# Patient Record
Sex: Male | Born: 1945 | Race: White | Hispanic: No | Marital: Married | Smoking: Former smoker
Health system: Southern US, Community
[De-identification: ages and names within clinical notes are randomized; demographics above are authoritative.]

## PROBLEM LIST (undated history)

## (undated) DIAGNOSIS — Z789 Other specified health status: Secondary | ICD-10-CM

## (undated) DIAGNOSIS — E119 Type 2 diabetes mellitus without complications: Secondary | ICD-10-CM

## (undated) DIAGNOSIS — N5089 Other specified disorders of the male genital organs: Secondary | ICD-10-CM

## (undated) DIAGNOSIS — Z87442 Personal history of urinary calculi: Secondary | ICD-10-CM

## (undated) DIAGNOSIS — Z859 Personal history of malignant neoplasm, unspecified: Secondary | ICD-10-CM

## (undated) DIAGNOSIS — K449 Diaphragmatic hernia without obstruction or gangrene: Secondary | ICD-10-CM

## (undated) DIAGNOSIS — M1990 Unspecified osteoarthritis, unspecified site: Secondary | ICD-10-CM

## (undated) DIAGNOSIS — R351 Nocturia: Secondary | ICD-10-CM

## (undated) DIAGNOSIS — T7840XA Allergy, unspecified, initial encounter: Secondary | ICD-10-CM

## (undated) DIAGNOSIS — I428 Other cardiomyopathies: Secondary | ICD-10-CM

## (undated) DIAGNOSIS — Z87898 Personal history of other specified conditions: Secondary | ICD-10-CM

## (undated) DIAGNOSIS — I10 Essential (primary) hypertension: Secondary | ICD-10-CM

## (undated) DIAGNOSIS — M722 Plantar fascial fibromatosis: Secondary | ICD-10-CM

## (undated) DIAGNOSIS — K219 Gastro-esophageal reflux disease without esophagitis: Secondary | ICD-10-CM

## (undated) DIAGNOSIS — J309 Allergic rhinitis, unspecified: Secondary | ICD-10-CM

## (undated) DIAGNOSIS — N200 Calculus of kidney: Secondary | ICD-10-CM

## (undated) DIAGNOSIS — I2510 Atherosclerotic heart disease of native coronary artery without angina pectoris: Secondary | ICD-10-CM

## (undated) DIAGNOSIS — E559 Vitamin D deficiency, unspecified: Secondary | ICD-10-CM

## (undated) DIAGNOSIS — E785 Hyperlipidemia, unspecified: Secondary | ICD-10-CM

## (undated) DIAGNOSIS — Z974 Presence of external hearing-aid: Secondary | ICD-10-CM

## (undated) DIAGNOSIS — Z9889 Other specified postprocedural states: Secondary | ICD-10-CM

## (undated) DIAGNOSIS — R55 Syncope and collapse: Secondary | ICD-10-CM

## (undated) DIAGNOSIS — Z973 Presence of spectacles and contact lenses: Secondary | ICD-10-CM

## (undated) HISTORY — DX: Unspecified osteoarthritis, unspecified site: M19.90

## (undated) HISTORY — DX: Allergy, unspecified, initial encounter: T78.40XA

## (undated) HISTORY — DX: Calculus of kidney: N20.0

## (undated) HISTORY — DX: Essential (primary) hypertension: I10

## (undated) HISTORY — PX: SHOULDER SURGERY: SHX246

## (undated) HISTORY — DX: Syncope and collapse: R55

## (undated) HISTORY — PX: TONSILLECTOMY AND ADENOIDECTOMY: SUR1326

## (undated) HISTORY — DX: Atherosclerotic heart disease of native coronary artery without angina pectoris: I25.10

## (undated) HISTORY — DX: Plantar fascial fibromatosis: M72.2

## (undated) HISTORY — PX: UPPER GASTROINTESTINAL ENDOSCOPY: SHX188

## (undated) HISTORY — PX: COLONOSCOPY: SHX174

## (undated) HISTORY — DX: Gastro-esophageal reflux disease without esophagitis: K21.9

## (undated) HISTORY — DX: Allergic rhinitis, unspecified: J30.9

## (undated) HISTORY — PX: OTHER SURGICAL HISTORY: SHX169

## (undated) HISTORY — PX: POLYPECTOMY: SHX149

## (undated) HISTORY — DX: Vitamin D deficiency, unspecified: E55.9

---

## 1999-05-17 ENCOUNTER — Ambulatory Visit (HOSPITAL_COMMUNITY): Admission: RE | Admit: 1999-05-17 | Discharge: 1999-05-17 | Payer: Self-pay | Admitting: Internal Medicine

## 1999-05-17 ENCOUNTER — Encounter: Payer: Self-pay | Admitting: Internal Medicine

## 1999-06-28 ENCOUNTER — Encounter (INDEPENDENT_AMBULATORY_CARE_PROVIDER_SITE_OTHER): Payer: Self-pay

## 1999-06-28 ENCOUNTER — Encounter (INDEPENDENT_AMBULATORY_CARE_PROVIDER_SITE_OTHER): Payer: Self-pay | Admitting: *Deleted

## 1999-06-28 ENCOUNTER — Ambulatory Visit (HOSPITAL_COMMUNITY): Admission: RE | Admit: 1999-06-28 | Discharge: 1999-06-28 | Payer: Self-pay | Admitting: *Deleted

## 2003-05-07 ENCOUNTER — Ambulatory Visit (HOSPITAL_COMMUNITY): Admission: RE | Admit: 2003-05-07 | Discharge: 2003-05-07 | Payer: Self-pay | Admitting: *Deleted

## 2003-05-07 ENCOUNTER — Encounter (INDEPENDENT_AMBULATORY_CARE_PROVIDER_SITE_OTHER): Payer: Self-pay | Admitting: *Deleted

## 2003-05-22 ENCOUNTER — Encounter: Admission: RE | Admit: 2003-05-22 | Discharge: 2003-06-05 | Payer: Self-pay | Admitting: General Surgery

## 2003-05-22 IMAGING — CR DG CHEST 2V
2 series · 2 of 2 positions shown · non-contrast
Comparison: None.

CLINICAL DATA: Villous adenoma of the rectum.  Pre-op evaluation.  

 TWO VIEW CHEST, [DATE]

[view not recorded (1 of 2)]
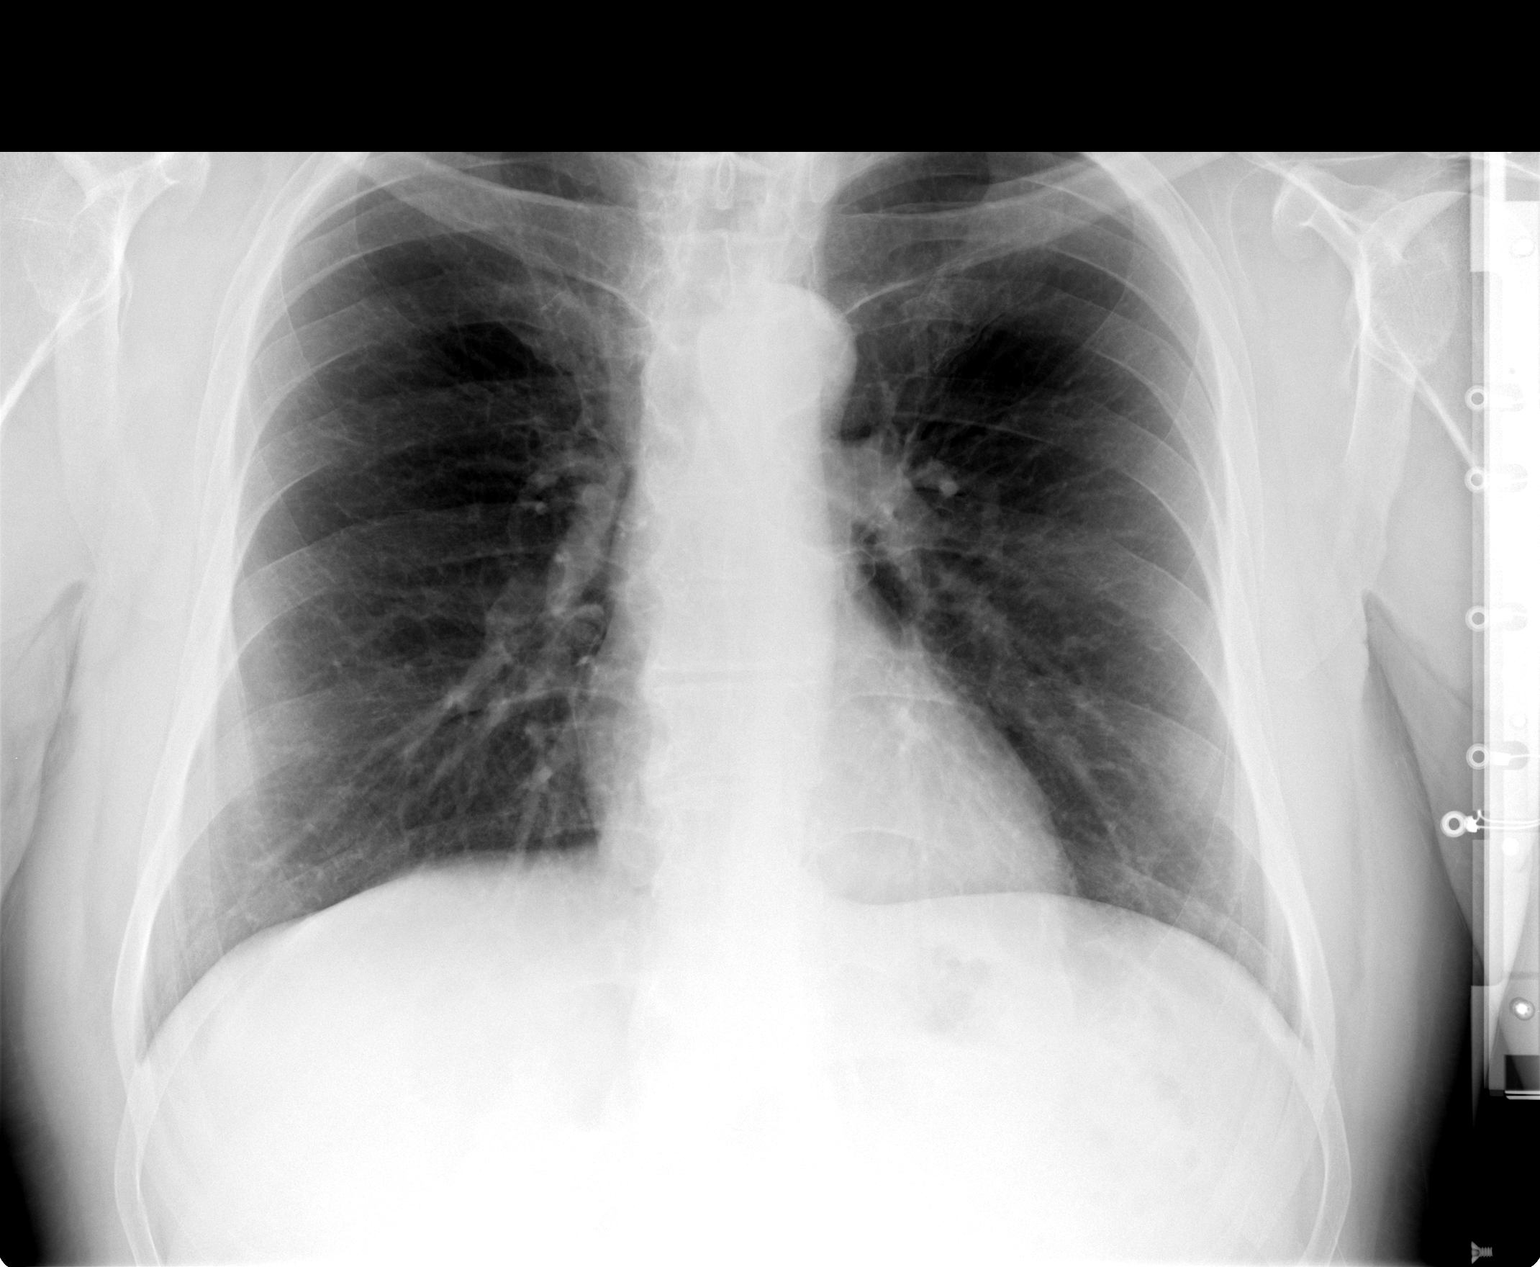

[view not recorded (2 of 2)]
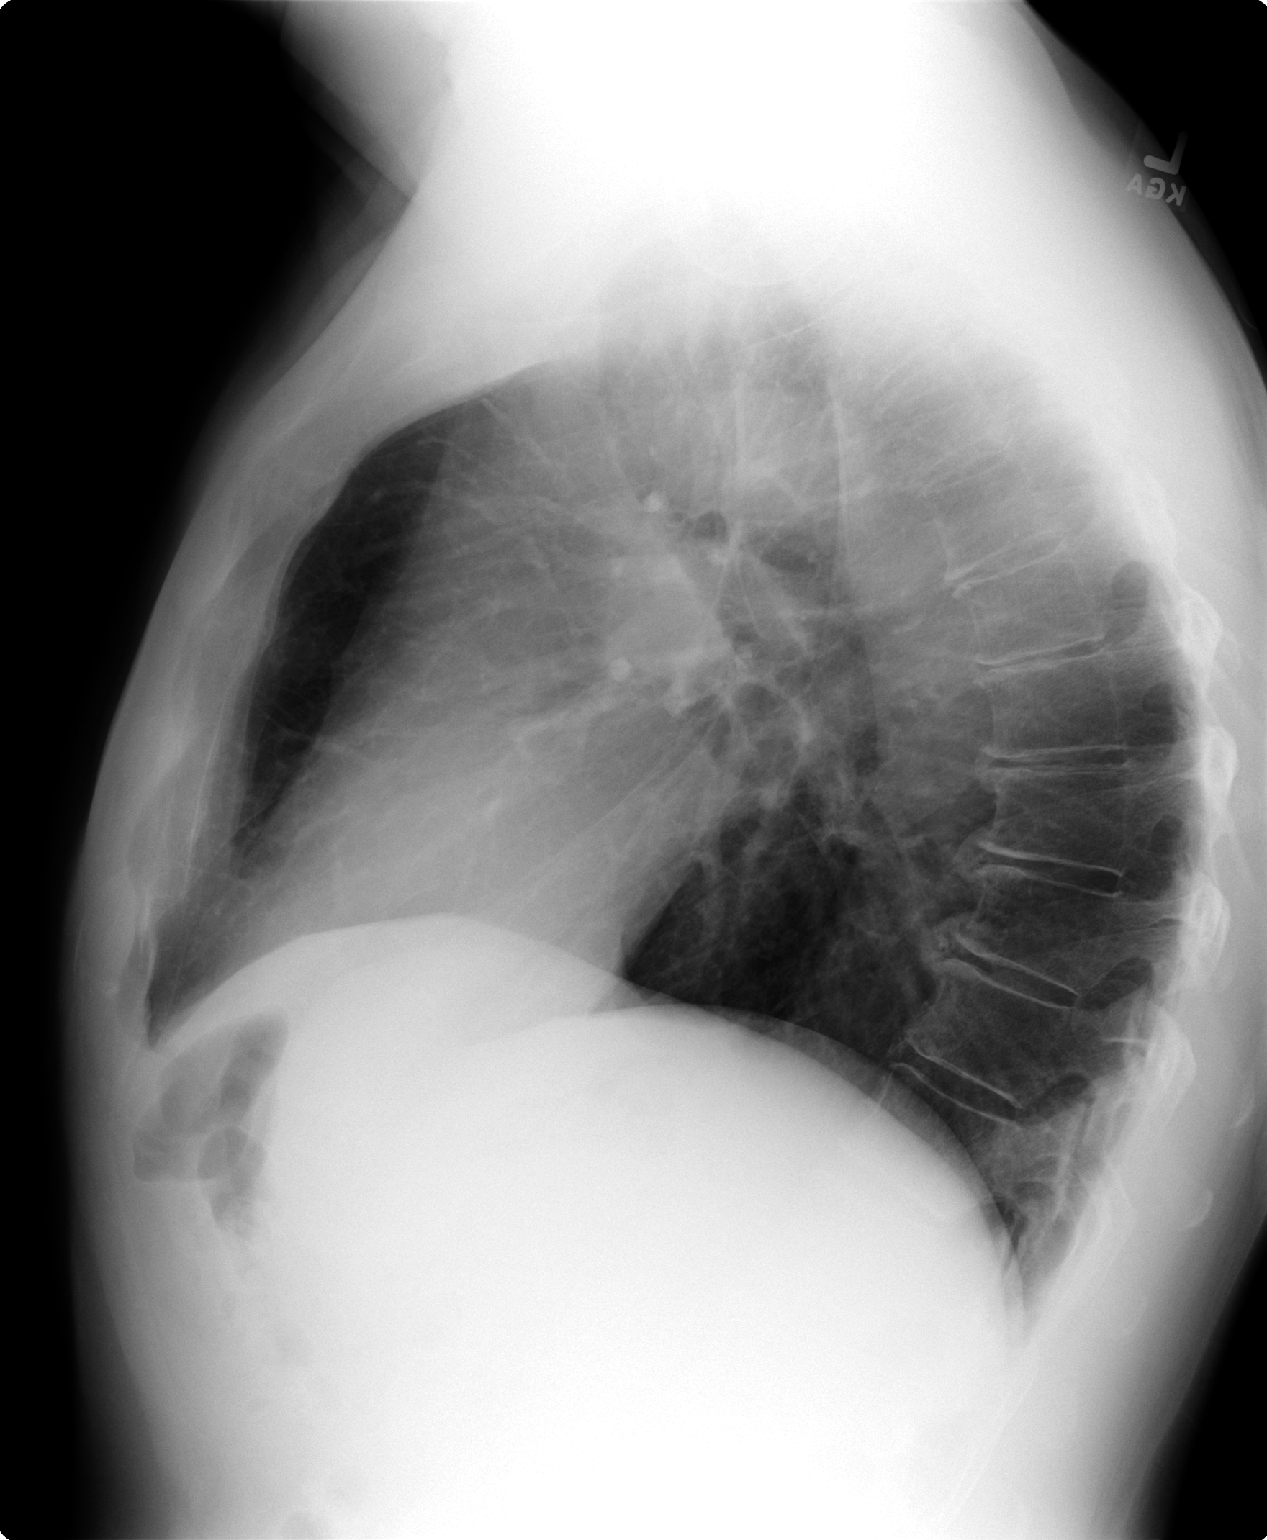

[2 of 2 positions shown; findings below may reference images not displayed]

FINDINGS: Two view exam of the chest shows no focal consolidation, edema, or pleural effusion.  The cardiopericardial silhouette is within normal limits for size.  The bony structures of the imaged thorax are intact. 

 IMPRESSION
 No evidence for acute cardiopulmonary process.  

 [REDACTED]

## 2003-05-25 HISTORY — PX: OTHER SURGICAL HISTORY: SHX169

## 2003-05-29 ENCOUNTER — Encounter (INDEPENDENT_AMBULATORY_CARE_PROVIDER_SITE_OTHER): Payer: Self-pay | Admitting: *Deleted

## 2003-05-29 ENCOUNTER — Encounter (INDEPENDENT_AMBULATORY_CARE_PROVIDER_SITE_OTHER): Payer: Self-pay | Admitting: Specialist

## 2003-05-29 ENCOUNTER — Inpatient Hospital Stay (HOSPITAL_COMMUNITY): Admission: RE | Admit: 2003-05-29 | Discharge: 2003-06-05 | Payer: Self-pay | Admitting: General Surgery

## 2004-04-16 ENCOUNTER — Encounter (INDEPENDENT_AMBULATORY_CARE_PROVIDER_SITE_OTHER): Payer: Self-pay | Admitting: Specialist

## 2004-04-16 ENCOUNTER — Encounter (INDEPENDENT_AMBULATORY_CARE_PROVIDER_SITE_OTHER): Payer: Self-pay | Admitting: *Deleted

## 2004-04-16 ENCOUNTER — Ambulatory Visit (HOSPITAL_COMMUNITY): Admission: RE | Admit: 2004-04-16 | Discharge: 2004-04-16 | Payer: Self-pay | Admitting: *Deleted

## 2005-07-27 ENCOUNTER — Emergency Department (HOSPITAL_COMMUNITY): Admission: EM | Admit: 2005-07-27 | Discharge: 2005-07-27 | Payer: Self-pay | Admitting: Emergency Medicine

## 2007-07-20 ENCOUNTER — Ambulatory Visit (HOSPITAL_COMMUNITY): Admission: RE | Admit: 2007-07-20 | Discharge: 2007-07-20 | Payer: Self-pay | Admitting: *Deleted

## 2007-07-20 ENCOUNTER — Encounter (INDEPENDENT_AMBULATORY_CARE_PROVIDER_SITE_OTHER): Payer: Self-pay | Admitting: *Deleted

## 2008-12-24 HISTORY — PX: OTHER SURGICAL HISTORY: SHX169

## 2009-10-02 ENCOUNTER — Encounter (INDEPENDENT_AMBULATORY_CARE_PROVIDER_SITE_OTHER): Payer: Self-pay | Admitting: *Deleted

## 2009-11-05 ENCOUNTER — Telehealth: Payer: Self-pay | Admitting: Gastroenterology

## 2010-02-23 NOTE — Op Note (Signed)
Summary: Upper Endoscopy and biopsy  NAME:  Roy [name], Roy [name]                 ACCOUNT NO.:  [id number]   MEDICAL RECORD NO.:  [id number]          PATIENT TYPE:  AMB   LOCATION:  ENDO                         FACILITY:  [facility]   PHYSICIAN:  Georgiana Spinner, M.D.    DATE OF BIRTH:  12-04-1945   DATE OF PROCEDURE:  04/16/2004  DATE OF DISCHARGE:                                 OPERATIVE REPORT   PROCEDURE PERFORMED:  Upper endoscopy.   INDICATIONS FOR PROCEDURE:  Abdominal pain, gastroesophageal reflux disease.   ANESTHESIA:  Demerol 60 mg. Versed 5 mg.   DESCRIPTION OF PROCEDURE:  With the patient mildly sedated in the left  lateral decubitus position, the Olympus video endoscope was inserted in the  mouth and passed under direct vision through the esophagus which appeared  normal.  There did not appear to be Barrett's esophagus but I sampled the  squamocolumnar junction with biopsies to verify that, entered into the  stomach through a hiatal hernia.  Fundus, body, antrum, duodenal bulb and  second portion of the duodenum were all visualized.  From this point, the  endoscope was slowly withdrawn taking circumferential views of the duodenal  mucosa until the endoscope had been pulled back in the stomach, placed on  retroflexion to view the stomach from below and once again, a large hiatal  hernia was seen and photographed.  The endoscope was straightened and  withdrawn taking circumferential views of remaining gastric and esophageal  mucosa.  Patient's vital signs and pulse oximeter remained stable.  The  patient tolerated the procedure well without apparent complications.   FINDINGS:  A large hiatal hernia with the squamocolumnar junction biopsied.  Await biopsy report.  The patient will call me for results and follow up  with me as an outpatient. Proceed to colonoscopy as planned.      GMO/MEDQ  D:  04/16/2004  T:  04/17/2004  Job:  [id number]       SP Surgical Pathology - STATUS:  Final             By: Threasa Beards  ,        Perform Date: 24Mar06 00:00  Ordered By: Jarvis Newcomer,            Ordered Date: [phone] 13:19  Facility: [facility]                              Department: CPATH  Service Report Text  [facility]   [address] [place], [place] [address]   [phone]    REPORT OF SURGICAL PATHOLOGY    Case #: [id number]   Patient Name: Roy [name], Roy [name].   PID: [id number]   Pathologist: Havery Moros, MD   DOB/Age Mar 07, 1945 (Age: 65) Gender: M   Date Taken: 04/16/2004   Date Received: 04/16/2004    FINAL DIAGNOSIS    ***MICROSCOPIC EXAMINATION AND DIAGNOSIS***    ESOPHAGOGASTRIC JUNCTION MUCOSA WITH MILD INFLAMMATION. NO   INTESTINAL  METAPLASIA, DYSPLASIA OR MALIGNANCY IDENTIFIED   (BIOPSY)    COMMENT   An Alcian Blue stain is performed to determine the presence of   intestinal metaplasia (goblet cell metaplasia). No intestinal   metaplasia (goblet cell metaplasia) is identified with the Alcian   Blue stain. The control stained appropriately. (BNS:jy) 04/19/04    jy   Date Reported: 04/19/2004 Havery Moros, MD   *** Electronically Signed Out By BNS ***    Clinical information   (hd)    specimen(s) obtained   Esophagogastric junction, biopsy    Gross Description   Received in formalin are tan, soft tissue fragments that are   submitted in toto. Number: Two   Size: 0.2 cm, submitted in one block (RL:glk 04-16-04)     gk/

## 2010-02-23 NOTE — Op Note (Signed)
Summary: Upper Endoscopy and biopsy                         [facility]  Patient:    Roy [name], Roy [name]                        MRN: [id number] Proc. Date: 06/28/99 Adm. Date:  [id number] Attending:  Sabino Gasser                           Procedure Report  PROCEDURE PERFORMED:  Upper endoscopy.  ENDOSCOPIST:  Sabino Gasser, M.D.  INDICATIONS FOR PROCEDURE:  Dysphagia.  ANESTHESIA:  Demerol 50 mg, Versed 5 mg was given intravenously in divided dose.  DESCRIPTION OF PROCEDURE:  With the patient mildly sedated in the left lateral decubitus position, the Olympus video endoscope was inserted in the mouth and passed under direct vision through the esophagus.  The distal esophagus was approached and showed changes of esophagitis grade 1.  There was some linear erythema with some ulceration and erosions seen in this area, question of Barretts esophagus and biopsies were taken.  We advanced further into a hiatal hernia sac and subsequently into the stomach where the antrum duodenal bulb and second portion of the duodenum were well-visualized and all appeared normal.  Photographs were taken.  From this point, the endoscope was slowly withdrawn taking circumferential views of the entire duodenal mucosa until the endoscope had been pulled back into the stomach and placed on retroflexion to view the stomach from below and once again, the hiatal hernia was seen and we could visualize the esophagus and retroflex view of the stomach indicating a lack of a complete wrap of the gastroesophageal junction around the endoscope. The endoscope was then straightened and pulled back from distal to proximal stomach taking circumferential views of the entire gastric and subsequently esophageal mucosa, which otherwise appeared normal.  Photographs were taken. Patients vital signs and pulse oximeter remained stable.  The patient tolerated the procedure well without apparent complications.  FINDINGS:  Changes  of esophagitis.  The patient states that he did well on Aciphex and less well on ranitidine.  Will switch him over to the Aciphex. Will have him call me for results of biopsy and follow up with me as an outpatient. DD:  06/28/99 TD:  06/30/99 Job: [id number]       SP Surgical Pathology - STATUS: Final             By: Guilford Shi MD , Clovis Pu       Perform Date: 4 Jun01 07:10  Ordered By: Jarvis Newcomer,            Ordered Date:  Facility: [facility]                              Department: CPATH  Service Report Text  [facility]   [address] [place], [place] [address]   [phone]    REPORT OF SURGICAL PATHOLOGY    Case #: [id number]   Patient Name: Roy [name], Roy [name].   PID: [id number]   Pathologist: Marcie Bal, MD   DOB/Age [birth year] (Age: 65) Gender: M   Date Taken: 06/28/1999   Date Received: 06/28/1999    FINAL DIAGNOSIS    ***MICROSCOPIC EXAMINATION AND DIAGNOSIS***  ESOPHAGEAL BIOPSY: BENIGN SQUAMOUS AND COLUMNAR MUCOSA WITH   CHANGES CONSISTENT WITH REFLUX ESOPHAGITIS; NO INTESTINAL   METAPLASIA OR DYSPLASIA IDENTIFIED.    COMMENT   No intestinal metaplasia is identified with Alcian Blue stain.   The control stained appropriately. The glandular mucosa is noted   and only one level of sectioning does not show intestinal   metaplasia. There is papillary lengthening and basal cell   hyperplasia consistent with reflux esophagitis.    smr   Date Reported: 06/29/1999 Marcie Bal, MD   *** Electronically Signed Out By TAZ ***    Clinical information   R/O Barrett' s. (tmc)    specimen(s) obtained   Esophagus, biopsy    Gross Description   Received in formalin are tan, soft tissue fragments that are   submitted in toto. Number: 3   Size: 0.1 to 0.3 cm (JBM:smr 6/4)    smr/

## 2010-02-23 NOTE — Op Note (Signed)
Summary: Colonoscopy  NAME:  Roy [name], Roy [name]                 ACCOUNT NO.:  [id number]      MEDICAL RECORD NO.:  [id number]          PATIENT TYPE:  AMB      LOCATION:  ENDO                         FACILITY:  [facility]      PHYSICIAN:  Georgiana Spinner, M.D.    DATE OF BIRTH:  May 03, 1945      DATE OF PROCEDURE:   DATE OF DISCHARGE:                                  OPERATIVE REPORT      PROCEDURE:  Colonoscopy.      INDICATIONS:  Follow-up of colon polyp resection.      ANESTHESIA:  Fentanyl 75 mcg, Versed 5 mg.      PROCEDURE:  With the patient in the left lateral decubitus position a   rectal examination was performed.  Anal pressures were moderate.   Subsequently the patient was sedated and the Pentax videoscopic   colonoscope was inserted in the rectum and passed under direct vision to   the cecum identified by ileocecal valve and appendiceal orifice, which   was widely patent.  From this point the colonoscope was slowly withdrawn   taking circumferential views of colonic mucosa stopping only in the   rectum which appeared normal on direct other than surgical changes noted   and showed large hemorrhoids on retroflexed view.  The endoscope was   straightened and withdrawn.  The patient's vital signs, pulse oximeter   remained stable.  The patient tolerated the procedure well without   apparent complications.      FINDINGS:  Large internal hemorrhoids otherwise an unremarkable exam.      PLAN:   1. We will have patient follow-up with me to discuss his incontinence.   2. We will recommend Kegel exercises as first impression.                  ______________________________   Georgiana Spinner, M.D.            GMO/MEDQ  D:  07/20/2007  T:  07/20/2007  Job:  [id number]      cc:   Angelia Mould. Derrell Lolling, M.D.   [address]   [place]   [place] [address]

## 2010-02-23 NOTE — Op Note (Signed)
Summary: Colonoscopy and biopsy  NAME:  Roy [name], Roy [name]                           ACCOUNT NO.:  [id number]   MEDICAL RECORD NO.:  [id number]                   PATIENT TYPE:  AMB   LOCATION:  ENDO                                 FACILITY[facility]  [facility]   PHYSICIAN:  Georgiana Spinner, M.D.                 DATE OF BIRTH:  [birth year]   DATE OF PROCEDURE:  DATE OF DISCHARGE:                                 OPERATIVE REPORT   PROCEDURE:  Colonoscopy.   INDICATIONS FOR PROCEDURE:  Rectal bleeding, colon polyp.   ANESTHESIA:  Demerol 25 mg.   DESCRIPTION OF PROCEDURE:  With the patient mildly sedated in the left  lateral decubitus position, the Olympus videoscopic colonoscope was inserted  in the rectum after a normal rectal exam and passed under direct vision to  the cecum identified by crow's foot of the cecum and ileocecal valve both of  which were photographed. From this point, the colonoscope was slowly  withdrawn taking circumferential views of the colonic mucosa stopping at 15  cm from the anal verge at which point a multilobulated mass was seen,  photographed and multiple biopsies taken of the presumed malignancy.  Certainly there is probably some components of a villous adenoma in this as  well.  After biopsies were taken, the endoscope was withdrawn to the rectum  which appeared normal on direct and retroflexed view.  The endoscope was  straightened and withdrawn. The patient's vital signs and pulse oximeter  remained stable. The patient tolerated the procedure well without apparent  complications.   FINDINGS:  Multilobulated mass in the area of 15 cm from the anal verge,  await biopsy report but clearly this will need to be resected surgically.   PLAN:  Await biopsy report. The patient will call me for results and  followup with me as an outpatient.                                               Georgiana Spinner, M.D.    GMO/MEDQ  D:  05/07/2003  T:  05/07/2003  Job:   [id number]         SP Surgical Pathology - STATUS: Final             By: Almyra Free MD , Malcolm Metro        Perform Date: 13Apr05 00:00  Ordered By: Jarvis Newcomer,            Ordered Date: 13Apr05 15:59  Facility: [facility]                              Department: CPATH  Service Report Text  [facility]   [address]  [address]   [place], [place] [address]   [phone]    REPORT OF SURGICAL PATHOLOGY    Case #: [id number]   Patient Name: Roy [name], Roy [name].   PID: [id number]   Pathologist: Cira Rue L. Almyra Free, MD   DOB/Age 04/24/1945 (Age: 65) Gender: M   Date Taken: 05/07/2003   Date Received: 05/07/2003    FINAL DIAGNOSIS    ***MICROSCOPIC EXAMINATION AND DIAGNOSIS***    1. ENDOSCOPIC BIOPSY, ESOPHAGUS: EROSIVE ESOPHAGITIS WITH   REACTIVE ATYPIA, SEE COMMENT.    2. ENDOSCOPIC BIOPSY, STOMACH, POLYP: FUNDIC GLAND POLYP. SEE   COMMENT.    3. ENDOSCOPIC BIOPSY, COLON AT 15 CM: MULTIPLE FRAGMENTS OF   TUBULOVILLOUS ADENOMA,   NO EVIDENCE OF HIGH GRADE DYSPLASIA OR MALIGNANCY.    COMMENT   1. An area of erosion is present with associated acute   inflammation and reactive/regenerative atypia. No features of   malignancy are identified. Cardia-type gastric mucosa is present   and does not demonstrate features of intestinal metaplasia. The   lack of intestinal metaplasia is confirmed with an Alcian blue   stain with appropriate controls. No definitive viral changes are   identified. A PAS stain for fungus will be performed and the   results will be issued in the form of an addendum.    2.A Warthin-Starry stain is performed to determine the   possibility of the presence of Helicobacter pylori. The   Warthin-Starry stain is negative for organisms of Helicobacter   pylori. The control(s) stained appropriately. (ALL:jy 05/08/03    jy   Date Reported: 05/08/2003 Arlene L. Almyra Free, MD   *** Electronically Signed Out By ALL ***    Clinical information   (ac)    specimen(s)  obtained   1: Esophagus, biopsy   2: Stomach, polyp(s)   3: Colon, biopsy, @ 15 cm    Gross Description   1. Received in formalin are tan, soft tissue fragments that are   submitted in toto. Number: 3   Size: 0.3 to 0.5 cm, one cassette    2. Received in formalin are tan, soft tissue fragments that are   submitted in toto. Number: 3   Size: 0.2 to 0.3 cm, one cassette    3. Received in formalin are tan, soft tissue fragments that are   submitted in toto. Number: multiple   Size: 0.2 to 0.4 cm, one cassette (JBM:caf 05/07/03)    cf/   Addendum    Addendum Comment   1. A PAS stain for fungus is performed and is negative. Minimal   tissue is present on deeper levels cut for PAS stain. (ALL:gt)   05/09/03    Arlene L. Almyra Free, MD    DATE REPORTED:05/09/2003

## 2010-02-23 NOTE — Progress Notes (Signed)
Summary: Pre-visit paperwork  Phone Note Call from Patient Call back at [phone]   Caller: Patient Call For: Dr. Russella Dar Reason for Call: Talk to Nurse Summary of Call: Calling about pre-visit paperwork Initial call taken by: Karna Christmas,  November 05, 2009 9:28 AM  Follow-up for Phone Call        left message for pt  to call back  Follow-up by: Christie Nottingham CMA Duncan Dull),  November 05, 2009 9:56 AM  Additional Follow-up for Phone Call Additional follow up Details #1::        Told pt we recieved records from Dr. Wende Neighbors office and will have Dr. Russella Dar to review before previsit and colonoscopy to make sure this is the appropriate recall for him. Pt agreed and records were put on your desk but also copied and pasted from e-chart what I could for procedures. 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 If this current colonoscopy is for surveillance I would recommend a 3 year interval,  in June 2012, for his next colonoscopy. If he has GI symptoms or FOBT + stool or something else we can proceed now. Follow-up by: Meryl Dare MD Clementeen Graham,  November 05, 2009 1:05 PM  Additional Follow-up for Phone Call Additional follow up Details #3:: Details for Additional Follow-up Action Taken: Pt notified of Dr. Ardell Isaacs recommendations and pt denies any GI symptoms at this time. Previsit and procedure cancelled. Reminder recall put in the system for 06/2010 Additional Follow-up by: Christie Nottingham CMA Duncan Dull),  November 05, 2009 1:48 PM

## 2010-02-23 NOTE — Op Note (Signed)
Summary: Colonoscopy  NAME:  Roy [name], Roy [name]                 ACCOUNT NO.:  [id number]   MEDICAL RECORD NO.:  [id number]          PATIENT TYPE:  AMB   LOCATION:  ENDO                         FACILITY:  [facility]   PHYSICIAN:  Georgiana Spinner, M.D.    DATE OF BIRTH:  07/05/1945   DATE OF PROCEDURE:  04/16/2004  DATE OF DISCHARGE:                                 OPERATIVE REPORT   PROCEDURE PERFORMED:  Colonoscopy.   INDICATIONS FOR PROCEDURE:  Colon polyp resected last year.   ANESTHESIA:  Demerol 20 mg, Versed 2 mg.   DESCRIPTION OF PROCEDURE:  With the patient mildly sedated in the left  lateral decubitus position, a rectal exam was performed which was  unremarkable.  Subsequently the Olympus videoscopic colonoscope was inserted  in the rectum and passed under direct vision to the cecum, identified by the  ileocecal valve and appendiceal orifice, both of which were photographed.  The prep was good.  From this point, the colonoscope was slowly, taking  circumferential views of the colonic mucosa, stopping only in the rectum  which appeared normal on direct and showed a hemorrhoid on retroflex view.  The endoscope was straightened and withdrawn.  The patient's vital signs and  pulse oximeter remained stable.  The patient tolerated the procedure well  without apparent complications.   FINDINGS:  Internal hemorrhoid.  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   PLAN:  Will have patient follow up with me in approximately five years or as  needed.      GMO/MEDQ  D:  04/16/2004  T:  04/17/2004  Job:  [id number]   cc:   Angelia Mould. Derrell Lolling, M.D.  [address]  [place]  [place] [address]

## 2010-02-23 NOTE — Op Note (Signed)
Summary: Low anterior resection of rectum with coloproctostomy   NAME:  EBONY, RICKEL                           ACCOUNT NO.:  [id number]   MEDICAL RECORD NO.:  [id number]                   PATIENT TYPE:  INP   LOCATION:  0451                                 FACILITY:  [facility]   PHYSICIAN:  Angelia Mould. Derrell Lolling, M.D.             DATE OF BIRTH:  [birth year]   DATE OF PROCEDURE:  05/29/2003  DATE OF DISCHARGE:                                 OPERATIVE REPORT   PREOPERATIVE DIAGNOSIS:  Neoplastic polyp of the mid rectum.   POSTOPERATIVE DIAGNOSIS:  Neoplastic polyp of the mid rectum.   OPERATION/PROCEDURE:  Low anterior resection of rectum with coloproctostomy.   SURGEON:  Angelia Mould. Derrell Lolling, M.D.   FIRST ASSISTANT:  Abigail Miyamoto, M.D.   OPERATIVE INDICATIONS:  This is a 65 year old white man who had a screening  colonoscopy and a neoplastic-appearing polyp was found at 15 cm from the  anal verge.  This was multilobulated and there was concern that this is  malignant.  The biopsy showed villous adenoma.  This turned out to be on the  posterior wall extending around to be right wall.  No other abnormalities  were found.  He was advised to undergo a segmental resection of this and he  is brought to the operating room electively having undergone a bowel prep at  home.   OPERATIVE FINDINGS:  The patient did have a neoplastic-appearing mass on the  posterior wall and the right side of the rectal wall at about 15 cm.  This  was very subtle but it was palpable.  We were able to get a good margin  distally and proximally.  There was no sign of any tumor or cancer elsewhere  in the abdomen.  Liver felt normal.  Spleen felt normal.  Omentum felt  normal.  Retroperitoneum and small bowel felt normal.   DESCRIPTION OF PROCEDURE:  Following the induction of general endotracheal  anesthesia, the patient's abdomen and perineum were prepped and draped in  the sterile fashion.  A proctoscopy was  carried out and I identified the  tumor at 15 cm and I injected [place] ink in four quadrants.  I then gowned  and gloved and the abdomen and perineum were prepped once again.   A lower midline incision was made.  The abdomen was entered and explored  with findings as described above.  Self-retaining retractors were placed.  I  mobilized the sigmoid colon by dividing the lateral peritoneal attachments  all the way down into the pelvis.  I identified the left ureter easily and  that was preserved.  I did identify the [place] ink just at the peritoneal  reflection.  I was not sure whether I could feel a tumor at this point,  however.  I chose to transect the distal sigmoid colon with a GIA stapling  device.  The mesentery was widely taken  down all the way back up to near the  origin of the inferior mesenteric artery.  Dissection was carried down to  the pelvis on both sides.  We identified and preserved the right ureter as  well.  Dissection was carried around laterally.  We isolated the superior  hemorrhoidal vessels, clamped these, divided these, and doubly ligated these  with 2-0 silk ties.  We used the Harmonic dissection, electrocautery and  blunt dissection and took the dissection down into the hollow of the sacrum.  We got into the proper avascular plane and this dissection went well.  We  continued our dissection on the right and left lateral, dividing the lateral  stalks with the Harmonic scalpel.  We carried the incision of the peritoneum  all the way around anteriorly and then elevated a bladder flap.  We then  felt like we were probably below the tumor, but chose to insert proctoscope  into the proximal end of the specimen and looked directly at the tumor and  placed a suture immediately over the tumor because palpation was not that  good.  We cleaned off the mesentery of the mid rectum using the Harmonic  scalpel and flet like we were a good 4 cm below the tumor at least.  We used   a curved Ethicon TA-55 stapler with the blue staple load and we passed this  easily onto the mid to distal rectum, about 4 cm distal to the tumor.  We  closed the stapler, fired it and released it and removed it.  Specimen was  sent to the lab.  We could palpate the tumor well above the distal margin.   We mobilized the proximal sigmoid colon nicely.  As we judged how it would  go down to the pelvis, there was tethering on the distal end and so we had  to resect about 3-4 cm of sigmoid colon further with the GIA stapling  device.  After we did that, the sigmoid colon went down into the pelvis  without any tethering whatsoever.  There was no tension at all.  We opened  up the proximal colon by dividing the staple line.  We found that we could  easily pass a 29 mm sizer through this.  We made a pursestring of 2-0  Prolene all the way around the colon.  We inserted the anvil of a 29 mm EEA  stapler into the proximal colon and tied this pursestring suture.  This  looked very good after cleaning off some of the pericolonic fat.   Dr. Magnus Ivan went down into the perineum and passed the 29 mm EEA stapler up  through the rectum and we both visualized the stapler coming into the rectal  stump.  He then opened the stapler and the spike passed through the end of  the rectal stump.  We checked the proximal colon and made sure there was no  twisting or torquing and we then secured the proximal colon and the anvil  onto the stapler.  We very carefully closed the stapler, fired it, opened  it, and removed it.  We examined the donuts and they were complete within  the stapler.  We sent a distal donut as the distal margin.   Dr. Magnus Ivan performed proctoscopy and visualized the staple line  circumferentially.  It was intact and there was no bleeding.  We placed  saline in the pelvis and there was no air bubbles or leak whatsoever.  We  evacuated all  the air from the rectum.  I then changed gowns, gloves  and instruments.  The abdomen and pelvis were  irrigated with saline.  There was no bleeding.  The small bowel and  mesentery were returned to their anatomic positions.  All the laparotomy  pads were removed and the sponge count was correct.  Midline fascia was  closed with a running suture of #1 PDS and the skin closed with skin  staples.  Kling bandages were placed and the patient was taken to the  recovery room in stable condition.  Estimated blood loss was about 300-350  mL.  Complications were none.  Sponge, needle and instrument counts were  correct.                                               Angelia Mould. Derrell Lolling, M.D.    HMI/MEDQ  D:  05/29/2003  T:  05/29/2003  Job:  [id number]   cc:   Janae Bridgeman. Eloise Harman., M.D.  [address]  [place]  [place] [address]  Fax: [phone]   Georgiana Spinner, M.D.  [address]  [place]  [place] [address]  Fax: [phone]            SP Surgical Pathology - STATUS: Final             By: Windy Kalata,      Perform Date: 5 May05 00:00  Ordered By: Lauretta Chester,        Ordered Date: 5 May05 15:36  Facility: [facility]                              Department: CPATH  Service Report Text  [facility]   [address] [place], [place] [address]   [phone]    REPORT OF SURGICAL PATHOLOGY    Case #: [id number]   Patient Name: QURAN, VASCO.   PID: [id number]   Pathologist: Lyn Hollingshead. Delila Spence, MD   DOB/Age [birth year] (Age: 20) Gender: M   Date Taken: 05/29/2003   Date Received: 05/29/2003    FINAL DIAGNOSIS    ***MICROSCOPIC EXAMINATION AND DIAGNOSIS***    1. RECTUM, RESECTION:   - SESSILE TUBULOVILLOUS ADENOMATOUS POLYP (3.0 CM IN   GREATEST DIMENSION).   - NO HIGH GRADE DYSPLASIA OR INVASIVE MALIGNANCY   IDENTIFIED.   - MARGINS OF RESECTION FREE OF TUMOR/ADENOMA.   - TWELVE PERIRECTAL LYMPHOIDS FREE OF TUMOR.    2. COLON, PROXIMAL, RESECTION MARGIN: BENIGN COLONIC  MUCOSA.   NO TUMOR/ADENOMA IDENTIFIED.    3. COLON, DISTAL, RESECTION MARGIN: BENIGN COLONIC MUCOSA. NO   ADENOMA/TUMOR IDENTIFIED.    cf   Date Reported: 06/02/2003 Alden Server A. Delila Spence, MD   *** Electronically Signed Out By EAA ***    Clinical information   Villous adenoma of rectum. (ac)    specimen(s) obtained   1: Rectum, resection, neoplasm   2: Colon, resection margin (donut), proximal   3: Colon, resection margin (donut), distal    Gross Description   1. Specimen: Colon including rectum with peritoneal reflection   located 4cm from the distal margin. The distal margin is marked   with a suture.   Length: 18cm   Serosa: Smooth and hyperemic  Tumor location: The tumor is located at the level of peritoneal   reflexion.   Tumor size (cm): Measures 3 x 2.5 x 1.2cm and consist of a   sessile, firm, tan-red polypoid mass.   % Circumference involved by tumor: 50%   Depth of invasion: Grossly appears to be superficial and does not   involve the underlying muscularis.   Margins: proximal- 13cm; distal-2.5cm; radial- 3cm   Other mucosal lesions: Uninvolved mucosa is glistening and tan.   Lymph nodes: There are fifteen tan-red ovoid nodules tentatively   identified as lymph nodes measuring 0.2 to 0.7cm in greatest   dimension.   Block summary: nine blocks submitted.   A= proximal margin   B= distal margin   CF= tumor   G,H,I= lymph nodes.    2. Received in formalin is a 4cm segment of colon. The mucosa is   glistening and tan. The margins are stapled and the specimen is   not oriented. The mucosa is glistening and tan. Sections of the   margins of resection are submitted in two cassettes.    3. Received in formalin is a 1.5 x 1 x 0.6cm ring of tan mucosa   and subjacent tissue. sections are submitted in one cassette.   (GP:gt) 05/30/03    gdt/

## 2010-02-23 NOTE — Letter (Signed)
Summary: Pre Visit Letter Revised  [facility]  [address] [place], [place] [address]   Phone: [phone]  Fax: [phone]        10/02/2009 MRN: [id number] Roy [name] [address] [place], [place]  [address]             Procedure Date:  11-24-09  Welcome to the Gastroenterology Division at [facility].    You are scheduled to see a nurse for your pre-procedure visit on 11-09-09 at 10:30a.m. on the 3rd floor at [facility], [address]  We ask that you try to arrive at our office 15 minutes prior to your appointment time to allow for check-in.  Please take a minute to review the attached form.  If you answer "Yes" to one or more of the questions on the first page, we ask that you call the person listed at your earliest opportunity.  If you answer "No" to all of the questions, please complete the rest of the form and bring it to your appointment.    Your nurse visit will consist of discussing your medical and surgical history, your immediate family medical history, and your medications.   If you are unable to list all of your medications on the form, please bring the medication bottles to your appointment and we will list them.  We will need to be aware of both prescribed and over the counter drugs.  We will need to know exact dosage information as well.    Please be prepared to read and sign documents such as consent forms, a financial agreement, and acknowledgement forms.  If necessary, and with your consent, a friend or relative is welcome to sit-in on the nurse visit with you.  Please bring your insurance card so that we may make a copy of it.  If your insurance requires a referral to see a specialist, please bring your referral form from your primary care physician.  No co-pay is required for this nurse visit.     If you cannot keep your appointment, please call [phone] to cancel or reschedule prior to your appointment date.  This allows [place]  the opportunity to schedule an appointment for another patient in need of care.    Thank you for choosing [facility] for your medical needs.  We appreciate the opportunity to care for you.  Please visit [place] at our website  to learn more about our practice.  Sincerely, The Gastroenterology Division

## 2010-02-23 NOTE — Op Note (Signed)
Summary: Upper Endoscopy and biopsy  NAME:  Roy [name], Roy [name]                           ACCOUNT NO.:  [id number]   MEDICAL RECORD NO.:  [id number]                   PATIENT TYPE:  AMB   LOCATION:  ENDO                                 FACILITY:  [facility]   PHYSICIAN:  Georgiana Spinner, M.D.                 DATE OF BIRTH:  1946/01/22   DATE OF PROCEDURE:  05/07/2003  DATE OF DISCHARGE:                                 OPERATIVE REPORT   PROCEDURE:  Upper endoscopy with biopsy.   INDICATIONS FOR PROCEDURE:  Gastroesophageal reflux disease.   ANESTHESIA:  Demerol 50, Versed 7.5 mg.   DESCRIPTION OF PROCEDURE:  With the patient mildly sedated in the left  lateral decubitus position, the Olympus videoscopic endoscope was inserted  in the mouth and passed under direct vision through the esophagus which  appeared normal until we reached the distal esophagus and he had rather  florid esophagitis probably class 1-2 with distinct areas of erythema and  erosions that were photographed and biopsied. We entered into the stomach  through a hiatal hernia. The fundus, body, antrum were visualized. We  entered into the duodenal bulb through the pylorus which was somewhat  eccentrically located and there was a mild case of duodenitis photographed.  The second portion of the duodenum appeared normal. From this point, the  endoscope was slowly withdrawn taking circumferential views of the duodenal  mucosa until the endoscope was then pulled back into the stomach, it had  been placed in retroflexion to view the stomach from below. The endoscope  was then straightened and withdrawn taking circumferential views of the  remaining gastric and esophageal mucosa stopping in the body of the stomach  where a number of small polyps were seen.  One was photographed and a number  of them were sampled by biopsy.  The endoscope was then withdrawn taking  circumferential views of the remaining gastric and esophageal mucosa.   The  patient's vital signs and pulse oximeter remained stable. The patient  tolerated the procedure well without apparent complications.   FINDINGS:  Esophagitis photographed and biopsied, a number of gastric polyps  photographed and biopsied with some duodenitis and a CLO biopsy had been  taken. The patient's vital signs and pulse oximeter remained stable.  The  patient tolerated the procedure well without apparent complications.   Changes of esophagitis, gastric polyps, duodenitis.   PLAN:  Await biopsy report.  The patient will call me for results and  followup with me as an outpatient.  Proceed to colonoscopy as planned.                                               Georgiana Spinner, M.D.    GMO/MEDQ  D:  05/07/2003  T:  05/07/2003  Job:  [id number]        SP Surgical Pathology - STATUS: Final             By: Almyra Free MD , Malcolm Metro        Perform Date: 13Apr05 00:00  Ordered By: Jarvis Newcomer,            Ordered Date: 13Apr05 15:59  Facility: [facility]                              Department: CPATH  Service Report Text  [facility]   [address] [place], [place] [address]   [phone]    REPORT OF SURGICAL PATHOLOGY    Case #: [id number]   Patient Name: Roy [name], Roy [name].   PID: [id number]   Pathologist: Cira Rue L. Almyra Free, MD   DOB/Age [birth year] (Age: 65) Gender: M   Date Taken: 05/07/2003   Date Received: 05/07/2003    FINAL DIAGNOSIS    ***MICROSCOPIC EXAMINATION AND DIAGNOSIS***    1. ENDOSCOPIC BIOPSY, ESOPHAGUS: EROSIVE ESOPHAGITIS WITH   REACTIVE ATYPIA, SEE COMMENT.    2. ENDOSCOPIC BIOPSY, STOMACH, POLYP: FUNDIC GLAND POLYP. SEE   COMMENT.    3. ENDOSCOPIC BIOPSY, COLON AT 15 CM: MULTIPLE FRAGMENTS OF   TUBULOVILLOUS ADENOMA,   NO EVIDENCE OF HIGH GRADE DYSPLASIA OR MALIGNANCY.    COMMENT   1. An area of erosion is present with associated acute   inflammation and reactive/regenerative atypia. No features of   malignancy are  identified. Cardia-type gastric mucosa is present   and does not demonstrate features of intestinal metaplasia. The   lack of intestinal metaplasia is confirmed with an Alcian blue   stain with appropriate controls. No definitive viral changes are   identified. A PAS stain for fungus will be performed and the   results will be issued in the form of an addendum.    2.A Warthin-Starry stain is performed to determine the   possibility of the presence of Helicobacter pylori. The   Warthin-Starry stain is negative for organisms of Helicobacter   pylori. The control(s) stained appropriately. (ALL:jy 05/08/03    jy   Date Reported: 05/08/2003 Arlene L. Almyra Free, MD   *** Electronically Signed Out By ALL ***    Clinical information   (ac)    specimen(s) obtained   1: Esophagus, biopsy   2: Stomach, polyp(s)   3: Colon, biopsy, @ 15 cm    Gross Description   1. Received in formalin are tan, soft tissue fragments that are   submitted in toto. Number: 3   Size: 0.3 to 0.5 cm, one cassette    2. Received in formalin are tan, soft tissue fragments that are   submitted in toto. Number: 3   Size: 0.2 to 0.3 cm, one cassette    3. Received in formalin are tan, soft tissue fragments that are   submitted in toto. Number: multiple   Size: 0.2 to 0.4 cm, one cassette (JBM:caf 05/07/03)    cf/   Addendum    Addendum Comment   1. A PAS stain for fungus is performed and is negative. Minimal   tissue is present on deeper levels cut for PAS stain. (ALL:gt)   05/09/03    Arlene L. Almyra Free, MD    DATE REPORTED:05/09/2003

## 2010-06-08 NOTE — Op Note (Signed)
Roy [name], Roy [name]                 ACCOUNT NO.:  [id number]   MEDICAL RECORD NO.:  [id number]          PATIENT TYPE:  AMB   LOCATION:  ENDO                         FACILITY:  [facility]   PHYSICIAN:  Georgiana Spinner, M.D.    DATE OF BIRTH:  03-19-1945   DATE OF PROCEDURE:  DATE OF DISCHARGE:                               OPERATIVE REPORT   PROCEDURE:  Colonoscopy.   INDICATIONS:  Follow-up of colon polyp resection.   ANESTHESIA:  Fentanyl 75 mcg, Versed 5 mg.   PROCEDURE:  With the patient in the left lateral decubitus position a  rectal examination was performed.  Anal pressures were moderate.  Subsequently the patient was sedated and the Pentax videoscopic  colonoscope was inserted in the rectum and passed under direct vision to  the cecum identified by ileocecal valve and appendiceal orifice, which  was widely patent.  From this point the colonoscope was slowly withdrawn  taking circumferential views of colonic mucosa stopping only in the  rectum which appeared normal on direct other than surgical changes noted  and showed large hemorrhoids on retroflexed view.  The endoscope was  straightened and withdrawn.  The patient's vital signs, pulse oximeter  remained stable.  The patient tolerated the procedure well without  apparent complications.   FINDINGS:  Large internal hemorrhoids otherwise an unremarkable exam.   PLAN:  1. We will have patient follow-up with me to discuss his incontinence.  2. We will recommend Kegel exercises as first impression.           ______________________________  Georgiana Spinner, M.D.     GMO/MEDQ  D:  07/20/2007  T:  07/20/2007  Job:  [id number]   cc:   Angelia Mould. Derrell Lolling, M.D.  [address], [address]  [place]  [place] [address]

## 2010-06-11 NOTE — Op Note (Signed)
NAME:  Roy [name], Roy [name]                           ACCOUNT NO.:  [id number]   MEDICAL RECORD NO.:  [id number]                   PATIENT TYPE:  INP   LOCATION:  0451                                 FACILITY:  [facility]   PHYSICIAN:  Angelia Mould. Derrell Lolling, M.D.             DATE OF BIRTH:  [birth year]   DATE OF PROCEDURE:  05/29/2003  DATE OF DISCHARGE:                                 OPERATIVE REPORT   PREOPERATIVE DIAGNOSIS:  Neoplastic polyp of the mid rectum.   POSTOPERATIVE DIAGNOSIS:  Neoplastic polyp of the mid rectum.   OPERATION/PROCEDURE:  Low anterior resection of rectum with coloproctostomy.   SURGEON:  Angelia Mould. Derrell Lolling, M.D.   FIRST ASSISTANT:  Abigail Miyamoto, M.D.   OPERATIVE INDICATIONS:  This is a 65 year old white man who had a screening  colonoscopy and a neoplastic-appearing polyp was found at 15 cm from the  anal verge.  This was multilobulated and there was concern that this is  malignant.  The biopsy showed villous adenoma.  This turned out to be on the  posterior wall extending around to be right wall.  No other abnormalities  were found.  He was advised to undergo a segmental resection of this and he  is brought to the operating room electively having undergone a bowel prep at  home.   OPERATIVE FINDINGS:  The patient did have a neoplastic-appearing mass on the  posterior wall and the right side of the rectal wall at about 15 cm.  This  was very subtle but it was palpable.  We were able to get a good margin  distally and proximally.  There was no sign of any tumor or cancer elsewhere  in the abdomen.  Liver felt normal.  Spleen felt normal.  Omentum felt  normal.  Retroperitoneum and small bowel felt normal.   DESCRIPTION OF PROCEDURE:  Following the induction of general endotracheal  anesthesia, the patient's abdomen and perineum were prepped and draped in  the sterile fashion.  A proctoscopy was carried out and I identified the  tumor at 15 cm and I injected [place]  ink in four quadrants.  I then gowned  and gloved and the abdomen and perineum were prepped once again.   A lower midline incision was made.  The abdomen was entered and explored  with findings as described above.  Self-retaining retractors were placed.  I  mobilized the sigmoid colon by dividing the lateral peritoneal attachments  all the way down into the pelvis.  I identified the left ureter easily and  that was preserved.  I did identify the [place] ink just at the peritoneal  reflection.  I was not sure whether I could feel a tumor at this point,  however.  I chose to transect the distal sigmoid colon with a GIA stapling  device.  The mesentery was widely taken down all the way back up to near the  origin of the inferior mesenteric artery.  Dissection was carried down to  the pelvis on both sides.  We identified and preserved the right ureter as  well.  Dissection was carried around laterally.  We isolated the superior  hemorrhoidal vessels, clamped these, divided these, and doubly ligated these  with 2-0 silk ties.  We used the Harmonic dissection, electrocautery and  blunt dissection and took the dissection down into the hollow of the sacrum.  We got into the proper avascular plane and this dissection went well.  We  continued our dissection on the right and left lateral, dividing the lateral  stalks with the Harmonic scalpel.  We carried the incision of the peritoneum  all the way around anteriorly and then elevated a bladder flap.  We then  felt like we were probably below the tumor, but chose to insert proctoscope  into the proximal end of the specimen and looked directly at the tumor and  placed a suture immediately over the tumor because palpation was not that  good.  We cleaned off the mesentery of the mid rectum using the Harmonic  scalpel and flet like we were a good 4 cm below the tumor at least.  We used  a curved Ethicon TA-55 stapler with the blue staple load and we passed  this  easily onto the mid to distal rectum, about 4 cm distal to the tumor.  We  closed the stapler, fired it and released it and removed it.  Specimen was  sent to the lab.  We could palpate the tumor well above the distal margin.   We mobilized the proximal sigmoid colon nicely.  As we judged how it would  go down to the pelvis, there was tethering on the distal end and so we had  to resect about 3-4 cm of sigmoid colon further with the GIA stapling  device.  After we did that, the sigmoid colon went down into the pelvis  without any tethering whatsoever.  There was no tension at all.  We opened  up the proximal colon by dividing the staple line.  We found that we could  easily pass a 29 mm sizer through this.  We made a pursestring of 2-0  Prolene all the way around the colon.  We inserted the anvil of a 29 mm EEA  stapler into the proximal colon and tied this pursestring suture.  This  looked very good after cleaning off some of the pericolonic fat.   Dr. Magnus Ivan went down into the perineum and passed the 29 mm EEA stapler up  through the rectum and we both visualized the stapler coming into the rectal  stump.  He then opened the stapler and the spike passed through the end of  the rectal stump.  We checked the proximal colon and made sure there was no  twisting or torquing and we then secured the proximal colon and the anvil  onto the stapler.  We very carefully closed the stapler, fired it, opened  it, and removed it.  We examined the donuts and they were complete within  the stapler.  We sent a distal donut as the distal margin.   Dr. Magnus Ivan performed proctoscopy and visualized the staple line  circumferentially.  It was intact and there was no bleeding.  We placed  saline in the pelvis and there was no air bubbles or leak whatsoever.  We  evacuated all the air from the rectum.  I then changed gowns,  gloves and instruments.  The abdomen and pelvis were  irrigated with saline.   There was no bleeding.  The small bowel and  mesentery were returned to their anatomic positions.  All the laparotomy  pads were removed and the sponge count was correct.  Midline fascia was  closed with a running suture of #1 PDS and the skin closed with skin  staples.  Kling bandages were placed and the patient was taken to the  recovery room in stable condition.  Estimated blood loss was about 300-350  mL.  Complications were none.  Sponge, needle and instrument counts were  correct.                                               Angelia Mould. Derrell Lolling, M.D.    HMI/MEDQ  D:  05/29/2003  T:  05/29/2003  Job:  [id number]   cc:   Janae Bridgeman. Eloise Harman., M.D.  [address]  [place]  [place] [address]  Fax: [phone]   Georgiana Spinner, M.D.  [address]  [place]  [place] [address]  Fax: [phone]

## 2010-06-11 NOTE — Discharge Summary (Signed)
NAME:  Roy [name], Roy [name]                           ACCOUNT NO.:  [id number]   MEDICAL RECORD NO.:  [id number]                   PATIENT TYPE:  INP   LOCATION:  0451                                 FACILITY:  [facility]   PHYSICIAN:  Angelia Mould. Derrell Lolling, M.D.             DATE OF BIRTH:  1945-09-05   DATE OF ADMISSION:  05/29/2003  DATE OF DISCHARGE:  06/05/2003                                 DISCHARGE SUMMARY   FINAL DIAGNOSES:  1. Villous adenoma of the rectum.  2. Hypertension.  3. History of esophagitis.   OPERATION PERFORMED:  Low anterior resection of the rectum with a 29-mm EEA  stapler, May 29, 2003.   HISTORY:  This is a 65 year old white man who had a screening colostomy. He  was asymptomatic. Dr. Virginia Rochester found a large villous adenoma at 15-cm which was  multilobulated. There was concern that this was malignant. Pathology report  showed no villous adenoma, but no dysplasia. It was felt to be too large to  resect endoscopically. Segmental resection was advised and the patient  agreed to this. He underwent a bowel prep at home and was admitted  electively. He was counseled regarding the [place] drug study and he was in  agreement and registered for that study.   PHYSICAL EXAMINATION:  GENERAL: Pleasant middle-age gentleman in no  distress.  VITAL SIGNS: Weight 205 pounds, height 6 feet 1 inch, pulse 81, blood  pressure 144/87.  NECK: No adenopathy or mass.  LUNGS: Clear to auscultation.  HEART: Regular rate and rhythm with no murmur. All pulses were palpable.  ABDOMEN: Soft, scaphoid, and nontender with no masses or hernias.   HOSPITAL COURSE:  On the day of admission the patient was taken to the  operating room and underwent a low anterior resection of the rectum. We did  tattoo this with [place] ink arthroscopically immediately preoperatively which  was helpful. The anastomosis was created with a 29-mm EEA stapler.  Postoperatively, the patient did well. He received study drug under  the  supervision of the clinical research nurse. He progressed slowly and  steadily with his diet and activities. He was placed on a solid diet on  postoperative day two and seemed to tolerate that fairly well. He began  passing flatus early on, but did not have a bowel movement until Jun 04, 2003. After he had two soft bowel movements and felt well. He wanted to go  home and discharge was allowed on Jun 05, 2003. At that time he was feeling  well, had had the bowel movements, had no cramps, and he was afebrile. His  abdomen was scaphoid, soft, and nontender. The wound looked fine. Staples  were removed and Steri-Strips were applied. He was advised to return to see  me in the office in eight to ten days. He was given a prescription for  Vicodin for pain and Colace for stool softeners, and  asked to continue his  usual medications.   ADDENDUM:  The final pathology report showed a villous adenoma, 3 cm in  greatest dimension. Margins of resection were clean. All lymph nodes were  negative.                                               Angelia Mould. Derrell Lolling, M.D.    HMI/MEDQ  D:  06/16/2003  T:  06/16/2003  Job:  [id number]   cc:   Georgiana Spinner, M.D.  [address]  [place]  [place] [address]  Fax: [phone]   Janae Bridgeman. Eloise Harman., M.D.  [address]  [place]  [place] [address]  Fax: [phone]

## 2010-06-11 NOTE — Op Note (Signed)
[name], Roy [name]                 ACCOUNT NO.:  [id number]   MEDICAL RECORD NO.:  [id number]          PATIENT TYPE:  AMB   LOCATION:  ENDO                         FACILITY:  [facility]   PHYSICIAN:  Georgiana Spinner, M.D.    DATE OF BIRTH:  07/13/1945   DATE OF PROCEDURE:  04/16/2004  DATE OF DISCHARGE:                                 OPERATIVE REPORT   PROCEDURE PERFORMED:  Upper endoscopy.   INDICATIONS FOR PROCEDURE:  Abdominal pain, gastroesophageal reflux disease.   ANESTHESIA:  Demerol 60 mg. Versed 5 mg.   DESCRIPTION OF PROCEDURE:  With the patient mildly sedated in the left  lateral decubitus position, the Olympus video endoscope was inserted in the  mouth and passed under direct vision through the esophagus which appeared  normal.  There did not appear to be Barrett's esophagus but I sampled the  squamocolumnar junction with biopsies to verify that, entered into the  stomach through a hiatal hernia.  Fundus, body, antrum, duodenal bulb and  second portion of the duodenum were all visualized.  From this point, the  endoscope was slowly withdrawn taking circumferential views of the duodenal  mucosa until the endoscope had been pulled back in the stomach, placed on  retroflexion to view the stomach from below and once again, a large hiatal  hernia was seen and photographed.  The endoscope was straightened and  withdrawn taking circumferential views of remaining gastric and esophageal  mucosa.  Patient's vital signs and pulse oximeter remained stable.  The  patient tolerated the procedure well without apparent complications.   FINDINGS:  A large hiatal hernia with the squamocolumnar junction biopsied.  Await biopsy report.  The patient will call me for results and follow up  with me as an outpatient. Proceed to colonoscopy as planned.      GMO/MEDQ  D:  04/16/2004  T:  04/17/2004  Job:  [id number]

## 2010-06-11 NOTE — Op Note (Signed)
NAME:  Roy [name], Roy [name]                           ACCOUNT NO.:  [id number]   MEDICAL RECORD NO.:  [id number]                   PATIENT TYPE:  AMB   LOCATION:  ENDO                                 FACILITY:  [facility]   PHYSICIAN:  Georgiana Spinner, M.D.                 DATE OF BIRTH:  [birth year]   DATE OF PROCEDURE:  05/07/2003  DATE OF DISCHARGE:                                 OPERATIVE REPORT   PROCEDURE:  Upper endoscopy with biopsy.   INDICATIONS FOR PROCEDURE:  Gastroesophageal reflux disease.   ANESTHESIA:  Demerol 50, Versed 7.5 mg.   DESCRIPTION OF PROCEDURE:  With the patient mildly sedated in the left  lateral decubitus position, the Olympus videoscopic endoscope was inserted  in the mouth and passed under direct vision through the esophagus which  appeared normal until we reached the distal esophagus and he had rather  florid esophagitis probably class 1-2 with distinct areas of erythema and  erosions that were photographed and biopsied. We entered into the stomach  through a hiatal hernia. The fundus, body, antrum were visualized. We  entered into the duodenal bulb through the pylorus which was somewhat  eccentrically located and there was a mild case of duodenitis photographed.  The second portion of the duodenum appeared normal. From this point, the  endoscope was slowly withdrawn taking circumferential views of the duodenal  mucosa until the endoscope was then pulled back into the stomach, it had  been placed in retroflexion to view the stomach from below. The endoscope  was then straightened and withdrawn taking circumferential views of the  remaining gastric and esophageal mucosa stopping in the body of the stomach  where a number of small polyps were seen.  One was photographed and a number  of them were sampled by biopsy.  The endoscope was then withdrawn taking  circumferential views of the remaining gastric and esophageal mucosa.  The  patient's vital signs and pulse  oximeter remained stable. The patient  tolerated the procedure well without apparent complications.   FINDINGS:  Esophagitis photographed and biopsied, a number of gastric polyps  photographed and biopsied with some duodenitis and a CLO biopsy had been  taken. The patient's vital signs and pulse oximeter remained stable.  The  patient tolerated the procedure well without apparent complications.   Changes of esophagitis, gastric polyps, duodenitis.   PLAN:  Await biopsy report.  The patient will call me for results and  followup with me as an outpatient.  Proceed to colonoscopy as planned.                                               Georgiana Spinner, M.D.    GMO/MEDQ  D:  05/07/2003  T:  05/07/2003  Job:  [id number] 

## 2010-06-11 NOTE — Op Note (Signed)
NAME:  Roy [name], Roy [name]                           ACCOUNT NO.:  [id number]   MEDICAL RECORD NO.:  [id number]                   PATIENT TYPE:  AMB   LOCATION:  ENDO                                 FACILITY:  [facility]   PHYSICIAN:  Georgiana Spinner, M.D.                 DATE OF BIRTH:  July 07, 1945   DATE OF PROCEDURE:  DATE OF DISCHARGE:                                 OPERATIVE REPORT   PROCEDURE:  Colonoscopy.   INDICATIONS FOR PROCEDURE:  Rectal bleeding, colon polyp.   ANESTHESIA:  Demerol 25 mg.   DESCRIPTION OF PROCEDURE:  With the patient mildly sedated in the left  lateral decubitus position, the Olympus videoscopic colonoscope was inserted  in the rectum after a normal rectal exam and passed under direct vision to  the cecum identified by crow's foot of the cecum and ileocecal valve both of  which were photographed. From this point, the colonoscope was slowly  withdrawn taking circumferential views of the colonic mucosa stopping at 15  cm from the anal verge at which point a multilobulated mass was seen,  photographed and multiple biopsies taken of the presumed malignancy.  Certainly there is probably some components of a villous adenoma in this as  well.  After biopsies were taken, the endoscope was withdrawn to the rectum  which appeared normal on direct and retroflexed view.  The endoscope was  straightened and withdrawn. The patient's vital signs and pulse oximeter  remained stable. The patient tolerated the procedure well without apparent  complications.   FINDINGS:  Multilobulated mass in the area of 15 cm from the anal verge,  await biopsy report but clearly this will need to be resected surgically.   PLAN:  Await biopsy report. The patient will call me for results and  followup with me as an outpatient.                                               Georgiana Spinner, M.D.    GMO/MEDQ  D:  05/07/2003  T:  05/07/2003  Job:  [id number]

## 2010-06-11 NOTE — Op Note (Signed)
Roy [name], Roy [name]                 ACCOUNT NO.:  [id number]   MEDICAL RECORD NO.:  [id number]          PATIENT TYPE:  AMB   LOCATION:  ENDO                         FACILITY:  [facility]   PHYSICIAN:  Georgiana Spinner, M.D.    DATE OF BIRTH:  [birth year]   DATE OF PROCEDURE:  04/16/2004  DATE OF DISCHARGE:                                 OPERATIVE REPORT   PROCEDURE PERFORMED:  Colonoscopy.   INDICATIONS FOR PROCEDURE:  Colon polyp resected last year.   ANESTHESIA:  Demerol 20 mg, Versed 2 mg.   DESCRIPTION OF PROCEDURE:  With the patient mildly sedated in the left  lateral decubitus position, a rectal exam was performed which was  unremarkable.  Subsequently the Olympus videoscopic colonoscope was inserted  in the rectum and passed under direct vision to the cecum, identified by the  ileocecal valve and appendiceal orifice, both of which were photographed.  The prep was good.  From this point, the colonoscope was slowly, taking  circumferential views of the colonic mucosa, stopping only in the rectum  which appeared normal on direct and showed a hemorrhoid on retroflex view.  The endoscope was straightened and withdrawn.  The patient's vital signs and  pulse oximeter remained stable.  The patient tolerated the procedure well  without apparent complications.   FINDINGS:  Internal hemorrhoid.  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   PLAN:  Will have patient follow up with me in approximately five years or as  needed.      GMO/MEDQ  D:  04/16/2004  T:  04/17/2004  Job:  [id number]   cc:   Angelia Mould. Derrell Lolling, M.D.  [address]  [place]  [place] [address]

## 2010-06-11 NOTE — Procedures (Signed)
[facility]  Patient:    Roy [name], Roy [name]                        MRN: [id number] Proc. Date: 06/28/99 Adm. Date:  [id number] Attending:  Sabino Gasser                           Procedure Report  PROCEDURE PERFORMED:  Upper endoscopy.  ENDOSCOPIST:  Sabino Gasser, M.D.  INDICATIONS FOR PROCEDURE:  Dysphagia.  ANESTHESIA:  Demerol 50 mg, Versed 5 mg was given intravenously in divided dose.  DESCRIPTION OF PROCEDURE:  With the patient mildly sedated in the left lateral decubitus position, the Olympus video endoscope was inserted in the mouth and passed under direct vision through the esophagus.  The distal esophagus was approached and showed changes of esophagitis grade 1.  There was some linear erythema with some ulceration and erosions seen in this area, question of Barretts esophagus and biopsies were taken.  We advanced further into a hiatal hernia sac and subsequently into the stomach where the antrum duodenal bulb and second portion of the duodenum were well-visualized and all appeared normal.  Photographs were taken.  From this point, the endoscope was slowly withdrawn taking circumferential views of the entire duodenal mucosa until the endoscope had been pulled back into the stomach and placed on retroflexion to view the stomach from below and once again, the hiatal hernia was seen and we could visualize the esophagus and retroflex view of the stomach indicating a lack of a complete wrap of the gastroesophageal junction around the endoscope. The endoscope was then straightened and pulled back from distal to proximal stomach taking circumferential views of the entire gastric and subsequently esophageal mucosa, which otherwise appeared normal.  Photographs were taken. Patients vital signs and pulse oximeter remained stable.  The patient tolerated the procedure well without apparent complications.  FINDINGS:  Changes of esophagitis.  The patient states  that he did well on Aciphex and less well on ranitidine.  Will switch him over to the Aciphex. Will have him call me for results of biopsy and follow up with me as an outpatient. DD:  06/28/99 TD:  06/30/99 Job: [id number]

## 2011-09-11 ENCOUNTER — Encounter (HOSPITAL_COMMUNITY): Payer: Self-pay | Admitting: Emergency Medicine

## 2011-09-11 ENCOUNTER — Emergency Department (HOSPITAL_COMMUNITY)
Admission: EM | Admit: 2011-09-11 | Discharge: 2011-09-11 | Disposition: A | Discharge status: Home / Self Care | Payer: Medicare Other | Attending: Emergency Medicine | Admitting: Emergency Medicine

## 2011-09-11 DIAGNOSIS — R55 Syncope and collapse: Secondary | ICD-10-CM

## 2011-09-11 DIAGNOSIS — E785 Hyperlipidemia, unspecified: Secondary | ICD-10-CM | POA: Insufficient documentation

## 2011-09-11 DIAGNOSIS — E119 Type 2 diabetes mellitus without complications: Secondary | ICD-10-CM | POA: Insufficient documentation

## 2011-09-11 DIAGNOSIS — R404 Transient alteration of awareness: Secondary | ICD-10-CM | POA: Insufficient documentation

## 2011-09-11 HISTORY — DX: Hyperlipidemia, unspecified: E78.5

## 2011-09-11 LAB — OCCULT BLOOD, POC DEVICE: Fecal Occult Bld: NEGATIVE

## 2011-09-11 NOTE — ED Notes (Signed)
[place] became dizzy diaphoretic, and then had a syncopal episode. Denies any kind of pain. Was unconscious for approx. 1 min. States he has been feeling fine all day. Hx of HTN, DM, high cholesterol, BPH, GERD. Upon arrival 150/90, hr 80, NSR on monitor. Initially refused transport. Patient orthostatic. After standing patient became diaphoretic and dizzy. Nausea, vomited x1. 18 R AC. Non smoker, recreational drinker. Denies elicit drug use.

## 2011-09-11 NOTE — ED Provider Notes (Signed)
History     CSN: [id number]  Arrival date & time 09/11/11  2042   First MD Initiated Contact with Patient 09/11/11 2050      Chief Complaint  Patient presents with  . Loss of Consciousness    (Consider location/radiation/quality/duration/timing/severity/associated sxs/prior treatment) HPI Comments: Patient presents due to a syncopal event.  He reports that he has had vasovagal type near syncopal episodes twice in the past usually associated with being in the heat for to long.  Patient reports he [place] for an after hours tour. He reports toward the end of the to her, the group was watching the lines eating and he began to feel lightheaded and dizzy. He retreated to the back of the area and leaned against a railing. He continued to feel more lightheaded and then the next thing he knows he was being lowered to the ground by bystanders. He denies any pain from a fall. He reports he did not have any chest pain, back pain, abdominal pain, headache, shortness of breath. He apparently did drink some water and was transported by EMS. He was facing backwards in a mill and struck and was on a lot of winding road coming out of the zoo area and he did become nauseated and threw up one time. He reports it was liquid with some yellowish material. There is no blood in it. He reports after vomiting he felt much improved. He continues to feel well now and reports that he feels completely back to normal. He denies any prior history of heart disease, stroke. He reports he did eat today and did have fluids today as well. He denies any recent black or tarry stools. He reports he did have a polyp removed surgically many years ago and he does carefully watch his stools and has not seen any evidence of any bleeding.   Patient is a 66 y.o. male presenting with syncope. The history is provided by the patient.  Loss of Consciousness Pertinent negatives include no chest pain,  no abdominal pain, no headaches and no shortness of breath.    Past Medical History  Diagnosis Date  . Diabetes mellitus   . Hyperlipidemia     No past surgical history on file.  No family history on file.  History  Substance Use Topics  . Smoking status: Not on file  . Smokeless tobacco: Not on file  . Alcohol Use:       Review of Systems  Constitutional: Negative for fever and chills.  HENT: Negative for congestion.   Respiratory: Negative for chest tightness and shortness of breath.   Cardiovascular: Positive for syncope. Negative for chest pain.  Gastrointestinal: Positive for nausea and vomiting. Negative for abdominal pain, diarrhea, constipation and blood in stool.  Genitourinary: Negative for flank pain.  Musculoskeletal: Negative for back pain.  Skin: Negative for color change and rash.  Neurological: Positive for dizziness, syncope and light-headedness. Negative for headaches.  All other systems reviewed and are negative.    Allergies  Review of patient's allergies indicates no known allergies.  Home Medications   Current Outpatient Rx  Name Route Sig Dispense Refill  . VITAMIN D 1000 UNITS PO TABS Oral Take 1,000 Units by mouth daily.    . OMEGA-3 FATTY ACIDS 1000 MG PO CAPS Oral Take 1 g by mouth daily.    [name] GLUCOSAMINE-CHONDROITIN 500-400 MG PO TABS Oral Take 1 tablet by mouth daily.    [name]  METFORMIN HCL 1000 MG PO TABS Oral Take 1,000 mg by mouth daily with breakfast.    . ADULT MULTIVITAMIN LIQUID CH Oral Take 5 mLs by mouth daily.    [name] OMEPRAZOLE 20 MG PO CPDR Oral Take 20 mg by mouth daily.    [name] PRAVASTATIN SODIUM 40 MG PO TABS Oral Take 80 mg by mouth at bedtime.    [name] PRESCRIPTION MEDICATION Oral Take 1 tablet by mouth daily. For prostate      BP 126/79  Pulse 76  Temp 97.6 F (36.4 C) (Oral)  Resp 14  SpO2 100%  Physical Exam  Nursing note and vitals reviewed. Constitutional: He is oriented to person, place, and time. He appears  well-developed and well-nourished.  HENT:  Head: Normocephalic and atraumatic.  Eyes: Pupils are equal, round, and reactive to light. No scleral icterus.  Neck: Normal range of motion. Neck supple.  Cardiovascular: Normal rate and regular rhythm.   No murmur heard. Pulmonary/Chest: Effort normal. No respiratory distress.  Abdominal: Soft. He exhibits no distension. There is no tenderness. There is no rebound and no guarding.  Neurological: He is alert and oriented to person, place, and time.  Skin: Skin is warm and dry. No rash noted. No pallor.    ED Course  Procedures (including critical care time)  Labs Reviewed - No data to display No results found.   1. Vasovagal syncope     Room air saturation is 100% which I interpret to be normal.  EKG performed at time 21:00, shows sinus rhythm at rate of 81. Left axis deviation is noted. 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 Criteria for LVH is noted. No acute ST or T-wave abnormalities. Similar appearance to EKG from 07/27/2005 when he had a similar syncopal episode as well.  MDM   Patient expresses desire to go home. He reports he feels very well at this time. He is reassured by the fact that he has had similar episodes in the past. He does note that he is warm and quite humid outside when this occurred. His EKG is unchanged and had no complaints of any pain. He did have a prodrome suggesting that this was more of a vasovagal episode. He did have a bowel movement here and it was hemocculted which was negative. Plan is to allow him to be discharged home and he can followup with his primary care physician this week.        Gavin Pound. Oletta Lamas, MD 09/11/11 2144

## 2011-09-11 NOTE — Discharge Instructions (Signed)
 Syncope You have had a fainting (syncopal) spell. A fainting episode is a sudden, short-lived loss of consciousness. It results in complete recovery. It occurs because there has been a temporary shortage of oxygen and/or sugar (glucose) to the brain. CAUSES   Blood pressure pills and other medications that may lower blood pressure below normal. Sudden changes in posture (sudden standing).   Over-medication. Take your medications as directed.   Standing too long. This can cause blood to pool in the legs.   Seizure disorders.   Low blood sugar (hypoglycemia) of diabetes. This more commonly causes coma.   Bearing down to go to the bathroom. This can cause your blood pressure to rise suddenly. Your body compensates by making the blood pressure too low when you stop bearing down.   Hardening of the arteries where the brain temporarily does not receive enough blood.   Irregular heart beat and circulatory problems.   Fear, emotional distress, injury, sight of blood, or illness.  Your caregiver will send you home if the syncope was from non-worrisome causes (benign). Depending on your age and health, you may stay to be monitored and observed. If you return home, have someone stay with you if your caregiver feels that is desirable. It is very important to keep all follow-up referrals and appointments in order to properly manage this condition. This is a serious problem which can lead to serious illness and death if not carefully managed.  WARNING: Do not drive or operate machinery until your caregiver feels that it is safe for you to do so. SEEK IMMEDIATE MEDICAL CARE IF:   You have another fainting episode or faint while lying or sitting down. DO NOT DRIVE YOURSELF. Call 911 if no other help is available.   You have chest pain, are feeling sick to your stomach (nausea), vomiting or abdominal pain.   You have an irregular heartbeat or one that is very fast (pulse over 120 beats per minute).    You have a loss of feeling in some part of your body or lose movement in your arms or legs.   You have difficulty with speech, confusion, severe weakness, or visual problems.   You become sweaty and/or feel light headed.  Make sure you are rechecked as instructed. Document Released: 01/10/2005 Document Revised: 12/30/2010 Document Reviewed: 08/31/2006 [facility] Patient Information 2012 [place], [place].

## 2011-09-11 NOTE — ED Notes (Signed)
EMS placed IV removed.

## 2012-04-17 ENCOUNTER — Other Ambulatory Visit: Payer: Self-pay

## 2012-09-28 ENCOUNTER — Telehealth: Payer: Self-pay | Admitting: Gastroenterology

## 2012-09-28 NOTE — Telephone Encounter (Signed)
Telephone note reviewed from 11/05/2009.  Patient is due to a colonoscopy for 2012.  He needs a pre-visit and a colonoscopy.  No answer, I will continue to try and reach the patient

## 2012-10-01 NOTE — Telephone Encounter (Signed)
Spoke to patient he will c/b to schedule once he looks at his schedule.

## 2013-03-12 ENCOUNTER — Emergency Department (HOSPITAL_COMMUNITY)
Admission: EM | Admit: 2013-03-12 | Discharge: 2013-03-12 | Disposition: A | Discharge status: Home / Self Care | Payer: Medicare Other | Attending: Emergency Medicine | Admitting: Emergency Medicine

## 2013-03-12 ENCOUNTER — Encounter (HOSPITAL_COMMUNITY): Payer: Self-pay | Admitting: Emergency Medicine

## 2013-03-12 DIAGNOSIS — R109 Unspecified abdominal pain: Secondary | ICD-10-CM | POA: Insufficient documentation

## 2013-03-12 DIAGNOSIS — Z79899 Other long term (current) drug therapy: Secondary | ICD-10-CM | POA: Insufficient documentation

## 2013-03-12 DIAGNOSIS — E785 Hyperlipidemia, unspecified: Secondary | ICD-10-CM | POA: Insufficient documentation

## 2013-03-12 DIAGNOSIS — E119 Type 2 diabetes mellitus without complications: Secondary | ICD-10-CM | POA: Insufficient documentation

## 2013-03-12 DIAGNOSIS — M549 Dorsalgia, unspecified: Secondary | ICD-10-CM

## 2013-03-12 DIAGNOSIS — M545 Low back pain, unspecified: Secondary | ICD-10-CM | POA: Insufficient documentation

## 2013-03-12 DIAGNOSIS — R350 Frequency of micturition: Secondary | ICD-10-CM | POA: Insufficient documentation

## 2013-03-12 DIAGNOSIS — Z7982 Long term (current) use of aspirin: Secondary | ICD-10-CM | POA: Insufficient documentation

## 2013-03-12 DIAGNOSIS — R110 Nausea: Secondary | ICD-10-CM | POA: Insufficient documentation

## 2013-03-12 DIAGNOSIS — R32 Unspecified urinary incontinence: Secondary | ICD-10-CM | POA: Insufficient documentation

## 2013-03-12 DIAGNOSIS — R319 Hematuria, unspecified: Secondary | ICD-10-CM

## 2013-03-12 LAB — URINALYSIS, ROUTINE W REFLEX MICROSCOPIC
BILIRUBIN URINE: NEGATIVE
GLUCOSE, UA: 100 mg/dL — AB
Ketones, ur: 40 mg/dL — AB
Leukocytes, UA: NEGATIVE
Nitrite: NEGATIVE
Protein, ur: NEGATIVE mg/dL
SPECIFIC GRAVITY, URINE: 1.02 (ref 1.005–1.030)
UROBILINOGEN UA: 1 mg/dL (ref 0.0–1.0)
pH: 7 (ref 5.0–8.0)

## 2013-03-12 LAB — BASIC METABOLIC PANEL
BUN: 15 mg/dL (ref 6–23)
CHLORIDE: 103 meq/L (ref 96–112)
CO2: 22 mEq/L (ref 19–32)
Calcium: 9.5 mg/dL (ref 8.4–10.5)
Creatinine, Ser: 0.95 mg/dL (ref 0.50–1.35)
GFR calc non Af Amer: 83 mL/min — ABNORMAL LOW (ref >90)
GLUCOSE: 168 mg/dL — AB (ref 70–99)
POTASSIUM: 4.3 meq/L (ref 3.7–5.3)
SODIUM: 142 meq/L (ref 137–147)

## 2013-03-12 LAB — CBC WITH DIFFERENTIAL/PLATELET
BASOS PCT: 0 % (ref 0–1)
Basophils Absolute: 0 10*3/uL (ref 0.0–0.1)
Eosinophils Absolute: 0 10*3/uL (ref 0.0–0.7)
Eosinophils Relative: 0 % (ref 0–5)
HEMATOCRIT: 40.8 % (ref 39.0–52.0)
HEMOGLOBIN: 13.9 g/dL (ref 13.0–17.0)
LYMPHS ABS: 0.6 10*3/uL — AB (ref 0.7–4.0)
Lymphocytes Relative: 4 % — ABNORMAL LOW (ref 12–46)
MCH: 29.6 pg (ref 26.0–34.0)
MCHC: 34.1 g/dL (ref 30.0–36.0)
MCV: 87 fL (ref 78.0–100.0)
MONO ABS: 0.3 10*3/uL (ref 0.1–1.0)
MONOS PCT: 2 % — AB (ref 3–12)
NEUTROS ABS: 14.6 10*3/uL — AB (ref 1.7–7.7)
Neutrophils Relative %: 94 % — ABNORMAL HIGH (ref 43–77)
Platelets: 281 10*3/uL (ref 150–400)
RBC: 4.69 MIL/uL (ref 4.22–5.81)
RDW: 12.6 % (ref 11.5–15.5)
WBC: 15.6 10*3/uL — ABNORMAL HIGH (ref 4.0–10.5)

## 2013-03-12 LAB — URINE MICROSCOPIC-ADD ON

## 2013-03-12 MED ORDER — SODIUM CHLORIDE 0.9 % IV BOLUS (SEPSIS)
1000.0000 mL | Freq: Once | INTRAVENOUS | Status: AC
  Administered 2013-03-12: 1000 mL via INTRAVENOUS

## 2013-03-12 NOTE — Discharge Instructions (Signed)
It is likely today they your pain was secondary to a kidney stone which she passed while in the emergency department. Given location of your pain and history of BPH, recommend followup with your urologist. Also followup with your primary care provider. Return to the emergency department if pain returns or if you experience persistent urinary or fecal incontinence, numbness of your genital region around your rectum, numbness/tingling in your legs, an inability to walk, or fever.  Back Pain, Adult Low back pain is very common. About 1 in 5 people have back pain.The cause of low back pain is rarely dangerous. The pain often gets better over time.About half of people with a sudden onset of back pain feel better in just 2 weeks. About 8 in 10 people feel better by 6 weeks.  CAUSES Some common causes of back pain include:  Strain of the muscles or ligaments supporting the spine.  Wear and tear (degeneration) of the spinal discs.  Arthritis.  Direct injury to the back. DIAGNOSIS Most of the time, the direct cause of low back pain is not known.However, back pain can be treated effectively even when the exact cause of the pain is unknown.Answering your caregiver's questions about your overall health and symptoms is one of the most accurate ways to make sure the cause of your pain is not dangerous. If your caregiver needs more information, he or she may order lab work or imaging tests (X-rays or MRIs).However, even if imaging tests show changes in your back, this usually does not require surgery. HOME CARE INSTRUCTIONS For many people, back pain returns.Since low back pain is rarely dangerous, it is often a condition that people can learn to [facility] their own.   Remain active. It is stressful on the back to sit or stand in one place. Do not sit, drive, or stand in one place for more than 30 minutes at a time. Take short walks on level surfaces as soon as pain allows.Try to increase the length of  time you walk each day.  Do not stay in bed.Resting more than 1 or 2 days can delay your recovery.  Do not avoid exercise or work.Your body is made to move.It is not dangerous to be active, even though your back may hurt.Your back will likely heal faster if you return to being active before your pain is gone.  Pay attention to your body when you bend and lift. Many people have less discomfortwhen lifting if they bend their knees, keep the load close to their bodies,and avoid twisting. Often, the most comfortable positions are those that put less stress on your recovering back.  Find a comfortable position to sleep. Use a firm mattress and lie on your side with your knees slightly bent. If you lie on your back, put a pillow under your knees.  Only take over-the-counter or prescription medicines as directed by your caregiver. Over-the-counter medicines to reduce pain and inflammation are often the most helpful.Your caregiver may prescribe muscle relaxant drugs.These medicines help dull your pain so you can more quickly return to your normal activities and healthy exercise.  Put ice on the injured area.  Put ice in a plastic bag.  Place a towel between your skin and the bag.  Leave the ice on for 15-20 minutes, 03-04 times a day for the first 2 to 3 days. After that, ice and heat may be alternated to reduce pain and spasms.  Ask your caregiver about trying back exercises and gentle massage. This may  be of some benefit.  Avoid feeling anxious or stressed.Stress increases muscle tension and can worsen back pain.It is important to recognize when you are anxious or stressed and learn ways to manage it.Exercise is a great option. SEEK MEDICAL CARE IF:  You have pain that is not relieved with rest or medicine.  You have pain that does not improve in 1 week.  You have new symptoms.  You are generally not feeling well. SEEK IMMEDIATE MEDICAL CARE IF:   You have pain that radiates  from your back into your legs.  You develop new bowel or bladder control problems.  You have unusual weakness or numbness in your arms or legs.  You develop nausea or vomiting.  You develop abdominal pain.  You feel faint. Document Released: 01/10/2005 Document Revised: 07/12/2011 Document Reviewed: 05/31/2010 [facility] Patient Information 2014 [place], [place]. Kidney Stones Kidney stones (urolithiasis) are solid masses that form inside your kidneys. The intense pain is caused by the stone moving through the kidney, ureter, bladder, and urethra (urinary tract). When the stone moves, the ureter starts to spasm around the stone. The stone is usually passed in your pee (urine).  HOME CARE  Drink enough fluids to keep your pee clear or pale yellow. This helps to get the stone out.  Strain all pee through the provided strainer. Do not pee without peeing through the strainer, not even once. If you pee the stone out, catch it in the strainer. The stone may be as small as a grain of salt. Take this to your doctor. This will help your doctor figure out what you can do to try to prevent more kidney stones.  Only take medicine as told by your doctor.  Follow up with your doctor as told.  Get follow-up X-rays as told by your doctor. GET HELP IF: You have pain that gets worse even if you have been taking pain medicine. GET HELP RIGHT AWAY IF:   Your pain does not get better with medicine.  You have a fever or shaking chills.  Your pain increases and gets worse over 18 hours.  You have new belly (abdominal) pain.  You feel faint or pass out.  You are unable to pee. MAKE SURE YOU:   Understand these instructions.  Will watch your condition.  Will get help right away if you are not doing well or get worse. Document Released: 06/29/2007 Document Revised: 09/12/2012 Document Reviewed: 06/13/2012 [facility] Patient Information 2014 [place], [place].

## 2013-03-12 NOTE — ED Provider Notes (Signed)
CSN: [id number]     Arrival date & time 03/12/13  1716 History   First MD Initiated Contact with Patient 03/12/13 1730     Chief Complaint  Patient presents with  . Flank Pain  . Back Pain    (Consider location/radiation/quality/duration/timing/severity/associated sxs/prior Treatment) HPI Comments: Patient is a 68 year old male with a history of diabetes mellitus, hyperlipidemia, and BPH presents to the emergency department for left low back pain. Patient states the pain began at approximately 10AM today. He states pain has been constantly worsening over the course of the afternoon; most worsening occurred at 1PM. Patient took 3 extra strength Tylenol with mild improvement. He states this made his pain "tolerable". Patient denies any radiation of his pain and describes it as a constant "bothersome" pain. He states that symptoms associated with the urge to urinate as well as to have a bowel movement. Patient states he tried to urinate at rest stop but only had "a dribble" come out. Patient began driving 10 minutes later and experienced an episode of urinary incontinence; he has not voided since this time but states his bladder feels full. He denies fecal incontinence, saddle anesthesia, perianal numbness, and a hx of LBP. He further denies fever, N/V, CP, SOB, abdominal pain, hematuria, dysuria, numbness/tingling and extremity weakness. He does endorse a hx of kidney stones. Urologist - Gaynelle Arabian.  The history is provided by the patient. No language interpreter was used.    Past Medical History  Diagnosis Date  . Diabetes mellitus   . Hyperlipidemia    History reviewed. No pertinent past surgical history. History reviewed. No pertinent family history. History  Substance Use Topics  . Smoking status: Never Smoker   . Smokeless tobacco: Not on file  . Alcohol Use: Not on file    Review of Systems  Constitutional: Negative for fever.  Respiratory: Negative for shortness of breath.    Cardiovascular: Negative for chest pain.  Gastrointestinal: Positive for nausea. Negative for vomiting, abdominal pain and diarrhea.  Genitourinary: Positive for urgency and decreased urine volume. Negative for dysuria, frequency and flank pain.       +incontinence of urine  Musculoskeletal: Positive for back pain. Negative for gait problem.  Neurological: Negative for weakness and numbness.  All other systems reviewed and are negative.      Allergies  Review of patient's allergies indicates no known allergies.  Home Medications   Current Outpatient Rx  Name  Route  Sig  Dispense  Refill  . aspirin 81 MG chewable tablet   Oral   Chew 81 mg by mouth daily.         . cholecalciferol (VITAMIN D) 1000 UNITS tablet   Oral   Take 1,000 Units by mouth daily.         . fish oil-omega-3 fatty acids 1000 MG capsule   Oral   Take 1 g by mouth See admin instructions. Takes daily Monday through Friday         . glucosamine-chondroitin 500-400 MG tablet   Oral   Take 1 tablet by mouth daily.         . metFORMIN (GLUCOPHAGE) 1000 MG tablet   Oral   Take 1,000 mg by mouth 2 (two) times daily with a meal.          . Multiple Vitamin (MULTIVITAMIN WITH MINERALS) TABS tablet   Oral   Take 1 tablet by mouth See admin instructions. Takes daily Monday through Friday         .  omeprazole (PRILOSEC) 20 MG capsule   Oral   Take 20 mg by mouth daily.         . pravastatin (PRAVACHOL) 40 MG tablet   Oral   Take 80 mg by mouth at bedtime.          BP 147/92  Pulse 105  Temp(Src) 98 F (36.7 C) (Oral)  Resp 18  SpO2 100%  Physical Exam  Nursing note and vitals reviewed. Constitutional: He is oriented to person, place, and time. He appears well-developed and well-nourished. No distress.  HENT:  Head: Normocephalic and atraumatic.  Mouth/Throat: Oropharynx is clear and moist. No oropharyngeal exudate.  Eyes: Conjunctivae and EOM are normal. No scleral icterus.   Neck: Normal range of motion.  Cardiovascular: Normal rate, regular rhythm and normal heart sounds.   Patient not tachycardic as noted in triage  Pulmonary/Chest: Effort normal and breath sounds normal. No respiratory distress. He has no wheezes. He has no rales.  Abdominal: Soft. Bowel sounds are normal. He exhibits no distension. There is no tenderness. There is no rebound and no guarding.  No peritoneal signs  Genitourinary:  Normal rectal tone on chaperoned rectal exam  Musculoskeletal: Normal range of motion.  No tenderness to palpation of the lumbosacral midline. No tenderness to palpation of the lumbosacral paraspinal muscles. Normal range of motion of back.  Neurological: He is alert and oriented to person, place, and time. He has normal reflexes. He displays normal reflexes. No sensory deficit. He exhibits normal muscle tone. GCS eye subscore is 4. GCS verbal subscore is 5. GCS motor subscore is 6.  Patient with normal sensation to light touch in bilateral lower extremities. Patellar and Achilles reflexes 2+ bilaterally. 5 out of 5 strength against resistance in bilateral lower extremities. Patient ambulatory with normal gait.  Skin: Skin is warm and dry. No rash noted. He is not diaphoretic. No erythema. No pallor.  Psychiatric: He has a normal mood and affect. His behavior is normal.    ED Course  Procedures (including critical care time) Labs Review Labs Reviewed  BASIC METABOLIC PANEL - Abnormal; Notable for the following:    Glucose, Bld 168 (*)    GFR calc non Af Amer 83 (*)    All other components within normal limits  CBC WITH DIFFERENTIAL - Abnormal; Notable for the following:    WBC 15.6 (*)    Neutrophils Relative % 94 (*)    Neutro Abs 14.6 (*)    Lymphocytes Relative 4 (*)    Lymphs Abs 0.6 (*)    Monocytes Relative 2 (*)    All other components within normal limits  URINALYSIS, ROUTINE W REFLEX MICROSCOPIC - Abnormal; Notable for the following:    Glucose, UA  100 (*)    Hgb urine dipstick LARGE (*)    Ketones, ur 40 (*)    All other components within normal limits  URINE MICROSCOPIC-ADD ON   Imaging Review No results found.  EKG Interpretation   None       MDM   Final diagnoses:  Back pain  Hematuria   68 year old male presents to the emergency department for back pain with onset 10 AM today. Patient also endorsing one episode of urinary incontinence. On arrival, he is well and nontoxic appearing, hemodynamically stable, and afebrile. Physical exam unremarkable. No neurologic findings today; patient has normal strength, sensation, and reflexes in b/l lower extremities. Negative straight leg raise and crossed straight leg raise. Normal strength against resistance in b/l lower  extremities in addition to normal rectal tone.   Patient experienced constant worsening pain throughout the day. During ED patient states pain completely resolved after void in ED. He has not experienced any further incontinence. Urinalysis today with 7-10 RBCs. Patient has a history of kidney stones and it is likely that his symptoms today are secondary to the same. Patient is noted to have no hydronephrosis on renal ultrasound. Ultrasound of the abdominal aorta also performed which shows a diameter of approximately 2 cm. Patient has a nonspecific mildly elevated leukocytosis today of 15.6. Labs otherwise unremarkable.  Given reassuring workup and resolution in patient's pain after voiding, do not believe further emergent workup or imaging is indicated at this time. Patient is stable for discharge with instruction to followup with his urologist, Dr. Gaynelle Arabian, as well as his primary care provider. Return precautions discussed and provided. Patient agreeable to plan with no unaddressed concerns. Patient seen also by Dr. Reather Converse who is in agreement with this workup, assessment, management plan, and patient's stability for discharge.   Filed Vitals:   03/12/13 1724  BP:  147/92  Pulse: 105  Temp: 98 F (36.7 C)  TempSrc: Oral  Resp: 18  SpO2: 100%     Antonietta Breach, PA-C 03/12/13 2221

## 2013-03-12 NOTE — ED Notes (Addendum)
Per pt sts that since this am he has had left flank/back pain. sts that he has had some urinary urgency and pressure. sts took some tylenol with minimal relief. sts some light headedness. sts he cannot get into a comfortable position. sts feeling of fullness in abdomen

## 2013-03-12 NOTE — ED Notes (Signed)
Performed cbg and result was 126  RN notified

## 2013-03-13 LAB — GLUCOSE, CAPILLARY: GLUCOSE-CAPILLARY: 126 mg/dL — AB (ref 70–99)

## 2013-03-14 NOTE — ED Provider Notes (Signed)
Medical screening examination/treatment/procedure(s) were conducted as a shared visit with non-physician practitioner(s) or resident and myself. I personally evaluated the patient during the encounter and agree with the findings and plan unless otherwise indicated.  I have personally reviewed any xrays and/ or EKG's with the provider and I agree with interpretation.  Left flank pain since earlier today, constant, severe, possibly similar to kidney stone hx years ago. No injury. Pain improved on arrival to ED and then resolved completely after voiding in ED. On exam, location of pain was very low flank/ upper buttocks on left, no midline tender, abdo soft/ NT, no pulsatile mass, well appearing, 5+ strength in F/E of LE at major joints, sensation intact, nl reflexes, rectal per PA. WBC elevated, no source, no sxs, possible stress reaction.  With age and flank pain, bedside US done early in course, no AAA, mild hydronephrosis left kidney.  Holding on CT scan at this time but low threshold for pt to return for any sxs.  Labs Reviewed  BASIC METABOLIC PANEL - Abnormal; Notable for the following:    Glucose, Bld 168 (*)    GFR calc non Af Amer 83 (*)    All other components within normal limits  CBC WITH DIFFERENTIAL - Abnormal; Notable for the following:    WBC 15.6 (*)    Neutrophils Relative % 94 (*)    Neutro Abs 14.6 (*)    Lymphocytes Relative 4 (*)    Lymphs Abs 0.6 (*)    Monocytes Relative 2 (*)    All other components within normal limits  URINALYSIS, ROUTINE W REFLEX MICROSCOPIC - Abnormal; Notable for the following:    Glucose, UA 100 (*)    Hgb urine dipstick LARGE (*)    Ketones, ur 40 (*)    All other components within normal limits  GLUCOSE, CAPILLARY - Abnormal; Notable for the following:    Glucose-Capillary 126 (*)    All other components within normal limits  URINE MICROSCOPIC-ADD ON    Emergency Focused Ultrasound Exam  Limited retroperitoneal ultrasound of kidneys   Performed and interpreted by Dr. Reather Converse  Indication: flank pain  Focused abdominal ultrasound with both kidneys imaged in transverse and longitudinal planes in real-time.  Interpretation: No hydronephrosis visualized. No stones or cysts visualized  Images archived electronically  EMERGENCY DEPARTMENT ULTRASOUND  Study: Limited Retroperitoneal Ultrasound of the Abdominal Aorta.  INDICATIONS:Back pain and Age>55  Multiple views of the abdominal aorta were obtained in real-time from the diaphragmatic hiatus to the aortic bifurcation in transverse planes with a multi-frequency probe.  PERFORMED BY: Myself  IMAGES ARCHIVED?: Yes  FINDINGS: Maximum aortic dimensions are 2.2 cm  LIMITATIONS: Bowel gas  INTERPRETATION: No abdominal aortic aneurysm    Left flank pain, hematuria   Mariea Clonts, MD 03/14/13 [phone]

## 2013-10-15 ENCOUNTER — Encounter: Payer: Self-pay | Admitting: Gastroenterology

## 2013-11-29 ENCOUNTER — Ambulatory Visit (AMBULATORY_SURGERY_CENTER): Payer: Self-pay

## 2013-11-29 VITALS — Ht 73.0 in | Wt 224.4 lb

## 2013-11-29 DIAGNOSIS — K6389 Other specified diseases of intestine: Secondary | ICD-10-CM

## 2013-11-29 DIAGNOSIS — Z86010 Personal history of colonic polyps: Secondary | ICD-10-CM

## 2013-11-29 MED ORDER — MOVIPREP 100 G PO SOLR
ORAL | Status: DC
Start: 2013-11-29 — End: 2014-01-07

## 2013-11-29 NOTE — Progress Notes (Signed)
Pt came into the office today for his pre-visit prior to his colonoscopy with Dr. Fuller Plan on 01/07/14.Pt states he had colon resection done in 2005 and a colonoscopy done in 2010 which his records were reviewed by Dr Fuller Plan in 2013 . Unable to find records or colon report.Pt signed a medical release form which will be given to Amanda-CMA.     Per pt, no allergies to soy or egg products.Pt not taking any weight loss meds or using  O2 at home.

## 2013-12-12 ENCOUNTER — Encounter: Payer: Self-pay | Admitting: Gastroenterology

## 2013-12-13 ENCOUNTER — Encounter: Payer: Medicare Other | Admitting: Gastroenterology

## 2014-01-07 ENCOUNTER — Encounter: Payer: Self-pay | Admitting: Gastroenterology

## 2014-01-07 ENCOUNTER — Ambulatory Visit (AMBULATORY_SURGERY_CENTER): Payer: Medicare Other | Admitting: Gastroenterology

## 2014-01-07 VITALS — BP 139/87 | HR 72 | Temp 97.6°F | Resp 23 | Ht 73.0 in | Wt 224.0 lb

## 2014-01-07 DIAGNOSIS — D125 Benign neoplasm of sigmoid colon: Secondary | ICD-10-CM

## 2014-01-07 DIAGNOSIS — D128 Benign neoplasm of rectum: Secondary | ICD-10-CM

## 2014-01-07 DIAGNOSIS — Z86010 Personal history of colonic polyps: Secondary | ICD-10-CM

## 2014-01-07 DIAGNOSIS — D123 Benign neoplasm of transverse colon: Secondary | ICD-10-CM

## 2014-01-07 LAB — GLUCOSE, CAPILLARY
GLUCOSE-CAPILLARY: 110 mg/dL — AB (ref 70–99)
GLUCOSE-CAPILLARY: 97 mg/dL (ref 70–99)

## 2014-01-07 MED ORDER — SODIUM CHLORIDE 0.9 % IV SOLN
500.0000 mL | INTRAVENOUS | Status: DC
Start: 2014-01-07 — End: 2014-01-07

## 2014-01-07 NOTE — Op Note (Signed)
[place]  [address] [place], [id number]   COLONOSCOPY PROCEDURE REPORT PATIENT: Roy [name], Roy [name]  MR#: [id number] BIRTHDATE: 1945-03-06 , 68  yrs. old GENDER: male ENDOSCOPIST: Ladene Artist, MD, [facility] REFERRED WH:QPRFF Kim, M.D. PROCEDURE DATE:  01/07/2014 PROCEDURE:   Colonoscopy with biopsy and Colonoscopy with snare polypectomy First Screening Colonoscopy - Avg.  risk and is 50 yrs.  old or older - No.  Prior Negative Screening - Now for repeat screening. N/A  History of Adenoma - Now for follow-up colonoscopy & has been > or = to 3 yrs.  Yes hx of adenoma.  Has been 3 or more years since last colonoscopy.  Polyps Removed Today? Yes. ASA CLASS:   Class II INDICATIONS:surveillance colonoscopy based on a history of adenomatous colonic polyp(s): TVA with surgical resection in 2005.  MEDICATIONS: Monitored anesthesia care and Propofol 200 mg IV DESCRIPTION OF PROCEDURE:   After the risks benefits and alternatives of the procedure were thoroughly explained, informed consent was obtained.  The digital rectal exam revealed no abnormalities of the rectum.   The LB [id number]  endoscope was introduced through the anus and advanced to the cecum, which was identified by both the appendix and ileocecal valve. No adverse events experienced.   The quality of the prep was good, using MoviPrep  The instrument was then slowly withdrawn as the colon was fully examined.  COLON FINDINGS: Two sessile polyps measuring 5-7 mm in size were found in the transverse colon.  Polypectomies were performed with a cold snare.  The resection was complete, the polyp tissue was completely retrieved and sent to histology.   A sessile polyp measuring 5 mm in size was found in the sigmoid colon.  A polypectomy was performed with a cold snare.  The resection was complete, the polyp tissue was completely retrieved and sent to histology.   A sessile polyp measuring 4 mm in size was  found in the rectum.  A polypectomy was performed with cold forceps.  The resection was complete, the polyp tissue was completely retrieved and sent to histology.   There was evidence of a prior end-to-end colorectal surgical anastomosis in the sigmoid colon.   The examination was otherwise normal.  Retroflexed views revealed internal Grade I hemorrhoids. The time to cecum=2 minutes 36 seconds.  Withdrawal time=11 minutes 05 seconds.  The scope was withdrawn and the procedure completed. COMPLICATIONS: There were no immediate complications. ENDOSCOPIC IMPRESSION: 1.   Two sessile polyps in the transverse colon; polypectomies performed with a cold snare 2.   Sessile polyp in the sigmoid colon; polypectomy performed with a cold snare 3.   Sessile polyp in the rectum; polypectomy performed with cold forceps 4.   Prior colorectal surgical anastomosis in the sigmoid colon 5.   Grade l internal hemorrhoids  RECOMMENDATIONS: 1.  Hold Aspirin and all other NSAIDS for 2 weeks. 2.  Await pathology results 3.  Repeat colonoscopy in 3 years if 3-4 polyps adenomatous; otherwise 5 years  eSigned:  Ladene Artist, MD, [facility] 01/07/2014 9:23 AM

## 2014-01-07 NOTE — Progress Notes (Signed)
  [facility] Post-op Note  Patient: Roy [name]  Procedure(s) Performed: colonoscopy  Patient Location: [facility] - Recovery Area  Anesthesia Type: Deep Sedation/Propofol  Level of Consciousness: awake, oriented and patient cooperative  Airway and Oxygen Therapy: Patient Spontanous Breathing  Post-op Pain: none  Post-op Assessment:  Post-op Vital signs reviewed, Patient's Cardiovascular Status Stable, Respiratory Function Stable, Patent Airway, No signs of Nausea or vomiting and Pain level controlled  Post-op Vital Signs: Reviewed and stable  Complications: No apparent anesthesia complications  Roy [name] E 9:24 AM

## 2014-01-07 NOTE — Patient Instructions (Signed)
YOU HAD AN ENDOSCOPIC PROCEDURE TODAY AT THE [facility]: Refer to the procedure report that was given to you for any specific questions about what was found during the examination.  If the procedure report does not answer your questions, please call your gastroenterologist to clarify.  If you requested that your care partner not be given the details of your procedure findings, then the procedure report has been included in a sealed envelope for you to review at your convenience later.  YOU SHOULD EXPECT: Some feelings of bloating in the abdomen. Passage of more gas than usual.  Walking can help get rid of the air that was put into your GI tract during the procedure and reduce the bloating. If you had a lower endoscopy (such as a colonoscopy or flexible sigmoidoscopy) you may notice spotting of blood in your stool or on the toilet paper. If you underwent a bowel prep for your procedure, then you may not have a normal bowel movement for a few days.  DIET: Your first meal following the procedure should be a light meal and then it is ok to progress to your normal diet.  A half-sandwich or bowl of soup is an example of a good first meal.  Heavy or fried foods are harder to digest and may make you feel nauseous or bloated.  Likewise meals heavy in dairy and vegetables can cause extra gas to form and this can also increase the bloating.  Drink plenty of fluids but you should avoid alcoholic beverages for 24 hours.  ACTIVITY: Your care partner should take you home directly after the procedure.  You should plan to take it easy, moving slowly for the rest of the day.  You can resume normal activity the day after the procedure however you should NOT DRIVE or use heavy machinery for 24 hours (because of the sedation medicines used during the test).    SYMPTOMS TO REPORT IMMEDIATELY: A gastroenterologist can be reached at any hour.  During normal business hours, 8:30 AM to 5:00 PM Monday through Friday,  call [phone].  After hours and on weekends, please call the GI answering service at [phone] who will take a message and have the physician on call contact you.   Following lower endoscopy (colonoscopy or flexible sigmoidoscopy):  Excessive amounts of blood in the stool  Significant tenderness or worsening of abdominal pains  Swelling of the abdomen that is new, acute  Fever of 100F or higher  FOLLOW UP: If any biopsies were taken you will be contacted by phone or by letter within the next 1-3 weeks.  Call your gastroenterologist if you have not heard about the biopsies in 3 weeks.  Our staff will call the home number listed on your records the next business day following your procedure to check on you and address any questions or concerns that you may have at that time regarding the information given to you following your procedure. This is a courtesy call and so if there is no answer at the home number and we have not heard from you through the emergency physician on call, we will assume that you have returned to your regular daily activities without incident.  SIGNATURES/CONFIDENTIALITY: You and/or your care partner have signed paperwork which will be entered into your electronic medical record.  These signatures attest to the fact that that the information above on your After Visit Summary has been reviewed and is understood.  Full responsibility of the confidentiality of this   discharge information lies with you and/or your care-partner.  Recommendations Hold aspirin, aspirin products, and all NSAIDS for 2 weeks. Next colonoscopy determined by pathology results, 3 years or 5 years. Polyp and hemorrhoid handouts provided to patient/care partner.

## 2014-01-07 NOTE — Progress Notes (Signed)
Called to room to assist during endoscopic procedure.  Patient ID and intended procedure confirmed with present staff. Received instructions for my participation in the procedure from the performing physician.  

## 2014-01-08 ENCOUNTER — Telehealth: Payer: Self-pay

## 2014-01-08 NOTE — Telephone Encounter (Signed)
I have left a message for the patient to call back to discuss signing the release

## 2014-01-08 NOTE — Telephone Encounter (Signed)
Left message on answering machine. 

## 2014-01-08 NOTE — Telephone Encounter (Signed)
-----   Message from Ladene Artist, MD sent at 01/07/2014  8:59 AM EST ----- We need records from prior colonoscopies by Dr. Lajoyce Corners done between 2005-2011

## 2014-01-10 NOTE — Telephone Encounter (Signed)
Patient came by and signed release.  I have sent it to Dr. Lajoyce Corners

## 2014-01-10 NOTE — Telephone Encounter (Signed)
Left message for patient to call back  

## 2014-01-17 ENCOUNTER — Encounter: Payer: Self-pay | Admitting: Gastroenterology

## 2014-09-19 ENCOUNTER — Other Ambulatory Visit (HOSPITAL_COMMUNITY): Payer: Self-pay | Admitting: Internal Medicine

## 2014-09-19 DIAGNOSIS — E7800 Pure hypercholesterolemia, unspecified: Secondary | ICD-10-CM

## 2014-09-19 DIAGNOSIS — E119 Type 2 diabetes mellitus without complications: Secondary | ICD-10-CM

## 2014-09-19 DIAGNOSIS — R55 Syncope and collapse: Secondary | ICD-10-CM

## 2014-09-19 DIAGNOSIS — I10 Essential (primary) hypertension: Secondary | ICD-10-CM

## 2014-09-25 ENCOUNTER — Ambulatory Visit (HOSPITAL_COMMUNITY): Payer: Medicare Other | Attending: Cardiology

## 2014-09-25 ENCOUNTER — Other Ambulatory Visit: Payer: Self-pay

## 2014-09-25 DIAGNOSIS — E119 Type 2 diabetes mellitus without complications: Secondary | ICD-10-CM

## 2014-09-25 DIAGNOSIS — E7800 Pure hypercholesterolemia, unspecified: Secondary | ICD-10-CM

## 2014-09-25 DIAGNOSIS — R55 Syncope and collapse: Secondary | ICD-10-CM | POA: Diagnosis present

## 2014-09-25 DIAGNOSIS — I358 Other nonrheumatic aortic valve disorders: Secondary | ICD-10-CM | POA: Diagnosis not present

## 2014-09-25 DIAGNOSIS — I371 Nonrheumatic pulmonary valve insufficiency: Secondary | ICD-10-CM | POA: Insufficient documentation

## 2014-09-25 DIAGNOSIS — I340 Nonrheumatic mitral (valve) insufficiency: Secondary | ICD-10-CM | POA: Diagnosis not present

## 2014-09-25 DIAGNOSIS — I10 Essential (primary) hypertension: Secondary | ICD-10-CM

## 2014-09-26 ENCOUNTER — Ambulatory Visit (HOSPITAL_COMMUNITY)
Admission: RE | Admit: 2014-09-26 | Discharge: 2014-09-26 | Disposition: A | Discharge status: Home / Self Care | Payer: Medicare Other | Source: Ambulatory Visit | Attending: Cardiovascular Disease | Admitting: Cardiovascular Disease

## 2014-09-26 DIAGNOSIS — E119 Type 2 diabetes mellitus without complications: Secondary | ICD-10-CM

## 2014-09-26 DIAGNOSIS — R55 Syncope and collapse: Secondary | ICD-10-CM | POA: Diagnosis not present

## 2014-09-26 DIAGNOSIS — K219 Gastro-esophageal reflux disease without esophagitis: Secondary | ICD-10-CM | POA: Insufficient documentation

## 2014-09-26 DIAGNOSIS — I10 Essential (primary) hypertension: Secondary | ICD-10-CM | POA: Diagnosis not present

## 2014-09-26 DIAGNOSIS — E7800 Pure hypercholesterolemia, unspecified: Secondary | ICD-10-CM

## 2014-09-26 DIAGNOSIS — I6521 Occlusion and stenosis of right carotid artery: Secondary | ICD-10-CM | POA: Insufficient documentation

## 2014-10-21 DIAGNOSIS — M722 Plantar fascial fibromatosis: Secondary | ICD-10-CM | POA: Insufficient documentation

## 2014-10-21 DIAGNOSIS — R55 Syncope and collapse: Secondary | ICD-10-CM | POA: Insufficient documentation

## 2014-10-21 DIAGNOSIS — E559 Vitamin D deficiency, unspecified: Secondary | ICD-10-CM | POA: Insufficient documentation

## 2014-10-21 DIAGNOSIS — N200 Calculus of kidney: Secondary | ICD-10-CM

## 2014-10-21 DIAGNOSIS — J309 Allergic rhinitis, unspecified: Secondary | ICD-10-CM

## 2014-10-24 ENCOUNTER — Ambulatory Visit (INDEPENDENT_AMBULATORY_CARE_PROVIDER_SITE_OTHER): Payer: Medicare Other | Admitting: Cardiology

## 2014-10-24 ENCOUNTER — Encounter: Payer: Self-pay | Admitting: Cardiology

## 2014-10-24 VITALS — BP 145/92 | HR 107 | Ht 73.0 in | Wt 222.0 lb

## 2014-10-24 DIAGNOSIS — E785 Hyperlipidemia, unspecified: Secondary | ICD-10-CM

## 2014-10-24 DIAGNOSIS — I10 Essential (primary) hypertension: Secondary | ICD-10-CM

## 2014-10-24 DIAGNOSIS — R072 Precordial pain: Secondary | ICD-10-CM | POA: Diagnosis not present

## 2014-10-24 DIAGNOSIS — I429 Cardiomyopathy, unspecified: Secondary | ICD-10-CM

## 2014-10-24 DIAGNOSIS — R079 Chest pain, unspecified: Secondary | ICD-10-CM | POA: Insufficient documentation

## 2014-10-24 NOTE — Progress Notes (Signed)
Patient ID: Roy [name], male   DOB: 1945-12-25, 69 y.o.   MRN: [id number]      Cardiology Office Note  Date:  10/24/2014   ID:  Allante, Roy [name] 1945/11/07, MRN [id number]  PCP:  Jani Gravel, MD  Cardiologist:  Dorothy Spark, MD   Chief complain: Recurrent syncope, abnormal echocardiogram   History of Present Illness: Roy [name] is a 69 y.o. male with h/o DM2, HTN, HLP, obesity who presents for evaluation of recurrent syncope. He past out 4 x in the last 20 years, it was always on a hot day outside, while being active. No chest pain or palpitations prior to these episodes. He went to the ER on two occasions and work up was negative. He had an echocardiogram done that shows mildly impaired LVEF 45-50% with regional wall motion abnormalities in the mid LAD distribution (I personally reviewed the images).  He denies chest pain and is fairly active, denies SOB, might have some fatigue. No palpitations, no LE edema, orthopnea, PND. No significant FH of premature CAD or SCD.  Past Medical History  Diagnosis Date  . Diabetes mellitus   . Hyperlipidemia   . GERD (gastroesophageal reflux disease)   . Hypertension   . Allergic rhinitis   . Syncope   . Nephrolithiasis   . Plantar fasciitis   . Hypovitaminosis D     Past Surgical History  Procedure Laterality Date  . Shoulder surgery      Bil/scar tissue in joints  . Tonsillectomy and adenoidectomy    . 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 Colonoscopy  07/20/2007     Current Outpatient Prescriptions  Medication Sig Dispense Refill  . amLODipine (NORVASC) 10 MG tablet Take 10 mg by mouth daily.    [name] aspirin 81 MG chewable tablet Chew 81 mg by mouth. 5 times weekly    . benazepril (LOTENSIN) 20 MG tablet Take 20 mg by mouth daily.    . cholecalciferol (VITAMIN D) 1000 UNITS tablet Take 1,000 Units by mouth every evening. Vit D3  2000 IU-Take a pill daily    . CINNAMON PO Take 1,000 mg by mouth  daily.    [name] glucosamine-chondroitin 500-400 MG tablet Take 1 tablet by mouth. 5 times weekly    . metFORMIN (GLUCOPHAGE) 1000 MG tablet Take 1,000 mg by mouth 2 (two) times daily with a meal.     . Multiple Vitamin (MULTIVITAMIN WITH MINERALS) TABS tablet Take 1 tablet by mouth. 5 times weekly    . omeprazole (PRILOSEC) 20 MG capsule Take 20 mg by mouth daily.    . pioglitazone (ACTOS) 30 MG tablet Take 30 mg by mouth.     . pravastatin (PRAVACHOL) 40 MG tablet Take 80 mg by mouth at bedtime.     No current facility-administered medications for this visit.   Allergies:   Review of patient's allergies indicates no known allergies.   Social History:  The patient  reports that he quit smoking about 37 years ago. His smoking use included Pipe. He has never used smokeless tobacco. He reports that he does not drink alcohol or use illicit drugs.   Family History:  The patient's family history includes Heart attack in his mother; Stroke in his father.   ROS:  Please see the history of present illness.   Otherwise, review of systems are positive for none.   All other systems are reviewed and negative.   PHYSICAL EXAM: VS:  BP 145/92 mmHg  Pulse 107  Ht 6\' 1"  (1.854 m)  Wt 222 lb (100.699 kg)  BMI 29.30 kg/m2 , BMI Body mass index is 29.3 kg/(m^2). GEN: Well nourished, well developed, in no acute distress HEENT: normal Neck: no JVD, carotid bruits, or masses Cardiac: RRR; no murmurs, rubs, or gallops,no edema  Respiratory:  clear to auscultation bilaterally, normal work of breathing GI: soft, nontender, nondistended, + BS MS: no deformity or atrophy Skin: warm and dry, no rash Neuro:  Strength and sensation are intact Psych: euthymic mood, full affect  EKG:  EKG is ordered today. The ekg ordered today demonstrates Sinus tachycardia, LAD, LVH, non-specific ST-T wave abnormalities.   Recent Labs: No results found for requested labs within last 365 days.   Lipid Panel No results found  for: CHOL, TRIG, HDL, CHOLHDL, VLDL, LDLCALC, LDLDIRECT   Wt Readings from Last 3 Encounters:  10/24/14 222 lb (100.699 kg)  01/07/14 224 lb (101.606 kg)  11/29/13 224 lb 6.4 oz (101.787 kg)    TTE:  - Left ventricle: The cavity size was normal. Systolic function was normal. The estimated ejection fraction was in the range of 50% to 55%. Poor acoustic windows prevents adequate assessment of wall motion. There appears to be akinesis of the apical septum and apex but other walls not well visualized. Recommend limited study with definity contrast agent. There was an increased relative contribution of atrial contraction to ventricular filling. Doppler parameters are consistent with abnormal left ventricular relaxation (grade 1 diastolic dysfunction). - Aortic valve: Trileaflet; normal thickness, mildly calcified leaflets. - Mitral valve: There was trivial regurgitation. - Pulmonic valve: There was trivial regurgitation.  Labs: HDL 42, LDL 81, TG 247    ASSESSMENT AND PLAN:  1.  Abnormal ECG and echocardiogram - wall motion abnormalities suggestive of mid LAD, we will schedule an exercise nuclear stress test.  2. 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  3. Hypertension - uncontrolled, I will uptitrate meds based on stress test results, we will add carvedilol 6.26 mg po BID.  4. HLP - TG high, we will discuss switching to lipitor or crestor at the next visit.  Follow up in 2 months.   Signed, Dorothy Spark, MD  10/24/2014 2:33 PM    [place], [place], [place]  [address] Phone: [phone]; Fax: [phone]

## 2014-10-24 NOTE — Patient Instructions (Signed)
Medication Instructions:   Your physician recommends that you continue on your current medications as directed. Please refer to the Current Medication list given to you today.     Testing/Procedures:  Your physician has requested that you have en exercise stress myoview. For further information please visit [email or web address]. Please follow instruction sheet, as given.     Follow-Up:  3 MONTHS WITH DR [name]

## 2014-10-29 ENCOUNTER — Telehealth (HOSPITAL_COMMUNITY): Payer: Self-pay | Admitting: *Deleted

## 2014-10-29 NOTE — Telephone Encounter (Signed)
Left message on voicemail in reference to upcoming appointment scheduled for 11/03/14. Phone number given for a call back so details instructions can be given. Roy [name]

## 2014-10-30 ENCOUNTER — Telehealth (HOSPITAL_COMMUNITY): Payer: Self-pay | Admitting: *Deleted

## 2014-10-30 NOTE — Telephone Encounter (Signed)
Patient given detailed instructions per Myocardial Perfusion Study Information Sheet for test on 11/03/14 at 745 by Loma Newton. Patient notified to arrive 15 minutes early and that it is imperative to arrive on time for appointment to keep from having the test rescheduled.  If you need to cancel or reschedule your appointment, please call the office within 24 hours of your appointment. Failure to do so may result in a cancellation of your appointment, and a $50 no show fee. Patient verbalized understanding.Crissie Figures, RN

## 2014-11-03 ENCOUNTER — Ambulatory Visit (HOSPITAL_COMMUNITY): Payer: Medicare Other | Attending: Internal Medicine

## 2014-11-03 DIAGNOSIS — I10 Essential (primary) hypertension: Secondary | ICD-10-CM | POA: Diagnosis not present

## 2014-11-03 DIAGNOSIS — E119 Type 2 diabetes mellitus without complications: Secondary | ICD-10-CM | POA: Diagnosis not present

## 2014-11-03 DIAGNOSIS — R072 Precordial pain: Secondary | ICD-10-CM | POA: Diagnosis not present

## 2014-11-03 DIAGNOSIS — I429 Cardiomyopathy, unspecified: Secondary | ICD-10-CM | POA: Diagnosis not present

## 2014-11-03 DIAGNOSIS — R55 Syncope and collapse: Secondary | ICD-10-CM | POA: Insufficient documentation

## 2014-11-03 LAB — MYOCARDIAL PERFUSION IMAGING
Estimated workload: 7 METS
Exercise duration (min): 5 min
Exercise duration (sec): 15 s
LV dias vol: 141 mL
LV sys vol: 89 mL
MPHR: 149 {beats}/min
Peak HR: 153 {beats}/min
Percent HR: 102 %
RATE: 0.34
Rest HR: 71 {beats}/min
SDS: 5
SRS: 5
SSS: 10
TID: 0.99

## 2014-11-03 IMAGING — NM NM MISC PROCEDURE
9 series · 54 of 54 positions shown · non-contrast
Comparison: none

[Series 1: stress-gsp_(id)_sa · 6.4mm · 6.40mm/px · 6 of 512 frames shown]
[frame 43/512]
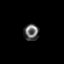
[frame 128/512]
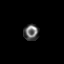
[frame 214/512]
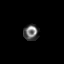
[frame 299/512]
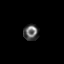
[frame 384/512]
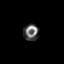
[frame 470/512]
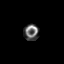

[Series 1: rest · 6.40mm/px · 6 of 64 frames shown]
[frame 6/64]
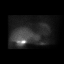
[frame 16/64]
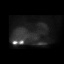
[frame 27/64]
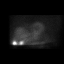
[frame 38/64]
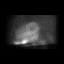
[frame 48/64]
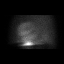
[frame 59/64]
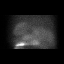

[Series 1: stress-gsp · 6.40mm/px · 6 of 510 frames shown]
[frame 43/510  full-range]
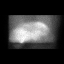
[frame 128/510  full-range]
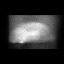
[frame 213/510  full-range]
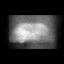
[frame 298/510  full-range]
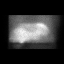
[frame 383/510  full-range]
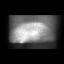
[frame 468/510  full-range]
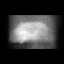

[Series 1: wbr_s-card_st stress-sum-em · 6.4mm · 6.40mm/px · 6 of 25 frames shown]
[frame 3/25]
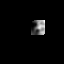
[frame 7/25]
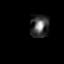
[frame 11/25]
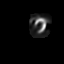
[frame 15/25]
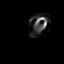
[frame 19/25]
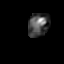
[frame 23/25]
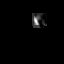

[Series 1: wbr_r-card_st rest · 6.4mm · 6.40mm/px · 6 of 23 frames shown]
[frame 2/23]
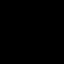
[frame 6/23]
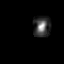
[frame 10/23]
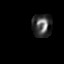
[frame 14/23]
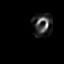
[frame 18/23]
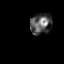
[frame 22/23]
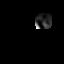

[Series 1: stress-sum-em_(id)_sa · 6.4mm · 6.40mm/px · 6 of 64 frames shown]
[frame 6/64]
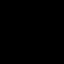
[frame 16/64]
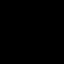
[frame 27/64]
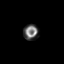
[frame 38/64]
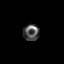
[frame 48/64]
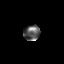
[frame 59/64]
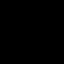

[Series 1: stress-sum-em · 6.40mm/px · 6 of 64 frames shown]
[frame 6/64]
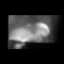
[frame 16/64]
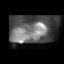
[frame 27/64]
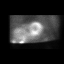
[frame 38/64]
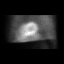
[frame 48/64]
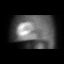
[frame 59/64]
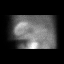

[Series 1: wbr_s-card_st stress-gsp · 6.4mm · 6.40mm/px · 6 of 194 frames shown]
[frame 17/194]
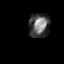
[frame 49/194]
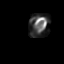
[frame 81/194]
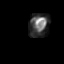
[frame 114/194]
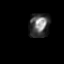
[frame 146/194]
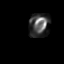
[frame 178/194]
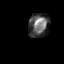

[Series 1: rest_(id)_sa · 6.4mm · 6.40mm/px · 6 of 64 frames shown]
[frame 6/64]
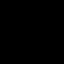
[frame 16/64]
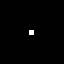
[frame 27/64]
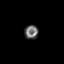
[frame 38/64]
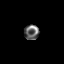
[frame 48/64]
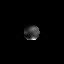
[frame 59/64]
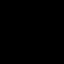

[54 of 54 positions shown; findings below may reference images not displayed]

Canned report from images found in remote index.

Refer to host system for actual result text.

## 2014-11-03 MED ORDER — TECHNETIUM TC 99M SESTAMIBI GENERIC - CARDIOLITE
10.2000 | Freq: Once | INTRAVENOUS | Status: AC | PRN
  Administered 2014-11-03: 10 via INTRAVENOUS

## 2014-11-03 MED ORDER — TECHNETIUM TC 99M SESTAMIBI GENERIC - CARDIOLITE
32.4000 | Freq: Once | INTRAVENOUS | Status: AC | PRN
  Administered 2014-11-03: 32 via INTRAVENOUS

## 2014-11-04 ENCOUNTER — Telehealth: Payer: Self-pay | Admitting: *Deleted

## 2014-11-04 MED ORDER — LISINOPRIL 40 MG PO TABS: 40.0000 mg | ORAL_TABLET | Freq: Every day | ORAL | Status: DC

## 2014-11-04 MED ORDER — CARVEDILOL 3.125 MG PO TABS: 3.1250 mg | ORAL_TABLET | Freq: Two times a day (BID) | ORAL | Status: DC

## 2014-11-04 NOTE — Telephone Encounter (Signed)
-----   Message from Dorothy Spark, MD sent at 11/04/2014  6:07 PM EDT ----- He has low LV function but no ischemia, I would switch his benazepril to lisinopril 40 mg po daily and add carvedilol 3.125 mg po BID.  Follow up in 3 months.

## 2014-11-04 NOTE — Telephone Encounter (Signed)
Notified the pt and spouse that per Dr [name] his stress test showed that he has low LV function but no ischemia, and she recommends that we switch his benazepril to lisinopril 40 mg po daily and add carvedilol 3.125 mg po bid and follow-up with the pt as planned with Dr [name] on 01/30/15 at 0800.  Confirmed the pharmacy of choice with the pt.  Pt and spouse verbalized understanding and agrees with this plan.

## 2014-11-26 ENCOUNTER — Encounter: Payer: Self-pay | Admitting: Cardiology

## 2015-01-30 ENCOUNTER — Ambulatory Visit (INDEPENDENT_AMBULATORY_CARE_PROVIDER_SITE_OTHER): Payer: Medicare Other | Admitting: Cardiology

## 2015-01-30 ENCOUNTER — Encounter: Payer: Self-pay | Admitting: Cardiology

## 2015-01-30 VITALS — BP 124/62 | HR 85 | Ht 73.0 in | Wt 228.0 lb

## 2015-01-30 DIAGNOSIS — I428 Other cardiomyopathies: Secondary | ICD-10-CM

## 2015-01-30 DIAGNOSIS — I10 Essential (primary) hypertension: Secondary | ICD-10-CM | POA: Diagnosis not present

## 2015-01-30 DIAGNOSIS — E785 Hyperlipidemia, unspecified: Secondary | ICD-10-CM | POA: Diagnosis not present

## 2015-01-30 DIAGNOSIS — I429 Cardiomyopathy, unspecified: Secondary | ICD-10-CM | POA: Diagnosis not present

## 2015-01-30 DIAGNOSIS — R55 Syncope and collapse: Secondary | ICD-10-CM | POA: Insufficient documentation

## 2015-01-30 NOTE — Patient Instructions (Signed)
Medication Instructions:   Your physician recommends that you continue on your current medications as directed. Please refer to the Current Medication list given to you today.    Testing/Procedures:  Your physician has requested that you have an echocardiogram. Echocardiography is a painless test that uses sound waves to create images of your heart. It provides your doctor with information about the size and shape of your heart and how well your heart's chambers and valves are working. This procedure takes approximately one hour. There are no restrictions for this procedure.  PLEASE HAVE THIS SCHEDULED PRIOR TO YOUR 6 MONTH FOLLOW-UP WITH DR [name]    Follow-Up:  Your physician wants you to follow-up in: [place] will receive a reminder letter in the mail two months in advance. If you don't receive a letter, please call our office to schedule the follow-up appointment.  PLEASE HAVE YOUR ECHO SCHEDULED PRIOR TO THIS OFFICE VISIT     If you need a refill on your cardiac medications before your next appointment, please call your pharmacy.

## 2015-01-30 NOTE — Progress Notes (Signed)
Patient ID: Roy [name], male   DOB: [birth year], 70 y.o.   MRN: [id number]      Cardiology Office Note  Date:  01/30/2015   ID:  Roy, [name] [birth year], MRN [id number]  PCP:  Jani Gravel, MD  Cardiologist:  Dorothy Spark, MD   Chief complain: Recurrent syncope, abnormal echocardiogram   History of Present Illness: Roy [name] is a 70 y.o. male with h/o DM2, HTN, HLP, obesity who presents for evaluation of recurrent syncope. He past out 4 x in the last 20 years, it was always on a hot day outside, while being active. No chest pain or palpitations prior to these episodes. He went to the ER on two occasions and work up was negative. He had an echocardiogram done that shows mildly impaired LVEF 45-50% with regional wall motion abnormalities in the mid LAD distribution (I personally reviewed the images).  He denies chest pain and is fairly active, denies SOB, might have some fatigue. No palpitations, no LE edema, orthopnea, PND. No significant FH of premature CAD or SCD.  01/30/2015 - 2 months follow up, negative stress test, abnormal LVEF, started on ACEI and BB, no CP, DOE, stays active, no palpitations, syncope, no orthopnea, PND.   Past Medical History  Diagnosis Date  . Diabetes mellitus   . Hyperlipidemia   . GERD (gastroesophageal reflux disease)   . Hypertension   . Allergic rhinitis   . Syncope   . Nephrolithiasis   . Plantar fasciitis   . Hypovitaminosis D    Past Surgical History  Procedure Laterality Date  . Shoulder surgery      Bil/scar tissue in joints  . Tonsillectomy and adenoidectomy    . 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 Colonoscopy  07/20/2007   Current Outpatient Prescriptions  Medication Sig Dispense Refill  . amLODipine (NORVASC) 10 MG tablet Take 10 mg by mouth daily.    [name] aspirin 81 MG tablet as directed. Take 1 tablet by mouth 5 times weekly.    . carvedilol (COREG) 3.125 MG tablet Take 1 tablet (3.125 mg  total) by mouth 2 (two) times daily. 180 tablet 3  . cholecalciferol (VITAMIN D) 1000 UNITS tablet Take 1,000 Units by mouth every evening. Vit D3  2000 IU-Take a pill daily    . CINNAMON PO Take 1,000 mg by mouth daily.    [name] glucosamine-chondroitin 500-400 MG tablet Take 1 tablet by mouth. 5 times weekly    . lisinopril (PRINIVIL,ZESTRIL) 40 MG tablet Take 1 tablet (40 mg total) by mouth daily. 90 tablet 3  . metFORMIN (GLUCOPHAGE) 1000 MG tablet Take 1,000 mg by mouth 2 (two) times daily with a meal.     . Multiple Vitamin (MULTIVITAMIN WITH MINERALS) TABS tablet Take 1 tablet by mouth. 5 times weekly    . omeprazole (PRILOSEC) 20 MG capsule Take 20 mg by mouth daily.    . pioglitazone (ACTOS) 30 MG tablet Take 30 mg by mouth.     . pravastatin (PRAVACHOL) 40 MG tablet Take 80 mg by mouth at bedtime.     No current facility-administered medications for this visit.   Allergies:   Review of patient's allergies indicates no known allergies.   Social History:  The patient  reports that he quit smoking about 38 years ago. His smoking use included Pipe. He has never used smokeless tobacco. He reports that he does not drink alcohol or use illicit drugs.  Family History:  The patient's family history includes Heart attack in his mother; Stroke in his father.   ROS:  Please see the history of present illness.   Otherwise, review of systems are positive for none.   All other systems are reviewed and negative.   PHYSICAL EXAM: VS:  BP 124/62 mmHg  Pulse 85  Ht 6\' 1"  (1.854 m)  Wt 228 lb (103.42 kg)  BMI 30.09 kg/m2 , BMI Body mass index is 30.09 kg/(m^2). GEN: Well nourished, well developed, in no acute distress HEENT: normal Neck: no JVD, carotid bruits, or masses Cardiac: RRR; no murmurs, rubs, or gallops,no edema  Respiratory:  clear to auscultation bilaterally, normal work of breathing GI: soft, nontender, nondistended, + BS MS: no deformity or atrophy Skin: warm and dry, no  rash Neuro:  Strength and sensation are intact Psych: euthymic mood, full affect  EKG:  EKG is ordered today. The ekg ordered today demonstrates Sinus tachycardia, LAD, LVH, non-specific ST-T wave abnormalities.   Recent Labs: No results found for requested labs within last 365 days.   Lipid Panel No results found for: CHOL, TRIG, HDL, CHOLHDL, VLDL, LDLCALC, LDLDIRECT   Wt Readings from Last 3 Encounters:  01/30/15 228 lb (103.42 kg)  11/03/14 222 lb (100.699 kg)  10/24/14 222 lb (100.699 kg)    TTE:  - Left ventricle: The cavity size was normal. Systolic function was normal. The estimated ejection fraction was in the range of 50% to 55%. Poor acoustic windows prevents adequate assessment of wall motion. There appears to be akinesis of the apical septum and apex but other walls not well visualized. Recommend limited study with definity contrast agent. There was an increased relative contribution of atrial contraction to ventricular filling. Doppler parameters are consistent with abnormal left ventricular relaxation (grade 1 diastolic dysfunction). - Aortic valve: Trileaflet; normal thickness, mildly calcified leaflets. - Mitral valve: There was trivial regurgitation. - Pulmonic valve: There was trivial regurgitation.  Labs: HDL 42, LDL 81, TG 247  TTE: 09/2014 Left ventricle: The cavity size was normal. Systolic function was normal. The estimated ejection fraction was in the range of 50% to 55%. Poor acoustic windows prevents adequate assessment of wall motion. There appears to be akinesis of the apical septum and apex but other walls not well visualized. Recommend limited study with definity contrast agent. There was an increased relative contribution of atrial contraction to ventricular filling. Doppler parameters are consistent with abnormal left ventricular relaxation (grade 1 diastolic dysfunction). - Aortic valve: Trileaflet; normal  thickness, mildly calcified leaflets. - Mitral valve: There was trivial regurgitation. - Pulmonic valve: There was trivial regurgitation.  Lexiscan nuclear stress test: 11/03/2014  The left ventricular ejection fraction is moderately decreased (30-44%).  Nuclear stress EF: 37%.  There was no ST segment deviation noted during stress.  This is an intermediate risk study.  No ischemia.  There is LV systolic dysfunction with apical hypokinesis.    ASSESSMENT AND PLAN:  1.  Abnormal ECG and echocardiogram - wall motion abnormalities suggestive of mid LAD, however seems like nonischemic cardiomyopathy (read as no ischemia, potentially mild ischemia in the distal apical and in the true apex)< the patient is asymptomatic, no CP and no DOE.   2. Cardiomyopathy - LVEF 45-50% -started on lisinopril and carvedilol, he is asymptomatic and euvolemic, we will repeat echocardiogram prior to the next visit.   3. Hypertension - controlled after adding carvedilol 6.26 mg po BID.  4. HLP - TG high, on pravastatin, consider  adding fenofibrates  Follow up in 6 months, echo prior to the next visit.  Signed, Dorothy Spark, MD  01/30/2015 8:21 AM    [address], [place], [place]  [address] Phone: [phone]; Fax: [phone]

## 2015-08-03 ENCOUNTER — Other Ambulatory Visit: Payer: Self-pay

## 2015-08-03 ENCOUNTER — Ambulatory Visit (HOSPITAL_COMMUNITY): Payer: Medicare Other | Attending: Cardiology

## 2015-08-03 DIAGNOSIS — I371 Nonrheumatic pulmonary valve insufficiency: Secondary | ICD-10-CM | POA: Insufficient documentation

## 2015-08-03 DIAGNOSIS — R29898 Other symptoms and signs involving the musculoskeletal system: Secondary | ICD-10-CM | POA: Insufficient documentation

## 2015-08-03 DIAGNOSIS — I429 Cardiomyopathy, unspecified: Secondary | ICD-10-CM | POA: Insufficient documentation

## 2015-08-03 DIAGNOSIS — I340 Nonrheumatic mitral (valve) insufficiency: Secondary | ICD-10-CM | POA: Diagnosis not present

## 2015-08-03 DIAGNOSIS — E785 Hyperlipidemia, unspecified: Secondary | ICD-10-CM | POA: Diagnosis not present

## 2015-08-03 DIAGNOSIS — E119 Type 2 diabetes mellitus without complications: Secondary | ICD-10-CM | POA: Insufficient documentation

## 2015-08-03 DIAGNOSIS — I358 Other nonrheumatic aortic valve disorders: Secondary | ICD-10-CM | POA: Diagnosis not present

## 2015-08-03 DIAGNOSIS — I10 Essential (primary) hypertension: Secondary | ICD-10-CM | POA: Insufficient documentation

## 2015-08-03 DIAGNOSIS — K219 Gastro-esophageal reflux disease without esophagitis: Secondary | ICD-10-CM | POA: Insufficient documentation

## 2015-09-27 ENCOUNTER — Emergency Department (HOSPITAL_COMMUNITY): Payer: Medicare Other

## 2015-09-27 ENCOUNTER — Emergency Department (HOSPITAL_COMMUNITY)
Admission: EM | Admit: 2015-09-27 | Discharge: 2015-09-27 | Disposition: A | Discharge status: Home / Self Care | Payer: Medicare Other | Attending: Emergency Medicine | Admitting: Emergency Medicine

## 2015-09-27 ENCOUNTER — Encounter (HOSPITAL_COMMUNITY): Payer: Self-pay | Admitting: *Deleted

## 2015-09-27 DIAGNOSIS — R55 Syncope and collapse: Secondary | ICD-10-CM | POA: Diagnosis present

## 2015-09-27 DIAGNOSIS — I10 Essential (primary) hypertension: Secondary | ICD-10-CM | POA: Diagnosis not present

## 2015-09-27 DIAGNOSIS — Z79899 Other long term (current) drug therapy: Secondary | ICD-10-CM | POA: Insufficient documentation

## 2015-09-27 DIAGNOSIS — I951 Orthostatic hypotension: Secondary | ICD-10-CM | POA: Insufficient documentation

## 2015-09-27 DIAGNOSIS — Z7984 Long term (current) use of oral hypoglycemic drugs: Secondary | ICD-10-CM | POA: Diagnosis not present

## 2015-09-27 DIAGNOSIS — Z7982 Long term (current) use of aspirin: Secondary | ICD-10-CM | POA: Insufficient documentation

## 2015-09-27 DIAGNOSIS — Z87891 Personal history of nicotine dependence: Secondary | ICD-10-CM | POA: Diagnosis not present

## 2015-09-27 DIAGNOSIS — E119 Type 2 diabetes mellitus without complications: Secondary | ICD-10-CM | POA: Diagnosis not present

## 2015-09-27 LAB — CBC WITH DIFFERENTIAL/PLATELET
Basophils Absolute: 0 10*3/uL (ref 0.0–0.1)
Basophils Relative: 0 %
EOS ABS: 0.1 10*3/uL (ref 0.0–0.7)
EOS PCT: 1 %
HCT: 39.1 % (ref 39.0–52.0)
HEMOGLOBIN: 12.5 g/dL — AB (ref 13.0–17.0)
LYMPHS ABS: 1.4 10*3/uL (ref 0.7–4.0)
LYMPHS PCT: 11 %
MCH: 28.7 pg (ref 26.0–34.0)
MCHC: 32 g/dL (ref 30.0–36.0)
MCV: 89.7 fL (ref 78.0–100.0)
MONOS PCT: 6 %
Monocytes Absolute: 0.8 10*3/uL (ref 0.1–1.0)
Neutro Abs: 10.7 10*3/uL — ABNORMAL HIGH (ref 1.7–7.7)
Neutrophils Relative %: 82 %
PLATELETS: 243 10*3/uL (ref 150–400)
RBC: 4.36 MIL/uL (ref 4.22–5.81)
RDW: 12.4 % (ref 11.5–15.5)
WBC: 12.9 10*3/uL — ABNORMAL HIGH (ref 4.0–10.5)

## 2015-09-27 LAB — URINALYSIS, ROUTINE W REFLEX MICROSCOPIC
BILIRUBIN URINE: NEGATIVE
GLUCOSE, UA: NEGATIVE mg/dL
HGB URINE DIPSTICK: NEGATIVE
KETONES UR: NEGATIVE mg/dL
LEUKOCYTES UA: NEGATIVE
Nitrite: NEGATIVE
PH: 6 (ref 5.0–8.0)
PROTEIN: NEGATIVE mg/dL
Specific Gravity, Urine: 1.014 (ref 1.005–1.030)

## 2015-09-27 LAB — COMPREHENSIVE METABOLIC PANEL
ALK PHOS: 50 U/L (ref 38–126)
ALT: 11 U/L — ABNORMAL LOW (ref 17–63)
ANION GAP: 6 (ref 5–15)
AST: 19 U/L (ref 15–41)
Albumin: 3.8 g/dL (ref 3.5–5.0)
BUN: 13 mg/dL (ref 6–20)
CALCIUM: 8.8 mg/dL — AB (ref 8.9–10.3)
CO2: 22 mmol/L (ref 22–32)
CREATININE: 1.05 mg/dL (ref 0.61–1.24)
Chloride: 109 mmol/L (ref 101–111)
Glucose, Bld: 123 mg/dL — ABNORMAL HIGH (ref 65–99)
Potassium: 3.9 mmol/L (ref 3.5–5.1)
SODIUM: 137 mmol/L (ref 135–145)
TOTAL PROTEIN: 6.6 g/dL (ref 6.5–8.1)
Total Bilirubin: 0.7 mg/dL (ref 0.3–1.2)

## 2015-09-27 LAB — TROPONIN I

## 2015-09-27 LAB — PROTIME-INR
INR: 1.07
PROTHROMBIN TIME: 13.9 s (ref 11.4–15.2)

## 2015-09-27 IMAGING — DX DG CHEST 2V
2 series · 2 of 2 positions shown · non-contrast
Comparison: PA and lateral chest [DATE].

CLINICAL DATA: Syncope today.  Hypotension.

EXAM:
CHEST  2 VIEW

[chest pa]
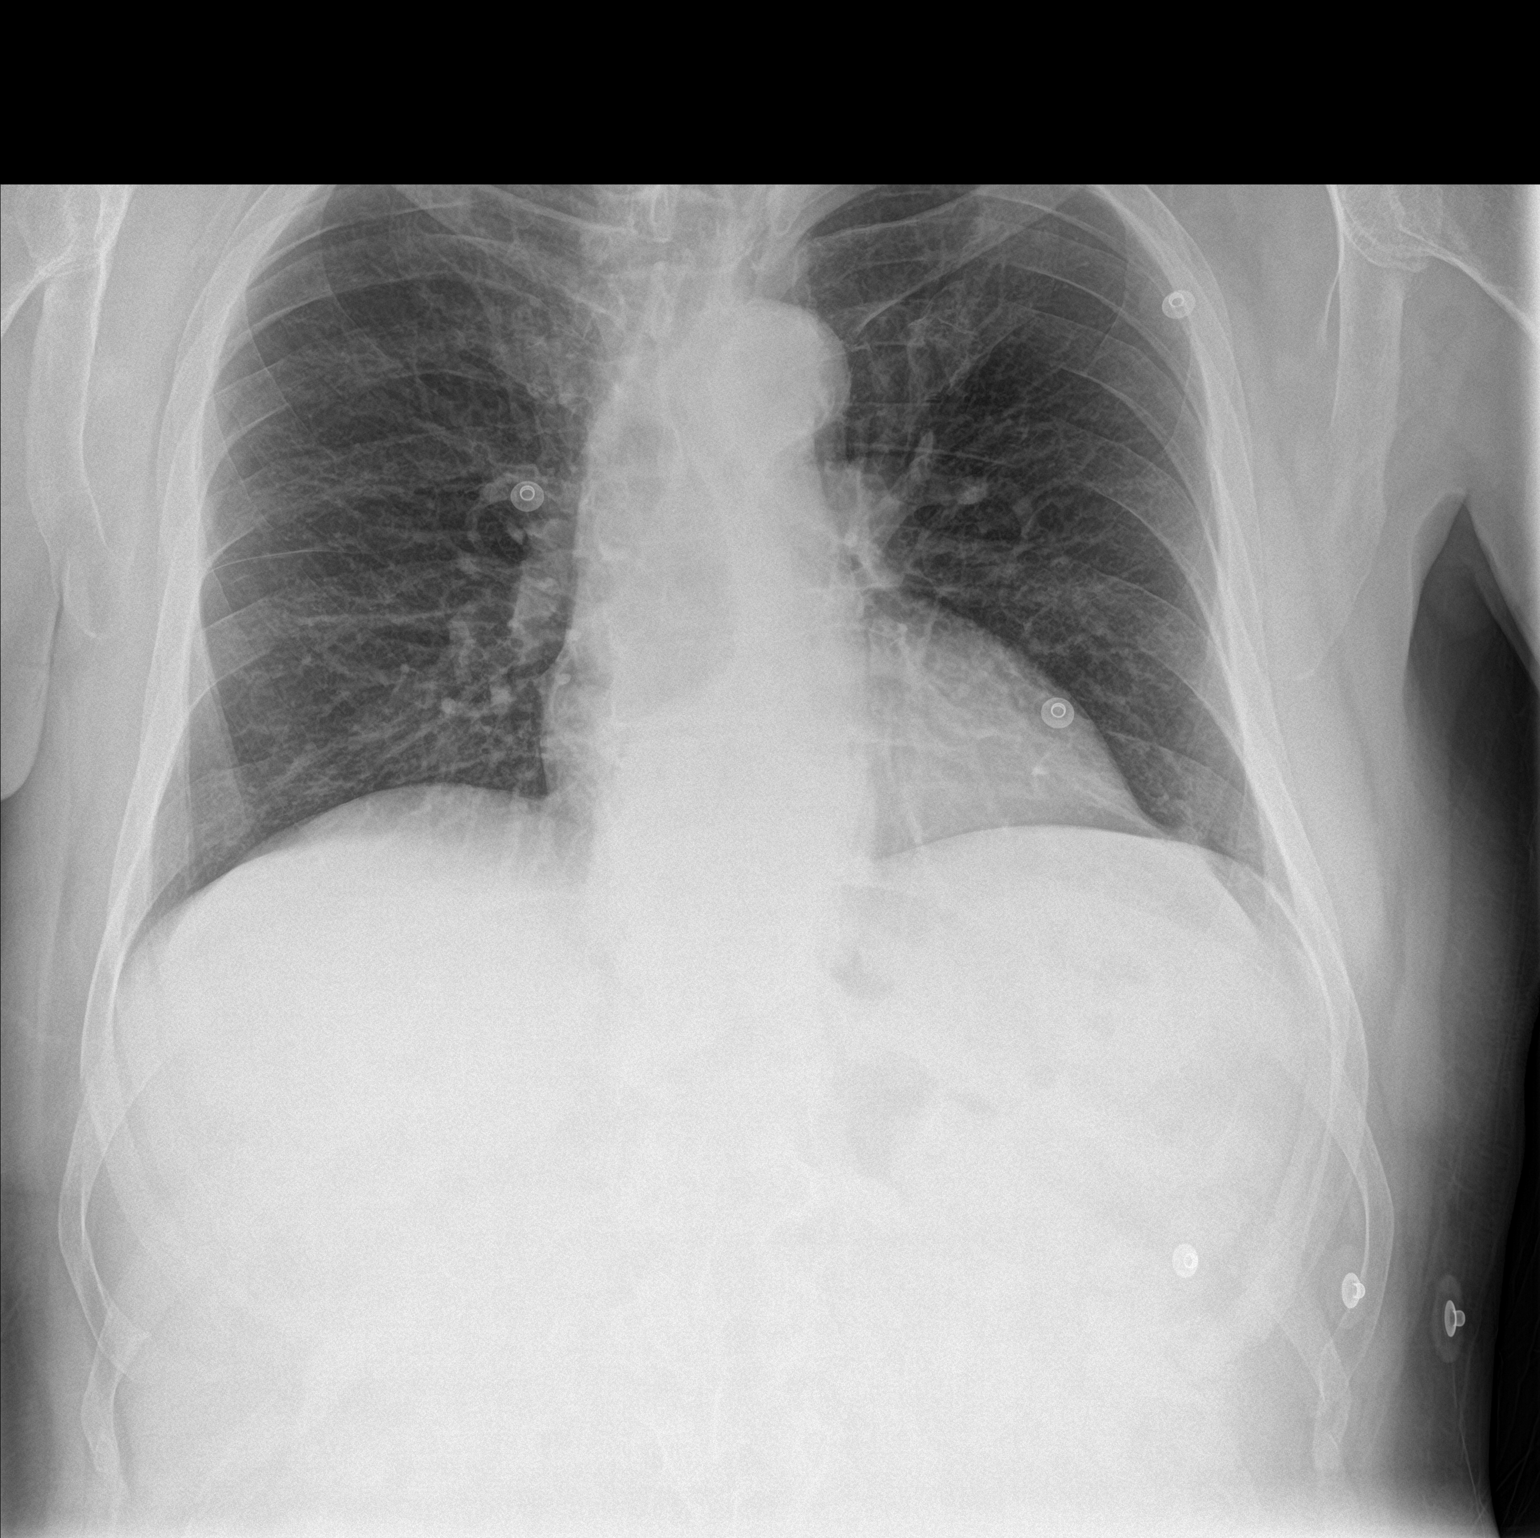

[chest lat]
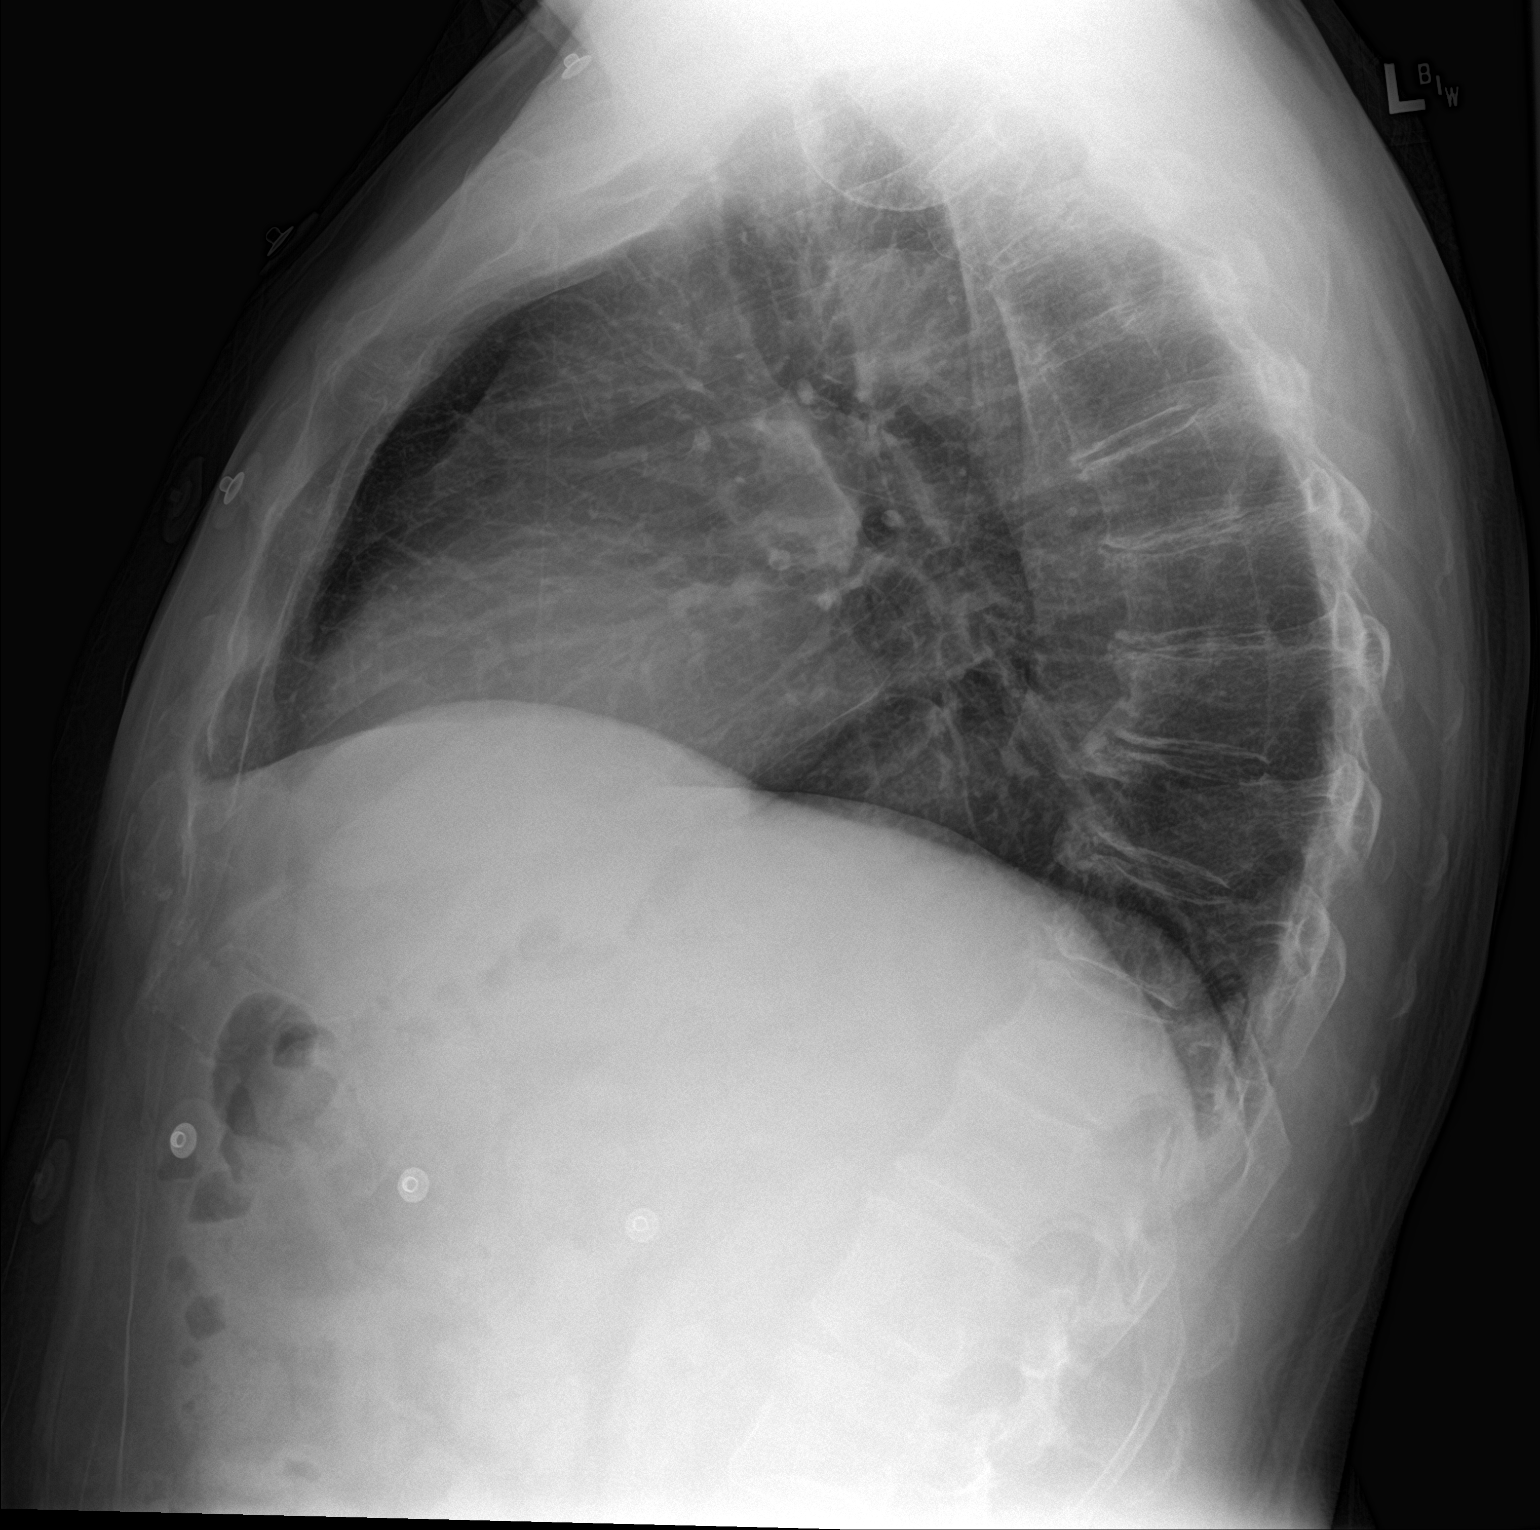

[2 of 2 positions shown; findings below may reference images not displayed]

FINDINGS: The lungs are clear. Heart size is normal. No pneumothorax or
pleural effusion. No focal bony abnormality with thoracic
spondylosis noted. Degenerative change is seen about the shoulders.
IMPRESSION: No acute disease.

## 2015-09-27 IMAGING — CT CT HEAD W/O CM
3 of 4 series · 16 of 47 positions shown, 19 images · non-contrast
Comparison: None.

CLINICAL DATA: Pt passed out i[REDACTED] this am. Did not fall or hit
head This has happened before 5 or 6 times over 25 yrs

EXAM:
CT HEAD WITHOUT CONTRAST
TECHNIQUE: Contiguous axial images were obtained from the base of the skull
through the vertex without intravenous contrast.

[Series 3: head 2.0 h70h · axial · 0.41mm/px · z∈[-185,-43]mm · 10 of 79 slices shown, 13 images]
[im 4/79  brain]
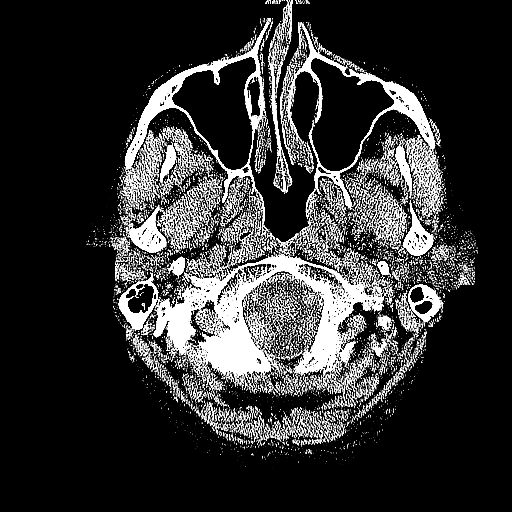
[im 4/79  bone]
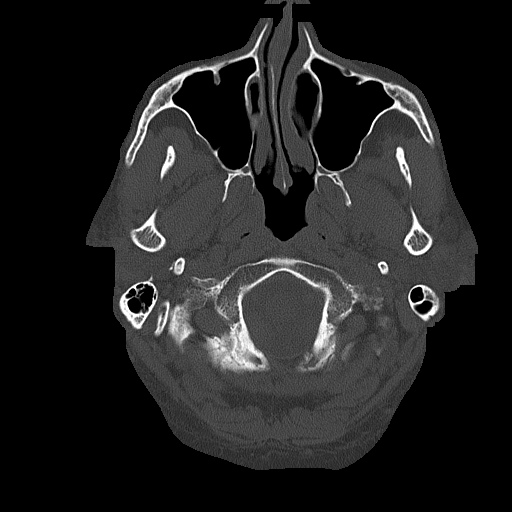
[im 12/79  brain]
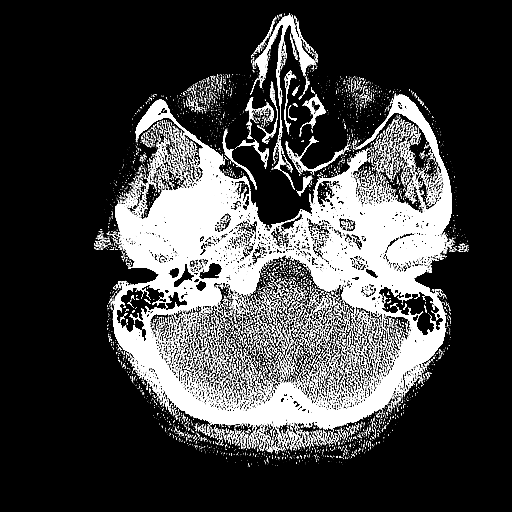
[im 20/79  brain]
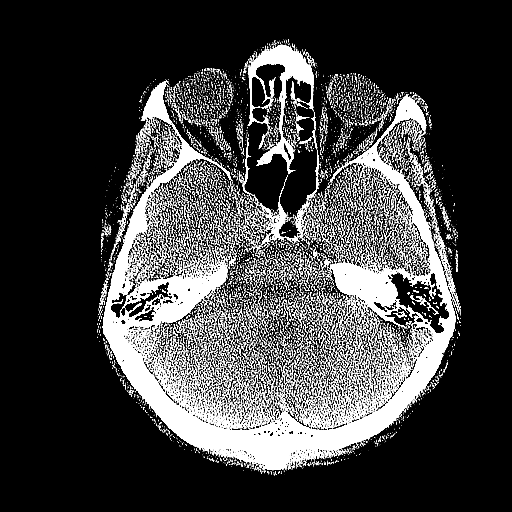
[im 28/79  brain]
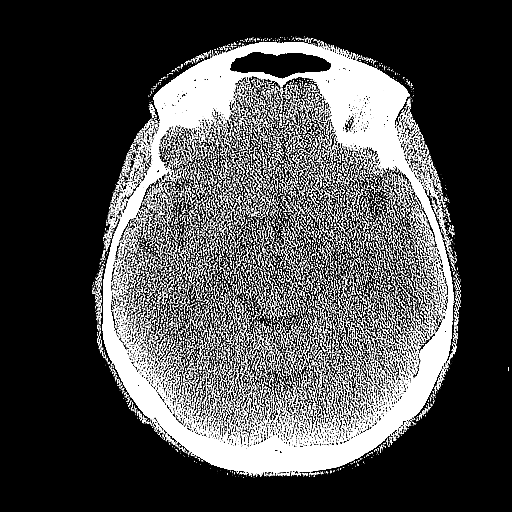
[im 36/79  brain]
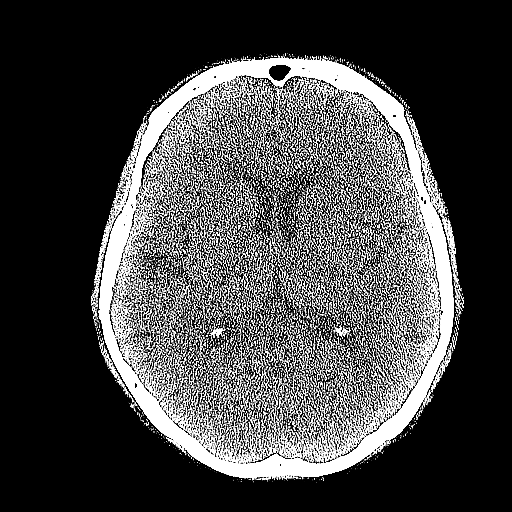
[im 36/79  bone]
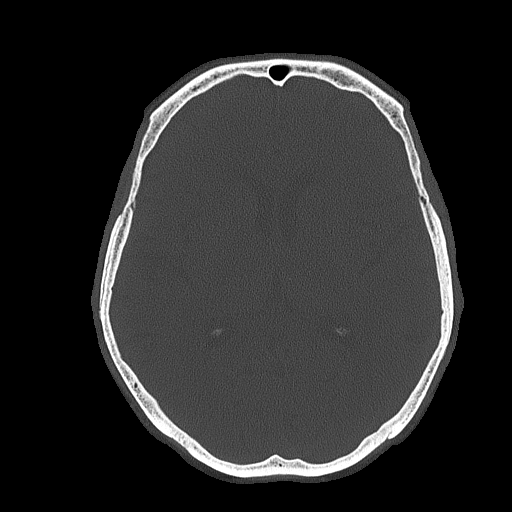
[im 43/79  brain]
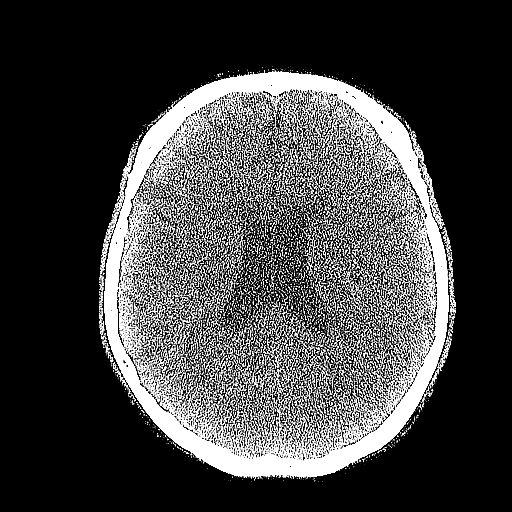
[im 51/79  brain]
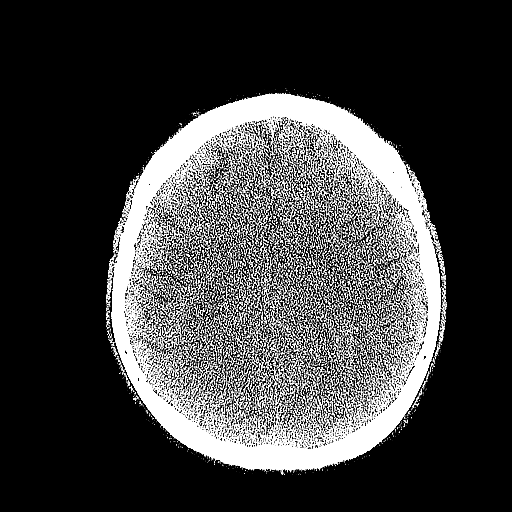
[im 59/79  brain]
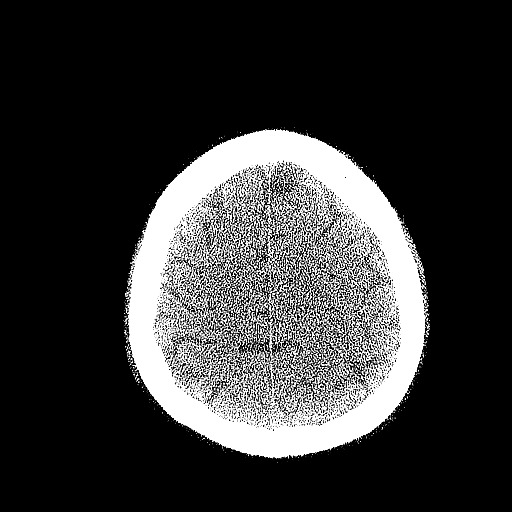
[im 67/79  brain]
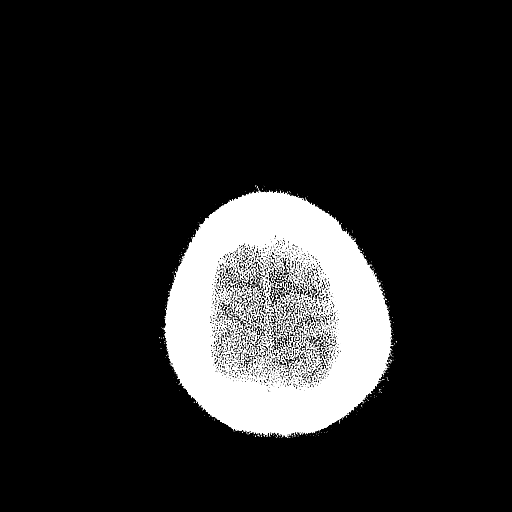
[im 67/79  bone]
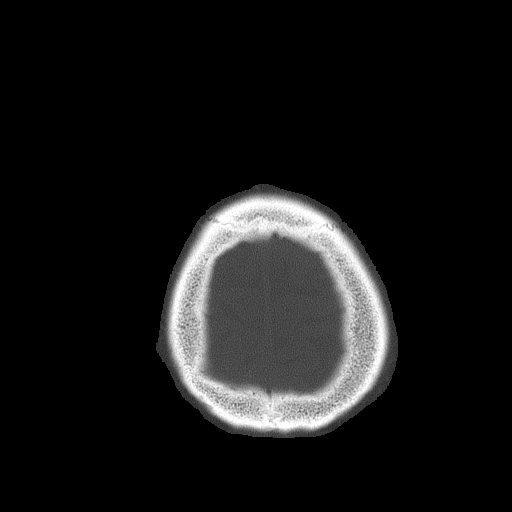
[im 75/79  brain]
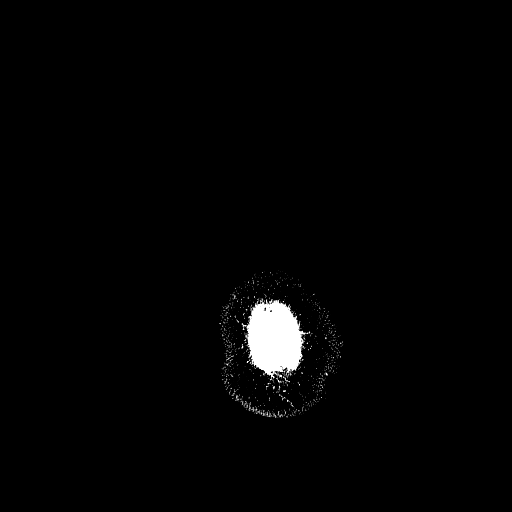

[Series 4: head 3.0 mpr · coronal · 0.29mm/px · 3 of 66 slices shown (1 of 2)]
[im 22/66  brain]
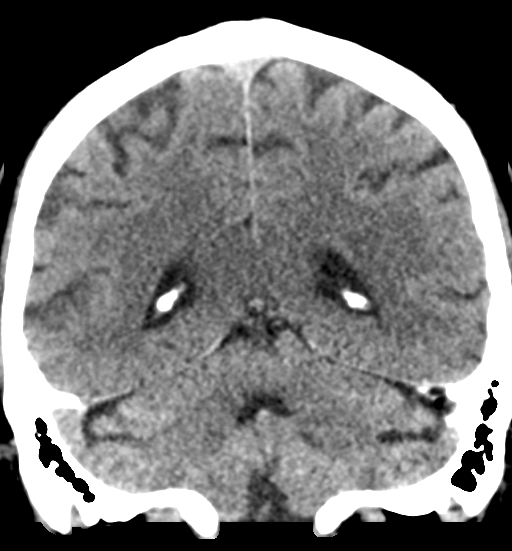
[im 29/66  brain]
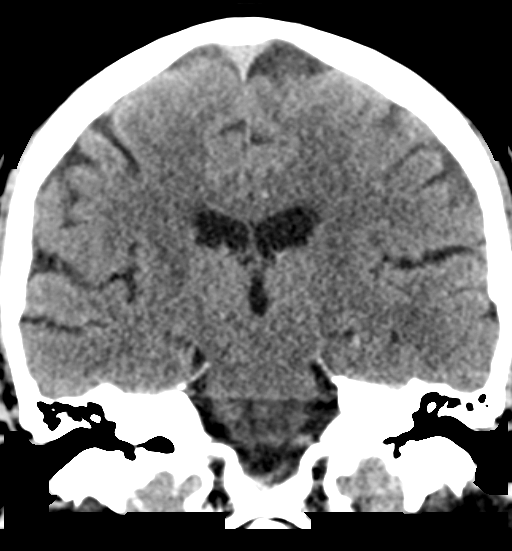
[im 37/66  brain]
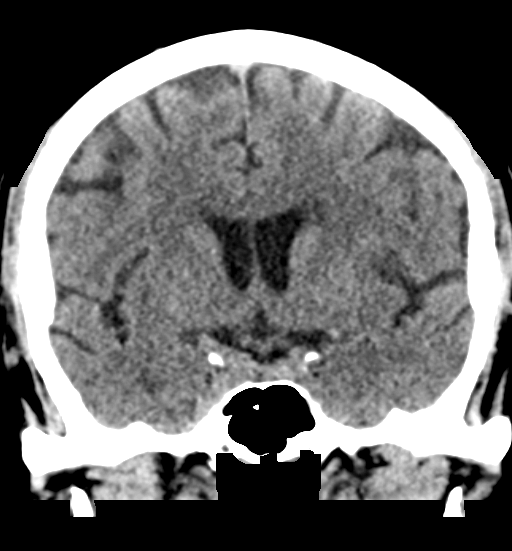

[Series 5: head 3.0 mpr · sagittal · 0.31mm/px · 3 of 66 slices shown (2 of 2)]
[im 22/66  brain]
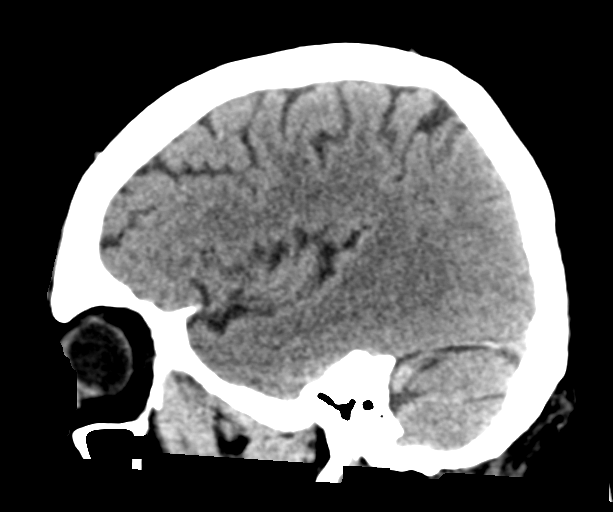
[im 33/66  brain]
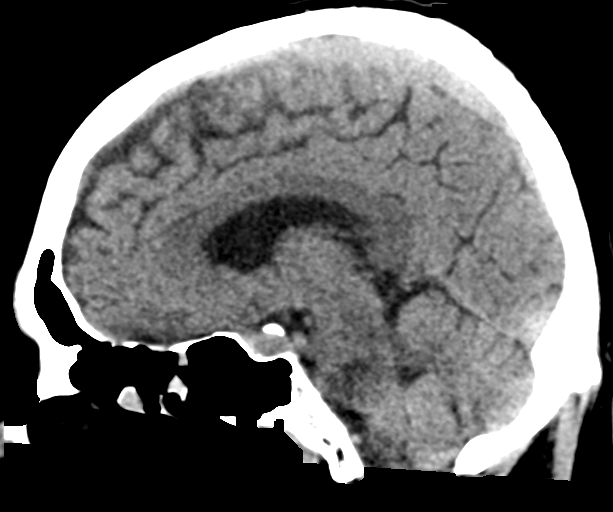
[im 44/66  brain]
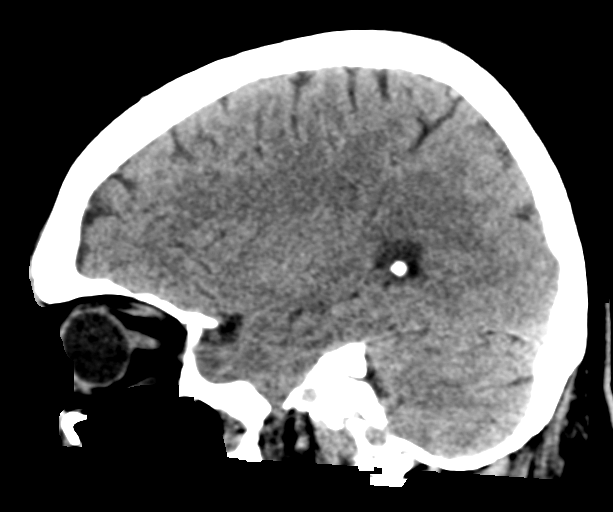

[16 of 47 positions shown; findings below may reference images not displayed]

FINDINGS: Brain: No evidence of acute infarction, hemorrhage, hydrocephalus,
extra-axial collection or mass lesion/mass effect.

Vascular: No hyperdense vessel or unexpected calcification.

Skull: Intact.

Sinuses/Orbits: Mild mucosal thickening of the paranasal sinuses.
Mastoid air cells are normally aerated.

Other: None
IMPRESSION: No evidence for acute intracranial abnormality. Paranasal sinus
inflammation.

## 2015-09-27 MED ORDER — SODIUM CHLORIDE 0.9 % IV BOLUS (SEPSIS)
1000.0000 mL | Freq: Once | INTRAVENOUS | Status: AC
  Administered 2015-09-27: 1000 mL via INTRAVENOUS

## 2015-09-27 MED ORDER — SODIUM CHLORIDE 0.9 % IV SOLN: INTRAVENOUS | Status: DC

## 2015-09-27 NOTE — ED Triage Notes (Signed)
PT was at church this AM when he passed out. Pt did nor fall to the ground or hit his head. SBP was 80 initial and dropped to 70SBP when he stood up. Pt AO on arrival to ED.

## 2015-09-27 NOTE — ED Notes (Signed)
Pt has gone to x-ray ?

## 2015-09-27 NOTE — ED Provider Notes (Signed)
[facility] DEPT Provider Note   CSN: [id number] Arrival date & time: 09/27/15  1342     History   Chief Complaint Chief Complaint  Patient presents with  . Loss of Consciousness    HPI Roy [name] is a 70 y.o. male.  Pt was at church this morning when he passed out.  He was getting ready to serve communion and told his wife that he did not feel well.  She and some others were able to assist him to the front pew and so he did not fall.  His wife said he became very sweaty and passed out a few times.  The pt's SBP was 80 initially and dropped to 70 when he stood up.  This has happened to the patient a few times in the past and there has been no cause found.  The pt's medications are all old with the exception of a new medication that is a clinical trial.  It is pemafibrate which is supposed to reduce triglycerides in patients with diabetes.  The investigator is Deland Pretty, MD.  Pt is unsure if he has the placebo or the drug.  Syncope is not listed as a side effect of the drug.  Pt denies any other sx.  He said that he feels fine now.      Past Medical History:  Diagnosis Date  . Allergic rhinitis   . Diabetes mellitus   . GERD (gastroesophageal reflux disease)   . Hyperlipidemia   . Hypertension   . Hypovitaminosis D   . Nephrolithiasis   . Plantar fasciitis   . Syncope     Patient Active Problem List   Diagnosis Date Noted  . Hyperlipidemia 01/30/2015  . Nonischemic cardiomyopathy ([place]) 01/30/2015  . Essential hypertension 01/30/2015  . Faintness 01/30/2015  . Chest pain 10/24/2014  . Cardiomyopathy ([place]) 10/24/2014  . Allergic rhinitis   . Syncope   . Nephrolithiasis   . Plantar fasciitis   . Hypovitaminosis D     Past Surgical History:  Procedure Laterality Date  . COLONOSCOPY  07/20/2007  . SHOULDER SURGERY     Bil/scar tissue in joints  . 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 TONSILLECTOMY AND ADENOIDECTOMY          Home Medications    Prior to Admission medications   Medication Sig Start Date End Date Taking? Authorizing Provider  amLODipine (NORVASC) 10 MG tablet Take 10 mg by mouth daily.   Yes Historical Provider, MD  aspirin 81 MG tablet as directed. Take 1 tablet by mouth 5 times weekly.   Yes Historical Provider, MD  carvedilol (COREG) 3.125 MG tablet Take 1 tablet (3.125 mg total) by mouth 2 (two) times daily. 11/04/14  Yes Dorothy Spark, MD  cholecalciferol (VITAMIN D) 1000 UNITS tablet Take 1,000 Units by mouth every evening. Vit D3  2000 IU-Take a pill daily   Yes Historical Provider, MD  glucosamine-chondroitin 500-400 MG tablet Take 1 tablet by mouth. 5 times weekly   Yes Historical Provider, MD  lisinopril (PRINIVIL,ZESTRIL) 40 MG tablet Take 1 tablet (40 mg total) by mouth daily. 11/04/14  Yes Dorothy Spark, MD  metFORMIN (GLUCOPHAGE) 1000 MG tablet Take 1,000 mg by mouth 2 (two) times daily with a meal.    Yes Historical Provider, MD  Multiple Vitamin (MULTIVITAMIN WITH MINERALS) TABS tablet Take 1 tablet by mouth. 5 times weekly   Yes Historical Provider, MD  omeprazole (PRILOSEC) 20 MG capsule  Take 20 mg by mouth daily.   Yes Historical Provider, MD  pioglitazone (ACTOS) 30 MG tablet Take 30 mg by mouth daily.    Yes Historical Provider, MD  pravastatin (PRAVACHOL) 40 MG tablet Take 80 mg by mouth at bedtime.   Yes Historical Provider, MD    Family History Family History  Problem Relation Age of Onset  . Heart attack Mother   . Stroke Father     Social History Social History  Substance Use Topics  . Smoking status: Former Smoker    Types: Pipe    Quit date: 11/29/1976  . Smokeless tobacco: Never Used  . Alcohol use No     Allergies   Review of patient's allergies indicates no known allergies.   Review of Systems Review of Systems  Neurological: Positive for syncope.  All other systems reviewed and are negative.    Physical Exam Updated Vital  Signs BP 140/85   Pulse 92   Temp 97.4 F (36.3 C) (Oral)   Resp 12   SpO2 100%   Physical Exam  Constitutional: He is oriented to person, place, and time. He appears well-developed and well-nourished.  HENT:  Head: Normocephalic and atraumatic.  Right Ear: External ear normal.  Left Ear: External ear normal.  Nose: Nose normal.  Mouth/Throat: Oropharynx is clear and moist.  Eyes: Conjunctivae and EOM are normal. Pupils are equal, round, and reactive to light.  Neck: Normal range of motion. Neck supple.  Cardiovascular: Normal rate, regular rhythm, normal heart sounds and intact distal pulses.   Pulmonary/Chest: Effort normal and breath sounds normal.  Abdominal: Soft. Bowel sounds are normal.  Musculoskeletal: Normal range of motion.  Neurological: He is alert and oriented to person, place, and time.  Skin: Skin is warm and dry.  Psychiatric: He has a normal mood and affect. His behavior is normal. Judgment and thought content normal.  Nursing note and vitals reviewed.    ED Treatments / Results  Labs (all labs ordered are listed, but only abnormal results are displayed) Labs Reviewed  CBC WITH DIFFERENTIAL/PLATELET - Abnormal; Notable for the following:       Result Value   WBC 12.9 (*)    Hemoglobin 12.5 (*)    Neutro Abs 10.7 (*)    All other components within normal limits  COMPREHENSIVE METABOLIC PANEL - Abnormal; Notable for the following:    Glucose, Bld 123 (*)    Calcium 8.8 (*)    ALT 11 (*)    All other components within normal limits  PROTIME-INR  TROPONIN I  URINALYSIS, ROUTINE W REFLEX MICROSCOPIC (NOT AT [facility])  POCT CBG (FASTING - GLUCOSE)-MANUAL ENTRY    EKG  EKG Interpretation  Date/Time:  Sunday September 27 2015 13:44:21 EDT Ventricular Rate:  68 PR Interval:    QRS Duration: 116 QT Interval:  415 QTC Calculation: 442 R Axis:   -27 Text Interpretation:  Sinus rhythm LVH with IVCD and secondary repol abnrm No significant change since last  tracing Confirmed by [facility] MD, Ermagene Saidi ([id number]) on 09/27/2015 3:42:43 PM       Radiology Dg Chest 2 View  Result Date: 09/27/2015 CLINICAL DATA:  Syncope today.  Hypotension. EXAM: CHEST  2 VIEW COMPARISON:  PA and lateral chest 05/22/2003. FINDINGS: The lungs are clear. Heart size is normal. No pneumothorax or pleural effusion. No focal bony abnormality with thoracic spondylosis noted. Degenerative change is seen about the shoulders. IMPRESSION: No acute disease. Electronically Signed   By: Inge Rise  M.D.   On: 09/27/2015 16:01   Ct Head Wo Contrast  Result Date: 09/27/2015 CLINICAL DATA:  Pt passed out in [place] this am. Did not fall or hit head This has happened before 5 or 6 times over 25 yrs EXAM: CT HEAD WITHOUT CONTRAST TECHNIQUE: Contiguous axial images were obtained from the base of the skull through the vertex without intravenous contrast. COMPARISON:  None. FINDINGS: Brain: No evidence of acute infarction, hemorrhage, hydrocephalus, extra-axial collection or mass lesion/mass effect. Vascular: No hyperdense vessel or unexpected calcification. Skull: Intact. Sinuses/Orbits: Mild mucosal thickening of the paranasal sinuses. Mastoid air cells are normally aerated. Other: None IMPRESSION: No evidence for acute intracranial abnormality. Paranasal sinus inflammation. Electronically Signed   By: Nolon Nations M.D.   On: 09/27/2015 15:54    Procedures Procedures (including critical care time)  Medications Ordered in ED Medications  sodium chloride 0.9 % bolus 1,000 mL (1,000 mLs Intravenous New Bag/Given 09/27/15 1605)    And  0.9 %  sodium chloride infusion (not administered)     Initial Impression / Assessment and Plan / ED Course  I have reviewed the triage vital signs and the nursing notes.  Pertinent labs & imaging results that were available during my care of the patient were reviewed by me and considered in my medical decision making (see chart for details).  Clinical  Course   Pt has been asymptomatic here.  I left a message with Shary Key who is the clinical trial coordinator.  Pt has had syncope in the past and work ups have been ok.  Pt's BP was low when this occurred, so I recommended compression stockings.  Pt knows to f/u with his pcp.  Final Clinical Impressions(s) / ED Diagnoses   Final diagnoses:  Syncope, unspecified syncope type  Orthostatic hypotension    New Prescriptions New Prescriptions   No medications on file     Isla Pence, MD 09/27/15 1654

## 2015-09-27 NOTE — ED Notes (Signed)
The pt just returned from xray iv fluid connected  Pt has many questions and so does family  Too soon to know  If and when they are going home

## 2015-10-28 ENCOUNTER — Other Ambulatory Visit: Payer: Self-pay | Admitting: Cardiology

## 2015-12-04 ENCOUNTER — Telehealth: Payer: Self-pay | Admitting: *Deleted

## 2015-12-04 DIAGNOSIS — R55 Syncope and collapse: Secondary | ICD-10-CM

## 2015-12-04 NOTE — Telephone Encounter (Signed)
Spoke to the pt and informed him that we have scheduled for him to see Ellen Henri PA-C at 0800 on 12/21/15.  Per the pt, he states he will call us back on Monday to let us know if this date works for him or not. Pt states he doesn't have his calendar in front of him right now.

## 2015-12-04 NOTE — Telephone Encounter (Signed)
-----   Message from Dorothy Spark, MD sent at 12/04/2015  2:42 PM EST ----- The patient was just seen in the ER for an episode of syncope, please bring him in the next 2 weeks to see a PA and possibly schedule a 30 day event monitor at that visit. KN

## 2015-12-04 NOTE — Telephone Encounter (Signed)
Left a message for the pt to call back to inform him that Dr [name] reviewed his note he brought by the office, and being he was just seen in the ER for an episode of syncope, she recommends that he come into the office to see an Extender in 2 weeks, and possibly have an event monitor ordered at that visit.  Went ahead and placed the pt on [place] PA-C schedule at 0800 pm 12/21/15.

## 2015-12-07 NOTE — Addendum Note (Signed)
Addended by: Nuala Alpha on: 12/07/2015 11:26 AM   Modules accepted: Orders

## 2015-12-07 NOTE — Telephone Encounter (Signed)
Left a detailed message for the pt to call back to confirm appt made on 11/27 at 0800 and event monitor appt same day at 0900, for pts complaints of syncope.

## 2015-12-07 NOTE — Telephone Encounter (Signed)
Spoke with the pt, and he is aware of both appointments made on 11/27.  Pt is aware that he will first see Ellen Henri PA-C at 0800, then he will report to his event monitor appt at 0900.   Will route this message to [place] as an FYI, for when she see's this pt.  Pt verbalized understanding and agrees with this plan.

## 2015-12-21 ENCOUNTER — Ambulatory Visit (INDEPENDENT_AMBULATORY_CARE_PROVIDER_SITE_OTHER): Payer: Medicare Other | Admitting: Cardiology

## 2015-12-21 ENCOUNTER — Ambulatory Visit (INDEPENDENT_AMBULATORY_CARE_PROVIDER_SITE_OTHER): Payer: Medicare Other

## 2015-12-21 ENCOUNTER — Encounter: Payer: Self-pay | Admitting: Cardiology

## 2015-12-21 VITALS — BP 140/92 | HR 81 | Ht 73.0 in | Wt 227.5 lb

## 2015-12-21 DIAGNOSIS — R55 Syncope and collapse: Secondary | ICD-10-CM

## 2015-12-21 DIAGNOSIS — I10 Essential (primary) hypertension: Secondary | ICD-10-CM | POA: Diagnosis not present

## 2015-12-21 DIAGNOSIS — I428 Other cardiomyopathies: Secondary | ICD-10-CM | POA: Diagnosis not present

## 2015-12-21 NOTE — Patient Instructions (Addendum)
Medication Instructions:  Your physician recommends that you continue on your current medications as directed. Please refer to the Current Medication list given to you today.   Labwork: None ordered  Testing/Procedures: None ordered  Follow-Up: Your physician recommends that you schedule a follow-up appointment in: [place], PA-C   Any Other Special Instructions Will Be Listed Below (If Applicable).   If you need a refill on your cardiac medications before your next appointment, please call your pharmacy.

## 2015-12-21 NOTE — Progress Notes (Signed)
12/21/2015 [name]   1945/03/24  [id number]  Primary Physician Jani Gravel, MD Primary Cardiologist: Dr. [name]  Reason for Visit/CC: Post ED Visit for Syncope   HPI:  70 y.o. male with h/o DM2, HTN and HLD. He is followed by Dr. [name]. He also has been evaluated in the past for recurrent syncope. He has had multiple unexplained episodes in his lifetime. He went to the ER on two occasions and work up was negative. He had an echocardiogram September 2016  that showed mildly impaired LVEF 45-50% with regional wall motion abnormalities in the mid LAD distribution but has had no symptoms of chest pain. In October 2016 he had a negative nuclear stress test. He had a repeat 2D echo recently on 08/03/15 that showed improvement in LVEF to low normal at 50-55%.    He recently presented to the ED 2 months ago, 09/2015, with another episode of syncope. He was in his usual state of health. He was standing in church when he suddenly felt unwell. Told his wife he felt like the was going to pass out. She assisted him to a chair. She reports that he briefly loss consciousness for several seconds. He denies felling hot at the time. No associated CP, palpations nor dyspnea. He was sent to the ED for evaluation .CT scan of the head was unremarkable. He was hypotensive with initial SBP in the 80s that improved with IVFs. He did not require admission. He was discharged home from the ED. He called the office and was instructed to come in for evaluation and placement of a cardiac event monitor. He denies any further recurrence since that time. He does note that he is enrolled in a research study and is taking a medication. He is a blind participant and does not known the name of the drug. He started taking it roughly 3 months prior to his syncopal spell. He has had no other side effects.   EKG today shows NSR. HR 81 bpm. BP 140/92.     Current Meds  Medication Sig  . amLODipine (NORVASC) 10 MG tablet Take 10 mg by  mouth daily.  [name] aspirin 81 MG tablet as directed. Take 1 tablet by mouth 5 times weekly.  . carvedilol (COREG) 3.125 MG tablet TAKE 1 TABLET BY MOUTH 2 TIMES DAILY.  . cholecalciferol (VITAMIN D) 1000 UNITS tablet Take 1,000 Units by mouth every evening. Vit D3  2000 IU-Take a pill daily  . glucosamine-chondroitin 500-400 MG tablet Take 1 tablet by mouth. 5 times weekly  . lisinopril (PRINIVIL,ZESTRIL) 40 MG tablet TAKE 1 TABLET (40 MG TOTAL) BY MOUTH DAILY.  . metFORMIN (GLUCOPHAGE) 1000 MG tablet Take 1,000 mg by mouth 2 (two) times daily with a meal.   . Multiple Vitamin (MULTIVITAMIN WITH MINERALS) TABS tablet Take 1 tablet by mouth. 5 times weekly  . omeprazole (PRILOSEC) 20 MG capsule Take 20 mg by mouth daily.  . pioglitazone (ACTOS) 30 MG tablet Take 30 mg by mouth daily.   . pravastatin (PRAVACHOL) 40 MG tablet Take 80 mg by mouth at bedtime.   No Known Allergies Past Medical History:  Diagnosis Date  . Allergic rhinitis   . Diabetes mellitus   . GERD (gastroesophageal reflux disease)   . Hyperlipidemia   . Hypertension   . Hypovitaminosis D   . Nephrolithiasis   . Plantar fasciitis   . Syncope    Family History  Problem Relation Age of Onset  . Heart attack Mother   .  Stroke Father    Past Surgical History:  Procedure Laterality Date  . COLONOSCOPY  07/20/2007  . SHOULDER SURGERY     Bil/scar tissue in joints  . 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 TONSILLECTOMY AND ADENOIDECTOMY     Social History   Social History  . Marital status: Married    Spouse name: N/A  . Number of children: N/A  . Years of education: N/A   Occupational History  . Not on file.   Social History Main Topics  . Smoking status: Former Smoker    Types: Pipe    Quit date: 11/29/1976  . Smokeless tobacco: Never Used  . Alcohol use No  . Drug use: No  . Sexual activity: Not on file   Other Topics Concern  . Not on file   Social History Narrative    . No narrative on file     Review of Systems: General: negative for chills, fever, night sweats or weight changes.  Cardiovascular: negative for chest pain, dyspnea on exertion, edema, orthopnea, palpitations, paroxysmal nocturnal dyspnea or shortness of breath Dermatological: negative for rash Respiratory: negative for cough or wheezing Urologic: negative for hematuria Abdominal: negative for nausea, vomiting, diarrhea, bright red blood per rectum, melena, or hematemesis Neurologic: negative for visual changes, syncope, or dizziness All other systems reviewed and are otherwise negative except as noted above.   Physical Exam:  Blood pressure (!) 140/92, pulse 81, height 6\' 1"  (1.854 m), weight 227 lb 8 oz (103.2 kg).  General appearance: alert, cooperative and no distress Neck: no carotid bruit and no JVD Lungs: clear to auscultation bilaterally Heart: regular rate and rhythm, S1, S2 normal, no murmur, click, rub or gallop Extremities: extremities normal, atraumatic, no cyanosis or edema Pulses: 2+ and symmetric Skin: Skin color, texture, turgor normal. No rashes or lesions Neurologic: Grossly normal  EKG NSR. 81 bpm   ASSESSMENT AND PLAN:   1. Syncope: W/u in the ED revealed normal head CT and mild hypotension that improved with IVFs. He denies any recurrent symptoms but has a h/o recurrent syncope. He notes 7 unexplained episodes in his lifetime. NST 10/2014 was negative for ischemia. 2D echo 07/2015 showed normal LVEF at 50-55%. He has never worn a heart monitor. We will arrange for a 30 day monitor to rule out the presence of electrical abnormalities.   PLAN  F/u in 6 weeks with Dr. [name] after monitor.   Lyda Jester PA-C 12/21/2015 8:35 AM

## 2016-02-15 ENCOUNTER — Ambulatory Visit (INDEPENDENT_AMBULATORY_CARE_PROVIDER_SITE_OTHER): Payer: Medicare HMO | Admitting: Cardiology

## 2016-02-15 ENCOUNTER — Encounter: Payer: Self-pay | Admitting: Cardiology

## 2016-02-15 VITALS — BP 126/76 | HR 76 | Ht 73.0 in | Wt 225.0 lb

## 2016-02-15 DIAGNOSIS — I428 Other cardiomyopathies: Secondary | ICD-10-CM | POA: Diagnosis not present

## 2016-02-15 DIAGNOSIS — R55 Syncope and collapse: Secondary | ICD-10-CM

## 2016-02-15 DIAGNOSIS — I10 Essential (primary) hypertension: Secondary | ICD-10-CM

## 2016-02-15 DIAGNOSIS — E782 Mixed hyperlipidemia: Secondary | ICD-10-CM

## 2016-02-15 NOTE — Patient Instructions (Signed)
Medication Instructions:  The current medical regimen is effective;  continue present plan and medications.  Follow-Up: Follow up in 1 year with Dr. [name].  You will receive a letter in the mail 2 months before you are due.  Please call us when you receive this letter to schedule your follow up appointment.  If you need a refill on your cardiac medications before your next appointment, please call your pharmacy.  Thank you for choosing [facility]!!

## 2016-02-15 NOTE — Progress Notes (Addendum)
02/15/2016 Roy [name]   [birth year]  [id number]  Primary Physician Jani Gravel, MD Primary Cardiologist: Dr. [name]  Reason for Visit/CC: Post ED Visit for Syncope   HPI:  71 y.o. male with h/o DM2, HTN and HLD. He is followed by Dr. [name]. He also has been evaluated in the past for recurrent syncope. He has had multiple unexplained episodes in his lifetime. He went to the ER on two occasions and work up was negative. He had an echocardiogram September 2016  that showed mildly impaired LVEF 45-50% with regional wall motion abnormalities in the mid LAD distribution but has had no symptoms of chest pain. In October 2016 he had a negative nuclear stress test. He had a repeat 2D echo recently on 08/03/15 that showed improvement in LVEF to low normal at 50-55%.    He presented to the ED in 09/2015, with another episode of syncope. He was in his usual state of health. He was standing in church when he suddenly felt unwell. Told his wife he felt like the was going to pass out. She assisted him to a chair. She reports that he briefly loss consciousness for several seconds. He denies felling hot at the time. No associated CP, palpations nor dyspnea. He was sent to the ED for evaluation .CT scan of the head was unremarkable. He was hypotensive with initial SBP in the 80s that improved with IVFs. He did not require admission. He was discharged home from the ED. He called the office and was instructed to come in for evaluation and placement of a cardiac event monitor. He denies any further recurrence since that time. He does note that he is enrolled in a research study and is taking a medication. He is a blind participant and does not known the name of the drug. He started taking it roughly 3 months prior to his syncopal spell. He has had no other side effects.   02/15/2016 - 2 months follow-up, in the interim the patient had no more syncopal episodes. He underwent 30 day event monitoring that showed no arrhythmias  or pauses. He has been very active and denies any chest pain or shortness of breath. No side effects from pravastatin. He had blood for done in September that showed normal electrolytes, kidney, normal liver function and normal CBC. We don't have lipids will request from his primary care physician. He has been compliant to his medicines.  Current Meds  Medication Sig  . amLODipine (NORVASC) 10 MG tablet Take 10 mg by mouth daily.  [name] aspirin 81 MG tablet as directed. Take 1 tablet by mouth 5 times weekly.  . carvedilol (COREG) 3.125 MG tablet TAKE 1 TABLET BY MOUTH 2 TIMES DAILY.  . cholecalciferol (VITAMIN D) 1000 UNITS tablet Take 1,000 Units by mouth every evening. Vit D3  2000 IU-Take a pill daily  . glucosamine-chondroitin 500-400 MG tablet Take 1 tablet by mouth. 5 times weekly  . lisinopril (PRINIVIL,ZESTRIL) 40 MG tablet TAKE 1 TABLET (40 MG TOTAL) BY MOUTH DAILY.  . metFORMIN (GLUCOPHAGE) 1000 MG tablet Take 1,000 mg by mouth 2 (two) times daily with a meal.   . Multiple Vitamin (MULTIVITAMIN WITH MINERALS) TABS tablet Take 1 tablet by mouth. 5 times weekly  . omeprazole (PRILOSEC) 20 MG capsule Take 20 mg by mouth daily.  . pioglitazone (ACTOS) 30 MG tablet Take 30 mg by mouth daily.   . pravastatin (PRAVACHOL) 40 MG tablet Take 40 mg by mouth at bedtime.  No Known Allergies Past Medical History:  Diagnosis Date  . Allergic rhinitis   . Diabetes mellitus   . GERD (gastroesophageal reflux disease)   . Hyperlipidemia   . Hypertension   . Hypovitaminosis D   . Nephrolithiasis   . Plantar fasciitis   . Syncope    Family History  Problem Relation Age of Onset  . Heart attack Mother   . Stroke Father    Past Surgical History:  Procedure Laterality Date  . COLONOSCOPY  07/20/2007  . SHOULDER SURGERY     Bil/scar tissue in joints  . 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 TONSILLECTOMY AND ADENOIDECTOMY     Social History   Social History    . Marital status: Married    Spouse name: N/A  . Number of children: N/A  . Years of education: N/A   Occupational History  . Not on file.   Social History Main Topics  . Smoking status: Former Smoker    Types: Pipe    Quit date: 11/29/1976  . Smokeless tobacco: Never Used  . Alcohol use No  . Drug use: No  . Sexual activity: Not on file   Other Topics Concern  . Not on file   Social History Narrative  . No narrative on file     Review of Systems: General: negative for chills, fever, night sweats or weight changes.  Cardiovascular: negative for chest pain, dyspnea on exertion, edema, orthopnea, palpitations, paroxysmal nocturnal dyspnea or shortness of breath Dermatological: negative for rash Respiratory: negative for cough or wheezing Urologic: negative for hematuria Abdominal: negative for nausea, vomiting, diarrhea, bright red blood per rectum, melena, or hematemesis Neurologic: negative for visual changes, syncope, or dizziness All other systems reviewed and are otherwise negative except as noted above.   Physical Exam:  Blood pressure 126/76, pulse 76, height 6\' 1"  (1.854 m), weight 225 lb (102.1 kg).  General appearance: alert, cooperative and no distress Neck: no carotid bruit and no JVD Lungs: clear to auscultation bilaterally Heart: regular rate and rhythm, S1, S2 normal, no murmur, click, rub or gallop Extremities: extremities normal, atraumatic, no cyanosis or edema Pulses: 2+ and symmetric Skin: Skin color, texture, turgor normal. No rashes or lesions Neurologic: Grossly normal  EKG NSR. 81 bpm   ASSESSMENT AND PLAN:   1. Syncope: W/u in the ED revealed normal head CT and mild hypotension that improved with IVFs. He denies any recurrent symptoms but has a h/o recurrent syncope. He notes 7 unexplained episodes in his lifetime. NST 10/2014 was negative for ischemia. 2D echo 07/2015 showed normal LVEF at 50-55%. A 30 day monitor showed sinus bradycardia to  sinus rhythm with no arrhythmias and no pauses. Carotid ultrasound in September 2016 showed only minimal plaque in the right carotid artery. If he has recurrent episodes we will advice to get a loop monitor.  2. 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   3. Hypertension - controlled.  4. HLP - TG high, on pravastatin, we will request most recent lipids from his primary care physician Dr. Maudie Mercury.  Follow up in 1 year.  Ena Dawley , MD 02/15/2016 8:19 AM   We have obtained blood work from his primary care physician from November 2017, there are normal LFTs, HDL 39, LDL 77 triglycerides 316. Normal hemoglobin. Normal creatinine. We will recommend to add fish oil -  lovaza 1000 mg po daily to his medications.   Ena Dawley 02/17/2016

## 2016-02-18 ENCOUNTER — Telehealth: Payer: Self-pay | Admitting: *Deleted

## 2016-02-18 MED ORDER — OMEGA-3-ACID ETHYL ESTERS 1 G PO CAPS: 1.0000 g | ORAL_CAPSULE | Freq: Every day | ORAL | 3 refills | Status: DC

## 2016-02-18 NOTE — Telephone Encounter (Signed)
PT AWARE OF RECOMMENDATIONS ./CY 

## 2016-02-18 NOTE — Telephone Encounter (Signed)
-----   Message from Leeroy Bock, [facility] sent at 02/18/2016  9:55 AM EST -----   ----- Message ----- From: Dorothy Spark, MD Sent: 02/18/2016   8:20 AM To: Megan E Supple, RPH  [facility] is on vacation, could you call them, if not, I will forward to triage, Thank you, K  ----- Message ----- From: Leeroy Bock, [place]: 02/18/2016   7:58 AM To: Dorothy Spark, MD  That sounds like a good plan - is Karlene Einstein calling the pt and sending in a prescription?  ----- Message ----- From: Dorothy Spark, MD Sent: 02/17/2016  10:46 AM To: Leeroy Bock, RPH  We have obtained blood work from his primary care physician from November 2017, there are normal LFTs, HDL 39, LDL 77 triglycerides 316. Normal hemoglobin. Normal creatinine. We will recommend to add fish oil - lovaza 1000 mg po daily to his medications.

## 2016-04-14 DIAGNOSIS — D649 Anemia, unspecified: Secondary | ICD-10-CM | POA: Diagnosis not present

## 2016-04-14 DIAGNOSIS — I10 Essential (primary) hypertension: Secondary | ICD-10-CM | POA: Diagnosis not present

## 2016-04-14 DIAGNOSIS — E119 Type 2 diabetes mellitus without complications: Secondary | ICD-10-CM | POA: Diagnosis not present

## 2016-04-20 DIAGNOSIS — E7800 Pure hypercholesterolemia, unspecified: Secondary | ICD-10-CM | POA: Diagnosis not present

## 2016-04-20 DIAGNOSIS — E119 Type 2 diabetes mellitus without complications: Secondary | ICD-10-CM | POA: Diagnosis not present

## 2016-04-20 DIAGNOSIS — I10 Essential (primary) hypertension: Secondary | ICD-10-CM | POA: Diagnosis not present

## 2016-04-20 DIAGNOSIS — D509 Iron deficiency anemia, unspecified: Secondary | ICD-10-CM | POA: Diagnosis not present

## 2016-04-25 ENCOUNTER — Other Ambulatory Visit: Payer: Self-pay | Admitting: Cardiology

## 2016-05-09 DIAGNOSIS — H0014 Chalazion left upper eyelid: Secondary | ICD-10-CM | POA: Diagnosis not present

## 2016-06-30 DIAGNOSIS — L570 Actinic keratosis: Secondary | ICD-10-CM | POA: Diagnosis not present

## 2016-06-30 DIAGNOSIS — Z85828 Personal history of other malignant neoplasm of skin: Secondary | ICD-10-CM | POA: Diagnosis not present

## 2016-06-30 DIAGNOSIS — L821 Other seborrheic keratosis: Secondary | ICD-10-CM | POA: Diagnosis not present

## 2016-06-30 DIAGNOSIS — L718 Other rosacea: Secondary | ICD-10-CM | POA: Diagnosis not present

## 2016-07-18 DIAGNOSIS — H5201 Hypermetropia, right eye: Secondary | ICD-10-CM | POA: Diagnosis not present

## 2016-07-18 DIAGNOSIS — H52221 Regular astigmatism, right eye: Secondary | ICD-10-CM | POA: Diagnosis not present

## 2016-07-18 DIAGNOSIS — H524 Presbyopia: Secondary | ICD-10-CM | POA: Diagnosis not present

## 2016-07-18 DIAGNOSIS — E119 Type 2 diabetes mellitus without complications: Secondary | ICD-10-CM | POA: Diagnosis not present

## 2016-07-18 DIAGNOSIS — Z7984 Long term (current) use of oral hypoglycemic drugs: Secondary | ICD-10-CM | POA: Diagnosis not present

## 2016-10-20 DIAGNOSIS — E119 Type 2 diabetes mellitus without complications: Secondary | ICD-10-CM | POA: Diagnosis not present

## 2016-10-20 DIAGNOSIS — Z7289 Other problems related to lifestyle: Secondary | ICD-10-CM | POA: Diagnosis not present

## 2016-10-20 DIAGNOSIS — I10 Essential (primary) hypertension: Secondary | ICD-10-CM | POA: Diagnosis not present

## 2016-10-20 DIAGNOSIS — E538 Deficiency of other specified B group vitamins: Secondary | ICD-10-CM | POA: Diagnosis not present

## 2016-10-20 DIAGNOSIS — D509 Iron deficiency anemia, unspecified: Secondary | ICD-10-CM | POA: Diagnosis not present

## 2016-10-20 DIAGNOSIS — Z125 Encounter for screening for malignant neoplasm of prostate: Secondary | ICD-10-CM | POA: Diagnosis not present

## 2016-10-20 DIAGNOSIS — Z1159 Encounter for screening for other viral diseases: Secondary | ICD-10-CM | POA: Diagnosis not present

## 2016-10-26 DIAGNOSIS — D509 Iron deficiency anemia, unspecified: Secondary | ICD-10-CM | POA: Diagnosis not present

## 2016-10-26 DIAGNOSIS — E7800 Pure hypercholesterolemia, unspecified: Secondary | ICD-10-CM | POA: Diagnosis not present

## 2016-10-26 DIAGNOSIS — E119 Type 2 diabetes mellitus without complications: Secondary | ICD-10-CM | POA: Diagnosis not present

## 2016-10-26 DIAGNOSIS — I10 Essential (primary) hypertension: Secondary | ICD-10-CM | POA: Diagnosis not present

## 2016-11-30 ENCOUNTER — Encounter: Payer: Self-pay | Admitting: Gastroenterology

## 2017-01-09 ENCOUNTER — Other Ambulatory Visit: Payer: Self-pay

## 2017-01-09 ENCOUNTER — Ambulatory Visit (AMBULATORY_SURGERY_CENTER): Payer: Self-pay

## 2017-01-09 VITALS — Ht 73.0 in | Wt 232.2 lb

## 2017-01-09 DIAGNOSIS — Z86010 Personal history of colonic polyps: Secondary | ICD-10-CM

## 2017-01-09 MED ORDER — NA SULFATE-K SULFATE-MG SULF 17.5-3.13-1.6 GM/177ML PO SOLN
1.0000 | Freq: Once | ORAL | 0 refills | Status: AC
Start: 2017-01-09 — End: 2017-01-09

## 2017-01-09 NOTE — Progress Notes (Signed)
Denies allergies to eggs or soy products. Denies complication of anesthesia or sedation. Denies use of weight loss medication. Denies use of O2.   Emmi instructions declined.  

## 2017-01-23 ENCOUNTER — Other Ambulatory Visit: Payer: Self-pay | Admitting: Cardiology

## 2017-01-25 ENCOUNTER — Telehealth: Payer: Self-pay | Admitting: Cardiology

## 2017-01-25 NOTE — Telephone Encounter (Signed)
New message       *STAT* If patient is at the pharmacy, call can be transferred to refill team.   1. Which medications need to be refilled? (please list name of each medication and dose if known)  Carvedilol 3.125mg  and lisinopril 40mg  2. Which pharmacy/location (including street and city if local pharmacy) is medication to be sent to?[facility] 3. Do they need a 30 day or 90 day supply? Enough to last until April appt

## 2017-01-26 ENCOUNTER — Other Ambulatory Visit: Payer: Self-pay | Admitting: Cardiology

## 2017-01-26 MED ORDER — LISINOPRIL 40 MG PO TABS: 40.0000 mg | ORAL_TABLET | Freq: Every day | ORAL | 0 refills | Status: DC

## 2017-01-26 MED ORDER — CARVEDILOL 3.125 MG PO TABS: 3.1250 mg | ORAL_TABLET | Freq: Two times a day (BID) | ORAL | 0 refills | Status: DC

## 2017-01-26 NOTE — Telephone Encounter (Signed)
Pt's medication was sent to pt's pharmacy as requested. Confirmation received.  °

## 2017-01-30 ENCOUNTER — Other Ambulatory Visit: Payer: Self-pay

## 2017-01-30 ENCOUNTER — Encounter: Payer: Self-pay | Admitting: Gastroenterology

## 2017-01-30 ENCOUNTER — Ambulatory Visit (AMBULATORY_SURGERY_CENTER): Payer: Medicare HMO | Admitting: Gastroenterology

## 2017-01-30 VITALS — BP 135/81 | HR 65 | Temp 97.5°F | Resp 14 | Ht 73.0 in | Wt 232.0 lb

## 2017-01-30 DIAGNOSIS — Z86010 Personal history of colonic polyps: Secondary | ICD-10-CM

## 2017-01-30 DIAGNOSIS — K219 Gastro-esophageal reflux disease without esophagitis: Secondary | ICD-10-CM | POA: Diagnosis not present

## 2017-01-30 DIAGNOSIS — K635 Polyp of colon: Secondary | ICD-10-CM

## 2017-01-30 DIAGNOSIS — E119 Type 2 diabetes mellitus without complications: Secondary | ICD-10-CM | POA: Diagnosis not present

## 2017-01-30 DIAGNOSIS — D123 Benign neoplasm of transverse colon: Secondary | ICD-10-CM

## 2017-01-30 DIAGNOSIS — I10 Essential (primary) hypertension: Secondary | ICD-10-CM | POA: Diagnosis not present

## 2017-01-30 MED ORDER — SODIUM CHLORIDE 0.9 % IV SOLN: 500.0000 mL | Freq: Once | INTRAVENOUS | Status: DC

## 2017-01-30 NOTE — Progress Notes (Signed)
Called to room to assist during endoscopic procedure.  Patient ID and intended procedure confirmed with present staff. Received instructions for my participation in the procedure from the performing physician.  

## 2017-01-30 NOTE — Op Note (Signed)
[facility] Patient Name: Roy [name] Procedure Date: 01/30/2017 10:25 AM MRN: [id number] Endoscopist: Ladene Artist , MD Age: 72 Referring MD:  Date of Birth: 20-Apr-1945 Gender: Male Account #: [id number] Procedure:                Colonoscopy Indications:              Surveillance: Personal history of adenomatous                            polyps on last colonoscopy 3 years ago Medicines:                Monitored Anesthesia Care Procedure:                Pre-Anesthesia Assessment:                           - Prior to the procedure, a History and Physical                            was performed, and patient medications and                            allergies were reviewed. The patient's tolerance of                            previous anesthesia was also reviewed. The risks                            and benefits of the procedure and the sedation                            options and risks were discussed with the patient.                            All questions were answered, and informed consent                            was obtained. Prior Anticoagulants: The patient has                            taken no previous anticoagulant or antiplatelet                            agents. ASA Grade Assessment: II - A patient with                            mild systemic disease. After reviewing the risks                            and benefits, the patient was deemed in                            satisfactory condition to undergo the procedure.  After obtaining informed consent, the colonoscope                            was passed under direct vision. Throughout the                            procedure, the patient's blood pressure, pulse, and                            oxygen saturations were monitored continuously. The                            Colonoscope was introduced through the anus and                            advanced to the the cecum,  identified by                            appendiceal orifice and ileocecal valve. The                            ileocecal valve, appendiceal orifice, and rectum                            were photographed. The quality of the bowel                            preparation was excellent. The colonoscopy was                            performed without difficulty. The patient tolerated                            the procedure well. Scope In: 10:36:16 AM Scope Out: 10:50:59 AM Scope Withdrawal Time: 0 hours 12 minutes 39 seconds  Total Procedure Duration: 0 hours 14 minutes 43 seconds  Findings:                 The perianal and digital rectal examinations were                            normal.                           A 8 mm polyp was found in the transverse colon. The                            polyp was sessile. The polyp was removed with a                            cold snare. Resection and retrieval were complete.                           Two sessile polyps were found in the transverse  colon. The polyps were 5 mm in size. These polyps                            were removed with a cold biopsy forceps. Resection                            and retrieval were complete.                           There was evidence of a prior end-to-end                            colo-colonic anastomosis in the sigmoid colon. This                            was patent and was characterized by healthy                            appearing mucosa. The anastomosis was traversed.                           Internal hemorrhoids were found during                            retroflexion. The hemorrhoids were small and Grade                            I (internal hemorrhoids that do not prolapse).                           The exam was otherwise without abnormality on                            direct and retroflexion views. Complications:            No immediate complications. Estimated  blood loss:                            None. Estimated Blood Loss:     Estimated blood loss: none. Impression:               - One 8 mm polyp in the transverse colon, removed                            with a cold snare. Resected and retrieved.                           - Two 5 mm polyps in the transverse colon, removed                            with a cold biopsy forceps. Resected and retrieved.                           - Patent end-to-end colo-colonic anastomosis,  characterized by healthy appearing mucosa.                           - Internal hemorrhoids.                           - The examination was otherwise normal on direct                            and retroflexion views. Recommendation:           - Repeat colonoscopy in 3 years for surveillance.                           - Patient has a contact number available for                            emergencies. The signs and symptoms of potential                            delayed complications were discussed with the                            patient. Return to normal activities tomorrow.                            Written discharge instructions were provided to the                            patient.                           - Resume previous diet.                           - Continue present medications.                           - Await pathology results. Ladene Artist, MD 01/30/2017 10:55:58 AM This report has been signed electronically.

## 2017-01-30 NOTE — Patient Instructions (Signed)
YOU HAD AN ENDOSCOPIC PROCEDURE TODAY AT [facility]:   Refer to the procedure report that was given to you for any specific questions about what was found during the examination.  If the procedure report does not answer your questions, please call your gastroenterologist to clarify.  If you requested that your care partner not be given the details of your procedure findings, then the procedure report has been included in a sealed envelope for you to review at your convenience later.  YOU SHOULD EXPECT: Some feelings of bloating in the abdomen. Passage of more gas than usual.  Walking can help get rid of the air that was put into your GI tract during the procedure and reduce the bloating. If you had a lower endoscopy (such as a colonoscopy or flexible sigmoidoscopy) you may notice spotting of blood in your stool or on the toilet paper. If you underwent a bowel prep for your procedure, you may not have a normal bowel movement for a few days.  Please Note:  You might notice some irritation and congestion in your nose or some drainage.  This is from the oxygen used during your procedure.  There is no need for concern and it should clear up in a day or so.  SYMPTOMS TO REPORT IMMEDIATELY:   Following lower endoscopy (colonoscopy or flexible sigmoidoscopy):  Excessive amounts of blood in the stool  Significant tenderness or worsening of abdominal pains  Swelling of the abdomen that is new, acute  Fever of 100F or higher   For urgent or emergent issues, a gastroenterologist can be reached at any hour by calling [phone].   DIET:  We do recommend a small meal at first, but then you may proceed to your regular diet.  Drink plenty of fluids but you should avoid alcoholic beverages for 24 hours.  ACTIVITY:  You should plan to take it easy for the rest of today and you should NOT DRIVE or use heavy machinery until tomorrow (because of the sedation medicines used during the test).     FOLLOW UP: Our staff will call the number listed on your records the next business day following your procedure to check on you and address any questions or concerns that you may have regarding the information given to you following your procedure. If we do not reach you, we will leave a message.  However, if you are feeling well and you are not experiencing any problems, there is no need to return our call.  We will assume that you have returned to your regular daily activities without incident.  If any biopsies were taken you will be contacted by phone or by letter within the next 1-3 weeks.  Please call us at [phone] if you have not heard about the biopsies in 3 weeks.    SIGNATURES/CONFIDENTIALITY: You and/or your care partner have signed paperwork which will be entered into your electronic medical record.  These signatures attest to the fact that that the information above on your After Visit Summary has been reviewed and is understood.  Full responsibility of the confidentiality of this discharge information lies with you and/or your care-partner.  Recall colonoscopy in 3 years per Dr. Fuller Plan.

## 2017-01-30 NOTE — Progress Notes (Signed)
Report to PACU, RN, vss, BBS= Clear.  

## 2017-01-31 ENCOUNTER — Telehealth: Payer: Self-pay

## 2017-01-31 ENCOUNTER — Telehealth: Payer: Self-pay | Admitting: *Deleted

## 2017-01-31 NOTE — Telephone Encounter (Signed)
Name identifier, left a message. 

## 2017-01-31 NOTE — Telephone Encounter (Signed)
  Follow up Call-  Call back number 01/30/2017  Post procedure Call Back phone  # [phone]  Permission to leave phone message Yes  Some recent data might be hidden     Patient questions:  Do you have a fever, pain , or abdominal swelling? No. Pain Score  0 *  Have you tolerated food without any problems? Yes.    Have you been able to return to your normal activities? Yes.    Do you have any questions about your discharge instructions: Diet   No. Medications  No. Follow up visit  No.  Do you have questions or concerns about your Care? No.  Actions: * If pain score is 4 or above: No action needed, pain <4.

## 2017-02-09 ENCOUNTER — Encounter: Payer: Self-pay | Admitting: Gastroenterology

## 2017-04-24 ENCOUNTER — Other Ambulatory Visit: Payer: Self-pay | Admitting: Cardiology

## 2017-04-24 DIAGNOSIS — D509 Iron deficiency anemia, unspecified: Secondary | ICD-10-CM | POA: Diagnosis not present

## 2017-04-24 DIAGNOSIS — E119 Type 2 diabetes mellitus without complications: Secondary | ICD-10-CM | POA: Diagnosis not present

## 2017-04-24 DIAGNOSIS — I10 Essential (primary) hypertension: Secondary | ICD-10-CM | POA: Diagnosis not present

## 2017-04-24 DIAGNOSIS — Z125 Encounter for screening for malignant neoplasm of prostate: Secondary | ICD-10-CM | POA: Diagnosis not present

## 2017-04-27 ENCOUNTER — Ambulatory Visit: Payer: Medicare HMO | Admitting: Cardiology

## 2017-04-27 ENCOUNTER — Encounter: Payer: Self-pay | Admitting: Cardiology

## 2017-04-27 VITALS — Ht 73.0 in

## 2017-04-27 DIAGNOSIS — R55 Syncope and collapse: Secondary | ICD-10-CM | POA: Diagnosis not present

## 2017-04-27 DIAGNOSIS — I428 Other cardiomyopathies: Secondary | ICD-10-CM

## 2017-04-27 DIAGNOSIS — I10 Essential (primary) hypertension: Secondary | ICD-10-CM

## 2017-04-27 DIAGNOSIS — I159 Secondary hypertension, unspecified: Secondary | ICD-10-CM

## 2017-04-27 DIAGNOSIS — E785 Hyperlipidemia, unspecified: Secondary | ICD-10-CM | POA: Diagnosis not present

## 2017-04-27 DIAGNOSIS — E782 Mixed hyperlipidemia: Secondary | ICD-10-CM

## 2017-04-27 MED ORDER — OMEGA-3-ACID ETHYL ESTERS 1 G PO CAPS: 1.0000 g | ORAL_CAPSULE | Freq: Every day | ORAL | 3 refills | Status: DC

## 2017-04-27 NOTE — Patient Instructions (Signed)
Medication Instructions:  Your physician recommends that you continue on your current medications as directed. Please refer to the Current Medication list given to you today.  If you need a refill on your cardiac medications, please contact your pharmacy first.  Labwork: None ordered   Testing/Procedures: None ordered   Follow-Up: Your physician wants you to follow-up in: 6 months with Dr. [name]. You will receive a reminder letter in the mail two months in advance. If you don't receive a letter, please call our office to schedule the follow-up appointment.  Any Other Special Instructions Will Be Listed Below (If Applicable).   Thank you for choosing [facility]    [phone]  If you need a refill on your cardiac medications before your next appointment, please call your pharmacy.

## 2017-04-27 NOTE — Progress Notes (Signed)
04/27/2017 Roy [name]   [birth year]  [id number]  Primary Physician Jani Gravel, MD Primary Cardiologist: Dr. [name]  Reason for Visit/CC: Post ED Visit for Syncope   HPI:  72 y.o. male with h/o DM2, HTN and HLD. He is followed by Dr. [name]. He also has been evaluated in the past for recurrent syncope. He has had multiple unexplained episodes in his lifetime. He went to the ER on two occasions and work up was negative. He had an echocardiogram September 2016  that showed mildly impaired LVEF 45-50% with regional wall motion abnormalities in the mid LAD distribution but has had no symptoms of chest pain. In October 2016 he had a negative nuclear stress test. He had a repeat 2D echo recently on 08/03/15 that showed improvement in LVEF to low normal at 50-55%.    He presented to the ED in 09/2015, with another episode of syncope. He was in his usual state of health. He was standing in church when he suddenly felt unwell. Told his wife he felt like the was going to pass out. She assisted him to a chair. She reports that he briefly loss consciousness for several seconds. He denies felling hot at the time. No associated CP, palpations nor dyspnea. He was sent to the ED for evaluation .CT scan of the head was unremarkable. He was hypotensive with initial SBP in the 80s that improved with IVFs. He did not require admission. He was discharged home from the ED. He called the office and was instructed to come in for evaluation and placement of a cardiac event monitor. He denies any further recurrence since that time. He does note that he is enrolled in a research study and is taking a medication. He is a blind participant and does not known the name of the drug. He started taking it roughly 3 months prior to his syncopal spell. He has had no other side effects.   02/15/2016 - 2 months follow-up, in the interim the patient had no more syncopal episodes. He underwent 30 day event monitoring that showed no arrhythmias  or pauses. He has been very active and denies any chest pain or shortness of breath. No side effects from pravastatin. He had blood for done in September that showed normal electrolytes, kidney, normal liver function and normal CBC. We don't have lipids will request from his primary care physician. He has been compliant to his medicines.  04/26/2017 - 1 year follow up, feels well, he remains active, denies any chest pain or syncope. No lower extremity edema. He just overcame a cold and continues to have dry nonproductive cough no fevers. Has been compliant with his medications and is asking about alternative to Lovaza as it is costly.  Current Meds  Medication Sig  . amLODipine (NORVASC) 10 MG tablet Take 10 mg by mouth daily.  [name] aspirin 81 MG tablet as directed. Take 1 tablet by mouth 5 times weekly.  . carvedilol (COREG) 3.125 MG tablet Take 1 tablet (3.125 mg total) by mouth 2 (two) times daily. Please keep upcoming appt in April with Dr. [name]. Thank you  . cholecalciferol (VITAMIN D) 1000 UNITS tablet Take 1,000 Units by mouth every evening. Vit D3  2000 IU-Take a pill daily  . glucosamine-chondroitin 500-400 MG tablet Take 1 tablet by mouth. 5 times weekly  . lisinopril (PRINIVIL,ZESTRIL) 40 MG tablet Take 1 tablet (40 mg total) by mouth daily.  . metFORMIN (GLUCOPHAGE) 1000 MG tablet Take 1,000 mg by mouth 2 (  two) times daily with a meal.   . Multiple Vitamin (MULTIVITAMIN WITH MINERALS) TABS tablet Take 1 tablet by mouth. 5 times weekly  . omeprazole (PRILOSEC) 20 MG capsule Take 20 mg by mouth daily.  . pioglitazone (ACTOS) 30 MG tablet Take 30 mg by mouth daily.   . pravastatin (PRAVACHOL) 40 MG tablet Take 40 mg by mouth at bedtime.   . vitamin B-12 (CYANOCOBALAMIN) 1000 MCG tablet Take 1,000 mcg by mouth. 5 times a week  . [DISCONTINUED] omega-3 acid ethyl esters (LOVAZA) 1 g capsule Take 1 capsule (1 g total) by mouth daily.   No Known Allergies Past Medical History:  Diagnosis Date   . Allergic rhinitis   . Allergy   . Arthritis   . Diabetes mellitus   . GERD (gastroesophageal reflux disease)   . Hyperlipidemia   . Hypertension   . Hypovitaminosis D   . Nephrolithiasis   . Plantar fasciitis   . Syncope    Family History  Problem Relation Age of Onset  . Heart attack Mother   . Stroke Father   . Colon cancer Neg Hx   . Esophageal cancer Neg Hx   . Pancreatic cancer Neg Hx   . Rectal cancer Neg Hx   . Stomach cancer Neg Hx    Past Surgical History:  Procedure Laterality Date  . Abdominal Growth    . COLONOSCOPY  07/20/2007  . SHOULDER SURGERY     Bil/scar tissue in joints  . 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 TONSILLECTOMY AND ADENOIDECTOMY     Social History   Socioeconomic History  . Marital status: Married    Spouse name: Not on file  . Number of children: Not on file  . Years of education: Not on file  . Highest education level: Not on file  Occupational History  . Not on file  Social Needs  . Financial resource strain: Not on file  . Food insecurity:    Worry: Not on file    Inability: Not on file  . Transportation needs:    Medical: Not on file    Non-medical: Not on file  Tobacco Use  . Smoking status: Former Smoker    Types: Pipe    Last attempt to quit: 11/29/1976    Years since quitting: 40.4  . Smokeless tobacco: Never Used  Substance and Sexual Activity  . Alcohol use: Yes    Alcohol/week: 0.0 oz    Comment: Occasional drink   . Drug use: No  . Sexual activity: Not on file  Lifestyle  . Physical activity:    Days per week: Not on file    Minutes per session: Not on file  . Stress: Not on file  Relationships  . Social connections:    Talks on phone: Not on file    Gets together: Not on file    Attends religious service: Not on file    Active member of club or organization: Not on file    Attends meetings of clubs or organizations: Not on file    Relationship status: Not on file    . Intimate partner violence:    Fear of current or ex partner: Not on file    Emotionally abused: Not on file    Physically abused: Not on file    Forced sexual activity: Not on file  Other Topics Concern  . Not on file  Social History Narrative  . Not on file  Review of Systems: General: negative for chills, fever, night sweats or weight changes.  Cardiovascular: negative for chest pain, dyspnea on exertion, edema, orthopnea, palpitations, paroxysmal nocturnal dyspnea or shortness of breath Dermatological: negative for rash Respiratory: negative for cough or wheezing Urologic: negative for hematuria Abdominal: negative for nausea, vomiting, diarrhea, bright red blood per rectum, melena, or hematemesis Neurologic: negative for visual changes, syncope, or dizziness All other systems reviewed and are otherwise negative except as noted above.  Physical Exam:  Blood pressure 132/74, heart rate 97. Weight 234 pounds. Height 6\' 1"  (1.854 m).  General appearance: alert, cooperative and no distress Neck: no carotid bruit and no JVD Lungs: clear to auscultation bilaterally Heart: regular rate and rhythm, S1, S2 normal, no murmur, click, rub or gallop Extremities: extremities normal, atraumatic, no cyanosis or edema Pulses: 2+ and symmetric Skin: Skin color, texture, turgor normal. No rashes or lesions Neurologic: Grossly normal  EKG performed today 04/26/2017 shows normal sinus rhythm, left axis deviation, LVH, nonspecific ST-T wave abnormalities unchanged from prior. This was personally reviewed   ASSESSMENT AND PLAN:   1. Syncope: W/u in the ED revealed normal head CT and mild hypotension that improved with IVFs. He denies any recurrent symptoms but has a h/o recurrent syncope. He notes 7 unexplained episodes in his lifetime. NST 10/2014 was negative for ischemia. 2D echo 07/2015 showed normal LVEF at 50-55%. A 30 day monitor showed sinus bradycardia to sinus rhythm with no  arrhythmias and no pauses. Carotid ultrasound in September 2016 showed only minimal plaque in the right carotid artery.  He has had no recurrent episodes since 2016.  2. 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 He is asking about continuation of aspirin, he has no problems with GI bleed, considering high triglycerides level I would like to continue to his 78.  3. Hypertension - controlled.  4. HLP - his most recent lipids from this Monday, 04/24/2017 HDL 38, LDL 97, triglycerides 326, he's currently on pravastatin and Lovaza, he is encouraged to continue. We will prescribe generic form of fish oil to reduce the cost.  Follow-up in 6 months.   Ena Dawley 04/27/2017

## 2017-05-01 DIAGNOSIS — D519 Vitamin B12 deficiency anemia, unspecified: Secondary | ICD-10-CM | POA: Diagnosis not present

## 2017-05-01 DIAGNOSIS — D509 Iron deficiency anemia, unspecified: Secondary | ICD-10-CM | POA: Diagnosis not present

## 2017-05-01 DIAGNOSIS — I10 Essential (primary) hypertension: Secondary | ICD-10-CM | POA: Diagnosis not present

## 2017-05-01 DIAGNOSIS — E119 Type 2 diabetes mellitus without complications: Secondary | ICD-10-CM | POA: Diagnosis not present

## 2017-05-01 DIAGNOSIS — E7800 Pure hypercholesterolemia, unspecified: Secondary | ICD-10-CM | POA: Diagnosis not present

## 2017-05-01 DIAGNOSIS — Z0000 Encounter for general adult medical examination without abnormal findings: Secondary | ICD-10-CM | POA: Diagnosis not present

## 2017-05-02 ENCOUNTER — Telehealth: Payer: Self-pay

## 2017-05-02 NOTE — Telephone Encounter (Signed)
-----   Message from Ladene Artist, MD sent at 05/02/2017 12:21 PM EDT ----- Pt had recent colonoscopy for polyp follow up. I received blood work today from Dr. Julianne Rice office that show iron deficiency in 2018 so he needs EGD to complete the evaluation. Celiac antibodies were negative in 2018.

## 2017-05-02 NOTE — Telephone Encounter (Signed)
Left message for patient to call back  

## 2017-05-03 NOTE — Telephone Encounter (Signed)
Left message for patient to call back  

## 2017-05-05 NOTE — Telephone Encounter (Signed)
Left message for patient to call back  

## 2017-05-08 NOTE — Telephone Encounter (Signed)
Dr. Fuller Plan I left multiple messages on an identified voicemail with no return calls. Left another this am.

## 2017-05-08 NOTE — Telephone Encounter (Signed)
Patient returned call and is scheduled for EGD and pre-visit

## 2017-05-08 NOTE — Telephone Encounter (Signed)
Pt returned your call and would like a call back .. °

## 2017-05-13 ENCOUNTER — Other Ambulatory Visit: Payer: Self-pay | Admitting: Cardiology

## 2017-05-18 ENCOUNTER — Other Ambulatory Visit: Payer: Self-pay | Admitting: Cardiology

## 2017-05-24 ENCOUNTER — Ambulatory Visit (AMBULATORY_SURGERY_CENTER): Payer: Self-pay | Admitting: *Deleted

## 2017-05-24 ENCOUNTER — Other Ambulatory Visit: Payer: Self-pay

## 2017-05-24 VITALS — Ht 73.0 in | Wt 229.0 lb

## 2017-05-24 DIAGNOSIS — D508 Other iron deficiency anemias: Secondary | ICD-10-CM

## 2017-05-24 NOTE — Progress Notes (Signed)
No egg or soy allergy known to patient  No issues with past sedation with any surgeries  or procedures, no intubation problems  No diet pills per patient No home 02 use per patient  No blood thinners per patient  Pt denies issues with constipation  No A fib or A flutter  EMMI video sent to pt's e mail -pt declined video

## 2017-05-26 ENCOUNTER — Encounter: Payer: Self-pay | Admitting: Gastroenterology

## 2017-06-06 ENCOUNTER — Ambulatory Visit (AMBULATORY_SURGERY_CENTER): Payer: Medicare HMO | Admitting: Gastroenterology

## 2017-06-06 ENCOUNTER — Encounter: Payer: Self-pay | Admitting: Gastroenterology

## 2017-06-06 ENCOUNTER — Other Ambulatory Visit: Payer: Self-pay

## 2017-06-06 VITALS — BP 120/80 | HR 75 | Temp 98.4°F | Resp 10 | Ht 73.0 in | Wt 229.0 lb

## 2017-06-06 DIAGNOSIS — D508 Other iron deficiency anemias: Secondary | ICD-10-CM | POA: Diagnosis present

## 2017-06-06 DIAGNOSIS — K2270 Barrett's esophagus without dysplasia: Secondary | ICD-10-CM | POA: Diagnosis not present

## 2017-06-06 DIAGNOSIS — K449 Diaphragmatic hernia without obstruction or gangrene: Secondary | ICD-10-CM | POA: Diagnosis not present

## 2017-06-06 DIAGNOSIS — K317 Polyp of stomach and duodenum: Secondary | ICD-10-CM

## 2017-06-06 DIAGNOSIS — D509 Iron deficiency anemia, unspecified: Secondary | ICD-10-CM | POA: Diagnosis not present

## 2017-06-06 MED ORDER — SODIUM CHLORIDE 0.9 % IV SOLN: 500.0000 mL | Freq: Once | INTRAVENOUS | Status: DC

## 2017-06-06 NOTE — Op Note (Signed)
[facility] Patient Name: Kathy Wares Procedure Date: 06/06/2017 3:29 PM MRN: [id number] Endoscopist: Ladene Artist , MD Age: 72 Referring MD:  Date of Birth: 05-24-1945 Gender: Male Account #: [id number] Procedure:                Upper GI endoscopy Indications:              Iron deficiency anemia Medicines:                Monitored Anesthesia Care Procedure:                Pre-Anesthesia Assessment:                           - Prior to the procedure, a History and Physical                            was performed, and patient medications and                            allergies were reviewed. The patient's tolerance of                            previous anesthesia was also reviewed. The risks                            and benefits of the procedure and the sedation                            options and risks were discussed with the patient.                            All questions were answered, and informed consent                            was obtained. Prior Anticoagulants: The patient has                            taken no previous anticoagulant or antiplatelet                            agents. ASA Grade Assessment: II - A patient with                            mild systemic disease. After reviewing the risks                            and benefits, the patient was deemed in                            satisfactory condition to undergo the procedure.                           After obtaining informed consent, the endoscope was  passed under direct vision. Throughout the                            procedure, the patient's blood pressure, pulse, and                            oxygen saturations were monitored continuously. The                            Model GIF-[id number]) scope was introduced                            through the mouth, and advanced to the second part                            of duodenum. The upper GI endoscopy  was                            accomplished without difficulty. The patient                            tolerated the procedure well. Scope In: Scope Out: Findings:                 The Z-line was variable and was found 33-35 cm from                            the incisors. R/O Barrett's. Biopsies were taken                            with a cold forceps for histology.                           The exam of the esophagus was otherwise normal.                           A large hiatal hernia was present.                           Multiple (25 +) 5 to 12 mm pedunculated,                            semipedunculated and sessile polyps with no                            bleeding and no stigmata of recent bleeding were                            found in the gastric fundus and in the gastric                            body. Multiple (12-15) of the larger polyps were  removed with a cold snare. Resection and retrieval                            were complete.                           The exam of the stomach was otherwise normal.                           The duodenal bulb and second portion of the                            duodenum were normal. Biopsies for histology were                            taken with a cold forceps for evaluation of celiac                            disease. Complications:            No immediate complications. Estimated Blood Loss:     Estimated blood loss was minimal. Impression:               - Z-line variable, 35 cm from the incisors.                            Biopsied.                           - Large-sized hiatal hernia.                           - Multiple gastric polyps. Resected and retrieved.                           - Normal duodenal bulb and second portion of the                            duodenum. Biopsied. Recommendation:           - Patient has a contact number available for                            emergencies. The signs  and symptoms of potential                            delayed complications were discussed with the                            patient. Return to normal activities tomorrow.                            Written discharge instructions were provided to the                            patient.                           -  Resume previous diet.                           - Antireflux measures.                           - Continue present medications.                           - Await pathology results. Ladene Artist, MD 06/06/2017 3:53:05 PM This report has been signed electronically.

## 2017-06-06 NOTE — Patient Instructions (Signed)
Thank you for allowing [place] to care for you today!  Await pathology results by mail, approx. 2 weeks.  Anti-reflux measures   Handout given for GERD  Continue current medications.      YOU HAD AN ENDOSCOPIC PROCEDURE TODAY AT [facility]:   Refer to the procedure report that was given to you for any specific questions about what was found during the examination.  If the procedure report does not answer your questions, please call your gastroenterologist to clarify.  If you requested that your care partner not be given the details of your procedure findings, then the procedure report has been included in a sealed envelope for you to review at your convenience later.  YOU SHOULD EXPECT: Some feelings of bloating in the abdomen. Passage of more gas than usual.  Walking can help get rid of the air that was put into your GI tract during the procedure and reduce the bloating. If you had a lower endoscopy (such as a colonoscopy or flexible sigmoidoscopy) you may notice spotting of blood in your stool or on the toilet paper. If you underwent a bowel prep for your procedure, you may not have a normal bowel movement for a few days.  Please Note:  You might notice some irritation and congestion in your nose or some drainage.  This is from the oxygen used during your procedure.  There is no need for concern and it should clear up in a day or so.  SYMPTOMS TO REPORT IMMEDIATELY:    Following upper endoscopy (EGD)  Vomiting of blood or coffee ground material  New chest pain or pain under the shoulder blades  Painful or persistently difficult swallowing  New shortness of breath  Fever of 100F or higher  Black, tarry-looking stools  For urgent or emergent issues, a gastroenterologist can be reached at any hour by calling [phone].   DIET:  We do recommend a small meal at first, but then you may proceed to your regular diet.  Drink plenty of fluids but you should avoid  alcoholic beverages for 24 hours.  ACTIVITY:  You should plan to take it easy for the rest of today and you should NOT DRIVE or use heavy machinery until tomorrow (because of the sedation medicines used during the test).    FOLLOW UP: Our staff will call the number listed on your records the next business day following your procedure to check on you and address any questions or concerns that you may have regarding the information given to you following your procedure. If we do not reach you, we will leave a message.  However, if you are feeling well and you are not experiencing any problems, there is no need to return our call.  We will assume that you have returned to your regular daily activities without incident.  If any biopsies were taken you will be contacted by phone or by letter within the next 1-3 weeks.  Please call us at [phone] if you have not heard about the biopsies in 3 weeks.    SIGNATURES/CONFIDENTIALITY: You and/or your care partner have signed paperwork which will be entered into your electronic medical record.  These signatures attest to the fact that that the information above on your After Visit Summary has been reviewed and is understood.  Full responsibility of the confidentiality of this discharge information lies with you and/or your care-partner.

## 2017-06-06 NOTE — Progress Notes (Signed)
Called to room to assist during endoscopic procedure.  Patient ID and intended procedure confirmed with present staff. Received instructions for my participation in the procedure from the performing physician.  

## 2017-06-06 NOTE — Progress Notes (Signed)
Spontaneous respirations throughout. VSS. Resting comfortably. To PACU on room air. Report to  RN. 

## 2017-06-07 ENCOUNTER — Telehealth: Payer: Self-pay

## 2017-06-07 NOTE — Telephone Encounter (Signed)
  Follow up Call-  Call back number 06/06/2017 01/30/2017  Post procedure Call Back phone  # [phone]  Permission to leave phone message Yes Yes  Some recent data might be hidden     Patient questions:  Do you have a fever, pain , or abdominal swelling? No. Pain Score  0 *  Have you tolerated food without any problems? Yes.    Have you been able to return to your normal activities? Yes.    Do you have any questions about your discharge instructions: Diet   No. Medications  No. Follow up visit  No.  Do you have questions or concerns about your Care? No.  Actions: * If pain score is 4 or above: No action needed, pain <4.

## 2017-06-18 ENCOUNTER — Encounter: Payer: Self-pay | Admitting: Gastroenterology

## 2017-10-24 DIAGNOSIS — I10 Essential (primary) hypertension: Secondary | ICD-10-CM | POA: Diagnosis not present

## 2017-10-24 DIAGNOSIS — D519 Vitamin B12 deficiency anemia, unspecified: Secondary | ICD-10-CM | POA: Diagnosis not present

## 2017-10-24 DIAGNOSIS — E7800 Pure hypercholesterolemia, unspecified: Secondary | ICD-10-CM | POA: Diagnosis not present

## 2017-10-24 DIAGNOSIS — D509 Iron deficiency anemia, unspecified: Secondary | ICD-10-CM | POA: Diagnosis not present

## 2017-10-24 DIAGNOSIS — E119 Type 2 diabetes mellitus without complications: Secondary | ICD-10-CM | POA: Diagnosis not present

## 2017-10-31 DIAGNOSIS — D509 Iron deficiency anemia, unspecified: Secondary | ICD-10-CM | POA: Diagnosis not present

## 2017-10-31 DIAGNOSIS — Z23 Encounter for immunization: Secondary | ICD-10-CM | POA: Diagnosis not present

## 2017-10-31 DIAGNOSIS — E119 Type 2 diabetes mellitus without complications: Secondary | ICD-10-CM | POA: Diagnosis not present

## 2017-10-31 DIAGNOSIS — L989 Disorder of the skin and subcutaneous tissue, unspecified: Secondary | ICD-10-CM | POA: Diagnosis not present

## 2017-10-31 DIAGNOSIS — D519 Vitamin B12 deficiency anemia, unspecified: Secondary | ICD-10-CM | POA: Diagnosis not present

## 2017-10-31 DIAGNOSIS — E782 Mixed hyperlipidemia: Secondary | ICD-10-CM | POA: Diagnosis not present

## 2017-10-31 DIAGNOSIS — I10 Essential (primary) hypertension: Secondary | ICD-10-CM | POA: Diagnosis not present

## 2017-11-23 ENCOUNTER — Ambulatory Visit: Payer: Medicare HMO | Admitting: Cardiology

## 2017-11-23 VITALS — BP 138/88 | HR 86 | Ht 73.0 in | Wt 233.0 lb

## 2017-11-23 DIAGNOSIS — I10 Essential (primary) hypertension: Secondary | ICD-10-CM | POA: Diagnosis not present

## 2017-11-23 DIAGNOSIS — R55 Syncope and collapse: Secondary | ICD-10-CM

## 2017-11-23 DIAGNOSIS — E782 Mixed hyperlipidemia: Secondary | ICD-10-CM

## 2017-11-23 MED ORDER — AMLODIPINE BESYLATE 5 MG PO TABS: 5.0000 mg | ORAL_TABLET | Freq: Every day | ORAL | 3 refills | Status: DC

## 2017-11-23 NOTE — Patient Instructions (Signed)
Medication Instructions:  1) DISCONTINUE Lovaza 2) DECREASE Amlodipine to 5mg  once daily  If you need a refill on your cardiac medications before your next appointment, please call your pharmacy.   Lab work: None If you have labs (blood work) drawn today and your tests are completely normal, you will receive your results only by: [name] MyChart Message (if you have MyChart) OR . A paper copy in the mail If you have any lab test that is abnormal or we need to change your treatment, we will call you to review the results.  Testing/Procedures: None  Follow-Up: At [facility], you and your health needs are our priority.  As part of our continuing mission to provide you with exceptional heart care, we have created designated Provider Care Teams.  These Care Teams include your primary Cardiologist (physician) and Advanced Practice Providers (APPs -  Physician Assistants and Nurse Practitioners) who all work together to provide you with the care you need, when you need it. You will need a follow up appointment in 6 months.  Please call our office 2 months in advance to schedule this appointment.  You may see Ena Dawley, MD or one of the following Advanced Practice Providers on your designated Care Team:   [place], PA-C Melina Copa, PA-C . Ermalinda Barrios, PA-C  Any Other Special Instructions Will Be Listed Below (If Applicable).  \

## 2017-11-23 NOTE — Progress Notes (Signed)
11/23/2017 [name]   Nov 04, 1945  [id number]  Primary Physician Jani Gravel, MD Primary Cardiologist: Dr. [name]  Reason for Visit/CC: Post ED Visit for Syncope   HPI:  72 y.o. male with h/o DM2, HTN and HLD. He is followed by Dr. [name]. He also has been evaluated in the past for recurrent syncope. He has had multiple unexplained episodes in his lifetime. He went to the ER on two occasions and work up was negative. He had an echocardiogram September 2016  that showed mildly impaired LVEF 45-50% with regional wall motion abnormalities in the mid LAD distribution but has had no symptoms of chest pain. In October 2016 he had a negative nuclear stress test. He had a repeat 2D echo recently on 08/03/15 that showed improvement in LVEF to low normal at 50-55%.    He presented to the ED in 09/2015, with another episode of syncope. He was in his usual state of health. He was standing in church when he suddenly felt unwell. Told his wife he felt like the was going to pass out. She assisted him to a chair. She reports that he briefly loss consciousness for several seconds. He denies felling hot at the time. No associated CP, palpations nor dyspnea. He was sent to the ED for evaluation .CT scan of the head was unremarkable. He was hypotensive with initial SBP in the 80s that improved with IVFs. He did not require admission. He was discharged home from the ED. He called the office and was instructed to come in for evaluation and placement of a cardiac event monitor. He denies any further recurrence since that time. He does note that he is enrolled in a research study and is taking a medication. He is a blind participant and does not known the name of the drug. He started taking it roughly 3 months prior to his syncopal spell. He has had no other side effects.   02/15/2016 - 2 months follow-up, in the interim the patient had no more syncopal episodes. He underwent 30 day event monitoring that showed no  arrhythmias or pauses. He has been very active and denies any chest pain or shortness of breath. No side effects from pravastatin. He had blood for done in September that showed normal electrolytes, kidney, normal liver function and normal CBC. We don't have lipids will request from his primary care physician. He has been compliant to his medicines.  04/26/2017 - 1 year follow up, feels well, he remains active, denies any chest pain or syncope. No lower extremity edema. He just overcame a cold and continues to have dry nonproductive cough no fevers. Has been compliant with his medications and is asking about alternative to Lovaza as it is costly.  11/23/2017 -the patient is coming after 6 months, he had another episode of syncope after sitting for about 15 to 20 minutes in church, he was able to sit down as he felt warm rushing to his head, EMS was called EKG was normal, blood pressure was not recorded, he recently had his blood work checked it was all normal except for triglycerides were 293.  He is in a research study for cholesterol management with [facility] with April McCaskill. He states that he does not drink water during the day.  He was recently diagnosed with Barrett's esophagus.   Current Meds  Medication Sig  . amLODipine (NORVASC) 10 MG tablet Take 10 mg by mouth daily.  [name] aspirin 81 MG tablet as directed. Take 1  tablet by mouth 5 times weekly.  . carvedilol (COREG) 3.125 MG tablet Take 1 tablet (3.125 mg total) by mouth 2 (two) times daily with a meal.  . cholecalciferol (VITAMIN D) 1000 UNITS tablet Take 1,000 Units by mouth every evening. Vit D3  2000 IU-Take a pill daily  . glucosamine-chondroitin 500-400 MG tablet Take 1 tablet by mouth. 5 times weekly  . lisinopril (PRINIVIL,ZESTRIL) 40 MG tablet TAKE 1 TABLET BY MOUTH DAILY.  . metFORMIN (GLUCOPHAGE) 1000 MG tablet Take 1,000 mg by mouth 2 (two) times daily with a meal.   . Multiple Vitamin (MULTIVITAMIN WITH MINERALS) TABS  tablet Take 1 tablet by mouth. 5 times weekly  . omega-3 acid ethyl esters (LOVAZA) 1 g capsule Take 1 capsule (1 g total) by mouth daily.  [name] omeprazole (PRILOSEC) 20 MG capsule Take 20 mg by mouth daily.  . pioglitazone (ACTOS) 30 MG tablet Take 30 mg by mouth daily.   . pravastatin (PRAVACHOL) 40 MG tablet Take 40 mg by mouth at bedtime.   . vitamin B-12 (CYANOCOBALAMIN) 1000 MCG tablet Take 1,000 mcg by mouth. 5 times a week   Current Facility-Administered Medications for the 11/23/17 encounter (Office Visit) with Dorothy Spark, MD  Medication  . 0.9 %  sodium chloride infusion   No Known Allergies Past Medical History:  Diagnosis Date  . Allergic rhinitis   . Allergy   . Arthritis   . Diabetes mellitus    pt states has never been told is Diabetic despite on 2 medicines   . GERD (gastroesophageal reflux disease)   . Hyperlipidemia   . Hypertension   . Hypovitaminosis D   . Nephrolithiasis   . Plantar fasciitis   . Syncope    Family History  Problem Relation Age of Onset  . Heart attack Mother   . Stroke Father   . Colon cancer Neg Hx   . Esophageal cancer Neg Hx   . Pancreatic cancer Neg Hx   . Rectal cancer Neg Hx   . Stomach cancer Neg Hx   . Colon polyps Neg Hx    Past Surgical History:  Procedure Laterality Date  . Abdominal Growth     non malignant growth   . COLONOSCOPY     06-2007, last colon 01-30-17 3 polyps and int hems   . POLYPECTOMY    . SHOULDER SURGERY     Bil/scar tissue in joints  . 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 TONSILLECTOMY AND ADENOIDECTOMY    . UPPER GASTROINTESTINAL ENDOSCOPY     x2   Social History   Socioeconomic History  . Marital status: Married    Spouse name: Not on file  . Number of children: Not on file  . Years of education: Not on file  . Highest education level: Not on file  Occupational History  . Not on file  Social Needs  . Financial resource strain: Not  on file  . Food insecurity:    Worry: Not on file    Inability: Not on file  . Transportation needs:    Medical: Not on file    Non-medical: Not on file  Tobacco Use  . Smoking status: Former Smoker    Types: Pipe    Last attempt to quit: 11/29/1976    Years since quitting: 41.0  . Smokeless tobacco: Never Used  Substance and Sexual Activity  . Alcohol use: Yes  Alcohol/week: 0.0 standard drinks    Comment: Occasional drink   . Drug use: No  . Sexual activity: Not on file  Lifestyle  . Physical activity:    Days per week: Not on file    Minutes per session: Not on file  . Stress: Not on file  Relationships  . Social connections:    Talks on phone: Not on file    Gets together: Not on file    Attends religious service: Not on file    Active member of club or organization: Not on file    Attends meetings of clubs or organizations: Not on file    Relationship status: Not on file  . Intimate partner violence:    Fear of current or ex partner: Not on file    Emotionally abused: Not on file    Physically abused: Not on file    Forced sexual activity: Not on file  Other Topics Concern  . Not on file  Social History Narrative  . Not on file     Review of Systems: General: negative for chills, fever, night sweats or weight changes.  Cardiovascular: negative for chest pain, dyspnea on exertion, edema, orthopnea, palpitations, paroxysmal nocturnal dyspnea or shortness of breath Dermatological: negative for rash Respiratory: negative for cough or wheezing Urologic: negative for hematuria Abdominal: negative for nausea, vomiting, diarrhea, bright red blood per rectum, melena, or hematemesis Neurologic: negative for visual changes, syncope, or dizziness All other systems reviewed and are otherwise negative except as noted above.  Physical Exam:  Blood pressure 132/74, heart rate 97. Weight 234 pounds. Blood pressure 138/88, pulse 86, height 6\' 1"  (1.854 m), weight 233 lb  (105.7 kg).  General appearance: alert, cooperative and no distress Neck: no carotid bruit and no JVD Lungs: clear to auscultation bilaterally Heart: regular rate and rhythm, S1, S2 normal, no murmur, click, rub or gallop Extremities: extremities normal, atraumatic, no cyanosis or edema Pulses: 2+ and symmetric Skin: Skin color, texture, turgor normal. No rashes or lesions Neurologic: Grossly normal  EKG performed today 04/26/2017 shows normal sinus rhythm, left axis deviation, LVH, nonspecific ST-T wave abnormalities unchanged from prior. This was personally reviewed   ASSESSMENT AND PLAN:   1.  Recurrent syncope: W/u in the ED revealed normal head CT and mild hypotension that improved with IVFs. He denies any recurrent symptoms but has a h/o recurrent syncope. He notes 7 unexplained episodes in his lifetime. NST 10/2014 was negative for ischemia. 2D echo 07/2015 showed normal LVEF at 50-55%. A 30 day monitor showed sinus bradycardia to sinus rhythm with no arrhythmias and no pauses. Carotid ultrasound in September 2016 showed only minimal plaque in the right carotid artery.  It appears that it is orthostatic, he does not drink any water during the day, he is advised to drink a glass of water when he gets up in the morning and another 3 glasses minimum during the day, he is also advised to perform ankle pumps or walk in a church instead of sitting for prolonged time.  I will decrease amlodipine to 5 mg daily.  2. 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 He is asking about continuation of aspirin, he has no problems with GI bleed, considering high triglycerides level I would like to continue to his 78.  3. Hypertension -I will decrease amlodipine to 5 mg daily as he is experiencing orthostatic hypotension.  4. HLP - his most recent lipids show normal LDL but HDL of 293, I  prescribed Lovaza however his research coordinator Lazaro Arms at [facility] does not want him to take that as he is in a research trial for cholesterol management.  We will discontinue.  Follow-up in 6 months.   Ena Dawley 11/23/2017

## 2018-04-30 DIAGNOSIS — Z0000 Encounter for general adult medical examination without abnormal findings: Secondary | ICD-10-CM | POA: Diagnosis not present

## 2018-04-30 DIAGNOSIS — I10 Essential (primary) hypertension: Secondary | ICD-10-CM | POA: Diagnosis not present

## 2018-04-30 DIAGNOSIS — D509 Iron deficiency anemia, unspecified: Secondary | ICD-10-CM | POA: Diagnosis not present

## 2018-04-30 DIAGNOSIS — E119 Type 2 diabetes mellitus without complications: Secondary | ICD-10-CM | POA: Diagnosis not present

## 2018-04-30 DIAGNOSIS — Z125 Encounter for screening for malignant neoplasm of prostate: Secondary | ICD-10-CM | POA: Diagnosis not present

## 2018-04-30 DIAGNOSIS — E782 Mixed hyperlipidemia: Secondary | ICD-10-CM | POA: Diagnosis not present

## 2018-04-30 DIAGNOSIS — D519 Vitamin B12 deficiency anemia, unspecified: Secondary | ICD-10-CM | POA: Diagnosis not present

## 2018-05-07 DIAGNOSIS — I10 Essential (primary) hypertension: Secondary | ICD-10-CM | POA: Diagnosis not present

## 2018-05-07 DIAGNOSIS — Z0000 Encounter for general adult medical examination without abnormal findings: Secondary | ICD-10-CM | POA: Diagnosis not present

## 2018-05-07 DIAGNOSIS — D509 Iron deficiency anemia, unspecified: Secondary | ICD-10-CM | POA: Diagnosis not present

## 2018-05-07 DIAGNOSIS — Z125 Encounter for screening for malignant neoplasm of prostate: Secondary | ICD-10-CM | POA: Diagnosis not present

## 2018-05-07 DIAGNOSIS — I5189 Other ill-defined heart diseases: Secondary | ICD-10-CM | POA: Diagnosis not present

## 2018-05-07 DIAGNOSIS — E782 Mixed hyperlipidemia: Secondary | ICD-10-CM | POA: Diagnosis not present

## 2018-05-07 DIAGNOSIS — E119 Type 2 diabetes mellitus without complications: Secondary | ICD-10-CM | POA: Diagnosis not present

## 2018-05-11 ENCOUNTER — Other Ambulatory Visit: Payer: Self-pay | Admitting: Cardiology

## 2018-06-13 ENCOUNTER — Telehealth: Payer: Self-pay | Admitting: *Deleted

## 2018-06-13 NOTE — Telephone Encounter (Signed)
Virtual Visit Pre-Appointment Phone Call  "(Name), I am calling you today to discuss your upcoming appointment. We are currently trying to limit exposure to the virus that causes COVID-19 by seeing patients at home rather than in the office."  1. "What is the BEST phone number to call the day of the visit?" - include this in appointment notes-YES UPDATED IN NOTES  2. "Do you have or have access to (through a family member/friend) a smartphone with video capability that we can use for your visit?"  a. If no - list the appointment type as a PHONE visit in appointment notes-YES UPDATED  3. Confirm consent - "In the setting of the current Covid19 crisis, you are scheduled for a (phone ) visit with Dr. [name] on 06/26/18 at 9 am.  Just as we do with many in-office visits, in order for you to participate in this visit, we must obtain consent.  If you'd like, I can send this to your mychart (if signed up) or email for you to review.  Otherwise, I can obtain your verbal consent now.  All virtual visits are billed to your insurance company just like a normal visit would be.  By agreeing to a virtual visit, we'd like you to understand that the technology does not allow for your provider to perform an examination, and thus may limit your provider's ability to fully assess your condition. If your provider identifies any concerns that need to be evaluated in person, we will make arrangements to do so.  Finally, though the technology is pretty good, we cannot assure that it will always work on either your or our end, and in the setting of a video visit, we may have to convert it to a phone-only visit.  In either situation, we cannot ensure that we have a secure connection.  Are you willing to proceed?" STAFF: Did the patient verbally acknowledge consent to telehealth visit? Document YES/NO here: YES PT GAVE VERBAL CONSENT TO TREAT OVER THE PHONE.  4. Advise patient to be prepared - "Two hours prior to your  appointment, go ahead and check your blood pressure, pulse, oxygen saturation, and your weight (if you have the equipment to check those) and write them all down. When your visit starts, your provider will ask you for this information. If you have an Apple Watch or Kardia device, please plan to have heart rate information ready on the day of your appointment. Please have a pen and paper handy nearby the day of the visit as well."--YES PT AWARE    5. Inform patient they will receive a phone call 15 minutes prior to their appointment time (may be from unknown caller ID) so they should be prepared to [place] has been deemed a candidate for a follow-up tele-health visit to limit community exposure during the Covid-19 pandemic. I spoke with the patient via phone to ensure availability of phone/video source, confirm preferred email & phone number, and discuss instructions and expectations.  I reminded Roy [name] to be prepared with any vital sign and/or heart rhythm information that could potentially be obtained via home monitoring, at the time of his visit. I reminded Roy [name] to expect a phone call prior to his visit.  Roy Alpha, LPN 5/64/3329 5:18 PM        FULL LENGTH CONSENT FOR TELE-HEALTH VISIT   I hereby voluntarily request, consent and authorize [place] and  its employed or Journalist, newspaper, Engineer, materials, nurse practitioners or other licensed health care professionals (the Practitioner), to provide me with telemedicine health care services (the "Services") as deemed necessary by the treating Practitioner. I acknowledge and consent to receive the Services by the Practitioner via telemedicine. I understand that the telemedicine visit will involve communicating with the Practitioner through live audiovisual communication technology and the disclosure of certain medical information by electronic transmission. I  acknowledge that I have been given the opportunity to request an in-person assessment or other available alternative prior to the telemedicine visit and am voluntarily participating in the telemedicine visit.  I understand that I have the right to withhold or withdraw my consent to the use of telemedicine in the course of my care at any time, without affecting my right to future care or treatment, and that the Practitioner or I may terminate the telemedicine visit at any time. I understand that I have the right to inspect all information obtained and/or recorded in the course of the telemedicine visit and may receive copies of available information for a reasonable fee.  I understand that some of the potential risks of receiving the Services via telemedicine include:  [name] Delay or interruption in medical evaluation due to technological equipment failure or disruption; . Information transmitted may not be sufficient (e.g. poor resolution of images) to allow for appropriate medical decision making by the Practitioner; and/or  . In rare instances, security protocols could fail, causing a breach of personal health information.  Furthermore, I acknowledge that it is my responsibility to provide information about my medical history, conditions and care that is complete and accurate to the best of my ability. I acknowledge that Practitioner's advice, recommendations, and/or decision may be based on factors not within their control, such as incomplete or inaccurate data provided by me or distortions of diagnostic images or specimens that may result from electronic transmissions. I understand that the practice of medicine is not an exact science and that Practitioner makes no warranties or guarantees regarding treatment outcomes. I acknowledge that I will receive a copy of this consent concurrently upon execution via email to the email address I last provided but may also request a printed copy by calling the office of  [place].    I understand that my insurance will be billed for this visit.   I have read or had this consent read to me. . I understand the contents of this consent, which adequately explains the benefits and risks of the Services being provided via telemedicine.  . I have been provided ample opportunity to ask questions regarding this consent and the Services and have had my questions answered to my satisfaction. . I give my informed consent for the services to be provided through the use of telemedicine in my medical care  By participating in this telemedicine visit I agree to the above.  YES PT GAVE VERBAL CONSENT TO TREAT OVER THE PHONE

## 2018-06-26 ENCOUNTER — Other Ambulatory Visit: Payer: Self-pay

## 2018-06-26 ENCOUNTER — Telehealth (INDEPENDENT_AMBULATORY_CARE_PROVIDER_SITE_OTHER): Payer: Medicare HMO | Admitting: Cardiology

## 2018-06-26 ENCOUNTER — Encounter: Payer: Self-pay | Admitting: Cardiology

## 2018-06-26 VITALS — BP 157/93 | HR 80 | Ht 73.0 in | Wt 225.0 lb

## 2018-06-26 DIAGNOSIS — I10 Essential (primary) hypertension: Secondary | ICD-10-CM

## 2018-06-26 DIAGNOSIS — I428 Other cardiomyopathies: Secondary | ICD-10-CM | POA: Diagnosis not present

## 2018-06-26 DIAGNOSIS — R55 Syncope and collapse: Secondary | ICD-10-CM | POA: Diagnosis not present

## 2018-06-26 DIAGNOSIS — E782 Mixed hyperlipidemia: Secondary | ICD-10-CM | POA: Diagnosis not present

## 2018-06-26 MED ORDER — PRAVASTATIN SODIUM 80 MG PO TABS: 80.0000 mg | ORAL_TABLET | Freq: Every day | ORAL | 2 refills | Status: DC

## 2018-06-26 MED ORDER — LISINOPRIL 40 MG PO TABS: 40.0000 mg | ORAL_TABLET | Freq: Every day | ORAL | 2 refills | Status: DC

## 2018-06-26 MED ORDER — AMLODIPINE BESYLATE 5 MG PO TABS: 5.0000 mg | ORAL_TABLET | Freq: Every day | ORAL | 2 refills | Status: DC

## 2018-06-26 MED ORDER — CARVEDILOL 3.125 MG PO TABS: ORAL_TABLET | ORAL | 2 refills | Status: DC

## 2018-06-26 NOTE — Progress Notes (Signed)
Virtual Visit via Video Note   This visit type was conducted due to national recommendations for restrictions regarding the COVID-19 Pandemic (e.g. social distancing) in an effort to limit this patient's exposure and mitigate transmission in our community.  Due to his co-morbid illnesses, this patient is at least at moderate risk for complications without adequate follow up.  This format is felt to be most appropriate for this patient at this time.  All issues noted in this document were discussed and addressed.  A limited physical exam was performed with this format.  Please refer to the patient's chart for his consent to telehealth for [facility].   Date:  06/26/2018   ID:  Roy [name], DOB Apr 03, 1945, MRN [id number]  Patient Location: Home Provider Location: Home  PCP:  Jani Gravel, MD  Cardiologist:  Ena Dawley, MD  Electrophysiologist:  None   Evaluation Performed:  Follow-Up Visit  Chief Complaint:  none  History of Present Illness:    Roy [name] is a 73 y.o. male with h/o DM2, HTN and HLD. He is followed by Dr. [name]. He also has been evaluated in the past for recurrent syncope. He has had multiple unexplained episodes in his lifetime. He went to the ER on two occasions and work up was negative. He had an echocardiogram September 2016  that showed mildly impaired LVEF 45-50% with regional wall motion abnormalities in the mid LAD distribution but has had no symptoms of chest pain. In October 2016 he had a negative nuclear stress test. He had a repeat 2D echo recently on 08/03/15 that showed improvement in LVEF to low normal at 50-55%.   He underwent 30 day event monitoring that showed no arrhythmias or pauses.   11/23/2017 -the patient is coming after 6 months, he had another episode of syncope after sitting for about 15 to 20 minutes in church, he was able to sit down as he felt warm rushing to his head, EMS was called EKG was normal, blood pressure was not recorded, he  recently had his blood work checked it was all normal except for triglycerides were 293.  He is in a research study for cholesterol management with [facility] with April McCaskill. He states that he does not drink water during the day.  He was recently diagnosed with Barrett's esophagus.  06/26/2018 - the patient is doing well, denies any chest pain, SOB, he remains very active and has no palpitations, no dizziness, presyncope or syncope, compliant with meds, doesn't check his BP.  The patient does not have symptoms concerning for COVID-19 infection (fever, chills, cough, or new shortness of breath).   Past Medical History:  Diagnosis Date  . Allergic rhinitis   . Allergy   . Arthritis   . Diabetes mellitus    pt states has never been told is Diabetic despite on 2 medicines   . GERD (gastroesophageal reflux disease)   . Hyperlipidemia   . Hypertension   . Hypovitaminosis D   . Nephrolithiasis   . Plantar fasciitis   . Syncope    Past Surgical History:  Procedure Laterality Date  . Abdominal Growth     non malignant growth   . COLONOSCOPY     06-2007, last colon 01-30-17 3 polyps and int hems   . POLYPECTOMY    . SHOULDER SURGERY     Bil/scar tissue in joints  . 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  TONSILLECTOMY AND ADENOIDECTOMY    . UPPER GASTROINTESTINAL ENDOSCOPY     x2     Current Meds  Medication Sig  . amLODipine (NORVASC) 5 MG tablet Take 1 tablet (5 mg total) by mouth daily.  [name] aspirin 81 MG tablet as directed. Take 1 tablet by mouth 5 times weekly.  . carvedilol (COREG) 3.125 MG tablet TAKE 1 TABLET BY MOUTH 2 TIMES DAILY WITH A MEAL.  . cholecalciferol (VITAMIN D) 1000 UNITS tablet Take 1,000 Units by mouth every evening. Vit D3  2000 IU-Take a pill daily  . glucosamine-chondroitin 500-400 MG tablet Take 1 tablet by mouth. 5 times weekly  . lisinopril (ZESTRIL) 40 MG tablet TAKE 1 TABLET BY MOUTH DAILY.  .  metFORMIN (GLUCOPHAGE) 1000 MG tablet Take 1,000 mg by mouth 2 (two) times daily with a meal.   . Multiple Vitamin (MULTIVITAMIN WITH MINERALS) TABS tablet Take 1 tablet by mouth. 5 times weekly  . omeprazole (PRILOSEC) 20 MG capsule Take 20 mg by mouth daily.  . pioglitazone (ACTOS) 30 MG tablet Take 30 mg by mouth daily.   . pravastatin (PRAVACHOL) 80 MG tablet Take 80 mg by mouth daily.  [name] UNABLE TO FIND Take 1 tablet by mouth 2 (two) times a day. Study cholesterol drug if drug name needed please call April McCaskill at [phone]  . vitamin B-12 (CYANOCOBALAMIN) 1000 MCG tablet Take 1,000 mcg by mouth. 5 times a week  . [DISCONTINUED] pravastatin (PRAVACHOL) 40 MG tablet Take 40 mg by mouth at bedtime.    Current Facility-Administered Medications for the 06/26/18 encounter (Telemedicine) with Dorothy Spark, MD  Medication  . 0.9 %  sodium chloride infusion     Allergies:   Patient has no known allergies.   Social History   Tobacco Use  . Smoking status: Former Smoker    Types: Pipe    Last attempt to quit: 11/29/1976    Years since quitting: 41.6  . Smokeless tobacco: Never Used  Substance Use Topics  . Alcohol use: Yes    Alcohol/week: 0.0 standard drinks    Comment: Occasional drink   . Drug use: No     Family Hx: The patient's family history includes Heart attack in his mother; Stroke in his father. There is no history of Colon cancer, Esophageal cancer, Pancreatic cancer, Rectal cancer, Stomach cancer, or Colon polyps.  ROS:   Please see the history of present illness.    All other systems reviewed and are negative.   Prior CV studies:   The following studies were reviewed today:  Labs/Other Tests and Data Reviewed:    EKG:  No ECG reviewed.  Recent Labs: No results found for requested labs within last 8760 hours.   Recent Lipid Panel No results found for: CHOL, TRIG, HDL, CHOLHDL, LDLCALC, LDLDIRECT  Wt Readings from Last 3 Encounters:  06/26/18 225  lb (102.1 kg)  11/23/17 233 lb (105.7 kg)  06/06/17 229 lb (103.9 kg)     Objective:    Vital Signs:  BP (!) 157/93   Pulse 80   Ht 6\' 1"  (1.854 m)   Wt 225 lb (102.1 kg)   BMI 29.69 kg/m    VITAL SIGNS:  reviewed  ASSESSMENT & PLAN:    1. Recurrent syncope: none since 2017.  2. 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  3. Hypertension - elevated today, however measured  after walking, he is instructed to do a BP diary and call us the results.  4. HLP - his most recent lipids show normal LDL, but severely elevated TG, he is a part of a research trial with a lipid lowering agent, we will contact them to see if adding Lovaza or fenofibrate is allowed.  COVID-19 Education: The signs and symptoms of COVID-19 were discussed with the patient and how to seek care for testing (follow up with PCP or arrange E-visit).  The importance of social distancing was discussed today.  Time:   Today, I have spent 25 minutes with the patient with telehealth technology discussing the above problems.     Medication Adjustments/Labs and Tests Ordered: Current medicines are reviewed at length with the patient today.  Concerns regarding medicines are outlined above.   Tests Ordered: No orders of the defined types were placed in this encounter.   Medication Changes: No orders of the defined types were placed in this encounter.   Disposition:  Follow up in 6 month(s)  Signed, Ena Dawley, MD  06/26/2018 10:14 AM    [place]

## 2018-06-26 NOTE — Patient Instructions (Signed)
Medication Instructions:   Your physician recommends that you continue on your current medications as directed. Please refer to the Current Medication list given to you today.  If you need a refill on your cardiac medications before your next appointment, please call your pharmacy.     Follow-Up: At [facility], you and your health needs are our priority.  As part of our continuing mission to provide you with exceptional heart care, we have created designated Provider Care Teams.  These Care Teams include your primary Cardiologist (physician) and Advanced Practice Providers (APPs -  Physician Assistants and Nurse Practitioners) who all work together to provide you with the care you need, when you need it.  Your physician wants you to follow-up in:  [place] will receive a reminder letter in the mail two months in advance. If you don't receive a letter, please call our office to schedule the follow-up appointment. PLEASE CALL THE OFFICE AROUND BEGINNING TO MIDDLE OF AUGUST TO HAVE THIS APPOINTMENT SCHEDULED.  DR NELSON'S SCHEDULE WILL BE OPEN AT THAT TIME.   *DR NELSON WOULD LIKE FOR YOU TO TAKE YOUR BP FOR ONE WEEK, LOG THIS INFORMATION ON PAPER, THEN CALL THIS TO THE OFFICE AT [phone], AND REPORT TO OUR OPERATORS YOUR VALUES--THEY WILL THEN ROUTE THIS INFORMATION TO DR [name] TO FURTHER REVIEW AND ADVISE ON AS NEEDED.

## 2018-07-04 ENCOUNTER — Telehealth: Payer: Self-pay | Admitting: Cardiology

## 2018-07-04 NOTE — Telephone Encounter (Signed)
Pt c/o medication issue:  1. Name of Medication: pravastatin (PRAVACHOL) 80 MG tablet  2. How are you currently taking this medication (dosage and times per day)? 80 mg tablet   3. Are you having a reaction (difficulty breathing--STAT)? No  4. What is your medication issue? Pt has been getting this medication from his PCP for a long time. He does not want to take two doses until he gets further clarification from Dr. [name].  He would like Ivy to call him between 3-5pm tomorrow.

## 2018-07-05 NOTE — Telephone Encounter (Signed)
Yes, I did call and talked to the research doctor, he doesn't want [place] to start anything else than the research drug.  Ena Dawley

## 2018-07-05 NOTE — Telephone Encounter (Signed)
Dr. [name], did you by chance get the information from research medication management,  April McCaskill at [phone], about his research trial program and you notifying them to make sure it was ok for him to start a medication to help with lowering of his TG? He is calling about that and is confused what he is to take.  He is currently taking Pravastatin 80 mg po daily.  Please advise!

## 2018-07-06 NOTE — Telephone Encounter (Signed)
Talked to the pt and informed him that Dr [name] did speak with the research MD, and he did not want [place] to start anything else than the research drug.  Advised the pt to continue his current med regimen. Pt verbalized understanding and agrees with this plan.

## 2018-08-16 DIAGNOSIS — H6123 Impacted cerumen, bilateral: Secondary | ICD-10-CM | POA: Diagnosis not present

## 2018-08-16 DIAGNOSIS — H903 Sensorineural hearing loss, bilateral: Secondary | ICD-10-CM | POA: Diagnosis not present

## 2018-11-08 ENCOUNTER — Other Ambulatory Visit: Payer: Self-pay | Admitting: Cardiology

## 2019-01-02 ENCOUNTER — Other Ambulatory Visit: Payer: Self-pay | Admitting: Cardiology

## 2019-02-10 ENCOUNTER — Ambulatory Visit: Payer: Medicare Other | Attending: Internal Medicine

## 2019-02-10 DIAGNOSIS — Z23 Encounter for immunization: Secondary | ICD-10-CM | POA: Insufficient documentation

## 2019-02-10 NOTE — Progress Notes (Signed)
   Covid-19 Vaccination Clinic  Name:  MICHALE FATULA    MRN: [id number] DOB: [birth year]  02/10/2019  Mr. Schmoldt was observed post Covid-19 immunization for 48 MINS without incidence. He was provided with Vaccine Information Sheet and instruction to access the V-Safe system.   Mr. Bevans was instructed to call 911 with any severe reactions post vaccine: [name] Difficulty breathing  . Swelling of your face and throat  . A fast heartbeat  . A bad rash all over your body  . Dizziness and weakness

## 2019-03-03 ENCOUNTER — Ambulatory Visit: Payer: Medicare Other | Attending: Internal Medicine

## 2019-03-03 DIAGNOSIS — Z23 Encounter for immunization: Secondary | ICD-10-CM | POA: Insufficient documentation

## 2019-03-03 NOTE — Progress Notes (Signed)
   Covid-19 Vaccination Clinic  Name:  Roy [name]    MRN: [id number] DOB: October 25, 1945  03/03/2019  [name] was observed post Covid-19 immunization for 15 minutes without incidence. He was provided with Vaccine Information Sheet and instruction to access the V-Safe system.   [name] was instructed to call 911 with any severe reactions post vaccine: [name] Difficulty breathing  . Swelling of your face and throat  . A fast heartbeat  . A bad rash all over your body  . Dizziness and weakness    Immunizations Administered    Name Date Dose VIS Date Route   Pfizer COVID-19 Vaccine 03/03/2019 11:44 AM 0.3 mL 01/04/2019 Intramuscular   Manufacturer: [place]   Lot: [id number]   [place]: [id number]

## 2019-03-04 DIAGNOSIS — M545 Low back pain: Secondary | ICD-10-CM | POA: Diagnosis not present

## 2019-03-04 DIAGNOSIS — M542 Cervicalgia: Secondary | ICD-10-CM | POA: Diagnosis not present

## 2019-03-04 DIAGNOSIS — M25511 Pain in right shoulder: Secondary | ICD-10-CM | POA: Diagnosis not present

## 2019-03-25 DIAGNOSIS — M19011 Primary osteoarthritis, right shoulder: Secondary | ICD-10-CM | POA: Diagnosis not present

## 2019-04-01 ENCOUNTER — Other Ambulatory Visit: Payer: Self-pay | Admitting: Cardiology

## 2019-05-01 DIAGNOSIS — E7800 Pure hypercholesterolemia, unspecified: Secondary | ICD-10-CM | POA: Diagnosis not present

## 2019-05-01 DIAGNOSIS — E119 Type 2 diabetes mellitus without complications: Secondary | ICD-10-CM | POA: Diagnosis not present

## 2019-05-01 DIAGNOSIS — Z0000 Encounter for general adult medical examination without abnormal findings: Secondary | ICD-10-CM | POA: Diagnosis not present

## 2019-05-01 DIAGNOSIS — R7309 Other abnormal glucose: Secondary | ICD-10-CM | POA: Diagnosis not present

## 2019-05-01 DIAGNOSIS — E538 Deficiency of other specified B group vitamins: Secondary | ICD-10-CM | POA: Diagnosis not present

## 2019-05-01 DIAGNOSIS — D509 Iron deficiency anemia, unspecified: Secondary | ICD-10-CM | POA: Diagnosis not present

## 2019-05-08 DIAGNOSIS — E119 Type 2 diabetes mellitus without complications: Secondary | ICD-10-CM | POA: Diagnosis not present

## 2019-05-08 DIAGNOSIS — I10 Essential (primary) hypertension: Secondary | ICD-10-CM | POA: Diagnosis not present

## 2019-05-08 DIAGNOSIS — D509 Iron deficiency anemia, unspecified: Secondary | ICD-10-CM | POA: Diagnosis not present

## 2019-05-08 DIAGNOSIS — I5189 Other ill-defined heart diseases: Secondary | ICD-10-CM | POA: Diagnosis not present

## 2019-05-08 DIAGNOSIS — E782 Mixed hyperlipidemia: Secondary | ICD-10-CM | POA: Diagnosis not present

## 2019-05-08 DIAGNOSIS — Z0000 Encounter for general adult medical examination without abnormal findings: Secondary | ICD-10-CM | POA: Diagnosis not present

## 2019-05-08 DIAGNOSIS — Z125 Encounter for screening for malignant neoplasm of prostate: Secondary | ICD-10-CM | POA: Diagnosis not present

## 2019-05-22 DIAGNOSIS — M19011 Primary osteoarthritis, right shoulder: Secondary | ICD-10-CM | POA: Diagnosis not present

## 2019-05-23 DIAGNOSIS — E119 Type 2 diabetes mellitus without complications: Secondary | ICD-10-CM | POA: Diagnosis not present

## 2019-05-24 DIAGNOSIS — Z0100 Encounter for examination of eyes and vision without abnormal findings: Secondary | ICD-10-CM | POA: Diagnosis not present

## 2019-05-28 DIAGNOSIS — M542 Cervicalgia: Secondary | ICD-10-CM | POA: Diagnosis not present

## 2019-05-28 DIAGNOSIS — M19011 Primary osteoarthritis, right shoulder: Secondary | ICD-10-CM | POA: Diagnosis not present

## 2019-05-28 DIAGNOSIS — M25511 Pain in right shoulder: Secondary | ICD-10-CM | POA: Diagnosis not present

## 2019-06-04 DIAGNOSIS — M25511 Pain in right shoulder: Secondary | ICD-10-CM | POA: Diagnosis not present

## 2019-06-04 DIAGNOSIS — M19011 Primary osteoarthritis, right shoulder: Secondary | ICD-10-CM | POA: Diagnosis not present

## 2019-06-04 DIAGNOSIS — M542 Cervicalgia: Secondary | ICD-10-CM | POA: Diagnosis not present

## 2019-06-06 DIAGNOSIS — M19011 Primary osteoarthritis, right shoulder: Secondary | ICD-10-CM | POA: Diagnosis not present

## 2019-06-06 DIAGNOSIS — M542 Cervicalgia: Secondary | ICD-10-CM | POA: Diagnosis not present

## 2019-06-06 DIAGNOSIS — M25511 Pain in right shoulder: Secondary | ICD-10-CM | POA: Diagnosis not present

## 2019-06-11 DIAGNOSIS — M542 Cervicalgia: Secondary | ICD-10-CM | POA: Diagnosis not present

## 2019-06-11 DIAGNOSIS — M25511 Pain in right shoulder: Secondary | ICD-10-CM | POA: Diagnosis not present

## 2019-06-11 DIAGNOSIS — M19011 Primary osteoarthritis, right shoulder: Secondary | ICD-10-CM | POA: Diagnosis not present

## 2019-06-13 DIAGNOSIS — M25511 Pain in right shoulder: Secondary | ICD-10-CM | POA: Diagnosis not present

## 2019-06-13 DIAGNOSIS — M19011 Primary osteoarthritis, right shoulder: Secondary | ICD-10-CM | POA: Diagnosis not present

## 2019-06-13 DIAGNOSIS — M542 Cervicalgia: Secondary | ICD-10-CM | POA: Diagnosis not present

## 2019-06-14 DIAGNOSIS — M19011 Primary osteoarthritis, right shoulder: Secondary | ICD-10-CM | POA: Diagnosis not present

## 2019-06-21 DIAGNOSIS — M542 Cervicalgia: Secondary | ICD-10-CM | POA: Diagnosis not present

## 2019-06-21 DIAGNOSIS — M25511 Pain in right shoulder: Secondary | ICD-10-CM | POA: Diagnosis not present

## 2019-06-26 DIAGNOSIS — M542 Cervicalgia: Secondary | ICD-10-CM | POA: Diagnosis not present

## 2019-06-26 DIAGNOSIS — M5412 Radiculopathy, cervical region: Secondary | ICD-10-CM | POA: Diagnosis not present

## 2019-06-26 DIAGNOSIS — M25511 Pain in right shoulder: Secondary | ICD-10-CM | POA: Diagnosis not present

## 2019-06-27 ENCOUNTER — Telehealth: Payer: Self-pay

## 2019-06-27 NOTE — Telephone Encounter (Signed)
   [facility] Pre-operative Risk Assessment    HEARTCARE STAFF: - Please ensure there is not already an duplicate clearance open for this procedure. - Under Visit Info/Reason for Call, type in Other and utilize the format Clearance MM/DD/YY or Clearance TBD. Do not use dashes or single digits. - If request is for dental extraction, please clarify the # of teeth to be extracted.  Request for surgical clearance:  1. What type of surgery is being performed? Right C7-  T1 ESI (Injection)  2. When is this surgery scheduled? TBD   3. What type of clearance is required (medical clearance vs. Pharmacy clearance to hold med vs. Both)? Both   4. Are there any medications that need to be held prior to surgery and how long? Aspirin 7 days piror    5. Practice name and name of physician performing surgery? [facility], Laroy Apple, MD   6. What is the office phone number? [phone]   7.   What is the office fax number? [phone] Attn: X-RAY  8.   Anesthesia type (None, local, MAC, general) ? None listed    Mendel Ryder 06/27/2019, 11:53 AM  _________________________________________________________________   (provider comments below)

## 2019-06-28 NOTE — Telephone Encounter (Signed)
   Primary Cardiologist: Ena Dawley, MD  Chart reviewed as part of pre-operative protocol coverage. Patient was contacted 06/28/2019 in reference to pre-operative risk assessment for pending surgery as outlined below.  Roy [name] was last seen virtually on 06/26/2018 by Dr. [name].  Since that day, Roy [name] has done well without chest pain or shortness of breath. He can accomplish at least 4 METS of activity without any issue.  Therefore, based on ACC/AHA guidelines, the patient would be at acceptable risk for the planned procedure without further cardiovascular testing.   I will route this recommendation to the requesting party via Epic fax function and remove from pre-op pool. Please call with questions.  He is aware if he needs more extensive surgery other than a simple back injection, he will need to schedule a follow up with Dr. [name]. Otherwise, he may hold aspirin for 7 days prior to the procedure and restart as soon as possible afterward.   [place], [place] 06/28/2019, 12:26 PM

## 2019-07-05 ENCOUNTER — Other Ambulatory Visit: Payer: Self-pay | Admitting: Cardiology

## 2019-07-11 DIAGNOSIS — M542 Cervicalgia: Secondary | ICD-10-CM | POA: Diagnosis not present

## 2019-07-11 DIAGNOSIS — M5412 Radiculopathy, cervical region: Secondary | ICD-10-CM | POA: Diagnosis not present

## 2019-07-12 ENCOUNTER — Telehealth: Payer: Self-pay | Admitting: Cardiology

## 2019-07-12 ENCOUNTER — Telehealth: Payer: Self-pay | Admitting: *Deleted

## 2019-07-12 DIAGNOSIS — M25511 Pain in right shoulder: Secondary | ICD-10-CM | POA: Diagnosis not present

## 2019-07-12 NOTE — Telephone Encounter (Signed)
   Primary Cardiologist:Katarina [name], MD  Chart reviewed as part of pre-operative protocol coverage. Because of Roy [name]'s past medical history and time since last visit, they will require a follow-up visit in order to better assess preoperative cardiovascular risk.  Pre-op covering staff: - Please schedule appointment and call patient to inform them. If patient already had an upcoming appointment within acceptable timeframe, please add "pre-op clearance" to the appointment notes so provider is aware. - Please contact requesting surgeon's office via preferred method (i.e, phone, fax) to inform them of need for appointment prior to surgery.  If applicable, this message will also be routed to pharmacy pool and/or primary cardiologist for input on holding anticoagulant/antiplatelet agent as requested below so that this information is available to the clearing provider at time of patient's appointment.   [place], [place]  07/12/2019, 5:28 PM

## 2019-07-12 NOTE — Telephone Encounter (Signed)
   [facility] Pre-operative Risk Assessment    HEARTCARE STAFF: - Please ensure there is not already an duplicate clearance open for this procedure. - Under Visit Info/Reason for Call, type in Other and utilize the format Clearance MM/DD/YY or Clearance TBD. Do not use dashes or single digits. - If request is for dental extraction, please clarify the # of teeth to be extracted.  Request for surgical clearance:  1. What type of surgery is being performed? RIGHT SHOULDER ROTATOR CUFF REPAIR   2. When is this surgery scheduled? TBD   3. What type of clearance is required (medical clearance vs. Pharmacy clearance to hold med vs. Both)? MEDICAL  4. Are there any medications that need to be held prior to surgery and how long? ASA    5. Practice name and name of physician performing surgery? [facility]; DR. Christia Reading MURPHY   6. What is the office phone number? [phone] [phone] (KELLY)   7.   What is the office fax number? [place].   Anesthesia type (None, local, MAC, general) ? CHOICE   Julaine Hua 07/12/2019, 2:13 PM  _________________________________________________________________   (provider comments below)

## 2019-07-12 NOTE — Telephone Encounter (Signed)
Spoke with the pt and informed him that our refill dept sent his medications in for 30 day supply vs 90 day, for they our following office policy in refilling these meds, until he can get into his appt with Dr. [name] in September. Informed the pt that this is nothing to be worried about, and when he gets down to his last few doses of both medications, he can call me and I will send in a refill for both, for 90 day supply with no further refills, until we see him in clinic on 10/01/19.  Pt verbalized understanding and agrees with this plan.  He states he will call me when he is getting low on his supply, to have the refills sent into his pharmacy of choice for 90 day supply.  Pt appreciative for the prompt follow-up and explaining the policy.

## 2019-07-12 NOTE — Telephone Encounter (Signed)
Pt c/o medication issue:  1. Name of Medication:  amLODipine (NORVASC) 5 MG tablet pravastatin (PRAVACHOL) 80 MG tablet   2. How are you currently taking this medication (dosage and times per day)? Once daily  3. Are you having a reaction (difficulty breathing--STAT)? no  4. What is your medication issue? PT normally gets a 90 day fill for these medications. He got his last fill but it was only for 30 days. He wanted to know why the amount of pills he received changed at the last moment. The patient can not see Dr. [name] until September but wants to go back to getting 90 day fills of his medications

## 2019-07-15 NOTE — Telephone Encounter (Signed)
I s/w the pt who is agreeable to pre op appt. Pt does not want to delay his surgery, states he is having a lot of shoulder pain. I did not have anything in the [place] , though pt is willing to go our [facility] office. I made an appt for tomorrow with Rosaria Ferries, [facility] for Pre Op assessment. Pt has thanked me for the help I have given him. I will forward notes to [facility] for upcoming appt. I will remove from the pre op call back pool.

## 2019-07-16 ENCOUNTER — Encounter: Payer: Self-pay | Admitting: Physician Assistant

## 2019-07-16 ENCOUNTER — Ambulatory Visit (INDEPENDENT_AMBULATORY_CARE_PROVIDER_SITE_OTHER): Payer: Medicare HMO | Admitting: Physician Assistant

## 2019-07-16 ENCOUNTER — Other Ambulatory Visit: Payer: Self-pay

## 2019-07-16 VITALS — BP 142/102 | HR 88 | Ht 73.0 in | Wt 215.2 lb

## 2019-07-16 DIAGNOSIS — Z01810 Encounter for preprocedural cardiovascular examination: Secondary | ICD-10-CM

## 2019-07-16 DIAGNOSIS — E782 Mixed hyperlipidemia: Secondary | ICD-10-CM | POA: Diagnosis not present

## 2019-07-16 DIAGNOSIS — I10 Essential (primary) hypertension: Secondary | ICD-10-CM | POA: Diagnosis not present

## 2019-07-16 DIAGNOSIS — I428 Other cardiomyopathies: Secondary | ICD-10-CM | POA: Diagnosis not present

## 2019-07-16 NOTE — Progress Notes (Signed)
Cardiology Office Note   Date:  07/16/2019   ID:  [name], Roy [name] [birth year], MRN [id number]  PCP:  Jani Gravel, MD Cardiologist:  Ena Dawley, MD 06/26/2018 televisit Electrphysiologist: None Rosaria Ferries, PA-C   History of Present Illness: Roy [name] is a 74 y.o. male with a history of DM2, HTN and HLD.  Recurrent syncope, with no cause found on event monitor, EF 45-50% in 2016 with normal Myoview, EF improved to 50-55% in 2017  Phone notes starting 06/27/2019 regarding back injection for which he was cleared and then right rotator cuff repair for which he needs an appointment  Roy [name] presents for cardiology follow up.  He has not passed out in close to 3 years. However, he never got a diagnosis. He has passed out 5-6 times total.  He feels this may be heat-related, all episodes have happened in the setting of being out in the heat. He is drinking more water now. He has had some presyncope, but sx resolved with getting in the shade, getting something to drink and resting. No episodes in > 3 yrs.   He had cortisone and PT for his shoulder and neck, but no help. 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 His R thumb is numb. 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 They are starting on his shoulder, he may need surgery on his neck as well.   He is active around the house, is refurbishing some outbuildings, mowing the grass. He does well, got some improvement in his sx w/ steroid injection.  He is frustrated because he cannot shoot right now.   He does not wake with LE edema, no orthopnea or PND.  He never gets chest pain.   He never has palpitations, no presyncope or syncope.   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 Please make overdue appt with Dr. [name] before anymore refills. 1st attempt 30 tablet 0   aspirin 81 MG tablet as directed. Take 1 tablet by mouth 5 times weekly.     carvedilol (COREG) 3.125 MG tablet TAKE 1 TABLET BY MOUTH 2 TIMES DAILY WITH A MEAL. 180 tablet 2   cholecalciferol (VITAMIN D) 1000 UNITS tablet Take 1,000 Units by mouth every evening. Vit D3  2000 IU-Take a pill daily     gabapentin (NEURONTIN) 300 MG capsule Take 300 mg by  mouth at bedtime.     glucosamine-chondroitin 500-400 MG tablet Take 1 tablet by mouth. 5 times weekly     lisinopril (ZESTRIL) 40 MG tablet TAKE 1 TABLET BY MOUTH DAILY. 90 tablet 2   metFORMIN (GLUCOPHAGE) 1000 MG tablet Take 1,000 mg by mouth 2 (two) times daily with a meal.      Multiple Vitamin (MULTIVITAMIN WITH MINERALS) TABS tablet Take 1 tablet by mouth. 5 times weekly     omeprazole (PRILOSEC) 20 MG capsule Take 20 mg by mouth daily.     pioglitazone (ACTOS) 30 MG tablet Take 30 mg by mouth daily.      pravastatin (PRAVACHOL) 80 MG tablet Take 1 tablet (80 mg total) by mouth daily. Please make overdue appt with Dr. [name] before anymore refills. 1st attempt 30 tablet 0   UNABLE TO FIND Take 1 tablet by mouth 2 (two) times a day. Study cholesterol drug if drug name needed please call April McCaskill at  [phone]     vitamin B-12 (CYANOCOBALAMIN) 1000 MCG tablet Take 1,000 mcg by mouth. 5 times a week     Current Facility-Administered Medications  Medication Dose Route Frequency Provider Last Rate Last Admin   0.9 %  sodium chloride infusion  500 mL Intravenous Once Ladene Artist, MD        Allergies:   Patient has no known allergies.    Social History:  The patient  reports that he quit smoking about 42 years ago. His smoking use included pipe. He has never used smokeless tobacco. He reports current alcohol use. He reports that he does not use drugs.   Family History:  The patient's family history includes Heart attack in his mother; Stroke in his father.  He indicated that his mother is deceased. He indicated that his father is deceased. He indicated that his brother is deceased. He indicated that the status of his neg hx is unknown.    ROS:  Please see the history of present illness. All other systems are reviewed and negative.    PHYSICAL EXAM: Blood pressure was rechecked, it was 126/80 VS:  BP (!) 142/102    Pulse 88    Ht 6\' 1"  (1.854 m)    Wt 215 lb 3.2 oz (97.6 kg)    SpO2 98%    BMI 28.39 kg/m  , BMI Body mass index is 28.39 kg/m. 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 The ekg ordered today demonstrates sinus rhythm, heart rate 88, minimal criteria for LVH, minor ST changes of unclear significance, no significant change from 10/2017 ECG  ECHO: 08/03/2015 - Left  ventricle: The cavity size was normal. Systolic function was  normal. The estimated ejection fraction was in the range of 50%  to 55%. There is akinesis of the apical septal myocardium. There  is akinesis of the apical myocardium. There is akinesis of the  midinferoseptal myocardium. There is akinesis of the  apicalinferolateral and inferior myocardium. There was an  increased relative contribution of atrial contraction to  ventricular filling. Doppler parameters are consistent with  abnormal left ventricular relaxation (grade 1 diastolic  dysfunction).  - Ventricular septum: Septal motion showed paradox. These changes  are consistent with intraventricular conduction delay.  - Aortic valve: Trileaflet; mildly thickened, mildly calcified  leaflets. Moderate focal thickening and calcification involving  the left coronary cusp.  - Mitral valve: There was mild regurgitation.  - Pulmonic valve: There was trivial regurgitation.   MYOVIEW: 11/03/2014  The left ventricular ejection fraction is moderately decreased (30-44%).  Nuclear stress EF: 37%.  There was no ST segment deviation noted during stress.  This is an intermediate risk study.  No ischemia.  There is LV systolic dysfunction with apical hypokinesis.   MONITOR: 2017  Sinus bradycardia to sinus tachycardia.  NO pauses or arrhythmias.    Recent Labs: No results found for requested labs within last 8760 hours.  CBC    Component Value Date/Time   WBC 12.9 (H) 09/27/2015 1348   RBC 4.36 09/27/2015 1348   HGB 12.5 (L) 09/27/2015 1348   HCT 39.1 09/27/2015 1348   PLT 243 09/27/2015 1348   MCV 89.7 09/27/2015 1348   MCH 28.7 09/27/2015 1348   MCHC 32.0 09/27/2015 1348   RDW 12.4 09/27/2015 1348   LYMPHSABS 1.4 09/27/2015 1348   MONOABS 0.8 09/27/2015 1348   EOSABS 0.1 09/27/2015 1348   BASOSABS 0.0 09/27/2015 1348   CMP Latest Ref Rng & Units 09/27/2015 03/12/2013  Glucose 65 - 99 mg/dL  123(H) 168(H)  BUN 6 - 20 mg/dL 13 15  Creatinine 0.61 - 1.24 mg/dL 1.05 0.95  Sodium 135 - 145 mmol/L 137 142  Potassium 3.5 - 5.1 mmol/L 3.9 4.3  Chloride 101 - 111 mmol/L 109 103  CO2 22 - 32 mmol/L 22 22  Calcium 8.9 - 10.3 mg/dL 8.8(L) 9.5  Total Protein 6.5 - 8.1 g/dL 6.6 -  Total Bilirubin 0.3 - 1.2 mg/dL 0.7 -  Alkaline Phos 38 - 126 U/L 50 -  AST 15 - 41 U/L 19 -  ALT 17 - 63 U/L 11(L) -     Lipid Panel No results found for: CHOL, HDL, LDLCALC, LDLDIRECT, TRIG, CHOLHDL    Wt Readings from Last 3 Encounters:  07/16/19 215 lb 3.2 oz (97.6 kg)  06/26/18 225 lb (102.1 kg)  11/23/17 233 lb (105.7 kg)     Other studies Reviewed: Additional studies/ records that were reviewed today include: Office notes, hospital records and testing.  ASSESSMENT AND PLAN:  1.  Preop cardiovascular evaluation:  - His RCRI is zero, therefore has 0.4% chance of perioperative cardiovascular event - DASI score is 39.45, for a functional capacity in METs of 7.59 - he is at acceptable risk for the surgery from a cardiovascular standpoint, without further cardiac testing.  2. Syncope - according to Mr Gelles, all episodes have occurred when he was exerting himself in the heat - he is preventing the episodes with improved hydration and being aware of the sx, he gets into shade as soon as sx begin  3. 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 HTN - BP was initially elevated, normalized on recheck - pt states BP goes up when he  is in pain - BP at home generally [id number] systolic and 87F-64P diastolic - would like DBP a little lower but do not wish to drop SBP too low in a pt w/ syncope hx - will leave med changes to MD  Current medicines are reviewed at length with the patient  today.  The patient does not have concerns regarding medicines.  The following changes have been made:  no change  Labs/ tests ordered today include:   Orders Placed This Encounter  Procedures   EKG 12-Lead     Disposition:   FU with Ena Dawley, MD  Signed, Rosaria Ferries, PA-C  07/16/2019 4:59 PM    [facility] Phone: [phone]; Fax: [phone]

## 2019-07-16 NOTE — Patient Instructions (Addendum)
Medication Instructions:  Your physician recommends that you continue on your current medications as directed. Please refer to the Current Medication list given to you today.  *If you need a refill on your cardiac medications before your next appointment, please call your pharmacy*   Follow-Up: At [facility], you and your health needs are our priority.  As part of our continuing mission to provide you with exceptional heart care, we have created designated Provider Care Teams.  These Care Teams include your primary Cardiologist (physician) and Advanced Practice Providers (APPs -  Physician Assistants and Nurse Practitioners) who all work together to provide you with the care you need, when you need it.  We recommend signing up for the patient portal called "MyChart".  Sign up information is provided on this After Visit Summary.  MyChart is used to connect with patients for Virtual Visits (Telemedicine).  Patients are able to view lab/test results, encounter notes, upcoming appointments, etc.  Non-urgent messages can be sent to your provider as well.   To learn more about what you can do with MyChart, go to [email or web address].    Your next appointment:   Keep your scheduled follow-up appointment with Dr. [name]   Other Instructions Your physician has requested that you regularly monitor and record your blood pressure readings at home 3-4 times per week. Please use the same machine at the same time of day to check your readings and record them to bring to your follow-up visit.  You have been cleared for surgery. We will send the note form today's visit to [place].    Diabetes Mellitus and Nutrition, Adult When you have diabetes (diabetes mellitus), it is very important to have healthy eating habits because your blood sugar (glucose) levels are greatly affected by what you eat and drink. Eating healthy foods in the appropriate amounts, at about the same times every day, can  help you:  Control your blood glucose.  Lower your risk of heart disease.  Improve your blood pressure.  Reach or maintain a healthy weight. Every person with diabetes is different, and each person has different needs for a meal plan. Your health care provider may recommend that you work with a diet and nutrition specialist (dietitian) to make a meal plan that is best for you. Your meal plan may vary depending on factors such as:  The calories you need.  The medicines you take.  Your weight.  Your blood glucose, blood pressure, and cholesterol levels.  Your activity level.  Other health conditions you have, such as heart or kidney disease. How do carbohydrates affect me? Carbohydrates, also called carbs, affect your blood glucose level more than any other type of food. Eating carbs naturally raises the amount of glucose in your blood. Carb counting is a method for keeping track of how many carbs you eat. Counting carbs is important to keep your blood glucose at a healthy level, especially if you use insulin or take certain oral diabetes medicines. It is important to know how many carbs you can safely have in each meal. This is different for every person. Your dietitian can help you calculate how many carbs you should have at each meal and for each snack. Foods that contain carbs include:  Bread, cereal, rice, pasta, and crackers.  Potatoes and corn.  Peas, beans, and lentils.  Milk and yogurt.  Fruit and juice.  Desserts, such as cakes, cookies, ice cream, and candy. How does alcohol affect me? Alcohol can cause a  sudden decrease in blood glucose (hypoglycemia), especially if you use insulin or take certain oral diabetes medicines. Hypoglycemia can be a life-threatening condition. Symptoms of hypoglycemia (sleepiness, dizziness, and confusion) are similar to symptoms of having too much alcohol. If your health care provider says that alcohol is safe for you, follow these  guidelines:  Limit alcohol intake to no more than 1 drink per day for nonpregnant women and 2 drinks per day for men. One drink equals 12 oz of beer, 5 oz of wine, or 1 oz of hard liquor.  Do not drink on an empty stomach.  Keep yourself hydrated with water, diet soda, or unsweetened iced tea.  Keep in mind that regular soda, juice, and other mixers may contain a lot of sugar and must be counted as carbs. What are tips for following this plan?  Reading food labels  Start by checking the serving size on the "Nutrition Facts" label of packaged foods and drinks. The amount of calories, carbs, fats, and other nutrients listed on the label is based on one serving of the item. Many items contain more than one serving per package.  Check the total grams (g) of carbs in one serving. You can calculate the number of servings of carbs in one serving by dividing the total carbs by 15. For example, if a food has 30 g of total carbs, it would be equal to 2 servings of carbs.  Check the number of grams (g) of saturated and trans fats in one serving. Choose foods that have low or no amount of these fats.  Check the number of milligrams (mg) of salt (sodium) in one serving. Most people should limit total sodium intake to less than 2,300 mg per day.  Always check the nutrition information of foods labeled as "low-fat" or "nonfat". These foods may be higher in added sugar or refined carbs and should be avoided.  Talk to your dietitian to identify your daily goals for nutrients listed on the label. Shopping  Avoid buying canned, premade, or processed foods. These foods tend to be high in fat, sodium, and added sugar.  Shop around the outside edge of the grocery store. This includes fresh fruits and vegetables, bulk grains, fresh meats, and fresh dairy. Cooking  Use low-heat cooking methods, such as baking, instead of high-heat cooking methods like deep frying.  Cook using healthy oils, such as olive,  canola, or sunflower oil.  Avoid cooking with butter, cream, or high-fat meats. Meal planning  Eat meals and snacks regularly, preferably at the same times every day. Avoid going long periods of time without eating.  Eat foods high in fiber, such as fresh fruits, vegetables, beans, and whole grains. Talk to your dietitian about how many servings of carbs you can eat at each meal.  Eat 4-6 ounces (oz) of lean protein each day, such as lean meat, chicken, fish, eggs, or tofu. One oz of lean protein is equal to: ? 1 oz of meat, chicken, or fish. ? 1 egg. ?  cup of tofu.  Eat some foods each day that contain healthy fats, such as avocado, nuts, seeds, and fish. Lifestyle  Check your blood glucose regularly.  Exercise regularly as told by your health care provider. This may include: ? 150 minutes of moderate-intensity or vigorous-intensity exercise each week. This could be brisk walking, biking, or water aerobics. ? Stretching and doing strength exercises, such as yoga or weightlifting, at least 2 times a week.  Take medicines as told  by your health care provider.  Do not use any products that contain nicotine or tobacco, such as cigarettes and e-cigarettes. If you need help quitting, ask your health care provider.  Work with a Social worker or diabetes educator to identify strategies to manage stress and any emotional and social challenges. Questions to ask a health care provider  Do I need to meet with a diabetes educator?  Do I need to meet with a dietitian?  What number can I call if I have questions?  When are the best times to check my blood glucose? Where to find more information:  American Diabetes Association: diabetes.org  Academy of Nutrition and Dietetics: www.eatright.[employer] of Diabetes and Digestive and Kidney Diseases (NIH): [employer] Summary  A healthy meal plan will help you control your blood glucose and maintain a healthy  lifestyle.  Working with a diet and nutrition specialist (dietitian) can help you make a meal plan that is best for you.  Keep in mind that carbohydrates (carbs) and alcohol have immediate effects on your blood glucose levels. It is important to count carbs and to use alcohol carefully. This information is not intended to replace advice given to you by your health care provider. Make sure you discuss any questions you have with your health care provider. Document Revised: 12/23/2016 Document Reviewed: 02/15/2016 Elsevier Patient Education  2020 [employer].

## 2019-07-31 ENCOUNTER — Other Ambulatory Visit: Payer: Self-pay | Admitting: Cardiology

## 2019-08-12 ENCOUNTER — Other Ambulatory Visit: Payer: Self-pay | Admitting: Cardiology

## 2019-08-12 DIAGNOSIS — M5412 Radiculopathy, cervical region: Secondary | ICD-10-CM | POA: Diagnosis not present

## 2019-08-12 DIAGNOSIS — M25511 Pain in right shoulder: Secondary | ICD-10-CM | POA: Diagnosis not present

## 2019-08-12 DIAGNOSIS — M542 Cervicalgia: Secondary | ICD-10-CM | POA: Diagnosis not present

## 2019-08-15 DIAGNOSIS — M75121 Complete rotator cuff tear or rupture of right shoulder, not specified as traumatic: Secondary | ICD-10-CM | POA: Diagnosis not present

## 2019-08-15 DIAGNOSIS — M24111 Other articular cartilage disorders, right shoulder: Secondary | ICD-10-CM | POA: Diagnosis not present

## 2019-08-15 DIAGNOSIS — M7551 Bursitis of right shoulder: Secondary | ICD-10-CM | POA: Diagnosis not present

## 2019-08-15 DIAGNOSIS — G8918 Other acute postprocedural pain: Secondary | ICD-10-CM | POA: Diagnosis not present

## 2019-08-15 DIAGNOSIS — M7521 Bicipital tendinitis, right shoulder: Secondary | ICD-10-CM | POA: Diagnosis not present

## 2019-08-15 DIAGNOSIS — S46011A Strain of muscle(s) and tendon(s) of the rotator cuff of right shoulder, initial encounter: Secondary | ICD-10-CM | POA: Diagnosis not present

## 2019-08-15 DIAGNOSIS — M7541 Impingement syndrome of right shoulder: Secondary | ICD-10-CM | POA: Diagnosis not present

## 2019-08-15 DIAGNOSIS — M19011 Primary osteoarthritis, right shoulder: Secondary | ICD-10-CM | POA: Diagnosis not present

## 2019-09-03 DIAGNOSIS — M25611 Stiffness of right shoulder, not elsewhere classified: Secondary | ICD-10-CM | POA: Diagnosis not present

## 2019-09-03 DIAGNOSIS — M6281 Muscle weakness (generalized): Secondary | ICD-10-CM | POA: Diagnosis not present

## 2019-09-03 DIAGNOSIS — M25511 Pain in right shoulder: Secondary | ICD-10-CM | POA: Diagnosis not present

## 2019-09-09 ENCOUNTER — Other Ambulatory Visit: Payer: Self-pay | Admitting: Cardiology

## 2019-09-09 DIAGNOSIS — M6281 Muscle weakness (generalized): Secondary | ICD-10-CM | POA: Diagnosis not present

## 2019-09-09 DIAGNOSIS — M25511 Pain in right shoulder: Secondary | ICD-10-CM | POA: Diagnosis not present

## 2019-09-09 DIAGNOSIS — M25611 Stiffness of right shoulder, not elsewhere classified: Secondary | ICD-10-CM | POA: Diagnosis not present

## 2019-09-10 ENCOUNTER — Other Ambulatory Visit: Payer: Self-pay

## 2019-09-10 MED ORDER — AMLODIPINE BESYLATE 5 MG PO TABS: 5.0000 mg | ORAL_TABLET | Freq: Every day | ORAL | 3 refills | Status: DC

## 2019-09-10 NOTE — Telephone Encounter (Signed)
Pt's medication was sent to pt's pharmacy as requested. Confirmation received.  °

## 2019-09-12 DIAGNOSIS — M25511 Pain in right shoulder: Secondary | ICD-10-CM | POA: Diagnosis not present

## 2019-09-12 DIAGNOSIS — M6281 Muscle weakness (generalized): Secondary | ICD-10-CM | POA: Diagnosis not present

## 2019-09-12 DIAGNOSIS — M25611 Stiffness of right shoulder, not elsewhere classified: Secondary | ICD-10-CM | POA: Diagnosis not present

## 2019-09-16 ENCOUNTER — Other Ambulatory Visit: Payer: Self-pay | Admitting: Cardiology

## 2019-09-19 DIAGNOSIS — M6281 Muscle weakness (generalized): Secondary | ICD-10-CM | POA: Diagnosis not present

## 2019-09-19 DIAGNOSIS — M25511 Pain in right shoulder: Secondary | ICD-10-CM | POA: Diagnosis not present

## 2019-09-19 DIAGNOSIS — M25611 Stiffness of right shoulder, not elsewhere classified: Secondary | ICD-10-CM | POA: Diagnosis not present

## 2019-09-26 DIAGNOSIS — M6281 Muscle weakness (generalized): Secondary | ICD-10-CM | POA: Diagnosis not present

## 2019-09-26 DIAGNOSIS — M25611 Stiffness of right shoulder, not elsewhere classified: Secondary | ICD-10-CM | POA: Diagnosis not present

## 2019-09-26 DIAGNOSIS — M25511 Pain in right shoulder: Secondary | ICD-10-CM | POA: Diagnosis not present

## 2019-10-01 ENCOUNTER — Ambulatory Visit: Payer: Medicare HMO | Admitting: Cardiology

## 2019-10-01 ENCOUNTER — Encounter: Payer: Self-pay | Admitting: Cardiology

## 2019-10-01 ENCOUNTER — Other Ambulatory Visit: Payer: Self-pay

## 2019-10-01 VITALS — BP 132/88 | HR 109 | Ht 73.0 in | Wt 219.8 lb

## 2019-10-01 DIAGNOSIS — M6281 Muscle weakness (generalized): Secondary | ICD-10-CM | POA: Diagnosis not present

## 2019-10-01 DIAGNOSIS — I10 Essential (primary) hypertension: Secondary | ICD-10-CM

## 2019-10-01 DIAGNOSIS — M25511 Pain in right shoulder: Secondary | ICD-10-CM | POA: Diagnosis not present

## 2019-10-01 DIAGNOSIS — E782 Mixed hyperlipidemia: Secondary | ICD-10-CM

## 2019-10-01 DIAGNOSIS — M25611 Stiffness of right shoulder, not elsewhere classified: Secondary | ICD-10-CM | POA: Diagnosis not present

## 2019-10-01 MED ORDER — CARVEDILOL 6.25 MG PO TABS: 6.2500 mg | ORAL_TABLET | Freq: Two times a day (BID) | ORAL | 1 refills | Status: DC

## 2019-10-01 MED ORDER — PRAVASTATIN SODIUM 80 MG PO TABS: 80.0000 mg | ORAL_TABLET | Freq: Every day | ORAL | 1 refills | Status: DC

## 2019-10-01 MED ORDER — AMLODIPINE BESYLATE 10 MG PO TABS: 10.0000 mg | ORAL_TABLET | Freq: Every day | ORAL | 1 refills | Status: DC

## 2019-10-01 MED ORDER — LISINOPRIL 40 MG PO TABS: 40.0000 mg | ORAL_TABLET | Freq: Every day | ORAL | 1 refills | Status: DC

## 2019-10-01 NOTE — Patient Instructions (Signed)
Medication Instructions:   INCREASE YOUR CARVEDILOL TO 6.25 MG BY MOUTH TWICE DAILY  *If you need a refill on your cardiac medications before your next appointment, please call your pharmacy*   Follow-Up: At [facility], you and your health needs are our priority.  As part of our continuing mission to provide you with exceptional heart care, we have created designated Provider Care Teams.  These Care Teams include your primary Cardiologist (physician) and Advanced Practice Providers (APPs -  Physician Assistants and Nurse Practitioners) who all work together to provide you with the care you need, when you need it.  We recommend signing up for the patient portal called "MyChart".  Sign up information is provided on this After Visit Summary.  MyChart is used to connect with patients for Virtual Visits (Telemedicine).  Patients are able to view lab/test results, encounter notes, upcoming appointments, etc.  Non-urgent messages can be sent to your provider as well.   To learn more about what you can do with MyChart, go to [email or web address].    Your next appointment:   6 month(s)  The format for your next appointment:   In Person  Provider:   Ena Dawley, MD

## 2019-10-01 NOTE — Progress Notes (Signed)
Cardiology Office Note  Date:  10/01/2019   ID:  Roy [name], Roy [name] 08-Jan-1946, MRN [id number]  PCP:  Jani Gravel, MD Cardiologist:  Ena Dawley, MD Electrphysiologist: None Ena Dawley, MD   History of Present Illness: Roy [name] is a 74 y.o. male with a history of DM2, HTN and HLD.  Recurrent syncope, with no cause found on event monitor, EF 45-50% in 2016 with normal Myoview, EF improved to 50-55% in 2017  The patient is coming for follow-up, he underwent shoulder surgery in July and is undergoing rehab.  He denies any chest pain or shortness of breath but has been having elevated blood pressure in the 1 30-1 50 range.  He denies any dizziness or syncope.  He has had no palpitations.  He has been compliant with his meds and has no side effects.  Past Medical History:  Diagnosis Date  . Allergic rhinitis   . Allergy   . Arthritis   . Diabetes mellitus    pt states borderline Diabetic, on 2 medicines w/ HgbA1c 6-7  . GERD (gastroesophageal reflux disease)   . Hyperlipidemia   . Hypertension   . Hypovitaminosis D   . Nephrolithiasis   . Plantar fasciitis   . Syncope     Past Surgical History:  Procedure Laterality Date  . Abdominal Growth     non malignant growth   . COLONOSCOPY     06-2007, last colon 01-30-17 3 polyps and int hems   . POLYPECTOMY    . SHOULDER SURGERY     Bil/scar tissue in joints  . 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 TONSILLECTOMY AND ADENOIDECTOMY    . UPPER GASTROINTESTINAL ENDOSCOPY     x2    Current Outpatient Medications  Medication Sig Dispense Refill  . amLODipine (NORVASC) 10 MG tablet Take 1 tablet (10 mg total) by mouth daily. 90 tablet 1  . Apoaequorin (PREVAGEN PO) Take by mouth.    [name] aspirin 81 MG tablet as directed. Take 1 tablet by mouth 5 times weekly.    . cholecalciferol (VITAMIN D) 1000 UNITS tablet Take 1,000 Units by mouth every evening. Vit D3  2000 IU-Take a  pill daily    . gabapentin (NEURONTIN) 300 MG capsule Take 300 mg by mouth at bedtime.    [name] glucosamine-chondroitin 500-400 MG tablet Take 1 tablet by mouth. 5 times weekly    . lisinopril (ZESTRIL) 40 MG tablet Take 1 tablet (40 mg total) by mouth daily. 90 tablet 1  . metFORMIN (GLUCOPHAGE) 1000 MG tablet Take 1,000 mg by mouth 2 (two) times daily with a meal.     . Multiple Vitamin (MULTIVITAMIN WITH MINERALS) TABS tablet Take 1 tablet by mouth. 5 times weekly    . omeprazole (PRILOSEC) 20 MG capsule Take 20 mg by mouth daily.    . pioglitazone (ACTOS) 30 MG tablet Take 30 mg by mouth daily.     . pravastatin (PRAVACHOL) 80 MG tablet Take 1 tablet (80 mg total) by mouth daily. 90 tablet 1  . UNABLE TO FIND Take 1 tablet by mouth 2 (two) times a day. Study cholesterol drug if drug name needed please call April McCaskill at [phone]    . vitamin B-12 (CYANOCOBALAMIN) 1000 MCG tablet Take 1,000 mcg by mouth. 5 times a week    . carvedilol (COREG) 6.25 MG tablet Take 1 tablet (6.25 mg total) by mouth 2 (  two) times daily. 180 tablet 1   Current Facility-Administered Medications  Medication Dose Route Frequency Provider Last Rate Last Admin  . 0.9 %  sodium chloride infusion  500 mL Intravenous Once Ladene Artist, MD        Allergies:   Patient has no known allergies.    Social History:  The patient  reports that he quit smoking about 42 years ago. His smoking use included pipe. He has never used smokeless tobacco. He reports current alcohol use. He reports that he does not use drugs.   Family History:  The patient's family history includes Heart attack in his mother; Stroke in his father.  He indicated that his mother is deceased. He indicated that his father is deceased. He indicated that his brother is deceased. He indicated that the status of his neg hx is unknown.  ROS:  Please see the history of present illness. All other systems are reviewed and negative.   PHYSICAL EXAM: Blood  pressure was rechecked, it was 126/80 VS:  BP 132/88   Pulse (!) 109   Ht 6\' 1"  (1.854 m)   Wt 219 lb 12.8 oz (99.7 kg)   SpO2 98%   BMI 29.00 kg/m  , BMI Body mass index is 29 kg/m. 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  This was personally reviewed.  ECHO: 08/03/2015 - Left ventricle: The cavity size was normal. Systolic function was  normal. The estimated ejection fraction was in the range of 50%  to 55%. There is akinesis of the apical septal myocardium. There  is akinesis of the apical myocardium. There is akinesis of the  midinferoseptal myocardium. There is akinesis of the  apicalinferolateral and inferior myocardium. There was an  increased relative contribution of atrial contraction to  ventricular filling. Doppler parameters are consistent with  abnormal left ventricular relaxation (grade 1 diastolic  dysfunction).  - Ventricular septum: Septal motion showed paradox. These changes  are consistent with intraventricular conduction delay.  - Aortic valve: Trileaflet; mildly thickened, mildly calcified  leaflets. Moderate focal thickening and calcification involving  the left coronary cusp.  - Mitral valve: There was mild regurgitation.  - Pulmonic valve: There was trivial regurgitation.   MYOVIEW: 11/03/2014  The left ventricular ejection fraction is moderately  decreased (30-44%).  Nuclear stress EF: 37%.  There was no ST segment deviation noted during stress.  This is an intermediate risk study.  No ischemia.  There is LV systolic dysfunction with apical hypokinesis.   MONITOR: 2017  Sinus bradycardia to sinus tachycardia.  NO pauses or arrhythmias.  Recent Labs: No results found for requested labs within last 8760 hours.  CBC    Component Value Date/Time   WBC 12.9 (H) 09/27/2015 1348   RBC 4.36 09/27/2015 1348   HGB 12.5 (L) 09/27/2015 1348   HCT 39.1 09/27/2015 1348   PLT 243 09/27/2015 1348   MCV 89.7  09/27/2015 1348   MCH 28.7 09/27/2015 1348   MCHC 32.0 09/27/2015 1348   RDW 12.4 09/27/2015 1348   LYMPHSABS 1.4 09/27/2015 1348   MONOABS 0.8 09/27/2015 1348   EOSABS 0.1 09/27/2015 1348   BASOSABS 0.0 09/27/2015 1348   CMP Latest Ref Rng & Units 09/27/2015 03/12/2013  Glucose 65 - 99 mg/dL 123(H) 168(H)  BUN 6 - 20 mg/dL 13 15  Creatinine 0.61 - 1.24 mg/dL 1.05 0.95  Sodium 135 - 145 mmol/L 137 142  Potassium 3.5 - 5.1 mmol/L 3.9 4.3  Chloride 101 - 111 mmol/L 109 103  CO2 22 - 32 mmol/L 22 22  Calcium 8.9 - 10.3 mg/dL 8.8(L) 9.5  Total Protein 6.5 - 8.1 g/dL 6.6 -  Total Bilirubin 0.3 - 1.2 mg/dL 0.7 -  Alkaline Phos 38 - 126 U/L 50 -  AST 15 - 41 U/L 19 -  ALT 17 - 63 U/L 11(L) -     Lipid Panel No results found for: CHOL, HDL, LDLCALC, LDLDIRECT, TRIG, CHOLHDL    Wt Readings from Last 3 Encounters:  10/01/19 219 lb 12.8 oz (99.7 kg)  07/16/19 215 lb 3.2 oz (97.6 kg)  06/26/18 225 lb (102.1 kg)     Other studies Reviewed: Additional studies/ records that were reviewed today include: Office notes, hospital records and testing.   ASSESSMENT AND PLAN:  Hypertension, -Also relative tachycardia, I will increase carvedilol to 6.25 mg p.o. twice daily, he will send [place] his blood pressure diary and we will readjust as needed.   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  Current medicines are reviewed at length with the patient today.  The patient does not have concerns regarding medicines.  The following changes have been made:  no change  Labs/ tests ordered today include:   Orders Placed This Encounter  Procedures  . EKG 12-Lead   Disposition:   FU with Ena Dawley, MD  Signed, Ena Dawley, MD  10/01/2019 2:30 PM    [place] Phone: [phone]; Fax: [phone]

## 2019-10-03 DIAGNOSIS — M25511 Pain in right shoulder: Secondary | ICD-10-CM | POA: Diagnosis not present

## 2019-10-03 DIAGNOSIS — M25611 Stiffness of right shoulder, not elsewhere classified: Secondary | ICD-10-CM | POA: Diagnosis not present

## 2019-10-03 DIAGNOSIS — M6281 Muscle weakness (generalized): Secondary | ICD-10-CM | POA: Diagnosis not present

## 2019-10-08 DIAGNOSIS — M25511 Pain in right shoulder: Secondary | ICD-10-CM | POA: Diagnosis not present

## 2019-10-08 DIAGNOSIS — M6281 Muscle weakness (generalized): Secondary | ICD-10-CM | POA: Diagnosis not present

## 2019-10-08 DIAGNOSIS — M25611 Stiffness of right shoulder, not elsewhere classified: Secondary | ICD-10-CM | POA: Diagnosis not present

## 2019-10-10 DIAGNOSIS — M25611 Stiffness of right shoulder, not elsewhere classified: Secondary | ICD-10-CM | POA: Diagnosis not present

## 2019-10-10 DIAGNOSIS — M25511 Pain in right shoulder: Secondary | ICD-10-CM | POA: Diagnosis not present

## 2019-10-10 DIAGNOSIS — M6281 Muscle weakness (generalized): Secondary | ICD-10-CM | POA: Diagnosis not present

## 2019-10-15 DIAGNOSIS — M25511 Pain in right shoulder: Secondary | ICD-10-CM | POA: Diagnosis not present

## 2019-10-15 DIAGNOSIS — M25611 Stiffness of right shoulder, not elsewhere classified: Secondary | ICD-10-CM | POA: Diagnosis not present

## 2019-10-15 DIAGNOSIS — M6281 Muscle weakness (generalized): Secondary | ICD-10-CM | POA: Diagnosis not present

## 2019-10-24 DIAGNOSIS — M542 Cervicalgia: Secondary | ICD-10-CM | POA: Diagnosis not present

## 2019-10-24 DIAGNOSIS — M25611 Stiffness of right shoulder, not elsewhere classified: Secondary | ICD-10-CM | POA: Diagnosis not present

## 2019-10-24 DIAGNOSIS — M6281 Muscle weakness (generalized): Secondary | ICD-10-CM | POA: Diagnosis not present

## 2019-10-24 DIAGNOSIS — M25511 Pain in right shoulder: Secondary | ICD-10-CM | POA: Diagnosis not present

## 2019-10-28 DIAGNOSIS — M25511 Pain in right shoulder: Secondary | ICD-10-CM | POA: Diagnosis not present

## 2019-10-31 DIAGNOSIS — M25611 Stiffness of right shoulder, not elsewhere classified: Secondary | ICD-10-CM | POA: Diagnosis not present

## 2019-10-31 DIAGNOSIS — M25511 Pain in right shoulder: Secondary | ICD-10-CM | POA: Diagnosis not present

## 2019-10-31 DIAGNOSIS — M6281 Muscle weakness (generalized): Secondary | ICD-10-CM | POA: Diagnosis not present

## 2019-11-07 DIAGNOSIS — M6281 Muscle weakness (generalized): Secondary | ICD-10-CM | POA: Diagnosis not present

## 2019-11-07 DIAGNOSIS — M25611 Stiffness of right shoulder, not elsewhere classified: Secondary | ICD-10-CM | POA: Diagnosis not present

## 2019-11-07 DIAGNOSIS — M25511 Pain in right shoulder: Secondary | ICD-10-CM | POA: Diagnosis not present

## 2019-11-19 DIAGNOSIS — M25511 Pain in right shoulder: Secondary | ICD-10-CM | POA: Diagnosis not present

## 2019-11-19 DIAGNOSIS — M25611 Stiffness of right shoulder, not elsewhere classified: Secondary | ICD-10-CM | POA: Diagnosis not present

## 2019-11-19 DIAGNOSIS — M6281 Muscle weakness (generalized): Secondary | ICD-10-CM | POA: Diagnosis not present

## 2019-11-28 DIAGNOSIS — M25511 Pain in right shoulder: Secondary | ICD-10-CM | POA: Diagnosis not present

## 2019-11-28 DIAGNOSIS — M6281 Muscle weakness (generalized): Secondary | ICD-10-CM | POA: Diagnosis not present

## 2019-11-28 DIAGNOSIS — M25611 Stiffness of right shoulder, not elsewhere classified: Secondary | ICD-10-CM | POA: Diagnosis not present

## 2019-11-29 DIAGNOSIS — R3912 Poor urinary stream: Secondary | ICD-10-CM | POA: Diagnosis not present

## 2019-11-29 DIAGNOSIS — N401 Enlarged prostate with lower urinary tract symptoms: Secondary | ICD-10-CM | POA: Diagnosis not present

## 2019-11-29 DIAGNOSIS — N451 Epididymitis: Secondary | ICD-10-CM | POA: Diagnosis not present

## 2019-12-02 DIAGNOSIS — M25511 Pain in right shoulder: Secondary | ICD-10-CM | POA: Diagnosis not present

## 2019-12-10 ENCOUNTER — Other Ambulatory Visit: Payer: Self-pay | Admitting: Internal Medicine

## 2019-12-10 ENCOUNTER — Ambulatory Visit: Payer: Medicare HMO | Attending: Internal Medicine

## 2019-12-10 DIAGNOSIS — Z23 Encounter for immunization: Secondary | ICD-10-CM

## 2019-12-10 NOTE — Progress Notes (Signed)
   Covid-19 Vaccination Clinic  Name:  Roy [name]    MRN: [id number] DOB: [birth year]  12/10/2019  [name] was observed post Covid-19 immunization for 15 minutes without incident. He was provided with Vaccine Information Sheet and instruction to access the V-Safe system.   [name] was instructed to call 911 with any severe reactions post vaccine: [name] Difficulty breathing  . Swelling of face and throat  . A fast heartbeat  . A bad rash all over body  . Dizziness and weakness   Immunizations Administered    Name Date Dose VIS Date Route   Pfizer COVID-19 Vaccine 12/10/2019  9:00 AM 0.3 mL 11/13/2019 Intramuscular   Manufacturer: [place]   Lot: [id number]   [place]: [id number]

## 2019-12-17 DIAGNOSIS — N453 Epididymo-orchitis: Secondary | ICD-10-CM | POA: Diagnosis not present

## 2020-01-07 DIAGNOSIS — N430 Encysted hydrocele: Secondary | ICD-10-CM | POA: Diagnosis not present

## 2020-01-07 DIAGNOSIS — N453 Epididymo-orchitis: Secondary | ICD-10-CM | POA: Diagnosis not present

## 2020-01-25 HISTORY — PX: MOHS SURGERY: SUR867

## 2020-01-29 DIAGNOSIS — L570 Actinic keratosis: Secondary | ICD-10-CM | POA: Diagnosis not present

## 2020-01-29 DIAGNOSIS — D485 Neoplasm of uncertain behavior of skin: Secondary | ICD-10-CM | POA: Diagnosis not present

## 2020-01-29 DIAGNOSIS — Z85828 Personal history of other malignant neoplasm of skin: Secondary | ICD-10-CM | POA: Diagnosis not present

## 2020-01-29 DIAGNOSIS — C44321 Squamous cell carcinoma of skin of nose: Secondary | ICD-10-CM | POA: Diagnosis not present

## 2020-01-29 DIAGNOSIS — L578 Other skin changes due to chronic exposure to nonionizing radiation: Secondary | ICD-10-CM | POA: Diagnosis not present

## 2020-02-18 DIAGNOSIS — Z85828 Personal history of other malignant neoplasm of skin: Secondary | ICD-10-CM | POA: Diagnosis not present

## 2020-02-18 DIAGNOSIS — C44321 Squamous cell carcinoma of skin of nose: Secondary | ICD-10-CM | POA: Diagnosis not present

## 2020-02-27 DIAGNOSIS — N509 Disorder of male genital organs, unspecified: Secondary | ICD-10-CM | POA: Diagnosis not present

## 2020-02-27 DIAGNOSIS — R772 Abnormality of alphafetoprotein: Secondary | ICD-10-CM | POA: Diagnosis not present

## 2020-03-03 ENCOUNTER — Encounter: Payer: Self-pay | Admitting: Gastroenterology

## 2020-03-04 ENCOUNTER — Other Ambulatory Visit: Payer: Self-pay | Admitting: Urology

## 2020-03-06 ENCOUNTER — Encounter (HOSPITAL_BASED_OUTPATIENT_CLINIC_OR_DEPARTMENT_OTHER): Payer: Self-pay | Admitting: Urology

## 2020-03-06 ENCOUNTER — Other Ambulatory Visit: Payer: Self-pay

## 2020-03-06 NOTE — Progress Notes (Addendum)
Spoke w/ via phone for pre-op interview--- PT Lab needs dos----  Istat            Lab results------ current ekg in epic/ chart COVID test ------ 03-07-2020 @ 0930 Arrive at ------- 0900 on 03-11-2020 NPO after MN NO Solid Food.  Clear liquids from MN until--- 0800 Medications to take morning of surgery ----- Coreg, Prilosec Diabetic medication ----- do not take metformin/ actos morning of surgery Patient Special Instructions ----- n/a Pre-Op special Istructions ----- FYI--  Pt is adamant that he is not a diabetic just borderline diabetic, stated has never been told by any doctor that he is even though he take two meds daily. And he does not check blood sugar at home.(however, per pt's pcp note dx DM2) Pt s/p moh's with skin graft of nose, stated still has bandage bridge of nose, concerned about mask. Patient verbalized understanding of instructions that were given at this phone interview. Patient denies shortness of breath, chest pain, fever, cough at this phone interview.   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

## 2020-03-07 ENCOUNTER — Other Ambulatory Visit (HOSPITAL_COMMUNITY)
Admission: RE | Admit: 2020-03-07 | Discharge: 2020-03-07 | Disposition: A | Discharge status: Home / Self Care | Payer: Medicare HMO | Source: Ambulatory Visit | Attending: Urology | Admitting: Urology

## 2020-03-07 DIAGNOSIS — Z20822 Contact with and (suspected) exposure to covid-19: Secondary | ICD-10-CM | POA: Insufficient documentation

## 2020-03-07 DIAGNOSIS — Z01812 Encounter for preprocedural laboratory examination: Secondary | ICD-10-CM | POA: Diagnosis not present

## 2020-03-08 LAB — SARS CORONAVIRUS 2 (TAT 6-24 HRS): SARS Coronavirus 2: NEGATIVE

## 2020-03-11 ENCOUNTER — Ambulatory Visit (HOSPITAL_BASED_OUTPATIENT_CLINIC_OR_DEPARTMENT_OTHER): Payer: Medicare HMO | Admitting: Anesthesiology

## 2020-03-11 ENCOUNTER — Other Ambulatory Visit: Payer: Self-pay

## 2020-03-11 ENCOUNTER — Encounter (HOSPITAL_BASED_OUTPATIENT_CLINIC_OR_DEPARTMENT_OTHER): Payer: Self-pay | Admitting: Urology

## 2020-03-11 ENCOUNTER — Ambulatory Visit (HOSPITAL_BASED_OUTPATIENT_CLINIC_OR_DEPARTMENT_OTHER)
Admission: RE | Admit: 2020-03-11 | Discharge: 2020-03-11 | Disposition: A | Discharge status: Home / Self Care | Payer: Medicare HMO | Attending: Urology | Admitting: Urology

## 2020-03-11 ENCOUNTER — Encounter (HOSPITAL_BASED_OUTPATIENT_CLINIC_OR_DEPARTMENT_OTHER)
Admission: RE | Disposition: A | Discharge status: Home / Self Care | Payer: Self-pay | Source: Home / Self Care | Attending: Urology

## 2020-03-11 DIAGNOSIS — N5089 Other specified disorders of the male genital organs: Secondary | ICD-10-CM | POA: Diagnosis not present

## 2020-03-11 DIAGNOSIS — I429 Cardiomyopathy, unspecified: Secondary | ICD-10-CM | POA: Diagnosis not present

## 2020-03-11 DIAGNOSIS — Z85828 Personal history of other malignant neoplasm of skin: Secondary | ICD-10-CM | POA: Insufficient documentation

## 2020-03-11 DIAGNOSIS — C8339 Diffuse large B-cell lymphoma, extranodal and solid organ sites: Secondary | ICD-10-CM | POA: Diagnosis not present

## 2020-03-11 DIAGNOSIS — Z7984 Long term (current) use of oral hypoglycemic drugs: Secondary | ICD-10-CM | POA: Insufficient documentation

## 2020-03-11 DIAGNOSIS — Z79899 Other long term (current) drug therapy: Secondary | ICD-10-CM | POA: Diagnosis not present

## 2020-03-11 DIAGNOSIS — E785 Hyperlipidemia, unspecified: Secondary | ICD-10-CM | POA: Diagnosis not present

## 2020-03-11 DIAGNOSIS — D4012 Neoplasm of uncertain behavior of left testis: Secondary | ICD-10-CM | POA: Diagnosis not present

## 2020-03-11 DIAGNOSIS — E119 Type 2 diabetes mellitus without complications: Secondary | ICD-10-CM | POA: Diagnosis not present

## 2020-03-11 DIAGNOSIS — Z87891 Personal history of nicotine dependence: Secondary | ICD-10-CM | POA: Insufficient documentation

## 2020-03-11 DIAGNOSIS — K219 Gastro-esophageal reflux disease without esophagitis: Secondary | ICD-10-CM | POA: Diagnosis not present

## 2020-03-11 DIAGNOSIS — I10 Essential (primary) hypertension: Secondary | ICD-10-CM | POA: Diagnosis not present

## 2020-03-11 HISTORY — DX: Personal history of other specified conditions: Z87.898

## 2020-03-11 HISTORY — DX: Other specified disorders of the male genital organs: N50.89

## 2020-03-11 HISTORY — DX: Personal history of malignant neoplasm, unspecified: Z85.9

## 2020-03-11 HISTORY — DX: Personal history of urinary calculi: Z87.442

## 2020-03-11 HISTORY — PX: ORCHIECTOMY: SHX2116

## 2020-03-11 HISTORY — DX: Presence of external hearing-aid: Z97.4

## 2020-03-11 HISTORY — DX: Nocturia: R35.1

## 2020-03-11 HISTORY — DX: Type 2 diabetes mellitus without complications: E11.9

## 2020-03-11 HISTORY — DX: Presence of spectacles and contact lenses: Z97.3

## 2020-03-11 HISTORY — DX: Other specified health status: Z78.9

## 2020-03-11 HISTORY — DX: Diaphragmatic hernia without obstruction or gangrene: K44.9

## 2020-03-11 HISTORY — DX: Other cardiomyopathies: I42.8

## 2020-03-11 HISTORY — DX: Other specified postprocedural states: Z98.890

## 2020-03-11 LAB — POCT I-STAT, CHEM 8
BUN: 14 mg/dL (ref 8–23)
Calcium, Ion: 1.27 mmol/L (ref 1.15–1.40)
Chloride: 105 mmol/L (ref 98–111)
Creatinine, Ser: 0.8 mg/dL (ref 0.61–1.24)
Glucose, Bld: 183 mg/dL — ABNORMAL HIGH (ref 70–99)
HCT: 42 % (ref 39.0–52.0)
Hemoglobin: 14.3 g/dL (ref 13.0–17.0)
Potassium: 3.9 mmol/L (ref 3.5–5.1)
Sodium: 143 mmol/L (ref 135–145)
TCO2: 24 mmol/L (ref 22–32)

## 2020-03-11 SURGERY — ORCHIECTOMY
Anesthesia: General | Site: Groin | Laterality: Left

## 2020-03-11 MED ORDER — DEXAMETHASONE SODIUM PHOSPHATE 10 MG/ML IJ SOLN
INTRAMUSCULAR | Status: AC
  Filled 2020-03-11: qty 1

## 2020-03-11 MED ORDER — BUPIVACAINE HCL (PF) 0.25 % IJ SOLN
INTRAMUSCULAR | Status: DC | PRN
  Administered 2020-03-11 (×2): 10 mL

## 2020-03-11 MED ORDER — PHENYLEPHRINE 40 MCG/ML (10ML) SYRINGE FOR IV PUSH (FOR BLOOD PRESSURE SUPPORT)
PREFILLED_SYRINGE | INTRAVENOUS | Status: AC
  Filled 2020-03-11: qty 10

## 2020-03-11 MED ORDER — BUPIVACAINE HCL (PF) 0.25 % IJ SOLN
INTRAMUSCULAR | Status: AC
  Filled 2020-03-11: qty 30

## 2020-03-11 MED ORDER — FENTANYL CITRATE (PF) 100 MCG/2ML IJ SOLN
INTRAMUSCULAR | Status: DC | PRN
  Administered 2020-03-11 (×8): 25 ug via INTRAVENOUS

## 2020-03-11 MED ORDER — HYDROCODONE-ACETAMINOPHEN 5-325 MG PO TABS: 1.0000 | ORAL_TABLET | ORAL | 0 refills | Status: DC | PRN

## 2020-03-11 MED ORDER — FENTANYL CITRATE (PF) 100 MCG/2ML IJ SOLN: 25.0000 ug | INTRAMUSCULAR | Status: DC | PRN

## 2020-03-11 MED ORDER — FENTANYL CITRATE (PF) 100 MCG/2ML IJ SOLN
INTRAMUSCULAR | Status: AC
  Filled 2020-03-11: qty 2

## 2020-03-11 MED ORDER — EPHEDRINE SULFATE 50 MG/ML IJ SOLN
INTRAMUSCULAR | Status: DC | PRN
  Administered 2020-03-11 (×2): 20 mg via INTRAVENOUS

## 2020-03-11 MED ORDER — ACETAMINOPHEN 10 MG/ML IV SOLN
1000.0000 mg | Freq: Once | INTRAVENOUS | Status: DC | PRN
Start: 2020-03-11 — End: 2020-03-11

## 2020-03-11 MED ORDER — LIDOCAINE HCL (CARDIAC) PF 100 MG/5ML IV SOSY
PREFILLED_SYRINGE | INTRAVENOUS | Status: DC | PRN
  Administered 2020-03-11: 100 mg via INTRAVENOUS

## 2020-03-11 MED ORDER — OXYCODONE HCL 5 MG/5ML PO SOLN: 5.0000 mg | Freq: Once | ORAL | Status: DC | PRN

## 2020-03-11 MED ORDER — 0.9 % SODIUM CHLORIDE (POUR BTL) OPTIME
TOPICAL | Status: DC | PRN
  Administered 2020-03-11: 500 mL

## 2020-03-11 MED ORDER — CEFAZOLIN SODIUM-DEXTROSE 2-4 GM/100ML-% IV SOLN
INTRAVENOUS | Status: AC
  Filled 2020-03-11: qty 100

## 2020-03-11 MED ORDER — LACTATED RINGERS IV SOLN: INTRAVENOUS | Status: DC

## 2020-03-11 MED ORDER — ONDANSETRON HCL 4 MG/2ML IJ SOLN: 4.0000 mg | Freq: Once | INTRAMUSCULAR | Status: DC | PRN

## 2020-03-11 MED ORDER — DEXAMETHASONE SODIUM PHOSPHATE 4 MG/ML IJ SOLN
INTRAMUSCULAR | Status: DC | PRN
  Administered 2020-03-11: 5 mg via INTRAVENOUS

## 2020-03-11 MED ORDER — PHENYLEPHRINE HCL (PRESSORS) 10 MG/ML IV SOLN
INTRAVENOUS | Status: DC | PRN
  Administered 2020-03-11: 40 ug via INTRAVENOUS
  Administered 2020-03-11 (×2): 80 ug via INTRAVENOUS
  Administered 2020-03-11: 40 ug via INTRAVENOUS
  Administered 2020-03-11: 80 ug via INTRAVENOUS

## 2020-03-11 MED ORDER — ONDANSETRON HCL 4 MG/2ML IJ SOLN
INTRAMUSCULAR | Status: AC
  Filled 2020-03-11: qty 2

## 2020-03-11 MED ORDER — KETOROLAC TROMETHAMINE 15 MG/ML IJ SOLN
INTRAMUSCULAR | Status: DC | PRN
  Administered 2020-03-11: 15 mg via INTRAVENOUS

## 2020-03-11 MED ORDER — PROPOFOL 10 MG/ML IV BOLUS
INTRAVENOUS | Status: DC | PRN
  Administered 2020-03-11: 20 mg via INTRAVENOUS
  Administered 2020-03-11: 30 mg via INTRAVENOUS
  Administered 2020-03-11: 150 mg via INTRAVENOUS

## 2020-03-11 MED ORDER — ONDANSETRON HCL 4 MG/2ML IJ SOLN
INTRAMUSCULAR | Status: DC | PRN
  Administered 2020-03-11: 4 mg via INTRAVENOUS

## 2020-03-11 MED ORDER — PROPOFOL 10 MG/ML IV BOLUS
INTRAVENOUS | Status: AC
  Filled 2020-03-11: qty 20

## 2020-03-11 MED ORDER — OXYCODONE HCL 5 MG PO TABS: 5.0000 mg | ORAL_TABLET | Freq: Once | ORAL | Status: DC | PRN

## 2020-03-11 MED ORDER — LIDOCAINE HCL (PF) 2 % IJ SOLN
INTRAMUSCULAR | Status: AC
  Filled 2020-03-11: qty 5

## 2020-03-11 MED ORDER — CEFAZOLIN SODIUM-DEXTROSE 2-4 GM/100ML-% IV SOLN
2.0000 g | Freq: Once | INTRAVENOUS | Status: AC
  Administered 2020-03-11: 2 g via INTRAVENOUS

## 2020-03-11 SURGICAL SUPPLY — 43 items
ADH SKN CLS APL DERMABOND .7 (GAUZE/BANDAGES/DRESSINGS) ×1
BLADE HEX COATED 2.75 (ELECTRODE) ×2 IMPLANT
BLADE SURG 15 STRL LF DISP TIS (BLADE) ×1 IMPLANT
BLADE SURG 15 STRL SS (BLADE) ×2
BNDG GAUZE ELAST 4 BULKY (GAUZE/BANDAGES/DRESSINGS) ×2 IMPLANT
COVER BACK TABLE 60X90IN (DRAPES) ×2 IMPLANT
COVER MAYO STAND STRL (DRAPES) ×2 IMPLANT
COVER WAND RF STERILE (DRAPES) ×2 IMPLANT
DERMABOND ADVANCED (GAUZE/BANDAGES/DRESSINGS) ×1
DERMABOND ADVANCED .7 DNX12 (GAUZE/BANDAGES/DRESSINGS) ×1 IMPLANT
DISSECTOR ROUND CHERRY 3/8 STR (MISCELLANEOUS) ×1 IMPLANT
DRAIN PENROSE 0.5X18 (DRAIN) IMPLANT
DRAPE LAPAROTOMY 100X72 PEDS (DRAPES) ×2 IMPLANT
ELECT REM PT RETURN 9FT ADLT (ELECTROSURGICAL) ×2
ELECTRODE REM PT RTRN 9FT ADLT (ELECTROSURGICAL) ×1 IMPLANT
GLOVE SURG ENC TEXT LTX SZ7.5 (GLOVE) ×2 IMPLANT
GLOVE SURG UNDER POLY LF SZ7.5 (GLOVE) ×2 IMPLANT
GOWN STRL REUS W/TWL XL LVL3 (GOWN DISPOSABLE) ×2 IMPLANT
KIT TURNOVER CYSTO (KITS) ×2 IMPLANT
NEEDLE HYPO 22GX1.5 SAFETY (NEEDLE) IMPLANT
NS IRRIG 500ML POUR BTL (IV SOLUTION) ×2 IMPLANT
PACK BASIN DAY SURGERY FS (CUSTOM PROCEDURE TRAY) ×2 IMPLANT
PENCIL SMOKE EVACUATOR (MISCELLANEOUS) ×2 IMPLANT
SPONGE LAP 18X18 RF (DISPOSABLE) ×1 IMPLANT
SUPPORT SCROTAL LG STRP (MISCELLANEOUS) ×2 IMPLANT
SUT CHROMIC 3 0 SH 27 (SUTURE) ×2 IMPLANT
SUT MNCRL AB 3-0 PS2 18 (SUTURE) IMPLANT
SUT MNCRL AB 4-0 PS2 18 (SUTURE) ×1 IMPLANT
SUT SILK 0 SH 30 (SUTURE) ×2 IMPLANT
SUT SILK 2 0 PERMA HAND 18 BK (SUTURE) IMPLANT
SUT SILK 2 0 SH (SUTURE) ×2 IMPLANT
SUT VIC AB 2-0 SH 27 (SUTURE) ×2
SUT VIC AB 2-0 SH 27XBRD (SUTURE) IMPLANT
SUT VIC AB 2-0 UR5 27 (SUTURE) IMPLANT
SUT VIC AB 2-0 UR6 27 (SUTURE) IMPLANT
SUT VIC AB 4-0 PS2 18 (SUTURE) IMPLANT
SUT VICRYL 0 TIES 12 18 (SUTURE) IMPLANT
SUT VICRYL 0 UR6 27IN ABS (SUTURE) ×1 IMPLANT
SYR CONTROL 10ML LL (SYRINGE) IMPLANT
TOWEL OR 17X26 10 PK STRL BLUE (TOWEL DISPOSABLE) ×2 IMPLANT
TRAY DSU PREP LF (CUSTOM PROCEDURE TRAY) ×2 IMPLANT
TUBE CONNECTING 12X1/4 (SUCTIONS) ×2 IMPLANT
YANKAUER SUCT BULB TIP NO VENT (SUCTIONS) ×2 IMPLANT

## 2020-03-11 NOTE — Anesthesia Procedure Notes (Signed)
Procedure Name: LMA Insertion Date/Time: 03/11/2020 11:06 AM Performed by: Justice Rocher, CRNA Pre-anesthesia Checklist: Patient identified, Emergency Drugs available, Suction available, Patient being monitored and Timeout performed Patient Re-evaluated:Patient Re-evaluated prior to induction Oxygen Delivery Method: Circle system utilized Preoxygenation: Pre-oxygenation with 100% oxygen Induction Type: IV induction Ventilation: Mask ventilation without difficulty LMA: LMA inserted LMA Size: 5.0 Number of attempts: 1 Airway Equipment and Method: Bite block Placement Confirmation: positive ETCO2,  breath sounds checked- equal and bilateral and CO2 detector Tube secured with: Tape Dental Injury: Teeth and Oropharynx as per pre-operative assessment

## 2020-03-11 NOTE — Anesthesia Postprocedure Evaluation (Signed)
Anesthesia Post Note  Patient: Roy [name]  Procedure(s) Performed: ORCHIECTOMY, INGUINAL (Left Groin)     Patient location during evaluation: PACU Anesthesia Type: General Level of consciousness: awake and alert Pain management: pain level controlled Vital Signs Assessment: post-procedure vital signs reviewed and stable Respiratory status: spontaneous breathing, nonlabored ventilation, respiratory function stable and patient connected to nasal cannula oxygen Cardiovascular status: blood pressure returned to baseline and stable Postop Assessment: no apparent nausea or vomiting Anesthetic complications: no   No complications documented.  Last Vitals:  Vitals:   03/11/20 1259 03/11/20 1300  BP: 133/89 133/89  Pulse: 100 (!) 101  Resp: 18 16  Temp:    SpO2: 94% 94%    Last Pain:  Vitals:   03/11/20 1300  TempSrc:   PainSc: 2                  [place] Tarquinio S

## 2020-03-11 NOTE — H&P (Signed)
PRE-OP H&P  Office Visit Report     02/27/2020   --------------------------------------------------------------------------------   Roy [name]  MRN: [id number]  DOB: 12-Sep-1945, 75 year old Male  PRIMARY CARE:  Teodora Medici. Maudie Mercury ([place]), MD  REFERRING:  Teodora Medici. [facility]), MD  PROVIDER:  Ellison Hughs, M.D.  LOCATION:  [facility] [phone]     --------------------------------------------------------------------------------   CC/HPI: Testicular swelling   Roy [name] is a 75 year old male with a history of left testicular swelling for the past 3 months.   11/29/19:  -He denies any associated pain with his testicular swelling which has been present for the past 4-6 weeks -He denies any recent GU surgeries, trauma or infections  -Hx of testicular swelling while playing "sports" decades ago  -Swelling has reduced over the past 2-3 weeks--not currently on abx  -Hx of BPH--previously treated with avodart--stopped 3-4 years.   -From a urinary standpoint, he reports a fluctuant force of stream with occasional episodes of post void dribbling. Overall, he states that he feels like he is emptying his bladder well. He has occasional episodes of urgency and frequency throughout the day, but is not overly bothered by it. Nocturia X2. He denies a prior history of UTIs, dysuria or hematuria.   12/17/2019: Diagnosed with left epididymo-orchitis at last office visit, confirmed with scrotal ultrasound. Patient was treated with cephalexin by his urologist. Also started on Tamsulosin for management of his LUTs at baseline. He returns today for repeat exam.   Patient reports no longer having pain or discomfort to the left testicle. Swelling has decreased a little bit by his report as well. He has concerns due to decreased sensation when palpating on the left side compared to the right. Denies interval fevers or chills, burning or painful urination, visible blood  in the urine. He continues tamsulosin as well and is tolerating without any noted side effect. He tells me he has not noted appreciable difference in regards to baseline lower urinary tract symptoms since beginning the medication. He does state he is not overly bothered by voiding symptoms at present time.   01/07/2020: Patient was doing better at last office visit but still having some continued his bothersome swelling to the left testicle. I repeated a course of antimicrobial therapy with doxycycline. He returns today for follow-up exam. Over the last several weeks he has noted a small decrease in size of the left testicle but still complaining of swelling and occasional discomfort depending on activity and position changes. This is somewhat concerning to him especially in regards to the absence of any pain or discomfort. He continues tamsulosin with stable grossly non bothersome lower urinary tract symptoms. He has had no interval burning or painful urination, visible blood in the urine. Denies interval fevers or chills.   02/27/20: The patient presents today with persistent left testicular swelling despite multiple rounds of antibiotics. He reports no pain associated with the left testicular enlargement, but notes that it can be bothersome and bulky when sitting for long periods of time. He notes that he is constantly having to adjust his genitals due to the size of the left testicular enlargement. He denies nausea/vomiting, fever/chills, dysuria or hematuria. He is currently on tamsulosin with minimal LUTS.   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     ALLERGIES: No Allergies    MEDICATIONS: Tamsulosin Hcl 0.4 mg capsule 1 capsule PO Daily  Amlodipine  Besy-Benazepril HCl - 10-20 MG Oral Capsule Oral  Carvedilol 3.125 mg tablet Oral  Daily Multivitamin With D3 0.4 mg tablet Oral  Glucosamine Chondroitin  Lisinopril 40 mg tablet Oral   Metformin Hcl 1,000 mg tablet Oral  Multivitamin  Omeprazole 20 MG Oral Capsule Delayed Release Oral  Pioglitazone Hcl 30 mg tablet  Pravastatin Sodium 80 mg tablet  Prevagen  Vitamin B12  Vitamin B12  Vitamin D3  Vitamin D3     GU PSH: No GU PSH      PSH Notes: Abdominal Surgery   NON-GU PSH: Shoulder Arthroscopy/surgery     GU PMH: Epididymo-orchitis - 01/07/2020, - 12/17/2019 Hydrocele - 01/07/2020 BPH w/LUTS - 11/29/2019, - 2017, Benign prostatic hyperplasia with urinary obstruction, - 2016 Epididymitis - 11/29/2019 Weak Urinary Stream - 11/29/2019 Peyronies Disease - 2017, Peyronie's disease, - 2015 Nocturia, Nocturia - 2016 Low back pain, Lower back pain - 2015      PMH Notes:  2006-02-10 09:29:35 - Note: Arthritis   NON-GU PMH: Encounter for general adult medical examination without abnormal findings, Encounter for preventive health examination - 2015 Glycosuria, Glycosuria - 2015 Personal history of other diseases of the circulatory system, History of hypertension - 2014 Personal history of other diseases of the digestive system, History of esophageal reflux - 2014 Arthritis Diabetes Type 2 GERD Hypercholesterolemia Hypertension    FAMILY HISTORY: Acute Myocardial Infarction - Mother Death - Mother, Father Ischemic Stroke - Father   SOCIAL HISTORY: Marital Status: Married Preferred Language: English; Ethnicity: Not Hispanic Or Latino; Race: White Current Smoking Status: Patient does not smoke anymore. Smoked for 5 years.  Does drink.  Drinks 1 caffeinated drink per day. 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    REVIEW OF SYSTEMS:    GU Review Male:   Patient denies frequent urination, hard to postpone urination, burning/ pain with urination, get up at night to urinate, leakage of urine, stream starts and stops, trouble starting your stream, have to strain to urinate , erection problems, and penile pain.  Gastrointestinal (Upper):   Patient  denies nausea, vomiting, and indigestion/ heartburn.  Gastrointestinal (Lower):   Patient denies diarrhea and constipation.  Constitutional:   Patient denies night sweats, fatigue, weight loss, and fever.  Skin:   Patient denies skin rash/ lesion and itching.  Eyes:   Patient denies blurred vision and double vision.  Ears/ Nose/ Throat:   Patient denies sore throat and sinus problems.  Hematologic/Lymphatic:   Patient denies swollen glands and easy bruising.  Cardiovascular:   Patient denies leg swelling and chest pains.  Respiratory:   Patient denies cough and shortness of breath.  Endocrine:   Patient denies excessive thirst.  Musculoskeletal:   Patient denies back pain and joint pain.  Neurological:   Patient denies headaches and dizziness.  Psychologic:   Patient denies depression and anxiety.   VITAL SIGNS:      02/27/2020 08:35 AM  Weight 220 lb / 99.79 kg  Height 73 in / 185.42 cm  BP 117/72 mmHg  Pulse 90 /min  Temperature 97.7 F / 36.5 C  BMI 29.0 kg/m   GU PHYSICAL EXAMINATION:    Scrotum: No lesions. No edema. No cysts. No warts.  Testes: Enlarged left testis. No tenderness, no swelling left testis. No tenderness, no swelling, no enlargement right testis. Normal location left testis. Normal location right testis. No mass, no cyst, no varicocele, no hydrocele left testis. No mass, no cyst, no varicocele, no hydrocele right testis.   Urethral Meatus:  Normal size. No lesion, no wart, no discharge, no polyp. Normal location.  Penis: Circumcised, no warts, no cracks. No dorsal Peyronie's plaques, no left corporal Peyronie's plaques, no right corporal Peyronie's plaques, no scarring, no warts. No balanitis, no meatal stenosis.   MULTI-SYSTEM PHYSICAL EXAMINATION:    Constitutional: Well-nourished. No physical deformities. Normally developed. Good grooming.  Neck: Neck symmetrical, not swollen. Normal tracheal position.  Respiratory: No labored breathing, no use of accessory  muscles.   Cardiovascular: Normal temperature, normal extremity pulses, no swelling, no varicosities.  Skin: No paleness, no jaundice, no cyanosis. No lesion, no ulcer, no rash.  Neurologic / Psychiatric: Oriented to time, oriented to place, oriented to person. No depression, no anxiety, no agitation.  Gastrointestinal: No hernia. No mass, no tenderness, no rigidity, non obese abdomen.   Musculoskeletal: Normal gait and station of head and neck.     Complexity of Data:  X-Ray Review: Scrotal Ultrasound: Reviewed Films. Reviewed Report. Discussed With Patient.     12/29/15 12/25/14 12/16/13 12/11/12 12/05/11 11/19/10 03/30/09 09/09/08  PSA  Total PSA 0.39  0.51  0.63  0.53  0.35  0.20  0.41  0.52     09/09/08  Hormones  Testosterone, Total 310.0     PROCEDURES:         Scrotal Ultrasound - 09381  Right Testicle: Length: 5.0 cm  Height: 2.2 cm  Width: 2.4 cm  Left Testicle: Length: 8.2 cm  Height: 5.6 cm  Width: 6.5 cm  Left Testis/Epididymis:  Left testicle enlarged and lobulated with inhomogeneous echotexture.  Right Testis/Epididymis:  Within Normal Limits       Patient confirmed No Neulasta OnPro Device.   Scrotal ultrasound reveals heterogenous echogenicity of the left testicle that has progressively worsened over subsequent exams. Minimal hydrocele fluid surrounding the left testicle. Internal blood flow to the left testicle is somewhat limited, but present. The right testicle appears to have homogenous echogenicity with good internal blood flow.         Urinalysis Dipstick Dipstick Cont'd  Color: Amber Bilirubin: Neg mg/dL  Appearance: Clear Ketones: Neg mg/dL  Specific Gravity: 1.025 Blood: Neg ery/uL  pH: 5.5 Protein: Trace mg/dL  Glucose: Neg mg/dL Urobilinogen: 0.2 mg/dL    Nitrites: Neg    Leukocyte Esterase: Neg leu/uL    ASSESSMENT:      ICD-10 Details  1 GU:   Disorder of male genital organs, unspecified - N50.9 Acute, Systemic Symptoms - Left  testicle with heterogenous echogenicity on scrotal ultrasound that has progressed over subsequent exams is concerning for cancerous process.   PLAN:           Orders Labs Beta HCG, Alpha Fetoprotein (AFP), LDH  X-Rays: Scrotal Ultrasound          Schedule Return Visit/Planned Activity: Next Available Appointment - Schedule Surgery          Document Letter(s):  Created for Patient: Clinical Summary   Created for Jani Gravel, M.D.         Notes:   -The progressive heterogenous echogenicity involving the left testicle seen on scrotal ultrasound today is concerning for potential malignancy as the source of his persistent left testicular enlargement. The risk, benefits and alternatives of left inguinal radical orchiectomy was discussed in detail. Risk include, but are not limited to bleeding, infection, chronic pain, inguinal/scrotal paresthesias, MI, CVA, DVT and the inherent risk of general anesthesia. He voices understanding and wishes to proceed.  -Testis cancer tumor markers pending.

## 2020-03-11 NOTE — Transfer of Care (Signed)
Immediate Anesthesia Transfer of Care Note  Patient: Roy [name]  Procedure(s) Performed: Procedure(s) (LRB): ORCHIECTOMY, INGUINAL (Left)  Patient Location: PACU  Anesthesia Type: General  Level of Consciousness: awake, sedated, patient cooperative and responds to stimulation  Airway & Oxygen Therapy: Patient Spontanous Breathing and Patient connected to [place] 02 and soft FM   Post-op Assessment: Report given to PACU RN, Post -op Vital signs reviewed and stable and Patient moving all extremities  Post vital signs: Reviewed and stable  Complications: No apparent anesthesia complications

## 2020-03-11 NOTE — Discharge Instructions (Signed)
° ° ° ° °  Post Anesthesia Home Care Instructions  Activity: Get plenty of rest for the remainder of the day. A responsible individual must stay with you for 24 hours following the procedure.  For the next 24 hours, DO NOT: -Drive a car -Paediatric nurse -Drink alcoholic beverages -Take any medication unless instructed by your physician -Make any legal decisions or sign important papers.  Meals: Start with liquid foods such as gelatin or soup. Progress to regular foods as tolerated. Avoid greasy, spicy, heavy foods. If nausea and/or vomiting occur, drink only clear liquids until the nausea and/or vomiting subsides. Call your physician if vomiting continues.  Special Instructions/Symptoms: Your throat may feel dry or sore from the anesthesia or the breathing tube placed in your throat during surgery. If this causes discomfort, gargle with warm salt water. The discomfort should disappear within 24 hours.  Do not take any nonsteroidal anti inflammatories until after 6:00 pm today.

## 2020-03-11 NOTE — Op Note (Signed)
Operative Note  Preoperative diagnosis:  1.  Left testicular mass  Postoperative diagnosis: 1.  Left testicular mass  Procedure(s): 1.  Left inguinal orchiectomy  Surgeon: Ellison Hughs, MD  Assistants:  None  Anesthesia:  General  Complications:  None  EBL: 10 mL  Specimens: 1.  Left testicle and spermatic cord  Drains/Catheters: 1.  None  Intraoperative findings:   1. Firm and enlarged left testicle 2. Direct left inguinal hernia measuring approximately 2 x 2 cm without incarceration  Indication:  Roy [name] is a 75 y.o. male with an enlarging left testicular mass with progressive heterogenous echogenicity concerning for malignancy on scrotal ultrasound.  He has been consented for the above procedures, voices understanding wishes to proceed.  Description of procedure:  After informed consent was obtained, the patient was brought to the operating room and general endotracheal anesthesia was administered.  The patient was placed in the supine position and prepped and draped in usual sterile fashion.  A timeout was then performed.  A 10 cm left inguinal incision was then made.  The overlying Scarpa's fascia and adipose tissue was incised using electrocautery until the left external ring aponeurosis of the external oblique was identified.    The left spermatic cord was then carefully dissected away from the underside of the left external oblique aponeurosis.  The external oblique aponeurosis was then sharply incised using Metzenbaum scissors.  The left ilioinguinal nerve was never clearly identified during the operation.  The left spermatic cord was then circumferentially mobilized and a Penrose drain was placed around it.  The left spermatic cord was then dissected cephalad as proximally as possible.  Patient was noted to have a 2 x 2 centimeter direct inguinal hernia without evidence of incarceration.  The left testicle was then brought up through the left inguinal  incision.  The gubernacular attachments of the left testicle were then freed using accommodation of electrocautery and blunt dissection.  There is no evidence of scrotal skin violation.  Once the left testicle and spermatic cord were fully mobilized, the left spermatic cord was bisected as proximally as possible using Kelly clamps.  Heavy scissors was then used to incise the left spermatic cord.  The left testicle and spermatic cord were passed off and sent to pathology for permanent section.  The remnant of the left spermatic cord was then suture-ligated with 0 silk suture, leaving tails on both stumps.  There was excellent hemostasis of the left spermatic cord following suture ligation.  Reinspection of the left inguinal canal and left hemiscrotum revealed no obvious bleeding.  The external oblique aponeurosis was then reapproximated using a running 0 Vicryl suture.  The skin was then closed in 2 layers and dressed with Dermabond.  The patient was then fashioned with cotton fluffs and scrotal support.  He tolerated the procedure well and was transferred to the postanesthesia in stable condition.  Plan: Discharge home.  Follow-up on 03/19/2020 to discuss pathology results.

## 2020-03-11 NOTE — Anesthesia Preprocedure Evaluation (Signed)
Anesthesia Evaluation  Patient identified by MRN, date of birth, ID band Patient awake    Reviewed: Allergy & Precautions, H&P , NPO status , Patient's Chart, lab work & pertinent test results  Airway Mallampati: II  TM Distance: >3 FB Neck ROM: Full    Dental no notable dental hx.    Pulmonary neg pulmonary ROS, former smoker,    Pulmonary exam normal breath sounds clear to auscultation       Cardiovascular hypertension, Pt. on medications Normal cardiovascular exam Rhythm:Regular Rate:Normal  Normal EF   Neuro/Psych negative neurological ROS  negative psych ROS   GI/Hepatic Neg liver ROS, GERD  ,  Endo/Other  diabetes, Well Controlled, Type 2  Renal/GU negative Renal ROS  negative genitourinary   Musculoskeletal negative musculoskeletal ROS (+)   Abdominal   Peds negative pediatric ROS (+)  Hematology negative hematology ROS (+)   Anesthesia Other Findings   Reproductive/Obstetrics negative OB ROS                             Anesthesia Physical Anesthesia Plan  ASA: II  Anesthesia Plan: General   Post-op Pain Management:    Induction: Intravenous  PONV Risk Score and Plan: 2 and Ondansetron, Dexamethasone and Treatment may vary due to age or medical condition  Airway Management Planned: LMA  Additional Equipment:   Intra-op Plan:   Post-operative Plan: Extubation in OR  Informed Consent: I have reviewed the patients History and Physical, chart, labs and discussed the procedure including the risks, benefits and alternatives for the proposed anesthesia with the patient or authorized representative who has indicated his/her understanding and acceptance.     Dental advisory given  Plan Discussed with: CRNA and Surgeon  Anesthesia Plan Comments:         Anesthesia Quick Evaluation

## 2020-03-12 ENCOUNTER — Encounter (HOSPITAL_BASED_OUTPATIENT_CLINIC_OR_DEPARTMENT_OTHER): Payer: Self-pay | Admitting: Urology

## 2020-03-16 LAB — SURGICAL PATHOLOGY

## 2020-03-19 DIAGNOSIS — C6212 Malignant neoplasm of descended left testis: Secondary | ICD-10-CM | POA: Diagnosis not present

## 2020-03-20 ENCOUNTER — Telehealth: Payer: Self-pay | Admitting: Oncology

## 2020-03-20 ENCOUNTER — Telehealth: Payer: Self-pay | Admitting: Hematology

## 2020-03-20 NOTE — Telephone Encounter (Signed)
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 [name] has been cld and scheduled to see Dr. Alen Blew on 3/2 at 11am. Pt aware to arrive 15 minutes early.

## 2020-03-20 NOTE — Telephone Encounter (Signed)
Roy [name] has been rescheduled to see Dr. Irene Limbo instead of Dr. Alen Blew on 3/2 at 11am.

## 2020-03-21 ENCOUNTER — Emergency Department (HOSPITAL_COMMUNITY): Payer: Medicare HMO

## 2020-03-21 ENCOUNTER — Other Ambulatory Visit: Payer: Self-pay

## 2020-03-21 ENCOUNTER — Inpatient Hospital Stay (HOSPITAL_COMMUNITY)
Admission: EM | Admit: 2020-03-21 | Discharge: 2020-04-02 | DRG: 233 | Disposition: A | Discharge status: Home / Self Care | Payer: Medicare HMO | Attending: Cardiothoracic Surgery | Admitting: Cardiothoracic Surgery

## 2020-03-21 ENCOUNTER — Encounter (HOSPITAL_COMMUNITY): Payer: Self-pay | Admitting: Cardiology

## 2020-03-21 DIAGNOSIS — I5022 Chronic systolic (congestive) heart failure: Secondary | ICD-10-CM

## 2020-03-21 DIAGNOSIS — J9601 Acute respiratory failure with hypoxia: Secondary | ICD-10-CM | POA: Diagnosis present

## 2020-03-21 DIAGNOSIS — Z9889 Other specified postprocedural states: Secondary | ICD-10-CM

## 2020-03-21 DIAGNOSIS — R7989 Other specified abnormal findings of blood chemistry: Secondary | ICD-10-CM

## 2020-03-21 DIAGNOSIS — Z20822 Contact with and (suspected) exposure to covid-19: Secondary | ICD-10-CM | POA: Diagnosis not present

## 2020-03-21 DIAGNOSIS — Z974 Presence of external hearing-aid: Secondary | ICD-10-CM | POA: Diagnosis not present

## 2020-03-21 DIAGNOSIS — D72829 Elevated white blood cell count, unspecified: Secondary | ICD-10-CM | POA: Diagnosis present

## 2020-03-21 DIAGNOSIS — I440 Atrioventricular block, first degree: Secondary | ICD-10-CM | POA: Diagnosis not present

## 2020-03-21 DIAGNOSIS — R0602 Shortness of breath: Secondary | ICD-10-CM | POA: Diagnosis not present

## 2020-03-21 DIAGNOSIS — I25118 Atherosclerotic heart disease of native coronary artery with other forms of angina pectoris: Secondary | ICD-10-CM | POA: Diagnosis present

## 2020-03-21 DIAGNOSIS — C8519 Unspecified B-cell lymphoma, extranodal and solid organ sites: Secondary | ICD-10-CM | POA: Diagnosis present

## 2020-03-21 DIAGNOSIS — I5023 Acute on chronic systolic (congestive) heart failure: Secondary | ICD-10-CM | POA: Diagnosis present

## 2020-03-21 DIAGNOSIS — Z823 Family history of stroke: Secondary | ICD-10-CM | POA: Diagnosis not present

## 2020-03-21 DIAGNOSIS — Z87442 Personal history of urinary calculi: Secondary | ICD-10-CM

## 2020-03-21 DIAGNOSIS — Z87891 Personal history of nicotine dependence: Secondary | ICD-10-CM

## 2020-03-21 DIAGNOSIS — J810 Acute pulmonary edema: Secondary | ICD-10-CM | POA: Diagnosis not present

## 2020-03-21 DIAGNOSIS — I499 Cardiac arrhythmia, unspecified: Secondary | ICD-10-CM | POA: Diagnosis not present

## 2020-03-21 DIAGNOSIS — I214 Non-ST elevation (NSTEMI) myocardial infarction: Secondary | ICD-10-CM | POA: Diagnosis not present

## 2020-03-21 DIAGNOSIS — E559 Vitamin D deficiency, unspecified: Secondary | ICD-10-CM | POA: Diagnosis present

## 2020-03-21 DIAGNOSIS — K449 Diaphragmatic hernia without obstruction or gangrene: Secondary | ICD-10-CM | POA: Diagnosis not present

## 2020-03-21 DIAGNOSIS — I4891 Unspecified atrial fibrillation: Secondary | ICD-10-CM | POA: Diagnosis not present

## 2020-03-21 DIAGNOSIS — R778 Other specified abnormalities of plasma proteins: Secondary | ICD-10-CM | POA: Diagnosis present

## 2020-03-21 DIAGNOSIS — I081 Rheumatic disorders of both mitral and tricuspid valves: Secondary | ICD-10-CM | POA: Diagnosis not present

## 2020-03-21 DIAGNOSIS — Z951 Presence of aortocoronary bypass graft: Secondary | ICD-10-CM

## 2020-03-21 DIAGNOSIS — K30 Functional dyspepsia: Secondary | ICD-10-CM

## 2020-03-21 DIAGNOSIS — R0902 Hypoxemia: Secondary | ICD-10-CM | POA: Diagnosis not present

## 2020-03-21 DIAGNOSIS — E876 Hypokalemia: Secondary | ICD-10-CM | POA: Diagnosis not present

## 2020-03-21 DIAGNOSIS — J811 Chronic pulmonary edema: Secondary | ICD-10-CM | POA: Diagnosis not present

## 2020-03-21 DIAGNOSIS — Z8249 Family history of ischemic heart disease and other diseases of the circulatory system: Secondary | ICD-10-CM

## 2020-03-21 DIAGNOSIS — I110 Hypertensive heart disease with heart failure: Secondary | ICD-10-CM | POA: Diagnosis not present

## 2020-03-21 DIAGNOSIS — Z01810 Encounter for preprocedural cardiovascular examination: Secondary | ICD-10-CM | POA: Diagnosis not present

## 2020-03-21 DIAGNOSIS — J90 Pleural effusion, not elsewhere classified: Secondary | ICD-10-CM | POA: Diagnosis not present

## 2020-03-21 DIAGNOSIS — T502X5A Adverse effect of carbonic-anhydrase inhibitors, benzothiadiazides and other diuretics, initial encounter: Secondary | ICD-10-CM | POA: Diagnosis not present

## 2020-03-21 DIAGNOSIS — I2510 Atherosclerotic heart disease of native coronary artery without angina pectoris: Secondary | ICD-10-CM | POA: Diagnosis not present

## 2020-03-21 DIAGNOSIS — I517 Cardiomegaly: Secondary | ICD-10-CM | POA: Diagnosis not present

## 2020-03-21 DIAGNOSIS — J939 Pneumothorax, unspecified: Secondary | ICD-10-CM | POA: Diagnosis not present

## 2020-03-21 DIAGNOSIS — R0689 Other abnormalities of breathing: Secondary | ICD-10-CM | POA: Diagnosis not present

## 2020-03-21 DIAGNOSIS — E785 Hyperlipidemia, unspecified: Secondary | ICD-10-CM | POA: Diagnosis present

## 2020-03-21 DIAGNOSIS — I2699 Other pulmonary embolism without acute cor pulmonale: Secondary | ICD-10-CM | POA: Diagnosis not present

## 2020-03-21 DIAGNOSIS — C8330 Diffuse large B-cell lymphoma, unspecified site: Secondary | ICD-10-CM

## 2020-03-21 DIAGNOSIS — I5021 Acute systolic (congestive) heart failure: Secondary | ICD-10-CM | POA: Diagnosis not present

## 2020-03-21 DIAGNOSIS — I472 Ventricular tachycardia: Secondary | ICD-10-CM | POA: Diagnosis not present

## 2020-03-21 DIAGNOSIS — N5089 Other specified disorders of the male genital organs: Secondary | ICD-10-CM | POA: Diagnosis present

## 2020-03-21 DIAGNOSIS — I25110 Atherosclerotic heart disease of native coronary artery with unstable angina pectoris: Secondary | ICD-10-CM | POA: Diagnosis not present

## 2020-03-21 DIAGNOSIS — M1990 Unspecified osteoarthritis, unspecified site: Secondary | ICD-10-CM | POA: Diagnosis present

## 2020-03-21 DIAGNOSIS — Z4682 Encounter for fitting and adjustment of non-vascular catheter: Secondary | ICD-10-CM | POA: Diagnosis not present

## 2020-03-21 DIAGNOSIS — I509 Heart failure, unspecified: Secondary | ICD-10-CM

## 2020-03-21 DIAGNOSIS — E119 Type 2 diabetes mellitus without complications: Secondary | ICD-10-CM | POA: Diagnosis present

## 2020-03-21 DIAGNOSIS — K219 Gastro-esophageal reflux disease without esophagitis: Secondary | ICD-10-CM | POA: Diagnosis present

## 2020-03-21 DIAGNOSIS — Z85828 Personal history of other malignant neoplasm of skin: Secondary | ICD-10-CM | POA: Diagnosis not present

## 2020-03-21 DIAGNOSIS — I471 Supraventricular tachycardia: Secondary | ICD-10-CM | POA: Diagnosis present

## 2020-03-21 DIAGNOSIS — Z7984 Long term (current) use of oral hypoglycemic drugs: Secondary | ICD-10-CM

## 2020-03-21 DIAGNOSIS — Z79899 Other long term (current) drug therapy: Secondary | ICD-10-CM

## 2020-03-21 DIAGNOSIS — R000 Tachycardia, unspecified: Secondary | ICD-10-CM | POA: Diagnosis not present

## 2020-03-21 DIAGNOSIS — J9811 Atelectasis: Secondary | ICD-10-CM | POA: Diagnosis not present

## 2020-03-21 DIAGNOSIS — I428 Other cardiomyopathies: Secondary | ICD-10-CM | POA: Diagnosis not present

## 2020-03-21 DIAGNOSIS — C4492 Squamous cell carcinoma of skin, unspecified: Secondary | ICD-10-CM

## 2020-03-21 DIAGNOSIS — R197 Diarrhea, unspecified: Secondary | ICD-10-CM | POA: Diagnosis not present

## 2020-03-21 DIAGNOSIS — R079 Chest pain, unspecified: Secondary | ICD-10-CM | POA: Diagnosis not present

## 2020-03-21 DIAGNOSIS — Z7982 Long term (current) use of aspirin: Secondary | ICD-10-CM

## 2020-03-21 HISTORY — DX: Heart failure, unspecified: I50.9

## 2020-03-21 LAB — COMPREHENSIVE METABOLIC PANEL
ALT: 12 U/L (ref 0–44)
AST: 21 U/L (ref 15–41)
Albumin: 3.4 g/dL — ABNORMAL LOW (ref 3.5–5.0)
Alkaline Phosphatase: 59 U/L (ref 38–126)
Anion gap: 9 (ref 5–15)
BUN: 17 mg/dL (ref 8–23)
CO2: 22 mmol/L (ref 22–32)
Calcium: 9.4 mg/dL (ref 8.9–10.3)
Chloride: 108 mmol/L (ref 98–111)
Creatinine, Ser: 0.95 mg/dL (ref 0.61–1.24)
GFR, Estimated: >60 mL/min (ref >60)
Glucose, Bld: 201 mg/dL — ABNORMAL HIGH (ref 70–99)
Potassium: 4.1 mmol/L (ref 3.5–5.1)
Sodium: 139 mmol/L (ref 135–145)
Total Bilirubin: 0.8 mg/dL (ref 0.3–1.2)
Total Protein: 6.4 g/dL — ABNORMAL LOW (ref 6.5–8.1)

## 2020-03-21 LAB — LIPID PANEL
Cholesterol: 140 mg/dL (ref 0–200)
HDL: 36 mg/dL — ABNORMAL LOW (ref >40)
LDL Cholesterol: 74 mg/dL (ref 0–99)
Total CHOL/HDL Ratio: 3.9 RATIO
Triglycerides: 148 mg/dL (ref <150)
VLDL: 30 mg/dL (ref 0–40)

## 2020-03-21 LAB — CBC WITH DIFFERENTIAL/PLATELET
Abs Immature Granulocytes: 0.07 10*3/uL (ref 0.00–0.07)
Basophils Absolute: 0.1 10*3/uL (ref 0.0–0.1)
Basophils Relative: 1 %
Eosinophils Absolute: 0.1 10*3/uL (ref 0.0–0.5)
Eosinophils Relative: 1 %
HCT: 37.6 % — ABNORMAL LOW (ref 39.0–52.0)
Hemoglobin: 12.3 g/dL — ABNORMAL LOW (ref 13.0–17.0)
Immature Granulocytes: 0 %
Lymphocytes Relative: 8 %
Lymphs Abs: 1.3 10*3/uL (ref 0.7–4.0)
MCH: 30.4 pg (ref 26.0–34.0)
MCHC: 32.7 g/dL (ref 30.0–36.0)
MCV: 92.8 fL (ref 80.0–100.0)
Monocytes Absolute: 0.9 10*3/uL (ref 0.1–1.0)
Monocytes Relative: 6 %
Neutro Abs: 14.2 10*3/uL — ABNORMAL HIGH (ref 1.7–7.7)
Neutrophils Relative %: 84 %
Platelets: 382 10*3/uL (ref 150–400)
RBC: 4.05 MIL/uL — ABNORMAL LOW (ref 4.22–5.81)
RDW: 13.7 % (ref 11.5–15.5)
WBC: 16.6 10*3/uL — ABNORMAL HIGH (ref 4.0–10.5)
nRBC: 0 % (ref 0.0–0.2)

## 2020-03-21 LAB — RESP PANEL BY RT-PCR (FLU A&B, COVID) ARPGX2
Influenza A by PCR: NEGATIVE
Influenza B by PCR: NEGATIVE
SARS Coronavirus 2 by RT PCR: NEGATIVE

## 2020-03-21 LAB — TROPONIN I (HIGH SENSITIVITY)
Troponin I (High Sensitivity): 551 ng/L (ref <18)
Troponin I (High Sensitivity): 773 ng/L (ref <18)
Troponin I (High Sensitivity): 1257 ng/L (ref <18)
Troponin I (High Sensitivity): 1389 ng/L (ref <18)
Troponin I (High Sensitivity): 1415 ng/L (ref <18)

## 2020-03-21 LAB — GLUCOSE, CAPILLARY
Glucose-Capillary: 186 mg/dL — ABNORMAL HIGH (ref 70–99)
Glucose-Capillary: 165 mg/dL — ABNORMAL HIGH (ref 70–99)
Glucose-Capillary: 170 mg/dL — ABNORMAL HIGH (ref 70–99)

## 2020-03-21 LAB — BRAIN NATRIURETIC PEPTIDE: B Natriuretic Peptide: 648.5 pg/mL — ABNORMAL HIGH (ref 0.0–100.0)

## 2020-03-21 LAB — HEMOGLOBIN A1C
Hgb A1c MFr Bld: 6.2 % — ABNORMAL HIGH (ref 4.8–5.6)
Mean Plasma Glucose: 131.24 mg/dL

## 2020-03-21 LAB — HEPARIN LEVEL (UNFRACTIONATED)
Heparin Unfractionated: 0.17 IU/mL — ABNORMAL LOW (ref 0.30–0.70)
Heparin Unfractionated: 0.33 IU/mL (ref 0.30–0.70)

## 2020-03-21 LAB — D-DIMER, QUANTITATIVE: D-Dimer, Quant: 0.92 ug/mL-FEU — ABNORMAL HIGH (ref 0.00–0.50)

## 2020-03-21 IMAGING — DX DG CHEST 1V PORT
1 series · 1 of 1 positions shown · non-contrast
Comparison: None.

CLINICAL DATA: Shortness of breath

EXAM:
PORTABLE CHEST 1 VIEW

[chest ap]
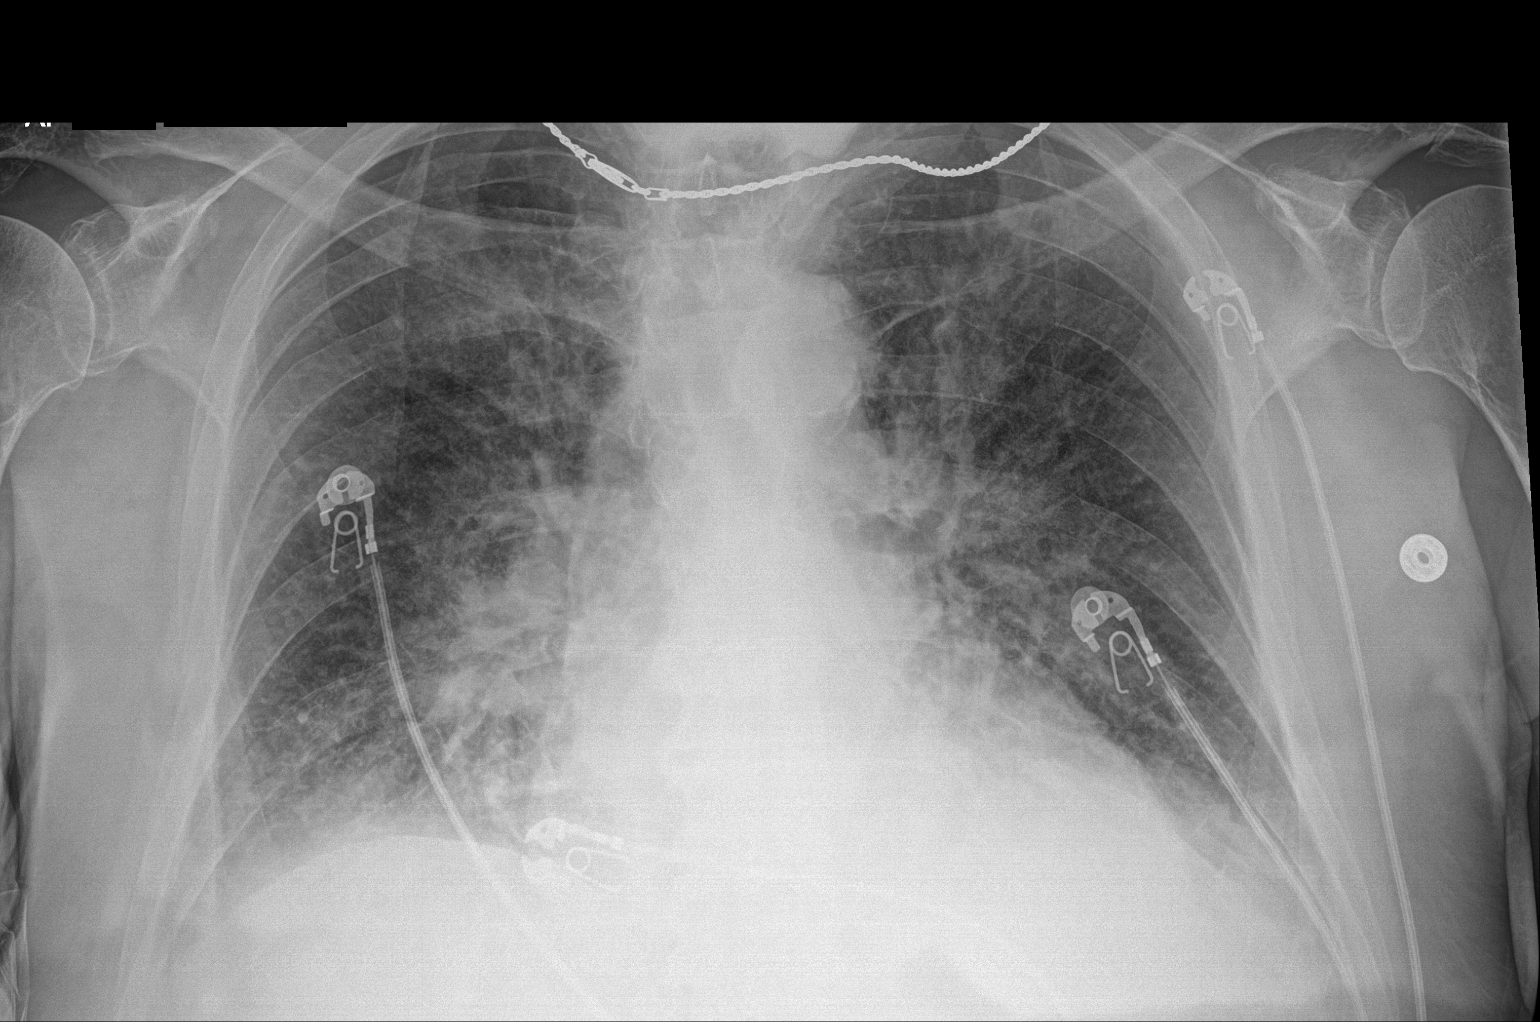

[1 of 1 positions shown; findings below may reference images not displayed]

FINDINGS: The heart size and mediastinal contours are mildly enlarged. Aortic
knob calcifications are seen. Diffusely increased hazy interstitial
airspace opacity seen throughout both lungs. No acute osseous
abnormality.
IMPRESSION: Interstitial airspace opacities throughout both lungs which could be
due to edema, inflammatory, and/or infectious etiology

## 2020-03-21 IMAGING — CT CT ANGIO CHEST
2 of 7 series · 17 of 46 positions shown · IV contrast (APPLIED)
Comparison: None.

CLINICAL DATA: Pulmonary embolism, elevated D-dimer

EXAM:
CT ANGIOGRAPHY CHEST WITH CONTRAST
TECHNIQUE: Multidetector CT imaging of the chest was performed using the
standard protocol during bolus administration of intravenous
contrast. Multiplanar CT image reconstructions and MIPs were
obtained to evaluate the vascular anatomy.
CONTRAST:  70mL OMNIPAQUE IOHEXOL 350 MG/ML SOLN

[Series 7: thins · axial · 0.83mm/px · z∈[+1224,+1481]mm · 14 of 414 slices shown]
[im 23/414  lung]
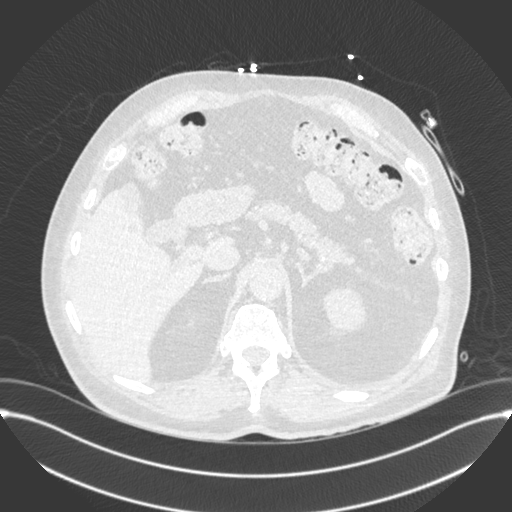
[im 46/414  soft-tissue]
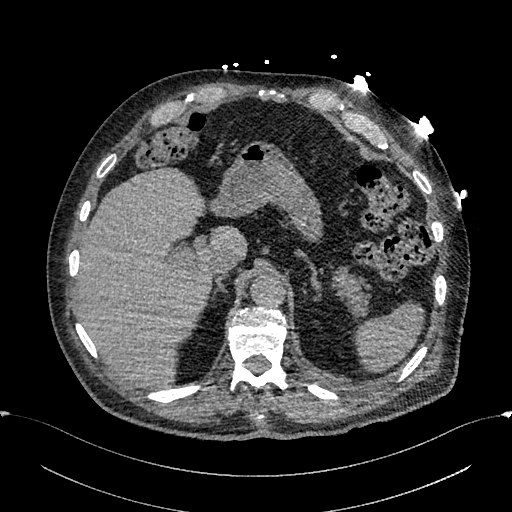
[im 92/414  lung]
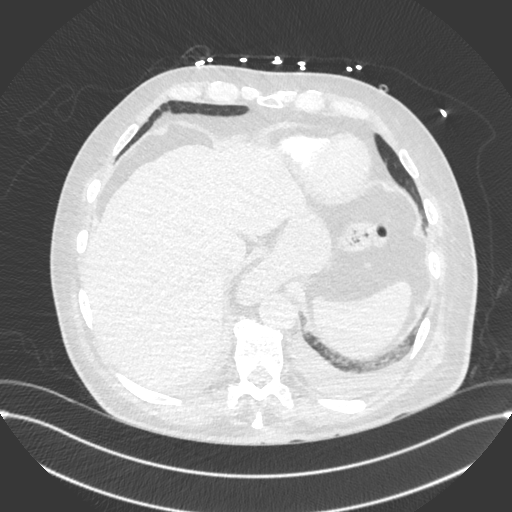
[im 115/414  soft-tissue]
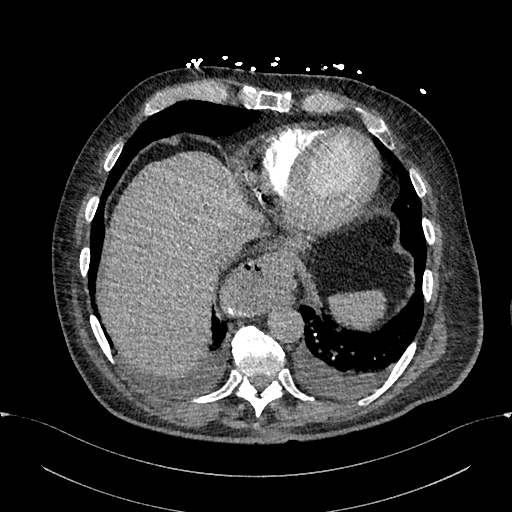
[im 138/414  lung]
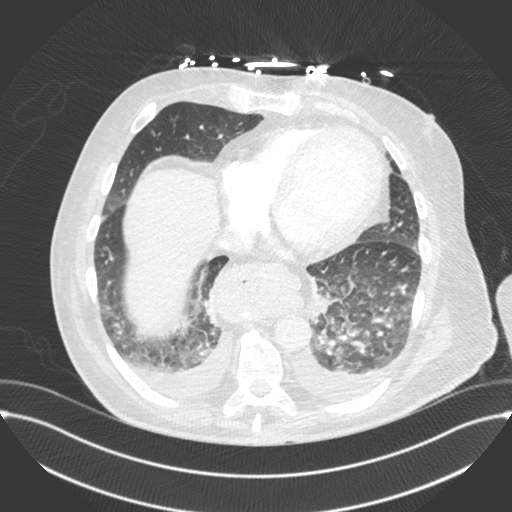
[im 161/414  soft-tissue]
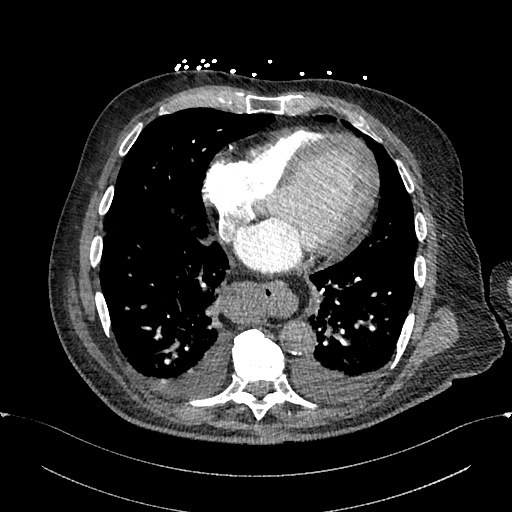
[im 184/414  lung]
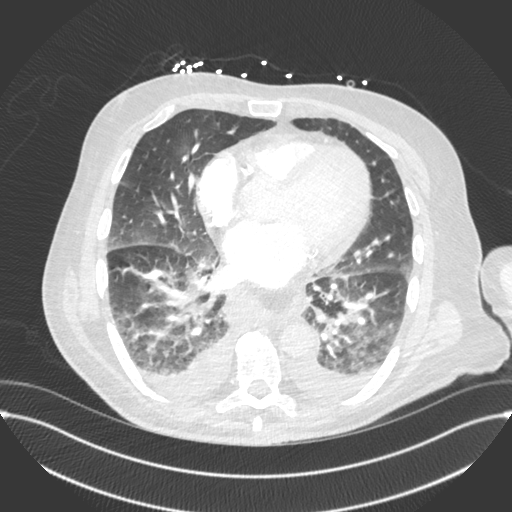
[im 230/414  soft-tissue]
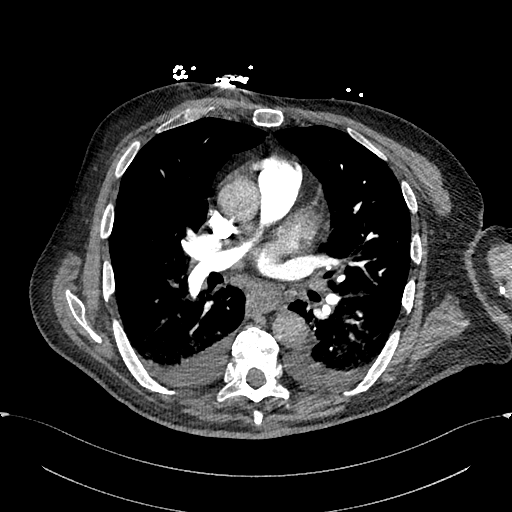
[im 253/414  lung]
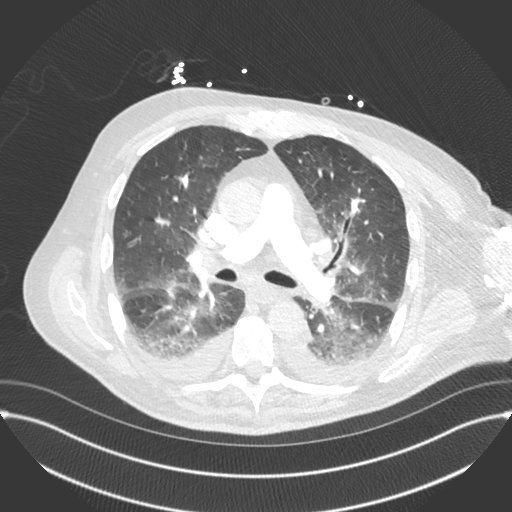
[im 276/414  soft-tissue]
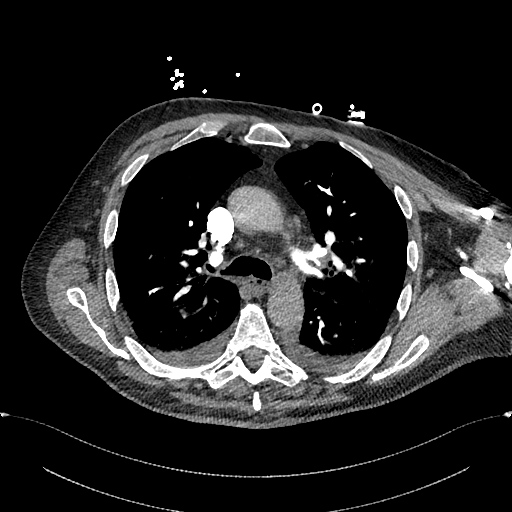
[im 299/414  lung]
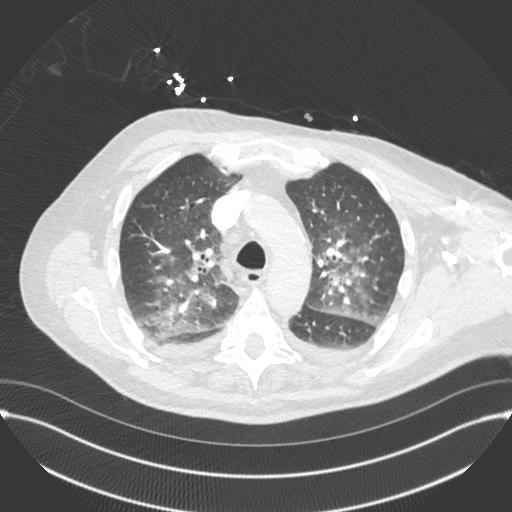
[im 322/414  soft-tissue]
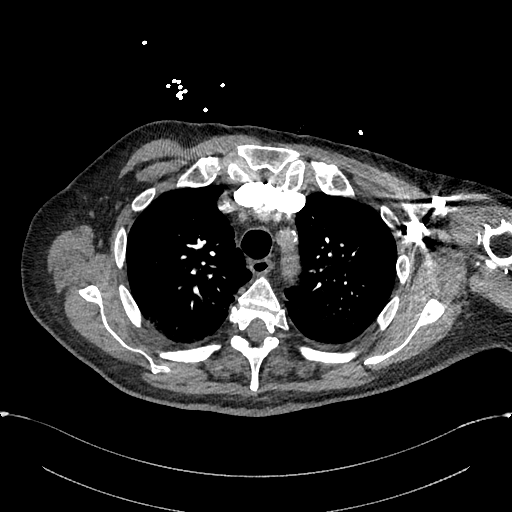
[im 368/414  lung]
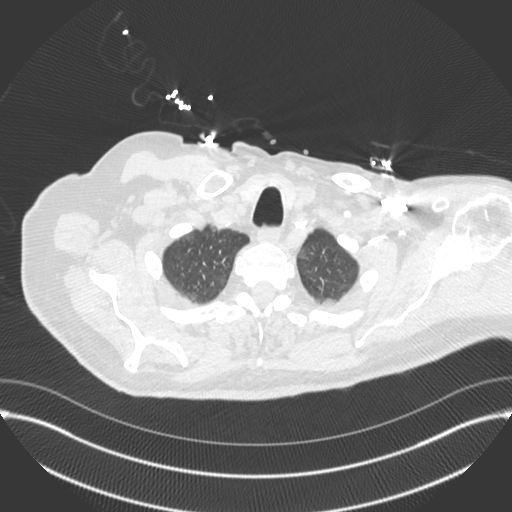
[im 391/414  soft-tissue]
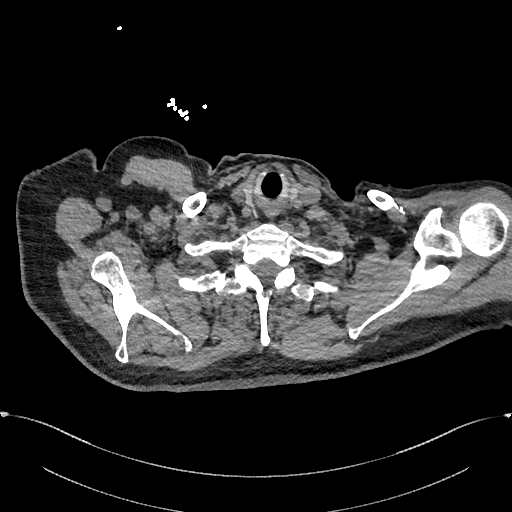

[Series 8: cor · coronal · 0.59mm/px · 3 of 163 slices shown]
[im 41/163  soft-tissue]
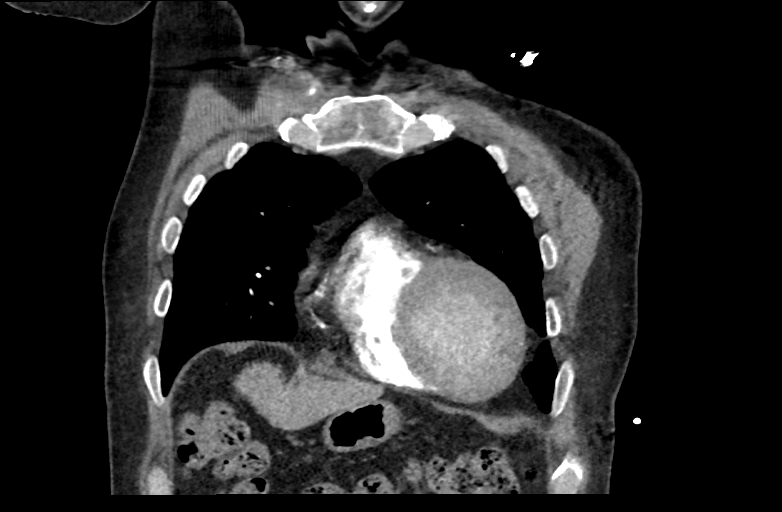
[im 82/163  soft-tissue]
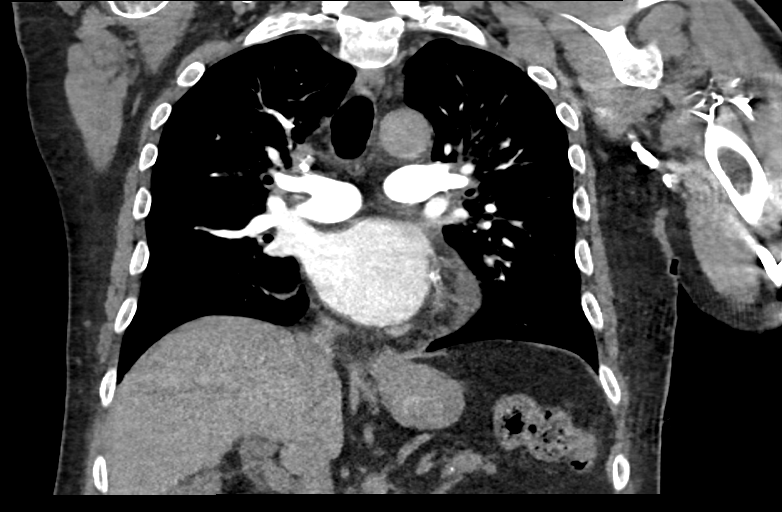
[im 122/163  soft-tissue]
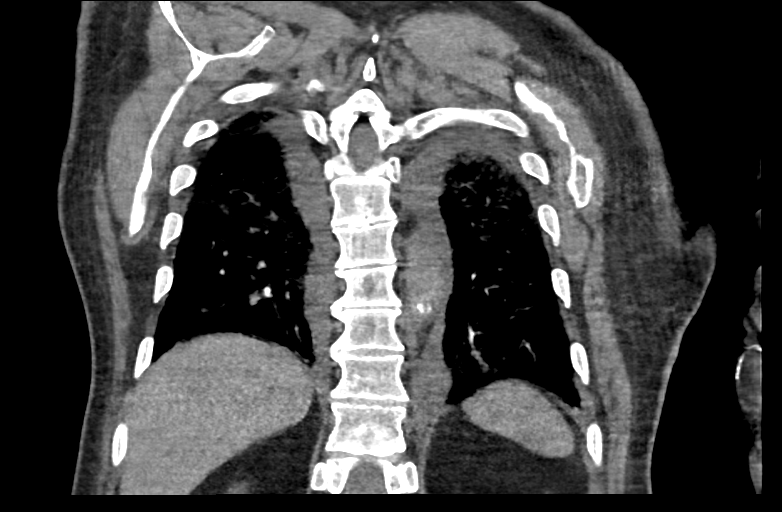

[17 of 46 positions shown; findings below may reference images not displayed]

FINDINGS: Cardiovascular: There is adequate opacification of the pulmonary
arterial tree. There is no intraluminal filling defect identified to
suggest acute pulmonary embolism. The central pulmonary arteries are
of normal caliber.

There is extensive multi-vessel coronary artery calcification.
Cardiac size is mildly enlarged with left ventricular dilation
noted. Trace pericardial effusion. Mild atherosclerotic
calcification is seen within the thoracic aorta.

Mediastinum/Nodes: Thyroid unremarkable. No pathologic thoracic
adenopathy. Moderate hiatal hernia. Fluid within the distal
esophagus may reflect changes of gastroesophageal reflux.

Lungs/Pleura: There is is symmetric bilateral interstitial and
airspace infiltrate most in keeping with moderate pulmonary edema.
Small bilateral pleural effusions are present. No pneumothorax.
Central airways are widely patent.

Upper Abdomen: Scattered hepatic hypodensities within the right
hepatic lobe may represent tiny hepatic cysts, but are not well
characterized on this examination. No acute abnormality within the
visualized abdomen.

Musculoskeletal: The osseous structures are age-appropriate. No
acute bone abnormality

Review of the MIP images confirms the above findings.
IMPRESSION: No acute pulmonary embolism.

Extensive coronary artery calcification.  Left ventricular dilation.

Moderate cardiogenic failure.

Moderate hiatal hernia with probable gastroesophageal reflux.

Aortic Atherosclerosis ([MT]-[MT]).

## 2020-03-21 MED ORDER — ASPIRIN EC 81 MG PO TBEC
81.0000 mg | DELAYED_RELEASE_TABLET | Freq: Every day | ORAL | Status: DC
  Administered 2020-03-22 – 2020-03-24 (×3): 81 mg via ORAL
  Filled 2020-03-21 (×2): qty 1

## 2020-03-21 MED ORDER — INSULIN ASPART 100 UNIT/ML ~~LOC~~ SOLN
0.0000 [IU] | Freq: Three times a day (TID) | SUBCUTANEOUS | Status: DC
  Administered 2020-03-21 – 2020-03-22 (×2): 3 [IU] via SUBCUTANEOUS
  Administered 2020-03-22: 2 [IU] via SUBCUTANEOUS
  Administered 2020-03-24: 5 [IU] via SUBCUTANEOUS
  Administered 2020-03-24 (×2): 3 [IU] via SUBCUTANEOUS

## 2020-03-21 MED ORDER — IOHEXOL 350 MG/ML SOLN
70.0000 mL | Freq: Once | INTRAVENOUS | Status: AC | PRN
  Administered 2020-03-21: 70 mL via INTRAVENOUS

## 2020-03-21 MED ORDER — FUROSEMIDE 10 MG/ML IJ SOLN
40.0000 mg | Freq: Once | INTRAMUSCULAR | Status: AC
  Administered 2020-03-21: 40 mg via INTRAVENOUS
  Filled 2020-03-21: qty 4

## 2020-03-21 MED ORDER — FUROSEMIDE 10 MG/ML IJ SOLN
80.0000 mg | Freq: Once | INTRAMUSCULAR | Status: AC
  Administered 2020-03-21: 80 mg via INTRAVENOUS
  Filled 2020-03-21: qty 8

## 2020-03-21 MED ORDER — PANTOPRAZOLE SODIUM 40 MG PO TBEC
40.0000 mg | DELAYED_RELEASE_TABLET | Freq: Every day | ORAL | Status: DC
  Administered 2020-03-21 – 2020-03-24 (×4): 40 mg via ORAL
  Filled 2020-03-21 (×4): qty 1

## 2020-03-21 MED ORDER — BENZONATATE 100 MG PO CAPS
100.0000 mg | ORAL_CAPSULE | Freq: Three times a day (TID) | ORAL | Status: DC | PRN
  Administered 2020-03-21 – 2020-03-24 (×3): 100 mg via ORAL
  Filled 2020-03-21 (×3): qty 1

## 2020-03-21 MED ORDER — CARVEDILOL 6.25 MG PO TABS
6.2500 mg | ORAL_TABLET | Freq: Two times a day (BID) | ORAL | Status: DC
  Administered 2020-03-21 – 2020-03-22 (×3): 6.25 mg via ORAL
  Filled 2020-03-21 (×3): qty 1

## 2020-03-21 MED ORDER — PRAVASTATIN SODIUM 40 MG PO TABS: 80.0000 mg | ORAL_TABLET | Freq: Every day | ORAL | Status: DC

## 2020-03-21 MED ORDER — HEPARIN BOLUS VIA INFUSION
4000.0000 [IU] | Freq: Once | INTRAVENOUS | Status: AC
  Administered 2020-03-21: 4000 [IU] via INTRAVENOUS
  Filled 2020-03-21: qty 4000

## 2020-03-21 MED ORDER — ATORVASTATIN CALCIUM 40 MG PO TABS
40.0000 mg | ORAL_TABLET | Freq: Every day | ORAL | Status: DC
  Administered 2020-03-21 – 2020-04-01 (×12): 40 mg via ORAL
  Filled 2020-03-21 (×12): qty 1

## 2020-03-21 MED ORDER — WHITE PETROLATUM EX OINT
TOPICAL_OINTMENT | CUTANEOUS | Status: AC
  Filled 2020-03-21: qty 28.35

## 2020-03-21 MED ORDER — APOAEQUORIN 10 MG PO CAPS: ORAL_CAPSULE | Freq: Every day | ORAL | Status: DC

## 2020-03-21 MED ORDER — ASPIRIN 325 MG PO TABS: 325.0000 mg | ORAL_TABLET | Freq: Every day | ORAL | Status: DC

## 2020-03-21 MED ORDER — HEPARIN (PORCINE) 25000 UT/250ML-% IV SOLN
1550.0000 [IU]/h | INTRAVENOUS | Status: DC
  Administered 2020-03-21: 1200 [IU]/h via INTRAVENOUS
  Administered 2020-03-21: 1550 [IU]/h via INTRAVENOUS
  Filled 2020-03-21 (×4): qty 250

## 2020-03-21 MED ORDER — LISINOPRIL 10 MG PO TABS
10.0000 mg | ORAL_TABLET | Freq: Every day | ORAL | Status: DC
  Administered 2020-03-21 – 2020-03-22 (×2): 10 mg via ORAL
  Filled 2020-03-21 (×2): qty 1

## 2020-03-21 MED ORDER — INSULIN ASPART 100 UNIT/ML ~~LOC~~ SOLN
0.0000 [IU] | Freq: Every day | SUBCUTANEOUS | Status: DC
  Administered 2020-03-23: 2 [IU] via SUBCUTANEOUS

## 2020-03-21 MED ORDER — VITAMIN B-12 1000 MCG PO TABS
1000.0000 ug | ORAL_TABLET | Freq: Every day | ORAL | Status: DC
  Administered 2020-03-21 – 2020-04-02 (×11): 1000 ug via ORAL
  Filled 2020-03-21 (×12): qty 1

## 2020-03-21 MED ORDER — TAMSULOSIN HCL 0.4 MG PO CAPS
0.4000 mg | ORAL_CAPSULE | Freq: Every day | ORAL | Status: DC
  Administered 2020-03-21 – 2020-04-01 (×12): 0.4 mg via ORAL
  Filled 2020-03-21 (×12): qty 1

## 2020-03-21 MED ORDER — VITAMIN D 25 MCG (1000 UNIT) PO TABS
1000.0000 [IU] | ORAL_TABLET | Freq: Every evening | ORAL | Status: DC
  Administered 2020-03-21 – 2020-04-01 (×11): 1000 [IU] via ORAL
  Filled 2020-03-21 (×12): qty 1

## 2020-03-21 MED ORDER — ASPIRIN 325 MG PO TABS
325.0000 mg | ORAL_TABLET | Freq: Every day | ORAL | Status: AC
  Administered 2020-03-21: 325 mg via ORAL
  Filled 2020-03-21: qty 1

## 2020-03-21 NOTE — Progress Notes (Signed)
ANTICOAGULATION CONSULT NOTE - Initial Consult  Pharmacy Consult for Heparin  Indication: chest pain/ACS  No Known Allergies    Vital Signs: Temp: 98.2 F (36.8 C) (02/26 0015) BP: 108/78 (02/26 0215) Pulse Rate: 101 (02/26 0215)  Labs: Recent Labs    03/21/20 0037  HGB 12.3*  HCT 37.6*  PLT 382  CREATININE 0.95  TROPONINIHS 551*    Estimated Creatinine Clearance: 74.8 mL/min (by C-G formula based on SCr of 0.95 mg/dL).   Medical History: Past Medical History:  Diagnosis Date  . Allergic rhinitis   . Arthritis    wrists  . Cardiomyopathy, nonischemic [facility])    followed by cardiology--- dr [name]---  2016 ef 45-50% ,  2016 nuclear ef 37%,  2017 per echo ef 50-55%  . GERD (gastroesophageal reflux disease)   . Hiatal hernia   . History of kidney stones   . 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 History of syncope (03-06-2020 pt stated has not had sycopal episode in few years, stated it seems to happen in extreme hot conditions)   cardiologist--- dr [name]--- dx recurrent syncope;  nuclear study 11-03-2014 intermediate risk w/ no ischemia, apical hypokinesis, nuclear ef 37%;  event monitor-- 12-21-2015 SB/ ST  no pauses/ arrythmia's;  echo 08-03-2015 ef 50-55%  . Hyperlipidemia   . Hypertension    followed by pcp  . Hypovitaminosis D   . Mass of left testicle   . Nocturia   . Plantar fasciitis   . Presence of surgical incision    01/ 2022  moh's w/ skin graft of nose, per pt still healing and wear bandage daily  . Type 2 diabetes mellitus ([place])    pt is adament that he is not and have been told he is a diabetic but a borderline;  followed by pcp, in pcp note states DM2 and takes 2 meds daily  . Wears glasses   . Wears hearing aid in both ears     Assessment: 75 y/o M with shortness of breath/epigastric burning. CT Angio negative for PE. Troponin is elevated. Starting heparin.  PTA meds reviewed. Hgb 12.3. Renal function ok.   Goal of Therapy:  Heparin level 0.3-0.7 units/ml Monitor platelets by anticoagulation protocol: Yes   Plan:  Heparin 4000 units BOLUS Start heparin drip at 1200 units/hr 1200 Heparin level Daily CBC/Heparin level Monitor for bleeding  Narda Bonds, PharmD, BCPS Clinical Pharmacist Phone: [phone]

## 2020-03-21 NOTE — H&P (Addendum)
CARDIOLOGY ADMISSION NOTE  Patient ID: Roy [name] MRN: [id number] DOB/AGE: [birth year] 77 y.o.  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 H/o recurrent syncope (in the past)- unclear etiology H/o cardiomyopathy 2016 (EF 45-50%) Coronary calcifications and cardiomegaly   Plan: - admit to cardiac telemetry, cardiology service - trend trops, ekg. ER already started the patient on heparin gtt given indigestion and troponin elevation (551-> 773). EKG there is mild ST depression in V2 and non specific changes in the lateral leads, there is LVH and sinus tachycardia as well but I do not think this is classic for posterior st elevation. Repeat EKG pending, The patient has no chest pain- for now, continue heparin gtt. S/p aspirin 325mg   - for CHF exacerbation: get ECHO in am, continue aspirin 81mg , coreg 6.25mg  BID, IV lasix 40mg  BID. Currently on 2lts oxygen, wean as tolerated - hold lisinopril. If EF is down then switch to [facility] after 36 hours wash out (last dose was 2/25) -  Keep him NPO tentatively if rising trop or EKG changes then we will do LHC/CA this am, otherwise on Monday morning - insulin sliding scale for DM-2. Continue pravastatin 80mg  daily. Lipids and A1c pending. 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 When EMS found him- he was tachycardic, tachypneic, hypoxic 82% and was placed on non rebreather and finally down to 2lts.  Patient also had 1 episode of nausea with vomiting- pink frothy sputum.  ER course: EKG as noted below, no chest pain, hypoxic on 2lts, JVD+, crackles+, CXR and CTA chest shows vascular congestion, cardiomegaly, coronary calcifications. Trop 551->773, labs- unremarkable except wbc 16- no fevers, chills or covid symptoms. BP stable  Prior to this episode- he denies any chest pain, dyspnea, orthopnea, pnd or LE edema He was recently diagnosed with left testicular cancer (B cell lymphoma) s/p left inguinal orchiectomy on 03/11/20. He has not had any chemo or radiation or metastasis. Pending oncology appt next month. He also has sq cell cancer of skin s/p resection and skin graft of the nose.   EKG:  Sinus tachycardia, LVH,mild ST depression in V2 and non specific ST-T changes in the lateral leads  CXR: today IMPRESSION: Interstitial airspace opacities throughout both lungs which could be due to edema, inflammatory, and/or infectious etiology  CTA chest: 03/21/20 IMPRESSION: No acute pulmonary embolism.  Extensive coronary artery calcification.  Left ventricular dilation.  Moderate cardiogenic failure.  Moderate hiatal hernia with probable gastroesophageal reflux.  Aortic  Atherosclerosis (ICD10-I70.0).  Prior Cardiac work up: MYOVIEW: 11/03/2014  The left ventricular ejection fraction is moderately  decreased (30-44%).  Nuclear stress EF: 37%.  There was no ST segment deviation noted during stress.  This is an intermediate risk study.  No ischemia.  There is LV systolic dysfunction with apical hypokinesis.  MONITOR: 2017  Sinus bradycardia to sinus tachycardia.  NO pauses or arrhythmias.  ECHO: 08/03/2015 - Left ventricle: The cavity size was normal. Systolic function was  normal. The estimated ejection fraction was in the range of 50%  to 55%. There is akinesis of the apical septal myocardium. There  is akinesis of the apical myocardium. There is akinesis of the  midinferoseptal myocardium. There is akinesis of the  apicalinferolateral and inferior myocardium. There was an  increased relative contribution of atrial contraction to  ventricular filling. Doppler parameters are consistent with  abnormal left ventricular relaxation (grade 1 diastolic  dysfunction).  - Ventricular septum: Septal motion showed paradox. These changes  are consistent with intraventricular conduction delay.  - Aortic valve: Trileaflet; mildly thickened, mildly calcified  leaflets. Moderate focal thickening and calcification involving  the left coronary cusp.  - Mitral valve: There was mild regurgitation.  - Pulmonic valve: There was trivial regurgitation.   Past Medical History:  Diagnosis Date  . Allergic rhinitis   . Arthritis    wrists  . Cardiomyopathy, nonischemic [facility])    followed by cardiology--- dr [name]---  2016 ef 45-50% ,  2016 nuclear ef 37%,  2017 per echo ef 50-55%  . CHF (congestive heart failure), NYHA class III ([place]) 03/21/2020  . GERD (gastroesophageal reflux disease)   . Hiatal hernia   . History of kidney stones   . 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 History of syncope (03-06-2020 pt stated has not had sycopal episode in few years, stated it seems to happen  in extreme hot conditions)   cardiologist--- dr [name]--- dx recurrent syncope;  nuclear study 11-03-2014 intermediate risk w/ no ischemia, apical hypokinesis, nuclear ef 37%;  event monitor-- 12-21-2015 SB/ ST  no pauses/ arrythmia's;  echo 08-03-2015 ef 50-55%  . Hyperlipidemia   . Hypertension    followed by pcp  . Hypovitaminosis D   . Mass of left testicle   . Nocturia   . Plantar fasciitis   . Presence of surgical incision    01/ 2022  moh's w/ skin graft of nose, per pt still healing and wear bandage daily  . Type 2 diabetes mellitus ([place])    pt is adament that he is not and have been told he is a diabetic but a borderline;  followed by pcp, in pcp note states DM2 and takes 2 meds daily  . Wears glasses   . Wears hearing aid in both ears     Past Surgical History:  Procedure Laterality Date  . COLONOSCOPY  last one 01-30-2017  . LOW ANTERIOR RESECTION RECTUM W/ COLOPROCTOSTOMY  05/2003  . MOHS SURGERY  01/2020   nose w/ graft  . ORCHIECTOMY Left 03/11/2020   Procedure: [place] [facility];  Surgeon: [name], MD;  Location: [facility];  Service: Urology;  Laterality: Left;  ONLY NEEDS 60 MIN  . SHOULDER SURGERY Right 1992; 07/ 2021  . 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  TONSILLECTOMY AND ADENOIDECTOMY  child  . UPPER GASTROINTESTINAL ENDOSCOPY  last one 06-06-2017    No Known Allergies No current facility-administered medications on file prior to encounter.   Current Outpatient Medications on File Prior to Encounter  Medication Sig Dispense Refill  . amLODipine (NORVASC) 10 MG tablet Take 1 tablet (10 mg total) by mouth daily. (Patient taking differently: Take 10 mg by mouth at bedtime.) 90 tablet 1  . Apoaequorin (PREVAGEN PO) Take by mouth at bedtime.    . Artificial Tear Ointment (DRY EYES OP) Apply to eye as needed.    [name] aspirin 81 MG tablet as directed. Take 1 tablet by mouth 5  times weekly.    . carvedilol (COREG) 6.25 MG tablet Take 1 tablet (6.25 mg total) by mouth 2 (two) times daily. 180 tablet 1  . cholecalciferol (VITAMIN D) 1000 UNITS tablet Take 1,000 Units by mouth every evening. Vit D3  2000 IU-Take a pill daily    . diflorasone (PSORCON) 0.05 % ointment Apply topically daily. For 14 days    . glucosamine-chondroitin 500-400 MG tablet Take 1 tablet by mouth. 5 times weekly    . HYDROcodone-acetaminophen (NORCO) 5-325 MG tablet Take 1 tablet by mouth every 4 (four) hours as needed for moderate pain. 20 tablet 0  . lisinopril (ZESTRIL) 40 MG tablet Take 1 tablet (40 mg total) by mouth daily. (Patient taking differently: Take 40 mg by mouth at bedtime.) 90 tablet 1  . metFORMIN (GLUCOPHAGE) 1000 MG tablet Take 1,000 mg by mouth 2 (two) times daily with a meal.     . Multiple Vitamin (MULTIVITAMIN WITH MINERALS) TABS tablet Take 1 tablet by mouth. 5 times weekly    . omeprazole (PRILOSEC) 20 MG capsule Take 20 mg by mouth daily.    . pioglitazone (ACTOS) 30 MG tablet Take 30 mg by mouth daily.    . pravastatin (PRAVACHOL) 80 MG tablet Take 1 tablet (80 mg total) by mouth daily. (Patient taking differently: Take 80 mg by mouth at bedtime.) 90 tablet 1  . tamsulosin (FLOMAX) 0.4 MG CAPS capsule Take 0.4 mg by mouth at bedtime.    [name] UNABLE TO FIND Take 1 tablet by mouth 2 (two) times a day. Study cholesterol drug if drug name needed please call April McCaskill at [phone]    . vitamin B-12 (CYANOCOBALAMIN) 1000 MCG tablet Take 1,000 mcg by mouth. 5 times a week     Social History   Socioeconomic History  . Marital status: Married    Spouse name: Not on file  . Number of children: Not on file  . Years of education: Not on file  . Highest education level: Not on file  Occupational History  . Not on file  Tobacco Use  . Smoking status: Former Smoker    Years: 5.00    Types: Pipe, Cigars    Quit date: 11/29/1976    Years since quitting: 43.3  . Smokeless  tobacco: Never Used  Vaping Use  . Vaping Use: Never used  Substance and Sexual Activity  . Alcohol use: Yes    Alcohol/week: 0.0 standard drinks    Comment: Occasional drink   . Drug use: Never  . Sexual activity: Not on file  Other Topics Concern  . Not on file  Social History Narrative  . Not on file   Social Determinants of Health   Financial Resource Strain: Not on file  Food Insecurity: Not on file  Transportation Needs: Not on file  Physical  Activity: Not on file  Stress: Not on file  Social Connections: Not on file  Intimate Partner Violence: Not on file    Family History  Problem Relation Age of Onset  . Heart attack Mother   . Stroke Father   . Colon cancer Neg Hx   . Esophageal cancer Neg Hx   . Pancreatic cancer Neg Hx   . Rectal cancer Neg Hx   . Stomach cancer Neg Hx   . Colon polyps Neg Hx      Review of Systems: [y] = yes, [ ]  = no       General: Weight gain [] ; Weight loss [ ] ; Anorexia [ ] ; Fatigue [ ] ; Fever [ ] ; Chills [ ] ; Weakness [ ]     Cardiac: Chest pain/pressure [ ] ; Resting SOB [x ]; Exertional SOB [x ]; Orthopnea [ ] ; Pedal Edema [ ] ; Palpitations [ ] ; Syncope [ ] ; Presyncope [ ] ; Paroxysmal nocturnal dyspnea[ ]     Pulmonary: Cough [x ]; Wheezing[x ]; Hemoptysis[ ] ; Sputum [x ]; Snoring [ ]     GI: Vomiting[ ] ; Dysphagia[ ] ; Melena[ ] ; Hematochezia [ ] ; Heartburn[ ] ; Abdominal pain [ ] ; Constipation [ ] ; Diarrhea [ ] ; BRBPR [ ]     GU: Hematuria[ ] ; Dysuria [ ] ; Nocturia[ ]   Vascular: Pain in legs with walking [ ] ; Pain in feet with lying flat [ ] ; Non-healing sores [ ] ; Stroke [ ] ; TIA [ ] ; Slurred speech [ ] ;    Neuro: Headaches[ ] ; Vertigo[ ] ; Seizures[ ] ; Paresthesias[ ] ;Blurred vision [ ] ; Diplopia [ ] ; Vision changes [ ]     Ortho/Skin: Arthritis [ ] ; Joint pain [ ] ; Muscle pain [ ] ; Joint swelling [ ] ; Back Pain [ ] ; Rash [ ]     Psych: Depression[ ] ; Anxiety[ ]     Heme: Bleeding problems [ ] ; Clotting disorders [ ] ; Anemia [ ]      Endocrine: Diabetes [ ] ; Thyroid dysfunction[ ]   Physical Exam: Blood pressure 119/90, pulse (!) 111, temperature 98.2 F (36.8 C), temperature source Oral, resp. rate (!) 33, height 6\' 1"  (1.854 m), weight 96.2 kg, SpO2 94 %.   GENERAL: Patient is afebrile, Vital signs reviewed, Well appearing, Patient appears comfortable, Alert and lucid. EYES: Normal inspection. HEENT:  normocephalic, atraumatic , normal ENT inspection. ORAL:  Moist NECK:  supple , normal inspection. CARD:  Tachycardic+, heart sounds normal., JVD upper neck RESP: tachypneic, b/l crackles+ ABD: soft, tender to palpation , BS present, soft, no organomegaly or masses . BACK: non-tender. No CVA tenderness. MUSC:  normal ROM, non-tender , 2+ pedal edema . SKIN: color normal, no rash, warm, dry . NEURO: awake & alert, lucid, no motor/sensory deficit. Gait stable. PSYCH: mood/affect normal.   Labs: Lab Results  Component Value Date   BUN 17 03/21/2020   Lab Results  Component Value Date   CREATININE 0.95 03/21/2020   Lab Results  Component Value Date   NA 139 03/21/2020   K 4.1 03/21/2020   CL 108 03/21/2020   CO2 22 03/21/2020   Lab Results  Component Value Date   TROPONINI <0.03 09/27/2015   Lab Results  Component Value Date   WBC 16.6 (H) 03/21/2020   HGB 12.3 (L) 03/21/2020   HCT 37.6 (L) 03/21/2020   MCV 92.8 03/21/2020   PLT 382 03/21/2020   No results found for: CHOL, HDL, LDLCALC, LDLDIRECT, TRIG, CHOLHDL Lab Results  Component Value Date   ALT 12 03/21/2020   AST 21 03/21/2020  ALKPHOS 59 03/21/2020   BILITOT 0.8 03/21/2020      Radiology:  CXR:  As above    Signed: Renae Fickle 03/21/2020, 6:25 AM

## 2020-03-21 NOTE — ED Triage Notes (Signed)
Assume care from [facility] where they report pt was laying down and felt short of breath. EMS reports pt was 82% on room air and was place on an non re breather with improvement of O2 SATS TO 96%. EMS report pt spit up "pink frosty sputum on scene.   Upon arrival pt O2 sats decrease to 88% on room air place on 2L [place]   HR 126 BO 142/85

## 2020-03-21 NOTE — Progress Notes (Signed)
ANTICOAGULATION CONSULT NOTE - Initial Consult  Pharmacy Consult for Heparin  Indication: chest pain/ACS  No Known Allergies  Vital Signs: Temp: 98.2 F (36.8 C) (02/26 1133) Temp Source: Oral (02/26 1133) BP: 101/72 (02/26 1115) Pulse Rate: 105 (02/26 1133)  Heparin dosing weight: 96.2 kg  Labs: Recent Labs    03/21/20 0037 03/21/20 0326 03/21/20 0711 03/21/20 1145  HGB 12.3*  --   --   --   HCT 37.6*  --   --   --   PLT 382  --   --   --   HEPARINUNFRC  --   --   --  0.17*  CREATININE 0.95  --   --   --   TROPONINIHS 551* 773* 1,257*  --     Estimated Creatinine Clearance: 80.8 mL/min (by C-G formula based on SCr of 0.95 mg/dL).   Medical History: Past Medical History:  Diagnosis Date  . Allergic rhinitis   . Arthritis    wrists  . Cardiomyopathy, nonischemic [facility])    followed by cardiology--- dr [name]---  2016 ef 45-50% ,  2016 nuclear ef 37%,  2017 per echo ef 50-55%  . CHF (congestive heart failure), NYHA class III ([place]) 03/21/2020  . GERD (gastroesophageal reflux disease)   . Hiatal hernia   . History of kidney stones   . 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 History of syncope (03-06-2020 pt stated has not had sycopal episode in few years, stated it seems to happen in extreme hot conditions)   cardiologist--- dr [name]--- dx recurrent syncope;  nuclear study 11-03-2014 intermediate risk w/ no ischemia, apical hypokinesis, nuclear ef 37%;  event monitor-- 12-21-2015 SB/ ST  no pauses/ arrythmia's;  echo 08-03-2015 ef 50-55%  . Hyperlipidemia   . Hypertension    followed by pcp  . Hypovitaminosis D   . Mass of left testicle   . Nocturia   . Plantar fasciitis   . Presence of surgical incision    01/ 2022  moh's w/ skin graft of nose, per pt still healing and wear bandage daily  . Type 2 diabetes mellitus ([place])    pt is adament that he is not and have been told he is a  diabetic but a borderline;  followed by pcp, in pcp note states DM2 and takes 2 meds daily  . Wears glasses   . Wears hearing aid in both ears     Assessment: 75 y/o M with shortness of breath/epigastric burning. CT Angio negative for PE. Troponin is elevated. Starting heparin. PTA meds reviewed. Hgb 12.3. Renal function ok.   Initial heparin level is subtherapeutic at 0.17.   Goal of Therapy:  Heparin level 0.3-0.7 units/ml Monitor platelets by anticoagulation protocol: Yes   Plan:  Increase heparin drip to 1550 units/hr 8h heparin level Daily CBC/Heparin level Monitor for bleeding F/u cardiology plan  Rebbeca Paul, PharmD PGY1 Pharmacy Resident 03/21/2020 12:46 PM  Please check [email or web address] for unit-specific pharmacy phone numbers.

## 2020-03-21 NOTE — ED Notes (Signed)
Notified MD ([place]ner)  trop 551

## 2020-03-21 NOTE — Progress Notes (Signed)
ANTICOAGULATION CONSULT NOTE - Follow Up Consult  Pharmacy Consult for Heparin  Indication: chest pain/ACS  No Known Allergies  Vital Signs: Temp: 98.8 F (37.1 C) (02/26 1937) Temp Source: Oral (02/26 1937) BP: 121/81 (02/26 1937) Pulse Rate: 99 (02/26 1937)  Heparin dosing weight: 96.2 kg  Labs: Recent Labs    03/21/20 0037 03/21/20 0326 03/21/20 0711 03/21/20 1145 03/21/20 1708 03/21/20 1840  HGB 12.3*  --   --   --   --   --   HCT 37.6*  --   --   --   --   --   PLT 382  --   --   --   --   --   HEPARINUNFRC  --   --   --  0.17*  --  0.33  CREATININE 0.95  --   --   --   --   --   TROPONINIHS 551*   < > 1,257*  --  1,389* 1,415*   < > = values in this interval not displayed.    Estimated Creatinine Clearance: 80.8 mL/min (by C-G formula based on SCr of 0.95 mg/dL).   Medical History: Past Medical History:  Diagnosis Date  . Allergic rhinitis   . Arthritis    wrists  . Cardiomyopathy, nonischemic [facility])    followed by cardiology--- dr [name]---  2016 ef 45-50% ,  2016 nuclear ef 37%,  2017 per echo ef 50-55%  . CHF (congestive heart failure), NYHA class III ([place]) 03/21/2020  . GERD (gastroesophageal reflux disease)   . Hiatal hernia   . History of kidney stones   . 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 History of syncope (03-06-2020 pt stated has not had sycopal episode in few years, stated it seems to happen in extreme hot conditions)   cardiologist--- dr [name]--- dx recurrent syncope;  nuclear study 11-03-2014 intermediate risk w/ no ischemia, apical hypokinesis, nuclear ef 37%;  event monitor-- 12-21-2015 SB/ ST  no pauses/ arrythmia's;  echo 08-03-2015 ef 50-55%  . Hyperlipidemia   . Hypertension    followed by pcp  . Hypovitaminosis D   . Mass of left testicle   . Nocturia   . Plantar fasciitis   . Presence of surgical incision    01/ 2022  moh's w/ skin graft of  nose, per pt still healing and wear bandage daily  . Type 2 diabetes mellitus ([place])    pt is adament that he is not and have been told he is a diabetic but a borderline;  followed by pcp, in pcp note states DM2 and takes 2 meds daily  . Wears glasses   . Wears hearing aid in both ears     Assessment: 75 y/o M with shortness of breath/epigastric burning. CT Angio negative for PE. Troponin is elevated. Starting heparin. PTA meds reviewed. Hgb 12.3. Renal function ok.   Heparin drip 1550 uts/hr Heparin level 0.33 at goal.    Goal of Therapy:  Heparin level 0.3-0.7 units/ml Monitor platelets by anticoagulation protocol: Yes   Plan:  continue heparin drip  1550 units/hr Daily CBC/Heparin level Monitor for bleeding   Bonnita Nasuti Pharm.D. CPP, BCPS Clinical Pharmacist [phone] 03/21/2020 8:49 PM    Please check [email or web address] for unit-specific pharmacy phone numbers.

## 2020-03-21 NOTE — Progress Notes (Signed)
Asked to go over the plan with [name] and his wife.  Review of chart demonstrates that he was admitted with shortness of breath and acute heart failure.  Ejection fraction was within normal limits on most recent evaluation.  He had a stress test that was normal in the past.  His troponin values have trended up.  Findings are consistent with non-STEMI.  He is also volume up.  I discussed with him that we will continue to diurese him and continue heparin drip with plans for left heart catheterization Monday.  He denies any chest pain.  He is still short of breath.  We will give an additional 80 mg of IV Lasix tonight.  He seems to be doing well.  Of note his telemetry shows sinus tachycardia.  Per review of records from his primary cardiologist he has had a long history of sinus tachycardia.  He is on his home beta-blocker.  We will not aggressively treat this.   [place] Bells T. [name], MD, [place]  [address] [place], [place] [address] [phone]  5:04 PM

## 2020-03-21 NOTE — ED Notes (Signed)
Patient transported to CT 

## 2020-03-21 NOTE — ED Provider Notes (Signed)
[facility] EMERGENCY DEPARTMENT Provider Note   CSN: [id number] Arrival date & time: 03/21/20  0002     History Chief Complaint  Patient presents with  . Shortness of Breath    Roy [name] is a 75 y.o. male.  75 year old male who presents to emergency department today with acute onset of dyspnea.  Patient states he had indigestion throughout the day.  Has a history of indigestion.  He states he misses medicines he has some burning in the epigastric area and this is what he had today.  He states it was no different than that.  He thinks the medicine seemed to make it better.  However tonight when he went to go to bed he laid down on the bed and instantly became very dyspneic.  Patient sat up but it seemed to help and in fact may have made a bit worse.  Patient then ambulated out to his recliner which did not help when sit in it.  He states what kind of felt as when he leaned on the back of the recliner while standing behind it.  Patient without any chest pain at this time but still short of breath.  States he spit up a lot of red thick sputum.  No recent fevers or cough.  Is vaccinated for Covid.  No leg swelling.  No arm swelling.  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  No history of anything similar to this.  Has a history of high blood pressure was been pretty well controlled recently.  No coronary artery disease history that he knows of.  EMS was called patient is 82% on room air so I put him on a nonrebreather.  Patient was 100% so they wean him down to a few liters.  At 2 L he was still at 81.  Patient is on 4 L now satting 92 and feels little bit better.   Shortness of Breath      Past Medical History:  Diagnosis Date  . Allergic rhinitis   . Arthritis    wrists  . Cardiomyopathy, nonischemic [facility])    followed by  cardiology--- dr [name]---  2016 ef 45-50% ,  2016 nuclear ef 37%,  2017 per echo ef 50-55%  . GERD (gastroesophageal reflux disease)   . Hiatal hernia   . History of kidney stones   . 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 History of syncope (03-06-2020 pt stated has not had sycopal episode in few years, stated it seems to happen in extreme hot conditions)   cardiologist--- dr [name]--- dx recurrent syncope;  nuclear study 11-03-2014 intermediate risk w/ no ischemia, apical hypokinesis, nuclear ef 37%;  event monitor-- 12-21-2015 SB/ ST  no pauses/ arrythmia's;  echo 08-03-2015 ef 50-55%  . Hyperlipidemia   . Hypertension    followed by pcp  . Hypovitaminosis D   . Mass of left testicle   . Nocturia   . Plantar fasciitis   . Presence of surgical incision    01/ 2022  moh's w/ skin graft of nose, per pt still healing and wear bandage daily  . Type 2 diabetes mellitus ([place])    pt is adament that he is not and  have been told he is a diabetic but a borderline;  followed by pcp, in pcp note states DM2 and takes 2 meds daily  . Wears glasses   . Wears hearing aid in both ears     Patient Active Problem List   Diagnosis Date Noted  . Hyperlipidemia 01/30/2015  . Nonischemic cardiomyopathy ([place]) 01/30/2015  . Essential hypertension 01/30/2015  . Faintness 01/30/2015  . Chest pain 10/24/2014  . Cardiomyopathy ([place]) 10/24/2014  . Allergic rhinitis   . Syncope   . Nephrolithiasis   . Plantar fasciitis   . Hypovitaminosis D     Past Surgical History:  Procedure Laterality Date  . COLONOSCOPY  last one 01-30-2017  . LOW ANTERIOR RESECTION RECTUM W/ COLOPROCTOSTOMY  05/2003  . MOHS SURGERY  01/2020   nose w/ graft  . ORCHIECTOMY Left 03/11/2020   Procedure: [place];  Surgeon: [name], MD;  Location: [facility];  Service: Urology;  Laterality: Left;  ONLY  NEEDS 60 MIN  . SHOULDER SURGERY Right 1992; 07/ 2021  . 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 TONSILLECTOMY AND ADENOIDECTOMY  child  . UPPER GASTROINTESTINAL ENDOSCOPY  last one 06-06-2017       Family History  Problem Relation Age of Onset  . Heart attack Mother   . Stroke Father   . Colon cancer Neg Hx   . Esophageal cancer Neg Hx   . Pancreatic cancer Neg Hx   . Rectal cancer Neg Hx   . Stomach cancer Neg Hx   . Colon polyps Neg Hx     Social History   Tobacco Use  . Smoking status: Former Smoker    Years: 5.00    Types: Pipe, Cigars    Quit date: 11/29/1976    Years since quitting: 43.3  . Smokeless tobacco: Never Used  Vaping Use  . Vaping Use: Never used  Substance Use Topics  . Alcohol use: Yes    Alcohol/week: 0.0 standard drinks    Comment: Occasional drink   . Drug use: Never    Home Medications Prior to Admission medications   Medication Sig Start Date End Date Taking? Authorizing Provider  amLODipine (NORVASC) 10 MG tablet Take 1 tablet (10 mg total) by mouth daily. Patient taking differently: Take 10 mg by mouth at bedtime. 10/01/19   Dorothy Spark, MD  Apoaequorin (PREVAGEN PO) Take by mouth at bedtime.    [provider]  Artificial Tear Ointment (DRY EYES OP) Apply to eye as needed.    [provider]  aspirin 81 MG tablet as directed. Take 1 tablet by mouth 5 times weekly.    [provider]  carvedilol (COREG) 6.25 MG tablet Take 1 tablet (6.25 mg total) by mouth 2 (two) times daily. 10/01/19   Dorothy Spark, MD  cholecalciferol (VITAMIN D) 1000 UNITS tablet Take 1,000 Units by mouth every evening. Vit D3  2000 IU-Take a pill daily    [provider]  diflorasone (PSORCON) 0.05 % ointment Apply topically daily. For 14 days 02/24/20   [provider]  glucosamine-chondroitin 500-400 MG tablet Take 1 tablet by mouth. 5 times weekly    [provider]  HYDROcodone-acetaminophen (NORCO) 5-325 MG tablet Take 1 tablet by mouth every 4 (four) hours as needed for moderate pain. 03/11/20   [name], MD  lisinopril (ZESTRIL) 40 MG tablet Take 1 tablet (40  mg total) by mouth daily. Patient taking differently: Take 40 mg by mouth at bedtime. 10/01/19   Dorothy Spark, MD  metFORMIN (GLUCOPHAGE) 1000 MG tablet Take 1,000 mg by mouth 2 (two) times daily with a meal.     [provider]  Multiple Vitamin (MULTIVITAMIN WITH MINERALS) TABS tablet Take 1 tablet by mouth. 5 times weekly    [provider]  omeprazole (PRILOSEC) 20 MG capsule Take 20 mg by mouth daily.    [provider]  pioglitazone (ACTOS) 30 MG tablet Take 30 mg by mouth daily.    [provider]  pravastatin (PRAVACHOL) 80 MG tablet Take 1 tablet (80 mg total) by mouth daily. Patient taking differently: Take 80 mg by mouth at bedtime. 10/01/19   Dorothy Spark, MD  tamsulosin (FLOMAX) 0.4 MG CAPS capsule Take 0.4 mg by mouth at bedtime.    [provider]  UNABLE TO FIND Take 1 tablet by mouth 2 (two) times a day. Study cholesterol drug if drug name needed please call April McCaskill at [phone]    [provider]  vitamin B-12 (CYANOCOBALAMIN) 1000 MCG tablet Take 1,000 mcg by mouth. 5 times a week    [provider]    Allergies    Patient has no known allergies.  Review of Systems   Review of Systems  Respiratory: Positive for shortness of breath.   All other systems reviewed and are negative.   Physical Exam Updated Vital Signs BP (!) 123/91   Pulse (!) 121   Temp 98.2 F (36.8 C)   Resp (!) 26   SpO2 92%   Physical Exam Vitals and nursing note reviewed.  Constitutional:      Appearance: He is well-developed and well-nourished.  HENT:     Head: Normocephalic and atraumatic.  Cardiovascular:     Rate and Rhythm: Normal rate.  Pulmonary:     Effort: Pulmonary  effort is normal. No respiratory distress.     Breath sounds: Examination of the right-middle field reveals rales. Examination of the right-lower field reveals rales. Examination of the left-lower field reveals rales. Rales present.  Abdominal:     General: There is no distension.  Musculoskeletal:        General: Normal range of motion.     Cervical back: Normal range of motion.     Right lower leg: No tenderness. No edema.     Left lower leg: No tenderness. No edema.  Skin:    General: Skin is warm and dry.  Neurological:     General: No focal deficit present.     Mental Status: He is alert.     ED Results / Procedures / Treatments   Labs (all labs ordered are listed, but only abnormal results are displayed) Labs Reviewed  CBC WITH DIFFERENTIAL/PLATELET - Abnormal; Notable for the following components:      Result Value   WBC 16.6 (*)    RBC 4.05 (*)    Hemoglobin 12.3 (*)    HCT 37.6 (*)    Neutro Abs 14.2 (*)    All other components within normal limits  D-DIMER, QUANTITATIVE - Abnormal; Notable for the following components:   D-Dimer, Quant 0.92 (*)    All other components within normal limits  RESP PANEL BY RT-PCR (FLU A&B, COVID) [id number]  COMPREHENSIVE METABOLIC PANEL  BRAIN NATRIURETIC PEPTIDE  TROPONIN I (HIGH SENSITIVITY)    EKG EKG Interpretation  Date/Time:  Saturday March 21 2020 00:14:27 EST  Ventricular Rate:  124 PR Interval:    QRS Duration: 124 QT Interval:  332 QTC Calculation: 477 R Axis:   -35 Text Interpretation: Sinus tachycardia Ventricular premature complex LVH with IVCD and secondary repol abnrm Borderline prolonged QT interval faster rate, otherwise no significant change from 2017 Confirmed by Merrily Pew [phone]) on 03/21/2020 12:45:07 AM   Radiology DG Chest Portable 1 View  Result Date: 03/21/2020 CLINICAL DATA:  Shortness of breath EXAM: PORTABLE CHEST 1 VIEW COMPARISON:  None. FINDINGS: The heart size and mediastinal contours are  mildly enlarged. Aortic knob calcifications are seen. Diffusely increased hazy interstitial airspace opacity seen throughout both lungs. No acute osseous abnormality. IMPRESSION: Interstitial airspace opacities throughout both lungs which could be due to edema, inflammatory, and/or infectious etiology Electronically Signed   By: Prudencio Pair M.D.   On: 03/21/2020 00:57    Procedures .Critical Care Performed by: Merrily Pew, MD Authorized by: Merrily Pew, MD   Critical care provider statement:    Critical care time (minutes):  45   Critical care was necessary to treat or prevent imminent or life-threatening deterioration of the following conditions:  Respiratory failure   Critical care was time spent personally by me on the following activities:  Discussions with consultants, evaluation of patient's response to treatment, examination of patient, ordering and performing treatments and interventions, ordering and review of laboratory studies, ordering and review of radiographic studies, pulse oximetry, re-evaluation of patient's condition, obtaining history from patient or surrogate and review of old charts     Medications Ordered in ED Medications - No data to display  ED Course  I have reviewed the triage vital signs and the nursing notes.  Pertinent labs & imaging results that were available during my care of the patient were reviewed by me and considered in my medical decision making (see chart for details).    MDM Rules/Calculators/A&P                         Story sounds pretty concerning for acute pulmonary edema however he does not seem to be super hypertensive at this time.  He is tachypneic and tachycardic however bringing pulmonary embolus into the picture with his history of cancer as well.  We will start with x-ray and labs as above.  We will hold on any diuretics or other treatment until a more definitive diagnosis is obtained. Labs as above. Started on lasix. Started on  heparin, d/w cardiology who will admit.  All orders placed by cardiology aside from admit order. Temp admit orders placed.   Final Clinical Impression(s) / ED Diagnoses Final diagnoses:  Elevated troponin  Acute heart failure, unspecified heart failure type [facility])  NSTEMI (non-ST elevated myocardial infarction) ([place])  Acute respiratory failure with hypoxia [facility])    Rx / DC Orders ED Discharge Orders    None       Lu Paradise, Corene Cornea, MD 03/21/20 [phone]

## 2020-03-22 ENCOUNTER — Inpatient Hospital Stay (HOSPITAL_COMMUNITY): Payer: Medicare HMO

## 2020-03-22 ENCOUNTER — Encounter (HOSPITAL_COMMUNITY): Payer: Self-pay | Admitting: Cardiology

## 2020-03-22 DIAGNOSIS — I214 Non-ST elevation (NSTEMI) myocardial infarction: Principal | ICD-10-CM

## 2020-03-22 LAB — BASIC METABOLIC PANEL
Anion gap: 13 (ref 5–15)
Anion gap: 10 (ref 5–15)
BUN: 15 mg/dL (ref 8–23)
BUN: 18 mg/dL (ref 8–23)
CO2: 23 mmol/L (ref 22–32)
CO2: 23 mmol/L (ref 22–32)
Calcium: 9 mg/dL (ref 8.9–10.3)
Calcium: 8.8 mg/dL — ABNORMAL LOW (ref 8.9–10.3)
Chloride: 105 mmol/L (ref 98–111)
Chloride: 105 mmol/L (ref 98–111)
Creatinine, Ser: 1.04 mg/dL (ref 0.61–1.24)
Creatinine, Ser: 1.18 mg/dL (ref 0.61–1.24)
GFR, Estimated: >60 mL/min (ref >60)
GFR, Estimated: >60 mL/min (ref >60)
Glucose, Bld: 161 mg/dL — ABNORMAL HIGH (ref 70–99)
Glucose, Bld: 230 mg/dL — ABNORMAL HIGH (ref 70–99)
Potassium: 3 mmol/L — ABNORMAL LOW (ref 3.5–5.1)
Potassium: 3.7 mmol/L (ref 3.5–5.1)
Sodium: 141 mmol/L (ref 135–145)
Sodium: 138 mmol/L (ref 135–145)

## 2020-03-22 LAB — ECHOCARDIOGRAM COMPLETE
Area-P 1/2: 5.58 cm2
Height: 73 in
S' Lateral: 5.2 cm
Single Plane A4C EF: 18.6 %
Weight: 3392 oz

## 2020-03-22 LAB — PROCALCITONIN: Procalcitonin: <0.1 ng/mL

## 2020-03-22 LAB — GLUCOSE, CAPILLARY
Glucose-Capillary: 149 mg/dL — ABNORMAL HIGH (ref 70–99)
Glucose-Capillary: 147 mg/dL — ABNORMAL HIGH (ref 70–99)
Glucose-Capillary: 181 mg/dL — ABNORMAL HIGH (ref 70–99)
Glucose-Capillary: 160 mg/dL — ABNORMAL HIGH (ref 70–99)

## 2020-03-22 LAB — CBC
HCT: 31.5 % — ABNORMAL LOW (ref 39.0–52.0)
Hemoglobin: 10.7 g/dL — ABNORMAL LOW (ref 13.0–17.0)
MCH: 30.9 pg (ref 26.0–34.0)
MCHC: 34 g/dL (ref 30.0–36.0)
MCV: 91 fL (ref 80.0–100.0)
Platelets: 325 10*3/uL (ref 150–400)
RBC: 3.46 MIL/uL — ABNORMAL LOW (ref 4.22–5.81)
RDW: 13.9 % (ref 11.5–15.5)
WBC: 11.4 10*3/uL — ABNORMAL HIGH (ref 4.0–10.5)
nRBC: 0 % (ref 0.0–0.2)

## 2020-03-22 LAB — HEPARIN LEVEL (UNFRACTIONATED): Heparin Unfractionated: 0.49 IU/mL (ref 0.30–0.70)

## 2020-03-22 IMAGING — DX DG CHEST 1V PORT
1 series · 1 of 1 positions shown · non-contrast
Comparison: [DATE]

CLINICAL DATA: Shortness of breath.

EXAM:
PORTABLE CHEST 1 VIEW

[chest ap]
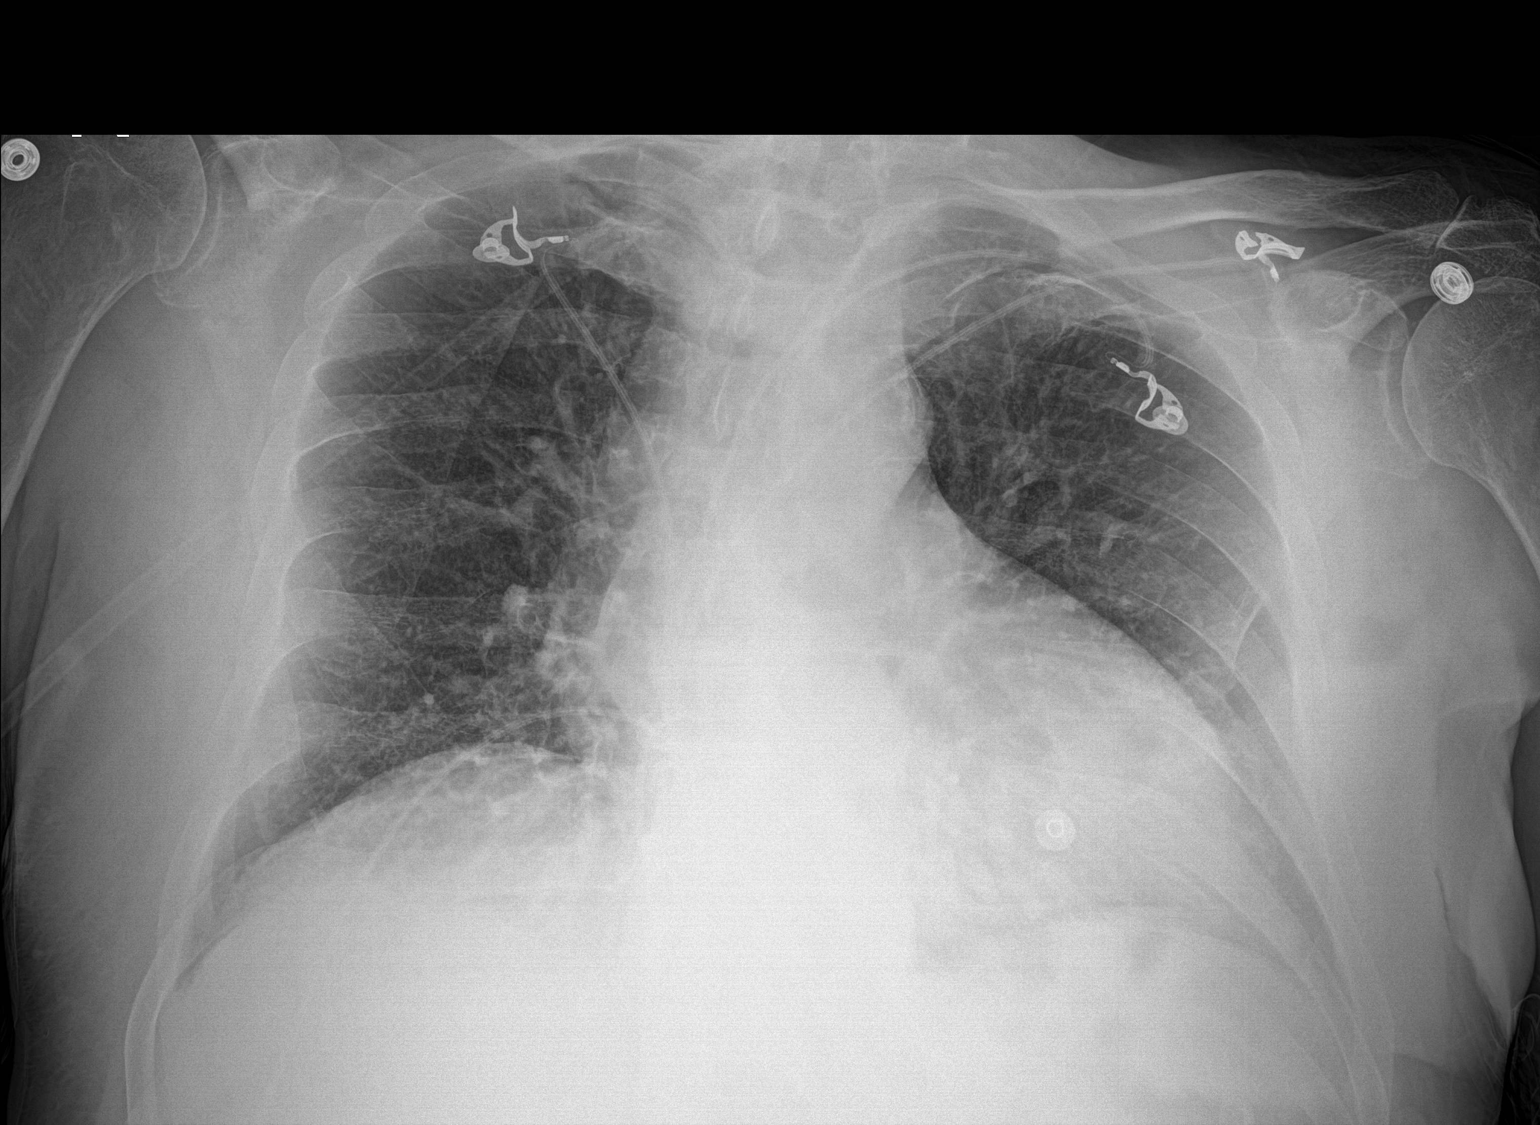

[1 of 1 positions shown; findings below may reference images not displayed]

FINDINGS: Hypoinflation without focal airspace consolidation or effusion.
Interval improvement of pulmonary vasculature compatible with near
resolution of interstitial edema. Cardiomediastinal silhouette and
remainder of the exam is unchanged.
IMPRESSION: Interval improvement of pulmonary vascular compatible with near
resolution of interstitial edema.

## 2020-03-22 MED ORDER — ASPIRIN 81 MG PO CHEW
81.0000 mg | CHEWABLE_TABLET | ORAL | Status: AC
  Administered 2020-03-23: 81 mg via ORAL
  Filled 2020-03-22: qty 1

## 2020-03-22 MED ORDER — SODIUM CHLORIDE 0.9% FLUSH: 3.0000 mL | INTRAVENOUS | Status: DC | PRN

## 2020-03-22 MED ORDER — SPIRONOLACTONE 12.5 MG HALF TABLET
12.5000 mg | ORAL_TABLET | Freq: Every day | ORAL | Status: DC
  Administered 2020-03-22 – 2020-03-24 (×3): 12.5 mg via ORAL
  Filled 2020-03-22 (×3): qty 1

## 2020-03-22 MED ORDER — SODIUM CHLORIDE 0.9% FLUSH
3.0000 mL | Freq: Two times a day (BID) | INTRAVENOUS | Status: DC
  Administered 2020-03-22 – 2020-04-02 (×18): 3 mL via INTRAVENOUS

## 2020-03-22 MED ORDER — DM-GUAIFENESIN ER 30-600 MG PO TB12
1.0000 | ORAL_TABLET | Freq: Two times a day (BID) | ORAL | Status: DC | PRN
  Administered 2020-03-22 – 2020-03-24 (×3): 1 via ORAL
  Filled 2020-03-22 (×5): qty 1

## 2020-03-22 MED ORDER — SODIUM CHLORIDE 0.9 % IV SOLN: INTRAVENOUS | Status: DC

## 2020-03-22 MED ORDER — PERFLUTREN LIPID MICROSPHERE
1.0000 mL | INTRAVENOUS | Status: AC | PRN
  Administered 2020-03-22: 3 mL via INTRAVENOUS
  Filled 2020-03-22: qty 10

## 2020-03-22 MED ORDER — SODIUM CHLORIDE 0.9 % IV SOLN: 250.0000 mL | INTRAVENOUS | Status: DC | PRN

## 2020-03-22 MED ORDER — POTASSIUM CHLORIDE CRYS ER 20 MEQ PO TBCR
40.0000 meq | EXTENDED_RELEASE_TABLET | ORAL | Status: AC
  Administered 2020-03-22 (×2): 40 meq via ORAL
  Filled 2020-03-22: qty 2

## 2020-03-22 NOTE — Progress Notes (Signed)
  Echocardiogram 2D Echocardiogram has been performed.  Geoffery Lyons Swaim 03/22/2020, 11:06 AM

## 2020-03-22 NOTE — Progress Notes (Signed)
Cardiology Progress Note  Patient ID: Roy [name] MRN: [id number] DOB: May 19, 1945 Date of Encounter: 03/22/2020  Primary Cardiologist: Ena Dawley, MD  Subjective   Chief Complaint: Shortness of breath  HPI: Breathing is improved with Lasix.  Still short of breath.  Hypoxia noted.  When he talks he is in the low 90s.  No chest pain reported.  ROS:  All other ROS reviewed and negative. Pertinent positives noted in the HPI.     Inpatient Medications  Scheduled Meds:  aspirin EC  81 mg Oral Daily   atorvastatin  40 mg Oral QHS   carvedilol  6.25 mg Oral BID WC   cholecalciferol  1,000 Units Oral QPM   insulin aspart  0-15 Units Subcutaneous TID WC   insulin aspart  0-5 Units Subcutaneous QHS   lisinopril  10 mg Oral Daily   pantoprazole  40 mg Oral Daily   potassium chloride  40 mEq Oral Q4H   spironolactone  12.5 mg Oral Daily   tamsulosin  0.4 mg Oral QHS   vitamin B-12  1,000 mcg Oral Daily   Continuous Infusions:  heparin 1,550 Units/hr (03/21/20 2050)   PRN Meds: benzonatate   Vital Signs   Vitals:   03/21/20 1741 03/21/20 1815 03/21/20 1937 03/21/20 2337  BP: 117/75 105/70 121/81 109/72  Pulse: (!) 112 (!) 105 99 97  Resp: (!) 21 20 16 18   Temp: 98.8 F (37.1 C) 98.5 F (36.9 C) 98.8 F (37.1 C) 98.9 F (37.2 C)  TempSrc: Oral Oral Oral Oral  SpO2: 95% 96% 92% 91%  Weight:      Height:        Intake/Output Summary (Last 24 hours) at 03/22/2020 0719 Last data filed at 03/22/2020 0600 Gross per 24 hour  Intake 1227.62 ml  Output 3955 ml  Net -2727.38 ml   Last 3 Weights 03/21/2020 03/11/2020 03/06/2020  Weight (lbs) 212 lb 210 lb 12.8 oz 227 lb  Weight (kg) 96.163 kg 95.618 kg 102.967 kg      Telemetry  Overnight telemetry shows sinus rhythm in the 90s, which I personally reviewed.   ECG  The most recent ECG shows sinus tachycardia heart rate 103, nonspecific IVCD, which I personally reviewed.   Physical Exam   Vitals:    03/21/20 1741 03/21/20 1815 03/21/20 1937 03/21/20 2337  BP: 117/75 105/70 121/81 109/72  Pulse: (!) 112 (!) 105 99 97  Resp: (!) 21 20 16 18   Temp: 98.8 F (37.1 C) 98.5 F (36.9 C) 98.8 F (37.1 C) 98.9 F (37.2 C)  TempSrc: Oral Oral Oral Oral  SpO2: 95% 96% 92% 91%  Weight:      Height:         Intake/Output Summary (Last 24 hours) at 03/22/2020 0719 Last data filed at 03/22/2020 0600 Gross per 24 hour  Intake 1227.62 ml  Output 3955 ml  Net -2727.38 ml    Last 3 Weights 03/21/2020 03/11/2020 03/06/2020  Weight (lbs) 212 lb 210 lb 12.8 oz 227 lb  Weight (kg) 96.163 kg 95.618 kg 102.967 kg    Body mass index is 27.97 kg/m.  General: Well nourished, well developed, in no acute distress Head: Atraumatic, normal size  Eyes: PEERLA, EOMI  Neck: Supple, JVD 7 to 8 cm of water Endocrine: No thryomegaly Cardiac: Normal S1, S2; RRR; no murmurs, rubs, or gallops Lungs: Crackles at the lung bases Abd: Soft, nontender, no hepatomegaly  Ext: No edema, pulses 2+ Musculoskeletal: No deformities, BUE and BLE  strength normal and equal Skin: Warm and dry, no rashes   Neuro: Alert and oriented to person, place, time, and situation, CNII-XII grossly intact, no focal deficits  Psych: Normal mood and affect   Labs  High Sensitivity Troponin:   Recent Labs  Lab 03/21/20 0037 03/21/20 0326 03/21/20 0711 03/21/20 1708 03/21/20 1840  TROPONINIHS 551* 773* 1,257* 1,389* 1,415*     Cardiac EnzymesNo results for input(s): TROPONINI in the last 168 hours. No results for input(s): TROPIPOC in the last 168 hours.  Chemistry Recent Labs  Lab 03/21/20 0037 03/22/20 0236  NA 139 141  K 4.1 3.0*  CL 108 105  CO2 22 23  GLUCOSE 201* 161*  BUN 17 15  CREATININE 0.95 1.04  CALCIUM 9.4 9.0  PROT 6.4*  --   ALBUMIN 3.4*  --   AST 21  --   ALT 12  --   ALKPHOS 59  --   BILITOT 0.8  --   GFRNONAA >60 >60  ANIONGAP 9 13    Hematology Recent Labs  Lab 03/21/20 0037 03/22/20 0236   WBC 16.6* 11.4*  RBC 4.05* 3.46*  HGB 12.3* 10.7*  HCT 37.6* 31.5*  MCV 92.8 91.0  MCH 30.4 30.9  MCHC 32.7 34.0  RDW 13.7 13.9  PLT 382 325   BNP Recent Labs  Lab 03/21/20 0037  BNP 648.5*    DDimer  Recent Labs  Lab 03/21/20 0037  DDIMER 0.92*     Radiology  CT Angio Chest PE W and/or Wo Contrast  Result Date: 03/21/2020 CLINICAL DATA:  Pulmonary embolism, elevated D-dimer EXAM: CT ANGIOGRAPHY CHEST WITH CONTRAST TECHNIQUE: Multidetector CT imaging of the chest was performed using the standard protocol during bolus administration of intravenous contrast. Multiplanar CT image reconstructions and MIPs were obtained to evaluate the vascular anatomy. CONTRAST:  85mL OMNIPAQUE IOHEXOL 350 MG/ML SOLN COMPARISON:  None. FINDINGS: Cardiovascular: There is adequate opacification of the pulmonary arterial tree. There is no intraluminal filling defect identified to suggest acute pulmonary embolism. The central pulmonary arteries are of normal caliber. There is extensive multi-vessel coronary artery calcification. Cardiac size is mildly enlarged with left ventricular dilation noted. Trace pericardial effusion. Mild atherosclerotic calcification is seen within the thoracic aorta. Mediastinum/Nodes: Thyroid unremarkable. No pathologic thoracic adenopathy. Moderate hiatal hernia. Fluid within the distal esophagus may reflect changes of gastroesophageal reflux. Lungs/Pleura: There is is symmetric bilateral interstitial and airspace infiltrate most in keeping with moderate pulmonary edema. Small bilateral pleural effusions are present. No pneumothorax. Central airways are widely patent. Upper Abdomen: Scattered hepatic hypodensities within the right hepatic lobe may represent tiny hepatic cysts, but are not well characterized on this examination. No acute abnormality within the visualized abdomen. Musculoskeletal: The osseous structures are age-appropriate. No acute bone abnormality Review of the MIP  images confirms the above findings. IMPRESSION: No acute pulmonary embolism. Extensive coronary artery calcification.  Left ventricular dilation. Moderate cardiogenic failure. Moderate hiatal hernia with probable gastroesophageal reflux. Aortic Atherosclerosis (ICD10-I70.0). Electronically Signed   By: Fidela Salisbury MD   On: 03/21/2020 03:11   DG Chest Portable 1 View  Result Date: 03/21/2020 CLINICAL DATA:  Shortness of breath EXAM: PORTABLE CHEST 1 VIEW COMPARISON:  None. FINDINGS: The heart size and mediastinal contours are mildly enlarged. Aortic knob calcifications are seen. Diffusely increased hazy interstitial airspace opacity seen throughout both lungs. No acute osseous abnormality. IMPRESSION: Interstitial airspace opacities throughout both lungs which could be due to edema, inflammatory, and/or infectious etiology Electronically Signed  By: Prudencio Pair M.D.   On: 03/21/2020 00:57    Cardiac Studies  CTA PE study with extensive coronary calcifications and dilation of the LV  Patient Profile  LIRON EISSLER is a 75 y.o. male with hypertension, diabetes, GERD, nonischemic cardiomyopathy with recovery of EF who was admitted on 03/21/2020 with acute decompensated congestive heart failure and non-STEMI.  Assessment & Plan   1.  Acute shortness of breath/congestive heart failure/non-STEMI -Admitted with sudden onset shortness of breath with laying down.  Found to have elevated BNP and vascular congestion on CT chest.  Troponin is elevated.  EKG without ischemic changes. -History of nonischemic cardiomyopathy with recovered EF. -His symptoms are concerning for systolic heart failure with possibly non-STEMI. -We will continue aspirin heparin drip.  N.p.o. for left and right heart catheterization tomorrow. -His echo is pending. -Continue high intensity statin. -He is net -2.7 liters.  Potassium 3.0.  Hold further diuresis.  Replace potassium.  We will attempt to give an IV dose of Lasix this  afternoon. -I did review his CT chest.  I would like to repeat a chest x-ray.  If he still has interstitial markings he may have pneumonia.  White count was elevated.  We will also check procalcitonin. -We will continue beta-blocker and home lisinopril.  I have added Aldactone to help with potassium. -He may need change to [facility] based on results of echo. -His acute change could represent left main disease which precipitated acute pulmonary edema.  He does have severe three-vessel calcifications on CT scan.  2.  Non-STEMI -Suspect secondary to congestive heart failure.  See discussion above.  Continue aspirin heparin drip.  N.p.o. midnight for left and right heart catheterization. -He is on a beta-blocker. -No chest pain reported.  3.  Diabetes -A1c 6.2. -Hold oral hypoglycemic agents.  Sliding scale insulin while in-house.  4.  Leukocytosis -Repeat chest x-ray as above.  Procalcitonin pending.  No fevers chills reported.  Low suspicion for pneumonia.  5.  Hypokalemia -Secondary to diuresis.  Replace potassium.  6.  Hyperlipidemia -Stop pravastatin.  On Lipitor.  FEN -No intravenous fluids -Diet: Salt restricted, n.p.o. at midnight -DVT PPx: Heparin drip -Code: Full  Shared Decision Making/Informed Consent The risks [stroke (1 in 1000), death (1 in 38), kidney failure [usually temporary] (1 in 500), bleeding (1 in 200), allergic reaction [possibly serious] (1 in 200)], benefits (diagnostic support and management of coronary artery disease) and alternatives of a cardiac catheterization were discussed in detail with Mr. Sgro and he is willing to proceed.  For questions or updates, please contact [place] Please consult [email or web address] for contact info under   Time Spent with Patient: I have spent a total of 35 minutes with patient reviewing hospital notes, telemetry, EKGs, labs and examining the patient as well as establishing an assessment and plan that was discussed with  the patient.  > 50% of time was spent in direct patient care.    Signed, Addison Naegeli. [name], [place]  03/22/2020 7:19 AM

## 2020-03-22 NOTE — Progress Notes (Signed)
ANTICOAGULATION CONSULT NOTE  Pharmacy Consult for Heparin  Indication: chest pain/ACS  No Known Allergies  Vital Signs: Temp: 98.9 F (37.2 C) (02/26 2337) Temp Source: Oral (02/26 2337) BP: 109/72 (02/26 2337) Pulse Rate: 97 (02/26 2337)  Heparin dosing weight: 96.2 kg  Labs: Recent Labs    03/21/20 0037 03/21/20 0326 03/21/20 0711 03/21/20 1145 03/21/20 1708 03/21/20 1840 03/22/20 0236 03/22/20 0725  HGB 12.3*  --   --   --   --   --  10.7*  --   HCT 37.6*  --   --   --   --   --  31.5*  --   PLT 382  --   --   --   --   --  325  --   HEPARINUNFRC  --   --   --  0.17*  --  0.33  --  0.49  CREATININE 0.95  --   --   --   --   --  1.04  --   TROPONINIHS 551*   < > 1,257*  --  1,389* 1,415*  --   --    < > = values in this interval not displayed.    Estimated Creatinine Clearance: 73.8 mL/min (by C-G formula based on SCr of 1.04 mg/dL).   Medical History: Past Medical History:  Diagnosis Date  . Allergic rhinitis   . Arthritis    wrists  . Cardiomyopathy, nonischemic [facility])    followed by cardiology--- dr [name]---  2016 ef 45-50% ,  2016 nuclear ef 37%,  2017 per echo ef 50-55%  . CHF (congestive heart failure), NYHA class III ([place]) 03/21/2020  . GERD (gastroesophageal reflux disease)   . Hiatal hernia   . History of kidney stones   . 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 History of syncope (03-06-2020 pt stated has not had sycopal episode in few years, stated it seems to happen in extreme hot conditions)   cardiologist--- dr [name]--- dx recurrent syncope;  nuclear study 11-03-2014 intermediate risk w/ no ischemia, apical hypokinesis, nuclear ef 37%;  event monitor-- 12-21-2015 SB/ ST  no pauses/ arrythmia's;  echo 08-03-2015 ef 50-55%  . Hyperlipidemia   . Hypertension    followed by pcp  . Hypovitaminosis D   . Mass of left testicle   . Nocturia   . Plantar fasciitis    . Presence of surgical incision    01/ 2022  moh's w/ skin graft of nose, per pt still healing and wear bandage daily  . Type 2 diabetes mellitus ([place])    pt is adament that he is not and have been told he is a diabetic but a borderline;  followed by pcp, in pcp note states DM2 and takes 2 meds daily  . Wears glasses   . Wears hearing aid in both ears     Assessment: 75 y/o M with shortness of breath/epigastric burning. CT Angio negative for PE. Troponin is elevated. Starting heparin. PTA meds reviewed.   Heparin level therapeutic this morning at 0.49. H/H slightly down today, plt wnl. No bleeding per RN.   Goal of Therapy:  Heparin level 0.3-0.7 units/ml Monitor platelets by anticoagulation protocol: Yes  Plan:  Continue heparin drip at 1550 units/hr Daily CBC/Heparin level Monitor for bleeding Plan for cath tomorrow  Rebbeca Paul, PharmD PGY1 Pharmacy Resident 03/22/2020 8:12  AM  Please check [email or web address] for unit-specific pharmacy phone numbers.

## 2020-03-23 ENCOUNTER — Encounter (HOSPITAL_COMMUNITY)
Admission: EM | Disposition: A | Discharge status: Home / Self Care | Payer: Self-pay | Source: Home / Self Care | Attending: Cardiothoracic Surgery

## 2020-03-23 ENCOUNTER — Inpatient Hospital Stay (HOSPITAL_COMMUNITY): Payer: Medicare HMO

## 2020-03-23 ENCOUNTER — Encounter (HOSPITAL_COMMUNITY): Payer: Self-pay | Admitting: Interventional Cardiology

## 2020-03-23 ENCOUNTER — Telehealth: Payer: Self-pay | Admitting: Hematology

## 2020-03-23 DIAGNOSIS — I5021 Acute systolic (congestive) heart failure: Secondary | ICD-10-CM

## 2020-03-23 DIAGNOSIS — Z01810 Encounter for preprocedural cardiovascular examination: Secondary | ICD-10-CM

## 2020-03-23 DIAGNOSIS — I25110 Atherosclerotic heart disease of native coronary artery with unstable angina pectoris: Secondary | ICD-10-CM

## 2020-03-23 DIAGNOSIS — I214 Non-ST elevation (NSTEMI) myocardial infarction: Secondary | ICD-10-CM

## 2020-03-23 HISTORY — PX: RIGHT/LEFT HEART CATH AND CORONARY ANGIOGRAPHY: CATH118266

## 2020-03-23 LAB — PULMONARY FUNCTION TEST
FEF 25-75 Pre: 1.16 L/sec
FEF2575-%Pred-Pre: 46 %
FEV1-%Pred-Pre: 56 %
FEV1-Pre: 1.92 L
FEV1FVC-%Pred-Pre: 98 %
FEV6-%Pred-Pre: 59 %
FEV6-Pre: 2.62 L
FEV6FVC-%Pred-Pre: 103 %
FVC-%Pred-Pre: 57 %
FVC-Pre: 2.69 L
Pre FEV1/FVC ratio: 71 %
Pre FEV6/FVC Ratio: 98 %

## 2020-03-23 LAB — POCT I-STAT EG7
Acid-base deficit: 1 mmol/L (ref 0.0–2.0)
Bicarbonate: 23.7 mmol/L (ref 20.0–28.0)
Calcium, Ion: 1.19 mmol/L (ref 1.15–1.40)
HCT: 29 % — ABNORMAL LOW (ref 39.0–52.0)
Hemoglobin: 9.9 g/dL — ABNORMAL LOW (ref 13.0–17.0)
O2 Saturation: 66 %
Potassium: 3.5 mmol/L (ref 3.5–5.1)
Sodium: 141 mmol/L (ref 135–145)
TCO2: 25 mmol/L (ref 22–32)
pCO2, Ven: 36.3 mmHg — ABNORMAL LOW (ref 44.0–60.0)
pH, Ven: 7.423 (ref 7.250–7.430)
pO2, Ven: 33 mmHg (ref 32.0–45.0)

## 2020-03-23 LAB — POCT I-STAT 7, (LYTES, BLD GAS, ICA,H+H)
Acid-base deficit: 2 mmol/L (ref 0.0–2.0)
Bicarbonate: 21.8 mmol/L (ref 20.0–28.0)
Calcium, Ion: 1.19 mmol/L (ref 1.15–1.40)
HCT: 28 % — ABNORMAL LOW (ref 39.0–52.0)
Hemoglobin: 9.5 g/dL — ABNORMAL LOW (ref 13.0–17.0)
O2 Saturation: 94 %
Potassium: 3.5 mmol/L (ref 3.5–5.1)
Sodium: 140 mmol/L (ref 135–145)
TCO2: 23 mmol/L (ref 22–32)
pCO2 arterial: 31.9 mmHg — ABNORMAL LOW (ref 32.0–48.0)
pH, Arterial: 7.443 (ref 7.350–7.450)
pO2, Arterial: 66 mmHg — ABNORMAL LOW (ref 83.0–108.0)

## 2020-03-23 LAB — BASIC METABOLIC PANEL
Anion gap: 13 (ref 5–15)
BUN: 20 mg/dL (ref 8–23)
CO2: 21 mmol/L — ABNORMAL LOW (ref 22–32)
Calcium: 8.7 mg/dL — ABNORMAL LOW (ref 8.9–10.3)
Chloride: 105 mmol/L (ref 98–111)
Creatinine, Ser: 1.05 mg/dL (ref 0.61–1.24)
GFR, Estimated: >60 mL/min (ref >60)
Glucose, Bld: 166 mg/dL — ABNORMAL HIGH (ref 70–99)
Potassium: 3.5 mmol/L (ref 3.5–5.1)
Sodium: 139 mmol/L (ref 135–145)

## 2020-03-23 LAB — HEPARIN LEVEL (UNFRACTIONATED): Heparin Unfractionated: 0.35 IU/mL (ref 0.30–0.70)

## 2020-03-23 LAB — CBC
HCT: 31.4 % — ABNORMAL LOW (ref 39.0–52.0)
Hemoglobin: 10.3 g/dL — ABNORMAL LOW (ref 13.0–17.0)
MCH: 30.4 pg (ref 26.0–34.0)
MCHC: 32.8 g/dL (ref 30.0–36.0)
MCV: 92.6 fL (ref 80.0–100.0)
Platelets: 298 10*3/uL (ref 150–400)
RBC: 3.39 MIL/uL — ABNORMAL LOW (ref 4.22–5.81)
RDW: 13.8 % (ref 11.5–15.5)
WBC: 12.2 10*3/uL — ABNORMAL HIGH (ref 4.0–10.5)
nRBC: 0 % (ref 0.0–0.2)

## 2020-03-23 LAB — GLUCOSE, CAPILLARY
Glucose-Capillary: 161 mg/dL — ABNORMAL HIGH (ref 70–99)
Glucose-Capillary: 151 mg/dL — ABNORMAL HIGH (ref 70–99)
Glucose-Capillary: 135 mg/dL — ABNORMAL HIGH (ref 70–99)
Glucose-Capillary: 240 mg/dL — ABNORMAL HIGH (ref 70–99)

## 2020-03-23 SURGERY — RIGHT/LEFT HEART CATH AND CORONARY ANGIOGRAPHY
Anesthesia: LOCAL

## 2020-03-23 MED ORDER — SODIUM CHLORIDE 0.9 % IV SOLN: 250.0000 mL | INTRAVENOUS | Status: DC | PRN

## 2020-03-23 MED ORDER — FENTANYL CITRATE (PF) 100 MCG/2ML IJ SOLN
INTRAMUSCULAR | Status: AC
  Filled 2020-03-23: qty 2

## 2020-03-23 MED ORDER — HYDRALAZINE HCL 20 MG/ML IJ SOLN: 10.0000 mg | INTRAMUSCULAR | Status: AC | PRN

## 2020-03-23 MED ORDER — LIDOCAINE HCL (PF) 1 % IJ SOLN
INTRAMUSCULAR | Status: AC
  Filled 2020-03-23: qty 30

## 2020-03-23 MED ORDER — HEPARIN SODIUM (PORCINE) 1000 UNIT/ML IJ SOLN
INTRAMUSCULAR | Status: DC | PRN
  Administered 2020-03-23: 5000 [IU] via INTRAVENOUS

## 2020-03-23 MED ORDER — ONDANSETRON HCL 4 MG/2ML IJ SOLN: 4.0000 mg | Freq: Four times a day (QID) | INTRAMUSCULAR | Status: DC | PRN

## 2020-03-23 MED ORDER — LIDOCAINE HCL (PF) 1 % IJ SOLN
INTRAMUSCULAR | Status: DC | PRN
  Administered 2020-03-23 (×2): 2 mL

## 2020-03-23 MED ORDER — HEPARIN SODIUM (PORCINE) 1000 UNIT/ML IJ SOLN
INTRAMUSCULAR | Status: AC
  Filled 2020-03-23: qty 1

## 2020-03-23 MED ORDER — VERAPAMIL HCL 2.5 MG/ML IV SOLN
INTRAVENOUS | Status: DC | PRN
  Administered 2020-03-23: 10 mL via INTRA_ARTERIAL

## 2020-03-23 MED ORDER — HEPARIN (PORCINE) IN NACL 1000-0.9 UT/500ML-% IV SOLN
INTRAVENOUS | Status: AC
  Filled 2020-03-23: qty 1000

## 2020-03-23 MED ORDER — HEPARIN (PORCINE) IN NACL 1000-0.9 UT/500ML-% IV SOLN
INTRAVENOUS | Status: DC | PRN
  Administered 2020-03-23 (×2): 500 mL

## 2020-03-23 MED ORDER — HEPARIN (PORCINE) 25000 UT/250ML-% IV SOLN
1550.0000 [IU]/h | INTRAVENOUS | Status: DC
  Administered 2020-03-23 – 2020-03-25 (×3): 1550 [IU]/h via INTRAVENOUS
  Filled 2020-03-23 (×3): qty 250

## 2020-03-23 MED ORDER — VERAPAMIL HCL 2.5 MG/ML IV SOLN
INTRAVENOUS | Status: AC
  Filled 2020-03-23: qty 2

## 2020-03-23 MED ORDER — SODIUM CHLORIDE 0.9% FLUSH: 3.0000 mL | INTRAVENOUS | Status: DC | PRN

## 2020-03-23 MED ORDER — LABETALOL HCL 5 MG/ML IV SOLN: 10.0000 mg | INTRAVENOUS | Status: AC | PRN

## 2020-03-23 MED ORDER — MIDAZOLAM HCL 2 MG/2ML IJ SOLN
INTRAMUSCULAR | Status: AC
  Filled 2020-03-23: qty 2

## 2020-03-23 MED ORDER — FENTANYL CITRATE (PF) 100 MCG/2ML IJ SOLN
INTRAMUSCULAR | Status: DC | PRN
  Administered 2020-03-23: 25 ug via INTRAVENOUS

## 2020-03-23 MED ORDER — SODIUM CHLORIDE 0.9% FLUSH
3.0000 mL | Freq: Two times a day (BID) | INTRAVENOUS | Status: DC
  Administered 2020-03-23 – 2020-03-26 (×6): 3 mL via INTRAVENOUS

## 2020-03-23 MED ORDER — MIDAZOLAM HCL 2 MG/2ML IJ SOLN
INTRAMUSCULAR | Status: DC | PRN
  Administered 2020-03-23: 2 mg via INTRAVENOUS

## 2020-03-23 MED ORDER — METOPROLOL SUCCINATE ER 25 MG PO TB24
25.0000 mg | ORAL_TABLET | Freq: Every day | ORAL | Status: DC
  Administered 2020-03-23 – 2020-03-24 (×2): 25 mg via ORAL
  Filled 2020-03-23 (×2): qty 1

## 2020-03-23 MED ORDER — IOHEXOL 350 MG/ML SOLN
INTRAVENOUS | Status: DC | PRN
  Administered 2020-03-23: 70 mL

## 2020-03-23 MED ORDER — ACETAMINOPHEN 325 MG PO TABS: 650.0000 mg | ORAL_TABLET | ORAL | Status: DC | PRN

## 2020-03-23 SURGICAL SUPPLY — 12 items

## 2020-03-23 NOTE — Telephone Encounter (Signed)
Received a call from the pt's wife to reschedule his new pt appt w/Dr. Irene Limbo to 3/10 at 11am due to being in the hospital.

## 2020-03-23 NOTE — Progress Notes (Signed)
   03/23/20 2041  Assess: MEWS Score  Temp 97.6 F (36.4 C)  BP 126/86  Pulse Rate (!) 56  ECG Heart Rate (!) 111  Resp 17  SpO2 90 %  O2 Device Room Air  Assess: MEWS Score  MEWS Temp 0  MEWS Systolic 0  MEWS Pulse 2  MEWS RR 0  MEWS LOC 0  MEWS Score 2  MEWS Score Color Yellow  Assess: if the MEWS score is Yellow or Red  Were vital signs taken at a resting state? Yes  Focused Assessment No change from prior assessment  Early Detection of Sepsis Score *See Row Information* Low  MEWS guidelines implemented *See Row Information* Yes  Treat  MEWS Interventions Administered scheduled meds/treatments;Escalated (See documentation below)  Pain Scale 0-10  Pain Score 0  Patients Stated Pain Goal 0  Take Vital Signs  Increase Vital Sign Frequency  Yellow: Q 2hr X 2 then Q 4hr X 2, if remains yellow, continue Q 4hrs  Escalate  MEWS: Escalate Yellow: discuss with charge nurse/RN and consider discussing with provider and RRT  Notify: Charge Nurse/RN  Name of Charge Nurse/RN Notified Heather, RN  Date Charge Nurse/RN Notified 03/23/20  Time Charge Nurse/RN Notified 2207  Document  Patient Outcome Stabilized after interventions  Progress note created (see row info) Yes

## 2020-03-23 NOTE — Progress Notes (Signed)
Pre-CABG vascular assessment completed    Please see CV Proc for preliminary results.   Vonzell Schlatter, RVT

## 2020-03-23 NOTE — Interval H&P Note (Signed)
Cath Lab Visit (complete for each Cath Lab visit)  Clinical Evaluation Leading to the Procedure:   ACS: Yes.    Non-ACS:    Anginal Classification: CCS IV  Anti-ischemic medical therapy: Minimal Therapy (1 class of medications)  Non-Invasive Test Results: No non-invasive testing performed  Prior CABG: No previous CABG      History and Physical Interval Note:  03/23/2020 9:09 AM  Roy [name]  has presented today for surgery, with the diagnosis of NSTEMI.  The various methods of treatment have been discussed with the patient and family. After consideration of risks, benefits and other options for treatment, the patient has consented to  Procedure(s): RIGHT/LEFT HEART CATH AND CORONARY ANGIOGRAPHY (N/A) as a surgical intervention.  The patient's history has been reviewed, patient examined, no change in status, stable for surgery.  I have reviewed the patient's chart and labs.  Questions were answered to the patient's satisfaction.     Larae Grooms

## 2020-03-23 NOTE — H&P (View-Only) (Signed)
Cardiology Progress Note  Patient ID: Roy [name] MRN: [id number] DOB: 1945-03-19 Date of Encounter: 03/23/2020  Primary Cardiologist: Ena Dawley, MD  Subjective   Chief Complaint: None.  HPI: N.p.o. for left and right heart catheterization.  No further chest pain.  Still having shortness of breath when he lays flat.  ROS:  All other ROS reviewed and negative. Pertinent positives noted in the HPI.     Inpatient Medications  Scheduled Meds: . aspirin EC  81 mg Oral Daily  . atorvastatin  40 mg Oral QHS  . carvedilol  6.25 mg Oral BID WC  . cholecalciferol  1,000 Units Oral QPM  . insulin aspart  0-15 Units Subcutaneous TID WC  . insulin aspart  0-5 Units Subcutaneous QHS  . lisinopril  10 mg Oral Daily  . pantoprazole  40 mg Oral Daily  . sodium chloride flush  3 mL Intravenous Q12H  . spironolactone  12.5 mg Oral Daily  . tamsulosin  0.4 mg Oral QHS  . vitamin B-12  1,000 mcg Oral Daily   Continuous Infusions: . sodium chloride    . sodium chloride 10 mL/hr at 03/23/20 0603  . heparin 1,550 Units/hr (03/21/20 2050)   PRN Meds: sodium chloride, benzonatate, dextromethorphan-guaiFENesin, sodium chloride flush   Vital Signs   Vitals:   03/22/20 0838 03/22/20 1847 03/22/20 2039 03/23/20 0550  BP: 113/71 117/73 129/90 112/79  Pulse: (!) 110 (!) 105 (!) 105 93  Resp: 18 17 16 19   Temp: (!) 97.5 F (36.4 C) 97.7 F (36.5 C) (!) 97.4 F (36.3 C) 97.6 F (36.4 C)  TempSrc: Oral Oral Oral Oral  SpO2: 95% 95% 95% 98%  Weight:    98.7 kg  Height:        Intake/Output Summary (Last 24 hours) at 03/23/2020 0841 Last data filed at 03/23/2020 0603 Gross per 24 hour  Intake 403.25 ml  Output 500 ml  Net -96.75 ml   Last 3 Weights 03/23/2020 03/21/2020 03/11/2020  Weight (lbs) 217 lb 11.2 oz 212 lb 210 lb 12.8 oz  Weight (kg) 98.748 kg 96.163 kg 95.618 kg      Telemetry  Overnight telemetry shows sinus rhythm heart rate in the 90s, A. tach episode 13 seconds this  morning, which I personally reviewed.   Physical Exam   Vitals:   03/22/20 0838 03/22/20 1847 03/22/20 2039 03/23/20 0550  BP: 113/71 117/73 129/90 112/79  Pulse: (!) 110 (!) 105 (!) 105 93  Resp: 18 17 16 19   Temp: (!) 97.5 F (36.4 C) 97.7 F (36.5 C) (!) 97.4 F (36.3 C) 97.6 F (36.4 C)  TempSrc: Oral Oral Oral Oral  SpO2: 95% 95% 95% 98%  Weight:    98.7 kg  Height:         Intake/Output Summary (Last 24 hours) at 03/23/2020 0841 Last data filed at 03/23/2020 0603 Gross per 24 hour  Intake 403.25 ml  Output 500 ml  Net -96.75 ml    Last 3 Weights 03/23/2020 03/21/2020 03/11/2020  Weight (lbs) 217 lb 11.2 oz 212 lb 210 lb 12.8 oz  Weight (kg) 98.748 kg 96.163 kg 95.618 kg    Body mass index is 28.72 kg/m.   General: Well nourished, well developed, in no acute distress Head: Atraumatic, normal size  Eyes: PEERLA, EOMI  Neck: Supple, no JVD Endocrine: No thryomegaly Cardiac: Normal S1, S2; RRR; no murmurs, rubs, or gallops Lungs: Crackles in the lung bases Abd: Soft, nontender, no hepatomegaly  Ext: No  edema, pulses 2+ Musculoskeletal: No deformities, BUE and BLE strength normal and equal Skin: Warm and dry, no rashes   Neuro: Alert and oriented to person, place, time, and situation, CNII-XII grossly intact, no focal deficits  Psych: Normal mood and affect   Labs  High Sensitivity Troponin:   Recent Labs  Lab 03/21/20 0037 03/21/20 0326 03/21/20 0711 03/21/20 1708 03/21/20 1840  TROPONINIHS 551* 773* 1,257* 1,389* 1,415*     Cardiac EnzymesNo results for input(s): TROPONINI in the last 168 hours. No results for input(s): TROPIPOC in the last 168 hours.  Chemistry Recent Labs  Lab 03/21/20 0037 03/22/20 0236 03/22/20 1449 03/23/20 0138  NA 139 141 138 139  K 4.1 3.0* 3.7 3.5  CL 108 105 105 105  CO2 22 23 23  21*  GLUCOSE 201* 161* 230* 166*  BUN 17 15 18 20   CREATININE 0.95 1.04 1.18 1.05  CALCIUM 9.4 9.0 8.8* 8.7*  PROT 6.4*  --   --   --    ALBUMIN 3.4*  --   --   --   AST 21  --   --   --   ALT 12  --   --   --   ALKPHOS 59  --   --   --   BILITOT 0.8  --   --   --   GFRNONAA >60 >60 >60 >60  ANIONGAP 9 13 10 13     Hematology Recent Labs  Lab 03/21/20 0037 03/22/20 0236 03/23/20 0138  WBC 16.6* 11.4* 12.2*  RBC 4.05* 3.46* 3.39*  HGB 12.3* 10.7* 10.3*  HCT 37.6* 31.5* 31.4*  MCV 92.8 91.0 92.6  MCH 30.4 30.9 30.4  MCHC 32.7 34.0 32.8  RDW 13.7 13.9 13.8  PLT 382 325 298   BNP Recent Labs  Lab 03/21/20 0037  BNP 648.5*    DDimer  Recent Labs  Lab 03/21/20 0037  DDIMER 0.92*     Radiology  DG CHEST PORT 1 VIEW  Result Date: 03/22/2020 CLINICAL DATA:  Shortness of breath. EXAM: PORTABLE CHEST 1 VIEW COMPARISON:  03/21/2020 FINDINGS: Hypoinflation without focal airspace consolidation or effusion. Interval improvement of pulmonary vasculature compatible with near resolution of interstitial edema. Cardiomediastinal silhouette and remainder of the exam is unchanged. IMPRESSION: Interval improvement of pulmonary vascular compatible with near resolution of interstitial edema. Electronically Signed   By: Marin Olp M.D.   On: 03/22/2020 08:46   ECHOCARDIOGRAM COMPLETE  Result Date: 03/22/2020    ECHOCARDIOGRAM REPORT   Patient Name:   Roy [name] Date of Exam: 03/22/2020 Medical Rec #:  [id number]     Height:       73.0 in Accession #:    [id number]    Weight:       212.0 lb Date of Birth:  12-Aug-1945      BSA:          2.206 m Patient Age:    75 years      BP:           109/72 mmHg Patient Gender: M             HR:           96 bpm. Exam Location:  Inpatient Procedure: 2D Echo, Cardiac Doppler, Color Doppler and Intracardiac            Opacification Agent Indications:    NSTEMI I21.4  History:        Patient has prior history of Echocardiogram examinations,  most                 recent 08/03/2015. CHF and Cardiomyopathy,                 Signs/Symptoms:Syncope; Risk Factors:Diabetes, Dyslipidemia and                  Former Smoker.  Sonographer:    Vickie Epley RDCS Referring Phys: [id number] Jane  1. Global hypokinesis with akinesis of the inferior and inferolateral walls; overall severe LV dysfunction.  2. Left ventricular ejection fraction, by estimation, is 25 to 30%. The left ventricle has severely decreased function. The left ventricle demonstrates regional wall motion abnormalities (see scoring diagram/findings for description). The left ventricular internal cavity size was mildly dilated. Left ventricular diastolic parameters are consistent with Grade I diastolic dysfunction (impaired relaxation).  3. Right ventricular systolic function is normal. The right ventricular size is normal. Tricuspid regurgitation signal is inadequate for assessing PA pressure.  4. The mitral valve is normal in structure. Mild mitral valve regurgitation. No evidence of mitral stenosis.  5. The aortic valve is tricuspid. Aortic valve regurgitation is not visualized. Mild aortic valve sclerosis is present, with no evidence of aortic valve stenosis.  6. The inferior vena cava is normal in size with greater than 50% respiratory variability, suggesting right atrial pressure of 3 mmHg. FINDINGS  Left Ventricle: Left ventricular ejection fraction, by estimation, is 25 to 30%. The left ventricle has severely decreased function. The left ventricle demonstrates regional wall motion abnormalities. Definity contrast agent was given IV to delineate the left ventricular endocardial borders. The left ventricular internal cavity size was mildly dilated. There is no left ventricular hypertrophy. Left ventricular diastolic parameters are consistent with Grade I diastolic dysfunction (impaired relaxation). Right Ventricle: The right ventricular size is normal.Right ventricular systolic function is normal. Tricuspid regurgitation signal is inadequate for assessing PA pressure. The tricuspid regurgitant velocity is 1.72 m/s, and with an  assumed right atrial pressure of 3 mmHg, the estimated right ventricular systolic pressure is 22.0 mmHg. Left Atrium: Left atrial size was normal in size. Right Atrium: Right atrial size was normal in size. Pericardium: Trivial pericardial effusion is present. Mitral Valve: The mitral valve is normal in structure. Mild mitral valve regurgitation. No evidence of mitral valve stenosis. Tricuspid Valve: The tricuspid valve is normal in structure. Tricuspid valve regurgitation is trivial. No evidence of tricuspid stenosis. Aortic Valve: The aortic valve is tricuspid. Aortic valve regurgitation is not visualized. Mild aortic valve sclerosis is present, with no evidence of aortic valve stenosis. Pulmonic Valve: The pulmonic valve was not well visualized. Pulmonic valve regurgitation is not visualized. No evidence of pulmonic stenosis. Aorta: The aortic root is normal in size and structure. Venous: The inferior vena cava is normal in size with greater than 50% respiratory variability, suggesting right atrial pressure of 3 mmHg. IAS/Shunts: The interatrial septum was not well visualized. Additional Comments: Global hypokinesis with akinesis of the inferior and inferolateral walls; overall severe LV dysfunction.  LEFT VENTRICLE PLAX 2D LVIDd:         5.60 cm      Diastology LVIDs:         5.20 cm      LV e' medial:   6.31 cm/s LV PW:         0.80 cm      LV E/e' medial: 9.2 LV IVS:        0.80 cm LVOT diam:  2.80 cm LV SV:         61 LV SV Index:   28 LVOT Area:     6.16 cm  LV Volumes (MOD) LV vol d, MOD A4C: 145.0 ml LV vol s, MOD A4C: 118.0 ml LV SV MOD A4C:     145.0 ml RIGHT VENTRICLE RV S prime:     14.50 cm/s TAPSE (M-mode): 2.0 cm LEFT ATRIUM             Index       RIGHT ATRIUM           Index LA diam:        3.60 cm 1.63 cm/m  RA Area:     14.20 cm LA Vol (A2C):   65.8 ml 29.83 ml/m RA Volume:   36.60 ml  16.59 ml/m LA Vol (A4C):   50.7 ml 22.99 ml/m LA Biplane Vol: 60.9 ml 27.61 ml/m  AORTIC VALVE LVOT  Vmax:   55.30 cm/s LVOT Vmean:  39.500 cm/s LVOT VTI:    0.100 m  AORTA Ao Root diam: 3.60 cm Ao Asc diam:  3.50 cm MITRAL VALVE               TRICUSPID VALVE MV Area (PHT): 5.58 cm    TR Peak grad:   11.8 mmHg MV Decel Time: 136 msec    TR Vmax:        172.00 cm/s MV E velocity: 57.80 cm/s MV A velocity: 82.30 cm/s  SHUNTS MV E/A ratio:  0.70        Systemic VTI:  0.10 m                            Systemic Diam: 2.80 cm Kirk Ruths MD Electronically signed by Kirk Ruths MD Signature Date/Time: 03/22/2020/11:12:28 AM    Final     Cardiac Studies  TTE 03/22/2020 1. Global hypokinesis with akinesis of the inferior and inferolateral  walls; overall severe LV dysfunction.  2. Left ventricular ejection fraction, by estimation, is 25 to 30%. The  left ventricle has severely decreased function. The left ventricle  demonstrates regional wall motion abnormalities (see scoring  diagram/findings for description). The left  ventricular internal cavity size was mildly dilated. Left ventricular  diastolic parameters are consistent with Grade I diastolic dysfunction  (impaired relaxation).  3. Right ventricular systolic function is normal. The right ventricular  size is normal. Tricuspid regurgitation signal is inadequate for assessing  PA pressure.  4. The mitral valve is normal in structure. Mild mitral valve  regurgitation. No evidence of mitral stenosis.  5. The aortic valve is tricuspid. Aortic valve regurgitation is not  visualized. Mild aortic valve sclerosis is present, with no evidence of  aortic valve stenosis.  6. The inferior vena cava is normal in size with greater than 50%  respiratory variability, suggesting right atrial pressure of 3 mmHg.   Patient Profile  Roy [name] is a 75 y.o. male with hypertension, diabetes, GERD, nonischemic cardiomyopathy with recovery of EF who was admitted on 03/21/2020 with acute decompensated congestive heart failure and non-STEMI.  Assessment  & Plan   1.  Acute systolic heart failure, EF 25% with regional wall motion normalities -Effectively euvolemic. -Left and right heart cath today. -Stop lisinopril.  Plan for Entresto after washout. -Transition Coreg to metoprolol succinate due to atrial tachycardia.  Continue Aldactone 12.5 mg daily. -Hold diuresis until we see what his LVEDP  is.  He appears euvolemic to me. -We will add an SGLT2 inhibitor as well this admission.  2.  Non-STEMI -Admitted with non-STEMI.  Severe three-vessel calcification seen on coronary CTA.  Continue heparin drip.  On aspirin.  Plan for left heart catheterization today. -On high intensity statin.  3.  Atrial tachycardia -Brief A. tach overnight.  Transition Coreg to metoprolol succinate 25 mg daily.  4.  Diabetes -A1c 6.2.  Slight scale insulin.  We will transition SGLT2 inhibitor.  5.  Leukocytosis -Procalcitonin negative.  This is trending down.  Suspect this is a heart failure.  Chest x-ray is now clear.  More suggestive of heart failure.  6.  HLD -Stop pravastatin.  On Lipitor.  FEN -No intravenous fluids -Diet n.p.o. for left heart cath and right heart cath -DVT PPx: Heparin drip -Code: Full  For questions or updates, please contact [place] Please consult [email or web address] for contact info under   Time Spent with Patient: I have spent a total of 25 minutes with patient reviewing hospital notes, telemetry, EKGs, labs and examining the patient as well as establishing an assessment and plan that was discussed with the patient.  > 50% of time was spent in direct patient care.    Signed, Addison Naegeli. [name], [place]  03/23/2020 8:41 AM

## 2020-03-23 NOTE — Progress Notes (Signed)
ANTICOAGULATION CONSULT NOTE  Pharmacy Consult for Heparin  Indication: chest pain/ACS  No Known Allergies  Vital Signs: Temp: 97.6 F (36.4 C) (02/28 1010) Temp Source: Oral (02/28 1010) BP: 108/73 (02/28 1010) Pulse Rate: 89 (02/28 1010)  Heparin dosing weight: 96.2 kg  Labs: Recent Labs    03/21/20 0037 03/21/20 0326 03/21/20 0711 03/21/20 1145 03/21/20 1708 03/21/20 1840 03/22/20 0236 03/22/20 0725 03/22/20 1449 03/23/20 0138  HGB 12.3*  --   --   --   --   --  10.7*  --   --  10.3*  HCT 37.6*  --   --   --   --   --  31.5*  --   --  31.4*  PLT 382  --   --   --   --   --  325  --   --  298  HEPARINUNFRC  --   --   --    < >  --  0.33  --  0.49  --  0.35  CREATININE 0.95  --   --   --   --   --  1.04  --  1.18 1.05  TROPONINIHS 551*   < > 1,257*  --  1,389* 1,415*  --   --   --   --    < > = values in this interval not displayed.    Estimated Creatinine Clearance: 74 mL/min (by C-G formula based on SCr of 1.05 mg/dL).   Medical History: Past Medical History:  Diagnosis Date  . Allergic rhinitis   . Arthritis    wrists  . Cardiomyopathy, nonischemic [facility])    followed by cardiology--- dr [name]---  2016 ef 45-50% ,  2016 nuclear ef 37%,  2017 per echo ef 50-55%  . CHF (congestive heart failure), NYHA class III ([place]) 03/21/2020  . GERD (gastroesophageal reflux disease)   . Hiatal hernia   . History of kidney stones   . 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 History of syncope (03-06-2020 pt stated has not had sycopal episode in few years, stated it seems to happen in extreme hot conditions)   cardiologist--- dr [name]--- dx recurrent syncope;  nuclear study 11-03-2014 intermediate risk w/ no ischemia, apical hypokinesis, nuclear ef 37%;  event monitor-- 12-21-2015 SB/ ST  no pauses/ arrythmia's;  echo 08-03-2015 ef 50-55%  . Hyperlipidemia   . Hypertension    followed by pcp   . Hypovitaminosis D   . Mass of left testicle   . Nocturia   . Plantar fasciitis   . Presence of surgical incision    01/ 2022  moh's w/ skin graft of nose, per pt still healing and wear bandage daily  . Type 2 diabetes mellitus ([place])    pt is adament that he is not and have been told he is a diabetic but a borderline;  followed by pcp, in pcp note states DM2 and takes 2 meds daily  . Wears glasses   . Wears hearing aid in both ears     Assessment: 75 y/o M with shortness of breath/epigastric burning. CT Angio negative for PE. Troponin is elevated. Starting heparin. PTA meds reviewed.   Heparin level therapeutic this morning at 0.35. CBC stable overnight. No bleeding per RN.   Cath shows multivessel CAD and surgery team is being consulted.   Goal of  Therapy:  Heparin level 0.3-0.7 units/ml Monitor platelets by anticoagulation protocol: Yes  Plan:  Restart heparin back at previous rate this evening Heparin level in am Follow up surgery recommendations  Please check [email or web address] for unit-specific pharmacy phone numbers.

## 2020-03-23 NOTE — Progress Notes (Signed)
TCTS consulted for CABG evaluation. °

## 2020-03-23 NOTE — Progress Notes (Signed)
Cardiology Progress Note  Patient ID: Roy [name] MRN: [id number] DOB: Apr 06, 1945 Date of Encounter: 03/23/2020  Primary Cardiologist: Ena Dawley, MD  Subjective   Chief Complaint: None.  HPI: N.p.o. for left and right heart catheterization.  No further chest pain.  Still having shortness of breath when he lays flat.  ROS:  All other ROS reviewed and negative. Pertinent positives noted in the HPI.     Inpatient Medications  Scheduled Meds: . aspirin EC  81 mg Oral Daily  . atorvastatin  40 mg Oral QHS  . carvedilol  6.25 mg Oral BID WC  . cholecalciferol  1,000 Units Oral QPM  . insulin aspart  0-15 Units Subcutaneous TID WC  . insulin aspart  0-5 Units Subcutaneous QHS  . lisinopril  10 mg Oral Daily  . pantoprazole  40 mg Oral Daily  . sodium chloride flush  3 mL Intravenous Q12H  . spironolactone  12.5 mg Oral Daily  . tamsulosin  0.4 mg Oral QHS  . vitamin B-12  1,000 mcg Oral Daily   Continuous Infusions: . sodium chloride    . sodium chloride 10 mL/hr at 03/23/20 0603  . heparin 1,550 Units/hr (03/21/20 2050)   PRN Meds: sodium chloride, benzonatate, dextromethorphan-guaiFENesin, sodium chloride flush   Vital Signs   Vitals:   03/22/20 0838 03/22/20 1847 03/22/20 2039 03/23/20 0550  BP: 113/71 117/73 129/90 112/79  Pulse: (!) 110 (!) 105 (!) 105 93  Resp: 18 17 16 19   Temp: (!) 97.5 F (36.4 C) 97.7 F (36.5 C) (!) 97.4 F (36.3 C) 97.6 F (36.4 C)  TempSrc: Oral Oral Oral Oral  SpO2: 95% 95% 95% 98%  Weight:    98.7 kg  Height:        Intake/Output Summary (Last 24 hours) at 03/23/2020 0841 Last data filed at 03/23/2020 0603 Gross per 24 hour  Intake 403.25 ml  Output 500 ml  Net -96.75 ml   Last 3 Weights 03/23/2020 03/21/2020 03/11/2020  Weight (lbs) 217 lb 11.2 oz 212 lb 210 lb 12.8 oz  Weight (kg) 98.748 kg 96.163 kg 95.618 kg      Telemetry  Overnight telemetry shows sinus rhythm heart rate in the 90s, A. tach episode 13 seconds this  morning, which I personally reviewed.   Physical Exam   Vitals:   03/22/20 0838 03/22/20 1847 03/22/20 2039 03/23/20 0550  BP: 113/71 117/73 129/90 112/79  Pulse: (!) 110 (!) 105 (!) 105 93  Resp: 18 17 16 19   Temp: (!) 97.5 F (36.4 C) 97.7 F (36.5 C) (!) 97.4 F (36.3 C) 97.6 F (36.4 C)  TempSrc: Oral Oral Oral Oral  SpO2: 95% 95% 95% 98%  Weight:    98.7 kg  Height:         Intake/Output Summary (Last 24 hours) at 03/23/2020 0841 Last data filed at 03/23/2020 0603 Gross per 24 hour  Intake 403.25 ml  Output 500 ml  Net -96.75 ml    Last 3 Weights 03/23/2020 03/21/2020 03/11/2020  Weight (lbs) 217 lb 11.2 oz 212 lb 210 lb 12.8 oz  Weight (kg) 98.748 kg 96.163 kg 95.618 kg    Body mass index is 28.72 kg/m.   General: Well nourished, well developed, in no acute distress Head: Atraumatic, normal size  Eyes: PEERLA, EOMI  Neck: Supple, no JVD Endocrine: No thryomegaly Cardiac: Normal S1, S2; RRR; no murmurs, rubs, or gallops Lungs: Crackles in the lung bases Abd: Soft, nontender, no hepatomegaly  Ext: No  edema, pulses 2+ Musculoskeletal: No deformities, BUE and BLE strength normal and equal Skin: Warm and dry, no rashes   Neuro: Alert and oriented to person, place, time, and situation, CNII-XII grossly intact, no focal deficits  Psych: Normal mood and affect   Labs  High Sensitivity Troponin:   Recent Labs  Lab 03/21/20 0037 03/21/20 0326 03/21/20 0711 03/21/20 1708 03/21/20 1840  TROPONINIHS 551* 773* 1,257* 1,389* 1,415*     Cardiac EnzymesNo results for input(s): TROPONINI in the last 168 hours. No results for input(s): TROPIPOC in the last 168 hours.  Chemistry Recent Labs  Lab 03/21/20 0037 03/22/20 0236 03/22/20 1449 03/23/20 0138  NA 139 141 138 139  K 4.1 3.0* 3.7 3.5  CL 108 105 105 105  CO2 22 23 23  21*  GLUCOSE 201* 161* 230* 166*  BUN 17 15 18 20   CREATININE 0.95 1.04 1.18 1.05  CALCIUM 9.4 9.0 8.8* 8.7*  PROT 6.4*  --   --   --    ALBUMIN 3.4*  --   --   --   AST 21  --   --   --   ALT 12  --   --   --   ALKPHOS 59  --   --   --   BILITOT 0.8  --   --   --   GFRNONAA >60 >60 >60 >60  ANIONGAP 9 13 10 13     Hematology Recent Labs  Lab 03/21/20 0037 03/22/20 0236 03/23/20 0138  WBC 16.6* 11.4* 12.2*  RBC 4.05* 3.46* 3.39*  HGB 12.3* 10.7* 10.3*  HCT 37.6* 31.5* 31.4*  MCV 92.8 91.0 92.6  MCH 30.4 30.9 30.4  MCHC 32.7 34.0 32.8  RDW 13.7 13.9 13.8  PLT 382 325 298   BNP Recent Labs  Lab 03/21/20 0037  BNP 648.5*    DDimer  Recent Labs  Lab 03/21/20 0037  DDIMER 0.92*     Radiology  DG CHEST PORT 1 VIEW  Result Date: 03/22/2020 CLINICAL DATA:  Shortness of breath. EXAM: PORTABLE CHEST 1 VIEW COMPARISON:  03/21/2020 FINDINGS: Hypoinflation without focal airspace consolidation or effusion. Interval improvement of pulmonary vasculature compatible with near resolution of interstitial edema. Cardiomediastinal silhouette and remainder of the exam is unchanged. IMPRESSION: Interval improvement of pulmonary vascular compatible with near resolution of interstitial edema. Electronically Signed   By: Marin Olp M.D.   On: 03/22/2020 08:46   ECHOCARDIOGRAM COMPLETE  Result Date: 03/22/2020    ECHOCARDIOGRAM REPORT   Patient Name:   Roy [name] Date of Exam: 03/22/2020 Medical Rec #:  [id number]     Height:       73.0 in Accession #:    [id number]    Weight:       212.0 lb Date of Birth:  1945/09/05      BSA:          2.206 m Patient Age:    75 years      BP:           109/72 mmHg Patient Gender: M             HR:           96 bpm. Exam Location:  Inpatient Procedure: 2D Echo, Cardiac Doppler, Color Doppler and Intracardiac            Opacification Agent Indications:    NSTEMI I21.4  History:        Patient has prior history of Echocardiogram examinations,  most                 recent 08/03/2015. CHF and Cardiomyopathy,                 Signs/Symptoms:Syncope; Risk Factors:Diabetes, Dyslipidemia and                  Former Smoker.  Sonographer:    Julia Swaim RDCS Referring Phys: [id number] [place] THOMAS O'NEAL IMPRESSIONS  1. Global hypokinesis with akinesis of the inferior and inferolateral walls; overall severe LV dysfunction.  2. Left ventricular ejection fraction, by estimation, is 25 to 30%. The left ventricle has severely decreased function. The left ventricle demonstrates regional wall motion abnormalities (see scoring diagram/findings for description). The left ventricular internal cavity size was mildly dilated. Left ventricular diastolic parameters are consistent with Grade I diastolic dysfunction (impaired relaxation).  3. Right ventricular systolic function is normal. The right ventricular size is normal. Tricuspid regurgitation signal is inadequate for assessing PA pressure.  4. The mitral valve is normal in structure. Mild mitral valve regurgitation. No evidence of mitral stenosis.  5. The aortic valve is tricuspid. Aortic valve regurgitation is not visualized. Mild aortic valve sclerosis is present, with no evidence of aortic valve stenosis.  6. The inferior vena cava is normal in size with greater than 50% respiratory variability, suggesting right atrial pressure of 3 mmHg. FINDINGS  Left Ventricle: Left ventricular ejection fraction, by estimation, is 25 to 30%. The left ventricle has severely decreased function. The left ventricle demonstrates regional wall motion abnormalities. Definity contrast agent was given IV to delineate the left ventricular endocardial borders. The left ventricular internal cavity size was mildly dilated. There is no left ventricular hypertrophy. Left ventricular diastolic parameters are consistent with Grade I diastolic dysfunction (impaired relaxation). Right Ventricle: The right ventricular size is normal.Right ventricular systolic function is normal. Tricuspid regurgitation signal is inadequate for assessing PA pressure. The tricuspid regurgitant velocity is 1.72 m/s, and with an  assumed right atrial pressure of 3 mmHg, the estimated right ventricular systolic pressure is 14.8 mmHg. Left Atrium: Left atrial size was normal in size. Right Atrium: Right atrial size was normal in size. Pericardium: Trivial pericardial effusion is present. Mitral Valve: The mitral valve is normal in structure. Mild mitral valve regurgitation. No evidence of mitral valve stenosis. Tricuspid Valve: The tricuspid valve is normal in structure. Tricuspid valve regurgitation is trivial. No evidence of tricuspid stenosis. Aortic Valve: The aortic valve is tricuspid. Aortic valve regurgitation is not visualized. Mild aortic valve sclerosis is present, with no evidence of aortic valve stenosis. Pulmonic Valve: The pulmonic valve was not well visualized. Pulmonic valve regurgitation is not visualized. No evidence of pulmonic stenosis. Aorta: The aortic root is normal in size and structure. Venous: The inferior vena cava is normal in size with greater than 50% respiratory variability, suggesting right atrial pressure of 3 mmHg. IAS/Shunts: The interatrial septum was not well visualized. Additional Comments: Global hypokinesis with akinesis of the inferior and inferolateral walls; overall severe LV dysfunction.  LEFT VENTRICLE PLAX 2D LVIDd:         5.60 cm      Diastology LVIDs:         5.20 cm      LV e' medial:   6.31 cm/s LV PW:         0.80 cm      LV E/e' medial: 9.2 LV IVS:        0.80 cm LVOT diam:       2.80 cm LV SV:         61 LV SV Index:   28 LVOT Area:     6.16 cm  LV Volumes (MOD) LV vol d, MOD A4C: 145.0 ml LV vol s, MOD A4C: 118.0 ml LV SV MOD A4C:     145.0 ml RIGHT VENTRICLE RV S prime:     14.50 cm/s TAPSE (M-mode): 2.0 cm LEFT ATRIUM             Index       RIGHT ATRIUM           Index LA diam:        3.60 cm 1.63 cm/m  RA Area:     14.20 cm LA Vol (A2C):   65.8 ml 29.83 ml/m RA Volume:   36.60 ml  16.59 ml/m LA Vol (A4C):   50.7 ml 22.99 ml/m LA Biplane Vol: 60.9 ml 27.61 ml/m  AORTIC VALVE LVOT  Vmax:   55.30 cm/s LVOT Vmean:  39.500 cm/s LVOT VTI:    0.100 m  AORTA Ao Root diam: 3.60 cm Ao Asc diam:  3.50 cm MITRAL VALVE               TRICUSPID VALVE MV Area (PHT): 5.58 cm    TR Peak grad:   11.8 mmHg MV Decel Time: 136 msec    TR Vmax:        172.00 cm/s MV E velocity: 57.80 cm/s MV A velocity: 82.30 cm/s  SHUNTS MV E/A ratio:  0.70        Systemic VTI:  0.10 m                            Systemic Diam: 2.80 cm Kirk Ruths MD Electronically signed by Kirk Ruths MD Signature Date/Time: 03/22/2020/11:12:28 AM    Final     Cardiac Studies  TTE 03/22/2020 1. Global hypokinesis with akinesis of the inferior and inferolateral  walls; overall severe LV dysfunction.  2. Left ventricular ejection fraction, by estimation, is 25 to 30%. The  left ventricle has severely decreased function. The left ventricle  demonstrates regional wall motion abnormalities (see scoring  diagram/findings for description). The left  ventricular internal cavity size was mildly dilated. Left ventricular  diastolic parameters are consistent with Grade I diastolic dysfunction  (impaired relaxation).  3. Right ventricular systolic function is normal. The right ventricular  size is normal. Tricuspid regurgitation signal is inadequate for assessing  PA pressure.  4. The mitral valve is normal in structure. Mild mitral valve  regurgitation. No evidence of mitral stenosis.  5. The aortic valve is tricuspid. Aortic valve regurgitation is not  visualized. Mild aortic valve sclerosis is present, with no evidence of  aortic valve stenosis.  6. The inferior vena cava is normal in size with greater than 50%  respiratory variability, suggesting right atrial pressure of 3 mmHg.   Patient Profile  Roy [name] is a 75 y.o. male with hypertension, diabetes, GERD, nonischemic cardiomyopathy with recovery of EF who was admitted on 03/21/2020 with acute decompensated congestive heart failure and non-STEMI.  Assessment  & Plan   1.  Acute systolic heart failure, EF 25% with regional wall motion normalities -Effectively euvolemic. -Left and right heart cath today. -Stop lisinopril.  Plan for Entresto after washout. -Transition Coreg to metoprolol succinate due to atrial tachycardia.  Continue Aldactone 12.5 mg daily. -Hold diuresis until we see what his LVEDP  is.  He appears euvolemic to me. -We will add an SGLT2 inhibitor as well this admission.  2.  Non-STEMI -Admitted with non-STEMI.  Severe three-vessel calcification seen on coronary CTA.  Continue heparin drip.  On aspirin.  Plan for left heart catheterization today. -On high intensity statin.  3.  Atrial tachycardia -Brief A. tach overnight.  Transition Coreg to metoprolol succinate 25 mg daily.  4.  Diabetes -A1c 6.2.  Slight scale insulin.  We will transition SGLT2 inhibitor.  5.  Leukocytosis -Procalcitonin negative.  This is trending down.  Suspect this is a heart failure.  Chest x-ray is now clear.  More suggestive of heart failure.  6.  HLD -Stop pravastatin.  On Lipitor.  FEN -No intravenous fluids -Diet n.p.o. for left heart cath and right heart cath -DVT PPx: Heparin drip -Code: Full  For questions or updates, please contact [place] Please consult [email or web address] for contact info under   Time Spent with Patient: I have spent a total of 25 minutes with patient reviewing hospital notes, telemetry, EKGs, labs and examining the patient as well as establishing an assessment and plan that was discussed with the patient.  > 50% of time was spent in direct patient care.    Signed, Addison Naegeli. [name], [place]  03/23/2020 8:41 AM

## 2020-03-23 NOTE — Consult Note (Signed)
[address]       [place] [address]             [phone]        Roy [name] [facility] Medical Record #[id number] Date of Birth: 12/29/1945  Referring: [name], MD Primary Care: Jani Gravel, MD Primary Cardiologist:Katarina [name], MD  Chief Complaint:        . 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  Roy [name] noticed acute onset dyspnea after he laid down in his bed on 03/21/2020.  He has some associated epigastric discomfort that he thought was indigestion.  He also had a cough with some pink frothy sputum.  EMS was summoned.  His oxygen saturation was 82% on arrival of the EMS team.  He was placed on nonrebreather and after arrival to the emergency room, his EKG demonstrated sinus tachycardia with PVCs and mild ST depressions in V2 along with nonspecific ST-T wave changes.  His troponin is elevated at 1400.  The cardiology service was consulted and he was ultimately admitted to cardiology with a diagnosis of acute systolic heart failure with non-ST elevation myocardial infarction on 03/21/2020.  CTA was obtained and ruled out pulmonary embolus.  There was, however, demonstration of significant three-vessel coronary calcifications.  Was started on a heparin drip and given aspirin.  He was diuresed resulting in a net output of 2.7 L within 24 hours of admission.  His symptoms improved significantly.  He remained stable.  Left heart catheterization was recommended and carried out earlier today demonstrating severe three-vessel coronary artery disease along with an 80% left main coronary  artery stenosis.  Ejection fraction was estimated 25 to 35%.  Echocardiography has been done this admission that shows global left ventricular hypokinesis.  Ejection fraction is estimated 25 to 30% by echo.  There were no significant valve lesions. Currently, Roy [name] denies any chest pain or shortness of breath.  His oxygen saturation is 100% on room air. 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 CT surgery has been asked to evaluate Roy [name] for consideration of surgical coronary revascularization for severe three-vessel coronary artery disease in this gentleman presenting with acute systolic heart failure.     Current Activity/ Functional Status:   Zubrod Score: At the time of surgery this patient's most appropriate activity status/level should be described as: []     0    Normal activity, no symptoms []     1    Restricted in physical strenuous activity but ambulatory, able to do out light work [x]     2    Ambulatory and capable of self care, unable to do work activities, up and about                 more than 50%  Of the time                            []     3  Only limited self care, in bed greater than 50% of waking hours []     4    Completely disabled, no self care, confined to bed or chair []     5    Moribund  Past Medical History:  Diagnosis Date  . Allergic rhinitis   . Arthritis    wrists  . Cardiomyopathy, nonischemic [facility])    followed by cardiology--- dr [name]---  2016 ef 45-50% ,  2016 nuclear ef 37%,  2017 per echo ef 50-55%  . CHF (congestive heart failure), NYHA class III ([place]) 03/21/2020  . GERD (gastroesophageal reflux disease)   . Hiatal hernia   . History of kidney stones   . 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 History of syncope (03-06-2020  pt stated has not had sycopal episode in few years, stated it seems to happen in extreme hot conditions)   cardiologist--- dr [name]--- dx recurrent syncope;  nuclear study 11-03-2014 intermediate risk w/ no ischemia, apical hypokinesis, nuclear ef 37%;  event monitor-- 12-21-2015 SB/ ST  no pauses/ arrythmia's;  echo 08-03-2015 ef 50-55%  . Hyperlipidemia   . Hypertension    followed by pcp  . Hypovitaminosis D   . Mass of left testicle   . Nocturia   . Plantar fasciitis   . Presence of surgical incision    01/ 2022  moh's w/ skin graft of nose, per pt still healing and wear bandage daily  . Type 2 diabetes mellitus ([place])    pt is adament that he is not and have been told he is a diabetic but a borderline;  followed by pcp, in pcp note states DM2 and takes 2 meds daily  . Wears glasses   . Wears hearing aid in both ears     Past Surgical History:  Procedure Laterality Date  . COLONOSCOPY  last one 01-30-2017  . LOW ANTERIOR RESECTION RECTUM W/ COLOPROCTOSTOMY  05/2003  . MOHS SURGERY  01/2020   nose w/ graft  . ORCHIECTOMY Left 03/11/2020   Procedure: [place] [facility];  Surgeon: [name], MD;  Location: [facility];  Service: Urology;  Laterality: Left;  ONLY NEEDS 60 MIN  . SHOULDER SURGERY Right 1992; 07/ 2021  . 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 TONSILLECTOMY AND ADENOIDECTOMY  child  . UPPER GASTROINTESTINAL ENDOSCOPY  last one 06-06-2017    Social History   Tobacco Use  Smoking Status Former Smoker  . Years: 5.00  . Types: Pipe, Cigars  . Quit date: 11/29/1976  . Years since quitting: 43.3  Smokeless Tobacco Never Used    Social History   Substance and Sexual Activity  Alcohol Use Yes  . Alcohol/week: 0.0 standard drinks   Comment: Occasional drink      No Known Allergies  Current Facility-Administered Medications  Medication Dose Route Frequency Provider Last Rate  Last Admin  . 0.9 %  sodium chloride infusion  250 mL Intravenous PRN [name], MD      . acetaminophen (TYLENOL) tablet 650 mg  650 mg Oral Q4H PRN [name], MD      . aspirin EC tablet 81 mg  81 mg Oral Daily [name], MD   81 mg at 03/23/20 0816  . atorvastatin (LIPITOR) tablet 40 mg  40  mg Oral QHS [name], MD   40 mg at 03/22/20 2243  . benzonatate (TESSALON) capsule 100 mg  100 mg Oral TID PRN [name], MD   100 mg at 03/23/20 0141  . cholecalciferol (VITAMIN D3) tablet 1,000 Units  1,000 Units Oral QPM [name], MD   1,000 Units at 03/22/20 1846  . dextromethorphan-guaiFENesin (MUCINEX DM) 30-600 MG per 12 hr tablet 1 tablet  1 tablet Oral BID PRN [name], MD   1 tablet at 03/22/20 2251  . hydrALAZINE (APRESOLINE) injection 10 mg  10 mg Intravenous Q20 Min PRN Larae Grooms S, MD      . insulin aspart (novoLOG) injection 0-15 Units  0-15 Units Subcutaneous TID WC [name], MD   3 Units at 03/22/20 1847  . insulin aspart (novoLOG) injection 0-5 Units  0-5 Units Subcutaneous QHS Larae Grooms S, MD      . labetalol (NORMODYNE) injection 10 mg  10 mg Intravenous Q10 min PRN [name], MD      . metoprolol succinate (TOPROL-XL) 24 hr tablet 25 mg  25 mg Oral Daily [name], MD      . ondansetron [facility]) injection 4 mg  4 mg Intravenous Q6H PRN [name], MD      . pantoprazole (PROTONIX) EC tablet 40 mg  40 mg Oral Daily [name], MD   40 mg at 03/22/20 0845  . sodium chloride flush (NS) 0.9 % injection 3 mL  3 mL Intravenous Q12H [name], MD   3 mL at 03/22/20 1830  . sodium chloride flush (NS) 0.9 % injection 3 mL  3 mL Intravenous Q12H [name], MD   3 mL at 03/23/20 1029  . sodium chloride flush (NS) 0.9 % injection 3 mL  3 mL Intravenous PRN [name], MD      . spironolactone (ALDACTONE) tablet 12.5 mg   12.5 mg Oral Daily [name], MD   12.5 mg at 03/22/20 0844  . tamsulosin (FLOMAX) capsule 0.4 mg  0.4 mg Oral QHS [name], MD   0.4 mg at 03/22/20 2243  . vitamin B-12 (CYANOCOBALAMIN) tablet 1,000 mcg  1,000 mcg Oral Daily [name], MD   1,000 mcg at 03/22/20 9509    Medications Prior to Admission  Medication Sig Dispense Refill Last Dose  . amLODipine (NORVASC) 10 MG tablet Take 1 tablet (10 mg total) by mouth daily. (Patient taking differently: Take 10 mg by mouth at bedtime.) 90 tablet 1 03/20/2020 at Unknown time  . Apoaequorin (PREVAGEN PO) Take 1 tablet by mouth at bedtime.   03/20/2020 at Unknown time  . aspirin 81 MG tablet as directed. Take 1 tablet by mouth 5 times weekly.   03/20/2020  . carvedilol (COREG) 6.25 MG tablet Take 1 tablet (6.25 mg total) by mouth 2 (two) times daily. 180 tablet 1 03/20/2020 at 2145  . cholecalciferol (VITAMIN D) 1000 UNITS tablet Take 1,000 Units by mouth See admin instructions. Mon-friday   03/20/2020 at Unknown time  . glucosamine-chondroitin 500-400 MG tablet Take 1 tablet by mouth. 5 times weekly   03/20/2020 at Unknown time  . HYDROcodone-acetaminophen (NORCO) 5-325 MG tablet Take 1 tablet by mouth every 4 (four) hours as needed for moderate pain. 20 tablet 0 never  . lisinopril (ZESTRIL) 40 MG tablet Take 1 tablet (40 mg total) by mouth daily. (Patient taking differently: Take 40 mg by mouth  at bedtime.) 90 tablet 1 03/20/2020 at Unknown time  . metFORMIN (GLUCOPHAGE) 1000 MG tablet Take 1,000 mg by mouth 2 (two) times daily with a meal.    03/20/2020 at Unknown time  . Multiple Vitamin (MULTIVITAMIN WITH MINERALS) TABS tablet Take 1 tablet by mouth. 5 times weekly   03/20/2020 at Unknown time  . omeprazole (PRILOSEC) 20 MG capsule Take 20 mg by mouth daily.   03/20/2020 at Unknown time  . pioglitazone (ACTOS) 30 MG tablet Take 30 mg by mouth daily.   03/20/2020 at Unknown time  . pravastatin (PRAVACHOL) 80 MG tablet Take 1  tablet (80 mg total) by mouth daily. (Patient taking differently: Take 80 mg by mouth at bedtime.) 90 tablet 1 03/20/2020 at Unknown time  . tamsulosin (FLOMAX) 0.4 MG CAPS capsule Take 0.4 mg by mouth at bedtime.   03/20/2020 at Unknown time  . UNABLE TO FIND Take 1 tablet by mouth 2 (two) times a day. Study cholesterol drug if drug name needed please call April McCaskill at [phone]   03/20/2020 at Unknown time  . vitamin B-12 (CYANOCOBALAMIN) 1000 MCG tablet Take 1,000 mcg by mouth. 5 times a week   03/20/2020 at Unknown time    Family History  Problem Relation Age of Onset  . Heart attack Mother   . Stroke Father   . Colon cancer Neg Hx   . Esophageal cancer Neg Hx   . Pancreatic cancer Neg Hx   . Rectal cancer Neg Hx   . Stomach cancer Neg Hx   . Colon polyps Neg Hx      Review of Systems:   ROS     Cardiac Review of Systems: Y or  [    ]= no  Chest Pain [    ]  Resting SOB [ x  ] Exertional SOB  [  ]  Orthopnea [  ]   Pedal Edema [   ]    Palpitations [  ] Syncope  [  ]   Presyncope [   ]  General Review of Systems: [Y] = yes [  ]=no Constitional: recent weight change [  ]; anorexia [  ]; fatigue [  ]; nausea [  ]; night sweats [  ]; fever [  ]; or chills [  ]                                                                Eye : blurred vision [  ]; diplopia [   ]; vision changes [  ];  Amaurosis fugax[  ]; Resp: cough [  ];  wheezing[  ];  hemoptysis[ x ]; shortness of breath[ x ]; paroxysmal nocturnal dyspnea[  ]; dyspnea on exertion[  ]; or orthopnea[ x ];  GI:  gallstones[  ], vomiting[  ];  dysphagia[  ]; melena[  ];  hematochezia [  ]; heartburn[  ];   Hx of  Colonoscopy[  ]; GU: kidney stones [  ]; hematuria[  ];   dysuria [  ];  nocturia[  ];  history of     obstruction [  ]; urinary frequency [  ]             Skin: rash, swelling[  ];, hair loss[  ];  peripheral edema[  ];  or itching[  ]; Musculosketetal: myalgias[  ];  joint swelling[  ];  joint erythema[  ];  joint  pain[  ];  back pain[  ];  Heme/Lymph: bruising[  ];  bleeding[  ];  anemia[  ];  Neuro: TIA[  ];  headaches[  ];  stroke[  ];  vertigo[  ];  seizures[  ];   paresthesias[  ];  difficulty walking[  ];  Psych:depression[  ]; anxiety[  ];  Endocrine: diabetes[ x ];  thyroid dysfunction[  ];             Physical Exam: BP 108/73 (BP Location: Left Arm)   Pulse 89   Temp 97.6 F (36.4 C) (Oral)   Resp 16   Ht 6\' 1"  (1.854 m)   Wt 98.7 kg   SpO2 95%   BMI 28.72 kg/m    General appearance: alert, cooperative and no distress Head: Normocephalic, without obvious abnormality, atraumatic Neck: no adenopathy, no carotid bruit, no JVD, supple, symmetrical, trachea midline and thyroid not enlarged, symmetric, no tenderness/mass/nodules Lymph nodes: Cervical, supraclavicular, and axillary nodes normal. and No cervical or clavicular adenopathy Resp: Breath sounds are clear to auscultation bilaterally.  Oxygen saturation was 100% on room air.  He had no increased work of breathing. Cardio: Regular rate and rhythm, no murmur or gallop noted. GI: Soft and nontender.  There is a healing left inguinal incision.  Well approximated with Dermabond on the surface. Extremities: All are well perfused with palpable distal pulses.  Modified Allen's test was performed with pulse oximetry.  There was good waveform with bilateral radial artery compression. Neurologic: Grossly normal  Diagnostic Studies & Laboratory data:  RIGHT/LEFT HEART CATH AND CORONARY ANGIOGRAPHY    Conclusion    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  Prox RCA-2 lesion is 50% stenosed.  Dist RCA lesion is 50% stenosed.  Ost Cx lesion is 90% stenosed.  Mid LM lesion is 80% stenosed.  Ost LAD lesion is 75% stenosed.  Mid Cx to Dist Cx lesion is 75% stenosed.  2nd Mrg lesion is 75% stenosed.  2nd Diag lesion is 75% stenosed.  Mid LAD-1 lesion is 75% stenosed.  Mid LAD-2 lesion is 75% stenosed.  There is severe left  ventricular systolic dysfunction.  The left ventricular ejection fraction is 25-35% by visual estimate.  LV end diastolic pressure is normal.  There is no aortic valve stenosis.  Ao sat 94%, PA sat 66%, PA pressure 27/6, mean PA 15 mm Hg; mean PCWP 8 mm Hg, CO 7.5 L/min; CI 3.3. Normal right heart pressures.   Severe, calcific multivessel disease. Cardiac surgery consult.    Results conveyed to his wife by phone, and to Dr. [name].    Coronary Findings   Diagnostic Dominance: Right  Left Main  Mid LM lesion is 80% stenosed. The lesion is severely calcified.  Left Anterior Descending  There is mild diffuse disease throughout the vessel.  Ost LAD lesion is 75% stenosed.  Mid LAD-1 lesion is 75% stenosed.  Mid LAD-2 lesion is 75% stenosed.  Second Diagonal Branch  Vessel is small in size.  2nd Diag lesion is 75% stenosed.  Left Circumflex  Ost Cx lesion is 90% stenosed. The lesion is severely calcified.  Mid Cx to Dist Cx lesion is 75% stenosed.  Second Obtuse Marginal Branch  2nd Mrg lesion is 75% stenosed.  Right Coronary Artery  There is mild diffuse disease throughout the vessel.  Prox RCA-1  lesion is 75% stenosed.  Prox RCA-2 lesion is 50% stenosed.  Dist RCA lesion is 50% stenosed.   Intervention   No interventions have been documented.  Right Heart  Right Heart Pressures Ao sat 94%, PA sat 66%, PA pressure 27/6, mean PA 15 mm Hg; mean PCWP 8 mm Hg, CO 7.5 L/min; CI 3.3   Wall Motion  Resting                Left Heart  Left Ventricle The left ventricle is mildly dilated. 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 LV end diastolic pressure is normal. The left ventricular ejection fraction is 25-35% by visual estimate. There are LV function abnormalities due to global hypokinesis.  Aortic Valve There is no aortic valve stenosis.   Coronary Diagrams   Diagnostic Dominance: Right      ECHOCARDIOGRAM REPORT       Patient  Name:  Roy [name] Date of Exam: 03/22/2020  Medical Rec #: [id number]   Height:    73.0 in  Accession #:  [id number]  Weight:    212.0 lb  Date of Birth: [birth year]   BSA:     2.206 m  Patient Age:  1 years   BP:      109/72 mmHg  Patient Gender: M       HR:      96 bpm.  Exam Location: Inpatient   Procedure: 2D Echo, Cardiac Doppler, Color Doppler and Intracardiac       Opacification Agent   Indications:  NSTEMI I21.4    History:    Patient has prior history of Echocardiogram examinations,  most         recent 08/03/2015. CHF and Cardiomyopathy,         Signs/Symptoms:Syncope; Risk Factors:Diabetes,  Dyslipidemia and         Former Smoker.    Sonographer:  Vickie Epley RDCS  Referring Phys: [id number] [place]    1. Global hypokinesis with akinesis of the inferior and inferolateral  walls; overall severe LV dysfunction.  2. Left ventricular ejection fraction, by estimation, is 25 to 30%. The  left ventricle has severely decreased function. The left ventricle  demonstrates regional wall motion abnormalities (see scoring  diagram/findings for description). The left  ventricular internal cavity size was mildly dilated. Left ventricular  diastolic parameters are consistent with Grade I diastolic dysfunction  (impaired relaxation).  3. Right ventricular systolic function is normal. The right ventricular  size is normal. Tricuspid regurgitation signal is inadequate for assessing  PA pressure.  4. The mitral valve is normal in structure. Mild mitral valve  regurgitation. No evidence of mitral stenosis.  5. The aortic valve is tricuspid. Aortic valve regurgitation is not  visualized. Mild aortic valve sclerosis is present, with no evidence of  aortic valve stenosis.  6. The inferior vena cava is normal in size with greater than 50%  respiratory variability,  suggesting right atrial pressure of 3 mmHg.   FINDINGS  Left Ventricle: Left ventricular ejection fraction, by estimation, is 25  to 30%. The left ventricle has severely decreased function. The left  ventricle demonstrates regional wall motion abnormalities. Definity  contrast agent was given IV to delineate  the left ventricular endocardial borders. The left ventricular internal  cavity size was mildly dilated. There is no left ventricular hypertrophy.  Left ventricular diastolic parameters are consistent with Grade I  diastolic dysfunction (impaired  relaxation).   Right Ventricle: The right  ventricular size is normal.Right ventricular  systolic function is normal. Tricuspid regurgitation signal is inadequate  for assessing PA pressure. The tricuspid regurgitant velocity is 1.72 m/s,  and with an assumed right atrial  pressure of 3 mmHg, the estimated right ventricular systolic pressure is  27.7 mmHg.   Left Atrium: Left atrial size was normal in size.   Right Atrium: Right atrial size was normal in size.   Pericardium: Trivial pericardial effusion is present.   Mitral Valve: The mitral valve is normal in structure. Mild mitral valve  regurgitation. No evidence of mitral valve stenosis.   Tricuspid Valve: The tricuspid valve is normal in structure. Tricuspid  valve regurgitation is trivial. No evidence of tricuspid stenosis.   Aortic Valve: The aortic valve is tricuspid. Aortic valve regurgitation is  not visualized. Mild aortic valve sclerosis is present, with no evidence  of aortic valve stenosis.   Pulmonic Valve: The pulmonic valve was not well visualized. Pulmonic valve  regurgitation is not visualized. No evidence of pulmonic stenosis.   Aorta: The aortic root is normal in size and structure.   Venous: The inferior vena cava is normal in size with greater than 50%  respiratory variability, suggesting right atrial pressure of 3 mmHg.   IAS/Shunts: The interatrial  septum was not well visualized.   Additional Comments: Global hypokinesis with akinesis of the inferior and  inferolateral walls; overall severe LV dysfunction.     LEFT VENTRICLE  PLAX 2D  LVIDd:     5.60 cm   Diastology  LVIDs:     5.20 cm   LV e' medial:  6.31 cm/s  LV PW:     0.80 cm   LV E/e' medial: 9.2  LV IVS:    0.80 cm  LVOT diam:   2.80 cm  LV SV:     61  LV SV Index:  28  LVOT Area:   6.16 cm    LV Volumes (MOD)  LV vol d, MOD A4C: 145.0 ml  LV vol s, MOD A4C: 118.0 ml  LV SV MOD A4C:   145.0 ml   RIGHT VENTRICLE  RV S prime:   14.50 cm/s  TAPSE (M-mode): 2.0 cm   LEFT ATRIUM       Index    RIGHT ATRIUM      Index  LA diam:    3.60 cm 1.63 cm/m RA Area:   14.20 cm  LA Vol (A2C):  65.8 ml 29.83 ml/m RA Volume:  36.60 ml 16.59 ml/m  LA Vol (A4C):  50.7 ml 22.99 ml/m  LA Biplane Vol: 60.9 ml 27.61 ml/m  AORTIC VALVE  LVOT Vmax:  55.30 cm/s  LVOT Vmean: 39.500 cm/s  LVOT VTI:  0.100 m    AORTA  Ao Root diam: 3.60 cm  Ao Asc diam: 3.50 cm   MITRAL VALVE        TRICUSPID VALVE  MV Area (PHT): 5.58 cm  TR Peak grad:  11.8 mmHg  MV Decel Time: 136 msec  TR Vmax:    172.00 cm/s  MV E velocity: 57.80 cm/s  MV A velocity: 82.30 cm/s SHUNTS  MV E/A ratio: 0.70    Systemic VTI: 0.10 m               Systemic Diam: 2.80 cm   Kirk Ruths MD  Electronically signed by Kirk Ruths MD  Signature Date/Time: 03/22/2020/11:12:28 AM       Recent Radiology Findings:     DG CHEST  PORT 1 VIEW  Result Date: 03/22/2020 CLINICAL DATA:  Shortness of breath. EXAM: PORTABLE CHEST 1 VIEW COMPARISON:  03/21/2020 FINDINGS: Hypoinflation without focal airspace consolidation or effusion. Interval improvement of pulmonary vasculature compatible with near resolution of interstitial edema. Cardiomediastinal silhouette and remainder of the exam is unchanged.  IMPRESSION: Interval improvement of pulmonary vascular compatible with near resolution of interstitial edema. Electronically Signed   By: Marin Olp M.D.   On: 03/22/2020 08:46      I have independently reviewed the above radiologic studies and discussed with the patient   Recent Lab Findings: Lab Results  Component Value Date   WBC 12.2 (H) 03/23/2020   HGB 10.3 (L) 03/23/2020   HCT 31.4 (L) 03/23/2020   PLT 298 03/23/2020   GLUCOSE 166 (H) 03/23/2020   CHOL 140 03/21/2020   TRIG 148 03/21/2020   HDL 36 (L) 03/21/2020   LDLCALC 74 03/21/2020   ALT 12 03/21/2020   AST 21 03/21/2020   NA 139 03/23/2020   K 3.5 03/23/2020   CL 105 03/23/2020   CREATININE 1.05 03/23/2020   BUN 20 03/23/2020   CO2 21 (L) 03/23/2020   INR 1.07 09/27/2015   HGBA1C 6.2 (H) 03/21/2020      Assessment / Plan:     -80% left main coronary artery stenosis and severe three-vessel coronary artery disease in a 75 year old gentleman presenting with acute NSTEMI and acute systolic heart failure.  Ejection fraction is 25 to 30% and he has no significant valvular dysfunction.  Proximal lesions are heavily calcified.  Coronary bypass grafting will be his best option for revascularization.  The general nature of this procedure, the expected perioperative progress,  And post-operative recovery were all discussed with Roy [name] and his wife and brother-in-law who are in attendance today.  He would like for [place] to proceed with plans for surgery later this week.  -Type 2 diabetes mellitus-admission hemoglobin A1c 6.2  -History of hypertension  -History of dyslipidemia  -B-cell lymphoma identified following left orchiectomy 2 weeks ago for chronically swollen left testicle.  Referral has been made to oncology but he is not yet evaluated by that service.  -Squamous cell carcinoma-Roy [name] had a lesion excised from his nose about 1 month ago with subsequent skin grafting.  This is healing appropriately.  He has been  seen in follow-up by his dermatologist and "released".  -Roy [name] is status post right rotator cuff surgery in July of last year.  He continues to have some limitation in range of motion in his right shoulder has been working on this with therapy.  I  spent 30 minutes counseling the patient face to face.   Antony Odea, PA-C  03/23/2020 11:38 AM

## 2020-03-24 DIAGNOSIS — I214 Non-ST elevation (NSTEMI) myocardial infarction: Secondary | ICD-10-CM | POA: Diagnosis not present

## 2020-03-24 LAB — BASIC METABOLIC PANEL
Anion gap: 12 (ref 5–15)
BUN: 14 mg/dL (ref 8–23)
CO2: 20 mmol/L — ABNORMAL LOW (ref 22–32)
Calcium: 8.6 mg/dL — ABNORMAL LOW (ref 8.9–10.3)
Chloride: 107 mmol/L (ref 98–111)
Creatinine, Ser: 0.88 mg/dL (ref 0.61–1.24)
GFR, Estimated: >60 mL/min (ref >60)
Glucose, Bld: 144 mg/dL — ABNORMAL HIGH (ref 70–99)
Potassium: 3.5 mmol/L (ref 3.5–5.1)
Sodium: 139 mmol/L (ref 135–145)

## 2020-03-24 LAB — CBC
HCT: 32.1 % — ABNORMAL LOW (ref 39.0–52.0)
Hemoglobin: 10.9 g/dL — ABNORMAL LOW (ref 13.0–17.0)
MCH: 30.8 pg (ref 26.0–34.0)
MCHC: 34 g/dL (ref 30.0–36.0)
MCV: 90.7 fL (ref 80.0–100.0)
Platelets: 304 10*3/uL (ref 150–400)
RBC: 3.54 MIL/uL — ABNORMAL LOW (ref 4.22–5.81)
RDW: 13.7 % (ref 11.5–15.5)
WBC: 15.1 10*3/uL — ABNORMAL HIGH (ref 4.0–10.5)
nRBC: 0 % (ref 0.0–0.2)

## 2020-03-24 LAB — GLUCOSE, CAPILLARY
Glucose-Capillary: 175 mg/dL — ABNORMAL HIGH (ref 70–99)
Glucose-Capillary: 225 mg/dL — ABNORMAL HIGH (ref 70–99)
Glucose-Capillary: 170 mg/dL — ABNORMAL HIGH (ref 70–99)
Glucose-Capillary: 141 mg/dL — ABNORMAL HIGH (ref 70–99)

## 2020-03-24 LAB — HEPARIN LEVEL (UNFRACTIONATED): Heparin Unfractionated: 0.3 IU/mL (ref 0.30–0.70)

## 2020-03-24 LAB — ABO/RH: ABO/RH(D): A NEG

## 2020-03-24 MED ORDER — LOSARTAN POTASSIUM 25 MG PO TABS: 12.5000 mg | ORAL_TABLET | Freq: Once | ORAL | Status: DC

## 2020-03-24 MED ORDER — PHENYLEPHRINE HCL-NACL 20-0.9 MG/250ML-% IV SOLN
30.0000 ug/min | INTRAVENOUS | Status: AC
  Administered 2020-03-25: 40 ug/min via INTRAVENOUS
  Filled 2020-03-24: qty 250

## 2020-03-24 MED ORDER — VANCOMYCIN HCL 1500 MG/300ML IV SOLN
1500.0000 mg | INTRAVENOUS | Status: AC
  Administered 2020-03-25: 1500 mg via INTRAVENOUS
  Filled 2020-03-24: qty 300

## 2020-03-24 MED ORDER — SODIUM CHLORIDE 0.9 % IV SOLN
1.5000 g | INTRAVENOUS | Status: AC
  Administered 2020-03-25: 1.5 g via INTRAVENOUS
  Filled 2020-03-24: qty 1.5

## 2020-03-24 MED ORDER — METOPROLOL TARTRATE 12.5 MG HALF TABLET
12.5000 mg | ORAL_TABLET | Freq: Once | ORAL | Status: AC
  Administered 2020-03-25: 12.5 mg via ORAL
  Filled 2020-03-24: qty 1

## 2020-03-24 MED ORDER — TRANEXAMIC ACID (OHS) PUMP PRIME SOLUTION
2.0000 mg/kg | INTRAVENOUS | Status: DC
  Filled 2020-03-24: qty 1.95

## 2020-03-24 MED ORDER — LOSARTAN POTASSIUM 25 MG PO TABS
12.5000 mg | ORAL_TABLET | Freq: Every day | ORAL | Status: DC
  Administered 2020-03-24: 12.5 mg via ORAL
  Filled 2020-03-24: qty 1

## 2020-03-24 MED ORDER — CHLORHEXIDINE GLUCONATE 0.12 % MT SOLN
15.0000 mL | Freq: Once | OROMUCOSAL | Status: AC
  Administered 2020-03-25: 15 mL via OROMUCOSAL
  Filled 2020-03-24: qty 15

## 2020-03-24 MED ORDER — CHLORHEXIDINE GLUCONATE CLOTH 2 % EX PADS
6.0000 | MEDICATED_PAD | Freq: Once | CUTANEOUS | Status: AC
  Administered 2020-03-24: 6 via TOPICAL

## 2020-03-24 MED ORDER — NITROGLYCERIN IN D5W 200-5 MCG/ML-% IV SOLN
2.0000 ug/min | INTRAVENOUS | Status: DC
  Filled 2020-03-24: qty 250

## 2020-03-24 MED ORDER — MAGNESIUM SULFATE 50 % IJ SOLN
40.0000 meq | INTRAMUSCULAR | Status: DC
  Filled 2020-03-24: qty 9.85

## 2020-03-24 MED ORDER — BISACODYL 5 MG PO TBEC
5.0000 mg | DELAYED_RELEASE_TABLET | Freq: Once | ORAL | Status: AC
  Administered 2020-03-24: 5 mg via ORAL
  Filled 2020-03-24: qty 1

## 2020-03-24 MED ORDER — TEMAZEPAM 15 MG PO CAPS: 15.0000 mg | ORAL_CAPSULE | Freq: Once | ORAL | Status: DC | PRN

## 2020-03-24 MED ORDER — DEXMEDETOMIDINE HCL IN NACL 400 MCG/100ML IV SOLN
0.1000 ug/kg/h | INTRAVENOUS | Status: AC
  Administered 2020-03-25: .3 ug/kg/h via INTRAVENOUS
  Filled 2020-03-24: qty 100

## 2020-03-24 MED ORDER — TRANEXAMIC ACID (OHS) BOLUS VIA INFUSION
15.0000 mg/kg | INTRAVENOUS | Status: AC
  Administered 2020-03-25: 1464 mg via INTRAVENOUS
  Filled 2020-03-24: qty 1464

## 2020-03-24 MED ORDER — INSULIN REGULAR(HUMAN) IN NACL 100-0.9 UT/100ML-% IV SOLN
INTRAVENOUS | Status: AC
  Administered 2020-03-25: 6 [IU]/h via INTRAVENOUS
  Filled 2020-03-24: qty 100

## 2020-03-24 MED ORDER — SODIUM CHLORIDE 0.9 % IV SOLN
INTRAVENOUS | Status: DC
  Filled 2020-03-24: qty 30

## 2020-03-24 MED ORDER — PLASMA-LYTE 148 IV SOLN
INTRAVENOUS | Status: DC
  Filled 2020-03-24: qty 2.5

## 2020-03-24 MED ORDER — NOREPINEPHRINE 4 MG/250ML-% IV SOLN
0.0000 ug/min | INTRAVENOUS | Status: AC
  Administered 2020-03-25: 2 ug/min via INTRAVENOUS
  Filled 2020-03-24: qty 250

## 2020-03-24 MED ORDER — POTASSIUM CHLORIDE 2 MEQ/ML IV SOLN
80.0000 meq | INTRAVENOUS | Status: DC
  Filled 2020-03-24: qty 40

## 2020-03-24 MED ORDER — POTASSIUM CHLORIDE CRYS ER 20 MEQ PO TBCR
40.0000 meq | EXTENDED_RELEASE_TABLET | Freq: Once | ORAL | Status: AC
  Administered 2020-03-24: 40 meq via ORAL
  Filled 2020-03-24: qty 2

## 2020-03-24 MED ORDER — SODIUM CHLORIDE 0.9 % IV SOLN
750.0000 mg | INTRAVENOUS | Status: AC
  Administered 2020-03-25: 750 mg via INTRAVENOUS
  Filled 2020-03-24: qty 750

## 2020-03-24 MED ORDER — EPINEPHRINE HCL 5 MG/250ML IV SOLN IN NS
0.0000 ug/min | INTRAVENOUS | Status: AC
  Administered 2020-03-25: 11:00:00 3 ug/min via INTRAVENOUS
  Filled 2020-03-24: qty 250

## 2020-03-24 MED ORDER — METOPROLOL SUCCINATE ER 50 MG PO TB24: 50.0000 mg | ORAL_TABLET | Freq: Every day | ORAL | Status: DC

## 2020-03-24 MED ORDER — TRANEXAMIC ACID 1000 MG/10ML IV SOLN
1.5000 mg/kg/h | INTRAVENOUS | Status: AC
  Administered 2020-03-25: 1.5 mg/kg/h via INTRAVENOUS
  Filled 2020-03-24: qty 25

## 2020-03-24 MED ORDER — MILRINONE LACTATE IN DEXTROSE 20-5 MG/100ML-% IV SOLN
0.3000 ug/kg/min | INTRAVENOUS | Status: DC
  Filled 2020-03-24: qty 100

## 2020-03-24 MED ORDER — SACUBITRIL-VALSARTAN 24-26 MG PO TABS: 1.0000 | ORAL_TABLET | Freq: Two times a day (BID) | ORAL | Status: DC

## 2020-03-24 MED ORDER — FUROSEMIDE 10 MG/ML IJ SOLN
20.0000 mg | Freq: Once | INTRAMUSCULAR | Status: AC
  Administered 2020-03-24: 20 mg via INTRAVENOUS
  Filled 2020-03-24: qty 2

## 2020-03-24 NOTE — Anesthesia Preprocedure Evaluation (Addendum)
Anesthesia Evaluation  Patient identified by MRN, date of birth, ID band Patient awake    Reviewed: Allergy & Precautions, NPO status , Patient's Chart, lab work & pertinent test results  Airway Mallampati: II  TM Distance: >3 FB Neck ROM: Full    Dental  (+) Teeth Intact   Pulmonary shortness of breath and with exertion, former smoker,    Pulmonary exam normal        Cardiovascular hypertension, Pt. on medications and Pt. on home beta blockers + Past MI and +CHF  III Rhythm:Regular Rate:Normal     Neuro/Psych negative neurological ROS  negative psych ROS   GI/Hepatic Neg liver ROS, hiatal hernia, GERD  Medicated,  Endo/Other  diabetes, Type 2, Oral Hypoglycemic Agents  Renal/GU Renal disease  negative genitourinary   Musculoskeletal  (+) Arthritis , Osteoarthritis,    Abdominal (+)  Abdomen: soft. Bowel sounds: normal.  Peds  Hematology   Anesthesia Other Findings   Reproductive/Obstetrics                            Anesthesia Physical Anesthesia Plan  ASA: IV  Anesthesia Plan: General   Post-op Pain Management:    Induction: Intravenous  PONV Risk Score and Plan: 2 and Ondansetron, Dexamethasone, Midazolam and Treatment may vary due to age or medical condition  Airway Management Planned: Mask and Oral ETT  Additional Equipment: Arterial line, CVP, PA Cath, TEE, 3D TEE and Ultrasound Guidance Line Placement  Intra-op Plan:   Post-operative Plan: Post-operative intubation/ventilation  Informed Consent: I have reviewed the patients History and Physical, chart, labs and discussed the procedure including the risks, benefits and alternatives for the proposed anesthesia with the patient or authorized representative who has indicated his/her understanding and acceptance.     Dental advisory given  Plan Discussed with: CRNA  Anesthesia Plan Comments: (Lab Results       Component                Value               Date                      WBC                      15.1 (H)            03/24/2020                HGB                      10.9 (L)            03/24/2020                HCT                      32.1 (L)            03/24/2020                MCV                      90.7                03/24/2020                PLT  304                 03/24/2020           Lab Results      Component                Value               Date                      NA                       139                 03/24/2020                K                        3.5                 03/24/2020                CO2                      20 (L)              03/24/2020                GLUCOSE                  144 (H)             03/24/2020                BUN                      14                  03/24/2020                CREATININE               0.88                03/24/2020                CALCIUM                  8.6 (L)             03/24/2020                GFRNONAA                 >60                 03/24/2020                GFRAA                    >60                 09/27/2015            CATH 03/24/20: 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 Prox RCA-2 lesion is 50% stenosed. Dist RCA lesion is 50% stenosed. Ost Cx lesion is 90% stenosed. Mid LM lesion is 80% stenosed. Ost LAD lesion is 75% stenosed. Mid Cx to Dist Cx lesion is 75% stenosed. 2nd  Mrg lesion is 75% stenosed. 2nd Diag lesion is 75% stenosed. Mid LAD-1 lesion is 75% stenosed. Mid LAD-2 lesion is 75% stenosed. 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 The left ventricular ejection fraction is 25-35% by visual estimate. LV end diastolic pressure is normal. There is no aortic valve stenosis. Ao sat 94%, PA sat 66%, PA pressure 27/6, mean PA 15 mm Hg; mean PCWP 8 mm Hg, CO 7.5 L/min; CI 3.3. Normal right heart pressures.  ECHO 03/22/20: 1. Global hypokinesis  with akinesis of the inferior and inferolateral  walls; overall severe LV dysfunction.  2. Left ventricular ejection fraction, by estimation, is 25 to 30%. The  left ventricle has severely decreased function. The left ventricle  demonstrates regional wall motion abnormalities (see scoring  diagram/findings for description). The left  ventricular internal cavity size was mildly dilated. Left ventricular  diastolic parameters are consistent with Grade I diastolic dysfunction  (impaired relaxation).  3. Right ventricular systolic function is normal. The right ventricular  size is normal. Tricuspid regurgitation signal is inadequate for assessing  PA pressure.  4. The mitral valve is normal in structure. Mild mitral valve  regurgitation. No evidence of mitral stenosis.  5. The aortic valve is tricuspid. Aortic valve regurgitation is not  visualized. Mild aortic valve sclerosis is present, with no evidence of  aortic valve stenosis.  6. The inferior vena cava is normal in size with greater than 50%  respiratory variability, suggesting right atrial pressure of 3 mmHg. )       Anesthesia Quick Evaluation

## 2020-03-24 NOTE — Progress Notes (Signed)
Discussed IS (1000 mL), sternal precautions, mobility post op and d/c planning with pt. Receptive. Set up preop video for pt. Pt moved to recliner from left side of bed. He rolls to his left better than right due to recent right shoulder surgery. Able to stand with one arm to side close to his body. Gave pt materials to review. His wife will be with him at d/c. 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 He sts he is asx at this time and never lies flat PTA due to his sinuses. [place], ACSM 10:36 AM 03/24/2020

## 2020-03-24 NOTE — Progress Notes (Addendum)
Progress Note  Patient Name: Roy [name] Date of Encounter: 03/24/2020  Primary Cardiologist: Dr. Ena Dawley, MD   Subjective   No chest pain.  Still having cough.  Plans for CABG in the morning.  Inpatient Medications    Scheduled Meds: . aspirin EC  81 mg Oral Daily  . atorvastatin  40 mg Oral QHS  . cholecalciferol  1,000 Units Oral QPM  . insulin aspart  0-15 Units Subcutaneous TID WC  . insulin aspart  0-5 Units Subcutaneous QHS  . metoprolol succinate  25 mg Oral Daily  . pantoprazole  40 mg Oral Daily  . sodium chloride flush  3 mL Intravenous Q12H  . sodium chloride flush  3 mL Intravenous Q12H  . spironolactone  12.5 mg Oral Daily  . tamsulosin  0.4 mg Oral QHS  . vitamin B-12  1,000 mcg Oral Daily   Continuous Infusions: . sodium chloride    . heparin 1,550 Units/hr (03/23/20 1912)   PRN Meds: sodium chloride, acetaminophen, benzonatate, dextromethorphan-guaiFENesin, ondansetron (ZOFRAN) IV, sodium chloride flush   Vital Signs    Vitals:   03/24/20 0200 03/24/20 0314 03/24/20 0542 03/24/20 0748  BP:   117/77 123/89  Pulse: 99 99 95 (!) 103  Resp:    17  Temp:   98.9 F (37.2 C) 97.6 F (36.4 C)  TempSrc:   Oral Oral  SpO2: 94% 92% 93% 91%  Weight:   97.6 kg   Height:        Intake/Output Summary (Last 24 hours) at 03/24/2020 0812 Last data filed at 03/24/2020 0500 Gross per 24 hour  Intake 438.96 ml  Output 150 ml  Net 288.96 ml   Filed Weights   03/21/20 0427 03/23/20 0550 03/24/20 0542  Weight: 96.2 kg 98.7 kg 97.6 kg    Physical Exam   General: Well developed, well nourished, NAD Skin: Warm, dry, intact  Head: Normocephalic, atraumatic, sclera non-icteric, no xanthomas, clear, moist mucus membranes. Neck: Negative for carotid bruits. No JVD Lungs:Clear to ausculation bilaterally. No wheezes, rales, or rhonchi. Breathing is unlabored. Cardiovascular: RRR with S1 S2. No murmurs, rubs, gallops, or LV heave appreciated. Abdomen:  Soft, non-tender, non-distended with normoactive bowel sounds. No hepatomegaly, No rebound/guarding. No obvious abdominal masses. MSK: Strength and tone appear normal for age. 5/5 in all extremities Extremities: No edema. No clubbing or cyanosis. DP/PT pulses 2+ bilaterally Neuro: Alert and oriented. No focal deficits. No facial asymmetry. MAE spontaneously. Psych: Responds to questions appropriately with normal affect.    Labs    Chemistry Recent Labs  Lab 03/21/20 0037 03/22/20 0236 03/22/20 1449 03/23/20 0138 03/23/20 0933 03/23/20 0934 03/24/20 0242  NA 139   < > 138 139 141 140 139  K 4.1   < > 3.7 3.5 3.5 3.5 3.5  CL 108   < > 105 105  --   --  107  CO2 22   < > 23 21*  --   --  20*  GLUCOSE 201*   < > 230* 166*  --   --  144*  BUN 17   < > 18 20  --   --  14  CREATININE 0.95   < > 1.18 1.05  --   --  0.88  CALCIUM 9.4   < > 8.8* 8.7*  --   --  8.6*  PROT 6.4*  --   --   --   --   --   --  ALBUMIN 3.4*  --   --   --   --   --   --   AST 21  --   --   --   --   --   --   ALT 12  --   --   --   --   --   --   ALKPHOS 59  --   --   --   --   --   --   BILITOT 0.8  --   --   --   --   --   --   GFRNONAA >60   < > >60 >60  --   --  >60  ANIONGAP 9   < > 10 13  --   --  12   < > = values in this interval not displayed.     Hematology Recent Labs  Lab 03/22/20 0236 03/23/20 0138 03/23/20 0933 03/23/20 0934 03/24/20 0242  WBC 11.4* 12.2*  --   --  15.1*  RBC 3.46* 3.39*  --   --  3.54*  HGB 10.7* 10.3* 9.9* 9.5* 10.9*  HCT 31.5* 31.4* 29.0* 28.0* 32.1*  MCV 91.0 92.6  --   --  90.7  MCH 30.9 30.4  --   --  30.8  MCHC 34.0 32.8  --   --  34.0  RDW 13.9 13.8  --   --  13.7  PLT 325 298  --   --  304    Cardiac EnzymesNo results for input(s): TROPONINI in the last 168 hours. No results for input(s): TROPIPOC in the last 168 hours.   BNP Recent Labs  Lab 03/21/20 0037  BNP 648.5*     DDimer  Recent Labs  Lab 03/21/20 0037  DDIMER 0.92*     Radiology     CARDIAC CATHETERIZATION  Result Date: 03/23/2020  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  Prox RCA-2 lesion is 50% stenosed.  Dist RCA lesion is 50% stenosed.  Ost Cx lesion is 90% stenosed.  Mid LM lesion is 80% stenosed.  Ost LAD lesion is 75% stenosed.  Mid Cx to Dist Cx lesion is 75% stenosed.  2nd Mrg lesion is 75% stenosed.  2nd Diag lesion is 75% stenosed.  Mid LAD-1 lesion is 75% stenosed.  Mid LAD-2 lesion is 75% stenosed.  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  The left ventricular ejection fraction is 25-35% by visual estimate.  LV end diastolic pressure is normal.  There is no aortic valve stenosis.  Ao sat 94%, PA sat 66%, PA pressure 27/6, mean PA 15 mm Hg; mean PCWP 8 mm Hg, CO 7.5 L/min; CI 3.3. Normal right heart pressures.  Severe, calcific multivessel disease. Cardiac surgery consult.  Results conveyed to his wife by phone, and to Dr. [name].   ECHOCARDIOGRAM COMPLETE  Result Date: 03/22/2020    ECHOCARDIOGRAM REPORT   Patient Name:   Roy [name] Date of Exam: 03/22/2020 Medical Rec #:  [id number]     Height:       73.0 in Accession #:    [id number]    Weight:       212.0 lb Date of Birth:  07/22/1945      BSA:          2.206 m Patient Age:    75 years      BP:           109/72 mmHg Patient Gender: M  HR:           96 bpm. Exam Location:  Inpatient Procedure: 2D Echo, Cardiac Doppler, Color Doppler and Intracardiac            Opacification Agent Indications:    NSTEMI I21.4  History:        Patient has prior history of Echocardiogram examinations, most                 recent 08/03/2015. CHF and Cardiomyopathy,                 Signs/Symptoms:Syncope; Risk Factors:Diabetes, Dyslipidemia and                 Former Smoker.  Sonographer:    Vickie Epley RDCS Referring Phys: [id number] [place]  1. Global hypokinesis with akinesis of the inferior and inferolateral walls; overall severe LV dysfunction.  2. Left ventricular ejection  fraction, by estimation, is 25 to 30%. The left ventricle has severely decreased function. The left ventricle demonstrates regional wall motion abnormalities (see scoring diagram/findings for description). The left ventricular internal cavity size was mildly dilated. Left ventricular diastolic parameters are consistent with Grade I diastolic dysfunction (impaired relaxation).  3. Right ventricular systolic function is normal. The right ventricular size is normal. Tricuspid regurgitation signal is inadequate for assessing PA pressure.  4. The mitral valve is normal in structure. Mild mitral valve regurgitation. No evidence of mitral stenosis.  5. The aortic valve is tricuspid. Aortic valve regurgitation is not visualized. Mild aortic valve sclerosis is present, with no evidence of aortic valve stenosis.  6. The inferior vena cava is normal in size with greater than 50% respiratory variability, suggesting right atrial pressure of 3 mmHg. FINDINGS  Left Ventricle: Left ventricular ejection fraction, by estimation, is 25 to 30%. The left ventricle has severely decreased function. The left ventricle demonstrates regional wall motion abnormalities. Definity contrast agent was given IV to delineate the left ventricular endocardial borders. The left ventricular internal cavity size was mildly dilated. There is no left ventricular hypertrophy. Left ventricular diastolic parameters are consistent with Grade I diastolic dysfunction (impaired relaxation). Right Ventricle: The right ventricular size is normal.Right ventricular systolic function is normal. Tricuspid regurgitation signal is inadequate for assessing PA pressure. The tricuspid regurgitant velocity is 1.72 m/s, and with an assumed right atrial pressure of 3 mmHg, the estimated right ventricular systolic pressure is 22.6 mmHg. Left Atrium: Left atrial size was normal in size. Right Atrium: Right atrial size was normal in size. Pericardium: Trivial pericardial effusion  is present. Mitral Valve: The mitral valve is normal in structure. Mild mitral valve regurgitation. No evidence of mitral valve stenosis. Tricuspid Valve: The tricuspid valve is normal in structure. Tricuspid valve regurgitation is trivial. No evidence of tricuspid stenosis. Aortic Valve: The aortic valve is tricuspid. Aortic valve regurgitation is not visualized. Mild aortic valve sclerosis is present, with no evidence of aortic valve stenosis. Pulmonic Valve: The pulmonic valve was not well visualized. Pulmonic valve regurgitation is not visualized. No evidence of pulmonic stenosis. Aorta: The aortic root is normal in size and structure. Venous: The inferior vena cava is normal in size with greater than 50% respiratory variability, suggesting right atrial pressure of 3 mmHg. IAS/Shunts: The interatrial septum was not well visualized. Additional Comments: Global hypokinesis with akinesis of the inferior and inferolateral walls; overall severe LV dysfunction.  LEFT VENTRICLE PLAX 2D LVIDd:         5.60 cm  Diastology LVIDs:         5.20 cm      LV e' medial:   6.31 cm/s LV PW:         0.80 cm      LV E/e' medial: 9.2 LV IVS:        0.80 cm LVOT diam:     2.80 cm LV SV:         61 LV SV Index:   28 LVOT Area:     6.16 cm  LV Volumes (MOD) LV vol d, MOD A4C: 145.0 ml LV vol s, MOD A4C: 118.0 ml LV SV MOD A4C:     145.0 ml RIGHT VENTRICLE RV S prime:     14.50 cm/s TAPSE (M-mode): 2.0 cm LEFT ATRIUM             Index       RIGHT ATRIUM           Index LA diam:        3.60 cm 1.63 cm/m  RA Area:     14.20 cm LA Vol (A2C):   65.8 ml 29.83 ml/m RA Volume:   36.60 ml  16.59 ml/m LA Vol (A4C):   50.7 ml 22.99 ml/m LA Biplane Vol: 60.9 ml 27.61 ml/m  AORTIC VALVE LVOT Vmax:   55.30 cm/s LVOT Vmean:  39.500 cm/s LVOT VTI:    0.100 m  AORTA Ao Root diam: 3.60 cm Ao Asc diam:  3.50 cm MITRAL VALVE               TRICUSPID VALVE MV Area (PHT): 5.58 cm    TR Peak grad:   11.8 mmHg MV Decel Time: 136 msec    TR Vmax:         172.00 cm/s MV E velocity: 57.80 cm/s MV A velocity: 82.30 cm/s  SHUNTS MV E/A ratio:  0.70        Systemic VTI:  0.10 m                            Systemic Diam: 2.80 cm Kirk Ruths MD Electronically signed by Kirk Ruths MD Signature Date/Time: 03/22/2020/11:12:28 AM    Final    VAS US DOPPLER PRE CABG  Result Date: 03/23/2020 PREOPERATIVE VASCULAR EVALUATION  Indications:      Pre-CABG. Risk Factors:     Hypertension, hyperlipidemia, Diabetes, past history of                   smoking. Comparison Study: Prev Carotid 09/2014 Unable to see results Performing Technologist: Vonzell Schlatter RVT  Examination Guidelines: A complete evaluation includes B-mode imaging, spectral Doppler, color Doppler, and power Doppler as needed of all accessible portions of each vessel. Bilateral testing is considered an integral part of a complete examination. Limited examinations for reoccurring indications may be performed as noted.  Right Carotid Findings: +----------+--------+--------+--------+------------+--------+           PSV cm/sEDV cm/sStenosisDescribe    Comments +----------+--------+--------+--------+------------+--------+ CCA Prox  62      16                                   +----------+--------+--------+--------+------------+--------+ CCA Distal60      15                                   +----------+--------+--------+--------+------------+--------+  ICA Prox  51      19      1-39%   heterogenous         +----------+--------+--------+--------+------------+--------+ ICA Distal63      19                                   +----------+--------+--------+--------+------------+--------+ ECA       66      14                                   +----------+--------+--------+--------+------------+--------+ Portions of this table do not appear on this page. +----------+--------+-------+--------+------------+           PSV cm/sEDV cmsDescribeArm Pressure  +----------+--------+-------+--------+------------+ Subclavian63                                  +----------+--------+-------+--------+------------+ +---------+--------+--+--------+--+ VertebralPSV cm/s60EDV cm/s21 +---------+--------+--+--------+--+ Left Carotid Findings: +----------+--------+--------+--------+--------+--------+           PSV cm/sEDV cm/sStenosisDescribeComments +----------+--------+--------+--------+--------+--------+ CCA Prox  71      14                               +----------+--------+--------+--------+--------+--------+ CCA Distal62      17                               +----------+--------+--------+--------+--------+--------+ ICA Prox  48      21      Normal                   +----------+--------+--------+--------+--------+--------+ ICA Distal62      23                               +----------+--------+--------+--------+--------+--------+ ECA       73      13                               +----------+--------+--------+--------+--------+--------+ +----------+--------+--------+--------+------------+ SubclavianPSV cm/sEDV cm/sDescribeArm Pressure +----------+--------+--------+--------+------------+           64                                   +----------+--------+--------+--------+------------+ +---------+--------+--+--------+--+ VertebralPSV cm/s49EDV cm/s20 +---------+--------+--+--------+--+  ABI Findings: +--------+------------------+-----+---------+--------+ Right   Rt Pressure (mmHg)IndexWaveform Comment  +--------+------------------+-----+---------+--------+ [id number]                    triphasic         +--------+------------------+-----+---------+--------+ PTA     154               1.20 triphasic         +--------+------------------+-----+---------+--------+ DP      155               1.21 triphasic         +--------+------------------+-----+---------+--------+  +--------+------------------+-----+---------+-------+ Left    Lt Pressure (mmHg)IndexWaveform Comment +--------+------------------+-----+---------+-------+ Brachial120                    triphasic        +--------+------------------+-----+---------+-------+  PTA     158               1.23 triphasic        +--------+------------------+-----+---------+-------+ DP      155               1.21 triphasic        +--------+------------------+-----+---------+-------+  Right Doppler Findings: +-----------+--------+-----+---------+--------+ Site       PressureIndexDoppler  Comments +-----------+--------+-----+---------+--------+ Brachial   128          triphasic         +-----------+--------+-----+---------+--------+ Radial                  triphasic         +-----------+--------+-----+---------+--------+ Ulnar                   triphasic         +-----------+--------+-----+---------+--------+ Palmar Arch             triphasic         +-----------+--------+-----+---------+--------+  Left Doppler Findings: +-----------+--------+-----+---------+--------+ Site       PressureIndexDoppler  Comments +-----------+--------+-----+---------+--------+ Brachial   120          triphasic         +-----------+--------+-----+---------+--------+ Radial                  triphasic         +-----------+--------+-----+---------+--------+ Ulnar                   triphasic         +-----------+--------+-----+---------+--------+ Palmar Arch             triphasic         +-----------+--------+-----+---------+--------+  Summary: Right Carotid: Velocities in the right ICA are consistent with a 1-39% stenosis. Left Carotid: There is no evidence of stenosis in the left ICA. Vertebrals:  Bilateral vertebral arteries demonstrate antegrade flow. Subclavians: Normal flow hemodynamics were seen in bilateral subclavian              arteries. Right ABI: Resting right  ankle-brachial index is within normal range. No evidence of significant right lower extremity arterial disease. Left ABI: Resting left ankle-brachial index is within normal range. No evidence of significant left lower extremity arterial disease. Right Upper Extremity: Doppler waveforms decrease 50% with right radial compression. Doppler waveforms decrease >50% with right ulnar compression. Left Upper Extremity: Doppler waveforms remain within normal limits with left radial compression. Doppler waveforms remain within normal limits with left ulnar compression.  Electronically signed by Servando Snare MD on 03/23/2020 at 5:50:56 PM.    Final    Telemetry    Sinus tachycardia in the low 100s- Personally Reviewed  ECG    No new tracing as of 03/24/20- Personally Reviewed  Cardiac Studies   TTE 03/22/2020 1. Global hypokinesis with akinesis of the inferior and inferolateral  walls; overall severe LV dysfunction.  2. Left ventricular ejection fraction, by estimation, is 25 to 30%. The  left ventricle has severely decreased function. The left ventricle  demonstrates regional wall motion abnormalities (see scoring  diagram/findings for description). The left  ventricular internal cavity size was mildly dilated. Left ventricular  diastolic parameters are consistent with Grade I diastolic dysfunction  (impaired relaxation).  3. Right ventricular systolic function is normal. The right ventricular  size is normal. Tricuspid regurgitation signal is inadequate for assessing  PA pressure.  4. The mitral valve is normal in  structure. Mild mitral valve  regurgitation. No evidence of mitral stenosis.  5. The aortic valve is tricuspid. Aortic valve regurgitation is not  visualized. Mild aortic valve sclerosis is present, with no evidence of  aortic valve stenosis.  6. The inferior vena cava is normal in size with greater than 50%  respiratory variability, suggesting right atrial pressure of 3 mmHg.    R/LHC 03/24/19:   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  Prox RCA-2 lesion is 50% stenosed.  Dist RCA lesion is 50% stenosed.  Ost Cx lesion is 90% stenosed.  Mid LM lesion is 80% stenosed.  Ost LAD lesion is 75% stenosed.  Mid Cx to Dist Cx lesion is 75% stenosed.  2nd Mrg lesion is 75% stenosed.  2nd Diag lesion is 75% stenosed.  Mid LAD-1 lesion is 75% stenosed.  Mid LAD-2 lesion is 75% stenosed.  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  The left ventricular ejection fraction is 25-35% by visual estimate.  LV end diastolic pressure is normal.  There is no aortic valve stenosis.  Ao sat 94%, PA sat 66%, PA pressure 27/6, mean PA 15 mm Hg; mean PCWP 8 mm Hg, CO 7.5 L/min; CI 3.3. Normal right heart pressures.   Severe, calcific multivessel disease. Cardiac surgery consult.     Diagnostic Dominance: Right    Intervention   Patient Profile     75 y.o. male with hypertension, diabetes, GERD, nonischemic cardiomyopathy with recovery of EF who was admitted on 03/21/2020 with acute decompensated congestive heart failure and non-STEMI.  Assessment & Plan   1. Acute systolic heart failure: -LVEF 25% with regional wall motion normalities. -Found to have severe three-vessel disease including left main disease. -Plan for CABG tomorrow. -Increase metoprolol succinate to 50 mg daily.  Add losartan 12.5 mg daily.  Continue Aldactone 12.5 mg daily.  He would likely benefit transition to [facility] after CABG. -I would like to give him an extra dose of IV Lasix today.  Still has a cough. -He would benefit from SGLT2 inhibitor at discharge. -He may also need to consider ivabradine.  2.  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 -Continue aspirin 81 mg daily.  On heparin drip. -CABG tomorrow.  3.  Atrial tachycardia: -No further episodes.  Continue metoprolol succinate 50 mg daily.  4.  DM2: -A1c 6.2.  Sliding scale  insulin. -Holding home Metformin.  Would be a good candidate for SGLT2 inhibitor at discharge.  5.  Leukocytosis -Leukocytosis present.  Suspect this relates to non-STEMI and congestive heart failure.  Procalcitonin is negative.  No concerns for pneumonia.  6.  HLD: -Continue Lipitor.  FEN -no IVF -diet: Salt restricted -DVT PPx: Heparin -Code: Full  Time Spent with Patient:  I have spent a total of 25 minutes with patient reviewing hospital notes, telemetry, EKGs, labs and examining the patient as well as establishing an assessment and plan that was discussed with the patient.  > 50% of time was spent in direct patient care.   [place] T. [name], MD, [place] [facility]  [address], [place], [place] [address] [phone]  9:49 AM  For questions or updates, please contact   Please consult [email or web address] for contact info under Cardiology/STEMI.

## 2020-03-24 NOTE — Progress Notes (Signed)
ANTICOAGULATION CONSULT NOTE  Pharmacy Consult for Heparin  Indication: chest pain/ACS  No Known Allergies  Vital Signs: Temp: 98.7 F (37.1 C) (03/01 1145) Temp Source: Oral (03/01 1145) BP: 110/76 (03/01 1145) Pulse Rate: 103 (03/01 1145)  Heparin dosing weight: 96.2 kg  Labs: Recent Labs    03/21/20 1708 03/21/20 1840 03/21/20 1840 03/22/20 0236 03/22/20 0725 03/22/20 1449 03/23/20 0138 03/23/20 0933 03/23/20 0934 03/24/20 0242 03/24/20 0817  HGB  --   --    < > 10.7*  --   --  10.3* 9.9* 9.5* 10.9*  --   HCT  --   --    < > 31.5*  --   --  31.4* 29.0* 28.0* 32.1*  --   PLT  --   --   --  325  --   --  298  --   --  304  --   HEPARINUNFRC  --  0.33   < >  --  0.49  --  0.35  --   --   --  0.30  CREATININE  --   --    < > 1.04  --  1.18 1.05  --   --  0.88  --   TROPONINIHS 1,389* 1,415*  --   --   --   --   --   --   --   --   --    < > = values in this interval not displayed.    Estimated Creatinine Clearance: 87.9 mL/min (by C-G formula based on SCr of 0.88 mg/dL).   Medical History: Past Medical History:  Diagnosis Date  . Allergic rhinitis   . Arthritis    wrists  . Cardiomyopathy, nonischemic [facility])    followed by cardiology--- dr [name]---  2016 ef 45-50% ,  2016 nuclear ef 37%,  2017 per echo ef 50-55%  . CHF (congestive heart failure), NYHA class III ([place]) 03/21/2020  . GERD (gastroesophageal reflux disease)   . Hiatal hernia   . History of kidney stones   . 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 History of syncope (03-06-2020 pt stated has not had sycopal episode in few years, stated it seems to happen in extreme hot conditions)   cardiologist--- dr [name]--- dx recurrent syncope;  nuclear study 11-03-2014 intermediate risk w/ no ischemia, apical hypokinesis, nuclear ef 37%;  event monitor-- 12-21-2015 SB/ ST  no pauses/ arrythmia's;  echo 08-03-2015 ef 50-55%  .  Hyperlipidemia   . Hypertension    followed by pcp  . Hypovitaminosis D   . Mass of left testicle   . Nocturia   . Plantar fasciitis   . Presence of surgical incision    01/ 2022  moh's w/ skin graft of nose, per pt still healing and wear bandage daily  . Type 2 diabetes mellitus ([place])    pt is adament that he is not and have been told he is a diabetic but a borderline;  followed by pcp, in pcp note states DM2 and takes 2 meds daily  . Wears glasses   . Wears hearing aid in both ears     Assessment: 75 y/o M with shortness of breath/epigastric burning. CT Angio negative for PE. Troponin is elevated. Starting heparin. PTA meds reviewed.   Heparin level therapeutic this morning at 0.3. CBC stable overnight. No bleeding per RN.  Cath shows multivessel CAD and CABG is planned for 3/2.   Goal of Therapy:  Heparin level 0.3-0.7 units/ml Monitor platelets by anticoagulation protocol: Yes  Plan:  Continue heparin at 1550 units/hr CABG 3/2  Please check [email or web address] for unit-specific pharmacy phone numbers.

## 2020-03-24 NOTE — Plan of Care (Signed)
  Problem: Education: Goal: Understanding of cardiac disease, CV risk reduction, and recovery process will improve Outcome: Progressing Goal: Understanding of medication regimen will improve Outcome: Progressing Goal: Individualized Educational Video(s) Outcome: Progressing   Problem: Activity: Goal: Ability to tolerate increased activity will improve Outcome: Progressing   Problem: Cardiac: Goal: Ability to achieve and maintain adequate cardiopulmonary perfusion will improve Outcome: Progressing Goal: Vascular access site(s) Level 0-1 will be maintained Outcome: Progressing   Problem: Health Behavior/Discharge Planning: Goal: Ability to safely manage health-related needs after discharge will improve Outcome: Progressing   Problem: Education: Goal: Knowledge of General Education information will improve Description: Including pain rating scale, medication(s)/side effects and non-pharmacologic comfort measures Outcome: Progressing   Problem: Health Behavior/Discharge Planning: Goal: Ability to manage health-related needs will improve Outcome: Progressing   Problem: Clinical Measurements: Goal: Ability to maintain clinical measurements within normal limits will improve Outcome: Progressing Goal: Will remain free from infection Outcome: Progressing Goal: Diagnostic test results will improve Outcome: Progressing Goal: Respiratory complications will improve Outcome: Progressing Goal: Cardiovascular complication will be avoided Outcome: Progressing   Problem: Activity: Goal: Risk for activity intolerance will decrease Outcome: Progressing   Problem: Nutrition: Goal: Adequate nutrition will be maintained Outcome: Progressing   Problem: Coping: Goal: Level of anxiety will decrease Outcome: Progressing   Problem: Elimination: Goal: Will not experience complications related to bowel motility Outcome: Progressing Goal: Will not experience complications related to  urinary retention Outcome: Progressing   Problem: Pain Managment: Goal: General experience of comfort will improve Outcome: Progressing   Problem: Safety: Goal: Ability to remain free from injury will improve Outcome: Progressing   Problem: Skin Integrity: Goal: Risk for impaired skin integrity will decrease Outcome: Progressing   Problem: Education: Goal: Will demonstrate proper wound care and an understanding of methods to prevent future damage Outcome: Progressing Goal: Knowledge of disease or condition will improve Outcome: Progressing Goal: Knowledge of the prescribed therapeutic regimen will improve Outcome: Progressing Goal: Individualized Educational Video(s) Outcome: Progressing   Problem: Activity: Goal: Risk for activity intolerance will decrease Outcome: Progressing   Problem: Cardiac: Goal: Will achieve and/or maintain hemodynamic stability Outcome: Progressing   Problem: Clinical Measurements: Goal: Postoperative complications will be avoided or minimized Outcome: Progressing   Problem: Respiratory: Goal: Respiratory status will improve Outcome: Progressing   Problem: Skin Integrity: Goal: Wound healing without signs and symptoms of infection Outcome: Progressing Goal: Risk for impaired skin integrity will decrease Outcome: Progressing   Problem: Urinary Elimination: Goal: Ability to achieve and maintain adequate renal perfusion and functioning will improve Outcome: Progressing

## 2020-03-24 NOTE — Plan of Care (Signed)
  Problem: Activity: Goal: Ability to tolerate increased activity will improve Outcome: Progressing   Problem: Cardiac: Goal: Ability to achieve and maintain adequate cardiopulmonary perfusion will improve Outcome: Progressing Goal: Vascular access site(s) Level 0-1 will be maintained Outcome: Progressing

## 2020-03-25 ENCOUNTER — Inpatient Hospital Stay (HOSPITAL_COMMUNITY): Payer: Medicare HMO | Admitting: Certified Registered"

## 2020-03-25 ENCOUNTER — Inpatient Hospital Stay (HOSPITAL_COMMUNITY)
Admission: EM | Disposition: A | Discharge status: Home / Self Care | Payer: Self-pay | Source: Home / Self Care | Attending: Cardiothoracic Surgery

## 2020-03-25 ENCOUNTER — Inpatient Hospital Stay (HOSPITAL_COMMUNITY): Payer: Medicare HMO

## 2020-03-25 ENCOUNTER — Ambulatory Visit: Payer: Medicare HMO | Admitting: Hematology

## 2020-03-25 DIAGNOSIS — Z951 Presence of aortocoronary bypass graft: Secondary | ICD-10-CM | POA: Insufficient documentation

## 2020-03-25 HISTORY — PX: RADIAL ARTERY HARVEST: SHX5067

## 2020-03-25 HISTORY — PX: TEE WITHOUT CARDIOVERSION: SHX5443

## 2020-03-25 HISTORY — PX: CORONARY ARTERY BYPASS GRAFT: SHX141

## 2020-03-25 LAB — POCT I-STAT, CHEM 8
BUN: 13 mg/dL (ref 8–23)
BUN: 13 mg/dL (ref 8–23)
BUN: 12 mg/dL (ref 8–23)
BUN: 12 mg/dL (ref 8–23)
BUN: 13 mg/dL (ref 8–23)
Calcium, Ion: 1.23 mmol/L (ref 1.15–1.40)
Calcium, Ion: 1.25 mmol/L (ref 1.15–1.40)
Calcium, Ion: 1.09 mmol/L — ABNORMAL LOW (ref 1.15–1.40)
Calcium, Ion: 1.08 mmol/L — ABNORMAL LOW (ref 1.15–1.40)
Calcium, Ion: 1.26 mmol/L (ref 1.15–1.40)
Chloride: 105 mmol/L (ref 98–111)
Chloride: 104 mmol/L (ref 98–111)
Chloride: 104 mmol/L (ref 98–111)
Chloride: 105 mmol/L (ref 98–111)
Chloride: 106 mmol/L (ref 98–111)
Creatinine, Ser: 0.7 mg/dL (ref 0.61–1.24)
Creatinine, Ser: 0.7 mg/dL (ref 0.61–1.24)
Creatinine, Ser: 0.6 mg/dL — ABNORMAL LOW (ref 0.61–1.24)
Creatinine, Ser: 0.6 mg/dL — ABNORMAL LOW (ref 0.61–1.24)
Creatinine, Ser: 0.6 mg/dL — ABNORMAL LOW (ref 0.61–1.24)
Glucose, Bld: 170 mg/dL — ABNORMAL HIGH (ref 70–99)
Glucose, Bld: 233 mg/dL — ABNORMAL HIGH (ref 70–99)
Glucose, Bld: 227 mg/dL — ABNORMAL HIGH (ref 70–99)
Glucose, Bld: 197 mg/dL — ABNORMAL HIGH (ref 70–99)
Glucose, Bld: 234 mg/dL — ABNORMAL HIGH (ref 70–99)
HCT: 30 % — ABNORMAL LOW (ref 39.0–52.0)
HCT: 27 % — ABNORMAL LOW (ref 39.0–52.0)
HCT: 24 % — ABNORMAL LOW (ref 39.0–52.0)
HCT: 24 % — ABNORMAL LOW (ref 39.0–52.0)
HCT: 27 % — ABNORMAL LOW (ref 39.0–52.0)
Hemoglobin: 10.2 g/dL — ABNORMAL LOW (ref 13.0–17.0)
Hemoglobin: 9.2 g/dL — ABNORMAL LOW (ref 13.0–17.0)
Hemoglobin: 8.2 g/dL — ABNORMAL LOW (ref 13.0–17.0)
Hemoglobin: 8.2 g/dL — ABNORMAL LOW (ref 13.0–17.0)
Hemoglobin: 9.2 g/dL — ABNORMAL LOW (ref 13.0–17.0)
Potassium: 3.6 mmol/L (ref 3.5–5.1)
Potassium: 4.3 mmol/L (ref 3.5–5.1)
Potassium: 4.1 mmol/L (ref 3.5–5.1)
Potassium: 3.9 mmol/L (ref 3.5–5.1)
Potassium: 4.8 mmol/L (ref 3.5–5.1)
Sodium: 142 mmol/L (ref 135–145)
Sodium: 140 mmol/L (ref 135–145)
Sodium: 141 mmol/L (ref 135–145)
Sodium: 139 mmol/L (ref 135–145)
Sodium: 140 mmol/L (ref 135–145)
TCO2: 22 mmol/L (ref 22–32)
TCO2: 22 mmol/L (ref 22–32)
TCO2: 24 mmol/L (ref 22–32)
TCO2: 20 mmol/L — ABNORMAL LOW (ref 22–32)
TCO2: 23 mmol/L (ref 22–32)

## 2020-03-25 LAB — PROTIME-INR
INR: 1.4 — ABNORMAL HIGH (ref 0.8–1.2)
Prothrombin Time: 16.6 seconds — ABNORMAL HIGH (ref 11.4–15.2)

## 2020-03-25 LAB — ECHO INTRAOPERATIVE TEE
AR max vel: 2.86 cm2
AV Area VTI: 3.17 cm2
AV Area mean vel: 2.94 cm2
AV Mean grad: 1 mmHg
AV Peak grad: 2.2 mmHg
Ao pk vel: 0.74 m/s
Area-P 1/2: 4.63 cm2
Height: 73 in
MV M vel: 4.06 m/s
MV Peak grad: 65.9 mmHg
MV VTI: 4.5 cm2
MV Vena cont: 0.4 cm
Radius: 0.6 cm
S' Lateral: 4.98 cm
Weight: 3442.7 oz

## 2020-03-25 LAB — SURGICAL PCR SCREEN
MRSA, PCR: NEGATIVE
Staphylococcus aureus: POSITIVE — AB

## 2020-03-25 LAB — POCT I-STAT 7, (LYTES, BLD GAS, ICA,H+H)
Acid-base deficit: 4 mmol/L — ABNORMAL HIGH (ref 0.0–2.0)
Acid-base deficit: 4 mmol/L — ABNORMAL HIGH (ref 0.0–2.0)
Acid-base deficit: 6 mmol/L — ABNORMAL HIGH (ref 0.0–2.0)
Acid-base deficit: 7 mmol/L — ABNORMAL HIGH (ref 0.0–2.0)
Bicarbonate: 23.4 mmol/L (ref 20.0–28.0)
Bicarbonate: 21.5 mmol/L (ref 20.0–28.0)
Bicarbonate: 19.9 mmol/L — ABNORMAL LOW (ref 20.0–28.0)
Bicarbonate: 17.8 mmol/L — ABNORMAL LOW (ref 20.0–28.0)
Calcium, Ion: 1.07 mmol/L — ABNORMAL LOW (ref 1.15–1.40)
Calcium, Ion: 1.04 mmol/L — ABNORMAL LOW (ref 1.15–1.40)
Calcium, Ion: 1.24 mmol/L (ref 1.15–1.40)
Calcium, Ion: 1.2 mmol/L (ref 1.15–1.40)
HCT: 22 % — ABNORMAL LOW (ref 39.0–52.0)
HCT: 21 % — ABNORMAL LOW (ref 39.0–52.0)
HCT: 23 % — ABNORMAL LOW (ref 39.0–52.0)
HCT: 28 % — ABNORMAL LOW (ref 39.0–52.0)
Hemoglobin: 7.5 g/dL — ABNORMAL LOW (ref 13.0–17.0)
Hemoglobin: 7.1 g/dL — ABNORMAL LOW (ref 13.0–17.0)
Hemoglobin: 7.8 g/dL — ABNORMAL LOW (ref 13.0–17.0)
Hemoglobin: 9.5 g/dL — ABNORMAL LOW (ref 13.0–17.0)
O2 Saturation: 100 %
O2 Saturation: 100 %
O2 Saturation: 95 %
O2 Saturation: 97 %
Patient temperature: 36.4
Potassium: 4 mmol/L (ref 3.5–5.1)
Potassium: 4.4 mmol/L (ref 3.5–5.1)
Potassium: 3.9 mmol/L (ref 3.5–5.1)
Potassium: 4 mmol/L (ref 3.5–5.1)
Sodium: 140 mmol/L (ref 135–145)
Sodium: 132 mmol/L — ABNORMAL LOW (ref 135–145)
Sodium: 140 mmol/L (ref 135–145)
Sodium: 142 mmol/L (ref 135–145)
TCO2: 25 mmol/L (ref 22–32)
TCO2: 23 mmol/L (ref 22–32)
TCO2: 21 mmol/L — ABNORMAL LOW (ref 22–32)
TCO2: 19 mmol/L — ABNORMAL LOW (ref 22–32)
pCO2 arterial: 55.5 mmHg — ABNORMAL HIGH (ref 32.0–48.0)
pCO2 arterial: 38 mmHg (ref 32.0–48.0)
pCO2 arterial: 41.3 mmHg (ref 32.0–48.0)
pCO2 arterial: 32.5 mmHg (ref 32.0–48.0)
pH, Arterial: 7.233 — ABNORMAL LOW (ref 7.350–7.450)
pH, Arterial: 7.36 (ref 7.350–7.450)
pH, Arterial: 7.29 — ABNORMAL LOW (ref 7.350–7.450)
pH, Arterial: 7.343 — ABNORMAL LOW (ref 7.350–7.450)
pO2, Arterial: 312 mmHg — ABNORMAL HIGH (ref 83.0–108.0)
pO2, Arterial: 346 mmHg — ABNORMAL HIGH (ref 83.0–108.0)
pO2, Arterial: 85 mmHg (ref 83.0–108.0)
pO2, Arterial: 94 mmHg (ref 83.0–108.0)

## 2020-03-25 LAB — CBC
HCT: 34.9 % — ABNORMAL LOW (ref 39.0–52.0)
HCT: 29.3 % — ABNORMAL LOW (ref 39.0–52.0)
HCT: 29.1 % — ABNORMAL LOW (ref 39.0–52.0)
Hemoglobin: 11.3 g/dL — ABNORMAL LOW (ref 13.0–17.0)
Hemoglobin: 10 g/dL — ABNORMAL LOW (ref 13.0–17.0)
Hemoglobin: 9.6 g/dL — ABNORMAL LOW (ref 13.0–17.0)
MCH: 30.2 pg (ref 26.0–34.0)
MCH: 31.6 pg (ref 26.0–34.0)
MCH: 30.7 pg (ref 26.0–34.0)
MCHC: 32.4 g/dL (ref 30.0–36.0)
MCHC: 34.1 g/dL (ref 30.0–36.0)
MCHC: 33 g/dL (ref 30.0–36.0)
MCV: 93.3 fL (ref 80.0–100.0)
MCV: 92.7 fL (ref 80.0–100.0)
MCV: 93 fL (ref 80.0–100.0)
Platelets: 338 10*3/uL (ref 150–400)
Platelets: 215 10*3/uL (ref 150–400)
Platelets: 204 10*3/uL (ref 150–400)
RBC: 3.74 MIL/uL — ABNORMAL LOW (ref 4.22–5.81)
RBC: 3.16 MIL/uL — ABNORMAL LOW (ref 4.22–5.81)
RBC: 3.13 MIL/uL — ABNORMAL LOW (ref 4.22–5.81)
RDW: 13.6 % (ref 11.5–15.5)
RDW: 13.6 % (ref 11.5–15.5)
RDW: 13.8 % (ref 11.5–15.5)
WBC: 12.3 10*3/uL — ABNORMAL HIGH (ref 4.0–10.5)
WBC: 28 10*3/uL — ABNORMAL HIGH (ref 4.0–10.5)
WBC: 23.5 10*3/uL — ABNORMAL HIGH (ref 4.0–10.5)
nRBC: 0 % (ref 0.0–0.2)
nRBC: 0 % (ref 0.0–0.2)
nRBC: 0 % (ref 0.0–0.2)

## 2020-03-25 LAB — POCT I-STAT EG7
Acid-base deficit: 3 mmol/L — ABNORMAL HIGH (ref 0.0–2.0)
Bicarbonate: 24 mmol/L (ref 20.0–28.0)
Calcium, Ion: 1.08 mmol/L — ABNORMAL LOW (ref 1.15–1.40)
HCT: 23 % — ABNORMAL LOW (ref 39.0–52.0)
Hemoglobin: 7.8 g/dL — ABNORMAL LOW (ref 13.0–17.0)
O2 Saturation: 75 %
Potassium: 4.4 mmol/L (ref 3.5–5.1)
Sodium: 141 mmol/L (ref 135–145)
TCO2: 26 mmol/L (ref 22–32)
pCO2, Ven: 52.6 mmHg (ref 44.0–60.0)
pH, Ven: 7.268 (ref 7.250–7.430)
pO2, Ven: 46 mmHg — ABNORMAL HIGH (ref 32.0–45.0)

## 2020-03-25 LAB — URINALYSIS, ROUTINE W REFLEX MICROSCOPIC
Bilirubin Urine: NEGATIVE
Glucose, UA: NEGATIVE mg/dL
Ketones, ur: 5 mg/dL — AB
Leukocytes,Ua: NEGATIVE
Nitrite: NEGATIVE
Protein, ur: NEGATIVE mg/dL
Specific Gravity, Urine: 1.027 (ref 1.005–1.030)
pH: 5 (ref 5.0–8.0)

## 2020-03-25 LAB — BASIC METABOLIC PANEL
Anion gap: 12 (ref 5–15)
Anion gap: 10 (ref 5–15)
BUN: 12 mg/dL (ref 8–23)
BUN: 13 mg/dL (ref 8–23)
CO2: 20 mmol/L — ABNORMAL LOW (ref 22–32)
CO2: 16 mmol/L — ABNORMAL LOW (ref 22–32)
Calcium: 8.7 mg/dL — ABNORMAL LOW (ref 8.9–10.3)
Calcium: 7.4 mg/dL — ABNORMAL LOW (ref 8.9–10.3)
Chloride: 106 mmol/L (ref 98–111)
Chloride: 109 mmol/L (ref 98–111)
Creatinine, Ser: 1.04 mg/dL (ref 0.61–1.24)
Creatinine, Ser: 0.97 mg/dL (ref 0.61–1.24)
GFR, Estimated: >60 mL/min (ref >60)
GFR, Estimated: >60 mL/min (ref >60)
Glucose, Bld: 171 mg/dL — ABNORMAL HIGH (ref 70–99)
Glucose, Bld: 161 mg/dL — ABNORMAL HIGH (ref 70–99)
Potassium: 3.5 mmol/L (ref 3.5–5.1)
Potassium: 3.9 mmol/L (ref 3.5–5.1)
Sodium: 138 mmol/L (ref 135–145)
Sodium: 135 mmol/L (ref 135–145)

## 2020-03-25 LAB — APTT: aPTT: 32 seconds (ref 24–36)

## 2020-03-25 LAB — GLUCOSE, CAPILLARY
Glucose-Capillary: 154 mg/dL — ABNORMAL HIGH (ref 70–99)
Glucose-Capillary: 138 mg/dL — ABNORMAL HIGH (ref 70–99)

## 2020-03-25 LAB — HEMOGLOBIN AND HEMATOCRIT, BLOOD
HCT: 21 % — ABNORMAL LOW (ref 39.0–52.0)
Hemoglobin: 6.8 g/dL — CL (ref 13.0–17.0)

## 2020-03-25 LAB — MAGNESIUM: Magnesium: 2.7 mg/dL — ABNORMAL HIGH (ref 1.7–2.4)

## 2020-03-25 LAB — PREPARE RBC (CROSSMATCH)

## 2020-03-25 LAB — PLATELET COUNT: Platelets: 193 10*3/uL (ref 150–400)

## 2020-03-25 LAB — HEPARIN LEVEL (UNFRACTIONATED): Heparin Unfractionated: 0.31 IU/mL (ref 0.30–0.70)

## 2020-03-25 IMAGING — DX DG CHEST 1V PORT
1 series · 1 of 1 positions shown · non-contrast
Comparison: Chest x-rays dated [DATE] and [DATE].

CLINICAL DATA: Status post CABG.

EXAM:
PORTABLE CHEST 1 VIEW

[chest ap]
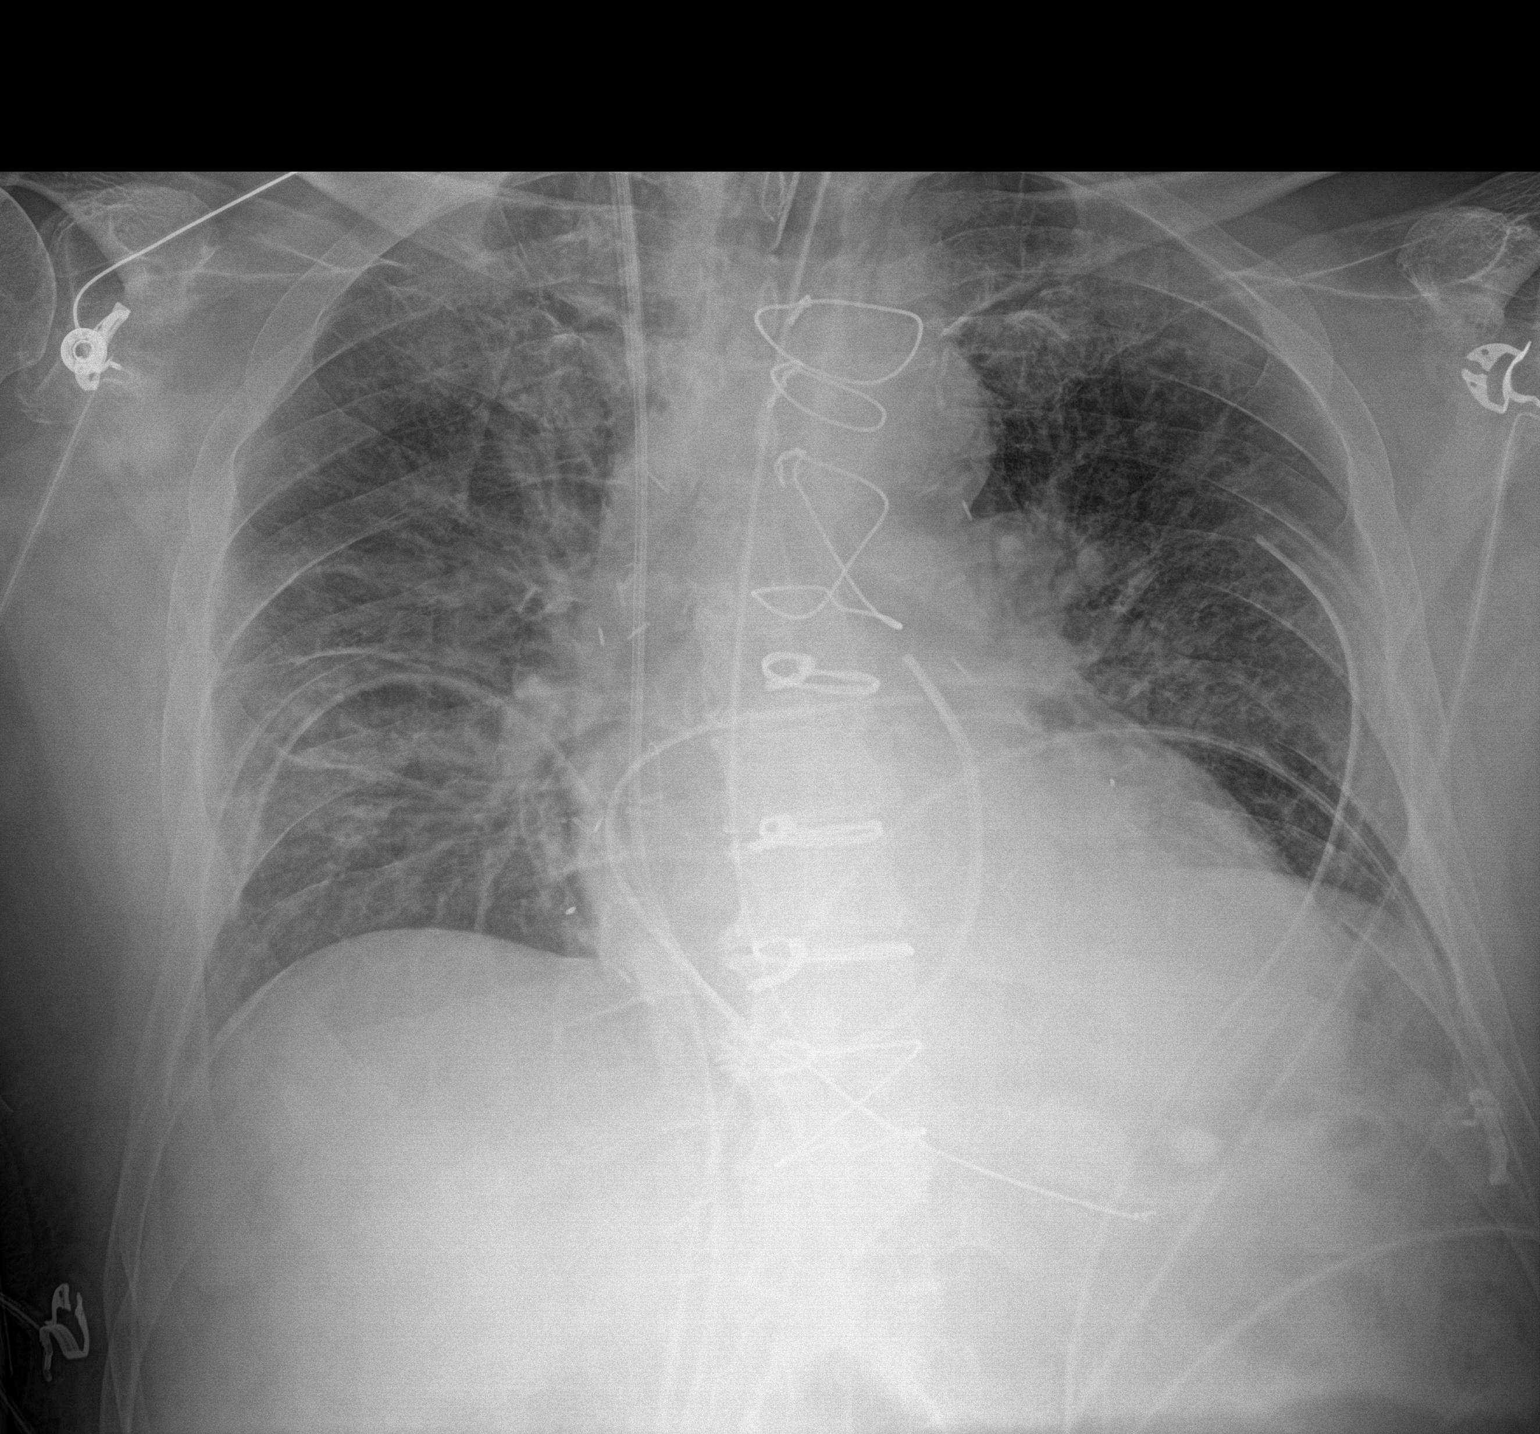

[1 of 1 positions shown; findings below may reference images not displayed]

FINDINGS: Heart size and mediastinal contours are stable. Endotracheal tube
appears adequately positioned with tip at the level the clavicles.
Swan-Ganz catheter in place with tip just to the LEFT of midline.
Median sternotomy wires now in place.

Central pulmonary vascular congestion. No pleural effusion or
pneumothorax is seen.
IMPRESSION: 1. Central pulmonary vascular congestion suggesting mild volume
overload/CHF.
2. Swan-Ganz catheter in place with tip just to the LEFT of midline.
Consider advancing for optimal radiographic positioning.

## 2020-03-25 SURGERY — CORONARY ARTERY BYPASS GRAFTING (CABG)
Anesthesia: General | Site: Chest

## 2020-03-25 MED ORDER — BACITRACIN ZINC 500 UNIT/GM EX OINT
TOPICAL_OINTMENT | CUTANEOUS | Status: AC
  Filled 2020-03-25: qty 28.35

## 2020-03-25 MED ORDER — ASPIRIN EC 325 MG PO TBEC
325.0000 mg | DELAYED_RELEASE_TABLET | Freq: Every day | ORAL | Status: DC
  Administered 2020-03-26 – 2020-04-02 (×8): 325 mg via ORAL
  Filled 2020-03-25 (×8): qty 1

## 2020-03-25 MED ORDER — NOREPINEPHRINE 4 MG/250ML-% IV SOLN
0.0000 ug/min | INTRAVENOUS | Status: DC
  Filled 2020-03-25: qty 250

## 2020-03-25 MED ORDER — THROMBIN 5000 UNITS EX SOLR
INTRAVENOUS | Status: DC | PRN
  Administered 2020-03-25 (×2): 2 mL

## 2020-03-25 MED ORDER — VASOPRESSIN 20 UNIT/ML IV SOLN
INTRAVENOUS | Status: AC
  Filled 2020-03-25: qty 1

## 2020-03-25 MED ORDER — DEXTROSE 5 % IV SOLN
150.0000 mg | INTRAVENOUS | Status: DC
  Filled 2020-03-25: qty 3

## 2020-03-25 MED ORDER — POTASSIUM CHLORIDE 10 MEQ/50ML IV SOLN: 10.0000 meq | INTRAVENOUS | Status: DC

## 2020-03-25 MED ORDER — LACTATED RINGERS IV SOLN: 500.0000 mL | Freq: Once | INTRAVENOUS | Status: DC | PRN

## 2020-03-25 MED ORDER — MAGNESIUM SULFATE 4 GM/100ML IV SOLN
4.0000 g | Freq: Once | INTRAVENOUS | Status: AC
  Administered 2020-03-25: 4 g via INTRAVENOUS
  Filled 2020-03-25: qty 100

## 2020-03-25 MED ORDER — DEXMEDETOMIDINE HCL IN NACL 400 MCG/100ML IV SOLN
0.0000 ug/kg/h | INTRAVENOUS | Status: DC
  Administered 2020-03-25: 0.5 ug/kg/h via INTRAVENOUS
  Filled 2020-03-25: qty 100

## 2020-03-25 MED ORDER — DEXTROSE 50 % IV SOLN
0.0000 mL | INTRAVENOUS | Status: DC | PRN
Start: 2020-03-25 — End: 2020-03-28

## 2020-03-25 MED ORDER — CHLORHEXIDINE GLUCONATE CLOTH 2 % EX PADS
6.0000 | MEDICATED_PAD | Freq: Every day | CUTANEOUS | Status: DC
  Administered 2020-03-25: 6 via TOPICAL

## 2020-03-25 MED ORDER — PROTAMINE SULFATE 10 MG/ML IV SOLN
INTRAVENOUS | Status: DC | PRN
  Administered 2020-03-25: 250 mg via INTRAVENOUS

## 2020-03-25 MED ORDER — ACETAMINOPHEN 160 MG/5ML PO SOLN: 650.0000 mg | Freq: Once | ORAL | Status: AC

## 2020-03-25 MED ORDER — ACETAMINOPHEN 500 MG PO TABS
1000.0000 mg | ORAL_TABLET | Freq: Four times a day (QID) | ORAL | Status: AC
  Administered 2020-03-25 – 2020-03-30 (×18): 1000 mg via ORAL
  Filled 2020-03-25 (×18): qty 2

## 2020-03-25 MED ORDER — DOCUSATE SODIUM 100 MG PO CAPS
200.0000 mg | ORAL_CAPSULE | Freq: Every day | ORAL | Status: DC
  Administered 2020-03-26 – 2020-03-30 (×5): 200 mg via ORAL
  Filled 2020-03-25 (×5): qty 2

## 2020-03-25 MED ORDER — PHENYLEPHRINE HCL-NACL 20-0.9 MG/250ML-% IV SOLN
0.0000 ug/min | INTRAVENOUS | Status: DC
  Administered 2020-03-25: 70 ug/min via INTRAVENOUS
  Administered 2020-03-25: 30 ug/min via INTRAVENOUS
  Filled 2020-03-25: qty 250

## 2020-03-25 MED ORDER — SODIUM CHLORIDE 0.9 % IV SOLN: INTRAVENOUS | Status: DC

## 2020-03-25 MED ORDER — PROPOFOL 10 MG/ML IV BOLUS
INTRAVENOUS | Status: AC
  Filled 2020-03-25: qty 20

## 2020-03-25 MED ORDER — BISACODYL 10 MG RE SUPP: 10.0000 mg | Freq: Every day | RECTAL | Status: DC

## 2020-03-25 MED ORDER — LACTATED RINGERS IV SOLN: INTRAVENOUS | Status: DC | PRN

## 2020-03-25 MED ORDER — BUPIVACAINE LIPOSOME 1.3 % IJ SUSP
20.0000 mL | Freq: Once | INTRAMUSCULAR | Status: DC
  Administered 2020-03-25: 266 mg
  Filled 2020-03-25: qty 20

## 2020-03-25 MED ORDER — VASOPRESSIN 20 UNITS/100 ML INFUSION FOR SHOCK
0.0000 [IU]/min | INTRAVENOUS | Status: DC
  Administered 2020-03-25 (×2): 0.03 [IU]/min via INTRAVENOUS
  Filled 2020-03-25: qty 100

## 2020-03-25 MED ORDER — FENTANYL CITRATE (PF) 250 MCG/5ML IJ SOLN
INTRAMUSCULAR | Status: DC | PRN
  Administered 2020-03-25: 100 ug via INTRAVENOUS
  Administered 2020-03-25: 50 ug via INTRAVENOUS
  Administered 2020-03-25: 100 ug via INTRAVENOUS
  Administered 2020-03-25: 150 ug via INTRAVENOUS
  Administered 2020-03-25: 50 ug via INTRAVENOUS
  Administered 2020-03-25: 150 ug via INTRAVENOUS

## 2020-03-25 MED ORDER — SODIUM CHLORIDE 0.9% FLUSH: 3.0000 mL | INTRAVENOUS | Status: DC | PRN

## 2020-03-25 MED ORDER — CHLORHEXIDINE GLUCONATE CLOTH 2 % EX PADS
6.0000 | MEDICATED_PAD | Freq: Every day | CUTANEOUS | Status: AC
  Administered 2020-03-27 – 2020-03-29 (×3): 6 via TOPICAL

## 2020-03-25 MED ORDER — METOPROLOL TARTRATE 5 MG/5ML IV SOLN
2.5000 mg | INTRAVENOUS | Status: DC | PRN
  Administered 2020-03-28: 5 mg via INTRAVENOUS
  Filled 2020-03-25 (×2): qty 5

## 2020-03-25 MED ORDER — CHLORHEXIDINE GLUCONATE 0.12 % MT SOLN
15.0000 mL | OROMUCOSAL | Status: AC
  Administered 2020-03-25: 15 mL via OROMUCOSAL

## 2020-03-25 MED ORDER — STERILE WATER FOR INJECTION IV SOLN
INTRAVENOUS | Status: DC | PRN
  Administered 2020-03-25: 30 mL

## 2020-03-25 MED ORDER — HEPARIN SODIUM (PORCINE) 1000 UNIT/ML IJ SOLN
INTRAMUSCULAR | Status: DC | PRN
  Administered 2020-03-25: 30000 [IU] via INTRAVENOUS
  Administered 2020-03-25: 8000 [IU] via INTRAVENOUS

## 2020-03-25 MED ORDER — HEMOSTATIC AGENTS (NO CHARGE) OPTIME
TOPICAL | Status: DC | PRN
  Administered 2020-03-25: 1 via TOPICAL

## 2020-03-25 MED ORDER — SODIUM CHLORIDE 0.9% FLUSH
10.0000 mL | Freq: Two times a day (BID) | INTRAVENOUS | Status: DC
  Administered 2020-03-25 – 2020-03-26 (×3): 10 mL

## 2020-03-25 MED ORDER — VANCOMYCIN HCL 1000 MG IV SOLR
INTRAVENOUS | Status: AC
  Filled 2020-03-25: qty 3000

## 2020-03-25 MED ORDER — FAMOTIDINE IN NACL 20-0.9 MG/50ML-% IV SOLN
20.0000 mg | Freq: Two times a day (BID) | INTRAVENOUS | Status: AC
  Administered 2020-03-25 (×2): 20 mg via INTRAVENOUS
  Filled 2020-03-25 (×2): qty 50

## 2020-03-25 MED ORDER — MORPHINE SULFATE (PF) 2 MG/ML IV SOLN
1.0000 mg | INTRAVENOUS | Status: DC | PRN
  Administered 2020-03-25 – 2020-03-26 (×3): 2 mg via INTRAVENOUS
  Filled 2020-03-25 (×3): qty 1

## 2020-03-25 MED ORDER — ONDANSETRON HCL 4 MG/2ML IJ SOLN: 4.0000 mg | Freq: Four times a day (QID) | INTRAMUSCULAR | Status: DC | PRN

## 2020-03-25 MED ORDER — BUPIVACAINE HCL (PF) 0.5 % IJ SOLN
INTRAMUSCULAR | Status: AC
  Filled 2020-03-25: qty 30

## 2020-03-25 MED ORDER — STERILE WATER FOR INJECTION IJ SOLN
INTRAMUSCULAR | Status: AC
  Filled 2020-03-25: qty 10

## 2020-03-25 MED ORDER — ORAL CARE MOUTH RINSE
15.0000 mL | Freq: Two times a day (BID) | OROMUCOSAL | Status: DC
  Administered 2020-03-25 – 2020-03-29 (×7): 15 mL via OROMUCOSAL

## 2020-03-25 MED ORDER — ALBUMIN HUMAN 5 % IV SOLN
250.0000 mL | INTRAVENOUS | Status: AC | PRN
  Administered 2020-03-25 (×3): 12.5 g via INTRAVENOUS
  Filled 2020-03-25: qty 250

## 2020-03-25 MED ORDER — NITROGLYCERIN IN D5W 200-5 MCG/ML-% IV SOLN: 7.0000 ug/min | INTRAVENOUS | Status: DC

## 2020-03-25 MED ORDER — SODIUM CHLORIDE 0.9 % IV SOLN: 250.0000 mL | INTRAVENOUS | Status: DC

## 2020-03-25 MED ORDER — SODIUM CHLORIDE 0.9 % IV SOLN
1.5000 g | Freq: Two times a day (BID) | INTRAVENOUS | Status: AC
  Administered 2020-03-25 – 2020-03-27 (×4): 1.5 g via INTRAVENOUS
  Filled 2020-03-25 (×4): qty 1.5

## 2020-03-25 MED ORDER — NITROGLYCERIN IN D5W 200-5 MCG/ML-% IV SOLN
0.0000 ug/min | INTRAVENOUS | Status: DC
  Administered 2020-03-25: 5 ug/min via INTRAVENOUS

## 2020-03-25 MED ORDER — ALBUMIN HUMAN 5 % IV SOLN: INTRAVENOUS | Status: DC | PRN

## 2020-03-25 MED ORDER — PHENYLEPHRINE 40 MCG/ML (10ML) SYRINGE FOR IV PUSH (FOR BLOOD PRESSURE SUPPORT)
PREFILLED_SYRINGE | INTRAVENOUS | Status: DC | PRN
  Administered 2020-03-25: 80 ug via INTRAVENOUS
  Administered 2020-03-25: 120 ug via INTRAVENOUS
  Administered 2020-03-25: 80 ug via INTRAVENOUS

## 2020-03-25 MED ORDER — VANCOMYCIN HCL IN DEXTROSE 1-5 GM/200ML-% IV SOLN
1000.0000 mg | Freq: Once | INTRAVENOUS | Status: AC
  Administered 2020-03-25: 1000 mg via INTRAVENOUS
  Filled 2020-03-25: qty 200

## 2020-03-25 MED ORDER — BISACODYL 5 MG PO TBEC
10.0000 mg | DELAYED_RELEASE_TABLET | Freq: Every day | ORAL | Status: DC
  Administered 2020-03-26 – 2020-03-30 (×5): 10 mg via ORAL
  Filled 2020-03-25 (×5): qty 2

## 2020-03-25 MED ORDER — TRAMADOL HCL 50 MG PO TABS
50.0000 mg | ORAL_TABLET | ORAL | Status: DC | PRN
  Administered 2020-03-26: 12:00:00 50 mg via ORAL
  Administered 2020-03-27 – 2020-03-29 (×2): 100 mg via ORAL
  Filled 2020-03-25: qty 2
  Filled 2020-03-25: qty 1
  Filled 2020-03-25: qty 2

## 2020-03-25 MED ORDER — PLASMA-LYTE A IV SOLN: INTRAVENOUS | Status: DC

## 2020-03-25 MED ORDER — PROPOFOL 10 MG/ML IV BOLUS
INTRAVENOUS | Status: DC | PRN
  Administered 2020-03-25: 90 mg via INTRAVENOUS

## 2020-03-25 MED ORDER — INSULIN REGULAR(HUMAN) IN NACL 100-0.9 UT/100ML-% IV SOLN
INTRAVENOUS | Status: DC
  Administered 2020-03-25: 4 [IU]/h via INTRAVENOUS
  Administered 2020-03-27: 2.8 [IU]/h via INTRAVENOUS
  Filled 2020-03-25 (×2): qty 100

## 2020-03-25 MED ORDER — LIDOCAINE 2% (20 MG/ML) 5 ML SYRINGE: INTRAMUSCULAR | Status: DC | PRN

## 2020-03-25 MED ORDER — ORAL CARE MOUTH RINSE: 15.0000 mL | OROMUCOSAL | Status: DC

## 2020-03-25 MED ORDER — SODIUM BICARBONATE 8.4 % IV SOLN
100.0000 meq | Freq: Once | INTRAVENOUS | Status: AC
  Administered 2020-03-25: 100 meq via INTRAVENOUS

## 2020-03-25 MED ORDER — VASOPRESSIN 20 UNITS/100 ML INFUSION FOR SHOCK
0.0000 [IU]/min | INTRAVENOUS | Status: DC
  Filled 2020-03-25: qty 100

## 2020-03-25 MED ORDER — PLASMA-LYTE 148 IV SOLN
INTRAVENOUS | Status: DC | PRN
  Administered 2020-03-25: 500 mL via INTRAVASCULAR

## 2020-03-25 MED ORDER — MIDAZOLAM HCL 5 MG/5ML IJ SOLN
INTRAMUSCULAR | Status: DC | PRN
  Administered 2020-03-25: 3 mg via INTRAVENOUS
  Administered 2020-03-25: 1 mg via INTRAVENOUS
  Administered 2020-03-25 (×3): 2 mg via INTRAVENOUS

## 2020-03-25 MED ORDER — SODIUM CHLORIDE 0.45 % IV SOLN: INTRAVENOUS | Status: DC | PRN

## 2020-03-25 MED ORDER — OXYCODONE HCL 5 MG PO TABS
5.0000 mg | ORAL_TABLET | ORAL | Status: DC | PRN
  Administered 2020-03-25 – 2020-03-26 (×2): 10 mg via ORAL
  Filled 2020-03-25 (×2): qty 2

## 2020-03-25 MED ORDER — MUPIROCIN 2 % EX OINT: 1.0000 "application " | TOPICAL_OINTMENT | Freq: Two times a day (BID) | CUTANEOUS | Status: DC

## 2020-03-25 MED ORDER — LACTATED RINGERS IV SOLN: INTRAVENOUS | Status: DC

## 2020-03-25 MED ORDER — ASPIRIN 81 MG PO CHEW
324.0000 mg | CHEWABLE_TABLET | Freq: Every day | ORAL | Status: DC
  Filled 2020-03-25: qty 4

## 2020-03-25 MED ORDER — MIDAZOLAM HCL (PF) 10 MG/2ML IJ SOLN
INTRAMUSCULAR | Status: AC
  Filled 2020-03-25: qty 2

## 2020-03-25 MED ORDER — MIDAZOLAM HCL 2 MG/2ML IJ SOLN: 2.0000 mg | INTRAMUSCULAR | Status: DC | PRN

## 2020-03-25 MED ORDER — SODIUM CHLORIDE 0.9% IV SOLUTION: Freq: Once | INTRAVENOUS | Status: DC

## 2020-03-25 MED ORDER — ROCURONIUM BROMIDE 10 MG/ML (PF) SYRINGE
PREFILLED_SYRINGE | INTRAVENOUS | Status: DC | PRN
  Administered 2020-03-25: 40 mg via INTRAVENOUS
  Administered 2020-03-25: 50 mg via INTRAVENOUS
  Administered 2020-03-25: 60 mg via INTRAVENOUS

## 2020-03-25 MED ORDER — SODIUM CHLORIDE 0.9% FLUSH: 10.0000 mL | INTRAVENOUS | Status: DC | PRN

## 2020-03-25 MED ORDER — BUPIVACAINE LIPOSOME 1.3 % IJ SUSP
INTRAMUSCULAR | Status: DC | PRN
  Administered 2020-03-25: 50 mL

## 2020-03-25 MED ORDER — FENTANYL CITRATE (PF) 250 MCG/5ML IJ SOLN
INTRAMUSCULAR | Status: AC
  Filled 2020-03-25: qty 20

## 2020-03-25 MED ORDER — EPINEPHRINE HCL 5 MG/250ML IV SOLN IN NS
0.0000 ug/min | INTRAVENOUS | Status: DC
  Administered 2020-03-26: 5 ug/min via INTRAVENOUS
  Filled 2020-03-25 (×2): qty 250

## 2020-03-25 MED ORDER — SODIUM BICARBONATE 8.4 % IV SOLN
50.0000 meq | INTRAVENOUS | Status: AC
Start: 2020-03-25 — End: 2020-03-25

## 2020-03-25 MED ORDER — NOREPINEPHRINE 4 MG/250ML-% IV SOLN
0.0000 ug/min | INTRAVENOUS | Status: DC
  Administered 2020-03-25: 9 ug/min via INTRAVENOUS
  Administered 2020-03-25: 10 ug/min via INTRAVENOUS
  Administered 2020-03-26: 11 ug/min via INTRAVENOUS
  Administered 2020-03-26: 10 ug/min via INTRAVENOUS
  Administered 2020-03-26: 8 ug/min via INTRAVENOUS
  Administered 2020-03-26: 13 ug/min via INTRAVENOUS
  Administered 2020-03-27: 8 ug/min via INTRAVENOUS
  Administered 2020-03-27: 4 ug/min via INTRAVENOUS
  Filled 2020-03-25 (×7): qty 250

## 2020-03-25 MED ORDER — METOPROLOL TARTRATE 12.5 MG HALF TABLET: 12.5000 mg | ORAL_TABLET | Freq: Two times a day (BID) | ORAL | Status: DC

## 2020-03-25 MED ORDER — CHLORHEXIDINE GLUCONATE 0.12% ORAL RINSE (MEDLINE KIT): 15.0000 mL | Freq: Two times a day (BID) | OROMUCOSAL | Status: DC

## 2020-03-25 MED ORDER — METOPROLOL TARTRATE 25 MG/10 ML ORAL SUSPENSION: 12.5000 mg | Freq: Two times a day (BID) | ORAL | Status: DC

## 2020-03-25 MED ORDER — PANTOPRAZOLE SODIUM 40 MG PO TBEC
40.0000 mg | DELAYED_RELEASE_TABLET | Freq: Every day | ORAL | Status: DC
  Administered 2020-03-27 – 2020-04-02 (×7): 40 mg via ORAL
  Filled 2020-03-25 (×7): qty 1

## 2020-03-25 MED ORDER — LIDOCAINE 2% (20 MG/ML) 5 ML SYRINGE
INTRAMUSCULAR | Status: DC | PRN
  Administered 2020-03-25: 100 mg via INTRAVENOUS

## 2020-03-25 MED ORDER — ACETAMINOPHEN 160 MG/5ML PO SOLN: 1000.0000 mg | Freq: Four times a day (QID) | ORAL | Status: AC

## 2020-03-25 MED ORDER — ACETAMINOPHEN 650 MG RE SUPP
650.0000 mg | Freq: Once | RECTAL | Status: AC
  Administered 2020-03-25: 650 mg via RECTAL

## 2020-03-25 MED ORDER — VASOPRESSIN 20 UNITS/100 ML INFUSION FOR SHOCK
INTRAVENOUS | Status: DC | PRN
  Administered 2020-03-25: .03 [IU]/min via INTRAVENOUS

## 2020-03-25 MED ORDER — VANCOMYCIN HCL 1000 MG IV SOLR
INTRAVENOUS | Status: DC | PRN
  Administered 2020-03-25 (×3): 1000 mg

## 2020-03-25 MED ORDER — 0.9 % SODIUM CHLORIDE (POUR BTL) OPTIME
TOPICAL | Status: DC | PRN
  Administered 2020-03-25: 5000 mL

## 2020-03-25 MED ORDER — SODIUM CHLORIDE 0.9% FLUSH
3.0000 mL | Freq: Two times a day (BID) | INTRAVENOUS | Status: DC
  Administered 2020-03-26: 3 mL via INTRAVENOUS

## 2020-03-25 MED FILL — Electrolyte-148 Solution: INTRAVENOUS | Qty: 500 | Status: AC

## 2020-03-25 MED FILL — Heparin Sodium (Porcine) Inj 1000 Unit/ML: INTRAMUSCULAR | Qty: 2.5 | Status: AC

## 2020-03-25 MED FILL — Papaverine HCl Inj 30 MG/ML: INTRAMUSCULAR | Qty: 1 | Status: AC

## 2020-03-25 MED FILL — Bupivacaine Liposome Inj 1.3% (13.3 MG/ML): INTRAMUSCULAR | Qty: 20 | Status: AC

## 2020-03-25 MED FILL — Thrombin For Soln 5000 Unit: CUTANEOUS | Qty: 5000 | Status: AC

## 2020-03-25 MED FILL — Calcium Chloride Inj 10%: INTRAVENOUS | Qty: 5 | Status: AC

## 2020-03-25 MED FILL — Bupivacaine HCl Preservative Free (PF) Inj 0.5%: INTRAMUSCULAR | Qty: 30 | Status: AC

## 2020-03-25 SURGICAL SUPPLY — 118 items
ADAPTER CARDIO PERF ANTE/RETRO (ADAPTER) ×4 IMPLANT
ADH SKN CLS APL DERMABOND .7 (GAUZE/BANDAGES/DRESSINGS) ×3
ADPR PRFSN 84XANTGRD RTRGD (ADAPTER) ×3
ADPR TBG 2 MALE LL ART (MISCELLANEOUS) ×3
APL SRG 7X2 LUM MLBL SLNT (VASCULAR PRODUCTS) ×3
APPLICATOR TIP COSEAL (VASCULAR PRODUCTS) ×1 IMPLANT
APPLIER CLIP 9.375 SM OPEN (CLIP) ×4
APR CLP SM 9.3 20 MLT OPN (CLIP) ×3
BAG DECANTER FOR FLEXI CONT (MISCELLANEOUS) ×4 IMPLANT
BLADE CLIPPER SURG (BLADE) ×4 IMPLANT
BLADE STERNUM SYSTEM 6 (BLADE) ×4 IMPLANT
BLADE SURG 15 STRL LF DISP TIS (BLADE) ×3 IMPLANT
BLADE SURG 15 STRL SS (BLADE) ×4
BNDG ELASTIC 4X5.8 VLCR STR LF (GAUZE/BANDAGES/DRESSINGS) ×4 IMPLANT
BNDG ELASTIC 6X5.8 VLCR STR LF (GAUZE/BANDAGES/DRESSINGS) ×4 IMPLANT
BNDG GAUZE ELAST 4 BULKY (GAUZE/BANDAGES/DRESSINGS) ×4 IMPLANT
CANISTER SUCT 3000ML PPV (MISCELLANEOUS) ×4 IMPLANT
CANISTER WOUND CARE 500ML ATS (WOUND CARE) ×1 IMPLANT
CATH CPB KIT HENDRICKSON (MISCELLANEOUS) ×4 IMPLANT
CATH ROBINSON RED A/P 18FR (CATHETERS) ×8 IMPLANT
CLIP APPLIE 9.375 SM OPEN (CLIP) ×3 IMPLANT
CLIP RETRACTION 3.0MM CORONARY (MISCELLANEOUS) ×4 IMPLANT
CLIP VESOCCLUDE MED 24/CT (CLIP) IMPLANT
CLIP VESOCCLUDE SM WIDE 24/CT (CLIP) ×12 IMPLANT
COVER MAYO STAND STRL (DRAPES) ×4 IMPLANT
CUFF TOURN SGL QUICK 18X4 (TOURNIQUET CUFF) IMPLANT
CUFF TOURN SGL QUICK 24 (TOURNIQUET CUFF)
CUFF TRNQT CYL 24X4X16.5-23 (TOURNIQUET CUFF) IMPLANT
DERMABOND ADVANCED (GAUZE/BANDAGES/DRESSINGS) ×1
DERMABOND ADVANCED .7 DNX12 (GAUZE/BANDAGES/DRESSINGS) ×3 IMPLANT
DRAIN CHANNEL 28F RND 3/8 FF (WOUND CARE) ×12 IMPLANT
DRAPE CARDIOVASCULAR INCISE (DRAPES) ×4
DRAPE EXTREMITY T 121X128X90 (DISPOSABLE) ×4 IMPLANT
DRAPE HALF SHEET 40X57 (DRAPES) ×4 IMPLANT
DRAPE SLUSH/WARMER DISC (DRAPES) ×4 IMPLANT
DRAPE SRG 135X102X78XABS (DRAPES) ×3 IMPLANT
DRESSING PEEL AND PLAC PRVNA20 (GAUZE/BANDAGES/DRESSINGS) IMPLANT
DRSG AQUACEL AG ADV 3.5X14 (GAUZE/BANDAGES/DRESSINGS) ×4 IMPLANT
DRSG PEEL AND PLACE PREVENA 20 (GAUZE/BANDAGES/DRESSINGS) ×4
ELECT CAUTERY BLADE 6.4 (BLADE) ×4 IMPLANT
ELECT REM PT RETURN 9FT ADLT (ELECTROSURGICAL) ×8
ELECTRODE REM PT RTRN 9FT ADLT (ELECTROSURGICAL) ×6 IMPLANT
FELT TEFLON 1X6 (MISCELLANEOUS) ×8 IMPLANT
GAUZE SPONGE 4X4 12PLY STRL (GAUZE/BANDAGES/DRESSINGS) ×8 IMPLANT
GAUZE SPONGE 4X4 12PLY STRL LF (GAUZE/BANDAGES/DRESSINGS) ×2 IMPLANT
GEL ULTRASOUND 20GR AQUASONIC (MISCELLANEOUS) ×4 IMPLANT
GLOVE NEODERM STRL 7.5  LF PF (GLOVE) ×9
GLOVE NEODERM STRL 7.5 LF PF (GLOVE) ×9 IMPLANT
GLOVE SURG NEODERM 7.5  LF PF (GLOVE) ×3
GOWN STRL REUS W/ TWL LRG LVL3 (GOWN DISPOSABLE) ×12 IMPLANT
GOWN STRL REUS W/TWL LRG LVL3 (GOWN DISPOSABLE) ×16
HEMOSTAT POWDER SURGIFOAM 1G (HEMOSTASIS) ×8 IMPLANT
INSERT FOGARTY XLG (MISCELLANEOUS) ×4 IMPLANT
INSERT SUTURE HOLDER (MISCELLANEOUS) ×4 IMPLANT
IV ADAPTER SYR DOUBLE MALE LL (MISCELLANEOUS) ×1 IMPLANT
KIT APPLICATOR RATIO 11:1 (KITS) ×1 IMPLANT
KIT BASIN OR (CUSTOM PROCEDURE TRAY) ×4 IMPLANT
KIT SUCTION CATH 14FR (SUCTIONS) ×4 IMPLANT
KIT TURNOVER KIT B (KITS) ×4 IMPLANT
KIT VASOVIEW HEMOPRO 2 VH 4000 (KITS) ×4 IMPLANT
MARKER GRAFT CORONARY BYPASS (MISCELLANEOUS) ×12 IMPLANT
NDL 18GX1X1/2 (RX/OR ONLY) (NEEDLE) ×3 IMPLANT
NEEDLE 18GX1X1/2 (RX/OR ONLY) (NEEDLE) ×4 IMPLANT
NS IRRIG 1000ML POUR BTL (IV SOLUTION) ×20 IMPLANT
PACK E OPEN HEART (SUTURE) ×4 IMPLANT
PACK OPEN HEART (CUSTOM PROCEDURE TRAY) ×4 IMPLANT
PACK PLATELET PROCEDURE 60 (MISCELLANEOUS) ×1 IMPLANT
PACK SPY-PHI (KITS) IMPLANT
PAD ARMBOARD 7.5X6 YLW CONV (MISCELLANEOUS) ×8 IMPLANT
PAD ELECT DEFIB RADIOL ZOLL (MISCELLANEOUS) ×4 IMPLANT
PENCIL BUTTON HOLSTER BLD 10FT (ELECTRODE) ×4 IMPLANT
POSITIONER HEAD DONUT 9IN (MISCELLANEOUS) ×4 IMPLANT
POWDER SURGICEL 3.0 GRAM (HEMOSTASIS) ×4 IMPLANT
PUNCH AORTIC ROTATE  4.5MM 8IN (MISCELLANEOUS) ×1 IMPLANT
SEALANT SURG COSEAL 4ML (VASCULAR PRODUCTS) ×4 IMPLANT
SEALANT SURG COSEAL 8ML (VASCULAR PRODUCTS) ×1 IMPLANT
SET CARDIOPLEGIA MPS 5001102 (MISCELLANEOUS) ×1 IMPLANT
SHEARS HARMONIC 9CM CVD (BLADE) ×1 IMPLANT
SPONGE LAP 18X18 RF (DISPOSABLE) ×1 IMPLANT
STOPCOCK 4 WAY LG BORE MALE ST (IV SETS) ×1 IMPLANT
SUT BONE WAX W31G (SUTURE) ×4 IMPLANT
SUT MNCRL AB 3-0 PS2 18 (SUTURE) ×8 IMPLANT
SUT PDS AB 1 CTX 36 (SUTURE) ×8 IMPLANT
SUT PROLENE 3 0 SH DA (SUTURE) ×5 IMPLANT
SUT PROLENE 5 0 C 1 36 (SUTURE) IMPLANT
SUT PROLENE 6 0 C 1 30 (SUTURE) ×12 IMPLANT
SUT PROLENE 7.0 RB 3 (SUTURE) ×2 IMPLANT
SUT PROLENE 8 0 BV175 6 (SUTURE) ×1 IMPLANT
SUT PROLENE BLUE 7 0 (SUTURE) ×4 IMPLANT
SUT SILK  1 MH (SUTURE) ×4
SUT SILK 1 MH (SUTURE) IMPLANT
SUT SILK 2 0 SH CR/8 (SUTURE) IMPLANT
SUT SILK 3 0 SH CR/8 (SUTURE) IMPLANT
SUT STEEL 6MS V (SUTURE) ×4 IMPLANT
SUT STEEL SZ 6 DBL 3X14 BALL (SUTURE) ×4 IMPLANT
SUT VIC AB 2-0 CT1 27 (SUTURE)
SUT VIC AB 2-0 CT1 TAPERPNT 27 (SUTURE) IMPLANT
SUT VIC AB 2-0 CTX 27 (SUTURE) IMPLANT
SUT VIC AB 3-0 SH 27 (SUTURE)
SUT VIC AB 3-0 SH 27X BRD (SUTURE) IMPLANT
SUT VIC AB 3-0 X1 27 (SUTURE) IMPLANT
SYR 10ML LL (SYRINGE) IMPLANT
SYR 30ML LL (SYRINGE) ×4 IMPLANT
SYR 3ML LL SCALE MARK (SYRINGE) ×4 IMPLANT
SYR 50ML SLIP (SYRINGE) IMPLANT
SYSTEM SAHARA CHEST DRAIN ATS (WOUND CARE) ×4 IMPLANT
TAPE CLOTH SURG 4X10 WHT LF (GAUZE/BANDAGES/DRESSINGS) ×1 IMPLANT
TAPE PAPER 2X10 WHT MICROPORE (GAUZE/BANDAGES/DRESSINGS) ×1 IMPLANT
TIP DUAL SPRAY TOPICAL (TIP) ×2 IMPLANT
TOWEL GREEN STERILE (TOWEL DISPOSABLE) ×4 IMPLANT
TOWEL GREEN STERILE FF (TOWEL DISPOSABLE) ×4 IMPLANT
TRAY CATH LUMEN 1 20CM STRL (SET/KITS/TRAYS/PACK) ×1 IMPLANT
TRAY FOLEY SLVR 16FR TEMP STAT (SET/KITS/TRAYS/PACK) ×4 IMPLANT
TUBING ART PRESS 48 MALE/FEM (TUBING) ×2 IMPLANT
TUBING LAP HI FLOW INSUFFLATIO (TUBING) ×4 IMPLANT
UNDERPAD 30X36 HEAVY ABSORB (UNDERPADS AND DIAPERS) ×4 IMPLANT
WATER STERILE IRR 1000ML POUR (IV SOLUTION) ×8 IMPLANT
WATER STERILE IRR 1000ML UROMA (IV SOLUTION) IMPLANT

## 2020-03-25 NOTE — OR Nursing (Signed)
Forty-five minute call to 2 Heart Charge Nurse at [phone]. Spoke to [place].

## 2020-03-25 NOTE — H&P (Signed)
History and Physical Interval Note:  03/25/2020 7:47 AM  Roy [name]  has presented today for surgery, with the diagnosis of CAD.  The various methods of treatment have been discussed with the patient and family. After consideration of risks, benefits and other options for treatment, the patient has consented to  Procedure(s) with comments: CORONARY ARTERY BYPASS GRAFTING (CABG) (N/A) - possible BIMA possible RADIAL ARTERY HARVEST (Left) TRANSESOPHAGEAL ECHOCARDIOGRAM (TEE) (N/A) INDOCYANINE GREEN FLUORESCENCE IMAGING (ICG) (N/A) as a surgical intervention.  The patient's history has been reviewed, patient examined, no change in status, stable for surgery.  I have reviewed the patient's chart and labs.  Questions were answered to the patient's satisfaction.     [name]

## 2020-03-25 NOTE — Anesthesia Procedure Notes (Signed)
Central Venous Catheter Insertion Performed by: Darral Dash, DO, anesthesiologist Start/End3/02/2020 7:52 AM, 03/25/2020 7:54 AM Patient location: Pre-op. Preanesthetic checklist: patient identified, IV checked, site marked, risks and benefits discussed, surgical consent, monitors and equipment checked, pre-op evaluation, timeout performed and anesthesia consent Position: supine Hand hygiene performed  and maximum sterile barriers used  PA cath was placed.Swan type:thermodilution PA Cath depth:47 Procedure performed without using ultrasound guided technique. Attempts: 1 Patient tolerated the procedure well with no immediate complications.

## 2020-03-25 NOTE — Transfer of Care (Signed)
Immediate Anesthesia Transfer of Care Note  Patient: Roy [name]  Procedure(s) Performed: CORONARY ARTERY BYPASS GRAFTING (CABG), ON PUMP, TIMES FOUR, USING BILATERAL INTERNAL MAMMARY ARTERIES AND LEFT RADIAL ARTERY (N/A Chest) LEFT RADIAL ARTERY HARVEST (Left Arm Lower) TRANSESOPHAGEAL ECHOCARDIOGRAM (TEE) (N/A ) INDOCYANINE GREEN FLUORESCENCE IMAGING (ICG) (N/A )  Patient Location: SICU  Anesthesia Type:General  Level of Consciousness: Patient remains intubated per anesthesia plan  Airway & Oxygen Therapy: Patient remains intubated per anesthesia plan and Patient placed on Ventilator (see vital sign flow sheet for setting)  Post-op Assessment: Report given to RN and Post -op Vital signs reviewed and stable  Post vital signs: Reviewed and stable  Last Vitals:  Vitals Value Taken Time  BP 102/54 03/25/20 1434  Temp    Pulse 99 03/25/20 1439  Resp 29 03/25/20 1439  SpO2 94 % 03/25/20 1439  Vitals shown include unvalidated device data.  Last Pain:  Vitals:   03/25/20 0000  TempSrc: Oral  PainSc:       Patients Stated Pain Goal: 0 (99/23/41 4436)  Complications: No complications documented.

## 2020-03-25 NOTE — OR Nursing (Signed)
Twenty minute call to 2 Heart Charge Nurse at 1345. Spoke to [place]. Cath Lab also notified of timing. Spoke to [employer].

## 2020-03-25 NOTE — Procedures (Signed)
Extubation Procedure Note  Patient Details:   Name: Roy [name] DOB: May 16, 1945 MRN: [id number]   Airway Documentation:    Vent end date: 03/25/20 Vent end time: 1858   Evaluation   O2 sats: stable throughout Complications: No apparent complications Patient did tolerate procedure well. Bilateral Breath Sounds: Rhonchi   Yes   Patient was extubated to a 2L [place] without any complications, dyspnea or stridor noted. NIF: -25, VC: 1.1L, positive cuff leak prior to extubation.   Claretta Fraise 03/25/2020, 6:58 PM

## 2020-03-25 NOTE — Hospital Course (Addendum)
Hospital Course:  Mr. 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  Mr. Hudler noticed acute onset dyspnea after he laid down in his bed on 03/21/2020.  He has some associated epigastric discomfort that he thought was indigestion.  He also had a cough with some pink frothy sputum.  EMS was summoned.  His oxygen saturation was 82% on arrival of the EMS team.  He was placed on nonrebreather and after arrival to the emergency room, his EKG demonstrated sinus tachycardia with PVCs and mild ST depressions in V2 along with nonspecific ST-T wave changes.  His troponin is elevated at 1400.  The cardiology service was consulted and he was ultimately admitted to cardiology with a diagnosis of acute systolic heart failure with non-ST elevation myocardial infarction on 03/21/2020.  CTA was obtained and ruled out pulmonary embolus.  There was, however, demonstration of significant three-vessel coronary calcifications.  Was started on a heparin drip and given aspirin.  He was diuresed resulting in a net output of 2.7 L within 24 hours of admission.  His symptoms improved significantly.  He remained stable.  Left heart catheterization was recommended and carried out earlier today demonstrating severe three-vessel coronary artery disease along with an 80% left main coronary artery stenosis.  Ejection fraction was estimated 25 to 35%.  Echocardiography has been done this admission that shows global left ventricular hypokinesis.  Ejection fraction is estimated 25 to 30% by echo.  There were no significant valve lesions.  It was felt coronary bypass grafting would be indicated and TCTS consult was obtained.    Hospital Course:  The  patient remained chest pain free.  He was evaluated by Dr. [name] who felt the patient would benefit from coronary bypass grafting procedure.  The risks and benefits of the procedure were explained to the patient and he was agreeable to proceed.  He was taken to the operating room on 03/25/2020.  He underwent CABG x 4 utilizing LIMA to LAD, Sequential SVG to OM 1 and OM 2, and RIMA to PDA.  He also underwent open harvest of left radial artery.  He tolerated the procedure without difficulty and was taken to the SICU in stable condition.  The patient was extubated the evening of surgery.  During his stay in the SICU the patient was weaned off Epi, Levophed, and Vasopressin as hemodynamics allowed.  He was transitioned from IV NTG to oral regimen of Isordil for his radial artery harvest.  His chest tubes and arterial lines were removed without difficulty.  He was weaned off Milrinone on 03/29/2020.  He was started on IV lasix to facilitate diuresis.  On 03/29/2020, he had a brief episode of atrial fibrillation that converted back to sinus rhythm prior to IV amiodarone being initiated.  He was then started on an oral amiodarone load.  Chest tubes were removed on 03/29/2020.  DVT prophylaxis was begun with subcutaneous enoxaparin.  Ambulation was encouraged.  He was taken off Milrinone drip on 03/29/2020.  Co-ox remained stable.  The patient was medically stable for transfer to  the progressive care unit on 03/30/2020.  He remained in NSR.  His pacing wires were removed without difficulty.  He was severely hypokalemic and was supplemented accordingly.  Most recent potassium  level is ***.  The patient developed diarrhea.  This was felt to be due to use of stool softeners.  They were discontinued and he was treated with imodium.  His foley catheter was removed without difficulty.  He remained on Flomax and voided without difficulty post catheter removal.  He complained of scrotal swelling which was present prior to surgery due to his  recent left sided orchiectomy and new onset B Cell Lymphoma diagnosis.  The patient is ambulating in the hallway.  His surgical incisions are healing without evidence of infection.  He denies any paraesthesias from his radial artery harvest on his left arm.  He is felt to be medically stable for discharge home today.

## 2020-03-25 NOTE — Progress Notes (Signed)
Report called to [place] at [phone]. Awaiting call back related to heparin gtt.

## 2020-03-25 NOTE — Op Note (Signed)
CARDIOTHORACIC SURGERY OPERATIVE NOTE  Date of Procedure: 03/25/2020  Preoperative Diagnosis: Severe 3-vessel Coronary Artery Disease  Postoperative Diagnosis: Same  Procedure:    Coronary Artery Bypass Grafting x 4  Left Internal Mammary Artery to Distal Left Anterior Descending Coronary Artery; pedicled right internal mammary artery graft to Posterior Descending Coronary Artery; left radial artery graft to first and second obtuse Marginal Branches of Left Circumflex Coronary Artery has a sequenced graft Open left radial artery harvesting Bilateral internal mammary artery harvesting Completion graft surveillance with indocyanine green fluorescence imaging  Surgeon: B.  Murvin Natal, MD  Assistant: Leretha Pol, PA-C  Anesthesia: General  Operative Findings:  Reduced left ventricular systolic function EF 16%  Good quality internal mammary artery conduits  Good quality atrial artery conduit  Good quality target vessels for grafting    BRIEF CLINICAL NOTE AND INDICATIONS FOR SURGERY  75 year old Roy [name] experienced relatively sudden onset shortness of breath earlier this week.  He was transported to the hospital by EMS and was diagnosed with NSTEMI.  He underwent left heart catheterization demonstrating severe multivessel coronary artery disease.  Consultation was received for CABG.  He has been thoroughly evaluated and is considered a good candidate for surgery.  He understands the risks and benefits and wishes to proceed   DETAILS OF THE OPERATIVE PROCEDURE  Preparation:  The patient is brought to the operating room on the above mentioned date and central monitoring was established by the anesthesia team including placement of Swan-Ganz catheter and radial arterial line. The patient is placed in the supine position on the operating table.  Intravenous antibiotics are administered. General endotracheal anesthesia is induced uneventfully. A Foley catheter is placed.  Baseline  transesophageal echocardiogram was performed.  Findings were notable for reduced LV function with EF approximately 30%;  no significant valvular disease  The patient's chest, abdomen, the left upper extremity, both groins, and both lower extremities are prepared and draped in a sterile manner. A time out procedure is performed.   Surgical Approach and Conduit Harvest:  Attention was first turned to the left upper extremity where open harvesting of the left radial artery was performed.  When the graft is removed, the wound is closed in layers and the arm is tucked at the left side.  A median sternotomy incision was performed and the left internal mammary artery is dissected from the chest wall and prepared for bypass grafting. The left internal mammary artery is notably good quality conduit.  Next, attention is turned to the right hemithorax with right internal mammary artery and its pedicle mobilized in a standard fashion.  Prior to dividing the pedicle distally full dose heparin is given intravenously.  Following systemic heparinization, the internal mammary artery conduits were transected distally noted to have excellent flow.  Both IMA pedicles were treated with a solution of papaverine.   Extracorporeal Cardiopulmonary Bypass and Myocardial Protection:  The pericardium is opened. The ascending aorta is nondiseased in appearance. The ascending aorta and the right atrium are cannulated for cardiopulmonary bypass.  Adequate heparinization is verified.    The entire pre-bypass portion of the operation was notable for stable hemodynamics.  Cardiopulmonary bypass was begun and the surface of the heart is inspected. Distal target vessels are selected for coronary artery bypass grafting. A cardioplegia cannula is placed in the ascending aorta.   The patient is allowed to cool passively to 34C systemic temperature.  The aortic cross clamp is applied and cold blood cardioplegia is delivered initially in  an antegrade fashion through the aortic root. Iced saline slush is applied for topical hypothermia.  The initial cardioplegic arrest is rapid with early diastolic arrest.  Repeat doses of cardioplegia are administered intermittently throughout the entire cross clamp portion of the operation through the aortic root and through subsequently placed  grafts in order to maintain completely flat electrocardiogram.   Coronary Artery Bypass Grafting:   The  posterior descending branch of the right coronary artery was grafted using the pedicled right internal mammary artery graft in an end-to-side fashion.  At the site of distal anastomosis the target vessel was good quality and measured approximately 1.5 mm in diameter.Anastomotic patency and runoff was confirmed with indocyanine green fluorescence imaging (SPY).  The first and second obtuse marginal branches of the left circumflex coronary artery were grafted using the left radial artery graft in a sequenced fashion.  At the site of distal anastomosis the target vessel were good quality and measured approximately 1.5 mm in diameter.    The distal left anterior coronary artery was grafted with the left internal mammary artery in an end-to-side fashion.  At the site of distal anastomosis the target vessel was good quality and measured approximately 1.5 mm in diameter. Anastomotic patency and runoff was confirmed with indocyanine green fluorescence imaging (SPY).  The proximal radial artery graft anastomosis was placed directly to the ascending aorta prior to removal of the aortic cross clamp.  A reanimation dose of cardioplegia was given down the aortic root.  De-airing procedures were performed and the aortic cross-clamp was removed.  Procedure Completion:  All proximal and distal coronary anastomoses were inspected for hemostasis and appropriate graft orientation. Epicardial pacing wires are fixed to the right ventricular outflow tract and to the right  atrial appendage. The patient is rewarmed to 37C temperature. The patient is weaned and disconnected from cardiopulmonary bypass.  The patient's rhythm at separation from bypass was sinus bradycardia.  The patient was weaned from cardiopulmonary bypass with moderate inotropic support.   Followup transesophageal echocardiogram performed after separation from bypass revealed no changes from the preoperative exam.  The aortic and venous cannula were removed uneventfully. Protamine was administered to reverse the anticoagulation. The mediastinum and pleural space were inspected for hemostasis and irrigated with saline solution. The mediastinum and both pleural spaces were drained using fluted chest tubes placed through separate stab incisions inferiorly.  The soft tissues anterior to the aorta were reapproximated loosely. The sternum is closed with double strength sternal wire. The soft tissues anterior to the sternum were closed in multiple layers and the skin is closed with a running subcuticular skin closure.  The post-bypass portion of the operation was notable for stable rhythm and hemodynamics.     Disposition:  The patient tolerated the procedure well and is transported to the surgical intensive care in stable condition. There are no intraoperative complications. All sponge instrument and needle counts are verified correct at completion of the operation.    Jayme Cloud, MD 03/25/2020 3:04 PM

## 2020-03-25 NOTE — Progress Notes (Signed)
EVENING ROUNDS NOTE :     [address]       [place] [address]             [phone]                 Day of Surgery Procedure(s) (LRB): CORONARY ARTERY BYPASS GRAFTING (CABG), ON PUMP, TIMES FOUR, USING BILATERAL INTERNAL MAMMARY ARTERIES AND LEFT RADIAL ARTERY (N/A) LEFT RADIAL ARTERY HARVEST (Left) TRANSESOPHAGEAL ECHOCARDIOGRAM (TEE) (N/A) INDOCYANINE GREEN FLUORESCENCE IMAGING (ICG) (N/A)   Total Length of Stay:  LOS: 4 days  Events:   No events Extubated Moderate CT output    BP 93/68   Pulse 91   Temp 97.7 F (36.5 C)   Resp (!) 32   Ht 6\' 1"  (1.854 m)   Wt 97.6 kg   SpO2 100%   BMI 28.39 kg/m   PAP: (26-43)/(14-26) 43/25 CO:  [4.1 L/min-4.6 L/min] 4.5 L/min CI:  [1.8 L/min/m2-2.1 L/min/m2] 2 L/min/m2  Vent Mode: PSV;CPAP FiO2 (%):  [40 %-50 %] 40 % Set Rate:  [4 bmp-12 bmp] 4 bmp Vt Set:  [630 mL] 630 mL PEEP:  [5 cmH20] 5 cmH20 Pressure Support:  [10 cmH20] 10 cmH20 Plateau Pressure:  [17 cmH20] 17 cmH20  . sodium chloride 20 mL/hr at 03/25/20 1900  . sodium chloride    . [START ON 03/26/2020] sodium chloride    . sodium chloride 10 mL/hr at 03/25/20 1500  . albumin human 12.5 g (03/25/20 1500)  . cefUROXime (ZINACEF)  IV Stopped (03/25/20 1708)  . dexmedetomidine (PRECEDEX) IV infusion 0.1 mcg/kg/hr (03/25/20 1900)  . electrolyte-A 75 mL/hr at 03/25/20 1900  . epinephrine 5 mcg/min (03/25/20 1900)  . famotidine (PEPCID) IV Stopped (03/25/20 1635)  . insulin 3.6 mL/hr at 03/25/20 1900  . lactated ringers    . lactated ringers    . lactated ringers 20 mL/hr at 03/25/20 1600  . magnesium sulfate 20 mL/hr at 03/25/20 1900  . nitroGLYCERIN 5 mcg/min (03/25/20 1900)  . norepinephrine (LEVOPHED) Adult infusion 9 mcg/min (03/25/20 1900)  . phenylephrine (NEO-SYNEPHRINE) Adult infusion 10 mcg/min (03/25/20 1900)  . vancomycin    . vasopressin 0.03 Units/min (03/25/20 1900)    I/O last 3 completed shifts: In: 4617.5 [P.O.:125;  I.V.:3199.7; Blood:485; IV Piggyback:807.8] Out: 1540 [GQQPY:1950; Blood:1560; Chest Tube:370]   CBC Latest Ref Rng & Units 03/25/2020 03/25/2020 03/25/2020  WBC 4.0 - 10.5 K/uL - 28.0(H) -  Hemoglobin 13.0 - 17.0 g/dL 9.5(L) 10.0(L) 9.2(L)  Hematocrit 39.0 - 52.0 % 28.0(L) 29.3(L) 27.0(L)  Platelets 150 - 400 K/uL - 215 -    BMP Latest Ref Rng & Units 03/25/2020 03/25/2020 03/25/2020  Glucose 70 - 99 mg/dL - 197(H) -  BUN 8 - 23 mg/dL - 12 -  Creatinine 0.61 - 1.24 mg/dL - 0.60(L) -  Sodium 135 - 145 mmol/L 142 140 140  Potassium 3.5 - 5.1 mmol/L 4.0 3.9 3.9  Chloride 98 - 111 mmol/L - 106 -  CO2 22 - 32 mmol/L - - -  Calcium 8.9 - 10.3 mg/dL - - -    ABG    Component Value Date/Time   PHART 7.343 (L) 03/25/2020 1504   PCO2ART 32.5 03/25/2020 1504   PO2ART 94 03/25/2020 1504   HCO3 17.8 (L) 03/25/2020 1504   TCO2 19 (L) 03/25/2020 1504   ACIDBASEDEF 7.0 (H) 03/25/2020 1504   O2SAT 97.0 03/25/2020 1504       Melodie Bouillon, MD 03/25/2020 7:39 PM

## 2020-03-25 NOTE — Anesthesia Procedure Notes (Signed)
Procedure Name: Intubation Date/Time: 03/25/2020 8:52 AM Performed by: Amadeo Garnet, CRNA Pre-anesthesia Checklist: Patient identified, Emergency Drugs available, Suction available and Patient being monitored Patient Re-evaluated:Patient Re-evaluated prior to induction Oxygen Delivery Method: Circle system utilized Preoxygenation: Pre-oxygenation with 100% oxygen Induction Type: IV induction Ventilation: Mask ventilation without difficulty and Oral airway inserted - appropriate to patient size Laryngoscope Size: Mac and 4 Grade View: Grade I Tube type: Oral Tube size: 8.0 mm Number of attempts: 1 Airway Equipment and Method: Stylet and Oral airway Placement Confirmation: ETT inserted through vocal cords under direct vision,  positive ETCO2 and breath sounds checked- equal and bilateral Secured at: 23 cm Tube secured with: Tape Dental Injury: Teeth and Oropharynx as per pre-operative assessment

## 2020-03-25 NOTE — Progress Notes (Signed)
Pt transported to pre op via stretcher. Nurse accompanied pt to pre op.

## 2020-03-25 NOTE — Anesthesia Procedure Notes (Signed)
Central Venous Catheter Insertion Performed by: Darral Dash, DO, anesthesiologist Start/End3/02/2020 7:45 AM, 03/25/2020 7:52 AM Patient location: Pre-op. Preanesthetic checklist: patient identified, IV checked, site marked, risks and benefits discussed, surgical consent, monitors and equipment checked, pre-op evaluation, timeout performed and anesthesia consent Position: Trendelenburg Lidocaine 1% used for infiltration and patient sedated Hand hygiene performed , maximum sterile barriers used  and Seldinger technique used Catheter size: 9 Fr Central line was placed.MAC introducer Procedure performed using ultrasound guided technique. Ultrasound Notes:anatomy identified, needle tip was noted to be adjacent to the nerve/plexus identified, no ultrasound evidence of intravascular and/or intraneural injection and image(s) printed for medical record Attempts: 1 Following insertion, line sutured, dressing applied and Biopatch. Post procedure assessment: blood return through all ports, free fluid flow and no air  Patient tolerated the procedure well with no immediate complications.

## 2020-03-25 NOTE — Discharge Instructions (Signed)

## 2020-03-25 NOTE — Anesthesia Procedure Notes (Signed)
Arterial Line Insertion Start/End3/02/2020 7:40 AM, 03/25/2020 7:45 AM Performed by: Amadeo Garnet, CRNA, CRNA  Patient location: Pre-op. Preanesthetic checklist: patient identified, IV checked, site marked, risks and benefits discussed, surgical consent, monitors and equipment checked, pre-op evaluation, timeout performed and anesthesia consent Lidocaine 1% used for infiltration and patient sedated Right, radial was placed Catheter size: 20 G Hand hygiene performed  and maximum sterile barriers used   Attempts: 1 Procedure performed without using ultrasound guided technique. Following insertion, dressing applied and Biopatch. Post procedure assessment: normal  Patient tolerated the procedure well with no immediate complications.

## 2020-03-25 NOTE — Brief Op Note (Signed)
03/21/2020 - 03/25/2020  12:41 PM  PATIENT:  Roy [name]  75 y.o. male  PRE-OPERATIVE DIAGNOSIS:  coronary artery disease   POST-OPERATIVE DIAGNOSIS:  coronary artery disease  PROCEDURE:  Procedure(s):  CORONARY ARTERY BYPASS GRAFTING x 4 -LIMA to LAD -SEQ RADIAL ARTERY to OM 1 and OM 2 -RIMA to PDA  OPEN LEFT RADIAL ARTERY HARVEST -30 min/5  TRANSESOPHAGEAL ECHOCARDIOGRAM (TEE) (N/A)  INDOCYANINE Xandra Laramee FLUORESCENCE IMAGING (ICG) (N/A)  SURGEON:  Surgeon(s) and Role:    * [name], MD - Primary  PHYSICIAN ASSISTANT: Ellwood Handler PA-C  ANESTHESIA:   general  EBL:  Per anes record   BLOOD ADMINISTERED:CELLSAVER  DRAINS: Left and Right Pleural Chest Tubes, Mediastinal Chest Drains   LOCAL MEDICATIONS USED:  NONE  SPECIMEN:  No Specimen  DISPOSITION OF SPECIMEN:  N/A  COUNTS:  YES  TOURNIQUET:  * No tourniquets in log *  DICTATION: .Dragon Dictation  PLAN OF CARE: Admit to inpatient   PATIENT DISPOSITION:  ICU - intubated and hemodynamically stable.   Delay start of Pharmacological VTE agent (>24hrs) due to surgical blood loss or risk of bleeding: yes

## 2020-03-25 NOTE — Progress Notes (Signed)
  Echocardiogram Echocardiogram Transesophageal has been performed.  Roy [name] 03/25/2020, 9:10 AM

## 2020-03-26 ENCOUNTER — Inpatient Hospital Stay (HOSPITAL_COMMUNITY): Payer: Medicare HMO

## 2020-03-26 ENCOUNTER — Encounter (HOSPITAL_COMMUNITY): Payer: Self-pay | Admitting: Cardiothoracic Surgery

## 2020-03-26 LAB — BASIC METABOLIC PANEL
Anion gap: 11 (ref 5–15)
Anion gap: 9 (ref 5–15)
BUN: 13 mg/dL (ref 8–23)
BUN: 15 mg/dL (ref 8–23)
CO2: 16 mmol/L — ABNORMAL LOW (ref 22–32)
CO2: 17 mmol/L — ABNORMAL LOW (ref 22–32)
Calcium: 7.7 mg/dL — ABNORMAL LOW (ref 8.9–10.3)
Calcium: 7.1 mg/dL — ABNORMAL LOW (ref 8.9–10.3)
Chloride: 110 mmol/L (ref 98–111)
Chloride: 107 mmol/L (ref 98–111)
Creatinine, Ser: 1.03 mg/dL (ref 0.61–1.24)
Creatinine, Ser: 0.92 mg/dL (ref 0.61–1.24)
GFR, Estimated: >60 mL/min (ref >60)
GFR, Estimated: >60 mL/min (ref >60)
Glucose, Bld: 134 mg/dL — ABNORMAL HIGH (ref 70–99)
Glucose, Bld: 140 mg/dL — ABNORMAL HIGH (ref 70–99)
Potassium: 4.6 mmol/L (ref 3.5–5.1)
Potassium: 3.8 mmol/L (ref 3.5–5.1)
Sodium: 137 mmol/L (ref 135–145)
Sodium: 133 mmol/L — ABNORMAL LOW (ref 135–145)

## 2020-03-26 LAB — GLUCOSE, CAPILLARY
Glucose-Capillary: 123 mg/dL — ABNORMAL HIGH (ref 70–99)
Glucose-Capillary: 124 mg/dL — ABNORMAL HIGH (ref 70–99)
Glucose-Capillary: 126 mg/dL — ABNORMAL HIGH (ref 70–99)
Glucose-Capillary: 128 mg/dL — ABNORMAL HIGH (ref 70–99)
Glucose-Capillary: 130 mg/dL — ABNORMAL HIGH (ref 70–99)
Glucose-Capillary: 133 mg/dL — ABNORMAL HIGH (ref 70–99)
Glucose-Capillary: 137 mg/dL — ABNORMAL HIGH (ref 70–99)
Glucose-Capillary: 137 mg/dL — ABNORMAL HIGH (ref 70–99)
Glucose-Capillary: 138 mg/dL — ABNORMAL HIGH (ref 70–99)
Glucose-Capillary: 139 mg/dL — ABNORMAL HIGH (ref 70–99)
Glucose-Capillary: 141 mg/dL — ABNORMAL HIGH (ref 70–99)
Glucose-Capillary: 142 mg/dL — ABNORMAL HIGH (ref 70–99)
Glucose-Capillary: 183 mg/dL — ABNORMAL HIGH (ref 70–99)
Glucose-Capillary: 124 mg/dL — ABNORMAL HIGH (ref 70–99)

## 2020-03-26 LAB — CBC
HCT: 25.1 % — ABNORMAL LOW (ref 39.0–52.0)
HCT: 26.4 % — ABNORMAL LOW (ref 39.0–52.0)
Hemoglobin: 8.6 g/dL — ABNORMAL LOW (ref 13.0–17.0)
Hemoglobin: 9.3 g/dL — ABNORMAL LOW (ref 13.0–17.0)
MCH: 31.3 pg (ref 26.0–34.0)
MCH: 31.2 pg (ref 26.0–34.0)
MCHC: 34.3 g/dL (ref 30.0–36.0)
MCHC: 35.2 g/dL (ref 30.0–36.0)
MCV: 91.3 fL (ref 80.0–100.0)
MCV: 88.6 fL (ref 80.0–100.0)
Platelets: 178 10*3/uL (ref 150–400)
Platelets: 171 10*3/uL (ref 150–400)
RBC: 2.75 MIL/uL — ABNORMAL LOW (ref 4.22–5.81)
RBC: 2.98 MIL/uL — ABNORMAL LOW (ref 4.22–5.81)
RDW: 13.8 % (ref 11.5–15.5)
RDW: 14.5 % (ref 11.5–15.5)
WBC: 22.5 10*3/uL — ABNORMAL HIGH (ref 4.0–10.5)
WBC: 21.1 10*3/uL — ABNORMAL HIGH (ref 4.0–10.5)
nRBC: 0 % (ref 0.0–0.2)
nRBC: 0 % (ref 0.0–0.2)

## 2020-03-26 LAB — MAGNESIUM
Magnesium: 2.4 mg/dL (ref 1.7–2.4)
Magnesium: 2 mg/dL (ref 1.7–2.4)

## 2020-03-26 LAB — COOXEMETRY PANEL
Carboxyhemoglobin: 1.2 % (ref 0.5–1.5)
Methemoglobin: 0.7 % (ref 0.0–1.5)
O2 Saturation: 56.3 %
Total hemoglobin: 9.4 g/dL — ABNORMAL LOW (ref 12.0–16.0)

## 2020-03-26 LAB — PREPARE RBC (CROSSMATCH)

## 2020-03-26 IMAGING — DX DG CHEST 1V PORT
1 series · 1 of 1 positions shown · non-contrast
Comparison: [DATE]

CLINICAL DATA: Chest tube

EXAM:
PORTABLE CHEST 1 VIEW

[chest ap]
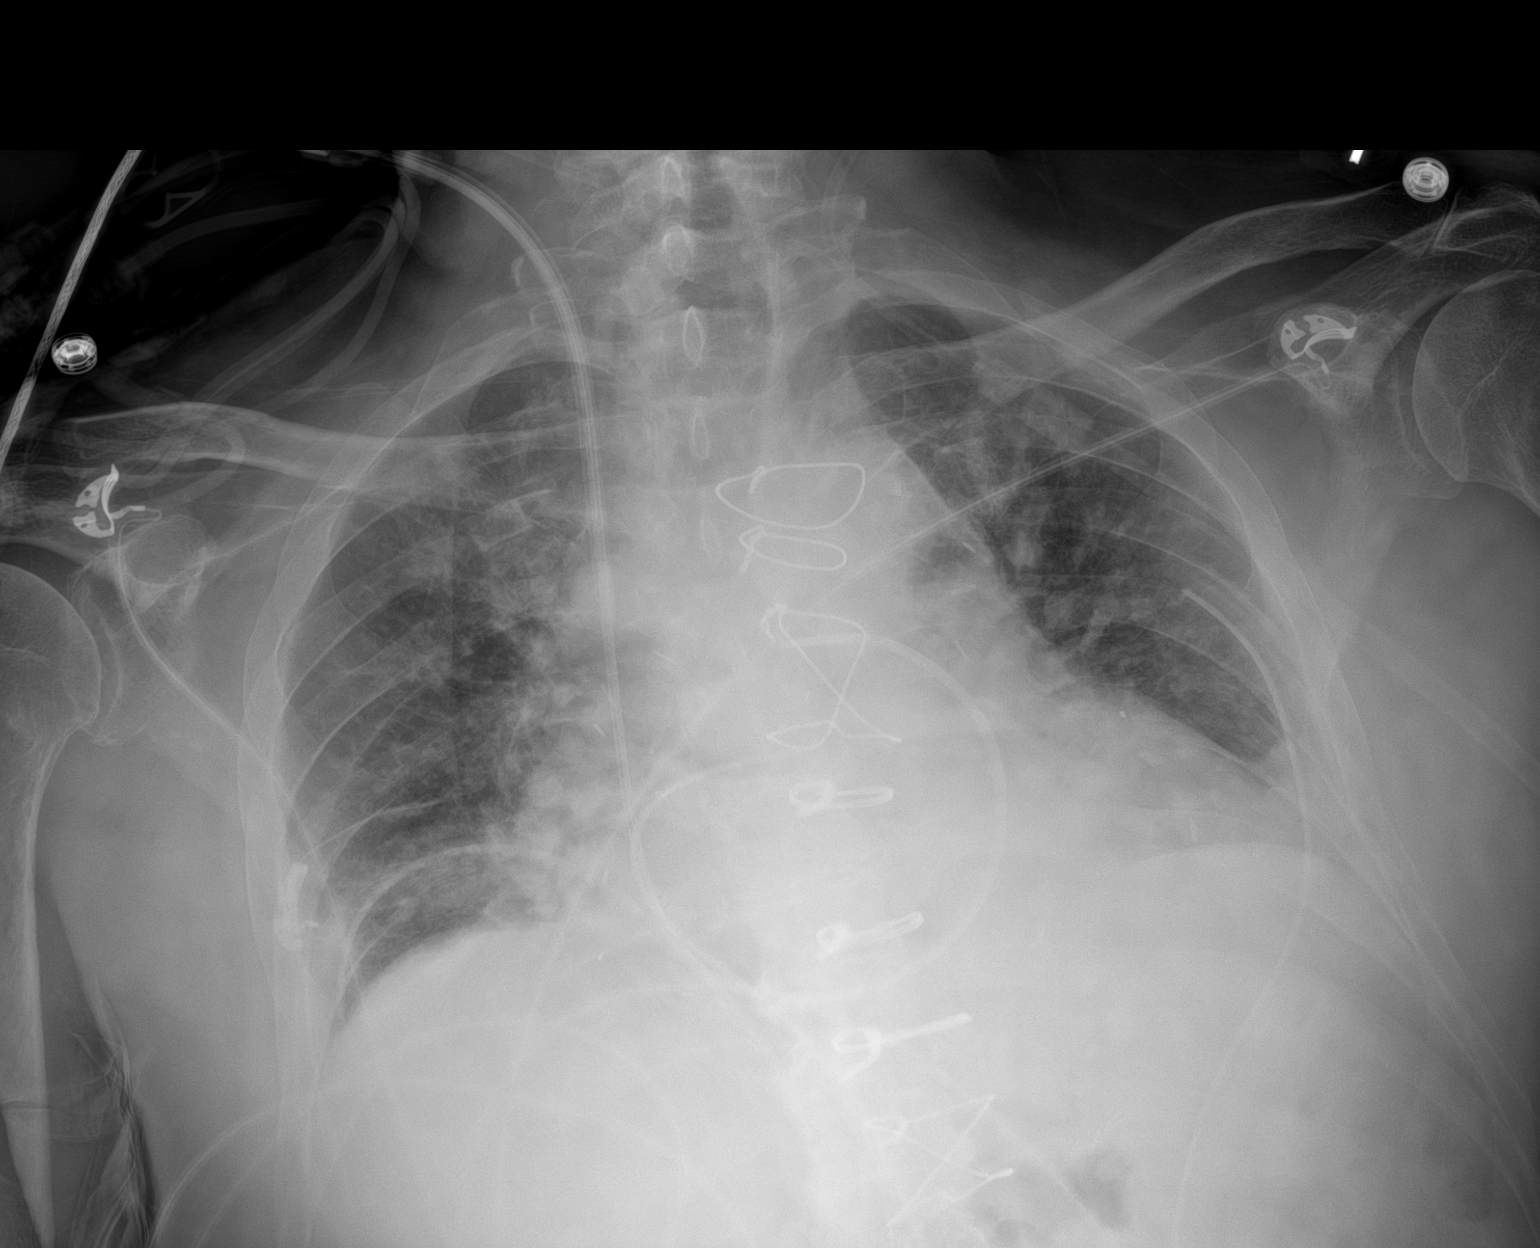

[1 of 1 positions shown; findings below may reference images not displayed]

FINDINGS: Swan-Ganz catheter slightly advanced into the central right
pulmonary artery. Left chest tube remains in place. No pneumothorax.
Interval extubation and removal of NG tube. Low lung volumes with
vascular congestion. Bibasilar atelectasis, increasing since prior
study.
IMPRESSION: Interval extubation. Worsening aeration of the lungs with increasing
vascular congestion and bibasilar atelectasis.

No pneumothorax.

## 2020-03-26 MED ORDER — THIAMINE HCL 100 MG/ML IJ SOLN
Freq: Once | INTRAVENOUS | Status: AC
  Filled 2020-03-26: qty 1000

## 2020-03-26 MED ORDER — MILRINONE LACTATE IN DEXTROSE 20-5 MG/100ML-% IV SOLN
0.1250 ug/kg/min | INTRAVENOUS | Status: DC
  Administered 2020-03-26: 0.25 ug/kg/min via INTRAVENOUS
  Administered 2020-03-28: 0.125 ug/kg/min via INTRAVENOUS
  Filled 2020-03-26 (×3): qty 100

## 2020-03-26 MED ORDER — COLCHICINE 0.3 MG HALF TABLET
0.3000 mg | ORAL_TABLET | Freq: Two times a day (BID) | ORAL | Status: DC
  Administered 2020-03-26 – 2020-03-30 (×10): 0.3 mg via ORAL
  Filled 2020-03-26 (×12): qty 1

## 2020-03-26 MED ORDER — ISOSORBIDE DINITRATE 10 MG PO TABS
5.0000 mg | ORAL_TABLET | Freq: Two times a day (BID) | ORAL | Status: DC
  Administered 2020-03-26 – 2020-03-30 (×9): 5 mg via ORAL
  Filled 2020-03-26 (×9): qty 1

## 2020-03-26 MED ORDER — FE FUMARATE-B12-VIT C-FA-IFC PO CAPS
1.0000 | ORAL_CAPSULE | Freq: Two times a day (BID) | ORAL | Status: DC
  Administered 2020-03-27 – 2020-03-30 (×8): 1 via ORAL
  Filled 2020-03-26 (×9): qty 1

## 2020-03-26 MED ORDER — SODIUM CHLORIDE 0.9% IV SOLUTION: Freq: Once | INTRAVENOUS | Status: AC

## 2020-03-26 MED FILL — Calcium Chloride Inj 10%: INTRAVENOUS | Qty: 10 | Status: AC

## 2020-03-26 MED FILL — Mannitol IV Soln 20%: INTRAVENOUS | Qty: 500 | Status: AC

## 2020-03-26 MED FILL — Heparin Sodium (Porcine) Inj 1000 Unit/ML: INTRAMUSCULAR | Qty: 30 | Status: AC

## 2020-03-26 MED FILL — Electrolyte-R (PH 7.4) Solution: INTRAVENOUS | Qty: 4000 | Status: AC

## 2020-03-26 MED FILL — Potassium Chloride Inj 2 mEq/ML: INTRAVENOUS | Qty: 40 | Status: AC

## 2020-03-26 MED FILL — Sodium Chloride IV Soln 0.9%: INTRAVENOUS | Qty: 5000 | Status: AC

## 2020-03-26 MED FILL — Sodium Bicarbonate IV Soln 8.4%: INTRAVENOUS | Qty: 50 | Status: AC

## 2020-03-26 MED FILL — Magnesium Sulfate Inj 50%: INTRAMUSCULAR | Qty: 10 | Status: AC

## 2020-03-26 MED FILL — Heparin Sodium (Porcine) Inj 1000 Unit/ML: INTRAMUSCULAR | Qty: 10 | Status: AC

## 2020-03-26 NOTE — Progress Notes (Signed)
      [address]       [place] [address]             [phone]      POD 1 s/p CABG   Afternoon labs show stable renal function, H and H up to 9.3/26.4 s/p transfusion. WBC remains elevated but trending down.  On insulin gtt, Milrinone 0.25, Levo 10  Chest tube drainage trending down, 290cc since 8am  A-line with dampened wave form and not correlating  with cuff pressure.   Stable this evening.Nicholes Rough, PA-C

## 2020-03-26 NOTE — Anesthesia Postprocedure Evaluation (Signed)
Anesthesia Post Note  Patient: Roy [name]  Procedure(s) Performed: CORONARY ARTERY BYPASS GRAFTING (CABG), ON PUMP, TIMES FOUR, USING BILATERAL INTERNAL MAMMARY ARTERIES AND LEFT RADIAL ARTERY (N/A Chest) LEFT RADIAL ARTERY HARVEST (Left Arm Lower) TRANSESOPHAGEAL ECHOCARDIOGRAM (TEE) (N/A ) INDOCYANINE GREEN FLUORESCENCE IMAGING (ICG) (N/A )     Patient location during evaluation: SICU Anesthesia Type: General Level of consciousness: sedated Pain management: pain level controlled Vital Signs Assessment: post-procedure vital signs reviewed and stable Respiratory status: patient remains intubated per anesthesia plan Cardiovascular status: stable Postop Assessment: no apparent nausea or vomiting Anesthetic complications: no   No complications documented.  Last Vitals:  Vitals:   03/26/20 0600 03/26/20 0700  BP: 95/60 104/74  Pulse: 86 88  Resp: (!) 26 (!) 31  Temp: 37.3 C 37.2 C  SpO2: 94% 96%    Last Pain:  Vitals:   03/26/20 0551  TempSrc:   PainSc: 3                  Therman Hughlett P Kalysta Kneisley

## 2020-03-26 NOTE — Plan of Care (Signed)
  Problem: Cardiac: Goal: Will achieve and/or maintain hemodynamic stability Outcome: Progressing   Problem: Respiratory: Goal: Respiratory status will improve Outcome: Progressing   Problem: Urinary Elimination: Goal: Ability to achieve and maintain adequate renal perfusion and functioning will improve Outcome: Progressing   Problem: Education: Goal: Will demonstrate proper wound care and an understanding of methods to prevent future damage Outcome: Progressing

## 2020-03-26 NOTE — Progress Notes (Signed)
1 Day Post-Op Procedure(s) (LRB): CORONARY ARTERY BYPASS GRAFTING (CABG), ON PUMP, TIMES FOUR, USING BILATERAL INTERNAL MAMMARY ARTERIES AND LEFT RADIAL ARTERY (N/A) LEFT RADIAL ARTERY HARVEST (Left) TRANSESOPHAGEAL ECHOCARDIOGRAM (TEE) (N/A) INDOCYANINE GREEN FLUORESCENCE IMAGING (ICG) (N/A) Subjective: Feeling "weak"; no pain  Objective: Vital signs in last 24 hours: Temp:  [97.16 F (36.2 C)-99.86 F (37.7 C)] 98.78 F (37.1 C) (03/03 0934) Pulse Rate:  [84-103] 85 (03/03 0934) Cardiac Rhythm: Normal sinus rhythm (03/03 0000) Resp:  [14-56] 28 (03/03 0934) BP: (86-107)/(54-74) 107/64 (03/03 0900) SpO2:  [92 %-100 %] 96 % (03/03 0934) Arterial Line BP: (68-134)/(45-80) 68/59 (03/03 0934) FiO2 (%):  [40 %-50 %] 40 % (03/02 1812) Weight:  [101.7 kg] 101.7 kg (03/03 0600)  Hemodynamic parameters for last 24 hours: PAP: (26-69)/(14-26) 39/20 CO:  [3.7 L/min-4.6 L/min] 3.8 L/min CI:  [1.7 L/min/m2-2.1 L/min/m2] 1.7 L/min/m2  Intake/Output from previous day: 03/02 0701 - 03/03 0700 In: 7024.6 [P.O.:240; I.V.:5116.6; Blood:485; IV HERDEYCXK:4818] Out: 5631 [Urine:1420; Blood:1560; Chest Tube:1190] Intake/Output this shift: Total I/O In: -  Out: 250 [Chest Tube:250]  General appearance: alert and cooperative Neurologic: intact Heart: regular rate and rhythm, S1, S2 normal, no murmur, click, rub or gallop Lungs: clear to auscultation bilaterally Abdomen: soft, non-tender; bowel sounds normal; no masses,  no organomegaly Extremities: edema mild; LUE warm/dry Wound: c/d/i  Lab Results: Recent Labs    03/25/20 2100 03/26/20 0410  WBC 23.5* 22.5*  HGB 9.6* 8.6*  HCT 29.1* 25.1*  PLT 204 178   BMET:  Recent Labs    03/25/20 2100 03/26/20 0410  NA 135 137  K 3.9 4.6  CL 109 110  CO2 16* 16*  GLUCOSE 161* 134*  BUN 13 13  CREATININE 0.97 1.03  CALCIUM 7.4* 7.7*    PT/INR:  Recent Labs    03/25/20 1500  LABPROT 16.6*  INR 1.4*   ABG    Component Value  Date/Time   PHART 7.343 (L) 03/25/2020 1504   HCO3 17.8 (L) 03/25/2020 1504   TCO2 19 (L) 03/25/2020 1504   ACIDBASEDEF 7.0 (H) 03/25/2020 1504   O2SAT 97.0 03/25/2020 1504   CBG (last 3)  Recent Labs    03/24/20 2117 03/25/20 1502 03/25/20 1556  GLUCAP 141* 154* 138*    Assessment/Plan: S/P Procedure(s) (LRB): CORONARY ARTERY BYPASS GRAFTING (CABG), ON PUMP, TIMES FOUR, USING BILATERAL INTERNAL MAMMARY ARTERIES AND LEFT RADIAL ARTERY (N/A) LEFT RADIAL ARTERY HARVEST (Left) TRANSESOPHAGEAL ECHOCARDIOGRAM (TEE) (N/A) INDOCYANINE GREEN FLUORESCENCE IMAGING (ICG) (N/A) Mobilize txfuse one unit PRBC  Resuscitate Add milrinone for low CI Keep PA cath Repeat labs this pm Keep insulin gtt this am  LOS: 5 days    [name] 03/26/2020

## 2020-03-27 ENCOUNTER — Inpatient Hospital Stay (HOSPITAL_COMMUNITY): Payer: Medicare HMO

## 2020-03-27 ENCOUNTER — Encounter (HOSPITAL_COMMUNITY): Payer: Self-pay | Admitting: Urology

## 2020-03-27 DIAGNOSIS — Z951 Presence of aortocoronary bypass graft: Secondary | ICD-10-CM

## 2020-03-27 DIAGNOSIS — I214 Non-ST elevation (NSTEMI) myocardial infarction: Secondary | ICD-10-CM | POA: Diagnosis not present

## 2020-03-27 LAB — POCT I-STAT 7, (LYTES, BLD GAS, ICA,H+H)
Acid-base deficit: 6 mmol/L — ABNORMAL HIGH (ref 0.0–2.0)
Acid-base deficit: 7 mmol/L — ABNORMAL HIGH (ref 0.0–2.0)
Bicarbonate: 18.8 mmol/L — ABNORMAL LOW (ref 20.0–28.0)
Bicarbonate: 17.9 mmol/L — ABNORMAL LOW (ref 20.0–28.0)
Calcium, Ion: 1.15 mmol/L (ref 1.15–1.40)
Calcium, Ion: 1.15 mmol/L (ref 1.15–1.40)
HCT: 28 % — ABNORMAL LOW (ref 39.0–52.0)
HCT: 25 % — ABNORMAL LOW (ref 39.0–52.0)
Hemoglobin: 9.5 g/dL — ABNORMAL LOW (ref 13.0–17.0)
Hemoglobin: 8.5 g/dL — ABNORMAL LOW (ref 13.0–17.0)
O2 Saturation: 99 %
O2 Saturation: 94 %
Patient temperature: 36.4
Patient temperature: 37
Potassium: 3.8 mmol/L (ref 3.5–5.1)
Potassium: 3.9 mmol/L (ref 3.5–5.1)
Sodium: 142 mmol/L (ref 135–145)
Sodium: 141 mmol/L (ref 135–145)
TCO2: 20 mmol/L — ABNORMAL LOW (ref 22–32)
TCO2: 19 mmol/L — ABNORMAL LOW (ref 22–32)
pCO2 arterial: 31 mmHg — ABNORMAL LOW (ref 32.0–48.0)
pCO2 arterial: 31.9 mmHg — ABNORMAL LOW (ref 32.0–48.0)
pH, Arterial: 7.388 (ref 7.350–7.450)
pH, Arterial: 7.356 (ref 7.350–7.450)
pO2, Arterial: 116 mmHg — ABNORMAL HIGH (ref 83.0–108.0)
pO2, Arterial: 73 mmHg — ABNORMAL LOW (ref 83.0–108.0)

## 2020-03-27 LAB — GLUCOSE, CAPILLARY
Glucose-Capillary: 111 mg/dL — ABNORMAL HIGH (ref 70–99)
Glucose-Capillary: 114 mg/dL — ABNORMAL HIGH (ref 70–99)
Glucose-Capillary: 118 mg/dL — ABNORMAL HIGH (ref 70–99)
Glucose-Capillary: 120 mg/dL — ABNORMAL HIGH (ref 70–99)
Glucose-Capillary: 122 mg/dL — ABNORMAL HIGH (ref 70–99)
Glucose-Capillary: 123 mg/dL — ABNORMAL HIGH (ref 70–99)
Glucose-Capillary: 128 mg/dL — ABNORMAL HIGH (ref 70–99)
Glucose-Capillary: 129 mg/dL — ABNORMAL HIGH (ref 70–99)
Glucose-Capillary: 129 mg/dL — ABNORMAL HIGH (ref 70–99)
Glucose-Capillary: 130 mg/dL — ABNORMAL HIGH (ref 70–99)
Glucose-Capillary: 133 mg/dL — ABNORMAL HIGH (ref 70–99)
Glucose-Capillary: 134 mg/dL — ABNORMAL HIGH (ref 70–99)
Glucose-Capillary: 135 mg/dL — ABNORMAL HIGH (ref 70–99)
Glucose-Capillary: 140 mg/dL — ABNORMAL HIGH (ref 70–99)
Glucose-Capillary: 144 mg/dL — ABNORMAL HIGH (ref 70–99)
Glucose-Capillary: 149 mg/dL — ABNORMAL HIGH (ref 70–99)
Glucose-Capillary: 149 mg/dL — ABNORMAL HIGH (ref 70–99)
Glucose-Capillary: 168 mg/dL — ABNORMAL HIGH (ref 70–99)
Glucose-Capillary: 180 mg/dL — ABNORMAL HIGH (ref 70–99)
Glucose-Capillary: 72 mg/dL (ref 70–99)

## 2020-03-27 LAB — BASIC METABOLIC PANEL
Anion gap: 7 (ref 5–15)
BUN: 13 mg/dL (ref 8–23)
CO2: 20 mmol/L — ABNORMAL LOW (ref 22–32)
Calcium: 7.2 mg/dL — ABNORMAL LOW (ref 8.9–10.3)
Chloride: 105 mmol/L (ref 98–111)
Creatinine, Ser: 0.9 mg/dL (ref 0.61–1.24)
GFR, Estimated: >60 mL/min (ref >60)
Glucose, Bld: 162 mg/dL — ABNORMAL HIGH (ref 70–99)
Potassium: 3.7 mmol/L (ref 3.5–5.1)
Sodium: 132 mmol/L — ABNORMAL LOW (ref 135–145)

## 2020-03-27 LAB — CBC
HCT: 26 % — ABNORMAL LOW (ref 39.0–52.0)
Hemoglobin: 8.5 g/dL — ABNORMAL LOW (ref 13.0–17.0)
MCH: 30.4 pg (ref 26.0–34.0)
MCHC: 32.7 g/dL (ref 30.0–36.0)
MCV: 92.9 fL (ref 80.0–100.0)
Platelets: 143 10*3/uL — ABNORMAL LOW (ref 150–400)
RBC: 2.8 MIL/uL — ABNORMAL LOW (ref 4.22–5.81)
RDW: 14.5 % (ref 11.5–15.5)
WBC: 19.3 10*3/uL — ABNORMAL HIGH (ref 4.0–10.5)
nRBC: 0 % (ref 0.0–0.2)

## 2020-03-27 LAB — COOXEMETRY PANEL
Carboxyhemoglobin: 1.5 % (ref 0.5–1.5)
Carboxyhemoglobin: 1.4 % (ref 0.5–1.5)
Methemoglobin: 0.9 % (ref 0.0–1.5)
Methemoglobin: 1 % (ref 0.0–1.5)
O2 Saturation: 66.8 %
O2 Saturation: 64.7 %
Total hemoglobin: 8.7 g/dL — ABNORMAL LOW (ref 12.0–16.0)
Total hemoglobin: 9 g/dL — ABNORMAL LOW (ref 12.0–16.0)

## 2020-03-27 LAB — SURGICAL PATHOLOGY

## 2020-03-27 IMAGING — DX DG CHEST 1V PORT
1 series · 1 of 1 positions shown · non-contrast
Comparison: [DATE]

CLINICAL DATA: Status post cardiac surgery

EXAM:
PORTABLE CHEST 1 VIEW

[chest]
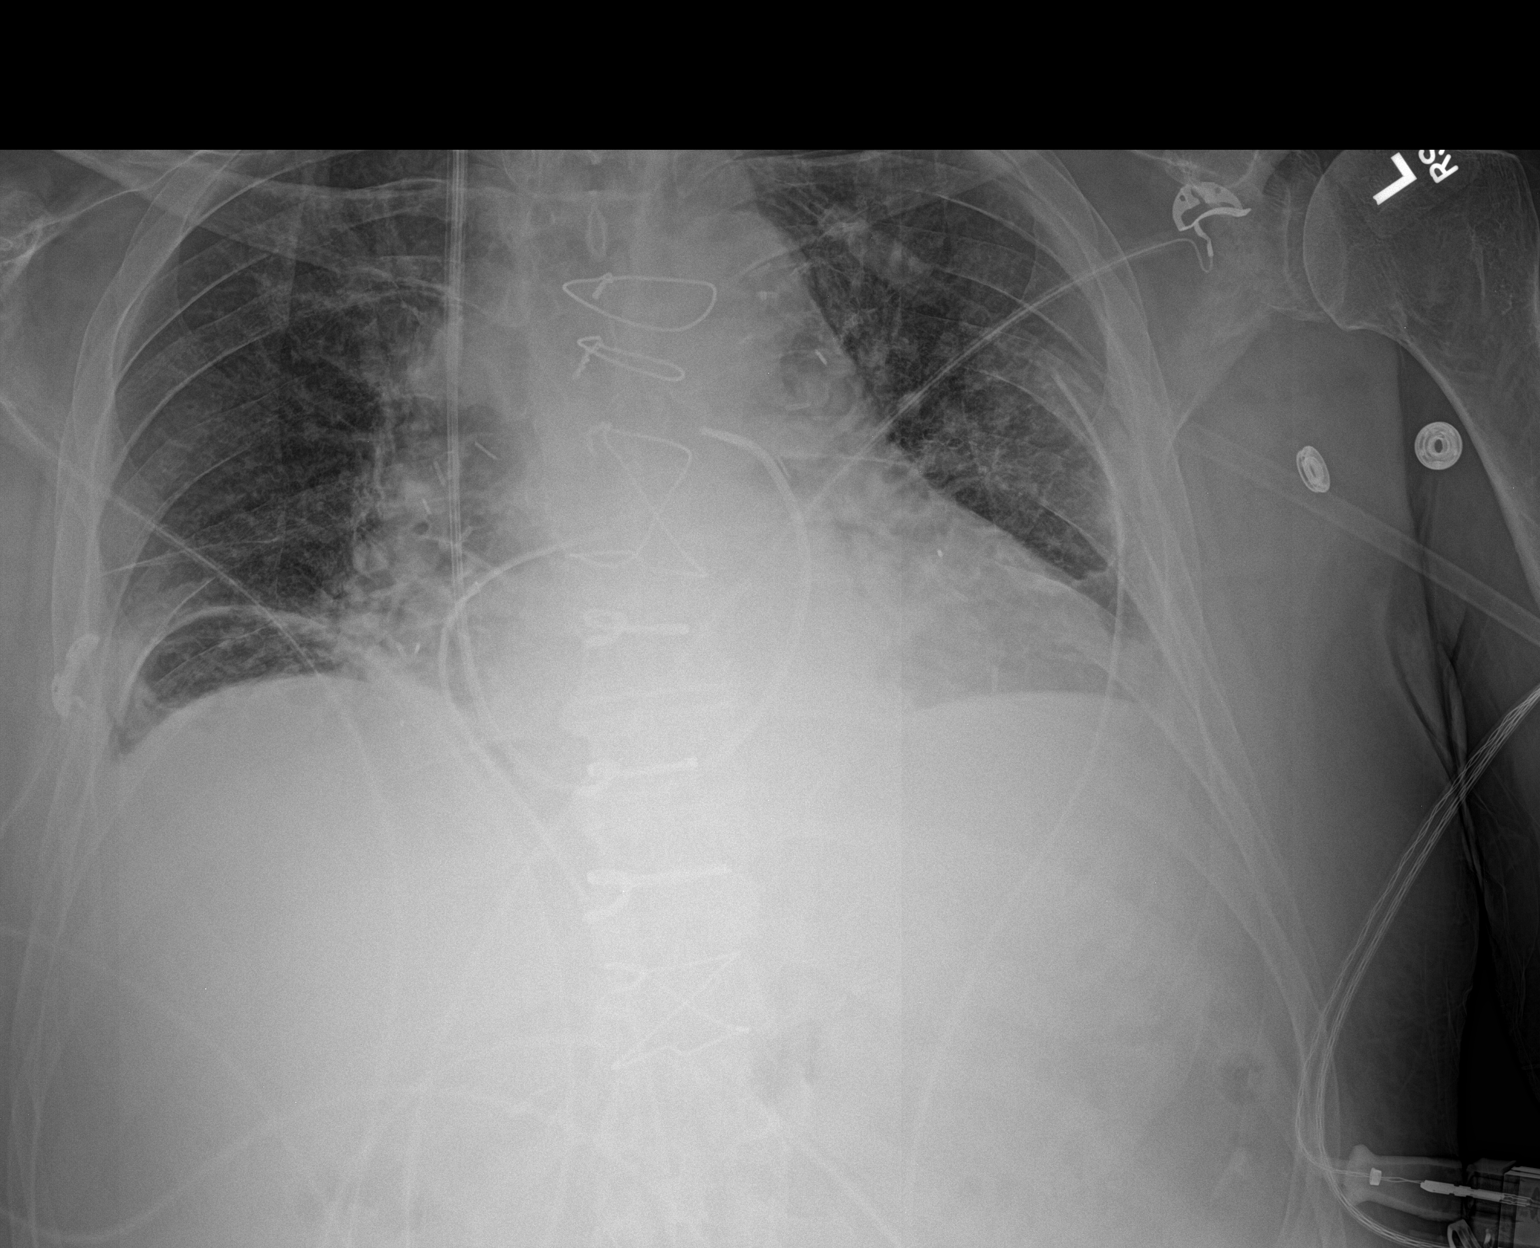

[1 of 1 positions shown; findings below may reference images not displayed]

FINDINGS: Cardiac shadow is enlarged but stable. Postoperative changes are
again seen. Swan-Ganz catheter is noted within the pulmonary outflow
tract. Bilateral thoracostomy tubes and mediastinal drain are again
noted and stable. No pneumothorax is noted. No focal infiltrate is
seen. Aeration has improved from the prior exam
IMPRESSION: Tubes and lines as described above.  No acute abnormality noted.

## 2020-03-27 MED ORDER — LEVALBUTEROL HCL 0.63 MG/3ML IN NEBU
0.6300 mg | INHALATION_SOLUTION | Freq: Three times a day (TID) | RESPIRATORY_TRACT | Status: DC
  Administered 2020-03-27 – 2020-03-28 (×3): 0.63 mg via RESPIRATORY_TRACT
  Filled 2020-03-27 (×3): qty 3

## 2020-03-27 MED ORDER — POTASSIUM CHLORIDE CRYS ER 20 MEQ PO TBCR
20.0000 meq | EXTENDED_RELEASE_TABLET | ORAL | Status: AC
  Administered 2020-03-27 (×3): 20 meq via ORAL
  Filled 2020-03-27: qty 1
  Filled 2020-03-27: qty 2
  Filled 2020-03-27: qty 1

## 2020-03-27 MED ORDER — FUROSEMIDE 10 MG/ML IJ SOLN
40.0000 mg | Freq: Two times a day (BID) | INTRAMUSCULAR | Status: DC
  Administered 2020-03-27 – 2020-03-30 (×8): 40 mg via INTRAVENOUS
  Filled 2020-03-27 (×8): qty 4

## 2020-03-27 MED ORDER — MILRINONE LACTATE IN DEXTROSE 20-5 MG/100ML-% IV SOLN: 0.2500 ug/kg/min | INTRAVENOUS | Status: DC

## 2020-03-27 NOTE — Discharge Summary (Incomplete Revision)
[address]       [place] [address]             [phone]    Physician Discharge Summary  Patient ID: Roy [name] MRN: [id number] DOB/AGE: 1945-10-28 75 y.o.  Admit date: 03/21/2020 Discharge date: 04/01/2020  Admission Diagnoses:  Patient Active Problem List   Diagnosis Date Noted  . Shortness of breath 03/21/2020  . Acute pulmonary edema ([place]) 03/21/2020  . Indigestion 03/21/2020  . Troponin level elevated 03/21/2020  . CHF (congestive heart failure), NYHA class III ([place]) 03/21/2020  . 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 NSTEMI (non-ST elevated myocardial infarction) ([place]) 03/21/2020  . Hyperlipidemia 01/30/2015  . Nonischemic cardiomyopathy ([place]) 01/30/2015  . Essential hypertension 01/30/2015  . Faintness 01/30/2015  . Chest pain 10/24/2014  . Cardiomyopathy ([place]) 10/24/2014  . Allergic rhinitis   . Syncope   . Nephrolithiasis   . Plantar fasciitis   . Hypovitaminosis D     Discharge Diagnoses:  Patient Active Problem List   Diagnosis Date Noted  . 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 Shortness of breath 03/21/2020  . Acute pulmonary edema ([place]) 03/21/2020  . Indigestion 03/21/2020  . Troponin level elevated 03/21/2020  . CHF (congestive heart failure), NYHA class III ([place]) 03/21/2020  . 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 NSTEMI (non-ST elevated myocardial infarction) ([place]) 03/21/2020  . Hyperlipidemia 01/30/2015  . Nonischemic cardiomyopathy ([place]) 01/30/2015  . Essential hypertension 01/30/2015  . Faintness 01/30/2015  . Chest pain 10/24/2014  . Cardiomyopathy ([place]) 10/24/2014  . Allergic rhinitis   . Syncope   . Nephrolithiasis   . Plantar fasciitis   . 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  Roy [name] noticed acute onset dyspnea after he laid down in his bed on 03/21/2020.  He has some associated epigastric discomfort that he thought was indigestion.  He also had a cough with some pink frothy sputum.  EMS was summoned.  His oxygen saturation was 82% on arrival of the EMS team.  He was placed on nonrebreather and after arrival to the emergency room, his EKG demonstrated sinus tachycardia with PVCs and mild ST depressions in V2 along with nonspecific ST-T wave changes.  His troponin is elevated at 1400.  The cardiology service was consulted and he was ultimately admitted to cardiology with a diagnosis of acute systolic heart failure with non-ST elevation myocardial infarction on 03/21/2020.  CTA was obtained and ruled out pulmonary embolus.  There was, however, demonstration of significant three-vessel coronary calcifications.  Was started on a heparin drip and given aspirin.  He was diuresed resulting in a net output of 2.7 L within 24 hours of admission.  His symptoms improved significantly.  He remained stable.  Left heart catheterization was recommended and carried out earlier today demonstrating severe three-vessel coronary artery disease along with an 80% left main coronary artery stenosis.  Ejection fraction was estimated 25 to  35%.  Echocardiography has been done this admission that shows global left ventricular hypokinesis.  Ejection fraction is estimated 25 to 30% by echo.  There were no significant valve lesions.  It was felt coronary bypass grafting would be indicated and TCTS consult was obtained.    Hospital Course:  The patient  remained chest pain free.  He was evaluated by Dr. Orvan [name] who felt the patient would benefit from coronary bypass grafting procedure.  The risks and benefits of the procedure were explained to the patient and he was agreeable to proceed.  He was taken to the operating room on 03/25/2020.  He underwent CABG x 4 utilizing LIMA to LAD, Sequential SVG to OM 1 and OM 2, and RIMA to PDA.  He also underwent open harvest of left radial artery.  He tolerated the procedure without difficulty and was taken to the SICU in stable condition.  The patient was extubated the evening of surgery.  During his stay in the SICU the patient was weaned off Epi, Levophed, and Vasopressin as hemodynamics allowed.  He was transitioned from IV NTG to oral regimen of Isordil for his radial artery harvest.  His chest tubes and arterial lines were removed without difficulty.  He was weaned off Milrinone on 03/29/2020.  He was started on IV lasix to facilitate diuresis.  On 03/29/2020, he had a brief episode of atrial fibrillation that converted back to sinus rhythm prior to IV amiodarone being initiated.  He was then started on an oral amiodarone load.  Chest tubes were removed on 03/29/2020.  DVT prophylaxis was begun with subcutaneous enoxaparin.  Ambulation was encouraged.  He was taken off Milrinone drip on 03/29/2020.  Co-ox remained stable.  The patient was medically stable for transfer to  the progressive care unit on 03/30/2020.  He remained in NSR.  His pacing wires were removed without difficulty.  He was severely hypokalemic and was supplemented accordingly.  Most recent potassium level is 4.6.  The patient developed diarrhea.  This was felt to be due to use of stool softeners.  They were discontinued and he was treated with imodium.  His foley catheter was removed without difficulty.  He remained on Flomax and voided without difficulty post catheter removal.  He complained of scrotal swelling which was present prior to surgery due to his recent  left sided orchiectomy and new onset B Cell Lymphoma diagnosis.  The patient is ambulating in the hallway.  His surgical incisions are healing without evidence of infection.  He denies any paraesthesias from his radial artery harvest on his left arm.  He is felt to be medically stable for discharge home today.   Significant Diagnostic Studies: angiography:    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  Prox RCA-2 lesion is 50% stenosed.  Dist RCA lesion is 50% stenosed.  Ost Cx lesion is 90% stenosed.  Mid LM lesion is 80% stenosed.  Ost LAD lesion is 75% stenosed.  Mid Cx to Dist Cx lesion is 75% stenosed.  2nd Mrg lesion is 75% stenosed.  2nd Diag lesion is 75% stenosed.  Mid LAD-1 lesion is 75% stenosed.  Mid LAD-2 lesion is 75% stenosed.  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  The left ventricular ejection fraction is 25-35% by visual estimate.  LV end diastolic pressure is normal.  There is no aortic valve stenosis.  Ao sat 94%, PA sat 66%, PA pressure 27/6, mean PA 15 mm Hg; mean PCWP 8 mm Hg, CO 7.5 L/min;  CI 3.3. Normal right heart pressures.   Treatments: surgery:    Coronary Artery Bypass Grafting x 4             Left Internal Mammary Artery to Distal Left Anterior Descending Coronary Artery; pedicled right internal mammary artery graft to Posterior Descending Coronary Artery; left radial artery graft to first and second obtuse Marginal Branches of Left Circumflex Coronary Artery has a sequenced graft Open left radial artery harvesting Bilateral internal mammary artery harvesting Completion graft surveillance with indocyanine green fluorescence imaging  Surgeon:        B.  Murvin Natal, MD  Assistant:       Leretha Pol, PA-C   Discharge Exam: Blood pressure 120/77, pulse 100, temperature 98.7 F (37.1 C), temperature source Oral, resp. rate 19, height 6\' 1"  (1.854 m), weight 99.4 kg, SpO2 98 %.   Gen: no apparent distress Heart:  RRR Lungs; CTA Abd: soft non-tender, non-distended Ext: mild edema Incisions: C/D/I, staples in place left radial  Discharge Medications:  The patient has been discharged on:   1.Beta Blocker:  Yes [ X  ]                              No   [   ]                              If No, reason:  2.Ace Inhibitor/ARB: Yes [ X  ]                                     No  [    ]                                     If No, reason:  3.Statin:   Yes [ x  ]                  No  [   ]                  If No, reason:  4.Ecasa:  Yes  [ x  ]                  No   [   ]                  If No, reason:   Allergies as of 04/01/2020   No Known Allergies     Medication List    STOP taking these medications   amLODipine 10 MG tablet Commonly known as: NORVASC   aspirin 81 MG tablet Replaced by: aspirin 325 MG EC tablet   HYDROcodone-acetaminophen 5-325 MG tablet Commonly known as: Norco     TAKE these medications   acetaminophen 325 MG tablet Commonly known as: TYLENOL Take 2 tablets (650 mg total) by mouth every 4 (four) hours as needed for headache or mild pain.   amiodarone 200 MG tablet Commonly known as: PACERONE Take 2 tablets (400 mg total) by mouth 2 (two) times daily. Until 3/13, then decrease to 200 mg daily   aspirin 325 MG EC tablet Take 1 tablet (325 mg total) by mouth daily. Replaces: aspirin 81 MG tablet  carvedilol 6.25 MG tablet Commonly known as: COREG Take 1 tablet (6.25 mg total) by mouth 2 (two) times daily.   cholecalciferol 1000 units tablet Commonly known as: VITAMIN D Take 1,000 Units by mouth See admin instructions. Mon-friday   clopidogrel 75 MG tablet Commonly known as: Plavix Take 1 tablet (75 mg total) by mouth daily.   furosemide 40 MG tablet Commonly known as: Lasix Take 1 tablet (40 mg total) by mouth daily. Start taking on: April 02, 2020   glucosamine-chondroitin 500-400 MG tablet Take 1 tablet by mouth. 5 times weekly   isosorbide  dinitrate 10 MG tablet Commonly known as: ISORDIL Take 1 tablet (10 mg total) by mouth 3 (three) times daily.   lisinopril 5 MG tablet Commonly known as: ZESTRIL Take 1 tablet (5 mg total) by mouth daily. What changed:   medication strength  how much to take   loperamide 2 MG capsule Commonly known as: IMODIUM Take 1 capsule (2 mg total) by mouth as needed for diarrhea or loose stools.   metFORMIN 1000 MG tablet Commonly known as: GLUCOPHAGE Take 1,000 mg by mouth 2 (two) times daily with a meal.   multivitamin with minerals Tabs tablet Take 1 tablet by mouth. 5 times weekly   omeprazole 20 MG capsule Commonly known as: PRILOSEC Take 20 mg by mouth daily.   pioglitazone 30 MG tablet Commonly known as: ACTOS Take 30 mg by mouth daily.   Potassium Chloride ER 20 MEQ Tbcr Take 20 mEq by mouth daily. Start taking on: April 02, 2020   pravastatin 80 MG tablet Commonly known as: PRAVACHOL Take 1 tablet (80 mg total) by mouth daily. What changed: when to take this   PREVAGEN PO Take 1 tablet by mouth at bedtime.   tamsulosin 0.4 MG Caps capsule Commonly known as: FLOMAX Take 0.4 mg by mouth at bedtime.   traMADol 50 MG tablet Commonly known as: ULTRAM Take 1 tablet (50 mg total) by mouth every 4 (four) hours as needed for moderate pain.   UNABLE TO FIND Take 1 tablet by mouth 2 (two) times a day. Study cholesterol drug if drug name needed please call April McCaskill at [phone]   vitamin B-12 1000 MCG tablet Commonly known as: CYANOCOBALAMIN Take 1,000 mcg by mouth. 5 times a week            Durable Medical Equipment  (From admission, onward)         Start     Ordered   03/31/20 0731  For home use only DME Walker rolling  Once       Question Answer Comment  Walker: With 5 Inch Wheels   Patient needs a walker to treat with the following condition S/P CABG x 4      03/31/20 0730   03/31/20 0731  For home use only DME Shower stool  Once         03/31/20 0730          Follow-up Information    [name], MD Follow up on 04/13/2020.   Specialty: Cardiothoracic Surgery Why: Appointment is at 9:30 Contact information: [place] [address] [phone]        Lendon Colonel, NP Follow up on 04/10/2020.   Specialties: Nurse Practitioner, Radiology, Cardiology Why: Appointment is at 2:15 Contact information: [place] [address] [phone]  SignedEllwood Handler, PA-C 04/01/2020, 10:32 AM

## 2020-03-27 NOTE — Progress Notes (Signed)
      [address]       [place] [address]             [phone]      POD 2 CABG  Up in chair  BP 124/75   Pulse (!) 101   Temp 98.6 F (37 C) (Oral)   Resp (!) 29   Ht 6\' 1"  (1.854 m)   Wt 104.1 kg   SpO2 98%   BMI 30.28 kg/m   1L [place] 99% sat  Intake/Output Summary (Last 24 hours) at 03/27/2020 1735 Last data filed at 03/27/2020 1500 Gross per 24 hour  Intake 1944.59 ml  Output 1890 ml  Net 54.59 ml   Co-ox 65% on milrinone 0.125  Ambulated around unit  Vincent C. Roxan Hockey, MD [facility] [phone]

## 2020-03-27 NOTE — Discharge Summary (Addendum)
[address]       [place] [address]             [phone]    Physician Discharge Summary  Patient ID: Roy [name] MRN: [id number] DOB/AGE: 77-Nov-1947 77 y.o.  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 03/21/2020   NSTEMI (non-ST elevated myocardial infarction) ([place]) 03/21/2020   Hyperlipidemia 01/30/2015   Nonischemic cardiomyopathy ([place]) 01/30/2015   Essential hypertension 01/30/2015   Faintness 01/30/2015   Chest pain 10/24/2014   Cardiomyopathy ([place]) 10/24/2014   Allergic rhinitis    Syncope    Nephrolithiasis    Plantar fasciitis    Hypovitaminosis D     Discharge Diagnoses:  Patient Active Problem List   Diagnosis Date Noted   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   Shortness of breath 03/21/2020   Acute pulmonary edema ([place]) 03/21/2020   Indigestion 03/21/2020   Troponin level elevated 03/21/2020   CHF (congestive heart failure), NYHA class III ([place]) 03/21/2020   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  [name] noticed acute onset dyspnea after he laid down in his bed on 03/21/2020.  He has some associated epigastric discomfort that he thought was indigestion.  He also had a cough with some pink frothy sputum.  EMS was summoned.  His oxygen saturation was 82% on arrival of the EMS team.  He was placed on nonrebreather and after arrival to the emergency room, his EKG demonstrated sinus tachycardia with PVCs and mild ST depressions in V2 along with nonspecific ST-T wave changes.  His troponin is elevated at 1400.  The cardiology service was consulted and he was ultimately admitted to cardiology with a diagnosis of acute systolic heart failure with non-ST elevation myocardial infarction on 03/21/2020.  CTA was obtained and ruled out pulmonary embolus.  There was, however, demonstration of significant three-vessel coronary calcifications.  Was started on a heparin drip and given aspirin.  He was diuresed resulting in a net output of 2.7 L within 24 hours of admission.  His symptoms improved significantly.  He remained stable.  Left heart catheterization was recommended and carried out earlier today demonstrating severe three-vessel coronary artery disease along with an 80% left main coronary artery stenosis.  Ejection fraction was estimated 25 to  35%.  Echocardiography has been done this admission that shows global left ventricular hypokinesis.  Ejection fraction is estimated 25 to 30% by echo.  There were no significant valve lesions.  It was felt coronary bypass grafting would be indicated and TCTS consult was obtained.    Hospital Course:  The patient  remained chest pain free.  He was evaluated by Dr. [name] who felt the patient would benefit from coronary bypass grafting procedure.  The risks and benefits of the procedure were explained to the patient and he was agreeable to proceed.  He was taken to the operating room on 03/25/2020.  He underwent CABG x 4 utilizing LIMA to LAD, Sequential SVG to OM 1 and OM 2, and RIMA to PDA.  He also underwent open harvest of left radial artery.  He tolerated the procedure without difficulty and was taken to the SICU in stable condition.  The patient was extubated the evening of surgery.  During his stay in the SICU the patient was weaned off Epi, Levophed, and Vasopressin as hemodynamics allowed.  He was transitioned from IV NTG to oral regimen of Isordil for his radial artery harvest.  His chest tubes and arterial lines were removed without difficulty.  He was weaned off Milrinone on 03/29/2020.  He was started on IV lasix to facilitate diuresis.  On 03/29/2020, he had a brief episode of atrial fibrillation that converted back to sinus rhythm prior to IV amiodarone being initiated.  He was then started on an oral amiodarone load.  Chest tubes were removed on 03/29/2020.  DVT prophylaxis was begun with subcutaneous enoxaparin.  Ambulation was encouraged.  He was taken off Milrinone drip on 03/29/2020.  Co-ox remained stable.  The patient was medically stable for transfer to  the progressive care unit on 03/30/2020.  He remained in NSR.  His pacing wires were removed without difficulty.  He was severely hypokalemic and was supplemented accordingly.  Most recent potassium level is 4.6.  The patient developed diarrhea.  This was felt to be due to use of stool softeners.  They were discontinued and he was treated with imodium.  His foley catheter was removed without difficulty.  He remained on Flomax and voided without difficulty post catheter removal.  He complained of scrotal swelling which was present prior to surgery due to his recent  left sided orchiectomy and new onset B Cell Lymphoma diagnosis.  The patient is ambulating in the hallway.  His surgical incisions are healing without evidence of infection.  He denies any paraesthesias from his radial artery harvest on his left arm.  He is felt to be medically stable for discharge home today.   Significant Diagnostic Studies: angiography:    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  Prox RCA-2 lesion is 50% stenosed.  Dist RCA lesion is 50% stenosed.  Ost Cx lesion is 90% stenosed.  Mid LM lesion is 80% stenosed.  Ost LAD lesion is 75% stenosed.  Mid Cx to Dist Cx lesion is 75% stenosed.  2nd Mrg lesion is 75% stenosed.  2nd Diag lesion is 75% stenosed.  Mid LAD-1 lesion is 75% stenosed.  Mid LAD-2 lesion is 75% stenosed.  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  The left ventricular ejection fraction is 25-35% by visual estimate.  LV end diastolic pressure is normal.  There is no aortic valve stenosis.  Ao sat 94%, PA sat 66%, PA pressure 27/6, mean PA 15 mm Hg; mean PCWP 8 mm Hg, CO 7.5 L/min;  CI 3.3. Normal right heart pressures.   Treatments: surgery:    Coronary Artery Bypass Grafting x 4             Left Internal Mammary Artery to Distal Left Anterior Descending Coronary Artery; pedicled right internal mammary artery graft to Posterior Descending Coronary Artery; left radial artery graft to first and second obtuse Marginal Branches of Left Circumflex Coronary Artery has a sequenced graft Open left radial artery harvesting Bilateral internal mammary artery harvesting Completion graft surveillance with indocyanine green fluorescence imaging  Surgeon:        B.  Murvin Natal, MD  Assistant:       Leretha Pol, PA-C   Discharge Exam: Blood pressure 95/64, pulse 74, temperature 97.7 F (36.5 C), temperature source Oral, resp. rate 18, height 6\' 1"  (1.854 m), weight 99.4 kg, SpO2 97 %.   Gen: no apparent distress Heart: RRR Lungs;  CTA Abd: soft non-tender, non-distended Ext: mild edema Incisions: C/D/I, staples in place left radial  Discharge Medications:  The patient has been discharged on:   1.Beta Blocker:  Yes [ X  ]                              No   [   ]                              If No, reason:  2.Ace Inhibitor/ARB: Yes [ X  ]                                     No  [    ]                                     If No, reason:  3.Statin:   Yes [ x  ]                  No  [   ]                  If No, reason:  4.Ecasa:  Yes  [ x  ]                  No   [   ]                  If No, reason:  Discharge Instructions    Amb Referral to Cardiac Rehabilitation   Complete by: As directed    Diagnosis: CABG   CABG X ___: 4   After initial evaluation and assessments completed: Virtual Based Care may be provided alone or in conjunction with Phase 2 Cardiac Rehab based on patient barriers.: Yes     Allergies as of 04/02/2020   No Known Allergies     Medication List    STOP taking these medications   amLODipine 10 MG tablet Commonly known as: NORVASC   aspirin 81 MG tablet Replaced by: aspirin 325 MG EC tablet   HYDROcodone-acetaminophen 5-325 MG tablet Commonly known as: Norco     TAKE these medications   acetaminophen 325 MG tablet Commonly known as: TYLENOL Take 2 tablets (650 mg total) by mouth every 4 (four) hours  as needed for headache or mild pain.   amiodarone 200 MG tablet Commonly known as: PACERONE Take 2 tablets (400 mg total) by mouth 2 (two) times daily. Until 3/13, then decrease to 200 mg daily   aspirin 325 MG EC tablet Take 1 tablet (325 mg total) by mouth daily. Replaces: aspirin 81 MG tablet   carvedilol 6.25 MG tablet Commonly known as: COREG Take 1 tablet (6.25 mg total) by mouth 2 (two) times daily.   cholecalciferol 1000 units tablet Commonly known as: VITAMIN D Take 1,000 Units by mouth See admin instructions. Mon-friday   clopidogrel 75 MG  tablet Commonly known as: Plavix Take 1 tablet (75 mg total) by mouth daily.   furosemide 40 MG tablet Commonly known as: Lasix Take 1 tablet (40 mg total) by mouth daily.   glucosamine-chondroitin 500-400 MG tablet Take 1 tablet by mouth. 5 times weekly   isosorbide dinitrate 10 MG tablet Commonly known as: ISORDIL Take 1 tablet (10 mg total) by mouth 3 (three) times daily.   lisinopril 5 MG tablet Commonly known as: ZESTRIL Take 1 tablet (5 mg total) by mouth daily. What changed:   medication strength  how much to take   loperamide 2 MG capsule Commonly known as: IMODIUM Take 1 capsule (2 mg total) by mouth as needed for diarrhea or loose stools.   metFORMIN 1000 MG tablet Commonly known as: GLUCOPHAGE Take 1,000 mg by mouth 2 (two) times daily with a meal.   multivitamin with minerals Tabs tablet Take 1 tablet by mouth. 5 times weekly   omeprazole 20 MG capsule Commonly known as: PRILOSEC Take 20 mg by mouth daily.   pioglitazone 30 MG tablet Commonly known as: ACTOS Take 30 mg by mouth daily.   Potassium Chloride ER 20 MEQ Tbcr Take 20 mEq by mouth daily.   pravastatin 80 MG tablet Commonly known as: PRAVACHOL Take 1 tablet (80 mg total) by mouth daily. What changed: when to take this   PREVAGEN PO Take 1 tablet by mouth at bedtime.   tamsulosin 0.4 MG Caps capsule Commonly known as: FLOMAX Take 0.4 mg by mouth at bedtime.   traMADol 50 MG tablet Commonly known as: ULTRAM Take 1 tablet (50 mg total) by mouth every 4 (four) hours as needed for moderate pain.   UNABLE TO FIND Take 1 tablet by mouth 2 (two) times a day. Study cholesterol drug if drug name needed please call April McCaskill at [phone]   vitamin B-12 1000 MCG tablet Commonly known as: CYANOCOBALAMIN Take 1,000 mcg by mouth. 5 times a week       Follow-up Information    [name], MD Follow up on 04/13/2020.   Specialty: Cardiothoracic Surgery Why: Appointment is at  9:30 Contact information: [place] [address] [phone]        Lendon Colonel, NP Follow up on 04/10/2020.   Specialties: Nurse Practitioner, Radiology, Cardiology Why: Appointment is at 2:15 Contact information: [place] [address] [phone]               Signed:  Ellwood Handler, PA-C 04/02/2020, 7:14 AM

## 2020-03-27 NOTE — Progress Notes (Signed)
Cardiology Progress Note  Patient ID: Roy [name] MRN: [id number] DOB: 07/16/1945 Date of Encounter: 03/27/2020  Primary Cardiologist: Ena Dawley, MD  Subjective   Chief Complaint: None  HPI: Remains on milrinone and Levophed.  Still with chest tube in place.  Maintaining sinus rhythm.  Reports no chest pain.  A little short of breath.  Up in chair this morning.  Seems to be progressing.  ROS:  All other ROS reviewed and negative. Pertinent positives noted in the HPI.     Inpatient Medications  Scheduled Meds: . acetaminophen  1,000 mg Oral Q6H   Or  . acetaminophen (TYLENOL) oral liquid 160 mg/5 mL  1,000 mg Per Tube Q6H  . aspirin EC  325 mg Oral Daily   Or  . aspirin  324 mg Per Tube Daily  . atorvastatin  40 mg Oral QHS  . bisacodyl  10 mg Oral Daily   Or  . bisacodyl  10 mg Rectal Daily  . Chlorhexidine Gluconate Cloth  6 each Topical Q0600  . cholecalciferol  1,000 Units Oral QPM  . colchicine  0.3 mg Oral BID  . docusate sodium  200 mg Oral Daily  . ferrous [id number] C-folic acid  1 capsule Oral BID PC  . isosorbide dinitrate  5 mg Oral BID  . mouth rinse  15 mL Mouth Rinse BID  . pantoprazole  40 mg Oral Daily  . potassium chloride  20 mEq Oral Q4H  . sodium chloride flush  10-40 mL Intracatheter Q12H  . sodium chloride flush  3 mL Intravenous Q12H  . sodium chloride flush  3 mL Intravenous Q12H  . sodium chloride flush  3 mL Intravenous Q12H  . tamsulosin  0.4 mg Oral QHS  . vitamin B-12  1,000 mcg Oral Daily   Continuous Infusions: . sodium chloride 20 mL/hr at 03/27/20 0654  . sodium chloride    . sodium chloride    . sodium chloride 10 mL/hr at 03/25/20 1500  . epinephrine Stopped (03/26/20 1240)  . insulin 2.8 Units/hr (03/27/20 0750)  . lactated ringers    . lactated ringers    . lactated ringers 20 mL/hr at 03/27/20 0654  . milrinone 0.25 mcg/kg/min (03/27/20 0654)  . norepinephrine (LEVOPHED) Adult infusion 3 mcg/min (03/27/20  0654)  . vasopressin Stopped (03/26/20 0910)   PRN Meds: sodium chloride, sodium chloride, acetaminophen, benzonatate, dextromethorphan-guaiFENesin, dextrose, lactated ringers, metoprolol tartrate, ondansetron (ZOFRAN) IV, oxyCODONE, sodium chloride flush, sodium chloride flush, sodium chloride flush, traMADol   Vital Signs   Vitals:   03/27/20 0622 03/27/20 0630 03/27/20 0645 03/27/20 0700  BP:  109/66 93/62 (!) 92/57  Pulse: 98 98 98 93  Resp:  (!) 47 (!) 47 (!) 48  Temp:  98.78 F (37.1 C) 98.6 F (37 C) 98.6 F (37 C)  TempSrc:      SpO2:  100% 97% 97%  Weight:      Height:        Intake/Output Summary (Last 24 hours) at 03/27/2020 0803 Last data filed at 03/27/2020 0654 Gross per 24 hour  Intake 3596.39 ml  Output 1850 ml  Net 1746.39 ml   Last 3 Weights 03/27/2020 03/26/2020 03/24/2020  Weight (lbs) 229 lb 8 oz 224 lb 3.3 oz 215 lb 2.7 oz  Weight (kg) 104.101 kg 101.7 kg 97.6 kg      Telemetry  Overnight telemetry shows sinus rhythm in the 90s, which I personally reviewed.   ECG  The most recent ECG shows sinus  rhythm, heart rate 87, first-degree AV block, lateral T wave inversions, which I personally reviewed.   Physical Exam   Vitals:   03/27/20 0622 03/27/20 0630 03/27/20 0645 03/27/20 0700  BP:  109/66 93/62 (!) 92/57  Pulse: 98 98 98 93  Resp:  (!) 47 (!) 47 (!) 48  Temp:  98.78 F (37.1 C) 98.6 F (37 C) 98.6 F (37 C)  TempSrc:      SpO2:  100% 97% 97%  Weight:      Height:         Intake/Output Summary (Last 24 hours) at 03/27/2020 0803 Last data filed at 03/27/2020 0654 Gross per 24 hour  Intake 3596.39 ml  Output 1850 ml  Net 1746.39 ml    Last 3 Weights 03/27/2020 03/26/2020 03/24/2020  Weight (lbs) 229 lb 8 oz 224 lb 3.3 oz 215 lb 2.7 oz  Weight (kg) 104.101 kg 101.7 kg 97.6 kg    Body mass index is 30.28 kg/m.   General: Well nourished, well developed, in no acute distress Head: Atraumatic, normal size  Eyes: PEERLA, EOMI  Neck: Supple, no  JVD Endocrine: No thryomegaly Cardiac: Normal S1, S2; RRR; + pericardial rub Lungs: Diminished breath sounds bilaterally Abd: Soft, nontender, no hepatomegaly  Ext: No edema, pulses 2+ Musculoskeletal: No deformities, BUE and BLE strength normal and equal Skin: Warm and dry, no rashes, midline surgical scar clean and dry Neuro: Alert and oriented to person, place, time, and situation, CNII-XII grossly intact, no focal deficits  Psych: Normal mood and affect   Labs  High Sensitivity Troponin:   Recent Labs  Lab 03/21/20 0037 03/21/20 0326 03/21/20 0711 03/21/20 1708 03/21/20 1840  TROPONINIHS 551* 773* 1,257* 1,389* 1,415*     Cardiac EnzymesNo results for input(s): TROPONINI in the last 168 hours. No results for input(s): TROPIPOC in the last 168 hours.  Chemistry Recent Labs  Lab 03/21/20 0037 03/22/20 0236 03/26/20 0410 03/26/20 1656 03/27/20 0459  NA 139   < > 137 133* 132*  K 4.1   < > 4.6 3.8 3.7  CL 108   < > 110 107 105  CO2 22   < > 16* 17* 20*  GLUCOSE 201*   < > 134* 140* 162*  BUN 17   < > 13 15 13   CREATININE 0.95   < > 1.03 0.92 0.90  CALCIUM 9.4   < > 7.7* 7.1* 7.2*  PROT 6.4*  --   --   --   --   ALBUMIN 3.4*  --   --   --   --   AST 21  --   --   --   --   ALT 12  --   --   --   --   ALKPHOS 59  --   --   --   --   BILITOT 0.8  --   --   --   --   GFRNONAA >60   < > >60 >60 >60  ANIONGAP 9   < > 11 9 7    < > = values in this interval not displayed.    Hematology Recent Labs  Lab 03/26/20 0410 03/26/20 1656 03/27/20 0459  WBC 22.5* 21.1* 19.3*  RBC 2.75* 2.98* 2.80*  HGB 8.6* 9.3* 8.5*  HCT 25.1* 26.4* 26.0*  MCV 91.3 88.6 92.9  MCH 31.3 31.2 30.4  MCHC 34.3 35.2 32.7  RDW 13.8 14.5 14.5  PLT 178 171 143*   BNP Recent  Labs  Lab 03/21/20 0037  BNP 648.5*    DDimer  Recent Labs  Lab 03/21/20 0037  DDIMER 0.92*     Radiology  DG Chest Port 1 View  Result Date: 03/27/2020 CLINICAL DATA:  Status post cardiac surgery EXAM:  PORTABLE CHEST 1 VIEW COMPARISON:  03/26/2020 FINDINGS: Cardiac shadow is enlarged but stable. Postoperative changes are again seen. Swan-Ganz catheter is noted within the pulmonary outflow tract. Bilateral thoracostomy tubes and mediastinal drain are again noted and stable. No pneumothorax is noted. No focal infiltrate is seen. Aeration has improved from the prior exam IMPRESSION: Tubes and lines as described above.  No acute abnormality noted. Electronically Signed   By: Inez Catalina M.D.   On: 03/27/2020 06:33   DG Chest Port 1 View  Result Date: 03/26/2020 CLINICAL DATA:  Chest tube EXAM: PORTABLE CHEST 1 VIEW COMPARISON:  03/25/2020 FINDINGS: Swan-Ganz catheter slightly advanced into the central right pulmonary artery. Left chest tube remains in place. No pneumothorax. Interval extubation and removal of NG tube. Low lung volumes with vascular congestion. Bibasilar atelectasis, increasing since prior study. IMPRESSION: Interval extubation. Worsening aeration of the lungs with increasing vascular congestion and bibasilar atelectasis. No pneumothorax. Electronically Signed   By: Rolm Baptise M.D.   On: 03/26/2020 08:37   DG Chest Port 1 View  Result Date: 03/25/2020 CLINICAL DATA:  Status post CABG. EXAM: PORTABLE CHEST 1 VIEW COMPARISON:  Chest x-rays dated 03/22/2020 and 03/21/2020. FINDINGS: Heart size and mediastinal contours are stable. Endotracheal tube appears adequately positioned with tip at the level the clavicles. Swan-Ganz catheter in place with tip just to the LEFT of midline. Median sternotomy wires now in place. Central pulmonary vascular congestion. No pleural effusion or pneumothorax is seen. IMPRESSION: 1. Central pulmonary vascular congestion suggesting mild volume overload/CHF. 2. Swan-Ganz catheter in place with tip just to the LEFT of midline. Consider advancing for optimal radiographic positioning. Electronically Signed   By: Franki Cabot M.D.   On: 03/25/2020 15:11    Cardiac  Studies  TTE 03/22/2020 1. Global hypokinesis with akinesis of the inferior and inferolateral  walls; overall severe LV dysfunction.  2. Left ventricular ejection fraction, by estimation, is 25 to 30%. The  left ventricle has severely decreased function. The left ventricle  demonstrates regional wall motion abnormalities (see scoring  diagram/findings for description). The left  ventricular internal cavity size was mildly dilated. Left ventricular  diastolic parameters are consistent with Grade I diastolic dysfunction  (impaired relaxation).  3. Right ventricular systolic function is normal. The right ventricular  size is normal. Tricuspid regurgitation signal is inadequate for assessing  PA pressure.  4. The mitral valve is normal in structure. Mild mitral valve  regurgitation. No evidence of mitral stenosis.  5. The aortic valve is tricuspid. Aortic valve regurgitation is not  visualized. Mild aortic valve sclerosis is present, with no evidence of  aortic valve stenosis.  6. The inferior vena cava is normal in size with greater than 50%  respiratory variability, suggesting right atrial pressure of 3 mmHg.   Patient Profile  75 y.o. male with hypertension, diabetes, GERD, nonischemic cardiomyopathy with recovery of EF who was admitted on 03/21/2020 with acute decompensated congestive heart failure and non-STEMI.  Found to have severe three-vessel CAD.  He underwent CABG x4 on 03/25/2020.  Now recovering in the ICU.  Assessment & Plan   1.  Non-STEMI/systolic heart failure, EF 25%/severe three-vessel CAD including left main disease/status post CABG -Admitted with congestive heart failure.  Found to have severely reduced EF.  Found to have severe three-vessel CAD including left main disease. -Status post CABG on 03/25/2020.  He had LIMA to LAD, radial artery graft to OM 1 and 2 (sequential graft), RIMA to PDA -Recovering in the ICU.  Still on inotropes and pressors. -Maintaining sinus  rhythm. -Once he is out of the ICU he will need to get back on guideline directed medical therapy.  Also would consider Plavix as he presented with non-STEMI.  This will be started once he is out of ICU. -Cardiology will follow up on Monday.  We will be available as needed over the weekend.  For questions or updates, please contact [facility] Please consult [email or web address] for contact info under   Time Spent with Patient: I have spent a total of 25 minutes with patient reviewing hospital notes, telemetry, EKGs, labs and examining the patient as well as establishing an assessment and plan that was discussed with the patient.  > 50% of time was spent in direct patient care.    [place] T. [name], MD, [facility]  [address] [place], [place] [address] [phone]  8:09 AM

## 2020-03-27 NOTE — Progress Notes (Signed)
2 Days Post-Op Procedure(s) (LRB): CORONARY ARTERY BYPASS GRAFTING (CABG), ON PUMP, TIMES FOUR, USING BILATERAL INTERNAL MAMMARY ARTERIES AND LEFT RADIAL ARTERY (N/A) LEFT RADIAL ARTERY HARVEST (Left) TRANSESOPHAGEAL ECHOCARDIOGRAM (TEE) (N/A) INDOCYANINE GREEN FLUORESCENCE IMAGING (ICG) (N/A) Subjective: Feeling better  Objective: Vital signs in last 24 hours: Temp:  [97.7 F (36.5 C)-98.78 F (37.1 C)] 98.06 F (36.7 C) (03/04 0800) Pulse Rate:  [87-110] 98 (03/04 0800) Cardiac Rhythm: Normal sinus rhythm (03/04 0340) Resp:  [20-60] 44 (03/04 0800) BP: (91-123)/(50-89) 100/66 (03/04 0800) SpO2:  [95 %-100 %] 97 % (03/04 0800) Arterial Line BP: (58-243)/(38-149) 124/46 (03/04 0800) Weight:  [104.1 kg] 104.1 kg (03/04 0142)  Hemodynamic parameters for last 24 hours: PAP: (18-46)/(5-24) 24/7 CO:  [5.6 L/min-7.9 L/min] 6.1 L/min CI:  [2.5 L/min/m2-3.5 L/min/m2] 2.8 L/min/m2  Intake/Output from previous day: 03/03 0701 - 03/04 0700 In: 3772.3 [P.O.:720; I.V.:2467.7; Blood:384.8; IV Piggyback:199.9] Out: 1905 [Urine:845; Chest Tube:1060] Intake/Output this shift: Total I/O In: -  Out: 15 [Urine:15]  General appearance: alert and cooperative Neurologic: intact Heart: regular rate and rhythm, S1, S2 normal, no murmur, click, rub or gallop Lungs: clear to auscultation bilaterally Abdomen: soft, non-tender; bowel sounds normal; no masses,  no organomegaly Extremities: edema mild Wound: dressed, dry  Lab Results: Recent Labs    03/26/20 1656 03/27/20 0459  WBC 21.1* 19.3*  HGB 9.3* 8.5*  HCT 26.4* 26.0*  PLT 171 143*   BMET:  Recent Labs    03/26/20 1656 03/27/20 0459  NA 133* 132*  K 3.8 3.7  CL 107 105  CO2 17* 20*  GLUCOSE 140* 162*  BUN 15 13  CREATININE 0.92 0.90  CALCIUM 7.1* 7.2*    PT/INR:  Recent Labs    03/25/20 1500  LABPROT 16.6*  INR 1.4*   ABG    Component Value Date/Time   PHART 7.356 03/25/2020 2042   HCO3 17.9 (L) 03/25/2020 2042    TCO2 19 (L) 03/25/2020 2042   ACIDBASEDEF 7.0 (H) 03/25/2020 2042   O2SAT 66.8 03/27/2020 0459   CBG (last 3)  Recent Labs    03/27/20 0341 03/27/20 0527 03/27/20 0741  GLUCAP 135* 123* 140*    Assessment/Plan: S/P Procedure(s) (LRB): CORONARY ARTERY BYPASS GRAFTING (CABG), ON PUMP, TIMES FOUR, USING BILATERAL INTERNAL MAMMARY ARTERIES AND LEFT RADIAL ARTERY (N/A) LEFT RADIAL ARTERY HARVEST (Left) TRANSESOPHAGEAL ECHOCARDIOGRAM (TEE) (N/A) INDOCYANINE GREEN FLUORESCENCE IMAGING (ICG) (N/A) Mobilize Diuresis lower milrinone dose to 0.125   LOS: 6 days    Roy [name] 03/27/2020

## 2020-03-28 ENCOUNTER — Inpatient Hospital Stay (HOSPITAL_COMMUNITY): Payer: Medicare HMO

## 2020-03-28 LAB — TYPE AND SCREEN
ABO/RH(D): A NEG
Antibody Screen: NEGATIVE
Unit division: 0
Unit division: 0

## 2020-03-28 LAB — COOXEMETRY PANEL
Carboxyhemoglobin: 1.3 % (ref 0.5–1.5)
Methemoglobin: 0.9 % (ref 0.0–1.5)
O2 Saturation: 61.4 %
Total hemoglobin: 8.9 g/dL — ABNORMAL LOW (ref 12.0–16.0)

## 2020-03-28 LAB — BASIC METABOLIC PANEL
Anion gap: 6 (ref 5–15)
BUN: 14 mg/dL (ref 8–23)
CO2: 22 mmol/L (ref 22–32)
Calcium: 7.5 mg/dL — ABNORMAL LOW (ref 8.9–10.3)
Chloride: 104 mmol/L (ref 98–111)
Creatinine, Ser: 0.92 mg/dL (ref 0.61–1.24)
GFR, Estimated: >60 mL/min (ref >60)
Glucose, Bld: 111 mg/dL — ABNORMAL HIGH (ref 70–99)
Potassium: 3.9 mmol/L (ref 3.5–5.1)
Sodium: 132 mmol/L — ABNORMAL LOW (ref 135–145)

## 2020-03-28 LAB — CBC
HCT: 27.4 % — ABNORMAL LOW (ref 39.0–52.0)
Hemoglobin: 9.1 g/dL — ABNORMAL LOW (ref 13.0–17.0)
MCH: 30.5 pg (ref 26.0–34.0)
MCHC: 33.2 g/dL (ref 30.0–36.0)
MCV: 91.9 fL (ref 80.0–100.0)
Platelets: 168 10*3/uL (ref 150–400)
RBC: 2.98 MIL/uL — ABNORMAL LOW (ref 4.22–5.81)
RDW: 14.4 % (ref 11.5–15.5)
WBC: 15.6 10*3/uL — ABNORMAL HIGH (ref 4.0–10.5)
nRBC: 0 % (ref 0.0–0.2)

## 2020-03-28 LAB — GLUCOSE, CAPILLARY
Glucose-Capillary: 102 mg/dL — ABNORMAL HIGH (ref 70–99)
Glucose-Capillary: 107 mg/dL — ABNORMAL HIGH (ref 70–99)
Glucose-Capillary: 108 mg/dL — ABNORMAL HIGH (ref 70–99)
Glucose-Capillary: 111 mg/dL — ABNORMAL HIGH (ref 70–99)
Glucose-Capillary: 111 mg/dL — ABNORMAL HIGH (ref 70–99)
Glucose-Capillary: 113 mg/dL — ABNORMAL HIGH (ref 70–99)
Glucose-Capillary: 119 mg/dL — ABNORMAL HIGH (ref 70–99)
Glucose-Capillary: 138 mg/dL — ABNORMAL HIGH (ref 70–99)
Glucose-Capillary: 157 mg/dL — ABNORMAL HIGH (ref 70–99)
Glucose-Capillary: 157 mg/dL — ABNORMAL HIGH (ref 70–99)
Glucose-Capillary: 171 mg/dL — ABNORMAL HIGH (ref 70–99)

## 2020-03-28 LAB — BPAM RBC
Blood Product Expiration Date: 202203282359
Blood Product Expiration Date: 202203282359
ISSUE DATE / TIME: 202203021259
ISSUE DATE / TIME: 202203030928
Unit Type and Rh: 600
Unit Type and Rh: 600

## 2020-03-28 IMAGING — DX DG CHEST 1V PORT
1 series · 1 of 1 positions shown · non-contrast
Comparison: [DATE]

CLINICAL DATA: Three days postoperative film.

EXAM:
PORTABLE CHEST 1 VIEW

[chest]
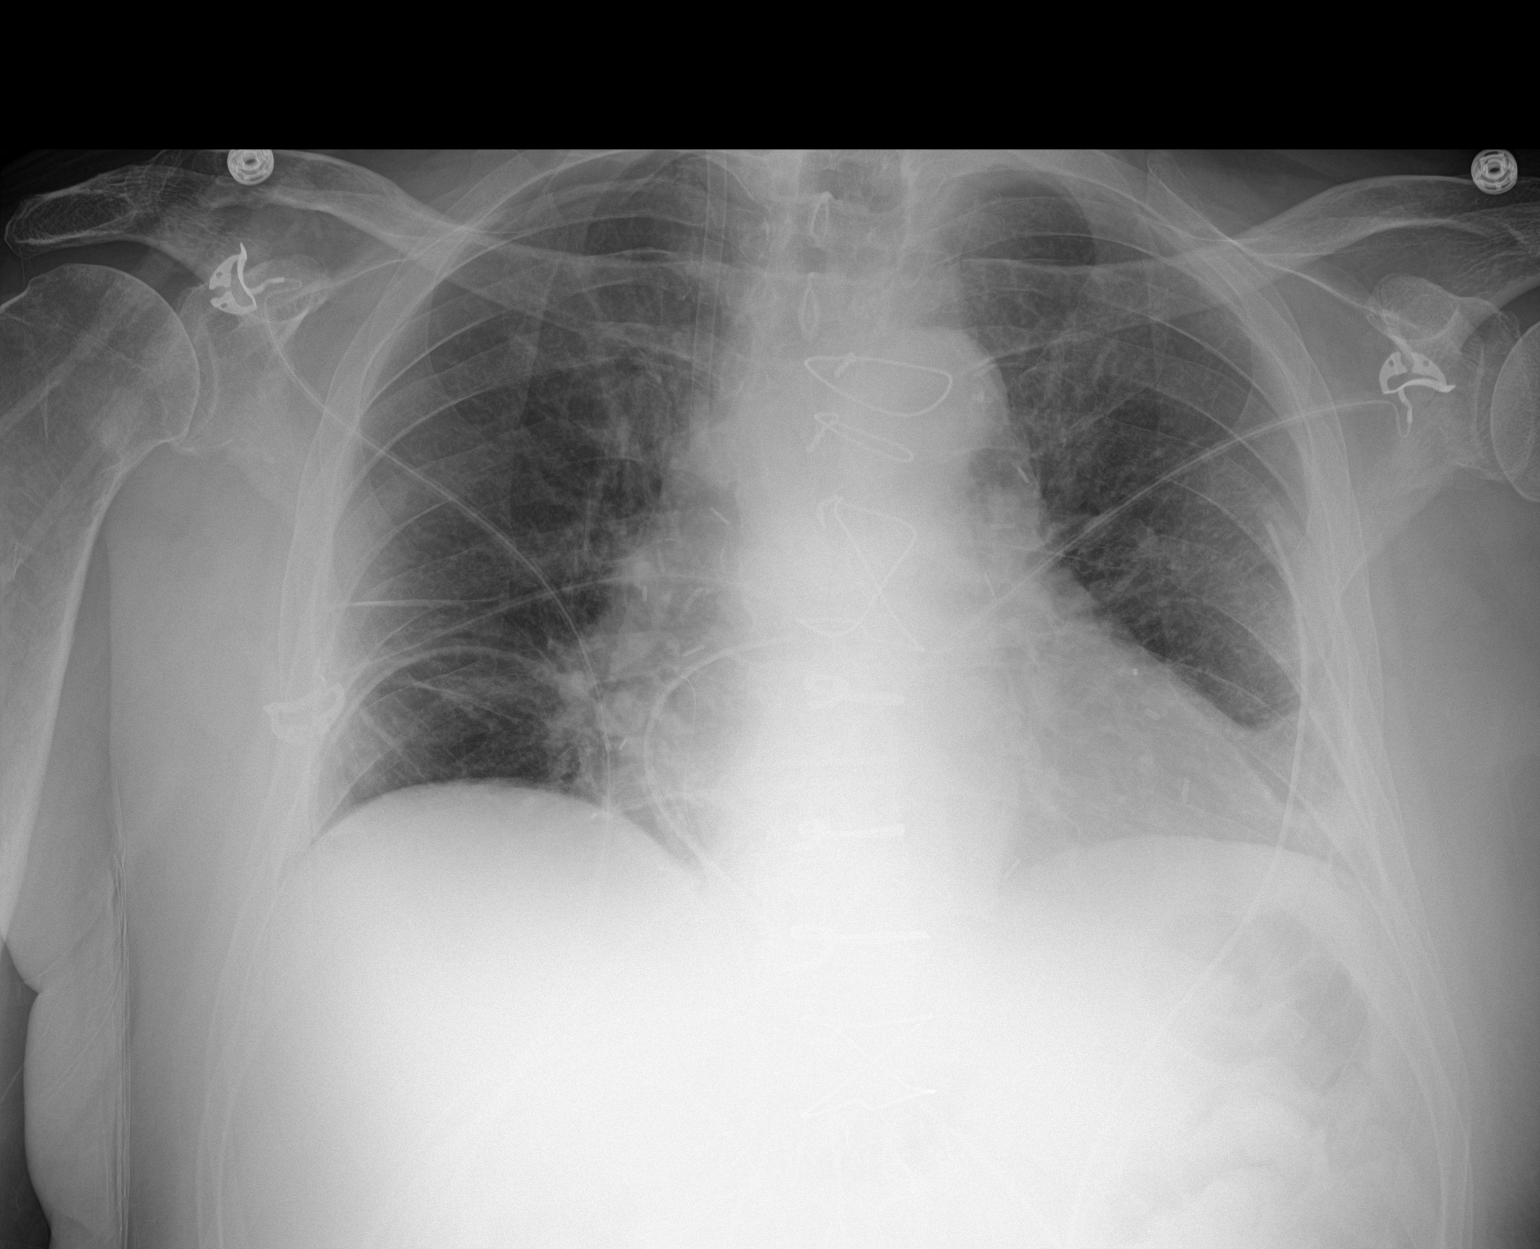

[1 of 1 positions shown; findings below may reference images not displayed]

FINDINGS: The PA catheter is been removed. A right IJ sheath remains in good
position. A left chest tube is stable. No pneumothorax. No change in
the cardiomediastinal silhouette. Minimal atelectasis in the left
base. The lungs are otherwise clear.
IMPRESSION: 1. Support apparatus as above.
2. Mild atelectasis in the left base.
3. No other acute abnormalities.

## 2020-03-28 MED ORDER — CARVEDILOL 6.25 MG PO TABS
6.2500 mg | ORAL_TABLET | Freq: Two times a day (BID) | ORAL | Status: DC
  Administered 2020-03-28 – 2020-04-02 (×9): 6.25 mg via ORAL
  Filled 2020-03-28 (×10): qty 1

## 2020-03-28 MED ORDER — INSULIN ASPART 100 UNIT/ML ~~LOC~~ SOLN
0.0000 [IU] | Freq: Three times a day (TID) | SUBCUTANEOUS | Status: DC
  Administered 2020-03-28 (×2): 3 [IU] via SUBCUTANEOUS
  Administered 2020-03-29 – 2020-04-01 (×4): 2 [IU] via SUBCUTANEOUS

## 2020-03-28 MED ORDER — LEVALBUTEROL HCL 0.63 MG/3ML IN NEBU: 0.6300 mg | INHALATION_SOLUTION | Freq: Four times a day (QID) | RESPIRATORY_TRACT | Status: DC | PRN

## 2020-03-28 MED ORDER — INSULIN DETEMIR 100 UNIT/ML ~~LOC~~ SOLN
25.0000 [IU] | Freq: Two times a day (BID) | SUBCUTANEOUS | Status: DC
  Administered 2020-03-28 (×2): 25 [IU] via SUBCUTANEOUS
  Filled 2020-03-28 (×5): qty 0.25

## 2020-03-28 MED ORDER — INSULIN ASPART 100 UNIT/ML ~~LOC~~ SOLN: 0.0000 [IU] | Freq: Every day | SUBCUTANEOUS | Status: DC

## 2020-03-28 NOTE — Plan of Care (Signed)
  Problem: Education: Goal: Understanding of cardiac disease, CV risk reduction, and recovery process will improve Outcome: Progressing Goal: Understanding of medication regimen will improve Outcome: Progressing Goal: Individualized Educational Video(s) Outcome: Progressing   Problem: Activity: Goal: Ability to tolerate increased activity will improve Outcome: Progressing   Problem: Cardiac: Goal: Ability to achieve and maintain adequate cardiopulmonary perfusion will improve Outcome: Progressing   Problem: Health Behavior/Discharge Planning: Goal: Ability to safely manage health-related needs after discharge will improve Outcome: Progressing   Problem: Education: Goal: Knowledge of General Education information will improve Description: Including pain rating scale, medication(s)/side effects and non-pharmacologic comfort measures Outcome: Progressing   Problem: Health Behavior/Discharge Planning: Goal: Ability to manage health-related needs will improve Outcome: Progressing   Problem: Clinical Measurements: Goal: Ability to maintain clinical measurements within normal limits will improve Outcome: Progressing Goal: Will remain free from infection Outcome: Progressing Goal: Diagnostic test results will improve Outcome: Progressing Goal: Respiratory complications will improve Outcome: Progressing Goal: Cardiovascular complication will be avoided Outcome: Progressing   Problem: Activity: Goal: Risk for activity intolerance will decrease Outcome: Progressing   Problem: Nutrition: Goal: Adequate nutrition will be maintained Outcome: Progressing   Problem: Coping: Goal: Level of anxiety will decrease Outcome: Progressing   Problem: Elimination: Goal: Will not experience complications related to bowel motility Outcome: Progressing Goal: Will not experience complications related to urinary retention Outcome: Progressing   Problem: Pain Managment: Goal: General  experience of comfort will improve Outcome: Progressing   Problem: Safety: Goal: Ability to remain free from injury will improve Outcome: Progressing   Problem: Skin Integrity: Goal: Risk for impaired skin integrity will decrease Outcome: Progressing   Problem: Education: Goal: Will demonstrate proper wound care and an understanding of methods to prevent future damage Outcome: Progressing Goal: Knowledge of disease or condition will improve Outcome: Progressing Goal: Knowledge of the prescribed therapeutic regimen will improve Outcome: Progressing Goal: Individualized Educational Video(s) Outcome: Progressing   Problem: Activity: Goal: Risk for activity intolerance will decrease Outcome: Progressing   Problem: Cardiac: Goal: Will achieve and/or maintain hemodynamic stability Outcome: Progressing   Problem: Clinical Measurements: Goal: Postoperative complications will be avoided or minimized Outcome: Progressing   Problem: Respiratory: Goal: Respiratory status will improve Outcome: Progressing   Problem: Skin Integrity: Goal: Wound healing without signs and symptoms of infection Outcome: Progressing Goal: Risk for impaired skin integrity will decrease Outcome: Progressing   Problem: Urinary Elimination: Goal: Ability to achieve and maintain adequate renal perfusion and functioning will improve Outcome: Progressing

## 2020-03-28 NOTE — Progress Notes (Signed)
3 Days Post-Op Procedure(s) (LRB): CORONARY ARTERY BYPASS GRAFTING (CABG), ON PUMP, TIMES FOUR, USING BILATERAL INTERNAL MAMMARY ARTERIES AND LEFT RADIAL ARTERY (N/A) LEFT RADIAL ARTERY HARVEST (Left) TRANSESOPHAGEAL ECHOCARDIOGRAM (TEE) (N/A) INDOCYANINE GREEN FLUORESCENCE IMAGING (ICG) (N/A) Subjective: Up in chair, in good spirits, no complaints  Objective: Vital signs in last 24 hours: Temp:  [98.2 F (36.8 C)-98.6 F (37 C)] 98.2 F (36.8 C) (03/04 2300) Pulse Rate:  [87-106] 102 (03/05 0630) Cardiac Rhythm: Sinus tachycardia (03/05 0400) Resp:  [18-69] 24 (03/05 0630) BP: (87-124)/(50-107) 118/74 (03/05 0630) SpO2:  [95 %-100 %] 99 % (03/05 0808) Arterial Line BP: (124-127)/(49-50) 127/50 (03/04 1000) Weight:  [103.4 kg] 103.4 kg (03/05 0500)  Hemodynamic parameters for last 24 hours: PAP: (25-31)/(8-13) 31/13  Intake/Output from previous day: 03/04 0701 - 03/05 0700 In: 1498 [P.O.:480; I.V.:1018] Out: 2630 [Urine:1930; Chest Tube:700] Intake/Output this shift: Total I/O In: -  Out: 70 [Chest Tube:70]  General appearance: alert, cooperative and no distress Neurologic: intact Heart: tachy, regular Lungs: diminished breath sounds bibasilar Abdomen: normal findings: soft, non-tender Wound: Preveena wound VAC in place  Lab Results: Recent Labs    03/27/20 0459 03/28/20 0229  WBC 19.3* 15.6*  HGB 8.5* 9.1*  HCT 26.0* 27.4*  PLT 143* 168   BMET:  Recent Labs    03/27/20 0459 03/28/20 0229  NA 132* 132*  K 3.7 3.9  CL 105 104  CO2 20* 22  GLUCOSE 162* 111*  BUN 13 14  CREATININE 0.90 0.92  CALCIUM 7.2* 7.5*    PT/INR:  Recent Labs    03/25/20 1500  LABPROT 16.6*  INR 1.4*   ABG    Component Value Date/Time   PHART 7.356 03/25/2020 2042   HCO3 17.9 (L) 03/25/2020 2042   TCO2 19 (L) 03/25/2020 2042   ACIDBASEDEF 7.0 (H) 03/25/2020 2042   O2SAT 61.4 03/28/2020 0229   CBG (last 3)  Recent Labs    03/28/20 0518 03/28/20 0644  03/28/20 0745  GLUCAP 108* 113* 157*    Assessment/Plan: S/P Procedure(s) (LRB): CORONARY ARTERY BYPASS GRAFTING (CABG), ON PUMP, TIMES FOUR, USING BILATERAL INTERNAL MAMMARY ARTERIES AND LEFT RADIAL ARTERY (N/A) LEFT RADIAL ARTERY HARVEST (Left) TRANSESOPHAGEAL ECHOCARDIOGRAM (TEE) (N/A) INDOCYANINE GREEN FLUORESCENCE IMAGING (ICG) (N/A) -POD # 3 NEURO- intact CV- Sinus tachy this AM- resume Coreg  Co-ox 61 on milrinone 0.125, off norepi  RESP_ IS for basilar atelectasis RENAL- creatinine normal, continue diuresis ENDO - CBG mildly elevated on insulin drip  transition to Levemir + SSI GI- on regular diet Deconditioning- mobilize as toelrated CT > 700 ml yesterday- will leave in for now   LOS: 7 days    Melrose Nakayama 03/28/2020

## 2020-03-28 NOTE — Plan of Care (Signed)
  Problem: Education: Goal: Understanding of cardiac disease, CV risk reduction, and recovery process will improve Outcome: Progressing   Problem: Activity: Goal: Ability to tolerate increased activity will improve Outcome: Progressing   Problem: Cardiac: Goal: Ability to achieve and maintain adequate cardiopulmonary perfusion will improve Outcome: Progressing   Problem: Clinical Measurements: Goal: Ability to maintain clinical measurements within normal limits will improve Outcome: Progressing   Problem: Clinical Measurements: Goal: Respiratory complications will improve Outcome: Progressing   Problem: Clinical Measurements: Goal: Postoperative complications will be avoided or minimized Outcome: Progressing

## 2020-03-28 NOTE — Progress Notes (Signed)
      [address]       [place] [address]             [phone]      Stable day  BP 110/78 (BP Location: Right Arm)   Pulse (!) 105   Temp 98.3 F (36.8 C)   Resp (!) 29   Ht 6\' 1"  (1.854 m)   Wt 103.4 kg   SpO2 96%   BMI 30.08 kg/m  RA 94% sat  Intake/Output Summary (Last 24 hours) at 03/28/2020 1816 Last data filed at 03/28/2020 1800 Gross per 24 hour  Intake 1722.09 ml  Output 3165 ml  Net -1442.91 ml   CBG 108-171  Doing well Continue diuresis  Remo Lipps C. Roxan Hockey, MD [facility] [phone]

## 2020-03-29 ENCOUNTER — Inpatient Hospital Stay (HOSPITAL_COMMUNITY): Payer: Medicare HMO

## 2020-03-29 LAB — BASIC METABOLIC PANEL
Anion gap: 8 (ref 5–15)
BUN: 14 mg/dL (ref 8–23)
CO2: 23 mmol/L (ref 22–32)
Calcium: 7.5 mg/dL — ABNORMAL LOW (ref 8.9–10.3)
Chloride: 105 mmol/L (ref 98–111)
Creatinine, Ser: 0.83 mg/dL (ref 0.61–1.24)
GFR, Estimated: >60 mL/min (ref >60)
Glucose, Bld: 79 mg/dL (ref 70–99)
Potassium: 3.3 mmol/L — ABNORMAL LOW (ref 3.5–5.1)
Sodium: 136 mmol/L (ref 135–145)

## 2020-03-29 LAB — CBC
HCT: 28.4 % — ABNORMAL LOW (ref 39.0–52.0)
Hemoglobin: 9.3 g/dL — ABNORMAL LOW (ref 13.0–17.0)
MCH: 30.3 pg (ref 26.0–34.0)
MCHC: 32.7 g/dL (ref 30.0–36.0)
MCV: 92.5 fL (ref 80.0–100.0)
Platelets: 214 10*3/uL (ref 150–400)
RBC: 3.07 MIL/uL — ABNORMAL LOW (ref 4.22–5.81)
RDW: 14.1 % (ref 11.5–15.5)
WBC: 12.2 10*3/uL — ABNORMAL HIGH (ref 4.0–10.5)
nRBC: 0 % (ref 0.0–0.2)

## 2020-03-29 LAB — GLUCOSE, CAPILLARY
Glucose-Capillary: 71 mg/dL (ref 70–99)
Glucose-Capillary: 109 mg/dL — ABNORMAL HIGH (ref 70–99)
Glucose-Capillary: 128 mg/dL — ABNORMAL HIGH (ref 70–99)
Glucose-Capillary: 120 mg/dL — ABNORMAL HIGH (ref 70–99)
Glucose-Capillary: 155 mg/dL — ABNORMAL HIGH (ref 70–99)

## 2020-03-29 LAB — COOXEMETRY PANEL
Carboxyhemoglobin: 1.4 % (ref 0.5–1.5)
Methemoglobin: 1.1 % (ref 0.0–1.5)
O2 Saturation: 60.3 %
Total hemoglobin: 9.6 g/dL — ABNORMAL LOW (ref 12.0–16.0)

## 2020-03-29 IMAGING — DX DG CHEST 1V PORT
1 series · 1 of 1 positions shown · non-contrast
Comparison: [DATE]

CLINICAL DATA: Status post CABG.  History of CHF.

EXAM:
PORTABLE CHEST 1 VIEW

[chest ap]
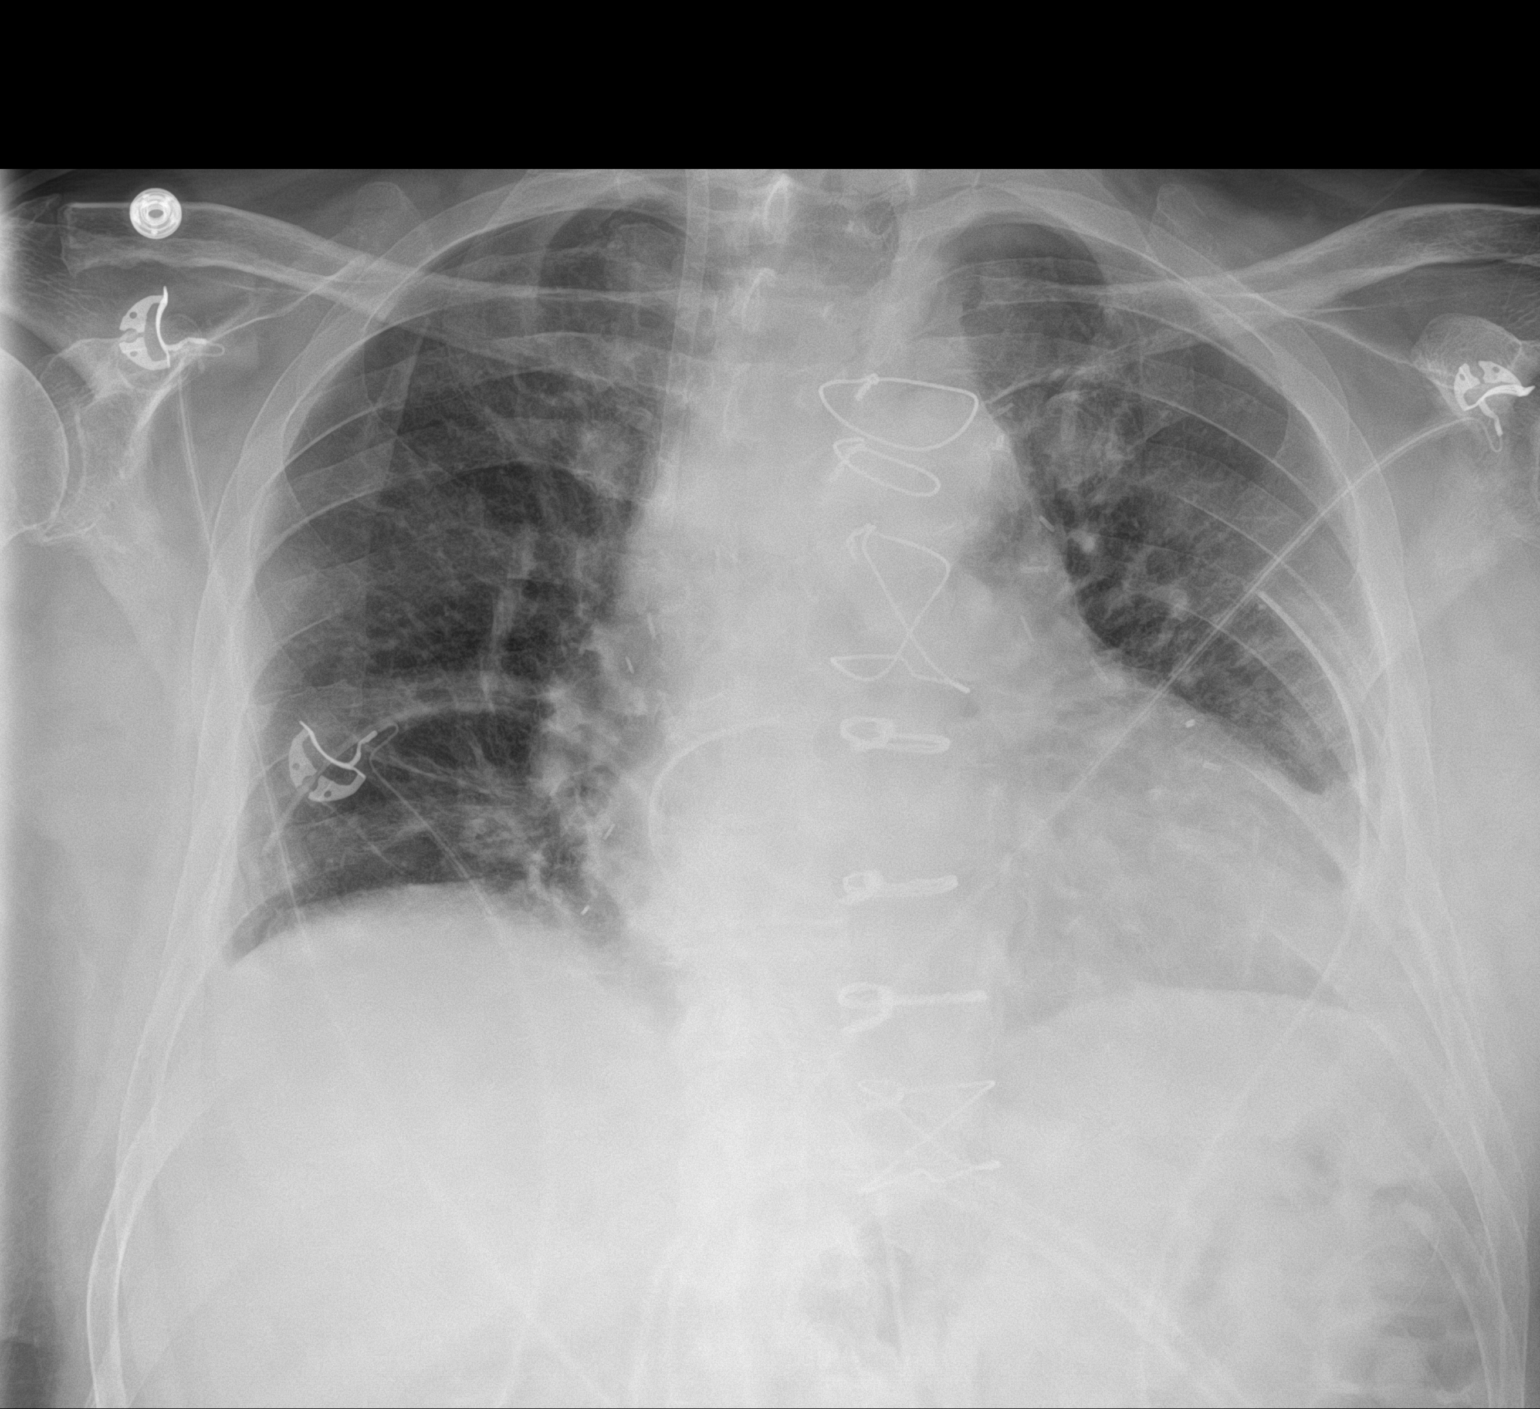

[1 of 1 positions shown; findings below may reference images not displayed]

FINDINGS: The right IJ sheath and left chest tube are stable. No left-sided
pneumothorax. There is a tiny right-sided pneumothorax which is in
retrospect unchanged since [DATE] but not visualized [DATE]. Mild increased interstitial opacities. Stable cardiomegaly.
The hila and mediastinum are unchanged.
IMPRESSION: 1. There is a tiny right apical pneumothorax measuring 3 mm which
is, in retrospect, unchanged since [DATE] but was not
visualized [DATE].
2. Mild increased interstitial markings in the lungs could represent
pulmonary venous congestion/mild edema.

These results will be called to the ordering clinician or
representative by the Radiologist Assistant, and communication
documented in the PACS or [REDACTED].

## 2020-03-29 MED ORDER — AMIODARONE LOAD VIA INFUSION: 150.0000 mg | Freq: Once | INTRAVENOUS | Status: DC

## 2020-03-29 MED ORDER — INSULIN DETEMIR 100 UNIT/ML ~~LOC~~ SOLN
20.0000 [IU] | Freq: Two times a day (BID) | SUBCUTANEOUS | Status: DC
  Administered 2020-03-29 – 2020-04-02 (×9): 20 [IU] via SUBCUTANEOUS
  Filled 2020-03-29 (×11): qty 0.2

## 2020-03-29 MED ORDER — ENOXAPARIN SODIUM 40 MG/0.4ML ~~LOC~~ SOLN
40.0000 mg | SUBCUTANEOUS | Status: DC
  Administered 2020-03-29 – 2020-04-02 (×5): 40 mg via SUBCUTANEOUS
  Filled 2020-03-29 (×5): qty 0.4

## 2020-03-29 MED ORDER — AMIODARONE LOAD VIA INFUSION
150.0000 mg | Freq: Once | INTRAVENOUS | Status: DC
  Filled 2020-03-29: qty 83.34

## 2020-03-29 MED ORDER — AMIODARONE HCL 200 MG PO TABS
400.0000 mg | ORAL_TABLET | Freq: Two times a day (BID) | ORAL | Status: DC
  Administered 2020-03-29 – 2020-04-02 (×9): 400 mg via ORAL
  Filled 2020-03-29 (×9): qty 2

## 2020-03-29 MED ORDER — AMIODARONE HCL IN DEXTROSE 360-4.14 MG/200ML-% IV SOLN: 60.0000 mg/h | INTRAVENOUS | Status: DC

## 2020-03-29 MED ORDER — POTASSIUM CHLORIDE CRYS ER 20 MEQ PO TBCR
20.0000 meq | EXTENDED_RELEASE_TABLET | ORAL | Status: AC
Start: 2020-03-29 — End: 2020-03-29
  Administered 2020-03-29 (×2): 20 meq via ORAL
  Filled 2020-03-29 (×2): qty 1

## 2020-03-29 MED ORDER — AMIODARONE HCL IN DEXTROSE 360-4.14 MG/200ML-% IV SOLN: 30.0000 mg/h | INTRAVENOUS | Status: DC

## 2020-03-29 MED ORDER — POTASSIUM CHLORIDE 10 MEQ/50ML IV SOLN
10.0000 meq | INTRAVENOUS | Status: AC
Start: 2020-03-29 — End: 2020-03-29
  Administered 2020-03-29 (×4): 10 meq via INTRAVENOUS
  Filled 2020-03-29 (×2): qty 50

## 2020-03-29 NOTE — Progress Notes (Signed)
      [address]       [place] [address]             [phone]      Sleeping  BP 103/71   Pulse 97   Temp 97.8 F (36.6 C) (Oral)   Resp (!) 21   Ht 6\' 1"  (1.854 m)   Wt 101.2 kg   SpO2 96%   BMI 29.44 kg/m   Intake/Output Summary (Last 24 hours) at 03/29/2020 1906 Last data filed at 03/29/2020 1900 Gross per 24 hour  Intake 1289.04 ml  Output 3685 ml  Net -2395.96 ml   Diuresing well off milrinone No further A fib, on PO amiodarone  Remo Lipps C. Roxan Hockey, MD [facility] [phone]

## 2020-03-29 NOTE — Progress Notes (Signed)
4 Days Post-Op Procedure(s) (LRB): CORONARY ARTERY BYPASS GRAFTING (CABG), ON PUMP, TIMES FOUR, USING BILATERAL INTERNAL MAMMARY ARTERIES AND LEFT RADIAL ARTERY (N/A) LEFT RADIAL ARTERY HARVEST (Left) TRANSESOPHAGEAL ECHOCARDIOGRAM (TEE) (N/A) INDOCYANINE GREEN FLUORESCENCE IMAGING (ICG) (N/A) Subjective: Some pain in shoulders since surgery  Objective: Vital signs in last 24 hours: Temp:  [97.5 F (36.4 C)-98.3 F (36.8 C)] 98.2 F (36.8 C) (03/06 0737) Pulse Rate:  [92-122] 101 (03/06 0700) Cardiac Rhythm: Normal sinus rhythm;Sinus tachycardia (03/06 0355) Resp:  [19-45] 23 (03/06 0700) BP: (102-137)/(72-89) 108/72 (03/06 0700) SpO2:  [95 %-100 %] 99 % (03/06 0700) Weight:  [101.2 kg] 101.2 kg (03/06 0600)  Hemodynamic parameters for last 24 hours:    Intake/Output from previous day: 03/05 0701 - 03/06 0700 In: 1285.1 [P.O.:1090; I.V.:195.1] Out: 5409 [Urine:3130; Chest Tube:350] Intake/Output this shift: No intake/output data recorded.  General appearance: alert, cooperative and no distress Neurologic: intact Heart: regular rate and rhythm Lungs: diminished breath sounds bibasilar Abdomen: normal findings: soft, non-tender  Lab Results: Recent Labs    03/28/20 0229 03/29/20 0348  WBC 15.6* 12.2*  HGB 9.1* 9.3*  HCT 27.4* 28.4*  PLT 168 214   BMET:  Recent Labs    03/28/20 0229 03/29/20 0348  NA 132* 136  K 3.9 3.3*  CL 104 105  CO2 22 23  GLUCOSE 111* 79  BUN 14 14  CREATININE 0.92 0.83  CALCIUM 7.5* 7.5*    PT/INR: No results for input(s): LABPROT, INR in the last 72 hours. ABG    Component Value Date/Time   PHART 7.356 03/25/2020 2042   HCO3 17.9 (L) 03/25/2020 2042   TCO2 19 (L) 03/25/2020 2042   ACIDBASEDEF 7.0 (H) 03/25/2020 2042   O2SAT 60.3 03/29/2020 0348   CBG (last 3)  Recent Labs    03/28/20 2216 03/29/20 0553 03/29/20 0734  GLUCAP 119* 71 109*    Assessment/Plan: S/P Procedure(s) (LRB): CORONARY ARTERY BYPASS GRAFTING  (CABG), ON PUMP, TIMES FOUR, USING BILATERAL INTERNAL MAMMARY ARTERIES AND LEFT RADIAL ARTERY (N/A) LEFT RADIAL ARTERY HARVEST (Left) TRANSESOPHAGEAL ECHOCARDIOGRAM (TEE) (N/A) INDOCYANINE GREEN FLUORESCENCE IMAGING (ICG) (N/A) -Overall looks good CV- transient A fib this AM, converted to SR prior to amiodarone being started  Will start PO amiodarone  Continue Coreg  Will stop milrinone and follow Co-ox RESP- CXR shows some vascular congestion  Continue IS, diuresis RENAL- creatinine OK, hypokalemia- supplement K  Continue diuresis ENDO- CBG better controlled, decrease levemir GI- tolerating PO DC chest tubes Ambulate Will add enoxaparin   LOS: 8 days    Melrose Nakayama 03/29/2020

## 2020-03-29 NOTE — Plan of Care (Signed)
  Problem: Education: Goal: Understanding of cardiac disease, CV risk reduction, and recovery process will improve Outcome: Progressing Goal: Understanding of medication regimen will improve Outcome: Progressing Goal: Individualized Educational Video(s) Outcome: Progressing   Problem: Activity: Goal: Ability to tolerate increased activity will improve Outcome: Progressing   Problem: Cardiac: Goal: Ability to achieve and maintain adequate cardiopulmonary perfusion will improve Outcome: Progressing   Problem: Health Behavior/Discharge Planning: Goal: Ability to safely manage health-related needs after discharge will improve Outcome: Progressing   Problem: Education: Goal: Knowledge of General Education information will improve Description: Including pain rating scale, medication(s)/side effects and non-pharmacologic comfort measures Outcome: Progressing   Problem: Health Behavior/Discharge Planning: Goal: Ability to manage health-related needs will improve Outcome: Progressing   Problem: Clinical Measurements: Goal: Ability to maintain clinical measurements within normal limits will improve Outcome: Progressing Goal: Will remain free from infection Outcome: Progressing Goal: Diagnostic test results will improve Outcome: Progressing Goal: Respiratory complications will improve Outcome: Progressing Goal: Cardiovascular complication will be avoided Outcome: Progressing   Problem: Activity: Goal: Risk for activity intolerance will decrease Outcome: Progressing   Problem: Nutrition: Goal: Adequate nutrition will be maintained Outcome: Progressing   Problem: Coping: Goal: Level of anxiety will decrease Outcome: Progressing   Problem: Elimination: Goal: Will not experience complications related to bowel motility Outcome: Progressing Goal: Will not experience complications related to urinary retention Outcome: Progressing   Problem: Pain Managment: Goal: General  experience of comfort will improve Outcome: Progressing   Problem: Safety: Goal: Ability to remain free from injury will improve Outcome: Progressing   Problem: Skin Integrity: Goal: Risk for impaired skin integrity will decrease Outcome: Progressing   Problem: Education: Goal: Will demonstrate proper wound care and an understanding of methods to prevent future damage Outcome: Progressing Goal: Knowledge of disease or condition will improve Outcome: Progressing Goal: Knowledge of the prescribed therapeutic regimen will improve Outcome: Progressing Goal: Individualized Educational Video(s) Outcome: Progressing   Problem: Activity: Goal: Risk for activity intolerance will decrease Outcome: Progressing   Problem: Cardiac: Goal: Will achieve and/or maintain hemodynamic stability Outcome: Progressing   Problem: Clinical Measurements: Goal: Postoperative complications will be avoided or minimized Outcome: Progressing   Problem: Respiratory: Goal: Respiratory status will improve Outcome: Progressing   Problem: Skin Integrity: Goal: Wound healing without signs and symptoms of infection Outcome: Progressing Goal: Risk for impaired skin integrity will decrease Outcome: Progressing   Problem: Urinary Elimination: Goal: Ability to achieve and maintain adequate renal perfusion and functioning will improve Outcome: Progressing

## 2020-03-30 ENCOUNTER — Telehealth: Payer: Self-pay | Admitting: Hematology

## 2020-03-30 ENCOUNTER — Ambulatory Visit: Payer: Medicare HMO | Admitting: Cardiology

## 2020-03-30 ENCOUNTER — Inpatient Hospital Stay (HOSPITAL_COMMUNITY): Payer: Medicare HMO

## 2020-03-30 LAB — BASIC METABOLIC PANEL
Anion gap: 7 (ref 5–15)
BUN: 13 mg/dL (ref 8–23)
CO2: 19 mmol/L — ABNORMAL LOW (ref 22–32)
Calcium: 6.2 mg/dL — CL (ref 8.9–10.3)
Chloride: 113 mmol/L — ABNORMAL HIGH (ref 98–111)
Creatinine, Ser: 0.68 mg/dL (ref 0.61–1.24)
GFR, Estimated: >60 mL/min (ref >60)
Glucose, Bld: 57 mg/dL — ABNORMAL LOW (ref 70–99)
Potassium: 2.9 mmol/L — ABNORMAL LOW (ref 3.5–5.1)
Sodium: 139 mmol/L (ref 135–145)

## 2020-03-30 LAB — CBC
HCT: 24.8 % — ABNORMAL LOW (ref 39.0–52.0)
Hemoglobin: 8.2 g/dL — ABNORMAL LOW (ref 13.0–17.0)
MCH: 30.7 pg (ref 26.0–34.0)
MCHC: 33.1 g/dL (ref 30.0–36.0)
MCV: 92.9 fL (ref 80.0–100.0)
Platelets: 214 10*3/uL (ref 150–400)
RBC: 2.67 MIL/uL — ABNORMAL LOW (ref 4.22–5.81)
RDW: 14 % (ref 11.5–15.5)
WBC: 9.1 10*3/uL (ref 4.0–10.5)
nRBC: 0 % (ref 0.0–0.2)

## 2020-03-30 LAB — COOXEMETRY PANEL
Carboxyhemoglobin: 1.4 % (ref 0.5–1.5)
Carboxyhemoglobin: 1.2 % (ref 0.5–1.5)
Methemoglobin: 0.9 % (ref 0.0–1.5)
Methemoglobin: 0.8 % (ref 0.0–1.5)
O2 Saturation: 59.8 %
O2 Saturation: 56.5 %
Total hemoglobin: 9.5 g/dL — ABNORMAL LOW (ref 12.0–16.0)
Total hemoglobin: 9.5 g/dL — ABNORMAL LOW (ref 12.0–16.0)

## 2020-03-30 LAB — GLUCOSE, CAPILLARY
Glucose-Capillary: 61 mg/dL — ABNORMAL LOW (ref 70–99)
Glucose-Capillary: 71 mg/dL (ref 70–99)
Glucose-Capillary: 159 mg/dL — ABNORMAL HIGH (ref 70–99)
Glucose-Capillary: 129 mg/dL — ABNORMAL HIGH (ref 70–99)
Glucose-Capillary: 69 mg/dL — ABNORMAL LOW (ref 70–99)
Glucose-Capillary: 88 mg/dL (ref 70–99)
Glucose-Capillary: 123 mg/dL — ABNORMAL HIGH (ref 70–99)

## 2020-03-30 IMAGING — DX DG CHEST 1V PORT
1 series · 1 of 1 positions shown · non-contrast
Comparison: [DATE]

CLINICAL DATA: Chest pain, open heart surgery

EXAM:
PORTABLE CHEST 1 VIEW

[chest]
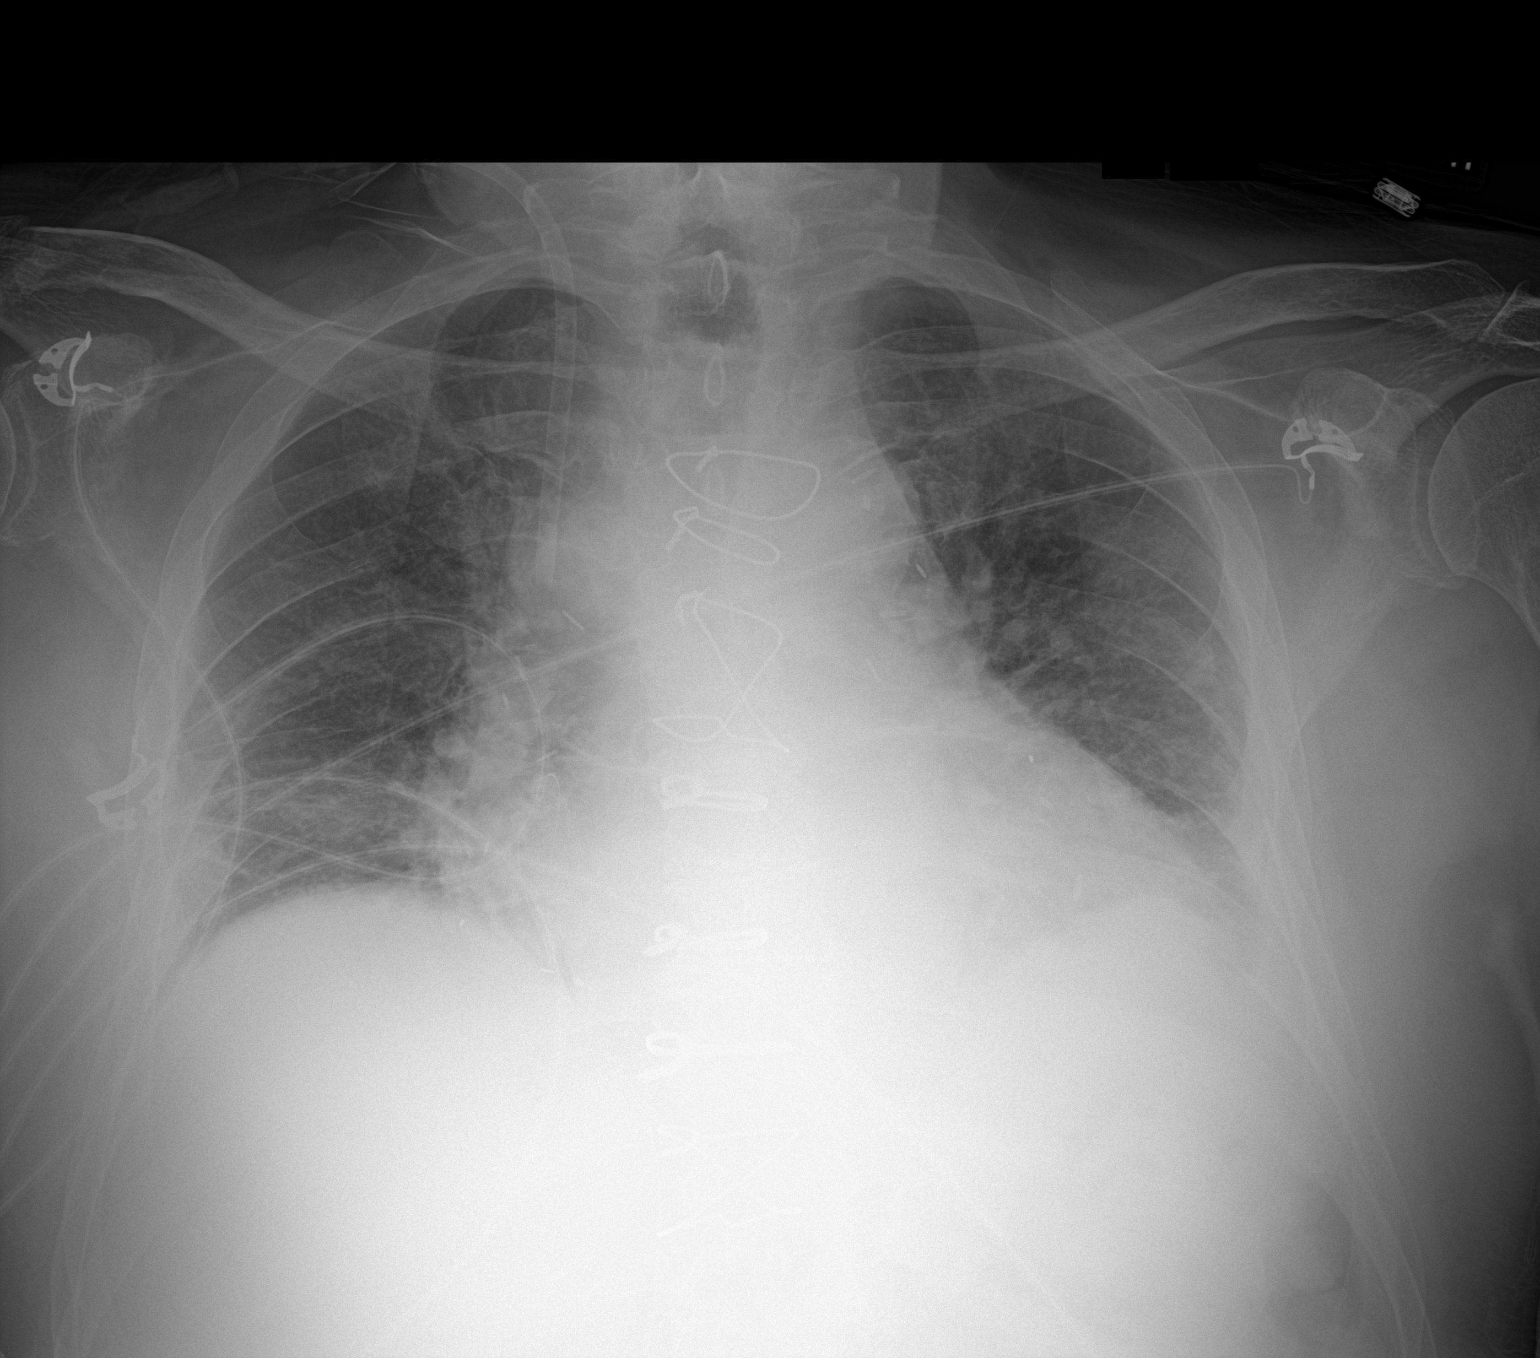

[1 of 1 positions shown; findings below may reference images not displayed]

FINDINGS: Right internal jugular Cordis introducer is unchanged. Pulmonary
insufflation has diminished and low lung volumes are present. Lungs,
however, are clear. No pneumothorax or pleural effusion. Coronary
artery bypass grafting has been performed.
IMPRESSION: Pulmonary hypoinflation.

Resolved right apical pneumothorax.

## 2020-03-30 MED ORDER — CHLORHEXIDINE GLUCONATE CLOTH 2 % EX PADS
6.0000 | MEDICATED_PAD | Freq: Every day | CUTANEOUS | Status: DC
  Administered 2020-03-30 – 2020-04-02 (×4): 6 via TOPICAL

## 2020-03-30 MED ORDER — ISOSORBIDE DINITRATE 10 MG PO TABS
10.0000 mg | ORAL_TABLET | Freq: Three times a day (TID) | ORAL | Status: DC
  Administered 2020-03-30 – 2020-04-02 (×7): 10 mg via ORAL
  Filled 2020-03-30 (×9): qty 1

## 2020-03-30 MED ORDER — POTASSIUM CHLORIDE 10 MEQ/50ML IV SOLN
10.0000 meq | INTRAVENOUS | Status: AC
Start: 2020-03-30 — End: 2020-03-30
  Administered 2020-03-30 (×3): 10 meq via INTRAVENOUS
  Filled 2020-03-30 (×3): qty 50

## 2020-03-30 MED ORDER — POTASSIUM CHLORIDE CRYS ER 20 MEQ PO TBCR
40.0000 meq | EXTENDED_RELEASE_TABLET | Freq: Two times a day (BID) | ORAL | Status: DC
  Administered 2020-03-30 (×2): 40 meq via ORAL
  Filled 2020-03-30 (×2): qty 2

## 2020-03-30 NOTE — Telephone Encounter (Signed)
I received a call from the pt's wife to cancel his new pt appt w/Dr. Irene Limbo. Pt recently had bypass surgery and Mrs. Grayson is unsure when he will be able to reschedule.She will call back when they can

## 2020-03-30 NOTE — Progress Notes (Signed)
Inpatient Diabetes Program Recommendations  AACE/ADA: New Consensus Statement on Inpatient Glycemic Control   Target Ranges:  Prepandial:   less than 140 mg/dL      Peak postprandial:   less than 180 mg/dL (1-2 hours)      Critically ill patients:  140 - 180 mg/dL  Results for Roy [name], Roy [name]" (MRN [id number]) as of 03/30/2020 10:48  Ref. Range 03/29/2020 07:34 03/29/2020 11:46 03/29/2020 15:48 03/29/2020 21:26 03/30/2020 06:36 03/30/2020 07:02 03/30/2020 08:22  Glucose-Capillary Latest Ref Range: 70 - 99 mg/dL 109 (H) 128 (H) 120 (H) 155 (H) 61 (L) 71 159 (H)    Review of Glycemic Control  Diabetes history: DM2 Outpatient Diabetes medications: Metformin 1000 mg BID, Actos 30 mg daily Current orders for Inpatient glycemic control: Levemir 20 units BID, Novolog 0-15 units TID with meals, Novolog 0-5 units QHS  Inpatient Diabetes Program Recommendations:    Insulin: Fasting glucose 61 mg/dl at 6:36 am today. Please consider decreasing Leveimr to 17 units BID.  Thanks, Barnie Alderman, RN, MSN, CDE Diabetes Coordinator Inpatient Diabetes Program [phone] (Team Pager from 8am to 5pm)

## 2020-03-30 NOTE — Consult Note (Signed)
   [facility] Inpatient Consult   03/30/2020  DEMONTRAY FRANTA 1945-05-18 [id number]  [place] [facility] [ACO] Patient: [employer]  LOS: 9 days  PCP: listed as Dr. Jani Gravel, [place] Associate  Patient screened for hospitalization with [facility] noted high risk score for unplanned readmission risk and any post hospital potential [place] Management service needs for post hospital transition.  Review of patient's medical record reveals patient is for home with wife.  Plan:  Continue to follow progress and disposition to assess for post hospital care management needs.    For questions contact:   Natividad Brood, RN BSN [facility] Liaison  [phone] business mobile phone Toll free office [phone]  Fax number: [phone] [email or web address]

## 2020-03-30 NOTE — Progress Notes (Signed)
Mobility Specialist: Progress Note   03/30/20 1630  Mobility  Activity Ambulated in hall  Level of Assistance Modified independent, requires aide device or extra time  Assistive Device Four wheel walker (EVA)  Distance Ambulated (ft) 390 ft  Mobility Response Tolerated well  Mobility performed by Mobility specialist  $Mobility charge 1 Mobility   Pre-Mobility: 109 HR Post-Mobility: 104 HR  Pt asx during ambulation. Pt back to bed after walk. Encouraged pt to walk one more time today with staff.   [facility] Lealon [place] Mobility Specialist Mobility Specialist Phone: [phone]

## 2020-03-30 NOTE — Progress Notes (Signed)
5 Days Post-Op Procedure(s) (LRB): CORONARY ARTERY BYPASS GRAFTING (CABG), ON PUMP, TIMES FOUR, USING BILATERAL INTERNAL MAMMARY ARTERIES AND LEFT RADIAL ARTERY (N/A) LEFT RADIAL ARTERY HARVEST (Left) TRANSESOPHAGEAL ECHOCARDIOGRAM (TEE) (N/A) INDOCYANINE GREEN FLUORESCENCE IMAGING (ICG) (N/A) Subjective: C/o scrotal swelling s/p left orchiectomy  Objective: Vital signs in last 24 hours: Temp:  [97.7 F (36.5 C)-98 F (36.7 C)] 97.9 F (36.6 C) (03/07 0306) Pulse Rate:  [79-110] 102 (03/07 0700) Cardiac Rhythm: Normal sinus rhythm (03/07 0400) Resp:  [17-46] 24 (03/07 0700) BP: (100-130)/(70-90) 130/79 (03/07 0700) SpO2:  [95 %-99 %] 97 % (03/07 0700) Weight:  [100.9 kg] 100.9 kg (03/07 0500)  Hemodynamic parameters for last 24 hours:    Intake/Output from previous day: 03/06 0701 - 03/07 0700 In: 1112.5 [P.O.:790; I.V.:122.5; IV Piggyback:200] Out: 2400 [Urine:2300; Chest Tube:100] Intake/Output this shift: No intake/output data recorded.  General appearance: alert and cooperative Neurologic: intact Heart: regular rate and rhythm, S1, S2 normal, no murmur, click, rub or gallop Lungs: clear to auscultation bilaterally Abdomen: soft, non-tender; bowel sounds normal; no masses,  no organomegaly Extremities: edema 1+ Wound: c/d/i mild scrotal swelling; no erythema  Lab Results: Recent Labs    03/29/20 0348 03/30/20 0419  WBC 12.2* 9.1  HGB 9.3* 8.2*  HCT 28.4* 24.8*  PLT 214 214   BMET:  Recent Labs    03/29/20 0348 03/30/20 0419  NA 136 139  K 3.3* 2.9*  CL 105 113*  CO2 23 19*  GLUCOSE 79 57*  BUN 14 13  CREATININE 0.83 0.68  CALCIUM 7.5* 6.2*    PT/INR: No results for input(s): LABPROT, INR in the last 72 hours. ABG    Component Value Date/Time   PHART 7.356 03/25/2020 2042   HCO3 17.9 (L) 03/25/2020 2042   TCO2 19 (L) 03/25/2020 2042   ACIDBASEDEF 7.0 (H) 03/25/2020 2042   O2SAT 59.8 03/30/2020 0419   CBG (last 3)  Recent Labs     03/30/20 0636 03/30/20 0702 03/30/20 0822  GLUCAP 61* 71 159*    Assessment/Plan: S/P Procedure(s) (LRB): CORONARY ARTERY BYPASS GRAFTING (CABG), ON PUMP, TIMES FOUR, USING BILATERAL INTERNAL MAMMARY ARTERIES AND LEFT RADIAL ARTERY (N/A) LEFT RADIAL ARTERY HARVEST (Left) TRANSESOPHAGEAL ECHOCARDIOGRAM (TEE) (N/A) INDOCYANINE GREEN FLUORESCENCE IMAGING (ICG) (N/A) Mobilize Diuresis Plan for transfer to step-down: see transfer orders   LOS: 9 days    [name] 03/30/2020

## 2020-03-30 NOTE — Progress Notes (Signed)
Hypoglycemic Event  CBG: 61 @ 0636  Treatment: 8 oz juice/soda  Symptoms: Vision changes  Follow-up CBG: Time:  0702 CBG Result:  71  Possible Reasons for Event: Inadequate meal intake  Comments/MD notified:  n/a    Roy [name]

## 2020-03-30 NOTE — Progress Notes (Signed)
Date and time results received: 03/30/20 0522  Test: Calcium Critical Value: 6.2  Name of Provider Notified: Dr. Roxan Hockey, TCTS  Orders Received? Or Actions Taken?: Orders Received - See Orders for details

## 2020-03-30 NOTE — Progress Notes (Signed)
CARDIAC REHAB PHASE I   PRE:  Rate/Rhythm: 104 ST in bathroom    BP: sitting     SaO2:   MODE:  Ambulation: 350 ft   POST:  Rate/Rhythm: 104 ST    BP: sitting 108/82     SaO2: 99 RA  Pt in bathroom. Able to walk with standby assist to [facility] in room. Steady, no c/o. Used [place] in hall, steady, no assist needed. To recliner. Pt wanting to walk more today. Encouraged more IS practice. Doing well. [phone]   [place], ACSM 03/30/2020 12:57 PM

## 2020-03-30 NOTE — Progress Notes (Addendum)
Pt arrived to rm 9 from [facility] with foley cath. Initiated tele. ChG wipe performed. VSS. Call bell within reach.   Lavenia Atlas, RN

## 2020-03-30 NOTE — TOC Initial Note (Addendum)
Transition of Care [facility]) - Initial/Assessment Note    Patient Details  Name: Roy [name] MRN: [id number] Date of Birth: Mar 18, 1945  Transition of Care [facility]) CM/SW Contact:    Bethena Roys, RN Phone Number: 03/30/2020, 11:36 AM  Clinical Narrative:  Risk for readmission assessment completed. Patient presented for shortness of breath-s/p R/L heart cath and post CABG 03-25-20. Prior to arrival patient was independent from home with his spouse. 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 Plan at this time will be to transition home once stable. Case Manager will continue to monitor for additional transition of care needs.                 Expected Discharge Plan: Home/Self Care Barriers to Discharge: Continued Medical Work up   Patient Goals and CMS Choice Patient states their goals for this hospitalization and ongoing recovery are:: to return home   Choice offered to / list presented to : NA  Expected Discharge Plan and Services Expected Discharge Plan: Home/Self Care In-house Referral: NA Discharge Planning Services: CM Consult Post Acute Care Choice: NA Living arrangements for the past 2 months: Single Family Home                 DME Arranged: N/A DME Agency: NA       HH Arranged: NA          Prior Living Arrangements/Services Living arrangements for the past 2 months: Single Family Home Lives with:: Spouse Patient language and need for interpreter reviewed:: Yes Do you feel safe going back to the place where you live?: Yes      Need for Family Participation in Patient Care: Yes (Comment) Care giver support system in place?: Yes (comment)   Criminal Activity/Legal Involvement Pertinent to Current Situation/Hospitalization: No - Comment as needed  Activities of Daily Living Home Assistive Devices/Equipment: Hearing aid ADL Screening (condition at time of admission) Patient's cognitive ability adequate  to safely complete daily activities?: Yes Is the patient deaf or have difficulty hearing?: Yes Does the patient have difficulty seeing, even when wearing glasses/contacts?: No Does the patient have difficulty concentrating, remembering, or making decisions?: No Patient able to express need for assistance with ADLs?: Yes Does the patient have difficulty dressing or bathing?: No Independently performs ADLs?: Yes (appropriate for developmental age) Does the patient have difficulty walking or climbing stairs?: No Weakness of Legs: None Weakness of Arms/Hands: None  Permission Sought/Granted Permission sought to share information with : Family Nature conservation officer                Emotional Assessment Appearance:: Appears stated age       Alcohol / Substance Use: Not Applicable Psych Involvement: No (comment)  Admission diagnosis:  Elevated troponin [R77.8] NSTEMI (non-ST elevated myocardial infarction) ([place]) [I21.4] Acute respiratory failure with hypoxia ([place]) [J96.01] Acute heart failure, unspecified heart failure type ([place]) [I50.9] Patient Active Problem List   Diagnosis Date Noted   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   Shortness of breath 03/21/2020   Acute pulmonary edema ([place]) 03/21/2020   Indigestion 03/21/2020   Troponin level elevated 03/21/2020   CHF (congestive heart failure), NYHA class III ([place]) 03/21/2020   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 03/21/2020   Squamous cell skin cancer- nose s/p removal 03/21/2020   NSTEMI (non-ST elevated myocardial infarction) ([place]) 03/21/2020   Hyperlipidemia 01/30/2015   Nonischemic cardiomyopathy ([place])  01/30/2015   Essential hypertension 01/30/2015   Faintness 01/30/2015   Chest pain 10/24/2014   Cardiomyopathy ([place]) 10/24/2014   Allergic rhinitis    Syncope    Nephrolithiasis    Plantar fasciitis    Hypovitaminosis D    PCP:  Jani Gravel, MD Pharmacy:   [facility], [address] [place] [address] Phone: [phone] Fax: [phone]   Readmission Risk Interventions Readmission Risk Prevention Plan 03/30/2020  Transportation Screening Complete  HRI or Home Care Consult Complete  Social Work Consult for [place] Planning/Counseling Complete  Palliative Care Screening Not Applicable  Medication Review Press photographer) Complete  Some recent data might be hidden

## 2020-03-31 ENCOUNTER — Inpatient Hospital Stay (HOSPITAL_COMMUNITY): Payer: Medicare HMO

## 2020-03-31 LAB — CBC
HCT: 28.2 % — ABNORMAL LOW (ref 39.0–52.0)
Hemoglobin: 9.3 g/dL — ABNORMAL LOW (ref 13.0–17.0)
MCH: 30.2 pg (ref 26.0–34.0)
MCHC: 33 g/dL (ref 30.0–36.0)
MCV: 91.6 fL (ref 80.0–100.0)
Platelets: 312 10*3/uL (ref 150–400)
RBC: 3.08 MIL/uL — ABNORMAL LOW (ref 4.22–5.81)
RDW: 13.8 % (ref 11.5–15.5)
WBC: 10.6 10*3/uL — ABNORMAL HIGH (ref 4.0–10.5)
nRBC: 0 % (ref 0.0–0.2)

## 2020-03-31 LAB — GLUCOSE, CAPILLARY
Glucose-Capillary: 122 mg/dL — ABNORMAL HIGH (ref 70–99)
Glucose-Capillary: 49 mg/dL — ABNORMAL LOW (ref 70–99)
Glucose-Capillary: 56 mg/dL — ABNORMAL LOW (ref 70–99)
Glucose-Capillary: 69 mg/dL — ABNORMAL LOW (ref 70–99)
Glucose-Capillary: 76 mg/dL (ref 70–99)
Glucose-Capillary: 80 mg/dL (ref 70–99)

## 2020-03-31 LAB — CALCIUM, IONIZED: Calcium, Ionized, Serum: 4.5 mg/dL (ref 4.5–5.6)

## 2020-03-31 LAB — MAGNESIUM: Magnesium: 1.6 mg/dL — ABNORMAL LOW (ref 1.7–2.4)

## 2020-03-31 IMAGING — CR DG CHEST 2V
2 series · 2 of 2 positions shown · non-contrast
Comparison: Portable chest [DATE] and earlier.

CLINICAL DATA: 74-year-old male postoperative day 6 status post
CABG.

EXAM:
CHEST - 2 VIEW

[chest pa]
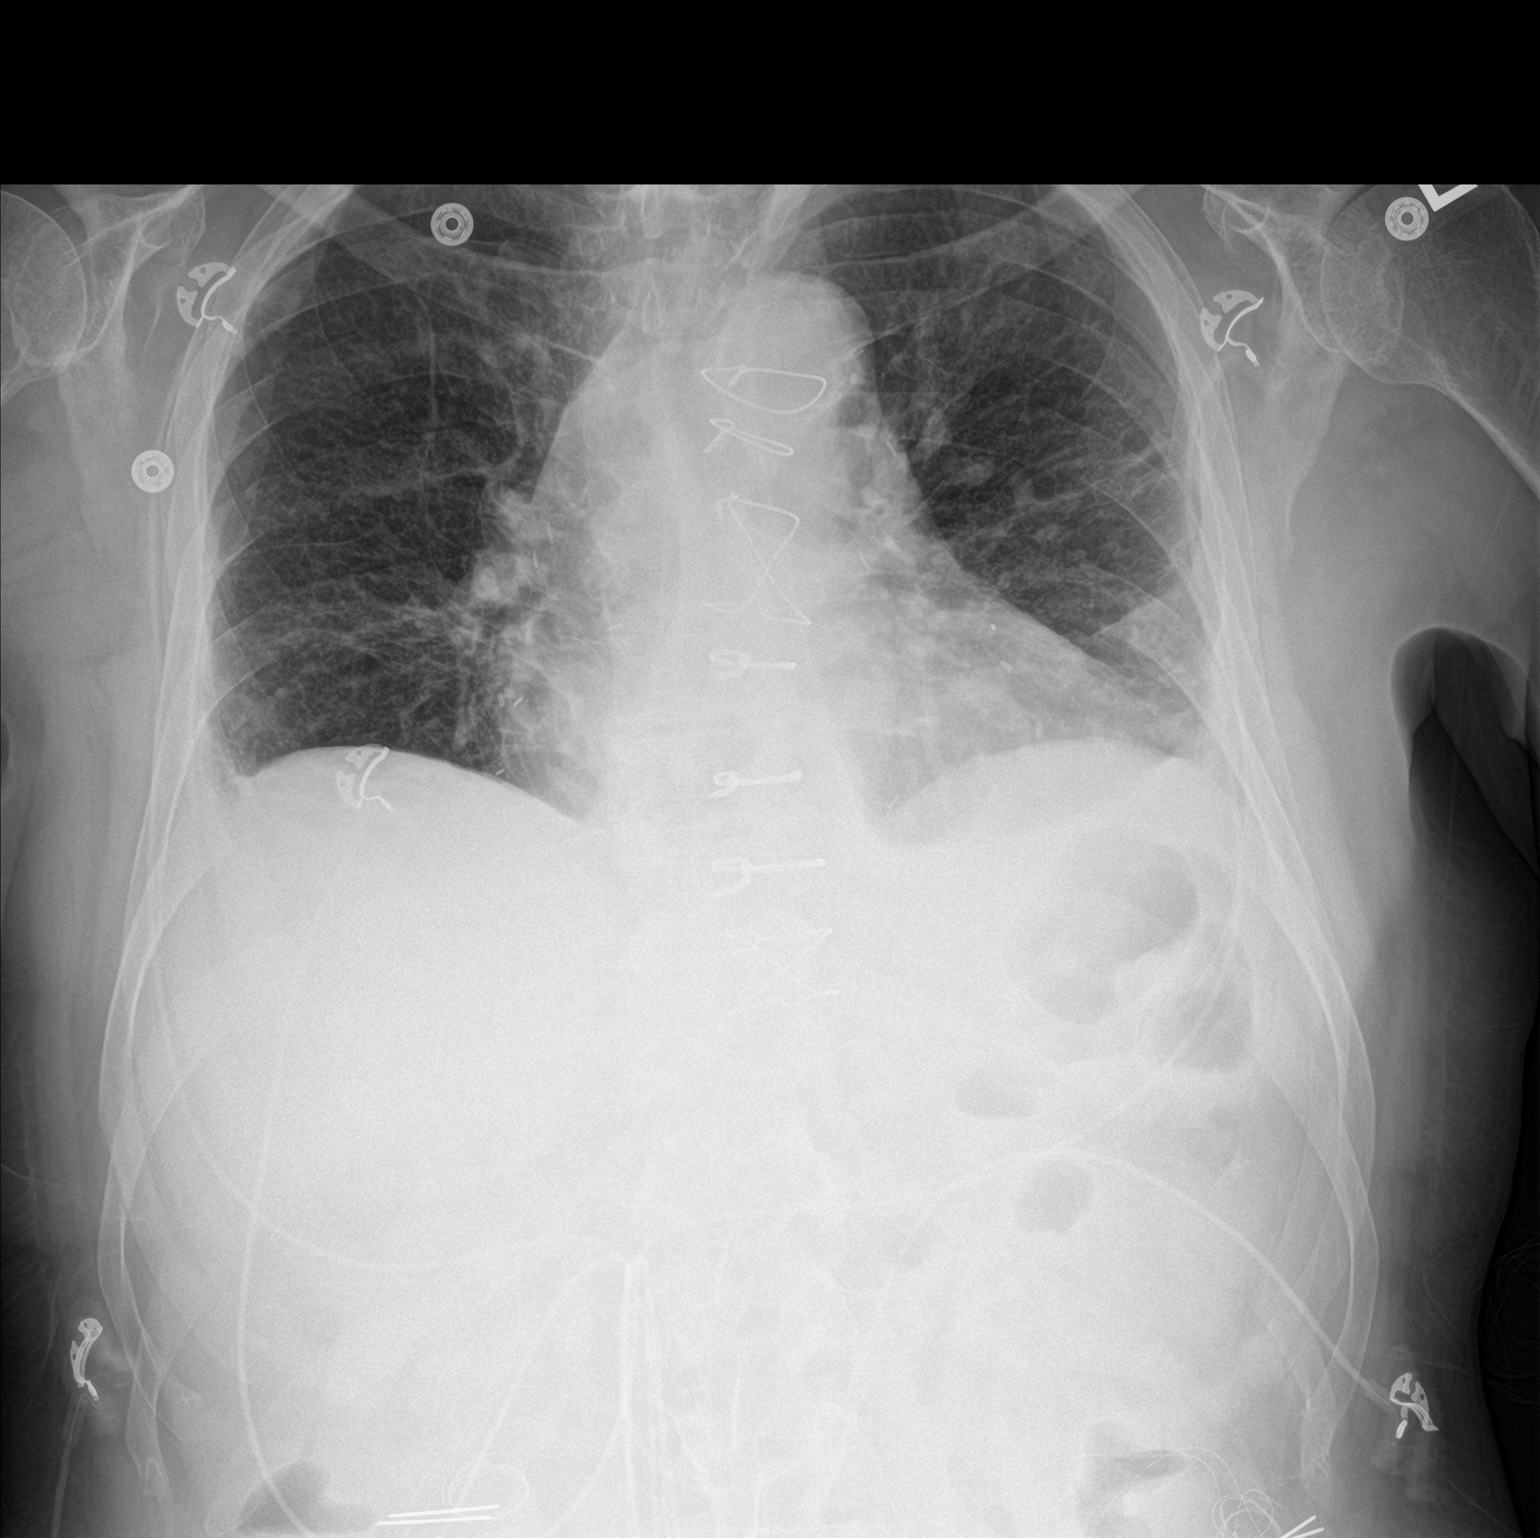

[chest lat]
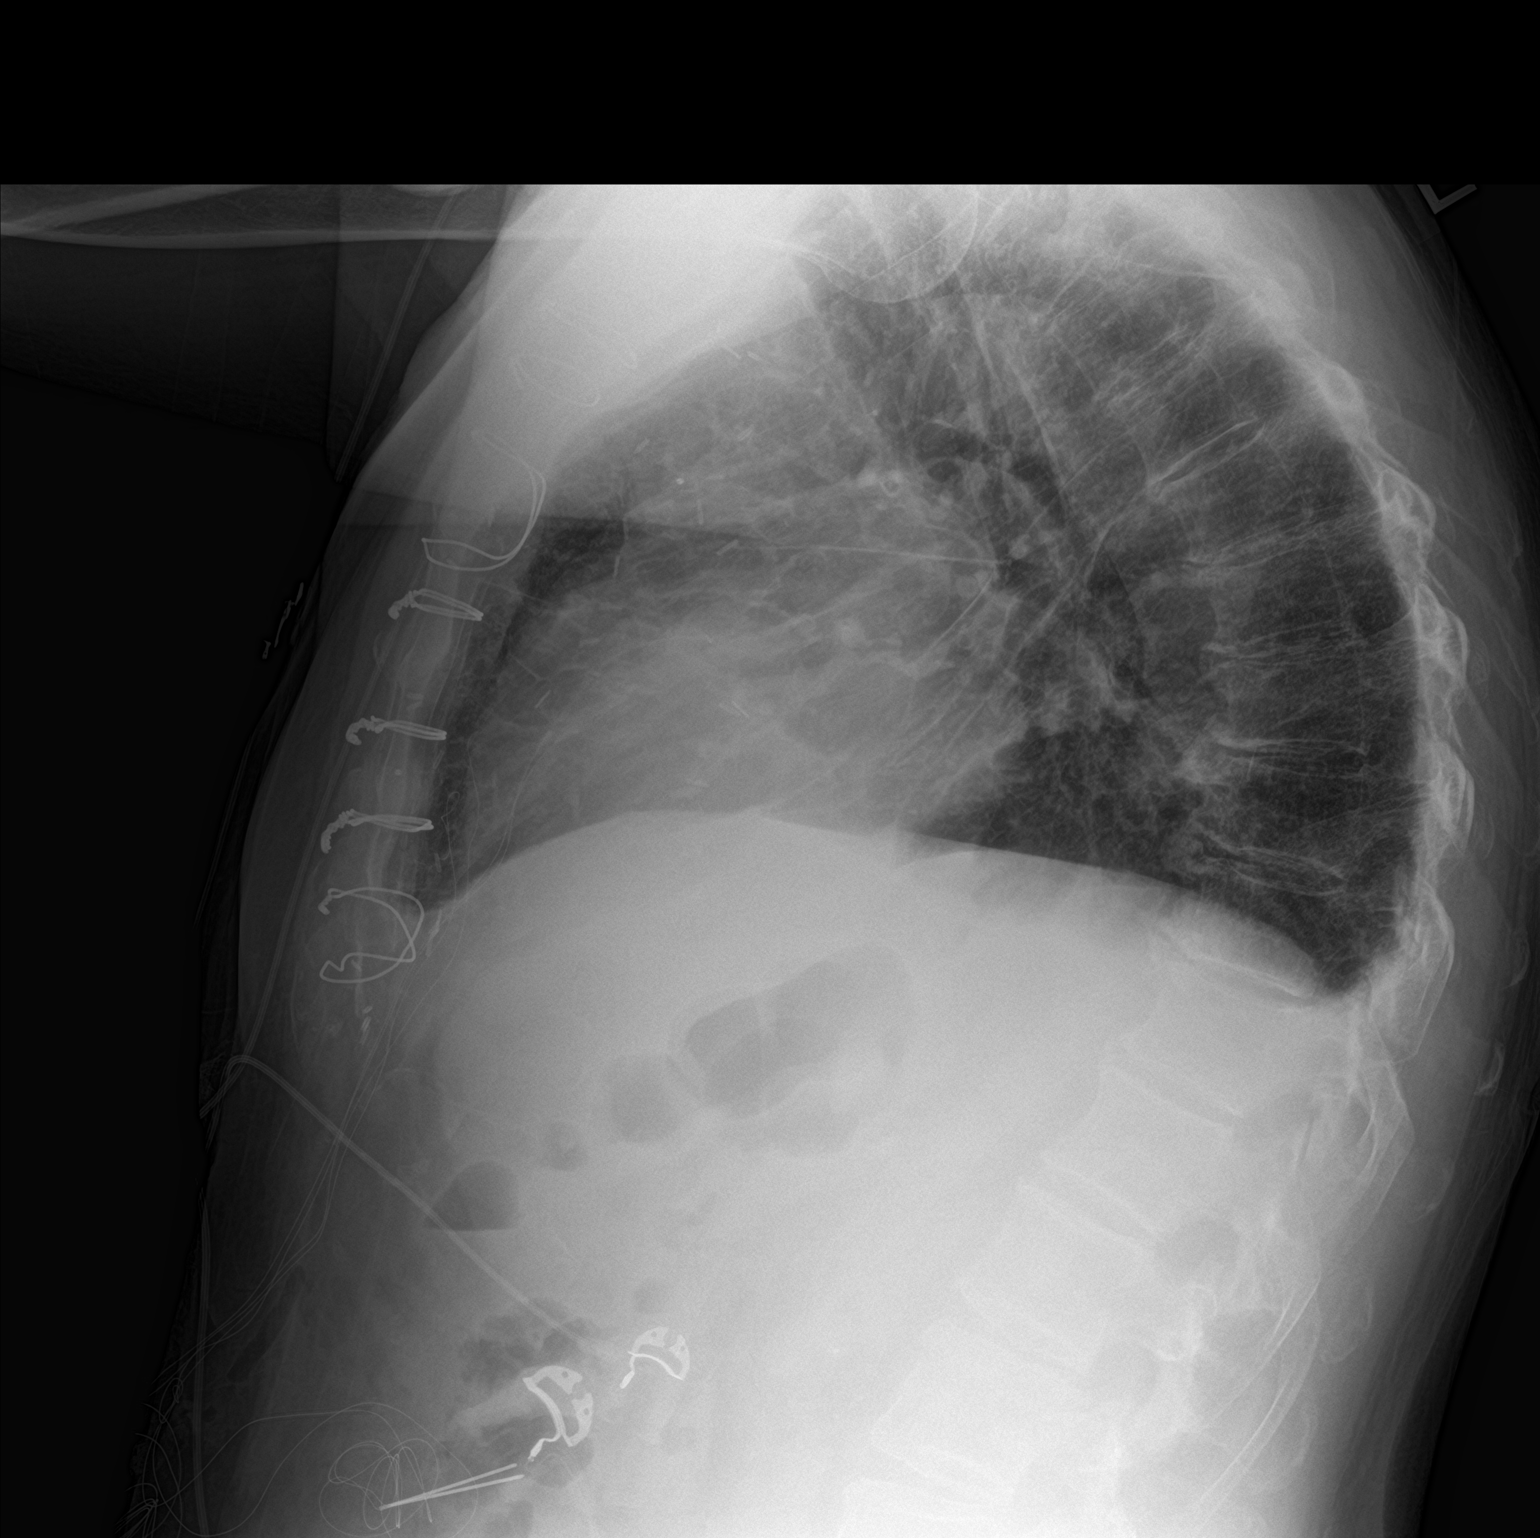

[2 of 2 positions shown; findings below may reference images not displayed]

FINDINGS: PA and lateral views today. Right IJ introducer sheath removed.
Epicardial pacer wires remain in place. Stable cardiac size and
mediastinal contours. Mildly low lung volumes. No pneumothorax.
Small bilateral layering pleural effusions. Mild perihilar
atelectasis. No acute pulmonary edema.

Sternotomy. No acute osseous abnormality identified. Negative
visible bowel gas pattern.
IMPRESSION: 1. Right IJ introducer sheath removed.
2. Small bilateral pleural effusions and mild atelectasis.

## 2020-03-31 MED ORDER — MAGNESIUM SULFATE 2 GM/50ML IV SOLN
2.0000 g | Freq: Once | INTRAVENOUS | Status: AC
  Administered 2020-03-31: 2 g via INTRAVENOUS
  Filled 2020-03-31: qty 50

## 2020-03-31 MED ORDER — LOPERAMIDE HCL 2 MG PO CAPS
2.0000 mg | ORAL_CAPSULE | ORAL | Status: DC | PRN
  Administered 2020-03-31 (×2): 2 mg via ORAL
  Filled 2020-03-31 (×3): qty 1

## 2020-03-31 MED ORDER — LISINOPRIL 5 MG PO TABS
5.0000 mg | ORAL_TABLET | Freq: Every day | ORAL | Status: DC
  Administered 2020-03-31 – 2020-04-02 (×2): 5 mg via ORAL
  Filled 2020-03-31 (×3): qty 1

## 2020-03-31 MED ORDER — ASPIRIN 325 MG PO TBEC: 325.0000 mg | DELAYED_RELEASE_TABLET | Freq: Every day | ORAL | 0 refills | Status: DC

## 2020-03-31 MED ORDER — HYDROCODONE-HOMATROPINE 5-1.5 MG/5ML PO SYRP: 5.0000 mL | ORAL_SOLUTION | ORAL | Status: DC | PRN

## 2020-03-31 MED ORDER — POTASSIUM CHLORIDE 20 MEQ PO PACK
40.0000 meq | PACK | Freq: Two times a day (BID) | ORAL | Status: DC
  Administered 2020-03-31 – 2020-04-02 (×5): 40 meq via ORAL
  Filled 2020-03-31 (×5): qty 2

## 2020-03-31 NOTE — Progress Notes (Signed)
[address]       [place] [address]             [phone]      6 Days Post-Op Procedure(s) (LRB): CORONARY ARTERY BYPASS GRAFTING (CABG), ON PUMP, TIMES FOUR, USING BILATERAL INTERNAL MAMMARY ARTERIES AND LEFT RADIAL ARTERY (N/A) LEFT RADIAL ARTERY HARVEST (Left) TRANSESOPHAGEAL ECHOCARDIOGRAM (TEE) (N/A) INDOCYANINE GREEN FLUORESCENCE IMAGING (ICG) (N/A)   Subjective:  Patient states he is not doing well.  He states that his medications are all screwed up.  He is tired as he didn't sleep at all last night.  He has had constant diarrhea all evening, being so bad at one point he wasn't able to get up out of the bed.  He would like to rest and be left alone but states he knows this wont happen.  Also complains of cough that keeps him up at night and medications he has been given aren't helping.    Objective: Vital signs in last 24 hours: Temp:  [97.7 F (36.5 C)-97.8 F (36.6 C)] 97.7 F (36.5 C) (03/08 0403) Pulse Rate:  [80-104] 90 (03/08 0403) Cardiac Rhythm: Ventricular tachycardia (03/08 0338) Resp:  [18-26] 20 (03/08 0403) BP: (89-140)/(69-94) 140/94 (03/08 0403) SpO2:  [95 %-100 %] 95 % (03/08 0403) Weight:  [99.3 kg] 99.3 kg (03/08 0516)  Intake/Output from previous day: 03/07 0701 - 03/08 0700 In: 500 [P.O.:500] Out: 1551 [Urine:1550; Stool:1]  General appearance: alert, cooperative and no distress Heart: regular rate and rhythm Lungs: clear to auscultation bilaterally Abdomen: soft, non-tender; bowel sounds normal; no masses,  no organomegaly Extremities: edema trace Wound: clean and dry  Lab Results: Recent Labs    03/30/20 0419 03/31/20 0156  WBC 9.1 10.6*  HGB 8.2* 9.3*  HCT 24.8* 28.2*  PLT 214 312   BMET:  Recent Labs    03/29/20 0348 03/30/20 0419  NA 136 139  K 3.3* 2.9*  CL 105 113*  CO2 23 19*  GLUCOSE 79 57*  BUN 14 13  CREATININE 0.83 0.68  CALCIUM 7.5* 6.2*    PT/INR: No results for input(s): LABPROT,  INR in the last 72 hours. ABG    Component Value Date/Time   PHART 7.356 03/25/2020 2042   HCO3 17.9 (L) 03/25/2020 2042   TCO2 19 (L) 03/25/2020 2042   ACIDBASEDEF 7.0 (H) 03/25/2020 2042   O2SAT 56.5 03/30/2020 1048   CBG (last 3)  Recent Labs    03/30/20 1716 03/30/20 1944 03/31/20 0604  GLUCAP 88 123* 76    Assessment/Plan: S/P Procedure(s) (LRB): CORONARY ARTERY BYPASS GRAFTING (CABG), ON PUMP, TIMES FOUR, USING BILATERAL INTERNAL MAMMARY ARTERIES AND LEFT RADIAL ARTERY (N/A) LEFT RADIAL ARTERY HARVEST (Left) TRANSESOPHAGEAL ECHOCARDIOGRAM (TEE) (N/A) INDOCYANINE GREEN FLUORESCENCE IMAGING (ICG) (N/A)  1. CV- Sinus Tachycardia, hypertensive at times- continue Coreg, Isordil for radial artery, amiodarone, will add home Lisinopril at reduced dose 2. Pulm- CXR with trace left pleural effusion, cough as expected post operatively, mild.Lavella Lemons, Mucinex ordered w/o relief... will add Hycodan syrup prn 3. Renal- creatinine had been stable, will stop lasix with several hours of watery diarrhea, will plan to restart low dose once bowel issues resolve 4. Hypokalemia- severe K was at 2.9 yesterday, suspect wont have improved much with diarrhea overnight, will give 80 meq today 5. GI- diarrhea, persistent, stop all stool softeners, immodium prn 6. DM- sugars stable, continue insulin for now, will restart Metformin at discharge 7. Deconditioning- mild, DME orders placed, will  get PT consult to see if additional discharge needs 8. Newly diagnosed B- Cell Lymphoma- will need further workup post hospital discharge 9. Dispo- patient stable, will add Lisinopril for additional BP control, d/c all stool softeners due to diarrhea, supplement K for hypokalemia, spoke with patient at length over his medication concerns and explained the reasoning behind current medication regimen, d/c EPW today, repeat labs in AM, continue current care   LOS: 10 days    Ellwood Handler, PA-C 03/31/2020

## 2020-03-31 NOTE — Progress Notes (Signed)
Pt experiencing hypolycemic with CBG=49. Pt asymptomatic. 8oz of apple juice given. Rechecked CBG=80

## 2020-03-31 NOTE — Progress Notes (Signed)
Inpatient Diabetes Program Recommendations  AACE/ADA: New Consensus Statement on Inpatient Glycemic Control   Target Ranges:  Prepandial:   less than 140 mg/dL      Peak postprandial:   less than 180 mg/dL (1-2 hours)      Critically ill patients:  140 - 180 mg/dL   Results for GROVE, DEFINA" (MRN [id number]) as of 03/31/2020 10:38  Ref. Range 03/30/2020 06:36 03/30/2020 07:02 03/30/2020 08:22 03/30/2020 11:17 03/30/2020 16:16 03/30/2020 17:16 03/30/2020 19:44 03/31/2020 06:04  Glucose-Capillary Latest Ref Range: 70 - 99 mg/dL 61 (L) 71 159 (H) 129 (H) 69 (L) 88 123 (H) 76   Review of Glycemic Control  Diabetes history: DM2 Outpatient Diabetes medications: Metformin 1000 mg BID, Actos 30 mg daily Current orders for Inpatient glycemic control: Levemir 20 units BID, Novolog 0-15 units TID with meals, Novolog 0-5 units QHS  Inpatient Diabetes Program Recommendations:    Insulin: Fasting glucose 61 mg/dl at 6:36 am on 03/30/20 and 76 mg/dl today. Please consider decreasing Leveimr to 16 units BID.  Thanks, Barnie Alderman, RN, MSN, CDE Diabetes Coordinator Inpatient Diabetes Program [phone] (Team Pager from 8am to 5pm)

## 2020-03-31 NOTE — Care Management Important Message (Signed)
Important Message  Patient Details  Name: Roy [name] MRN: [id number] Date of Birth: Dec 07, 1945   Medicare Important Message Given:  Yes     Shelda Altes 03/31/2020, 12:15 PM

## 2020-03-31 NOTE — Evaluation (Signed)
Physical Therapy Evaluation Patient Details Name: Roy [name] MRN: [id number] DOB: Feb 25, 1945 Today's Date: 03/31/2020   History of Present Illness  Pt adm 2/26 for acute SOB. Pt found to have acute heart failure and NSTEMI. Pt underwent CABG x 4 on 3/2. PMH - testicular CA, HTN, DM, cardiomyopathy, recurrent syncope.  Clinical Impression  Pt doing well with mobility and should be able to return home with wife. Will likely only see one more time for finalizing equipment needs and possibly negotiating a stair. Pt reports his brother in law has some equipment at home that he can use. Likely he has a shower seat and rollator that pt can use.     Follow Up Recommendations No PT follow up    Equipment Recommendations  Other (comment) (To be determined - pt is finding out what equipment he can borrow from brother in law)    Recommendations for Other Services       Precautions / Restrictions Precautions Precautions: Sternal      Mobility  Bed Mobility Overal bed mobility: Modified Independent             General bed mobility comments: Good technique following sternal precautions    Transfers Overall transfer level: Needs assistance Equipment used: 4-wheeled walker Transfers: Sit to/from Stand Sit to Stand: Supervision         General transfer comment: Verbal cues for hand placement  Ambulation/Gait Ambulation/Gait assistance: Supervision Gait Distance (Feet): 450 Feet Assistive device: 4-wheeled walker Gait Pattern/deviations: Step-through pattern;Decreased stride length;Trunk flexed Gait velocity: decr Gait velocity interpretation: 1.31 - 2.62 ft/sec, indicative of limited community ambulator General Gait Details: Verbal cues to stand more erect and look up  Stairs            Wheelchair Mobility    Modified Rankin (Stroke Patients Only)       Balance Overall balance assessment: Mild deficits observed, not formally tested                                            Pertinent Vitals/Pain Pain Assessment: No/denies pain    Home Living Family/patient expects to be discharged to:: Private residence Living Arrangements: Spouse/significant other Available Help at Discharge: Family;Available 24 hours/day Type of Home: House Home Access: Stairs to enter   [employer] of Steps: 1 Home Layout: One level   Additional Comments: Pt's brother in law has some equipment available - probably a rollator and shower seat and possibly more.    Prior Function Level of Independence: Independent               Hand Dominance        Extremity/Trunk Assessment   Upper Extremity Assessment Upper Extremity Assessment: Overall WFL for tasks assessed    Lower Extremity Assessment Lower Extremity Assessment: Generalized weakness    Cervical / Trunk Assessment Cervical / Trunk Assessment: Kyphotic  Communication   Communication: No difficulties (hearing aids)  Cognition Arousal/Alertness: Awake/alert Behavior During Therapy: WFL for tasks assessed/performed Overall Cognitive Status: Within Functional Limits for tasks assessed                                        General Comments      Exercises     Assessment/Plan    PT Assessment  Patient needs continued PT services  PT Problem List Decreased strength;Decreased mobility;Decreased knowledge of use of DME       PT Treatment Interventions DME instruction;Gait training;Stair training;Functional mobility training;Therapeutic activities;Therapeutic exercise;Patient/family education    PT Goals (Current goals can be found in the Care Plan section)  Acute Rehab PT Goals Patient Stated Goal: return to amb without assistive device PT Goal Formulation: With patient Time For Goal Achievement: 04/07/20 Potential to Achieve Goals: Good    Frequency Min 2X/week   Barriers to discharge        Co-evaluation               AM-PAC PT "6  Clicks" Mobility  Outcome Measure Help needed turning from your back to your side while in a flat bed without using bedrails?: None Help needed moving from lying on your back to sitting on the side of a flat bed without using bedrails?: None Help needed moving to and from a bed to a chair (including a wheelchair)?: A Little Help needed standing up from a chair using your arms (e.g., wheelchair or bedside chair)?: A Little Help needed to walk in hospital room?: A Little Help needed climbing 3-5 steps with a railing? : A Little 6 Click Score: 20    End of Session   Activity Tolerance: Patient tolerated treatment well Patient left: in chair;with call bell/phone within reach Nurse Communication: Mobility status PT Visit Diagnosis: Other abnormalities of gait and mobility (R26.89)    Time: 2637-8588 PT Time Calculation (min) (ACUTE ONLY): 22 min   Charges:   PT Evaluation $PT Eval Moderate Complexity: 1 Mod          [place] Pager [phone] Office [place] 03/31/2020, 4:36 PM

## 2020-03-31 NOTE — Progress Notes (Signed)
CARDIAC REHAB PHASE I   PRE:  Rate/Rhythm: 101 ST    BP: lying 92/76    SaO2: 96 RA  MODE:  Ambulation: 470 ft   POST:  Rate/Rhythm: 121 ST    BP: sitting 123/76     SaO2: 97 RA  Pt with many c/o but willing to walk. Able to move out of bed independently, needed min assist to stand due to posterior lean. Steady in hall with RW although struggled to relax shoulders. No c/o during walk, to recliner. In recliner, pt visibly shivering. Sts he is not significantly cold, just extremely tired from lack of sleep, diarrhea, and walking. VSS. Encouraged IS, ordered broth for lunch at his request.  [place], ACSM 03/31/2020 11:46 AM

## 2020-03-31 NOTE — Progress Notes (Signed)
Mobility Specialist: Progress Note   03/31/20 1307  Mobility  Activity Transferred:  Chair to bed  Level of Assistance Contact guard assist, steadying assist  Assistive Device None  Distance Ambulated (ft) 2 ft  Mobility Response Tolerated well  Mobility performed by Mobility specialist   Pt requesting to transfer from the chair to the bed. Pt with slight posterior lean when pivoting to the bed from the chair. Will f/u later today for mobility.   [facility] Kassadi Presswood Mobility Specialist Mobility Specialist Phone: [phone]

## 2020-03-31 NOTE — Progress Notes (Signed)
Mobility Specialist: Progress Note   03/31/20 1818  Mobility  Activity Ambulated in hall  Level of Assistance Minimal assist, patient does 75% or more  Assistive Device Front wheel walker  Distance Ambulated (ft) 470 ft  Mobility Response Tolerated well  Mobility performed by Mobility specialist  $Mobility charge 1 Mobility   Pre-Mobility: 116 HR Post-Mobility: 126 HR  Pt asx during ambulation. Pt sitting EOB for dinner per request with call bell at his side, RN notified.   [facility] Sacoya Mcgourty Mobility Specialist Mobility Specialist Phone: [phone]

## 2020-03-31 NOTE — Progress Notes (Signed)
PT Cancellation Note  Patient Details Name: Roy [name] MRN: [id number] DOB: [birth year]   Cancelled Treatment:    Reason Eval/Treat Not Completed: Other (comment). Pt with pacing wires pulled and will be on bedrest x 1 hour. Will try again later.    [place] 03/31/2020, 1:29 PM [place] Pager [phone] Office [phone]

## 2020-03-31 NOTE — Progress Notes (Signed)
PT Cancellation Note  Patient Details Name: Roy [name] MRN: [id number] DOB: Feb 26, 1945   Cancelled Treatment:    Reason Eval/Treat Not Completed: Other (comment). Pt with cardiac rehab currently. Will see after lunch.    [name] [facility] 03/31/2020, 11:16 AM [place] Pager [phone] Office [phone]

## 2020-03-31 NOTE — Progress Notes (Signed)
D/c pacing wires per order. Pacing wires ends intact. Pt tolerated fair. VSS.   Lavenia Atlas, RN

## 2020-04-01 LAB — BASIC METABOLIC PANEL
Anion gap: 10 (ref 5–15)
BUN: 10 mg/dL (ref 8–23)
CO2: 20 mmol/L — ABNORMAL LOW (ref 22–32)
Calcium: 7.9 mg/dL — ABNORMAL LOW (ref 8.9–10.3)
Chloride: 103 mmol/L (ref 98–111)
Creatinine, Ser: 1.17 mg/dL (ref 0.61–1.24)
GFR, Estimated: >60 mL/min (ref >60)
Glucose, Bld: 117 mg/dL — ABNORMAL HIGH (ref 70–99)
Potassium: 4.6 mmol/L (ref 3.5–5.1)
Sodium: 133 mmol/L — ABNORMAL LOW (ref 135–145)

## 2020-04-01 LAB — GLUCOSE, CAPILLARY
Glucose-Capillary: 111 mg/dL — ABNORMAL HIGH (ref 70–99)
Glucose-Capillary: 119 mg/dL — ABNORMAL HIGH (ref 70–99)
Glucose-Capillary: 122 mg/dL — ABNORMAL HIGH (ref 70–99)
Glucose-Capillary: 124 mg/dL — ABNORMAL HIGH (ref 70–99)
Glucose-Capillary: 153 mg/dL — ABNORMAL HIGH (ref 70–99)

## 2020-04-01 MED ORDER — LISINOPRIL 5 MG PO TABS: 5.0000 mg | ORAL_TABLET | Freq: Every day | ORAL | 3 refills | Status: DC

## 2020-04-01 MED ORDER — FUROSEMIDE 40 MG PO TABS
40.0000 mg | ORAL_TABLET | Freq: Every day | ORAL | 0 refills | Status: DC
Start: 2020-04-02 — End: 2020-04-13

## 2020-04-01 MED ORDER — ISOSORBIDE DINITRATE 10 MG PO TABS: 10.0000 mg | ORAL_TABLET | Freq: Three times a day (TID) | ORAL | 0 refills | Status: DC

## 2020-04-01 MED ORDER — FUROSEMIDE 40 MG PO TABS: 40.0000 mg | ORAL_TABLET | Freq: Every day | ORAL | 0 refills | Status: DC

## 2020-04-01 MED ORDER — AMIODARONE HCL 200 MG PO TABS: 400.0000 mg | ORAL_TABLET | Freq: Two times a day (BID) | ORAL | 1 refills | Status: DC

## 2020-04-01 MED ORDER — LOPERAMIDE HCL 2 MG PO CAPS: 2.0000 mg | ORAL_CAPSULE | ORAL | 0 refills | Status: DC | PRN

## 2020-04-01 MED ORDER — TRAMADOL HCL 50 MG PO TABS: 50.0000 mg | ORAL_TABLET | ORAL | 0 refills | Status: DC | PRN

## 2020-04-01 MED ORDER — CLOPIDOGREL BISULFATE 75 MG PO TABS: 75.0000 mg | ORAL_TABLET | Freq: Every day | ORAL | 3 refills | Status: DC

## 2020-04-01 MED ORDER — ACETAMINOPHEN 325 MG PO TABS: 650.0000 mg | ORAL_TABLET | ORAL | Status: DC | PRN

## 2020-04-01 MED ORDER — POTASSIUM CHLORIDE ER 20 MEQ PO TBCR: 10.0000 meq | EXTENDED_RELEASE_TABLET | Freq: Every day | ORAL | 0 refills | Status: DC

## 2020-04-01 MED ORDER — POTASSIUM CHLORIDE ER 20 MEQ PO TBCR: 20.0000 meq | EXTENDED_RELEASE_TABLET | Freq: Every day | ORAL | 0 refills | Status: DC

## 2020-04-01 NOTE — Progress Notes (Signed)
Mobility Specialist - Progress Note   04/01/20 1441  Mobility  Activity Refused mobility   Pt has refused twice today, will f/u as able.   Pricilla Handler Mobility Specialist Mobility Specialist Phone: [phone]

## 2020-04-01 NOTE — Progress Notes (Signed)
      [address]       [place] [address]             [phone]      7 Days Post-Op Procedure(s) (LRB): CORONARY ARTERY BYPASS GRAFTING (CABG), ON PUMP, TIMES FOUR, USING BILATERAL INTERNAL MAMMARY ARTERIES AND LEFT RADIAL ARTERY (N/A) LEFT RADIAL ARTERY HARVEST (Left) TRANSESOPHAGEAL ECHOCARDIOGRAM (TEE) (N/A) INDOCYANINE GREEN FLUORESCENCE IMAGING (ICG) (N/A)   Subjective:  No new complaints.  Feeling better today.  Has had no further diarrhea.  He states he may already have a walker and shower chair at his brother in laws house.  He wants to have Urology evaluate his testicle from previous surgery as he missed his post operative visit.  I explained to the patient this can be arranged on an outpatient basis and they should contact the office to arrange this from their convenience.  Objective: Vital signs in last 24 hours: Temp:  [97.8 F (36.6 C)-99.1 F (37.3 C)] 98.7 F (37.1 C) (03/09 0401) Pulse Rate:  [77-114] 100 (03/09 0401) Cardiac Rhythm: Sinus tachycardia (03/08 1902) Resp:  [19-20] 19 (03/09 0401) BP: (92-119)/(61-87) 100/61 (03/09 0401) SpO2:  [95 %-98 %] 98 % (03/09 0401) Weight:  [99.4 kg] 99.4 kg (03/09 0500)  Intake/Output from previous day: 03/08 0701 - 03/09 0700 In: -  Out: 500 [Urine:500]  General appearance: alert, cooperative and no distress Heart: regular rate and rhythm and mild Lungs: clear to auscultation bilaterally Abdomen: soft, non-tender; bowel sounds normal; no masses,  no organomegaly Extremities: edema trace Wound: C/D/I, staples in left radial site  Lab Results: Recent Labs    03/30/20 0419 03/31/20 0156  WBC 9.1 10.6*  HGB 8.2* 9.3*  HCT 24.8* 28.2*  PLT 214 312   BMET:  Recent Labs    03/30/20 0419 04/01/20 0024  NA 139 133*  K 2.9* 4.6  CL 113* 103  CO2 19* 20*  GLUCOSE 57* 117*  BUN 13 10  CREATININE 0.68 1.17  CALCIUM 6.2* 7.9*    PT/INR: No results for input(s): LABPROT, INR in the last  72 hours. ABG    Component Value Date/Time   PHART 7.356 03/25/2020 2042   HCO3 17.9 (L) 03/25/2020 2042   TCO2 19 (L) 03/25/2020 2042   ACIDBASEDEF 7.0 (H) 03/25/2020 2042   O2SAT 56.5 03/30/2020 1048   CBG (last 3)  Recent Labs    03/31/20 2340 04/01/20 0045 04/01/20 0628  GLUCAP 69* 122* 111*    Assessment/Plan: S/P Procedure(s) (LRB): CORONARY ARTERY BYPASS GRAFTING (CABG), ON PUMP, TIMES FOUR, USING BILATERAL INTERNAL MAMMARY ARTERIES AND LEFT RADIAL ARTERY (N/A) LEFT RADIAL ARTERY HARVEST (Left) TRANSESOPHAGEAL ECHOCARDIOGRAM (TEE) (N/A) INDOCYANINE GREEN FLUORESCENCE IMAGING (ICG) (N/A)  1. CV- Sinus tach, stable- continue Lopressor, Amiodarone, Isordil, Lisinopril 2. Pulm- no acute issues, continue IS 3. Renal-creatinine remains stable, weight is slightly elevated, will restart Lasix on Saturday to allow him to fully recover from bouts of diarrhea 4. Hypokalemia- resolved, up to 4.6,. will supplement with use of Lasix only 5. GI- diarrhea resolved 6. B Cell Lymphoma- s/p orchiectomy surgery, patient will need to reschedule post operative follow up at their convenience 7. DM- sugars controlled will resume home medications 8. Dispo- patient medically stable for discharge, will speak with his wife to see if she is ready for him to be discharge, home later today vs. Tomorrow AM   LOS: 11 days    Ellwood Handler, PA-C 04/01/2020

## 2020-04-01 NOTE — TOC Transition Note (Signed)
Transition of Care (TOC) - CM/SW Discharge Note [name] RN, BSN Transitions of Care Unit 4E- RN Case Manager See Treatment Team for direct phone #    Patient Details  Name: Roy [name] MRN: [id number] Date of Birth: Mar 24, 1945  [facility]) CM/SW Contact:  Dawayne Patricia, RN Phone Number: 04/01/2020, 10:16 AM   Clinical Narrative:    Pt stable for transition home today, noted DME RW, shower chair ordered- CM spoke with pt at bedside to discuss DME needs. Per pt he/family have made arrangements for needed DME and it will be available at his home this afternoon.  No further needs noted, pt reports that he has no further needs. Pt states he will have transportation home.    Final next level of care: Home/Self Care Barriers to Discharge: Barriers Resolved   Patient Goals and CMS Choice Patient states their goals for this hospitalization and ongoing recovery are:: to return home   Choice offered to / list presented to : NA  Discharge Placement                  Home      Discharge Plan and Services In-house Referral: NA Discharge Planning Services: CM Consult Post Acute Care Choice: NA          DME Arranged: Walker rolling,Shower stool,Patient refused services (Pt states he has made arrangements for DME) DME Agency: NA       HH Arranged: NA HH Agency: NA        Social Determinants of Health (SDOH) Interventions     Readmission Risk Interventions Readmission Risk Prevention Plan 04/01/2020 03/30/2020  Transportation Screening - Complete  PCP or Specialist Appt within 3-5 Days Complete -  HRI or [place] - Complete  Social Work Consult for [place] Planning/Counseling - Complete  Palliative Care Screening - Not Applicable  Medication Review Press photographer) - Complete  Some recent data might be hidden

## 2020-04-01 NOTE — Progress Notes (Signed)
[facility] will sign off.   Medication Recommendations:   - Amiodarone 400 mg daily until 03/13, then 200 mg daily - Atorvastatin 40 mg daily - Carvedilol 6.25 mg twice daily - Isosorbide dinitrate 10 mg three times daily - Lisinopril 5 mg daily - ASA 325 mg daily x 3 months, then 81 mg daily - Clopidogrel 75 mg daily (presented with ACS) - Furosemide 40 mg daily, start tomorrow  - KCl 20 mEq daily, start tomorrow Other recommendations (labs, testing, etc):  BMET at f/u Follow up as an outpatient:  Scheduled 03/18

## 2020-04-01 NOTE — Progress Notes (Signed)
CARDIAC REHAB PHASE I   PRE:  Rate/Rhythm: 101 ST    BP: sitting 93/67    SaO2: 97 RA  MODE:  Ambulation: 330 ft   POST:  Rate/Rhythm: 106 ST    BP: sitting 97/62     SaO2: 98 RA  Pt felt fatigued and "washed out" after walking with RW 160 ft. Had to sit in hall. Felt better after rest and walked back to room but did not go his normal full distance. BP still in 90s. HR more controlled this pm (113 ST at rest this am). To recliner, legs elevated.  Discussed ed with pt and wife with good reception. Discussed IS, sternal precautions, diet including low sodium, exercise, daily wts (gave HF booklet), and CRPII. Pt voiced understanding and requests his referral be sent to [facility]. Encouraged x2 more walks today and more IS. [id number]  Darrick Meigs CES, ACSM 04/01/2020 2:23 PM

## 2020-04-02 ENCOUNTER — Ambulatory Visit: Payer: Medicare HMO | Admitting: Hematology

## 2020-04-02 LAB — GLUCOSE, CAPILLARY
Glucose-Capillary: 62 mg/dL — ABNORMAL LOW (ref 70–99)
Glucose-Capillary: 96 mg/dL (ref 70–99)

## 2020-04-02 NOTE — Progress Notes (Signed)
      [address]       [place] [address]             [phone]      8 Days Post-Op Procedure(s) (LRB): CORONARY ARTERY BYPASS GRAFTING (CABG), ON PUMP, TIMES FOUR, USING BILATERAL INTERNAL MAMMARY ARTERIES AND LEFT RADIAL ARTERY (N/A) LEFT RADIAL ARTERY HARVEST (Left) TRANSESOPHAGEAL ECHOCARDIOGRAM (TEE) (N/A) INDOCYANINE GREEN FLUORESCENCE IMAGING (ICG) (N/A)   Subjective:  No new complaints.  Didn't sleep well last night.  Has received walker and shower chair from family and its all set up at home.  He does worry about getting up and down to the bathroom at home.  I encouraged patient to take urinal home with him to use on days he may not feel all that well.  + ambulation   + BM  Objective: Vital signs in last 24 hours: Temp:  [97.7 F (36.5 C)-99.1 F (37.3 C)] 97.7 F (36.5 C) (03/10 0407) Pulse Rate:  [74-100] 74 (03/10 0407) Cardiac Rhythm: Heart block (03/09 2004) Resp:  [15-20] 18 (03/10 0407) BP: (94-120)/(64-84) 95/64 (03/10 0407) SpO2:  [97 %-98 %] 97 % (03/10 0407)  Intake/Output from previous day: 03/09 0701 - 03/10 0700 In: -  Out: 100 [Urine:100]  General appearance: alert, cooperative and no distress Heart: regular rate and rhythm Lungs: clear to auscultation bilaterally Abdomen: soft, non-tender; bowel sounds normal; no masses,  no organomegaly Extremities: edema trace Wound: clean and dry  Lab Results: Recent Labs    03/31/20 0156  WBC 10.6*  HGB 9.3*  HCT 28.2*  PLT 312   BMET:  Recent Labs    04/01/20 0024  NA 133*  K 4.6  CL 103  CO2 20*  GLUCOSE 117*  BUN 10  CREATININE 1.17  CALCIUM 7.9*    PT/INR: No results for input(s): LABPROT, INR in the last 72 hours. ABG    Component Value Date/Time   PHART 7.356 03/25/2020 2042   HCO3 17.9 (L) 03/25/2020 2042   TCO2 19 (L) 03/25/2020 2042   ACIDBASEDEF 7.0 (H) 03/25/2020 2042   O2SAT 56.5 03/30/2020 1048   CBG (last 3)  Recent Labs    04/01/20 2124  04/02/20 0622 04/02/20 0646  GLUCAP 153* 62* 96    Assessment/Plan: S/P Procedure(s) (LRB): CORONARY ARTERY BYPASS GRAFTING (CABG), ON PUMP, TIMES FOUR, USING BILATERAL INTERNAL MAMMARY ARTERIES AND LEFT RADIAL ARTERY (N/A) LEFT RADIAL ARTERY HARVEST (Left) TRANSESOPHAGEAL ECHOCARDIOGRAM (TEE) (N/A) INDOCYANINE GREEN FLUORESCENCE IMAGING (ICG) (N/A)  1. CV- remains hemodynamically stable in NSR- on Amiodarone, Lopressor, Lisinopril, Isordil 2. Pulm-no acute issues, off oxygen, continue IS 3. Renal- weight has remained stable, will plan to resume lasix today 4. B Cell Lymphoma- follow up with [facility] 5. DM- sugars are well controlled 6. Dispo-patient stable, needed an additional night to get everything ready at home.  Will d/c home today   LOS: 12 days    Ellwood Handler, PA-C 04/02/2020

## 2020-04-02 NOTE — Progress Notes (Addendum)
Physical Therapy Treatment Patient Details Name: Roy [name] MRN: [id number] DOB: [birth year] Today's Date: 04/02/2020    History of Present Illness Pt adm 2/26 for acute SOB. Pt found to have acute heart failure and NSTEMI. Pt underwent CABG x 4 on 3/2. PMH - testicular CA, HTN, DM, cardiomyopathy, recurrent syncope.    PT Comments    Pt received seated up on toilet, agreeable to therapy session and with good participation and tolerance for mobility. Pt performed transfers and household distance gait task with Supervision to min guard without assistive device, pt with mild balance deficits noted not using RW but no overt LOB observed. Primary session focus on education on maintaining sternal precautions during functional mobility tasks as well as HEP review, pt given handouts for both and receptive to all instruction. Spouse present during instruction as well. Pt continues to benefit from PT services to progress toward functional mobility goals. DC recs below remain appropriate.   Follow Up Recommendations  No PT follow up     Equipment Recommendations  Other (comment) (may benefit from 3in1 but able to ambulate to bathroom this date)    Recommendations for Other Services       Precautions / Restrictions Precautions Precautions: Sternal Precaution Booklet Issued: Yes (comment) Precaution Comments: sternal precs handout given along with sternal HEP and standing exercises Restrictions Weight Bearing Restrictions: No    Mobility  Bed Mobility Overal bed mobility: Modified Independent             General bed mobility comments: on toilet when PTA arrived, given verbal/visual demo and handout for log roll sequencing    Transfers Overall transfer level: Needs assistance Equipment used: None Transfers: Sit to/from Stand Sit to Stand: Supervision         General transfer comment: Verbal cues for hand placement and visual demo to reinforce; from toilet to no AD and from no  AD to recliner chair  Ambulation/Gait Ambulation/Gait assistance: Supervision Gait Distance (Feet): 20 Feet Assistive device: None Gait Pattern/deviations: Step-through pattern;Decreased stride length;Trunk flexed Gait velocity: decr   General Gait Details: Verbal cues to stand more erect, intermittent use of furniture and no overt LOB, mildly unsteady without AD   Stairs Stairs:  (pt given verbal/visual demo for technique, pt denies need to perform stairs in home)           Wheelchair Mobility    Modified Rankin (Stroke Patients Only)       Balance Overall balance assessment: Mild deficits observed, not formally tested                                          Cognition Arousal/Alertness: Awake/alert Behavior During Therapy: WFL for tasks assessed/performed Overall Cognitive Status: Within Functional Limits for tasks assessed                                 General Comments: receptive to instruction; HoH and hearing aides donned      Exercises      General Comments General comments (skin integrity, edema, etc.): reviewed techniques to maintain sternal precs with ADL/mobility tasks and asking for help, activity pacing      Pertinent Vitals/Pain Pain Assessment: Faces Faces Pain Scale: Hurts a little bit Pain Location: incision site Pain Descriptors / Indicators: Sore Pain Intervention(s): Monitored during session;Repositioned  Home Living                      Prior Function            PT Goals (current goals can now be found in the care plan section) Acute Rehab PT Goals Patient Stated Goal: return to amb without assistive device PT Goal Formulation: With patient Time For Goal Achievement: 04/07/20 Potential to Achieve Goals: Good Progress towards PT goals: Progressing toward goals    Frequency    Min 2X/week      PT Plan Current plan remains appropriate    Co-evaluation               AM-PAC PT "6 Clicks" Mobility   Outcome Measure  Help needed turning from your back to your side while in a flat bed without using bedrails?: None Help needed moving from lying on your back to sitting on the side of a flat bed without using bedrails?: None Help needed moving to and from a bed to a chair (including a wheelchair)?: A Little Help needed standing up from a chair using your arms (e.g., wheelchair or bedside chair)?: A Little Help needed to walk in hospital room?: A Little Help needed climbing 3-5 steps with a railing? : A Little 6 Click Score: 20    End of Session   Activity Tolerance: Patient tolerated treatment well Patient left: in chair;with call bell/phone within reach;with family/visitor present Nurse Communication: Mobility status PT Visit Diagnosis: Other abnormalities of gait and mobility (R26.89)     Time: 1010-1025 PT Time Calculation (min) (ACUTE ONLY): 15 min  Charges:  $Therapeutic Activity: 8-22 mins                     Carly P., PTA Acute Rehabilitation Services Pager: [phone] Office: [place] 04/02/2020, 11:28 AM

## 2020-04-02 NOTE — Consult Note (Signed)
   [facility] Inpatient Consult   04/02/2020  Roy [name] Jun 07, 1945 [id number]  [facility] [facility] [ACO] Patient: [name] Medicare   Patient screened for hospitalization with LLOS of 12 days, transferred from ICU noted showing as high score for unplanned readmission risk.   Reviewed to assess for potential [place] Management service needs for post hospital transition.  Review of patient's medical record reveals patient's primary care provider is listed to the transition of care follow up.   Plan:  No current [facility] Care Management needs assessed after review of inpatient TOC RNCM and PA notes.  For questions contact:   Natividad Brood, RN BSN [facility] Liaison  [phone] business mobile phone Toll free office [phone]  Fax number: [phone] [email or web address]

## 2020-04-02 NOTE — Progress Notes (Signed)
Discharge instructions (including medications) discussed with and copy provided to patient/caregiver 

## 2020-04-02 NOTE — Plan of Care (Signed)
  Problem: Education: Goal: Understanding of cardiac disease, CV risk reduction, and recovery process will improve Outcome: Adequate for Discharge Goal: Understanding of medication regimen will improve Outcome: Adequate for Discharge Goal: Individualized Educational Video(s) Outcome: Adequate for Discharge

## 2020-04-03 DIAGNOSIS — R338 Other retention of urine: Secondary | ICD-10-CM | POA: Diagnosis not present

## 2020-04-05 ENCOUNTER — Telehealth: Payer: Self-pay | Admitting: Urology

## 2020-04-05 ENCOUNTER — Encounter: Payer: Self-pay | Admitting: Emergency Medicine

## 2020-04-05 ENCOUNTER — Emergency Department
Admission: EM | Admit: 2020-04-05 | Discharge: 2020-04-05 | Disposition: A | Discharge status: Home / Self Care | Payer: Medicare HMO | Source: Home / Self Care | Attending: Emergency Medicine | Admitting: Emergency Medicine

## 2020-04-05 ENCOUNTER — Other Ambulatory Visit: Payer: Self-pay

## 2020-04-05 ENCOUNTER — Emergency Department
Admission: EM | Admit: 2020-04-05 | Discharge: 2020-04-05 | Disposition: A | Discharge status: Home / Self Care | Payer: Medicare HMO | Attending: Emergency Medicine | Admitting: Emergency Medicine

## 2020-04-05 DIAGNOSIS — T839XXA Unspecified complication of genitourinary prosthetic device, implant and graft, initial encounter: Secondary | ICD-10-CM

## 2020-04-05 DIAGNOSIS — Z85828 Personal history of other malignant neoplasm of skin: Secondary | ICD-10-CM | POA: Insufficient documentation

## 2020-04-05 DIAGNOSIS — Z7982 Long term (current) use of aspirin: Secondary | ICD-10-CM | POA: Insufficient documentation

## 2020-04-05 DIAGNOSIS — Z79899 Other long term (current) drug therapy: Secondary | ICD-10-CM | POA: Insufficient documentation

## 2020-04-05 DIAGNOSIS — T83091D Other mechanical complication of indwelling urethral catheter, subsequent encounter: Secondary | ICD-10-CM | POA: Insufficient documentation

## 2020-04-05 DIAGNOSIS — Z7902 Long term (current) use of antithrombotics/antiplatelets: Secondary | ICD-10-CM | POA: Insufficient documentation

## 2020-04-05 DIAGNOSIS — Z8547 Personal history of malignant neoplasm of testis: Secondary | ICD-10-CM | POA: Diagnosis not present

## 2020-04-05 DIAGNOSIS — Z87891 Personal history of nicotine dependence: Secondary | ICD-10-CM | POA: Insufficient documentation

## 2020-04-05 DIAGNOSIS — Z7984 Long term (current) use of oral hypoglycemic drugs: Secondary | ICD-10-CM | POA: Insufficient documentation

## 2020-04-05 DIAGNOSIS — I110 Hypertensive heart disease with heart failure: Secondary | ICD-10-CM | POA: Insufficient documentation

## 2020-04-05 DIAGNOSIS — I509 Heart failure, unspecified: Secondary | ICD-10-CM | POA: Insufficient documentation

## 2020-04-05 DIAGNOSIS — T83091A Other mechanical complication of indwelling urethral catheter, initial encounter: Secondary | ICD-10-CM | POA: Diagnosis not present

## 2020-04-05 DIAGNOSIS — Z951 Presence of aortocoronary bypass graft: Secondary | ICD-10-CM | POA: Insufficient documentation

## 2020-04-05 DIAGNOSIS — E119 Type 2 diabetes mellitus without complications: Secondary | ICD-10-CM | POA: Insufficient documentation

## 2020-04-05 DIAGNOSIS — R339 Retention of urine, unspecified: Secondary | ICD-10-CM | POA: Insufficient documentation

## 2020-04-05 DIAGNOSIS — T839XXD Unspecified complication of genitourinary prosthetic device, implant and graft, subsequent encounter: Secondary | ICD-10-CM

## 2020-04-05 DIAGNOSIS — T83098A Other mechanical complication of other indwelling urethral catheter, initial encounter: Secondary | ICD-10-CM | POA: Diagnosis not present

## 2020-04-05 NOTE — ED Notes (Signed)
Stat lock on foley changed out, patient's foley has not leaked.

## 2020-04-05 NOTE — ED Provider Notes (Signed)
[facility] Emergency Department Provider Note ____________________________________________  Time seen: 1645  I have reviewed the triage vital signs and the nursing notes.  HISTORY  Chief Complaint  Leaking Foley   HPI Roy [name] is a 75 y.o. male presents to the ER today with complaint of his Foley leaking.  He reports he was in the ER earlier today to have his Foley replaced because it was leaking.  He reports they put in a 35 French catheter.  He did not have any issues after having the Foley put in, but as soon as he got home and into the garage he reports out large amount of fluid came out around the Foley and not in the bag.  He came back to the ER for reevaluation of this.  He has not noticed any leakage since that time.  He actually has seen more fluid and the leg bag than there has been.  He currently has the Foley for urinary retention.  He is on Flomax and [facility].  He did have an orchiectomy 2/16 for testicular cancer.  Past Medical History:  Diagnosis Date  . Allergic rhinitis   . Arthritis    wrists  . Cardiomyopathy, nonischemic [facility])    followed by cardiology--- dr [name]---  2016 ef 45-50% ,  2016 nuclear ef 37%,  2017 per echo ef 50-55%  . CHF (congestive heart failure), NYHA class III ([place]) 03/21/2020  . GERD (gastroesophageal reflux disease)   . Hiatal hernia   . History of kidney stones   . 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 History of syncope (03-06-2020 pt stated has not had sycopal episode in few years, stated it seems to happen in extreme hot conditions)   cardiologist--- dr [name]--- dx recurrent syncope;  nuclear study 11-03-2014 intermediate risk w/ no ischemia, apical hypokinesis, nuclear ef 37%;  event monitor-- 12-21-2015 SB/ ST  no pauses/ arrythmia's;  echo 08-03-2015 ef 50-55%  . Hyperlipidemia   . Hypertension     followed by pcp  . Hypovitaminosis D   . Mass of left testicle   . Nocturia   . Plantar fasciitis   . Presence of surgical incision    01/ 2022  moh's w/ skin graft of nose, per pt still healing and wear bandage daily  . Type 2 diabetes mellitus ([place])    pt is adament that he is not and have been told he is a diabetic but a borderline;  followed by pcp, in pcp note states DM2 and takes 2 meds daily  . Wears glasses   . Wears hearing aid in both ears     Patient Active Problem List   Diagnosis Date Noted  . 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 Shortness of breath 03/21/2020  . Acute pulmonary edema ([place]) 03/21/2020  . Indigestion 03/21/2020  . Troponin level elevated 03/21/2020  . CHF (congestive heart failure), NYHA class III ([place]) 03/21/2020  . 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 NSTEMI (non-ST elevated myocardial infarction) ([place]) 03/21/2020  . Hyperlipidemia 01/30/2015  . Nonischemic cardiomyopathy ([place]) 01/30/2015  . Essential hypertension 01/30/2015  . Faintness 01/30/2015  . Chest pain 10/24/2014  . Cardiomyopathy ([place]) 10/24/2014  . Allergic rhinitis   . Syncope   .  Nephrolithiasis   . Plantar fasciitis   . Hypovitaminosis D     Past Surgical History:  Procedure Laterality Date  . COLONOSCOPY  last one 01-30-2017  . CORONARY ARTERY BYPASS GRAFT N/A 03/25/2020   Procedure: CORONARY ARTERY BYPASS GRAFTING (CABG), ON PUMP, TIMES FOUR, USING BILATERAL INTERNAL MAMMARY ARTERIES AND LEFT RADIAL ARTERY;  Surgeon: [name], MD;  Location: [facility];  Service: Open Heart Surgery;  Laterality: N/A;  . LOW ANTERIOR RESECTION RECTUM W/ COLOPROCTOSTOMY  05/2003  . MOHS SURGERY  01/2020   nose w/ graft  . ORCHIECTOMY Left 03/11/2020   Procedure: [place];  Surgeon: [name], MD;  Location: [facility];  Service: Urology;   Laterality: Left;  ONLY NEEDS 60 MIN  . RADIAL ARTERY HARVEST Left 03/25/2020   Procedure: LEFT RADIAL ARTERY HARVEST;  Surgeon: [name], MD;  Location: [place] [facility];  Service: Open Heart Surgery;  Laterality: Left;  . RIGHT/LEFT HEART CATH AND CORONARY ANGIOGRAPHY N/A 03/23/2020   Procedure: RIGHT/LEFT HEART CATH AND CORONARY ANGIOGRAPHY;  Surgeon: [name], MD;  Location: [facility];  Service: Cardiovascular;  Laterality: N/A;  . SHOULDER SURGERY Right 1992; 07/ 2021  . 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 TEE WITHOUT CARDIOVERSION N/A 03/25/2020   Procedure: TRANSESOPHAGEAL ECHOCARDIOGRAM (TEE);  Surgeon: [name], MD;  Location: [place];  Service: Open Heart Surgery;  Laterality: N/A;  . TONSILLECTOMY AND ADENOIDECTOMY  child  . UPPER GASTROINTESTINAL ENDOSCOPY  last one 06-06-2017    Prior to Admission medications   Medication Sig Start Date End Date Taking? Authorizing Provider  acetaminophen (TYLENOL) 325 MG tablet Take 2 tablets (650 mg total) by mouth every 4 (four) hours as needed for headache or mild pain. 04/01/20   Barrett, Lodema Hong, PA-C  amiodarone (PACERONE) 200 MG tablet Take 2 tablets (400 mg total) by mouth 2 (two) times daily. Until 3/13, then decrease to 200 mg daily 04/01/20   Barrett, Erin R, PA-C  Apoaequorin (PREVAGEN PO) Take 1 tablet by mouth at bedtime.    [provider]  aspirin EC 325 MG EC tablet Take 1 tablet (325 mg total) by mouth daily. 04/01/20   Barrett, Erin R, PA-C  carvedilol (COREG) 6.25 MG tablet Take 1 tablet (6.25 mg total) by mouth 2 (two) times daily. 10/01/19   Dorothy Spark, MD  cholecalciferol (VITAMIN D) 1000 UNITS tablet Take 1,000 Units by mouth See admin instructions. Mon-friday    [provider]  clopidogrel (PLAVIX) 75 MG tablet Take 1 tablet (75 mg total) by mouth daily. 04/01/20 04/01/21  Barrett, Erin R, PA-C  furosemide (LASIX) 40 MG tablet Take 1  tablet (40 mg total) by mouth daily. 04/02/20   Barrett, Lodema Hong, PA-C  glucosamine-chondroitin 500-400 MG tablet Take 1 tablet by mouth. 5 times weekly    [provider]  isosorbide dinitrate (ISORDIL) 10 MG tablet Take 1 tablet (10 mg total) by mouth 3 (three) times daily. 04/01/20 04/30/20  Barrett, Erin R, PA-C  lisinopril (ZESTRIL) 5 MG tablet Take 1 tablet (5 mg total) by mouth daily. 04/01/20   Barrett, Erin R, PA-C  loperamide (IMODIUM) 2 MG capsule Take 1 capsule (2 mg total) by mouth as needed for diarrhea or loose stools. 04/01/20   Barrett, Erin R, PA-C  metFORMIN (GLUCOPHAGE) 1000 MG tablet Take 1,000 mg by mouth 2 (two) times daily with  a meal.     [provider]  Multiple Vitamin (MULTIVITAMIN WITH MINERALS) TABS tablet Take 1 tablet by mouth. 5 times weekly    [provider]  omeprazole (PRILOSEC) 20 MG capsule Take 20 mg by mouth daily.    [provider]  pioglitazone (ACTOS) 30 MG tablet Take 30 mg by mouth daily.    [provider]  Potassium Chloride ER 20 MEQ TBCR Take 20 mEq by mouth daily. 04/02/20   Barrett, Erin R, PA-C  pravastatin (PRAVACHOL) 80 MG tablet Take 1 tablet (80 mg total) by mouth daily. Patient taking differently: Take 80 mg by mouth at bedtime. 10/01/19   Dorothy Spark, MD  tamsulosin (FLOMAX) 0.4 MG CAPS capsule Take 0.4 mg by mouth at bedtime.    [provider]  traMADol (ULTRAM) 50 MG tablet Take 1 tablet (50 mg total) by mouth every 4 (four) hours as needed for moderate pain. 04/01/20   Barrett, Erin R, PA-C  UNABLE TO FIND Take 1 tablet by mouth 2 (two) times a day. Study cholesterol drug if drug name needed please call April McCaskill at [phone]    [provider]  vitamin B-12 (CYANOCOBALAMIN) 1000 MCG tablet Take 1,000 mcg by mouth. 5 times a week    [provider]    Allergies Patient has no known allergies.  Family History  Problem Relation Age of Onset  . Heart attack  Mother   . Stroke Father   . Colon cancer Neg Hx   . Esophageal cancer Neg Hx   . Pancreatic cancer Neg Hx   . Rectal cancer Neg Hx   . Stomach cancer Neg Hx   . Colon polyps Neg Hx     Social History Social History   Tobacco Use  . Smoking status: Former Smoker    Years: 5.00    Types: Pipe, Cigars    Quit date: 11/29/1976    Years since quitting: 43.3  . Smokeless tobacco: Never Used  Vaping Use  . Vaping Use: Never used  Substance Use Topics  . Alcohol use: Yes    Alcohol/week: 0.0 standard drinks    Comment: Occasional drink   . Drug use: Never    Review of Systems  Constitutional: Negative for fever, chills or body aches. Cardiovascular: Negative for chest pain or chest tightness. Respiratory: Negative for cough or shortness of breath. Gastrointestinal: Negative for abdominal pain. Genitourinary: Positive for leaking around his Foley. Musculoskeletal: Negative for low back pain.  ____________________________________________  PHYSICAL EXAM:  VITAL SIGNS: ED Triage Vitals  Enc Vitals Group     BP 04/05/20 1529 104/68     Pulse Rate 04/05/20 1529 80     Resp 04/05/20 1529 20     Temp 04/05/20 1529 97.7 F (36.5 C)     Temp Source 04/05/20 1529 Oral     SpO2 04/05/20 1529 100 %     Weight --      Height --      Head Circumference --      Peak Flow --      Pain Score 04/05/20 1532 0     Pain Loc --      Pain Edu? --      Excl. in [place]? --     Constitutional: Alert and oriented.  Obese, in no distress. Head: Normocephalic and atraumatic. Eyes: Conjunctivae are normal. PERRL. Normal extraocular movements Cardiovascular: Normal rate, regular rhythm.  Respiratory: Normal respiratory effort. No wheezes/rales/rhonchi. Gastrointestinal: Soft  and nontender. No distention. GU: Foley intact. No leakage at the urethral meatus noted. Neurologic:  Normal speech and language. No gross focal neurologic deficits are  appreciated.  ____________________________________________   INITIAL IMPRESSION / ASSESSMENT AND PLAN / ED COURSE  Problem with Foley Catheter:  No obvious leakage around the Foley at this time I had him change positions, walk- still observed no leakage from the Foley We will move the Foley Holder closer up the thigh so it is less taught to see if that helps Continue Flomax Advised him to call [facility] tomorrow for follow up appt  ____________________________________________  FINAL CLINICAL IMPRESSION(S) / ED DIAGNOSES  Final diagnoses:  Problem with Foley catheter, subsequent encounter      Jearld Fenton, NP 04/05/20 1738    Nance Pear, MD 04/05/20 1758

## 2020-04-05 NOTE — Telephone Encounter (Signed)
Returned patient call. He recently had a foley catheter placed in urology clinic for urinary retention. His foley was draining well until this morning ~9am. He has since been leaking around the foley with no urine output via foley. Reports that urine in yellow.  I discussed that it sounds like his foley might be clogged. I recommended going to the ED for foley irrigation to relieve possible obstruction versus foley replacement if unable to relieve obstruction.

## 2020-04-05 NOTE — ED Notes (Signed)
This RN with assistance of Crystal RN removed a 2f indwelling foley. Inserted 40f foley using sterile technique. Pt urinated around that catheter. Informed EDP who stated to try a larger foley. Called supply chain to obtain urology cart.

## 2020-04-05 NOTE — ED Notes (Signed)
Inflated balloon from 106ml to 81ml to assess if that stops leaking around catheter. Will continue to monitor. Pt sleeping at this time.

## 2020-04-05 NOTE — ED Notes (Signed)
Patient assisted to restroom from wheelchair. Patient able to ambulate to stretcher from wheelchair. Patient states "there is a good amount of urine in the bag but my foley is still leaking". Patient was recently discharged after being seen today for the same issue.

## 2020-04-05 NOTE — ED Triage Notes (Signed)
Pt states got about 15 minutes down the road and noted some leaking from his foley again.

## 2020-04-05 NOTE — ED Provider Notes (Signed)
[facility] Emergency Department Provider Note  ____________________________________________   Event Date/Time   First MD Initiated Contact with Patient 04/05/20 1314     (approximate)  I have reviewed the triage vital signs and the nursing notes.   HISTORY  Chief Complaint Needs foley replaced    HPI Roy [name] is a 75 y.o. 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  He denies any acute headache, earache, sore throat, chest pain, cough, shortness of breath, nausea, vomiting, diarrhea, burning urination, blood in his urine, recent falls or injuries or any other acute complaints.  States he has been taking all his medicines as directed.  He states his primary concern is that his urine leaking around his Foley.       Past Medical History:  Diagnosis Date  . Allergic rhinitis   . Arthritis    wrists  . Cardiomyopathy, nonischemic [facility])    followed by cardiology--- dr [name]---  2016 ef 45-50% ,  2016 nuclear ef 37%,  2017 per echo ef 50-55%  . CHF (congestive heart failure), NYHA class III ([place]) 03/21/2020  . GERD (gastroesophageal reflux disease)   . Hiatal hernia   . History of kidney stones   . 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 History of syncope (03-06-2020 pt stated has not had sycopal episode in few years, stated it seems to happen in extreme hot conditions)   cardiologist--- dr [name]--- dx recurrent syncope;  nuclear study 11-03-2014 intermediate risk w/ no ischemia, apical hypokinesis, nuclear ef 37%;  event monitor-- 12-21-2015 SB/ ST  no pauses/ arrythmia's;  echo  08-03-2015 ef 50-55%  . Hyperlipidemia   . Hypertension    followed by pcp  . Hypovitaminosis D   . Mass of left testicle   . Nocturia   . Plantar fasciitis   . Presence of surgical incision    01/ 2022  moh's w/ skin graft of nose, per pt still healing and wear bandage daily  . Type 2 diabetes mellitus ([place])    pt is adament that he is not and have been told he is a diabetic but a borderline;  followed by pcp, in pcp note states DM2 and takes 2 meds daily  . Wears glasses   . Wears hearing aid in both ears     Patient Active Problem List   Diagnosis Date Noted  . 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 Shortness of breath 03/21/2020  . Acute pulmonary edema ([place]) 03/21/2020  . Indigestion 03/21/2020  . Troponin level elevated 03/21/2020  . CHF (congestive heart failure), NYHA class III ([place]) 03/21/2020  . 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 NSTEMI (non-ST elevated myocardial infarction) ([place]) 03/21/2020  . Hyperlipidemia 01/30/2015  . Nonischemic cardiomyopathy ([place]) 01/30/2015  . Essential hypertension 01/30/2015  . Faintness 01/30/2015  . Chest pain 10/24/2014  . Cardiomyopathy ([place]) 10/24/2014  . Allergic rhinitis   . Syncope   . Nephrolithiasis   . Plantar fasciitis   .  Hypovitaminosis D     Past Surgical History:  Procedure Laterality Date  . COLONOSCOPY  last one 01-30-2017  . CORONARY ARTERY BYPASS GRAFT N/A 03/25/2020   Procedure: CORONARY ARTERY BYPASS GRAFTING (CABG), ON PUMP, TIMES FOUR, USING BILATERAL INTERNAL MAMMARY ARTERIES AND LEFT RADIAL ARTERY;  Surgeon: [name], MD;  Location: [place];  Service: Open Heart Surgery;  Laterality: N/A;  . LOW ANTERIOR RESECTION RECTUM W/ COLOPROCTOSTOMY  05/2003  . MOHS SURGERY  01/2020   nose w/ graft  . ORCHIECTOMY Left 03/11/2020   Procedure: [place];  Surgeon: [name], MD;   Location: [facility];  Service: Urology;  Laterality: Left;  ONLY NEEDS 60 MIN  . RADIAL ARTERY HARVEST Left 03/25/2020   Procedure: LEFT RADIAL ARTERY HARVEST;  Surgeon: [name], MD;  Location: [place];  Service: Open Heart Surgery;  Laterality: Left;  . RIGHT/LEFT HEART CATH AND CORONARY ANGIOGRAPHY N/A 03/23/2020   Procedure: RIGHT/LEFT HEART CATH AND CORONARY ANGIOGRAPHY;  Surgeon: [name], MD;  Location: [place] [facility];  Service: Cardiovascular;  Laterality: N/A;  . SHOULDER SURGERY Right 1992; 07/ 2021  . 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 TEE WITHOUT CARDIOVERSION N/A 03/25/2020   Procedure: TRANSESOPHAGEAL ECHOCARDIOGRAM (TEE);  Surgeon: [name], MD;  Location: [place];  Service: Open Heart Surgery;  Laterality: N/A;  . TONSILLECTOMY AND ADENOIDECTOMY  child  . UPPER GASTROINTESTINAL ENDOSCOPY  last one 06-06-2017    Prior to Admission medications   Medication Sig Start Date End Date Taking? Authorizing Provider  acetaminophen (TYLENOL) 325 MG tablet Take 2 tablets (650 mg total) by mouth every 4 (four) hours as needed for headache or mild pain. 04/01/20   Barrett, Lodema Hong, PA-C  amiodarone (PACERONE) 200 MG tablet Take 2 tablets (400 mg total) by mouth 2 (two) times daily. Until 3/13, then decrease to 200 mg daily 04/01/20   Barrett, Erin R, PA-C  Apoaequorin (PREVAGEN PO) Take 1 tablet by mouth at bedtime.    [provider]  aspirin EC 325 MG EC tablet Take 1 tablet (325 mg total) by mouth daily. 04/01/20   Barrett, Erin R, PA-C  carvedilol (COREG) 6.25 MG tablet Take 1 tablet (6.25 mg total) by mouth 2 (two) times daily. 10/01/19   Dorothy Spark, MD  cholecalciferol (VITAMIN D) 1000 UNITS tablet Take 1,000 Units by mouth See admin instructions. Mon-friday    [provider]  clopidogrel (PLAVIX) 75 MG tablet Take 1 tablet (75 mg total) by mouth daily. 04/01/20 04/01/21   Barrett, Erin R, PA-C  furosemide (LASIX) 40 MG tablet Take 1 tablet (40 mg total) by mouth daily. 04/02/20   Barrett, Lodema Hong, PA-C  glucosamine-chondroitin 500-400 MG tablet Take 1 tablet by mouth. 5 times weekly    [provider]  isosorbide dinitrate (ISORDIL) 10 MG tablet Take 1 tablet (10 mg total) by mouth 3 (three) times daily. 04/01/20 04/30/20  Barrett, Erin R, PA-C  lisinopril (ZESTRIL) 5 MG tablet Take 1 tablet (5 mg total) by mouth daily. 04/01/20   Barrett, Erin R, PA-C  loperamide (IMODIUM) 2 MG capsule Take 1 capsule (2 mg total) by mouth as needed for diarrhea or loose stools. 04/01/20   Barrett, Erin R, PA-C  metFORMIN (GLUCOPHAGE) 1000 MG tablet Take 1,000 mg by mouth 2 (two) times daily with a meal.     [provider]  Multiple Vitamin (MULTIVITAMIN WITH MINERALS) TABS tablet Take 1 tablet by mouth. 5 times weekly    [provider]  omeprazole (PRILOSEC) 20 MG capsule Take 20 mg by mouth daily.    [provider]  pioglitazone (ACTOS) 30 MG tablet Take 30 mg by mouth daily.    [provider]  Potassium Chloride ER 20 MEQ TBCR Take 20 mEq by mouth daily. 04/02/20   Barrett, Erin R, PA-C  pravastatin (PRAVACHOL) 80 MG tablet Take 1 tablet (80 mg total) by mouth daily. Patient taking differently: Take 80 mg by mouth at bedtime. 10/01/19   Dorothy Spark, MD  tamsulosin (FLOMAX) 0.4 MG CAPS capsule Take 0.4 mg by mouth at bedtime.    [provider]  traMADol (ULTRAM) 50 MG tablet Take 1 tablet (50 mg total) by mouth every 4 (four) hours as needed for moderate pain. 04/01/20   Barrett, Erin R, PA-C  UNABLE TO FIND Take 1 tablet by mouth 2 (two) times a day. Study cholesterol drug if drug name needed please call April McCaskill at [phone]    [provider]  vitamin B-12 (CYANOCOBALAMIN) 1000 MCG tablet Take 1,000 mcg by mouth. 5 times a week    [provider]    Allergies Patient has no known  allergies.  Family History  Problem Relation Age of Onset  . Heart attack Mother   . Stroke Father   . Colon cancer Neg Hx   . Esophageal cancer Neg Hx   . Pancreatic cancer Neg Hx   . Rectal cancer Neg Hx   . Stomach cancer Neg Hx   . Colon polyps Neg Hx     Social History Social History   Tobacco Use  . Smoking status: Former Smoker    Years: 5.00    Types: Pipe, Cigars    Quit date: 11/29/1976    Years since quitting: 43.3  . Smokeless tobacco: Never Used  Vaping Use  . Vaping Use: Never used  Substance Use Topics  . Alcohol use: Yes    Alcohol/week: 0.0 standard drinks    Comment: Occasional drink   . Drug use: Never    Review of Systems  Review of Systems  Constitutional: Negative for chills and fever.  HENT: Negative for sore throat.   Eyes: Negative for pain.  Respiratory: Negative for cough and stridor.   Cardiovascular: Negative for chest pain.  Gastrointestinal: Negative for vomiting.  Genitourinary: Negative for dysuria.  Musculoskeletal: Negative for myalgias.  Skin: Negative for rash.  Neurological: Negative for seizures, loss of consciousness and headaches.  Psychiatric/Behavioral: Negative for suicidal ideas.  All other systems reviewed and are negative.     ____________________________________________   PHYSICAL EXAM:  VITAL SIGNS: ED Triage Vitals  Enc Vitals Group     BP 04/05/20 1259 (!) 88/63     Pulse Rate 04/05/20 1259 77     Resp 04/05/20 1259 16     Temp 04/05/20 1259 97.8 F (36.6 C)     Temp Source 04/05/20 1259 Oral     SpO2 04/05/20 1259 98 %     Weight 04/05/20 1258 222 lb (100.7 kg)     Height 04/05/20 1258 6\' 1"  (1.854 m)     Head Circumference --      Peak Flow --      Pain Score 04/05/20 1258 0     Pain Loc --      Pain Edu? --      Excl. in  GC? --    Vitals:   04/05/20 1259 04/05/20 1330  BP: (!) 88/63 107/76  Pulse: 77   Resp: 16   Temp: 97.8 F (36.6 C)   SpO2: 98%    Physical Exam Vitals and  nursing note reviewed.  Constitutional:      Appearance: He is well-developed.  HENT:     Head: Normocephalic and atraumatic.     Right Ear: External ear normal.     Left Ear: External ear normal.     Nose: Nose normal.  Eyes:     Conjunctiva/sclera: Conjunctivae normal.  Cardiovascular:     Rate and Rhythm: Normal rate and regular rhythm.     Heart sounds: No murmur heard.   Pulmonary:     Effort: Pulmonary effort is normal. No respiratory distress.     Breath sounds: Normal breath sounds.  Abdominal:     Palpations: Abdomen is soft.     Tenderness: There is no abdominal tenderness.  Musculoskeletal:     Cervical back: Neck supple.  Skin:    General: Skin is warm and dry.     Capillary Refill: Capillary refill takes less than 2 seconds.  Neurological:     Mental Status: He is alert and oriented to person, place, and time.  Psychiatric:        Mood and Affect: Mood normal.     Patient has a 14 size Foley in place without any urine following. ____________________________________________   LABS (all labs ordered are listed, but only abnormal results are displayed)  Labs Reviewed - No data to display ____________________________________________  EKG  ____________________________________________  RADIOLOGY  ED MD interpretation:   Official radiology report(s): No results found.  ____________________________________________   PROCEDURES  Procedure(s) performed (including Critical Care):  Procedures   ____________________________________________   INITIAL IMPRESSION / ASSESSMENT AND PLAN / ED COURSE      Patient presents with above stage III exam requesting assistance with his Foley she started noticing some leakage around it and no flow of urine through the actual Foley catheter beginning today.  This was placed 3 days ago for some retention.  On arrival he is afebrile and hemodynamically stable.  He denies any other acute sick symptoms and will  suspicion for any other immediate life-threatening pathology at this time.  Foley was replaced and upsized in size 16.  Patient has no leakage around the Foley and had return of yellow urine.  He denied any other acute concerns and given otherwise reassuring exam I believe he is safe for discharge with plan for outpatient follow-up.  Suspect patient's initial blood pressure is likely erroneous as that was taken with a blood pressure cuff that was too large over several layers of clothing on recheck he is hemodynamically stable.      ____________________________________________   FINAL CLINICAL IMPRESSION(S) / ED DIAGNOSES  Final diagnoses:  Problem with Foley catheter, initial encounter ([place])    Medications - No data to display   ED Discharge Orders    None       Note:  This document was prepared using Dragon voice recognition software and may include unintentional dictation errors.   Lucrezia Starch, MD 04/05/20 [phone]

## 2020-04-05 NOTE — ED Triage Notes (Signed)
Pt to ED via POV stating that he had a catheter placed on Friday due to not being able to urinate. Pt states that catheter had been working fine up until this morning. Pt states that he went to have a BM and noticed that the catheter was leaking around insertion site and now he is not able to pass urine through the foley. Pt was advised by urology to come to the ED to have catheter replaced. Pt is in NAD.

## 2020-04-05 NOTE — Discharge Instructions (Addendum)
You were seen today for a problem with your Foley catheter.  We did not

## 2020-04-05 NOTE — ED Notes (Signed)
Leg bag attached to patient foley. Wife and patient educated om how to apply and remove and empty.

## 2020-04-06 ENCOUNTER — Ambulatory Visit: Payer: Medicare HMO | Admitting: Cardiothoracic Surgery

## 2020-04-09 ENCOUNTER — Other Ambulatory Visit: Payer: Self-pay | Admitting: Cardiothoracic Surgery

## 2020-04-09 DIAGNOSIS — Z951 Presence of aortocoronary bypass graft: Secondary | ICD-10-CM

## 2020-04-10 ENCOUNTER — Telehealth: Payer: Self-pay | Admitting: *Deleted

## 2020-04-10 ENCOUNTER — Other Ambulatory Visit: Payer: Self-pay | Admitting: Cardiology

## 2020-04-10 ENCOUNTER — Ambulatory Visit: Payer: Medicare HMO | Admitting: Adult Health

## 2020-04-10 DIAGNOSIS — I10 Essential (primary) hypertension: Secondary | ICD-10-CM

## 2020-04-10 DIAGNOSIS — E782 Mixed hyperlipidemia: Secondary | ICD-10-CM

## 2020-04-10 NOTE — Telephone Encounter (Signed)
Mr. Cansler called regarding drainage from sternal incision. Per patient, drainage is clear in color with a slight tinge of red. Patient denies white drainage, redness, warmth at site or fever. Patient sent photo to RN office phone. Clear drainage observed in photo. Patient advised to keep incision clean with soap and water and to keep dry. Patient instructed to call back if drainage changes, he develops fever, or redness develops over the weekend. Patient has f/u appt with Dr. [name] Monday, March 21. No further questions at this time.

## 2020-04-13 ENCOUNTER — Ambulatory Visit
Admission: RE | Admit: 2020-04-13 | Discharge: 2020-04-13 | Disposition: A | Discharge status: Home / Self Care | Payer: Medicare HMO | Source: Ambulatory Visit | Attending: Cardiothoracic Surgery | Admitting: Cardiothoracic Surgery

## 2020-04-13 ENCOUNTER — Encounter: Payer: Self-pay | Admitting: Cardiothoracic Surgery

## 2020-04-13 ENCOUNTER — Other Ambulatory Visit: Payer: Self-pay

## 2020-04-13 ENCOUNTER — Ambulatory Visit (INDEPENDENT_AMBULATORY_CARE_PROVIDER_SITE_OTHER): Payer: Self-pay | Admitting: Cardiothoracic Surgery

## 2020-04-13 VITALS — BP 119/77 | HR 104 | Resp 20 | Ht 73.0 in | Wt 210.0 lb

## 2020-04-13 DIAGNOSIS — Z951 Presence of aortocoronary bypass graft: Secondary | ICD-10-CM

## 2020-04-13 DIAGNOSIS — J90 Pleural effusion, not elsewhere classified: Secondary | ICD-10-CM | POA: Diagnosis not present

## 2020-04-13 IMAGING — CR DG CHEST 2V
2 series · 2 of 2 positions shown · non-contrast
Comparison: [DATE] and earlier.

CLINICAL DATA: 74-year-old male status post CABG earlier this
month.

EXAM:
CHEST - 2 VIEW

[w chest pa]
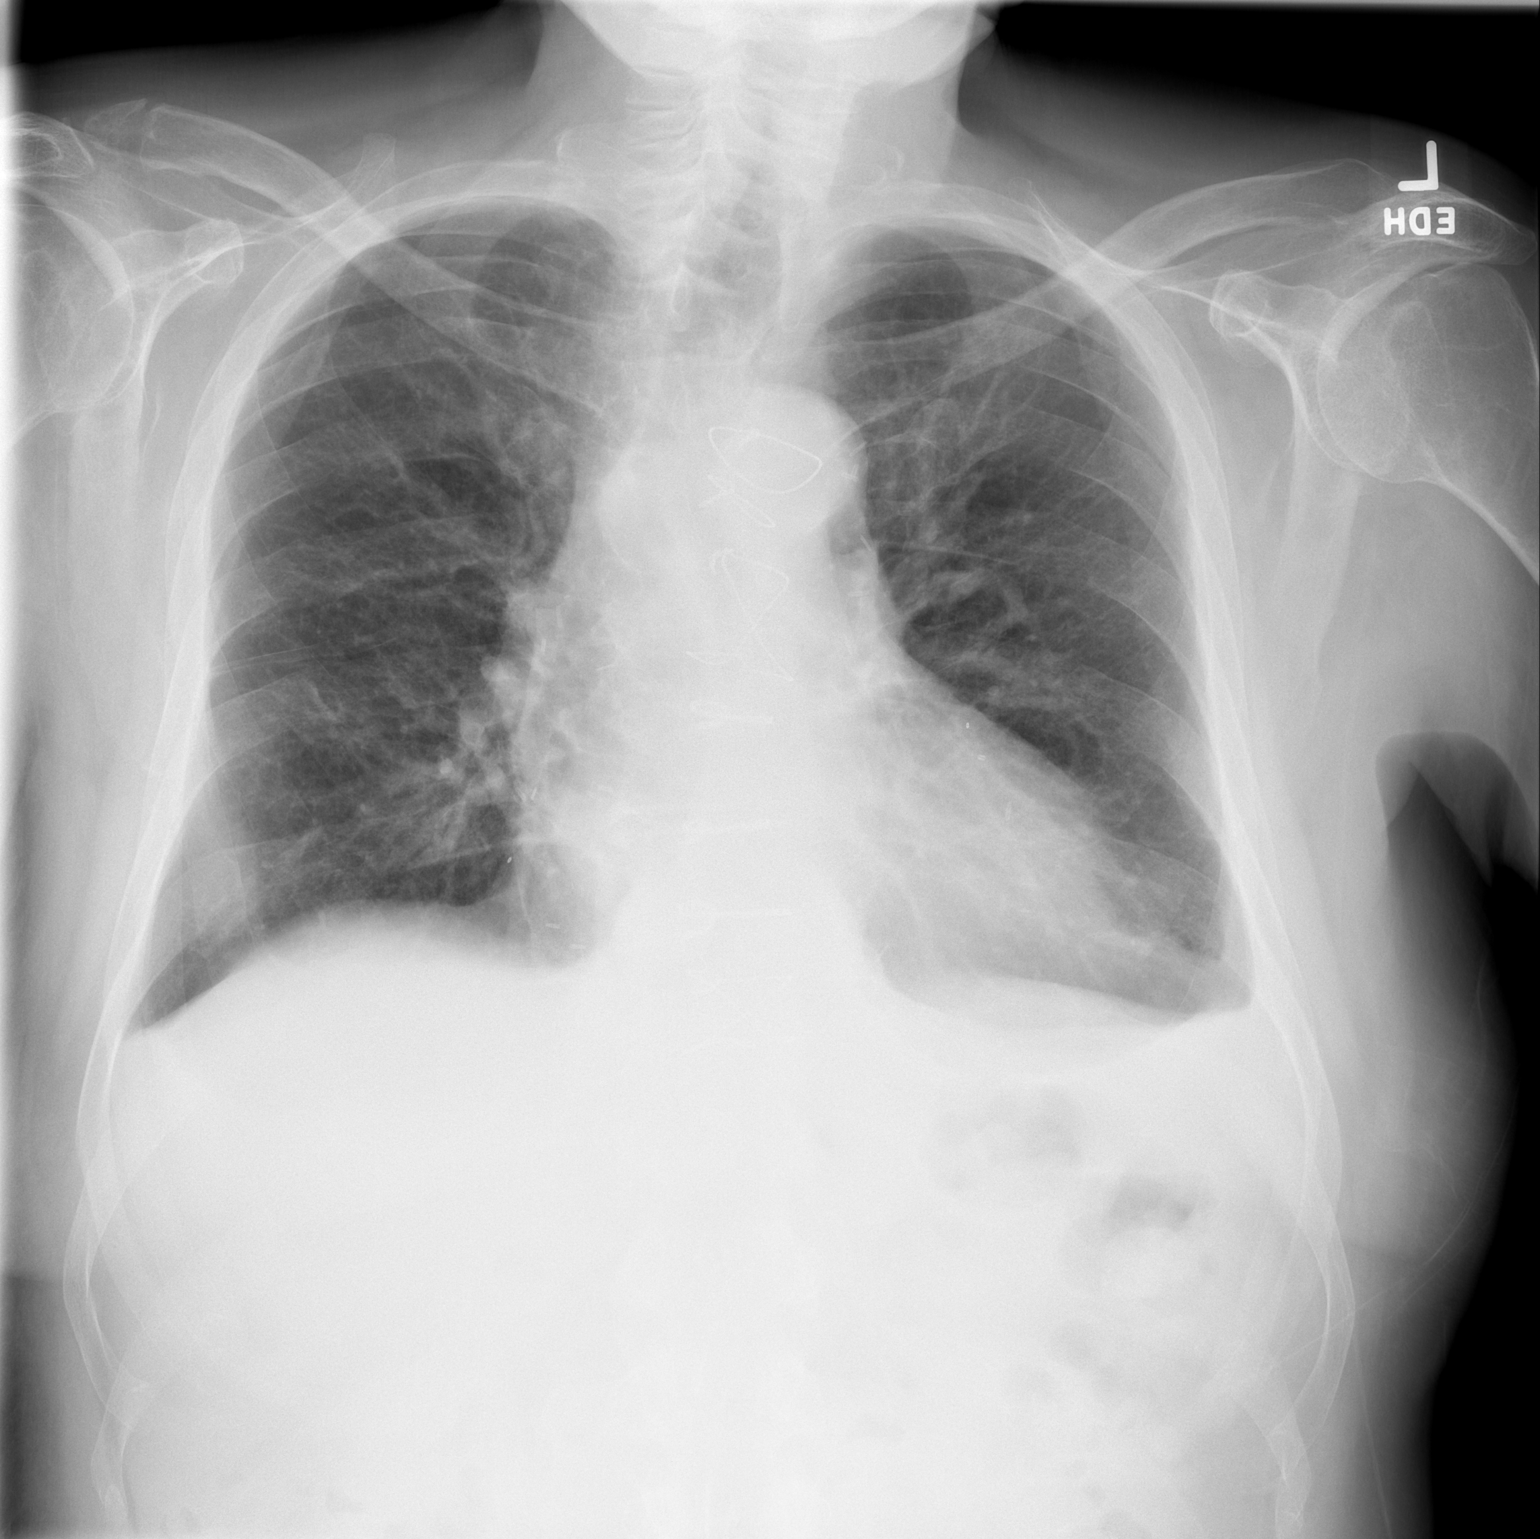

[w chest lat]
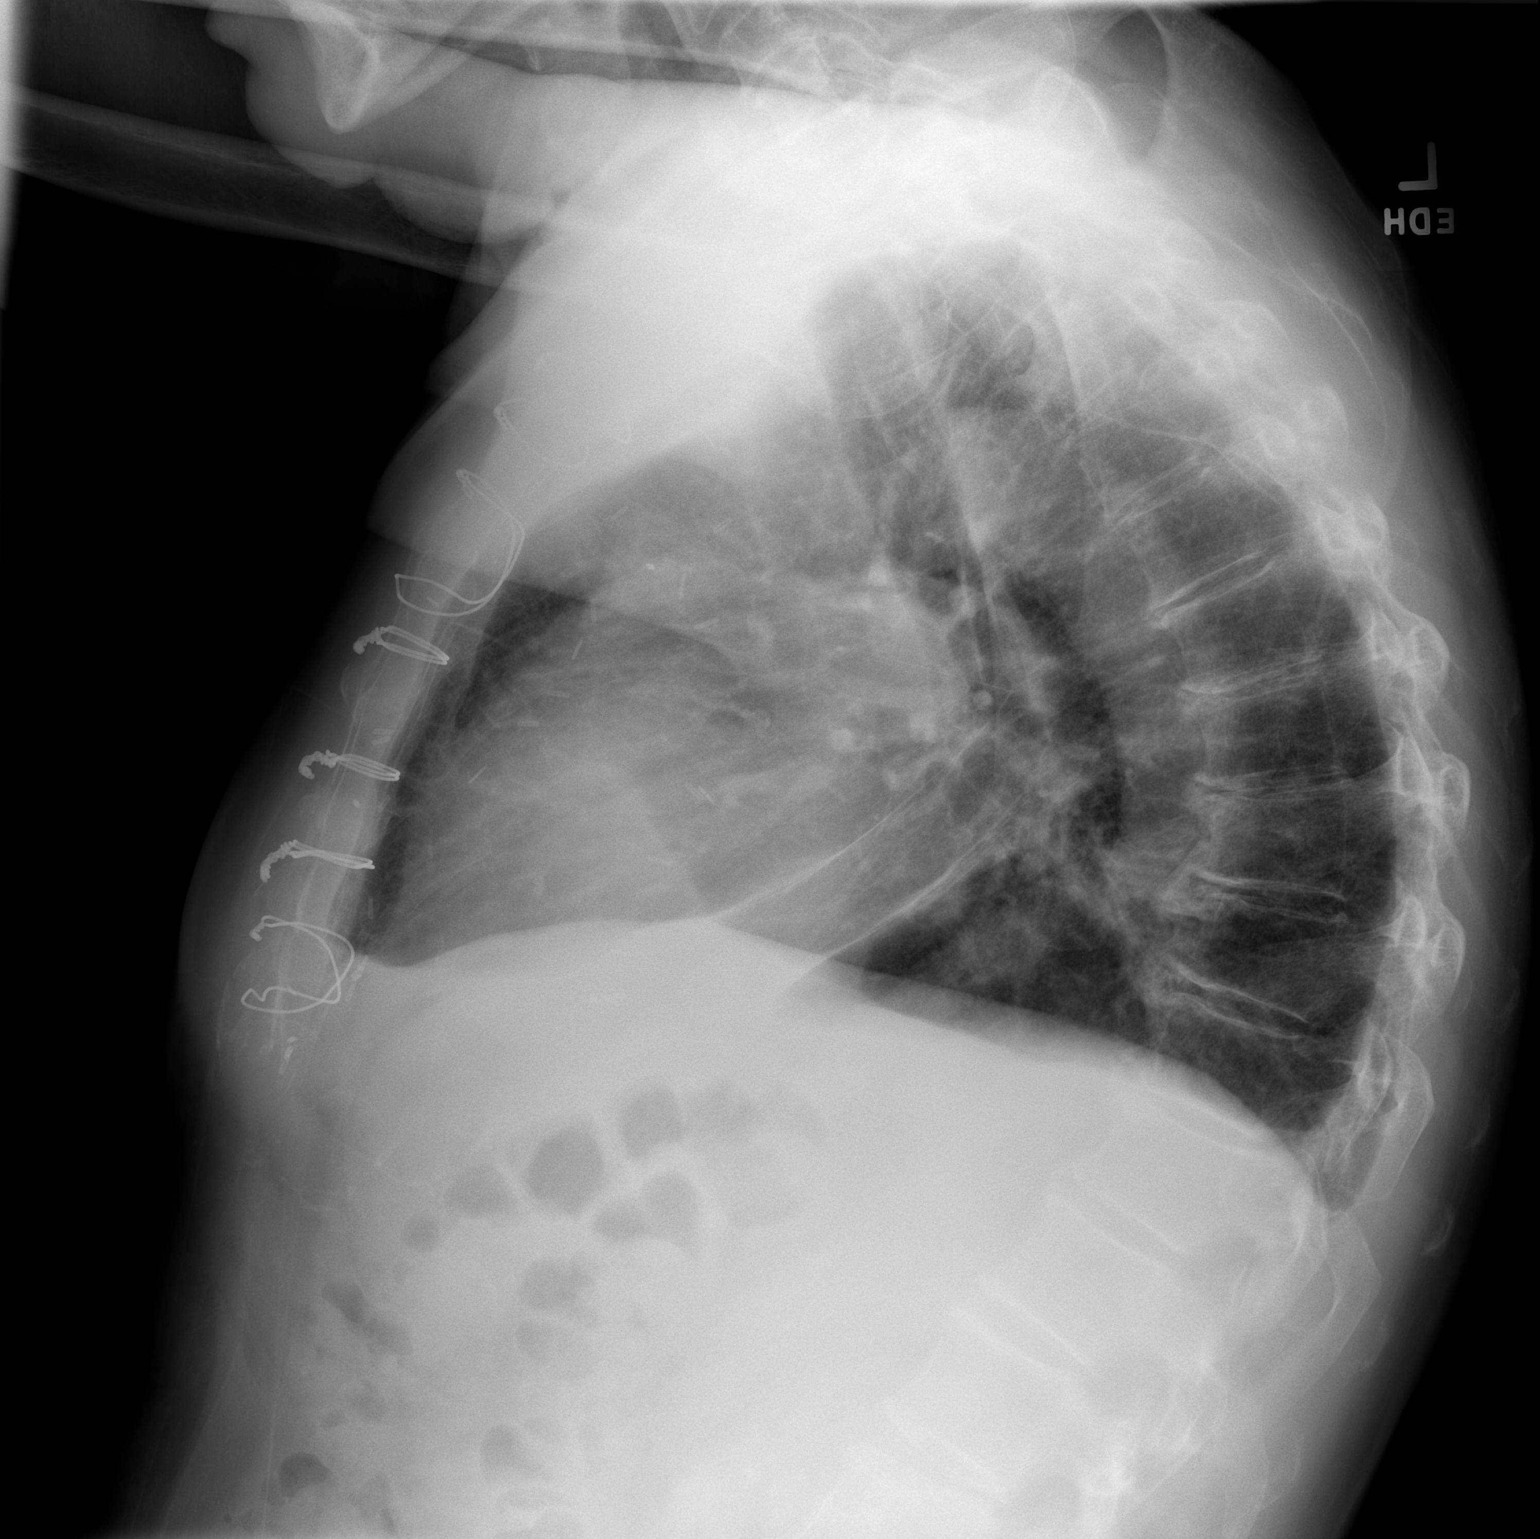

[2 of 2 positions shown; findings below may reference images not displayed]

FINDINGS: Increased AP dimension to the chest. Larger lung volumes. Stable
cardiac size and mediastinal contours. Persistent small bilateral
pleural effusions, not significantly changed. No pneumothorax or
pulmonary edema. No consolidation.

Stable visualized osseous structures. Negative visible bowel gas
pattern.
IMPRESSION: Persistent small pleural effusions. Larger lung volumes. No
pulmonary edema.

## 2020-04-13 NOTE — Progress Notes (Signed)
[address]       [place] [address]             [phone]     CARDIOTHORACIC SURGERY OFFICE NOTE  Referring Provider is [name], MD Primary Cardiologist is Ena Dawley, MD PCP is Jani Gravel, MD   HPI:  75 year old man is status post CABG 2-1/2 weeks ago.  He did well after surgery and is seen for his first postoperative visit.  He has no complaints of chest pain shortness of breath fevers or chills. He does have occasional nosebleeds and attributes this to aspirin 325 mg; however, he is also on Plavix  Current Outpatient Medications  Medication Sig Dispense Refill  . acetaminophen (TYLENOL) 325 MG tablet Take 2 tablets (650 mg total) by mouth every 4 (four) hours as needed for headache or mild pain.    [name] Apoaequorin (PREVAGEN PO) Take 1 tablet by mouth at bedtime.    . cholecalciferol (VITAMIN D) 1000 UNITS tablet Take 1,000 Units by mouth See admin instructions. Mon-friday    . glucosamine-chondroitin 500-400 MG tablet Take 1 tablet by mouth. 5 times weekly    . lisinopril (ZESTRIL) 5 MG tablet Take 1 tablet (5 mg total) by mouth daily. 30 tablet 3  . loperamide (IMODIUM) 2 MG capsule Take 1 capsule (2 mg total) by mouth as needed for diarrhea or loose stools. 30 capsule 0  . metFORMIN (GLUCOPHAGE) 1000 MG tablet Take 1,000 mg by mouth 2 (two) times daily with a meal.     . Multiple Vitamin (MULTIVITAMIN WITH MINERALS) TABS tablet Take 1 tablet by mouth. 5 times weekly    . omeprazole (PRILOSEC) 20 MG capsule Take 20 mg by mouth daily.    . pioglitazone (ACTOS) 30 MG tablet Take 30 mg by mouth daily.    . pravastatin (PRAVACHOL) 80 MG tablet Take 1 tablet (80 mg total) by mouth daily. (Patient taking differently: Take 80 mg by mouth at bedtime.) 90 tablet 1  . tamsulosin (FLOMAX) 0.4 MG CAPS capsule Take 0.4 mg by mouth at bedtime.    . traMADol (ULTRAM) 50 MG tablet Take 1 tablet (50 mg total) by mouth every 4 (four) hours as needed for  moderate pain. 30 tablet 0  . UNABLE TO FIND Take 1 tablet by mouth 2 (two) times a day. Study cholesterol drug if drug name needed please call April McCaskill at [phone]    . vitamin B-12 (CYANOCOBALAMIN) 1000 MCG tablet Take 1,000 mcg by mouth. 5 times a week    . amiodarone (PACERONE) 200 MG tablet Take 2 tablets (400 mg total) by mouth 2 (two) times daily. Until 3/13, then decrease to 200 mg daily (Patient not taking: Reported on 04/13/2020) 90 tablet 1  . carvedilol (COREG) 6.25 MG tablet TAKE 1 TABLET BY MOUTH 2 TIMES DAILY. (Patient not taking: Reported on 04/13/2020) 180 tablet 1   No current facility-administered medications for this visit.      Physical Exam:   BP 119/77 (BP Location: Left Arm, Patient Position: Sitting)   Pulse (!) 104   Resp 20   Ht 6\' 1"  (1.854 m)   Wt 95.3 kg   SpO2 95% Comment: RA  BMI 27.71 kg/m   General:  Well-appearing no acute distress  Chest:   Clear to auscultation bilaterally  CV:   Regular rate and rhythm, mildly tachycardic  Incisions:  Healing well  Abdomen:  Soft nontender  Extremities:  No edema  Diagnostic Tests:  Chest x-ray with clear lung fields and stable mediastinal silhouette   Impression:  Doing well after CABG  Plan:  Follow-up with thoracic surgery as needed Okay to drive in 2 weeks Stop amiodarone in 2 weeks Follow-up with Dr. Grayling Congress, [facility] cardiology Stop Plavix Change aspirin to 81 mg daily Stop isosorbide  I spent in excess of 15 minutes during the conduct of this office consultation and >50% of this time involved direct face-to-face encounter with the patient for counseling and/or coordination of their care.  Level 2                 10 minutes Level 3                 15 minutes Level 4                 25 minutes Level 5                 40 minutes  B.  Murvin Natal, MD 04/13/2020 10:09 AM

## 2020-04-15 ENCOUNTER — Other Ambulatory Visit: Payer: Self-pay

## 2020-04-15 ENCOUNTER — Ambulatory Visit (INDEPENDENT_AMBULATORY_CARE_PROVIDER_SITE_OTHER): Payer: Self-pay

## 2020-04-15 DIAGNOSIS — Z4802 Encounter for removal of sutures: Secondary | ICD-10-CM

## 2020-04-15 NOTE — Progress Notes (Signed)
Removed 37 staples from left forearm/radial harvest site. No signs of infection and patient tolerated well

## 2020-04-18 ENCOUNTER — Other Ambulatory Visit: Payer: Self-pay | Admitting: Cardiology

## 2020-04-18 DIAGNOSIS — E782 Mixed hyperlipidemia: Secondary | ICD-10-CM

## 2020-04-18 DIAGNOSIS — I10 Essential (primary) hypertension: Secondary | ICD-10-CM

## 2020-04-21 ENCOUNTER — Telehealth: Payer: Self-pay | Admitting: *Deleted

## 2020-04-21 NOTE — Telephone Encounter (Signed)
Roy [name] called stating he had difficulty falling asleep last night. States he is typically a side sleeper and lying on his back has been an adjustment for him. He states he experienced some SOB while lying horizontal in bed last night. Denies feeling SOB during the day or with activity. Denies edema or increased weight gain. States it only experiences the SOB while lying in bed. Patient states he had to sleep in the recliner last night. Discussed with patient that previous chest xrays have demonstrated stable pleural effusions. Patient states he is taking mucinex and is able to cough up sputum in the morning. Advised pt to sleep with pillows behind chest or in recliner if that makes him more comfortable. Advised that he may take over the counter sleep aids such as melatonin or Tylenol PM to aid him in falling asleep. Advised patient to call back if symptoms worsen of SOB begins to occur more frequently throughout the day. Patient verbalized understanding.

## 2020-04-23 ENCOUNTER — Other Ambulatory Visit: Payer: Self-pay

## 2020-04-23 ENCOUNTER — Ambulatory Visit
Admission: RE | Admit: 2020-04-23 | Discharge: 2020-04-23 | Disposition: A | Discharge status: Home / Self Care | Payer: Medicare HMO | Source: Ambulatory Visit | Attending: Cardiothoracic Surgery | Admitting: Cardiothoracic Surgery

## 2020-04-23 ENCOUNTER — Ambulatory Visit (INDEPENDENT_AMBULATORY_CARE_PROVIDER_SITE_OTHER): Payer: Self-pay | Admitting: Cardiothoracic Surgery

## 2020-04-23 ENCOUNTER — Other Ambulatory Visit: Payer: Self-pay | Admitting: Cardiothoracic Surgery

## 2020-04-23 VITALS — BP 140/84 | HR 100 | Resp 20 | Ht 73.0 in | Wt 210.0 lb

## 2020-04-23 DIAGNOSIS — R091 Pleurisy: Secondary | ICD-10-CM | POA: Diagnosis not present

## 2020-04-23 DIAGNOSIS — Z951 Presence of aortocoronary bypass graft: Secondary | ICD-10-CM

## 2020-04-23 DIAGNOSIS — I517 Cardiomegaly: Secondary | ICD-10-CM | POA: Diagnosis not present

## 2020-04-23 DIAGNOSIS — J90 Pleural effusion, not elsewhere classified: Secondary | ICD-10-CM | POA: Diagnosis not present

## 2020-04-23 LAB — CBC WITH DIFFERENTIAL/PLATELET
Absolute Monocytes: 500 cells/uL (ref 200–950)
Basophils Absolute: 70 cells/uL (ref 0–200)
Basophils Relative: 0.7 %
Eosinophils Absolute: 170 cells/uL (ref 15–500)
Eosinophils Relative: 1.7 %
HCT: 32.8 % — ABNORMAL LOW (ref 38.5–50.0)
Hemoglobin: 10.4 g/dL — ABNORMAL LOW (ref 13.2–17.1)
Lymphs Abs: 900 cells/uL (ref 850–3900)
MCH: 28.6 pg (ref 27.0–33.0)
MCHC: 31.7 g/dL — ABNORMAL LOW (ref 32.0–36.0)
MCV: 90.1 fL (ref 80.0–100.0)
MPV: 10.2 fL (ref 7.5–12.5)
Monocytes Relative: 5 %
Neutro Abs: 8360 cells/uL — ABNORMAL HIGH (ref 1500–7800)
Neutrophils Relative %: 83.6 %
Platelets: 346 10*3/uL (ref 140–400)
RBC: 3.64 10*6/uL — ABNORMAL LOW (ref 4.20–5.80)
RDW: 13.3 % (ref 11.0–15.0)
Total Lymphocyte: 9 %
WBC: 10 10*3/uL (ref 3.8–10.8)

## 2020-04-23 LAB — BASIC METABOLIC PANEL
BUN: 15 mg/dL (ref 7–25)
CO2: 25 mmol/L (ref 20–32)
Calcium: 8.6 mg/dL (ref 8.6–10.3)
Chloride: 105 mmol/L (ref 98–110)
Creat: 1.17 mg/dL (ref 0.70–1.18)
Glucose, Bld: 145 mg/dL — ABNORMAL HIGH (ref 65–139)
Potassium: 4.4 mmol/L (ref 3.5–5.3)
Sodium: 140 mmol/L (ref 135–146)

## 2020-04-23 IMAGING — DX DG CHEST 2V
2 series · 2 of 2 positions shown · non-contrast
Comparison: [DATE]

CLINICAL DATA: Pleurisy.

EXAM:
CHEST - 2 VIEW

[dg chest 2 view (1 of 2)]
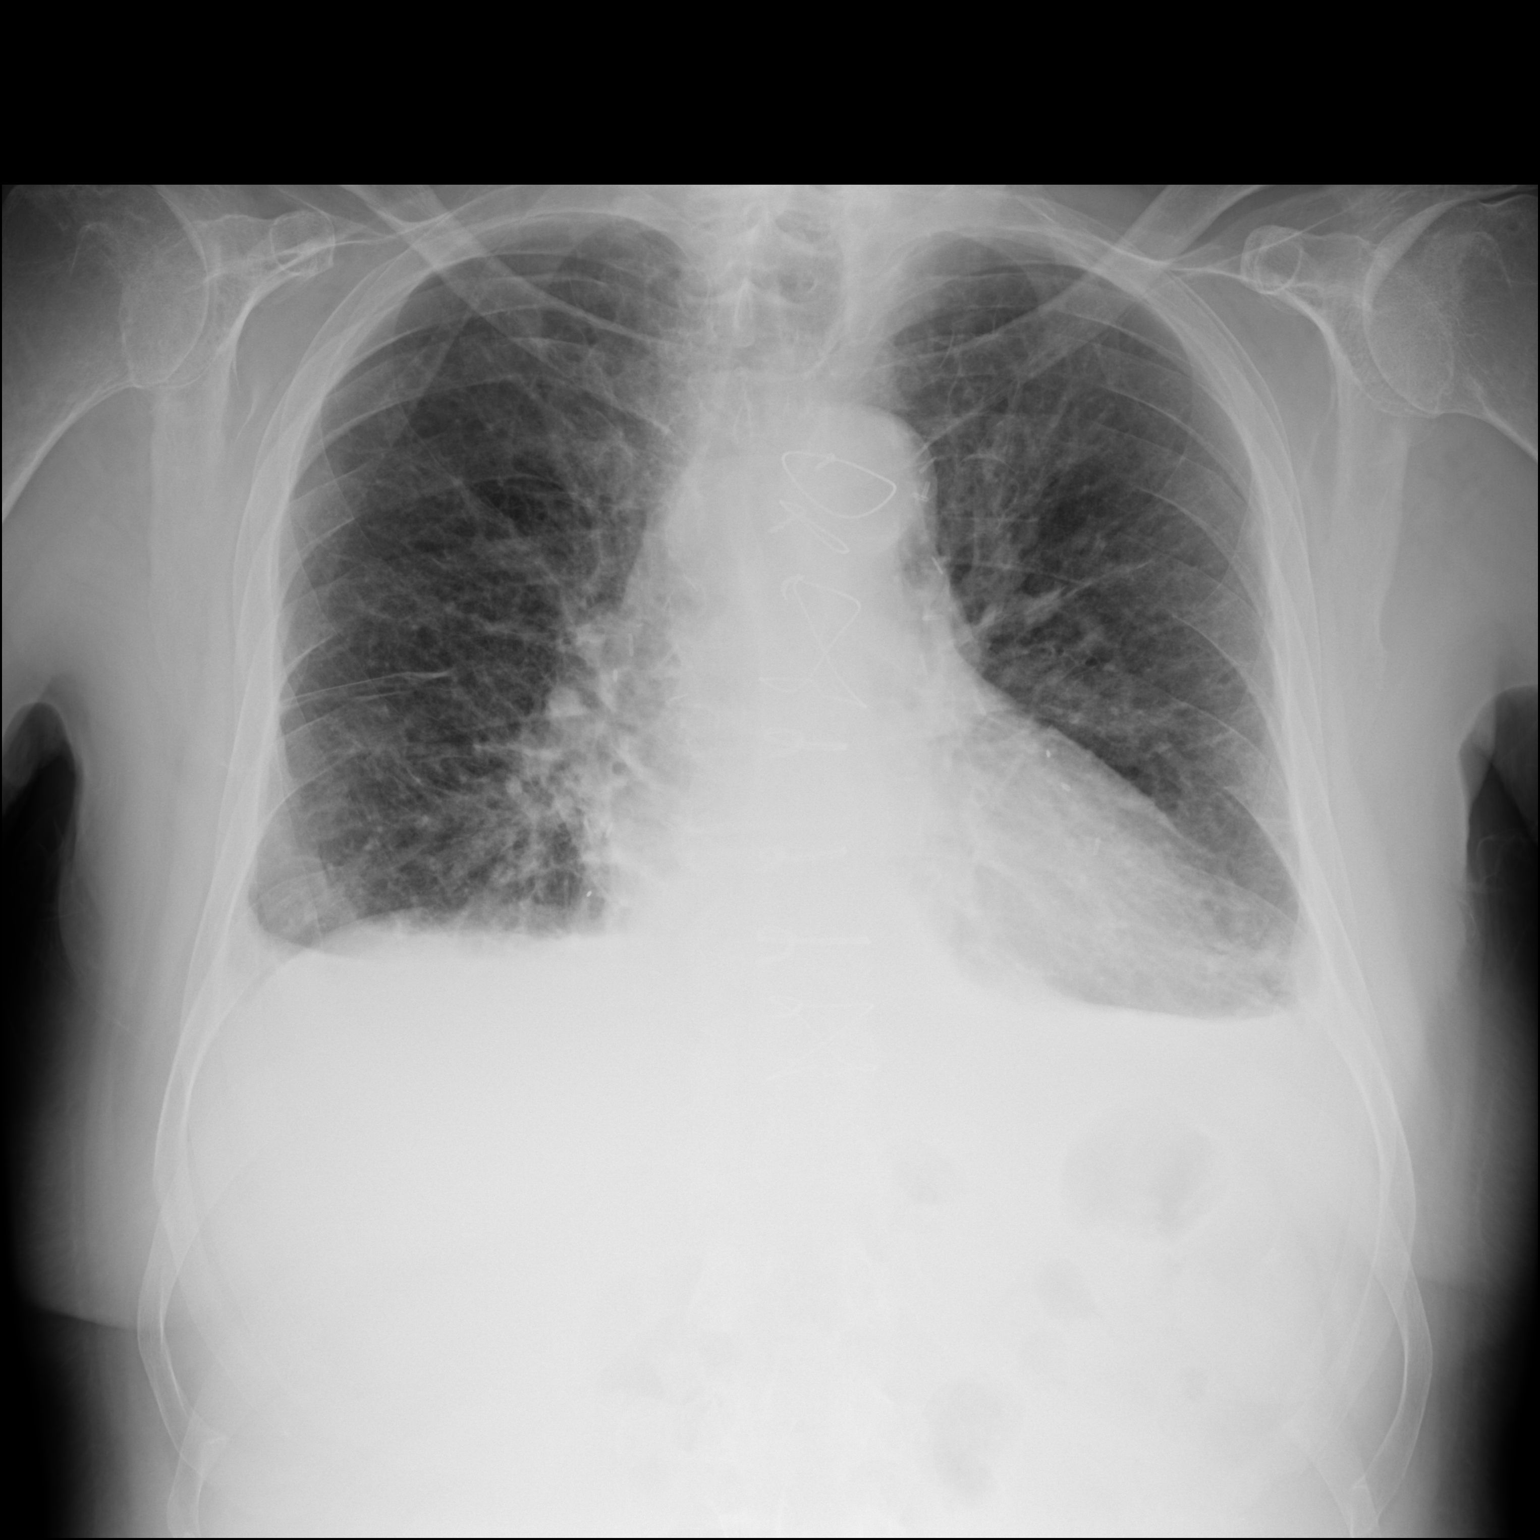

[dg chest 2 view (2 of 2)]
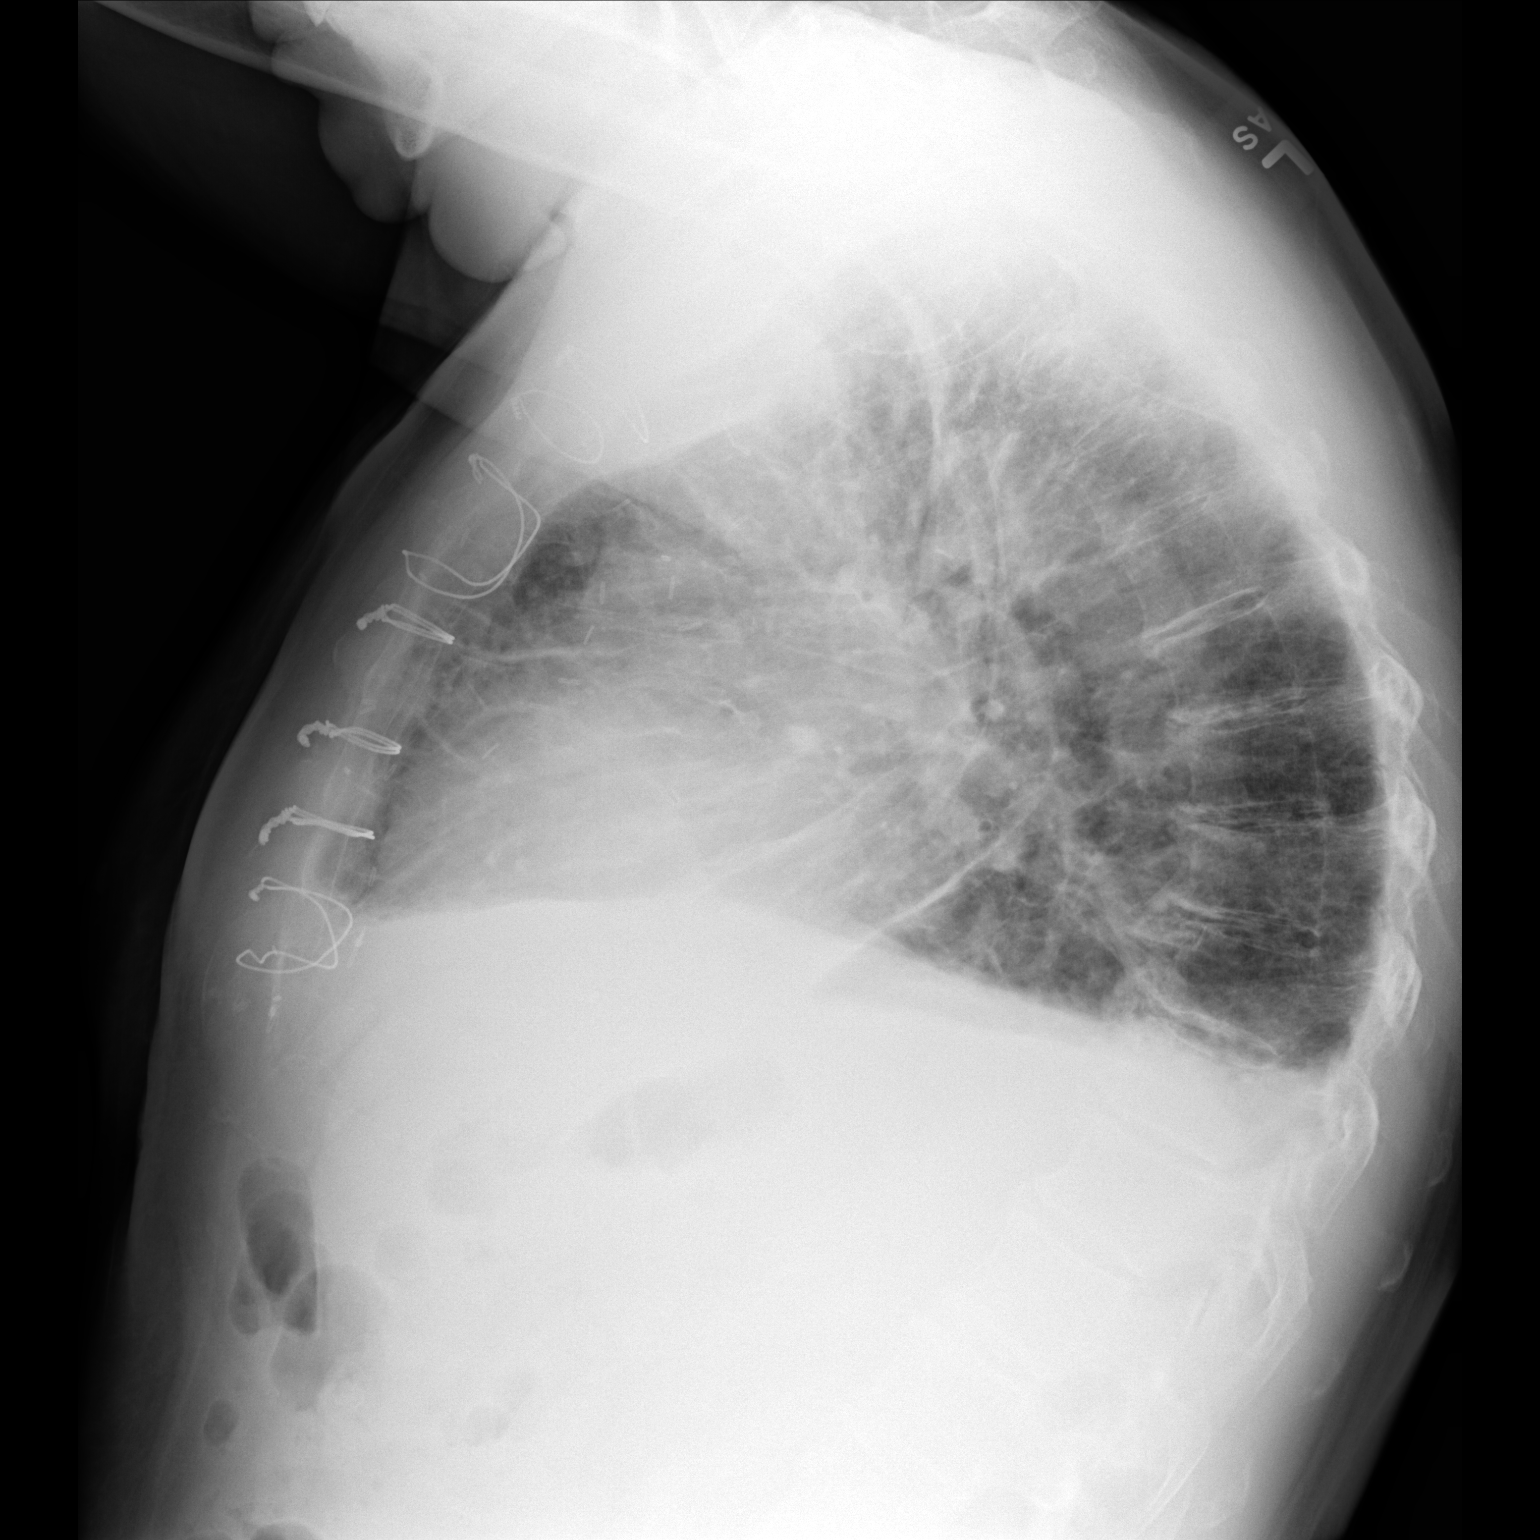

[2 of 2 positions shown; findings below may reference images not displayed]

FINDINGS: Stable postsurgical changes from CABG.

Enlarged cardiac silhouette.  Mediastinal contours appear intact.

Bilateral small to moderate pleural effusions. Bibasilar
peribronchial airspace consolidation versus atelectasis.

Osseous structures are without acute abnormality. Soft tissues are
grossly normal.
IMPRESSION: 1. Bilateral small to moderate pleural effusions.
2. Bibasilar peribronchial airspace consolidation versus
atelectasis.

## 2020-04-23 MED ORDER — POTASSIUM CHLORIDE CRYS ER 10 MEQ PO TBCR: 20.0000 meq | EXTENDED_RELEASE_TABLET | Freq: Two times a day (BID) | ORAL | Status: DC

## 2020-04-23 MED ORDER — FUROSEMIDE 80 MG PO TABS: 80.0000 mg | ORAL_TABLET | Freq: Every day | ORAL | 11 refills | Status: DC

## 2020-04-23 NOTE — Progress Notes (Signed)
[address]       [place] [address]             [phone]     CARDIOTHORACIC SURGERY OFFICE NOTE  Referring Provider is [name], MD Primary Cardiologist is Ena Dawley, MD PCP is Jani Gravel, MD   HPI:  75 year old man is status post CABG returns for complaints of shortness of breath.  This just started in the last few days.  He has been off Lasix for several days.  He denies fevers or chills.  He denies chest pain at rest or with exertion   Current Outpatient Medications  Medication Sig Dispense Refill  . acetaminophen (TYLENOL) 325 MG tablet Take 2 tablets (650 mg total) by mouth every 4 (four) hours as needed for headache or mild pain.    [name] amiodarone (PACERONE) 200 MG tablet Take 2 tablets (400 mg total) by mouth 2 (two) times daily. Until 3/13, then decrease to 200 mg daily 90 tablet 1  . Apoaequorin (PREVAGEN PO) Take 1 tablet by mouth at bedtime.    . carvedilol (COREG) 6.25 MG tablet TAKE 1 TABLET BY MOUTH 2 TIMES DAILY. 180 tablet 1  . cholecalciferol (VITAMIN D) 1000 UNITS tablet Take 1,000 Units by mouth See admin instructions. Mon-friday    . furosemide (LASIX) 80 MG tablet Take 1 tablet (80 mg total) by mouth daily. 30 tablet 11  . glucosamine-chondroitin 500-400 MG tablet Take 1 tablet by mouth. 5 times weekly    . lisinopril (ZESTRIL) 5 MG tablet Take 1 tablet (5 mg total) by mouth daily. 30 tablet 3  . loperamide (IMODIUM) 2 MG capsule Take 1 capsule (2 mg total) by mouth as needed for diarrhea or loose stools. 30 capsule 0  . metFORMIN (GLUCOPHAGE) 1000 MG tablet Take 1,000 mg by mouth 2 (two) times daily with a meal.     . Multiple Vitamin (MULTIVITAMIN WITH MINERALS) TABS tablet Take 1 tablet by mouth. 5 times weekly    . omeprazole (PRILOSEC) 20 MG capsule Take 20 mg by mouth daily.    . pioglitazone (ACTOS) 30 MG tablet Take 30 mg by mouth daily.    . pravastatin (PRAVACHOL) 80 MG tablet TAKE 1 TABLET (80 MG TOTAL) BY  MOUTH DAILY. 90 tablet 1  . tamsulosin (FLOMAX) 0.4 MG CAPS capsule Take 0.4 mg by mouth at bedtime.    . traMADol (ULTRAM) 50 MG tablet Take 1 tablet (50 mg total) by mouth every 4 (four) hours as needed for moderate pain. 30 tablet 0  . UNABLE TO FIND Take 1 tablet by mouth 2 (two) times a day. Study cholesterol drug if drug name needed please call April McCaskill at [phone]    . vitamin B-12 (CYANOCOBALAMIN) 1000 MCG tablet Take 1,000 mcg by mouth. 5 times a week     No current facility-administered medications for this visit.      Physical Exam:   BP 140/84   Pulse 100   Resp 20   Ht 6\' 1"  (1.854 m)   Wt 95.3 kg   SpO2 96% Comment: RA  BMI 27.71 kg/m   General:  Well-appearing no acute distress  Chest:   Diminished breath sounds at the bases  CV:   Regular rate and rhythm  Incisions:  Healing well  Abdomen:  Soft nontender  Extremities:  Mild edema  Diagnostic Tests:  I have personally reviewed his chest x-ray from today which demonstrates stable small bilateral pleural  effusions and increased pulmonary edema   Impression:  Mild heart failure status post CABG as evidenced by pulmonary edema on chest x-ray  Plan:  Follow-up as needed Lasix 80 mg p.o. daily Potassium 40 mEq daily I spent in excess of 15 minutes during the conduct of this office consultation and >50% of this time involved direct face-to-face encounter with the patient for counseling and/or coordination of their care.  Level 2                 10 minutes Level 3                 15 minutes Level 4                 25 minutes Level 5                 40 minutes  B.  Murvin Natal, MD 04/23/2020 4:21 PM   `

## 2020-04-27 ENCOUNTER — Telehealth: Payer: Self-pay | Admitting: Hematology

## 2020-04-27 ENCOUNTER — Other Ambulatory Visit: Payer: Self-pay

## 2020-04-27 ENCOUNTER — Inpatient Hospital Stay: Payer: Medicare HMO

## 2020-04-27 ENCOUNTER — Inpatient Hospital Stay: Payer: Medicare HMO | Attending: Hematology | Admitting: Hematology

## 2020-04-27 VITALS — BP 120/79 | HR 94 | Temp 97.7°F | Resp 16 | Ht 73.0 in | Wt 209.5 lb

## 2020-04-27 DIAGNOSIS — C8330 Diffuse large B-cell lymphoma, unspecified site: Secondary | ICD-10-CM

## 2020-04-27 DIAGNOSIS — C8335 Diffuse large B-cell lymphoma, lymph nodes of inguinal region and lower limb: Secondary | ICD-10-CM | POA: Diagnosis not present

## 2020-04-27 DIAGNOSIS — I428 Other cardiomyopathies: Secondary | ICD-10-CM | POA: Insufficient documentation

## 2020-04-27 DIAGNOSIS — E119 Type 2 diabetes mellitus without complications: Secondary | ICD-10-CM

## 2020-04-27 DIAGNOSIS — I10 Essential (primary) hypertension: Secondary | ICD-10-CM | POA: Diagnosis not present

## 2020-04-27 DIAGNOSIS — Z951 Presence of aortocoronary bypass graft: Secondary | ICD-10-CM | POA: Insufficient documentation

## 2020-04-27 DIAGNOSIS — Z87891 Personal history of nicotine dependence: Secondary | ICD-10-CM | POA: Diagnosis not present

## 2020-04-27 DIAGNOSIS — Z85820 Personal history of malignant melanoma of skin: Secondary | ICD-10-CM | POA: Diagnosis not present

## 2020-04-27 DIAGNOSIS — D649 Anemia, unspecified: Secondary | ICD-10-CM | POA: Insufficient documentation

## 2020-04-27 DIAGNOSIS — E785 Hyperlipidemia, unspecified: Secondary | ICD-10-CM

## 2020-04-27 DIAGNOSIS — I255 Ischemic cardiomyopathy: Secondary | ICD-10-CM | POA: Diagnosis not present

## 2020-04-27 DIAGNOSIS — I252 Old myocardial infarction: Secondary | ICD-10-CM

## 2020-04-27 DIAGNOSIS — K219 Gastro-esophageal reflux disease without esophagitis: Secondary | ICD-10-CM | POA: Diagnosis not present

## 2020-04-27 LAB — CMP (CANCER CENTER ONLY)
ALT: 12 U/L (ref 0–44)
AST: 12 U/L — ABNORMAL LOW (ref 15–41)
Albumin: 3.3 g/dL — ABNORMAL LOW (ref 3.5–5.0)
Alkaline Phosphatase: 81 U/L (ref 38–126)
Anion gap: 15 (ref 5–15)
BUN: 15 mg/dL (ref 8–23)
CO2: 25 mmol/L (ref 22–32)
Calcium: 8.2 mg/dL — ABNORMAL LOW (ref 8.9–10.3)
Chloride: 102 mmol/L (ref 98–111)
Creatinine: 1.13 mg/dL (ref 0.61–1.24)
GFR, Estimated: >60 mL/min (ref >60)
Glucose, Bld: 124 mg/dL — ABNORMAL HIGH (ref 70–99)
Potassium: 4.1 mmol/L (ref 3.5–5.1)
Sodium: 142 mmol/L (ref 135–145)
Total Bilirubin: 0.6 mg/dL (ref 0.3–1.2)
Total Protein: 7.1 g/dL (ref 6.5–8.1)

## 2020-04-27 LAB — HEPATITIS B CORE ANTIBODY, TOTAL: Hep B Core Total Ab: NONREACTIVE

## 2020-04-27 LAB — CBC WITH DIFFERENTIAL/PLATELET
Abs Immature Granulocytes: 0.02 10*3/uL (ref 0.00–0.07)
Basophils Absolute: 0.1 10*3/uL (ref 0.0–0.1)
Basophils Relative: 1 %
Eosinophils Absolute: 0.2 10*3/uL (ref 0.0–0.5)
Eosinophils Relative: 3 %
HCT: 33.8 % — ABNORMAL LOW (ref 39.0–52.0)
Hemoglobin: 10.7 g/dL — ABNORMAL LOW (ref 13.0–17.0)
Immature Granulocytes: 0 %
Lymphocytes Relative: 12 %
Lymphs Abs: 1 10*3/uL (ref 0.7–4.0)
MCH: 28.2 pg (ref 26.0–34.0)
MCHC: 31.7 g/dL (ref 30.0–36.0)
MCV: 89.2 fL (ref 80.0–100.0)
Monocytes Absolute: 0.5 10*3/uL (ref 0.1–1.0)
Monocytes Relative: 7 %
Neutro Abs: 6.4 10*3/uL (ref 1.7–7.7)
Neutrophils Relative %: 77 %
Platelets: 333 10*3/uL (ref 150–400)
RBC: 3.79 MIL/uL — ABNORMAL LOW (ref 4.22–5.81)
RDW: 14 % (ref 11.5–15.5)
WBC: 8.3 10*3/uL (ref 4.0–10.5)
nRBC: 0 % (ref 0.0–0.2)

## 2020-04-27 LAB — HIV ANTIBODY (ROUTINE TESTING W REFLEX): HIV Screen 4th Generation wRfx: NONREACTIVE

## 2020-04-27 LAB — LACTATE DEHYDROGENASE: LDH: 171 U/L (ref 98–192)

## 2020-04-27 LAB — HEPATITIS C ANTIBODY: HCV Ab: NONREACTIVE

## 2020-04-27 LAB — ABO/RH: ABO/RH(D): A NEG

## 2020-04-27 LAB — HEPATITIS B SURFACE ANTIGEN: Hepatitis B Surface Ag: NONREACTIVE

## 2020-04-27 NOTE — Patient Instructions (Signed)
Thank you for choosing [facility] to provide your oncology and hematology care.   Should you have questions after your visit to the [facility] ([facility]), please contact this office at [phone] between 8:30 AM and 4:30 PM.  Voice mails left after 4:00 PM may not be returned until the following business day.  Calls received after 4:30 PM will be answered by an off-site Nurse Triage Line.    Prescription Refills:  Please have your pharmacy contact us directly for most prescription requests.  Contact the office directly for refills of narcotics (pain medications). Allow 48-72 hours for refills.  Appointments: Please contact the [facility] scheduling department [phone] for questions regarding [facility] appointment scheduling.  Contact the schedulers with any scheduling changes so that your appointment can be rescheduled in a timely manner.   Central Scheduling for [facility] [phone] - Call to schedule procedures such as PET scans, CT scans, MRI, Ultrasound, etc.  To afford each patient quality time with our providers, please arrive 30 minutes before your scheduled appointment time.  If you arrive late for your appointment, you may be asked to reschedule.  We strive to give you quality time with our providers, and arriving late affects you and other patients whose appointments are after yours. If you are a no show for multiple scheduled visits, you may be dismissed from the clinic at the providers discretion.     Resources: [facility] Social Workers [phone] for additional information on assistance programs or assistance connecting with community support programs   [place] DSS  [phone]: Information regarding food stamps, Medicaid, and utility assistance [place] [employer] [place] [phone]   [employer] [employer]'s shared-ride transportation service for eligible riders who have a disability that prevents them from riding the fixed route bus.   Medicare  Rights Center 800-333-4114 Helps people with Medicare understand their rights and benefits, navigate the Medicare system, and secure the quality healthcare they deserve [employer] 800-227-2345 Assists patients locate various types of support and financial assistance Cancer Care: 1-800-813-HOPE (4673) Provides financial assistance, online support groups, medication/co-pay assistance.   Transportation Assistance for appointments at [facility]: Transportation Coordinator [phone]  Again, thank you for choosing [facility] for your care.       

## 2020-04-27 NOTE — Telephone Encounter (Signed)
Scheduled follow-up appointment per 4/4 los. Patient's wife is aware.

## 2020-04-27 NOTE — Progress Notes (Signed)
[name]    HEMATOLOGY/ONCOLOGY CONSULTATION NOTE  Date of Service: 04/27/2020  Patient Care Team: Jani Gravel, MD as PCP - General (Internal Medicine) Dorothy Spark, MD as PCP - Cardiology (Cardiology)  CHIEF COMPLAINTS/PURPOSE OF CONSULTATION:  Primary testicular large B cell lymphoma  HISTORY OF PRESENTING ILLNESS:   Roy [name] is a wonderful 75 y.o. male who has been referred to [place] by Dr Maudie Mercury for evaluation and management of primary testicular large B-cell lymphoma  Patient has a history of hypertension, borderline diabetes, previous nonischemic cardiomyopathy but with a recent MI,, CHF who presented with a left testicular swelling end of sept/ early oct 2021.  He notes that he was seen by Urology - Dr Minette Brine and had an ultrasound--possible infection was suspected and he received 2 rounds of abx without significant improvement.  He notes he received 2 subsequent follow-up ultrasounds in December in January and finally had left transinguinal orchiectomy on 03/11/2020. He notes he had persistent testicular swelling post operatively which required drainage.  He also notes having issues with urinary retention and had a urinary catheter for 2 weeks, abx+  Pathology showed primary testicular large B-cell lymphoma.  No other additional imaging studies for staging have been done at this time.  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  Patient was admitted to the hospital on 03/21/2020 with acute onset dyspnea epigastric pain and cough with pink frothy sputum.  He was noted to have hypoxia with oxygen saturation of 82% on arrival.  He was noted to have non-ST elevation MI on 2/26.  CTA chest ruled out PE.  His symptoms from fluid with aggressive diuresis.  He was noted to have severe triple-vessel disease on cath with an ejection fraction of 25 to 35% with no significant valvular lesions.  He subsequently had CORONARY ARTERY BYPASS  GRAFTING (CABG), ON PUMP, TIMES FOUR, USING BILATERAL INTERNAL MAMMARY ARTERIES AND LEFT RADIAL ARTERY .. On March 25, 2020.  Patient notes he is gradually recovering from his cardiac surgery notes being fatigued having gone through multiple urologic issues, skin surgery for squamous of carcinoma of the nose, acute MI CABG x4.  Patient notes no fevers no chills no night sweats. He notes he might have lost some weight but difficult to quantify in the setting of fluid overload and diuresis. Notes he is starting to eat a little better. Minimal left scrotal swelling. No other acute new focal symptoms.  MEDICAL HISTORY:  Past Medical History:  Diagnosis Date  . Allergic rhinitis   . Arthritis    wrists  . Cardiomyopathy, nonischemic [facility])    followed by cardiology--- dr [name]---  2016 ef 45-50% ,  2016 nuclear ef 37%,  2017 per echo ef 50-55%  . CHF (congestive heart failure), NYHA class III ([place]) 03/21/2020  . GERD (gastroesophageal reflux disease)   . Hiatal hernia   . History of kidney stones   . 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 History of syncope (03-06-2020 pt stated has not had sycopal episode in few years, stated it seems to happen in extreme hot conditions)   cardiologist--- dr [name]--- dx recurrent syncope;  nuclear study 11-03-2014 intermediate risk w/ no ischemia, apical hypokinesis, nuclear ef 37%;  event monitor-- 12-21-2015 SB/ ST  no pauses/ arrythmia's;  echo 08-03-2015 ef 50-55%  . Hyperlipidemia   .  Hypertension    followed by pcp  . Hypovitaminosis D   . Mass of left testicle   . Nocturia   . Plantar fasciitis   . Presence of surgical incision    01/ 2022  moh's w/ skin graft of nose, per pt still healing and wear bandage daily  . Type 2 diabetes mellitus ([place])    pt is adament that he is not and have been told he is a diabetic but a borderline;  followed by pcp, in pcp note  states DM2 and takes 2 meds daily  . Wears glasses   . Wears hearing aid in both ears     SURGICAL HISTORY: Past Surgical History:  Procedure Laterality Date  . COLONOSCOPY  last one 01-30-2017  . CORONARY ARTERY BYPASS GRAFT N/A 03/25/2020   Procedure: CORONARY ARTERY BYPASS GRAFTING (CABG), ON PUMP, TIMES FOUR, USING BILATERAL INTERNAL MAMMARY ARTERIES AND LEFT RADIAL ARTERY;  Surgeon: [name], MD;  Location: [place];  Service: Open Heart Surgery;  Laterality: N/A;  . LOW ANTERIOR RESECTION RECTUM W/ COLOPROCTOSTOMY  05/2003  . MOHS SURGERY  01/2020   nose w/ graft  . ORCHIECTOMY Left 03/11/2020   Procedure: [place];  Surgeon: [name], MD;  [facility];  Service: Urology;  Laterality: Left;  ONLY NEEDS 60 MIN  . RADIAL ARTERY HARVEST Left 03/25/2020   Procedure: LEFT RADIAL ARTERY HARVEST;  Surgeon: [name], MD;  Location: [place];  Service: Open Heart Surgery;  Laterality: Left;  . RIGHT/LEFT HEART CATH AND CORONARY ANGIOGRAPHY N/A 03/23/2020   Procedure: RIGHT/LEFT HEART CATH AND CORONARY ANGIOGRAPHY;  Surgeon: [name], MD;  Location: [place] [facility];  Service: Cardiovascular;  Laterality: N/A;  . SHOULDER SURGERY Right 1992; 07/ 2021  . 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 TEE WITHOUT CARDIOVERSION N/A 03/25/2020   Procedure: TRANSESOPHAGEAL ECHOCARDIOGRAM (TEE);  Surgeon: [name], MD;  Location: [place];  Service: Open Heart Surgery;  Laterality: N/A;  . TONSILLECTOMY AND ADENOIDECTOMY  child  . UPPER GASTROINTESTINAL ENDOSCOPY  last one 06-06-2017    SOCIAL HISTORY: Social History   Socioeconomic History  . Marital status: Married    Spouse name: Not on file  . Number of children: Not on file  . Years of education: Not on file  . Highest education level: Not on file  Occupational History  . Not on file  Tobacco Use  . Smoking  status: Former Smoker    Years: 5.00    Types: Pipe, Cigars    Quit date: 11/29/1976    Years since quitting: 43.4  . Smokeless tobacco: Never Used  Vaping Use  . Vaping Use: Never used  Substance and Sexual Activity  . Alcohol use: Yes    Alcohol/week: 0.0 standard drinks    Comment: Occasional drink   . Drug use: Never  . Sexual activity: Not on file  Other Topics Concern  . Not on file  Social History Narrative  . Not on file   Social Determinants of Health   Financial Resource Strain: Not on file  Food Insecurity: Not on file  Transportation Needs: Not on file  Physical Activity: Not on file  Stress: Not on file  Social Connections: Not on file  Intimate Partner Violence: Not on file    FAMILY HISTORY: Family History  Problem Relation Age of Onset  . Heart attack  Mother   . Stroke Father   . Colon cancer Neg Hx   . Esophageal cancer Neg Hx   . Pancreatic cancer Neg Hx   . Rectal cancer Neg Hx   . Stomach cancer Neg Hx   . Colon polyps Neg Hx     ALLERGIES:  has No Known Allergies.  MEDICATIONS:  Current Outpatient Medications  Medication Sig Dispense Refill  . Apoaequorin (PREVAGEN PO) Take 1 tablet by mouth at bedtime.    [name] aspirin EC 81 MG tablet Take 81 mg by mouth daily. Swallow whole.    . carvedilol (COREG) 6.25 MG tablet TAKE 1 TABLET BY MOUTH 2 TIMES DAILY. 180 tablet 1  . cholecalciferol (VITAMIN D) 1000 UNITS tablet Take 1,000 Units by mouth See admin instructions. Mon-friday    . furosemide (LASIX) 80 MG tablet Take 1 tablet (80 mg total) by mouth daily. 30 tablet 11  . glucosamine-chondroitin 500-400 MG tablet Take 1 tablet by mouth. 5 times weekly    . lisinopril (ZESTRIL) 5 MG tablet Take 1 tablet (5 mg total) by mouth daily. 30 tablet 3  . metFORMIN (GLUCOPHAGE) 1000 MG tablet Take 1,000 mg by mouth 2 (two) times daily with a meal.     . Multiple Vitamin (MULTIVITAMIN WITH MINERALS) TABS tablet Take 1 tablet by mouth. 5 times weekly    .  omeprazole (PRILOSEC) 20 MG capsule Take 20 mg by mouth daily.    . pioglitazone (ACTOS) 30 MG tablet Take 30 mg by mouth daily.    . pravastatin (PRAVACHOL) 80 MG tablet TAKE 1 TABLET (80 MG TOTAL) BY MOUTH DAILY. 90 tablet 1  . tamsulosin (FLOMAX) 0.4 MG CAPS capsule Take 0.4 mg by mouth at bedtime.    [name] UNABLE TO FIND Take 1 tablet by mouth 2 (two) times a day. Study cholesterol drug if drug name needed please call April McCaskill at [phone]    . vitamin B-12 (CYANOCOBALAMIN) 1000 MCG tablet Take 1,000 mcg by mouth. 5 times a week    . acetaminophen (TYLENOL) 325 MG tablet Take 2 tablets (650 mg total) by mouth every 4 (four) hours as needed for headache or mild pain. (Patient not taking: Reported on 04/27/2020)    . amiodarone (PACERONE) 200 MG tablet Take 2 tablets (400 mg total) by mouth 2 (two) times daily. Until 3/13, then decrease to 200 mg daily (Patient not taking: Reported on 04/27/2020) 90 tablet 1  . COVID-19 mRNA vaccine, Pfizer, 30 MCG/0.3ML injection USE AS DIRECTED (Patient not taking: Reported on 04/27/2020) .3 mL 0  . loperamide (IMODIUM) 2 MG capsule Take 1 capsule (2 mg total) by mouth as needed for diarrhea or loose stools. (Patient not taking: Reported on 04/27/2020) 30 capsule 0  . traMADol (ULTRAM) 50 MG tablet Take 1 tablet (50 mg total) by mouth every 4 (four) hours as needed for moderate pain. 30 tablet 0   Current Facility-Administered Medications  Medication Dose Route Frequency Provider Last Rate Last Admin  . potassium chloride (KLOR-CON) CR tablet 20 mEq  20 mEq Oral BID [name], MD        REVIEW OF SYSTEMS:    10 Point review of Systems was done is negative except as noted above.  PHYSICAL EXAMINATION: ECOG PERFORMANCE STATUS: 2 - Symptomatic, <50% confined to bed  . Vitals:   04/27/20 1134  BP: 120/79  Pulse: 94  Resp: 16  Temp: 97.7 F (36.5 C)  SpO2: 100%   Filed Weights  04/27/20 1134  Weight: 209 lb 8 oz (95 kg)   .Body mass index  is 27.64 kg/m.  GENERAL:alert, in no acute distress and comfortable SKIN: no acute rashes, no significant lesions EYES: conjunctiva are pink and non-injected, sclera anicteric OROPHARYNX: MMM, no exudates, no oropharyngeal erythema or ulceration NECK: supple, no JVD LYMPH:  no palpable lymphadenopathy in the cervical, axillary or inguinal regions LUNGS: clear to auscultation b/l with normal respiratory effort, healing incision of CABG. HEART: regular rate & rhythm ABDOMEN:  normoactive bowel sounds , non tender, not distended.  Healed left inguinal incision and some mild scrotal swelling on the left side Extremity: Trace pedal edema PSYCH: alert & oriented x 3 with fluent speech NEURO: no focal motor/sensory deficits  LABORATORY DATA:  I have reviewed the data as listed  . CBC Latest Ref Rng & Units 04/27/2020 04/23/2020 03/31/2020  WBC 4.0 - 10.5 K/uL 8.3 10.0 10.6(H)  Hemoglobin 13.0 - 17.0 g/dL 10.7(L) 10.4(L) 9.3(L)  Hematocrit 39.0 - 52.0 % 33.8(L) 32.8(L) 28.2(L)  Platelets 150 - 400 K/uL 333 346 312    . CMP Latest Ref Rng & Units 04/27/2020 04/23/2020 04/01/2020  Glucose 70 - 99 mg/dL 124(H) 145(H) 117(H)  BUN 8 - 23 mg/dL $Remove'15 15 10  'HpASzMf$ Creatinine 0.61 - 1.24 mg/dL 1.13 1.17 1.17  Sodium 135 - 145 mmol/L 142 140 133(L)  Potassium 3.5 - 5.1 mmol/L 4.1 4.4 4.6  Chloride 98 - 111 mmol/L 102 105 103  CO2 22 - 32 mmol/L 25 25 20(L)  Calcium 8.9 - 10.3 mg/dL 8.2(L) 8.6 7.9(L)  Total Protein 6.5 - 8.1 g/dL 7.1 - -  Total Bilirubin 0.3 - 1.2 mg/dL 0.6 - -  Alkaline Phos 38 - 126 U/L 81 - -  AST 15 - 41 U/L 12(L) - -  ALT 0 - 44 U/L 12 - -   . Lab Results  Component Value Date   LDH 171 04/27/2020   SURGICAL PATHOLOGY   THIS IS AN ADDENDUM REPORT   CASE: [id number]  PATIENT: Eros Steidle  Surgical Pathology Report  Addendum    Reason for Addendum #1: Molecular Genetic Test Results, FISH   Clinical History: Left testicular mass (crm)      FINAL MICROSCOPIC DIAGNOSIS:    A. TESTICLE, LEFT, ORCHIECTOMY:  - Diffuse aggressive large B-cell lymphoma  - See comment    COMMENT:   Sections of testicle show architectural effacement by sheets of large  lymphoid cells with vesicular nuclei and pale cytoplasm. There is  admixed apoptotic debris and increased mitotic activity. By  immunohistochemistry, the large lymphoid cells are positive for CD20,  CD5 (dim), BCL6 (dim), MUM1, and BCL2. They are negative for CD10, CD30  (<1%), cyclin D1, and TdT. The Ki67 proliferation index is up to  approximately 70%. CD3 highlights small T-cells in the background. EBV  is negative by in situ hybridization.   Together, the findings support the diagnosis of a diffuse aggressive  large B-cell lymphoma. The differential diagnosis includes a diffuse  large B-cell lymphoma, NOS, with activated B-cell subtype by the [facility]  algorithm and high-grade B-cell lymphoma with MYC and BCL2 or BCL6  rearrangements. FISH for BCL2, BCL6, and MYC rearrangements will be  performed. Correlation with clinical and radiographic findings is  recommended for consideration of a primary testicular lymphoma.   Result reported to L. Gibson on 03/16/20 at 1720 by S. O'Neill.    ADDENDUM:   FISH RESULTS:   Results: NORMAL   Interpretation:  BCL6 rearrangement:   Not Detected  MYC rearrangement: Not Detected  MYC amplification: Not Detected  BCL6 rearrangement:   Not Detected   ** Please note this testing was performed and interpreted by an outside  facility ([facility]). This addendum is only being added to provide a  summary of the results for report completeness. Please see electronic  medical record for a copy of the full report.**    RADIOGRAPHIC STUDIES: I have personally reviewed the radiological images as listed and agreed with the findings in the report. DG Chest 2 View  Result Date: 04/23/2020 CLINICAL DATA:  Pleurisy. EXAM: CHEST - 2 VIEW COMPARISON:  April 13, 2020 FINDINGS: Stable postsurgical changes from CABG. Enlarged cardiac silhouette.  Mediastinal contours appear intact. Bilateral small to moderate pleural effusions. Bibasilar peribronchial airspace consolidation versus atelectasis. Osseous structures are without acute abnormality. Soft tissues are grossly normal. IMPRESSION: 1. Bilateral small to moderate pleural effusions. 2. Bibasilar peribronchial airspace consolidation versus atelectasis. Electronically Signed   By: Fidela Salisbury M.D.   On: 04/23/2020 16:01   DG Chest 2 View  Result Date: 04/13/2020 CLINICAL DATA:  75 year old male status post CABG earlier this month. EXAM: CHEST - 2 VIEW COMPARISON:  03/31/2020 and earlier. FINDINGS: Increased AP dimension to the chest. Larger lung volumes. Stable cardiac size and mediastinal contours. Persistent small bilateral pleural effusions, not significantly changed. No pneumothorax or pulmonary edema. No consolidation. Stable visualized osseous structures. Negative visible bowel gas pattern. IMPRESSION: Persistent small pleural effusions. Larger lung volumes. No pulmonary edema. Electronically Signed   By: Genevie Ann M.D.   On: 04/13/2020 09:19   DG Chest 2 View  Result Date: 03/31/2020 CLINICAL DATA:  75 year old male postoperative day 6 status post CABG. EXAM: CHEST - 2 VIEW COMPARISON:  Portable chest 03/30/2020 and earlier. FINDINGS: PA and lateral views today. Right IJ introducer sheath removed. Epicardial pacer wires remain in place. Stable cardiac size and mediastinal contours. Mildly low lung volumes. No pneumothorax. Small bilateral layering pleural effusions. Mild perihilar atelectasis. No acute pulmonary edema. Sternotomy. No acute osseous abnormality identified. Negative visible bowel gas pattern. IMPRESSION: 1. Right IJ introducer sheath removed. 2. Small bilateral pleural effusions and mild atelectasis. Electronically Signed   By: Genevie Ann M.D.   On: 03/31/2020 07:15   DG Chest Port 1  View  Result Date: 03/30/2020 CLINICAL DATA:  Chest pain, open heart surgery EXAM: PORTABLE CHEST 1 VIEW COMPARISON:  03/29/2020 FINDINGS: Right internal jugular Cordis introducer is unchanged. Pulmonary insufflation has diminished and low lung volumes are present. Lungs, however, are clear. No pneumothorax or pleural effusion. Coronary artery bypass grafting has been performed. IMPRESSION: Pulmonary hypoinflation. Resolved right apical pneumothorax. Electronically Signed   By: Fidela Salisbury MD   On: 03/30/2020 06:27   DG Chest Port 1 View  Result Date: 03/29/2020 CLINICAL DATA:  Status post CABG.  History of CHF. EXAM: PORTABLE CHEST 1 VIEW COMPARISON:  March 28, 2020 FINDINGS: The right IJ sheath and left chest tube are stable. No left-sided pneumothorax. There is a tiny right-sided pneumothorax which is in retrospect unchanged since March 28, 2020 but not visualized March 27, 2020. Mild increased interstitial opacities. Stable cardiomegaly. The hila and mediastinum are unchanged. IMPRESSION: 1. There is a tiny right apical pneumothorax measuring 3 mm which is, in retrospect, unchanged since March 28, 2020 but was not visualized March 27, 2020. 2. Mild increased interstitial markings in the lungs could represent pulmonary venous congestion/mild edema. These results will be called to  the ordering clinician or representative by the Radiologist Assistant, and communication documented in the PACS or [employer]. Electronically Signed   By: Dorise Bullion III M.D   On: 03/29/2020 07:48    ASSESSMENT & PLAN:   75 year old wonderful gentleman with history of hypertension, borderline diabetes, dyslipidemia, GERD with recent STEMI on 03/21/2020 and status post four-vessel CABG on 03/25/2020.  1) Left-sided primary testicular large B-cell lymphoma. Staging to be determined. BCL-2, BCL 6, c-Myc negative.  2) recent acute myocardial infarction status post CABG x4 3) nonischemic and ischemic cardiomyopathy  ejection fraction 25 to 30% on last echo. 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 -We reviewed the pathology and his new diagnosis of primary testicular large B-cell lymphoma in details and discussed the natural history, treatment, prognosis and answered several of his questions regarding this. -We discussed the limitations on treatment imposed by his significant cardiac comorbidities such as the need to avoid anthracyclines concerns with ability to handle IV fluids. -We also discussed need to have optimal plan for diabetes control in the context of using steroids. -We discussed that the typical treatment plan tends to involve up to 6 cycles of chemoimmunotherapy, intrathecal methotrexate for CNS prophylaxis, radiation to contralateral testicle. -We discussed that even with limited stage disease this tends to be an aggressive entity and he seems to had symptoms from this since September/October last year. -PET CT scan for staging -Labs for initial assessment -Repeat echo to evaluate current cardiac function, treatment planning and fluid management standpoint. -He has follow-up with primary, urology and cardiology for continued management of other medical issues.  . Orders Placed This Encounter  Procedures  . NM PET Image Initial (PI) Skull Base To Thigh    Standing Status:   Future    Standing Expiration Date:   04/27/2021    Order Specific Question:   If indicated for the ordered procedure, I authorize the administration of a radiopharmaceutical per Radiology protocol    Answer:   Yes    Order Specific Question:   Preferred imaging location?    Answer:   [facility]  . CBC with Differential/Platelet    Standing Status:   Future    Number of Occurrences:   1    Standing Expiration Date:   04/27/2021  .  CMP ([facility] only)    Standing Status:   Future    Number of Occurrences:   1    Standing Expiration Date:   04/27/2021  . Lactate dehydrogenase    Standing Status:   Future    Number of Occurrences:   1    Standing Expiration Date:   04/27/2021  . Hepatitis C antibody    Standing Status:   Future    Number of Occurrences:   1    Standing Expiration Date:   04/27/2021  . HIV Antibody (routine testing w rflx)    Standing Status:   Future    Number of Occurrences:   1    Standing Expiration Date:   04/27/2021  . Hepatitis B surface antigen    Standing Status:   Future    Number of Occurrences:   1    Standing Expiration Date:   04/27/2021  . Hepatitis B core antibody, total    Standing Status:   Future  Number of Occurrences:   1    Standing Expiration Date:   04/27/2021  . ECHOCARDIOGRAM COMPLETE    Standing Status:   Future    Standing Expiration Date:   04/27/2021    Order Specific Question:   Where should this test be performed    Answer:   [facility]    Order Specific Question:   Perflutren DEFINITY (image enhancing agent) should be administered unless hypersensitivity or allergy exist    Answer:   Administer Perflutren    Order Specific Question:   Reason for exam-Echo    Answer:   Chemo  Z09    Order Specific Question:   Reason for exam-Echo    Answer:   Myocardial Infact I21,9  . ABO/RH    Standing Status:   Future    Number of Occurrences:   1    Standing Expiration Date:   04/27/2021   FOLLOWUP Labs today PET/CT in 1 week ECHO In 1 week RTC with Dr Irene Limbo in abotu 2 weeks (immediately post PAL)  All of the patients questions were answered with apparent satisfaction. The patient knows to call the clinic with any problems, questions or concerns.  I spent 55 minutes counseling the patient face to face. The total time spent in the appointment was 80 minutes and more than 50% was on counseling and direct patient cares.    Sullivan Lone MD [place] AAHIVMS [facility]  [facility] Hematology/Oncology Physician [facility]  (Office):       [phone] (Work cell):  [phone] (Fax):           [phone]  04/27/2020 11:59 AM

## 2020-04-30 ENCOUNTER — Encounter: Payer: Medicare HMO | Attending: Cardiovascular Disease

## 2020-04-30 ENCOUNTER — Other Ambulatory Visit: Payer: Self-pay

## 2020-04-30 DIAGNOSIS — Z951 Presence of aortocoronary bypass graft: Secondary | ICD-10-CM | POA: Insufficient documentation

## 2020-04-30 NOTE — Progress Notes (Signed)
Virtual Visit completed. Patient informed on EP and RD appointment and 6 Minute walk test. Patient also informed of patient health questionnaires on My Chart. Patient Verbalizes understanding. Visit diagnosis can be found in [facility] 03/21/2020.

## 2020-05-04 ENCOUNTER — Other Ambulatory Visit: Payer: Self-pay

## 2020-05-04 VITALS — Ht 72.0 in | Wt 203.5 lb

## 2020-05-04 DIAGNOSIS — Z951 Presence of aortocoronary bypass graft: Secondary | ICD-10-CM

## 2020-05-04 NOTE — Progress Notes (Signed)
Cardiac Individual Treatment Plan  Patient Details  Name: Roy [name] MRN: [id number] Date of Birth: 10/20/1945 Referring Provider:   Flowsheet Row Cardiac Rehab from 05/04/2020 in [facility] Cardiac and Pulmonary Rehab  Referring Provider [name] MD      Initial Encounter Date:  Flowsheet Row Cardiac Rehab from 05/04/2020 in [facility] Cardiac and Pulmonary Rehab  Date 05/04/20      Visit Diagnosis: S/P CABG x 4  Patient's Home Medications on Admission:  Current Outpatient Medications:  .  acetaminophen (TYLENOL) 325 MG tablet, Take 2 tablets (650 mg total) by mouth every 4 (four) hours as needed for headache or mild pain. (Patient not taking: No sig reported), Disp: , Rfl:  .  Apoaequorin (PREVAGEN PO), Take 1 tablet by mouth at bedtime., Disp: , Rfl:  .  aspirin EC 81 MG tablet, Take 81 mg by mouth daily. Swallow whole., Disp: , Rfl:  .  carvedilol (COREG) 6.25 MG tablet, TAKE 1 TABLET BY MOUTH 2 TIMES DAILY., Disp: 180 tablet, Rfl: 1 .  cholecalciferol (VITAMIN D) 1000 UNITS tablet, Take 1,000 Units by mouth See admin instructions. Mon-friday, Disp: , Rfl:  .  COVID-19 mRNA vaccine, Pfizer, 30 MCG/0.3ML injection, USE AS DIRECTED (Patient not taking: No sig reported), Disp: .3 mL, Rfl: 0 .  furosemide (LASIX) 80 MG tablet, Take 1 tablet (80 mg total) by mouth daily., Disp: 30 tablet, Rfl: 11 .  glucosamine-chondroitin 500-400 MG tablet, Take 1 tablet by mouth. 5 times weekly, Disp: , Rfl:  .  lisinopril (ZESTRIL) 5 MG tablet, Take 1 tablet (5 mg total) by mouth daily., Disp: 30 tablet, Rfl: 3 .  loperamide (IMODIUM) 2 MG capsule, Take 1 capsule (2 mg total) by mouth as needed for diarrhea or loose stools. (Patient not taking: No sig reported), Disp: 30 capsule, Rfl: 0 .  metFORMIN (GLUCOPHAGE) 1000 MG tablet, Take 1,000 mg by mouth 2 (two) times daily with a meal. , Disp: , Rfl:  .  Multiple Vitamin (MULTIVITAMIN WITH MINERALS) TABS tablet, Take 1 tablet by mouth. 5 times weekly, Disp:  , Rfl:  .  omeprazole (PRILOSEC) 20 MG capsule, Take 20 mg by mouth daily., Disp: , Rfl:  .  pioglitazone (ACTOS) 30 MG tablet, Take 30 mg by mouth daily., Disp: , Rfl:  .  pravastatin (PRAVACHOL) 80 MG tablet, TAKE 1 TABLET (80 MG TOTAL) BY MOUTH DAILY., Disp: 90 tablet, Rfl: 1 .  tamsulosin (FLOMAX) 0.4 MG CAPS capsule, Take 0.4 mg by mouth at bedtime., Disp: , Rfl:  .  traMADol (ULTRAM) 50 MG tablet, Take 1 tablet (50 mg total) by mouth every 4 (four) hours as needed for moderate pain. (Patient not taking: No sig reported), Disp: 30 tablet, Rfl: 0 .  UNABLE TO FIND, Take 1 tablet by mouth 2 (two) times a day. Study cholesterol drug if drug name needed please call April McCaskill at [phone], Disp: , Rfl:  .  vitamin B-12 (CYANOCOBALAMIN) 1000 MCG tablet, Take 1,000 mcg by mouth. 5 times a week, Disp: , Rfl:   Current Facility-Administered Medications:  .  potassium chloride (KLOR-CON) CR tablet 20 mEq, 20 mEq, Oral, BID, Atkins, Glenice Bow, MD  Past Medical History: Past Medical History:  Diagnosis Date  . Allergic rhinitis   . Arthritis    wrists  . Cardiomyopathy, nonischemic [facility])    followed by cardiology--- dr [name]---  2016 ef 45-50% ,  2016 nuclear ef 37%,  2017 per echo ef 50-55%  . CHF (congestive heart  failure), NYHA class III ([place]) 03/21/2020  . GERD (gastroesophageal reflux disease)   . Hiatal hernia   . History of kidney stones   . 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 History of syncope (03-06-2020 pt stated has not had sycopal episode in few years, stated it seems to happen in extreme hot conditions)   cardiologist--- dr [name]--- dx recurrent syncope;  nuclear study 11-03-2014 intermediate risk w/ no ischemia, apical hypokinesis, nuclear ef 37%;  event monitor-- 12-21-2015 SB/ ST  no pauses/ arrythmia's;  echo 08-03-2015 ef 50-55%  . Hyperlipidemia   . Hypertension    followed by  pcp  . Hypovitaminosis D   . Mass of left testicle   . Nocturia   . Plantar fasciitis   . Presence of surgical incision    01/ 2022  moh's w/ skin graft of nose, per pt still healing and wear bandage daily  . Type 2 diabetes mellitus ([place])    pt is adament that he is not and have been told he is a diabetic but a borderline;  followed by pcp, in pcp note states DM2 and takes 2 meds daily  . Wears glasses   . Wears hearing aid in both ears     Tobacco Use: Social History   Tobacco Use  Smoking Status Former Smoker  . Years: 5.00  . Types: Pipe  . Quit date: 11/29/1976  . Years since quitting: 43.4  Smokeless Tobacco Never Used    Labs: Recent Chemical engineer    Labs for ITP Cardiac and Pulmonary Rehab Latest Ref Rng & Units 03/27/2020 03/28/2020 03/29/2020 03/30/2020 03/30/2020   Cholestrol 0 - 200 mg/dL - - - - -   LDLCALC 0 - 99 mg/dL - - - - -   HDL >40 mg/dL - - - - -   Trlycerides <150 mg/dL - - - - -   Hemoglobin A1c 4.8 - 5.6 % - - - - -   PHART 7.350 - 7.450 - - - - -   PCO2ART 32.0 - 48.0 mmHg - - - - -   HCO3 20.0 - 28.0 mmol/L - - - - -   TCO2 22 - 32 mmol/L - - - - -   ACIDBASEDEF 0.0 - 2.0 mmol/L - - - - -   O2SAT % 64.7 61.4 60.3 59.8 56.5       Exercise Target Goals: Exercise Program Goal: Individual exercise prescription set using results from initial 6 min walk test and THRR while considering  patient's activity barriers and safety.   Exercise Prescription Goal: Initial exercise prescription builds to 30-45 minutes a day of aerobic activity, 2-3 days per week.  Home exercise guidelines will be given to patient during program as part of exercise prescription that the participant will acknowledge.   Education: Aerobic Exercise: - Group verbal and visual presentation on the components of exercise prescription. Introduces F.I.T.T principle from ACSM for exercise prescriptions.  Reviews F.I.T.T. principles of aerobic exercise including progression. Written  material given at graduation.   Education: Resistance Exercise: - Group verbal and visual presentation on the components of exercise prescription. Introduces F.I.T.T principle from ACSM for exercise prescriptions  Reviews F.I.T.T. principles of resistance exercise including progression. Written material given at graduation.    Education: Exercise & Equipment Safety: - Individual verbal instruction and demonstration of equipment use and safety with  use of the equipment. Flowsheet Row Cardiac Rehab from 04/30/2020 in [facility] Cardiac and Pulmonary Rehab  Date 04/30/20  Educator [facility]  Instruction Review Code 1- Verbalizes Understanding      Education: Exercise Physiology & General Exercise Guidelines: - Group verbal and written instruction with models to review the exercise physiology of the cardiovascular system and associated critical values. Provides general exercise guidelines with specific guidelines to those with heart or lung disease.    Education: Flexibility, Balance, Mind/Body Relaxation: - Group verbal and visual presentation with interactive activity on the components of exercise prescription. Introduces F.I.T.T principle from ACSM for exercise prescriptions. Reviews F.I.T.T. principles of flexibility and balance exercise training including progression. Also discusses the mind body connection.  Reviews various relaxation techniques to help reduce and manage stress (i.e. Deep breathing, progressive muscle relaxation, and visualization). Balance handout provided to take home. Written material given at graduation.   Activity Barriers & Risk Stratification:  Activity Barriers & Cardiac Risk Stratification - 05/04/20 1237      Activity Barriers & Cardiac Risk Stratification   Activity Barriers Shortness of Breath;Decreased Ventricular Function;Deconditioning;Muscular Weakness;Other (comment)    Comments Right shoulder- limited ROM due to rotator cuff surgery and bicep tear    Cardiac Risk  Stratification High           6 Minute Walk:  6 Minute Walk    Row Name 05/04/20 1245         6 Minute Walk   Phase Initial     Distance 1000 feet     Walk Time 6 minutes     # of Rest Breaks 0     MPH 1.89     METS 2.19     RPE 11     Perceived Dyspnea  1     VO2 Peak 7.69     Symptoms No     Resting HR 98 bpm     Resting BP 112/68     Resting Oxygen Saturation  99 %     Exercise Oxygen Saturation  during 6 min walk 99 %     Max Ex. HR 116 bpm     Max Ex. BP 122/66     2 Minute Post BP 110/68            Oxygen Initial Assessment:   Oxygen Re-Evaluation:   Oxygen Discharge (Final Oxygen Re-Evaluation):   Initial Exercise Prescription:  Initial Exercise Prescription - 05/04/20 1200      Date of Initial Exercise RX and Referring Provider   Date 05/04/20    Referring Provider [name] MD      Treadmill   MPH 1.7    Grade 0.5    Minutes 15    METs 2.42      Recumbant Bike   Level 2    RPM 60    Watts 10    Minutes 15    METs 2.1      NuStep   Level 2    SPM 80    Minutes 15    METs 2.1      T5 Nustep   Level 1    SPM 80    Minutes 15    METs 2.1      Prescription Details   Frequency (times per week) 2    Duration Progress to 30 minutes of continuous aerobic without signs/symptoms of physical distress      Intensity   THRR 40-80% of Max Heartrate 116-134  Ratings of Perceived Exertion 11-13    Perceived Dyspnea 0-4      Progression   Progression Continue to progress workloads to maintain intensity without signs/symptoms of physical distress.      Resistance Training   Training Prescription Yes    Weight 3 lb    Reps 10-15           Perform Capillary Blood Glucose checks as needed.  Exercise Prescription Changes:  Exercise Prescription Changes    Row Name 05/04/20 1200             Response to Exercise   Blood Pressure (Admit) 112/68       Blood Pressure (Exercise) 122/66       Blood Pressure (Exit) 110/68        Heart Rate (Admit) 98 bpm       Heart Rate (Exercise) 116 bpm       Heart Rate (Exit) 99 bpm       Oxygen Saturation (Admit) 99 %       Oxygen Saturation (Exercise) 99 %       Oxygen Saturation (Exit) 99 %       Rating of Perceived Exertion (Exercise) 11       Perceived Dyspnea (Exercise) 1       Symptoms none       Comments walk test results              Exercise Comments:   Exercise Goals and Review:  Exercise Goals    Row Name 05/04/20 1304             Exercise Goals   Increase Physical Activity Yes       Intervention Provide advice, education, support and counseling about physical activity/exercise needs.;Develop an individualized exercise prescription for aerobic and resistive training based on initial evaluation findings, risk stratification, comorbidities and participant's personal goals.       Expected Outcomes Short Term: Attend rehab on a regular basis to increase amount of physical activity.;Long Term: Exercising regularly at least 3-5 days a week.;Long Term: Add in home exercise to make exercise part of routine and to increase amount of physical activity.       Increase Strength and Stamina Yes       Intervention Provide advice, education, support and counseling about physical activity/exercise needs.;Develop an individualized exercise prescription for aerobic and resistive training based on initial evaluation findings, risk stratification, comorbidities and participant's personal goals.       Expected Outcomes Short Term: Increase workloads from initial exercise prescription for resistance, speed, and METs.;Short Term: Perform resistance training exercises routinely during rehab and add in resistance training at home;Long Term: Improve cardiorespiratory fitness, muscular endurance and strength as measured by increased METs and functional capacity (6MWT)       Able to understand and use rate of perceived exertion (RPE) scale Yes       Intervention Provide education  and explanation on how to use RPE scale       Expected Outcomes Short Term: Able to use RPE daily in rehab to express subjective intensity level;Long Term:  Able to use RPE to guide intensity level when exercising independently       Able to understand and use Dyspnea scale Yes       Intervention Provide education and explanation on how to use Dyspnea scale       Expected Outcomes Short Term: Able to use Dyspnea scale daily in rehab to express subjective sense of  shortness of breath during exertion;Long Term: Able to use Dyspnea scale to guide intensity level when exercising independently       Knowledge and understanding of Target Heart Rate Range (THRR) Yes       Intervention Provide education and explanation of THRR including how the numbers were predicted and where they are located for reference       Expected Outcomes Short Term: Able to state/look up THRR;Short Term: Able to use daily as guideline for intensity in rehab;Long Term: Able to use THRR to govern intensity when exercising independently       Able to check pulse independently Yes       Intervention Provide education and demonstration on how to check pulse in carotid and radial arteries.;Review the importance of being able to check your own pulse for safety during independent exercise       Expected Outcomes Short Term: Able to explain why pulse checking is important during independent exercise;Long Term: Able to check pulse independently and accurately       Understanding of Exercise Prescription Yes       Intervention Provide education, explanation, and written materials on patient's individual exercise prescription       Expected Outcomes Short Term: Able to explain program exercise prescription;Long Term: Able to explain home exercise prescription to exercise independently              Exercise Goals Re-Evaluation :   Discharge Exercise Prescription (Final Exercise Prescription Changes):  Exercise Prescription Changes -  05/04/20 1200      Response to Exercise   Blood Pressure (Admit) 112/68    Blood Pressure (Exercise) 122/66    Blood Pressure (Exit) 110/68    Heart Rate (Admit) 98 bpm    Heart Rate (Exercise) 116 bpm    Heart Rate (Exit) 99 bpm    Oxygen Saturation (Admit) 99 %    Oxygen Saturation (Exercise) 99 %    Oxygen Saturation (Exit) 99 %    Rating of Perceived Exertion (Exercise) 11    Perceived Dyspnea (Exercise) 1    Symptoms none    Comments walk test results           Nutrition:  Target Goals: Understanding of nutrition guidelines, daily intake of sodium 1500mg , cholesterol 200mg , calories 30% from fat and 7% or less from saturated fats, daily to have 5 or more servings of fruits and vegetables.  Education: All About Nutrition: -Group instruction provided by verbal, written material, interactive activities, discussions, models, and posters to present general guidelines for heart healthy nutrition including fat, fiber, MyPlate, the role of sodium in heart healthy nutrition, utilization of the nutrition label, and utilization of this knowledge for meal planning. Follow up email sent as well. Written material given at graduation.   Biometrics:  Pre Biometrics - 05/04/20 1236      Pre Biometrics   Height 6' (1.829 m)    Weight 203 lb 8 oz (92.3 kg)    BMI (Calculated) 27.59    Single Leg Stand 15.9 seconds            Nutrition Therapy Plan and Nutrition Goals:   Nutrition Assessments:  MEDIFICTS Score Key:  ?70 Need to make dietary changes   40-70 Heart Healthy Diet  ? 40 Therapeutic Level Cholesterol Diet  Flowsheet Row Cardiac Rehab from 05/04/2020 in [facility] Cardiac and Pulmonary Rehab  Picture Your Plate Total Score on Admission 62     Picture Your Plate Scores:  <67 Unhealthy  dietary pattern with much room for improvement.  41-50 Dietary pattern unlikely to meet recommendations for good health and room for improvement.  51-60 More healthful dietary  pattern, with some room for improvement.   >60 Healthy dietary pattern, although there may be some specific behaviors that could be improved.    Nutrition Goals Re-Evaluation:   Nutrition Goals Discharge (Final Nutrition Goals Re-Evaluation):   Psychosocial: Target Goals: Acknowledge presence or absence of significant depression and/or stress, maximize coping skills, provide positive support system. Participant is able to verbalize types and ability to use techniques and skills needed for reducing stress and depression.   Education: Stress, Anxiety, and Depression - Group verbal and visual presentation to define topics covered.  Reviews how body is impacted by stress, anxiety, and depression.  Also discusses healthy ways to reduce stress and to treat/manage anxiety and depression.  Written material given at graduation.   Education: Sleep Hygiene -Provides group verbal and written instruction about how sleep can affect your health.  Define sleep hygiene, discuss sleep cycles and impact of sleep habits. Review good sleep hygiene tips.    Initial Review & Psychosocial Screening:  Initial Psych Review & Screening - 04/30/20 0913      Initial Review   Current issues with None Identified      Family Dynamics   Good Support System? Yes    Comments He can look to his wife  for support. [place] has a positive outlook on his health.      Barriers   Psychosocial barriers to participate in program There are no identifiable barriers or psychosocial needs.;The patient should benefit from training in stress management and relaxation.      Screening Interventions   Interventions To provide support and resources with identified psychosocial needs;Provide feedback about the scores to participant;Encouraged to exercise    Expected Outcomes Short Term goal: Utilizing psychosocial counselor, staff and physician to assist with identification of specific Stressors or current issues interfering with  healing process. Setting desired goal for each stressor or current issue identified.;Long Term Goal: Stressors or current issues are controlled or eliminated.;Short Term goal: Identification and review with participant of any Quality of Life or Depression concerns found by scoring the questionnaire.;Long Term goal: The participant improves quality of Life and PHQ9 Scores as seen by post scores and/or verbalization of changes           Quality of Life Scores:   Quality of Life - 05/04/20 1234      Quality of Life   Select Quality of Life      Quality of Life Scores   Health/Function Pre 20.07 %    Socioeconomic Pre 25.57 %    Psych/Spiritual Pre 30 %    Family Pre 27 %    GLOBAL Pre 24.31 %          Scores of 19 and below usually indicate a poorer quality of life in these areas.  A difference of  2-3 points is a clinically meaningful difference.  A difference of 2-3 points in the total score of the Quality of Life Index has been associated with significant improvement in overall quality of life, self-image, physical symptoms, and general health in studies assessing change in quality of life.  PHQ-9: Recent Review Flowsheet Data    Depression screen [facility] 2/9 05/04/2020   Decreased Interest 0   Down, Depressed, Hopeless 0   PHQ - 2 Score 0   Altered sleeping 2   Tired, decreased energy  1   Change in appetite 1   Feeling bad or failure about yourself  0   Trouble concentrating 0   Moving slowly or fidgety/restless 0   Suicidal thoughts 0   PHQ-9 Score 4   Difficult doing work/chores Not difficult at all     Interpretation of Total Score  Total Score Depression Severity:  1-4 = Minimal depression, 5-9 = Mild depression, 10-14 = Moderate depression, 15-19 = Moderately severe depression, 20-27 = Severe depression   Psychosocial Evaluation and Intervention:  Psychosocial Evaluation - 04/30/20 0914      Psychosocial Evaluation & Interventions   Interventions Encouraged to  exercise with the program and follow exercise prescription;Stress management education;Relaxation education    Comments He can look to his wife for support. [place] has a positive outlook on his health.    Expected Outcomes Short: Exercise regularly to support mental health and notify staff of any changes. Long: maintain mental health and well being through teaching of rehab or prescribed medications independently.    Continue Psychosocial Services  Follow up required by staff           Psychosocial Re-Evaluation:   Psychosocial Discharge (Final Psychosocial Re-Evaluation):   Vocational Rehabilitation: Provide vocational rehab assistance to qualifying candidates.   Vocational Rehab Evaluation & Intervention:   Education: Education Goals: Education classes will be provided on a variety of topics geared toward better understanding of heart health and risk factor modification. Participant will state understanding/return demonstration of topics presented as noted by education test scores.  Learning Barriers/Preferences:  Learning Barriers/Preferences - 04/30/20 0910      Learning Barriers/Preferences   Learning Barriers None    Learning Preferences None           General Cardiac Education Topics:  AED/CPR: - Group verbal and written instruction with the use of models to demonstrate the basic use of the AED with the basic ABC's of resuscitation.   Anatomy and Cardiac Procedures: - Group verbal and visual presentation and models provide information about basic cardiac anatomy and function. Reviews the testing methods done to diagnose heart disease and the outcomes of the test results. Describes the treatment choices: Medical Management, Angioplasty, or Coronary Bypass Surgery for treating various heart conditions including Myocardial Infarction, Angina, Valve Disease, and Cardiac Arrhythmias.  Written material given at graduation.   Medication Safety: - Group verbal and visual  instruction to review commonly prescribed medications for heart and lung disease. Reviews the medication, class of the drug, and side effects. Includes the steps to properly store meds and maintain the prescription regimen.  Written material given at graduation.   Intimacy: - Group verbal instruction through game format to discuss how heart and lung disease can affect sexual intimacy. Written material given at graduation..   Know Your Numbers and Heart Failure: - Group verbal and visual instruction to discuss disease risk factors for cardiac and pulmonary disease and treatment options.  Reviews associated critical values for Overweight/Obesity, Hypertension, Cholesterol, and Diabetes.  Discusses basics of heart failure: signs/symptoms and treatments.  Introduces Heart Failure Zone chart for action plan for heart failure.  Written material given at graduation.   Infection Prevention: - Provides verbal and written material to individual with discussion of infection control including proper hand washing and proper equipment cleaning during exercise session. Flowsheet Row Cardiac Rehab from 04/30/2020 in [facility] Cardiac and Pulmonary Rehab  Date 04/30/20  Educator [facility]  Instruction Review Code 1- Verbalizes Understanding      Falls Prevention: -  Provides verbal and written material to individual with discussion of falls prevention and safety. Flowsheet Row Cardiac Rehab from 04/30/2020 in [facility] Cardiac and Pulmonary Rehab  Date 04/30/20  Educator [facility]  Instruction Review Code 1- Verbalizes Understanding      Other: -Provides group and verbal instruction on various topics (see comments)   Knowledge Questionnaire Score:  Knowledge Questionnaire Score - 05/04/20 1227      Knowledge Questionnaire Score   Pre Score 22/26: Angina, Nutrition, Exercise           Core Components/Risk Factors/Patient Goals at Admission:  Personal Goals and Risk Factors at Admission - 05/04/20 1307      Core  Components/Risk Factors/Patient Goals on Admission    Weight Management Yes;Weight Maintenance    Intervention Weight Management: Develop a combined nutrition and exercise program designed to reach desired caloric intake, while maintaining appropriate intake of nutrient and fiber, sodium and fats, and appropriate energy expenditure required for the weight goal.;Weight Management: Provide education and appropriate resources to help participant work on and attain dietary goals.;Weight Management/Obesity: Establish reasonable short term and long term weight goals.    Admit Weight 203 lb (92.1 kg)    Goal Weight: Short Term 203 lb (92.1 kg)    Goal Weight: Long Term 203 lb (92.1 kg)    Expected Outcomes Short Term: Continue to assess and modify interventions until short term weight is achieved;Long Term: Adherence to nutrition and physical activity/exercise program aimed toward attainment of established weight goal;Weight Maintenance: Understanding of the daily nutrition guidelines, which includes 25-35% calories from fat, 7% or less cal from saturated fats, less than 200mg  cholesterol, less than 1.5gm of sodium, & 5 or more servings of fruits and vegetables daily;Understanding recommendations for meals to include 15-35% energy as protein, 25-35% energy from fat, 35-60% energy from carbohydrates, less than 200mg  of dietary cholesterol, 20-35 gm of total fiber daily;Understanding of distribution of calorie intake throughout the day with the consumption of 4-5 meals/snacks    Diabetes Yes    Intervention Provide education about signs/symptoms and action to take for hypo/hyperglycemia.;Provide education about proper nutrition, including hydration, and aerobic/resistive exercise prescription along with prescribed medications to achieve blood glucose in normal ranges: Fasting glucose 65-99 mg/dL    Expected Outcomes Short Term: Participant verbalizes understanding of the signs/symptoms and immediate care of  hyper/hypoglycemia, proper foot care and importance of medication, aerobic/resistive exercise and nutrition plan for blood glucose control.;Long Term: Attainment of HbA1C < 7%.    Heart Failure Yes    Intervention Provide a combined exercise and nutrition program that is supplemented with education, support and counseling about heart failure. Directed toward relieving symptoms such as shortness of breath, decreased exercise tolerance, and extremity edema.    Expected Outcomes Improve functional capacity of life;Short term: Attendance in program 2-3 days a week with increased exercise capacity. Reported lower sodium intake. Reported increased fruit and vegetable intake. Reports medication compliance.;Short term: Daily weights obtained and reported for increase. Utilizing diuretic protocols set by physician.;Long term: Adoption of self-care skills and reduction of barriers for early signs and symptoms recognition and intervention leading to self-care maintenance.    Hypertension Yes    Intervention Provide education on lifestyle modifcations including regular physical activity/exercise, weight management, moderate sodium restriction and increased consumption of fresh fruit, vegetables, and low fat dairy, alcohol moderation, and smoking cessation.;Monitor prescription use compliance.    Expected Outcomes Short Term: Continued assessment and intervention until BP is < 140/73mm HG in hypertensive participants. <  130/40mm HG in hypertensive participants with diabetes, heart failure or chronic kidney disease.;Long Term: Maintenance of blood pressure at goal levels.    Lipids Yes    Intervention Provide education and support for participant on nutrition & aerobic/resistive exercise along with prescribed medications to achieve LDL 70mg , HDL >40mg .    Expected Outcomes Short Term: Participant states understanding of desired cholesterol values and is compliant with medications prescribed. Participant is following  exercise prescription and nutrition guidelines.;Long Term: Cholesterol controlled with medications as prescribed, with individualized exercise RX and with personalized nutrition plan. Value goals: LDL < 70mg , HDL > 40 mg.           Education:Diabetes - Individual verbal and written instruction to review signs/symptoms of diabetes, desired ranges of glucose level fasting, after meals and with exercise. Acknowledge that pre and post exercise glucose checks will be done for 3 sessions at entry of program. Flowsheet Row Cardiac Rehab from 04/30/2020 in [facility]  Date 04/30/20  Educator [facility]  Instruction Review Code 1- Verbalizes Understanding  [States nver been diagnosed with DM and does not check BG]      Core Components/Risk Factors/Patient Goals Review:    Core Components/Risk Factors/Patient Goals at Discharge (Final Review):    ITP Comments:  ITP Comments    Row Name 04/30/20 0914 05/04/20 1223         ITP Comments Virtual Visit completed. Patient informed on EP and RD appointment and 6 Minute walk test. Patient also informed of patient health questionnaires on My Chart. Patient Verbalizes understanding. Visit diagnosis can be found in [facility] 03/21/2020. Completed 6MWT and gym orientation. Initial ITP created and sent for review to Dr. Emily Filbert, Medical Director.             Comments: Initial ITP

## 2020-05-04 NOTE — Patient Instructions (Addendum)
Patient Instructions  Patient Details  Name: Roy [name] MRN: [id number] Date of Birth: [birth year] Referring Provider:  O'Neal, [name], *  Below are your personal goals for exercise, nutrition, and risk factors. Our goal is to help you stay on track towards obtaining and maintaining these goals. We will be discussing your progress on these goals with you throughout the program.  Initial Exercise Prescription:  Initial Exercise Prescription - 05/04/20 1200      Date of Initial Exercise RX and Referring Provider   Date 05/04/20    Referring Provider [name] MD      Treadmill   MPH 1.7    Grade 0.5    Minutes 15    METs 2.42      Recumbant Bike   Level 2    RPM 60    Watts 10    Minutes 15    METs 2.1      NuStep   Level 2    SPM 80    Minutes 15    METs 2.1      T5 Nustep   Level 1    SPM 80    Minutes 15    METs 2.1      Prescription Details   Frequency (times per week) 2    Duration Progress to 30 minutes of continuous aerobic without signs/symptoms of physical distress      Intensity   THRR 40-80% of Max Heartrate 116-134    Ratings of Perceived Exertion 11-13    Perceived Dyspnea 0-4      Progression   Progression Continue to progress workloads to maintain intensity without signs/symptoms of physical distress.      Resistance Training   Training Prescription Yes    Weight 3 lb    Reps 10-15           Exercise Goals: Frequency: Be able to perform aerobic exercise two to three times per week in program working toward 2-5 days per week of home exercise.  Intensity: Work with a perceived exertion of 11 (fairly light) - 15 (hard) while following your exercise prescription.  We will make changes to your prescription with you as you progress through the program.   Duration: Be able to do 30 to 45 minutes of continuous aerobic exercise in addition to a 5 minute warm-up and a 5 minute cool-down routine.   Nutrition Goals: Your personal  nutrition goals will be established when you do your nutrition analysis with the dietician.  The following are general nutrition guidelines to follow: Cholesterol < 200mg /day Sodium < 1500mg /day Fiber: Men over 50 yrs - 30 grams per day  Personal Goals:  Personal Goals and Risk Factors at Admission - 05/04/20 1307      Core Components/Risk Factors/Patient Goals on Admission    Weight Management Yes;Weight Maintenance    Intervention Weight Management: Develop a combined nutrition and exercise program designed to reach desired caloric intake, while maintaining appropriate intake of nutrient and fiber, sodium and fats, and appropriate energy expenditure required for the weight goal.;Weight Management: Provide education and appropriate resources to help participant work on and attain dietary goals.;Weight Management/Obesity: Establish reasonable short term and long term weight goals.    Admit Weight 203 lb (92.1 kg)    Goal Weight: Short Term 203 lb (92.1 kg)    Goal Weight: Long Term 203 lb (92.1 kg)    Expected Outcomes Short Term: Continue to assess and modify interventions until short term weight is achieved;Long  Term: Adherence to nutrition and physical activity/exercise program aimed toward attainment of established weight goal;Weight Maintenance: Understanding of the daily nutrition guidelines, which includes 25-35% calories from fat, 7% or less cal from saturated fats, less than 200mg  cholesterol, less than 1.5gm of sodium, & 5 or more servings of fruits and vegetables daily;Understanding recommendations for meals to include 15-35% energy as protein, 25-35% energy from fat, 35-60% energy from carbohydrates, less than 200mg  of dietary cholesterol, 20-35 gm of total fiber daily;Understanding of distribution of calorie intake throughout the day with the consumption of 4-5 meals/snacks    Diabetes Yes    Intervention Provide education about signs/symptoms and action to take for  hypo/hyperglycemia.;Provide education about proper nutrition, including hydration, and aerobic/resistive exercise prescription along with prescribed medications to achieve blood glucose in normal ranges: Fasting glucose 65-99 mg/dL    Expected Outcomes Short Term: Participant verbalizes understanding of the signs/symptoms and immediate care of hyper/hypoglycemia, proper foot care and importance of medication, aerobic/resistive exercise and nutrition plan for blood glucose control.;Long Term: Attainment of HbA1C < 7%.    Heart Failure Yes    Intervention Provide a combined exercise and nutrition program that is supplemented with education, support and counseling about heart failure. Directed toward relieving symptoms such as shortness of breath, decreased exercise tolerance, and extremity edema.    Expected Outcomes Improve functional capacity of life;Short term: Attendance in program 2-3 days a week with increased exercise capacity. Reported lower sodium intake. Reported increased fruit and vegetable intake. Reports medication compliance.;Short term: Daily weights obtained and reported for increase. Utilizing diuretic protocols set by physician.;Long term: Adoption of self-care skills and reduction of barriers for early signs and symptoms recognition and intervention leading to self-care maintenance.    Hypertension Yes    Intervention Provide education on lifestyle modifcations including regular physical activity/exercise, weight management, moderate sodium restriction and increased consumption of fresh fruit, vegetables, and low fat dairy, alcohol moderation, and smoking cessation.;Monitor prescription use compliance.    Expected Outcomes Short Term: Continued assessment and intervention until BP is < 140/9mm HG in hypertensive participants. < 130/54mm HG in hypertensive participants with diabetes, heart failure or chronic kidney disease.;Long Term: Maintenance of blood pressure at goal levels.    Lipids  Yes    Intervention Provide education and support for participant on nutrition & aerobic/resistive exercise along with prescribed medications to achieve LDL 70mg , HDL >40mg .    Expected Outcomes Short Term: Participant states understanding of desired cholesterol values and is compliant with medications prescribed. Participant is following exercise prescription and nutrition guidelines.;Long Term: Cholesterol controlled with medications as prescribed, with individualized exercise RX and with personalized nutrition plan. Value goals: LDL < 70mg , HDL > 40 mg.           Tobacco Use Initial Evaluation: Social History   Tobacco Use  Smoking Status Former Smoker  . Years: 5.00  . Types: Pipe  . Quit date: 11/29/1976  . Years since quitting: 43.4  Smokeless Tobacco Never Used    Exercise Goals and Review:  Exercise Goals    Row Name 05/04/20 1304             Exercise Goals   Increase Physical Activity Yes       Intervention Provide advice, education, support and counseling about physical activity/exercise needs.;Develop an individualized exercise prescription for aerobic and resistive training based on initial evaluation findings, risk stratification, comorbidities and participant's personal goals.       Expected Outcomes Short Term: Attend rehab on  a regular basis to increase amount of physical activity.;Long Term: Exercising regularly at least 3-5 days a week.;Long Term: Add in home exercise to make exercise part of routine and to increase amount of physical activity.       Increase Strength and Stamina Yes       Intervention Provide advice, education, support and counseling about physical activity/exercise needs.;Develop an individualized exercise prescription for aerobic and resistive training based on initial evaluation findings, risk stratification, comorbidities and participant's personal goals.       Expected Outcomes Short Term: Increase workloads from initial exercise prescription  for resistance, speed, and METs.;Short Term: Perform resistance training exercises routinely during rehab and add in resistance training at home;Long Term: Improve cardiorespiratory fitness, muscular endurance and strength as measured by increased METs and functional capacity (6MWT)       Able to understand and use rate of perceived exertion (RPE) scale Yes       Intervention Provide education and explanation on how to use RPE scale       Expected Outcomes Short Term: Able to use RPE daily in rehab to express subjective intensity level;Long Term:  Able to use RPE to guide intensity level when exercising independently       Able to understand and use Dyspnea scale Yes       Intervention Provide education and explanation on how to use Dyspnea scale       Expected Outcomes Short Term: Able to use Dyspnea scale daily in rehab to express subjective sense of shortness of breath during exertion;Long Term: Able to use Dyspnea scale to guide intensity level when exercising independently       Knowledge and understanding of Target Heart Rate Range (THRR) Yes       Intervention Provide education and explanation of THRR including how the numbers were predicted and where they are located for reference       Expected Outcomes Short Term: Able to state/look up THRR;Short Term: Able to use daily as guideline for intensity in rehab;Long Term: Able to use THRR to govern intensity when exercising independently       Able to check pulse independently Yes       Intervention Provide education and demonstration on how to check pulse in carotid and radial arteries.;Review the importance of being able to check your own pulse for safety during independent exercise       Expected Outcomes Short Term: Able to explain why pulse checking is important during independent exercise;Long Term: Able to check pulse independently and accurately       Understanding of Exercise Prescription Yes       Intervention Provide education,  explanation, and written materials on patient's individual exercise prescription       Expected Outcomes Short Term: Able to explain program exercise prescription;Long Term: Able to explain home exercise prescription to exercise independently              Copy of goals given to participant.

## 2020-05-05 NOTE — Progress Notes (Addendum)
Cardiology Clinic Note   Patient Name: Roy [name] Date of Encounter: 05/07/2020  Primary Care Provider:  Jani Gravel, MD Primary Cardiologist:  Roy Field, MD  Patient Profile    Roy [name] 75 year old male presents the clinic today for follow-up evaluation status post CABG x4 on 03/25/2020.  Past Medical History    Past Medical History:  Diagnosis Date  . Allergic rhinitis   . Arthritis    wrists  . Cardiomyopathy, nonischemic Roy [name])    followed by cardiology--- dr Roy [name]---  2016 ef 45-50% ,  2016 nuclear ef 37%,  2017 per echo ef 50-55%  . CHF (congestive heart failure), NYHA class III (Roy [name]) 03/21/2020  . GERD (gastroesophageal reflux disease)   . Hiatal hernia   . History of kidney stones   . 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;   01/ 2022 moh's [name] w/ skin graft of nose  . History of syncope (03-06-2020 pt stated has not had sycopal episode in few years, stated it seems to happen in extreme hot conditions)   cardiologist--- dr Roy [name]--- dx recurrent syncope;  nuclear study 11-03-2014 intermediate risk w/ no ischemia, apical hypokinesis, nuclear ef 37%;  event monitor-- 12-21-2015 SB/ ST  no pauses/ arrythmia's;  echo 08-03-2015 ef 50-55%  . Hyperlipidemia   . Hypertension    followed by pcp  . Hypovitaminosis D   . Mass of left testicle   . Nocturia   . Plantar fasciitis   . Presence of surgical incision    01/ 2022  moh's w/ skin graft of nose, per pt still healing and wear bandage daily  . Type 2 diabetes mellitus (Roy [name])    pt is adament that he is not and have been told he is a diabetic but a borderline;  followed by pcp, in pcp note states DM2 and takes 2 meds daily  . Wears glasses   . Wears hearing aid in both ears    Past Surgical History:  Procedure Laterality Date  . COLONOSCOPY  last one 01-30-2017  . CORONARY ARTERY BYPASS GRAFT N/A 03/25/2020   Procedure: CORONARY ARTERY BYPASS GRAFTING (CABG), ON PUMP,  TIMES FOUR, USING BILATERAL INTERNAL MAMMARY ARTERIES AND LEFT RADIAL ARTERY;  Surgeon: Roy Olds, MD;  Location: Roy [name];  Service: Roy [name];  Laterality: N/A;  . LOW ANTERIOR RESECTION RECTUM W/ COLOPROCTOSTOMY  05/2003  . MOHS [name]  01/2020   nose w/ graft  . ORCHIECTOMY Left 03/11/2020   Procedure: Roy [name];  Surgeon: Roy Mons, MD;  Location: Roy [name];  Service: Roy [name];  Laterality: Left;  ONLY NEEDS 60 MIN  . RADIAL ARTERY HARVEST Left 03/25/2020   Procedure: LEFT RADIAL ARTERY HARVEST;  Surgeon: Roy Olds, MD;  Location: Roy [name];  Service: Roy [name];  Laterality: Left;  . RIGHT/LEFT HEART CATH AND CORONARY ANGIOGRAPHY N/A 03/23/2020   Procedure: RIGHT/LEFT HEART CATH AND CORONARY ANGIOGRAPHY;  Surgeon: Roy Booze, MD;  Location: Roy [name];  Service: Cardiovascular;  Laterality: N/A;  . SHOULDER [name] Right 1992; 07/ 2021  . 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 TEE WITHOUT CARDIOVERSION N/A 03/25/2020   Procedure: TRANSESOPHAGEAL ECHOCARDIOGRAM (TEE);  Surgeon: Roy Olds, MD;  Location: Roy [name];  Service: Roy [name];  Laterality: N/A;  . TONSILLECTOMY AND ADENOIDECTOMY  child  . 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  He noticed acute onset of dyspnea and lay down to 2622.  He also noted some epigastric type discomfort which she felt was related to indigestion.  He coughed up some pink frothy sputum.  At that time he contacted EMS.  His oxygen saturation was noted to be 82% on arrival.  He was placed on nonrebreather and EKG demonstrated  sinus tachycardia with PVCs, mild ST depression in V2, nonspecific ST changes.  His high-sensitivity troponins were elevated to 1400.  He was [name] by cardiology and diagnosed with acute systolic CHF with non-ST elevated MI on 03/21/2020.  CTA was obtained and ruled out for PE.  He was however noted to have significant three-vessel coronary calcification.  He was started on heparin drip and given aspirin.  He was diuresed 2.7 L within 24 hours of admission.  He noted significant improvement in symptomology.  He underwent left heart catheterization which demonstrated severe three-vessel coronary artery disease with 80% left main stenosis, echocardiogram showed EF 25-35%, no significant valvular abnormalities were noted.  He was [name] by Roy [name]/Roy [name] and underwent CABG x4 on 03/25/2020.  (LIMA-LAD, SVG-OM1, OM2, RIMA-PDA.)  Tolerated the procedure well.  He was weaned off vasopressors.  He was extubated and his chest tubes and arterial line were removed without difficulty.  On 03/29/2020 he was noted to have brief episode of atrial fibrillation that converted back to normal sinus rhythm prior to IV amiodarone initiation.  He was started on oral amiodarone load.  He developed diarrhea which was felt to be due to stool softeners.  His surgical incisions were healing without signs of infection.  He denied paresthesia of his radial harvest arm.  He was discharged in stable condition on 04/02/2020.  He presents the clinic today for follow-up evaluation states he feels fairly well.  His main complaint today is that he is unable to sleep.  He reports that he will go to bed at around 10 PM and wake up a few hours later being unable to go back to sleep.  We discussed sleep hygiene.  He is now taking furosemide as needed and reports that his breathing is much better since [name] Roy [name].  His heart rate is slightly elevated today at 101 bpm.  We reviewed his pumping function which is decreased.  Due to his elevated heart rate,  blood pressure, and creatinine of 1.13 I will hold off on starting Entresto at this time and increase his carvedilol to 12 point 5 in the AM and continue his 6.25 afternoon dosing.  We discussed his transition of care to Dr. [name] from Dr. Meda [name].  I will have him maintain his physical activity, follow a low-sodium heart healthy diet, and follow-up with Dr. Davina Poke scheduled.  Today he denies chest pain, shortness of breath, lower extremity edema, fatigue, palpitations, melena, hematuria, hemoptysis, diaphoresis, weakness, presyncope, syncope, orthopnea, and PND.  Home Medications    Prior to Admission medications   Medication Sig Start Date End Date Taking? Authorizing Provider  acetaminophen (TYLENOL) 325 MG tablet Take 2 tablets (650 mg total) by mouth every 4 (four) hours as needed for headache or mild pain. Patient not taking: No sig reported 04/01/20  Barrett, Erin R, PA-C  Apoaequorin (PREVAGEN PO) Take 1 tablet by mouth at bedtime.    [provider]  aspirin EC 81 MG tablet Take 81 mg by mouth daily. Swallow whole.    [provider]  carvedilol (COREG) 6.25 MG tablet TAKE 1 TABLET BY MOUTH 2 TIMES DAILY. 04/10/20   Dorothy Spark, MD  cholecalciferol (VITAMIN D) 1000 UNITS tablet Take 1,000 Units by mouth See admin instructions. Mon-friday    [provider]  COVID-19 mRNA vaccine, Pfizer, 30 MCG/0.3ML injection USE AS DIRECTED Patient not taking: No sig reported 12/10/19 12/09/20  Carlyle Basques, MD  furosemide (LASIX) 80 MG tablet Take 1 tablet (80 mg total) by mouth daily. 04/23/20 04/23/21  Roy Olds, MD  glucosamine-chondroitin 500-400 MG tablet Take 1 tablet by mouth. 5 times weekly    [provider]  lisinopril (ZESTRIL) 5 MG tablet Take 1 tablet (5 mg total) by mouth daily. 04/01/20   Barrett, Erin R, PA-C  loperamide (IMODIUM) 2 MG capsule Take 1 capsule (2 mg total) by mouth as needed for diarrhea or loose stools. Patient not  taking: No sig reported 04/01/20   Barrett, Erin R, PA-C  metFORMIN (GLUCOPHAGE) 1000 MG tablet Take 1,000 mg by mouth 2 (two) times daily with a meal.     [provider]  Multiple Vitamin (MULTIVITAMIN WITH MINERALS) TABS tablet Take 1 tablet by mouth. 5 times weekly    [provider]  omeprazole (PRILOSEC) 20 MG capsule Take 20 mg by mouth daily.    [provider]  pioglitazone (ACTOS) 30 MG tablet Take 30 mg by mouth daily.    [provider]  pravastatin (PRAVACHOL) 80 MG tablet TAKE 1 TABLET (80 MG TOTAL) BY MOUTH DAILY. 04/20/20   Dorothy Spark, MD  tamsulosin (FLOMAX) 0.4 MG CAPS capsule Take 0.4 mg by mouth at bedtime.    [provider]  traMADol (ULTRAM) 50 MG tablet Take 1 tablet (50 mg total) by mouth every 4 (four) hours as needed for moderate pain. Patient not taking: No sig reported 04/01/20   Barrett, Erin R, PA-C  UNABLE TO FIND Take 1 tablet by mouth 2 (two) times a day. Study cholesterol drug if drug name needed please call April McCaskill at [phone]    [provider]  vitamin B-12 (CYANOCOBALAMIN) 1000 MCG tablet Take 1,000 mcg by mouth. 5 times a week    [provider]    Family History    Family History  Problem Relation Age of Onset  . Heart attack Mother   . Stroke Father   . Colon cancer Neg Hx   . Esophageal cancer Neg Hx   . Pancreatic cancer Neg Hx   . Rectal cancer Neg Hx   . Stomach cancer Neg Hx   . Colon polyps Neg Hx    He indicated that his mother is deceased. He indicated that his father is deceased. He indicated that his brother is deceased. He indicated that the status of his neg hx is unknown.  Social History    Social History   Socioeconomic History  . Marital status: Married    Spouse name: Not on file  . Number of children: Not on file  . Years of education: Not on file  . Highest education level: Not on file  Occupational History  . Not on file  Tobacco Use  .  Smoking status: Former Smoker    Years: 5.00    Types:  Pipe    Quit date: 11/29/1976    Years since quitting: 43.4  . Smokeless tobacco: Never Used  Vaping Use  . Vaping Use: Never used  Substance and Sexual Activity  . Alcohol use: Yes    Alcohol/week: 0.0 standard drinks    Comment: Occasional drink   . Drug use: Never  . Sexual activity: Not on file  Other Topics Concern  . Not on file  Social History Narrative  . Not on file   Social Determinants of Health   Financial Resource Strain: Not on file  Food Insecurity: Not on file  Transportation Needs: Not on file  Physical Activity: Not on file  Stress: Not on file  Social Connections: Not on file  Intimate Partner Violence: Not on file     Review of Systems    General:  No chills, fever, night sweats or weight changes.  Cardiovascular:  No chest pain, dyspnea on exertion, edema, orthopnea, palpitations, paroxysmal nocturnal dyspnea. Dermatological: No rash, lesions/masses Respiratory: No cough, dyspnea Urologic: No hematuria, dysuria Abdominal:   No nausea, vomiting, diarrhea, bright red blood per rectum, melena, or hematemesis Neurologic:  No visual changes, wkns, changes in mental status. All other systems reviewed and are otherwise negative except as noted above.  Physical Exam    VS:  BP 110/78   Pulse (!) 101   Ht 6\' 1"  (1.854 m)   Wt 206 lb 6.4 oz (93.6 kg)   BMI 27.23 kg/m  , BMI Body mass index is 27.23 kg/m. GEN: Well nourished, well developed, in no acute distress. HEENT: normal. Neck: Supple, no JVD, carotid bruits, or masses. Cardiac: RRR, no murmurs, rubs, or gallops. No clubbing, cyanosis, edema.  Radials/DP/PT 2+ and equal bilaterally.  Respiratory:  Respirations regular and unlabored, clear to auscultation bilaterally. GI: Soft, nontender, nondistended, BS + x 4. MS: no deformity or atrophy. Skin: warm and dry, no rash. Neuro:  Strength and sensation are intact. Psych: Normal  affect.  Accessory Clinical Findings    Recent Labs: 03/21/2020: B Natriuretic Peptide 648.5 03/31/2020: Magnesium 1.6 04/27/2020: ALT 12; BUN 15; Creatinine 1.13; Hemoglobin 10.7; Platelets 333; Potassium 4.1; Sodium 142   Recent Lipid Panel    Component Value Date/Time   CHOL 140 03/21/2020 1147   TRIG 148 03/21/2020 1147   HDL 36 (L) 03/21/2020 1147   CHOLHDL 3.9 03/21/2020 1147   VLDL 30 03/21/2020 1147   LDLCALC 74 03/21/2020 1147    ECG personally reviewed by me today-sinus tachycardia minimal voltage criteria for LVH ST and T wave abnormality consider lateral ischemia 101 bpm- No acute changes  Echocardiogram 03/22/2020 IMPRESSIONS    1. Global hypokinesis with akinesis of the inferior and inferolateral  walls; overall severe LV dysfunction.  2. Left ventricular ejection fraction, by estimation, is 25 to 30%. The  left ventricle has severely decreased function. The left ventricle  demonstrates regional wall motion abnormalities (see scoring  diagram/findings for description). The left  ventricular internal cavity size was mildly dilated. Left ventricular  diastolic parameters are consistent with Grade I diastolic dysfunction  (impaired relaxation).  3. Right ventricular systolic function is normal. The right ventricular  size is normal. Tricuspid regurgitation signal is inadequate for assessing  PA pressure.  4. The mitral valve is normal in structure. Mild mitral valve  regurgitation. No evidence of mitral stenosis.  5. The aortic valve is tricuspid. Aortic valve regurgitation is not  visualized. Mild aortic valve sclerosis is present, with no evidence of  aortic  valve stenosis.  6. The inferior vena cava is normal in size with greater than 50%  respiratory variability, suggesting right atrial pressure of 3 mmHg.  Cardiac catheterization 03/23/2020  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  Prox RCA-2 lesion is 50% stenosed.  Dist RCA lesion is 50%  stenosed.  Ost Cx lesion is 90% stenosed.  Mid LM lesion is 80% stenosed.  Ost LAD lesion is 75% stenosed.  Mid Cx to Dist Cx lesion is 75% stenosed.  2nd Mrg lesion is 75% stenosed.  2nd Diag lesion is 75% stenosed.  Mid LAD-1 lesion is 75% stenosed.  Mid LAD-2 lesion is 75% stenosed.  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  The left ventricular ejection fraction is 25-35% by visual estimate.  LV end diastolic pressure is normal.  There is no aortic valve stenosis.  Ao sat 94%, PA sat 66%, PA pressure 27/6, mean PA 15 mm Hg; mean PCWP 8 mm Hg, CO 7.5 L/min; CI 3.3. Normal right heart pressures.   Severe, calcific multivessel disease. Cardiac [name] consult.    Results conveyed to his wife by phone, and to Dr. [name].  Diagnostic Dominance: Right    Intervention    Assessment & Plan   1.  Coronary artery disease-reports decreasing surgical site soreness.  Slowly increasing physical activity walking 2-3 times per day for 15-20 minutes.  Status post CABG x4 on 03/25/2020.  (LIMA-LAD, SVG-OM1, OM2, RIMA-PDA.)  Continues to progress in cardiac rehab.  Having trouble returning to sleep patterns. Continue aspirin,  lisinopril, pravastatin Increase carvedilol to 12 point 5 AM and 6.2 5 PM Heart healthy low-sodium diet-salty 6 given Slowly increase physical activity well maintain sternal precautions.-May start cardiac rehab. Sleep hygiene instructions given  Systolic and diastolic combined CHF-no increased DOE or activity intolerance.  Slowly increasing his physical activity while maintaining sternal precautions.  EF noted to be 25-30% on 03/22/2020. Continue  carvedilol, furosemide, potassium Heart healthy low-sodium diet-salty 6 given Increase physical activity as tolerated Stop lisinopril  Atrial fibrillation-EKG today shows sinus tachycardia 101 bpm.  Noted to have a brief episode of postoperative atrial fibrillation that converted to sinus rhythm  prior to initiation of IV amiodarone. Continue carvedilol Heart healthy low-sodium diet-salty 6 given Increase physical activity as tolerated Avoid triggers caffeine, chocolate, EtOH, dehydration etc.  Hyperlipidemia-03/21/2020: Cholesterol 140; HDL 36; LDL Cholesterol 74; Triglycerides 148; VLDL 30 Continue pravastatin Heart healthy low-sodium high-fiber diet increase physical activity as tolerated  Essential hypertension-BP today 110/78.  Well-controlled at home. Continue carvedilol,  Heart healthy low-sodium diet-salty 6 given Increase physical activity as tolerated   Disposition: Follow-up with Dr. [name]  in 2 month.   Jossie Ng. Aslin Farinas NP-C    05/07/2020, 9:41 AM [address] Office [phone] Fax [phone]  Notice: This dictation was prepared with Dragon dictation along with smaller phrase technology. Any transcriptional errors that result from this process are unintentional and may not be corrected upon review.  I spent 15 minutes examining this patient, reviewing medications, and using patient centered shared decision making involving her cardiac care.  Prior to her visit I spent greater than 20 minutes reviewing her past medical history,  medications, and prior cardiac tests.

## 2020-05-06 DIAGNOSIS — E119 Type 2 diabetes mellitus without complications: Secondary | ICD-10-CM | POA: Diagnosis not present

## 2020-05-06 DIAGNOSIS — R7309 Other abnormal glucose: Secondary | ICD-10-CM | POA: Diagnosis not present

## 2020-05-06 DIAGNOSIS — E7800 Pure hypercholesterolemia, unspecified: Secondary | ICD-10-CM | POA: Diagnosis not present

## 2020-05-06 DIAGNOSIS — Z125 Encounter for screening for malignant neoplasm of prostate: Secondary | ICD-10-CM | POA: Diagnosis not present

## 2020-05-06 DIAGNOSIS — I10 Essential (primary) hypertension: Secondary | ICD-10-CM | POA: Diagnosis not present

## 2020-05-07 ENCOUNTER — Ambulatory Visit: Payer: Medicare HMO | Admitting: General Practice

## 2020-05-07 ENCOUNTER — Encounter: Payer: Self-pay | Admitting: General Practice

## 2020-05-07 ENCOUNTER — Other Ambulatory Visit: Payer: Self-pay

## 2020-05-07 VITALS — BP 110/78 | HR 101 | Ht 73.0 in | Wt 206.4 lb

## 2020-05-07 DIAGNOSIS — I10 Essential (primary) hypertension: Secondary | ICD-10-CM

## 2020-05-07 DIAGNOSIS — Z951 Presence of aortocoronary bypass graft: Secondary | ICD-10-CM

## 2020-05-07 DIAGNOSIS — I428 Other cardiomyopathies: Secondary | ICD-10-CM | POA: Diagnosis not present

## 2020-05-07 DIAGNOSIS — I4891 Unspecified atrial fibrillation: Secondary | ICD-10-CM

## 2020-05-07 DIAGNOSIS — E782 Mixed hyperlipidemia: Secondary | ICD-10-CM | POA: Diagnosis not present

## 2020-05-07 DIAGNOSIS — I5041 Acute combined systolic (congestive) and diastolic (congestive) heart failure: Secondary | ICD-10-CM

## 2020-05-07 MED ORDER — CARVEDILOL 6.25 MG PO TABS: ORAL_TABLET | ORAL | 1 refills | Status: DC

## 2020-05-07 NOTE — Patient Instructions (Addendum)
Medication Instructions:  INCREASE CARVEDILOL 12.5MG  IN THE am AND 6.25MG  IN THE PM *If you need a refill on your cardiac medications before your next appointment, please call your pharmacy*  Special Instructions PLEASE READ AND FOLLOW SLEEP TIPS-ATTACHED  Please try to avoid these triggers:  Do not use any products that have nicotine or tobacco in them. These include cigarettes, e-cigarettes, and chewing tobacco. If you need help quitting, ask your doctor.  Eat heart-healthy foods. Talk with your doctor about the right eating plan for you.  Exercise regularly as told by your doctor.  Stay hydrated  Do not drink alcohol, Caffeine or chocolate.  Lose weight if you are overweight.  Do not use drugs, including cannabis   PLEASE READ AND FOLLOW SALTY 6-ATTACHED-1,800mg  daily  PLEASE MAINTAIN PHYSICAL ACTIVITY AS TOLERATED  Follow-Up: Your next appointment:  KEEP SCHEDULED  In Person with [name], MD At [facility], you and your health needs are our priority.  As part of our continuing mission to provide you with exceptional heart care, we have created designated Provider Care Teams.  These Care Teams include your primary Cardiologist (physician) and Advanced Practice Providers (APPs -  Physician Assistants and Nurse Practitioners) who all work together to provide you with the care you need, when you need it.            6 SALTY THINGS TO AVOID     1,800MG  DAILY     Quality Sleep Information, Adult Quality sleep is important for your mental and physical health. It also improves your quality of life. Quality sleep means you:  Are asleep for most of the time you are in bed.  Fall asleep within 30 minutes.  Wake up no more than once a night.  Are awake for no longer than 20 minutes if you do wake up during the night. Most adults need 7-8 hours of quality sleep each night. How can poor sleep affect me? If you do not get enough quality sleep, you may have:  Mood  swings.  Daytime sleepiness.  Confusion.  Decreased reaction time.  Sleep disorders, such as insomnia and sleep apnea.  Difficulty with: ? Solving problems. ? Coping with stress. ? Paying attention. These issues may affect your performance and productivity at work, school, and at home. Lack of sleep may also put you at higher risk for accidents, suicide, and risky behaviors. If you do not get quality sleep you may also be at higher risk for several health problems, including:  Infections.  Type 2 diabetes.  Heart disease.  High blood pressure.  Obesity.  Worsening of long-term conditions, like arthritis, kidney disease, depression, Parkinson's disease, and epilepsy. What actions can I take to get more quality sleep?  Stick to a sleep schedule. Go to sleep and wake up at about the same time each day. Do not try to sleep less on weekdays and make up for lost sleep on weekends. This does not work.  Try to get about 30 minutes of exercise on most days. Do not exercise 2-3 hours before going to bed.  Limit naps during the day to 30 minutes or less.  Do not use any products that contain nicotine or tobacco, such as cigarettes or e-cigarettes. If you need help quitting, ask your health care provider.  Do not drink caffeinated beverages for at least 8 hours before going to bed. Coffee, tea, and some sodas contain caffeine.  Do not drink alcohol close to bedtime.  Do not eat large  meals close to bedtime.  Do not take naps in the late afternoon.  Try to get at least 30 minutes of sunlight every day. Morning sunlight is best.  Make time to relax before bed. Reading, listening to music, or taking a hot bath promotes quality sleep.  Make your bedroom a place that promotes quality sleep. Keep your bedroom dark, quiet, and at a comfortable room temperature. Make sure your bed is comfortable. Take out sleep distractions like TV, a computer, smartphone, and bright lights.  If you  are lying awake in bed for longer than 20 minutes, get up and do a relaxing activity until you feel sleepy.  Work with your health care provider to treat medical conditions that may affect sleeping, such as: ? Nasal obstruction. ? Snoring. ? Sleep apnea and other sleep disorders.  Talk to your health care provider if you think any of your prescription medicines may cause you to have difficulty falling or staying asleep.  If you have sleep problems, talk with a sleep consultant. If you think you have a sleep disorder, talk with your health care provider about getting evaluated by a specialist.      Where to find more information  [place] website: https://sleepfoundation.org  National Heart, Lung, and [place] ([place]): http://www.saunders.info/.pdf  Centers for Disease Control and Prevention (CDC): LearningDermatology.pl Contact a health care provider if you:  Have trouble getting to sleep or staying asleep.  Often wake up very early in the morning and cannot get back to sleep.  Have daytime sleepiness.  Have daytime sleep attacks of suddenly falling asleep and sudden muscle weakness (narcolepsy).  Have a tingling sensation in your legs with a strong urge to move your legs (restless legs syndrome).  Stop breathing briefly during sleep (sleep apnea).  Think you have a sleep disorder or are taking a medicine that is affecting your quality of sleep. Summary  Most adults need 7-8 hours of quality sleep each night.  Getting enough quality sleep is an important part of health and well-being.  Make your bedroom a place that promotes quality sleep and avoid things that may cause you to have poor sleep, such as alcohol, caffeine, smoking, and large meals.  Talk to your health care provider if you have trouble falling asleep or staying asleep. This information is not intended to replace advice given to you by your health care  provider. Make sure you discuss any questions you have with your health care provider. Document Revised: 04/19/2017 Document Reviewed: 04/19/2017 Elsevier Patient Education  2021 [employer].

## 2020-05-11 ENCOUNTER — Ambulatory Visit (HOSPITAL_COMMUNITY)
Admission: RE | Admit: 2020-05-11 | Discharge: 2020-05-11 | Disposition: A | Discharge status: Home / Self Care | Payer: Medicare HMO | Source: Ambulatory Visit | Attending: Hematology | Admitting: Hematology

## 2020-05-11 ENCOUNTER — Other Ambulatory Visit: Payer: Self-pay

## 2020-05-11 DIAGNOSIS — C8330 Diffuse large B-cell lymphoma, unspecified site: Secondary | ICD-10-CM | POA: Diagnosis not present

## 2020-05-11 DIAGNOSIS — C8512 Unspecified B-cell lymphoma, intrathoracic lymph nodes: Secondary | ICD-10-CM | POA: Diagnosis not present

## 2020-05-11 LAB — GLUCOSE, CAPILLARY: Glucose-Capillary: 146 mg/dL — ABNORMAL HIGH (ref 70–99)

## 2020-05-11 IMAGING — CT NM PET TUM IMG INITIAL (PI) SKULL BASE T - THIGH
1 of 7 series · 1 of 25 positions shown · non-contrast
Comparison: Testicular ultrasound from [DATE]

CLINICAL DATA: Initial treatment strategy for B-cell lymphoma the
testicle.

EXAM:
NUCLEAR MEDICINE PET SKULL BASE TO THIGH
TECHNIQUE: 10.0 mCi F-18 FDG was injected intravenously. Full-ring PET imaging
was performed from the skull base to thigh after the radiotracer. CT
data was obtained and used for attenuation correction and anatomic
localization.
Fasting blood glucose: 146 mg/dl

[Series 4: ct sk_thigh 5.0 bf37 · axial · 5.0mm · 0.98mm/px · 1 of 228 slices shown]
[im 228/228  brain]
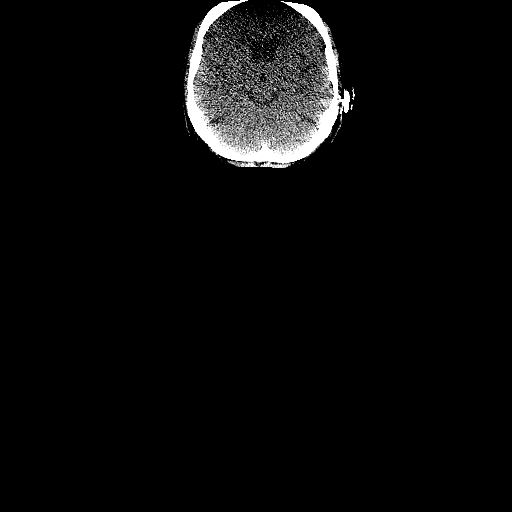

[1 of 25 positions shown; findings below may reference images not displayed]

FINDINGS: Mediastinal blood pool activity: SUV max

Liver activity: SUV max

NECK: No hypermetabolic lymph nodes in the neck.

Incidental CT findings: none

CHEST: Scattered mediastinal and hilar lymph nodes without
significant hypermetabolism. Right paratracheal node on image 50/4
measures 10 mm and the SUV max is [REDACTED] mm subcarinal lymph node
has an SUV max of 2.40.

No supraclavicular or axillary adenopathy.

No worrisome pulmonary lesions. There are moderate-sized bilateral
pleural effusions noted.

Incidental CT findings: Advanced three-vessel coronary artery
calcifications and evidence of prior coronary artery bypass surgery.

ABDOMEN/PELVIS: No abnormal hypermetabolic activity within the
liver, pancreas, adrenal glands, or spleen. No hypermetabolic lymph
nodes in the abdomen or pelvis. No splenomegaly or diffuse increased
metabolic activity.

Incidental CT findings: Aortic calcifications but no aneurysm. Small
bilateral renal calculi. Small gallstones. Surgical changes from a
left orchiectomy. No inguinal adenopathy.

SKELETON: No focal hypermetabolic activity to suggest skeletal
metastasis.

Incidental CT findings: none
IMPRESSION: Negative PET-CT for metabolically active lymphadenopathy in the
neck, chest, abdomen or pelvis.

## 2020-05-11 MED ORDER — FLUDEOXYGLUCOSE F - 18 (FDG) INJECTION
10.0000 | Freq: Once | INTRAVENOUS | Status: AC | PRN
  Administered 2020-05-11: 10 via INTRAVENOUS

## 2020-05-12 ENCOUNTER — Ambulatory Visit (HOSPITAL_COMMUNITY)
Admission: RE | Admit: 2020-05-12 | Discharge: 2020-05-12 | Disposition: A | Discharge status: Home / Self Care | Payer: Medicare HMO | Source: Ambulatory Visit | Attending: Hematology | Admitting: Hematology

## 2020-05-12 DIAGNOSIS — C8330 Diffuse large B-cell lymphoma, unspecified site: Secondary | ICD-10-CM | POA: Insufficient documentation

## 2020-05-12 DIAGNOSIS — Z5181 Encounter for therapeutic drug level monitoring: Secondary | ICD-10-CM | POA: Insufficient documentation

## 2020-05-12 DIAGNOSIS — E785 Hyperlipidemia, unspecified: Secondary | ICD-10-CM | POA: Insufficient documentation

## 2020-05-12 DIAGNOSIS — I219 Acute myocardial infarction, unspecified: Secondary | ICD-10-CM | POA: Insufficient documentation

## 2020-05-12 DIAGNOSIS — I358 Other nonrheumatic aortic valve disorders: Secondary | ICD-10-CM | POA: Insufficient documentation

## 2020-05-12 DIAGNOSIS — Z0189 Encounter for other specified special examinations: Secondary | ICD-10-CM | POA: Diagnosis not present

## 2020-05-12 DIAGNOSIS — I110 Hypertensive heart disease with heart failure: Secondary | ICD-10-CM | POA: Insufficient documentation

## 2020-05-12 DIAGNOSIS — E119 Type 2 diabetes mellitus without complications: Secondary | ICD-10-CM | POA: Insufficient documentation

## 2020-05-12 DIAGNOSIS — I509 Heart failure, unspecified: Secondary | ICD-10-CM | POA: Diagnosis not present

## 2020-05-12 LAB — ECHOCARDIOGRAM COMPLETE
Calc EF: 21.8 %
S' Lateral: 5.1 cm
Single Plane A2C EF: 24 %
Single Plane A4C EF: 19.9 %

## 2020-05-12 NOTE — Progress Notes (Signed)
  Echocardiogram 2D Echocardiogram has been performed.  Roy [name] 05/12/2020, 10:18 AM

## 2020-05-13 ENCOUNTER — Encounter: Payer: Self-pay | Admitting: *Deleted

## 2020-05-13 DIAGNOSIS — E1159 Type 2 diabetes mellitus with other circulatory complications: Secondary | ICD-10-CM | POA: Diagnosis not present

## 2020-05-13 DIAGNOSIS — E7800 Pure hypercholesterolemia, unspecified: Secondary | ICD-10-CM | POA: Diagnosis not present

## 2020-05-13 DIAGNOSIS — Z0000 Encounter for general adult medical examination without abnormal findings: Secondary | ICD-10-CM | POA: Diagnosis not present

## 2020-05-13 DIAGNOSIS — C8339 Diffuse large B-cell lymphoma, extranodal and solid organ sites: Secondary | ICD-10-CM | POA: Diagnosis not present

## 2020-05-13 DIAGNOSIS — I5189 Other ill-defined heart diseases: Secondary | ICD-10-CM | POA: Diagnosis not present

## 2020-05-13 DIAGNOSIS — Z951 Presence of aortocoronary bypass graft: Secondary | ICD-10-CM

## 2020-05-13 DIAGNOSIS — D509 Iron deficiency anemia, unspecified: Secondary | ICD-10-CM | POA: Diagnosis not present

## 2020-05-13 DIAGNOSIS — I10 Essential (primary) hypertension: Secondary | ICD-10-CM | POA: Diagnosis not present

## 2020-05-13 DIAGNOSIS — Z125 Encounter for screening for malignant neoplasm of prostate: Secondary | ICD-10-CM | POA: Diagnosis not present

## 2020-05-13 NOTE — Progress Notes (Signed)
Cardiac Individual Treatment Plan  Patient Details  Name: Roy [name] MRN: [id number] Date of Birth: 11-Jul-1945 Referring Provider:   Flowsheet Row Cardiac Rehab from 05/04/2020 in [facility] Cardiac and Pulmonary Rehab  Referring Provider [name] MD      Initial Encounter Date:  Flowsheet Row Cardiac Rehab from 05/04/2020 in [facility] Cardiac and Pulmonary Rehab  Date 05/04/20      Visit Diagnosis: S/P CABG x 4  Patient's Home Medications on Admission:  Current Outpatient Medications:  .  acetaminophen (TYLENOL) 325 MG tablet, Take 2 tablets (650 mg total) by mouth every 4 (four) hours as needed for headache or mild pain., Disp: , Rfl:  .  Apoaequorin (PREVAGEN PO), Take 1 tablet by mouth at bedtime., Disp: , Rfl:  .  aspirin EC 81 MG tablet, Take 81 mg by mouth daily. Swallow whole., Disp: , Rfl:  .  carvedilol (COREG) 6.25 MG tablet, Take 2 tablets (12.5 mg total) by mouth in the morning AND 1 tablet (6.25 mg total) every evening., Disp: 180 tablet, Rfl: 1 .  cholecalciferol (VITAMIN D) 1000 UNITS tablet, Take 1,000 Units by mouth See admin instructions. Mon-friday, Disp: , Rfl:  .  COVID-19 mRNA vaccine, Pfizer, 30 MCG/0.3ML injection, USE AS DIRECTED, Disp: .3 mL, Rfl: 0 .  furosemide (LASIX) 80 MG tablet, Take 1 tablet (80 mg total) by mouth daily., Disp: 30 tablet, Rfl: 11 .  glucosamine-chondroitin 500-400 MG tablet, Take 1 tablet by mouth. 5 times weekly, Disp: , Rfl:  .  lisinopril (ZESTRIL) 5 MG tablet, Take 1 tablet (5 mg total) by mouth daily., Disp: 30 tablet, Rfl: 3 .  loperamide (IMODIUM) 2 MG capsule, Take 1 capsule (2 mg total) by mouth as needed for diarrhea or loose stools., Disp: 30 capsule, Rfl: 0 .  metFORMIN (GLUCOPHAGE) 1000 MG tablet, Take 1,000 mg by mouth 2 (two) times daily with a meal. , Disp: , Rfl:  .  Multiple Vitamin (MULTIVITAMIN WITH MINERALS) TABS tablet, Take 1 tablet by mouth. 5 times weekly, Disp: , Rfl:  .  omeprazole (PRILOSEC) 20 MG capsule, Take  20 mg by mouth daily., Disp: , Rfl:  .  pioglitazone (ACTOS) 30 MG tablet, Take 30 mg by mouth daily., Disp: , Rfl:  .  pravastatin (PRAVACHOL) 80 MG tablet, TAKE 1 TABLET (80 MG TOTAL) BY MOUTH DAILY., Disp: 90 tablet, Rfl: 1 .  tamsulosin (FLOMAX) 0.4 MG CAPS capsule, Take 0.4 mg by mouth at bedtime., Disp: , Rfl:  .  traMADol (ULTRAM) 50 MG tablet, Take 1 tablet (50 mg total) by mouth every 4 (four) hours as needed for moderate pain., Disp: 30 tablet, Rfl: 0 .  UNABLE TO FIND, Take 1 tablet by mouth 2 (two) times a day. Study cholesterol drug if drug name needed please call April McCaskill at [phone], Disp: , Rfl:  .  vitamin B-12 (CYANOCOBALAMIN) 1000 MCG tablet, Take 1,000 mcg by mouth. 5 times a week, Disp: , Rfl:   Current Facility-Administered Medications:  .  potassium chloride (KLOR-CON) CR tablet 20 mEq, 20 mEq, Oral, BID, Atkins, Glenice Bow, MD  Past Medical History: Past Medical History:  Diagnosis Date  . Allergic rhinitis   . Arthritis    wrists  . Cardiomyopathy, nonischemic [facility])    followed by cardiology--- dr [name]---  2016 ef 45-50% ,  2016 nuclear ef 37%,  2017 per echo ef 50-55%  . CHF (congestive heart failure), NYHA class III ([place]) 03/21/2020  . GERD (gastroesophageal reflux disease)   .  Hiatal hernia   . History of kidney stones   . 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 History of syncope (03-06-2020 pt stated has not had sycopal episode in few years, stated it seems to happen in extreme hot conditions)   cardiologist--- dr [name]--- dx recurrent syncope;  nuclear study 11-03-2014 intermediate risk w/ no ischemia, apical hypokinesis, nuclear ef 37%;  event monitor-- 12-21-2015 SB/ ST  no pauses/ arrythmia's;  echo 08-03-2015 ef 50-55%  . Hyperlipidemia   . Hypertension    followed by pcp  . Hypovitaminosis D   . Mass of left testicle   . Nocturia   . Plantar fasciitis    . Presence of surgical incision    01/ 2022  moh's w/ skin graft of nose, per pt still healing and wear bandage daily  . Type 2 diabetes mellitus ([place])    pt is adament that he is not and have been told he is a diabetic but a borderline;  followed by pcp, in pcp note states DM2 and takes 2 meds daily  . Wears glasses   . Wears hearing aid in both ears     Tobacco Use: Social History   Tobacco Use  Smoking Status Former Smoker  . Years: 5.00  . Types: Pipe  . Quit date: 11/29/1976  . Years since quitting: 43.4  Smokeless Tobacco Never Used    Labs: Recent Chemical engineer    Labs for ITP Cardiac and Pulmonary Rehab Latest Ref Rng & Units 03/27/2020 03/28/2020 03/29/2020 03/30/2020 03/30/2020   Cholestrol 0 - 200 mg/dL - - - - -   LDLCALC 0 - 99 mg/dL - - - - -   HDL >40 mg/dL - - - - -   Trlycerides <150 mg/dL - - - - -   Hemoglobin A1c 4.8 - 5.6 % - - - - -   PHART 7.350 - 7.450 - - - - -   PCO2ART 32.0 - 48.0 mmHg - - - - -   HCO3 20.0 - 28.0 mmol/L - - - - -   TCO2 22 - 32 mmol/L - - - - -   ACIDBASEDEF 0.0 - 2.0 mmol/L - - - - -   O2SAT % 64.7 61.4 60.3 59.8 56.5       Exercise Target Goals: Exercise Program Goal: Individual exercise prescription set using results from initial 6 min walk test and THRR while considering  patient's activity barriers and safety.   Exercise Prescription Goal: Initial exercise prescription builds to 30-45 minutes a day of aerobic activity, 2-3 days per week.  Home exercise guidelines will be given to patient during program as part of exercise prescription that the participant will acknowledge.   Education: Aerobic Exercise: - Group verbal and visual presentation on the components of exercise prescription. Introduces F.I.T.T principle from ACSM for exercise prescriptions.  Reviews F.I.T.T. principles of aerobic exercise including progression. Written material given at graduation.   Education: Resistance Exercise: - Group verbal and visual  presentation on the components of exercise prescription. Introduces F.I.T.T principle from ACSM for exercise prescriptions  Reviews F.I.T.T. principles of resistance exercise including progression. Written material given at graduation.    Education: Exercise & Equipment Safety: - Individual verbal instruction and demonstration of equipment use and safety with use of the equipment. Flowsheet Row Cardiac Rehab from 04/30/2020 in [facility] Cardiac and Pulmonary  Rehab  Date 04/30/20  Educator [facility]  Instruction Review Code 1- Verbalizes Understanding      Education: Exercise Physiology & General Exercise Guidelines: - Group verbal and written instruction with models to review the exercise physiology of the cardiovascular system and associated critical values. Provides general exercise guidelines with specific guidelines to those with heart or lung disease.    Education: Flexibility, Balance, Mind/Body Relaxation: - Group verbal and visual presentation with interactive activity on the components of exercise prescription. Introduces F.I.T.T principle from ACSM for exercise prescriptions. Reviews F.I.T.T. principles of flexibility and balance exercise training including progression. Also discusses the mind body connection.  Reviews various relaxation techniques to help reduce and manage stress (i.e. Deep breathing, progressive muscle relaxation, and visualization). Balance handout provided to take home. Written material given at graduation.   Activity Barriers & Risk Stratification:  Activity Barriers & Cardiac Risk Stratification - 05/04/20 1237      Activity Barriers & Cardiac Risk Stratification   Activity Barriers Shortness of Breath;Decreased Ventricular Function;Deconditioning;Muscular Weakness;Other (comment)    Comments Right shoulder- limited ROM due to rotator cuff surgery and bicep tear    Cardiac Risk Stratification High           6 Minute Walk:  6 Minute Walk    Row Name 05/04/20 1245          6 Minute Walk   Phase Initial     Distance 1000 feet     Walk Time 6 minutes     # of Rest Breaks 0     MPH 1.89     METS 2.19     RPE 11     Perceived Dyspnea  1     VO2 Peak 7.69     Symptoms No     Resting HR 98 bpm     Resting BP 112/68     Resting Oxygen Saturation  99 %     Exercise Oxygen Saturation  during 6 min walk 99 %     Max Ex. HR 116 bpm     Max Ex. BP 122/66     2 Minute Post BP 110/68            Oxygen Initial Assessment:   Oxygen Re-Evaluation:   Oxygen Discharge (Final Oxygen Re-Evaluation):   Initial Exercise Prescription:  Initial Exercise Prescription - 05/04/20 1200      Date of Initial Exercise RX and Referring Provider   Date 05/04/20    Referring Provider [name] MD      Treadmill   MPH 1.7    Grade 0.5    Minutes 15    METs 2.42      Recumbant Bike   Level 2    RPM 60    Watts 10    Minutes 15    METs 2.1      NuStep   Level 2    SPM 80    Minutes 15    METs 2.1      T5 Nustep   Level 1    SPM 80    Minutes 15    METs 2.1      Prescription Details   Frequency (times per week) 2    Duration Progress to 30 minutes of continuous aerobic without signs/symptoms of physical distress      Intensity   THRR 40-80% of Max Heartrate 116-134    Ratings of Perceived Exertion 11-13    Perceived Dyspnea 0-4  Progression   Progression Continue to progress workloads to maintain intensity without signs/symptoms of physical distress.      Resistance Training   Training Prescription Yes    Weight 3 lb    Reps 10-15           Perform Capillary Blood Glucose checks as needed.  Exercise Prescription Changes:  Exercise Prescription Changes    Row Name 05/04/20 1200             Response to Exercise   Blood Pressure (Admit) 112/68       Blood Pressure (Exercise) 122/66       Blood Pressure (Exit) 110/68       Heart Rate (Admit) 98 bpm       Heart Rate (Exercise) 116 bpm       Heart Rate (Exit)  99 bpm       Oxygen Saturation (Admit) 99 %       Oxygen Saturation (Exercise) 99 %       Oxygen Saturation (Exit) 99 %       Rating of Perceived Exertion (Exercise) 11       Perceived Dyspnea (Exercise) 1       Symptoms none       Comments walk test results              Exercise Comments:   Exercise Goals and Review:  Exercise Goals    Row Name 05/04/20 1304             Exercise Goals   Increase Physical Activity Yes       Intervention Provide advice, education, support and counseling about physical activity/exercise needs.;Develop an individualized exercise prescription for aerobic and resistive training based on initial evaluation findings, risk stratification, comorbidities and participant's personal goals.       Expected Outcomes Short Term: Attend rehab on a regular basis to increase amount of physical activity.;Long Term: Exercising regularly at least 3-5 days a week.;Long Term: Add in home exercise to make exercise part of routine and to increase amount of physical activity.       Increase Strength and Stamina Yes       Intervention Provide advice, education, support and counseling about physical activity/exercise needs.;Develop an individualized exercise prescription for aerobic and resistive training based on initial evaluation findings, risk stratification, comorbidities and participant's personal goals.       Expected Outcomes Short Term: Increase workloads from initial exercise prescription for resistance, speed, and METs.;Short Term: Perform resistance training exercises routinely during rehab and add in resistance training at home;Long Term: Improve cardiorespiratory fitness, muscular endurance and strength as measured by increased METs and functional capacity (6MWT)       Able to understand and use rate of perceived exertion (RPE) scale Yes       Intervention Provide education and explanation on how to use RPE scale       Expected Outcomes Short Term: Able to use RPE  daily in rehab to express subjective intensity level;Long Term:  Able to use RPE to guide intensity level when exercising independently       Able to understand and use Dyspnea scale Yes       Intervention Provide education and explanation on how to use Dyspnea scale       Expected Outcomes Short Term: Able to use Dyspnea scale daily in rehab to express subjective sense of shortness of breath during exertion;Long Term: Able to use Dyspnea scale to guide intensity level when  exercising independently       Knowledge and understanding of Target Heart Rate Range (THRR) Yes       Intervention Provide education and explanation of THRR including how the numbers were predicted and where they are located for reference       Expected Outcomes Short Term: Able to state/look up THRR;Short Term: Able to use daily as guideline for intensity in rehab;Long Term: Able to use THRR to govern intensity when exercising independently       Able to check pulse independently Yes       Intervention Provide education and demonstration on how to check pulse in carotid and radial arteries.;Review the importance of being able to check your own pulse for safety during independent exercise       Expected Outcomes Short Term: Able to explain why pulse checking is important during independent exercise;Long Term: Able to check pulse independently and accurately       Understanding of Exercise Prescription Yes       Intervention Provide education, explanation, and written materials on patient's individual exercise prescription       Expected Outcomes Short Term: Able to explain program exercise prescription;Long Term: Able to explain home exercise prescription to exercise independently              Exercise Goals Re-Evaluation :   Discharge Exercise Prescription (Final Exercise Prescription Changes):  Exercise Prescription Changes - 05/04/20 1200      Response to Exercise   Blood Pressure (Admit) 112/68    Blood Pressure  (Exercise) 122/66    Blood Pressure (Exit) 110/68    Heart Rate (Admit) 98 bpm    Heart Rate (Exercise) 116 bpm    Heart Rate (Exit) 99 bpm    Oxygen Saturation (Admit) 99 %    Oxygen Saturation (Exercise) 99 %    Oxygen Saturation (Exit) 99 %    Rating of Perceived Exertion (Exercise) 11    Perceived Dyspnea (Exercise) 1    Symptoms none    Comments walk test results           Nutrition:  Target Goals: Understanding of nutrition guidelines, daily intake of sodium 1500mg , cholesterol 200mg , calories 30% from fat and 7% or less from saturated fats, daily to have 5 or more servings of fruits and vegetables.  Education: All About Nutrition: -Group instruction provided by verbal, written material, interactive activities, discussions, models, and posters to present general guidelines for heart healthy nutrition including fat, fiber, MyPlate, the role of sodium in heart healthy nutrition, utilization of the nutrition label, and utilization of this knowledge for meal planning. Follow up email sent as well. Written material given at graduation.   Biometrics:  Pre Biometrics - 05/04/20 1236      Pre Biometrics   Height 6' (1.829 m)    Weight 203 lb 8 oz (92.3 kg)    BMI (Calculated) 27.59    Single Leg Stand 15.9 seconds            Nutrition Therapy Plan and Nutrition Goals:   Nutrition Assessments:  MEDIFICTS Score Key:  ?70 Need to make dietary changes   40-70 Heart Healthy Diet  ? 40 Therapeutic Level Cholesterol Diet  Flowsheet Row Cardiac Rehab from 05/04/2020 in [facility] Cardiac and Pulmonary Rehab  Picture Your Plate Total Score on Admission 62     Picture Your Plate Scores:  <73 Unhealthy dietary pattern with much room for improvement.  41-50 Dietary pattern unlikely to meet recommendations for  good health and room for improvement.  51-60 More healthful dietary pattern, with some room for improvement.   >60 Healthy dietary pattern, although there may be some  specific behaviors that could be improved.    Nutrition Goals Re-Evaluation:   Nutrition Goals Discharge (Final Nutrition Goals Re-Evaluation):   Psychosocial: Target Goals: Acknowledge presence or absence of significant depression and/or stress, maximize coping skills, provide positive support system. Participant is able to verbalize types and ability to use techniques and skills needed for reducing stress and depression.   Education: Stress, Anxiety, and Depression - Group verbal and visual presentation to define topics covered.  Reviews how body is impacted by stress, anxiety, and depression.  Also discusses healthy ways to reduce stress and to treat/manage anxiety and depression.  Written material given at graduation.   Education: Sleep Hygiene -Provides group verbal and written instruction about how sleep can affect your health.  Define sleep hygiene, discuss sleep cycles and impact of sleep habits. Review good sleep hygiene tips.    Initial Review & Psychosocial Screening:  Initial Psych Review & Screening - 04/30/20 0913      Initial Review   Current issues with None Identified      Family Dynamics   Good Support System? Yes    Comments He can look to his wife  for support. [place]s has a positive outlook on his health.      Barriers   Psychosocial barriers to participate in program There are no identifiable barriers or psychosocial needs.;The patient should benefit from training in stress management and relaxation.      Screening Interventions   Interventions To provide support and resources with identified psychosocial needs;Provide feedback about the scores to participant;Encouraged to exercise    Expected Outcomes Short Term goal: Utilizing psychosocial counselor, staff and physician to assist with identification of specific Stressors or current issues interfering with healing process. Setting desired goal for each stressor or current issue identified.;Long Term Goal:  Stressors or current issues are controlled or eliminated.;Short Term goal: Identification and review with participant of any Quality of Life or Depression concerns found by scoring the questionnaire.;Long Term goal: The participant improves quality of Life and PHQ9 Scores as seen by post scores and/or verbalization of changes           Quality of Life Scores:   Quality of Life - 05/04/20 1234      Quality of Life   Select Quality of Life      Quality of Life Scores   Health/Function Pre 20.07 %    Socioeconomic Pre 25.57 %    Psych/Spiritual Pre 30 %    Family Pre 27 %    GLOBAL Pre 24.31 %          Scores of 19 and below usually indicate a poorer quality of life in these areas.  A difference of  2-3 points is a clinically meaningful difference.  A difference of 2-3 points in the total score of the Quality of Life Index has been associated with significant improvement in overall quality of life, self-image, physical symptoms, and general health in studies assessing change in quality of life.  PHQ-9: Recent Review Flowsheet Data    Depression screen [facility] 2/9 05/04/2020   Decreased Interest 0   Down, Depressed, Hopeless 0   PHQ - 2 Score 0   Altered sleeping 2   Tired, decreased energy 1   Change in appetite 1   Feeling bad or failure about yourself  0   Trouble concentrating 0   Moving slowly or fidgety/restless 0   Suicidal thoughts 0   PHQ-9 Score 4   Difficult doing work/chores Not difficult at all     Interpretation of Total Score  Total Score Depression Severity:  1-4 = Minimal depression, 5-9 = Mild depression, 10-14 = Moderate depression, 15-19 = Moderately severe depression, 20-27 = Severe depression   Psychosocial Evaluation and Intervention:  Psychosocial Evaluation - 04/30/20 0914      Psychosocial Evaluation & Interventions   Interventions Encouraged to exercise with the program and follow exercise prescription;Stress management education;Relaxation education     Comments He can look to his wife for support. [place] has a positive outlook on his health.    Expected Outcomes Short: Exercise regularly to support mental health and notify staff of any changes. Long: maintain mental health and well being through teaching of rehab or prescribed medications independently.    Continue Psychosocial Services  Follow up required by staff           Psychosocial Re-Evaluation:   Psychosocial Discharge (Final Psychosocial Re-Evaluation):   Vocational Rehabilitation: Provide vocational rehab assistance to qualifying candidates.   Vocational Rehab Evaluation & Intervention:   Education: Education Goals: Education classes will be provided on a variety of topics geared toward better understanding of heart health and risk factor modification. Participant will state understanding/return demonstration of topics presented as noted by education test scores.  Learning Barriers/Preferences:  Learning Barriers/Preferences - 04/30/20 0910      Learning Barriers/Preferences   Learning Barriers None    Learning Preferences None           General Cardiac Education Topics:  AED/CPR: - Group verbal and written instruction with the use of models to demonstrate the basic use of the AED with the basic ABC's of resuscitation.   Anatomy and Cardiac Procedures: - Group verbal and visual presentation and models provide information about basic cardiac anatomy and function. Reviews the testing methods done to diagnose heart disease and the outcomes of the test results. Describes the treatment choices: Medical Management, Angioplasty, or Coronary Bypass Surgery for treating various heart conditions including Myocardial Infarction, Angina, Valve Disease, and Cardiac Arrhythmias.  Written material given at graduation.   Medication Safety: - Group verbal and visual instruction to review commonly prescribed medications for heart and lung disease. Reviews the medication,  class of the drug, and side effects. Includes the steps to properly store meds and maintain the prescription regimen.  Written material given at graduation.   Intimacy: - Group verbal instruction through game format to discuss how heart and lung disease can affect sexual intimacy. Written material given at graduation..   Know Your Numbers and Heart Failure: - Group verbal and visual instruction to discuss disease risk factors for cardiac and pulmonary disease and treatment options.  Reviews associated critical values for Overweight/Obesity, Hypertension, Cholesterol, and Diabetes.  Discusses basics of heart failure: signs/symptoms and treatments.  Introduces Heart Failure Zone chart for action plan for heart failure.  Written material given at graduation.   Infection Prevention: - Provides verbal and written material to individual with discussion of infection control including proper hand washing and proper equipment cleaning during exercise session. Flowsheet Row Cardiac Rehab from 04/30/2020 in [facility] Cardiac and Pulmonary Rehab  Date 04/30/20  Educator [facility]  Instruction Review Code 1- Verbalizes Understanding      Falls Prevention: - Provides verbal and written material to individual with discussion of falls prevention and safety. Flowsheet  Row Cardiac Rehab from 04/30/2020 in [facility] Cardiac and Pulmonary Rehab  Date 04/30/20  Educator [facility]  Instruction Review Code 1- Verbalizes Understanding      Other: -Provides group and verbal instruction on various topics (see comments)   Knowledge Questionnaire Score:  Knowledge Questionnaire Score - 05/04/20 1227      Knowledge Questionnaire Score   Pre Score 22/26: Angina, Nutrition, Exercise           Core Components/Risk Factors/Patient Goals at Admission:  Personal Goals and Risk Factors at Admission - 05/04/20 1307      Core Components/Risk Factors/Patient Goals on Admission    Weight Management Yes;Weight Maintenance     Intervention Weight Management: Develop a combined nutrition and exercise program designed to reach desired caloric intake, while maintaining appropriate intake of nutrient and fiber, sodium and fats, and appropriate energy expenditure required for the weight goal.;Weight Management: Provide education and appropriate resources to help participant work on and attain dietary goals.;Weight Management/Obesity: Establish reasonable short term and long term weight goals.    Admit Weight 203 lb (92.1 kg)    Goal Weight: Short Term 203 lb (92.1 kg)    Goal Weight: Long Term 203 lb (92.1 kg)    Expected Outcomes Short Term: Continue to assess and modify interventions until short term weight is achieved;Long Term: Adherence to nutrition and physical activity/exercise program aimed toward attainment of established weight goal;Weight Maintenance: Understanding of the daily nutrition guidelines, which includes 25-35% calories from fat, 7% or less cal from saturated fats, less than 200mg  cholesterol, less than 1.5gm of sodium, & 5 or more servings of fruits and vegetables daily;Understanding recommendations for meals to include 15-35% energy as protein, 25-35% energy from fat, 35-60% energy from carbohydrates, less than 200mg  of dietary cholesterol, 20-35 gm of total fiber daily;Understanding of distribution of calorie intake throughout the day with the consumption of 4-5 meals/snacks    Diabetes Yes    Intervention Provide education about signs/symptoms and action to take for hypo/hyperglycemia.;Provide education about proper nutrition, including hydration, and aerobic/resistive exercise prescription along with prescribed medications to achieve blood glucose in normal ranges: Fasting glucose 65-99 mg/dL    Expected Outcomes Short Term: Participant verbalizes understanding of the signs/symptoms and immediate care of hyper/hypoglycemia, proper foot care and importance of medication, aerobic/resistive exercise and nutrition  plan for blood glucose control.;Long Term: Attainment of HbA1C < 7%.    Heart Failure Yes    Intervention Provide a combined exercise and nutrition program that is supplemented with education, support and counseling about heart failure. Directed toward relieving symptoms such as shortness of breath, decreased exercise tolerance, and extremity edema.    Expected Outcomes Improve functional capacity of life;Short term: Attendance in program 2-3 days a week with increased exercise capacity. Reported lower sodium intake. Reported increased fruit and vegetable intake. Reports medication compliance.;Short term: Daily weights obtained and reported for increase. Utilizing diuretic protocols set by physician.;Long term: Adoption of self-care skills and reduction of barriers for early signs and symptoms recognition and intervention leading to self-care maintenance.    Hypertension Yes    Intervention Provide education on lifestyle modifcations including regular physical activity/exercise, weight management, moderate sodium restriction and increased consumption of fresh fruit, vegetables, and low fat dairy, alcohol moderation, and smoking cessation.;Monitor prescription use compliance.    Expected Outcomes Short Term: Continued assessment and intervention until BP is < 140/48mm HG in hypertensive participants. < 130/44mm HG in hypertensive participants with diabetes, heart failure or chronic kidney disease.;Long Term:  Maintenance of blood pressure at goal levels.    Lipids Yes    Intervention Provide education and support for participant on nutrition & aerobic/resistive exercise along with prescribed medications to achieve LDL 70mg , HDL >40mg .    Expected Outcomes Short Term: Participant states understanding of desired cholesterol values and is compliant with medications prescribed. Participant is following exercise prescription and nutrition guidelines.;Long Term: Cholesterol controlled with medications as  prescribed, with individualized exercise RX and with personalized nutrition plan. Value goals: LDL < 70mg , HDL > 40 mg.           Education:Diabetes - Individual verbal and written instruction to review signs/symptoms of diabetes, desired ranges of glucose level fasting, after meals and with exercise. Acknowledge that pre and post exercise glucose checks will be done for 3 sessions at entry of program. Flowsheet Row Cardiac Rehab from 04/30/2020 in [facility] Cardiac and Pulmonary Rehab  Date 04/30/20  Educator [facility]  Instruction Review Code 1- Verbalizes Understanding  [States nver been diagnosed with DM and does not check BG]      Core Components/Risk Factors/Patient Goals Review:    Core Components/Risk Factors/Patient Goals at Discharge (Final Review):    ITP Comments:  ITP Comments    Row Name 04/30/20 0914 05/04/20 1223 05/13/20 1000       ITP Comments Virtual Visit completed. Patient informed on EP and RD appointment and 6 Minute walk test. Patient also informed of patient health questionnaires on My Chart. Patient Verbalizes understanding. Visit diagnosis can be found in [facility] 03/21/2020. Completed 6MWT and gym orientation. Initial ITP created and sent for review to Dr. Emily Filbert, Medical Director. 30 Day review completed. Medical Director ITP review done, changes made as directed, and signed approval by Medical Director.            Comments:

## 2020-05-14 ENCOUNTER — Other Ambulatory Visit: Payer: Self-pay

## 2020-05-14 ENCOUNTER — Encounter: Payer: Medicare HMO | Admitting: *Deleted

## 2020-05-14 DIAGNOSIS — Z951 Presence of aortocoronary bypass graft: Secondary | ICD-10-CM

## 2020-05-14 LAB — GLUCOSE, CAPILLARY
Glucose-Capillary: 134 mg/dL — ABNORMAL HIGH (ref 70–99)
Glucose-Capillary: 108 mg/dL — ABNORMAL HIGH (ref 70–99)

## 2020-05-14 NOTE — Progress Notes (Signed)
Daily Session Note  Patient Details  Name: Roy [name] MRN: [id number] Date of Birth: 11-17-1945 Referring Provider:   Flowsheet Row Cardiac Rehab from 05/04/2020 in [facility] Cardiac and Pulmonary Rehab  Referring Provider [name] MD      Encounter Date: 05/14/2020  Check In:  Session Check In - 05/14/20 1405      Check-In   Supervising physician immediately available to respond to emergencies See telemetry face sheet for immediately available ER MD    Location [facility]-Cardiac & Pulmonary Rehab    Staff Present Renita Papa, RN BSN;Joseph Hood RCP,RRT,BSRT;Melissa [place] RDN, LDN    Virtual Visit No    Medication changes reported     No    Fall or balance concerns reported    No    Warm-up and Cool-down Performed on first and last piece of equipment    Resistance Training Performed Yes    VAD Patient? No    PAD/SET Patient? No              Social History   Tobacco Use  Smoking Status Former Smoker  . Years: 5.00  . Types: Pipe  . Quit date: 11/29/1976  . Years since quitting: 43.4  Smokeless Tobacco Never Used    Goals Met:  Independence with exercise equipment Exercise tolerated well No report of cardiac concerns or symptoms Strength training completed today  Goals Unmet:  Not Applicable  Comments: First full day of exercise!  Patient was oriented to gym and equipment including functions, settings, policies, and procedures.  Patient's individual exercise prescription and treatment plan were reviewed.  All starting workloads were established based on the results of the 6 minute walk test done at initial orientation visit.  The plan for exercise progression was also introduced and progression will be customized based on patient's performance and goals.     Dr. Emily Filbert is Medical Director for [facility].

## 2020-05-17 NOTE — Progress Notes (Signed)
Roy [name]    HEMATOLOGY/ONCOLOGY CONSULTATION NOTE  Date of Service: 05/18/2020  Patient Care Team: Roy [name] as PCP - General (Family Medicine) Roy [name] as PCP - Cardiology (Cardiology)  CHIEF COMPLAINTS/PURPOSE OF CONSULTATION:  Primary testicular large B cell lymphoma  HISTORY OF PRESENTING ILLNESS:   Roy [name] is a wonderful 75 y.o. male who has been referred to [place] by Roy [name] for evaluation and management of primary testicular large B-cell lymphoma  Patient has a history of hypertension, borderline diabetes, previous nonischemic cardiomyopathy but with a recent MI,, CHF who presented with a left testicular swelling end of sept/ early oct 2021.  He notes that he was seen by Urology - Roy [name] and had an ultrasound--possible infection was suspected and he received 2 rounds of abx without significant improvement.  He notes he received 2 subsequent follow-up ultrasounds in December in January and finally had left transinguinal orchiectomy on 03/11/2020. He notes he had persistent testicular swelling post operatively which required drainage.  He also notes having issues with urinary retention and had a urinary catheter for 2 weeks, abx+  Pathology showed primary testicular large B-cell lymphoma.  No other additional imaging studies for staging have been done at this time.  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  Patient was admitted to the hospital on 03/21/2020 with acute onset dyspnea epigastric pain and cough with pink frothy sputum.  He was noted to have hypoxia with oxygen saturation of 82% on arrival.  He was noted to have non-ST elevation MI on 2/26.  CTA chest ruled out PE.  His symptoms from fluid with aggressive diuresis.  He was noted to have severe triple-vessel disease on cath with an ejection fraction of 25 to 35% with no significant valvular lesions.  He subsequently had CORONARY ARTERY  BYPASS GRAFTING (CABG), ON PUMP, TIMES FOUR, USING BILATERAL INTERNAL MAMMARY ARTERIES AND LEFT RADIAL ARTERY .. On March 25, 2020.  Patient notes he is gradually recovering from his cardiac surgery notes being fatigued having gone through multiple urologic issues, skin surgery for squamous of carcinoma of the nose, acute MI CABG x4.  Patient notes no fevers no chills no night sweats. He notes he might have lost some weight but difficult to quantify in the setting of fluid overload and diuresis. Notes he is starting to eat a little better. Minimal left scrotal swelling. No other acute new focal symptoms.  INTERVAL HISTORY  Roy [name] is a wonderful 75 y.o. male who is here today for evaluation and management of Primary testicular large B cell lymphoma. The patient's last visit with [place] was on 04/27/2020. The pt reports that he is doing well overall. We are joined today by his wife.  The pt reports that he has been doing well since the last visit. The pt notes he has started cardiac rehab and this is going well. He is going twice weekly from this week until the end of June. The pt notes that he still experiences difficulty sleeping due to waking up and tightness in his chest and airways. The pt feels as though he is not getting enough air. This is only during the night and is not worsened by the cardiac rehab. The pt notes he is only able to sleep while at a 90 degree angle and on his side. He gets SOB when he sleeps on his back. The pt notes he currently eats twice daily and  is not experiencing decreased appetite. The pt also eats a snack daily and notes he has been getting his taste buds back since surgery.  Of note since the patient's last visit, pt has had PET Skull Base to Thigh ([id number]) on 05/11/2020, which revealed "Negative PET-CT for metabolically active lymphadenopathy in the neck, chest, abdomen or pelvis."  On review of systems, pt reports continued difficulty sleeping, SOB when  laying down and denies leg swelling, scrotal swelling, decreased appetite, acute weight loss, new lumps/bumps, and any other symptoms.  MEDICAL HISTORY:  Past Medical History:  Diagnosis Date  . Allergic rhinitis   . Arthritis    wrists  . Cardiomyopathy, nonischemic [facility])    followed by cardiology--- Roy Roy [name]---  2016 ef 45-50% ,  2016 nuclear ef 37%,  2017 per echo ef 50-55%  . CHF (congestive heart failure), NYHA class III ([place]) 03/21/2020  . GERD (gastroesophageal reflux disease)   . Hiatal hernia   . History of kidney stones   . 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 History of syncope (03-06-2020 pt stated has not had sycopal episode in few years, stated it seems to happen in extreme hot conditions)   cardiologist--- Roy Roy [name]--- dx recurrent syncope;  nuclear study 11-03-2014 intermediate risk w/ no ischemia, apical hypokinesis, nuclear ef 37%;  event monitor-- 12-21-2015 SB/ ST  no pauses/ arrythmia's;  echo 08-03-2015 ef 50-55%  . Hyperlipidemia   . Hypertension    followed by pcp  . Hypovitaminosis D   . Mass of left testicle   . Nocturia   . Plantar fasciitis   . Presence of surgical incision    01/ 2022  moh's w/ skin graft of nose, per pt still healing and wear bandage daily  . Type 2 diabetes mellitus ([place])    pt is adament that he is not and have been told he is a diabetic but a borderline;  followed by pcp, in pcp note states DM2 and takes 2 meds daily  . Wears glasses   . Wears hearing aid in both ears     SURGICAL HISTORY: Past Surgical History:  Procedure Laterality Date  . COLONOSCOPY  last one 01-30-2017  . CORONARY ARTERY BYPASS GRAFT N/A 03/25/2020   Procedure: CORONARY ARTERY BYPASS GRAFTING (CABG), ON PUMP, TIMES FOUR, USING BILATERAL INTERNAL MAMMARY ARTERIES AND LEFT RADIAL ARTERY;  Surgeon: [name], [name];  Location: [place];  Service: Open Heart Surgery;   Laterality: N/A;  . LOW ANTERIOR RESECTION RECTUM W/ COLOPROCTOSTOMY  05/2003  . MOHS SURGERY  01/2020   nose w/ graft  . ORCHIECTOMY Left 03/11/2020   Procedure: [place];  Surgeon: [name], [name];  Location[facility];  Service: Urology;  Laterality: Left;  ONLY NEEDS 60 MIN  . RADIAL ARTERY HARVEST Left 03/25/2020   Procedure: LEFT RADIAL ARTERY HARVEST;  Surgeon: [name], [name];  Location: [place];  Service: Open Heart Surgery;  Laterality: Left;  . RIGHT/LEFT HEART CATH AND CORONARY ANGIOGRAPHY N/A 03/23/2020   Procedure: RIGHT/LEFT HEART CATH AND CORONARY ANGIOGRAPHY;  Surgeon: [name], [name];  Location: [place] [facility];  Service: Cardiovascular;  Laterality: N/A;  . SHOULDER SURGERY Right 1992; 07/ 2021  . 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 TEE WITHOUT CARDIOVERSION N/A  03/25/2020   Procedure: TRANSESOPHAGEAL ECHOCARDIOGRAM (TEE);  Surgeon: [name], [name];  Location: [place];  Service: Open Heart Surgery;  Laterality: N/A;  . TONSILLECTOMY AND ADENOIDECTOMY  child  . UPPER GASTROINTESTINAL ENDOSCOPY  last one 06-06-2017    SOCIAL HISTORY: Social History   Socioeconomic History  . Marital status: Married    Spouse name: Not on file  . Number of children: Not on file  . Years of education: Not on file  . Highest education level: Not on file  Occupational History  . Not on file  Tobacco Use  . Smoking status: Former Smoker    Years: 5.00    Types: Pipe    Quit date: 11/29/1976    Years since quitting: 43.4  . Smokeless tobacco: Never Used  Vaping Use  . Vaping Use: Never used  Substance and Sexual Activity  . Alcohol use: Yes    Alcohol/week: 0.0 standard drinks    Comment: Occasional drink   . Drug use: Never  . Sexual activity: Not on file  Other Topics Concern  . Not on file  Social History Narrative  . Not on file   Social Determinants of Health    Financial Resource Strain: Not on file  Food Insecurity: Not on file  Transportation Needs: Not on file  Physical Activity: Not on file  Stress: Not on file  Social Connections: Not on file  Intimate Partner Violence: Not on file    FAMILY HISTORY: Family History  Problem Relation Age of Onset  . Heart attack Mother   . Stroke Father   . Colon cancer Neg Hx   . Esophageal cancer Neg Hx   . Pancreatic cancer Neg Hx   . Rectal cancer Neg Hx   . Stomach cancer Neg Hx   . Colon polyps Neg Hx     ALLERGIES:  has No Known Allergies.  MEDICATIONS:  Current Outpatient Medications  Medication Sig Dispense Refill  . acetaminophen (TYLENOL) 325 MG tablet Take 2 tablets (650 mg total) by mouth every 4 (four) hours as needed for headache or mild pain.    Roy [name] Apoaequorin (PREVAGEN PO) Take 1 tablet by mouth at bedtime.    Roy [name] aspirin EC 81 MG tablet Take 81 mg by mouth daily. Swallow whole.    . carvedilol (COREG) 6.25 MG tablet Take 2 tablets (12.5 mg total) by mouth in the [name] AND 1 tablet (6.25 mg total) every evening. 180 tablet 1  . cholecalciferol (VITAMIN D) 1000 UNITS tablet Take 1,000 Units by mouth See admin instructions. Mon-friday    . furosemide (LASIX) 80 MG tablet Take 1 tablet (80 mg total) by mouth daily. 30 tablet 11  . glucosamine-chondroitin 500-400 MG tablet Take 1 tablet by mouth. 5 times weekly    . lisinopril (ZESTRIL) 5 MG tablet Take 1 tablet (5 mg total) by mouth daily. 30 tablet 3  . metFORMIN (GLUCOPHAGE) 1000 MG tablet Take 1,000 mg by mouth 2 (two) times daily with a meal.     . Multiple Vitamin (MULTIVITAMIN WITH MINERALS) TABS tablet Take 1 tablet by mouth. 5 times weekly    . omeprazole (PRILOSEC) 20 MG capsule Take 20 mg by mouth daily.    . pioglitazone (ACTOS) 30 MG tablet Take 30 mg by mouth daily.    . pravastatin (PRAVACHOL) 80 MG tablet TAKE 1 TABLET (80 MG TOTAL) BY MOUTH DAILY. 90 tablet 1  . tamsulosin (FLOMAX) 0.4 MG CAPS capsule Take 0.4 mg  by mouth  at bedtime.    . vitamin B-12 (CYANOCOBALAMIN) 1000 MCG tablet Take 1,000 mcg by mouth. 5 times a week    . COVID-19 mRNA vaccine, Pfizer, 30 MCG/0.3ML injection USE AS DIRECTED .3 mL 0  . loperamide (IMODIUM) 2 MG capsule Take 1 capsule (2 mg total) by mouth as needed for diarrhea or loose stools. (Patient not taking: Reported on 05/18/2020) 30 capsule 0  . traMADol (ULTRAM) 50 MG tablet Take 1 tablet (50 mg total) by mouth every 4 (four) hours as needed for moderate pain. (Patient not taking: Reported on 05/18/2020) 30 tablet 0  . UNABLE TO FIND Take 1 tablet by mouth 2 (two) times a day. Study cholesterol drug if drug name needed please call April McCaskill at [phone]     Current Facility-Administered Medications  Medication Dose Route Frequency Provider Last Rate Last Admin  . potassium chloride (KLOR-CON) CR tablet 20 mEq  20 mEq Oral BID [name], [name]        REVIEW OF SYSTEMS:    10 Point review of Systems was done is negative except as noted above.  PHYSICAL EXAMINATION: ECOG PERFORMANCE STATUS: 2 - Symptomatic, <50% confined to bed  . Vitals:   05/18/20 1021  BP: 108/73  Pulse: 92  Resp: 18  Temp: 97.9 F (36.6 C)  SpO2: 100%   Filed Weights   05/18/20 1021  Weight: 205 lb 3.2 oz (93.1 kg)   .Body mass index is 27.07 kg/m.  Exam was given in a chair.  GENERAL:alert, in no acute distress and comfortable SKIN: no acute rashes, no significant lesions EYES: conjunctiva are pink and non-injected, sclera anicteric OROPHARYNX: MMM, no exudates, no oropharyngeal erythema or ulceration NECK: supple, no JVD LYMPH:  no palpable lymphadenopathy in the cervical, axillary or inguinal regions LUNGS: clear to auscultation b/l with normal respiratory effort, healing incision of CABG. HEART: regular rate & rhythm ABDOMEN:  normoactive bowel sounds , non tender, not distended.  Healed left inguinal incision and some mild scrotal swelling on the left  side Extremity: 1+ leg swelling b/l. PSYCH: alert & oriented x 3 with fluent speech NEURO: no focal motor/sensory deficits  LABORATORY DATA:  I have reviewed the data as listed  . CBC Latest Ref Rng & Units 04/27/2020 04/23/2020 03/31/2020  WBC 4.0 - 10.5 K/uL 8.3 10.0 10.6(H)  Hemoglobin 13.0 - 17.0 g/dL 10.7(L) 10.4(L) 9.3(L)  Hematocrit 39.0 - 52.0 % 33.8(L) 32.8(L) 28.2(L)  Platelets 150 - 400 K/uL 333 346 312    . CMP Latest Ref Rng & Units 04/27/2020 04/23/2020 04/01/2020  Glucose 70 - 99 mg/dL 124(H) 145(H) 117(H)  BUN 8 - 23 mg/dL _0 Creatinine 0.61 - 1.24 mg/dL 1.13 1.17 1.17  Sodium 135 - 145 mmol/L 142 140 133(L)  Potassium 3.5 - 5.1 mmol/L 4.1 4.4 4.6  Chloride 98 - 111 mmol/L 102 105 103  CO2 22 - 32 mmol/L 25 25 20(L)  Calcium 8.9 - 10.3 mg/dL 8.2(L) 8.6 7.9(L)  Total Protein 6.5 - 8.1 g/dL 7.1 - -  Total Bilirubin 0.3 - 1.2 mg/dL 0.6 - -  Alkaline Phos 38 - 126 U/L 81 - -  AST 15 - 41 U/L 12(L) - -  ALT 0 - 44 U/L 12 - -   . Lab Results  Component Value Date   LDH 171 04/27/2020   SURGICAL PATHOLOGY   THIS IS AN ADDENDUM REPORT   CASE: [id number]  PATIENT: Roy [name]  Surgical Pathology Report  Addendum  Reason for Addendum #1: Academic librarian, FISH   Clinical History: Left testicular mass (crm)      FINAL MICROSCOPIC DIAGNOSIS:   A. TESTICLE, LEFT, ORCHIECTOMY:  - Diffuse aggressive large B-cell lymphoma  - See comment    COMMENT:   Sections of testicle show architectural effacement by sheets of large  lymphoid cells with vesicular nuclei and pale cytoplasm. There is  admixed apoptotic debris and increased mitotic activity. By  immunohistochemistry, the large lymphoid cells are positive for CD20,  CD5 (dim), BCL6 (dim), MUM1, and BCL2. They are negative for CD10, CD30  (<1%), cyclin D1, and TdT. The Ki67 proliferation index is up to  approximately 70%. CD3 highlights small T-cells in the background. EBV  is  negative by in situ hybridization.   Together, the findings support the diagnosis of a diffuse aggressive  large B-cell lymphoma. The differential diagnosis includes a diffuse  large B-cell lymphoma, NOS, with activated B-cell subtype by the [facility]  algorithm and high-grade B-cell lymphoma with MYC and BCL2 or BCL6  rearrangements. FISH for BCL2, BCL6, and MYC rearrangements will be  performed. Correlation with clinical and radiographic findings is  recommended for consideration of a primary testicular lymphoma.   Result reported to L. Gibson on 03/16/20 at 1720 by S. O'Neill.    ADDENDUM:   FISH RESULTS:   Results: NORMAL   Interpretation:   BCL6 rearrangement:   Not Detected  MYC rearrangement: Not Detected  MYC amplification: Not Detected  BCL6 rearrangement:   Not Detected   ** Please note this testing was performed and interpreted by an outside  facility ([facility]). This addendum is only being added to provide a  summary of the results for report completeness. Please see electronic  medical record for a copy of the full report.**    RADIOGRAPHIC STUDIES: I have personally reviewed the radiological images as listed and agreed with the findings in the report. DG Chest 2 View  Result Date: 04/23/2020 CLINICAL DATA:  Pleurisy. EXAM: CHEST - 2 VIEW COMPARISON:  April 13, 2020 FINDINGS: Stable postsurgical changes from CABG. Enlarged cardiac silhouette.  Mediastinal contours appear intact. Bilateral small to moderate pleural effusions. Bibasilar peribronchial airspace consolidation versus atelectasis. Osseous structures are without acute abnormality. Soft tissues are grossly normal. IMPRESSION: 1. Bilateral small to moderate pleural effusions. 2. Bibasilar peribronchial airspace consolidation versus atelectasis. Electronically Signed   By: Fidela Salisbury M.D.   On: 04/23/2020 16:01   NM PET Image Initial (PI) Skull Base To Thigh  Result Date:  05/11/2020 CLINICAL DATA:  Initial treatment strategy for B-cell lymphoma the testicle. EXAM: NUCLEAR MEDICINE PET SKULL BASE TO THIGH TECHNIQUE: 10.0 mCi F-18 FDG was injected intravenously. Full-ring PET imaging was performed from the skull base to thigh after the radiotracer. CT data was obtained and used for attenuation correction and anatomic localization. Fasting blood glucose: 146 mg/dl COMPARISON:  Testicular ultrasound from 02/27/2020 FINDINGS: Mediastinal blood pool activity: SUV max 2.64 Liver activity: SUV max 3.22 NECK: No hypermetabolic lymph nodes in the neck. Incidental CT findings: none CHEST: Scattered mediastinal and hilar lymph nodes without significant hypermetabolism. Right paratracheal node on image 50/4 measures 10 mm and the SUV max is 2.70. 11 mm subcarinal lymph node has an SUV max of 2.40. No supraclavicular or axillary adenopathy. No worrisome pulmonary lesions. There are moderate-sized bilateral pleural effusions noted. Incidental CT findings: Advanced three-vessel coronary artery calcifications and evidence of prior coronary artery bypass surgery. ABDOMEN/PELVIS: No abnormal hypermetabolic activity within the  liver, pancreas, adrenal glands, or spleen. No hypermetabolic lymph nodes in the abdomen or pelvis. No splenomegaly or diffuse increased metabolic activity. Incidental CT findings: Aortic calcifications but no aneurysm. Small bilateral renal calculi. Small gallstones. Surgical changes from a left orchiectomy. No inguinal adenopathy. SKELETON: No focal hypermetabolic activity to suggest skeletal metastasis. Incidental CT findings: none IMPRESSION: Negative PET-CT for metabolically active lymphadenopathy in the neck, chest, abdomen or pelvis. Electronically Signed   By: Marijo Sanes M.D.   On: 05/11/2020 16:58   ECHOCARDIOGRAM COMPLETE  Result Date: 05/12/2020    ECHOCARDIOGRAM REPORT   Patient Name:   Roy [name] Date of Exam: 05/12/2020 Medical Rec #:  [id number]     Height:        73.0 in Accession #:    [id number]    Weight:       200.0 lb Date of Birth:  [birth year]      BSA:          2.152 m Patient Age:    72 years      BP:           116/76 mmHg Patient Gender: M             HR:           87 bpm. Exam Location:  Outpatient Procedure: 2D Echo, Cardiac Doppler, Color Doppler and Strain Analysis                                 MODIFIED REPORT:     This report was modified by Rudean Haskell [name] on 05/12/2020 due to                         Comparison to prior study added.  Indications:     Chemo Z09, Myocardial Infact I21,9  History:         Patient has prior history of Echocardiogram examinations, most                  recent 03/25/2020. CHF and Cardiomyopathy; Risk Factors:Diabetes,                  Hypertension and Dyslipidemia.  Sonographer:     Bernadene Person RDCS Referring Phys:  [id number] Brunetta Genera Diagnosing Phys: Rudean Haskell [name] IMPRESSIONS  1. Left ventricular ejection fraction, by estimation, is 20 to 25%. Left ventricular ejection fraction by 2D MOD biplane is 21.8 %. The left ventricle has severely decreased function. The left ventricle demonstrates regional wall motion abnormalities with global hypokinesis, worse in the inferoseptal, inferior, and inferolateral distibution. This is confirmed with regional strain mapping. Left ventricular diastolic parameters are indeterminate. Elevated left atrial pressure. The average left ventricular global longitudinal strain is -10.7 %. The global longitudinal strain is abnormal.  2. Right ventricular systolic function is mildly reduced. The right ventricular size is normal. There is mildly elevated pulmonary artery systolic pressure.  3. Left atrial size was mild to moderately dilated.  4. The mitral valve is normal in structure. Trivial mitral valve regurgitation.  5. The aortic valve is tricuspid. There is mild calcification of the aortic valve. There is mild thickening of the aortic valve. Aortic valve regurgitation  is not visualized. Mild aortic valve sclerosis is present, with no evidence of aortic valve stenosis. Comparison(s): A prior study was performed on 03/22/20. Prior images reviewed side by side. Slight decrease in  LV and RV function. FINDINGS  Left Ventricle: Left ventricular ejection fraction, by estimation, is 20 to 25%. Left ventricular ejection fraction by 2D MOD biplane is 21.8 %. The left ventricle has severely decreased function. The left ventricle demonstrates regional wall motion abnormalities. The average left ventricular global longitudinal strain is -10.7 %. The global longitudinal strain is abnormal. The left ventricular internal cavity size was normal in size. There is no left ventricular hypertrophy. Left ventricular diastolic parameters are indeterminate. Elevated left atrial pressure.  LV Wall Scoring: The entire inferior wall, posterior wall, mid inferoseptal segment, and basal inferoseptal segment are akinetic. The entire anterior wall, antero-lateral wall, entire anterior septum, apical lateral segment, and apex are hypokinetic. Right Ventricle: The right ventricular size is normal. No increase in right ventricular wall thickness. Right ventricular systolic function is mildly reduced. There is mildly elevated pulmonary artery systolic pressure. The tricuspid regurgitant velocity  is 2.83 m/s, and with an assumed right atrial pressure of 8 mmHg, the estimated right ventricular systolic pressure is 62.3 mmHg. Left Atrium: Left atrial size was mild to moderately dilated. Right Atrium: Right atrial size was normal in size. Pericardium: There is no evidence of pericardial effusion. Mitral Valve: The mitral valve is normal in structure. Trivial mitral valve regurgitation. Tricuspid Valve: The tricuspid valve is normal in structure. Tricuspid valve regurgitation is mild. Aortic Valve: The aortic valve is tricuspid. There is mild calcification of the aortic valve. There is mild thickening of the aortic  valve. Aortic valve regurgitation is not visualized. Mild aortic valve sclerosis is present, with no evidence of aortic valve stenosis. Pulmonic Valve: The pulmonic valve was not well visualized. Pulmonic valve regurgitation is not visualized. No evidence of pulmonic stenosis. Aorta: The aortic root and ascending aorta are structurally normal, with no evidence of dilitation. Venous: The inferior vena cava was not well visualized. IAS/Shunts: The atrial septum is grossly normal.  LEFT VENTRICLE PLAX 2D                        Biplane EF (MOD) LVIDd:         5.70 cm         LV Biplane EF:   Left LVIDs:         5.10 cm                          ventricular LV PW:         0.90 cm                          ejection LV IVS:        0.90 cm                          fraction by LVOT diam:     2.50 cm                          2D MOD LV SV:         59                               biplane is LV SV Index:   27  21.8 %. LVOT Area:     4.91 cm                                 2D LV Volumes (MOD)               Longitudinal LV vol d, MOD    183.0 ml      Strain A2C:                           2D Strain GLS  -10.8 % LV vol d, MOD    216.0 ml      (A2C): A4C:                           2D Strain GLS  -9.5 % LV vol s, MOD    139.0 ml      (A3C): A2C:                           2D Strain GLS  -11.6 % LV vol s, MOD    173.0 ml      (A4C): A4C:                           2D Strain GLS  -10.7 % LV SV MOD A2C:   44.0 ml       Avg: LV SV MOD A4C:   216.0 ml LV SV MOD BP:    44.6 ml RIGHT VENTRICLE TAPSE (M-mode): 1.0 cm LEFT ATRIUM             Index       RIGHT ATRIUM           Index LA diam:        5.10 cm 2.37 cm/m  RA Area:     17.00 cm LA Vol (A2C):   84.2 ml 39.13 ml/m RA Volume:   39.90 ml  18.54 ml/m LA Vol (A4C):   87.5 ml 40.67 ml/m LA Biplane Vol: 87.5 ml 40.67 ml/m  AORTIC VALVE LVOT Vmax:   67.40 cm/s LVOT Vmean:  44.633 cm/s LVOT VTI:    0.119 m  AORTA Ao Root diam: 3.40 cm Ao Asc diam:  3.60 cm  TRICUSPID VALVE TR Peak grad:   32.0 mmHg TR Vmax:        283.00 cm/s  SHUNTS Systemic VTI:  0.12 m Systemic Diam: 2.50 cm Rudean Haskell [name] Electronically signed by Rudean Haskell [name] Signature Date/Time: 05/12/2020/10:40:23 AM    Final (Updated)     ASSESSMENT & PLAN:   75 year old wonderful gentleman with history of hypertension, borderline diabetes, dyslipidemia, GERD with recent STEMI on 03/21/2020 and status post four-vessel CABG on 03/25/2020.  1) Left-sided primary testicular large B-cell lymphoma. Staging to be determined. BCL-2, BCL 6, c-Myc negative.  2) recent acute myocardial infarction status post CABG x4 3) nonischemic and ischemic cardiomyopathy ejection fraction 25 to 30% on last echo. 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 Blood chemistries stable. HIV and Hepatitis testing negative. -Discussed pt PET Skull Base to Thigh ([id number]) on  05/11/2020; no obvious signs of active Lymphoma. Spleen normal, only age-related changes. 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 Typical treatments for early stage the pt has (Stage 1) will come back without additional treatments. Standard treatment would be six cycles of chemotherapy and immunotherapy combination. Addition of four treatments of low dose chemo into the spinal fluid followed by radiation into the other testes. This is the standard regiment. -Discussed best regiment for pt given ongoing medical issues and risk management-- Advised pt we cannot use standard treatment given cardiac issues and function. Fluids with chemo would make the pt increasingly SOB and potential of heart failure. 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 Would need to [name] major dose reductions and very close monitoring if proceeding with treatment. 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 -Discussed prioritization of current medical issues given potential risks of each. Advised pt to think about what the current most prominent issues are in relation to goals of care and diagnosis for life. -Discussed option of choosing treatment given risks versus just monitoring and doing local radiation and not seeking chemotherapy at this time. The pt wishes to think more about this prior to making a firm decision and discuss with his Cardiologist regarding current cardiac stance and function. The pt notes he wishes to prolong his life as long as possible while staying as healthy as possible. -Advised pt that main areas of recurrence and concern for testicular lymphoma is lymph nodes in the abdomen and brain. -Advised pt that we would use R-COP at very significant dose reduction as opposed to standard R-CHOP. Will eliminate the obviously heart toxic medications. Would limit amount of fluids received and increase cardiac visits while on treatment. Continue dose monitoring with each cycle gradually. Would give GF shot on D3 treatment. -Discussed Evusheld and pt's eligibility. Will send referral. Advised pt to wait around one month after this before getting the second booster shot. -Recommended pt receive the second COVID booster shot as recently approved. Advised pt to wait 4-6 months following first booster shot before getting this. -Continue follow-up with primary, urology and cardiology for continued management of other medical issues. -Recommended pt increase Lasix to once daily. Discuss with cardiologist. -Will see back after pt  discussed treatment decision with wife.  No orders of the defined types were placed in this encounter.   FOLLOW UP: Referral for Evusheld Patient will call to determine treatment plan   All of the patients questions were answered with apparent satisfaction. The patient knows to call the clinic with any problems, questions or concerns.  The total time spent in the appointment was 40 minutes and more than 50% was on counseling and direct patient cares.    Sullivan Lone [name] [place] AAHIVMS [facility] Hematology/Oncology Physician [facility]  (Office):       [phone] (Work cell):  [phone] (Fax):           [phone]  05/18/2020 11:37 AM  I, Reinaldo Raddle, am acting as scribe for Roy. Sullivan Lone, [name].   .I have reviewed the above documentation for accuracy and completeness, and I agree with the above. Brunetta Genera [name]

## 2020-05-18 ENCOUNTER — Inpatient Hospital Stay: Payer: Medicare HMO | Admitting: Hematology

## 2020-05-18 ENCOUNTER — Other Ambulatory Visit: Payer: Self-pay

## 2020-05-18 VITALS — BP 108/73 | HR 92 | Temp 97.9°F | Resp 18 | Ht 73.0 in | Wt 205.2 lb

## 2020-05-18 DIAGNOSIS — I428 Other cardiomyopathies: Secondary | ICD-10-CM | POA: Diagnosis not present

## 2020-05-18 DIAGNOSIS — I255 Ischemic cardiomyopathy: Secondary | ICD-10-CM | POA: Diagnosis not present

## 2020-05-18 DIAGNOSIS — C8330 Diffuse large B-cell lymphoma, unspecified site: Secondary | ICD-10-CM

## 2020-05-18 DIAGNOSIS — E119 Type 2 diabetes mellitus without complications: Secondary | ICD-10-CM | POA: Diagnosis not present

## 2020-05-18 DIAGNOSIS — E785 Hyperlipidemia, unspecified: Secondary | ICD-10-CM | POA: Diagnosis not present

## 2020-05-18 DIAGNOSIS — Z951 Presence of aortocoronary bypass graft: Secondary | ICD-10-CM

## 2020-05-18 DIAGNOSIS — K219 Gastro-esophageal reflux disease without esophagitis: Secondary | ICD-10-CM | POA: Diagnosis not present

## 2020-05-18 DIAGNOSIS — I252 Old myocardial infarction: Secondary | ICD-10-CM | POA: Diagnosis not present

## 2020-05-18 DIAGNOSIS — D649 Anemia, unspecified: Secondary | ICD-10-CM | POA: Diagnosis not present

## 2020-05-18 DIAGNOSIS — C8335 Diffuse large B-cell lymphoma, lymph nodes of inguinal region and lower limb: Secondary | ICD-10-CM | POA: Diagnosis not present

## 2020-05-18 DIAGNOSIS — I10 Essential (primary) hypertension: Secondary | ICD-10-CM | POA: Diagnosis not present

## 2020-05-18 LAB — GLUCOSE, CAPILLARY
Glucose-Capillary: 121 mg/dL — ABNORMAL HIGH (ref 70–99)
Glucose-Capillary: 125 mg/dL — ABNORMAL HIGH (ref 70–99)

## 2020-05-18 NOTE — Progress Notes (Signed)
Daily Session Note  Patient Details  Name: JAHKARI MACLIN MRN: [id number] Date of Birth: 01-Jun-1945 Referring Provider:   Flowsheet Row Cardiac Rehab from 05/04/2020 in [facility] Cardiac and Pulmonary Rehab  Referring Provider [name] MD      Encounter Date: 05/18/2020  Check In:  Session Check In - 05/18/20 1405      Check-In   Supervising physician immediately available to respond to emergencies See telemetry face sheet for immediately available ER MD    Location [facility]-Cardiac & Pulmonary Rehab    Staff Present Birdie Sons, MPA, Mauricia Area, BS, ACSM CEP, Exercise Physiologist;Kara Eliezer Bottom, MS Exercise Physiologist    Virtual Visit No    Medication changes reported     No    Fall or balance concerns reported    No    Warm-up and Cool-down Performed on first and last piece of equipment    Resistance Training Performed Yes    VAD Patient? No    PAD/SET Patient? No      Pain Assessment   Currently in Pain? No/denies              Social History   Tobacco Use  Smoking Status Former Smoker  . Years: 5.00  . Types: Pipe  . Quit date: 11/29/1976  . Years since quitting: 43.4  Smokeless Tobacco Never Used    Goals Met:  Independence with exercise equipment Exercise tolerated well No report of cardiac concerns or symptoms Strength training completed today  Goals Unmet:  Not Applicable  Comments: Pt able to follow exercise prescription today without complaint.  Will continue to monitor for progression.    Dr. Emily Filbert is Medical Director for [place] and [facility].

## 2020-05-19 ENCOUNTER — Telehealth: Payer: Self-pay | Admitting: Cardiovascular Disease

## 2020-05-19 ENCOUNTER — Telehealth: Payer: Self-pay | Admitting: Hematology

## 2020-05-19 DIAGNOSIS — Z951 Presence of aortocoronary bypass graft: Secondary | ICD-10-CM

## 2020-05-19 NOTE — Progress Notes (Signed)
Completed initial RD Consultation °

## 2020-05-19 NOTE — Telephone Encounter (Signed)
Patient called to say that he has been diagnosis with Lymphoma Cancer and his Dr. Ellender Hose to start him with chemo therapy. Patients states that he has concern/question with the medication he already own. Please advise

## 2020-05-19 NOTE — Telephone Encounter (Signed)
Pt reports that Dr. Irene Limbo wants to do chemotherapy for lymphoma and he and Dr. Irene Limbo have concerns about his heart and how it is doing.   He is to follow with Dr. [name] as he was previously a patient of Dr. [name].  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 Cleaver, NP since then but had not been dx w lymphoma at that time.    Dr. Irene Limbo did echo 4/19.  The patient does not wish to begin treatment until he gets input from cardiology as to his heart function/status, and risks as it relates to his heart specifically.  I assured pt I would forward this information to his cardiologist and PharmD for input and someone will follow up with him at that point.  He states this is of an urgent matter.

## 2020-05-19 NOTE — Telephone Encounter (Signed)
It is really difficult for me to make an assessment like this over the phone.  I have never met this patient.  I would recommend he be evaluated in the office as soon as possible.  I do agree that his cancer is rather urgent.  I think a treatment plan could be made however I am uncomfortable to do this without ever meeting the patient.  [place] T. [name], MD, [place]  [address], [place], [place] [address] [phone]  2:38 PM

## 2020-05-19 NOTE — Telephone Encounter (Signed)
Scheduled follow-up appointment per 4/25 los. Patient is aware. ?

## 2020-05-20 ENCOUNTER — Other Ambulatory Visit: Payer: Self-pay

## 2020-05-20 DIAGNOSIS — Z951 Presence of aortocoronary bypass graft: Secondary | ICD-10-CM | POA: Diagnosis not present

## 2020-05-20 LAB — GLUCOSE, CAPILLARY
Glucose-Capillary: 147 mg/dL — ABNORMAL HIGH (ref 70–99)
Glucose-Capillary: 124 mg/dL — ABNORMAL HIGH (ref 70–99)

## 2020-05-20 NOTE — Telephone Encounter (Signed)
Called patient, advised of message from MD.  Patient is scheduled for 05/04 at 9:20 AM. Patient thankful to be seen sooner than June.   Will route to MD as FYI.  Thanks!

## 2020-05-20 NOTE — Progress Notes (Signed)
Daily Session Note  Patient Details  Name: SELASSIE SPATAFORE MRN: [id number] Date of Birth: Jun 09, 1945 Referring Provider:   Flowsheet Row Cardiac Rehab from 05/04/2020 in [facility] Cardiac and Pulmonary Rehab  Referring Provider [name] MD      Encounter Date: 05/20/2020  Check In:  Session Check In - 05/20/20 1414      Check-In   Supervising physician immediately available to respond to emergencies See telemetry face sheet for immediately available ER MD    Location [facility]-Cardiac & Pulmonary Rehab    Staff Present Birdie Sons, MPA, RN;Joseph Lou Miner, [place] Exercise Physiologist    Virtual Visit No    Medication changes reported     No    Fall or balance concerns reported    No    Warm-up and Cool-down Performed on first and last piece of equipment    Resistance Training Performed Yes    VAD Patient? No    PAD/SET Patient? No      Pain Assessment   Currently in Pain? No/denies              Social History   Tobacco Use  Smoking Status Former Smoker  . Years: 5.00  . Types: Pipe  . Quit date: 11/29/1976  . Years since quitting: 43.5  Smokeless Tobacco Never Used    Goals Met:  Independence with exercise equipment Exercise tolerated well No report of cardiac concerns or symptoms Strength training completed today  Goals Unmet:  Not Applicable  Comments: Pt able to follow exercise prescription today without complaint.  Will continue to monitor for progression.    Dr. Emily Filbert is Medical Director for [place] and [facility].

## 2020-05-25 ENCOUNTER — Other Ambulatory Visit: Payer: Self-pay

## 2020-05-25 ENCOUNTER — Encounter: Payer: Medicare HMO | Attending: Cardiovascular Disease

## 2020-05-25 DIAGNOSIS — Z951 Presence of aortocoronary bypass graft: Secondary | ICD-10-CM | POA: Insufficient documentation

## 2020-05-25 NOTE — Progress Notes (Signed)
Daily Session Note  Patient Details  Name: Roy [name] MRN: [id number] Date of Birth: 12-16-1945 Referring Provider:   Flowsheet Row Cardiac Rehab from 05/04/2020 in [facility] Cardiac and Pulmonary Rehab  Referring Provider [name] MD      Encounter Date: 05/25/2020  Check In:      Social History   Tobacco Use  Smoking Status Former Smoker  . Years: 5.00  . Types: Pipe  . Quit date: 11/29/1976  . Years since quitting: 43.5  Smokeless Tobacco Never Used    Goals Met:  Independence with exercise equipment Exercise tolerated well No report of cardiac concerns or symptoms Strength training completed today  Goals Unmet:  Not Applicable  Comments: Pt able to follow exercise prescription today without complaint.  Will continue to monitor for progression.    Dr. Emily Filbert is Medical Director for [place] [facility].

## 2020-05-26 NOTE — Progress Notes (Signed)
Cardiology Office Note:   Date:  05/27/2020  NAME:  Roy [name]    MRN: [id number] DOB:  1945-04-12   PCP:  [name], DO  Cardiologist:  [name], MD  Electrophysiologist:  None   Referring MD: [name], DO   Chief Complaint  Patient presents with  . Coronary Artery Disease   History of Present Illness:   Roy [name] is a 75 y.o. male with a hx of CAD s/p CABG, ischemic CM, testicular lymphoma, HLD who presents for follow-up. Admitted in February for NSTEMI and found to have 3vCAD and CHF. S/p CABG. Now with testicular lymphoma requiring chemotherapy.   Echocardiogram was ordered roughly 1 month after cardiac surgery which shows his EF is still 25%.  He needs further titration of guideline directed medical therapy.  He can no longer be on Actos.  He needs to stop this.  We also discussed switching him to Lipitor and off pravastatin.  He needs high intensity statin.  He is on 81 mg of aspirin.  He is currently on Coreg 12.5 twice a day as well as lisinopril 5 mg daily.  No contraindications to Roy [name].  We discussed stopping Actos and placing him on Jardiance.  He also needs Entresto and Aldactone.  Volume status is acceptable.  He is participating cardiac rehab.  He denies any chest pain or shortness of breath.  Still has some numbness sensation in the chest but this is related to recent sternotomy.  Volume status again is acceptable.  He will need chemotherapy.  I have recommended to move forward this.  They will need to be cautious with fluids as well as certain chemotherapeutic agents.  We will work with him on this.  Overall doing well.  Denies any major symptoms in office.  Problem List 1. 3vCAD -CABG x 4 03/25/2020 (LIMA-LAD, RIMA-PDA, radial artery OM1/2) 2. Systolic HF, ischemic  -[id number]: 25-30% -4//2022: 20-25% 3. DM -A1c 6.2 4. HLD -T chol 140, HDL 36, LDL 74, TG 148 5. Primary testicular large B-cell lymphoma   Past Medical History: Past Medical  History:  Diagnosis Date  . Allergic rhinitis   . Arthritis    wrists  . Cardiomyopathy, nonischemic [facility])    followed by cardiology--- dr [name]---  2016 ef 45-50% ,  2016 nuclear ef 37%,  2017 per echo ef 50-55%  . CHF (congestive heart failure), NYHA class III ([place]) 03/21/2020  . Coronary artery disease   . GERD (gastroesophageal reflux disease)   . Hiatal hernia   . History of kidney stones   . 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 History of syncope (03-06-2020 pt stated has not had sycopal episode in few years, stated it seems to happen in extreme hot conditions)   cardiologist--- dr [name]--- dx recurrent syncope;  nuclear study 11-03-2014 intermediate risk w/ no ischemia, apical hypokinesis, nuclear ef 37%;  event monitor-- 12-21-2015 SB/ ST  no pauses/ arrythmia's;  echo 08-03-2015 ef 50-55%  . Hyperlipidemia   . Hypertension    followed by pcp  . Hypovitaminosis D   . Mass of left testicle   . Nocturia   . Plantar fasciitis   . Presence of surgical incision    01/ 2022  moh's w/ skin graft of nose, per pt still healing and wear bandage daily  . Type 2 diabetes mellitus ([place])  pt is adament that he is not and have been told he is a diabetic but a borderline;  followed by pcp, in pcp note states DM2 and takes 2 meds daily  . Wears glasses   . Wears hearing aid in both ears     Past Surgical History: Past Surgical History:  Procedure Laterality Date  . COLONOSCOPY  last one 01-30-2017  . CORONARY ARTERY BYPASS GRAFT N/A 03/25/2020   Procedure: CORONARY ARTERY BYPASS GRAFTING (CABG), ON PUMP, TIMES FOUR, USING BILATERAL INTERNAL MAMMARY ARTERIES AND LEFT RADIAL ARTERY;  Surgeon: [name], MD;  Location: [facility];  Service: Open Heart Surgery;  Laterality: N/A;  . LOW ANTERIOR RESECTION RECTUM W/ COLOPROCTOSTOMY  05/2003  . MOHS SURGERY  01/2020   nose w/ graft  . ORCHIECTOMY Left  03/11/2020   Procedure: [place];  Surgeon: [name], MD;  Location[facility];  Service: Urology;  Laterality: Left;  ONLY NEEDS 60 MIN  . RADIAL ARTERY HARVEST Left 03/25/2020   Procedure: LEFT RADIAL ARTERY HARVEST;  Surgeon: [name], MD;  Location: [facility];  Service: Open Heart Surgery;  Laterality: Left;  . RIGHT/LEFT HEART CATH AND CORONARY ANGIOGRAPHY N/A 03/23/2020   Procedure: RIGHT/LEFT HEART CATH AND CORONARY ANGIOGRAPHY;  Surgeon: [name], MD;  Location: [facility];  Service: Cardiovascular;  Laterality: N/A;  . SHOULDER SURGERY Right 1992; 07/ 2021  . 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 TEE WITHOUT CARDIOVERSION N/A 03/25/2020   Procedure: TRANSESOPHAGEAL ECHOCARDIOGRAM (TEE);  Surgeon: [name], MD;  Location: [facility];  Service: Open Heart Surgery;  Laterality: N/A;  . TONSILLECTOMY AND ADENOIDECTOMY  child  . UPPER GASTROINTESTINAL ENDOSCOPY  last one 06-06-2017    Current Medications: Current Meds  Medication Sig  . acetaminophen (TYLENOL) 325 MG tablet Take 2 tablets (650 mg total) by mouth every 4 (four) hours as needed for headache or mild pain.  [name] Apoaequorin (PREVAGEN PO) Take 1 tablet by mouth at bedtime.  [name] aspirin EC 81 MG tablet Take 81 mg by mouth daily. Swallow whole.  [name] atorvastatin (LIPITOR) 40 MG tablet Take 1 tablet (40 mg total) by mouth daily.  . cholecalciferol (VITAMIN D) 1000 UNITS tablet Take 1,000 Units by mouth See admin instructions. Mon-friday  . COVID-19 mRNA vaccine, Pfizer, 30 MCG/0.3ML injection USE AS DIRECTED  . empagliflozin (JARDIANCE) 10 MG TABS tablet Take 1 tablet (10 mg total) by mouth daily before breakfast.  . furosemide (LASIX) 80 MG tablet Take 1 tablet (80 mg total) by mouth daily.  [name] glucosamine-chondroitin 500-400 MG tablet Take 1 tablet by mouth. 5 times weekly  . metFORMIN (GLUCOPHAGE) 1000 MG tablet  Take 1,000 mg by mouth 2 (two) times daily with a meal.   . Multiple Vitamin (MULTIVITAMIN WITH MINERALS) TABS tablet Take 1 tablet by mouth. 5 times weekly  . omeprazole (PRILOSEC) 20 MG capsule Take 20 mg by mouth daily.  [name] spironolactone (ALDACTONE) 25 MG tablet Take 0.5 tablets (12.5 mg total) by mouth daily.  . tamsulosin (FLOMAX) 0.4 MG CAPS capsule Take 0.4 mg by mouth at bedtime.  . vitamin B-12 (CYANOCOBALAMIN) 1000 MCG tablet Take 1,000 mcg by mouth. 5 times a week  . [DISCONTINUED] carvedilol (COREG) 6.25 MG tablet Take 2 tablets (12.5 mg total) by mouth in the morning AND 1 tablet (6.25 mg total) every evening.  . [DISCONTINUED] lisinopril (ZESTRIL) 5  MG tablet Take 1 tablet (5 mg total) by mouth daily.  . [DISCONTINUED] pioglitazone (ACTOS) 30 MG tablet Take 30 mg by mouth daily.  . [DISCONTINUED] pravastatin (PRAVACHOL) 80 MG tablet TAKE 1 TABLET (80 MG TOTAL) BY MOUTH DAILY.   Current Facility-Administered Medications for the 05/27/20 encounter (Office Visit) with Geralynn Rile, MD  Medication  . potassium chloride (KLOR-CON) CR tablet 20 mEq     Allergies:    Patient has no known allergies.   Social History: Social History   Socioeconomic History  . Marital status: Married    Spouse name: Not on file  . Number of children: Not on file  . Years of education: Not on file  . Highest education level: Not on file  Occupational History  . Not on file  Tobacco Use  . Smoking status: Former Smoker    Years: 5.00    Types: Pipe    Quit date: 11/29/1976    Years since quitting: 43.5  . Smokeless tobacco: Never Used  Vaping Use  . Vaping Use: Never used  Substance and Sexual Activity  . Alcohol use: Yes    Alcohol/week: 0.0 standard drinks    Comment: Occasional drink   . Drug use: Never  . Sexual activity: Not on file  Other Topics Concern  . Not on file  Social History Narrative  . Not on file   Social Determinants of Health   Financial Resource Strain:  Not on file  Food Insecurity: Not on file  Transportation Needs: Not on file  Physical Activity: Not on file  Stress: Not on file  Social Connections: Not on file     Family History: The patient's family history includes Heart attack in his mother; Stroke in his father. There is no history of Colon cancer, Esophageal cancer, Pancreatic cancer, Rectal cancer, Stomach cancer, or Colon polyps.  ROS:   All other ROS reviewed and negative. Pertinent positives noted in the HPI.     EKGs/Labs/Other Studies Reviewed:   The following studies were personally reviewed by me today:  TTE 03/22/2020 1. Global hypokinesis with akinesis of the inferior and inferolateral  walls; overall severe LV dysfunction.  2. Left ventricular ejection fraction, by estimation, is 25 to 30%. The  left ventricle has severely decreased function. The left ventricle  demonstrates regional wall motion abnormalities (see scoring  diagram/findings for description). The left  ventricular internal cavity size was mildly dilated. Left ventricular  diastolic parameters are consistent with Grade I diastolic dysfunction  (impaired relaxation).  3. Right ventricular systolic function is normal. The right ventricular  size is normal. Tricuspid regurgitation signal is inadequate for assessing  PA pressure.  4. The mitral valve is normal in structure. Mild mitral valve  regurgitation. No evidence of mitral stenosis.  5. The aortic valve is tricuspid. Aortic valve regurgitation is not  visualized. Mild aortic valve sclerosis is present, with no evidence of  aortic valve stenosis.  6. The inferior vena cava is normal in size with greater than 50%  respiratory variability, suggesting right atrial pressure of 3 mmHg.   TTE 05/12/2020 1. Left ventricular ejection fraction, by estimation, is 20 to 25%. Left  ventricular ejection fraction by 2D MOD biplane is 21.8 %. The left  ventricle has severely decreased function. The  left ventricle demonstrates  regional wall motion abnormalities  with global hypokinesis, worse in the inferoseptal, inferior, and  inferolateral distibution. This is confirmed with regional strain mapping.  Left ventricular diastolic parameters are  indeterminate. Elevated left  atrial pressure. The average left  ventricular global longitudinal strain is -10.7 %. The global longitudinal  strain is abnormal.  2. Right ventricular systolic function is mildly reduced. The right  ventricular size is normal. There is mildly elevated pulmonary artery  systolic pressure.  3. Left atrial size was mild to moderately dilated.  4. The mitral valve is normal in structure. Trivial mitral valve  regurgitation.  5. The aortic valve is tricuspid. There is mild calcification of the  aortic valve. There is mild thickening of the aortic valve. Aortic valve  regurgitation is not visualized. Mild aortic valve sclerosis is present,  with no evidence of aortic valve  stenosis.   Recent Labs: 03/21/2020: B Natriuretic Peptide 648.5 03/31/2020: Magnesium 1.6 04/27/2020: ALT 12; BUN 15; Creatinine 1.13; Hemoglobin 10.7; Platelets 333; Potassium 4.1; Sodium 142   Recent Lipid Panel    Component Value Date/Time   CHOL 140 03/21/2020 1147   TRIG 148 03/21/2020 1147   HDL 36 (L) 03/21/2020 1147   CHOLHDL 3.9 03/21/2020 1147   VLDL 30 03/21/2020 1147   LDLCALC 74 03/21/2020 1147    Physical Exam:   VS:  BP 112/62   Pulse 91   Ht 6\' 1"  (1.854 m)   Wt 200 lb 6.4 oz (90.9 kg)   SpO2 97%   BMI 26.44 kg/m    Wt Readings from Last 3 Encounters:  05/27/20 200 lb 6.4 oz (90.9 kg)  05/18/20 205 lb 3.2 oz (93.1 kg)  05/11/20 200 lb (90.7 kg)    General: Well nourished, well developed, in no acute distress Head: Atraumatic, normal size  Eyes: PEERLA, EOMI  Neck: Supple, no JVD Endocrine: No thryomegaly Cardiac: Normal S1, S2; RRR; no murmurs, rubs, or gallops Lungs: Clear to auscultation bilaterally, no  wheezing, rhonchi or rales  Abd: Soft, nontender, no hepatomegaly  Ext: No edema, pulses 2+ Musculoskeletal: No deformities, BUE and BLE strength normal and equal Skin: Warm and dry, no rashes   Neuro: Alert and oriented to person, place, time, and situation, CNII-XII grossly intact, no focal deficits  Psych: Normal mood and affect   ASSESSMENT:   Roy [name] is a 75 y.o. male who presents for the following: 1. Coronary artery disease involving coronary bypass graft of native heart without angina pectoris   2. Chronic systolic heart failure ([place])   3. Mixed hyperlipidemia   4. Essential hypertension     PLAN:   1. Coronary artery disease involving coronary bypass graft of native heart without angina pectoris -Admitted in late February 2022 for non-STEMI and CHF.  Found to have three-vessel CAD.  Status post CABG x4. -Continue aspirin 81 mg daily.  I contemplated Plavix.  He did have a non-STEMI.  However given that he will undergo chemotherapy he will be high bleeding risk.  I am okay to forego Plavix at this time.  Would recommend to continue just aspirin. -Most recent LDL cholesterol 71.  We will stop pravastatin.  Switch to Lipitor 40 mg daily.  He will benefit from high intensity statin therapy. -No symptoms of angina.  2. Chronic systolic heart failure ([facility]) -EF 25%.  This is in the setting of three-vessel CAD.  Status post revascularization.  I suspect his EF will take 3 to 6 months to recover. -We will continue with Coreg 12.5 twice daily. -Volume status is stable.  Continue Lasix 80 mg daily. -Stop Actos.  Glitazone therapy is contraindicated in heart failure. -Stop lisinopril.  We  will plan for Entresto 24-26 mg twice daily.  He will start this on Friday.  This will allow 36 hours of lisinopril washout. -He will start Jardiance 10 mg daily in place of Actos. -I will also start him on Aldactone 12.5 mg daily.  He will start this on Monday.  Would like to start his therapy  slow. -He will come back in 2 weeks for pharmacy titration of Entresto.  He will need a BMP at that time. -He will continue with cardiac rehab. -We will plan to repeat an echocardiogram in 3 to 6 months.  ICD discussion will be had after that. -Regarding his chemotherapy.  He may proceed.  His oncologist already has a plan to treat him while taking into account his congestive heart failure.  3. Mixed hyperlipidemia -Switch to Lipitor 40 mg daily.  4. Essential hypertension -Stable  1. 3vCAD -CABG x 4 03/25/2020 (LIMA-LAD, RIMA-PDA, radial artery OM1/2) 2. Systolic HF, ischemic  -02/2020: 25-30% -4//2022: 20-25% 3. DM -A1c 6.2 4. HLD -T chol 140, HDL 36, LDL 74, TG 148 5. Primary testicular large B-cell lymphoma   Disposition: Return if symptoms worsen or fail to improve.  Medication Adjustments/Labs and Tests Ordered: Current medicines are reviewed at length with the patient today.  Concerns regarding medicines are outlined above.  No orders of the defined types were placed in this encounter.  Meds ordered this encounter  Medications  . carvedilol (COREG) 12.5 MG tablet    Sig: Take 1 tablet (12.5 mg total) by mouth 2 (two) times daily with a meal.    Dispense:  180 tablet    Refill:  1  . atorvastatin (LIPITOR) 40 MG tablet    Sig: Take 1 tablet (40 mg total) by mouth daily.    Dispense:  90 tablet    Refill:  3  . spironolactone (ALDACTONE) 25 MG tablet    Sig: Take 0.5 tablets (12.5 mg total) by mouth daily.    Dispense:  60 tablet    Refill:  1  . empagliflozin (JARDIANCE) 10 MG TABS tablet    Sig: Take 1 tablet (10 mg total) by mouth daily before breakfast.    Dispense:  90 tablet    Refill:  1    Patient Instructions  Medication Instructions:  Stop Actos  Stop Pravastatin Stop Lisinopril  Start Lipitor 40 mg daily Start Entresto 24-26 mg twice daily (use samples until you see PharmD for titration) start FRIDAY Start Aldactone 12.5 mg daily START  MONDAY Start Jardiance 10 mg daily   *If you need a refill on your cardiac medications before your next appointment, please call your pharmacy*  Follow-Up: At [facility], you and your health needs are our priority.  As part of our continuing mission to provide you with exceptional heart care, we have created designated Provider Care Teams.  These Care Teams include your primary Cardiologist (physician) and Advanced Practice Providers (APPs -  Physician Assistants and Nurse Practitioners) who all work together to provide you with the care you need, when you need it.  We recommend signing up for the patient portal called "MyChart".  Sign up information is provided on this After Visit Summary.  MyChart is used to connect with patients for Virtual Visits (Telemedicine).  Patients are able to view lab/test results, encounter notes, upcoming appointments, etc.  Non-urgent messages can be sent to your provider as well.   To learn more about what you can do with MyChart, go to [email or web address].  Your next appointment:   June 1st with Dr.O'Neal  PharmD 2 week appointment needed        Time Spent with Patient: I have spent a total of 35 minutes with patient reviewing [name] notes, telemetry, EKGs, labs and examining the patient as well as establishing an assessment and plan that was discussed with the patient.  > 50% of time was spent in direct patient care.  Signed, Addison Naegeli. [name], MD, [place]  [address], [place], [place] [address] [phone]  05/27/2020 10:35 AM

## 2020-05-27 ENCOUNTER — Other Ambulatory Visit: Payer: Self-pay | Admitting: Adult Health

## 2020-05-27 ENCOUNTER — Ambulatory Visit: Payer: Medicare HMO | Admitting: Cardiovascular Disease

## 2020-05-27 ENCOUNTER — Encounter: Payer: Self-pay | Admitting: Cardiovascular Disease

## 2020-05-27 ENCOUNTER — Encounter: Payer: Self-pay | Admitting: Adult Health

## 2020-05-27 ENCOUNTER — Other Ambulatory Visit: Payer: Self-pay

## 2020-05-27 VITALS — BP 112/62 | HR 91 | Ht 73.0 in | Wt 200.4 lb

## 2020-05-27 DIAGNOSIS — C8330 Diffuse large B-cell lymphoma, unspecified site: Secondary | ICD-10-CM

## 2020-05-27 DIAGNOSIS — I5022 Chronic systolic (congestive) heart failure: Secondary | ICD-10-CM | POA: Diagnosis not present

## 2020-05-27 DIAGNOSIS — I25810 Atherosclerosis of coronary artery bypass graft(s) without angina pectoris: Secondary | ICD-10-CM

## 2020-05-27 DIAGNOSIS — E782 Mixed hyperlipidemia: Secondary | ICD-10-CM

## 2020-05-27 DIAGNOSIS — Z951 Presence of aortocoronary bypass graft: Secondary | ICD-10-CM | POA: Diagnosis not present

## 2020-05-27 DIAGNOSIS — I10 Essential (primary) hypertension: Secondary | ICD-10-CM | POA: Diagnosis not present

## 2020-05-27 MED ORDER — CARVEDILOL 12.5 MG PO TABS: 12.5000 mg | ORAL_TABLET | Freq: Two times a day (BID) | ORAL | 1 refills | Status: DC

## 2020-05-27 MED ORDER — SPIRONOLACTONE 25 MG PO TABS
12.5000 mg | ORAL_TABLET | Freq: Every day | ORAL | 1 refills | Status: DC
Start: 2020-05-27 — End: 2020-12-28

## 2020-05-27 MED ORDER — ATORVASTATIN CALCIUM 40 MG PO TABS: 40.0000 mg | ORAL_TABLET | Freq: Every day | ORAL | 3 refills | Status: DC

## 2020-05-27 MED ORDER — EMPAGLIFLOZIN 10 MG PO TABS: 10.0000 mg | ORAL_TABLET | Freq: Every day | ORAL | 1 refills | Status: DC

## 2020-05-27 NOTE — Progress Notes (Signed)
I connected by phone with Roy [name] on 05/27/2020, 7:34 PM to discuss the potential use of a new treatment, tixagevimab/cilgavimab, for pre-exposure prophylaxis for prevention of coronavirus disease 2019 (COVID-19) caused by the SARS-CoV-2 virus.  This patient is a 75 y.o. male that meets the FDA criteria for Emergency Use Authorization of tixagevimab/cilgavimab for pre-exposure prophylaxis of COVID-19 disease. Pt meets following criteria:  Age >12 yr and weight > 40kg  Not currently infected with SARS-CoV-2 and has no known recent exposure to an individual infected with SARS-CoV-2 AND o Who has moderate to severe immune compromise due to a medical condition or receipt of immunosuppressive medications or treatments and may not mount an adequate immune response to COVID-19 vaccination or  o Vaccination with any available COVID-19 vaccine, according to the approved or authorized schedule, is not recommended due to a history of severe adverse reaction (e.g., severe allergic reaction) to a COVID-19 vaccine(s) and/or COVID-19 vaccine component(s).  o Patient meets the following definition of mod-severe immune compromised status: 1. Received B-cell depleting therapies (e.g. rituximab, obinutuzumab, ocrelizumab, alemtuzumab) within last 6 months & age > or = 10  I have spoken and communicated the following to the patient or parent/caregiver regarding COVID monoclonal antibody treatment:  1. FDA has authorized the emergency use of tixagevimab/cilgavimab for the pre-exposure prophylaxis of COVID-19 in patients with moderate-severe immunocompromised status, who meet above EUA criteria.  2. The significant known and potential risks and benefits of COVID monoclonal antibody, and the extent to which such potential risks and benefits are unknown.  3. Information on available alternative treatments and the risks and benefits of those alternatives, including clinical trials.  4. The patient or parent/caregiver  has the option to accept or refuse COVID monoclonal antibody treatment.  After reviewing this information with the patient, agree to receive tixagevimab/cilgavimab  Scot Dock, NP, 05/27/2020, 7:34 PM

## 2020-05-27 NOTE — Progress Notes (Signed)
Daily Session Note  Patient Details  Name: Roy [name] MRN: [id number] Date of Birth: May 15, 1945 Referring Provider:   Flowsheet Row Cardiac Rehab from 05/04/2020 in [facility] Cardiac [facility]monary Re[facility]  Referring Provider [name] MD      Encounter Date: 05/27/2020  Check In:  Session Check In - 05/27/20 1422      Check-In   Supervising physician immediately available to respond to emergencies See telemetry face sheet for immediately available ER MD    Location [facility]-Cardiac & Pulmonary Rehab    Staff Present Birdie Sons, MPA, RN;Joseph Lou Miner, [place] Exercise Physiologist    Virtual Visit No    Medication changes reported     No    Fall or balance concerns reported    No    Warm-up and Cool-down Performed on first and last piece of equipment    Resistance Training Performed Yes    VAD Patient? No    PAD/SET Patient? No      Pain Assessment   Currently in Pain? No/denies              Social History   Tobacco Use  Smoking Status Former Smoker  . Years: 5.00  . Types: Pipe  . Quit date: 11/29/1976  . Years since quitting: 43.5  Smokeless Tobacco Never Used    Goals Met:  Independence with exercise equipment Exercise tolerated well No report of cardiac concerns or symptoms Strength training completed today  Goals Unmet:  Not Applicable  Comments: Pt able to follow exercise prescription today without complaint.  Will continue to monitor for progression.    Dr. Emily Filbert is Medical Director for [facility].

## 2020-05-27 NOTE — Patient Instructions (Addendum)
Medication Instructions:  Stop Actos  Stop Pravastatin Stop Lisinopril  Start Lipitor 40 mg daily Start Entresto 24-26 mg twice daily (use samples until you see PharmD for titration) start FRIDAY Start Aldactone 12.5 mg daily START MONDAY Start Jardiance 10 mg daily   *If you need a refill on your cardiac medications before your next appointment, please call your pharmacy*  Follow-Up: At [facility], you and your health needs are our priority.  As part of our continuing mission to provide you with exceptional heart care, we have created designated Provider Care Teams.  These Care Teams include your primary Cardiologist (physician) and Advanced Practice Providers (APPs -  Physician Assistants and Nurse Practitioners) who all work together to provide you with the care you need, when you need it.  We recommend signing up for the patient portal called "MyChart".  Sign up information is provided on this After Visit Summary.  MyChart is used to connect with patients for Virtual Visits (Telemedicine).  Patients are able to view lab/test results, encounter notes, upcoming appointments, etc.  Non-urgent messages can be sent to your provider as well.   To learn more about what you can do with MyChart, go to [email or web address].    Your next appointment:   June 1st with Dr.O'Neal  PharmD 2 week appointment needed

## 2020-06-01 ENCOUNTER — Other Ambulatory Visit: Payer: Self-pay

## 2020-06-01 DIAGNOSIS — Z951 Presence of aortocoronary bypass graft: Secondary | ICD-10-CM | POA: Diagnosis not present

## 2020-06-01 NOTE — Progress Notes (Signed)
Daily Session Note  Patient Details  Name: Roy [name] MRN: [id number] Date of Birth: 1945-11-29 Referring Provider:   Flowsheet Row Cardiac Rehab from 05/04/2020 in [facility] Cardiac and Pulmonary Rehab  Referring Provider [name] MD      Encounter Date: 06/01/2020  Check In:  Session Check In - 06/01/20 1403      Check-In   Supervising physician immediately available to respond to emergencies See telemetry face sheet for immediately available ER MD    Location [facility]-Cardiac & Pulmonary Rehab    Staff Present Birdie Sons, MPA, Mauricia Area, BS, ACSM CEP, Exercise Physiologist;Kara Eliezer Bottom, MS Exercise Physiologist    Virtual Visit No    Medication changes reported     Yes    Comments added spironolactone 25mg  daily, jardiance 10mg , lipitor 40mg  daily, and coreg 12.5mg  daily    Fall or balance concerns reported    No    Warm-up and Cool-down Performed on first and last piece of equipment    Resistance Training Performed Yes    VAD Patient? No    PAD/SET Patient? No      Pain Assessment   Currently in Pain? No/denies              Social History   Tobacco Use  Smoking Status Former Smoker  . Years: 5.00  . Types: Pipe  . Quit date: 11/29/1976  . Years since quitting: 43.5  Smokeless Tobacco Never Used    Goals Met:  Independence with exercise equipment Exercise tolerated well No report of cardiac concerns or symptoms Strength training completed today  Goals Unmet:  Not Applicable  Comments: Pt able to follow exercise prescription today without complaint.  Will continue to monitor for progression.    Dr. Emily Filbert is Medical Director for [facility].

## 2020-06-03 ENCOUNTER — Telehealth: Payer: Self-pay | Admitting: Hematology

## 2020-06-03 ENCOUNTER — Other Ambulatory Visit: Payer: Self-pay

## 2020-06-03 DIAGNOSIS — Z951 Presence of aortocoronary bypass graft: Secondary | ICD-10-CM

## 2020-06-03 NOTE — Telephone Encounter (Signed)
Scheduled appt per 5/10 sch msg. Pt's wife is aware.

## 2020-06-03 NOTE — Progress Notes (Signed)
Daily Session Note  Patient Details  Name: ROSAIRE CUETO MRN: [id number] Date of Birth: Aug 12, 1945 Referring Provider:   Flowsheet Row Cardiac Rehab from 05/04/2020 in [facility] Cardiac and Pulmonary Rehab  Referring Provider [name] MD      Encounter Date: 06/03/2020  Check In:  Session Check In - 06/03/20 1417      Check-In   Supervising physician immediately available to respond to emergencies See telemetry face sheet for immediately available ER MD    Location [facility]-Cardiac & Pulmonary Rehab    Staff Present Birdie Sons, MPA, RN;Joseph Lou Miner, [place] Exercise Physiologist    Virtual Visit No    Medication changes reported     No    Fall or balance concerns reported    No    Warm-up and Cool-down Performed on first and last piece of equipment    Resistance Training Performed Yes    VAD Patient? No    PAD/SET Patient? No      Pain Assessment   Currently in Pain? No/denies              Social History   Tobacco Use  Smoking Status Former Smoker  . Years: 5.00  . Types: Pipe  . Quit date: 11/29/1976  . Years since quitting: 43.5  Smokeless Tobacco Never Used    Goals Met:  Independence with exercise equipment Exercise tolerated well No report of cardiac concerns or symptoms Strength training completed today  Goals Unmet:  Not Applicable  Comments: Pt able to follow exercise prescription today without complaint.  Will continue to monitor for progression.    Dr. Emily Filbert is Medical Director for [place] and [facility].

## 2020-06-04 ENCOUNTER — Other Ambulatory Visit: Payer: Self-pay

## 2020-06-04 ENCOUNTER — Inpatient Hospital Stay: Payer: Medicare HMO | Attending: Hematology

## 2020-06-04 DIAGNOSIS — Z298 Encounter for other specified prophylactic measures: Secondary | ICD-10-CM | POA: Diagnosis not present

## 2020-06-04 DIAGNOSIS — Z85820 Personal history of malignant melanoma of skin: Secondary | ICD-10-CM | POA: Diagnosis not present

## 2020-06-04 DIAGNOSIS — Z951 Presence of aortocoronary bypass graft: Secondary | ICD-10-CM | POA: Diagnosis not present

## 2020-06-04 DIAGNOSIS — I252 Old myocardial infarction: Secondary | ICD-10-CM | POA: Insufficient documentation

## 2020-06-04 DIAGNOSIS — C8335 Diffuse large B-cell lymphoma, lymph nodes of inguinal region and lower limb: Secondary | ICD-10-CM | POA: Diagnosis not present

## 2020-06-04 DIAGNOSIS — E785 Hyperlipidemia, unspecified: Secondary | ICD-10-CM | POA: Insufficient documentation

## 2020-06-04 DIAGNOSIS — Z87891 Personal history of nicotine dependence: Secondary | ICD-10-CM | POA: Diagnosis not present

## 2020-06-04 DIAGNOSIS — I255 Ischemic cardiomyopathy: Secondary | ICD-10-CM | POA: Diagnosis not present

## 2020-06-04 DIAGNOSIS — Z9079 Acquired absence of other genital organ(s): Secondary | ICD-10-CM | POA: Diagnosis not present

## 2020-06-04 DIAGNOSIS — I10 Essential (primary) hypertension: Secondary | ICD-10-CM | POA: Diagnosis not present

## 2020-06-04 DIAGNOSIS — E119 Type 2 diabetes mellitus without complications: Secondary | ICD-10-CM | POA: Diagnosis not present

## 2020-06-04 DIAGNOSIS — I509 Heart failure, unspecified: Secondary | ICD-10-CM | POA: Diagnosis not present

## 2020-06-04 DIAGNOSIS — C8330 Diffuse large B-cell lymphoma, unspecified site: Secondary | ICD-10-CM

## 2020-06-04 MED ORDER — TIXAGEVIMAB (PART OF EVUSHELD) INJECTION
300.0000 mg | Freq: Once | INTRAMUSCULAR | Status: AC
  Administered 2020-06-04: 300 mg via INTRAMUSCULAR
  Filled 2020-06-04: qty 3

## 2020-06-04 MED ORDER — CILGAVIMAB (PART OF EVUSHELD) INJECTION
300.0000 mg | Freq: Once | INTRAMUSCULAR | Status: AC
  Administered 2020-06-04: 300 mg via INTRAMUSCULAR
  Filled 2020-06-04: qty 3

## 2020-06-08 ENCOUNTER — Other Ambulatory Visit: Payer: Self-pay

## 2020-06-08 DIAGNOSIS — Z951 Presence of aortocoronary bypass graft: Secondary | ICD-10-CM | POA: Diagnosis not present

## 2020-06-08 NOTE — Progress Notes (Signed)
Daily Session Note  Patient Details  Name: Roy [name] MRN: [id number] Date of Birth: January 16, 1946 Referring Provider:   Flowsheet Row Cardiac Rehab from 05/04/2020 in [facility] Cardiac and Pulmonary Rehab  Referring Provider [name] MD      Encounter Date: 06/08/2020  Check In:  Session Check In - 06/08/20 1414      Check-In   Supervising physician immediately available to respond to emergencies See telemetry face sheet for immediately available ER MD    Location [facility]-Cardiac & Pulmonary Rehab    Staff Present Birdie Sons, MPA, Mauricia Area, BS, ACSM CEP, Exercise Physiologist;Laureen Owens Shark, BS, RRT, CPFT;Jessica Ridgefield, MA, RCEP, CCRP, CCET    Virtual Visit No    Medication changes reported     No    Fall or balance concerns reported    No    Warm-up and Cool-down Performed on first and last piece of equipment    Resistance Training Performed Yes    VAD Patient? No    PAD/SET Patient? No      Pain Assessment   Currently in Pain? No/denies              Social History   Tobacco Use  Smoking Status Former Smoker  . Years: 5.00  . Types: Pipe  . Quit date: 11/29/1976  . Years since quitting: 43.5  Smokeless Tobacco Never Used    Goals Met:  Independence with exercise equipment Exercise tolerated well No report of cardiac concerns or symptoms Strength training completed today  Goals Unmet:  Not Applicable  Comments: Pt able to follow exercise prescription today without complaint.  Will continue to monitor for progression.  Reviewed home exercise with pt today.  Pt plans to walk and use staff videos at home for exercise.  Reviewed THR, pulse, RPE, sign and symptoms, pulse oximetery and when to call 911 or MD.  Also discussed weather considerations and indoor options.  Pt voiced understanding.   Dr. Emily Filbert is Medical Director for [place] and [facility].

## 2020-06-10 ENCOUNTER — Other Ambulatory Visit: Payer: Self-pay

## 2020-06-10 ENCOUNTER — Encounter: Payer: Self-pay | Admitting: *Deleted

## 2020-06-10 DIAGNOSIS — Z951 Presence of aortocoronary bypass graft: Secondary | ICD-10-CM

## 2020-06-10 NOTE — Progress Notes (Signed)
Daily Session Note  Patient Details  Name: Roy [name] MRN: [id number] Date of Birth: 1946/01/20 Referring Provider:   Flowsheet Row Cardiac Rehab from 05/04/2020 in [facility] Cardiac and Pulmonary Rehab  Referring Provider [name] MD      Encounter Date: 06/10/2020  Check In:  Session Check In - 06/10/20 1404      Check-In   Supervising physician immediately available to respond to emergencies See telemetry face sheet for immediately available ER MD    Location [facility]-Cardiac & Pulmonary Rehab    Staff Present Birdie Sons, MPA, RN;Meredith Sherryll Burger, RN Margurite Auerbach, MS Exercise Physiologist    Virtual Visit No    Medication changes reported     No    Fall or balance concerns reported    No    Warm-up and Cool-down Performed on first and last piece of equipment    Resistance Training Performed Yes    VAD Patient? No    PAD/SET Patient? No      Pain Assessment   Currently in Pain? No/denies              Social History   Tobacco Use  Smoking Status Former Smoker  . Years: 5.00  . Types: Pipe  . Quit date: 11/29/1976  . Years since quitting: 43.5  Smokeless Tobacco Never Used    Goals Met:  Independence with exercise equipment Exercise tolerated well No report of cardiac concerns or symptoms Strength training completed today  Goals Unmet:  Not Applicable  Comments: Pt able to follow exercise prescription today without complaint.  Will continue to monitor for progression.    Dr. Emily Filbert is Medical Director for [place] and [facility].

## 2020-06-10 NOTE — Progress Notes (Signed)
Cardiac Individual Treatment Plan  Patient Details  Name: Roy [name] MRN: [id number] Date of Birth: 01-14-1946 Referring Provider:   Flowsheet Row Cardiac Rehab from 05/04/2020 in [facility] Cardiac and Pulmonary Rehab  Referring Provider [name] MD      Initial Encounter Date:  Flowsheet Row Cardiac Rehab from 05/04/2020 in [facility] Cardiac and Pulmonary Rehab  Date 05/04/20      Visit Diagnosis: S/P CABG x 4  Patient's Home Medications on Admission:  Current Outpatient Medications:  .  acetaminophen (TYLENOL) 325 MG tablet, Take 2 tablets (650 mg total) by mouth every 4 (four) hours as needed for headache or mild pain., Disp: , Rfl:  .  Apoaequorin (PREVAGEN PO), Take 1 tablet by mouth at bedtime., Disp: , Rfl:  .  aspirin EC 81 MG tablet, Take 81 mg by mouth daily. Swallow whole., Disp: , Rfl:  .  atorvastatin (LIPITOR) 40 MG tablet, Take 1 tablet (40 mg total) by mouth daily., Disp: 90 tablet, Rfl: 3 .  carvedilol (COREG) 12.5 MG tablet, Take 1 tablet (12.5 mg total) by mouth 2 (two) times daily with a meal., Disp: 180 tablet, Rfl: 1 .  cholecalciferol (VITAMIN D) 1000 UNITS tablet, Take 1,000 Units by mouth See admin instructions. Mon-friday, Disp: , Rfl:  .  COVID-19 mRNA vaccine, Pfizer, 30 MCG/0.3ML injection, USE AS DIRECTED, Disp: .3 mL, Rfl: 0 .  empagliflozin (JARDIANCE) 10 MG TABS tablet, Take 1 tablet (10 mg total) by mouth daily before breakfast., Disp: 90 tablet, Rfl: 1 .  furosemide (LASIX) 80 MG tablet, Take 1 tablet (80 mg total) by mouth daily., Disp: 30 tablet, Rfl: 11 .  glucosamine-chondroitin 500-400 MG tablet, Take 1 tablet by mouth. 5 times weekly, Disp: , Rfl:  .  metFORMIN (GLUCOPHAGE) 1000 MG tablet, Take 1,000 mg by mouth 2 (two) times daily with a meal. , Disp: , Rfl:  .  Multiple Vitamin (MULTIVITAMIN WITH MINERALS) TABS tablet, Take 1 tablet by mouth. 5 times weekly, Disp: , Rfl:  .  omeprazole (PRILOSEC) 20 MG capsule, Take 20 mg by mouth daily., Disp:  , Rfl:  .  sacubitril-valsartan (ENTRESTO) 24-26 MG, Take 1 tablet by mouth 2 (two) times daily., Disp: , Rfl:  .  spironolactone (ALDACTONE) 25 MG tablet, Take 0.5 tablets (12.5 mg total) by mouth daily., Disp: 60 tablet, Rfl: 1 .  tamsulosin (FLOMAX) 0.4 MG CAPS capsule, Take 0.4 mg by mouth at bedtime., Disp: , Rfl:  .  vitamin B-12 (CYANOCOBALAMIN) 1000 MCG tablet, Take 1,000 mcg by mouth. 5 times a week, Disp: , Rfl:   Current Facility-Administered Medications:  .  potassium chloride (KLOR-CON) CR tablet 20 mEq, 20 mEq, Oral, BID, Atkins, Glenice Bow, MD  Past Medical History: Past Medical History:  Diagnosis Date  . Allergic rhinitis   . Arthritis    wrists  . Cardiomyopathy, nonischemic [facility])    followed by cardiology--- dr [name]---  2016 ef 45-50% ,  2016 nuclear ef 37%,  2017 per echo ef 50-55%  . CHF (congestive heart failure), NYHA class III ([place]) 03/21/2020  . Coronary artery disease   . GERD (gastroesophageal reflux disease)   . Hiatal hernia   . History of kidney stones   . 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 History of syncope (03-06-2020 pt stated has not had sycopal episode in few years, stated it seems to happen  in extreme hot conditions)   cardiologist--- dr [name]--- dx recurrent syncope;  nuclear study 11-03-2014 intermediate risk w/ no ischemia, apical hypokinesis, nuclear ef 37%;  event monitor-- 12-21-2015 SB/ ST  no pauses/ arrythmia's;  echo 08-03-2015 ef 50-55%  . Hyperlipidemia   . Hypertension    followed by pcp  . Hypovitaminosis D   . Mass of left testicle   . Nocturia   . Plantar fasciitis   . Presence of surgical incision    01/ 2022  moh's w/ skin graft of nose, per pt still healing and wear bandage daily  . Type 2 diabetes mellitus ([address])    pt is adament that he is not and have been told he is a diabetic but a borderline;  followed by pcp, in pcp note states  DM2 and takes 2 meds daily  . Wears glasses   . Wears hearing aid in both ears     Tobacco Use: Social History   Tobacco Use  Smoking Status Former Smoker  . Years: 5.00  . Types: Pipe  . Quit date: 11/29/1976  . Years since quitting: 43.5  Smokeless Tobacco Never Used    Labs: Recent Chemical engineer    Labs for ITP Cardiac and Pulmonary Rehab Latest Ref Rng & Units 03/27/2020 03/28/2020 03/29/2020 03/30/2020 03/30/2020   Cholestrol 0 - 200 mg/dL - - - - -   LDLCALC 0 - 99 mg/dL - - - - -   HDL >40 mg/dL - - - - -   Trlycerides <150 mg/dL - - - - -   Hemoglobin A1c 4.8 - 5.6 % - - - - -   PHART 7.350 - 7.450 - - - - -   PCO2ART 32.0 - 48.0 mmHg - - - - -   HCO3 20.0 - 28.0 mmol/L - - - - -   TCO2 22 - 32 mmol/L - - - - -   ACIDBASEDEF 0.0 - 2.0 mmol/L - - - - -   O2SAT % 64.7 61.4 60.3 59.8 56.5       Exercise Target Goals: Exercise Program Goal: Individual exercise prescription set using results from initial 6 min walk test and THRR while considering  patient's activity barriers and safety.   Exercise Prescription Goal: Initial exercise prescription builds to 30-45 minutes a day of aerobic activity, 2-3 days per week.  Home exercise guidelines will be given to patient during program as part of exercise prescription that the participant will acknowledge.   Education: Aerobic Exercise: - Group verbal and visual presentation on the components of exercise prescription. Introduces F.I.T.T principle from ACSM for exercise prescriptions.  Reviews F.I.T.T. principles of aerobic exercise including progression. Written material given at graduation.   Education: Resistance Exercise: - Group verbal and visual presentation on the components of exercise prescription. Introduces F.I.T.T principle from ACSM for exercise prescriptions  Reviews F.I.T.T. principles of resistance exercise including progression. Written material given at graduation.    Education: Exercise & Equipment  Safety: - Individual verbal instruction and demonstration of equipment use and safety with use of the equipment. Flowsheet Row Cardiac Rehab from 06/03/2020 in [facility] Cardiac and Pulmonary Rehab  Date 04/30/20  Educator [facility]  Instruction Review Code 1- Verbalizes Understanding      Education: Exercise Physiology & General Exercise Guidelines: - Group verbal and written instruction with models to review the exercise physiology of the cardiovascular system and associated critical values. Provides general exercise guidelines with specific guidelines to those with heart  or lung disease.    Education: Flexibility, Balance, Mind/Body Relaxation: - Group verbal and visual presentation with interactive activity on the components of exercise prescription. Introduces F.I.T.T principle from ACSM for exercise prescriptions. Reviews F.I.T.T. principles of flexibility and balance exercise training including progression. Also discusses the mind body connection.  Reviews various relaxation techniques to help reduce and manage stress (i.e. Deep breathing, progressive muscle relaxation, and visualization). Balance handout provided to take home. Written material given at graduation. Flowsheet Row Cardiac Rehab from 06/03/2020 in [facility] Cardiac and Pulmonary Rehab  Date 05/27/20  Educator [facility]  Instruction Review Code 1- Verbalizes Understanding      Activity Barriers & Risk Stratification:  Activity Barriers & Cardiac Risk Stratification - 05/04/20 1237      Activity Barriers & Cardiac Risk Stratification   Activity Barriers Shortness of Breath;Decreased Ventricular Function;Deconditioning;Muscular Weakness;Other (comment)    Comments Right shoulder- limited ROM due to rotator cuff surgery and bicep tear    Cardiac Risk Stratification High           6 Minute Walk:  6 Minute Walk    Row Name 05/04/20 1245         6 Minute Walk   Phase Initial     Distance 1000 feet     Walk Time 6 minutes     # of Rest  Breaks 0     MPH 1.89     METS 2.19     RPE 11     Perceived Dyspnea  1     VO2 Peak 7.69     Symptoms No     Resting HR 98 bpm     Resting BP 112/68     Resting Oxygen Saturation  99 %     Exercise Oxygen Saturation  during 6 min walk 99 %     Max Ex. HR 116 bpm     Max Ex. BP 122/66     2 Minute Post BP 110/68            Oxygen Initial Assessment:   Oxygen Re-Evaluation:   Oxygen Discharge (Final Oxygen Re-Evaluation):   Initial Exercise Prescription:  Initial Exercise Prescription - 05/04/20 1200      Date of Initial Exercise RX and Referring Provider   Date 05/04/20    Referring Provider [name] MD      Treadmill   MPH 1.7    Grade 0.5    Minutes 15    METs 2.42      Recumbant Bike   Level 2    RPM 60    Watts 10    Minutes 15    METs 2.1      NuStep   Level 2    SPM 80    Minutes 15    METs 2.1      T5 Nustep   Level 1    SPM 80    Minutes 15    METs 2.1      Prescription Details   Frequency (times per week) 2    Duration Progress to 30 minutes of continuous aerobic without signs/symptoms of physical distress      Intensity   THRR 40-80% of Max Heartrate 116-134    Ratings of Perceived Exertion 11-13    Perceived Dyspnea 0-4      Progression   Progression Continue to progress workloads to maintain intensity without signs/symptoms of physical distress.      Resistance Training   Training Prescription Yes  Weight 3 lb    Reps 10-15           Perform Capillary Blood Glucose checks as needed.  Exercise Prescription Changes:  Exercise Prescription Changes    Row Name 05/04/20 1200 05/20/20 1500 06/02/20 1300 06/08/20 1400       Response to Exercise   Blood Pressure (Admit) 112/68 116/64 104/64 --    Blood Pressure (Exercise) 122/66 124/60 142/70 --    Blood Pressure (Exit) 110/68 104/62 90/62 --    Heart Rate (Admit) 98 bpm 74 bpm 100 bpm --    Heart Rate (Exercise) 116 bpm 111 bpm 112 bpm --    Heart Rate (Exit)  99 bpm 100 bpm 104 bpm --    Oxygen Saturation (Admit) 99 % -- -- --    Oxygen Saturation (Exercise) 99 % -- -- --    Oxygen Saturation (Exit) 99 % -- -- --    Rating of Perceived Exertion (Exercise) _0 --    Perceived Dyspnea (Exercise) 1 -- -- --    Symptoms none none -- --    Comments walk test results third full day of exercise -- --    Duration -- Continue with 30 min of aerobic exercise without signs/symptoms of physical distress. Continue with 30 min of aerobic exercise without signs/symptoms of physical distress. --    Intensity -- THRR unchanged THRR unchanged --         Progression   Progression -- Continue to progress workloads to maintain intensity without signs/symptoms of physical distress. Continue to progress workloads to maintain intensity without signs/symptoms of physical distress. --    Average METs -- 1.93 2.16 --         Resistance Training   Training Prescription -- Yes Yes --    Weight -- 3 lb 3 lb --    Reps -- 10-15 10-15 --         Interval Training   Interval Training -- No No --         Treadmill   MPH -- 0.7 1.1 --    Grade -- 0 0.5 --    Minutes -- 15 15 --    METs -- 1.5 1.92 --         Recumbant Bike   Level -- 2 1 --    Minutes -- 15 15 --    METs -- 2 2.3 --         NuStep   Level -- 2 -- --    Minutes -- 15 -- --    METs -- 2.2 -- --         T5 Nustep   Level -- 1 -- --    Minutes -- 15 -- --    METs -- 2 -- --         Home Exercise Plan   Plans to continue exercise at -- -- -- Home (comment)  walking, weights, staff videos    Frequency -- -- -- Add 2 additional days to program exercise sessions.    Initial Home Exercises Provided -- -- -- 06/08/20           Exercise Comments:   Exercise Goals and Review:  Exercise Goals    Row Name 05/04/20 1304             Exercise Goals   Increase Physical Activity Yes       Intervention Provide advice, education, support and counseling about physical  activity/exercise needs.;Develop  an individualized exercise prescription for aerobic and resistive training based on initial evaluation findings, risk stratification, comorbidities and participant's personal goals.       Expected Outcomes Short Term: Attend rehab on a regular basis to increase amount of physical activity.;Long Term: Exercising regularly at least 3-5 days a week.;Long Term: Add in home exercise to make exercise part of routine and to increase amount of physical activity.       Increase Strength and Stamina Yes       Intervention Provide advice, education, support and counseling about physical activity/exercise needs.;Develop an individualized exercise prescription for aerobic and resistive training based on initial evaluation findings, risk stratification, comorbidities and participant's personal goals.       Expected Outcomes Short Term: Increase workloads from initial exercise prescription for resistance, speed, and METs.;Short Term: Perform resistance training exercises routinely during rehab and add in resistance training at home;Long Term: Improve cardiorespiratory fitness, muscular endurance and strength as measured by increased METs and functional capacity (6MWT)       Able to understand and use rate of perceived exertion (RPE) scale Yes       Intervention Provide education and explanation on how to use RPE scale       Expected Outcomes Short Term: Able to use RPE daily in rehab to express subjective intensity level;Long Term:  Able to use RPE to guide intensity level when exercising independently       Able to understand and use Dyspnea scale Yes       Intervention Provide education and explanation on how to use Dyspnea scale       Expected Outcomes Short Term: Able to use Dyspnea scale daily in rehab to express subjective sense of shortness of breath during exertion;Long Term: Able to use Dyspnea scale to guide intensity level when exercising independently       Knowledge and  understanding of Target Heart Rate Range (THRR) Yes       Intervention Provide education and explanation of THRR including how the numbers were predicted and where they are located for reference       Expected Outcomes Short Term: Able to state/look up THRR;Short Term: Able to use daily as guideline for intensity in rehab;Long Term: Able to use THRR to govern intensity when exercising independently       Able to check pulse independently Yes       Intervention Provide education and demonstration on how to check pulse in carotid and radial arteries.;Review the importance of being able to check your own pulse for safety during independent exercise       Expected Outcomes Short Term: Able to explain why pulse checking is important during independent exercise;Long Term: Able to check pulse independently and accurately       Understanding of Exercise Prescription Yes       Intervention Provide education, explanation, and written materials on patient's individual exercise prescription       Expected Outcomes Short Term: Able to explain program exercise prescription;Long Term: Able to explain home exercise prescription to exercise independently              Exercise Goals Re-Evaluation :  Exercise Goals Re-Evaluation    Row Name 05/14/20 1407 05/20/20 1553 06/02/20 1332 06/08/20 1406       Exercise Goal Re-Evaluation   Exercise Goals Review Increase Physical Activity;Able to understand and use rate of perceived exertion (RPE) scale;Knowledge and understanding of Target Heart Rate Range (THRR);Understanding of Exercise Prescription;Increase Strength and  Stamina;Able to check pulse independently Increase Physical Activity;Increase Strength and Stamina;Understanding of Exercise Prescription Increase Physical Activity;Increase Strength and Stamina Increase Physical Activity;Increase Strength and Stamina;Understanding of Exercise Prescription    Comments Reviewed RPE and dyspnea scales, THR and program  prescription with pt today.  Pt voiced understanding and was given a copy of goals to take home. Gotay is off to a good start in rehab.  He has completed his first three full days of exercise.  We will continue to monitor his progress. Mabry has increased to 1.1 mph on TM.  He is close to target HR range.  Staff will monitor progress. Remer is doing well in rehab.  He is feeling like his strength and stamina are recoving.  He does not get as tired as quickly.  I he was able to take trash out last week without stopping!!  Reviewed home exercise with pt today.  Pt plans to walk and use staff videos at home for exercise.  Reviewed THR, pulse, RPE, sign and symptoms, pulse oximetery and when to call 911 or MD.  Also discussed weather considerations and indoor options.  Pt voiced understanding.    Expected Outcomes Short: Use RPE daily to regulate intensity. Long: Follow program prescription in THR. Short: Continue to attend rehab regularly Long: Continue to follow program prescription Short: work in [employer] range Long: contineu to build stamina Short: start to add in more walk and staff videos at home Long: Continue to build stamina           Discharge Exercise Prescription (Final Exercise Prescription Changes):  Exercise Prescription Changes - 06/08/20 1400      Home Exercise Plan   Plans to continue exercise at Home (comment)   walking, weights, staff videos   Frequency Add 2 additional days to program exercise sessions.    Initial Home Exercises Provided 06/08/20           Nutrition:  Target Goals: Understanding of nutrition guidelines, daily intake of sodium <1557m, cholesterol <2022m calories 30% from fat and 7% or less from saturated fats, daily to have 5 or more servings of fruits and vegetables.  Education: All About Nutrition: -Group instruction provided by verbal, written material, interactive activities, discussions, models, and posters to present general guidelines for heart healthy  nutrition including fat, fiber, MyPlate, the role of sodium in heart healthy nutrition, utilization of the nutrition label, and utilization of this knowledge for meal planning. Follow up email sent as well. Written material given at graduation. [facility]  Date 06/03/20  [facility] Review Code 1- Verbalizes Understanding      Biometrics:  Pre Biometrics - 05/04/20 1236      Pre Biometrics   Height 6' (1.829 m)    Weight 203 lb 8 oz (92.3 kg)    BMI (Calculated) 27.59    Single Leg Stand 15.9 seconds            Nutrition Therapy Plan and Nutrition Goals:  Nutrition Therapy & Goals - 05/19/20 1002      Nutrition Therapy   Diet Heart healthy, low Na, Diabetes friendly    Protein (specify units) 95-110g (active cancer - not in treatment yet)    Fiber 30 grams    Whole Grain Foods 3 servings    Saturated Fats 12 max. grams    Fruits and Vegetables 8 servings/day    Sodium 1.5 grams  Personal Nutrition Goals   Nutrition Goal ST: Increase fruit/vegetable intake to 5 servings per day (eat a rainbow per week), limit processed meat to <2x/month, mix brown rice with white rice to see how he enjoys it. LT: meet nutritional needs going through treatment, make sure RD is part of care team    Comments He has active Cancer, but needs to talk with cardiologist regarding his heart status and cancer treatment. The oncologists say he needs to begin treatment soon. Encouraged to ensure RD is a part of his treatment team. He is honoring his hunger cues and eating until satisfied, but not uncomfortably full. He is no longer eating red meat every day. He eats mostly chicken, fish, shrimp, and pork chops. He likes to eat many green vegetables which he will steam and he enjoys fruit - 3 servings per day normally (2 fruits and a vegetable; canned fruit typically -in own juice). He uses mostly olive oil, if eating eggs will  use bacon fat - eats processed meat like bacon 2x/month. Does not eat much bread, but he enjoys potatoes. A1C: 6.7. He does not take his BG at home and does not have a monitor at home. B: fruit cocktail or oatmeal or griits with dried fruit or a pad of butter; this morning he had an english muffin (regular) with strawberry jelly, butter, and a salmon patty with coffee (2 splenda and half and half) and apple juice for his medications. S:2 graham cracker with PB and 2% milk or white bread and [place] sandwich D: pork chop with mac and cheese and fried apples and iced tea (sweet) tonight. He will occassionally have shot of liquor or wine. Drinks: water and occassionally pepsi, coke, dr pepper, 7-up. He reports liking whole wheat bread, but does not like it every day. Discussed heart healthy eating, diabetes friendly eating, and eating during cancer treatment (discussed possible barriers and increased needs).      Intervention Plan   Intervention Prescribe, educate and counsel regarding individualized specific dietary modifications aiming towards targeted core components such as weight, hypertension, lipid management, diabetes, heart failure and other comorbidities.;Nutrition handout(s) given to patient.    Expected Outcomes Short Term Goal: Understand basic principles of dietary content, such as calories, fat, sodium, cholesterol and nutrients.;Short Term Goal: A plan has been developed with personal nutrition goals set during dietitian appointment.;Long Term Goal: Adherence to prescribed nutrition plan.           Nutrition Assessments:  MEDIFICTS Score Key:  ?70 Need to make dietary changes   40-70 Heart Healthy Diet  ? 40 Therapeutic Level Cholesterol Diet  Flowsheet Row Cardiac Rehab from 05/04/2020 in [facility] Cardiac and Pulmonary Rehab  Picture Your Plate Total Score on Admission 62     Picture Your Plate Scores:  <08 Unhealthy dietary pattern with much room for improvement.  41-50 Dietary  pattern unlikely to meet recommendations for good health and room for improvement.  51-60 More healthful dietary pattern, with some room for improvement.   >60 Healthy dietary pattern, although there may be some specific behaviors that could be improved.    Nutrition Goals Re-Evaluation:  Nutrition Goals Re-Evaluation    [place] Name 06/08/20 1409             Goals   Nutrition Goal ST: Increase fruit/vegetable intake to 5 servings per day (eat a rainbow per week), limit processed meat to <2x/month, mix brown rice with white rice to see how he enjoys it. LT: meet nutritional  needs going through treatment, make sure RD is part of care team       Comment He met with Lenna Sciara recently and has already backed off his portion sizes.  He is eating more fruit than ever before.  He is trying to get a good variety.  He is sticking with 2-21/2 meals a day.  Usually breakfast, snack, and dinner.       Expected Outcome Short: Continue twith changes Long: COnitnue to focus on healthy eating.              Nutrition Goals Discharge (Final Nutrition Goals Re-Evaluation):  Nutrition Goals Re-Evaluation - 06/08/20 1409      Goals   Nutrition Goal ST: Increase fruit/vegetable intake to 5 servings per day (eat a rainbow per week), limit processed meat to <2x/month, mix brown rice with white rice to see how he enjoys it. LT: meet nutritional needs going through treatment, make sure RD is part of care team    Comment He met with Lenna Sciara recently and has already backed off his portion sizes.  He is eating more fruit than ever before.  He is trying to get a good variety.  He is sticking with 2-21/2 meals a day.  Usually breakfast, snack, and dinner.    Expected Outcome Short: Continue twith changes Long: COnitnue to focus on healthy eating.           Psychosocial: Target Goals: Acknowledge presence or absence of significant depression and/or stress, maximize coping skills, provide positive support system.  Participant is able to verbalize types and ability to use techniques and skills needed for reducing stress and depression.   Education: Stress, Anxiety, and Depression - Group verbal and visual presentation to define topics covered.  Reviews how body is impacted by stress, anxiety, and depression.  Also discusses healthy ways to reduce stress and to treat/manage anxiety and depression.  Written material given at graduation.   Education: Sleep Hygiene -Provides group verbal and written instruction about how sleep can affect your health.  Define sleep hygiene, discuss sleep cycles and impact of sleep habits. Review good sleep hygiene tips.    Initial Review & Psychosocial Screening:  Initial Psych Review & Screening - 04/30/20 0913      Initial Review   Current issues with None Identified      Family Dynamics   Good Support System? Yes    Comments He can look to his wife  for support. [place] has a positive outlook on his health.      Barriers   Psychosocial barriers to participate in program There are no identifiable barriers or psychosocial needs.;The patient should benefit from training in stress management and relaxation.      Screening Interventions   Interventions To provide support and resources with identified psychosocial needs;Provide feedback about the scores to participant;Encouraged to exercise    Expected Outcomes Short Term goal: Utilizing psychosocial counselor, staff and physician to assist with identification of specific Stressors or current issues interfering with healing process. Setting desired goal for each stressor or current issue identified.;Long Term Goal: Stressors or current issues are controlled or eliminated.;Short Term goal: Identification and review with participant of any Quality of Life or Depression concerns found by scoring the questionnaire.;Long Term goal: The participant improves quality of Life and PHQ9 Scores as seen by post scores and/or verbalization of  changes           Quality of Life Scores:   Quality of Life - 05/04/20 1234  Quality of Life   Select Quality of Life      Quality of Life Scores   Health/Function Pre 20.07 %    Socioeconomic Pre 25.57 %    Psych/Spiritual Pre 30 %    Family Pre 27 %    GLOBAL Pre 24.31 %          Scores of 19 and below usually indicate a poorer quality of life in these areas.  A difference of  2-3 points is a clinically meaningful difference.  A difference of 2-3 points in the total score of the Quality of Life Index has been associated with significant improvement in overall quality of life, self-image, physical symptoms, and general health in studies assessing change in quality of life.  PHQ-9: Recent Review Flowsheet Data    Depression screen [facility] 2/9 05/04/2020   Decreased Interest 0   Down, Depressed, Hopeless 0   PHQ - 2 Score 0   Altered sleeping 2   Tired, decreased energy 1   Change in appetite 1   Feeling bad or failure about yourself  0   Trouble concentrating 0   Moving slowly or fidgety/restless 0   Suicidal thoughts 0   PHQ-9 Score 4   Difficult doing work/chores Not difficult at all     Interpretation of Total Score  Total Score Depression Severity:  1-4 = Minimal depression, 5-9 = Mild depression, 10-14 = Moderate depression, 15-19 = Moderately severe depression, 20-27 = Severe depression   Psychosocial Evaluation and Intervention:  Psychosocial Evaluation - 04/30/20 0914      Psychosocial Evaluation & Interventions   Interventions Encouraged to exercise with the program and follow exercise prescription;Stress management education;Relaxation education    Comments He can look to his wife for support. [place] has a positive outlook on his health.    Expected Outcomes Short: Exercise regularly to support mental health and notify staff of any changes. Long: maintain mental health and well being through teaching of rehab or prescribed medications independently.     Continue Psychosocial Services  Follow up required by staff           Psychosocial Re-Evaluation:  Psychosocial Re-Evaluation    [place] Name 06/08/20 1407             Psychosocial Re-Evaluation   Current issues with Current Stress Concerns       Comments Last night was a rough sleep night.  However, he usually gets 7-9 hours.  Today, he feeling off his game and out of sorts with blurry vision upon standing. Mentally he is doing well.  Other than his health he has no major stressors.  They went to a wedding over the weekend.  He is generally a positive and happy person.       Expected Outcomes Short: Continue to keep close eye on health Long: Conitnue to stay positive.       Interventions Encouraged to attend Cardiac Rehabilitation for the exercise       Continue Psychosocial Services  Follow up required by staff              Psychosocial Discharge (Final Psychosocial Re-Evaluation):  Psychosocial Re-Evaluation - 06/08/20 1407      Psychosocial Re-Evaluation   Current issues with Current Stress Concerns    Comments Last night was a rough sleep night.  However, he usually gets 7-9 hours.  Today, he feeling off his game and out of sorts with blurry vision upon standing. Mentally he is doing well.  Other than his health he has no major stressors.  They went to a wedding over the weekend.  He is generally a positive and happy person.    Expected Outcomes Short: Continue to keep close eye on health Long: Conitnue to stay positive.    Interventions Encouraged to attend Cardiac Rehabilitation for the exercise    Continue Psychosocial Services  Follow up required by staff           Vocational Rehabilitation: Provide vocational rehab assistance to qualifying candidates.   Vocational Rehab Evaluation & Intervention:   Education: Education Goals: Education classes will be provided on a variety of topics geared toward better understanding of heart health and risk factor modification.  Participant will state understanding/return demonstration of topics presented as noted by education test scores.  Learning Barriers/Preferences:  Learning Barriers/Preferences - 04/30/20 0910      Learning Barriers/Preferences   Learning Barriers None    Learning Preferences None           General Cardiac Education Topics:  AED/CPR: - Group verbal and written instruction with the use of models to demonstrate the basic use of the AED with the basic ABC's of resuscitation.   Anatomy and Cardiac Procedures: - Group verbal and visual presentation and models provide information about basic cardiac anatomy and function. Reviews the testing methods done to diagnose heart disease and the outcomes of the test results. Describes the treatment choices: Medical Management, Angioplasty, or Coronary Bypass Surgery for treating various heart conditions including Myocardial Infarction, Angina, Valve Disease, and Cardiac Arrhythmias.  Written material given at graduation.   Medication Safety: - Group verbal and visual instruction to review commonly prescribed medications for heart and lung disease. Reviews the medication, class of the drug, and side effects. Includes the steps to properly store meds and maintain the prescription regimen.  Written material given at graduation.   Intimacy: - Group verbal instruction through game format to discuss how heart and lung disease can affect sexual intimacy. Written material given at graduation..   Know Your Numbers and Heart Failure: - Group verbal and visual instruction to discuss disease risk factors for cardiac and pulmonary disease and treatment options.  Reviews associated critical values for Overweight/Obesity, Hypertension, Cholesterol, and Diabetes.  Discusses basics of heart failure: signs/symptoms and treatments.  Introduces Heart Failure Zone chart for action plan for heart failure.  Written material given at graduation.   Infection  Prevention: - Provides verbal and written material to individual with discussion of infection control including proper hand washing and proper equipment cleaning during exercise session. Flowsheet Row Cardiac Rehab from 06/03/2020 in [facility] Cardiac and Pulmonary Rehab  Date 04/30/20  Educator [facility]  Instruction Review Code 1- Verbalizes Understanding      Falls Prevention: - Provides verbal and written material to individual with discussion of falls prevention and safety. Flowsheet Row Cardiac Rehab from 06/03/2020 in [facility] Cardiac and Pulmonary Rehab  Date 04/30/20  Educator [facility]  Instruction Review Code 1- Verbalizes Understanding      Other: -Provides group and verbal instruction on various topics (see comments)   Knowledge Questionnaire Score:  Knowledge Questionnaire Score - 05/04/20 1227      Knowledge Questionnaire Score   Pre Score 22/26: Angina, Nutrition, Exercise           Core Components/Risk Factors/Patient Goals at Admission:  Personal Goals and Risk Factors at Admission - 05/04/20 1307      Core Components/Risk Factors/Patient Goals on Admission    Weight Management  Yes;Weight Maintenance    Intervention Weight Management: Develop a combined nutrition and exercise program designed to reach desired caloric intake, while maintaining appropriate intake of nutrient and fiber, sodium and fats, and appropriate energy expenditure required for the weight goal.;Weight Management: Provide education and appropriate resources to help participant work on and attain dietary goals.;Weight Management/Obesity: Establish reasonable short term and long term weight goals.    Admit Weight 203 lb (92.1 kg)    Goal Weight: Short Term 203 lb (92.1 kg)    Goal Weight: Long Term 203 lb (92.1 kg)    Expected Outcomes Short Term: Continue to assess and modify interventions until short term weight is achieved;Long Term: Adherence to nutrition and physical activity/exercise program aimed toward  attainment of established weight goal;Weight Maintenance: Understanding of the daily nutrition guidelines, which includes 25-35% calories from fat, 7% or less cal from saturated fats, less than 252m cholesterol, less than 1.5gm of sodium, & 5 or more servings of fruits and vegetables daily;Understanding recommendations for meals to include 15-35% energy as protein, 25-35% energy from fat, 35-60% energy from carbohydrates, less than 2038mof dietary cholesterol, 20-35 gm of total fiber daily;Understanding of distribution of calorie intake throughout the day with the consumption of 4-5 meals/snacks    Diabetes Yes    Intervention Provide education about signs/symptoms and action to take for hypo/hyperglycemia.;Provide education about proper nutrition, including hydration, and aerobic/resistive exercise prescription along with prescribed medications to achieve blood glucose in normal ranges: Fasting glucose 65-99 mg/dL    Expected Outcomes Short Term: Participant verbalizes understanding of the signs/symptoms and immediate care of hyper/hypoglycemia, proper foot care and importance of medication, aerobic/resistive exercise and nutrition plan for blood glucose control.;Long Term: Attainment of HbA1C < 7%.    Heart Failure Yes    Intervention Provide a combined exercise and nutrition program that is supplemented with education, support and counseling about heart failure. Directed toward relieving symptoms such as shortness of breath, decreased exercise tolerance, and extremity edema.    Expected Outcomes Improve functional capacity of life;Short term: Attendance in program 2-3 days a week with increased exercise capacity. Reported lower sodium intake. Reported increased fruit and vegetable intake. Reports medication compliance.;Short term: Daily weights obtained and reported for increase. Utilizing diuretic protocols set by physician.;Long term: Adoption of self-care skills and reduction of barriers for early  signs and symptoms recognition and intervention leading to self-care maintenance.    Hypertension Yes    Intervention Provide education on lifestyle modifcations including regular physical activity/exercise, weight management, moderate sodium restriction and increased consumption of fresh fruit, vegetables, and low fat dairy, alcohol moderation, and smoking cessation.;Monitor prescription use compliance.    Expected Outcomes Short Term: Continued assessment and intervention until BP is < 140/9087mG in hypertensive participants. < 130/38m55m in hypertensive participants with diabetes, heart failure or chronic kidney disease.;Long Term: Maintenance of blood pressure at goal levels.    Lipids Yes    Intervention Provide education and support for participant on nutrition & aerobic/resistive exercise along with prescribed medications to achieve LDL <70mg59mL >40mg.82mExpected Outcomes Short Term: Participant states understanding of desired cholesterol values and is compliant with medications prescribed. Participant is following exercise prescription and nutrition guidelines.;Long Term: Cholesterol controlled with medications as prescribed, with individualized exercise RX and with personalized nutrition plan. Value goals: LDL < 70mg, 62m> 40 mg.           Education:Diabetes - Individual verbal and written instruction to review signs/symptoms of  diabetes, desired ranges of glucose level fasting, after meals and with exercise. Acknowledge that pre and post exercise glucose checks will be done for 3 sessions at entry of program. [place] from 06/03/2020 in [facility] Cardiac and Pulmonary Rehab  Date 04/30/20  Educator [facility]  Instruction Review Code 1- Verbalizes Understanding  [States nver been diagnosed with DM and does not check BG]      Core Components/Risk Factors/Patient Goals Review:   Goals and Risk Factor Review    Row Name 06/08/20 1411             Core Components/Risk  Factors/Patient Goals Review   Personal Goals Review Weight Management/Obesity;Hypertension;Diabetes;Heart Failure;Lipids       Review His weight is holding steady over the last 30 days.  He denies any heart falure symptoms and he is back to sleeping on his side.  He is staying on top of his lasix and gets between 1600-2000 ml fluid each day and he continues to measure it daily.  Blood pressures have been good in class and  he checks it on occassion at home and when he feels a little off.  We talked about the benefits of track his pressures.  He does not check his sugars as he is diet controlled.  He has not had any feelings of feeling off with them.       Expected Outcomes Short: Continue to montior weight closely Long: Conitnue to manage heart failure.              Core Components/Risk Factors/Patient Goals at Discharge (Final Review):   Goals and Risk Factor Review - 06/08/20 1411      Core Components/Risk Factors/Patient Goals Review   Personal Goals Review Weight Management/Obesity;Hypertension;Diabetes;Heart Failure;Lipids    Review His weight is holding steady over the last 30 days.  He denies any heart falure symptoms and he is back to sleeping on his side.  He is staying on top of his lasix and gets between 1600-2000 ml fluid each day and he continues to measure it daily.  Blood pressures have been good in class and  he checks it on occassion at home and when he feels a little off.  We talked about the benefits of track his pressures.  He does not check his sugars as he is diet controlled.  He has not had any feelings of feeling off with them.    Expected Outcomes Short: Continue to montior weight closely Long: Conitnue to manage heart failure.           ITP Comments:  ITP Comments    Row Name 04/30/20 0914 05/04/20 1223 05/13/20 1000 05/14/20 1407 05/19/20 1001   ITP Comments Virtual Visit completed. Patient informed on EP and RD appointment and 6 Minute walk test. Patient also  informed of patient health questionnaires on My Chart. Patient Verbalizes understanding. Visit diagnosis can be found in [facility] 03/21/2020. Completed 6MWT and gym orientation. Initial ITP created and sent for review to Dr. Emily Filbert, Medical Director. 30 Day review completed. Medical Director ITP review done, changes made as directed, and signed approval by Medical Director. First full day of exercise!  Patient was oriented to gym and equipment including functions, settings, policies, and procedures.  Patient's individual exercise prescription and treatment plan were reviewed.  All starting workloads were established based on the results of the 6 minute walk test done at initial orientation visit.  The plan for exercise progression was also introduced and progression will  be customized based on patient's performance and goals. Completed initial RD Consultation   [place] Name 06/10/20 0659           ITP Comments 30 Day review completed. Medical Director ITP review done, changes made as directed, and signed approval by Medical Director.              Comments:

## 2020-06-11 ENCOUNTER — Other Ambulatory Visit: Payer: Self-pay

## 2020-06-11 ENCOUNTER — Ambulatory Visit (INDEPENDENT_AMBULATORY_CARE_PROVIDER_SITE_OTHER): Payer: Medicare HMO | Admitting: Pharmacist

## 2020-06-11 VITALS — BP 92/60 | HR 89 | Wt 197.5 lb

## 2020-06-11 DIAGNOSIS — I5021 Acute systolic (congestive) heart failure: Secondary | ICD-10-CM

## 2020-06-11 DIAGNOSIS — I5022 Chronic systolic (congestive) heart failure: Secondary | ICD-10-CM | POA: Diagnosis not present

## 2020-06-11 MED ORDER — ENTRESTO 24-26 MG PO TABS: 1.0000 | ORAL_TABLET | Freq: Two times a day (BID) | ORAL | 3 refills | Status: DC

## 2020-06-11 MED ORDER — METOPROLOL SUCCINATE ER 200 MG PO TB24: 200.0000 mg | ORAL_TABLET | Freq: Every day | ORAL | 3 refills | Status: DC

## 2020-06-11 NOTE — Patient Instructions (Addendum)
STOP carvedilol. START metoprolol succinate 200mg  - 1 tablet once daily  Continue taking your other medications  We will check your labs today and likely decrease the dose of your Lasix (furosemide)  Continue taking your other medications and monitor your blood pressure and heart rate at home

## 2020-06-11 NOTE — Progress Notes (Signed)
Patient ID: Roy [name]                 DOB: [birth year]                      MRN: [id number]     HPI: Roy [name] is a pleasant 75 y.o. male referred by Dr. [name] to pharmacy clinic for HF medication management. 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 Most recent LVEF 20-25% on 04/2020 follow up. At last visit with Dr [name] on 05/27/20, pravastatin was changed to high intensity atorvastatin, pioglitazone was changed to Jardiance, lisinopril was changed to [facility] after 36 hr washout, and spironolactone was started.  Pt presents today in good spirits. Reports tolerating his medications well. Reviewed rational for med changes at last visit with pt. Denies dizziness and falls. Does not recall specific BP readings but thinks some systolic readings have been under 100 at home. At cardiac rehab yesterday, his resting BP was 112/68 and HR was 98. Takes his time when standing up. Has weighed 194-196 at home. Is worried about his diuretics since he takes quite a few - has been urinating frequently. Of note, he does not take potassium supplements and is on Lasix 80mg  daily - KCl is on med list but listed as a clinic administered med from when he was admitted earlier this year. Reports diarrhea when he previously took potassium supplementation. Pt has been using samples for both Jardiance and Entresto so far.  Current CHF meds:  Carvedilol 12.5mg  BID Jardiance 10mg  daily Furosemide 80mg  daily Entresto 24-26mg  BID Spironolactone 12.5mg  daily  BP goal: <130/67mmHg  Family History: Heart attack in his mother; Stroke in his father. There is no history of Colon cancer, Esophageal cancer, Pancreatic cancer, Rectal cancer, Stomach cancer, or Colon polyps.  Social History: Former pipe smoker for 5 years, quit in 1978. Denies drug use, occasional alcohol use.  Exercise:  Cardiac  rehab 2 days a week  Diet: Does not add salt to his food. Likes meat, potatoes, fruits and vegetables. Has cut back on red meat. Has lost about 25 lbs since earlier this year. Eats 2 meals a day - english muffin, grits, or oatmeal for breakfast with coffee and juice.   Wt Readings from Last 3 Encounters:  05/27/20 200 lb 6.4 oz (90.9 kg)  05/18/20 205 lb 3.2 oz (93.1 kg)  05/11/20 200 lb (90.7 kg)   BP Readings from Last 3 Encounters:  06/04/20 103/65  05/27/20 112/62  05/18/20 108/73   Pulse Readings from Last 3 Encounters:  06/04/20 84  05/27/20 91  05/18/20 92    Renal function: CrCl cannot be calculated (Patient's most recent lab result is older than the maximum 21 days allowed.).  Past Medical History:  Diagnosis Date  . Allergic rhinitis   . Arthritis    wrists  . Cardiomyopathy, nonischemic [facility])    followed by cardiology--- dr [name]---  2016 ef 45-50% ,  2016 nuclear ef 37%,  2017 per echo ef 50-55%  . CHF (congestive heart failure), NYHA class III ([place]) 03/21/2020  . Coronary artery disease   . GERD (gastroesophageal reflux disease)   . Hiatal hernia   . History of kidney stones   . 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 History of syncope (03-06-2020 pt stated has not had sycopal episode in few years, stated it seems to happen in extreme hot conditions)   cardiologist--- dr [name]--- dx recurrent syncope;  nuclear study 11-03-2014 intermediate risk w/ no ischemia, apical hypokinesis, nuclear ef 37%;  event monitor-- 12-21-2015 SB/ ST  no pauses/ arrythmia's;  echo 08-03-2015 ef 50-55%  . Hyperlipidemia   . Hypertension    followed by pcp  . Hypovitaminosis D   . Mass of left testicle   . Nocturia   . Plantar fasciitis   . Presence of surgical incision    01/ 2022  moh's w/ skin graft of nose, per pt still healing and wear bandage daily  . Type 2 diabetes mellitus ([place])     pt is adament that he is not and have been told he is a diabetic but a borderline;  followed by pcp, in pcp note states DM2 and takes 2 meds daily  . Wears glasses   . Wears hearing aid in both ears     Current Outpatient Medications on File Prior to Visit  Medication Sig Dispense Refill  . acetaminophen (TYLENOL) 325 MG tablet Take 2 tablets (650 mg total) by mouth every 4 (four) hours as needed for headache or mild pain.    [name] Apoaequorin (PREVAGEN PO) Take 1 tablet by mouth at bedtime.    [name] aspirin EC 81 MG tablet Take 81 mg by mouth daily. Swallow whole.    [name] atorvastatin (LIPITOR) 40 MG tablet Take 1 tablet (40 mg total) by mouth daily. 90 tablet 3  . carvedilol (COREG) 12.5 MG tablet Take 1 tablet (12.5 mg total) by mouth 2 (two) times daily with a meal. 180 tablet 1  . cholecalciferol (VITAMIN D) 1000 UNITS tablet Take 1,000 Units by mouth See admin instructions. Mon-friday    . COVID-19 mRNA vaccine, Pfizer, 30 MCG/0.3ML injection USE AS DIRECTED .3 mL 0  . empagliflozin (JARDIANCE) 10 MG TABS tablet Take 1 tablet (10 mg total) by mouth daily before breakfast. 90 tablet 1  . furosemide (LASIX) 80 MG tablet Take 1 tablet (80 mg total) by mouth daily. 30 tablet 11  . glucosamine-chondroitin 500-400 MG tablet Take 1 tablet by mouth. 5 times weekly    . metFORMIN (GLUCOPHAGE) 1000 MG tablet Take 1,000 mg by mouth 2 (two) times daily with a meal.     . Multiple Vitamin (MULTIVITAMIN WITH MINERALS) TABS tablet Take 1 tablet by mouth. 5 times weekly    . omeprazole (PRILOSEC) 20 MG capsule Take 20 mg by mouth daily.    . sacubitril-valsartan (ENTRESTO) 24-26 MG Take 1 tablet by mouth 2 (two) times daily.    [name] spironolactone (ALDACTONE) 25 MG tablet Take 0.5 tablets (12.5 mg total) by mouth daily. 60 tablet 1  . tamsulosin (FLOMAX) 0.4 MG CAPS capsule Take 0.4 mg by mouth at bedtime.    . vitamin B-12 (CYANOCOBALAMIN) 1000 MCG tablet Take 1,000 mcg by mouth. 5 times a week     Current  Facility-Administered Medications on File Prior to Visit  Medication Dose Route Frequency Provider Last Rate Last Admin  . potassium chloride (KLOR-CON) CR tablet 20 mEq  20 mEq Oral BID [name], MD        No Known Allergies   Assessment/Plan:  1. CHF - BP soft in clinic today which may limit dose titration for CHF meds. Will stop carvedilol 12.5mg  BID and replace it with higher  dose of Toprol 200mg  daily for target CHF beta blocker dosing but less effect on blood pressure. Checking BMET today with recent med changes including initiation of spironolactone 12.5mg  daily, Jardiance 10mg  daily, and Entresto 24-26mg  BID. He has remained on Lasix 80mg  daily - advised pt we will likely decrease this dose due to addition of other meds with diuretic effects, but will wait for his labs to result first. Of note, he was not prescribed potassium supplementation, the KCl on his med list is a clinic administered dose from earlier this year during his hospitalization. Also discussed med copays with pt since both his Delene Loll and Jardiance have $45/month copays. He does not think he will qualify for patient assistance and is ok with copays for now. He will keep follow up with Dr [name] on 6/1, hopefully will be able to titrate either Entresto or spironolactone at that visit pending BP.  Roy [name], PharmD, BCACP, [place] [address] [place], [place] [address] Phone: [phone]; Fax: [phone] 06/11/2020 2:41 PM

## 2020-06-12 ENCOUNTER — Telehealth: Payer: Self-pay | Admitting: Pharmacist

## 2020-06-12 LAB — BASIC METABOLIC PANEL
BUN/Creatinine Ratio: 16 (ref 10–24)
BUN: 23 mg/dL (ref 8–27)
CO2: 21 mmol/L (ref 20–29)
Calcium: 8.2 mg/dL — ABNORMAL LOW (ref 8.6–10.2)
Chloride: 99 mmol/L (ref 96–106)
Creatinine, Ser: 1.43 mg/dL — ABNORMAL HIGH (ref 0.76–1.27)
Glucose: 197 mg/dL — ABNORMAL HIGH (ref 65–99)
Potassium: 3.7 mmol/L (ref 3.5–5.2)
Sodium: 138 mmol/L (ref 134–144)
eGFR: 51 mL/min/{1.73_m2} — ABNORMAL LOW (ref >59)

## 2020-06-12 NOTE — Telephone Encounter (Signed)
I will get him sorted out. Thanks!  [place] T. [name], MD, [place]  [address], [place], [place] [address] [phone]  12:35 PM

## 2020-06-12 NOTE — Telephone Encounter (Signed)
Spoke with patient and advised him to decrease furosemide to 40mg  (1/2 tablet of 80mg ) daily. He should have labs repeated at apt with Dr. [name] on 6/1

## 2020-06-12 NOTE — Telephone Encounter (Signed)
Bump in scr. Patient started on SGLT2, Entresto and spironolactone all at the same time. Most likely over diuresis. Will reduce furosemide to 40mg  daily. Repeat labs on 6/1 at apt with Dr. [name]. Called pt. Spoke with his wife and asked her to have patient call us back

## 2020-06-15 DIAGNOSIS — C6212 Malignant neoplasm of descended left testis: Secondary | ICD-10-CM | POA: Diagnosis not present

## 2020-06-16 MED ORDER — FUROSEMIDE 80 MG PO TABS: 40.0000 mg | ORAL_TABLET | Freq: Every day | ORAL | 11 refills | Status: DC

## 2020-06-18 ENCOUNTER — Encounter: Payer: Self-pay | Admitting: Cardiothoracic Surgery

## 2020-06-22 NOTE — Progress Notes (Signed)
[name]    HEMATOLOGY/ONCOLOGY CLINIC NOTE  Date of Service: 06/23/2020  Patient Care Team: [name], DO as PCP - General (Family Medicine) O'Neal, [name], MD as PCP - Cardiology (Cardiology)  CHIEF COMPLAINTS/PURPOSE OF CONSULTATION:  Primary testicular large B cell lymphoma  HISTORY OF PRESENTING ILLNESS:   Roy [name] is a wonderful 75 y.o. male who has been referred to [place] by Dr Maudie Mercury for evaluation and management of primary testicular large B-cell lymphoma  Patient has a history of hypertension, borderline diabetes, previous nonischemic cardiomyopathy but with a recent MI,, CHF who presented with a left testicular swelling end of sept/ early oct 2021.  He notes that he was seen by Urology - Dr Minette Brine and had an ultrasound--possible infection was suspected and he received 2 rounds of abx without significant improvement.  He notes he received 2 subsequent follow-up ultrasounds in December in January and finally had left transinguinal orchiectomy on 03/11/2020. He notes he had persistent testicular swelling post operatively which required drainage.  He also notes having issues with urinary retention and had a urinary catheter for 2 weeks, abx+  Pathology showed primary testicular large B-cell lymphoma.  No other additional imaging studies for staging have been done at this time.  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  Patient was admitted to the hospital on 03/21/2020 with acute onset dyspnea epigastric pain and cough with pink frothy sputum.  He was noted to have hypoxia with oxygen saturation of 82% on arrival.  He was noted to have non-ST elevation MI on 2/26.  CTA chest ruled out PE.  His symptoms from fluid with aggressive diuresis.  He was noted to have severe triple-vessel disease on cath with an ejection fraction of 25 to 35% with no significant valvular lesions.  He subsequently had CORONARY ARTERY BYPASS  GRAFTING (CABG), ON PUMP, TIMES FOUR, USING BILATERAL INTERNAL MAMMARY ARTERIES AND LEFT RADIAL ARTERY .. On March 25, 2020.  Patient notes he is gradually recovering from his cardiac surgery notes being fatigued having gone through multiple urologic issues, skin surgery for squamous of carcinoma of the nose, acute MI CABG x4.  Patient notes no fevers no chills no night sweats. He notes he might have lost some weight but difficult to quantify in the setting of fluid overload and diuresis. Notes he is starting to eat a little better. Minimal left scrotal swelling. No other acute new focal symptoms.  INTERVAL HISTORY  Roy [name] is a wonderful 76 y.o. male who is here today for evaluation and management of Primary testicular large B cell lymphoma. The patient's last visit with [place] was on 05/18/2020. The pt reports that he is doing well overall. We are joined today by his wife.  The pt reports that he has been gradually improving each day and gaining strength. He notes some changes in his medications. His Cardiologist notes that they are okay to proceed with chemotherapy if needed. He did not do a repeat ECHO yet at this time. They have decreased his Lasix from 80 mg to 40 mg daily due to dehydration on chemistries. He has been more active and goes to cardiac rehab twice weekly for 1-1.5 hours. He notes he was able to weedeat for 45 minutes yesterday before needing a break. He continues to eat well, around 50% her normal baseline one year ago. The pt notes the strength and ROM in his right arm is not as it was  prior to his rotator cuff tear and surgery. He is not able to do as much around the house and in the yard, such as still unable to trim the bushes. The pt notes that he has a lost a total of around 25 pounds and 4 inches in his waist. He is able to still sleep well and only wakes up around every 6 hours to pee.  Lab results 06/11/2020 of BMP is as follows: all values are WNL except for Glucose of  197, Creatinine of 1.43, eGFR of 51, Calcium of 8.2.  On review of systems, pt denies difficulty sleeping, urinary pain, difficulty urinating, blood in urine, poor urinary output, acute headaches, changes in vision, and any other symptoms.  MEDICAL HISTORY:  Past Medical History:  Diagnosis Date  . Allergic rhinitis   . Arthritis    wrists  . Cardiomyopathy, nonischemic [facility])    followed by cardiology--- dr [name]---  2016 ef 45-50% ,  2016 nuclear ef 37%,  2017 per echo ef 50-55%  . CHF (congestive heart failure), NYHA class III ([place]) 03/21/2020  . Coronary artery disease   . GERD (gastroesophageal reflux disease)   . Hiatal hernia   . History of kidney stones   . 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 History of syncope (03-06-2020 pt stated has not had sycopal episode in few years, stated it seems to happen in extreme hot conditions)   cardiologist--- dr [name]--- dx recurrent syncope;  nuclear study 11-03-2014 intermediate risk w/ no ischemia, apical hypokinesis, nuclear ef 37%;  event monitor-- 12-21-2015 SB/ ST  no pauses/ arrythmia's;  echo 08-03-2015 ef 50-55%  . Hyperlipidemia   . Hypertension    followed by pcp  . Hypovitaminosis D   . Mass of left testicle   . Nocturia   . Plantar fasciitis   . Presence of surgical incision    01/ 2022  moh's w/ skin graft of nose, per pt still healing and wear bandage daily  . Type 2 diabetes mellitus ([place])    pt is adament that he is not and have been told he is a diabetic but a borderline;  followed by pcp, in pcp note states DM2 and takes 2 meds daily  . Wears glasses   . Wears hearing aid in both ears     SURGICAL HISTORY: Past Surgical History:  Procedure Laterality Date  . COLONOSCOPY  last one 01-30-2017  . CORONARY ARTERY BYPASS GRAFT N/A 03/25/2020   Procedure: CORONARY ARTERY BYPASS GRAFTING (CABG), ON PUMP, TIMES FOUR, USING BILATERAL  INTERNAL MAMMARY ARTERIES AND LEFT RADIAL ARTERY;  Surgeon: [name], MD;  Location: [facility];  Service: Open Heart Surgery;  Laterality: N/A;  . LOW ANTERIOR RESECTION RECTUM W/ COLOPROCTOSTOMY  05/2003  . MOHS SURGERY  01/2020   nose w/ graft  . ORCHIECTOMY Left 03/11/2020   Procedure: [place];  Surgeon: [name], MD;  Location[facility];  Service: Urology;  Laterality: Left;  ONLY NEEDS 60 MIN  . RADIAL ARTERY HARVEST Left 03/25/2020   Procedure: LEFT RADIAL ARTERY HARVEST;  Surgeon: [name], MD;  Location: [facility];  Service: Open Heart Surgery;  Laterality: Left;  . RIGHT/LEFT HEART CATH AND CORONARY ANGIOGRAPHY N/A 03/23/2020   Procedure: RIGHT/LEFT HEART CATH AND CORONARY ANGIOGRAPHY;  Surgeon: [name], MD;  Location: [facility] INVASIVE CV  LAB;  Service: Cardiovascular;  Laterality: N/A;  . SHOULDER SURGERY Right 1992; 07/ 2021  . 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 TEE WITHOUT CARDIOVERSION N/A 03/25/2020   Procedure: TRANSESOPHAGEAL ECHOCARDIOGRAM (TEE);  Surgeon: [name], MD;  Location: [facility];  Service: Open Heart Surgery;  Laterality: N/A;  . TONSILLECTOMY AND ADENOIDECTOMY  child  . UPPER GASTROINTESTINAL ENDOSCOPY  last one 06-06-2017    SOCIAL HISTORY: Social History   Socioeconomic History  . Marital status: Married    Spouse name: Not on file  . Number of children: Not on file  . Years of education: Not on file  . Highest education level: Not on file  Occupational History  . Not on file  Tobacco Use  . Smoking status: Former Smoker    Years: 5.00    Types: Pipe    Quit date: 11/29/1976    Years since quitting: 43.5  . Smokeless tobacco: Never Used  Vaping Use  . Vaping Use: Never used  Substance and Sexual Activity  . Alcohol use: Yes    Alcohol/week: 0.0 standard drinks    Comment: Occasional drink   . Drug use: Never  . Sexual  activity: Not on file  Other Topics Concern  . Not on file  Social History Narrative  . Not on file   Social Determinants of Health   Financial Resource Strain: Not on file  Food Insecurity: Not on file  Transportation Needs: Not on file  Physical Activity: Not on file  Stress: Not on file  Social Connections: Not on file  Intimate Partner Violence: Not on file    FAMILY HISTORY: Family History  Problem Relation Age of Onset  . Heart attack Mother   . Stroke Father   . Colon cancer Neg Hx   . Esophageal cancer Neg Hx   . Pancreatic cancer Neg Hx   . Rectal cancer Neg Hx   . Stomach cancer Neg Hx   . Colon polyps Neg Hx     ALLERGIES:  has No Known Allergies.  MEDICATIONS:  Current Outpatient Medications  Medication Sig Dispense Refill  . acetaminophen (TYLENOL) 325 MG tablet Take 2 tablets (650 mg total) by mouth every 4 (four) hours as needed for headache or mild pain.    [name] Apoaequorin (PREVAGEN PO) Take 1 tablet by mouth at bedtime.    [name] aspirin EC 81 MG tablet Take 81 mg by mouth daily. Swallow whole.    [name] atorvastatin (LIPITOR) 40 MG tablet Take 1 tablet (40 mg total) by mouth daily. 90 tablet 3  . cholecalciferol (VITAMIN D) 1000 UNITS tablet Take 1,000 Units by mouth See admin instructions. Mon-friday    . COVID-19 mRNA vaccine, Pfizer, 30 MCG/0.3ML injection USE AS DIRECTED .3 mL 0  . empagliflozin (JARDIANCE) 10 MG TABS tablet Take 1 tablet (10 mg total) by mouth daily before breakfast. 90 tablet 1  . furosemide (LASIX) 80 MG tablet Take 0.5 tablets (40 mg total) by mouth daily. 30 tablet 11  . glucosamine-chondroitin 500-400 MG tablet Take 1 tablet by mouth. 5 times weekly    . metFORMIN (GLUCOPHAGE) 1000 MG tablet Take 1,000 mg by mouth 2 (two) times daily with a meal.     . metoprolol (TOPROL XL) 200 MG 24 hr tablet Take 1 tablet (200 mg total) by mouth daily. 90 tablet 3  . Multiple Vitamin (MULTIVITAMIN WITH MINERALS) TABS tablet Take 1 tablet  by mouth. 5  times weekly    . omeprazole (PRILOSEC) 20 MG capsule Take 20 mg by mouth daily.    . sacubitril-valsartan (ENTRESTO) 24-26 MG Take 1 tablet by mouth 2 (two) times daily. 60 tablet 3  . spironolactone (ALDACTONE) 25 MG tablet Take 0.5 tablets (12.5 mg total) by mouth daily. 60 tablet 1  . tamsulosin (FLOMAX) 0.4 MG CAPS capsule Take 0.4 mg by mouth at bedtime.    . vitamin B-12 (CYANOCOBALAMIN) 1000 MCG tablet Take 1,000 mcg by mouth. 5 times a week     Current Facility-Administered Medications  Medication Dose Route Frequency Provider Last Rate Last Admin  . potassium chloride (KLOR-CON) CR tablet 20 mEq  20 mEq Oral BID [name], MD        REVIEW OF SYSTEMS:    10 Point review of Systems was done is negative except as noted above.  PHYSICAL EXAMINATION: ECOG PERFORMANCE STATUS: 2 - Symptomatic, <50% confined to bed  . Vitals:   06/23/20 1425  BP: 123/79  Pulse: 91  Resp: 18  Temp: 97.7 F (36.5 C)  SpO2: 100%   Filed Weights   06/23/20 1425  Weight: 197 lb 1.6 oz (89.4 kg)   .Body mass index is 26 kg/m.   GENERAL:alert, in no acute distress and comfortable SKIN: no acute rashes, no significant lesions EYES: conjunctiva are pink and non-injected, sclera anicteric OROPHARYNX: MMM, no exudates, no oropharyngeal erythema or ulceration NECK: supple, no JVD LYMPH:  no palpable lymphadenopathy in the cervical, axillary or inguinal regions LUNGS: clear to auscultation b/l with normal respiratory effort, healing incision of CABG. HEART: regular rate & rhythm ABDOMEN:  normoactive bowel sounds , non tender, not distended.  Healed left inguinal incision and some mild scrotal swelling on the left side Extremity: no pedal edema PSYCH: alert & oriented x 3 with fluent speech NEURO: no focal motor/sensory deficits  LABORATORY DATA:  I have reviewed the data as listed   CBC Latest Ref Rng & Units 04/27/2020 04/23/2020 03/31/2020  WBC 4.0 - 10.5 K/uL 8.3 10.0 10.6(H)   Hemoglobin 13.0 - 17.0 g/dL 10.7(L) 10.4(L) 9.3(L)  Hematocrit 39.0 - 52.0 % 33.8(L) 32.8(L) 28.2(L)  Platelets 150 - 400 K/uL 333 346 312    . CMP Latest Ref Rng & Units 06/11/2020 04/27/2020 04/23/2020  Glucose 65 - 99 mg/dL 197(H) 124(H) 145(H)  BUN 8 - 27 mg/dL $Remove'23 15 15  'SHhKXIp$ Creatinine 0.76 - 1.27 mg/dL 1.43(H) 1.13 1.17  Sodium 134 - 144 mmol/L 138 142 140  Potassium 3.5 - 5.2 mmol/L 3.7 4.1 4.4  Chloride 96 - 106 mmol/L 99 102 105  CO2 20 - 29 mmol/L $RemoveB'21 25 25  'xIoZiary$ Calcium 8.6 - 10.2 mg/dL 8.2(L) 8.2(L) 8.6  Total Protein 6.5 - 8.1 g/dL - 7.1 -  Total Bilirubin 0.3 - 1.2 mg/dL - 0.6 -  Alkaline Phos 38 - 126 U/L - 81 -  AST 15 - 41 U/L - 12(L) -  ALT 0 - 44 U/L - 12 -   . Lab Results  Component Value Date   LDH 171 04/27/2020   SURGICAL PATHOLOGY   THIS IS AN ADDENDUM REPORT   CASE: [id number]  PATIENT: Rory Tecson  Surgical Pathology Report  Addendum    Reason for Addendum #1: Molecular Genetic Test Results, FISH   Clinical History: Left testicular mass (crm)      FINAL MICROSCOPIC DIAGNOSIS:   A. TESTICLE, LEFT, ORCHIECTOMY:  - Diffuse aggressive large B-cell lymphoma  -  See comment    COMMENT:   Sections of testicle show architectural effacement by sheets of large  lymphoid cells with vesicular nuclei and pale cytoplasm. There is  admixed apoptotic debris and increased mitotic activity. By  immunohistochemistry, the large lymphoid cells are positive for CD20,  CD5 (dim), BCL6 (dim), MUM1, and BCL2. They are negative for CD10, CD30  (<1%), cyclin D1, and TdT. The Ki67 proliferation index is up to  approximately 70%. CD3 highlights small T-cells in the background. EBV  is negative by in situ hybridization.   Together, the findings support the diagnosis of a diffuse aggressive  large B-cell lymphoma. The differential diagnosis includes a diffuse  large B-cell lymphoma, NOS, with activated B-cell subtype by the [facility]  algorithm and high-grade B-cell  lymphoma with MYC and BCL2 or BCL6  rearrangements. FISH for BCL2, BCL6, and MYC rearrangements will be  performed. Correlation with clinical and radiographic findings is  recommended for consideration of a primary testicular lymphoma.   Result reported to L. Gibson on 03/16/20 at 1720 by S. O'Neill.    ADDENDUM:   FISH RESULTS:   Results: NORMAL   Interpretation:   BCL6 rearrangement:   Not Detected  MYC rearrangement: Not Detected  MYC amplification: Not Detected  BCL6 rearrangement:   Not Detected   ** Please note this testing was performed and interpreted by an outside  facility ([facility]). This addendum is only being added to provide a  summary of the results for report completeness. Please see electronic  medical record for a copy of the full report.**    RADIOGRAPHIC STUDIES: I have personally reviewed the radiological images as listed and agreed with the findings in the report. No results found.  ASSESSMENT & PLAN:   75 year old wonderful gentleman with history of hypertension, borderline diabetes, dyslipidemia, GERD with recent STEMI on 03/21/2020 and status post four-vessel CABG on 03/25/2020.  1) Left-sided primary testicular large B-cell lymphoma. Staging to be determined. BCL-2, BCL 6, c-Myc negative.  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 The pt wishes to start with treatment at this time after understanding all the pros and cons.. -Advised pt we would avoid all heart-toxic medications such as anthracyclines and use very conservative chemotherapy dosing and g-csf support. -We would recommend a  regiment of R-CEOP with dose adjustments significantly, up to 50% to assess tolerance. -G-CSF support -If he tolerates the first two cycles well, we would then have a discussion regarding the intrathecal MTX injections in the spine. The standard is 4 doses but we would desire a minimum of at least 2. The pt notes he needs to elevate his head slightly due to sinuses drainage.  -Will set up for [facility] insertion. The pt prefers this on his right side. -Will set up for chemo counseling. -Continue follow-up with primary, urology and cardiology for continued management of other medical issues. -Will see back in 4 weeks with C1D12.   Orders Placed This Encounter  Procedures  . IR IMAGING GUIDED PORT INSERTION    Standing Status:   Future    Standing Expiration Date:   06/25/2021    Order Specific Question:   Reason for Exam (SYMPTOM  OR DIAGNOSIS REQUIRED)  Answer:   port a cath placement for chemotherapy for primary testicular large b cell lymphoma    Order Specific Question:   Preferred Imaging Location?    Answer:   [facility]    FOLLOW UP: Interventional radiology for Port-A-Cath placement in 1 week Chemo counseling for R -CEOP in 1 week Please schedule port flush, labs and treatment start with R-CEOP on Monday in 2-3 weeks (3 day treatment with Day 5 udenyca) MD visit with port flush and labs on C1D12 for toxicity check    All of the patients questions were answered with apparent satisfaction. The patient knows to call the clinic with any problems, questions or concerns.  The total time spent in the appointment was 40 minutes and more than 50% was on counseling and direct patient cares, ordering and mx of chemotherapy    Sullivan Lone MD MS AAHIVMS [facility] Hematology/Oncology Physician [facility]  (Office):       [phone] (Work cell):  [phone] (Fax):           [phone]  06/23/2020 3:10 PM  I, Reinaldo Raddle, am acting as scribe for Dr. Sullivan Lone,  MD. .I have reviewed the above documentation for accuracy and completeness, and I agree with the above. Brunetta Genera MD

## 2020-06-22 NOTE — Progress Notes (Signed)
Cardiology Office Note:   Date:  06/24/2020  NAME:  [name]    MRN: [id number] DOB:  1945/06/25   PCP:  [name], DO  Cardiologist:  [name], MD   Referring MD: Jani Gravel, MD   Chief Complaint  Patient presents with  . Follow-up        History of Present Illness:   Roy [name] is a 75 y.o. male with a hx of ischemic CM s/p CABG, HTN, DM, HLD who presents for follow-up. Started on entresto at last visit.  He reports he is doing well.  Denies any chest pain or trouble breathing.  He is going to cardiac rehab twice per week.  No issues.  His Lasix dose was reduced due to a rise in serum creatinine.  He really has no complaints otherwise.  Weights are stable.  He is euvolemic.  BP 112/73 prevents further titration of Entresto.  He seems to be on appropriate dose.  He will need a repeat lipid profile at some point.  No concerns for angina.  He reports that he was seen and had bacteria in his urine.  His primary care doctor has ordered more tests.  They are also following up on thyroid studies.  He will forward [place] the results.  We will plan to have him come back in 4 months for repeat echocardiogram.  He will also need a repeat lipid profile in the next few weeks.    Problem List 1. 3vCAD -CABG x 4 03/25/2020 (LIMA-LAD, RIMA-PDA, radial artery OM1/2) 2. Systolic HF, ischemic  -09/9369: 25-30% -4//2022: 20-25% 3. DM -A1c 6.2 4. HLD -T chol 140, HDL 36, LDL 74, TG 148 5. Primary testicular large B-cell lymphoma   Past Medical History: Past Medical History:  Diagnosis Date  . Allergic rhinitis   . Arthritis    wrists  . Cardiomyopathy, nonischemic [facility])    followed by cardiology--- dr [name]---  2016 ef 45-50% ,  2016 nuclear ef 37%,  2017 per echo ef 50-55%  . CHF (congestive heart failure), NYHA class III ([place]) 03/21/2020  . Coronary artery disease   . GERD (gastroesophageal reflux disease)   . Hiatal hernia   . History of kidney stones   . 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 History of syncope (03-06-2020 pt stated has not had sycopal episode in few years, stated it seems to happen in extreme hot conditions)   cardiologist--- dr [name]--- dx recurrent syncope;  nuclear study 11-03-2014 intermediate risk w/ no ischemia, apical hypokinesis, nuclear ef 37%;  event monitor-- 12-21-2015 SB/ ST  no pauses/ arrythmia's;  echo 08-03-2015 ef 50-55%  . Hyperlipidemia   . Hypertension    followed by pcp  . Hypovitaminosis D   . Mass of left testicle   . Nocturia   . Plantar fasciitis   . Presence of surgical incision    01/ 2022  moh's w/ skin graft of nose, per pt still healing and wear bandage daily  . Type 2 diabetes mellitus ([place])    pt is adament that he is not and have been told he is a diabetic but a borderline;  followed by pcp, in pcp note states DM2 and takes 2 meds daily  . Wears glasses   . Wears hearing aid in both ears     Past Surgical History: Past Surgical  History:  Procedure Laterality Date  . COLONOSCOPY  last one 01-30-2017  . CORONARY ARTERY BYPASS GRAFT N/A 03/25/2020   Procedure: CORONARY ARTERY BYPASS GRAFTING (CABG), ON PUMP, TIMES FOUR, USING BILATERAL INTERNAL MAMMARY ARTERIES AND LEFT RADIAL ARTERY;  Surgeon: [name], MD;  Location: [place];  Service: Open Heart Surgery;  Laterality: N/A;  . LOW ANTERIOR RESECTION RECTUM W/ COLOPROCTOSTOMY  05/2003  . MOHS SURGERY  01/2020   nose w/ graft  . ORCHIECTOMY Left 03/11/2020   Procedure: [place];  Surgeon: [name], MD;  Location: [facility];  Service: Urology;  Laterality: Left;  ONLY NEEDS 60 MIN  . RADIAL ARTERY HARVEST Left 03/25/2020   Procedure: LEFT RADIAL ARTERY HARVEST;  Surgeon: [name], MD;  Location: [place];  Service: Open Heart Surgery;  Laterality: Left;  . RIGHT/LEFT HEART CATH AND CORONARY ANGIOGRAPHY N/A 03/23/2020    Procedure: RIGHT/LEFT HEART CATH AND CORONARY ANGIOGRAPHY;  Surgeon: [name], MD;  Location: [place] [facility];  Service: Cardiovascular;  Laterality: N/A;  . SHOULDER SURGERY Right 1992; 07/ 2021  . 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 TEE WITHOUT CARDIOVERSION N/A 03/25/2020   Procedure: TRANSESOPHAGEAL ECHOCARDIOGRAM (TEE);  Surgeon: [name], MD;  Location: [place];  Service: Open Heart Surgery;  Laterality: N/A;  . TONSILLECTOMY AND ADENOIDECTOMY  child  . UPPER GASTROINTESTINAL ENDOSCOPY  last one 06-06-2017    Current Medications: Current Meds  Medication Sig  . acetaminophen (TYLENOL) 325 MG tablet Take 2 tablets (650 mg total) by mouth every 4 (four) hours as needed for headache or mild pain.  [name] Apoaequorin (PREVAGEN PO) Take 1 tablet by mouth at bedtime.  [name] aspirin EC 81 MG tablet Take 81 mg by mouth daily. Swallow whole.  [name] atorvastatin (LIPITOR) 40 MG tablet Take 1 tablet (40 mg total) by mouth daily.  . cholecalciferol (VITAMIN D) 1000 UNITS tablet Take 1,000 Units by mouth See admin instructions. Mon-friday  . empagliflozin (JARDIANCE) 10 MG TABS tablet Take 1 tablet (10 mg total) by mouth daily before breakfast.  . furosemide (LASIX) 80 MG tablet Take 0.5 tablets (40 mg total) by mouth daily.  [name] glucosamine-chondroitin 500-400 MG tablet Take 1 tablet by mouth. 5 times weekly  . metFORMIN (GLUCOPHAGE) 1000 MG tablet Take 1,000 mg by mouth 2 (two) times daily with a meal.   . metoprolol (TOPROL XL) 200 MG 24 hr tablet Take 1 tablet (200 mg total) by mouth daily.  . Multiple Vitamin (MULTIVITAMIN WITH MINERALS) TABS tablet Take 1 tablet by mouth. 5 times weekly  . omeprazole (PRILOSEC) 20 MG capsule Take 20 mg by mouth daily.  . sacubitril-valsartan (ENTRESTO) 24-26 MG Take 1 tablet by mouth 2 (two) times daily.  [name] spironolactone (ALDACTONE) 25 MG tablet Take 0.5 tablets (12.5 mg total) by mouth daily.  .  tamsulosin (FLOMAX) 0.4 MG CAPS capsule Take 0.4 mg by mouth at bedtime.  . vitamin B-12 (CYANOCOBALAMIN) 1000 MCG tablet Take 1,000 mcg by mouth. 5 times a week   Current Facility-Administered Medications for the 06/24/20 encounter (Office Visit) with Geralynn Rile, MD  Medication  . potassium chloride (KLOR-CON) CR tablet 20 mEq     Allergies:    Patient has no known allergies.   Social History: Social History   Socioeconomic History  . Marital status: Married    Spouse name: Not on file  .  Number of children: Not on file  . Years of education: Not on file  . Highest education level: Not on file  Occupational History  . Not on file  Tobacco Use  . Smoking status: Former Smoker    Years: 5.00    Types: Pipe    Quit date: 11/29/1976    Years since quitting: 43.5  . Smokeless tobacco: Never Used  Vaping Use  . Vaping Use: Never used  Substance and Sexual Activity  . Alcohol use: Yes    Alcohol/week: 0.0 standard drinks    Comment: Occasional drink   . Drug use: Never  . Sexual activity: Not on file  Other Topics Concern  . Not on file  Social History Narrative  . Not on file   Social Determinants of Health   Financial Resource Strain: Not on file  Food Insecurity: Not on file  Transportation Needs: Not on file  Physical Activity: Not on file  Stress: Not on file  Social Connections: Not on file     Family History: The patient's family history includes Heart attack in his mother; Stroke in his father. There is no history of Colon cancer, Esophageal cancer, Pancreatic cancer, Rectal cancer, Stomach cancer, or Colon polyps.  ROS:   All other ROS reviewed and negative. Pertinent positives noted in the HPI.     EKGs/Labs/Other Studies Reviewed:   The following studies were personally reviewed by me today:  TTE 05/12/2020 1. Left ventricular ejection fraction, by estimation, is 20 to 25%. Left  ventricular ejection fraction by 2D MOD biplane is 21.8 %. The  left  ventricle has severely decreased function. The left ventricle demonstrates  regional wall motion abnormalities  with global hypokinesis, worse in the inferoseptal, inferior, and  inferolateral distibution. This is confirmed with regional strain mapping.  Left ventricular diastolic parameters are indeterminate. Elevated left  atrial pressure. The average left  ventricular global longitudinal strain is -10.7 %. The global longitudinal  strain is abnormal.  2. Right ventricular systolic function is mildly reduced. The right  ventricular size is normal. There is mildly elevated pulmonary artery  systolic pressure.  3. Left atrial size was mild to moderately dilated.  4. The mitral valve is normal in structure. Trivial mitral valve  regurgitation.  5. The aortic valve is tricuspid. There is mild calcification of the  aortic valve. There is mild thickening of the aortic valve. Aortic valve  regurgitation is not visualized. Mild aortic valve sclerosis is present,  with no evidence of aortic valve   Recent Labs: 03/21/2020: B Natriuretic Peptide 648.5 03/31/2020: Magnesium 1.6 04/27/2020: ALT 12; Hemoglobin 10.7; Platelets 333 06/11/2020: BUN 23; Creatinine, Ser 1.43; Potassium 3.7; Sodium 138   Recent Lipid Panel    Component Value Date/Time   CHOL 140 03/21/2020 1147   TRIG 148 03/21/2020 1147   HDL 36 (L) 03/21/2020 1147   CHOLHDL 3.9 03/21/2020 1147   VLDL 30 03/21/2020 1147   LDLCALC 74 03/21/2020 1147    Physical Exam:   VS:  BP 112/73 (BP Location: Right Arm, Patient Position: Sitting, Cuff Size: Normal)   Pulse 89   Ht 6\' 1"  (1.854 m)   Wt 199 lb 3.2 oz (90.4 kg)   SpO2 100%   BMI 26.28 kg/m    Wt Readings from Last 3 Encounters:  06/24/20 199 lb 3.2 oz (90.4 kg)  06/23/20 197 lb 1.6 oz (89.4 kg)  06/11/20 197 lb 8 oz (89.6 kg)    General: Well nourished, well  developed, in no acute distress Head: Atraumatic, normal size  Eyes: PEERLA, EOMI  Neck: Supple, no  JVD Endocrine: No thryomegaly Cardiac: Normal S1, S2; RRR; no murmurs, rubs, or gallops Lungs: Clear to auscultation bilaterally, no wheezing, rhonchi or rales  Abd: Soft, nontender, no hepatomegaly  Ext: No edema, pulses 2+ Musculoskeletal: No deformities, BUE and BLE strength normal and equal Skin: Warm and dry, no rashes   Neuro: Alert and oriented to person, place, time, and situation, CNII-XII grossly intact, no focal deficits  Psych: Normal mood and affect   ASSESSMENT:   Roy [name] is a 75 y.o. male who presents for the following: 1. Chronic systolic heart failure ([place])   2. Coronary artery disease involving coronary bypass graft of native heart without angina pectoris   3. Mixed hyperlipidemia     PLAN:   1. Chronic systolic heart failure (HCC) -EF 20 to 25%.  Ischemic etiology.  Status post CABG. -He is on metoprolol succinate 20 mg daily, Entresto 24-26 mg twice daily, Aldactone 12.5 mg daily, Jardiance 10 mg daily.  BP soft.  Would recommend to continue this current medications.  We will repeat an echocardiogram in 4 months.  This will give him roughly 4 months of guideline directed medical therapy. -Euvolemic.  Continue Lasix 40 mg daily. -He will undergo chemotherapy for testicular lymphoma.  We will assist his oncologist with any management issues that arise.  2. Coronary artery disease involving coronary bypass graft of native heart without angina pectoris -Status post four-vessel CABG on 03/25/2020. -Continue aspirin. -No issues with bleeding. -We switched him to Lipitor 40 mg daily.  Plan to repeat lipid profile in the next few weeks.  Goal LDL is less than 50. -No symptoms of angina.  Healing well since CABG.  3. Mixed hyperlipidemia -Continue Lipitor.  Repeat lipid profile in the next few weeks. -Goal LDL cholesterol less than 50  Disposition: Return in about 4 months (around 10/24/2020).  Medication Adjustments/Labs and Tests Ordered: Current medicines are  reviewed at length with the patient today.  Concerns regarding medicines are outlined above.  Orders Placed This Encounter  Procedures  . Lipid panel  . ECHOCARDIOGRAM COMPLETE   No orders of the defined types were placed in this encounter.   Patient Instructions  Medication Instructions:  The current medical regimen is effective;  continue present plan and medications.  *If you need a refill on your cardiac medications before your next appointment, please call your pharmacy*   Lab Work: LIPID (come back fasting, nothing to eat or drink, no lab appointment needed)  If you have labs (blood work) drawn today and your tests are completely normal, you will receive your results only by: [name] MyChart Message (if you have MyChart) OR . A paper copy in the mail If you have any lab test that is abnormal or we need to change your treatment, we will call you to review the results.   Testing/Procedures: Echocardiogram (before appointment in 4 months) - Your physician has requested that you have an echocardiogram. Echocardiography is a painless test that uses sound waves to create images of your heart. It provides your doctor with information about the size and shape of your heart and how well your heart's chambers and valves are working. This procedure takes approximately one hour. There are no restrictions for this procedure. This will be performed at our [facility] location - [address], [address].    Follow-Up: At [facility], you and your  health needs are our priority.  As part of our continuing mission to provide you with exceptional heart care, we have created designated Provider Care Teams.  These Care Teams include your primary Cardiologist (physician) and Advanced Practice Providers (APPs -  Physician Assistants and Nurse Practitioners) who all work together to provide you with the care you need, when you need it.  We recommend signing up for the patient portal called "MyChart".   Sign up information is provided on this After Visit Summary.  MyChart is used to connect with patients for Virtual Visits (Telemedicine).  Patients are able to view lab/test results, encounter notes, upcoming appointments, etc.  Non-urgent messages can be sent to your provider as well.   To learn more about what you can do with MyChart, go to [email or web address].    Your next appointment:   4 month(s) after ECHO  The format for your next appointment:   In Person  Provider:   [name], MD         Time Spent with Patient: I have spent a total of 25 minutes with patient reviewing hospital notes, telemetry, EKGs, labs and examining the patient as well as establishing an assessment and plan that was discussed with the patient.  > 50% of time was spent in direct patient care.  Signed, Addison Naegeli. [name], MD, [place]  [address] [place], [place] [address] [phone]  06/24/2020 11:37 AM

## 2020-06-23 ENCOUNTER — Other Ambulatory Visit: Payer: Self-pay

## 2020-06-23 ENCOUNTER — Inpatient Hospital Stay: Payer: Medicare HMO | Admitting: Hematology

## 2020-06-23 VITALS — BP 123/79 | HR 91 | Temp 97.7°F | Resp 18 | Wt 197.1 lb

## 2020-06-23 DIAGNOSIS — I10 Essential (primary) hypertension: Secondary | ICD-10-CM | POA: Diagnosis not present

## 2020-06-23 DIAGNOSIS — C8335 Diffuse large B-cell lymphoma, lymph nodes of inguinal region and lower limb: Secondary | ICD-10-CM | POA: Diagnosis not present

## 2020-06-23 DIAGNOSIS — I509 Heart failure, unspecified: Secondary | ICD-10-CM | POA: Diagnosis not present

## 2020-06-23 DIAGNOSIS — C8330 Diffuse large B-cell lymphoma, unspecified site: Secondary | ICD-10-CM

## 2020-06-23 DIAGNOSIS — Z87891 Personal history of nicotine dependence: Secondary | ICD-10-CM | POA: Diagnosis not present

## 2020-06-23 DIAGNOSIS — Z9079 Acquired absence of other genital organ(s): Secondary | ICD-10-CM | POA: Diagnosis not present

## 2020-06-23 DIAGNOSIS — Z7189 Other specified counseling: Secondary | ICD-10-CM

## 2020-06-23 DIAGNOSIS — I252 Old myocardial infarction: Secondary | ICD-10-CM | POA: Diagnosis not present

## 2020-06-23 DIAGNOSIS — E119 Type 2 diabetes mellitus without complications: Secondary | ICD-10-CM | POA: Diagnosis not present

## 2020-06-23 DIAGNOSIS — I255 Ischemic cardiomyopathy: Secondary | ICD-10-CM | POA: Diagnosis not present

## 2020-06-23 DIAGNOSIS — Z951 Presence of aortocoronary bypass graft: Secondary | ICD-10-CM | POA: Diagnosis not present

## 2020-06-23 DIAGNOSIS — E785 Hyperlipidemia, unspecified: Secondary | ICD-10-CM | POA: Diagnosis not present

## 2020-06-23 DIAGNOSIS — Z85820 Personal history of malignant melanoma of skin: Secondary | ICD-10-CM | POA: Diagnosis not present

## 2020-06-23 DIAGNOSIS — R8290 Unspecified abnormal findings in urine: Secondary | ICD-10-CM | POA: Diagnosis not present

## 2020-06-23 DIAGNOSIS — Z298 Encounter for other specified prophylactic measures: Secondary | ICD-10-CM | POA: Diagnosis not present

## 2020-06-24 ENCOUNTER — Ambulatory Visit: Payer: Medicare HMO | Admitting: Cardiovascular Disease

## 2020-06-24 ENCOUNTER — Encounter: Payer: Self-pay | Admitting: Cardiovascular Disease

## 2020-06-24 ENCOUNTER — Encounter: Payer: Medicare HMO | Attending: Cardiovascular Disease | Admitting: *Deleted

## 2020-06-24 VITALS — BP 112/73 | HR 89 | Ht 73.0 in | Wt 199.2 lb

## 2020-06-24 DIAGNOSIS — Z951 Presence of aortocoronary bypass graft: Secondary | ICD-10-CM

## 2020-06-24 DIAGNOSIS — I25810 Atherosclerosis of coronary artery bypass graft(s) without angina pectoris: Secondary | ICD-10-CM

## 2020-06-24 DIAGNOSIS — I5022 Chronic systolic (congestive) heart failure: Secondary | ICD-10-CM

## 2020-06-24 DIAGNOSIS — E782 Mixed hyperlipidemia: Secondary | ICD-10-CM | POA: Diagnosis not present

## 2020-06-24 NOTE — Patient Instructions (Signed)
Medication Instructions:  The current medical regimen is effective;  continue present plan and medications.  *If you need a refill on your cardiac medications before your next appointment, please call your pharmacy*   Lab Work: LIPID (come back fasting, nothing to eat or drink, no lab appointment needed)  If you have labs (blood work) drawn today and your tests are completely normal, you will receive your results only by: [name] MyChart Message (if you have MyChart) OR . A paper copy in the mail If you have any lab test that is abnormal or we need to change your treatment, we will call you to review the results.   Testing/Procedures: Echocardiogram (before appointment in 4 months) - Your physician has requested that you have an echocardiogram. Echocardiography is a painless test that uses sound waves to create images of your heart. It provides your doctor with information about the size and shape of your heart and how well your heart's chambers and valves are working. This procedure takes approximately one hour. There are no restrictions for this procedure. This will be performed at our [facility] location - [address].    Follow-Up: At [facility], you and your health needs are our priority.  As part of our continuing mission to provide you with exceptional heart care, we have created designated Provider Care Teams.  These Care Teams include your primary Cardiologist (physician) and Advanced Practice Providers (APPs -  Physician Assistants and Nurse Practitioners) who all work together to provide you with the care you need, when you need it.  We recommend signing up for the patient portal called "MyChart".  Sign up information is provided on this After Visit Summary.  MyChart is used to connect with patients for Virtual Visits (Telemedicine).  Patients are able to view lab/test results, encounter notes, upcoming appointments, etc.  Non-urgent messages can be sent to your  provider as well.   To learn more about what you can do with MyChart, go to [email or web address].    Your next appointment:   4 month(s) after ECHO  The format for your next appointment:   In Person  Provider:   [name], MD

## 2020-06-24 NOTE — Progress Notes (Signed)
Daily Session Note  Patient Details  Name: Roy [name] MRN: [id number] Date of Birth: [birth year] Referring Provider:   Flowsheet Row Cardiac Rehab from 05/04/2020 in [facility] Cardiac and Pulmonary Rehab  Referring Provider [name] MD      Encounter Date: 06/24/2020  Check In:  Session Check In - 06/24/20 1404      Check-In   Supervising physician immediately available to respond to emergencies See telemetry face sheet for immediately available ER MD    Location [facility]-Cardiac & Pulmonary Rehab    Staff Present Renita Papa, RN Margurite Auerbach, MS, ASCM CEP, Exercise Physiologist;Melissa Caiola RDN, LDN    Virtual Visit No    Medication changes reported     No    Warm-up and Cool-down Performed on first and last piece of equipment    Resistance Training Performed Yes    VAD Patient? No    PAD/SET Patient? No      Pain Assessment   Currently in Pain? No/denies              Social History   Tobacco Use  Smoking Status Former Smoker  . Years: 5.00  . Types: Pipe  . Quit date: 11/29/1976  . Years since quitting: 43.5  Smokeless Tobacco Never Used    Goals Met:  Independence with exercise equipment Exercise tolerated well No report of cardiac concerns or symptoms Strength training completed today  Goals Unmet:  Not Applicable  Comments: Pt able to follow exercise prescription today without complaint.  Will continue to monitor for progression.    Dr. Emily Filbert is Medical Director for [place].  Dr. Ottie Glazier is Medical Director for [facility] Pulmonary Rehabilitation.

## 2020-06-26 ENCOUNTER — Encounter: Payer: Self-pay | Admitting: Hematology

## 2020-06-26 DIAGNOSIS — Z7189 Other specified counseling: Secondary | ICD-10-CM | POA: Insufficient documentation

## 2020-06-26 NOTE — Progress Notes (Signed)
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

## 2020-06-29 ENCOUNTER — Inpatient Hospital Stay: Payer: Medicare HMO | Attending: Hematology

## 2020-06-29 ENCOUNTER — Encounter: Payer: Self-pay | Admitting: Hematology

## 2020-06-29 ENCOUNTER — Other Ambulatory Visit: Payer: Self-pay

## 2020-06-29 DIAGNOSIS — Z951 Presence of aortocoronary bypass graft: Secondary | ICD-10-CM

## 2020-06-29 DIAGNOSIS — Z5189 Encounter for other specified aftercare: Secondary | ICD-10-CM | POA: Insufficient documentation

## 2020-06-29 DIAGNOSIS — Z79899 Other long term (current) drug therapy: Secondary | ICD-10-CM | POA: Insufficient documentation

## 2020-06-29 DIAGNOSIS — Z5112 Encounter for antineoplastic immunotherapy: Secondary | ICD-10-CM | POA: Insufficient documentation

## 2020-06-29 DIAGNOSIS — Z5111 Encounter for antineoplastic chemotherapy: Secondary | ICD-10-CM | POA: Insufficient documentation

## 2020-06-29 DIAGNOSIS — C8335 Diffuse large B-cell lymphoma, lymph nodes of inguinal region and lower limb: Secondary | ICD-10-CM | POA: Insufficient documentation

## 2020-06-29 NOTE — Progress Notes (Signed)
Met with patient/accompanying adult at registration to introduce myself as Arboriculturist and to offer available resources.  Discussed one-time $1000 Radio broadcast assistant to assist with personal expenses while going through treatment. Advised income guidelines and what is needed to apply.  Gave him my card if interested in applying and for any additional financial questions or concerns.

## 2020-06-29 NOTE — Progress Notes (Signed)
Daily Session Note  Patient Details  Name: Roy [name] MRN: [id number] Date of Birth: 1945-07-26 Referring Provider:   Flowsheet Row Cardiac Rehab from 05/04/2020 in [facility] Cardiac and Pulmonary Rehab  Referring Provider [name] MD      Encounter Date: 06/29/2020  Check In:  Session Check In - 06/29/20 1406      Check-In   Supervising physician immediately available to respond to emergencies See telemetry face sheet for immediately available ER MD    Location [facility]-Cardiac & Pulmonary Rehab    Staff Present Birdie Sons, MPA, Mauricia Area, BS, ACSM CEP, Exercise Physiologist;Jessica Luan Pulling, MA, RCEP, CCRP, CCET    Virtual Visit No    Medication changes reported     No    Fall or balance concerns reported    No    Warm-up and Cool-down Performed on first and last piece of equipment    Resistance Training Performed Yes    VAD Patient? No    PAD/SET Patient? No      Pain Assessment   Currently in Pain? No/denies              Social History   Tobacco Use  Smoking Status Former Smoker  . Years: 5.00  . Types: Pipe  . Quit date: 11/29/1976  . Years since quitting: 43.6  Smokeless Tobacco Never Used    Goals Met:  Independence with exercise equipment Exercise tolerated well No report of cardiac concerns or symptoms Strength training completed today  Goals Unmet:  Not Applicable  Comments: Pt able to follow exercise prescription today without complaint.  Will continue to monitor for progression.    Dr. Emily Filbert is Medical Director for [place].  Dr. Ottie Glazier is Medical Director for [facility] Pulmonary Rehabilitation.

## 2020-06-30 ENCOUNTER — Telehealth: Payer: Self-pay | Admitting: *Deleted

## 2020-06-30 NOTE — Telephone Encounter (Signed)
Received vm call from pt regarding treatment appts.  1st appt seen in computer is 7/6.  Port to be placed 6/13.  Returned call to pt & pt reports that Abigail Butts RN had told them yesterday that treatment will be in June.  Wife reports that she has a part-time job & will be working 3rd w/e once/mo & 6/17, 18, & 19 she will be working & doesn't want to be away if pt gets treatment .  Message routed to Kale/RN/pod & message sent to schedulers to f/u with pt.

## 2020-07-01 ENCOUNTER — Telehealth: Payer: Self-pay | Admitting: Hematology

## 2020-07-01 ENCOUNTER — Other Ambulatory Visit: Payer: Self-pay

## 2020-07-01 DIAGNOSIS — Z951 Presence of aortocoronary bypass graft: Secondary | ICD-10-CM | POA: Diagnosis not present

## 2020-07-01 NOTE — Progress Notes (Signed)
Daily Session Note  Patient Details  Name: Roy [name] MRN: [id number] Date of Birth: 10/29/1945 Referring Provider:   Flowsheet Row Cardiac Rehab from 05/04/2020 in [facility] Cardiac and Pulmonary Rehab  Referring Provider [name] MD      Encounter Date: 07/01/2020  Check In:  Session Check In - 07/01/20 1407      Check-In   Supervising physician immediately available to respond to emergencies See telemetry face sheet for immediately available ER MD    Location [facility]-Cardiac & Pulmonary Rehab    Staff Present Birdie Sons, MPA, RN;Joseph Lou Miner, MS, ASCM CEP, Exercise Physiologist    Virtual Visit No    Medication changes reported     No    Fall or balance concerns reported    No    Warm-up and Cool-down Performed on first and last piece of equipment    Resistance Training Performed Yes    VAD Patient? No    PAD/SET Patient? No      Pain Assessment   Currently in Pain? No/denies              Social History   Tobacco Use  Smoking Status Former Smoker  . Years: 5.00  . Types: Pipe  . Quit date: 11/29/1976  . Years since quitting: 43.6  Smokeless Tobacco Never Used    Goals Met:  Independence with exercise equipment Exercise tolerated well No report of cardiac concerns or symptoms Strength training completed today  Goals Unmet:  Not Applicable  Comments: Pt able to follow exercise prescription today without complaint.  Will continue to monitor for progression.    Dr. Emily Filbert is Medical Director for [place].  Dr. Ottie Glazier is Medical Director for [facility] Pulmonary Rehabilitation.

## 2020-07-01 NOTE — Telephone Encounter (Signed)
Scheduled follow-up appointments per 5/31 los. Patient's wife is aware.

## 2020-07-03 ENCOUNTER — Other Ambulatory Visit: Payer: Self-pay | Admitting: Student

## 2020-07-03 DIAGNOSIS — E782 Mixed hyperlipidemia: Secondary | ICD-10-CM | POA: Diagnosis not present

## 2020-07-03 LAB — LIPID PANEL
Chol/HDL Ratio: 3.1 ratio (ref 0.0–5.0)
Cholesterol, Total: 128 mg/dL (ref 100–199)
HDL: 41 mg/dL (ref >39)
LDL Chol Calc (NIH): 65 mg/dL (ref 0–99)
Triglycerides: 124 mg/dL (ref 0–149)
VLDL Cholesterol Cal: 22 mg/dL (ref 5–40)

## 2020-07-06 ENCOUNTER — Ambulatory Visit (HOSPITAL_COMMUNITY)
Admission: RE | Admit: 2020-07-06 | Discharge: 2020-07-06 | Disposition: A | Discharge status: Home / Self Care | Payer: Medicare HMO | Source: Ambulatory Visit | Attending: Hematology | Admitting: Hematology

## 2020-07-06 ENCOUNTER — Encounter (HOSPITAL_COMMUNITY): Payer: Self-pay

## 2020-07-06 ENCOUNTER — Other Ambulatory Visit: Payer: Self-pay

## 2020-07-06 DIAGNOSIS — Z85828 Personal history of other malignant neoplasm of skin: Secondary | ICD-10-CM | POA: Insufficient documentation

## 2020-07-06 DIAGNOSIS — Z951 Presence of aortocoronary bypass graft: Secondary | ICD-10-CM | POA: Diagnosis not present

## 2020-07-06 DIAGNOSIS — Z7984 Long term (current) use of oral hypoglycemic drugs: Secondary | ICD-10-CM | POA: Insufficient documentation

## 2020-07-06 DIAGNOSIS — Z8249 Family history of ischemic heart disease and other diseases of the circulatory system: Secondary | ICD-10-CM | POA: Diagnosis not present

## 2020-07-06 DIAGNOSIS — Z8719 Personal history of other diseases of the digestive system: Secondary | ICD-10-CM | POA: Diagnosis not present

## 2020-07-06 DIAGNOSIS — C8330 Diffuse large B-cell lymphoma, unspecified site: Secondary | ICD-10-CM

## 2020-07-06 DIAGNOSIS — Z87891 Personal history of nicotine dependence: Secondary | ICD-10-CM | POA: Insufficient documentation

## 2020-07-06 DIAGNOSIS — I2510 Atherosclerotic heart disease of native coronary artery without angina pectoris: Secondary | ICD-10-CM | POA: Diagnosis not present

## 2020-07-06 DIAGNOSIS — I428 Other cardiomyopathies: Secondary | ICD-10-CM | POA: Insufficient documentation

## 2020-07-06 DIAGNOSIS — E785 Hyperlipidemia, unspecified: Secondary | ICD-10-CM | POA: Diagnosis not present

## 2020-07-06 DIAGNOSIS — Z452 Encounter for adjustment and management of vascular access device: Secondary | ICD-10-CM | POA: Diagnosis not present

## 2020-07-06 DIAGNOSIS — Z9079 Acquired absence of other genital organ(s): Secondary | ICD-10-CM | POA: Diagnosis not present

## 2020-07-06 DIAGNOSIS — I110 Hypertensive heart disease with heart failure: Secondary | ICD-10-CM | POA: Insufficient documentation

## 2020-07-06 DIAGNOSIS — Z79899 Other long term (current) drug therapy: Secondary | ICD-10-CM | POA: Diagnosis not present

## 2020-07-06 DIAGNOSIS — Z7982 Long term (current) use of aspirin: Secondary | ICD-10-CM | POA: Diagnosis not present

## 2020-07-06 DIAGNOSIS — E119 Type 2 diabetes mellitus without complications: Secondary | ICD-10-CM | POA: Diagnosis not present

## 2020-07-06 DIAGNOSIS — I509 Heart failure, unspecified: Secondary | ICD-10-CM | POA: Insufficient documentation

## 2020-07-06 DIAGNOSIS — Z87442 Personal history of urinary calculi: Secondary | ICD-10-CM | POA: Insufficient documentation

## 2020-07-06 HISTORY — PX: IR IMAGING GUIDED PORT INSERTION: IMG5740

## 2020-07-06 LAB — GLUCOSE, CAPILLARY: Glucose-Capillary: 116 mg/dL — ABNORMAL HIGH (ref 70–99)

## 2020-07-06 IMAGING — XA IR IMAGING GUIDED PORT INSERTION
2 series · 3 of 3 positions shown · non-contrast
Comparison: none

INDICATION: Large B-cell lymphoma

[Series 1: ir fluoro/shunt/fist · 2 of 2 slices shown]
[im 1/2]
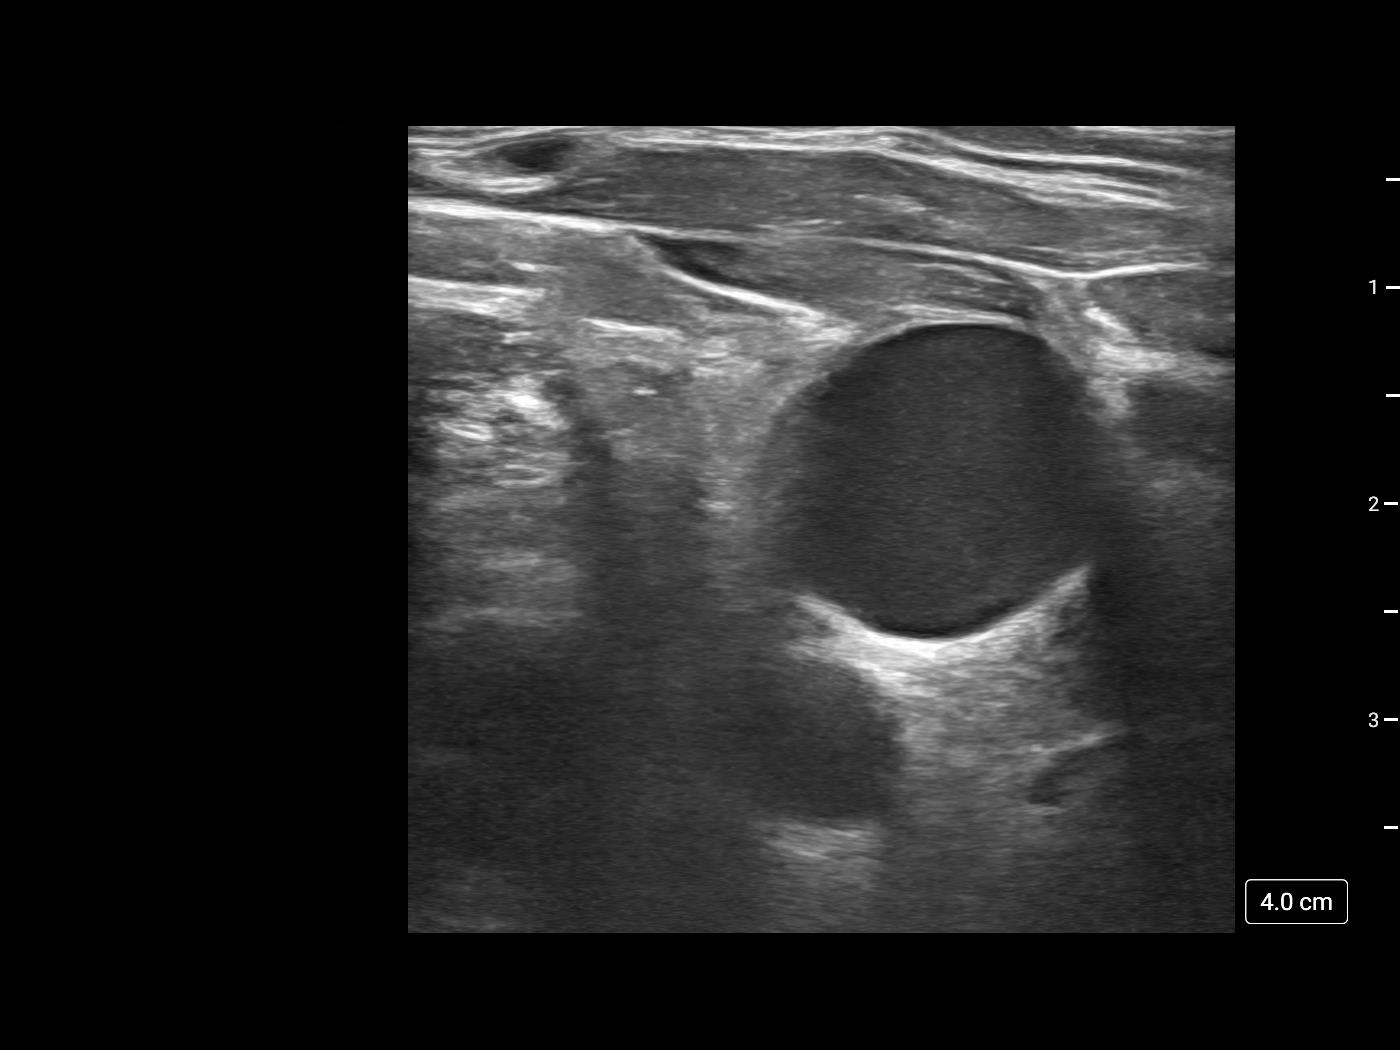
[im 2/2]
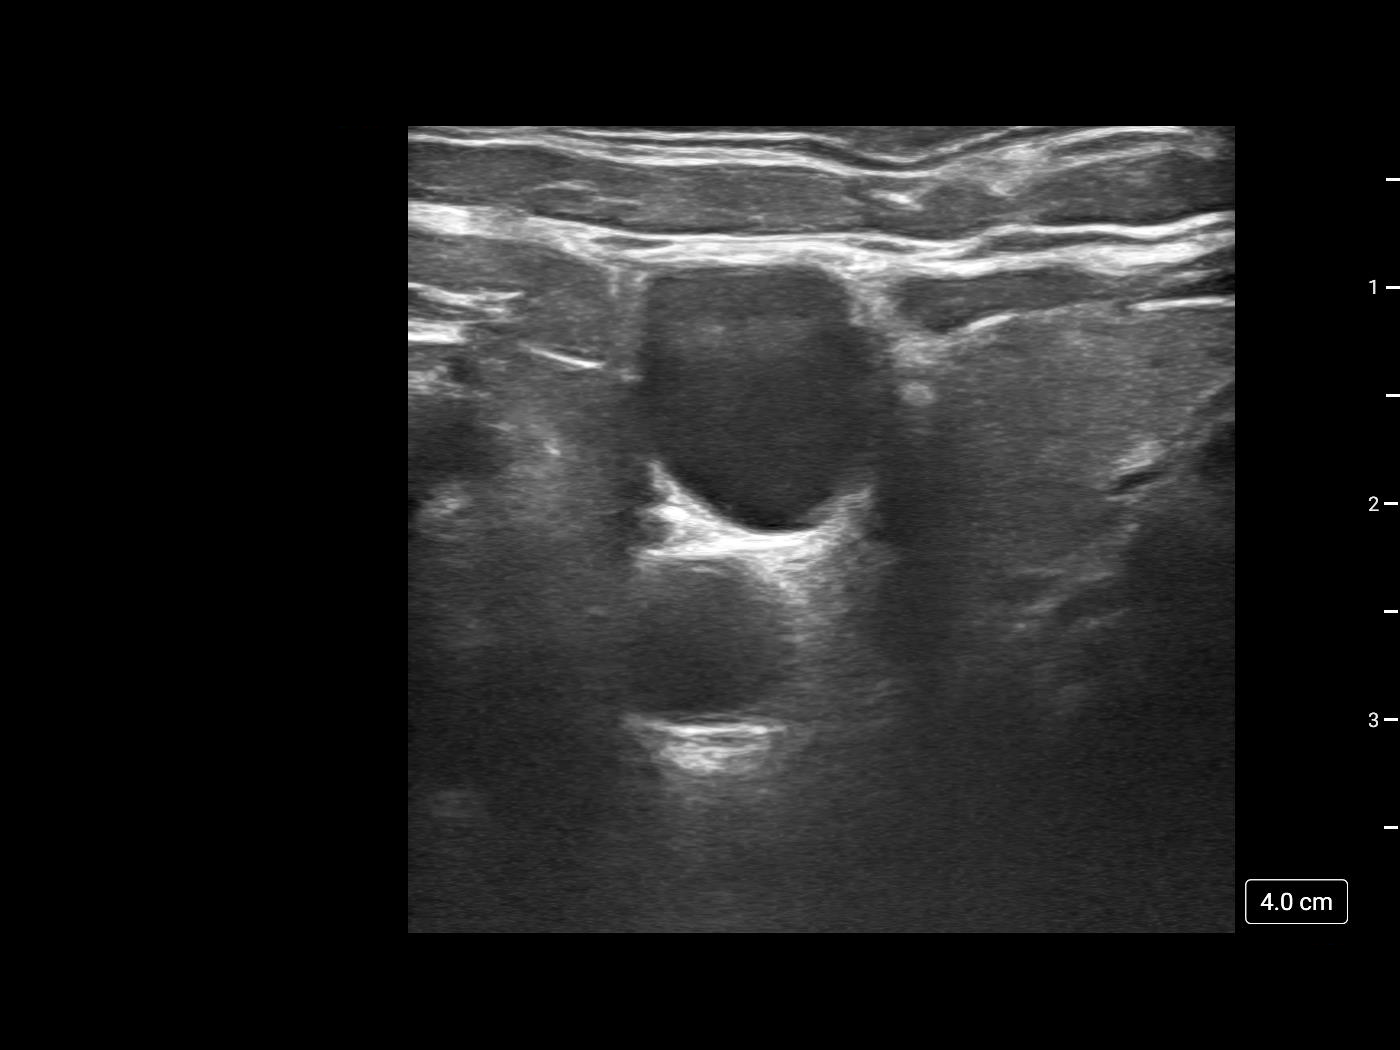

[Series 300: ir imaging guided port insertion · 1 of 1 slices shown]
[im 1/1]
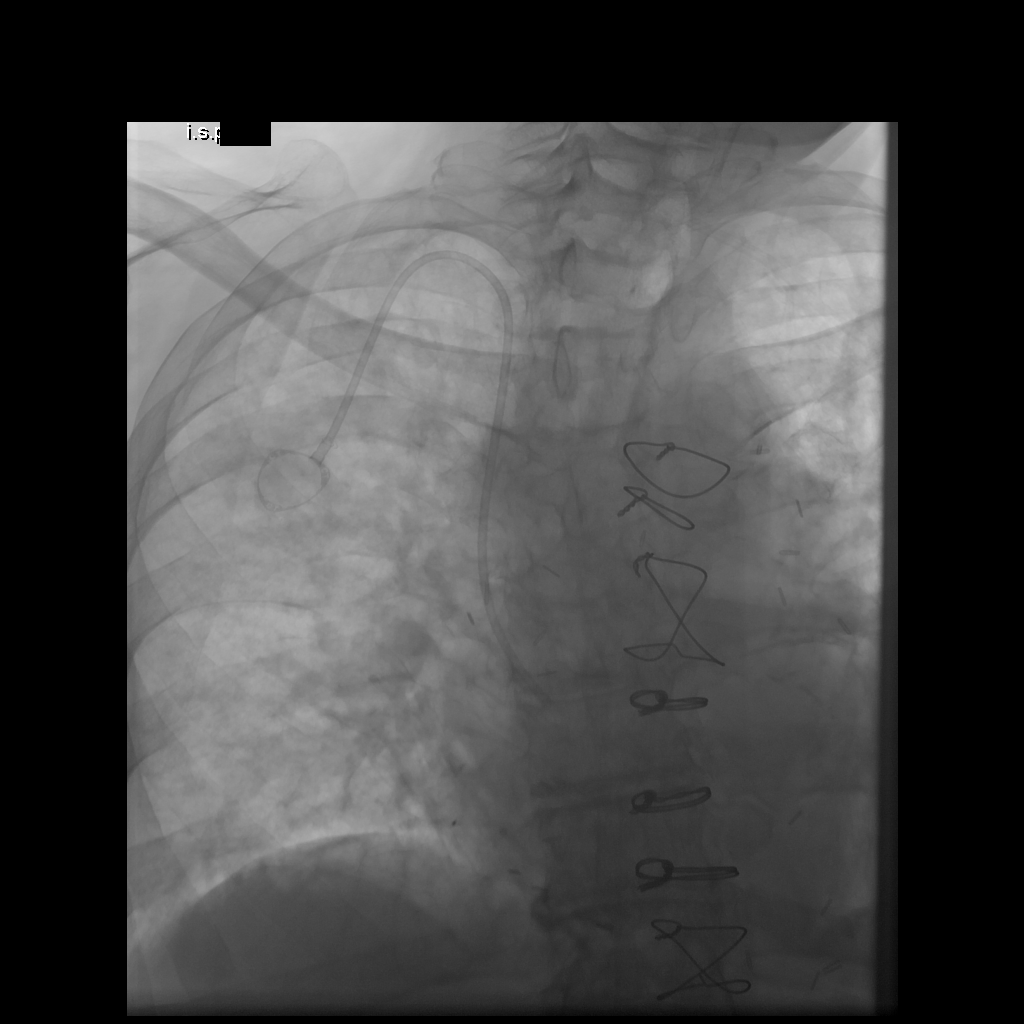

[3 of 3 positions shown; findings below may reference images not displayed]

EXAM:
IMPLANTED PORT A CATH PLACEMENT WITH ULTRASOUND AND FLUOROSCOPIC
GUIDANCE

MEDICATIONS:
None

ANESTHESIA/SEDATION:
Moderate (conscious) sedation was employed during this procedure. A
total of Versed 2 mg and Fentanyl 100 mcg was administered
intravenously.

Moderate Sedation Time: 18 minutes. The patient's level of
consciousness and vital signs were monitored continuously by
radiology nursing throughout the procedure under my direct
supervision.

FLUOROSCOPY TIME:  0 minutes, 12 seconds (7 mGy)

COMPLICATIONS:
None immediate.

PROCEDURE:
The procedure, risks, benefits, and alternatives were explained to
the patient. Questions regarding the procedure were encouraged and
answered. The patient understands and consents to the procedure.

A timeout was performed prior to the initiation of the procedure.

Patient positioned supine on the angiography table.

Right neck and anterior upper chest prepped and draped in the usual
sterile fashion. All elements of maximal sterile barrier were
utilized including, cap, mask, sterile gown, sterile gloves, large
sterile drape, hand scrubbing and 2% Chlorhexidine for skin
cleaning.

The right internal jugular vein was evaluated with ultrasound and
shown to be patent. A permanent ultrasound image was obtained and
placed in the patient's medical record. Local anesthesia was
provided with 1% lidocaine with epinephrine.

Using sterile gel and a sterile probe cover, the right internal
jugular vein was entered with a 21 ga needle during real time
ultrasound guidance.

0.018 inch guidewire placed and 21 ga needle exchanged for
transitional dilator set. Utilizing fluoroscopy, 0.035 inch
guidewire advanced through the needle without difficulty.

Attention then turned to the right anterior upper chest. Following
local lidocaine administration, a port pocket was created. The
catheter was connected to the port and brought from the pocket to
the venotomy site through a subcutaneous tunnel.

The catheter was cut to size and inserted through the peel-away
sheath. The catheter tip was positioned at the cavoatrial junction
using fluoroscopic guidance.

The port aspirated and flushed well. The port pocket was closed with
deep and superficial absorbable suture. The port pocket incision and
venotomy sites were also sealed with Dermabond.
IMPRESSION: Successful placement of a right internal jugular approach power
injectable Port-A-Cath. The catheter is ready for immediate use.

## 2020-07-06 MED ORDER — HEPARIN SOD (PORK) LOCK FLUSH 100 UNIT/ML IV SOLN
INTRAVENOUS | Status: AC
  Filled 2020-07-06: qty 5

## 2020-07-06 MED ORDER — HEPARIN SOD (PORK) LOCK FLUSH 100 UNIT/ML IV SOLN
INTRAVENOUS | Status: AC | PRN
  Administered 2020-07-06: 500 [IU] via INTRAVENOUS

## 2020-07-06 MED ORDER — MIDAZOLAM HCL 2 MG/2ML IJ SOLN
INTRAMUSCULAR | Status: AC
  Filled 2020-07-06: qty 2

## 2020-07-06 MED ORDER — SODIUM CHLORIDE 0.9 % IV SOLN: INTRAVENOUS | Status: DC

## 2020-07-06 MED ORDER — MIDAZOLAM HCL 2 MG/2ML IJ SOLN
INTRAMUSCULAR | Status: AC | PRN
  Administered 2020-07-06 (×2): 1 mg via INTRAVENOUS

## 2020-07-06 MED ORDER — LIDOCAINE-EPINEPHRINE 1 %-1:100000 IJ SOLN
INTRAMUSCULAR | Status: AC
  Filled 2020-07-06: qty 1

## 2020-07-06 MED ORDER — FENTANYL CITRATE (PF) 100 MCG/2ML IJ SOLN
INTRAMUSCULAR | Status: AC
  Filled 2020-07-06: qty 2

## 2020-07-06 MED ORDER — LIDOCAINE-EPINEPHRINE 1 %-1:100000 IJ SOLN
INTRAMUSCULAR | Status: AC | PRN
  Administered 2020-07-06: 20 mL

## 2020-07-06 MED ORDER — FENTANYL CITRATE (PF) 100 MCG/2ML IJ SOLN
INTRAMUSCULAR | Status: AC | PRN
  Administered 2020-07-06 (×2): 50 ug via INTRAVENOUS

## 2020-07-06 NOTE — Discharge Instructions (Addendum)
No change to home medications    Interventional radiology phone numbers [phone] After hours [phone]    You have skin glue (dermabond) over your new port. Do not use the lidocaine cream (EMLA cream) over the skin glue until it has healed. The petroleum in the lidocaine cream will dissolve the skin glue resulting in an infection of your new port. Use ice in a zip lock bag for 1-2 minutes over your new port before the cancer center nurses access your port.   Implanted Port Insertion, Care After This sheet gives you information about how to care for yourself after your procedure. Your health care provider may also give you more specific instructions. If you have problems or questions, contact your health care provider. What can I expect after the procedure? After the procedure, it is common to have: Discomfort at the port insertion site. Bruising on the skin over the port. This should improve over 3-4 days. Follow these instructions at home: [facility] care After your port is placed, you will get a manufacturer's information card. The card has information about your port. Keep this card with you at all times. Take care of the port as told by your health care provider. Ask your health care provider if you or a family member can get training for taking care of the port at home. A home health care nurse may also take care of the port. Make sure to remember what type of port you have. Incision care Follow instructions from your health care provider about how to take care of your port insertion site. Make sure you: Wash your hands with soap and water before and after you change your bandage (dressing). If soap and water are not available, use hand sanitizer. Change your dressing as told by your health care provider. Leave skin glue in place. These skin closures may need to stay in place for 2 weeks or longer.  Check your port insertion site every day for signs of infection. Check  for: Redness, swelling, or pain. Fluid or blood. Warmth. Pus or a bad smell.      Activity Return to your normal activities as told by your health care provider. Ask your health care provider what activities are safe for you. Do not lift anything that is heavier than 10 lb (4.5 kg), or the limit that you are told, until your health care provider says that it is safe. General instructions Take over-the-counter and prescription medicines only as told by your health care provider. Do not take baths, swim, or use a hot tub until your health care provider approves.You may remove your dressing tomorrow and shower 24 hours after your procedure. Do not drive for 24 hours if you were given a sedative during your procedure. Wear a medical alert bracelet in case of an emergency. This will tell any health care providers that you have a port. Keep all follow-up visits as told by your health care provider. This is important. Contact a health care provider if: You cannot flush your port with saline as directed, or you cannot draw blood from the port. You have a fever or chills. You have redness, swelling, or pain around your port insertion site. You have fluid or blood coming from your port insertion site. Your port insertion site feels warm to the touch. You have pus or a bad smell coming from the port insertion site. Get help right away if: You have chest pain or shortness of breath. You have bleeding from your port  that you cannot control. Summary Take care of the port as told by your health care provider. Keep the manufacturer's information card with you at all times. Change your dressing as told by your health care provider. Contact a health care provider if you have a fever or chills or if you have redness, swelling, or pain around your port insertion site. Keep all follow-up visits as told by your health care provider. This information is not intended to replace advice given to you by your  health care provider. Make sure you discuss any questions you have with your health care provider. Document Revised: 08/08/2017 Document Reviewed: 08/08/2017 Elsevier Patient Education  2021 [place].    Moderate Conscious Sedation, Adult, Care After This sheet gives you information about how to care for yourself after your procedure. Your health care provider may also give you more specific instructions. If you have problems or questions, contact your health care provider. What can I expect after the procedure? After the procedure, it is common to have: Sleepiness for several hours. Impaired judgment for several hours. Difficulty with balance. Vomiting if you eat too soon. Follow these instructions at home: For the time period you were told by your health care provider: Rest. Do not participate in activities where you could fall or become injured. Do not drive or use machinery. Do not drink alcohol. Do not take sleeping pills or medicines that cause drowsiness. Do not make important decisions or sign legal documents. Do not take care of children on your own.      Eating and drinking Follow the diet recommended by your health care provider. Drink enough fluid to keep your urine pale yellow. If you vomit: Drink water, juice, or soup when you can drink without vomiting. Make sure you have little or no nausea before eating solid foods.   General instructions Take over-the-counter and prescription medicines only as told by your health care provider. Have a responsible adult stay with you for the time you are told. It is important to have someone help care for you until you are awake and alert. Do not smoke. Keep all follow-up visits as told by your health care provider. This is important. Contact a health care provider if: You are still sleepy or having trouble with balance after 24 hours. You feel light-headed. You keep feeling nauseous or you keep vomiting. You develop a  rash. You have a fever. You have redness or swelling around the IV site. Get help right away if: You have trouble breathing. You have new-onset confusion at home. Summary After the procedure, it is common to feel sleepy, have impaired judgment, or feel nauseous if you eat too soon. Rest after you get home. Know the things you should not do after the procedure. Follow the diet recommended by your health care provider and drink enough fluid to keep your urine pale yellow. Get help right away if you have trouble breathing or new-onset confusion at home. This information is not intended to replace advice given to you by your health care provider. Make sure you discuss any questions you have with your health care provider. Document Revised: 05/10/2019 Document Reviewed: 12/06/2018 Elsevier Patient Education  2021 [employer].

## 2020-07-06 NOTE — H&P (Signed)
Chief Complaint: Patient was seen in consultation today for lymphoma/Port-a-cath placement.  Referring Physician(s): Brunetta Genera (oncology)  Supervising Physician: Mir, Sharen Heck  Patient Status: [facility] - Out-pt  History of Present Illness: Roy [name] is a 75 y.o. male with a past medical history of hypertension, hyperlipidemia, CAD s/p CABG x4 03/2020, HF, nonischemic cardiomyopathy, syncope, GERD, hiatal hernia, nephrolithiasis, diabetes mellitus type II, large B-cell lymphoma, and arthritis. He was unfortunately diagnosed with large B-cell lymphoma (left-sided primarily testicular) in early 2022. His cancer is managed by Dr. Irene Limbo. He has tentative plans to begin systemic chemotherapy as management, and is in need of durable IV access for administration.  IR consulted by Dr. Irene Limbo for possible image-guided Port-a-cath placement. Patient awake and alert sitting in bed with no complaints at this time. Denies fever, chills, chest pain, dyspnea, abdominal pain, or headache.   Past Medical History:  Diagnosis Date   Allergic rhinitis    Arthritis    wrists   Cardiomyopathy, nonischemic [facility])    followed by cardiology--- dr [name]---  2016 ef 45-50% ,  2016 nuclear ef 37%,  2017 per echo ef 50-55%   CHF (congestive heart failure), NYHA class III ([place]) 03/21/2020   Coronary artery disease    GERD (gastroesophageal reflux disease)    Hiatal hernia    History of kidney stones    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;   01/ 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   History of syncope (03-06-2020 pt stated has not had sycopal episode in few years, stated it seems to happen in extreme hot conditions)   cardiologist--- dr [name]--- dx recurrent syncope;  nuclear study 11-03-2014 intermediate risk w/ no ischemia, apical hypokinesis, nuclear ef 37%;  event monitor-- 12-21-2015 SB/ ST  no pauses/ arrythmia's;  echo 08-03-2015 ef 50-55%   Hyperlipidemia     Hypertension    followed by pcp   Hypovitaminosis D    Mass of left testicle    Nocturia    Plantar fasciitis    Presence of surgical incision    01/ 2022  moh's w/ skin graft of nose, per pt still healing and wear bandage daily   Type 2 diabetes mellitus ([place])    pt is adament that he is not and have been told he is a diabetic but a borderline;  followed by pcp, in pcp note states DM2 and takes 2 meds daily   Wears glasses    Wears hearing aid in both ears     Past Surgical History:  Procedure Laterality Date   COLONOSCOPY  last one 01-30-2017   CORONARY ARTERY BYPASS GRAFT N/A 03/25/2020   Procedure: CORONARY ARTERY BYPASS GRAFTING (CABG), ON PUMP, TIMES FOUR, USING BILATERAL INTERNAL MAMMARY ARTERIES AND LEFT RADIAL ARTERY;  Surgeon: [name], MD;  Location: [place];  Service: Open Heart Surgery;  Laterality: N/A;   LOW ANTERIOR RESECTION RECTUM W/ COLOPROCTOSTOMY  05/2003   MOHS SURGERY  01/2020   nose w/ graft   ORCHIECTOMY Left 03/11/2020   Procedure: [place];  Surgeon: [name], MD;  Location: [facility];  Service: Urology;  Laterality: Left;  ONLY NEEDS 60 MIN   RADIAL ARTERY HARVEST Left 03/25/2020   Procedure: LEFT RADIAL ARTERY HARVEST;  Surgeon: [name], MD;  Location: [place];  Service: Open Heart Surgery;  Laterality: Left;   RIGHT/LEFT HEART CATH AND CORONARY ANGIOGRAPHY N/A 03/23/2020  Procedure: RIGHT/LEFT HEART CATH AND CORONARY ANGIOGRAPHY;  Surgeon: [name], MD;  Location: [place] [facility];  Service: Cardiovascular;  Laterality: N/A;   SHOULDER SURGERY Right 1992; 07/ 2021   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  Medications: Prior to Admission medications   Medication Sig Start Date End Date Taking? Authorizing Provider  acetaminophen (TYLENOL) 325 MG tablet Take 2 tablets (650 mg total) by mouth every 4 (four) hours as needed for headache or mild pain. 04/01/20   Barrett, Erin R, PA-C  Apoaequorin (PREVAGEN PO) Take 1 tablet by mouth at bedtime.    [provider]  aspirin EC 81 MG tablet Take 81 mg by mouth daily. Swallow whole.    [provider]  atorvastatin (LIPITOR) 40 MG tablet Take 1 tablet (40 mg total) by mouth daily. 05/27/20 08/25/20  Geralynn Rile, MD  cholecalciferol (VITAMIN D) 1000 UNITS tablet Take 1,000 Units by mouth See admin instructions. Mon-friday    [provider]  empagliflozin (JARDIANCE) 10 MG TABS tablet Take 1 tablet (10 mg total) by mouth daily before breakfast. 05/27/20   O'Neal, [name], MD  furosemide (LASIX) 80 MG tablet Take 0.5 tablets (40 mg total) by mouth daily. 06/16/20   O'NealCassie Freer, MD  glucosamine-chondroitin 500-400 MG tablet Take 1 tablet by mouth. 5 times weekly    [provider]  metFORMIN (GLUCOPHAGE) 1000 MG tablet Take 1,000 mg by mouth 2 (two) times daily with a meal.     [provider]  metoprolol (TOPROL XL) 200 MG 24 hr tablet Take 1 tablet (200 mg total) by mouth daily. 06/11/20   O'NealCassie Freer, MD  Multiple Vitamin (MULTIVITAMIN WITH MINERALS) TABS tablet Take 1 tablet by mouth. 5 times weekly    [provider]  omeprazole (PRILOSEC) 20 MG capsule Take 20 mg by mouth daily.    [provider]  sacubitril-valsartan (ENTRESTO) 24-26 MG Take 1 tablet by mouth 2 (two) times daily. 06/11/20   O'NealCassie Freer, MD  spironolactone (ALDACTONE) 25 MG tablet Take 0.5 tablets (12.5 mg total) by mouth daily. 05/27/20   O'NealCassie Freer, MD   tamsulosin (FLOMAX) 0.4 MG CAPS capsule Take 0.4 mg by mouth at bedtime.    [provider]  vitamin B-12 (CYANOCOBALAMIN) 1000 MCG tablet Take 1,000 mcg by mouth. 5 times a week    [provider]     Family History  Problem Relation Age of Onset   Heart attack Mother    Stroke Father    Colon cancer Neg Hx    Esophageal cancer Neg Hx    Pancreatic cancer Neg Hx    Rectal cancer Neg Hx    Stomach cancer Neg Hx    Colon polyps Neg Hx     Social History   Socioeconomic History   Marital status: Married    Spouse name: Not on file   Number of children: Not on file   Years of education: Not on file   Highest education level: Not on file  Occupational History   Not  on file  Tobacco Use   Smoking status: Former    Pack years: 0.00    Types: Pipe    Quit date: 11/29/1976    Years since quitting: 43.6   Smokeless tobacco: Never  Vaping Use   Vaping Use: Never used  Substance and Sexual Activity   Alcohol use: Yes    Alcohol/week: 0.0 standard drinks    Comment: Occasional drink    Drug use: Never   Sexual activity: Not on file  Other Topics Concern   Not on file  Social History Narrative   Not on file   Social Determinants of Health   Financial Resource Strain: Not on file  Food Insecurity: Not on file  Transportation Needs: Not on file  Physical Activity: Not on file  Stress: Not on file  Social Connections: Not on file     Review of Systems: A 12 point ROS discussed and pertinent positives are indicated in the HPI above.  All other systems are negative.  Review of Systems  Constitutional:  Negative for chills and fever.  Respiratory:  Negative for shortness of breath and wheezing.   Cardiovascular:  Negative for chest pain and palpitations.  Gastrointestinal:  Negative for abdominal pain.  Neurological:  Negative for headaches.  Psychiatric/Behavioral:  Negative for confusion.    Vital Signs: There were no vitals taken for this  visit.  Physical Exam Vitals and nursing note reviewed.  Constitutional:      General: He is not in acute distress. Cardiovascular:     Rate and Rhythm: Normal rate and regular rhythm.     Heart sounds: Normal heart sounds. No murmur heard. Pulmonary:     Effort: Pulmonary effort is normal. No respiratory distress.     Breath sounds: Normal breath sounds. No wheezing.  Skin:    General: Skin is warm and dry.  Neurological:     Mental Status: He is alert and oriented to person, place, and time.     MD Evaluation Airway: WNL Heart: WNL Abdomen: WNL Chest/ Lungs: WNL ASA  Classification: 3 Mallampati/Airway Score: Two   Imaging: No results found.  Labs:  CBC: Recent Labs    03/30/20 0419 03/31/20 0156 04/23/20 0000 04/27/20 1359  WBC 9.1 10.6* 10.0 8.3  HGB 8.2* 9.3* 10.4* 10.7*  HCT 24.8* 28.2* 32.8* 33.8*  PLT 214 312 346 333    COAGS: Recent Labs    03/25/20 1500  INR 1.4*  APTT 32    BMP: Recent Labs    03/29/20 0348 03/30/20 0419 04/01/20 0024 04/23/20 0000 04/27/20 1359 06/11/20 1445  NA 136 139 133* 140 142 138  K 3.3* 2.9* 4.6 4.4 4.1 3.7  CL 105 113* 103 105 102 99  CO2 23 19* 20* 25 25 21   GLUCOSE 79 57* 117* 145* 124* 197*  BUN 14 13 10 15 15 23   CALCIUM 7.5* 6.2* 7.9* 8.6 8.2* 8.2*  CREATININE 0.83 0.68 1.17 1.17 1.13 1.43*  GFRNONAA >60 >60 >60  --  >60  --     LIVER FUNCTION TESTS: Recent Labs    03/21/20 0037 04/27/20 1359  BILITOT 0.8 0.6  AST 21 12*  ALT 12 12  ALKPHOS 59 81  PROT 6.4* 7.1  ALBUMIN 3.4* 3.3*     Assessment and Plan:  Lymphoma (large B-cell) with tentative plans to begin systemic chemotherapy as management, in need of durable IV access for administration. Plan for image-guided Port-a-cath today in IR. Patient is NPO. Afebrile.  Risks  and benefits of image-guided Port-a-catheter placement were discussed with the patient including, but not limited to bleeding, infection, pneumothorax, or fibrin  sheath development and need for additional procedures. All of the patient's questions were answered, patient is agreeable to proceed. Consent signed and in chart.   Thank you for this interesting consult.  I greatly enjoyed meeting Roy [name] and look forward to participating in their care.  A copy of this report was sent to the requesting provider on this date.  Electronically Signed: Earley Abide, PA-C 07/06/2020, 2:17 PM   I spent a total of 30 Minutes in face to face in clinical consultation, greater than 50% of which was counseling/coordinating care for lymphoma/Port-a-cath placement.

## 2020-07-06 NOTE — Procedures (Signed)
Interventional Radiology Procedure Note  Procedure: Chest port  Indication: Lymphoma  Findings: Please refer to procedural dictation for full description.  Complications: None  EBL: < 10 mL  Delmi Fulfer, MD [phone]   

## 2020-07-06 NOTE — Progress Notes (Signed)
Pharmacist Chemotherapy Monitoring - Initial Assessment    Anticipated start date: 07/13/20   Regimen:  Are orders appropriate based on the patient's diagnosis, regimen, and cycle? Yes Does the plan date match the patient's scheduled date? Yes Is the sequencing of drugs appropriate? yes Are the premedications appropriate for the patient's regimen? Yes Prior Authorization for treatment is: Pending If applicable, is the correct biosimilar selected based on the patient's insurance? yes  Organ Function and Labs: Are dose adjustments needed based on the patient's renal function, hepatic function, or hematologic function? Yes Are appropriate labs ordered prior to the start of patient's treatment? Yes Other organ system assessment, if indicated: N/A The following baseline labs, if indicated, have been ordered: hep b   Dose Assessment: Are the drug doses appropriate? Yes Are the following correct: Drug concentrations Yes IV fluid compatible with drug Yes Administration routes Yes Timing of therapy Yes If applicable, does the patient have documented access for treatment and/or plans for port-a-cath placement? yes If applicable, have lifetime cumulative doses been properly documented and assessed? yes Lifetime Dose Tracking  No doses have been documented on this patient for the following tracked chemicals: Doxorubicin, Epirubicin, Idarubicin, Daunorubicin, Mitoxantrone, Bleomycin, Oxaliplatin, Carboplatin, Liposomal Doxorubicin    Toxicity Monitoring/Prevention: The patient has the following take home antiemetics prescribed: Ondansetron and Lorazepam The patient has the following take home medications prescribed: N/A Medication allergies and previous infusion related reactions, if applicable, have been reviewed and addressed.no The patient's current medication list has been assessed for drug-drug interactions with their chemotherapy regimen. no significant drug-drug interactions were  identified on review.  Order Review: Are the treatment plan orders signed? Yes Is the patient scheduled to see a provider prior to their treatment? No  I verify that I have reviewed each item in the above checklist and answered each question accordingly.  Larene Beach, RPH, 07/06/2020  11:19 AM

## 2020-07-07 ENCOUNTER — Telehealth: Payer: Self-pay

## 2020-07-07 NOTE — Telephone Encounter (Signed)
Returned call to pt per pt's request. Pt had some scheduling questions and injection questions. Gave pt requested information and pt verbalized understanding.

## 2020-07-08 ENCOUNTER — Inpatient Hospital Stay: Payer: Medicare HMO

## 2020-07-08 ENCOUNTER — Other Ambulatory Visit: Payer: Self-pay

## 2020-07-08 ENCOUNTER — Encounter: Payer: Self-pay | Admitting: *Deleted

## 2020-07-08 DIAGNOSIS — Z951 Presence of aortocoronary bypass graft: Secondary | ICD-10-CM | POA: Diagnosis not present

## 2020-07-08 NOTE — Progress Notes (Signed)
Cardiac Individual Treatment Plan  Patient Details  Name: Roy [name] MRN: [id number] Date of Birth: March 23, 1945 Referring Provider:   Flowsheet Row Cardiac Rehab from 05/04/2020 in [facility] Cardiac and Pulmonary Rehab  Referring Provider [name] MD       Initial Encounter Date:  Flowsheet Row Cardiac Rehab from 05/04/2020 in [facility] Cardiac and Pulmonary Rehab  Date 05/04/20       Visit Diagnosis: S/P CABG x 4  Patient's Home Medications on Admission:  Current Outpatient Medications:    acetaminophen (TYLENOL) 325 MG tablet, Take 2 tablets (650 mg total) by mouth every 4 (four) hours as needed for headache or mild pain., Disp: , Rfl:    Apoaequorin (PREVAGEN PO), Take 1 tablet by mouth at bedtime., Disp: , Rfl:    aspirin EC 81 MG tablet, Take 81 mg by mouth daily. Swallow whole., Disp: , Rfl:    atorvastatin (LIPITOR) 40 MG tablet, Take 1 tablet (40 mg total) by mouth daily., Disp: 90 tablet, Rfl: 3   cholecalciferol (VITAMIN D) 1000 UNITS tablet, Take 1,000 Units by mouth See admin instructions. Mon-friday, Disp: , Rfl:    empagliflozin (JARDIANCE) 10 MG TABS tablet, Take 1 tablet (10 mg total) by mouth daily before breakfast., Disp: 90 tablet, Rfl: 1   furosemide (LASIX) 80 MG tablet, Take 0.5 tablets (40 mg total) by mouth daily., Disp: 30 tablet, Rfl: 11   glucosamine-chondroitin 500-400 MG tablet, Take 1 tablet by mouth. 5 times weekly, Disp: , Rfl:    metFORMIN (GLUCOPHAGE) 1000 MG tablet, Take 1,000 mg by mouth 2 (two) times daily with a meal. , Disp: , Rfl:    metoprolol (TOPROL XL) 200 MG 24 hr tablet, Take 1 tablet (200 mg total) by mouth daily., Disp: 90 tablet, Rfl: 3   Multiple Vitamin (MULTIVITAMIN WITH MINERALS) TABS tablet, Take 1 tablet by mouth. 5 times weekly, Disp: , Rfl:    omeprazole (PRILOSEC) 20 MG capsule, Take 20 mg by mouth daily., Disp: , Rfl:    sacubitril-valsartan (ENTRESTO) 24-26 MG, Take 1 tablet by mouth 2 (two) times daily., Disp: 60 tablet, Rfl:  3   spironolactone (ALDACTONE) 25 MG tablet, Take 0.5 tablets (12.5 mg total) by mouth daily., Disp: 60 tablet, Rfl: 1   tamsulosin (FLOMAX) 0.4 MG CAPS capsule, Take 0.4 mg by mouth at bedtime., Disp: , Rfl:    vitamin B-12 (CYANOCOBALAMIN) 1000 MCG tablet, Take 1,000 mcg by mouth. 5 times a week, Disp: , Rfl:   Current Facility-Administered Medications:    potassium chloride (KLOR-CON) CR tablet 20 mEq, 20 mEq, Oral, BID, Atkins, Glenice Bow, MD  Past Medical History: Past Medical History:  Diagnosis Date   Allergic rhinitis    Arthritis    wrists   Cardiomyopathy, nonischemic ([place])    followed by cardiology--- dr [name]---  2016 ef 45-50% ,  2016 nuclear ef 37%,  2017 per echo ef 50-55%   CHF (congestive heart failure), NYHA class III ([place]) 03/21/2020   Coronary artery disease    GERD (gastroesophageal reflux disease)    Hiatal hernia    History of kidney stones    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;   01/ 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   History of syncope (03-06-2020 pt stated has not had sycopal episode in few years, stated it seems to happen in extreme hot conditions)   cardiologist--- dr [name]--- dx recurrent syncope;  nuclear study 11-03-2014  intermediate risk w/ no ischemia, apical hypokinesis, nuclear ef 37%;  event monitor-- 12-21-2015 SB/ ST  no pauses/ arrythmia's;  echo 08-03-2015 ef 50-55%   Hyperlipidemia    Hypertension    followed by pcp   Hypovitaminosis D    Mass of left testicle    Nocturia    Plantar fasciitis    Presence of surgical incision    01/ 2022  moh's w/ skin graft of nose, per pt still healing and wear bandage daily   Type 2 diabetes mellitus ([place])    pt is adament that he is not and have been told he is a diabetic but a borderline;  followed by pcp, in pcp note states DM2 and takes 2 meds daily   Wears glasses    Wears hearing aid in both ears     Tobacco Use: Social History   Tobacco Use   Smoking Status Former   Pack years: 0.00   Types: Pipe   Quit date: 11/29/1976   Years since quitting: 43.6  Smokeless Tobacco Never    Labs: Recent Review Flowsheet Data     Labs for ITP Cardiac and Pulmonary Rehab Latest Ref Rng & Units 03/28/2020 03/29/2020 03/30/2020 03/30/2020 07/03/2020   Cholestrol 100 - 199 mg/dL - - - - 128   LDLCALC 0 - 99 mg/dL - - - - 65   HDL >39 mg/dL - - - - 41   Trlycerides 0 - 149 mg/dL - - - - 124   Hemoglobin A1c 4.8 - 5.6 % - - - - -   PHART 7.350 - 7.450 - - - - -   PCO2ART 32.0 - 48.0 mmHg - - - - -   HCO3 20.0 - 28.0 mmol/L - - - - -   TCO2 22 - 32 mmol/L - - - - -   ACIDBASEDEF 0.0 - 2.0 mmol/L - - - - -   O2SAT % 61.4 60.3 59.8 56.5 -        Exercise Target Goals: Exercise Program Goal: Individual exercise prescription set using results from initial 6 min walk test and THRR while considering  patient's activity barriers and safety.   Exercise Prescription Goal: Initial exercise prescription builds to 30-45 minutes a day of aerobic activity, 2-3 days per week.  Home exercise guidelines will be given to patient during program as part of exercise prescription that the participant will acknowledge.   Education: Aerobic Exercise: - Group verbal and visual presentation on the components of exercise prescription. Introduces F.I.T.T principle from ACSM for exercise prescriptions.  Reviews F.I.T.T. principles of aerobic exercise including progression. Written material given at graduation.   Education: Resistance Exercise: - Group verbal and visual presentation on the components of exercise prescription. Introduces F.I.T.T principle from ACSM for exercise prescriptions  Reviews F.I.T.T. principles of resistance exercise including progression. Written material given at graduation.    Education: Exercise & Equipment Safety: - Individual verbal instruction and demonstration of equipment use and safety with use of the equipment. Flowsheet Row Cardiac  Rehab from 06/24/2020 in [facility] Cardiac and Pulmonary Rehab  Date 04/30/20  Educator [facility]  Instruction Review Code 1- Verbalizes Understanding       Education: Exercise Physiology & General Exercise Guidelines: - Group verbal and written instruction with models to review the exercise physiology of the cardiovascular system and associated critical values. Provides general exercise guidelines with specific guidelines to those with heart or lung disease.    Education: Flexibility, Balance, Mind/Body Relaxation: - Group  verbal and visual presentation with interactive activity on the components of exercise prescription. Introduces F.I.T.T principle from ACSM for exercise prescriptions. Reviews F.I.T.T. principles of flexibility and balance exercise training including progression. Also discusses the mind body connection.  Reviews various relaxation techniques to help reduce and manage stress (i.e. Deep breathing, progressive muscle relaxation, and visualization). Balance handout provided to take home. Written material given at graduation. Flowsheet Row Cardiac Rehab from 06/24/2020 in [facility] Cardiac and Pulmonary Rehab  Date 05/27/20  Educator AS  Instruction Review Code 1- Verbalizes Understanding       Activity Barriers & Risk Stratification:  Activity Barriers & Cardiac Risk Stratification - 05/04/20 1237       Activity Barriers & Cardiac Risk Stratification   Activity Barriers Shortness of Breath;Decreased Ventricular Function;Deconditioning;Muscular Weakness;Other (comment)    Comments Right shoulder- limited ROM due to rotator cuff surgery and bicep tear    Cardiac Risk Stratification High             6 Minute Walk:  6 Minute Walk     Row Name 05/04/20 1245         6 Minute Walk   Phase Initial     Distance 1000 feet     Walk Time 6 minutes     # of Rest Breaks 0     MPH 1.89     METS 2.19     RPE 11     Perceived Dyspnea  1     VO2 Peak 7.69     Symptoms No     Resting  HR 98 bpm     Resting BP 112/68     Resting Oxygen Saturation  99 %     Exercise Oxygen Saturation  during 6 min walk 99 %     Max Ex. HR 116 bpm     Max Ex. BP 122/66     2 Minute Post BP 110/68              Oxygen Initial Assessment:   Oxygen Re-Evaluation:   Oxygen Discharge (Final Oxygen Re-Evaluation):   Initial Exercise Prescription:  Initial Exercise Prescription - 05/04/20 1200       Date of Initial Exercise RX and Referring Provider   Date 05/04/20    Referring Provider [name] MD      Treadmill   MPH 1.7    Grade 0.5    Minutes 15    METs 2.42      Recumbant Bike   Level 2    RPM 60    Watts 10    Minutes 15    METs 2.1      NuStep   Level 2    SPM 80    Minutes 15    METs 2.1      T5 Nustep   Level 1    SPM 80    Minutes 15    METs 2.1      Prescription Details   Frequency (times per week) 2    Duration Progress to 30 minutes of continuous aerobic without signs/symptoms of physical distress      Intensity   THRR 40-80% of Max Heartrate 116-134    Ratings of Perceived Exertion 11-13    Perceived Dyspnea 0-4      Progression   Progression Continue to progress workloads to maintain intensity without signs/symptoms of physical distress.      Resistance Training   Training Prescription Yes    Weight 3 lb  Reps 10-15             Perform Capillary Blood Glucose checks as needed.  Exercise Prescription Changes:   Exercise Prescription Changes     Row Name 05/04/20 1200 05/20/20 1500 06/02/20 1300 06/08/20 1400 06/17/20 0800     Response to Exercise   Blood Pressure (Admit) 112/68 116/64 104/64 -- 94/60   Blood Pressure (Exercise) 122/66 124/60 142/70 -- 110/64   Blood Pressure (Exit) 110/68 104/62 90/62 -- 98/60   Heart Rate (Admit) 98 bpm 74 bpm 100 bpm -- 93 bpm   Heart Rate (Exercise) 116 bpm 111 bpm 112 bpm -- 109 bpm   Heart Rate (Exit) 99 bpm 100 bpm 104 bpm -- 100 bpm   Oxygen Saturation (Admit) 99 % --  -- -- --   Oxygen Saturation (Exercise) 99 % -- -- -- --   Oxygen Saturation (Exit) 99 % -- -- -- --   Rating of Perceived Exertion (Exercise) _0 -- 11   Perceived Dyspnea (Exercise) 1 -- -- -- --   Symptoms none none -- -- none   Comments walk test results third full day of exercise -- -- --   Duration -- Continue with 30 min of aerobic exercise without signs/symptoms of physical distress. Continue with 30 min of aerobic exercise without signs/symptoms of physical distress. -- Continue with 30 min of aerobic exercise without signs/symptoms of physical distress.   Intensity -- THRR unchanged THRR unchanged -- THRR unchanged     Progression   Progression -- Continue to progress workloads to maintain intensity without signs/symptoms of physical distress. Continue to progress workloads to maintain intensity without signs/symptoms of physical distress. -- Continue to progress workloads to maintain intensity without signs/symptoms of physical distress.   Average METs -- 1.93 2.16 -- 2.57     Resistance Training   Training Prescription -- Yes Yes -- Yes   Weight -- 3 lb 3 lb -- 3 lb   Reps -- 10-15 10-15 -- 10-15     Interval Training   Interval Training -- No No -- No     Treadmill   MPH -- 0.7 1.1 -- 1.3   Grade -- 0 0.5 -- 0   Minutes -- 15 15 -- 15   METs -- 1.5 1.92 -- 2     Recumbant Bike   Level -- 2 1 -- 1   Watts -- -- -- -- 16   Minutes -- 15 15 -- 15   METs -- 2 2.3 -- 2.52     NuStep   Level -- 2 -- -- --   Minutes -- 15 -- -- --   METs -- 2.2 -- -- --     REL-XR   Level -- -- -- -- 1   Minutes -- -- -- -- 15   METs -- -- -- -- 3.2     T5 Nustep   Level -- 1 -- -- --   Minutes -- 15 -- -- --   METs -- 2 -- -- --     Home Exercise Plan   Plans to continue exercise at -- -- -- Home (comment)  walking, weights, staff videos Home (comment)  walking, weights, staff videos   Frequency -- -- -- Add 2 additional days to program exercise sessions. Add 2  additional days to program exercise sessions.   Initial Home Exercises Provided -- -- -- 06/08/20 06/08/20    Row Name 06/30/20 1300  Response to Exercise   Blood Pressure (Admit) 122/62       Blood Pressure (Exercise) 124/70       Blood Pressure (Exit) 122/64       Heart Rate (Admit) 92 bpm       Heart Rate (Exercise) 108 bpm       Heart Rate (Exit) 92 bpm       Rating of Perceived Exertion (Exercise) 11       Symptoms none       Duration Continue with 30 min of aerobic exercise without signs/symptoms of physical distress.       Intensity THRR unchanged               Progression     Progression Continue to progress workloads to maintain intensity without signs/symptoms of physical distress.       Average METs 2.2               Resistance Training     Training Prescription Yes       Weight 3 lb       Reps 10-15               Interval Training     Interval Training No               Treadmill     MPH 1.3       Grade 0       Minutes 15       METs 2               REL-XR     Level 1       Minutes 15               Home Exercise Plan     Plans to continue exercise at Home (comment)  walking, weights, staff videos       Frequency Add 2 additional days to program exercise sessions.       Initial Home Exercises Provided 06/08/20               Exercise Comments:   Exercise Goals and Review:   Exercise Goals     Row Name 05/04/20 1304             Exercise Goals   Increase Physical Activity Yes       Intervention Provide advice, education, support and counseling about physical activity/exercise needs.;Develop an individualized exercise prescription for aerobic and resistive training based on initial evaluation findings, risk stratification, comorbidities and participant's personal goals.       Expected Outcomes Short Term: Attend rehab on a regular basis to increase amount of physical activity.;Long Term: Exercising regularly at least 3-5 days  a week.;Long Term: Add in home exercise to make exercise part of routine and to increase amount of physical activity.       Increase Strength and Stamina Yes       Intervention Provide advice, education, support and counseling about physical activity/exercise needs.;Develop an individualized exercise prescription for aerobic and resistive training based on initial evaluation findings, risk stratification, comorbidities and participant's personal goals.       Expected Outcomes Short Term: Increase workloads from initial exercise prescription for resistance, speed, and METs.;Short Term: Perform resistance training exercises routinely during rehab and add in resistance training at home;Long Term: Improve cardiorespiratory fitness, muscular endurance and strength as measured by increased METs and functional capacity (6MWT)       Able to understand  and use rate of perceived exertion (RPE) scale Yes       Intervention Provide education and explanation on how to use RPE scale       Expected Outcomes Short Term: Able to use RPE daily in rehab to express subjective intensity level;Long Term:  Able to use RPE to guide intensity level when exercising independently       Able to understand and use Dyspnea scale Yes       Intervention Provide education and explanation on how to use Dyspnea scale       Expected Outcomes Short Term: Able to use Dyspnea scale daily in rehab to express subjective sense of shortness of breath during exertion;Long Term: Able to use Dyspnea scale to guide intensity level when exercising independently       Knowledge and understanding of Target Heart Rate Range (THRR) Yes       Intervention Provide education and explanation of THRR including how the numbers were predicted and where they are located for reference       Expected Outcomes Short Term: Able to state/look up THRR;Short Term: Able to use daily as guideline for intensity in rehab;Long Term: Able to use THRR to govern intensity when  exercising independently       Able to check pulse independently Yes       Intervention Provide education and demonstration on how to check pulse in carotid and radial arteries.;Review the importance of being able to check your own pulse for safety during independent exercise       Expected Outcomes Short Term: Able to explain why pulse checking is important during independent exercise;Long Term: Able to check pulse independently and accurately       Understanding of Exercise Prescription Yes       Intervention Provide education, explanation, and written materials on patient's individual exercise prescription       Expected Outcomes Short Term: Able to explain program exercise prescription;Long Term: Able to explain home exercise prescription to exercise independently                Exercise Goals Re-Evaluation :  Exercise Goals Re-Evaluation     Row Name 05/14/20 1407 05/20/20 1553 06/02/20 1332 06/08/20 1406 06/17/20 0757     Exercise Goal Re-Evaluation   Exercise Goals Review Increase Physical Activity;Able to understand and use rate of perceived exertion (RPE) scale;Knowledge and understanding of Target Heart Rate Range (THRR);Understanding of Exercise Prescription;Increase Strength and Stamina;Able to check pulse independently Increase Physical Activity;Increase Strength and Stamina;Understanding of Exercise Prescription Increase Physical Activity;Increase Strength and Stamina Increase Physical Activity;Increase Strength and Stamina;Understanding of Exercise Prescription Increase Physical Activity;Increase Strength and Stamina;Understanding of Exercise Prescription   Comments Reviewed RPE and dyspnea scales, THR and program prescription with pt today.  Pt voiced understanding and was given a copy of goals to take home. Spira is off to a good start in rehab.  He has completed his first three full days of exercise.  We will continue to monitor his progress. Fischler has increased to 1.1 mph on TM.   He is close to target HR range.  Staff will monitor progress. Bresee is doing well in rehab.  He is feeling like his strength and stamina are recoving.  He does not get as tired as quickly.  I he was able to take trash out last week without stopping!!  Reviewed home exercise with pt today.  Pt plans to walk and use staff videos at home for exercise.  Reviewed  THR, pulse, RPE, sign and symptoms, pulse oximetery and when to call 911 or MD.  Also discussed weather considerations and indoor options.  Pt voiced understanding. Goedken has been doing well in rehab.  He would benefit from improved attendance as his progression is lacking.  He was down to 1.3 mph on treadmill at last session.  We will continue to monitor his progress.   Expected Outcomes Short: Use RPE daily to regulate intensity. Long: Follow program prescription in THR. Short: Continue to attend rehab regularly Long: Continue to follow program prescription Short: work in [employer] range Long: contineu to build stamina Short: start to add in more walk and staff videos at home Long: Continue to build stamina Short: Improved attendance Long: Continue to improve stamina    Row Name 06/24/20 1427 06/30/20 1316           Exercise Goal Re-Evaluation   Exercise Goals Review Increase Physical Activity;Increase Strength and Stamina Increase Physical Activity;Increase Strength and Stamina      Comments Dygert is walking on days not at [facility].  He is still not lifting too heavy but does some yard work.  He has noticed he doesnt have to stop as much walking down driveway.  He would have to stop both ways before he was exercising.  He also walked in from patient parking instead of using valet. Skillin missed some sessions in May due to other health issues.  He is getting back on track with attendance.      Expected Outcomes Short: maintain consistent exercise Long: continue to buils stamina SHort: attend at least twice per week Long: improve overall stamina                Discharge Exercise Prescription (Final Exercise Prescription Changes):  Exercise Prescription Changes - 06/30/20 1300       Response to Exercise   Blood Pressure (Admit) 122/62    Blood Pressure (Exercise) 124/70    Blood Pressure (Exit) 122/64    Heart Rate (Admit) 92 bpm    Heart Rate (Exercise) 108 bpm    Heart Rate (Exit) 92 bpm    Rating of Perceived Exertion (Exercise) 11    Symptoms none    Duration Continue with 30 min of aerobic exercise without signs/symptoms of physical distress.    Intensity THRR unchanged      Progression   Progression Continue to progress workloads to maintain intensity without signs/symptoms of physical distress.    Average METs 2.2      Resistance Training   Training Prescription Yes    Weight 3 lb    Reps 10-15      Interval Training   Interval Training No      Treadmill   MPH 1.3    Grade 0    Minutes 15    METs 2      REL-XR   Level 1    Minutes 15      Home Exercise Plan   Plans to continue exercise at Home (comment)   walking, weights, staff videos   Frequency Add 2 additional days to program exercise sessions.    Initial Home Exercises Provided 06/08/20             Nutrition:  Target Goals: Understanding of nutrition guidelines, daily intake of sodium <1524m, cholesterol <2018m calories 30% from fat and 7% or less from saturated fats, daily to have 5 or more servings of fruits and vegetables.  Education: All About Nutrition: -Group instruction provided  by verbal, written material, interactive activities, discussions, models, and posters to present general guidelines for heart healthy nutrition including fat, fiber, MyPlate, the role of sodium in heart healthy nutrition, utilization of the nutrition label, and utilization of this knowledge for meal planning. Follow up email sent as well. Written material given at graduation. Flowsheet Row Cardiac Rehab from 06/24/2020 in [facility] Cardiac and Pulmonary Rehab  Date 06/03/20   Educator [facility]  Instruction Review Code 1- Verbalizes Understanding       Biometrics:  Pre Biometrics - 05/04/20 1236       Pre Biometrics   Height 6' (1.829 m)    Weight 203 lb 8 oz (92.3 kg)    BMI (Calculated) 27.59    Single Leg Stand 15.9 seconds              Nutrition Therapy Plan and Nutrition Goals:  Nutrition Therapy & Goals - 05/19/20 1002       Nutrition Therapy   Diet Heart healthy, low Na, Diabetes friendly    Protein (specify units) 95-110g (active cancer - not in treatment yet)    Fiber 30 grams    Whole Grain Foods 3 servings    Saturated Fats 12 max. grams    Fruits and Vegetables 8 servings/day    Sodium 1.5 grams      Personal Nutrition Goals   Nutrition Goal ST: Increase fruit/vegetable intake to 5 servings per day (eat a rainbow per week), limit processed meat to <2x/month, mix brown rice with white rice to see how he enjoys it. LT: meet nutritional needs going through treatment, make sure RD is part of care team    Comments He has active Cancer, but needs to talk with cardiologist regarding his heart status and cancer treatment. The oncologists say he needs to begin treatment soon. Encouraged to ensure RD is a part of his treatment team. He is honoring his hunger cues and eating until satisfied, but not uncomfortably full. He is no longer eating red meat every day. He eats mostly chicken, fish, shrimp, and pork chops. He likes to eat many green vegetables which he will steam and he enjoys fruit - 3 servings per day normally (2 fruits and a vegetable; canned fruit typically -in own juice). He uses mostly olive oil, if eating eggs will use bacon fat - eats processed meat like bacon 2x/month. Does not eat much bread, but he enjoys potatoes. A1C: 6.7. He does not take his BG at home and does not have a monitor at home. B: fruit cocktail or oatmeal or griits with dried fruit or a pad of butter; this morning he had an english muffin (regular) with strawberry  jelly, butter, and a salmon patty with coffee (2 splenda and half and half) and apple juice for his medications. S:2 graham cracker with PB and 2% milk or white bread and [place] sandwich D: pork chop with mac and cheese and fried apples and iced tea (sweet) tonight. He will occassionally have shot of liquor or wine. Drinks: water and occassionally pepsi, coke, dr pepper, 7-up. He reports liking whole wheat bread, but does not like it every day. Discussed heart healthy eating, diabetes friendly eating, and eating during cancer treatment (discussed possible barriers and increased needs).      Intervention Plan   Intervention Prescribe, educate and counsel regarding individualized specific dietary modifications aiming towards targeted core components such as weight, hypertension, lipid management, diabetes, heart failure and other comorbidities.;Nutrition handout(s) given to patient.  Expected Outcomes Short Term Goal: Understand basic principles of dietary content, such as calories, fat, sodium, cholesterol and nutrients.;Short Term Goal: A plan has been developed with personal nutrition goals set during dietitian appointment.;Long Term Goal: Adherence to prescribed nutrition plan.             Nutrition Assessments:  MEDIFICTS Score Key: ?70 Need to make dietary changes  40-70 Heart Healthy Diet ? 40 Therapeutic Level Cholesterol Diet  Flowsheet Row Cardiac Rehab from 05/04/2020 in [facility] Cardiac and Pulmonary Rehab  Picture Your Plate Total Score on Admission 62      Picture Your Plate Scores: <38 Unhealthy dietary pattern with much room for improvement. 41-50 Dietary pattern unlikely to meet recommendations for good health and room for improvement. 51-60 More healthful dietary pattern, with some room for improvement.  >60 Healthy dietary pattern, although there may be some specific behaviors that could be improved.    Nutrition Goals Re-Evaluation:  Nutrition Goals Re-Evaluation      [place] Name 06/08/20 1409 06/24/20 1430           Goals   Nutrition Goal ST: Increase fruit/vegetable intake to 5 servings per day (eat a rainbow per week), limit processed meat to <2x/month, mix brown rice with white rice to see how he enjoys it. LT: meet nutritional needs going through treatment, make sure RD is part of care team --      Comment He met with Melissa recently and has already backed off his portion sizes.  He is eating more fruit than ever before.  He is trying to get a good variety.  He is sticking with 2-21/2 meals a day.  Usually breakfast, snack, and dinner. Schiff reports eating 2 good meals and a snack each day.  He watches what he eats and not as much red meat.  He is eating fresh and frozen vegetables.  He has also increased fruit - avoiding grapefruit due to medication interaction.  He eats a meat with pasta salad and a fruit for dinner.      Expected Outcome Short: Continue twith changes Long: COnitnue to focus on healthy eating. ST/LT:  continue with current plan               Nutrition Goals Discharge (Final Nutrition Goals Re-Evaluation):  Nutrition Goals Re-Evaluation - 06/24/20 1430       Goals   Comment Whyte reports eating 2 good meals and a snack each day.  He watches what he eats and not as much red meat.  He is eating fresh and frozen vegetables.  He has also increased fruit - avoiding grapefruit due to medication interaction.  He eats a meat with pasta salad and a fruit for dinner.    Expected Outcome ST/LT:  continue with current plan             Psychosocial: Target Goals: Acknowledge presence or absence of significant depression and/or stress, maximize coping skills, provide positive support system. Participant is able to verbalize types and ability to use techniques and skills needed for reducing stress and depression.   Education: Stress, Anxiety, and Depression - Group verbal and visual presentation to define topics covered.  Reviews how body is  impacted by stress, anxiety, and depression.  Also discusses healthy ways to reduce stress and to treat/manage anxiety and depression.  Written material given at graduation.   Education: Sleep Hygiene -Provides group verbal and written instruction about how sleep can affect your health.  Define sleep hygiene, discuss  sleep cycles and impact of sleep habits. Review good sleep hygiene tips.    Initial Review & Psychosocial Screening:  Initial Psych Review & Screening - 04/30/20 0913       Initial Review   Current issues with None Identified      Family Dynamics   Good Support System? Yes    Comments He can look to his wife  for support. Hull has a positive outlook on his health.      Barriers   Psychosocial barriers to participate in program There are no identifiable barriers or psychosocial needs.;The patient should benefit from training in stress management and relaxation.      Screening Interventions   Interventions To provide support and resources with identified psychosocial needs;Provide feedback about the scores to participant;Encouraged to exercise    Expected Outcomes Short Term goal: Utilizing psychosocial counselor, staff and physician to assist with identification of specific Stressors or current issues interfering with healing process. Setting desired goal for each stressor or current issue identified.;Long Term Goal: Stressors or current issues are controlled or eliminated.;Short Term goal: Identification and review with participant of any Quality of Life or Depression concerns found by scoring the questionnaire.;Long Term goal: The participant improves quality of Life and PHQ9 Scores as seen by post scores and/or verbalization of changes             Quality of Life Scores:   Quality of Life - 05/04/20 1234       Quality of Life   Select Quality of Life      Quality of Life Scores   Health/Function Pre 20.07 %    Socioeconomic Pre 25.57 %    Psych/Spiritual Pre 30  %    Family Pre 27 %    GLOBAL Pre 24.31 %            Scores of 19 and below usually indicate a poorer quality of life in these areas.  A difference of  2-3 points is a clinically meaningful difference.  A difference of 2-3 points in the total score of the Quality of Life Index has been associated with significant improvement in overall quality of life, self-image, physical symptoms, and general health in studies assessing change in quality of life.  PHQ-9: Recent Review Flowsheet Data     Depression screen [facility] 2/9 05/04/2020   Decreased Interest 0   Down, Depressed, Hopeless 0   PHQ - 2 Score 0   Altered sleeping 2   Tired, decreased energy 1   Change in appetite 1   Feeling bad or failure about yourself  0   Trouble concentrating 0   Moving slowly or fidgety/restless 0   Suicidal thoughts 0   PHQ-9 Score 4   Difficult doing work/chores Not difficult at all      Interpretation of Total Score  Total Score Depression Severity:  1-4 = Minimal depression, 5-9 = Mild depression, 10-14 = Moderate depression, 15-19 = Moderately severe depression, 20-27 = Severe depression   Psychosocial Evaluation and Intervention:  Psychosocial Evaluation - 04/30/20 0914       Psychosocial Evaluation & Interventions   Interventions Encouraged to exercise with the program and follow exercise prescription;Stress management education;Relaxation education    Comments He can look to his wife for support. Mahany has a positive outlook on his health.    Expected Outcomes Short: Exercise regularly to support mental health and notify staff of any changes. Long: maintain mental health and well being through teaching of  rehab or prescribed medications independently.    Continue Psychosocial Services  Follow up required by staff             Psychosocial Re-Evaluation:  Psychosocial Re-Evaluation     [place] Name 06/08/20 1407 06/24/20 1432           Psychosocial Re-Evaluation   Current issues with  Current Stress Concerns --      Comments Last night was a rough sleep night.  However, he usually gets 7-9 hours.  Today, he feeling off his game and out of sorts with blurry vision upon standing. Mentally he is doing well.  Other than his health he has no major stressors.  They went to a wedding over the weekend.  He is generally a positive and happy person. Ziomek gets 7.5-9.5 hours of sleep and feels well rested.  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  He will start this in next 20 days.  They are starting with small doses because of his heart.  He feels confident in the plan his Drs have for him.      Expected Outcomes Short: Continue to keep close eye on health Long: Conitnue to stay positive. Short: continue to work with Drs on treatment plan Long: continue to exercise to help manage stress      Interventions Encouraged to attend Cardiac Rehabilitation for the exercise --      Continue Psychosocial Services  Follow up required by staff --               Psychosocial Discharge (Final Psychosocial Re-Evaluation):  Psychosocial Re-Evaluation - 06/24/20 1432       Psychosocial Re-Evaluation   Comments Maybee gets 7.5-9.5 hours of sleep and feels well rested.  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  He will start this in next 20 days.  They are starting with small doses because of his heart.  He feels confident in the plan his Drs have for him.    Expected Outcomes Short: continue to work with Drs on treatment plan Long: continue to exercise to help manage stress             Vocational Rehabilitation: Provide vocational rehab assistance to qualifying candidates.   Vocational Rehab Evaluation & Intervention:   Education: Education Goals: Education classes will be provided on a variety of topics geared toward better understanding of heart health and risk factor modification. Participant will state understanding/return demonstration of topics presented as noted by education test  scores.  Learning Barriers/Preferences:  Learning Barriers/Preferences - 04/30/20 0910       Learning Barriers/Preferences   Learning Barriers None    Learning Preferences None             General Cardiac Education Topics:  AED/CPR: - Group verbal and written instruction with the use of models to demonstrate the basic use of the AED with the basic ABC's of resuscitation.   Anatomy and Cardiac Procedures: - Group verbal and visual presentation and models provide information about basic cardiac anatomy and function. Reviews the testing methods done to diagnose heart disease and the outcomes of the test results. Describes the treatment choices: Medical Management, Angioplasty, or Coronary Bypass Surgery for treating various heart conditions including Myocardial Infarction, Angina, Valve Disease, and Cardiac Arrhythmias.  Written material given at graduation.   Medication Safety: - Group verbal and visual instruction to review commonly prescribed medications for heart and lung disease. Reviews the medication, class of the drug, and side  effects. Includes the steps to properly store meds and maintain the prescription regimen.  Written material given at graduation. Flowsheet Row Cardiac Rehab from 06/24/2020 in [facility] Cardiac and Pulmonary Rehab  Date 06/10/20  Educator SB  Instruction Review Code 1- Verbalizes Understanding       Intimacy: - Group verbal instruction through game format to discuss how heart and lung disease can affect sexual intimacy. Written material given at graduation..   Know Your Numbers and Heart Failure: - Group verbal and visual instruction to discuss disease risk factors for cardiac and pulmonary disease and treatment options.  Reviews associated critical values for Overweight/Obesity, Hypertension, Cholesterol, and Diabetes.  Discusses basics of heart failure: signs/symptoms and treatments.  Introduces Heart Failure Zone chart for action plan for heart  failure.  Written material given at graduation.   Infection Prevention: - Provides verbal and written material to individual with discussion of infection control including proper hand washing and proper equipment cleaning during exercise session. Flowsheet Row Cardiac Rehab from 06/24/2020 in [facility] Cardiac and Pulmonary Rehab  Date 04/30/20  Educator [facility]  Instruction Review Code 1- Verbalizes Understanding       Falls Prevention: - Provides verbal and written material to individual with discussion of falls prevention and safety. Flowsheet Row Cardiac Rehab from 06/24/2020 in [facility] Cardiac and Pulmonary Rehab  Date 04/30/20  Educator [facility]  Instruction Review Code 1- Verbalizes Understanding       Other: -Provides group and verbal instruction on various topics (see comments)   Knowledge Questionnaire Score:  Knowledge Questionnaire Score - 05/04/20 1227       Knowledge Questionnaire Score   Pre Score 22/26: Angina, Nutrition, Exercise             Core Components/Risk Factors/Patient Goals at Admission:  Personal Goals and Risk Factors at Admission - 05/04/20 1307       Core Components/Risk Factors/Patient Goals on Admission    Weight Management Yes;Weight Maintenance    Intervention Weight Management: Develop a combined nutrition and exercise program designed to reach desired caloric intake, while maintaining appropriate intake of nutrient and fiber, sodium and fats, and appropriate energy expenditure required for the weight goal.;Weight Management: Provide education and appropriate resources to help participant work on and attain dietary goals.;Weight Management/Obesity: Establish reasonable short term and long term weight goals.    Admit Weight 203 lb (92.1 kg)    Goal Weight: Short Term 203 lb (92.1 kg)    Goal Weight: Long Term 203 lb (92.1 kg)    Expected Outcomes Short Term: Continue to assess and modify interventions until short term weight is achieved;Long Term:  Adherence to nutrition and physical activity/exercise program aimed toward attainment of established weight goal;Weight Maintenance: Understanding of the daily nutrition guidelines, which includes 25-35% calories from fat, 7% or less cal from saturated fats, less than 212m cholesterol, less than 1.5gm of sodium, & 5 or more servings of fruits and vegetables daily;Understanding recommendations for meals to include 15-35% energy as protein, 25-35% energy from fat, 35-60% energy from carbohydrates, less than 2089mof dietary cholesterol, 20-35 gm of total fiber daily;Understanding of distribution of calorie intake throughout the day with the consumption of 4-5 meals/snacks    Diabetes Yes    Intervention Provide education about signs/symptoms and action to take for hypo/hyperglycemia.;Provide education about proper nutrition, including hydration, and aerobic/resistive exercise prescription along with prescribed medications to achieve blood glucose in normal ranges: Fasting glucose 65-99 mg/dL    Expected Outcomes Short Term: Participant verbalizes  understanding of the signs/symptoms and immediate care of hyper/hypoglycemia, proper foot care and importance of medication, aerobic/resistive exercise and nutrition plan for blood glucose control.;Long Term: Attainment of HbA1C < 7%.    Heart Failure Yes    Intervention Provide a combined exercise and nutrition program that is supplemented with education, support and counseling about heart failure. Directed toward relieving symptoms such as shortness of breath, decreased exercise tolerance, and extremity edema.    Expected Outcomes Improve functional capacity of life;Short term: Attendance in program 2-3 days a week with increased exercise capacity. Reported lower sodium intake. Reported increased fruit and vegetable intake. Reports medication compliance.;Short term: Daily weights obtained and reported for increase. Utilizing diuretic protocols set by physician.;Long  term: Adoption of self-care skills and reduction of barriers for early signs and symptoms recognition and intervention leading to self-care maintenance.    Hypertension Yes    Intervention Provide education on lifestyle modifcations including regular physical activity/exercise, weight management, moderate sodium restriction and increased consumption of fresh fruit, vegetables, and low fat dairy, alcohol moderation, and smoking cessation.;Monitor prescription use compliance.    Expected Outcomes Short Term: Continued assessment and intervention until BP is < 140/54m HG in hypertensive participants. < 130/861mHG in hypertensive participants with diabetes, heart failure or chronic kidney disease.;Long Term: Maintenance of blood pressure at goal levels.    Lipids Yes    Intervention Provide education and support for participant on nutrition & aerobic/resistive exercise along with prescribed medications to achieve LDL <7076mHDL >73m33m  Expected Outcomes Short Term: Participant states understanding of desired cholesterol values and is compliant with medications prescribed. Participant is following exercise prescription and nutrition guidelines.;Long Term: Cholesterol controlled with medications as prescribed, with individualized exercise RX and with personalized nutrition plan. Value goals: LDL < 70mg71mL > 40 mg.             Education:Diabetes - Individual verbal and written instruction to review signs/symptoms of diabetes, desired ranges of glucose level fasting, after meals and with exercise. Acknowledge that pre and post exercise glucose checks will be done for 3 sessions at entry of program. [facility]  Date 04/30/20  [facility] 1- Verbalizes Understanding  [States nver been diagnosed with DM and does not check BG]       Core Components/Risk Factors/Patient Goals Review:   Goals and Risk Factor  Review     Row Name 06/08/20 1411 06/24/20 1425           Core Components/Risk Factors/Patient Goals Review   Personal Goals Review Weight Management/Obesity;Hypertension;Diabetes;Heart Failure;Lipids --      Review His weight is holding steady over the last 30 days.  He denies any heart falure symptoms and he is back to sleeping on his side.  He is staying on top of his lasix and gets between 1600-2000 ml fluid each day and he continues to measure it daily.  Blood pressures have been good in class and  he checks it on occassion at home and when he feels a little off.  We talked about the benefits of track his pressures.  He does not check his sugars as he is diet controlled.  He has not had any feelings of feeling off with them. Willden Goederts his weight is staying steady within a 3 lb range.  He weighs daily.  BP has been good in class.  He saw cardiologist today and got a  good report.  He goes back in 4 months.      Expected Outcomes Short: Continue to montior weight closely Long: Conitnue to manage heart failure. Short: continue to monitor HF symptoms               Core Components/Risk Factors/Patient Goals at Discharge (Final Review):   Goals and Risk Factor Review - 06/24/20 1425       Core Components/Risk Factors/Patient Goals Review   Review Hillery reports his weight is staying steady within a 3 lb range.  He weighs daily.  BP has been good in class.  He saw cardiologist today and got a good report.  He goes back in 4 months.    Expected Outcomes Short: continue to monitor HF symptoms             ITP Comments:  ITP Comments     Row Name 04/30/20 0914 05/04/20 1223 05/13/20 1000 05/14/20 1407 05/19/20 1001   ITP Comments Virtual Visit completed. Patient informed on EP and RD appointment and 6 Minute walk test. Patient also informed of patient health questionnaires on My Chart. Patient Verbalizes understanding. Visit diagnosis can be found in [facility] 03/21/2020. Completed 6MWT and  gym orientation. Initial ITP created and sent for review to Dr. Emily Filbert, Medical Director. 30 Day review completed. Medical Director ITP review done, changes made as directed, and signed approval by Medical Director. First full day of exercise!  Patient was oriented to gym and equipment including functions, settings, policies, and procedures.  Patient's individual exercise prescription and treatment plan were reviewed.  All starting workloads were established based on the results of the 6 minute walk test done at initial orientation visit.  The plan for exercise progression was also introduced and progression will be customized based on patient's performance and goals. Completed initial RD Consultation    [place] Name 06/10/20 0659 07/08/20 0804         ITP Comments 30 Day review completed. Medical Director ITP review done, changes made as directed, and signed approval by Medical Director. 30 Day review completed. Medical Director ITP review done, changes made as directed, and signed approval by Medical Director.               Comments:

## 2020-07-08 NOTE — Progress Notes (Signed)
Daily Session Note  Patient Details  Name: Roy [name] MRN: [id number] Date of Birth: 1945/07/19 Referring Provider:   Flowsheet Row Cardiac Rehab from 05/04/2020 in [facility] Cardiac and Pulmonary Rehab  Referring Provider [name] MD       Encounter Date: 07/08/2020  Check In:  Session Check In - 07/08/20 1409       Check-In   Supervising physician immediately available to respond to emergencies See telemetry face sheet for immediately available ER MD    Location [facility]-Cardiac & Pulmonary Rehab    Staff Present Birdie Sons, MPA, RN;Joseph Lou Miner, MS, ASCM CEP, Exercise Physiologist    Virtual Visit No    Medication changes reported     No    Fall or balance concerns reported    No    Warm-up and Cool-down Performed on first and last piece of equipment    Resistance Training Performed Yes    VAD Patient? No    PAD/SET Patient? No      Pain Assessment   Currently in Pain? No/denies                Social History   Tobacco Use  Smoking Status Former   Pack years: 0.00   Types: Pipe   Quit date: 11/29/1976   Years since quitting: 43.6  Smokeless Tobacco Never    Goals Met:  Independence with exercise equipment Exercise tolerated well No report of cardiac concerns or symptoms Strength training completed today  Goals Unmet:  Not Applicable  Comments: Pt able to follow exercise prescription today without complaint.  Will continue to monitor for progression.    Dr. Emily Filbert is Medical Director for [facility].  Dr. Ottie Glazier is Medical Director for [facility] Pulmonary Rehabilitation.

## 2020-07-09 ENCOUNTER — Inpatient Hospital Stay: Payer: Medicare HMO

## 2020-07-13 ENCOUNTER — Other Ambulatory Visit: Payer: Self-pay | Admitting: Hematology & Oncology

## 2020-07-13 ENCOUNTER — Inpatient Hospital Stay: Payer: Medicare HMO

## 2020-07-13 ENCOUNTER — Other Ambulatory Visit: Payer: Self-pay

## 2020-07-13 VITALS — BP 125/76 | HR 86 | Temp 98.2°F | Resp 18

## 2020-07-13 DIAGNOSIS — C8330 Diffuse large B-cell lymphoma, unspecified site: Secondary | ICD-10-CM

## 2020-07-13 DIAGNOSIS — Z5112 Encounter for antineoplastic immunotherapy: Secondary | ICD-10-CM | POA: Diagnosis not present

## 2020-07-13 DIAGNOSIS — C8335 Diffuse large B-cell lymphoma, lymph nodes of inguinal region and lower limb: Secondary | ICD-10-CM | POA: Diagnosis not present

## 2020-07-13 DIAGNOSIS — Z79899 Other long term (current) drug therapy: Secondary | ICD-10-CM | POA: Diagnosis not present

## 2020-07-13 DIAGNOSIS — Z7189 Other specified counseling: Secondary | ICD-10-CM

## 2020-07-13 DIAGNOSIS — Z5111 Encounter for antineoplastic chemotherapy: Secondary | ICD-10-CM | POA: Diagnosis not present

## 2020-07-13 DIAGNOSIS — Z5189 Encounter for other specified aftercare: Secondary | ICD-10-CM | POA: Diagnosis not present

## 2020-07-13 DIAGNOSIS — Z95828 Presence of other vascular implants and grafts: Secondary | ICD-10-CM

## 2020-07-13 LAB — CMP (CANCER CENTER ONLY)
ALT: 16 U/L (ref 0–44)
AST: 19 U/L (ref 15–41)
Albumin: 3.9 g/dL (ref 3.5–5.0)
Alkaline Phosphatase: 60 U/L (ref 38–126)
Anion gap: 9 (ref 5–15)
BUN: 14 mg/dL (ref 8–23)
CO2: 24 mmol/L (ref 22–32)
Calcium: 8 mg/dL — ABNORMAL LOW (ref 8.9–10.3)
Chloride: 107 mmol/L (ref 98–111)
Creatinine: 1.3 mg/dL — ABNORMAL HIGH (ref 0.61–1.24)
GFR, Estimated: 57 mL/min — ABNORMAL LOW (ref >60)
Glucose, Bld: 159 mg/dL — ABNORMAL HIGH (ref 70–99)
Potassium: 3.3 mmol/L — ABNORMAL LOW (ref 3.5–5.1)
Sodium: 140 mmol/L (ref 135–145)
Total Bilirubin: 0.8 mg/dL (ref 0.3–1.2)
Total Protein: 7.2 g/dL (ref 6.5–8.1)

## 2020-07-13 LAB — CBC WITH DIFFERENTIAL/PLATELET
Abs Immature Granulocytes: 0.01 10*3/uL (ref 0.00–0.07)
Basophils Absolute: 0.1 10*3/uL (ref 0.0–0.1)
Basophils Relative: 1 %
Eosinophils Absolute: 0.1 10*3/uL (ref 0.0–0.5)
Eosinophils Relative: 2 %
HCT: 32.3 % — ABNORMAL LOW (ref 39.0–52.0)
Hemoglobin: 10.1 g/dL — ABNORMAL LOW (ref 13.0–17.0)
Immature Granulocytes: 0 %
Lymphocytes Relative: 18 %
Lymphs Abs: 1.3 10*3/uL (ref 0.7–4.0)
MCH: 26.6 pg (ref 26.0–34.0)
MCHC: 31.3 g/dL (ref 30.0–36.0)
MCV: 85.2 fL (ref 80.0–100.0)
Monocytes Absolute: 0.6 10*3/uL (ref 0.1–1.0)
Monocytes Relative: 8 %
Neutro Abs: 5.2 10*3/uL (ref 1.7–7.7)
Neutrophils Relative %: 71 %
Platelets: 214 10*3/uL (ref 150–400)
RBC: 3.79 MIL/uL — ABNORMAL LOW (ref 4.22–5.81)
RDW: 15.9 % — ABNORMAL HIGH (ref 11.5–15.5)
WBC: 7.3 10*3/uL (ref 4.0–10.5)
nRBC: 0 % (ref 0.0–0.2)

## 2020-07-13 LAB — URIC ACID: Uric Acid, Serum: 5.7 mg/dL (ref 3.7–8.6)

## 2020-07-13 LAB — LACTATE DEHYDROGENASE: LDH: 134 U/L (ref 98–192)

## 2020-07-13 MED ORDER — SODIUM CHLORIDE 0.9 % IV SOLN
375.0000 mg/m2 | Freq: Once | INTRAVENOUS | Status: AC
  Administered 2020-07-13: 800 mg via INTRAVENOUS
  Filled 2020-07-13: qty 50

## 2020-07-13 MED ORDER — ALLOPURINOL 300 MG PO TABS
300.0000 mg | ORAL_TABLET | Freq: Once | ORAL | Status: AC
  Administered 2020-07-13: 300 mg via ORAL
  Filled 2020-07-13: qty 1

## 2020-07-13 MED ORDER — SODIUM CHLORIDE 0.9 % IV SOLN
400.0000 mg/m2 | Freq: Once | INTRAVENOUS | Status: AC
  Administered 2020-07-13: 860 mg via INTRAVENOUS
  Filled 2020-07-13: qty 43

## 2020-07-13 MED ORDER — SODIUM CHLORIDE 0.9 % IV SOLN
10.0000 mg | Freq: Once | INTRAVENOUS | Status: AC
  Administered 2020-07-13: 10 mg via INTRAVENOUS
  Filled 2020-07-13: qty 10

## 2020-07-13 MED ORDER — DIPHENHYDRAMINE HCL 25 MG PO CAPS
50.0000 mg | ORAL_CAPSULE | Freq: Once | ORAL | Status: AC
Start: 2020-07-13 — End: 2020-07-13
  Administered 2020-07-13: 50 mg via ORAL

## 2020-07-13 MED ORDER — HEPARIN SOD (PORK) LOCK FLUSH 100 UNIT/ML IV SOLN
500.0000 [IU] | Freq: Once | INTRAVENOUS | Status: AC | PRN
  Administered 2020-07-13: 500 [IU]
  Filled 2020-07-13: qty 5

## 2020-07-13 MED ORDER — ONDANSETRON HCL 8 MG PO TABS: 8.0000 mg | ORAL_TABLET | Freq: Three times a day (TID) | ORAL | 3 refills | Status: DC | PRN

## 2020-07-13 MED ORDER — ACETAMINOPHEN 325 MG PO TABS
650.0000 mg | ORAL_TABLET | Freq: Once | ORAL | Status: AC
Start: 2020-07-13 — End: 2020-07-13
  Administered 2020-07-13: 650 mg via ORAL

## 2020-07-13 MED ORDER — VINCRISTINE SULFATE CHEMO INJECTION 1 MG/ML
1.0000 mg | Freq: Once | INTRAVENOUS | Status: AC
  Administered 2020-07-13: 1 mg via INTRAVENOUS
  Filled 2020-07-13: qty 1

## 2020-07-13 MED ORDER — PROCHLORPERAZINE MALEATE 10 MG PO TABS: 10.0000 mg | ORAL_TABLET | Freq: Three times a day (TID) | ORAL | 2 refills | Status: DC | PRN

## 2020-07-13 MED ORDER — PALONOSETRON HCL INJECTION 0.25 MG/5ML
INTRAVENOUS | Status: AC
  Filled 2020-07-13: qty 5

## 2020-07-13 MED ORDER — SODIUM CHLORIDE 0.9% FLUSH
10.0000 mL | INTRAVENOUS | Status: DC | PRN
  Administered 2020-07-13: 10 mL
  Filled 2020-07-13: qty 10

## 2020-07-13 MED ORDER — ACETAMINOPHEN 325 MG PO TABS
ORAL_TABLET | ORAL | Status: AC
  Filled 2020-07-13: qty 2

## 2020-07-13 MED ORDER — SODIUM CHLORIDE 0.9% FLUSH
10.0000 mL | Freq: Once | INTRAVENOUS | Status: AC
Start: 2020-07-13 — End: 2020-07-13
  Administered 2020-07-13: 10 mL
  Filled 2020-07-13: qty 10

## 2020-07-13 MED ORDER — DIPHENHYDRAMINE HCL 25 MG PO CAPS
ORAL_CAPSULE | ORAL | Status: AC
  Filled 2020-07-13: qty 2

## 2020-07-13 MED ORDER — PALONOSETRON HCL INJECTION 0.25 MG/5ML
0.2500 mg | Freq: Once | INTRAVENOUS | Status: AC
Start: 2020-07-13 — End: 2020-07-13
  Administered 2020-07-13: 0.25 mg via INTRAVENOUS

## 2020-07-13 MED ORDER — SODIUM CHLORIDE 0.9 % IV SOLN
Freq: Once | INTRAVENOUS | Status: AC
  Filled 2020-07-13: qty 250

## 2020-07-13 MED ORDER — SODIUM CHLORIDE 0.9 % IV SOLN
25.0000 mg/m2 | Freq: Once | INTRAVENOUS | Status: AC
  Administered 2020-07-13: 50 mg via INTRAVENOUS
  Filled 2020-07-13: qty 2.5

## 2020-07-13 MED ORDER — LORAZEPAM 0.5 MG PO TABS: 0.5000 mg | ORAL_TABLET | Freq: Four times a day (QID) | ORAL | 0 refills | Status: DC | PRN

## 2020-07-13 NOTE — Progress Notes (Signed)
Give allopurinol 300mg  x 1 today prior to chemo per  Dr Lorenso Courier

## 2020-07-13 NOTE — Patient Instructions (Signed)
[facility]  Discharge Instructions: Thank you for choosing [facility] to provide your oncology and hematology care.   If you have a lab appointment with the [facility], please go directly to the [facility] and check in at the registration area.   Wear comfortable clothing and clothing appropriate for easy access to any Portacath or PICC line.   We strive to give you quality time with your provider. You may need to reschedule your appointment if you arrive late (15 or more minutes).  Arriving late affects you and other patients whose appointments are after yours.  Also, if you miss three or more appointments without notifying the office, you may be dismissed from the clinic at the provider's discretion.      For prescription refill requests, have your pharmacy contact our office and allow 72 hours for refills to be completed.    Today you received the following chemotherapy and/or immunotherapy agents Vincristine; Cytoxin; Etoposide and Rituxin      To help prevent nausea and vomiting after your treatment, we encourage you to take your nausea medication as directed.  BELOW ARE SYMPTOMS THAT SHOULD BE REPORTED IMMEDIATELY: *FEVER GREATER THAN 100.4 F (38 C) OR HIGHER *CHILLS OR SWEATING *NAUSEA AND VOMITING THAT IS NOT CONTROLLED WITH YOUR NAUSEA MEDICATION *UNUSUAL SHORTNESS OF BREATH *UNUSUAL BRUISING OR BLEEDING *URINARY PROBLEMS (pain or burning when urinating, or frequent urination) *BOWEL PROBLEMS (unusual diarrhea, constipation, pain near the anus) TENDERNESS IN MOUTH AND THROAT WITH OR WITHOUT PRESENCE OF ULCERS (sore throat, sores in mouth, or a toothache) UNUSUAL RASH, SWELLING OR PAIN  UNUSUAL VAGINAL DISCHARGE OR ITCHING   Items with * indicate a potential emergency and should be followed up as soon as possible or go to the Emergency Department if any problems should occur.  Please show the CHEMOTHERAPY ALERT CARD or  IMMUNOTHERAPY ALERT CARD at check-in to the Emergency Department and triage nurse.  Should you have questions after your visit or need to cancel or reschedule your appointment, please contact [facility]  Dept: [phone]  and follow the prompts.  Office hours are 8:00 a.m. to 4:30 p.m. Monday - Friday. Please note that voicemails left after 4:00 p.m. may not be returned until the following business day.  We are closed weekends and major holidays. You have access to a nurse at all times for urgent questions. Please call the main number to the clinic Dept: [phone] and follow the prompts.   For any non-urgent questions, you may also contact your provider using MyChart. We now offer e-Visits for anyone 10 and older to request care online for non-urgent symptoms. For details visit [email or web address].   Also download the MyChart app! Go to the app store, search "MyChart", open the app, select [facility], and log in with your MyChart username and password.  Due to Covid, a mask is required upon entering the hospital/clinic. If you do not have a mask, one will be given to you upon arrival. For doctor visits, patients may have 1 support person aged 67 or older with them. For treatment visits, patients cannot have anyone with them due to current Covid guidelines and our immunocompromised population.   Vincristine injection What is this medication? VINCRISTINE (vin KRIS teen) is a chemotherapy drug. It slows the growth of cancer cells. This medicine is used to treat many types of cancer like Hodgkin's disease, leukemia, non-Hodgkin's lymphoma, neuroblastoma (braincancer), rhabdomyosarcoma, and Wilms' tumor. This medicine  may be used for other purposes; ask your health care provider orpharmacist if you have questions. COMMON BRAND NAME(S): Oncovin, Vincasar PFS What should I tell my care team before I take this medication? They need to know if you have any of these  conditions: blood disorders gout infection (especially chickenpox, cold sores, or herpes) kidney disease liver disease lung disease nervous system disease like Charcot-Marie-Tooth (CMT) recent or ongoing radiation therapy an unusual or allergic reaction to vincristine, other chemotherapy agents, other medicines, foods, dyes, or preservatives pregnant or trying to get pregnant breast-feeding How should I use this medication? This drug is given as an infusion into a vein. It is administered in a hospital or clinic by a specially trained health care professional. If you have pain, swelling, burning, or any unusual feeling around the site of your injection,tell your health care professional right away. Talk to your pediatrician regarding the use of this medicine in children. Whilethis drug may be prescribed for selected conditions, precautions do apply. Overdosage: If you think you have taken too much of this medicine contact apoison control center or emergency room at once. NOTE: This medicine is only for you. Do not share this medicine with others. What if I miss a dose? It is important not to miss your dose. Call your doctor or health careprofessional if you are unable to keep an appointment. What may interact with this medication? certain medicines for fungal infections like itraconazole, ketoconazole, posaconazole, voriconazole certain medicines for seizures like phenytoin This list may not describe all possible interactions. Give your health care provider a list of all the medicines, herbs, non-prescription drugs, or dietary supplements you use. Also tell them if you smoke, drink alcohol, or use illegaldrugs. Some items may interact with your medicine. What should I watch for while using this medication? This drug may make you feel generally unwell. This is not uncommon, as chemotherapy can affect healthy cells as well as cancer cells. Report any side effects. Continue your course of  treatment even though you feel ill unless yourdoctor tells you to stop. You may need blood work done while you are taking this medicine. This medicine will cause constipation. Try to have a bowel movement at least every 2 to 3 days. If you do not have a bowel movement for 3 days, call yourdoctor or health care professional. In some cases, you may be given additional medicines to help with side effects.Follow all directions for their use. Do not become pregnant while taking this medicine. Women should inform their doctor if they wish to become pregnant or think they might be pregnant. There is a potential for serious side effects to an unborn child. Talk to your health care professional or pharmacist for more information. Do not breast-feed aninfant while taking this medicine. This medicine may make it more difficult to get pregnant or to father a child.Talk to your healthcare professional if you are concerned about your fertility. What side effects may I notice from receiving this medication? Side effects that you should report to your doctor or health care professionalas soon as possible: allergic reactions like skin rash, itching or hives, swelling of the face, lips, or tongue breathing problems confusion or changes in emotions or moods constipation cough mouth sores muscle weakness nausea and vomiting pain, swelling, redness or irritation at the injection site pain, tingling, numbness in the hands or feet problems with balance, talking, walking seizures stomach pain trouble passing urine or change in the amount of urine Side effects that  usually do not require medical attention (report to yourdoctor or health care professional if they continue or are bothersome): diarrhea hair loss jaw pain loss of appetite This list may not describe all possible side effects. Call your doctor for medical advice about side effects. You may report side effects to FDA at1-800-FDA-1088. Where should I  keep my medication? This drug is given in a hospital or clinic and will not be stored at home. NOTE: This sheet is a summary. It may not cover all possible information. If you have questions about this medicine, talk to your doctor, pharmacist, orhealth care provider.  2022 Elsevier/Gold Standard (2018-12-11 17:05:13)  Cyclophosphamide Injection What is this medication? CYCLOPHOSPHAMIDE (sye kloe FOSS fa mide) is a chemotherapy drug. It slows the growth of cancer cells. This medicine is used to treat many types of cancer like lymphoma, myeloma, leukemia, breast cancer, and ovarian cancer, to name afew. This medicine may be used for other purposes; ask your health care provider orpharmacist if you have questions. COMMON BRAND NAME(S): Cytoxan, Neosar What should I tell my care team before I take this medication? They need to know if you have any of these conditions: heart disease history of irregular heartbeat infection kidney disease liver disease low blood counts, like white cells, platelets, or red blood cells on hemodialysis recent or ongoing radiation therapy scarring or thickening of the lungs trouble passing urine an unusual or allergic reaction to cyclophosphamide, other medicines, foods, dyes, or preservatives pregnant or trying to get pregnant breast-feeding How should I use this medication? This drug is usually given as an injection into a vein or muscle or by infusion into a vein. It is administered in a hospital or clinic by a specially trainedhealth care professional. Talk to your pediatrician regarding the use of this medicine in children.Special care may be needed. Overdosage: If you think you have taken too much of this medicine contact apoison control center or emergency room at once. NOTE: This medicine is only for you. Do not share this medicine with others. What if I miss a dose? It is important not to miss your dose. Call your doctor or health careprofessional if  you are unable to keep an appointment. What may interact with this medication? amphotericin B azathioprine certain antivirals for HIV or hepatitis certain medicines for blood pressure, heart disease, irregular heart beat certain medicines that treat or prevent blood clots like warfarin certain other medicines for cancer cyclosporine etanercept indomethacin medicines that relax muscles for surgery medicines to increase blood counts metronidazole This list may not describe all possible interactions. Give your health care provider a list of all the medicines, herbs, non-prescription drugs, or dietary supplements you use. Also tell them if you smoke, drink alcohol, or use illegaldrugs. Some items may interact with your medicine. What should I watch for while using this medication? Your condition will be monitored carefully while you are receiving thismedicine. You may need blood work done while you are taking this medicine. Drink water or other fluids as directed. Urinate often, even at night. Some products may contain alcohol. Ask your health care professional if this medicine contains alcohol. Be sure to tell all health care professionals you are taking this medicine. Certain medicines, like metronidazole and disulfiram, can cause an unpleasant reaction when taken with alcohol. The reaction includes flushing, headache, nausea, vomiting, sweating, and increased thirst. Thereaction can last from 30 minutes to several hours. Do not become pregnant while taking this medicine or for 1 year after stopping  it. Women should inform their health care professional if they wish to become pregnant or think they might be pregnant. Men should not father a child while taking this medicine and for 4 months after stopping it. There is potential for serious side effects to an unborn child. Talk to your health care professionalfor more information. Do not breast-feed an infant while taking this medicine or for 1 week  afterstopping it. This medicine has caused ovarian failure in some women. This medicine may make it more difficult to get pregnant. Talk to your health care professional if [place] concerned about your fertility. This medicine has caused decreased sperm counts in some men. This may make it more difficult to father a child. Talk to your health care professional if [place] concerned about your fertility. Call your health care professional for advice if you get a fever, chills, or sore throat, or other symptoms of a cold or flu. Do not treat yourself. This medicine decreases your body's ability to fight infections. Try to avoid beingaround people who are sick. Avoid taking medicines that contain aspirin, acetaminophen, ibuprofen, naproxen, or ketoprofen unless instructed by your health care professional.These medicines may hide a fever. Talk to your health care professional about your risk of cancer. You may bemore at risk for certain types of cancer if you take this medicine. If you are going to need surgery or other procedure, tell your health careprofessional that you are using this medicine. Be careful brushing or flossing your teeth or using a toothpick because you may get an infection or bleed more easily. If you have any dental work done, Primary school teacher you are receiving this medicine. What side effects may I notice from receiving this medication? Side effects that you should report to your doctor or health care professionalas soon as possible: allergic reactions like skin rash, itching or hives, swelling of the face, lips, or tongue breathing problems nausea, vomiting signs and symptoms of bleeding such as bloody or black, tarry stools; red or dark brown urine; spitting up blood or brown material that looks like coffee grounds; red spots on the skin; unusual bruising or bleeding from the eyes, gums, or nose signs and symptoms of heart failure like fast, irregular heartbeat, sudden weight gain;  swelling of the ankles, feet, hands signs and symptoms of infection like fever; chills; cough; sore throat; pain or trouble passing urine signs and symptoms of kidney injury like trouble passing urine or change in the amount of urine signs and symptoms of liver injury like dark yellow or brown urine; general ill feeling or flu-like symptoms; light-colored stools; loss of appetite; nausea; right upper belly pain; unusually weak or tired; yellowing of the eyes or skin Side effects that usually do not require medical attention (report to yourdoctor or health care professional if they continue or are bothersome): confusion decreased hearing diarrhea facial flushing hair loss headache loss of appetite missed menstrual periods signs and symptoms of low red blood cells or anemia such as unusually weak or tired; feeling faint or lightheaded; falls skin discoloration This list may not describe all possible side effects. Call your doctor for medical advice about side effects. You may report side effects to FDA at1-800-FDA-1088. Where should I keep my medication? This drug is given in a hospital or clinic and will not be stored at home. NOTE: This sheet is a summary. It may not cover all possible information. If you have questions about this medicine, talk to your doctor, pharmacist, orhealth care provider.  2022 Elsevier/Gold Standard (2018-10-15 09:53:29)  Etoposide, VP-16 injection What is this medication? ETOPOSIDE, VP-16 (e toe POE side) is a chemotherapy drug. It is used to treattesticular cancer, lung cancer, and other cancers. This medicine may be used for other purposes; ask your health care provider orpharmacist if you have questions. COMMON BRAND NAME(S): Etopophos, Toposar, VePesid What should I tell my care team before I take this medication? They need to know if you have any of these conditions: infection kidney disease liver disease low blood counts, like low white cell, platelet,  or red cell counts an unusual or allergic reaction to etoposide, other medicines, foods, dyes, or preservatives pregnant or trying to get pregnant breast-feeding How should I use this medication? This medicine is for infusion into a vein. It is administered in a hospital orclinic by a specially trained health care professional. Talk to your pediatrician regarding the use of this medicine in children.Special care may be needed. Overdosage: If you think you have taken too much of this medicine contact apoison control center or emergency room at once. NOTE: This medicine is only for you. Do not share this medicine with others. What if I miss a dose? It is important not to miss your dose. Call your doctor or health careprofessional if you are unable to keep an appointment. What may interact with this medication? This medicine may interact with the following medications: warfarin This list may not describe all possible interactions. Give your health care provider a list of all the medicines, herbs, non-prescription drugs, or dietary supplements you use. Also tell them if you smoke, drink alcohol, or use illegaldrugs. Some items may interact with your medicine. What should I watch for while using this medication? Visit your doctor for checks on your progress. This drug may make you feel generally unwell. This is not uncommon, as chemotherapy can affect healthy cells as well as cancer cells. Report any side effects. Continue your course oftreatment even though you feel ill unless your doctor tells you to stop. In some cases, you may be given additional medicines to help with side effects.Follow all directions for their use. Call your doctor or health care professional for advice if you get a fever, chills or sore throat, or other symptoms of a cold or flu. Do not treat yourself. This drug decreases your body's ability to fight infections. Try toavoid being around people who are sick. This medicine may  increase your risk to bruise or bleed. Call your doctor orhealth care professional if you notice any unusual bleeding. Talk to your doctor about your risk of cancer. You may be more at risk forcertain types of cancers if you take this medicine. Do not become pregnant while taking this medicine or for at least 6 months after stopping it. Women should inform their doctor if they wish to become pregnant or think they might be pregnant. Women of child-bearing potential will need to have a negative pregnancy test before starting this medicine. There is a potential for serious side effects to an unborn child. Talk to your health care professional or pharmacist for more information. Do not breast-feed aninfant while taking this medicine. Men must use a latex condom during sexual contact with a woman while taking this medicine and for at least 4 months after stopping it. A latex condom is needed even if you have had a vasectomy. Contact your doctor right away if your partner becomes pregnant. Do not donate sperm while taking this medicine and for at least 4  months after you stop taking this medicine. Men should inform their doctors if they wish to father a child. This medicine may lower spermcounts. What side effects may I notice from receiving this medication? Side effects that you should report to your doctor or health care professionalas soon as possible: allergic reactions like skin rash, itching or hives, swelling of the face, lips, or tongue low blood counts - this medicine may decrease the number of white blood cells, red blood cells, and platelets. You may be at increased risk for infections and bleeding nausea, vomiting redness, blistering, peeling or loosening of the skin, including inside the mouth signs and symptoms of infection like fever; chills; cough; sore throat; pain or trouble passing urine signs and symptoms of low red blood cells or anemia such as unusually weak or tired; feeling faint or  lightheaded; falls; breathing problems unusual bruising or bleeding Side effects that usually do not require medical attention (report to yourdoctor or health care professional if they continue or are bothersome): changes in taste diarrhea hair loss loss of appetite mouth sores This list may not describe all possible side effects. Call your doctor for medical advice about side effects. You may report side effects to FDA at1-800-FDA-1088. Where should I keep my medication? This drug is given in a hospital or clinic and will not be stored at home. NOTE: This sheet is a summary. It may not cover all possible information. If you have questions about this medicine, talk to your doctor, pharmacist, orhealth care provider.  2022 Elsevier/Gold Standard (2018-03-07 16:57:15)  Rituximab Injection What is this medication? RITUXIMAB (ri TUX i mab) is a monoclonal antibody. It is used to treat certain types of cancer like non-Hodgkin lymphoma and chronic lymphocytic leukemia. It is also used to treat rheumatoid arthritis, granulomatosis with polyangiitis,microscopic polyangiitis, and pemphigus vulgaris. This medicine may be used for other purposes; ask your health care provider orpharmacist if you have questions. COMMON BRAND NAME(S): RIABNI, Rituxan, RUXIENCE What should I tell my care team before I take this medication? They need to know if you have any of these conditions: chest pain heart disease infection especially a viral infection such as chickenpox, cold sores, hepatitis B, or herpes immune system problems irregular heartbeat or rhythm kidney disease low blood counts (white cells, platelets, or red cells) lung disease recent or upcoming vaccine an unusual or allergic reaction to rituximab, other medicines, foods, dyes, or preservatives pregnant or trying to get pregnant breast-feeding How should I use this medication? This medicine is injected into a vein. It is given by a health care  provider ina hospital or clinic setting. A special MedGuide will be given to you before each treatment. Be sure to readthis information carefully each time. Talk to your health care provider about the use of this medicine in children. While this drug may be prescribed for children as young as 6 months forselected conditions, precautions do apply. Overdosage: If you think you have taken too much of this medicine contact apoison control center or emergency room at once. NOTE: This medicine is only for you. Do not share this medicine with others. What if I miss a dose? Keep appointments for follow-up doses. It is important not to miss your dose.Call your health care provider if you are unable to keep an appointment. What may interact with this medication? Do not take this medicine with any of the following medicines: live vaccines This medicine may also interact with the following medicines: cisplatin This list may not  describe all possible interactions. Give your health care provider a list of all the medicines, herbs, non-prescription drugs, or dietary supplements you use. Also tell them if you smoke, drink alcohol, or use illegaldrugs. Some items may interact with your medicine. What should I watch for while using this medication? Your condition will be monitored carefully while you are receiving thismedicine. You may need blood work done while you are taking this medicine. This medicine can cause serious infusion reactions. To reduce the risk your health care provider may give you other medicines to take before receiving thisone. Be sure to follow the directions from your health care provider. This medicine may increase your risk of getting an infection. Call your health care provider for advice if you get a fever, chills, sore throat, or other symptoms of a cold or flu. Do not treat yourself. Try to avoid being aroundpeople who are sick. Call your health care provider if you are around anyone with  measles,chickenpox, or if you develop sores or blisters that do not heal properly. Avoid taking medicines that contain aspirin, acetaminophen, ibuprofen, naproxen, or ketoprofen unless instructed by your health care provider. Thesemedicines may hide a fever. This medicine may cause serious skin reactions. They can happen weeks to months after starting the medicine. Contact your health care provider right away if you notice fevers or flu-like symptoms with a rash. The rash may be red or purple and then turn into blisters or peeling of the skin. Or, you might notice a red rash with swelling of the face, lips or lymph nodes in your neck or underyour arms. In some patients, this medicine may cause a serious brain infection that may cause death. If you have any problems seeing, thinking, speaking, walking, or standing, tell your healthcare professional right away. If you cannot reachyour healthcare professional, urgently seek other source of medical care. Do not become pregnant while taking this medicine or for at least 12 months after stopping it. Women should inform their health care provider if they wish to become pregnant or think they might be pregnant. There is potential for serious harm to an unborn child. Talk to your health care provider for more information. Women should use a reliable form of birth control while taking this medicine and for 12 months after stopping it. Do not breast-feed whiletaking this medicine or for at least 6 months after stopping it. What side effects may I notice from receiving this medication? Side effects that you should report to your health care provider as soon aspossible: allergic reactions (skin rash, itching or hives; swelling of the face, lips, or tongue) diarrhea edema (sudden weight gain; swelling of the ankles, feet, hands or other unusual swelling; trouble breathing) fast, irregular heartbeat heart attack (trouble breathing; pain or tightness in the chest, neck,  back or arms; unusually weak or tired) infection (fever, chills, cough, sore throat, pain or trouble passing urine) kidney injury (trouble passing urine or change in the amount of urine) liver injury (dark yellow or brown urine; general ill feeling or flu-like symptoms; loss of appetite, right upper belly pain; unusually weak or tired, yellowing of the eyes or skin) low blood pressure (dizziness; feeling faint or lightheaded, falls; unusually weak or tired) low red blood cell counts (trouble breathing; feeling faint; lightheaded, falls; unusually weak or tired) mouth sores redness, blistering, peeling, or loosening of the skin, including inside the mouth stomach pain unusual bruising or bleeding wheezing (trouble breathing with loud or whistling sounds) vomiting Side effects that  usually do not require medical attention (report to yourhealth care provider if they continue or are bothersome): headache joint pain muscle cramps, pain nausea This list may not describe all possible side effects. Call your doctor for medical advice about side effects. You may report side effects to FDA at1-800-FDA-1088. Where should I keep my medication? This medicine is given in a hospital or clinic. It will not be stored at home. NOTE: This sheet is a summary. It may not cover all possible information. If you have questions about this medicine, talk to your doctor, pharmacist, orhealth care provider.  2022 Elsevier/Gold Standard (2020-01-02 15:47:26)

## 2020-07-14 ENCOUNTER — Inpatient Hospital Stay: Payer: Medicare HMO

## 2020-07-14 VITALS — BP 116/66 | HR 87 | Temp 98.5°F | Resp 18

## 2020-07-14 DIAGNOSIS — Z5111 Encounter for antineoplastic chemotherapy: Secondary | ICD-10-CM | POA: Diagnosis not present

## 2020-07-14 DIAGNOSIS — Z5189 Encounter for other specified aftercare: Secondary | ICD-10-CM | POA: Diagnosis not present

## 2020-07-14 DIAGNOSIS — Z5112 Encounter for antineoplastic immunotherapy: Secondary | ICD-10-CM | POA: Diagnosis not present

## 2020-07-14 DIAGNOSIS — C8330 Diffuse large B-cell lymphoma, unspecified site: Secondary | ICD-10-CM

## 2020-07-14 DIAGNOSIS — C8335 Diffuse large B-cell lymphoma, lymph nodes of inguinal region and lower limb: Secondary | ICD-10-CM | POA: Diagnosis not present

## 2020-07-14 DIAGNOSIS — Z79899 Other long term (current) drug therapy: Secondary | ICD-10-CM | POA: Diagnosis not present

## 2020-07-14 DIAGNOSIS — Z7189 Other specified counseling: Secondary | ICD-10-CM

## 2020-07-14 MED ORDER — PROCHLORPERAZINE MALEATE 10 MG PO TABS
ORAL_TABLET | ORAL | Status: AC
  Filled 2020-07-14: qty 1

## 2020-07-14 MED ORDER — SODIUM CHLORIDE 0.9 % IV SOLN
Freq: Once | INTRAVENOUS | Status: AC
Start: 2020-07-14 — End: 2020-07-14
  Filled 2020-07-14: qty 250

## 2020-07-14 MED ORDER — PREDNISONE 50 MG PO TABS
100.0000 mg | ORAL_TABLET | Freq: Once | ORAL | Status: AC
  Administered 2020-07-14: 100 mg via ORAL

## 2020-07-14 MED ORDER — SODIUM CHLORIDE 0.9% FLUSH
10.0000 mL | INTRAVENOUS | Status: DC | PRN
Start: 2020-07-14 — End: 2020-07-14
  Administered 2020-07-14: 10 mL
  Filled 2020-07-14: qty 10

## 2020-07-14 MED ORDER — SODIUM CHLORIDE 0.9 % IV SOLN
25.0000 mg/m2 | Freq: Once | INTRAVENOUS | Status: AC
  Administered 2020-07-14: 50 mg via INTRAVENOUS
  Filled 2020-07-14: qty 2.5

## 2020-07-14 MED ORDER — HEPARIN SOD (PORK) LOCK FLUSH 100 UNIT/ML IV SOLN
500.0000 [IU] | Freq: Once | INTRAVENOUS | Status: AC | PRN
  Administered 2020-07-14: 500 [IU]
  Filled 2020-07-14: qty 5

## 2020-07-14 MED ORDER — PROCHLORPERAZINE MALEATE 10 MG PO TABS
10.0000 mg | ORAL_TABLET | Freq: Once | ORAL | Status: AC
  Administered 2020-07-14: 10 mg via ORAL

## 2020-07-14 NOTE — Patient Instructions (Signed)
[facility]COL[facility]  Discharge Instructions: ?Thank you for choosing [facility] to provide your oncology and hematology care.  ? ?If you have a lab appointment with the Cancer Center, please go directly to the Cancer Center and check in at the registration area. ?  ?Wear comfortable clothing and clothing appropriate for easy access to any Portacath or PICC line.  ? ?We strive to give you quality time with your provider. You may need to reschedule your appointment if you arrive late (15 or more minutes).  Arriving late affects you and other patients whose appointments are after yours.  Also, if you miss three or more appointments without notifying the office, you may be dismissed from the clinic at the provider?s discretion.    ?  ?For prescription refill requests, have your pharmacy contact our office and allow 72 hours for refills to be completed.   ? ?Today you received the following chemotherapy and/or immunotherapy agents: Etoposide.     ?  ?To help prevent nausea and vomiting after your treatment, we encourage you to take your nausea medication as directed. ? ?BELOW ARE SYMPTOMS THAT SHOULD BE REPORTED IMMEDIATELY: ?*FEVER GREATER THAN 100.4 F (38 ?C) OR HIGHER ?*CHILLS OR SWEATING ?*NAUSEA AND VOMITING THAT IS NOT CONTROLLED WITH YOUR NAUSEA MEDICATION ?*UNUSUAL SHORTNESS OF BREATH ?*UNUSUAL BRUISING OR BLEEDING ?*URINARY PROBLEMS (pain or burning when urinating, or frequent urination) ?*BOWEL PROBLEMS (unusual diarrhea, constipation, pain near the anus) ?TENDERNESS IN MOUTH AND THROAT WITH OR WITHOUT PRESENCE OF ULCERS (sore throat, sores in mouth, or a toothache) ?UNUSUAL RASH, SWELLING OR PAIN  ?UNUSUAL VAGINAL DISCHARGE OR ITCHING  ? ?Items with * indicate a potential emergency and should be followed up as soon as possible or go to the Emergency Department if any problems should occur. ? ?Please show the CHEMOTHERAPY ALERT CARD or IMMUNOTHERAPY ALERT CARD at check-in  to the Emergency Department and triage nurse. ? ?Should you have questions after your visit or need to cancel or reschedule your appointment, please contact [facility]  Dept: [phone]  and follow the prompts.  Office hours are 8:00 a.m. to 4:30 p.m. Monday - Friday. Please note that voicemails left after 4:00 p.m. may not be returned until the following business day.  We are closed weekends and major holidays. You have access to a nurse at all times for urgent questions. Please call the main number to the clinic Dept: [phone] and follow the prompts. ? ? ?For any non-urgent questions, you may also contact your provider using MyChart. We now offer e-Visits for anyone 18 and older to request care online for non-urgent symptoms. For details visit [email or web address]. ?  ?Also download the MyChart app! Go to the app store, search "MyChart", open the app, select [facility], and log in with your MyChart username and password. ? ?Due to Covid, a mask is required upon entering the hospital/clinic. If you do not have a mask, one will be given to you upon arrival. For doctor visits, patients may have 1 support person aged 18 or older with them. For treatment visits, patients cannot have anyone with them due to current Covid guidelines and our immunocompromised population.  ? ?

## 2020-07-15 ENCOUNTER — Inpatient Hospital Stay: Payer: Medicare HMO

## 2020-07-15 ENCOUNTER — Other Ambulatory Visit: Payer: Self-pay

## 2020-07-15 DIAGNOSIS — C8330 Diffuse large B-cell lymphoma, unspecified site: Secondary | ICD-10-CM

## 2020-07-15 DIAGNOSIS — Z5111 Encounter for antineoplastic chemotherapy: Secondary | ICD-10-CM | POA: Diagnosis not present

## 2020-07-15 DIAGNOSIS — Z7189 Other specified counseling: Secondary | ICD-10-CM

## 2020-07-15 DIAGNOSIS — Z5112 Encounter for antineoplastic immunotherapy: Secondary | ICD-10-CM | POA: Diagnosis not present

## 2020-07-15 DIAGNOSIS — Z5189 Encounter for other specified aftercare: Secondary | ICD-10-CM | POA: Diagnosis not present

## 2020-07-15 DIAGNOSIS — C8335 Diffuse large B-cell lymphoma, lymph nodes of inguinal region and lower limb: Secondary | ICD-10-CM | POA: Diagnosis not present

## 2020-07-15 DIAGNOSIS — Z79899 Other long term (current) drug therapy: Secondary | ICD-10-CM | POA: Diagnosis not present

## 2020-07-15 MED ORDER — SODIUM CHLORIDE 0.9% FLUSH
10.0000 mL | INTRAVENOUS | Status: DC | PRN
  Administered 2020-07-15: 10 mL
  Filled 2020-07-15: qty 10

## 2020-07-15 MED ORDER — PROCHLORPERAZINE MALEATE 10 MG PO TABS
ORAL_TABLET | ORAL | Status: AC
  Filled 2020-07-15: qty 1

## 2020-07-15 MED ORDER — SODIUM CHLORIDE 0.9 % IV SOLN
25.0000 mg/m2 | Freq: Once | INTRAVENOUS | Status: AC
  Administered 2020-07-15: 50 mg via INTRAVENOUS
  Filled 2020-07-15: qty 2.5

## 2020-07-15 MED ORDER — PROCHLORPERAZINE MALEATE 10 MG PO TABS
10.0000 mg | ORAL_TABLET | Freq: Once | ORAL | Status: AC
Start: 2020-07-15 — End: 2020-07-15
  Administered 2020-07-15: 10 mg via ORAL

## 2020-07-15 MED ORDER — HEPARIN SOD (PORK) LOCK FLUSH 100 UNIT/ML IV SOLN
500.0000 [IU] | Freq: Once | INTRAVENOUS | Status: AC | PRN
  Administered 2020-07-15: 500 [IU]
  Filled 2020-07-15: qty 5

## 2020-07-15 MED ORDER — SODIUM CHLORIDE 0.9 % IV SOLN
Freq: Once | INTRAVENOUS | Status: AC
  Filled 2020-07-15: qty 250

## 2020-07-15 NOTE — Progress Notes (Signed)
Per pt. he took prednisone 100 mg po this am before appt.

## 2020-07-15 NOTE — Patient Instructions (Signed)
[facility] ONCOLOGY  Discharge Instructions: Thank you for choosing [facility] to provide your oncology and hematology care.   If you have a lab appointment with the [facility], please go directly to the [facility] and check in at the registration area.   Wear comfortable clothing and clothing appropriate for easy access to any Portacath or PICC line.   We strive to give you quality time with your provider. You may need to reschedule your appointment if you arrive late (15 or more minutes).  Arriving late affects you and other patients whose appointments are after yours.  Also, if you miss three or more appointments without notifying the office, you may be dismissed from the clinic at the provider's discretion.      For prescription refill requests, have your pharmacy contact our office and allow 72 hours for refills to be completed.    Today you received the following chemotherapy and/or immunotherapy agent: Etoposide (Vepesid)     To help prevent nausea and vomiting after your treatment, we encourage you to take your nausea medication as directed.  BELOW ARE SYMPTOMS THAT SHOULD BE REPORTED IMMEDIATELY: *FEVER GREATER THAN 100.4 F (38 C) OR HIGHER *CHILLS OR SWEATING *NAUSEA AND VOMITING THAT IS NOT CONTROLLED WITH YOUR NAUSEA MEDICATION *UNUSUAL SHORTNESS OF BREATH *UNUSUAL BRUISING OR BLEEDING *URINARY PROBLEMS (pain or burning when urinating, or frequent urination) *BOWEL PROBLEMS (unusual diarrhea, constipation, pain near the anus) TENDERNESS IN MOUTH AND THROAT WITH OR WITHOUT PRESENCE OF ULCERS (sore throat, sores in mouth, or a toothache) UNUSUAL RASH, SWELLING OR PAIN  UNUSUAL VAGINAL DISCHARGE OR ITCHING   Items with * indicate a potential emergency and should be followed up as soon as possible or go to the Emergency Department if any problems should occur.  Please show the CHEMOTHERAPY ALERT CARD or IMMUNOTHERAPY ALERT CARD at  check-in to the Emergency Department and triage nurse.  Should you have questions after your visit or need to cancel or reschedule your appointment, please contact [facility]  Dept: [phone]  and follow the prompts.  Office hours are 8:00 a.m. to 4:30 p.m. Monday - Friday. Please note that voicemails left after 4:00 p.m. may not be returned until the following business day.  We are closed weekends and major holidays. You have access to a nurse at all times for urgent questions. Please call the main number to the clinic Dept: [phone] and follow the prompts.   For any non-urgent questions, you may also contact your provider using MyChart. We now offer e-Visits for anyone 68 and older to request care online for non-urgent symptoms. For details visit [email or web address].   Also download the MyChart app! Go to the app store, search "MyChart", open the app, select [facility], and log in with your MyChart username and password.  Due to Covid, a mask is required upon entering the hospital/clinic. If you do not have a mask, one will be given to you upon arrival. For doctor visits, patients may have 1 support person aged 57 or older with them. For treatment visits, patients cannot have anyone with them due to current Covid guidelines and our immunocompromised population.

## 2020-07-17 ENCOUNTER — Other Ambulatory Visit: Payer: Self-pay

## 2020-07-17 ENCOUNTER — Telehealth: Payer: Self-pay | Admitting: *Deleted

## 2020-07-17 ENCOUNTER — Inpatient Hospital Stay: Payer: Medicare HMO

## 2020-07-17 VITALS — BP 125/77 | HR 88 | Temp 98.0°F | Resp 18

## 2020-07-17 DIAGNOSIS — C8330 Diffuse large B-cell lymphoma, unspecified site: Secondary | ICD-10-CM

## 2020-07-17 DIAGNOSIS — Z7189 Other specified counseling: Secondary | ICD-10-CM

## 2020-07-17 DIAGNOSIS — Z5189 Encounter for other specified aftercare: Secondary | ICD-10-CM | POA: Diagnosis not present

## 2020-07-17 DIAGNOSIS — C8335 Diffuse large B-cell lymphoma, lymph nodes of inguinal region and lower limb: Secondary | ICD-10-CM | POA: Diagnosis not present

## 2020-07-17 DIAGNOSIS — Z5111 Encounter for antineoplastic chemotherapy: Secondary | ICD-10-CM | POA: Diagnosis not present

## 2020-07-17 DIAGNOSIS — Z5112 Encounter for antineoplastic immunotherapy: Secondary | ICD-10-CM | POA: Diagnosis not present

## 2020-07-17 DIAGNOSIS — Z79899 Other long term (current) drug therapy: Secondary | ICD-10-CM | POA: Diagnosis not present

## 2020-07-17 MED ORDER — PEGFILGRASTIM-CBQV 6 MG/0.6ML ~~LOC~~ SOSY
6.0000 mg | PREFILLED_SYRINGE | Freq: Once | SUBCUTANEOUS | Status: AC
  Administered 2020-07-17: 6 mg via SUBCUTANEOUS

## 2020-07-17 MED ORDER — PEGFILGRASTIM-CBQV 6 MG/0.6ML ~~LOC~~ SOSY
PREFILLED_SYRINGE | SUBCUTANEOUS | Status: AC
  Filled 2020-07-17: qty 0.6

## 2020-07-17 NOTE — Patient Instructions (Signed)

## 2020-07-17 NOTE — Telephone Encounter (Signed)
Received vm call from pt asking for return call.  Called pt & he asked about going out in public & what recommendations were. Discussed avoiding large crowds in small enclosed areas, good handwashing/hand sanitizers, wearing mask wherever he goes, keeping hands out of face, & just being more cautious when counts may be lower.  Suggested best time to be out would be right before of after his treatment when counts are usually better.  Pt expressed understanding.

## 2020-07-20 ENCOUNTER — Other Ambulatory Visit: Payer: Self-pay

## 2020-07-20 DIAGNOSIS — Z951 Presence of aortocoronary bypass graft: Secondary | ICD-10-CM

## 2020-07-20 NOTE — Progress Notes (Signed)
Daily Session Note  Patient Details  Name: TANOR GLASPY MRN: [id number] Date of Birth: 03-03-1945 Referring Provider:   Flowsheet Row Cardiac Rehab from 05/04/2020 in [facility] Cardiac [facility]  Referring Provider [name] MD       Encounter Date: 07/20/2020  Check In:  Session Check In - 07/20/20 1410       Check-In   Supervising physician immediately available to respond to emergencies See telemetry face sheet for immediately available ER MD    Location [facility]-Cardiac & Pulmonary Rehab    Staff Present Birdie Sons, MPA, Mauricia Area, BS, ACSM CEP, Exercise Physiologist;Kara Eliezer Bottom, MS, ASCM CEP, Exercise Physiologist    Virtual Visit No    Medication changes reported     No    Fall or balance concerns reported    No    Warm-up and Cool-down Performed on first and last piece of equipment    Resistance Training Performed Yes    VAD Patient? No    PAD/SET Patient? No      Pain Assessment   Currently in Pain? No/denies                Social History   Tobacco Use  Smoking Status Former   Pack years: 0.00   Types: Pipe   Quit date: 11/29/1976   Years since quitting: 43.6  Smokeless Tobacco Never    Goals Met:  Independence with exercise equipment Exercise tolerated well No report of cardiac concerns or symptoms Strength training completed today  Goals Unmet:  Not Applicable  Comments: Pt able to follow exercise prescription today without complaint.  Will continue to monitor for progression.    Dr. Emily Filbert is Medical Director for [facility].  Dr. Ottie Glazier is Medical Director for [facility] Pulmonary Rehabilitation.

## 2020-07-22 ENCOUNTER — Telehealth: Payer: Self-pay | Admitting: Hematology

## 2020-07-22 ENCOUNTER — Other Ambulatory Visit: Payer: Self-pay

## 2020-07-22 DIAGNOSIS — Z951 Presence of aortocoronary bypass graft: Secondary | ICD-10-CM

## 2020-07-22 NOTE — Progress Notes (Signed)
Daily Session Note  Patient Details  Name: Roy [name] MRN: [id number] Date of Birth: 1945/05/18 Referring Provider:   Flowsheet Row Cardiac Rehab from 05/04/2020 in [facility] Card[facility] Rehab  Referring Provider [name] MD       Encounter Date: 07/22/2020  Check In:  Session Check In - 07/22/20 1458       Check-In   Supervising physician immediately available to respond to emergencies See telemetry face sheet for immediately available ER MD    Location [facility]-Cardiac & Pulmonary Rehab    Staff Present Birdie Sons, MPA, Nino Glow, MS, ASCM CEP, Exercise Physiologist;Joseph Tessie Fass, RCP,RRT,BSRT;Kristen Coble, RN,BC,MSN    Virtual Visit No    Medication changes reported     No    Fall or balance concerns reported    No    Warm-up and Cool-down Performed on first and last piece of equipment    Resistance Training Performed Yes    VAD Patient? No    PAD/SET Patient? No      Pain Assessment   Currently in Pain? No/denies                Social History   Tobacco Use  Smoking Status Former   Pack years: 0.00   Types: Pipe   Quit date: 11/29/1976   Years since quitting: 43.6  Smokeless Tobacco Never    Goals Met:  Independence with exercise equipment Exercise tolerated well No report of cardiac concerns or symptoms Strength training completed today  Goals Unmet:  Not Applicable  Comments: Pt able to follow exercise prescription today without complaint.  Will continue to monitor for progression.    Dr. Emily Filbert is Medical Director for [facility].  Dr. Ottie Glazier is Medical Director for [facility] Pulmonary Rehabilitation.

## 2020-07-22 NOTE — Telephone Encounter (Signed)
Rescheduled upcoming appointment due to provider not in office. Patient is aware of changes. 

## 2020-07-23 ENCOUNTER — Other Ambulatory Visit: Payer: Self-pay | Admitting: Adult Health

## 2020-07-23 ENCOUNTER — Other Ambulatory Visit (HOSPITAL_COMMUNITY): Payer: Self-pay | Admitting: Adult Health

## 2020-07-23 ENCOUNTER — Ambulatory Visit (HOSPITAL_COMMUNITY): Payer: Medicare HMO

## 2020-07-23 ENCOUNTER — Other Ambulatory Visit: Payer: Self-pay

## 2020-07-23 ENCOUNTER — Ambulatory Visit (HOSPITAL_COMMUNITY)
Admission: RE | Admit: 2020-07-23 | Discharge: 2020-07-23 | Disposition: A | Discharge status: Home / Self Care | Payer: Medicare HMO | Source: Ambulatory Visit | Attending: Adult Health | Admitting: Adult Health

## 2020-07-23 ENCOUNTER — Inpatient Hospital Stay: Payer: Medicare HMO

## 2020-07-23 DIAGNOSIS — Z5189 Encounter for other specified aftercare: Secondary | ICD-10-CM | POA: Diagnosis not present

## 2020-07-23 DIAGNOSIS — R102 Pelvic and perineal pain: Secondary | ICD-10-CM | POA: Diagnosis not present

## 2020-07-23 DIAGNOSIS — Z79899 Other long term (current) drug therapy: Secondary | ICD-10-CM | POA: Diagnosis not present

## 2020-07-23 DIAGNOSIS — N50811 Right testicular pain: Secondary | ICD-10-CM | POA: Insufficient documentation

## 2020-07-23 DIAGNOSIS — C8335 Diffuse large B-cell lymphoma, lymph nodes of inguinal region and lower limb: Secondary | ICD-10-CM | POA: Diagnosis not present

## 2020-07-23 DIAGNOSIS — Z5111 Encounter for antineoplastic chemotherapy: Secondary | ICD-10-CM | POA: Diagnosis not present

## 2020-07-23 DIAGNOSIS — Z5112 Encounter for antineoplastic immunotherapy: Secondary | ICD-10-CM | POA: Diagnosis not present

## 2020-07-23 DIAGNOSIS — C8330 Diffuse large B-cell lymphoma, unspecified site: Secondary | ICD-10-CM

## 2020-07-23 DIAGNOSIS — C6212 Malignant neoplasm of descended left testis: Secondary | ICD-10-CM | POA: Diagnosis not present

## 2020-07-23 DIAGNOSIS — Z95828 Presence of other vascular implants and grafts: Secondary | ICD-10-CM

## 2020-07-23 DIAGNOSIS — Z9079 Acquired absence of other genital organ(s): Secondary | ICD-10-CM | POA: Diagnosis not present

## 2020-07-23 LAB — CMP (CANCER CENTER ONLY)
ALT: 30 U/L (ref 0–44)
AST: 14 U/L — ABNORMAL LOW (ref 15–41)
Albumin: 3.6 g/dL (ref 3.5–5.0)
Alkaline Phosphatase: 107 U/L (ref 38–126)
Anion gap: 11 (ref 5–15)
BUN: 10 mg/dL (ref 8–23)
CO2: 25 mmol/L (ref 22–32)
Calcium: 8.5 mg/dL — ABNORMAL LOW (ref 8.9–10.3)
Chloride: 106 mmol/L (ref 98–111)
Creatinine: 1.23 mg/dL (ref 0.61–1.24)
GFR, Estimated: >60 mL/min (ref >60)
Glucose, Bld: 175 mg/dL — ABNORMAL HIGH (ref 70–99)
Potassium: 3.3 mmol/L — ABNORMAL LOW (ref 3.5–5.1)
Sodium: 142 mmol/L (ref 135–145)
Total Bilirubin: 0.9 mg/dL (ref 0.3–1.2)
Total Protein: 7 g/dL (ref 6.5–8.1)

## 2020-07-23 LAB — CBC WITH DIFFERENTIAL (CANCER CENTER ONLY)
Abs Immature Granulocytes: 0.72 10*3/uL — ABNORMAL HIGH (ref 0.00–0.07)
Basophils Absolute: 0.1 10*3/uL (ref 0.0–0.1)
Basophils Relative: 1 %
Eosinophils Absolute: 0.1 10*3/uL (ref 0.0–0.5)
Eosinophils Relative: 0 %
HCT: 32.2 % — ABNORMAL LOW (ref 39.0–52.0)
Hemoglobin: 10.2 g/dL — ABNORMAL LOW (ref 13.0–17.0)
Immature Granulocytes: 3 %
Lymphocytes Relative: 5 %
Lymphs Abs: 1.1 10*3/uL (ref 0.7–4.0)
MCH: 27 pg (ref 26.0–34.0)
MCHC: 31.7 g/dL (ref 30.0–36.0)
MCV: 85.2 fL (ref 80.0–100.0)
Monocytes Absolute: 1.9 10*3/uL — ABNORMAL HIGH (ref 0.1–1.0)
Monocytes Relative: 8 %
Neutro Abs: 18.9 10*3/uL — ABNORMAL HIGH (ref 1.7–7.7)
Neutrophils Relative %: 83 %
Platelet Count: 259 10*3/uL (ref 150–400)
RBC: 3.78 MIL/uL — ABNORMAL LOW (ref 4.22–5.81)
RDW: 16.2 % — ABNORMAL HIGH (ref 11.5–15.5)
WBC Count: 22.9 10*3/uL — ABNORMAL HIGH (ref 4.0–10.5)
nRBC: 0.1 % (ref 0.0–0.2)

## 2020-07-23 LAB — URIC ACID: Uric Acid, Serum: 7 mg/dL (ref 3.7–8.6)

## 2020-07-23 LAB — LACTATE DEHYDROGENASE: LDH: 199 U/L — ABNORMAL HIGH (ref 98–192)

## 2020-07-23 IMAGING — US US SCROTUM W/ DOPPLER COMPLETE
1 series · 15 of 25 positions shown · non-contrast
Comparison: [DATE]

CLINICAL DATA: Right testicle pain

EXAM:
SCROTAL ULTRASOUND
DOPPLER ULTRASOUND OF THE TESTICLES
TECHNIQUE: Complete ultrasound examination of the testicles, epididymis, and
other scrotal structures was performed. Color and spectral Doppler
ultrasound were also utilized to evaluate blood flow to the
testicles.

[Series 1: us art/ven flow abd pelv doppl mc & wl · 48 acquisitions, 15 frames shown]
[im 1/48]
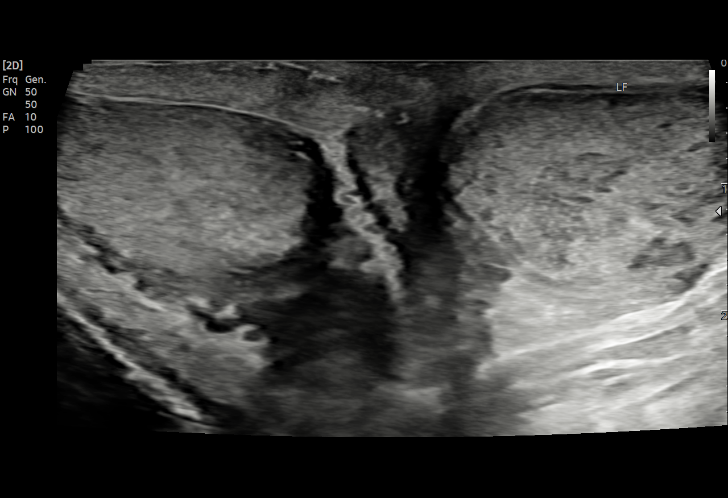
[im 4/48]
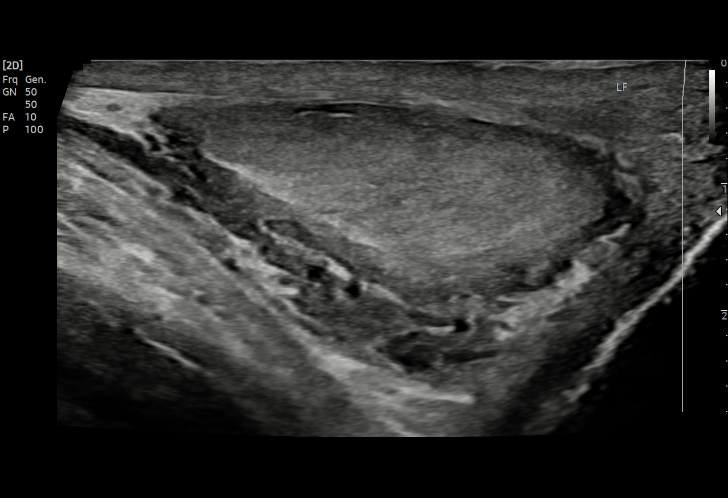
[im 8/48]
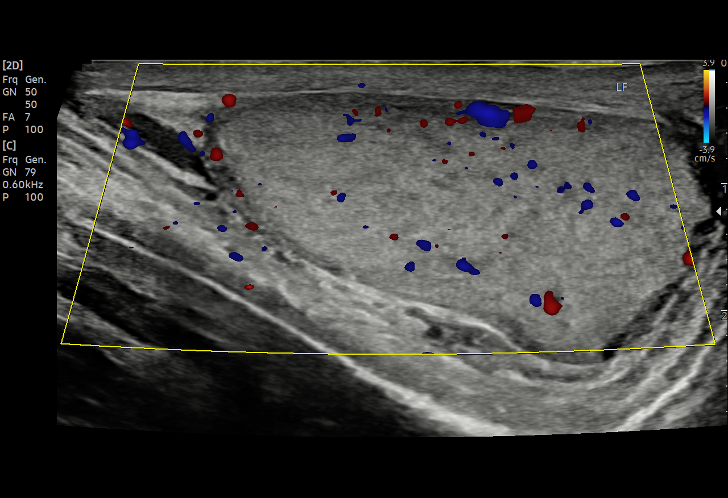
[im 10/48]
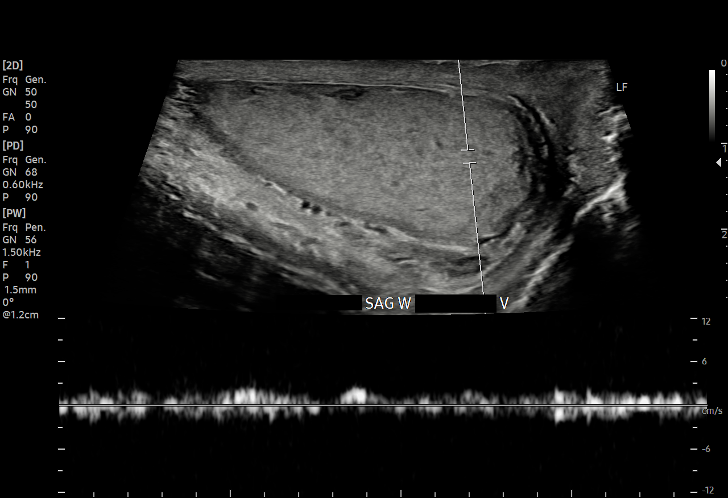
[im 14/48]
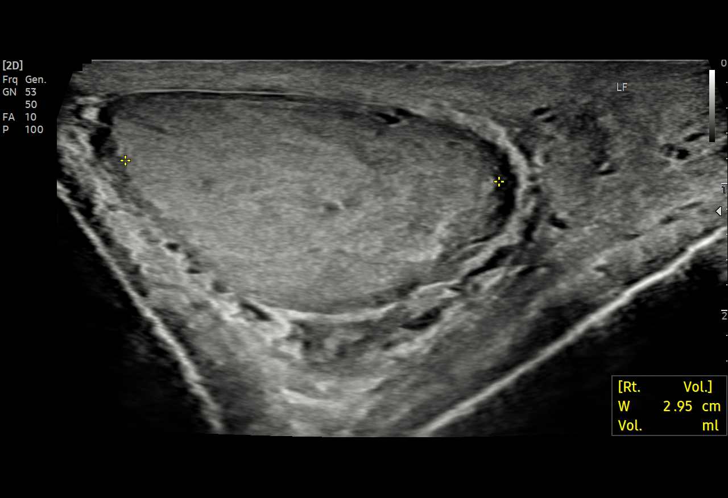
[im 18/48]
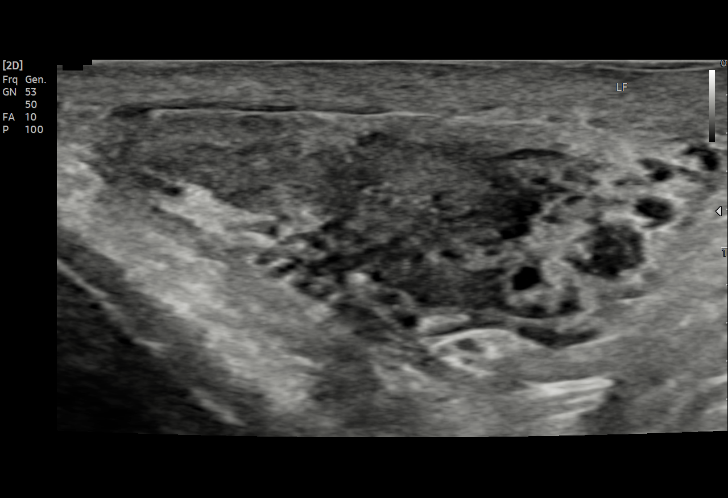
[im 20/48]
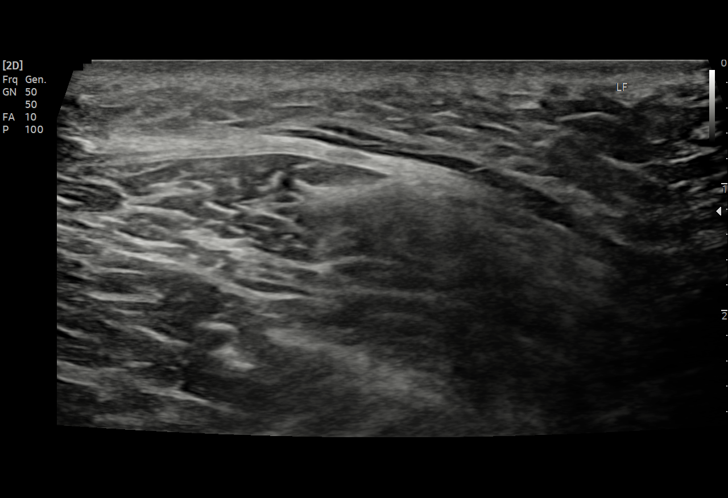
[im 24/48]
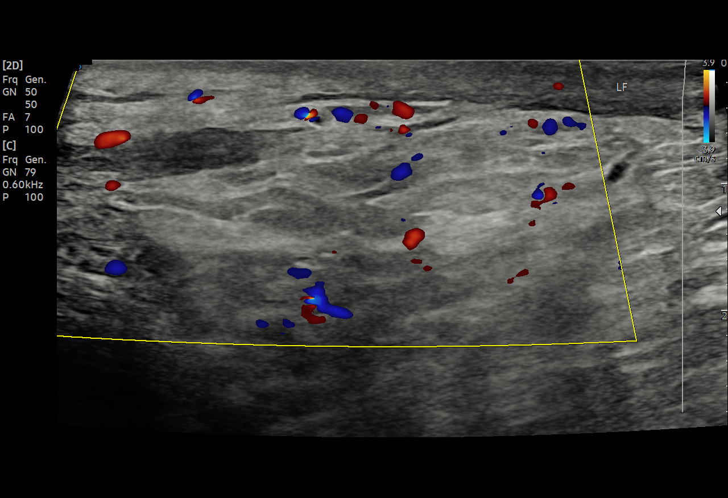
[im 28/48]
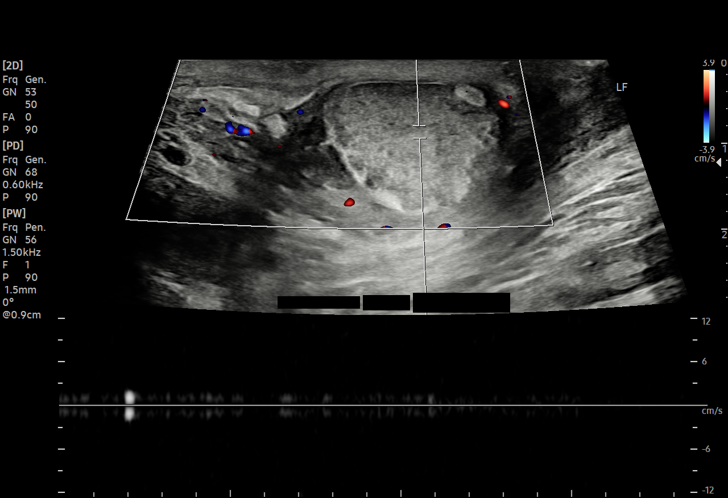
[im 30/48]
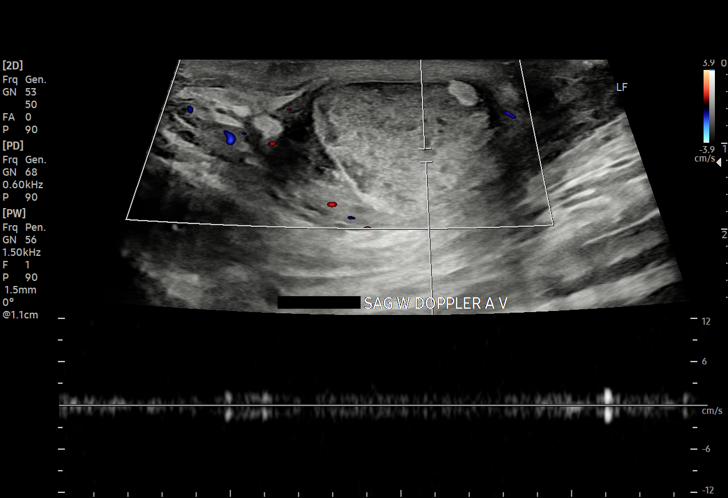
[im 34/48]
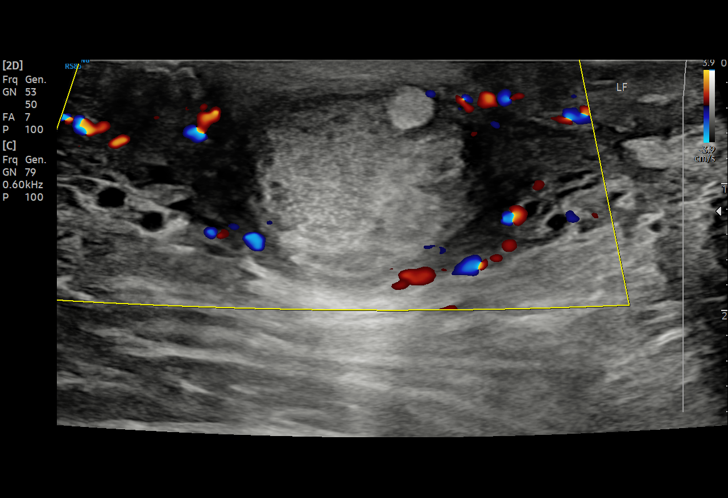
[im 38/48]
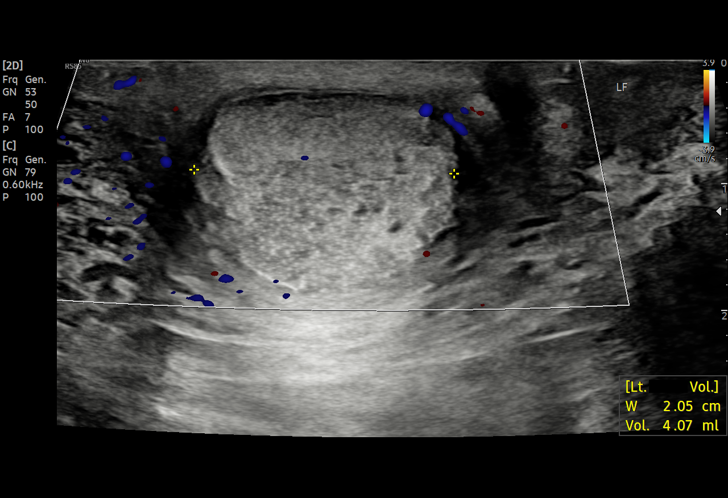
[im 40/48]
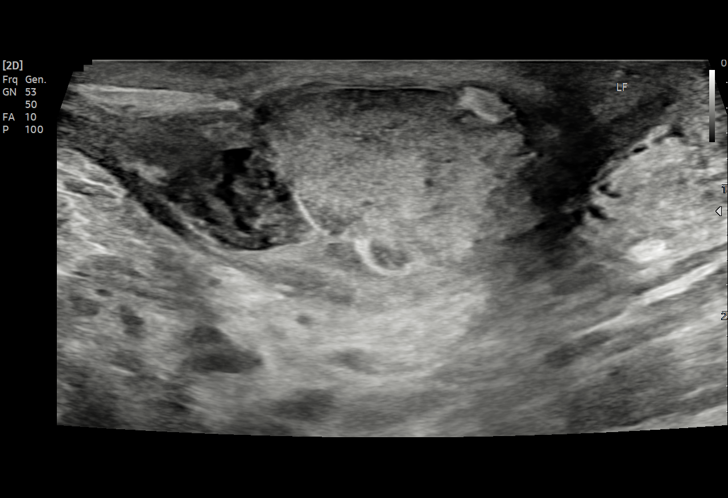
[im 44/48]
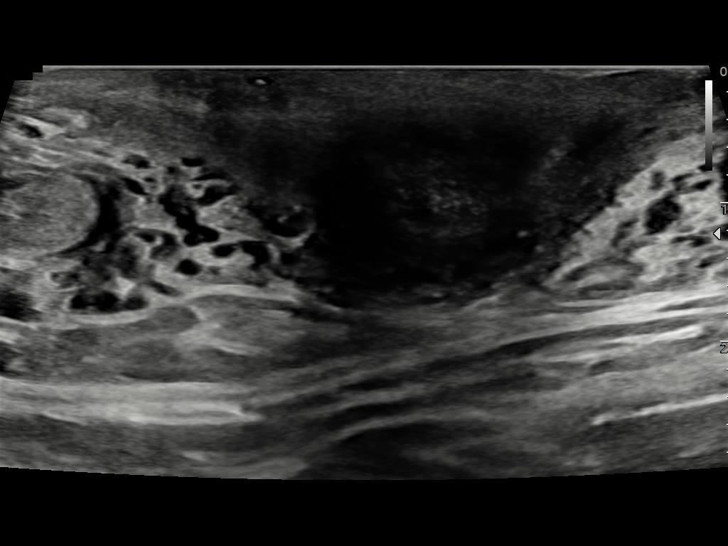
[im 48/48]
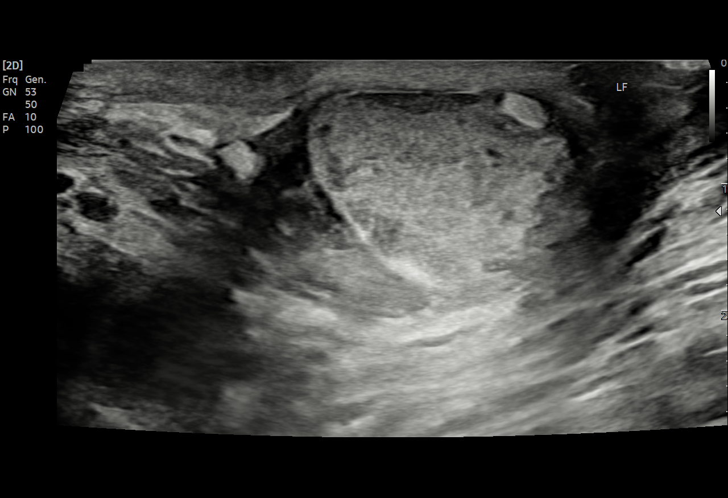

[15 of 25 positions shown; findings below may reference images not displayed]

FINDINGS: Right testicle

Measurements: 4.2 x 1.7 x 3.0 cm. No mass or microlithiasis
visualized.

Left testicle

Measurements:  Status post left orchiectomy.

Right epididymis:  Normal in size and appearance.

Left epididymis:  Not visualized

Hydrocele:  None visualized.

Varicocele:  None visualized.

Pulsed Doppler interrogation of both testes demonstrates normal low
resistance arterial and venous waveforms bilaterally.

Other: There is a solid echogenic mass with a 3.9 cm peripheral
echogenic nodule measuring 2.4 x 1.6 x 2.1 cm. No significant
increased blood flow identified within this structure.
IMPRESSION: 1. No signs of right testicular mass or torsion.
2. Indeterminate, solid echogenic mass within the left hemiscrotum
status post left orchiectomy for lymphoma. Primary differential
considerations include residual postoperative change versus
recurrent lymphoma. Contrast enhanced MRI of the scrotum may be
helpful to differentiate if clinically indicated.

## 2020-07-23 MED ORDER — SODIUM CHLORIDE 0.9% FLUSH
10.0000 mL | Freq: Once | INTRAVENOUS | Status: AC
  Administered 2020-07-23: 10 mL
  Filled 2020-07-23: qty 10

## 2020-07-23 MED ORDER — HEPARIN SOD (PORK) LOCK FLUSH 100 UNIT/ML IV SOLN
500.0000 [IU] | Freq: Once | INTRAVENOUS | Status: AC
  Administered 2020-07-23: 500 [IU]
  Filled 2020-07-23: qty 5

## 2020-07-23 NOTE — Progress Notes (Signed)
[name]    HEMATOLOGY/ONCOLOGY CLINIC NOTE  Date of Service: 07/24/2020  Patient Care Team: [name], DO as PCP - General (Family Medicine) O'Neal, [name], MD as PCP - Cardiology (Cardiology)  CHIEF COMPLAINTS/PURPOSE OF CONSULTATION:  Primary testicular large B cell lymphoma  HISTORY OF PRESENTING ILLNESS:   Roy [name] is a wonderful 75 y.o. male who has been referred to [place] by Dr Maudie Mercury for evaluation and management of primary testicular large B-cell lymphoma  Patient has a history of hypertension, borderline diabetes, previous nonischemic cardiomyopathy but with a recent MI,, CHF who presented with a left testicular swelling end of sept/ early oct 2021.  He notes that he was seen by Urology - Dr Minette Brine and had an ultrasound--possible infection was suspected and he received 2 rounds of abx without significant improvement.  He notes he received 2 subsequent follow-up ultrasounds in December in January and finally had left transinguinal orchiectomy on 03/11/2020. He notes he had persistent testicular swelling post operatively which required drainage.  He also notes having issues with urinary retention and had a urinary catheter for 2 weeks, abx+  Pathology showed primary testicular large B-cell lymphoma.  No other additional imaging studies for staging have been done at this time.  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  Patient was admitted to the hospital on 03/21/2020 with acute onset dyspnea epigastric pain and cough with pink frothy sputum.  He was noted to have hypoxia with oxygen saturation of 82% on arrival.  He was noted to have non-ST elevation MI on 2/26.  CTA chest ruled out PE.  His symptoms from fluid with aggressive diuresis.  He was noted to have severe triple-vessel disease on cath with an ejection fraction of 25 to 35% with no significant valvular lesions.  He subsequently had CORONARY ARTERY BYPASS  GRAFTING (CABG), ON PUMP, TIMES FOUR, USING BILATERAL INTERNAL MAMMARY ARTERIES AND LEFT RADIAL ARTERY .. On March 25, 2020.  Patient notes he is gradually recovering from his cardiac surgery notes being fatigued having gone through multiple urologic issues, skin surgery for squamous of carcinoma of the nose, acute MI CABG x4.  Patient notes no fevers no chills no night sweats. He notes he might have lost some weight but difficult to quantify in the setting of fluid overload and diuresis. Notes he is starting to eat a little better. Minimal left scrotal swelling. No other acute new focal symptoms.  INTERVAL HISTORY I connected with Roy [name] on 07/24/2020 by telephone and verified that I am speaking with the correct person using two identifiers.   I discussed the limitations of evaluation and management by telemedicine. The patient expressed understanding and agreed to proceed.   Other persons participating in the visit and their role in the encounter:                                                         - Reinaldo Raddle, Medical Scribe     Patient's location: Home Provider's location: Home  Roy [name] is a wonderful 75 y.o. male who is here today for evaluation and management of Primary testicular large B cell lymphoma. The patient's last visit with [place] was on 06/23/2020. The pt reports that he is doing well overall. He  is scheduled for C2 R-CEOP on 07/11.   The pt reports that he is doing well with no issues related to his first cycle of R-CEOP. He has not had the typical pains from the GF shot and notes no concerns over his treatment. He notes he has not been taking any potassium supplementation as on his medication list. The patient notes no overt or worsening fatigue due to treatment. He continues to go to the cardiac rehab.   The pt notes he has been having some issues related to his scrotum surgery. They did an [place] yesterday and will be getting an MRI soon. The US revealed there  may be a solid mass on the left side where testes were removed and want to observe if this is scar tissue or recurrent lymphoma.  Lab results 07/23/2020 of CBC w/diff and CMP is as follows: all values are WNL except for WBC of 22.9K, RBC of 3.78, Hgb of 10.2, HCT of 32.2, RDW of 18.9, Potassium of 3.3, Glucose of 175, Calcium of 8.5, AST of 14, Monocytes abs of 1.9K. 07/23/2020 Uric acid of 7.0. 07/23/2020 LDH of 199.  On review of systems, pt reports chronic mild fatigue and denies nausea, diarrhea, fevers, chills, skin rashes, acute SOB, vomiting, bone pains, and any other symptoms.   MEDICAL HISTORY:  Past Medical History:  Diagnosis Date   Allergic rhinitis    Arthritis    wrists   Cardiomyopathy, nonischemic [facility])    followed by cardiology--- dr [name]---  2016 ef 45-50% ,  2016 nuclear ef 37%,  2017 per echo ef 50-55%   CHF (congestive heart failure), NYHA class III ([place]) 03/21/2020   Coronary artery disease    GERD (gastroesophageal reflux disease)    Hiatal hernia    History of kidney stones    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;   01/ 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   History of syncope (03-06-2020 pt stated has not had sycopal episode in few years, stated it seems to happen in extreme hot conditions)   cardiologist--- dr [name]--- dx recurrent syncope;  nuclear study 11-03-2014 intermediate risk w/ no ischemia, apical hypokinesis, nuclear ef 37%;  event monitor-- 12-21-2015 SB/ ST  no pauses/ arrythmia's;  echo 08-03-2015 ef 50-55%   Hyperlipidemia    Hypertension    followed by pcp   Hypovitaminosis D    Mass of left testicle    Nocturia    Plantar fasciitis    Presence of surgical incision    01/ 2022  moh's w/ skin graft of nose, per pt still healing and wear bandage daily   Type 2 diabetes mellitus ([place])    pt is adament that he is not and have been told he is a diabetic but a borderline;  followed by pcp, in pcp note  states DM2 and takes 2 meds daily   Wears glasses    Wears hearing aid in both ears     SURGICAL HISTORY: Past Surgical History:  Procedure Laterality Date   COLONOSCOPY  last one 01-30-2017   CORONARY ARTERY BYPASS GRAFT N/A 03/25/2020   Procedure: CORONARY ARTERY BYPASS GRAFTING (CABG), ON PUMP, TIMES FOUR, USING BILATERAL INTERNAL MAMMARY ARTERIES AND LEFT RADIAL ARTERY;  Surgeon: [name], MD;  Location: [place];  Service: Open Heart Surgery;  Laterality: N/A;   IR IMAGING GUIDED PORT INSERTION  07/06/2020   LOW ANTERIOR RESECTION RECTUM W/ COLOPROCTOSTOMY  05/2003   MOHS SURGERY  01/2020   nose w/ graft   ORCHIECTOMY Left 03/11/2020   Procedure: [place];  Surgeon: [name], MD;  Location: [facility];  Service: Urology;  Laterality: Left;  ONLY NEEDS 60 MIN   RADIAL ARTERY HARVEST Left 03/25/2020   Procedure: LEFT RADIAL ARTERY HARVEST;  Surgeon: [name], MD;  Location: [place];  Service: Open Heart Surgery;  Laterality: Left;   RIGHT/LEFT HEART CATH AND CORONARY ANGIOGRAPHY N/A 03/23/2020   Procedure: RIGHT/LEFT HEART CATH AND CORONARY ANGIOGRAPHY;  Surgeon: [name], MD;  Location: [facility];  Service: Cardiovascular;  Laterality: N/A;   SHOULDER SURGERY Right 1992; 07/ 2021   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 Left 12/24/2008   left ear and nose    TEE WITHOUT CARDIOVERSION N/A 03/25/2020   Procedure: TRANSESOPHAGEAL ECHOCARDIOGRAM (TEE);  Surgeon: [name], MD;  Location: [place];  Service: Open Heart Surgery;  Laterality: N/A;   TONSILLECTOMY AND ADENOIDECTOMY  child   UPPER GASTROINTESTINAL ENDOSCOPY  last one 06-06-2017    SOCIAL HISTORY: Social History   Socioeconomic History   Marital status: Married    Spouse name: Not on file   Number of children: Not on file   Years of education: Not on file   Highest education level: Not on file  Occupational History   Not on file   Tobacco Use   Smoking status: Former    Pack years: 0.00    Types: Pipe    Quit date: 11/29/1976    Years since quitting: 43.6   Smokeless tobacco: Never  Vaping Use   Vaping Use: Never used  Substance and Sexual Activity   Alcohol use: Yes    Alcohol/week: 0.0 standard drinks    Comment: Occasional drink    Drug use: Never   Sexual activity: Not on file  Other Topics Concern   Not on file  Social History Narrative   Not on file   Social Determinants of Health   Financial Resource Strain: Not on file  Food Insecurity: Not on file  Transportation Needs: Not on file  Physical Activity: Not on file  Stress: Not on file  Social Connections: Not on file  Intimate Partner Violence: Not on file    FAMILY HISTORY: Family History  Problem Relation Age of Onset   Heart attack Mother    Stroke Father    Colon cancer Neg Hx    Esophageal cancer Neg Hx    Pancreatic cancer Neg Hx    Rectal cancer Neg Hx    Stomach cancer Neg Hx    Colon polyps Neg Hx     ALLERGIES:  has No Known Allergies.  MEDICATIONS:  Current Outpatient Medications  Medication Sig Dispense Refill   acetaminophen (TYLENOL) 325 MG tablet Take 2 tablets (650 mg total) by mouth every 4 (four) hours as needed for headache or mild pain.     Apoaequorin (PREVAGEN PO) Take 1 tablet by mouth at bedtime.     aspirin EC 81 MG tablet Take 81 mg by mouth daily. Swallow whole.     atorvastatin (LIPITOR) 40 MG tablet Take 1 tablet (40 mg total) by mouth daily. 90 tablet 3   cholecalciferol (VITAMIN D) 1000 UNITS tablet Take 1,000 Units by mouth See admin instructions. Mon-friday     empagliflozin (JARDIANCE) 10 MG TABS tablet Take 1 tablet (10 mg total) by mouth daily before breakfast. 90 tablet 1  furosemide (LASIX) 80 MG tablet Take 0.5 tablets (40 mg total) by mouth daily. 30 tablet 11   glucosamine-chondroitin 500-400 MG tablet Take 1 tablet by mouth. 5 times weekly     LORazepam (ATIVAN) 0.5 MG tablet Take 1  tablet (0.5 mg total) by mouth every 6 (six) hours as needed for anxiety. Put 1 pill under your tongue every 6 hrs, if needed, for nausea 30 tablet 0   metFORMIN (GLUCOPHAGE) 1000 MG tablet Take 1,000 mg by mouth 2 (two) times daily with a meal.      metoprolol (TOPROL XL) 200 MG 24 hr tablet Take 1 tablet (200 mg total) by mouth daily. 90 tablet 3   Multiple Vitamin (MULTIVITAMIN WITH MINERALS) TABS tablet Take 1 tablet by mouth. 5 times weekly     omeprazole (PRILOSEC) 20 MG capsule Take 20 mg by mouth daily.     ondansetron (ZOFRAN) 8 MG tablet Take 1 tablet (8 mg total) by mouth every 8 (eight) hours as needed for nausea or vomiting. Take 1 pill every 8 hrs for 3 days. 30 tablet 3   prochlorperazine (COMPAZINE) 10 MG tablet Take 1 tablet (10 mg total) by mouth every 8 (eight) hours as needed for nausea or vomiting. 40 tablet 2   sacubitril-valsartan (ENTRESTO) 24-26 MG Take 1 tablet by mouth 2 (two) times daily. 60 tablet 3   spironolactone (ALDACTONE) 25 MG tablet Take 0.5 tablets (12.5 mg total) by mouth daily. 60 tablet 1   tamsulosin (FLOMAX) 0.4 MG CAPS capsule Take 0.4 mg by mouth at bedtime.     vitamin B-12 (CYANOCOBALAMIN) 1000 MCG tablet Take 1,000 mcg by mouth. 5 times a week     Current Facility-Administered Medications  Medication Dose Route Frequency Provider Last Rate Last Admin   potassium chloride (KLOR-CON) CR tablet 20 mEq  20 mEq Oral BID [name], MD        REVIEW OF SYSTEMS:    10 Point review of Systems was done is negative except as noted above.  PHYSICAL EXAMINATION: ECOG PERFORMANCE STATUS: 2 - Symptomatic, <50% confined to bed  . There were no vitals filed for this visit.  There were no vitals filed for this visit.  .There is no height or weight on file to calculate BMI.  Telehealth Visit.  LABORATORY DATA:  I have reviewed the data as listed   CBC Latest Ref Rng & Units 07/23/2020 07/13/2020 04/27/2020  WBC 4.0 - 10.5 K/uL 22.9(H) 7.3 8.3   Hemoglobin 13.0 - 17.0 g/dL 10.2(L) 10.1(L) 10.7(L)  Hematocrit 39.0 - 52.0 % 32.2(L) 32.3(L) 33.8(L)  Platelets 150 - 400 K/uL 259 214 333    . CMP Latest Ref Rng & Units 07/23/2020 07/13/2020 06/11/2020  Glucose 70 - 99 mg/dL 175(H) 159(H) 197(H)  BUN 8 - 23 mg/dL _0 Creatinine 0.61 - 1.24 mg/dL 1.23 1.30(H) 1.43(H)  Sodium 135 - 145 mmol/L 142 140 138  Potassium 3.5 - 5.1 mmol/L 3.3(L) 3.3(L) 3.7  Chloride 98 - 111 mmol/L 106 107 99  CO2 22 - 32 mmol/L _1 Calcium 8.9 - 10.3 mg/dL 8.5(L) 8.0(L) 8.2(L)  Total Protein 6.5 - 8.1 g/dL 7.0 7.2 -  Total Bilirubin 0.3 - 1.2 mg/dL 0.9 0.8 -  Alkaline Phos 38 - 126 U/L 107 60 -  AST 15 - 41 U/L 14(L) 19 -  ALT 0 - 44 U/L 30 16 -   . Lab Results  Component Value Date   LDH 199 (  H) 07/23/2020   SURGICAL PATHOLOGY   THIS IS AN ADDENDUM REPORT   CASE: [id number]  PATIENT: Etan Hammonds  Surgical Pathology Report  Addendum    Reason for Addendum #1:  Molecular Genetic Test Results, FISH   Clinical History: Left testicular mass (crm)      FINAL MICROSCOPIC DIAGNOSIS:   A. TESTICLE, LEFT, ORCHIECTOMY:  -  Diffuse aggressive large B-cell lymphoma  -  See comment    COMMENT:   Sections of testicle show architectural effacement by sheets of large  lymphoid cells with vesicular nuclei and pale cytoplasm.  There is  admixed apoptotic debris and increased mitotic activity.  By  immunohistochemistry, the large lymphoid cells are positive for CD20,  CD5 (dim), BCL6 (dim), MUM1, and BCL2.  They are negative for CD10, CD30  (<1%), cyclin D1, and TdT.  The Ki67 proliferation index is up to  approximately 70%.  CD3 highlights small T-cells in the background. EBV  is negative by in situ hybridization.   Together, the findings support the diagnosis of a diffuse aggressive  large B-cell lymphoma. The differential diagnosis includes a diffuse  large B-cell lymphoma, NOS, with activated B-cell subtype by the [facility]   algorithm and high-grade B-cell lymphoma with MYC and BCL2 or BCL6  rearrangements.  FISH for BCL2, BCL6, and MYC rearrangements will be  performed. Correlation with clinical and radiographic findings is  recommended for consideration of a primary testicular lymphoma.   Result reported to L. Gibson on 03/16/20 at 1720 by S. O'Neill.    ADDENDUM:   FISH RESULTS:   Results: NORMAL   Interpretation:   BCL6 rearrangement:      Not Detected  MYC rearrangement:  Not Detected  MYC amplification:  Not Detected  BCL6 rearrangement:      Not Detected   Please note this testing was performed and interpreted by an outside  facility ([facility]).  This addendum is only being added to provide a  summary of the results for report completeness.  Please see electronic  medical record for a copy of the full report.   RADIOGRAPHIC STUDIES: I have personally reviewed the radiological images as listed and agreed with the findings in the report. US SCROTUM W/DOPPLER  Result Date: 07/23/2020 CLINICAL DATA:  Right testicle pain EXAM: SCROTAL ULTRASOUND DOPPLER ULTRASOUND OF THE TESTICLES TECHNIQUE: Complete ultrasound examination of the testicles, epididymis, and other scrotal structures was performed. Color and spectral Doppler ultrasound were also utilized to evaluate blood flow to the testicles. COMPARISON:  01/07/2020 FINDINGS: Right testicle Measurements: 4.2 x 1.7 x 3.0 cm. No mass or microlithiasis visualized. Left testicle Measurements:  Status post left orchiectomy. Right epididymis:  Normal in size and appearance. Left epididymis:  Not visualized Hydrocele:  None visualized. Varicocele:  None visualized. Pulsed Doppler interrogation of both testes demonstrates normal low resistance arterial and venous waveforms bilaterally. Other: There is a solid echogenic mass with a 3.9 cm peripheral echogenic nodule measuring 2.4 x 1.6 x 2.1 cm. No significant increased blood flow identified within this  structure. IMPRESSION: 1. No signs of right testicular mass or torsion. 2. Indeterminate, solid echogenic mass within the left hemiscrotum status post left orchiectomy for lymphoma. Primary differential considerations include residual postoperative change versus recurrent lymphoma. Contrast enhanced MRI of the scrotum may be helpful to differentiate if clinically indicated. Electronically Signed   By: Kerby Moors M.D.   On: 07/23/2020 14:49   IR IMAGING GUIDED PORT INSERTION  Result Date: 07/06/2020 INDICATION: Large B-cell lymphoma  EXAM: IMPLANTED PORT A CATH PLACEMENT WITH ULTRASOUND AND FLUOROSCOPIC GUIDANCE MEDICATIONS: None ANESTHESIA/SEDATION: Moderate (conscious) sedation was employed during this procedure. A total of Versed 2 mg and Fentanyl 100 mcg was administered intravenously. Moderate Sedation Time: 18 minutes. The patient's level of consciousness and vital signs were monitored continuously by radiology nursing throughout the procedure under my direct supervision. FLUOROSCOPY TIME:  0 minutes, 12 seconds (7 mGy) COMPLICATIONS: None immediate. PROCEDURE: The procedure, risks, benefits, and alternatives were explained to the patient. Questions regarding the procedure were encouraged and answered. The patient understands and consents to the procedure. A timeout was performed prior to the initiation of the procedure. Patient positioned supine on the angiography table. Right neck and anterior upper chest prepped and draped in the usual sterile fashion. All elements of maximal sterile barrier were utilized including, cap, mask, sterile gown, sterile gloves, large sterile drape, hand scrubbing and 2% Chlorhexidine for skin cleaning. The right internal jugular vein was evaluated with ultrasound and shown to be patent. A permanent ultrasound image was obtained and placed in the patient's medical record. Local anesthesia was provided with 1% lidocaine with epinephrine. Using sterile gel and a sterile probe  cover, the right internal jugular vein was entered with a 21 ga needle during real time ultrasound guidance. 0.018 inch guidewire placed and 21 ga needle exchanged for transitional dilator set. Utilizing fluoroscopy, 0.035 inch guidewire advanced through the needle without difficulty. Attention then turned to the right anterior upper chest. Following local lidocaine administration, a port pocket was created. The catheter was connected to the port and brought from the pocket to the venotomy site through a subcutaneous tunnel. The catheter was cut to size and inserted through the peel-away sheath. The catheter tip was positioned at the cavoatrial junction using fluoroscopic guidance. The port aspirated and flushed well. The port pocket was closed with deep and superficial absorbable suture. The port pocket incision and venotomy sites were also sealed with Dermabond. IMPRESSION: Successful placement of a right internal jugular approach power injectable Port-A-Cath. The catheter is ready for immediate use. Electronically Signed   By: Miachel Roux M.D.   On: 07/06/2020 17:27    ASSESSMENT & PLAN:   75 year old wonderful gentleman with history of hypertension, borderline diabetes, dyslipidemia, GERD with recent STEMI on 03/21/2020 and status post four-vessel CABG on 03/25/2020.  1) Left-sided primary testicular large B-cell lymphoma. Staging to be determined. BCL-2, BCL 6, c-Myc negative.  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 Uric acid normal. LDH normal. Potassium on lower  side. -Recommended pt increase his intake of potassium-rich foods-- orange juice, coconut water. No potassium supplementation needed at this time. -Discussed plan for treatment in future. Will adjust doses if pt continues to tolerate this. Will discuss intrathecal methotrexate at next visit as well. -Discussed scheduling conflicts. The patient notes he cannot do the appt on August 1, 2, 3 due to other medical appointments. Recommended connecting with schedulers for this. -Continue follow-up with primary, urology and cardiology for continued management of other medical issues. -Will see back in 10 days with labs and C2D1.    No orders of  the defined types were placed in this encounter.   FOLLOW UP: F/u for C2 starting 7/11 as ordered Plz add MD visit with C2D1     All of the patients questions were answered with apparent satisfaction. The patient knows to call the clinic with any problems, questions or concerns.   The total time spent in the appointment was 30 minutes and more than 50% was on counseling and direct patient cares.    Sullivan Lone MD [place] AAHIVMS [facility] Hematology/Oncology Physician [facility]  (Office):       [phone] (Work cell):  [phone] (Fax):           [phone]  07/24/2020 1:42 PM  I, Reinaldo Raddle, am acting as scribe for Dr. Sullivan Lone, MD. .I have reviewed the above documentation for accuracy and completeness, and I agree with the above. Brunetta Genera MD

## 2020-07-24 ENCOUNTER — Other Ambulatory Visit: Payer: Self-pay

## 2020-07-24 ENCOUNTER — Other Ambulatory Visit: Payer: Medicare HMO

## 2020-07-24 ENCOUNTER — Inpatient Hospital Stay: Payer: Medicare HMO | Attending: Hematology | Admitting: Hematology

## 2020-07-24 DIAGNOSIS — I252 Old myocardial infarction: Secondary | ICD-10-CM | POA: Insufficient documentation

## 2020-07-24 DIAGNOSIS — Z85828 Personal history of other malignant neoplasm of skin: Secondary | ICD-10-CM | POA: Insufficient documentation

## 2020-07-24 DIAGNOSIS — C8335 Diffuse large B-cell lymphoma, lymph nodes of inguinal region and lower limb: Secondary | ICD-10-CM | POA: Insufficient documentation

## 2020-07-24 DIAGNOSIS — Z87891 Personal history of nicotine dependence: Secondary | ICD-10-CM | POA: Insufficient documentation

## 2020-07-24 DIAGNOSIS — Z951 Presence of aortocoronary bypass graft: Secondary | ICD-10-CM | POA: Diagnosis not present

## 2020-07-24 DIAGNOSIS — C8330 Diffuse large B-cell lymphoma, unspecified site: Secondary | ICD-10-CM

## 2020-07-24 DIAGNOSIS — I255 Ischemic cardiomyopathy: Secondary | ICD-10-CM | POA: Insufficient documentation

## 2020-07-24 DIAGNOSIS — E785 Hyperlipidemia, unspecified: Secondary | ICD-10-CM | POA: Diagnosis not present

## 2020-07-24 DIAGNOSIS — Z5112 Encounter for antineoplastic immunotherapy: Secondary | ICD-10-CM | POA: Insufficient documentation

## 2020-07-24 DIAGNOSIS — I10 Essential (primary) hypertension: Secondary | ICD-10-CM | POA: Insufficient documentation

## 2020-07-24 DIAGNOSIS — Z5111 Encounter for antineoplastic chemotherapy: Secondary | ICD-10-CM | POA: Insufficient documentation

## 2020-07-24 DIAGNOSIS — Z5189 Encounter for other specified aftercare: Secondary | ICD-10-CM | POA: Insufficient documentation

## 2020-07-24 DIAGNOSIS — E119 Type 2 diabetes mellitus without complications: Secondary | ICD-10-CM | POA: Diagnosis not present

## 2020-07-24 DIAGNOSIS — K219 Gastro-esophageal reflux disease without esophagitis: Secondary | ICD-10-CM | POA: Insufficient documentation

## 2020-07-29 ENCOUNTER — Other Ambulatory Visit: Payer: Self-pay

## 2020-07-29 ENCOUNTER — Other Ambulatory Visit: Payer: Medicare HMO

## 2020-07-29 ENCOUNTER — Encounter: Payer: Medicare HMO | Attending: Cardiovascular Disease

## 2020-07-29 ENCOUNTER — Ambulatory Visit: Payer: Medicare HMO

## 2020-07-29 DIAGNOSIS — Z48812 Encounter for surgical aftercare following surgery on the circulatory system: Secondary | ICD-10-CM | POA: Diagnosis not present

## 2020-07-29 DIAGNOSIS — I252 Old myocardial infarction: Secondary | ICD-10-CM | POA: Diagnosis not present

## 2020-07-29 DIAGNOSIS — Z951 Presence of aortocoronary bypass graft: Secondary | ICD-10-CM | POA: Diagnosis not present

## 2020-07-29 NOTE — Progress Notes (Signed)
Daily Session Note  Patient Details  Name: Roy [name] MRN: [id number] Date of Birth: [birth year] Referring Provider:   Flowsheet Row Cardiac Rehab from 05/04/2020 in [facility]  Referring Provider [name] MD       Encounter Date: 07/29/2020  Check In:  Session Check In - 07/29/20 1435       Check-In   Supervising physician immediately available to respond to emergencies See telemetry face sheet for immediately available ER MD    Location [facility]-Cardiac & Pulmonary Rehab    Staff Present Birdie Sons, MPA, Nino Glow, MS, ASCM CEP, Exercise Physiologist;Joseph Tessie Fass, Cristopher Estimable, RN BSN    Virtual Visit No    Medication changes reported     No    Fall or balance concerns reported    No    Warm-up and Cool-down Performed on first and last piece of equipment    Resistance Training Performed Yes    VAD Patient? No    PAD/SET Patient? No      Pain Assessment   Currently in Pain? No/denies                Social History   Tobacco Use  Smoking Status Former   Pack years: 0.00   Types: Pipe   Quit date: 11/29/1976   Years since quitting: 43.6  Smokeless Tobacco Never    Goals Met:  Independence with exercise equipment Exercise tolerated well Personal goals reviewed No report of cardiac concerns or symptoms Strength training completed today  Goals Unmet:  Not Applicable  Comments: Pt able to follow exercise prescription today without complaint.  Will continue to monitor for progression.    Dr. Emily Filbert is Medical Director for [facility].  Dr. Ottie Glazier is Medical Director for [facility] Pulmonary Rehabilitation.

## 2020-07-30 ENCOUNTER — Encounter: Payer: Self-pay | Admitting: Hematology

## 2020-07-30 ENCOUNTER — Ambulatory Visit: Payer: Medicare HMO

## 2020-07-31 ENCOUNTER — Ambulatory Visit: Payer: Medicare HMO

## 2020-07-31 ENCOUNTER — Other Ambulatory Visit: Payer: Self-pay | Admitting: Adult Health

## 2020-07-31 DIAGNOSIS — C6292 Malignant neoplasm of left testis, unspecified whether descended or undescended: Secondary | ICD-10-CM

## 2020-08-02 NOTE — Progress Notes (Signed)
[name]    HEMATOLOGY/ONCOLOGY CLINIC NOTE  Date of Service: 08/03/2020  Patient Care Team: [name], DO as PCP - General (Family Medicine) O'Neal, [name], MD as PCP - Cardiology (Cardiology)  CHIEF COMPLAINTS/PURPOSE OF CONSULTATION:  Primary testicular large B cell lymphoma  HISTORY OF PRESENTING ILLNESS:   Roy [name] is a wonderful 75 y.o. male who has been referred to [place] by Dr Maudie Mercury for evaluation and management of primary testicular large B-cell lymphoma  Patient has a history of hypertension, borderline diabetes, previous nonischemic cardiomyopathy but with a recent MI,, CHF who presented with a left testicular swelling end of sept/ early oct 2021.  He notes that he was seen by Urology - Dr Minette Brine and had an ultrasound--possible infection was suspected and he received 2 rounds of abx without significant improvement.  He notes he received 2 subsequent follow-up ultrasounds in December in January and finally had left transinguinal orchiectomy on 03/11/2020. He notes he had persistent testicular swelling post operatively which required drainage.  He also notes having issues with urinary retention and had a urinary catheter for 2 weeks, abx+  Pathology showed primary testicular large B-cell lymphoma.  No other additional imaging studies for staging have been done at this time.  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  Patient was admitted to the hospital on 03/21/2020 with acute onset dyspnea epigastric pain and cough with pink frothy sputum.  He was noted to have hypoxia with oxygen saturation of 82% on arrival.  He was noted to have non-ST elevation MI on 2/26.  CTA chest ruled out PE.  His symptoms from fluid with aggressive diuresis.  He was noted to have severe triple-vessel disease on cath with an ejection fraction of 25 to 35% with no significant valvular lesions.  He subsequently had CORONARY ARTERY BYPASS  GRAFTING (CABG), ON PUMP, TIMES FOUR, USING BILATERAL INTERNAL MAMMARY ARTERIES AND LEFT RADIAL ARTERY .. On March 25, 2020.  Patient notes he is gradually recovering from his cardiac surgery notes being fatigued having gone through multiple urologic issues, skin surgery for squamous of carcinoma of the nose, acute MI CABG x4.  Patient notes no fevers no chills no night sweats. He notes he might have lost some weight but difficult to quantify in the setting of fluid overload and diuresis. Notes he is starting to eat a little better. Minimal left scrotal swelling. No other acute new focal symptoms.  INTERVAL HISTORY  Roy [name] is a wonderful 75 y.o. male who is here today for evaluation and management of Primary testicular large B cell lymphoma. The patient's last visit with [place] was on 07/24/2020. The pt reports that he is doing well overall. He is here for toxicity check and C2D1 R-CEOP.  The pt reports that he has been doing well with no new concerns over the last week since we last talked. He notes some continued fatigue due to his heart condition but no acute symptoms. He is enjoying his cardio rehab. The patient notes he has already taken Compazine this morning for preventing nausea.  Lab results today 08/03/2020 of CBC w/diff and CMP is as follows: all values are WNL except for WBC of 18.7K, RBC of 3.72, Hgb of 10.0, HCT of 32.0, RDW of 17.2, Neutro Abs of 16.6K, Glucose of 177, Creatinine of 1.56, Calcium of 7.8, Albumin of 3.4, GFR est of 46.  On review of systems, pt reports continued fatigue and denies  leg swelling, n/v/d, abdominal pain, back pain, difficulty passing urine, pain while urinating, and any other symptoms.  MEDICAL HISTORY:  Past Medical History:  Diagnosis Date   Allergic rhinitis    Arthritis    wrists   Cardiomyopathy, nonischemic [facility])    followed by cardiology--- dr [name]---  2016 ef 45-50% ,  2016 nuclear ef 37%,  2017 per echo ef 50-55%   CHF (congestive  heart failure), NYHA class III ([place]) 03/21/2020   Coronary artery disease    GERD (gastroesophageal reflux disease)    Hiatal hernia    History of kidney stones    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;   01/ 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   History of syncope (03-06-2020 pt stated has not had sycopal episode in few years, stated it seems to happen in extreme hot conditions)   cardiologist--- dr [name]--- dx recurrent syncope;  nuclear study 11-03-2014 intermediate risk w/ no ischemia, apical hypokinesis, nuclear ef 37%;  event monitor-- 12-21-2015 SB/ ST  no pauses/ arrythmia's;  echo 08-03-2015 ef 50-55%   Hyperlipidemia    Hypertension    followed by pcp   Hypovitaminosis D    Mass of left testicle    Nocturia    Plantar fasciitis    Presence of surgical incision    01/ 2022  moh's w/ skin graft of nose, per pt still healing and wear bandage daily   Type 2 diabetes mellitus ([place])    pt is adament that he is not and have been told he is a diabetic but a borderline;  followed by pcp, in pcp note states DM2 and takes 2 meds daily   Wears glasses    Wears hearing aid in both ears     SURGICAL HISTORY: Past Surgical History:  Procedure Laterality Date   COLONOSCOPY  last one 01-30-2017   CORONARY ARTERY BYPASS GRAFT N/A 03/25/2020   Procedure: CORONARY ARTERY BYPASS GRAFTING (CABG), ON PUMP, TIMES FOUR, USING BILATERAL INTERNAL MAMMARY ARTERIES AND LEFT RADIAL ARTERY;  Surgeon: [name], MD;  Location: [facility];  Service: Open Heart Surgery;  Laterality: N/A;   IR IMAGING GUIDED PORT INSERTION  07/06/2020   LOW ANTERIOR RESECTION RECTUM W/ COLOPROCTOSTOMY  05/2003   MOHS SURGERY  01/2020   nose w/ graft   ORCHIECTOMY Left 03/11/2020   Procedure: [place];  Surgeon: [name], MD;  Location: [facility];  Service: Urology;  Laterality: Left;  ONLY NEEDS 60 MIN   RADIAL ARTERY HARVEST Left  03/25/2020   Procedure: LEFT RADIAL ARTERY HARVEST;  Surgeon: [name], MD;  Location: [facility];  Service: Open Heart Surgery;  Laterality: Left;   RIGHT/LEFT HEART CATH AND CORONARY ANGIOGRAPHY N/A 03/23/2020   Procedure: RIGHT/LEFT HEART CATH AND CORONARY ANGIOGRAPHY;  Surgeon: [name], MD;  Location: [facility];  Service: Cardiovascular;  Laterality: N/A;   SHOULDER SURGERY Right 1992; 07/ 2021   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 Left 12/24/2008   left ear and nose    TEE WITHOUT CARDIOVERSION N/A 03/25/2020   Procedure: TRANSESOPHAGEAL ECHOCARDIOGRAM (TEE);  Surgeon: [name], MD;  Location: [place];  Service: Open Heart Surgery;  Laterality: N/A;   TONSILLECTOMY AND ADENOIDECTOMY  child   UPPER GASTROINTESTINAL ENDOSCOPY  last one 06-06-2017    SOCIAL HISTORY: Social History   Socioeconomic History   Marital status: Married  Spouse name: Not on file   Number of children: Not on file   Years of education: Not on file   Highest education level: Not on file  Occupational History   Not on file  Tobacco Use   Smoking status: Former    Pack years: 0.00    Types: Pipe    Quit date: 11/29/1976    Years since quitting: 43.7   Smokeless tobacco: Never  Vaping Use   Vaping Use: Never used  Substance and Sexual Activity   Alcohol use: Yes    Alcohol/week: 0.0 standard drinks    Comment: Occasional drink    Drug use: Never   Sexual activity: Not on file  Other Topics Concern   Not on file  Social History Narrative   Not on file   Social Determinants of Health   Financial Resource Strain: Not on file  Food Insecurity: Not on file  Transportation Needs: Not on file  Physical Activity: Not on file  Stress: Not on file  Social Connections: Not on file  Intimate Partner Violence: Not on file    FAMILY HISTORY: Family History  Problem Relation Age of Onset   Heart attack Mother    Stroke Father    Colon cancer Neg Hx     Esophageal cancer Neg Hx    Pancreatic cancer Neg Hx    Rectal cancer Neg Hx    Stomach cancer Neg Hx    Colon polyps Neg Hx     ALLERGIES:  has No Known Allergies.  MEDICATIONS:  Current Outpatient Medications  Medication Sig Dispense Refill   predniSONE (DELTASONE) 50 MG tablet Take 1 tablet (50 mg total) by mouth daily with breakfast for 5 days. 5 tablet 3   acetaminophen (TYLENOL) 325 MG tablet Take 2 tablets (650 mg total) by mouth every 4 (four) hours as needed for headache or mild pain.     Apoaequorin (PREVAGEN PO) Take 1 tablet by mouth at bedtime.     aspirin EC 81 MG tablet Take 81 mg by mouth daily. Swallow whole.     atorvastatin (LIPITOR) 40 MG tablet Take 1 tablet (40 mg total) by mouth daily. 90 tablet 3   cholecalciferol (VITAMIN D) 1000 UNITS tablet Take 1,000 Units by mouth See admin instructions. Mon-friday     empagliflozin (JARDIANCE) 10 MG TABS tablet Take 1 tablet (10 mg total) by mouth daily before breakfast. 90 tablet 1   furosemide (LASIX) 80 MG tablet Take 0.5 tablets (40 mg total) by mouth daily. 30 tablet 11   glucosamine-chondroitin 500-400 MG tablet Take 1 tablet by mouth. 5 times weekly     LORazepam (ATIVAN) 0.5 MG tablet Take 1 tablet (0.5 mg total) by mouth every 6 (six) hours as needed for anxiety. Put 1 pill under your tongue every 6 hrs, if needed, for nausea 30 tablet 0   metFORMIN (GLUCOPHAGE) 1000 MG tablet Take 1,000 mg by mouth 2 (two) times daily with a meal.      metoprolol (TOPROL XL) 200 MG 24 hr tablet Take 1 tablet (200 mg total) by mouth daily. 90 tablet 3   Multiple Vitamin (MULTIVITAMIN WITH MINERALS) TABS tablet Take 1 tablet by mouth. 5 times weekly     omeprazole (PRILOSEC) 20 MG capsule Take 20 mg by mouth daily.     ondansetron (ZOFRAN) 8 MG tablet Take 1 tablet (8 mg total) by mouth every 8 (eight) hours as needed for nausea or vomiting. Take 1 pill every 8 hrs  for 3 days. 30 tablet 3   prochlorperazine (COMPAZINE) 10 MG tablet  Take 1 tablet (10 mg total) by mouth every 8 (eight) hours as needed for nausea or vomiting. 40 tablet 2   sacubitril-valsartan (ENTRESTO) 24-26 MG Take 1 tablet by mouth 2 (two) times daily. 60 tablet 3   spironolactone (ALDACTONE) 25 MG tablet Take 0.5 tablets (12.5 mg total) by mouth daily. 60 tablet 1   tamsulosin (FLOMAX) 0.4 MG CAPS capsule Take 0.4 mg by mouth at bedtime.     vitamin B-12 (CYANOCOBALAMIN) 1000 MCG tablet Take 1,000 mcg by mouth. 5 times a week     Current Facility-Administered Medications  Medication Dose Route Frequency Provider Last Rate Last Admin   potassium chloride (KLOR-CON) CR tablet 20 mEq  20 mEq Oral BID [name], MD       Facility-Administered Medications Ordered in Other Visits  Medication Dose Route Frequency Provider Last Rate Last Admin   cyclophosphamide (CYTOXAN) 1,620 mg in sodium chloride 0.9 % 250 mL chemo infusion  750 mg/m2 (Treatment Plan Recorded) Intravenous Once Orson Slick, MD       dexamethasone (DECADRON) 10 mg in sodium chloride 0.9 % 50 mL IVPB  10 mg Intravenous Once Ledell Peoples IV, MD 204 mL/hr at 08/03/20 0945 10 mg at 08/03/20 0945   etoposide (VEPESID) 50 mg in sodium chloride 0.9 % 500 mL chemo infusion  25 mg/m2 (Treatment Plan Recorded) Intravenous Once Orson Slick, MD       heparin lock flush 100 unit/mL  500 Units Intracatheter Once PRN Orson Slick, MD       riTUXimab-pvvr (RUXIENCE) 800 mg in sodium chloride 0.9 % 170 mL infusion  375 mg/m2 (Treatment Plan Recorded) Intravenous Once Brunetta Genera, MD       sodium chloride flush (NS) 0.9 % injection 10 mL  10 mL Intracatheter PRN Ledell Peoples IV, MD       vinCRIStine (ONCOVIN) 2 mg in sodium chloride 0.9 % 50 mL chemo infusion  2 mg Intravenous Once Orson Slick, MD        REVIEW OF SYSTEMS:    10 Point review of Systems was done is negative except as noted above.  PHYSICAL EXAMINATION: ECOG PERFORMANCE STATUS: 2 - Symptomatic, <50%  confined to bed  . There were no vitals filed for this visit.  There were no vitals filed for this visit.  .There is no height or weight on file to calculate BMI.  Exam was given in infusion.  GENERAL:alert, in no acute distress and comfortable SKIN: no acute rashes, no significant lesions EYES: conjunctiva are pink and non-injected, sclera anicteric OROPHARYNX: MMM, no exudates, no oropharyngeal erythema or ulceration NECK: supple, no JVD LYMPH:  no palpable lymphadenopathy in the cervical, axillary or inguinal regions LUNGS: clear to auscultation b/l with normal respiratory effort HEART: regular rate & rhythm ABDOMEN:  normoactive bowel sounds , non tender, not distended. Extremity: no pedal edema PSYCH: alert & oriented x 3 with fluent speech NEURO: no focal motor/sensory deficits  LABORATORY DATA:  I have reviewed the data as listed   CBC Latest Ref Rng & Units 08/03/2020 07/23/2020 07/13/2020  WBC 4.0 - 10.5 K/uL 18.7(H) 22.9(H) 7.3  Hemoglobin 13.0 - 17.0 g/dL 10.0(L) 10.2(L) 10.1(L)  Hematocrit 39.0 - 52.0 % 32.0(L) 32.2(L) 32.3(L)  Platelets 150 - 400 K/uL 325 259 214    . CMP Latest Ref Rng & Units 08/03/2020 07/23/2020  07/13/2020  Glucose 70 - 99 mg/dL 177(H) 175(H) 159(H)  BUN 8 - 23 mg/dL $Remove'18 10 14  'pEknAHK$ Creatinine 0.61 - 1.24 mg/dL 1.56(H) 1.23 1.30(H)  Sodium 135 - 145 mmol/L 144 142 140  Potassium 3.5 - 5.1 mmol/L 3.6 3.3(L) 3.3(L)  Chloride 98 - 111 mmol/L 108 106 107  CO2 22 - 32 mmol/L $RemoveB'22 25 24  'xGSbgSmu$ Calcium 8.9 - 10.3 mg/dL 7.8(L) 8.5(L) 8.0(L)  Total Protein 6.5 - 8.1 g/dL 6.7 7.0 7.2  Total Bilirubin 0.3 - 1.2 mg/dL 1.0 0.9 0.8  Alkaline Phos 38 - 126 U/L 86 107 60  AST 15 - 41 U/L 17 14(L) 19  ALT 0 - 44 U/L $Remo'22 30 16   'xGDZA$ . Lab Results  Component Value Date   LDH 199 (H) 07/23/2020   SURGICAL PATHOLOGY   THIS IS AN ADDENDUM REPORT   CASE: [id number]  PATIENT: Tarius Vencill  Surgical Pathology Report  Addendum    Reason for Addendum #1:  Molecular  Genetic Test Results, FISH   Clinical History: Left testicular mass (crm)      FINAL MICROSCOPIC DIAGNOSIS:   A. TESTICLE, LEFT, ORCHIECTOMY:  -  Diffuse aggressive large B-cell lymphoma  -  See comment    COMMENT:   Sections of testicle show architectural effacement by sheets of large  lymphoid cells with vesicular nuclei and pale cytoplasm.  There is  admixed apoptotic debris and increased mitotic activity.  By  immunohistochemistry, the large lymphoid cells are positive for CD20,  CD5 (dim), BCL6 (dim), MUM1, and BCL2.  They are negative for CD10, CD30  (<1%), cyclin D1, and TdT.  The Ki67 proliferation index is up to  approximately 70%.  CD3 highlights small T-cells in the background. EBV  is negative by in situ hybridization.   Together, the findings support the diagnosis of a diffuse aggressive  large B-cell lymphoma. The differential diagnosis includes a diffuse  large B-cell lymphoma, NOS, with activated B-cell subtype by the [facility]  algorithm and high-grade B-cell lymphoma with MYC and BCL2 or BCL6  rearrangements.  FISH for BCL2, BCL6, and MYC rearrangements will be  performed. Correlation with clinical and radiographic findings is  recommended for consideration of a primary testicular lymphoma.   Result reported to L. Gibson on 03/16/20 at 1720 by S. O'Neill.    ADDENDUM:   FISH RESULTS:   Results: NORMAL   Interpretation:   BCL6 rearrangement:      Not Detected  MYC rearrangement:  Not Detected  MYC amplification:  Not Detected  BCL6 rearrangement:      Not Detected   Please note this testing was performed and interpreted by an outside  facility ([facility]).  This addendum is only being added to provide a  summary of the results for report completeness.  Please see electronic  medical record for a copy of the full report.   RADIOGRAPHIC STUDIES: I have personally reviewed the radiological images as listed and agreed with the findings in the  report. US SCROTUM W/DOPPLER  Result Date: 07/23/2020 CLINICAL DATA:  Right testicle pain EXAM: SCROTAL ULTRASOUND DOPPLER ULTRASOUND OF THE TESTICLES TECHNIQUE: Complete ultrasound examination of the testicles, epididymis, and other scrotal structures was performed. Color and spectral Doppler ultrasound were also utilized to evaluate blood flow to the testicles. COMPARISON:  01/07/2020 FINDINGS: Right testicle Measurements: 4.2 x 1.7 x 3.0 cm. No mass or microlithiasis visualized. Left testicle Measurements:  Status post left orchiectomy. Right epididymis:  Normal in size and appearance. Left epididymis:  Not visualized Hydrocele:  None visualized. Varicocele:  None visualized. Pulsed Doppler interrogation of both testes demonstrates normal low resistance arterial and venous waveforms bilaterally. Other: There is a solid echogenic mass with a 3.9 cm peripheral echogenic nodule measuring 2.4 x 1.6 x 2.1 cm. No significant increased blood flow identified within this structure. IMPRESSION: 1. No signs of right testicular mass or torsion. 2. Indeterminate, solid echogenic mass within the left hemiscrotum status post left orchiectomy for lymphoma. Primary differential considerations include residual postoperative change versus recurrent lymphoma. Contrast enhanced MRI of the scrotum may be helpful to differentiate if clinically indicated. Electronically Signed   By: Kerby Moors M.D.   On: 07/23/2020 14:49   IR IMAGING GUIDED PORT INSERTION  Result Date: 07/06/2020 INDICATION: Large B-cell lymphoma EXAM: IMPLANTED PORT A CATH PLACEMENT WITH ULTRASOUND AND FLUOROSCOPIC GUIDANCE MEDICATIONS: None ANESTHESIA/SEDATION: Moderate (conscious) sedation was employed during this procedure. A total of Versed 2 mg and Fentanyl 100 mcg was administered intravenously. Moderate Sedation Time: 18 minutes. The patient's level of consciousness and vital signs were monitored continuously by radiology nursing throughout the  procedure under my direct supervision. FLUOROSCOPY TIME:  0 minutes, 12 seconds (7 mGy) COMPLICATIONS: None immediate. PROCEDURE: The procedure, risks, benefits, and alternatives were explained to the patient. Questions regarding the procedure were encouraged and answered. The patient understands and consents to the procedure. A timeout was performed prior to the initiation of the procedure. Patient positioned supine on the angiography table. Right neck and anterior upper chest prepped and draped in the usual sterile fashion. All elements of maximal sterile barrier were utilized including, cap, mask, sterile gown, sterile gloves, large sterile drape, hand scrubbing and 2% Chlorhexidine for skin cleaning. The right internal jugular vein was evaluated with ultrasound and shown to be patent. A permanent ultrasound image was obtained and placed in the patient's medical record. Local anesthesia was provided with 1% lidocaine with epinephrine. Using sterile gel and a sterile probe cover, the right internal jugular vein was entered with a 21 ga needle during real time ultrasound guidance. 0.018 inch guidewire placed and 21 ga needle exchanged for transitional dilator set. Utilizing fluoroscopy, 0.035 inch guidewire advanced through the needle without difficulty. Attention then turned to the right anterior upper chest. Following local lidocaine administration, a port pocket was created. The catheter was connected to the port and brought from the pocket to the venotomy site through a subcutaneous tunnel. The catheter was cut to size and inserted through the peel-away sheath. The catheter tip was positioned at the cavoatrial junction using fluoroscopic guidance. The port aspirated and flushed well. The port pocket was closed with deep and superficial absorbable suture. The port pocket incision and venotomy sites were also sealed with Dermabond. IMPRESSION: Successful placement of a right internal jugular approach power  injectable Port-A-Cath. The catheter is ready for immediate use. Electronically Signed   By: Miachel Roux M.D.   On: 07/06/2020 17:27    ASSESSMENT & PLAN:   75 year old wonderful gentleman with history of hypertension, borderline diabetes, dyslipidemia, GERD with recent STEMI on 03/21/2020 and status post four-vessel CABG on 03/25/2020.  1) Left-sided primary testicular large B-cell lymphoma. Staging to be determined. BCL-2, BCL 6, c-Myc negative.  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 -Advised pt that we typically would complete a minimum of four total cycles. The standard is six cycles. -Recommended pt avoid taking own meds on mornings of treatment. We give nausea and other premeds prior to starting. -Will keep Vincristin and Cytoxan dosage the same. Will be increasing the Etoposide dosage today. -Will get labs again in two weeks. -Recommended pt increase his intake of potassium-rich foods-- orange juice, coconut water. No potassium supplementation needed at this time. -Discussed plan for treatment in future. Will adjust doses if pt continues to tolerate this. Will discuss intrathecal methotrexate at next visit as well. -Continue follow-up with primary, urology and cardiology for continued management of other medical issues. -Will see back in 2 weeks via phone. Labs 1 day prior.   No orders of the defined types were placed in this encounter.   FOLLOW UP: Portflush and labs in 2 weeks  Phone visit with Dr Irene Limbo 1 day after labs Plz schedule C3 of R-CEOP as per orders with portflush and labs Plz schedule C4 of  R-CEOP as per orders with portflush, labs and MD visit     All of the patients questions were answered with apparent satisfaction. The patient knows to call the clinic with any problems, questions or concerns.   The total time spent in the appointment was 39 minutes and more than 50% was on counseling and direct patient care, ordering and mc of chemotherapy    Sullivan Lone MD MS AAHIVMS [facility] Hematology/Oncology Physician [facility]  (Office):       [phone] (Work cell):  [phone] (Fax):           [phone]  08/03/2020 9:48 AM  I, Reinaldo Raddle, am acting as scribe for Dr. Sullivan Lone, MD.  .I have reviewed the above documentation for accuracy and completeness, and I agree with the above. Brunetta Genera MD

## 2020-08-03 ENCOUNTER — Other Ambulatory Visit: Payer: Self-pay

## 2020-08-03 ENCOUNTER — Inpatient Hospital Stay (HOSPITAL_BASED_OUTPATIENT_CLINIC_OR_DEPARTMENT_OTHER): Payer: Medicare HMO | Admitting: Hematology

## 2020-08-03 ENCOUNTER — Inpatient Hospital Stay: Payer: Medicare HMO

## 2020-08-03 ENCOUNTER — Other Ambulatory Visit: Payer: Self-pay | Admitting: Hematology and Oncology

## 2020-08-03 VITALS — BP 100/68 | HR 90 | Temp 97.8°F | Resp 18 | Wt 199.0 lb

## 2020-08-03 DIAGNOSIS — C8330 Diffuse large B-cell lymphoma, unspecified site: Secondary | ICD-10-CM

## 2020-08-03 DIAGNOSIS — E119 Type 2 diabetes mellitus without complications: Secondary | ICD-10-CM | POA: Diagnosis not present

## 2020-08-03 DIAGNOSIS — I10 Essential (primary) hypertension: Secondary | ICD-10-CM | POA: Diagnosis not present

## 2020-08-03 DIAGNOSIS — Z5111 Encounter for antineoplastic chemotherapy: Secondary | ICD-10-CM

## 2020-08-03 DIAGNOSIS — E785 Hyperlipidemia, unspecified: Secondary | ICD-10-CM | POA: Diagnosis not present

## 2020-08-03 DIAGNOSIS — I252 Old myocardial infarction: Secondary | ICD-10-CM | POA: Diagnosis not present

## 2020-08-03 DIAGNOSIS — I255 Ischemic cardiomyopathy: Secondary | ICD-10-CM | POA: Diagnosis not present

## 2020-08-03 DIAGNOSIS — K219 Gastro-esophageal reflux disease without esophagitis: Secondary | ICD-10-CM | POA: Diagnosis not present

## 2020-08-03 DIAGNOSIS — Z5112 Encounter for antineoplastic immunotherapy: Secondary | ICD-10-CM | POA: Diagnosis not present

## 2020-08-03 DIAGNOSIS — Z95828 Presence of other vascular implants and grafts: Secondary | ICD-10-CM

## 2020-08-03 DIAGNOSIS — C8335 Diffuse large B-cell lymphoma, lymph nodes of inguinal region and lower limb: Secondary | ICD-10-CM | POA: Diagnosis not present

## 2020-08-03 DIAGNOSIS — Z7189 Other specified counseling: Secondary | ICD-10-CM

## 2020-08-03 LAB — CBC WITH DIFFERENTIAL (CANCER CENTER ONLY)
Abs Immature Granulocytes: 0.09 10*3/uL — ABNORMAL HIGH (ref 0.00–0.07)
Basophils Absolute: 0.1 10*3/uL (ref 0.0–0.1)
Basophils Relative: 0 %
Eosinophils Absolute: 0.1 10*3/uL (ref 0.0–0.5)
Eosinophils Relative: 0 %
HCT: 32 % — ABNORMAL LOW (ref 39.0–52.0)
Hemoglobin: 10 g/dL — ABNORMAL LOW (ref 13.0–17.0)
Immature Granulocytes: 1 %
Lymphocytes Relative: 6 %
Lymphs Abs: 1.1 10*3/uL (ref 0.7–4.0)
MCH: 26.9 pg (ref 26.0–34.0)
MCHC: 31.3 g/dL (ref 30.0–36.0)
MCV: 86 fL (ref 80.0–100.0)
Monocytes Absolute: 0.8 10*3/uL (ref 0.1–1.0)
Monocytes Relative: 4 %
Neutro Abs: 16.6 10*3/uL — ABNORMAL HIGH (ref 1.7–7.7)
Neutrophils Relative %: 89 %
Platelet Count: 325 10*3/uL (ref 150–400)
RBC: 3.72 MIL/uL — ABNORMAL LOW (ref 4.22–5.81)
RDW: 17.2 % — ABNORMAL HIGH (ref 11.5–15.5)
WBC Count: 18.7 10*3/uL — ABNORMAL HIGH (ref 4.0–10.5)
nRBC: 0 % (ref 0.0–0.2)

## 2020-08-03 LAB — CMP (CANCER CENTER ONLY)
ALT: 22 U/L (ref 0–44)
AST: 17 U/L (ref 15–41)
Albumin: 3.4 g/dL — ABNORMAL LOW (ref 3.5–5.0)
Alkaline Phosphatase: 86 U/L (ref 38–126)
Anion gap: 14 (ref 5–15)
BUN: 18 mg/dL (ref 8–23)
CO2: 22 mmol/L (ref 22–32)
Calcium: 7.8 mg/dL — ABNORMAL LOW (ref 8.9–10.3)
Chloride: 108 mmol/L (ref 98–111)
Creatinine: 1.56 mg/dL — ABNORMAL HIGH (ref 0.61–1.24)
GFR, Estimated: 46 mL/min — ABNORMAL LOW (ref >60)
Glucose, Bld: 177 mg/dL — ABNORMAL HIGH (ref 70–99)
Potassium: 3.6 mmol/L (ref 3.5–5.1)
Sodium: 144 mmol/L (ref 135–145)
Total Bilirubin: 1 mg/dL (ref 0.3–1.2)
Total Protein: 6.7 g/dL (ref 6.5–8.1)

## 2020-08-03 MED ORDER — DIPHENHYDRAMINE HCL 25 MG PO CAPS
ORAL_CAPSULE | ORAL | Status: AC
  Filled 2020-08-03: qty 2

## 2020-08-03 MED ORDER — SODIUM CHLORIDE 0.9 % IV SOLN
25.0000 mg/m2 | Freq: Once | INTRAVENOUS | Status: DC
  Filled 2020-08-03: qty 2.5

## 2020-08-03 MED ORDER — ACETAMINOPHEN 325 MG PO TABS
ORAL_TABLET | ORAL | Status: AC
  Filled 2020-08-03: qty 2

## 2020-08-03 MED ORDER — SODIUM CHLORIDE 0.9 % IV SOLN
Freq: Once | INTRAVENOUS | Status: AC
  Filled 2020-08-03: qty 250

## 2020-08-03 MED ORDER — SODIUM CHLORIDE 0.9% FLUSH
10.0000 mL | Freq: Once | INTRAVENOUS | Status: AC
  Administered 2020-08-03: 10 mL
  Filled 2020-08-03: qty 10

## 2020-08-03 MED ORDER — PALONOSETRON HCL INJECTION 0.25 MG/5ML
0.2500 mg | Freq: Once | INTRAVENOUS | Status: AC
  Administered 2020-08-03: 0.25 mg via INTRAVENOUS

## 2020-08-03 MED ORDER — PALONOSETRON HCL INJECTION 0.25 MG/5ML
INTRAVENOUS | Status: AC
  Filled 2020-08-03: qty 5

## 2020-08-03 MED ORDER — VINCRISTINE SULFATE CHEMO INJECTION 1 MG/ML
2.0000 mg | Freq: Once | INTRAVENOUS | Status: DC
  Filled 2020-08-03: qty 2

## 2020-08-03 MED ORDER — SODIUM CHLORIDE 0.9 % IV SOLN
375.0000 mg/m2 | Freq: Once | INTRAVENOUS | Status: AC
  Administered 2020-08-03: 800 mg via INTRAVENOUS
  Filled 2020-08-03: qty 50

## 2020-08-03 MED ORDER — VINCRISTINE SULFATE CHEMO INJECTION 1 MG/ML
1.0000 mg | Freq: Once | INTRAVENOUS | Status: AC
Start: 2020-08-03 — End: 2020-08-03
  Administered 2020-08-03: 1 mg via INTRAVENOUS
  Filled 2020-08-03: qty 1

## 2020-08-03 MED ORDER — DIPHENHYDRAMINE HCL 25 MG PO CAPS
50.0000 mg | ORAL_CAPSULE | Freq: Once | ORAL | Status: AC
  Administered 2020-08-03: 50 mg via ORAL

## 2020-08-03 MED ORDER — SODIUM CHLORIDE 0.9 % IV SOLN
400.0000 mg/m2 | Freq: Once | INTRAVENOUS | Status: AC
  Administered 2020-08-03: 860 mg via INTRAVENOUS
  Filled 2020-08-03: qty 43

## 2020-08-03 MED ORDER — ACETAMINOPHEN 325 MG PO TABS
650.0000 mg | ORAL_TABLET | Freq: Once | ORAL | Status: AC
  Administered 2020-08-03: 650 mg via ORAL

## 2020-08-03 MED ORDER — ETOPOSIDE CHEMO INJECTION 1 GM/50ML
35.0000 mg/m2 | Freq: Once | INTRAVENOUS | Status: AC
Start: 2020-08-03 — End: 2020-08-03
  Administered 2020-08-03: 80 mg via INTRAVENOUS
  Filled 2020-08-03: qty 4

## 2020-08-03 MED ORDER — SODIUM CHLORIDE 0.9 % IV SOLN: 375.0000 mg/m2 | Freq: Once | INTRAVENOUS | Status: DC

## 2020-08-03 MED ORDER — HEPARIN SOD (PORK) LOCK FLUSH 100 UNIT/ML IV SOLN
500.0000 [IU] | Freq: Once | INTRAVENOUS | Status: AC | PRN
  Administered 2020-08-03: 500 [IU]
  Filled 2020-08-03: qty 5

## 2020-08-03 MED ORDER — SODIUM CHLORIDE 0.9 % IV SOLN
10.0000 mg | Freq: Once | INTRAVENOUS | Status: AC
  Administered 2020-08-03: 10 mg via INTRAVENOUS
  Filled 2020-08-03: qty 10

## 2020-08-03 MED ORDER — PREDNISONE 50 MG PO TABS: 50.0000 mg | ORAL_TABLET | Freq: Every day | ORAL | 3 refills | Status: AC

## 2020-08-03 MED ORDER — SODIUM CHLORIDE 0.9 % IV SOLN
750.0000 mg/m2 | Freq: Once | INTRAVENOUS | Status: DC
  Filled 2020-08-03: qty 81

## 2020-08-03 MED ORDER — SODIUM CHLORIDE 0.9% FLUSH
10.0000 mL | INTRAVENOUS | Status: DC | PRN
  Administered 2020-08-03: 10 mL
  Filled 2020-08-03: qty 10

## 2020-08-03 NOTE — Progress Notes (Signed)
Per Dr.Dorsey ok to treat with Scr. Of 1.56, requests we confirm with Dr.Kale after he arrives this morning.

## 2020-08-03 NOTE — Patient Instructions (Signed)

## 2020-08-03 NOTE — Progress Notes (Addendum)
Per Dr Irene Limbo, decrease vincristine to 1mg , cytoxan to 400mg /m2 and increase VP-16 to 35mg /m2.  Ok to give rapid rituximab

## 2020-08-03 NOTE — Patient Instructions (Signed)
[facility]  Discharge Instructions: Thank you for choosing [facility] to provide your oncology and hematology care.   If you have a lab appointment with the [facility], please go directly to the [facility] and check in at the registration area.   Wear comfortable clothing and clothing appropriate for easy access to any Portacath or PICC line.   We strive to give you quality time with your provider. You may need to reschedule your appointment if you arrive late (15 or more minutes).  Arriving late affects you and other patients whose appointments are after yours.  Also, if you miss three or more appointments without notifying the office, you may be dismissed from the clinic at the provider's discretion.      For prescription refill requests, have your pharmacy contact our office and allow 72 hours for refills to be completed.    Today you received the following chemotherapy and/or immunotherapy agents R-CEOP      To help prevent nausea and vomiting after your treatment, we encourage you to take your nausea medication as directed.  BELOW ARE SYMPTOMS THAT SHOULD BE REPORTED IMMEDIATELY: *FEVER GREATER THAN 100.4 F (38 C) OR HIGHER *CHILLS OR SWEATING *NAUSEA AND VOMITING THAT IS NOT CONTROLLED WITH YOUR NAUSEA MEDICATION *UNUSUAL SHORTNESS OF BREATH *UNUSUAL BRUISING OR BLEEDING *URINARY PROBLEMS (pain or burning when urinating, or frequent urination) *BOWEL PROBLEMS (unusual diarrhea, constipation, pain near the anus) TENDERNESS IN MOUTH AND THROAT WITH OR WITHOUT PRESENCE OF ULCERS (sore throat, sores in mouth, or a toothache) UNUSUAL RASH, SWELLING OR PAIN  UNUSUAL VAGINAL DISCHARGE OR ITCHING   Items with * indicate a potential emergency and should be followed up as soon as possible or go to the Emergency Department if any problems should occur.  Please show the CHEMOTHERAPY ALERT CARD or IMMUNOTHERAPY ALERT CARD at check-in to the  Emergency Department and triage nurse.  Should you have questions after your visit or need to cancel or reschedule your appointment, please contact [facility]  Dept: [phone]  and follow the prompts.  Office hours are 8:00 a.m. to 4:30 p.m. Monday - Friday. Please note that voicemails left after 4:00 p.m. may not be returned until the following business day.  We are closed weekends and major holidays. You have access to a nurse at all times for urgent questions. Please call the main number to the clinic Dept: [phone] and follow the prompts.   For any non-urgent questions, you may also contact your provider using MyChart. We now offer e-Visits for anyone 46 and older to request care online for non-urgent symptoms. For details visit [email or web address].   Also download the MyChart app! Go to the app store, search "MyChart", open the app, select [facility], and log in with your MyChart username and password.  Due to Covid, a mask is required upon entering the hospital/clinic. If you do not have a mask, one will be given to you upon arrival. For doctor visits, patients may have 1 support person aged 39 or older with them. For treatment visits, patients cannot have anyone with them due to current Covid guidelines and our immunocompromised population.

## 2020-08-04 ENCOUNTER — Inpatient Hospital Stay: Payer: Medicare HMO

## 2020-08-04 VITALS — BP 101/62 | HR 99 | Temp 98.1°F | Resp 18

## 2020-08-04 DIAGNOSIS — C8335 Diffuse large B-cell lymphoma, lymph nodes of inguinal region and lower limb: Secondary | ICD-10-CM | POA: Diagnosis not present

## 2020-08-04 DIAGNOSIS — E785 Hyperlipidemia, unspecified: Secondary | ICD-10-CM | POA: Diagnosis not present

## 2020-08-04 DIAGNOSIS — I10 Essential (primary) hypertension: Secondary | ICD-10-CM | POA: Diagnosis not present

## 2020-08-04 DIAGNOSIS — I255 Ischemic cardiomyopathy: Secondary | ICD-10-CM | POA: Diagnosis not present

## 2020-08-04 DIAGNOSIS — Z7189 Other specified counseling: Secondary | ICD-10-CM

## 2020-08-04 DIAGNOSIS — I252 Old myocardial infarction: Secondary | ICD-10-CM | POA: Diagnosis not present

## 2020-08-04 DIAGNOSIS — Z5112 Encounter for antineoplastic immunotherapy: Secondary | ICD-10-CM | POA: Diagnosis not present

## 2020-08-04 DIAGNOSIS — K219 Gastro-esophageal reflux disease without esophagitis: Secondary | ICD-10-CM | POA: Diagnosis not present

## 2020-08-04 DIAGNOSIS — Z5111 Encounter for antineoplastic chemotherapy: Secondary | ICD-10-CM | POA: Diagnosis not present

## 2020-08-04 DIAGNOSIS — C8330 Diffuse large B-cell lymphoma, unspecified site: Secondary | ICD-10-CM

## 2020-08-04 DIAGNOSIS — E119 Type 2 diabetes mellitus without complications: Secondary | ICD-10-CM | POA: Diagnosis not present

## 2020-08-04 MED ORDER — SODIUM CHLORIDE 0.9 % IV SOLN
35.0000 mg/m2 | Freq: Once | INTRAVENOUS | Status: AC
  Administered 2020-08-04: 80 mg via INTRAVENOUS
  Filled 2020-08-04: qty 4

## 2020-08-04 MED ORDER — PROCHLORPERAZINE MALEATE 10 MG PO TABS
10.0000 mg | ORAL_TABLET | Freq: Once | ORAL | Status: AC
  Administered 2020-08-04: 10 mg via ORAL

## 2020-08-04 MED ORDER — PROCHLORPERAZINE MALEATE 10 MG PO TABS
ORAL_TABLET | ORAL | Status: AC
  Filled 2020-08-04: qty 1

## 2020-08-04 MED ORDER — SODIUM CHLORIDE 0.9 % IV SOLN
Freq: Once | INTRAVENOUS | Status: AC
  Filled 2020-08-04: qty 250

## 2020-08-04 MED ORDER — HEPARIN SOD (PORK) LOCK FLUSH 100 UNIT/ML IV SOLN
500.0000 [IU] | Freq: Once | INTRAVENOUS | Status: AC | PRN
  Administered 2020-08-04: 500 [IU]
  Filled 2020-08-04: qty 5

## 2020-08-04 MED ORDER — SODIUM CHLORIDE 0.9% FLUSH
10.0000 mL | INTRAVENOUS | Status: DC | PRN
  Administered 2020-08-04: 10 mL
  Filled 2020-08-04: qty 10

## 2020-08-04 NOTE — Patient Instructions (Signed)
[facility]  Discharge Instructions: ?Thank you for choosing [facility] to provide your oncology and hematology care.  ? ?If you have a lab appointment with the Cancer Center, please go directly to the Cancer Center and check in at the registration area. ?  ?Wear comfortable clothing and clothing appropriate for easy access to any Portacath or PICC line.  ? ?We strive to give you quality time with your provider. You may need to reschedule your appointment if you arrive late (15 or more minutes).  Arriving late affects you and other patients whose appointments are after yours.  Also, if you miss three or more appointments without notifying the office, you may be dismissed from the clinic at the provider?s discretion.    ?  ?For prescription refill requests, have your pharmacy contact our office and allow 72 hours for refills to be completed.   ? ?Today you received the following chemotherapy and/or immunotherapy agents: Etoposide.     ?  ?To help prevent nausea and vomiting after your treatment, we encourage you to take your nausea medication as directed. ? ?BELOW ARE SYMPTOMS THAT SHOULD BE REPORTED IMMEDIATELY: ?*FEVER GREATER THAN 100.4 F (38 ?C) OR HIGHER ?*CHILLS OR SWEATING ?*NAUSEA AND VOMITING THAT IS NOT CONTROLLED WITH YOUR NAUSEA MEDICATION ?*UNUSUAL SHORTNESS OF BREATH ?*UNUSUAL BRUISING OR BLEEDING ?*URINARY PROBLEMS (pain or burning when urinating, or frequent urination) ?*BOWEL PROBLEMS (unusual diarrhea, constipation, pain near the anus) ?TENDERNESS IN MOUTH AND THROAT WITH OR WITHOUT PRESENCE OF ULCERS (sore throat, sores in mouth, or a toothache) ?UNUSUAL RASH, SWELLING OR PAIN  ?UNUSUAL VAGINAL DISCHARGE OR ITCHING  ? ?Items with * indicate a potential emergency and should be followed up as soon as possible or go to the Emergency Department if any problems should occur. ? ?Please show the CHEMOTHERAPY ALERT CARD or IMMUNOTHERAPY ALERT CARD at check-in  to the Emergency Department and triage nurse. ? ?Should you have questions after your visit or need to cancel or reschedule your appointment, please contact [facility]  Dept: [phone]  and follow the prompts.  Office hours are 8:00 a.m. to 4:30 p.m. Monday - Friday. Please note that voicemails left after 4:00 p.m. may not be returned until the following business day.  We are closed weekends and major holidays. You have access to a nurse at all times for urgent questions. Please call the main number to the clinic Dept: [phone] and follow the prompts. ? ? ?For any non-urgent questions, you may also contact your provider using MyChart. We now offer e-Visits for anyone 18 and older to request care online for non-urgent symptoms. For details visit [email or web address]. ?  ?Also download the MyChart app! Go to the app store, search "MyChart", open the app, select [facility], and log in with your MyChart username and password. ? ?Due to Covid, a mask is required upon entering the hospital/clinic. If you do not have a mask, one will be given to you upon arrival. For doctor visits, patients may have 1 support person aged 18 or older with them. For treatment visits, patients cannot have anyone with them due to current Covid guidelines and our immunocompromised population.  ? ?

## 2020-08-04 NOTE — Progress Notes (Signed)
Pt. states he took oral prednisone 50 mg at home prior to treatment. Per Dr. Irene Limbo Prednisone 50 mg is correct dose (100 mg noted in treatment plan). Pharmacy notified.

## 2020-08-05 ENCOUNTER — Other Ambulatory Visit: Payer: Self-pay | Admitting: Hematology

## 2020-08-05 ENCOUNTER — Other Ambulatory Visit: Payer: Self-pay

## 2020-08-05 ENCOUNTER — Encounter: Payer: Self-pay | Admitting: *Deleted

## 2020-08-05 ENCOUNTER — Inpatient Hospital Stay: Payer: Medicare HMO

## 2020-08-05 VITALS — BP 103/69 | HR 98 | Temp 97.9°F | Resp 16

## 2020-08-05 DIAGNOSIS — E119 Type 2 diabetes mellitus without complications: Secondary | ICD-10-CM | POA: Diagnosis not present

## 2020-08-05 DIAGNOSIS — K219 Gastro-esophageal reflux disease without esophagitis: Secondary | ICD-10-CM | POA: Diagnosis not present

## 2020-08-05 DIAGNOSIS — Z5112 Encounter for antineoplastic immunotherapy: Secondary | ICD-10-CM | POA: Diagnosis not present

## 2020-08-05 DIAGNOSIS — Z7189 Other specified counseling: Secondary | ICD-10-CM

## 2020-08-05 DIAGNOSIS — C8335 Diffuse large B-cell lymphoma, lymph nodes of inguinal region and lower limb: Secondary | ICD-10-CM | POA: Diagnosis not present

## 2020-08-05 DIAGNOSIS — I252 Old myocardial infarction: Secondary | ICD-10-CM | POA: Diagnosis not present

## 2020-08-05 DIAGNOSIS — Z5111 Encounter for antineoplastic chemotherapy: Secondary | ICD-10-CM | POA: Diagnosis not present

## 2020-08-05 DIAGNOSIS — C8330 Diffuse large B-cell lymphoma, unspecified site: Secondary | ICD-10-CM

## 2020-08-05 DIAGNOSIS — I10 Essential (primary) hypertension: Secondary | ICD-10-CM | POA: Diagnosis not present

## 2020-08-05 DIAGNOSIS — I255 Ischemic cardiomyopathy: Secondary | ICD-10-CM | POA: Diagnosis not present

## 2020-08-05 DIAGNOSIS — E785 Hyperlipidemia, unspecified: Secondary | ICD-10-CM | POA: Diagnosis not present

## 2020-08-05 DIAGNOSIS — Z951 Presence of aortocoronary bypass graft: Secondary | ICD-10-CM

## 2020-08-05 MED ORDER — HEPARIN SOD (PORK) LOCK FLUSH 100 UNIT/ML IV SOLN
500.0000 [IU] | Freq: Once | INTRAVENOUS | Status: AC | PRN
  Administered 2020-08-05: 500 [IU]
  Filled 2020-08-05: qty 5

## 2020-08-05 MED ORDER — SODIUM CHLORIDE 0.9 % IV SOLN
35.0000 mg/m2 | Freq: Once | INTRAVENOUS | Status: AC
  Administered 2020-08-05: 80 mg via INTRAVENOUS
  Filled 2020-08-05: qty 4

## 2020-08-05 MED ORDER — PROCHLORPERAZINE MALEATE 10 MG PO TABS
10.0000 mg | ORAL_TABLET | Freq: Once | ORAL | Status: AC
  Administered 2020-08-05: 10 mg via ORAL

## 2020-08-05 MED ORDER — SODIUM CHLORIDE 0.9 % IV SOLN
Freq: Once | INTRAVENOUS | Status: AC
  Filled 2020-08-05: qty 250

## 2020-08-05 MED ORDER — SODIUM CHLORIDE 0.9% FLUSH
10.0000 mL | INTRAVENOUS | Status: DC | PRN
Start: 2020-08-05 — End: 2020-08-05
  Administered 2020-08-05: 10 mL
  Filled 2020-08-05: qty 10

## 2020-08-05 MED ORDER — PROCHLORPERAZINE MALEATE 10 MG PO TABS
ORAL_TABLET | ORAL | Status: AC
  Filled 2020-08-05: qty 1

## 2020-08-05 NOTE — Patient Instructions (Signed)
[facility]  Discharge Instructions: ?Thank you for choosing [facility] to provide your oncology and hematology care.  ? ?If you have a lab appointment with the Cancer Center, please go directly to the Cancer Center and check in at the registration area. ?  ?Wear comfortable clothing and clothing appropriate for easy access to any Portacath or PICC line.  ? ?We strive to give you quality time with your provider. You may need to reschedule your appointment if you arrive late (15 or more minutes).  Arriving late affects you and other patients whose appointments are after yours.  Also, if you miss three or more appointments without notifying the office, you may be dismissed from the clinic at the provider?s discretion.    ?  ?For prescription refill requests, have your pharmacy contact our office and allow 72 hours for refills to be completed.   ? ?Today you received the following chemotherapy and/or immunotherapy agents: Etoposide.     ?  ?To help prevent nausea and vomiting after your treatment, we encourage you to take your nausea medication as directed. ? ?BELOW ARE SYMPTOMS THAT SHOULD BE REPORTED IMMEDIATELY: ?*FEVER GREATER THAN 100.4 F (38 ?C) OR HIGHER ?*CHILLS OR SWEATING ?*NAUSEA AND VOMITING THAT IS NOT CONTROLLED WITH YOUR NAUSEA MEDICATION ?*UNUSUAL SHORTNESS OF BREATH ?*UNUSUAL BRUISING OR BLEEDING ?*URINARY PROBLEMS (pain or burning when urinating, or frequent urination) ?*BOWEL PROBLEMS (unusual diarrhea, constipation, pain near the anus) ?TENDERNESS IN MOUTH AND THROAT WITH OR WITHOUT PRESENCE OF ULCERS (sore throat, sores in mouth, or a toothache) ?UNUSUAL RASH, SWELLING OR PAIN  ?UNUSUAL VAGINAL DISCHARGE OR ITCHING  ? ?Items with * indicate a potential emergency and should be followed up as soon as possible or go to the Emergency Department if any problems should occur. ? ?Please show the CHEMOTHERAPY ALERT CARD or IMMUNOTHERAPY ALERT CARD at check-in  to the Emergency Department and triage nurse. ? ?Should you have questions after your visit or need to cancel or reschedule your appointment, please contact [facility]  Dept: [phone]  and follow the prompts.  Office hours are 8:00 a.m. to 4:30 p.m. Monday - Friday. Please note that voicemails left after 4:00 p.m. may not be returned until the following business day.  We are closed weekends and major holidays. You have access to a nurse at all times for urgent questions. Please call the main number to the clinic Dept: [phone] and follow the prompts. ? ? ?For any non-urgent questions, you may also contact your provider using MyChart. We now offer e-Visits for anyone 18 and older to request care online for non-urgent symptoms. For details visit [email or web address]. ?  ?Also download the MyChart app! Go to the app store, search "MyChart", open the app, select [facility], and log in with your MyChart username and password. ? ?Due to Covid, a mask is required upon entering the hospital/clinic. If you do not have a mask, one will be given to you upon arrival. For doctor visits, patients may have 1 support person aged 18 or older with them. For treatment visits, patients cannot have anyone with them due to current Covid guidelines and our immunocompromised population.  ? ?

## 2020-08-05 NOTE — Progress Notes (Signed)
Cardiac Individual Treatment Plan  Patient Details  Name: Roy [name] MRN: [id number] Date of Birth: 1945/12/16 Referring Provider:   Flowsheet Row Cardiac Rehab from 05/04/2020 in [facility]iac and [facility] Re[facility]  Referring Provider [name] MD       Initial Encounter Date:  Flowsheet Row Cardiac Rehab from 05/04/2020 in [facility] Cardiac and Pulmonary Rehab  Date 05/04/20       Visit Diagnosis: S/P CABG x 4  Patient's Home Medications on Admission:  Current Outpatient Medications:    acetaminophen (TYLENOL) 325 MG tablet, Take 2 tablets (650 mg total) by mouth every 4 (four) hours as needed for headache or mild pain., Disp: , Rfl:    Apoaequorin (PREVAGEN PO), Take 1 tablet by mouth at bedtime., Disp: , Rfl:    aspirin EC 81 MG tablet, Take 81 mg by mouth daily. Swallow whole., Disp: , Rfl:    atorvastatin (LIPITOR) 40 MG tablet, Take 1 tablet (40 mg total) by mouth daily., Disp: 90 tablet, Rfl: 3   cholecalciferol (VITAMIN D) 1000 UNITS tablet, Take 1,000 Units by mouth See admin instructions. Mon-friday, Disp: , Rfl:    empagliflozin (JARDIANCE) 10 MG TABS tablet, Take 1 tablet (10 mg total) by mouth daily before breakfast., Disp: 90 tablet, Rfl: 1   furosemide (LASIX) 80 MG tablet, Take 0.5 tablets (40 mg total) by mouth daily., Disp: 30 tablet, Rfl: 11   glucosamine-chondroitin 500-400 MG tablet, Take 1 tablet by mouth. 5 times weekly, Disp: , Rfl:    LORazepam (ATIVAN) 0.5 MG tablet, Take 1 tablet (0.5 mg total) by mouth every 6 (six) hours as needed for anxiety. Put 1 pill under your tongue every 6 hrs, if needed, for nausea, Disp: 30 tablet, Rfl: 0   metFORMIN (GLUCOPHAGE) 1000 MG tablet, Take 1,000 mg by mouth 2 (two) times daily with a meal. , Disp: , Rfl:    metoprolol (TOPROL XL) 200 MG 24 hr tablet, Take 1 tablet (200 mg total) by mouth daily., Disp: 90 tablet, Rfl: 3   Multiple Vitamin (MULTIVITAMIN WITH MINERALS) TABS tablet, Take 1 tablet by mouth. 5 times weekly,  Disp: , Rfl:    omeprazole (PRILOSEC) 20 MG capsule, Take 20 mg by mouth daily., Disp: , Rfl:    ondansetron (ZOFRAN) 8 MG tablet, Take 1 tablet (8 mg total) by mouth every 8 (eight) hours as needed for nausea or vomiting. Take 1 pill every 8 hrs for 3 days., Disp: 30 tablet, Rfl: 3   predniSONE (DELTASONE) 50 MG tablet, Take 1 tablet (50 mg total) by mouth daily with breakfast for 5 days., Disp: 5 tablet, Rfl: 3   prochlorperazine (COMPAZINE) 10 MG tablet, Take 1 tablet (10 mg total) by mouth every 8 (eight) hours as needed for nausea or vomiting., Disp: 40 tablet, Rfl: 2   sacubitril-valsartan (ENTRESTO) 24-26 MG, Take 1 tablet by mouth 2 (two) times daily., Disp: 60 tablet, Rfl: 3   spironolactone (ALDACTONE) 25 MG tablet, Take 0.5 tablets (12.5 mg total) by mouth daily., Disp: 60 tablet, Rfl: 1   tamsulosin (FLOMAX) 0.4 MG CAPS capsule, Take 0.4 mg by mouth at bedtime., Disp: , Rfl:    vitamin B-12 (CYANOCOBALAMIN) 1000 MCG tablet, Take 1,000 mcg by mouth. 5 times a week, Disp: , Rfl:   Current Facility-Administered Medications:    potassium chloride (KLOR-CON) CR tablet 20 mEq, 20 mEq, Oral, BID, Atkins, Glenice Bow, MD  Past Medical History: Past Medical History:  Diagnosis Date   Allergic rhinitis  Arthritis    wrists   Cardiomyopathy, nonischemic [facility])    followed by cardiology--- dr [name]---  2016 ef 45-50% ,  2016 nuclear ef 37%,  2017 per echo ef 50-55%   CHF (congestive heart failure), NYHA class III ([place]) 03/21/2020   Coronary artery disease    GERD (gastroesophageal reflux disease)    Hiatal hernia    History of kidney stones    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;   01/ 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   History of syncope (03-06-2020 pt stated has not had sycopal episode in few years, stated it seems to happen in extreme hot conditions)   cardiologist--- dr [name]--- dx recurrent syncope;  nuclear study 11-03-2014 intermediate  risk w/ no ischemia, apical hypokinesis, nuclear ef 37%;  event monitor-- 12-21-2015 SB/ ST  no pauses/ arrythmia's;  echo 08-03-2015 ef 50-55%   Hyperlipidemia    Hypertension    followed by pcp   Hypovitaminosis D    Mass of left testicle    Nocturia    Plantar fasciitis    Presence of surgical incision    01/ 2022  moh's w/ skin graft of nose, per pt still healing and wear bandage daily   Type 2 diabetes mellitus ([place])    pt is adament that he is not and have been told he is a diabetic but a borderline;  followed by pcp, in pcp note states DM2 and takes 2 meds daily   Wears glasses    Wears hearing aid in both ears     Tobacco Use: Social History   Tobacco Use  Smoking Status Former   Pack years: 0.00   Types: Pipe   Quit date: 11/29/1976   Years since quitting: 43.7  Smokeless Tobacco Never    Labs: Recent Review Scientist, physiological     Labs for ITP Cardiac and Pulmonary Rehab Latest Ref Rng & Units 03/28/2020 03/29/2020 03/30/2020 03/30/2020 07/03/2020   Cholestrol 100 - 199 mg/dL - - - - 128   LDLCALC 0 - 99 mg/dL - - - - 65   HDL >39 mg/dL - - - - 41   Trlycerides 0 - 149 mg/dL - - - - 124   Hemoglobin A1c 4.8 - 5.6 % - - - - -   PHART 7.350 - 7.450 - - - - -   PCO2ART 32.0 - 48.0 mmHg - - - - -   HCO3 20.0 - 28.0 mmol/L - - - - -   TCO2 22 - 32 mmol/L - - - - -   ACIDBASEDEF 0.0 - 2.0 mmol/L - - - - -   O2SAT % 61.4 60.3 59.8 56.5 -        Exercise Target Goals: Exercise Program Goal: Individual exercise prescription set using results from initial 6 min walk test and THRR while considering  patient's activity barriers and safety.   Exercise Prescription Goal: Initial exercise prescription builds to 30-45 minutes a day of aerobic activity, 2-3 days per week.  Home exercise guidelines will be given to patient during program as part of exercise prescription that the participant will acknowledge.   Education: Aerobic Exercise: - Group verbal and visual presentation on  the components of exercise prescription. Introduces F.I.T.T principle from ACSM for exercise prescriptions.  Reviews F.I.T.T. principles of aerobic exercise including progression. Written material given at graduation.   Education: Resistance Exercise: - Group verbal and visual presentation  on the components of exercise prescription. Introduces F.I.T.T principle from ACSM for exercise prescriptions  Reviews F.I.T.T. principles of resistance exercise including progression. Written material given at graduation. Flowsheet Row Cardiac Rehab from 07/29/2020 in [facility] Cardiac and Pulmonary Rehab  Date 07/22/20  Educator [place]  Instruction Review Code 1- Verbalizes Understanding        Education: Exercise & Equipment Safety: - Individual verbal instruction and demonstration of equipment use and safety with use of the equipment. Flowsheet Row Cardiac Rehab from 07/29/2020 in [facility] Cardiac and Pulmonary Rehab  Date 04/30/20  Educator [facility]  Instruction Review Code 1- Verbalizes Understanding       Education: Exercise Physiology & General Exercise Guidelines: - Group verbal and written instruction with models to review the exercise physiology of the cardiovascular system and associated critical values. Provides general exercise guidelines with specific guidelines to those with heart or lung disease.  Flowsheet Row Cardiac Rehab from 07/29/2020 in [facility] Cardiac and Pulmonary Rehab  Date 07/08/20  Educator [facility]  Instruction Review Code 1- Verbalizes Understanding       Education: Flexibility, Balance, Mind/Body Relaxation: - Group verbal and visual presentation with interactive activity on the components of exercise prescription. Introduces F.I.T.T principle from ACSM for exercise prescriptions. Reviews F.I.T.T. principles of flexibility and balance exercise training including progression. Also discusses the mind body connection.  Reviews various relaxation techniques to help reduce and manage stress (i.e. Deep  breathing, progressive muscle relaxation, and visualization). Balance handout provided to take home. Written material given at graduation. Flowsheet Row Cardiac Rehab from 07/29/2020 in [facility] Cardiac and Pulmonary Rehab  Date 07/29/20  Educator [facility]  Instruction Review Code 1- Verbalizes Understanding       Activity Barriers & Risk Stratification:  Activity Barriers & Cardiac Risk Stratification - 05/04/20 1237       Activity Barriers & Cardiac Risk Stratification   Activity Barriers Shortness of Breath;Decreased Ventricular Function;Deconditioning;Muscular Weakness;Other (comment)    Comments Right shoulder- limited ROM due to rotator cuff surgery and bicep tear    Cardiac Risk Stratification High             6 Minute Walk:  6 Minute Walk     Row Name 05/04/20 1245         6 Minute Walk   Phase Initial     Distance 1000 feet     Walk Time 6 minutes     # of Rest Breaks 0     MPH 1.89     METS 2.19     RPE 11     Perceived Dyspnea  1     VO2 Peak 7.69     Symptoms No     Resting HR 98 bpm     Resting BP 112/68     Resting Oxygen Saturation  99 %     Exercise Oxygen Saturation  during 6 min walk 99 %     Max Ex. HR 116 bpm     Max Ex. BP 122/66     2 Minute Post BP 110/68              Oxygen Initial Assessment:   Oxygen Re-Evaluation:   Oxygen Discharge (Final Oxygen Re-Evaluation):   Initial Exercise Prescription:  Initial Exercise Prescription - 05/04/20 1200       Date of Initial Exercise RX and Referring Provider   Date 05/04/20    Referring Provider [name] MD      Treadmill   MPH 1.7  Grade 0.5    Minutes 15    METs 2.42      Recumbant Bike   Level 2    RPM 60    Watts 10    Minutes 15    METs 2.1      NuStep   Level 2    SPM 80    Minutes 15    METs 2.1      T5 Nustep   Level 1    SPM 80    Minutes 15    METs 2.1      Prescription Details   Frequency (times per week) 2    Duration Progress to 30 minutes of  continuous aerobic without signs/symptoms of physical distress      Intensity   THRR 40-80% of Max Heartrate 116-134    Ratings of Perceived Exertion 11-13    Perceived Dyspnea 0-4      Progression   Progression Continue to progress workloads to maintain intensity without signs/symptoms of physical distress.      Resistance Training   Training Prescription Yes    Weight 3 lb    Reps 10-15             Perform Capillary Blood Glucose checks as needed.  Exercise Prescription Changes:   Exercise Prescription Changes     Row Name 05/04/20 1200 05/20/20 1500 06/02/20 1300 06/08/20 1400 06/17/20 0800     Response to Exercise   Blood Pressure (Admit) 112/68 116/64 104/64 -- 94/60   Blood Pressure (Exercise) 122/66 124/60 142/70 -- 110/64   Blood Pressure (Exit) 110/68 104/62 90/62 -- 98/60   Heart Rate (Admit) 98 bpm 74 bpm 100 bpm -- 93 bpm   Heart Rate (Exercise) 116 bpm 111 bpm 112 bpm -- 109 bpm   Heart Rate (Exit) 99 bpm 100 bpm 104 bpm -- 100 bpm   Oxygen Saturation (Admit) 99 % -- -- -- --   Oxygen Saturation (Exercise) 99 % -- -- -- --   Oxygen Saturation (Exit) 99 % -- -- -- --   Rating of Perceived Exertion (Exercise) _0 -- 11   Perceived Dyspnea (Exercise) 1 -- -- -- --   Symptoms none none -- -- none   Comments walk test results third full day of exercise -- -- --   Duration -- Continue with 30 min of aerobic exercise without signs/symptoms of physical distress. Continue with 30 min of aerobic exercise without signs/symptoms of physical distress. -- Continue with 30 min of aerobic exercise without signs/symptoms of physical distress.   Intensity -- THRR unchanged THRR unchanged -- THRR unchanged     Progression   Progression -- Continue to progress workloads to maintain intensity without signs/symptoms of physical distress. Continue to progress workloads to maintain intensity without signs/symptoms of physical distress. -- Continue to progress workloads to  maintain intensity without signs/symptoms of physical distress.   Average METs -- 1.93 2.16 -- 2.57     Resistance Training   Training Prescription -- Yes Yes -- Yes   Weight -- 3 lb 3 lb -- 3 lb   Reps -- 10-15 10-15 -- 10-15     Interval Training   Interval Training -- No No -- No     Treadmill   MPH -- 0.7 1.1 -- 1.3   Grade -- 0 0.5 -- 0   Minutes -- 15 15 -- 15   METs -- 1.5 1.92 -- 2     Recumbant Bike   Level --  2 1 -- 1   Watts -- -- -- -- 16   Minutes -- 15 15 -- 15   METs -- 2 2.3 -- 2.52     NuStep   Level -- 2 -- -- --   Minutes -- 15 -- -- --   METs -- 2.2 -- -- --     REL-XR   Level -- -- -- -- 1   Minutes -- -- -- -- 15   METs -- -- -- -- 3.2     T5 Nustep   Level -- 1 -- -- --   Minutes -- 15 -- -- --   METs -- 2 -- -- --     Home Exercise Plan   Plans to continue exercise at -- -- -- Home (comment)  walking, weights, staff videos Home (comment)  walking, weights, staff videos   Frequency -- -- -- Add 2 additional days to program exercise sessions. Add 2 additional days to program exercise sessions.   Initial Home Exercises Provided -- -- -- 06/08/20 06/08/20    Row Name 06/30/20 1300 07/14/20 1500 07/30/20 0900         Response to Exercise   Blood Pressure (Admit) 122/62 104/64 126/64     Blood Pressure (Exercise) 124/70 148/60 --     Blood Pressure (Exit) 122/64 98/60 102/60     Heart Rate (Admit) 92 bpm 97 bpm 110 bpm     Heart Rate (Exercise) 108 bpm 117 bpm 128 bpm     Heart Rate (Exit) 92 bpm 99 bpm 104 bpm     Rating of Perceived Exertion (Exercise) _0 Symptoms none none none     Duration Continue with 30 min of aerobic exercise without signs/symptoms of physical distress. Continue with 30 min of aerobic exercise without signs/symptoms of physical distress. Continue with 30 min of aerobic exercise without signs/symptoms of physical distress.     Intensity THRR unchanged THRR unchanged THRR unchanged           Progression        Progression Continue to progress workloads to maintain intensity without signs/symptoms of physical distress. Continue to progress workloads to maintain intensity without signs/symptoms of physical distress. Continue to progress workloads to maintain intensity without signs/symptoms of physical distress.     Average METs 2.2 2.44 2.31           Resistance Training       Training Prescription Yes Yes Yes     Weight 3 lb 3 lb 5 lb     Reps 10-15 10-15 10-15           Interval Training       Interval Training No No No           Treadmill       MPH 1.3 1.3 1.6     Grade 0 0 0     Minutes _1 METs 2 2 2.23           REL-XR       Level _2 Minutes _3 METs -- 2.9 2.3           Home Exercise Plan       Plans to continue exercise at Home (comment)  walking, weights, staff videos Home (comment)  walking, weights, staff videos Home (comment)  walking, weights, staff videos  Frequency Add 2 additional days to program exercise sessions. Add 2 additional days to program exercise sessions. Add 2 additional days to program exercise sessions.     Initial Home Exercises Provided 06/08/20 06/08/20 06/08/20             Exercise Comments:   Exercise Goals and Review:   Exercise Goals     Row Name 05/04/20 1304             Exercise Goals   Increase Physical Activity Yes       Intervention Provide advice, education, support and counseling about physical activity/exercise needs.;Develop an individualized exercise prescription for aerobic and resistive training based on initial evaluation findings, risk stratification, comorbidities and participant's personal goals.       Expected Outcomes Short Term: Attend rehab on a regular basis to increase amount of physical activity.;Long Term: Exercising regularly at least 3-5 days a week.;Long Term: Add in home exercise to make exercise part of routine and to increase amount of physical activity.       Increase  Strength and Stamina Yes       Intervention Provide advice, education, support and counseling about physical activity/exercise needs.;Develop an individualized exercise prescription for aerobic and resistive training based on initial evaluation findings, risk stratification, comorbidities and participant's personal goals.       Expected Outcomes Short Term: Increase workloads from initial exercise prescription for resistance, speed, and METs.;Short Term: Perform resistance training exercises routinely during rehab and add in resistance training at home;Long Term: Improve cardiorespiratory fitness, muscular endurance and strength as measured by increased METs and functional capacity (6MWT)       Able to understand and use rate of perceived exertion (RPE) scale Yes       Intervention Provide education and explanation on how to use RPE scale       Expected Outcomes Short Term: Able to use RPE daily in rehab to express subjective intensity level;Long Term:  Able to use RPE to guide intensity level when exercising independently       Able to understand and use Dyspnea scale Yes       Intervention Provide education and explanation on how to use Dyspnea scale       Expected Outcomes Short Term: Able to use Dyspnea scale daily in rehab to express subjective sense of shortness of breath during exertion;Long Term: Able to use Dyspnea scale to guide intensity level when exercising independently       Knowledge and understanding of Target Heart Rate Range (THRR) Yes       Intervention Provide education and explanation of THRR including how the numbers were predicted and where they are located for reference       Expected Outcomes Short Term: Able to state/look up THRR;Short Term: Able to use daily as guideline for intensity in rehab;Long Term: Able to use THRR to govern intensity when exercising independently       Able to check pulse independently Yes       Intervention Provide education and demonstration on how  to check pulse in carotid and radial arteries.;Review the importance of being able to check your own pulse for safety during independent exercise       Expected Outcomes Short Term: Able to explain why pulse checking is important during independent exercise;Long Term: Able to check pulse independently and accurately       Understanding of Exercise Prescription Yes       Intervention Provide education, explanation, and written  materials on patient's individual exercise prescription       Expected Outcomes Short Term: Able to explain program exercise prescription;Long Term: Able to explain home exercise prescription to exercise independently                Exercise Goals Re-Evaluation :  Exercise Goals Re-Evaluation     Row Name 05/14/20 1407 05/20/20 1553 06/02/20 1332 06/08/20 1406 06/17/20 0757     Exercise Goal Re-Evaluation   Exercise Goals Review Increase Physical Activity;Able to understand and use rate of perceived exertion (RPE) scale;Knowledge and understanding of Target Heart Rate Range (THRR);Understanding of Exercise Prescription;Increase Strength and Stamina;Able to check pulse independently Increase Physical Activity;Increase Strength and Stamina;Understanding of Exercise Prescription Increase Physical Activity;Increase Strength and Stamina Increase Physical Activity;Increase Strength and Stamina;Understanding of Exercise Prescription Increase Physical Activity;Increase Strength and Stamina;Understanding of Exercise Prescription   Comments Reviewed RPE and dyspnea scales, THR and program prescription with pt today.  Pt voiced understanding and was given a copy of goals to take home. Corsino is off to a good start in rehab.  He has completed his first three full days of exercise.  We will continue to monitor his progress. Lakins has increased to 1.1 mph on TM.  He is close to target HR range.  Staff will monitor progress. Tory is doing well in rehab.  He is feeling like his strength and  stamina are recoving.  He does not get as tired as quickly.  I he was able to take trash out last week without stopping!!  Reviewed home exercise with pt today.  Pt plans to walk and use staff videos at home for exercise.  Reviewed THR, pulse, RPE, sign and symptoms, pulse oximetery and when to call 911 or MD.  Also discussed weather considerations and indoor options.  Pt voiced understanding. Knipple has been doing well in rehab.  He would benefit from improved attendance as his progression is lacking.  He was down to 1.3 mph on treadmill at last session.  We will continue to monitor his progress.   Expected Outcomes Short: Use RPE daily to regulate intensity. Long: Follow program prescription in THR. Short: Continue to attend rehab regularly Long: Continue to follow program prescription Short: work in [employer] range Long: contineu to build stamina Short: start to add in more walk and staff videos at home Long: Continue to build stamina Short: Improved attendance Long: Continue to improve stamina    Row Name 06/24/20 1427 06/30/20 1316 07/14/20 1544 07/29/20 1440 07/30/20 0904     Exercise Goal Re-Evaluation   Exercise Goals Review Increase Physical Activity;Increase Strength and Stamina Increase Physical Activity;Increase Strength and Stamina Increase Physical Activity;Increase Strength and Stamina;Understanding of Exercise Prescription Understanding of Exercise Prescription Increase Physical Activity;Increase Strength and Stamina   Comments Niess is walking on days not at [facility].  He is still not lifting too heavy but does some yard work.  He has noticed he doesnt have to stop as much walking down driveway.  He would have to stop both ways before he was exercising.  He also walked in from patient parking instead of using valet. Fread missed some sessions in May due to other health issues.  He is getting back on track with attendance. Stanforth continues to have appointments for chemo treatments which inhibit him from  attending rehab regularly.  We will continue to encourge improved attendance and increase workloads.  We will continue to monitor his progress. Isabell wants to be able to walk outside  if weather is permitting. Informed him about the [facility] down the hall and is taking it into consideration. Gave patient information pamphlet on the [place]. Ramcharan is doing well. He has built up his tolerance on the treadmill to a 1.6 speed and 5 lbs for handweights. Will continue to monitor.   Expected Outcomes Short: maintain consistent exercise Long: continue to buils stamina SHort: attend at least twice per week Long: improve overall stamina Short: Consistent attendance Long: improve stamina Short: check to see if he has [place] and fit. Long: maintain a workout routine independently. Short: Continue exercise prescription Long: Continue to build up overall strength and stamina            Discharge Exercise Prescription (Final Exercise Prescription Changes):  Exercise Prescription Changes - 07/30/20 0900       Response to Exercise   Blood Pressure (Admit) 126/64    Blood Pressure (Exit) 102/60    Heart Rate (Admit) 110 bpm    Heart Rate (Exercise) 128 bpm    Heart Rate (Exit) 104 bpm    Rating of Perceived Exertion (Exercise) 12    Symptoms none    Duration Continue with 30 min of aerobic exercise without signs/symptoms of physical distress.    Intensity THRR unchanged      Progression   Progression Continue to progress workloads to maintain intensity without signs/symptoms of physical distress.    Average METs 2.31      Resistance Training   Training Prescription Yes    Weight 5 lb    Reps 10-15      Interval Training   Interval Training No      Treadmill   MPH 1.6    Grade 0    Minutes 30    METs 2.23      REL-XR   Level 1    Minutes 15    METs 2.3      Home Exercise Plan   Plans to continue exercise at Home (comment)   walking, weights, staff videos   Frequency Add 2 additional days  to program exercise sessions.    Initial Home Exercises Provided 06/08/20             Nutrition:  Target Goals: Understanding of nutrition guidelines, daily intake of sodium <1549m, cholesterol <2058m calories 30% from fat and 7% or less from saturated fats, daily to have 5 or more servings of fruits and vegetables.  Education: All About Nutrition: -Group instruction provided by verbal, written material, interactive activities, discussions, models, and posters to present general guidelines for heart healthy nutrition including fat, fiber, MyPlate, the role of sodium in heart healthy nutrition, utilization of the nutrition label, and utilization of this knowledge for meal planning. Follow up email sent as well. Written material given at graduation. Flowsheet Row Cardiac Rehab from 07/29/2020 in [facility]  Date 06/03/20  Educator MCMethodist [facility] CenterInstruction Review Code 1- Verbalizes Understanding       Biometrics:  Pre Biometrics - 05/04/20 1236       Pre Biometrics   Height 6' (1.829 m)    Weight 203 lb 8 oz (92.3 kg)    BMI (Calculated) 27.59    Single Leg Stand 15.9 seconds              Nutrition Therapy Plan and Nutrition Goals:  Nutrition Therapy & Goals - 05/19/20 1002       Nutrition Therapy   Diet Heart healthy, low Na, Diabetes friendly  Protein (specify units) 95-110g (active cancer - not in treatment yet)    Fiber 30 grams    Whole Grain Foods 3 servings    Saturated Fats 12 max. grams    Fruits and Vegetables 8 servings/day    Sodium 1.5 grams      Personal Nutrition Goals   Nutrition Goal ST: Increase fruit/vegetable intake to 5 servings per day (eat a rainbow per week), limit processed meat to <2x/month, mix brown rice with white rice to see how he enjoys it. LT: meet nutritional needs going through treatment, make sure RD is part of care team    Comments He has active Cancer, but needs to talk with cardiologist regarding his heart status  and cancer treatment. The oncologists say he needs to begin treatment soon. Encouraged to ensure RD is a part of his treatment team. He is honoring his hunger cues and eating until satisfied, but not uncomfortably full. He is no longer eating red meat every day. He eats mostly chicken, fish, shrimp, and pork chops. He likes to eat many green vegetables which he will steam and he enjoys fruit - 3 servings per day normally (2 fruits and a vegetable; canned fruit typically -in own juice). He uses mostly olive oil, if eating eggs will use bacon fat - eats processed meat like bacon 2x/month. Does not eat much bread, but he enjoys potatoes. A1C: 6.7. He does not take his BG at home and does not have a monitor at home. B: fruit cocktail or oatmeal or griits with dried fruit or a pad of butter; this morning he had an english muffin (regular) with strawberry jelly, butter, and a salmon patty with coffee (2 splenda and half and half) and apple juice for his medications. S:2 graham cracker with PB and 2% milk or white bread and [place] sandwich D: pork chop with mac and cheese and fried apples and iced tea (sweet) tonight. He will occassionally have shot of liquor or wine. Drinks: water and occassionally pepsi, coke, dr pepper, 7-up. He reports liking whole wheat bread, but does not like it every day. Discussed heart healthy eating, diabetes friendly eating, and eating during cancer treatment (discussed possible barriers and increased needs).      Intervention Plan   Intervention Prescribe, educate and counsel regarding individualized specific dietary modifications aiming towards targeted core components such as weight, hypertension, lipid management, diabetes, heart failure and other comorbidities.;Nutrition handout(s) given to patient.    Expected Outcomes Short Term Goal: Understand basic principles of dietary content, such as calories, fat, sodium, cholesterol and nutrients.;Short Term Goal: A plan has been developed  with personal nutrition goals set during dietitian appointment.;Long Term Goal: Adherence to prescribed nutrition plan.             Nutrition Assessments:  MEDIFICTS Score Key: ?70 Need to make dietary changes  40-70 Heart Healthy Diet ? 40 Therapeutic Level Cholesterol Diet  Flowsheet Row Cardiac Rehab from 05/04/2020 in [facility] Card[facility] and Pulmonary Rehab  Picture Your Plate Total Score on Admission 62      Picture Your Plate Scores: <32 Unhealthy dietary pattern with much room for improvement. 41-50 Dietary pattern unlikely to meet recommendations for good health and room for improvement. 51-60 More healthful dietary pattern, with some room for improvement.  >60 Healthy dietary pattern, although there may be some specific behaviors that could be improved.    Nutrition Goals Re-Evaluation:  Nutrition Goals Re-Evaluation     [place] Name 06/08/20 1409 06/24/20 1430  07/29/20 1444         Goals   Nutrition Goal ST: Increase fruit/vegetable intake to 5 servings per day (eat a rainbow per week), limit processed meat to <2x/month, mix brown rice with white rice to see how he enjoys it. LT: meet nutritional needs going through treatment, make sure RD is part of care team -- add products that have potassium to his diet.     Comment He met with Melissa recently and has already backed off his portion sizes.  He is eating more fruit than ever before.  He is trying to get a good variety.  He is sticking with 2-21/2 meals a day.  Usually breakfast, snack, and dinner. Carn reports eating 2 good meals and a snack each day.  He watches what he eats and not as much red meat.  He is eating fresh and frozen vegetables.  He has also increased fruit - avoiding grapefruit due to medication interaction.  He eats a meat with pasta salad and a fruit for dinner. He states that his potassium has been low in the past and is working on more intake.     Expected Outcome Short: Continue twith changes Long: COnitnue  to focus on healthy eating. ST/LT:  continue with current plan Short: eat more foods with potassium. Long: maintain potassium levels.              Nutrition Goals Discharge (Final Nutrition Goals Re-Evaluation):  Nutrition Goals Re-Evaluation - 07/29/20 1444       Goals   Nutrition Goal add products that have potassium to his diet.    Comment He states that his potassium has been low in the past and is working on more intake.    Expected Outcome Short: eat more foods with potassium. Long: maintain potassium levels.             Psychosocial: Target Goals: Acknowledge presence or absence of significant depression and/or stress, maximize coping skills, provide positive support system. Participant is able to verbalize types and ability to use techniques and skills needed for reducing stress and depression.   Education: Stress, Anxiety, and Depression - Group verbal and visual presentation to define topics covered.  Reviews how body is impacted by stress, anxiety, and depression.  Also discusses healthy ways to reduce stress and to treat/manage anxiety and depression.  Written material given at graduation.   Education: Sleep Hygiene -Provides group verbal and written instruction about how sleep can affect your health.  Define sleep hygiene, discuss sleep cycles and impact of sleep habits. Review good sleep hygiene tips.    Initial Review & Psychosocial Screening:  Initial Psych Review & Screening - 04/30/20 0913       Initial Review   Current issues with None Identified      Family Dynamics   Good Support System? Yes    Comments He can look to his wife  for support. [place] has a positive outlook on his health.      Barriers   Psychosocial barriers to participate in program There are no identifiable barriers or psychosocial needs.;The patient should benefit from training in stress management and relaxation.      Screening Interventions   Interventions To provide support and  resources with identified psychosocial needs;Provide feedback about the scores to participant;Encouraged to exercise    Expected Outcomes Short Term goal: Utilizing psychosocial counselor, staff and physician to assist with identification of specific Stressors or current issues interfering with healing process. Setting desired  goal for each stressor or current issue identified.;Long Term Goal: Stressors or current issues are controlled or eliminated.;Short Term goal: Identification and review with participant of any Quality of Life or Depression concerns found by scoring the questionnaire.;Long Term goal: The participant improves quality of Life and PHQ9 Scores as seen by post scores and/or verbalization of changes             Quality of Life Scores:   Quality of Life - 05/04/20 1234       Quality of Life   Select Quality of Life      Quality of Life Scores   Health/Function Pre 20.07 %    Socioeconomic Pre 25.57 %    Psych/Spiritual Pre 30 %    Family Pre 27 %    GLOBAL Pre 24.31 %            Scores of 19 and below usually indicate a poorer quality of life in these areas.  A difference of  2-3 points is a clinically meaningful difference.  A difference of 2-3 points in the total score of the Quality of Life Index has been associated with significant improvement in overall quality of life, self-image, physical symptoms, and general health in studies assessing change in quality of life.  PHQ-9: Recent Review Flowsheet Data     Depression screen [facility] 2/9 05/04/2020   Decreased Interest 0   Down, Depressed, Hopeless 0   PHQ - 2 Score 0   Altered sleeping 2   Tired, decreased energy 1   Change in appetite 1   Feeling bad or failure about yourself  0   Trouble concentrating 0   Moving slowly or fidgety/restless 0   Suicidal thoughts 0   PHQ-9 Score 4   Difficult doing work/chores Not difficult at all      Interpretation of Total Score  Total Score Depression Severity:  1-4  = Minimal depression, 5-9 = Mild depression, 10-14 = Moderate depression, 15-19 = Moderately severe depression, 20-27 = Severe depression   Psychosocial Evaluation and Intervention:  Psychosocial Evaluation - 04/30/20 0914       Psychosocial Evaluation & Interventions   Interventions Encouraged to exercise with the program and follow exercise prescription;Stress management education;Relaxation education    Comments He can look to his wife for support. [place] has a positive outlook on his health.    Expected Outcomes Short: Exercise regularly to support mental health and notify staff of any changes. Long: maintain mental health and well being through teaching of rehab or prescribed medications independently.    Continue Psychosocial Services  Follow up required by staff             Psychosocial Re-Evaluation:  Psychosocial Re-Evaluation     [place] Name 06/08/20 1407 06/24/20 1432 07/29/20 1436         Psychosocial Re-Evaluation   Current issues with Current Stress Concerns -- None Identified     Comments Last night was a rough sleep night.  However, he usually gets 7-9 hours.  Today, he feeling off his game and out of sorts with blurry vision upon standing. Mentally he is doing well.  Other than his health he has no major stressors.  They went to a wedding over the weekend.  He is generally a positive and happy person. [place] gets 7.5-9.5 hours of sleep and feels well rested.  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  He will start this in next 20 days.  They are starting  with small doses because of his heart.  He feels confident in the plan his Drs have for him. Patient reports no issues with their current mental states, sleep, stress, depression or anxiety. Will follow up with patient in a few weeks for any changes. His sleep has been a little better but he has to take lasix which can keep him up sometimes.     Expected Outcomes Short: Continue to keep close eye on health Long: Conitnue to stay  positive. Short: continue to work with Drs on treatment plan Long: continue to exercise to help manage stress Short: Continue to exercise regularly to support mental health and notify staff of any changes. Long: maintain mental health and well being through teaching of rehab or prescribed medications independently.     Interventions Encouraged to attend Cardiac Rehabilitation for the exercise -- Encouraged to attend Cardiac Rehabilitation for the exercise     Continue Psychosocial Services  Follow up required by staff -- Follow up required by staff              Psychosocial Discharge (Final Psychosocial Re-Evaluation):  Psychosocial Re-Evaluation - 07/29/20 1436       Psychosocial Re-Evaluation   Current issues with None Identified    Comments Patient reports no issues with their current mental states, sleep, stress, depression or anxiety. Will follow up with patient in a few weeks for any changes. His sleep has been a little better but he has to take lasix which can keep him up sometimes.    Expected Outcomes Short: Continue to exercise regularly to support mental health and notify staff of any changes. Long: maintain mental health and well being through teaching of rehab or prescribed medications independently.    Interventions Encouraged to attend Cardiac Rehabilitation for the exercise    Continue Psychosocial Services  Follow up required by staff             Vocational Rehabilitation: Provide vocational rehab assistance to qualifying candidates.   Vocational Rehab Evaluation & Intervention:   Education: Education Goals: Education classes will be provided on a variety of topics geared toward better understanding of heart health and risk factor modification. Participant will state understanding/return demonstration of topics presented as noted by education test scores.  Learning Barriers/Preferences:  Learning Barriers/Preferences - 04/30/20 0910       Learning  Barriers/Preferences   Learning Barriers None    Learning Preferences None             General Cardiac Education Topics:  AED/CPR: - Group verbal and written instruction with the use of models to demonstrate the basic use of the AED with the basic ABC's of resuscitation.   Anatomy and Cardiac Procedures: - Group verbal and visual presentation and models provide information about basic cardiac anatomy and function. Reviews the testing methods done to diagnose heart disease and the outcomes of the test results. Describes the treatment choices: Medical Management, Angioplasty, or Coronary Bypass Surgery for treating various heart conditions including Myocardial Infarction, Angina, Valve Disease, and Cardiac Arrhythmias.  Written material given at graduation. Flowsheet Row Cardiac Rehab from 07/29/2020 in [facility] Cardiac and Pulmonary Rehab  Date 07/22/20  Educator [facility]  Instruction Review Code 1- Verbalizes Understanding       Medication Safety: - Group verbal and visual instruction to review commonly prescribed medications for heart and lung disease. Reviews the medication, class of the drug, and side effects. Includes the steps to properly store meds and maintain the prescription regimen.  Written material given at graduation. Flowsheet Row Cardiac Rehab from 07/29/2020 in [facility] Cardiac and Pulmonary Rehab  Date 06/10/20  Educator SB  Instruction Review Code 1- Verbalizes Understanding       Intimacy: - Group verbal instruction through game format to discuss how heart and lung disease can affect sexual intimacy. Written material given at graduation..   Know Your Numbers and Heart Failure: - Group verbal and visual instruction to discuss disease risk factors for cardiac and pulmonary disease and treatment options.  Reviews associated critical values for Overweight/Obesity, Hypertension, Cholesterol, and Diabetes.  Discusses basics of heart failure: signs/symptoms and treatments.   Introduces Heart Failure Zone chart for action plan for heart failure.  Written material given at graduation.   Infection Prevention: - Provides verbal and written material to individual with discussion of infection control including proper hand washing and proper equipment cleaning during exercise session. Flowsheet Row Cardiac Rehab from 07/29/2020 in [facility] Cardiac and Pulmonary Rehab  Date 04/30/20  Educator [facility]  Instruction Review Code 1- Verbalizes Understanding       Falls Prevention: - Provides verbal and written material to individual with discussion of falls prevention and safety. Flowsheet Row Cardiac Rehab from 07/29/2020 in [facility] Cardiac and Pulmonary Rehab  Date 04/30/20  Educator [facility]  Instruction Review Code 1- Verbalizes Understanding       Other: -Provides group and verbal instruction on various topics (see comments)   Knowledge Questionnaire Score:  Knowledge Questionnaire Score - 05/04/20 1227       Knowledge Questionnaire Score   Pre Score 22/26: Angina, Nutrition, Exercise             Core Components/Risk Factors/Patient Goals at Admission:  Personal Goals and Risk Factors at Admission - 05/04/20 1307       Core Components/Risk Factors/Patient Goals on Admission    Weight Management Yes;Weight Maintenance    Intervention Weight Management: Develop a combined nutrition and exercise program designed to reach desired caloric intake, while maintaining appropriate intake of nutrient and fiber, sodium and fats, and appropriate energy expenditure required for the weight goal.;Weight Management: Provide education and appropriate resources to help participant work on and attain dietary goals.;Weight Management/Obesity: Establish reasonable short term and long term weight goals.    Admit Weight 203 lb (92.1 kg)    Goal Weight: Short Term 203 lb (92.1 kg)    Goal Weight: Long Term 203 lb (92.1 kg)    Expected Outcomes Short Term: Continue to assess and modify  interventions until short term weight is achieved;Long Term: Adherence to nutrition and physical activity/exercise program aimed toward attainment of established weight goal;Weight Maintenance: Understanding of the daily nutrition guidelines, which includes 25-35% calories from fat, 7% or less cal from saturated fats, less than 221m cholesterol, less than 1.5gm of sodium, & 5 or more servings of fruits and vegetables daily;Understanding recommendations for meals to include 15-35% energy as protein, 25-35% energy from fat, 35-60% energy from carbohydrates, less than 2042mof dietary cholesterol, 20-35 gm of total fiber daily;Understanding of distribution of calorie intake throughout the day with the consumption of 4-5 meals/snacks    Diabetes Yes    Intervention Provide education about signs/symptoms and action to take for hypo/hyperglycemia.;Provide education about proper nutrition, including hydration, and aerobic/resistive exercise prescription along with prescribed medications to achieve blood glucose in normal ranges: Fasting glucose 65-99 mg/dL    Expected Outcomes Short Term: Participant verbalizes understanding of the signs/symptoms and immediate care of hyper/hypoglycemia, proper foot care and importance  of medication, aerobic/resistive exercise and nutrition plan for blood glucose control.;Long Term: Attainment of HbA1C < 7%.    Heart Failure Yes    Intervention Provide a combined exercise and nutrition program that is supplemented with education, support and counseling about heart failure. Directed toward relieving symptoms such as shortness of breath, decreased exercise tolerance, and extremity edema.    Expected Outcomes Improve functional capacity of life;Short term: Attendance in program 2-3 days a week with increased exercise capacity. Reported lower sodium intake. Reported increased fruit and vegetable intake. Reports medication compliance.;Short term: Daily weights obtained and reported for  increase. Utilizing diuretic protocols set by physician.;Long term: Adoption of self-care skills and reduction of barriers for early signs and symptoms recognition and intervention leading to self-care maintenance.    Hypertension Yes    Intervention Provide education on lifestyle modifcations including regular physical activity/exercise, weight management, moderate sodium restriction and increased consumption of fresh fruit, vegetables, and low fat dairy, alcohol moderation, and smoking cessation.;Monitor prescription use compliance.    Expected Outcomes Short Term: Continued assessment and intervention until BP is < 140/54m HG in hypertensive participants. < 130/892mHG in hypertensive participants with diabetes, heart failure or chronic kidney disease.;Long Term: Maintenance of blood pressure at goal levels.    Lipids Yes    Intervention Provide education and support for participant on nutrition & aerobic/resistive exercise along with prescribed medications to achieve LDL <7077mHDL >39m57m  Expected Outcomes Short Term: Participant states understanding of desired cholesterol values and is compliant with medications prescribed. Participant is following exercise prescription and nutrition guidelines.;Long Term: Cholesterol controlled with medications as prescribed, with individualized exercise RX and with personalized nutrition plan. Value goals: LDL < 70mg59mL > 40 mg.             Education:Diabetes - Individual verbal and written instruction to review signs/symptoms of diabetes, desired ranges of glucose level fasting, after meals and with exercise. Acknowledge that pre and post exercise glucose checks will be done for 3 sessions at entry of program. Flowsheet Row [facility]  Date 04/30/20  Educator [facility] Pctruction Review Code 1- Verbalizes Understanding  [States nver been diagnosed with DM and does not check BG]       Core  Components/Risk Factors/Patient Goals Review:   Goals and Risk Factor Review     Row Name 06/08/20 1411 06/24/20 1425 07/29/20 1446         Core Components/Risk Factors/Patient Goals Review   Personal Goals Review Weight Management/Obesity;Hypertension;Diabetes;Heart Failure;Lipids -- Weight Management/Obesity     Review His weight is holding steady over the last 30 days.  He denies any heart falure symptoms and he is back to sleeping on his side.  He is staying on top of his lasix and gets between 1600-2000 ml fluid each day and he continues to measure it daily.  Blood pressures have been good in class and  he checks it on occassion at home and when he feels a little off.  We talked about the benefits of track his pressures.  He does not check his sugars as he is diet controlled.  He has not had any feelings of feeling off with them. [place] Mandigorts his weight is staying steady within a 3 lb range.  He weighs daily.  BP has been good in class.  He saw cardiologist today and got a good report.  He goes back in 4 months. He has cut down  on volume of food.  [place] eats 3 to 4 small meals a day. His wife and him are on a journey to eating healthier and exercising.     Expected Outcomes Short: Continue to montior weight closely Long: Conitnue to manage heart failure. Short: continue to monitor HF symptoms Short: maintain a heart healthy diet. Long: maintain portion control independently              Core Components/Risk Factors/Patient Goals at Discharge (Final Review):   Goals and Risk Factor Review - 07/29/20 1446       Core Components/Risk Factors/Patient Goals Review   Personal Goals Review Weight Management/Obesity    Review He has cut down on volume of food.  [place] eats 3 to 4 small meals a day. His wife and him are on a journey to eating healthier and exercising.    Expected Outcomes Short: maintain a heart healthy diet. Long: maintain portion control independently             ITP  Comments:  ITP Comments     Row Name 04/30/20 0914 05/04/20 1223 05/13/20 1000 05/14/20 1407 05/19/20 1001   ITP Comments Virtual Visit completed. Patient informed on EP and RD appointment and 6 Minute walk test. Patient also informed of patient health questionnaires on My Chart. Patient Verbalizes understanding. Visit diagnosis can be found in [facility] 03/21/2020. Completed 6MWT and gym orientation. Initial ITP created and sent for review to Dr. Emily Filbert, Medical Director. 30 Day review completed. Medical Director ITP review done, changes made as directed, and signed approval by Medical Director. First full day of exercise!  Patient was oriented to gym and equipment including functions, settings, policies, and procedures.  Patient's individual exercise prescription and treatment plan were reviewed.  All starting workloads were established based on the results of the 6 minute walk test done at initial orientation visit.  The plan for exercise progression was also introduced and progression will be customized based on patient's performance and goals. Completed initial RD Consultation    [place] Name 06/10/20 0659 07/08/20 0804 07/14/20 1543 08/05/20 0845     ITP Comments 30 Day review completed. Medical Director ITP review done, changes made as directed, and signed approval by Medical Director. 30 Day review completed. Medical Director ITP review done, changes made as directed, and signed approval by Medical Director. [place] is currently undergoing chemo treatments and will be in and out of rehab. 30 Day review completed. Medical Director ITP review done, changes made as directed, and signed approval by Medical Director.             Comments:

## 2020-08-06 ENCOUNTER — Telehealth: Payer: Self-pay

## 2020-08-06 ENCOUNTER — Telehealth: Payer: Self-pay | Admitting: Cardiovascular Disease

## 2020-08-06 DIAGNOSIS — Z951 Presence of aortocoronary bypass graft: Secondary | ICD-10-CM

## 2020-08-06 NOTE — Progress Notes (Signed)
Wronski has not been sleeping due to chemo and is seeing his cardiologist for shortness of breath. Will follow up next week (week of 08/10/20)

## 2020-08-06 NOTE — Telephone Encounter (Signed)
pt would like to let DR. Oneal know that he is experiencing shrortness of breath not able to sleep at night, steroids is keeping him up

## 2020-08-06 NOTE — Telephone Encounter (Signed)
Called patient back, he states that he started his chemo, and he was doing well until about Tuesday- he had his #2 round, and states that the steroid has caused him issues with not sleeping, he did contact oncology they stated that this should resolve over time but he mentioned that he was having SOB as well. He was advised to call us about this. He states he has the shortness of breath when laying down or reclining back, he notices a tightness and it causes him issues with SOB. He denies swelling, weight gain, chest pains, does mention he was feeling fine until he had that #2 round of chemo and after this he had the SOB symptom. He was just seeing what we could do to make sure it was not cardiac.   Per last note he was to do an ECHO in 4 months- it is scheduled for October, he was not sure if this needed to be moved up or not.  I advised I would route to MD to advise, and I would call him back.

## 2020-08-06 NOTE — Telephone Encounter (Signed)
Pt contacted per request. Pt states post chemo he has not slept well. Assured pt that was related to steroids and should resolve. Pt verbalized understanding. Pt also states he is SOB. Advised pt per Dr Irene Limbo he needs to contact his cardiologist due to his recent cardiac history. Pt acknowledged. Pt will contact cardiology right away. Pt to call back for any other needs.

## 2020-08-07 ENCOUNTER — Inpatient Hospital Stay: Payer: Medicare HMO

## 2020-08-07 ENCOUNTER — Other Ambulatory Visit: Payer: Self-pay

## 2020-08-07 VITALS — BP 109/76 | HR 95 | Temp 97.7°F | Resp 18

## 2020-08-07 DIAGNOSIS — Z5112 Encounter for antineoplastic immunotherapy: Secondary | ICD-10-CM | POA: Diagnosis not present

## 2020-08-07 DIAGNOSIS — I10 Essential (primary) hypertension: Secondary | ICD-10-CM | POA: Diagnosis not present

## 2020-08-07 DIAGNOSIS — E785 Hyperlipidemia, unspecified: Secondary | ICD-10-CM | POA: Diagnosis not present

## 2020-08-07 DIAGNOSIS — Z5111 Encounter for antineoplastic chemotherapy: Secondary | ICD-10-CM | POA: Diagnosis not present

## 2020-08-07 DIAGNOSIS — E119 Type 2 diabetes mellitus without complications: Secondary | ICD-10-CM | POA: Diagnosis not present

## 2020-08-07 DIAGNOSIS — I255 Ischemic cardiomyopathy: Secondary | ICD-10-CM | POA: Diagnosis not present

## 2020-08-07 DIAGNOSIS — I252 Old myocardial infarction: Secondary | ICD-10-CM | POA: Diagnosis not present

## 2020-08-07 DIAGNOSIS — C8335 Diffuse large B-cell lymphoma, lymph nodes of inguinal region and lower limb: Secondary | ICD-10-CM | POA: Diagnosis not present

## 2020-08-07 DIAGNOSIS — K219 Gastro-esophageal reflux disease without esophagitis: Secondary | ICD-10-CM | POA: Diagnosis not present

## 2020-08-07 DIAGNOSIS — Z7189 Other specified counseling: Secondary | ICD-10-CM

## 2020-08-07 DIAGNOSIS — C8330 Diffuse large B-cell lymphoma, unspecified site: Secondary | ICD-10-CM

## 2020-08-07 MED ORDER — PEGFILGRASTIM-CBQV 6 MG/0.6ML ~~LOC~~ SOSY
6.0000 mg | PREFILLED_SYRINGE | Freq: Once | SUBCUTANEOUS | Status: AC
  Administered 2020-08-07: 6 mg via SUBCUTANEOUS

## 2020-08-07 MED ORDER — PEGFILGRASTIM-CBQV 6 MG/0.6ML ~~LOC~~ SOSY
PREFILLED_SYRINGE | SUBCUTANEOUS | Status: AC
  Filled 2020-08-07: qty 0.6

## 2020-08-07 NOTE — Telephone Encounter (Signed)
I called patient, scheduled for upcoming appointment on 07/28.  Patient verbalized understanding.

## 2020-08-07 NOTE — Patient Instructions (Signed)

## 2020-08-09 ENCOUNTER — Encounter: Payer: Self-pay | Admitting: Hematology

## 2020-08-10 ENCOUNTER — Other Ambulatory Visit: Payer: Self-pay

## 2020-08-10 ENCOUNTER — Telehealth: Payer: Self-pay | Admitting: Hematology

## 2020-08-10 DIAGNOSIS — Z951 Presence of aortocoronary bypass graft: Secondary | ICD-10-CM

## 2020-08-10 DIAGNOSIS — I252 Old myocardial infarction: Secondary | ICD-10-CM | POA: Diagnosis not present

## 2020-08-10 DIAGNOSIS — Z48812 Encounter for surgical aftercare following surgery on the circulatory system: Secondary | ICD-10-CM | POA: Diagnosis not present

## 2020-08-10 NOTE — Telephone Encounter (Signed)
Scheduled follow-up appointments per 7/11 los. Patient's wife is aware.

## 2020-08-10 NOTE — Progress Notes (Signed)
Daily Session Note  Patient Details  Name: Roy [name] MRN: [id number] Date of Birth: [birth year] Referring Provider:   Flowsheet Row Cardiac Rehab from 05/04/2020 in [facility] Cardiac and Pulmonary Rehab  Referring Provider [name] MD       Encounter Date: 08/10/2020  Check In:  Session Check In - 08/10/20 1406       Check-In   Supervising physician immediately available to respond to emergencies See telemetry face sheet for immediately available ER MD    Location [facility]-Cardiac & Pulmonary Rehab    Staff Present Birdie Sons, MPA, Mauricia Area, BS, ACSM CEP, Exercise Physiologist;Joseph Tessie Fass, [place]    Virtual Visit No    Medication changes reported     No    Fall or balance concerns reported    No    Warm-up and Cool-down Performed on first and last piece of equipment    Resistance Training Performed Yes    VAD Patient? No    PAD/SET Patient? No      Pain Assessment   Currently in Pain? No/denies                Social History   Tobacco Use  Smoking Status Former   Types: Pipe   Quit date: 11/29/1976   Years since quitting: 43.7  Smokeless Tobacco Never    Goals Met:  Independence with exercise equipment Exercise tolerated well No report of cardiac concerns or symptoms Strength training completed today  Goals Unmet:  Not Applicable  Comments: Pt able to follow exercise prescription today without complaint.  Will continue to monitor for progression.    Dr. Emily Filbert is Medical Director for [place].  Dr. Ottie Glazier is Medical Director for [facility] Pulmonary Rehabilitation.

## 2020-08-12 ENCOUNTER — Other Ambulatory Visit: Payer: Self-pay

## 2020-08-12 ENCOUNTER — Encounter: Payer: Medicare HMO | Admitting: *Deleted

## 2020-08-12 DIAGNOSIS — I252 Old myocardial infarction: Secondary | ICD-10-CM | POA: Diagnosis not present

## 2020-08-12 DIAGNOSIS — Z48812 Encounter for surgical aftercare following surgery on the circulatory system: Secondary | ICD-10-CM | POA: Diagnosis not present

## 2020-08-12 DIAGNOSIS — Z951 Presence of aortocoronary bypass graft: Secondary | ICD-10-CM

## 2020-08-12 NOTE — Progress Notes (Signed)
Daily Session Note  Patient Details  Name: Roy [name] MRN: [id number] Date of Birth: [birth year] Referring Provider:   Flowsheet Row Cardiac Rehab from 05/04/2020 in [facility] Cardiac and Pulmonary Rehab  Referring Provider [name] MD       Encounter Date: 08/12/2020  Check In:  Session Check In - 08/12/20 1446       Check-In   Supervising physician immediately available to respond to emergencies See telemetry face sheet for immediately available ER MD    Location [facility]-Cardiac & Pulmonary Rehab    Staff Present Heath Lark, RN, BSN, Jacklynn Bue, MS, ASCM CEP, Exercise Physiologist;Joseph Tessie Fass, Cristopher Estimable, RN BSN    Virtual Visit No    Medication changes reported     No    Fall or balance concerns reported    No    Warm-up and Cool-down Performed on first and last piece of equipment    Resistance Training Performed Yes    VAD Patient? No    PAD/SET Patient? No      Pain Assessment   Currently in Pain? No/denies                Social History   Tobacco Use  Smoking Status Former   Types: Pipe   Quit date: 11/29/1976   Years since quitting: 43.7  Smokeless Tobacco Never    Goals Met:  Independence with exercise equipment Exercise tolerated well No report of cardiac concerns or symptoms  Goals Unmet:  Not Applicable  Comments: Pt able to follow exercise prescription today without complaint.  Will continue to monitor for progression.    Dr. Emily Filbert is Medical Director for [place].  Dr. Ottie Glazier is Medical Director for [facility] Pulmonary Rehabilitation.

## 2020-08-14 ENCOUNTER — Other Ambulatory Visit: Payer: Self-pay

## 2020-08-14 DIAGNOSIS — C8330 Diffuse large B-cell lymphoma, unspecified site: Secondary | ICD-10-CM

## 2020-08-17 ENCOUNTER — Other Ambulatory Visit: Payer: Self-pay

## 2020-08-17 ENCOUNTER — Inpatient Hospital Stay: Payer: Medicare HMO

## 2020-08-17 DIAGNOSIS — I10 Essential (primary) hypertension: Secondary | ICD-10-CM | POA: Diagnosis not present

## 2020-08-17 DIAGNOSIS — Z5112 Encounter for antineoplastic immunotherapy: Secondary | ICD-10-CM | POA: Diagnosis not present

## 2020-08-17 DIAGNOSIS — E119 Type 2 diabetes mellitus without complications: Secondary | ICD-10-CM | POA: Diagnosis not present

## 2020-08-17 DIAGNOSIS — Z95828 Presence of other vascular implants and grafts: Secondary | ICD-10-CM

## 2020-08-17 DIAGNOSIS — K219 Gastro-esophageal reflux disease without esophagitis: Secondary | ICD-10-CM | POA: Diagnosis not present

## 2020-08-17 DIAGNOSIS — E785 Hyperlipidemia, unspecified: Secondary | ICD-10-CM | POA: Diagnosis not present

## 2020-08-17 DIAGNOSIS — I252 Old myocardial infarction: Secondary | ICD-10-CM | POA: Diagnosis not present

## 2020-08-17 DIAGNOSIS — C8335 Diffuse large B-cell lymphoma, lymph nodes of inguinal region and lower limb: Secondary | ICD-10-CM | POA: Diagnosis not present

## 2020-08-17 DIAGNOSIS — Z5111 Encounter for antineoplastic chemotherapy: Secondary | ICD-10-CM | POA: Diagnosis not present

## 2020-08-17 DIAGNOSIS — I255 Ischemic cardiomyopathy: Secondary | ICD-10-CM | POA: Diagnosis not present

## 2020-08-17 DIAGNOSIS — C8330 Diffuse large B-cell lymphoma, unspecified site: Secondary | ICD-10-CM

## 2020-08-17 LAB — CBC WITH DIFFERENTIAL (CANCER CENTER ONLY)
Abs Immature Granulocytes: 0.57 10*3/uL — ABNORMAL HIGH (ref 0.00–0.07)
Basophils Absolute: 0.1 10*3/uL (ref 0.0–0.1)
Basophils Relative: 1 %
Eosinophils Absolute: 0.1 10*3/uL (ref 0.0–0.5)
Eosinophils Relative: 0 %
HCT: 32.1 % — ABNORMAL LOW (ref 39.0–52.0)
Hemoglobin: 10.4 g/dL — ABNORMAL LOW (ref 13.0–17.0)
Immature Granulocytes: 2 %
Lymphocytes Relative: 5 %
Lymphs Abs: 1.2 10*3/uL (ref 0.7–4.0)
MCH: 27.4 pg (ref 26.0–34.0)
MCHC: 32.4 g/dL (ref 30.0–36.0)
MCV: 84.7 fL (ref 80.0–100.0)
Monocytes Absolute: 1.6 10*3/uL — ABNORMAL HIGH (ref 0.1–1.0)
Monocytes Relative: 7 %
Neutro Abs: 21.1 10*3/uL — ABNORMAL HIGH (ref 1.7–7.7)
Neutrophils Relative %: 85 %
Platelet Count: 235 10*3/uL (ref 150–400)
RBC: 3.79 MIL/uL — ABNORMAL LOW (ref 4.22–5.81)
RDW: 16.8 % — ABNORMAL HIGH (ref 11.5–15.5)
WBC Count: 24.6 10*3/uL — ABNORMAL HIGH (ref 4.0–10.5)
nRBC: 0.2 % (ref 0.0–0.2)

## 2020-08-17 LAB — CMP (CANCER CENTER ONLY)
ALT: 19 U/L (ref 0–44)
AST: 14 U/L — ABNORMAL LOW (ref 15–41)
Albumin: 3.5 g/dL (ref 3.5–5.0)
Alkaline Phosphatase: 109 U/L (ref 38–126)
Anion gap: 15 (ref 5–15)
BUN: 12 mg/dL (ref 8–23)
CO2: 27 mmol/L (ref 22–32)
Calcium: 8.2 mg/dL — ABNORMAL LOW (ref 8.9–10.3)
Chloride: 102 mmol/L (ref 98–111)
Creatinine: 1.21 mg/dL (ref 0.61–1.24)
GFR, Estimated: >60 mL/min (ref >60)
Glucose, Bld: 110 mg/dL — ABNORMAL HIGH (ref 70–99)
Potassium: 3.3 mmol/L — ABNORMAL LOW (ref 3.5–5.1)
Sodium: 144 mmol/L (ref 135–145)
Total Bilirubin: 0.7 mg/dL (ref 0.3–1.2)
Total Protein: 6.8 g/dL (ref 6.5–8.1)

## 2020-08-17 MED ORDER — SODIUM CHLORIDE 0.9% FLUSH
10.0000 mL | Freq: Once | INTRAVENOUS | Status: AC
  Administered 2020-08-17: 10 mL
  Filled 2020-08-17: qty 10

## 2020-08-17 MED ORDER — HEPARIN SOD (PORK) LOCK FLUSH 100 UNIT/ML IV SOLN
500.0000 [IU] | Freq: Once | INTRAVENOUS | Status: AC
  Administered 2020-08-17: 500 [IU]
  Filled 2020-08-17: qty 5

## 2020-08-17 NOTE — Progress Notes (Signed)
[name]    HEMATOLOGY/ONCOLOGY CLINIC NOTE  Date of Service: 08/17/2020  Patient Care Team: [name], DO as PCP - General (Family Medicine) O'Neal, [name], MD as PCP - Cardiology (Cardiology)  CHIEF COMPLAINTS/PURPOSE OF CONSULTATION:  Primary testicular large B cell lymphoma  HISTORY OF PRESENTING ILLNESS:   Roy [name] is a wonderful 75 y.o. male who has been referred to [place] by Dr Maudie Mercury for evaluation and management of primary testicular large B-cell lymphoma  Patient has a history of hypertension, borderline diabetes, previous nonischemic cardiomyopathy but with a recent MI,, CHF who presented with a left testicular swelling end of sept/ early oct 2021.  He notes that he was seen by Urology - Dr Minette Brine and had an ultrasound--possible infection was suspected and he received 2 rounds of abx without significant improvement.  He notes he received 2 subsequent follow-up ultrasounds in December in January and finally had left transinguinal orchiectomy on 03/11/2020. He notes he had persistent testicular swelling post operatively which required drainage.  He also notes having issues with urinary retention and had a urinary catheter for 2 weeks, abx+  Pathology showed primary testicular large B-cell lymphoma.  No other additional imaging studies for staging have been done at this time.  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  Patient was admitted to the hospital on 03/21/2020 with acute onset dyspnea epigastric pain and cough with pink frothy sputum.  He was noted to have hypoxia with oxygen saturation of 82% on arrival.  He was noted to have non-ST elevation MI on 2/26.  CTA chest ruled out PE.  His symptoms from fluid with aggressive diuresis.  He was noted to have severe triple-vessel disease on cath with an ejection fraction of 25 to 35% with no significant valvular lesions.  He subsequently had CORONARY ARTERY BYPASS  GRAFTING (CABG), ON PUMP, TIMES FOUR, USING BILATERAL INTERNAL MAMMARY ARTERIES AND LEFT RADIAL ARTERY .. On March 25, 2020.  Patient notes he is gradually recovering from his cardiac surgery notes being fatigued having gone through multiple urologic issues, skin surgery for squamous of carcinoma of the nose, acute MI CABG x4.  Patient notes no fevers no chills no night sweats. He notes he might have lost some weight but difficult to quantify in the setting of fluid overload and diuresis. Notes he is starting to eat a little better. Minimal left scrotal swelling. No other acute new focal symptoms.  INTERVAL HISTORY  I connected with [name] on 08/18/2020 by telephone and verified that I am speaking with the correct person using two identifiers.   I discussed the limitations of evaluation and management by telemedicine. The patient expressed understanding and agreed to proceed.   Other persons participating in the visit and their role in the encounter:                                                         - Reinaldo Raddle, Medical Scribe     Patient's location: Home Provider's location: [place] at [employer] is a wonderful 75 y.o. male who is here today for evaluation and management of Primary testicular large B cell lymphoma and toxicity check  after C2 of R-CEOP. The patient's last visit with [place] was  on 08/03/2020. The pt reports that he is doing well overall.  The pt reports grade 1 fatigue. No fevers/n/v/d. No other acute new symptoms. Labs steady. Patient agreeable to consideration of IT MTX for CNS prophylaxis  Lab results 08/17/2020 of CBC w/diff and CMP is as follows: all values are WNL except for WBC of 24.6K, RBC of 3.79, Hgb of 10.4, HCT of 32.1, RDW of 16.8, Neutro Abs of 21.1K, Monocytes Abs of 1.6K, Potassium of 3.3, Glucose of 110, Calcium of 8.2, AST of 14.  On review of systems, pt reports no other acute new symptoms.   MEDICAL HISTORY:  Past Medical  History:  Diagnosis Date   Allergic rhinitis    Arthritis    wrists   Cardiomyopathy, nonischemic [facility])    followed by cardiology--- dr [name]---  2016 ef 45-50% ,  2016 nuclear ef 37%,  2017 per echo ef 50-55%   CHF (congestive heart failure), NYHA class III ([place]) 03/21/2020   Coronary artery disease    GERD (gastroesophageal reflux disease)    Hiatal hernia    History of kidney stones    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;   01/ 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   History of syncope (03-06-2020 pt stated has not had sycopal episode in few years, stated it seems to happen in extreme hot conditions)   cardiologist--- dr [name]--- dx recurrent syncope;  nuclear study 11-03-2014 intermediate risk w/ no ischemia, apical hypokinesis, nuclear ef 37%;  event monitor-- 12-21-2015 SB/ ST  no pauses/ arrythmia's;  echo 08-03-2015 ef 50-55%   Hyperlipidemia    Hypertension    followed by pcp   Hypovitaminosis D    Mass of left testicle    Nocturia    Plantar fasciitis    Presence of surgical incision    01/ 2022  moh's w/ skin graft of nose, per pt still healing and wear bandage daily   Type 2 diabetes mellitus ([place])    pt is adament that he is not and have been told he is a diabetic but a borderline;  followed by pcp, in pcp note states DM2 and takes 2 meds daily   Wears glasses    Wears hearing aid in both ears     SURGICAL HISTORY: Past Surgical History:  Procedure Laterality Date   COLONOSCOPY  last one 01-30-2017   CORONARY ARTERY BYPASS GRAFT N/A 03/25/2020   Procedure: CORONARY ARTERY BYPASS GRAFTING (CABG), ON PUMP, TIMES FOUR, USING BILATERAL INTERNAL MAMMARY ARTERIES AND LEFT RADIAL ARTERY;  Surgeon: [name], MD;  Location: [place];  Service: Open Heart Surgery;  Laterality: N/A;   IR IMAGING GUIDED PORT INSERTION  07/06/2020   LOW ANTERIOR RESECTION RECTUM W/ COLOPROCTOSTOMY  05/2003   MOHS SURGERY  01/2020   nose w/ graft    ORCHIECTOMY Left 03/11/2020   Procedure: [place];  Surgeon: [name], MD;  Location: [facility];  Service: Urology;  Laterality: Left;  ONLY NEEDS 60 MIN   RADIAL ARTERY HARVEST Left 03/25/2020   Procedure: LEFT RADIAL ARTERY HARVEST;  Surgeon: [name], MD;  Location: [place];  Service: Open Heart Surgery;  Laterality: Left;   RIGHT/LEFT HEART CATH AND CORONARY ANGIOGRAPHY N/A 03/23/2020   Procedure: RIGHT/LEFT HEART CATH AND CORONARY ANGIOGRAPHY;  Surgeon: [name], MD;  Location: [place] [facility];  Service: Cardiovascular;  Laterality: N/A;   SHOULDER SURGERY  Right 1992; 07/ 2021   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 Left 12/24/2008   left ear and nose    TEE WITHOUT CARDIOVERSION N/A 03/25/2020   Procedure: TRANSESOPHAGEAL ECHOCARDIOGRAM (TEE);  Surgeon: [name], MD;  Location: [place];  Service: Open Heart Surgery;  Laterality: N/A;   TONSILLECTOMY AND ADENOIDECTOMY  child   UPPER GASTROINTESTINAL ENDOSCOPY  last one 06-06-2017    SOCIAL HISTORY: Social History   Socioeconomic History   Marital status: Married    Spouse name: Not on file   Number of children: Not on file   Years of education: Not on file   Highest education level: Not on file  Occupational History   Not on file  Tobacco Use   Smoking status: Former    Types: Pipe    Quit date: 11/29/1976    Years since quitting: 43.7   Smokeless tobacco: Never  Vaping Use   Vaping Use: Never used  Substance and Sexual Activity   Alcohol use: Yes    Alcohol/week: 0.0 standard drinks    Comment: Occasional drink    Drug use: Never   Sexual activity: Not on file  Other Topics Concern   Not on file  Social History Narrative   Not on file   Social Determinants of Health   Financial Resource Strain: Not on file  Food Insecurity: Not on file  Transportation Needs: Not on file  Physical Activity: Not on file  Stress: Not on file  Social  Connections: Not on file  Intimate Partner Violence: Not on file    FAMILY HISTORY: Family History  Problem Relation Age of Onset   Heart attack Mother    Stroke Father    Colon cancer Neg Hx    Esophageal cancer Neg Hx    Pancreatic cancer Neg Hx    Rectal cancer Neg Hx    Stomach cancer Neg Hx    Colon polyps Neg Hx     ALLERGIES:  has No Known Allergies.  MEDICATIONS:  Current Outpatient Medications  Medication Sig Dispense Refill   acetaminophen (TYLENOL) 325 MG tablet Take 2 tablets (650 mg total) by mouth every 4 (four) hours as needed for headache or mild pain.     Apoaequorin (PREVAGEN PO) Take 1 tablet by mouth at bedtime.     aspirin EC 81 MG tablet Take 81 mg by mouth daily. Swallow whole.     atorvastatin (LIPITOR) 40 MG tablet Take 1 tablet (40 mg total) by mouth daily. 90 tablet 3   cholecalciferol (VITAMIN D) 1000 UNITS tablet Take 1,000 Units by mouth See admin instructions. Mon-friday     empagliflozin (JARDIANCE) 10 MG TABS tablet Take 1 tablet (10 mg total) by mouth daily before breakfast. 90 tablet 1   furosemide (LASIX) 80 MG tablet Take 0.5 tablets (40 mg total) by mouth daily. 30 tablet 11   glucosamine-chondroitin 500-400 MG tablet Take 1 tablet by mouth. 5 times weekly     LORazepam (ATIVAN) 0.5 MG tablet Take 1 tablet (0.5 mg total) by mouth every 6 (six) hours as needed for anxiety. Put 1 pill under your tongue every 6 hrs, if needed, for nausea 30 tablet 0   metFORMIN (GLUCOPHAGE) 1000 MG tablet Take 1,000 mg by mouth 2 (two) times daily with a meal.      metoprolol (TOPROL XL) 200 MG 24 hr tablet Take 1 tablet (200 mg total) by mouth daily. 90 tablet 3   Multiple Vitamin (MULTIVITAMIN WITH  MINERALS) TABS tablet Take 1 tablet by mouth. 5 times weekly     omeprazole (PRILOSEC) 20 MG capsule Take 20 mg by mouth daily.     ondansetron (ZOFRAN) 8 MG tablet Take 1 tablet (8 mg total) by mouth every 8 (eight) hours as needed for nausea or vomiting. Take 1 pill  every 8 hrs for 3 days. 30 tablet 3   prochlorperazine (COMPAZINE) 10 MG tablet Take 1 tablet (10 mg total) by mouth every 8 (eight) hours as needed for nausea or vomiting. 40 tablet 2   sacubitril-valsartan (ENTRESTO) 24-26 MG Take 1 tablet by mouth 2 (two) times daily. 60 tablet 3   spironolactone (ALDACTONE) 25 MG tablet Take 0.5 tablets (12.5 mg total) by mouth daily. 60 tablet 1   tamsulosin (FLOMAX) 0.4 MG CAPS capsule Take 0.4 mg by mouth at bedtime.     vitamin B-12 (CYANOCOBALAMIN) 1000 MCG tablet Take 1,000 mcg by mouth. 5 times a week     Current Facility-Administered Medications  Medication Dose Route Frequency Provider Last Rate Last Admin   potassium chloride (KLOR-CON) CR tablet 20 mEq  20 mEq Oral BID [name], MD        REVIEW OF SYSTEMS:    10 Point review of Systems was done is negative except as noted above.  PHYSICAL EXAMINATION: ECOG PERFORMANCE STATUS: 2 - Symptomatic, <50% confined to bed  . There were no vitals filed for this visit.  There were no vitals filed for this visit.  .There is no height or weight on file to calculate BMI.  Telehealth Visit.  LABORATORY DATA:  I have reviewed the data as listed   CBC Latest Ref Rng & Units 08/17/2020 08/03/2020 07/23/2020  WBC 4.0 - 10.5 K/uL 24.6(H) 18.7(H) 22.9(H)  Hemoglobin 13.0 - 17.0 g/dL 10.4(L) 10.0(L) 10.2(L)  Hematocrit 39.0 - 52.0 % 32.1(L) 32.0(L) 32.2(L)  Platelets 150 - 400 K/uL 235 325 259    . CMP Latest Ref Rng & Units 08/17/2020 08/03/2020 07/23/2020  Glucose 70 - 99 mg/dL 110(H) 177(H) 175(H)  BUN 8 - 23 mg/dL $Remove'12 18 10  'vBrenoW$ Creatinine 0.61 - 1.24 mg/dL 1.21 1.56(H) 1.23  Sodium 135 - 145 mmol/L 144 144 142  Potassium 3.5 - 5.1 mmol/L 3.3(L) 3.6 3.3(L)  Chloride 98 - 111 mmol/L 102 108 106  CO2 22 - 32 mmol/L $RemoveB'27 22 25  'uCzHighU$ Calcium 8.9 - 10.3 mg/dL 8.2(L) 7.8(L) 8.5(L)  Total Protein 6.5 - 8.1 g/dL 6.8 6.7 7.0  Total Bilirubin 0.3 - 1.2 mg/dL 0.7 1.0 0.9  Alkaline Phos 38 - 126 U/L 109  86 107  AST 15 - 41 U/L 14(L) 17 14(L)  ALT 0 - 44 U/L $Remo'19 22 30   'aiJip$ . Lab Results  Component Value Date   LDH 199 (H) 07/23/2020   SURGICAL PATHOLOGY   THIS IS AN ADDENDUM REPORT   CASE: [id number]  PATIENT: Jencarlos Feeny  Surgical Pathology Report  Addendum    Reason for Addendum #1:  Molecular Genetic Test Results, FISH   Clinical History: Left testicular mass (crm)      FINAL MICROSCOPIC DIAGNOSIS:   A. TESTICLE, LEFT, ORCHIECTOMY:  -  Diffuse aggressive large B-cell lymphoma  -  See comment    COMMENT:   Sections of testicle show architectural effacement by sheets of large  lymphoid cells with vesicular nuclei and pale cytoplasm.  There is  admixed apoptotic debris and increased mitotic activity.  By  immunohistochemistry, the large lymphoid cells are positive for  CD20,  CD5 (dim), BCL6 (dim), MUM1, and BCL2.  They are negative for CD10, CD30  (<1%), cyclin D1, and TdT.  The Ki67 proliferation index is up to  approximately 70%.  CD3 highlights small T-cells in the background. EBV  is negative by in situ hybridization.   Together, the findings support the diagnosis of a diffuse aggressive  large B-cell lymphoma. The differential diagnosis includes a diffuse  large B-cell lymphoma, NOS, with activated B-cell subtype by the [facility]  algorithm and high-grade B-cell lymphoma with MYC and BCL2 or BCL6  rearrangements.  FISH for BCL2, BCL6, and MYC rearrangements will be  performed. Correlation with clinical and radiographic findings is  recommended for consideration of a primary testicular lymphoma.   Result reported to L. Gibson on 03/16/20 at 1720 by S. O'Neill.    ADDENDUM:   FISH RESULTS:   Results: NORMAL   Interpretation:   BCL6 rearrangement:      Not Detected  MYC rearrangement:  Not Detected  MYC amplification:  Not Detected  BCL6 rearrangement:      Not Detected   Please note this testing was performed and interpreted by an outside  facility  ([facility]).  This addendum is only being added to provide a  summary of the results for report completeness.  Please see electronic  medical record for a copy of the full report.   RADIOGRAPHIC STUDIES: I have personally reviewed the radiological images as listed and agreed with the findings in the report. US SCROTUM W/DOPPLER  Result Date: 07/23/2020 CLINICAL DATA:  Right testicle pain EXAM: SCROTAL ULTRASOUND DOPPLER ULTRASOUND OF THE TESTICLES TECHNIQUE: Complete ultrasound examination of the testicles, epididymis, and other scrotal structures was performed. Color and spectral Doppler ultrasound were also utilized to evaluate blood flow to the testicles. COMPARISON:  01/07/2020 FINDINGS: Right testicle Measurements: 4.2 x 1.7 x 3.0 cm. No mass or microlithiasis visualized. Left testicle Measurements:  Status post left orchiectomy. Right epididymis:  Normal in size and appearance. Left epididymis:  Not visualized Hydrocele:  None visualized. Varicocele:  None visualized. Pulsed Doppler interrogation of both testes demonstrates normal low resistance arterial and venous waveforms bilaterally. Other: There is a solid echogenic mass with a 3.9 cm peripheral echogenic nodule measuring 2.4 x 1.6 x 2.1 cm. No significant increased blood flow identified within this structure. IMPRESSION: 1. No signs of right testicular mass or torsion. 2. Indeterminate, solid echogenic mass within the left hemiscrotum status post left orchiectomy for lymphoma. Primary differential considerations include residual postoperative change versus recurrent lymphoma. Contrast enhanced MRI of the scrotum may be helpful to differentiate if clinically indicated. Electronically Signed   By: Kerby Moors M.D.   On: 07/23/2020 14:49    ASSESSMENT & PLAN:   75 year old wonderful gentleman with history of hypertension, borderline diabetes, dyslipidemia, GERD with recent STEMI on 03/21/2020 and status post four-vessel CABG on  03/25/2020.  1) Left-sided primary testicular large B-cell lymphoma. Staging to be determined. BCL-2, BCL 6, c-Myc negative.  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 -grade 1-2 fatigue but no other prohibitive toxicity from C2 of treatment -will keep chemotherapy doses for C3 the same as C2 -continue G-CSF support -discussed  and patient is agreeable to add IT MTX for CNS prophylaxis from C3 -- will schedule around D18  -Continue follow-up with primary, urology and cardiology for continued management of other medical issues.  FOLLOW UP:  F/u as scheduled for cycle 3 of R-CEOP chemotherapy Will need to schedule C3D18 Intrathecal methotrexate administration by IR. F/u for C4 of R-CEOP chemotherapy as scheduled MD visit with C4D1 of treatment.    All of the patients questions were answered with apparent satisfaction. The patient knows to call the clinic with any problems, questions or concerns.   The total time spent in the appointment was 30 minutes and more than 50% was on counseling and direct patient care, ordering and mx of chemotherapy    Sullivan Lone MD [place] AAHIVMS [facility] Hematology/Oncology Physician [facility]  (Office):       [phone] (Work cell):  [phone] (Fax):           [phone]  08/17/2020 7:03 PM  I, Reinaldo Raddle, am acting as scribe for Dr. Sullivan Lone, MD. .I have reviewed the above documentation for accuracy and completeness, and I agree with the above. Brunetta Genera MD

## 2020-08-18 ENCOUNTER — Ambulatory Visit: Payer: Medicare HMO

## 2020-08-18 ENCOUNTER — Inpatient Hospital Stay (HOSPITAL_BASED_OUTPATIENT_CLINIC_OR_DEPARTMENT_OTHER): Payer: Medicare HMO | Admitting: Hematology

## 2020-08-18 DIAGNOSIS — I252 Old myocardial infarction: Secondary | ICD-10-CM | POA: Diagnosis not present

## 2020-08-18 DIAGNOSIS — E785 Hyperlipidemia, unspecified: Secondary | ICD-10-CM | POA: Diagnosis not present

## 2020-08-18 DIAGNOSIS — I10 Essential (primary) hypertension: Secondary | ICD-10-CM | POA: Diagnosis not present

## 2020-08-18 DIAGNOSIS — E119 Type 2 diabetes mellitus without complications: Secondary | ICD-10-CM | POA: Diagnosis not present

## 2020-08-18 DIAGNOSIS — C8330 Diffuse large B-cell lymphoma, unspecified site: Secondary | ICD-10-CM | POA: Diagnosis not present

## 2020-08-18 DIAGNOSIS — Z5111 Encounter for antineoplastic chemotherapy: Secondary | ICD-10-CM | POA: Diagnosis not present

## 2020-08-18 DIAGNOSIS — I255 Ischemic cardiomyopathy: Secondary | ICD-10-CM | POA: Diagnosis not present

## 2020-08-18 DIAGNOSIS — K219 Gastro-esophageal reflux disease without esophagitis: Secondary | ICD-10-CM | POA: Diagnosis not present

## 2020-08-18 DIAGNOSIS — C8335 Diffuse large B-cell lymphoma, lymph nodes of inguinal region and lower limb: Secondary | ICD-10-CM | POA: Diagnosis not present

## 2020-08-18 DIAGNOSIS — Z5112 Encounter for antineoplastic immunotherapy: Secondary | ICD-10-CM | POA: Diagnosis not present

## 2020-08-19 ENCOUNTER — Ambulatory Visit: Payer: Medicare HMO

## 2020-08-19 ENCOUNTER — Other Ambulatory Visit: Payer: Self-pay

## 2020-08-19 DIAGNOSIS — Z951 Presence of aortocoronary bypass graft: Secondary | ICD-10-CM

## 2020-08-19 DIAGNOSIS — I252 Old myocardial infarction: Secondary | ICD-10-CM | POA: Diagnosis not present

## 2020-08-19 DIAGNOSIS — Z48812 Encounter for surgical aftercare following surgery on the circulatory system: Secondary | ICD-10-CM | POA: Diagnosis not present

## 2020-08-19 NOTE — Progress Notes (Signed)
Daily Session Note  Patient Details  Name: Roy [name] MRN: [id number] Date of Birth: February 16, 1945 Referring Provider:   Flowsheet Row Cardiac Rehab from 05/04/2020 in [facility] Cardiac and Pulmonary Rehab  Referring Provider [name] MD       Encounter Date: 08/19/2020  Check In:  Session Check In - 08/19/20 1412       Check-In   Supervising physician immediately available to respond to emergencies See telemetry face sheet for immediately available ER MD    Location [facility]-Cardiac & Pulmonary Rehab    Staff Present Birdie Sons, MPA, Nino Glow, MS, ASCM CEP, Exercise Physiologist;Joseph [place], [place];Heath Lark, RN, BSN, CCRP    Virtual Visit No    Medication changes reported     No    Fall or balance concerns reported    No    Warm-up and Cool-down Performed on first and last piece of equipment    Resistance Training Performed Yes    VAD Patient? No    PAD/SET Patient? No      Pain Assessment   Currently in Pain? No/denies                Social History   Tobacco Use  Smoking Status Former   Types: Pipe   Quit date: 11/29/1976   Years since quitting: 43.7  Smokeless Tobacco Never    Goals Met:  Independence with exercise equipment Exercise tolerated well Personal goals reviewed No report of cardiac concerns or symptoms Strength training completed today  Goals Unmet:  Not Applicable  Comments: Pt able to follow exercise prescription today without complaint.  Will continue to monitor for progression.    Dr. Emily Filbert is Medical Director for [facility].  Dr. Ottie Glazier is Medical Director for [facility] Pulmonary Rehabilitation.

## 2020-08-19 NOTE — Progress Notes (Signed)
Cardiology Office Note:   Date:  08/20/2020  NAME:  Roy [name]    MRN: [id number] DOB:  11/01/1945   PCP:  [name], DO  Cardiologist:  [name], MD  Electrophysiologist:  None   Referring MD: [name], DO   Chief Complaint  Patient presents with   Follow-up   History of Present Illness:   Roy [name] is a 75 y.o. male with a hx of CAD s/p CABG, ischemic CM (20-25%), HLD, DM, testicular CA who presents for follow-up.  He reports he had some shortness of breath with laying down after the chemotherapy cycle.  He had a chemotherapy cycle between 08/03/2020 and 08/05/2020.  Apparently etoposide was increased.  He reports that he would become short of breath with laying down.  He has had no increased weight gain or increased edema in his legs.  He reports that his weights are down to 192.  He has been up above 200 in the past.  He reports that his symptoms resolved after he got through that cycle of chemotherapy.  His most recent chest x-ray does show pleural effusions.  He was released by cardiac surgery.  I think he needs a repeat x-ray.  He does report a cough since surgery.  Suspect he may have residual pleural effusion and may need a CT scan.  His blood pressure is 108/60.  Heart rate 102.  Oxygen saturations 97%.  Again weights are stable.  Symptoms have resolved.  Has had no further symptoms.  He is walking 10 to 15 minutes 2 times per day without any significant symptoms.  He is also doing cardiac rehab 2 times per week.  Sessions are 35 to 40 minutes.  Denies any chest pain or shortness of breath.  He is tolerating his medications well.  No dizziness or lightheadedness.  EKG in office demonstrates sinus tachycardia heart rate 102 with LVH changes that of been consistent with prior tracings.  Problem List 1. 3vCAD -CABG x 4 03/25/2020 (LIMA-LAD, RIMA-PDA, radial artery OM1/2) 2. Systolic HF, ischemic -A999333: 25-30% -4//2022: 20-25% 3. DM -A1c 6.2 4. HLD -T chol 140,  HDL 36, LDL 74, TG 148 5. Primary testicular large B-cell lymphoma   Past Medical History: Past Medical History:  Diagnosis Date   Allergic rhinitis    Arthritis    wrists   Cardiomyopathy, nonischemic ([place])    followed by cardiology--- dr [name]---  2016 ef 45-50% ,  2016 nuclear ef 37%,  2017 per echo ef 50-55%   CHF (congestive heart failure), NYHA class III ([place]) 03/21/2020   Coronary artery disease    GERD (gastroesophageal reflux disease)    Hiatal hernia    History of kidney stones    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;   01/ 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   History of syncope (03-06-2020 pt stated has not had sycopal episode in few years, stated it seems to happen in extreme hot conditions)   cardiologist--- dr [name]--- dx recurrent syncope;  nuclear study 11-03-2014 intermediate risk w/ no ischemia, apical hypokinesis, nuclear ef 37%;  event monitor-- 12-21-2015 SB/ ST  no pauses/ arrythmia's;  echo 08-03-2015 ef 50-55%   Hyperlipidemia    Hypertension    followed by pcp   Hypovitaminosis D    Mass of left testicle    Nocturia    Plantar fasciitis    Presence of surgical incision  01/ 2022  moh's w/ skin graft of nose, per pt still healing and wear bandage daily   Type 2 diabetes mellitus ([place])    pt is adament that he is not and have been told he is a diabetic but a borderline;  followed by pcp, in pcp note states DM2 and takes 2 meds daily   Wears glasses    Wears hearing aid in both ears     Past Surgical History: Past Surgical History:  Procedure Laterality Date   COLONOSCOPY  last one 01-30-2017   CORONARY ARTERY BYPASS GRAFT N/A 03/25/2020   Procedure: CORONARY ARTERY BYPASS GRAFTING (CABG), ON PUMP, TIMES FOUR, USING BILATERAL INTERNAL MAMMARY ARTERIES AND LEFT RADIAL ARTERY;  Surgeon: [name], MD;  Location: [facility];  Service: Open Heart Surgery;  Laterality: N/A;   IR IMAGING GUIDED PORT INSERTION   07/06/2020   LOW ANTERIOR RESECTION RECTUM W/ COLOPROCTOSTOMY  05/2003   MOHS SURGERY  01/2020   nose w/ graft   ORCHIECTOMY Left 03/11/2020   Procedure: [place];  Surgeon: [name], MD;  Location: [facility];  Service: Urology;  Laterality: Left;  ONLY NEEDS 60 MIN   RADIAL ARTERY HARVEST Left 03/25/2020   Procedure: LEFT RADIAL ARTERY HARVEST;  Surgeon: [name], MD;  Location: [facility];  Service: Open Heart Surgery;  Laterality: Left;   RIGHT/LEFT HEART CATH AND CORONARY ANGIOGRAPHY N/A 03/23/2020   Procedure: RIGHT/LEFT HEART CATH AND CORONARY ANGIOGRAPHY;  Surgeon: [name], MD;  Location: [facility];  Service: Cardiovascular;  Laterality: N/A;   SHOULDER SURGERY Right 1992; 07/ 2021   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 Left 12/24/2008   left ear and nose    TEE WITHOUT CARDIOVERSION N/A 03/25/2020   Procedure: TRANSESOPHAGEAL ECHOCARDIOGRAM (TEE);  Surgeon: [name], MD;  Location: [facility];  Service: Open Heart Surgery;  Laterality: N/A;   TONSILLECTOMY AND ADENOIDECTOMY  child   UPPER GASTROINTESTINAL ENDOSCOPY  last one 06-06-2017    Current Medications: Current Meds  Medication Sig   acetaminophen (TYLENOL) 325 MG tablet Take 2 tablets (650 mg total) by mouth every 4 (four) hours as needed for headache or mild pain.   Apoaequorin (PREVAGEN PO) Take 1 tablet by mouth at bedtime.   aspirin EC 81 MG tablet Take 81 mg by mouth daily. Swallow whole.   atorvastatin (LIPITOR) 40 MG tablet Take 1 tablet (40 mg total) by mouth daily.   cholecalciferol (VITAMIN D) 1000 UNITS tablet Take 1,000 Units by mouth See admin instructions. Mon-friday   empagliflozin (JARDIANCE) 10 MG TABS tablet Take 1 tablet (10 mg total) by mouth daily before breakfast.   furosemide (LASIX) 80 MG tablet Take 0.5 tablets (40 mg total) by mouth daily.   glucosamine-chondroitin 500-400 MG tablet Take 1 tablet by mouth. 5 times  weekly   LORazepam (ATIVAN) 0.5 MG tablet Take 1 tablet (0.5 mg total) by mouth every 6 (six) hours as needed for anxiety. Put 1 pill under your tongue every 6 hrs, if needed, for nausea   metFORMIN (GLUCOPHAGE) 1000 MG tablet Take 1,000 mg by mouth 2 (two) times daily with a meal.    metoprolol (TOPROL XL) 200 MG 24 hr tablet Take 1 tablet (200 mg total) by mouth daily.   Multiple Vitamin (MULTIVITAMIN WITH MINERALS) TABS tablet Take 1 tablet by mouth. 5 times weekly   omeprazole (PRILOSEC) 20 MG capsule Take 20 mg by mouth daily.  ondansetron (ZOFRAN) 8 MG tablet Take 1 tablet (8 mg total) by mouth every 8 (eight) hours as needed for nausea or vomiting. Take 1 pill every 8 hrs for 3 days.   prochlorperazine (COMPAZINE) 10 MG tablet Take 1 tablet (10 mg total) by mouth every 8 (eight) hours as needed for nausea or vomiting.   sacubitril-valsartan (ENTRESTO) 24-26 MG Take 1 tablet by mouth 2 (two) times daily.   spironolactone (ALDACTONE) 25 MG tablet Take 0.5 tablets (12.5 mg total) by mouth daily.   tamsulosin (FLOMAX) 0.4 MG CAPS capsule Take 0.4 mg by mouth at bedtime.   vitamin B-12 (CYANOCOBALAMIN) 1000 MCG tablet Take 1,000 mcg by mouth. 5 times a week   Current Facility-Administered Medications for the 08/20/20 encounter (Office Visit) with Geralynn Rile, MD  Medication   potassium chloride (KLOR-CON) CR tablet 20 mEq     Allergies:    Patient has no known allergies.   Social History: Social History   Socioeconomic History   Marital status: Married    Spouse name: Not on file   Number of children: Not on file   Years of education: Not on file   Highest education level: Not on file  Occupational History   Not on file  Tobacco Use   Smoking status: Former    Types: Pipe    Quit date: 11/29/1976    Years since quitting: 43.7   Smokeless tobacco: Never  Vaping Use   Vaping Use: Never used  Substance and Sexual Activity   Alcohol use: Yes    Alcohol/week: 0.0  standard drinks    Comment: Occasional drink    Drug use: Never   Sexual activity: Not on file  Other Topics Concern   Not on file  Social History Narrative   Not on file   Social Determinants of Health   Financial Resource Strain: Not on file  Food Insecurity: Not on file  Transportation Needs: Not on file  Physical Activity: Not on file  Stress: Not on file  Social Connections: Not on file     Family History: The patient's family history includes Heart attack in his mother; Stroke in his father. There is no history of Colon cancer, Esophageal cancer, Pancreatic cancer, Rectal cancer, Stomach cancer, or Colon polyps.  ROS:   All other ROS reviewed and negative. Pertinent positives noted in the HPI.     EKGs/Labs/Other Studies Reviewed:   The following studies were personally reviewed by me today:  EKG:  EKG is ordered today.  The ekg ordered today demonstrates sinus tachycardia heart rate 102 LVH by voltage, nonspecific ST-T changes, and was personally reviewed by me.   TTE 05/12/2020  1. Left ventricular ejection fraction, by estimation, is 20 to 25%. Left  ventricular ejection fraction by 2D MOD biplane is 21.8 %. The left  ventricle has severely decreased function. The left ventricle demonstrates  regional wall motion abnormalities  with global hypokinesis, worse in the inferoseptal, inferior, and  inferolateral distibution. This is confirmed with regional strain mapping.  Left ventricular diastolic parameters are indeterminate. Elevated left  atrial pressure. The average left  ventricular global longitudinal strain is -10.7 %. The global longitudinal  strain is abnormal.   2. Right ventricular systolic function is mildly reduced. The right  ventricular size is normal. There is mildly elevated pulmonary artery  systolic pressure.   3. Left atrial size was mild to moderately dilated.   4. The mitral valve is normal in structure. Trivial mitral valve  regurgitation.    5. The aortic valve is tricuspid. There is mild calcification of the  aortic valve. There is mild thickening of the aortic valve. Aortic valve  regurgitation is not visualized. Mild aortic valve sclerosis is present,  with no evidence of aortic valve  stenosis.   Recent Labs: 03/21/2020: B Natriuretic Peptide 648.5 03/31/2020: Magnesium 1.6 08/17/2020: ALT 19; BUN 12; Creatinine 1.21; Hemoglobin 10.4; Platelet Count 235; Potassium 3.3; Sodium 144   Recent Lipid Panel    Component Value Date/Time   CHOL 128 07/03/2020 1028   TRIG 124 07/03/2020 1028   HDL 41 07/03/2020 1028   CHOLHDL 3.1 07/03/2020 1028   CHOLHDL 3.9 03/21/2020 1147   VLDL 30 03/21/2020 1147   LDLCALC 65 07/03/2020 1028    Physical Exam:   VS:  BP 108/60 (BP Location: Left Arm, Patient Position: Sitting, Cuff Size: Normal)   Pulse (!) 102   Ht '6\' 1"'$  (1.854 m)   Wt 192 lb (87.1 kg)   SpO2 97%   BMI 25.33 kg/m    Wt Readings from Last 3 Encounters:  08/20/20 192 lb (87.1 kg)  08/03/20 199 lb (90.3 kg)  06/24/20 199 lb 3.2 oz (90.4 kg)    General: Well nourished, well developed, in no acute distress Head: Atraumatic, normal size  Eyes: PEERLA, EOMI  Neck: Supple, no JVD Endocrine: No thryomegaly Cardiac: Normal S1, S2; RRR; no murmurs, rubs, or gallops Lungs: Clear to auscultation bilaterally, no wheezing, rhonchi or rales  Abd: Soft, nontender, no hepatomegaly  Ext: No edema, pulses 2+ Musculoskeletal: No deformities, BUE and BLE strength normal and equal Skin: Warm and dry, no rashes   Neuro: Alert and oriented to person, place, time, and situation, CNII-XII grossly intact, no focal deficits  Psych: Normal mood and affect   ASSESSMENT:   Roy [name] is a 75 y.o. male who presents for the following: 1. SOB (shortness of breath)   2. Orthopnea   3. Cough   4. Chronic systolic heart failure ([place])   5. Coronary artery disease involving coronary bypass graft of native heart without angina pectoris   6.  Mixed hyperlipidemia   7. Essential hypertension     PLAN:   1. SOB (shortness of breath) 2. Orthopnea 3. Cough -Symptoms orthopnea and cough.  Cough is occurred since surgery.  He needs repeat chest x-ray.  Possibly he has a pleural effusion.  May need CT scan. -Orthopnea has resolved.  This coincided with chemotherapy.  He is not volume up.  He has no evidence of acute congestive heart failure.  Symptoms have resolved.  I suspect this was all related to chemotherapy.  We will need to closely monitor him through his next 2 sessions. -Would not plan to change any medication at this time.  He is euvolemic. - he is tachycardic but not hypoxic.  Low suspicion for PE.  No further symptoms really.  We will keep an eye on this.  4. Chronic systolic heart failure (HCC) -Ischemic cardiomyopathy.  EF 20-25%.  CABG surgery on 03/25/2020.  He is on guideline directed medical therapy including Jardiance 10 mg daily, metoprolol succinate 20 mg daily, Entresto 24-26 mg twice daily, Aldactone 12.5 mg daily.  He is euvolemic.  We will continue with 40 mg of Lasix daily. -Plan to repeat an echocardiogram in October.  This will give him 6 months of guideline directed medical therapy.  ICD discussion after this.  5. Coronary artery disease involving coronary bypass graft of native  heart without angina pectoris -Non-STEMI in March 2022.  Had four-vessel CABG on 03/25/2020. -On aspirin.  Most recent Lipitor shows his level is at goal.  No symptoms of angina.  6. Mixed hyperlipidemia -Continue Lipitor 40 mg daily.  Most recent LDL is at goal.  7. Essential hypertension -Stable.  No change in medications.     Disposition: He will keep his follow-up appointment with me.  He will see me after he obtains an echocardiogram in a few months.  Medication Adjustments/Labs and Tests Ordered: Current medicines are reviewed at length with the patient today.  Concerns regarding medicines are outlined above.  Orders Placed  This Encounter  Procedures   DG Chest 2 View   EKG 12-Lead   No orders of the defined types were placed in this encounter.   Patient Instructions  Medication Instructions:  The current medical regimen is effective;  continue present plan and medications.  *If you need a refill on your cardiac medications before your next appointment, please call your pharmacy*   Testing/Procedures: Chest xray - Your physician has requested that you have a chest xray, is a fast and painless imaging test that uses certain electromagnetic waves to create pictures of the structures in and around your chest. This test can help diagnose and monitor conditions such as pneumonia and other lung issues his will be done at [facility], [place]. If you should need to call them their phone number is [phone].   Follow-Up: At [facility], you and your health needs are our priority.  As part of our continuing mission to provide you with exceptional heart care, we have created designated Provider Care Teams.  These Care Teams include your primary Cardiologist (physician) and Advanced Practice Providers (APPs -  Physician Assistants and Nurse Practitioners) who all work together to provide you with the care you need, when you need it.  We recommend signing up for the patient portal called "MyChart".  Sign up information is provided on this After Visit Summary.  MyChart is used to connect with patients for Virtual Visits (Telemedicine).  Patients are able to view lab/test results, encounter notes, upcoming appointments, etc.  Non-urgent messages can be sent to your provider as well.   To learn more about what you can do with MyChart, go to [email or web address].    Your next appointment:   Keep appointment in October.   Time Spent with Patient: I have spent a total of 35 minutes with patient reviewing hospital notes, telemetry, EKGs, labs and examining the patient as well as establishing  an assessment and plan that was discussed with the patient.  > 50% of time was spent in direct patient care.  Signed, Addison Naegeli. [name], MD, [facility]  [address] [place], [place] [address] [phone]  08/20/2020 9:57 AM

## 2020-08-20 ENCOUNTER — Other Ambulatory Visit: Payer: Self-pay

## 2020-08-20 ENCOUNTER — Ambulatory Visit
Admission: RE | Admit: 2020-08-20 | Discharge: 2020-08-20 | Disposition: A | Discharge status: Home / Self Care | Payer: Medicare HMO | Source: Ambulatory Visit | Attending: Cardiovascular Disease | Admitting: Cardiovascular Disease

## 2020-08-20 ENCOUNTER — Ambulatory Visit (INDEPENDENT_AMBULATORY_CARE_PROVIDER_SITE_OTHER): Payer: Medicare HMO | Admitting: Cardiovascular Disease

## 2020-08-20 ENCOUNTER — Encounter: Payer: Self-pay | Admitting: Cardiovascular Disease

## 2020-08-20 VITALS — BP 108/60 | HR 102 | Ht 73.0 in | Wt 192.0 lb

## 2020-08-20 DIAGNOSIS — I5022 Chronic systolic (congestive) heart failure: Secondary | ICD-10-CM | POA: Diagnosis not present

## 2020-08-20 DIAGNOSIS — I10 Essential (primary) hypertension: Secondary | ICD-10-CM | POA: Diagnosis not present

## 2020-08-20 DIAGNOSIS — R059 Cough, unspecified: Secondary | ICD-10-CM

## 2020-08-20 DIAGNOSIS — I25810 Atherosclerosis of coronary artery bypass graft(s) without angina pectoris: Secondary | ICD-10-CM

## 2020-08-20 DIAGNOSIS — J90 Pleural effusion, not elsewhere classified: Secondary | ICD-10-CM | POA: Diagnosis not present

## 2020-08-20 DIAGNOSIS — R0601 Orthopnea: Secondary | ICD-10-CM | POA: Diagnosis not present

## 2020-08-20 DIAGNOSIS — E782 Mixed hyperlipidemia: Secondary | ICD-10-CM | POA: Diagnosis not present

## 2020-08-20 DIAGNOSIS — R0602 Shortness of breath: Secondary | ICD-10-CM

## 2020-08-20 IMAGING — CR DG CHEST 2V
2 series · 2 of 2 positions shown · non-contrast
Comparison: Prior chest x-ray [DATE]

CLINICAL DATA: Shortness of breath

EXAM:
CHEST - 2 VIEW

[w chest pa]
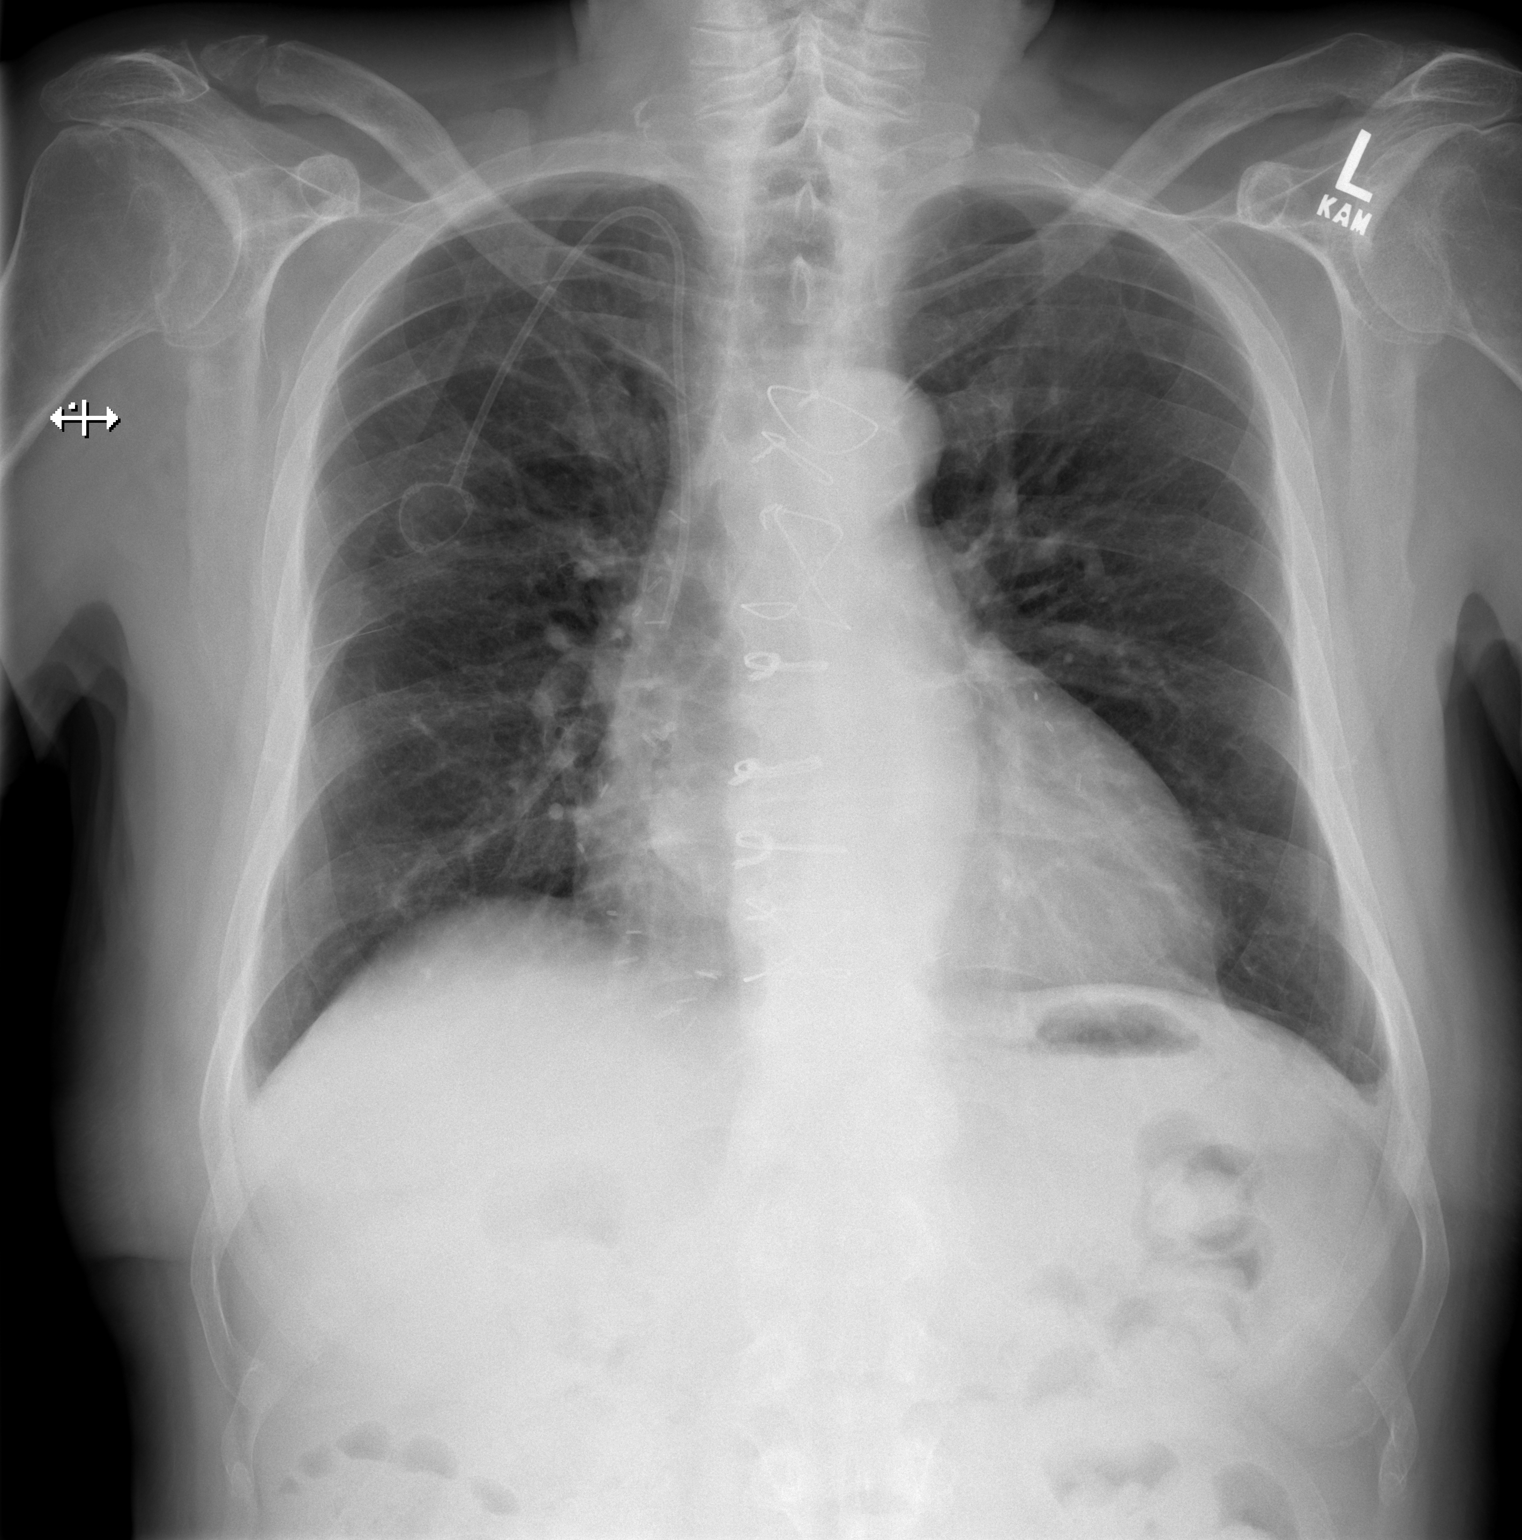

[w chest lat]
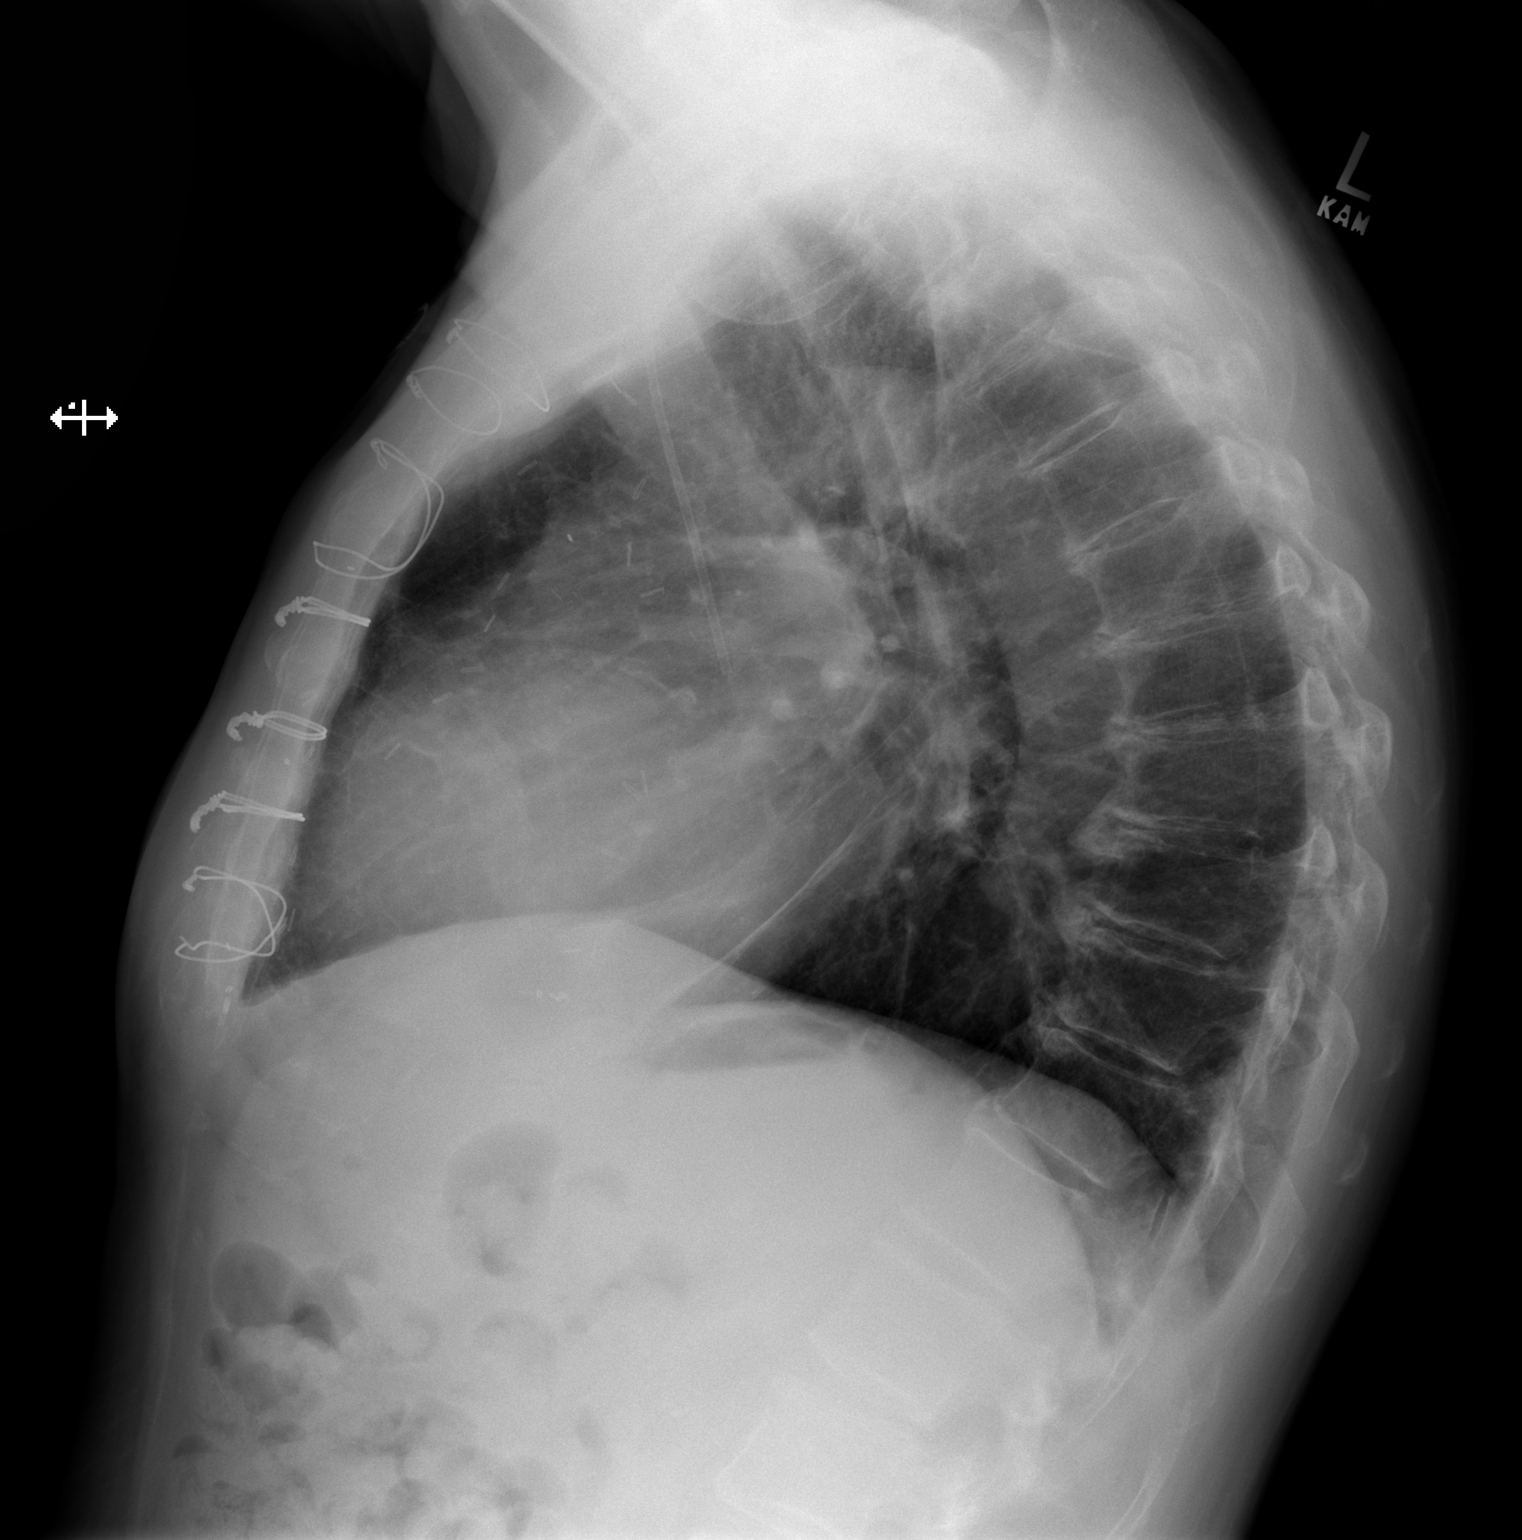

[2 of 2 positions shown; findings below may reference images not displayed]

FINDINGS: Right IJ single-lumen power injectable port catheter. Catheter tip
projects over the mid SVC. Patient is status post median sternotomy
with evidence of prior multivessel CABG. Cardiac and mediastinal
contours are unchanged. Trace bilateral pleural effusions, improved
compared to prior. No evidence of pulmonary edema, focal airspace
infiltrate or pneumothorax. No acute osseous abnormality.
IMPRESSION: 1. Trace residual bilateral pleural effusions, significantly
improved compared to prior.
2. Right IJ single-lumen power injectable port catheter with the
catheter tip overlying the mid SVC.
3. No acute cardiopulmonary process.

## 2020-08-20 NOTE — Patient Instructions (Signed)
Medication Instructions:  The current medical regimen is effective;  continue present plan and medications.  *If you need a refill on your cardiac medications before your next appointment, please call your pharmacy*   Testing/Procedures: Chest xray - Your physician has requested that you have a chest xray, is a fast and painless imaging test that uses certain electromagnetic waves to create pictures of the structures in and around your chest. This test can help diagnose and monitor conditions such as pneumonia and other lung issues his will be done at [facility], [place]. If you should need to call them their phone number is [phone].   Follow-Up: At [facility], you and your health needs are our priority.  As part of our continuing mission to provide you with exceptional heart care, we have created designated Provider Care Teams.  These Care Teams include your primary Cardiologist (physician) and Advanced Practice Providers (APPs -  Physician Assistants and Nurse Practitioners) who all work together to provide you with the care you need, when you need it.  We recommend signing up for the patient portal called "MyChart".  Sign up information is provided on this After Visit Summary.  MyChart is used to connect with patients for Virtual Visits (Telemedicine).  Patients are able to view lab/test results, encounter notes, upcoming appointments, etc.  Non-urgent messages can be sent to your provider as well.   To learn more about what you can do with MyChart, go to [email or web address].    Your next appointment:   Keep appointment in October.

## 2020-08-21 ENCOUNTER — Other Ambulatory Visit: Payer: Self-pay

## 2020-08-21 DIAGNOSIS — C8330 Diffuse large B-cell lymphoma, unspecified site: Secondary | ICD-10-CM

## 2020-08-24 ENCOUNTER — Inpatient Hospital Stay: Payer: Medicare HMO | Attending: Hematology

## 2020-08-24 ENCOUNTER — Other Ambulatory Visit: Payer: Self-pay

## 2020-08-24 ENCOUNTER — Inpatient Hospital Stay: Payer: Medicare HMO

## 2020-08-24 VITALS — BP 94/60 | HR 84 | Temp 97.6°F | Resp 18 | Wt 195.0 lb

## 2020-08-24 DIAGNOSIS — Z79899 Other long term (current) drug therapy: Secondary | ICD-10-CM | POA: Insufficient documentation

## 2020-08-24 DIAGNOSIS — Z5111 Encounter for antineoplastic chemotherapy: Secondary | ICD-10-CM | POA: Insufficient documentation

## 2020-08-24 DIAGNOSIS — Z95828 Presence of other vascular implants and grafts: Secondary | ICD-10-CM

## 2020-08-24 DIAGNOSIS — C8330 Diffuse large B-cell lymphoma, unspecified site: Secondary | ICD-10-CM

## 2020-08-24 DIAGNOSIS — Z5189 Encounter for other specified aftercare: Secondary | ICD-10-CM | POA: Insufficient documentation

## 2020-08-24 DIAGNOSIS — C8335 Diffuse large B-cell lymphoma, lymph nodes of inguinal region and lower limb: Secondary | ICD-10-CM | POA: Insufficient documentation

## 2020-08-24 DIAGNOSIS — Z5112 Encounter for antineoplastic immunotherapy: Secondary | ICD-10-CM | POA: Diagnosis not present

## 2020-08-24 DIAGNOSIS — Z7189 Other specified counseling: Secondary | ICD-10-CM

## 2020-08-24 LAB — CBC WITH DIFFERENTIAL (CANCER CENTER ONLY)
Abs Immature Granulocytes: 0.18 10*3/uL — ABNORMAL HIGH (ref 0.00–0.07)
Basophils Absolute: 0.1 10*3/uL (ref 0.0–0.1)
Basophils Relative: 0 %
Eosinophils Absolute: 0 10*3/uL (ref 0.0–0.5)
Eosinophils Relative: 0 %
HCT: 31.3 % — ABNORMAL LOW (ref 39.0–52.0)
Hemoglobin: 9.8 g/dL — ABNORMAL LOW (ref 13.0–17.0)
Immature Granulocytes: 1 %
Lymphocytes Relative: 2 %
Lymphs Abs: 0.6 10*3/uL — ABNORMAL LOW (ref 0.7–4.0)
MCH: 27.1 pg (ref 26.0–34.0)
MCHC: 31.3 g/dL (ref 30.0–36.0)
MCV: 86.5 fL (ref 80.0–100.0)
Monocytes Absolute: 0.4 10*3/uL (ref 0.1–1.0)
Monocytes Relative: 2 %
Neutro Abs: 22.4 10*3/uL — ABNORMAL HIGH (ref 1.7–7.7)
Neutrophils Relative %: 95 %
Platelet Count: 241 10*3/uL (ref 150–400)
RBC: 3.62 MIL/uL — ABNORMAL LOW (ref 4.22–5.81)
RDW: 16.9 % — ABNORMAL HIGH (ref 11.5–15.5)
WBC Count: 23.7 10*3/uL — ABNORMAL HIGH (ref 4.0–10.5)
nRBC: 0 % (ref 0.0–0.2)

## 2020-08-24 LAB — CMP (CANCER CENTER ONLY)
ALT: 18 U/L (ref 0–44)
AST: 17 U/L (ref 15–41)
Albumin: 3.4 g/dL — ABNORMAL LOW (ref 3.5–5.0)
Alkaline Phosphatase: 84 U/L (ref 38–126)
Anion gap: 13 (ref 5–15)
BUN: 16 mg/dL (ref 8–23)
CO2: 24 mmol/L (ref 22–32)
Calcium: 7.3 mg/dL — ABNORMAL LOW (ref 8.9–10.3)
Chloride: 107 mmol/L (ref 98–111)
Creatinine: 1.4 mg/dL — ABNORMAL HIGH (ref 0.61–1.24)
GFR, Estimated: 52 mL/min — ABNORMAL LOW (ref >60)
Glucose, Bld: 238 mg/dL — ABNORMAL HIGH (ref 70–99)
Potassium: 3.6 mmol/L (ref 3.5–5.1)
Sodium: 144 mmol/L (ref 135–145)
Total Bilirubin: 0.7 mg/dL (ref 0.3–1.2)
Total Protein: 6.6 g/dL (ref 6.5–8.1)

## 2020-08-24 MED ORDER — VINCRISTINE SULFATE CHEMO INJECTION 1 MG/ML
1.0000 mg | Freq: Once | INTRAVENOUS | Status: AC
  Administered 2020-08-24: 1 mg via INTRAVENOUS
  Filled 2020-08-24: qty 1

## 2020-08-24 MED ORDER — DIPHENHYDRAMINE HCL 25 MG PO CAPS
ORAL_CAPSULE | ORAL | Status: AC
  Filled 2020-08-24: qty 2

## 2020-08-24 MED ORDER — PALONOSETRON HCL INJECTION 0.25 MG/5ML
INTRAVENOUS | Status: AC
  Filled 2020-08-24: qty 5

## 2020-08-24 MED ORDER — SODIUM CHLORIDE 0.9 % IV SOLN
35.0000 mg/m2 | Freq: Once | INTRAVENOUS | Status: AC
  Administered 2020-08-24: 80 mg via INTRAVENOUS
  Filled 2020-08-24: qty 4

## 2020-08-24 MED ORDER — ACETAMINOPHEN 325 MG PO TABS
ORAL_TABLET | ORAL | Status: AC
  Filled 2020-08-24: qty 2

## 2020-08-24 MED ORDER — SODIUM CHLORIDE 0.9% FLUSH
10.0000 mL | Freq: Once | INTRAVENOUS | Status: AC
Start: 2020-08-24 — End: 2020-08-24
  Administered 2020-08-24: 10 mL
  Filled 2020-08-24: qty 10

## 2020-08-24 MED ORDER — SODIUM CHLORIDE 0.9% FLUSH
10.0000 mL | INTRAVENOUS | Status: DC | PRN
  Administered 2020-08-24: 10 mL
  Filled 2020-08-24: qty 10

## 2020-08-24 MED ORDER — PALONOSETRON HCL INJECTION 0.25 MG/5ML
0.2500 mg | Freq: Once | INTRAVENOUS | Status: AC
  Administered 2020-08-24: 0.25 mg via INTRAVENOUS

## 2020-08-24 MED ORDER — SODIUM CHLORIDE 0.9 % IV SOLN
10.0000 mg | Freq: Once | INTRAVENOUS | Status: AC
  Administered 2020-08-24: 10 mg via INTRAVENOUS
  Filled 2020-08-24: qty 10

## 2020-08-24 MED ORDER — DIPHENHYDRAMINE HCL 25 MG PO CAPS
50.0000 mg | ORAL_CAPSULE | Freq: Once | ORAL | Status: AC
  Administered 2020-08-24: 50 mg via ORAL

## 2020-08-24 MED ORDER — SODIUM CHLORIDE 0.9 % IV SOLN
375.0000 mg/m2 | Freq: Once | INTRAVENOUS | Status: AC
  Administered 2020-08-24: 800 mg via INTRAVENOUS
  Filled 2020-08-24: qty 50

## 2020-08-24 MED ORDER — SODIUM CHLORIDE 0.9 % IV SOLN
Freq: Once | INTRAVENOUS | Status: AC
  Filled 2020-08-24: qty 250

## 2020-08-24 MED ORDER — HEPARIN SOD (PORK) LOCK FLUSH 100 UNIT/ML IV SOLN
500.0000 [IU] | Freq: Once | INTRAVENOUS | Status: AC | PRN
  Administered 2020-08-24: 500 [IU]
  Filled 2020-08-24: qty 5

## 2020-08-24 MED ORDER — ACETAMINOPHEN 325 MG PO TABS
650.0000 mg | ORAL_TABLET | Freq: Once | ORAL | Status: AC
  Administered 2020-08-24: 650 mg via ORAL

## 2020-08-24 MED ORDER — SODIUM CHLORIDE 0.9 % IV SOLN
400.0000 mg/m2 | Freq: Once | INTRAVENOUS | Status: AC
  Administered 2020-08-24: 860 mg via INTRAVENOUS
  Filled 2020-08-24: qty 43

## 2020-08-24 NOTE — Progress Notes (Signed)
Pt stated he took his Prednisone.

## 2020-08-24 NOTE — Patient Instructions (Signed)
[facility] ONCOLOGY  Discharge Instructions: Thank you for choosing [facility] to provide your oncology and hematology care.   If you have a lab appointment with the [facility], please go directly to the [facility] and check in at the registration area.   Wear comfortable clothing and clothing appropriate for easy access to any Portacath or PICC line.   We strive to give you quality time with your provider. You may need to reschedule your appointment if you arrive late (15 or more minutes).  Arriving late affects you and other patients whose appointments are after yours.  Also, if you miss three or more appointments without notifying the office, you may be dismissed from the clinic at the provider's discretion.      For prescription refill requests, have your pharmacy contact our office and allow 72 hours for refills to be completed.    Today you received the following chemotherapy and/or immunotherapy agents Rituximab-pvvr, Vincristine, Cyclophosphamide, Etoposide.      To help prevent nausea and vomiting after your treatment, we encourage you to take your nausea medication as directed.  BELOW ARE SYMPTOMS THAT SHOULD BE REPORTED IMMEDIATELY: *FEVER GREATER THAN 100.4 F (38 C) OR HIGHER *CHILLS OR SWEATING *NAUSEA AND VOMITING THAT IS NOT CONTROLLED WITH YOUR NAUSEA MEDICATION *UNUSUAL SHORTNESS OF BREATH *UNUSUAL BRUISING OR BLEEDING *URINARY PROBLEMS (pain or burning when urinating, or frequent urination) *BOWEL PROBLEMS (unusual diarrhea, constipation, pain near the anus) TENDERNESS IN MOUTH AND THROAT WITH OR WITHOUT PRESENCE OF ULCERS (sore throat, sores in mouth, or a toothache) UNUSUAL RASH, SWELLING OR PAIN  UNUSUAL VAGINAL DISCHARGE OR ITCHING   Items with * indicate a potential emergency and should be followed up as soon as possible or go to the Emergency Department if any problems should occur.  Please show the CHEMOTHERAPY ALERT  CARD or IMMUNOTHERAPY ALERT CARD at check-in to the Emergency Department and triage nurse.  Should you have questions after your visit or need to cancel or reschedule your appointment, please contact [facility]  Dept: [phone]  and follow the prompts.  Office hours are 8:00 a.m. to 4:30 p.m. Monday - Friday. Please note that voicemails left after 4:00 p.m. may not be returned until the following business day.  We are closed weekends and major holidays. You have access to a nurse at all times for urgent questions. Please call the main number to the clinic Dept: [phone] and follow the prompts.   For any non-urgent questions, you may also contact your provider using MyChart. We now offer e-Visits for anyone 77 and older to request care online for non-urgent symptoms. For details visit [email or web address].   Also download the MyChart app! Go to the app store, search "MyChart", open the app, select [facility], and log in with your MyChart username and password.  Due to Covid, a mask is required upon entering the hospital/clinic. If you do not have a mask, one will be given to you upon arrival. For doctor visits, patients may have 1 support person aged 40 or older with them. For treatment visits, patients cannot have anyone with them due to current Covid guidelines and our immunocompromised population.

## 2020-08-24 NOTE — Progress Notes (Signed)
Pt requested to have PortaCath deaccessed after each infusion d/t pt stated he's afraid he might pull it out while sleeping at night.

## 2020-08-25 ENCOUNTER — Encounter: Payer: Self-pay | Admitting: Hematology

## 2020-08-25 ENCOUNTER — Inpatient Hospital Stay: Payer: Medicare HMO

## 2020-08-25 VITALS — BP 113/69 | HR 97 | Temp 97.9°F | Resp 17

## 2020-08-25 DIAGNOSIS — Z79899 Other long term (current) drug therapy: Secondary | ICD-10-CM | POA: Diagnosis not present

## 2020-08-25 DIAGNOSIS — C8335 Diffuse large B-cell lymphoma, lymph nodes of inguinal region and lower limb: Secondary | ICD-10-CM | POA: Diagnosis not present

## 2020-08-25 DIAGNOSIS — Z5111 Encounter for antineoplastic chemotherapy: Secondary | ICD-10-CM | POA: Diagnosis not present

## 2020-08-25 DIAGNOSIS — Z5189 Encounter for other specified aftercare: Secondary | ICD-10-CM | POA: Diagnosis not present

## 2020-08-25 DIAGNOSIS — Z7189 Other specified counseling: Secondary | ICD-10-CM

## 2020-08-25 DIAGNOSIS — C8330 Diffuse large B-cell lymphoma, unspecified site: Secondary | ICD-10-CM

## 2020-08-25 DIAGNOSIS — Z5112 Encounter for antineoplastic immunotherapy: Secondary | ICD-10-CM | POA: Diagnosis not present

## 2020-08-25 MED ORDER — PROCHLORPERAZINE MALEATE 10 MG PO TABS
10.0000 mg | ORAL_TABLET | Freq: Once | ORAL | Status: AC
  Administered 2020-08-25: 10 mg via ORAL

## 2020-08-25 MED ORDER — HEPARIN SOD (PORK) LOCK FLUSH 100 UNIT/ML IV SOLN
500.0000 [IU] | Freq: Once | INTRAVENOUS | Status: AC | PRN
  Administered 2020-08-25: 500 [IU]
  Filled 2020-08-25: qty 5

## 2020-08-25 MED ORDER — PROCHLORPERAZINE MALEATE 10 MG PO TABS
ORAL_TABLET | ORAL | Status: AC
  Filled 2020-08-25: qty 1

## 2020-08-25 MED ORDER — SODIUM CHLORIDE 0.9 % IV SOLN
35.0000 mg/m2 | Freq: Once | INTRAVENOUS | Status: AC
  Administered 2020-08-25: 80 mg via INTRAVENOUS
  Filled 2020-08-25: qty 4

## 2020-08-25 MED ORDER — SODIUM CHLORIDE 0.9 % IV SOLN
Freq: Once | INTRAVENOUS | Status: AC
  Filled 2020-08-25: qty 250

## 2020-08-25 MED ORDER — SODIUM CHLORIDE 0.9% FLUSH
10.0000 mL | INTRAVENOUS | Status: DC | PRN
  Administered 2020-08-25: 10 mL
  Filled 2020-08-25: qty 10

## 2020-08-25 NOTE — Patient Instructions (Signed)
[facility]  Discharge Instructions: ?Thank you for choosing [facility] to provide your oncology and hematology care.  ? ?If you have a lab appointment with the Cancer Center, please go directly to the Cancer Center and check in at the registration area. ?  ?Wear comfortable clothing and clothing appropriate for easy access to any Portacath or PICC line.  ? ?We strive to give you quality time with your provider. You may need to reschedule your appointment if you arrive late (15 or more minutes).  Arriving late affects you and other patients whose appointments are after yours.  Also, if you miss three or more appointments without notifying the office, you may be dismissed from the clinic at the provider?s discretion.    ?  ?For prescription refill requests, have your pharmacy contact our office and allow 72 hours for refills to be completed.   ? ?Today you received the following chemotherapy and/or immunotherapy agents: Etoposide.     ?  ?To help prevent nausea and vomiting after your treatment, we encourage you to take your nausea medication as directed. ? ?BELOW ARE SYMPTOMS THAT SHOULD BE REPORTED IMMEDIATELY: ?*FEVER GREATER THAN 100.4 F (38 ?C) OR HIGHER ?*CHILLS OR SWEATING ?*NAUSEA AND VOMITING THAT IS NOT CONTROLLED WITH YOUR NAUSEA MEDICATION ?*UNUSUAL SHORTNESS OF BREATH ?*UNUSUAL BRUISING OR BLEEDING ?*URINARY PROBLEMS (pain or burning when urinating, or frequent urination) ?*BOWEL PROBLEMS (unusual diarrhea, constipation, pain near the anus) ?TENDERNESS IN MOUTH AND THROAT WITH OR WITHOUT PRESENCE OF ULCERS (sore throat, sores in mouth, or a toothache) ?UNUSUAL RASH, SWELLING OR PAIN  ?UNUSUAL VAGINAL DISCHARGE OR ITCHING  ? ?Items with * indicate a potential emergency and should be followed up as soon as possible or go to the Emergency Department if any problems should occur. ? ?Please show the CHEMOTHERAPY ALERT CARD or IMMUNOTHERAPY ALERT CARD at check-in  to the Emergency Department and triage nurse. ? ?Should you have questions after your visit or need to cancel or reschedule your appointment, please contact [facility]  Dept: [phone]  and follow the prompts.  Office hours are 8:00 a.m. to 4:30 p.m. Monday - Friday. Please note that voicemails left after 4:00 p.m. may not be returned until the following business day.  We are closed weekends and major holidays. You have access to a nurse at all times for urgent questions. Please call the main number to the clinic Dept: [phone] and follow the prompts. ? ? ?For any non-urgent questions, you may also contact your provider using MyChart. We now offer e-Visits for anyone 18 and older to request care online for non-urgent symptoms. For details visit [email or web address]. ?  ?Also download the MyChart app! Go to the app store, search "MyChart", open the app, select [facility], and log in with your MyChart username and password. ? ?Due to Covid, a mask is required upon entering the hospital/clinic. If you do not have a mask, one will be given to you upon arrival. For doctor visits, patients may have 1 support person aged 18 or older with them. For treatment visits, patients cannot have anyone with them due to current Covid guidelines and our immunocompromised population.  ? ?

## 2020-08-26 ENCOUNTER — Other Ambulatory Visit: Payer: Self-pay

## 2020-08-26 ENCOUNTER — Inpatient Hospital Stay: Payer: Medicare HMO

## 2020-08-26 VITALS — BP 111/77 | HR 98 | Temp 98.5°F | Resp 18

## 2020-08-26 DIAGNOSIS — C8335 Diffuse large B-cell lymphoma, lymph nodes of inguinal region and lower limb: Secondary | ICD-10-CM | POA: Diagnosis not present

## 2020-08-26 DIAGNOSIS — Z5112 Encounter for antineoplastic immunotherapy: Secondary | ICD-10-CM | POA: Diagnosis not present

## 2020-08-26 DIAGNOSIS — Z7189 Other specified counseling: Secondary | ICD-10-CM

## 2020-08-26 DIAGNOSIS — Z5111 Encounter for antineoplastic chemotherapy: Secondary | ICD-10-CM | POA: Diagnosis not present

## 2020-08-26 DIAGNOSIS — Z5189 Encounter for other specified aftercare: Secondary | ICD-10-CM | POA: Diagnosis not present

## 2020-08-26 DIAGNOSIS — C8330 Diffuse large B-cell lymphoma, unspecified site: Secondary | ICD-10-CM

## 2020-08-26 DIAGNOSIS — Z79899 Other long term (current) drug therapy: Secondary | ICD-10-CM | POA: Diagnosis not present

## 2020-08-26 MED ORDER — HEPARIN SOD (PORK) LOCK FLUSH 100 UNIT/ML IV SOLN
500.0000 [IU] | Freq: Once | INTRAVENOUS | Status: AC | PRN
  Administered 2020-08-26: 500 [IU]
  Filled 2020-08-26: qty 5

## 2020-08-26 MED ORDER — PROCHLORPERAZINE MALEATE 10 MG PO TABS
10.0000 mg | ORAL_TABLET | Freq: Once | ORAL | Status: AC
  Administered 2020-08-26: 10 mg via ORAL

## 2020-08-26 MED ORDER — PROCHLORPERAZINE MALEATE 10 MG PO TABS
ORAL_TABLET | ORAL | Status: AC
  Filled 2020-08-26: qty 1

## 2020-08-26 MED ORDER — SODIUM CHLORIDE 0.9% FLUSH
10.0000 mL | INTRAVENOUS | Status: DC | PRN
  Administered 2020-08-26: 10 mL
  Filled 2020-08-26: qty 10

## 2020-08-26 MED ORDER — SODIUM CHLORIDE 0.9 % IV SOLN
Freq: Once | INTRAVENOUS | Status: AC
  Filled 2020-08-26: qty 250

## 2020-08-26 MED ORDER — SODIUM CHLORIDE 0.9 % IV SOLN
35.0000 mg/m2 | Freq: Once | INTRAVENOUS | Status: AC
  Administered 2020-08-26: 80 mg via INTRAVENOUS
  Filled 2020-08-26: qty 4

## 2020-08-26 NOTE — Patient Instructions (Signed)
[facility]  Discharge Instructions: ?Thank you for choosing [facility] to provide your oncology and hematology care.  ? ?If you have a lab appointment with the Cancer Center, please go directly to the Cancer Center and check in at the registration area. ?  ?Wear comfortable clothing and clothing appropriate for easy access to any Portacath or PICC line.  ? ?We strive to give you quality time with your provider. You may need to reschedule your appointment if you arrive late (15 or more minutes).  Arriving late affects you and other patients whose appointments are after yours.  Also, if you miss three or more appointments without notifying the office, you may be dismissed from the clinic at the provider?s discretion.    ?  ?For prescription refill requests, have your pharmacy contact our office and allow 72 hours for refills to be completed.   ? ?Today you received the following chemotherapy and/or immunotherapy agents: Etoposide.     ?  ?To help prevent nausea and vomiting after your treatment, we encourage you to take your nausea medication as directed. ? ?BELOW ARE SYMPTOMS THAT SHOULD BE REPORTED IMMEDIATELY: ?*FEVER GREATER THAN 100.4 F (38 ?C) OR HIGHER ?*CHILLS OR SWEATING ?*NAUSEA AND VOMITING THAT IS NOT CONTROLLED WITH YOUR NAUSEA MEDICATION ?*UNUSUAL SHORTNESS OF BREATH ?*UNUSUAL BRUISING OR BLEEDING ?*URINARY PROBLEMS (pain or burning when urinating, or frequent urination) ?*BOWEL PROBLEMS (unusual diarrhea, constipation, pain near the anus) ?TENDERNESS IN MOUTH AND THROAT WITH OR WITHOUT PRESENCE OF ULCERS (sore throat, sores in mouth, or a toothache) ?UNUSUAL RASH, SWELLING OR PAIN  ?UNUSUAL VAGINAL DISCHARGE OR ITCHING  ? ?Items with * indicate a potential emergency and should be followed up as soon as possible or go to the Emergency Department if any problems should occur. ? ?Please show the CHEMOTHERAPY ALERT CARD or IMMUNOTHERAPY ALERT CARD at check-in  to the Emergency Department and triage nurse. ? ?Should you have questions after your visit or need to cancel or reschedule your appointment, please contact [facility]  Dept: [phone]  and follow the prompts.  Office hours are 8:00 a.m. to 4:30 p.m. Monday - Friday. Please note that voicemails left after 4:00 p.m. may not be returned until the following business day.  We are closed weekends and major holidays. You have access to a nurse at all times for urgent questions. Please call the main number to the clinic Dept: [phone] and follow the prompts. ? ? ?For any non-urgent questions, you may also contact your provider using MyChart. We now offer e-Visits for anyone 77 and older to request care online for non-urgent symptoms. For details visit [email or web address]. ?  ?Also download the MyChart app! Go to the app store, search "MyChart", open the app, select , and log in with your MyChart username and password. ? ?Due to Covid, a mask is required upon entering the hospital/clinic. If you do not have a mask, one will be given to you upon arrival. For doctor visits, patients may have 1 support person aged 18 or older with them. For treatment visits, patients cannot have anyone with them due to current Covid guidelines and our immunocompromised population.  ? ?

## 2020-08-26 NOTE — Progress Notes (Signed)
Pt contacted and given appointment for spinal injection. Pt to [facility] admitting at 9:45 am on 09/03/20. NPO after 7 am on 09/03/20. Pt to stop ASA 3 days prior to procedure. Pt acknowledged dates and times and verbalized understanding of directions.

## 2020-08-28 ENCOUNTER — Inpatient Hospital Stay: Payer: Medicare HMO

## 2020-08-28 ENCOUNTER — Other Ambulatory Visit: Payer: Self-pay

## 2020-08-28 VITALS — BP 112/76 | HR 97 | Temp 98.0°F | Resp 18

## 2020-08-28 DIAGNOSIS — Z79899 Other long term (current) drug therapy: Secondary | ICD-10-CM | POA: Diagnosis not present

## 2020-08-28 DIAGNOSIS — Z7189 Other specified counseling: Secondary | ICD-10-CM

## 2020-08-28 DIAGNOSIS — C8330 Diffuse large B-cell lymphoma, unspecified site: Secondary | ICD-10-CM

## 2020-08-28 DIAGNOSIS — C8335 Diffuse large B-cell lymphoma, lymph nodes of inguinal region and lower limb: Secondary | ICD-10-CM | POA: Diagnosis not present

## 2020-08-28 DIAGNOSIS — Z5189 Encounter for other specified aftercare: Secondary | ICD-10-CM | POA: Diagnosis not present

## 2020-08-28 DIAGNOSIS — Z5112 Encounter for antineoplastic immunotherapy: Secondary | ICD-10-CM | POA: Diagnosis not present

## 2020-08-28 DIAGNOSIS — Z5111 Encounter for antineoplastic chemotherapy: Secondary | ICD-10-CM | POA: Diagnosis not present

## 2020-08-28 MED ORDER — PEGFILGRASTIM-CBQV 6 MG/0.6ML ~~LOC~~ SOSY
6.0000 mg | PREFILLED_SYRINGE | Freq: Once | SUBCUTANEOUS | Status: AC
  Administered 2020-08-28: 6 mg via SUBCUTANEOUS

## 2020-08-28 MED ORDER — PEGFILGRASTIM-CBQV 6 MG/0.6ML ~~LOC~~ SOSY
PREFILLED_SYRINGE | SUBCUTANEOUS | Status: AC
  Filled 2020-08-28: qty 0.6

## 2020-08-31 ENCOUNTER — Encounter: Payer: Medicare HMO | Attending: Cardiovascular Disease

## 2020-08-31 ENCOUNTER — Other Ambulatory Visit: Payer: Self-pay

## 2020-08-31 DIAGNOSIS — Z951 Presence of aortocoronary bypass graft: Secondary | ICD-10-CM | POA: Insufficient documentation

## 2020-08-31 NOTE — Progress Notes (Signed)
Daily Session Note  Patient Details  Name: MARSHEL GOLUBSKI MRN: [id number] Date of Birth: 1945/10/15 Referring Provider:   Flowsheet Row Cardiac Rehab from 05/04/2020 in [facility] Cardiac and Pulmonary Re[facility]  Referring Provider [name] MD       Encounter Date: 08/31/2020  Check In:  Session Check In - 08/31/20 1411       Check-In   Supervising physician immediately available to respond to emergencies See telemetry face sheet for immediately available ER MD    Location [facility]-Cardiac & Pulmonary Rehab    Staff Present Birdie Sons, MPA, RN;Laureen Owens Shark, BS, RRT, CPFT;Amanda Oletta Darter, BA, ACSM CEP, Exercise Physiologist    Virtual Visit No    Medication changes reported     No    Fall or balance concerns reported    No    Warm-up and Cool-down Performed on first and last piece of equipment    Resistance Training Performed Yes    VAD Patient? No    PAD/SET Patient? No      Pain Assessment   Currently in Pain? No/denies                Social History   Tobacco Use  Smoking Status Former   Types: Pipe   Quit date: 11/29/1976   Years since quitting: 43.7  Smokeless Tobacco Never    Goals Met:  Independence with exercise equipment Exercise tolerated well No report of cardiac concerns or symptoms Strength training completed today  Goals Unmet:  Not Applicable  Comments: Pt able to follow exercise prescription today without complaint.  Discussed weight gain of approx. 12 lbs since his last visit on 7/27. Pt stated he has been in the bed for about a week due to a reaction with chemo medication. Pt stated no difficulty breathing and was feeling well. Will continue to monitor for progression.    Dr. Emily Filbert is Medical Director for [facility].  Dr. Ottie Glazier is Medical Director for [facility] Pulmonary Rehabilitation.

## 2020-09-02 ENCOUNTER — Encounter: Payer: Self-pay | Admitting: *Deleted

## 2020-09-02 ENCOUNTER — Other Ambulatory Visit: Payer: Self-pay

## 2020-09-02 VITALS — Ht 72.0 in | Wt 205.0 lb

## 2020-09-02 DIAGNOSIS — Z951 Presence of aortocoronary bypass graft: Secondary | ICD-10-CM

## 2020-09-02 NOTE — Patient Instructions (Signed)
Discharge Patient Instructions  Patient Details  Name: Roy [name] MRN: [id number] Date of Birth: [birth year] Referring Provider:  Geralynn Rile, *   Number of Visits: 36  Reason for Discharge:  Patient reached a stable level of exercise. Patient independent in their exercise. Patient has met program and personal goals.  Smoking History:  Social History   Tobacco Use  Smoking Status Former   Types: Pipe   Quit date: 11/29/1976   Years since quitting: 43.7  Smokeless Tobacco Never    Diagnosis:  S/P CABG x 4  Initial Exercise Prescription:  Initial Exercise Prescription - 05/04/20 1200       Date of Initial Exercise RX and Referring Provider   Date 05/04/20    Referring Provider [name] MD      Treadmill   MPH 1.7    Grade 0.5    Minutes 15    METs 2.42      Recumbant Bike   Level 2    RPM 60    Watts 10    Minutes 15    METs 2.1      NuStep   Level 2    SPM 80    Minutes 15    METs 2.1      T5 Nustep   Level 1    SPM 80    Minutes 15    METs 2.1      Prescription Details   Frequency (times per week) 2    Duration Progress to 30 minutes of continuous aerobic without signs/symptoms of physical distress      Intensity   THRR 40-80% of Max Heartrate 116-134    Ratings of Perceived Exertion 11-13    Perceived Dyspnea 0-4      Progression   Progression Continue to progress workloads to maintain intensity without signs/symptoms of physical distress.      Resistance Training   Training Prescription Yes    Weight 3 lb    Reps 10-15             Discharge Exercise Prescription (Final Exercise Prescription Changes):  Exercise Prescription Changes - 08/25/20 1400       Response to Exercise   Blood Pressure (Admit) 108/62    Blood Pressure (Exit) 98/60    Heart Rate (Admit) 107 bpm    Heart Rate (Exercise) 108 bpm    Heart Rate (Exit) 109 bpm    Oxygen Saturation (Admit) 95 %    Oxygen Saturation (Exercise) 87 %    Oxygen  Saturation (Exit) 98 %    Rating of Perceived Exertion (Exercise) 11    Symptoms none    Duration Continue with 30 min of aerobic exercise without signs/symptoms of physical distress.    Intensity THRR unchanged      Progression   Progression Continue to progress workloads to maintain intensity without signs/symptoms of physical distress.    Average METs 2.37      Resistance Training   Training Prescription Yes    Weight 3 lb    Reps 10-15      Interval Training   Interval Training No      Treadmill   MPH 1.1    Minutes 15    METs 1.84      Recumbant Bike   Level 2    Minutes 15    METs 2.91      Home Exercise Plan   Plans to continue exercise at Home (comment)   walking, weights, staff  videos   Frequency Add 2 additional days to program exercise sessions.    Initial Home Exercises Provided 06/08/20             Functional Capacity:  6 Minute Walk     Row Name 05/04/20 1245 09/02/20 1441       6 Minute Walk   Phase Initial Discharge    Distance 1000 feet 1065 feet    Distance % Change -- 6.7 %    Distance Feet Change -- 65 ft    Walk Time 6 minutes 6 minutes    # of Rest Breaks 0 0    MPH 1.89 2.01    METS 2.19 2.27    RPE 11 11    Perceived Dyspnea  1 1    VO2 Peak 7.69 7.95    Symptoms No No    Resting HR 98 bpm 102 bpm    Resting BP 112/68 124/70    Resting Oxygen Saturation  99 % 98 %    Exercise Oxygen Saturation  during 6 min walk 99 % 95 %    Max Ex. HR 116 bpm 117 bpm    Max Ex. BP 122/66 118/68    2 Minute Post BP 110/68 --              Nutrition & Weight - Outcomes:  Pre Biometrics - 05/04/20 1236       Pre Biometrics   Height 6' (1.829 m)    Weight 203 lb 8 oz (92.3 kg)    BMI (Calculated) 27.59    Single Leg Stand 15.9 seconds             Post Biometrics - 09/02/20 1443        Post  Biometrics   Height 6' (1.829 m)    Weight 205 lb (93 kg)    BMI (Calculated) 27.8             Nutrition:  Nutrition  Therapy & Goals - 05/19/20 1002       Nutrition Therapy   Diet Heart healthy, low Na, Diabetes friendly    Protein (specify units) 95-110g (active cancer - not in treatment yet)    Fiber 30 grams    Whole Grain Foods 3 servings    Saturated Fats 12 max. grams    Fruits and Vegetables 8 servings/day    Sodium 1.5 grams      Personal Nutrition Goals   Nutrition Goal ST: Increase fruit/vegetable intake to 5 servings per day (eat a rainbow per week), limit processed meat to <2x/month, mix brown rice with white rice to see how he enjoys it. LT: meet nutritional needs going through treatment, make sure RD is part of care team    Comments He has active Cancer, but needs to talk with cardiologist regarding his heart status and cancer treatment. The oncologists say he needs to begin treatment soon. Encouraged to ensure RD is a part of his treatment team. He is honoring his hunger cues and eating until satisfied, but not uncomfortably full. He is no longer eating red meat every day. He eats mostly chicken, fish, shrimp, and pork chops. He likes to eat many green vegetables which he will steam and he enjoys fruit - 3 servings per day normally (2 fruits and a vegetable; canned fruit typically -in own juice). He uses mostly olive oil, if eating eggs will use bacon fat - eats processed meat like bacon 2x/month. Does not eat much bread, but  he enjoys potatoes. A1C: 6.7. He does not take his BG at home and does not have a monitor at home. B: fruit cocktail or oatmeal or griits with dried fruit or a pad of butter; this morning he had an english muffin (regular) with strawberry jelly, butter, and a salmon patty with coffee (2 splenda and half and half) and apple juice for his medications. S:2 graham cracker with PB and 2% milk or white bread and [place] sandwich D: pork chop with mac and cheese and fried apples and iced tea (sweet) tonight. He will occassionally have shot of liquor or wine. Drinks: water and  occassionally pepsi, coke, dr pepper, 7-up. He reports liking whole wheat bread, but does not like it every day. Discussed heart healthy eating, diabetes friendly eating, and eating during cancer treatment (discussed possible barriers and increased needs).      Intervention Plan   Intervention Prescribe, educate and counsel regarding individualized specific dietary modifications aiming towards targeted core components such as weight, hypertension, lipid management, diabetes, heart failure and other comorbidities.;Nutrition handout(s) given to patient.    Expected Outcomes Short Term Goal: Understand basic principles of dietary content, such as calories, fat, sodium, cholesterol and nutrients.;Short Term Goal: A plan has been developed with personal nutrition goals set during dietitian appointment.;Long Term Goal: Adherence to prescribed nutrition plan.             Goals reviewed with patient; copy given to patient.

## 2020-09-02 NOTE — Progress Notes (Signed)
Cardiac Individual Treatment Plan  Patient Details  Name: Roy [name] MRN: [id number] Date of Birth: 08/31/1945 Referring Provider:   Flowsheet Row Cardiac Rehab from 05/04/2020 in [facility] Cardiac and Pulmonary Rehab  Referring Provider [name] MD       Initial Encounter Date:  Flowsheet Row Cardiac Rehab from 05/04/2020 in [facility] Cardiac and Pulmonary Rehab  Date 05/04/20       Visit Diagnosis: S/P CABG x 4  Patient's Home Medications on Admission:  Current Outpatient Medications:    acetaminophen (TYLENOL) 325 MG tablet, Take 2 tablets (650 mg total) by mouth every 4 (four) hours as needed for headache or mild pain., Disp: , Rfl:    Apoaequorin (PREVAGEN PO), Take 1 tablet by mouth at bedtime., Disp: , Rfl:    aspirin EC 81 MG tablet, Take 81 mg by mouth daily. Swallow whole., Disp: , Rfl:    atorvastatin (LIPITOR) 40 MG tablet, Take 1 tablet (40 mg total) by mouth daily., Disp: 90 tablet, Rfl: 3   cholecalciferol (VITAMIN D) 1000 UNITS tablet, Take 1,000 Units by mouth See admin instructions. Mon-friday, Disp: , Rfl:    empagliflozin (JARDIANCE) 10 MG TABS tablet, Take 1 tablet (10 mg total) by mouth daily before breakfast., Disp: 90 tablet, Rfl: 1   furosemide (LASIX) 80 MG tablet, Take 0.5 tablets (40 mg total) by mouth daily., Disp: 30 tablet, Rfl: 11   glucosamine-chondroitin 500-400 MG tablet, Take 1 tablet by mouth. 5 times weekly, Disp: , Rfl:    LORazepam (ATIVAN) 0.5 MG tablet, Take 1 tablet (0.5 mg total) by mouth every 6 (six) hours as needed for anxiety. Put 1 pill under your tongue every 6 hrs, if needed, for nausea, Disp: 30 tablet, Rfl: 0   metFORMIN (GLUCOPHAGE) 1000 MG tablet, Take 1,000 mg by mouth 2 (two) times daily with a meal. , Disp: , Rfl:    metoprolol (TOPROL XL) 200 MG 24 hr tablet, Take 1 tablet (200 mg total) by mouth daily., Disp: 90 tablet, Rfl: 3   Multiple Vitamin (MULTIVITAMIN WITH MINERALS) TABS tablet, Take 1 tablet by mouth. 5 times weekly,  Disp: , Rfl:    omeprazole (PRILOSEC) 20 MG capsule, Take 20 mg by mouth daily., Disp: , Rfl:    ondansetron (ZOFRAN) 8 MG tablet, Take 1 tablet (8 mg total) by mouth every 8 (eight) hours as needed for nausea or vomiting. Take 1 pill every 8 hrs for 3 days., Disp: 30 tablet, Rfl: 3   prochlorperazine (COMPAZINE) 10 MG tablet, Take 1 tablet (10 mg total) by mouth every 8 (eight) hours as needed for nausea or vomiting., Disp: 40 tablet, Rfl: 2   sacubitril-valsartan (ENTRESTO) 24-26 MG, Take 1 tablet by mouth 2 (two) times daily., Disp: 60 tablet, Rfl: 3   spironolactone (ALDACTONE) 25 MG tablet, Take 0.5 tablets (12.5 mg total) by mouth daily., Disp: 60 tablet, Rfl: 1   tamsulosin (FLOMAX) 0.4 MG CAPS capsule, Take 0.4 mg by mouth at bedtime., Disp: , Rfl:    vitamin B-12 (CYANOCOBALAMIN) 1000 MCG tablet, Take 1,000 mcg by mouth. 5 times a week, Disp: , Rfl:   Current Facility-Administered Medications:    potassium chloride (KLOR-CON) CR tablet 20 mEq, 20 mEq, Oral, BID, [name], Glenice Bow, MD  Past Medical History: Past Medical History:  Diagnosis Date   Allergic rhinitis    Arthritis    wrists   Cardiomyopathy, nonischemic [facility])    followed by cardiology--- dr [name]---  2016 ef 45-50% ,  2016  nuclear ef 37%,  2017 per echo ef 50-55%   CHF (congestive heart failure), NYHA class III ([place]) 03/21/2020   Coronary artery disease    GERD (gastroesophageal reflux disease)    Hiatal hernia    History of kidney stones    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;   01/ 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   History of syncope (03-06-2020 pt stated has not had sycopal episode in few years, stated it seems to happen in extreme hot conditions)   cardiologist--- dr [name]--- dx recurrent syncope;  nuclear study 11-03-2014 intermediate risk w/ no ischemia, apical hypokinesis, nuclear ef 37%;  event monitor-- 12-21-2015 SB/ ST  no pauses/ arrythmia's;  echo 08-03-2015  ef 50-55%   Hyperlipidemia    Hypertension    followed by pcp   Hypovitaminosis D    Mass of left testicle    Nocturia    Plantar fasciitis    Presence of surgical incision    01/ 2022  moh's w/ skin graft of nose, per pt still healing and wear bandage daily   Type 2 diabetes mellitus ([place])    pt is adament that he is not and have been told he is a diabetic but a borderline;  followed by pcp, in pcp note states DM2 and takes 2 meds daily   Wears glasses    Wears hearing aid in both ears     Tobacco Use: Social History   Tobacco Use  Smoking Status Former   Types: Pipe   Quit date: 11/29/1976   Years since quitting: 43.7  Smokeless Tobacco Never    Labs: Recent Review Flowsheet Data     Labs for ITP Cardiac and Pulmonary Rehab Latest Ref Rng & Units 03/28/2020 03/29/2020 03/30/2020 03/30/2020 07/03/2020   Cholestrol 100 - 199 mg/dL - - - - 128   LDLCALC 0 - 99 mg/dL - - - - 65   HDL >39 mg/dL - - - - 41   Trlycerides 0 - 149 mg/dL - - - - 124   Hemoglobin A1c 4.8 - 5.6 % - - - - -   PHART 7.350 - 7.450 - - - - -   PCO2ART 32.0 - 48.0 mmHg - - - - -   HCO3 20.0 - 28.0 mmol/L - - - - -   TCO2 22 - 32 mmol/L - - - - -   ACIDBASEDEF 0.0 - 2.0 mmol/L - - - - -   O2SAT % 61.4 60.3 59.8 56.5 -        Exercise Target Goals: Exercise Program Goal: Individual exercise prescription set using results from initial 6 min walk test and THRR while considering  patient's activity barriers and safety.   Exercise Prescription Goal: Initial exercise prescription builds to 30-45 minutes a day of aerobic activity, 2-3 days per week.  Home exercise guidelines will be given to patient during program as part of exercise prescription that the participant will acknowledge.   Education: Aerobic Exercise: - Group verbal and visual presentation on the components of exercise prescription. Introduces F.I.T.T principle from ACSM for exercise prescriptions.  Reviews F.I.T.T. principles of aerobic exercise  including progression. Written material given at graduation.   Education: Resistance Exercise: - Group verbal and visual presentation on the components of exercise prescription. Introduces F.I.T.T principle from ACSM for exercise prescriptions  Reviews F.I.T.T. principles of resistance exercise including progression. Written material given at graduation. Flowsheet Row  Cardiac Rehab from 08/19/2020 in [facility] Cardiac and Pulmonary Rehab  Date 07/22/20  Educator [place]  Instruction Review Code 1- Verbalizes Understanding        Education: Exercise & Equipment Safety: - Individual verbal instruction and demonstration of equipment use and safety with use of the equipment. Flowsheet Row Cardiac Rehab from 08/19/2020 in [facility] Cardiac and Pulmonary Rehab  Date 04/30/20  Educator [facility]  Instruction Review Code 1- Verbalizes Understanding       Education: Exercise Physiology & General Exercise Guidelines: - Group verbal and written instruction with models to review the exercise physiology of the cardiovascular system and associated critical values. Provides general exercise guidelines with specific guidelines to those with heart or lung disease.  Flowsheet Row Cardiac Rehab from 08/19/2020 in [facility] Cardiac and Pulmonary Rehab  Date 07/08/20  Educator [place]  Instruction Review Code 1- Verbalizes Understanding       Education: Flexibility, Balance, Mind/Body Relaxation: - Group verbal and visual presentation with interactive activity on the components of exercise prescription. Introduces F.I.T.T principle from ACSM for exercise prescriptions. Reviews F.I.T.T. principles of flexibility and balance exercise training including progression. Also discusses the mind body connection.  Reviews various relaxation techniques to help reduce and manage stress (i.e. Deep breathing, progressive muscle relaxation, and visualization). Balance handout provided to take home. Written material given at graduation. Flowsheet Row  Cardiac Rehab from 08/19/2020 in [facility] Cardiac and Pulmonary Rehab  Date 07/29/20  Educator [facility]  Instruction Review Code 1- Verbalizes Understanding       Activity Barriers & Risk Stratification:  Activity Barriers & Cardiac Risk Stratification - 05/04/20 1237       Activity Barriers & Cardiac Risk Stratification   Activity Barriers Shortness of Breath;Decreased Ventricular Function;Deconditioning;Muscular Weakness;Other (comment)    Comments Right shoulder- limited ROM due to rotator cuff surgery and bicep tear    Cardiac Risk Stratification High             6 Minute Walk:  6 Minute Walk     Row Name 05/04/20 1245         6 Minute Walk   Phase Initial     Distance 1000 feet     Walk Time 6 minutes     # of Rest Breaks 0     MPH 1.89     METS 2.19     RPE 11     Perceived Dyspnea  1     VO2 Peak 7.69     Symptoms No     Resting HR 98 bpm     Resting BP 112/68     Resting Oxygen Saturation  99 %     Exercise Oxygen Saturation  during 6 min walk 99 %     Max Ex. HR 116 bpm     Max Ex. BP 122/66     2 Minute Post BP 110/68              Oxygen Initial Assessment:   Oxygen Re-Evaluation:   Oxygen Discharge (Final Oxygen Re-Evaluation):   Initial Exercise Prescription:  Initial Exercise Prescription - 05/04/20 1200       Date of Initial Exercise RX and Referring Provider   Date 05/04/20    Referring Provider [name] MD      Treadmill   MPH 1.7    Grade 0.5    Minutes 15    METs 2.42      Recumbant Bike   Level 2    RPM 60  Watts 10    Minutes 15    METs 2.1      NuStep   Level 2    SPM 80    Minutes 15    METs 2.1      T5 Nustep   Level 1    SPM 80    Minutes 15    METs 2.1      Prescription Details   Frequency (times per week) 2    Duration Progress to 30 minutes of continuous aerobic without signs/symptoms of physical distress      Intensity   THRR 40-80% of Max Heartrate 116-134    Ratings of Perceived Exertion  11-13    Perceived Dyspnea 0-4      Progression   Progression Continue to progress workloads to maintain intensity without signs/symptoms of physical distress.      Resistance Training   Training Prescription Yes    Weight 3 lb    Reps 10-15             Perform Capillary Blood Glucose checks as needed.  Exercise Prescription Changes:   Exercise Prescription Changes     Row Name 05/04/20 1200 05/20/20 1500 06/02/20 1300 06/08/20 1400 06/17/20 0800     Response to Exercise   Blood Pressure (Admit) 112/68 116/64 104/64 -- 94/60   Blood Pressure (Exercise) 122/66 124/60 142/70 -- 110/64   Blood Pressure (Exit) 110/68 104/62 90/62 -- 98/60   Heart Rate (Admit) 98 bpm 74 bpm 100 bpm -- 93 bpm   Heart Rate (Exercise) 116 bpm 111 bpm 112 bpm -- 109 bpm   Heart Rate (Exit) 99 bpm 100 bpm 104 bpm -- 100 bpm   Oxygen Saturation (Admit) 99 % -- -- -- --   Oxygen Saturation (Exercise) 99 % -- -- -- --   Oxygen Saturation (Exit) 99 % -- -- -- --   Rating of Perceived Exertion (Exercise) _0 -- 11   Perceived Dyspnea (Exercise) 1 -- -- -- --   Symptoms none none -- -- none   Comments walk test results third full day of exercise -- -- --   Duration -- Continue with 30 min of aerobic exercise without signs/symptoms of physical distress. Continue with 30 min of aerobic exercise without signs/symptoms of physical distress. -- Continue with 30 min of aerobic exercise without signs/symptoms of physical distress.   Intensity -- THRR unchanged THRR unchanged -- THRR unchanged     Progression   Progression -- Continue to progress workloads to maintain intensity without signs/symptoms of physical distress. Continue to progress workloads to maintain intensity without signs/symptoms of physical distress. -- Continue to progress workloads to maintain intensity without signs/symptoms of physical distress.   Average METs -- 1.93 2.16 -- 2.57     Resistance Training   Training Prescription --  Yes Yes -- Yes   Weight -- 3 lb 3 lb -- 3 lb   Reps -- 10-15 10-15 -- 10-15     Interval Training   Interval Training -- No No -- No     Treadmill   MPH -- 0.7 1.1 -- 1.3   Grade -- 0 0.5 -- 0   Minutes -- 15 15 -- 15   METs -- 1.5 1.92 -- 2     Recumbant Bike   Level -- 2 1 -- 1   Watts -- -- -- -- 16   Minutes -- 15 15 -- 15   METs -- 2 2.3 -- 2.52  NuStep   Level -- 2 -- -- --   Minutes -- 15 -- -- --   METs -- 2.2 -- -- --     REL-XR   Level -- -- -- -- 1   Minutes -- -- -- -- 15   METs -- -- -- -- 3.2     T5 Nustep   Level -- 1 -- -- --   Minutes -- 15 -- -- --   METs -- 2 -- -- --     Home Exercise Plan   Plans to continue exercise at -- -- -- Home (comment)  walking, weights, staff videos Home (comment)  walking, weights, staff videos   Frequency -- -- -- Add 2 additional days to program exercise sessions. Add 2 additional days to program exercise sessions.   Initial Home Exercises Provided -- -- -- 06/08/20 06/08/20    Row Name 06/30/20 1300 07/14/20 1500 07/30/20 0900 08/12/20 1500 08/25/20 1400     Response to Exercise   Blood Pressure (Admit) 122/62 104/64 126/64 110/60 108/62   Blood Pressure (Exercise) 124/70 148/60 -- -- --   Blood Pressure (Exit) 122/64 98/60 102/60 100/58 98/60   Heart Rate (Admit) 92 bpm 97 bpm 110 bpm 106 bpm 107 bpm   Heart Rate (Exercise) 108 bpm 117 bpm 128 bpm 100 bpm 108 bpm   Heart Rate (Exit) 92 bpm 99 bpm 104 bpm 102 bpm 109 bpm   Oxygen Saturation (Admit) -- -- -- 98 % 95 %   Oxygen Saturation (Exercise) -- -- -- 97 % 87 %   Oxygen Saturation (Exit) -- -- -- 96 % 98 %   Rating of Perceived Exertion (Exercise) _0 Symptoms _1    Duration Continue with 30 min of aerobic exercise without signs/symptoms of physical distress. Continue with 30 min of aerobic exercise without signs/symptoms of physical distress. Continue with 30 min of aerobic exercise without signs/symptoms of physical  distress. Continue with 30 min of aerobic exercise without signs/symptoms of physical distress. Continue with 30 min of aerobic exercise without signs/symptoms of physical distress.   Intensity _2      Progression   Progression Continue to progress workloads to maintain intensity without signs/symptoms of physical distress. Continue to progress workloads to maintain intensity without signs/symptoms of physical distress. Continue to progress workloads to maintain intensity without signs/symptoms of physical distress. Continue to progress workloads to maintain intensity without signs/symptoms of physical distress. Continue to progress workloads to maintain intensity without signs/symptoms of physical distress.   Average METs 2.2 2.44 2.31 1.77 2.37     Resistance Training   Training Prescription _3    Weight 3 lb 3 lb 5 lb 3 lb 3 lb   Reps 10-15 10-15 10-15 10-15 10-15     Interval Training   Interval Training _4      Treadmill   MPH 1.3 1.3 1.6 1 1.1   Grade 0 0 0 0 --   Minutes _5 METs 2 2 2.23 1.77 1.84     Recumbant Bike   Level -- -- -- -- 2   Minutes -- -- -- -- 15   METs -- -- -- -- 2.91     REL-XR   Level _6 -- --   Minutes _7 -- --   METs -- 2.9 2.3 -- --  Home Exercise Plan   Plans to continue exercise at Home (comment)  walking, weights, staff videos Home (comment)  walking, weights, staff videos Home (comment)  walking, weights, staff videos Home (comment)  walking, weights, staff videos Home (comment)  walking, weights, staff videos   Frequency Add 2 additional days to program exercise sessions. Add 2 additional days to program exercise sessions. Add 2 additional days to program exercise sessions. Add 2 additional days to program exercise sessions. Add 2 additional days to program exercise sessions.   Initial Home Exercises Provided 06/08/20 06/08/20  06/08/20 06/08/20 06/08/20            Exercise Comments:   Exercise Comments     Row Name 08/31/20 1423           Exercise Comments Pt able to follow exercise prescription today without complaint.  Discussed weight gain of approx. 12 lbs since his last visit on 7/27. Pt stated he has been in the bed for about a week due to a reaction with chemo medication. Pt stated no difficulty breathing and was feeling well. Will continue to monitor for progression.                Exercise Goals and Review:   Exercise Goals     Row Name 05/04/20 1304             Exercise Goals   Increase Physical Activity Yes       Intervention Provide advice, education, support and counseling about physical activity/exercise needs.;Develop an individualized exercise prescription for aerobic and resistive training based on initial evaluation findings, risk stratification, comorbidities and participant's personal goals.       Expected Outcomes Short Term: Attend rehab on a regular basis to increase amount of physical activity.;Long Term: Exercising regularly at least 3-5 days a week.;Long Term: Add in home exercise to make exercise part of routine and to increase amount of physical activity.       Increase Strength and Stamina Yes       Intervention Provide advice, education, support and counseling about physical activity/exercise needs.;Develop an individualized exercise prescription for aerobic and resistive training based on initial evaluation findings, risk stratification, comorbidities and participant's personal goals.       Expected Outcomes Short Term: Increase workloads from initial exercise prescription for resistance, speed, and METs.;Short Term: Perform resistance training exercises routinely during rehab and add in resistance training at home;Long Term: Improve cardiorespiratory fitness, muscular endurance and strength as measured by increased METs and functional capacity (6MWT)       Able to  understand and use rate of perceived exertion (RPE) scale Yes       Intervention Provide education and explanation on how to use RPE scale       Expected Outcomes Short Term: Able to use RPE daily in rehab to express subjective intensity level;Long Term:  Able to use RPE to guide intensity level when exercising independently       Able to understand and use Dyspnea scale Yes       Intervention Provide education and explanation on how to use Dyspnea scale       Expected Outcomes Short Term: Able to use Dyspnea scale daily in rehab to express subjective sense of shortness of breath during exertion;Long Term: Able to use Dyspnea scale to guide intensity level when exercising independently       Knowledge and understanding of Target Heart Rate Range (THRR) Yes       Intervention  Provide education and explanation of THRR including how the numbers were predicted and where they are located for reference       Expected Outcomes Short Term: Able to state/look up THRR;Short Term: Able to use daily as guideline for intensity in rehab;Long Term: Able to use THRR to govern intensity when exercising independently       Able to check pulse independently Yes       Intervention Provide education and demonstration on how to check pulse in carotid and radial arteries.;Review the importance of being able to check your own pulse for safety during independent exercise       Expected Outcomes Short Term: Able to explain why pulse checking is important during independent exercise;Long Term: Able to check pulse independently and accurately       Understanding of Exercise Prescription Yes       Intervention Provide education, explanation, and written materials on patient's individual exercise prescription       Expected Outcomes Short Term: Able to explain program exercise prescription;Long Term: Able to explain home exercise prescription to exercise independently                Exercise Goals Re-Evaluation :  Exercise  Goals Re-Evaluation     Row Name 05/14/20 1407 05/20/20 1553 06/02/20 1332 06/08/20 1406 06/17/20 0757     Exercise Goal Re-Evaluation   Exercise Goals Review Increase Physical Activity;Able to understand and use rate of perceived exertion (RPE) scale;Knowledge and understanding of Target Heart Rate Range (THRR);Understanding of Exercise Prescription;Increase Strength and Stamina;Able to check pulse independently Increase Physical Activity;Increase Strength and Stamina;Understanding of Exercise Prescription Increase Physical Activity;Increase Strength and Stamina Increase Physical Activity;Increase Strength and Stamina;Understanding of Exercise Prescription Increase Physical Activity;Increase Strength and Stamina;Understanding of Exercise Prescription   Comments Reviewed RPE and dyspnea scales, THR and program prescription with pt today.  Pt voiced understanding and was given a copy of goals to take home. Drummer is off to a good start in rehab.  He has completed his first three full days of exercise.  We will continue to monitor his progress. Teixeira has increased to 1.1 mph on TM.  He is close to target HR range.  Staff will monitor progress. [place] is doing well in rehab.  He is feeling like his strength and stamina are recoving.  He does not get as tired as quickly.  I he was able to take trash out last week without stopping!!  Reviewed home exercise with pt today.  Pt plans to walk and use staff videos at home for exercise.  Reviewed THR, pulse, RPE, sign and symptoms, pulse oximetery and when to call 911 or MD.  Also discussed weather considerations and indoor options.  Pt voiced understanding. Eckels has been doing well in rehab.  He would benefit from improved attendance as his progression is lacking.  He was down to 1.3 mph on treadmill at last session.  We will continue to monitor his progress.   Expected Outcomes Short: Use RPE daily to regulate intensity. Long: Follow program prescription in THR. Short:  Continue to attend rehab regularly Long: Continue to follow program prescription Short: work in [employer] range Long: contineu to build stamina Short: start to add in more walk and staff videos at home Long: Continue to build stamina Short: Improved attendance Long: Continue to improve stamina    Row Name 06/24/20 1427 06/30/20 1316 07/14/20 1544 07/29/20 1440 07/30/20 0904     Exercise Goal Re-Evaluation   Exercise Goals  Review Increase Physical Activity;Increase Strength and Stamina Increase Physical Activity;Increase Strength and Stamina Increase Physical Activity;Increase Strength and Stamina;Understanding of Exercise Prescription Understanding of Exercise Prescription Increase Physical Activity;Increase Strength and Stamina   Comments Arca is walking on days not at [facility].  He is still not lifting too heavy but does some yard work.  He has noticed he doesnt have to stop as much walking down driveway.  He would have to stop both ways before he was exercising.  He also walked in from patient parking instead of using valet. Bourque missed some sessions in May due to other health issues.  He is getting back on track with attendance. [place] continues to have appointments for chemo treatments which inhibit him from attending rehab regularly.  We will continue to encourge improved attendance and increase workloads.  We will continue to monitor his progress. Mapel wants to be able to walk outside if weather is permitting. Informed him about the [facility] down the hall and is taking it into consideration. Gave patient information pamphlet on the [place]. Tiberio is doing well. He has built up his tolerance on the treadmill to a 1.6 speed and 5 lbs for handweights. Will continue to monitor.   Expected Outcomes Short: maintain consistent exercise Long: continue to buils stamina SHort: attend at least twice per week Long: improve overall stamina Short: Consistent attendance Long: improve stamina Short: check to see if he has [place]  and fit. Long: maintain a workout routine independently. Short: Continue exercise prescription Long: Continue to build up overall strength and stamina    Row Name 08/12/20 1520 08/19/20 1407 08/25/20 1418         Exercise Goal Re-Evaluation   Exercise Goals Review Increase Physical Activity;Increase Strength and Stamina;Understanding of Exercise Prescription Increase Physical Activity;Increase Strength and Stamina;Understanding of Exercise Prescription Increase Physical Activity;Increase Strength and Stamina     Comments Marchetti is doing well in rehab.  He is getting in 30 min on the treadmill and was encouraged to use other equipment when there are options open.  His attendance has been spotty with his cancer treatments.  We will continue to monitor his progress. [place] is doing well. He is walking outside for about 10 minutes at a time two times per day. We discussed trying to increase his time of walking to increase a minimum of 30 minutes at one time. We reviewed weather conditions outside and be minful not to go outside when it is too hot. He received out Youtube channel to look at for other exercises. He is using resistance bands almost every day working all major muscle groups. Nazar has not been attending consistently during the month of July.  Staff will encourage regular attendance and remind him of benefits of exercise     Expected Outcomes Short: Continue to improve stamina and improve attendance Long: Conitnue to improve stamina Short: Increase duration for exercise Long: Continue to exercise independently at appropriate prescription Short: attend consistently Long:  build overall stamina              Discharge Exercise Prescription (Final Exercise Prescription Changes):  Exercise Prescription Changes - 08/25/20 1400       Response to Exercise   Blood Pressure (Admit) 108/62    Blood Pressure (Exit) 98/60    Heart Rate (Admit) 107 bpm    Heart Rate (Exercise) 108 bpm    Heart Rate  (Exit) 109 bpm    Oxygen Saturation (Admit) 95 %    Oxygen Saturation (Exercise)  87 %    Oxygen Saturation (Exit) 98 %    Rating of Perceived Exertion (Exercise) 11    Symptoms none    Duration Continue with 30 min of aerobic exercise without signs/symptoms of physical distress.    Intensity THRR unchanged      Progression   Progression Continue to progress workloads to maintain intensity without signs/symptoms of physical distress.    Average METs 2.37      Resistance Training   Training Prescription Yes    Weight 3 lb    Reps 10-15      Interval Training   Interval Training No      Treadmill   MPH 1.1    Minutes 15    METs 1.84      Recumbant Bike   Level 2    Minutes 15    METs 2.91      Home Exercise Plan   Plans to continue exercise at Home (comment)   walking, weights, staff videos   Frequency Add 2 additional days to program exercise sessions.    Initial Home Exercises Provided 06/08/20             Nutrition:  Target Goals: Understanding of nutrition guidelines, daily intake of sodium <1560m, cholesterol <2033m calories 30% from fat and 7% or less from saturated fats, daily to have 5 or more servings of fruits and vegetables.  Education: All About Nutrition: -Group instruction provided by verbal, written material, interactive activities, discussions, models, and posters to present general guidelines for heart healthy nutrition including fat, fiber, MyPlate, the role of sodium in heart healthy nutrition, utilization of the nutrition label, and utilization of this knowledge for meal planning. Follow up email sent as well. Written material given at graduation. [facility]  Date 06/03/20  [facility] Code 1- Verbalizes Understanding       Biometrics:  Pre Biometrics - 05/04/20 1236       Pre Biometrics   Height 6' (1.829 m)    Weight 203 lb 8 oz (92.3 kg)    BMI  (Calculated) 27.59    Single Leg Stand 15.9 seconds              Nutrition Therapy Plan and Nutrition Goals:  Nutrition Therapy & Goals - 05/19/20 1002       Nutrition Therapy   Diet Heart healthy, low Na, Diabetes friendly    Protein (specify units) 95-110g (active cancer - not in treatment yet)    Fiber 30 grams    Whole Grain Foods 3 servings    Saturated Fats 12 max. grams    Fruits and Vegetables 8 servings/day    Sodium 1.5 grams      Personal Nutrition Goals   Nutrition Goal ST: Increase fruit/vegetable intake to 5 servings per day (eat a rainbow per week), limit processed meat to <2x/month, mix brown rice with white rice to see how he enjoys it. LT: meet nutritional needs going through treatment, make sure RD is part of care team    Comments He has active Cancer, but needs to talk with cardiologist regarding his heart status and cancer treatment. The oncologists say he needs to begin treatment soon. Encouraged to ensure RD is a part of his treatment team. He is honoring his hunger cues and eating until satisfied, but not uncomfortably full. He is no longer eating red meat every day. He eats mostly chicken, fish,  shrimp, and pork chops. He likes to eat many green vegetables which he will steam and he enjoys fruit - 3 servings per day normally (2 fruits and a vegetable; canned fruit typically -in own juice). He uses mostly olive oil, if eating eggs will use bacon fat - eats processed meat like bacon 2x/month. Does not eat much bread, but he enjoys potatoes. A1C: 6.7. He does not take his BG at home and does not have a monitor at home. B: fruit cocktail or oatmeal or griits with dried fruit or a pad of butter; this morning he had an english muffin (regular) with strawberry jelly, butter, and a salmon patty with coffee (2 splenda and half and half) and apple juice for his medications. S:2 graham cracker with PB and 2% milk or white bread and [place] sandwich D: pork chop with mac and  cheese and fried apples and iced tea (sweet) tonight. He will occassionally have shot of liquor or wine. Drinks: water and occassionally pepsi, coke, dr pepper, 7-up. He reports liking whole wheat bread, but does not like it every day. Discussed heart healthy eating, diabetes friendly eating, and eating during cancer treatment (discussed possible barriers and increased needs).      Intervention Plan   Intervention Prescribe, educate and counsel regarding individualized specific dietary modifications aiming towards targeted core components such as weight, hypertension, lipid management, diabetes, heart failure and other comorbidities.;Nutrition handout(s) given to patient.    Expected Outcomes Short Term Goal: Understand basic principles of dietary content, such as calories, fat, sodium, cholesterol and nutrients.;Short Term Goal: A plan has been developed with personal nutrition goals set during dietitian appointment.;Long Term Goal: Adherence to prescribed nutrition plan.             Nutrition Assessments:  MEDIFICTS Score Key: ?70 Need to make dietary changes  40-70 Heart Healthy Diet ? 40 Therapeutic Level Cholesterol Diet  Flowsheet Row Cardiac Rehab from [facility]/04/2020 in [facility] Cardiac and Pulmonary Rehab  Picture Your Plate Total Score on Admission 62      Picture Your Plate Scores: <23 Unhealthy dietary pattern with much room for improvement. 41-50 Dietary pattern unlikely to meet recommendations for good health and room for improvement. 51-60 More healthful dietary pattern, with some room for improvement.  >60 Healthy dietary pattern, although there may be some specific behaviors that could be improved.    Nutrition Goals Re-Evaluation:  Nutrition Goals Re-Evaluation     [place] Name 06/08/20 1409 06/24/20 1430 07/29/20 1444 08/19/20 1412       Goals   Nutrition Goal ST: Increase fruit/vegetable intake to 5 servings per day (eat a rainbow per week), limit processed meat to  <2x/month, mix brown rice with white rice to see how he enjoys it. LT: meet nutritional needs going through treatment, make sure RD is part of care team -- add products that have potassium to his diet. Continue to follow diabetic friendly diet    Comment He met with Lenna Sciara recently and has already backed off his portion sizes.  He is eating more fruit than ever before.  He is trying to get a good variety.  He is sticking with 2-21/2 meals a day.  Usually breakfast, snack, and dinner. Groft reports eating 2 good meals and a snack each day.  He watches what he eats and not as much red meat.  He is eating fresh and frozen vegetables.  He has also increased fruit - avoiding grapefruit due to medication interaction.  He eats a  meat with pasta salad and a fruit for dinner. He states that his potassium has been low in the past and is working on more intake. Magnussen is doing well. His appetite is better and he tries to eats 4 times a day, typically smaller meals. Still reserving to leaner meats such as fish and chicken and limiting read meat. He has dropped 4 lbs in the last 2 weeks and is pleased, lost a total of 25 lbs before surgey. HE is still trying to eat more potassium.    Expected Outcome Short: Continue twith changes Long: COnitnue to focus on healthy eating. ST/LT:  continue with current plan Short: eat more foods with potassium. Long: maintain potassium levels. Short: Continue checking weight to maintain Long: Continue to heart healthy diet             Nutrition Goals Discharge (Final Nutrition Goals Re-Evaluation):  Nutrition Goals Re-Evaluation - 08/19/20 1412       Goals   Nutrition Goal Continue to follow diabetic friendly diet    Comment Galen is doing well. His appetite is better and he tries to eats 4 times a day, typically smaller meals. Still reserving to leaner meats such as fish and chicken and limiting read meat. He has dropped 4 lbs in the last 2 weeks and is pleased, lost a total of 25  lbs before surgey. HE is still trying to eat more potassium.    Expected Outcome Short: Continue checking weight to maintain Long: Continue to heart healthy diet             Psychosocial: Target Goals: Acknowledge presence or absence of significant depression and/or stress, maximize coping skills, provide positive support system. Participant is able to verbalize types and ability to use techniques and skills needed for reducing stress and depression.   Education: Stress, Anxiety, and Depression - Group verbal and visual presentation to define topics covered.  Reviews how body is impacted by stress, anxiety, and depression.  Also discusses healthy ways to reduce stress and to treat/manage anxiety and depression.  Written material given at graduation.   Education: Sleep Hygiene -Provides group verbal and written instruction about how sleep can affect your health.  Define sleep hygiene, discuss sleep cycles and impact of sleep habits. Review good sleep hygiene tips.    Initial Review & Psychosocial Screening:  Initial Psych Review & Screening - 04/30/20 0913       Initial Review   Current issues with None Identified      Family Dynamics   Good Support System? Yes    Comments He can look to his wife  for support. Coker has a positive outlook on his health.      Barriers   Psychosocial barriers to participate in program There are no identifiable barriers or psychosocial needs.;The patient should benefit from training in stress management and relaxation.      Screening Interventions   Interventions To provide support and resources with identified psychosocial needs;Provide feedback about the scores to participant;Encouraged to exercise    Expected Outcomes Short Term goal: Utilizing psychosocial counselor, staff and physician to assist with identification of specific Stressors or current issues interfering with healing process. Setting desired goal for each stressor or current issue  identified.;Long Term Goal: Stressors or current issues are controlled or eliminated.;Short Term goal: Identification and review with participant of any Quality of Life or Depression concerns found by scoring the questionnaire.;Long Term goal: The participant improves quality of Life and PHQ9 Scores as  seen by post scores and/or verbalization of changes             Quality of Life Scores:   Quality of Life - 05/04/20 1234       Quality of Life   Select Quality of Life      Quality of Life Scores   Health/Function Pre 20.07 %    Socioeconomic Pre 25.57 %    Psych/Spiritual Pre 30 %    Family Pre 27 %    GLOBAL Pre 24.31 %            Scores of 19 and below usually indicate a poorer quality of life in these areas.  A difference of  2-3 points is a clinically meaningful difference.  A difference of 2-3 points in the total score of the Quality of Life Index has been associated with significant improvement in overall quality of life, self-image, physical symptoms, and general health in studies assessing change in quality of life.  PHQ-9: Recent Review Flowsheet Data     Depression screen [facility] 2/9 05/04/2020   Decreased Interest 0   Down, Depressed, Hopeless 0   PHQ - 2 Score 0   Altered sleeping 2   Tired, decreased energy 1   Change in appetite 1   Feeling bad or failure about yourself  0   Trouble concentrating 0   Moving slowly or fidgety/restless 0   Suicidal thoughts 0   PHQ-9 Score 4   Difficult doing work/chores Not difficult at all      Interpretation of Total Score  Total Score Depression Severity:  1-4 = Minimal depression, 5-9 = Mild depression, 10-14 = Moderate depression, 15-19 = Moderately severe depression, 20-27 = Severe depression   Psychosocial Evaluation and Intervention:  Psychosocial Evaluation - 04/30/20 0914       Psychosocial Evaluation & Interventions   Interventions Encouraged to exercise with the program and follow exercise  prescription;Stress management education;Relaxation education    Comments He can look to his wife for support. Rendleman has a positive outlook on his health.    Expected Outcomes Short: Exercise regularly to support mental health and notify staff of any changes. Long: maintain mental health and well being through teaching of rehab or prescribed medications independently.    Continue Psychosocial Services  Follow up required by staff             Psychosocial Re-Evaluation:  Psychosocial Re-Evaluation     [place] Name 06/08/20 1407 06/24/20 1432 07/29/20 1436 08/19/20 1416       Psychosocial Re-Evaluation   Current issues with Current Stress Concerns -- None Identified Current Stress Concerns    Comments Last night was a rough sleep night.  However, he usually gets 7-9 hours.  Today, he feeling off his game and out of sorts with blurry vision upon standing. Mentally he is doing well.  Other than his health he has no major stressors.  They went to a wedding over the weekend.  He is generally a positive and happy person. Packett gets 7.5-9.5 hours of sleep and feels well rested.  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  He will start this in next 20 days.  They are starting with small doses because of his heart.  He feels confident in the plan his Drs have for him. Patient reports no issues with their current mental states, sleep, stress, depression or anxiety. Will follow up with patient in a few weeks for any changes. His sleep has  been a little better but he has to take lasix which can keep him up sometimes. Beltre has started his chemo and currently experiencing stress dealing with that. he is tolerating the best that he can. He is starting a new cycle next week and not sure on the side effects or how he is going to feel. His sleep is much better since his surgery. Denies other complaints.    Expected Outcomes Short: Continue to keep close eye on health Long: Conitnue to stay positive. Short: continue to work  with Drs on treatment plan Long: continue to exercise to help manage stress Short: Continue to exercise regularly to support mental health and notify staff of any changes. Long: maintain mental health and well being through teaching of rehab or prescribed medications independently. Short: Continue attendance with rehab Long: Utilize exercise for stress management and maintain posititive attitude    Interventions Encouraged to attend Cardiac Rehabilitation for the exercise -- Encouraged to attend Cardiac Rehabilitation for the exercise Encouraged to attend Cardiac Rehabilitation for the exercise    Continue Psychosocial Services  Follow up required by staff -- Follow up required by staff Follow up required by staff             Psychosocial Discharge (Final Psychosocial Re-Evaluation):  Psychosocial Re-Evaluation - 08/19/20 1416       Psychosocial Re-Evaluation   Current issues with Current Stress Concerns    Comments Scow has started his chemo and currently experiencing stress dealing with that. he is tolerating the best that he can. He is starting a new cycle next week and not sure on the side effects or how he is going to feel. His sleep is much better since his surgery. Denies other complaints.    Expected Outcomes Short: Continue attendance with rehab Long: Utilize exercise for stress management and maintain posititive attitude    Interventions Encouraged to attend Cardiac Rehabilitation for the exercise    Continue Psychosocial Services  Follow up required by staff             Vocational Rehabilitation: Provide vocational rehab assistance to qualifying candidates.   Vocational Rehab Evaluation & Intervention:   Education: Education Goals: Education classes will be provided on a variety of topics geared toward better understanding of heart health and risk factor modification. Participant will state understanding/return demonstration of topics presented as noted by education test  scores.  Learning Barriers/Preferences:  Learning Barriers/Preferences - 04/30/20 0910       Learning Barriers/Preferences   Learning Barriers None    Learning Preferences None             General Cardiac Education Topics:  AED/CPR: - Group verbal and written instruction with the use of models to demonstrate the basic use of the AED with the basic ABC's of resuscitation.   Anatomy and Cardiac Procedures: - Group verbal and visual presentation and models provide information about basic cardiac anatomy and function. Reviews the testing methods done to diagnose heart disease and the outcomes of the test results. Describes the treatment choices: Medical Management, Angioplasty, or Coronary Bypass Surgery for treating various heart conditions including Myocardial Infarction, Angina, Valve Disease, and Cardiac Arrhythmias.  Written material given at graduation. Flowsheet Row Cardiac Rehab from 08/19/2020 in [facility] Cardiac and Pulmonary Rehab  Date 07/22/20  Educator [facility]  Instruction Review Code 1- Verbalizes Understanding       Medication Safety: - Group verbal and visual instruction to review commonly prescribed medications for heart and lung disease.  Reviews the medication, class of the drug, and side effects. Includes the steps to properly store meds and maintain the prescription regimen.  Written material given at graduation. Flowsheet Row Cardiac Rehab from 08/19/2020 in [facility] Cardiac and Pulmonary Rehab  Date 06/10/20  Educator SB  Instruction Review Code 1- Verbalizes Understanding       Intimacy: - Group verbal instruction through game format to discuss how heart and lung disease can affect sexual intimacy. Written material given at graduation..   Know Your Numbers and Heart Failure: - Group verbal and visual instruction to discuss disease risk factors for cardiac and pulmonary disease and treatment options.  Reviews associated critical values for Overweight/Obesity,  Hypertension, Cholesterol, and Diabetes.  Discusses basics of heart failure: signs/symptoms and treatments.  Introduces Heart Failure Zone chart for action plan for heart failure.  Written material given at graduation. Flowsheet Row Cardiac Rehab from 08/19/2020 in [facility] Cardiac and Pulmonary Rehab  Date 08/19/20  Educator KB  Instruction Review Code 1- Verbalizes Understanding       Infection Prevention: - Provides verbal and written material to individual with discussion of infection control including proper hand washing and proper equipment cleaning during exercise session. Flowsheet Row Cardiac Rehab from 08/19/2020 in [facility] Cardiac and Pulmonary Rehab  Date 04/30/20  Educator [facility]  Instruction Review Code 1- Verbalizes Understanding       Falls Prevention: - Provides verbal and written material to individual with discussion of falls prevention and safety. Flowsheet Row Cardiac Rehab from 08/19/2020 in [facility] Cardiac and Pulmonary Rehab  Date 04/30/20  Educator [facility]  Instruction Review Code 1- Verbalizes Understanding       Other: -Provides group and verbal instruction on various topics (see comments)   Knowledge Questionnaire Score:  Knowledge Questionnaire Score - 05/04/20 1227       Knowledge Questionnaire Score   Pre Score 22/26: Angina, Nutrition, Exercise             Core Components/Risk Factors/Patient Goals at Admission:  Personal Goals and Risk Factors at Admission - 05/04/20 1307       Core Components/Risk Factors/Patient Goals on Admission    Weight Management Yes;Weight Maintenance    Intervention Weight Management: Develop a combined nutrition and exercise program designed to reach desired caloric intake, while maintaining appropriate intake of nutrient and fiber, sodium and fats, and appropriate energy expenditure required for the weight goal.;Weight Management: Provide education and appropriate resources to help participant work on and attain dietary  goals.;Weight Management/Obesity: Establish reasonable short term and long term weight goals.    Admit Weight 203 lb (92.1 kg)    Goal Weight: Short Term 203 lb (92.1 kg)    Goal Weight: Long Term 203 lb (92.1 kg)    Expected Outcomes Short Term: Continue to assess and modify interventions until short term weight is achieved;Long Term: Adherence to nutrition and physical activity/exercise program aimed toward attainment of established weight goal;Weight Maintenance: Understanding of the daily nutrition guidelines, which includes 25-35% calories from fat, 7% or less cal from saturated fats, less than 22m cholesterol, less than 1.5gm of sodium, & 5 or more servings of fruits and vegetables daily;Understanding recommendations for meals to include 15-35% energy as protein, 25-35% energy from fat, 35-60% energy from carbohydrates, less than 2037mof dietary cholesterol, 20-35 gm of total fiber daily;Understanding of distribution of calorie intake throughout the day with the consumption of 4-5 meals/snacks    Diabetes Yes    Intervention Provide education about signs/symptoms and action  to take for hypo/hyperglycemia.;Provide education about proper nutrition, including hydration, and aerobic/resistive exercise prescription along with prescribed medications to achieve blood glucose in normal ranges: Fasting glucose 65-99 mg/dL    Expected Outcomes Short Term: Participant verbalizes understanding of the signs/symptoms and immediate care of hyper/hypoglycemia, proper foot care and importance of medication, aerobic/resistive exercise and nutrition plan for blood glucose control.;Long Term: Attainment of HbA1C < 7%.    Heart Failure Yes    Intervention Provide a combined exercise and nutrition program that is supplemented with education, support and counseling about heart failure. Directed toward relieving symptoms such as shortness of breath, decreased exercise tolerance, and extremity edema.    Expected Outcomes  Improve functional capacity of life;Short term: Attendance in program 2-3 days a week with increased exercise capacity. Reported lower sodium intake. Reported increased fruit and vegetable intake. Reports medication compliance.;Short term: Daily weights obtained and reported for increase. Utilizing diuretic protocols set by physician.;Long term: Adoption of self-care skills and reduction of barriers for early signs and symptoms recognition and intervention leading to self-care maintenance.    Hypertension Yes    Intervention Provide education on lifestyle modifcations including regular physical activity/exercise, weight management, moderate sodium restriction and increased consumption of fresh fruit, vegetables, and low fat dairy, alcohol moderation, and smoking cessation.;Monitor prescription use compliance.    Expected Outcomes Short Term: Continued assessment and intervention until BP is < 140/2m HG in hypertensive participants. < 130/878mHG in hypertensive participants with diabetes, heart failure or chronic kidney disease.;Long Term: Maintenance of blood pressure at goal levels.    Lipids Yes    Intervention Provide education and support for participant on nutrition & aerobic/resistive exercise along with prescribed medications to achieve LDL <7018mHDL >20m39m  Expected Outcomes Short Term: Participant states understanding of desired cholesterol values and is compliant with medications prescribed. Participant is following exercise prescription and nutrition guidelines.;Long Term: Cholesterol controlled with medications as prescribed, with individualized exercise RX and with personalized nutrition plan. Value goals: LDL < 70mg93mL > 40 mg.             Education:Diabetes - Individual verbal and written instruction to review signs/symptoms of diabetes, desired ranges of glucose level fasting, after meals and with exercise. Acknowledge that pre and post exercise glucose checks will be done for  3 sessions at entry of program. [place] [facility]  Date 04/30/20  Educator JH  [facility] Review Code 1- Verbalizes Understanding  [States nver been diagnosed with DM and does not check BG]       Core Components/Risk Factors/Patient Goals Review:   Goals and Risk Factor Review     Row Name 06/08/20 1411 06/24/20 1425 07/29/20 1446 08/19/20 1413       Core Components/Risk Factors/Patient Goals Review   Personal Goals Review Weight Management/Obesity;Hypertension;Diabetes;Heart Failure;Lipids -- Weight Management/Obesity Weight Management/Obesity;Diabetes;Hypertension    Review His weight is holding steady over the last 30 days.  He denies any heart falure symptoms and he is back to sleeping on his side.  He is staying on top of his lasix and gets between 1600-2000 ml fluid each day and he continues to measure it daily.  Blood pressures have been good in class and  he checks it on occassion at home and when he feels a little off.  We talked about the benefits of track his pressures.  He does not check his sugars as he is diet controlled.  He has  not had any feelings of feeling off with them. Mcdaniel reports his weight is staying steady within a 3 lb range.  He weighs daily.  BP has been good in class.  He saw cardiologist today and got a good report.  He goes back in 4 months. He has cut down on volume of food.  No eats 3 to 4 small meals a day. His wife and him are on a journey to eating healthier and exercising. Mario has lost 25 pounds since he had his surgery and is pleased. He is looking to maintain weight at this time. He claims he is borderline diabetic but is still taking metformin. He states his doctor says he doesn't need to check his sugars unless he is feeling symptomatic. He checks BP at home which ranges at 112-120/60-70s. BPs at rehab are stable as well. Sees his heart doctor tomorrow as he is going to talk about his SOB that  he has been having. He denies feeling those symptoms in the last week,    Expected Outcomes Short: Continue to montior weight closely Long: Conitnue to manage heart failure. Short: continue to monitor HF symptoms Short: maintain a heart healthy diet. Long: maintain portion control independently Short: Talk to doctor about SOB Long: Continue to manage lifestyle risk factors             Core Components/Risk Factors/Patient Goals at Discharge (Final Review):   Goals and Risk Factor Review - 08/19/20 1413       Core Components/Risk Factors/Patient Goals Review   Personal Goals Review Weight Management/Obesity;Diabetes;Hypertension    Review [place] has lost 25 pounds since he had his surgery and is pleased. He is looking to maintain weight at this time. He claims he is borderline diabetic but is still taking metformin. He states his doctor says he doesn't need to check his sugars unless he is feeling symptomatic. He checks BP at home which ranges at 112-120/60-70s. BPs at rehab are stable as well. Sees his heart doctor tomorrow as he is going to talk about his SOB that he has been having. He denies feeling those symptoms in the last week,    Expected Outcomes Short: Talk to doctor about SOB Long: Continue to manage lifestyle risk factors             ITP Comments:  ITP Comments     Row Name 04/30/20 0914 05/04/20 1223 05/13/20 1000 05/14/20 1407 05/19/20 1001   ITP Comments Virtual Visit completed. Patient informed on EP and RD appointment and 6 Minute walk test. Patient also informed of patient health questionnaires on My Chart. Patient Verbalizes understanding. Visit diagnosis can be found in [facility] 03/21/2020. Completed 6MWT and gym orientation. Initial ITP created and sent for review to Dr. Emily Filbert, Medical Director. 30 Day review completed. Medical Director ITP review done, changes made as directed, and signed approval by Medical Director. First full day of exercise!  Patient was oriented to  gym and equipment including functions, settings, policies, and procedures.  Patient's individual exercise prescription and treatment plan were reviewed.  All starting workloads were established based on the results of the 6 minute walk test done at initial orientation visit.  The plan for exercise progression was also introduced and progression will be customized based on patient's performance and goals. Completed initial RD Consultation    [place] Name 06/10/20 0659 07/08/20 0804 07/14/20 1543 08/05/20 0845 08/06/20 1147   ITP Comments 30 Day review completed. Medical Director ITP review done, changes  made as directed, and signed approval by Medical Director. 30 Day review completed. Medical Director ITP review done, changes made as directed, and signed approval by Medical Director. Shatto is currently undergoing chemo treatments and will be in and out of rehab. 30 Day review completed. Medical Director ITP review done, changes made as directed, and signed approval by Medical Director. [place]an has not been sleeping due to chemo and is seeing his cardiologist for shortness of breath. Will follow up next week (week of 08/10/20)    [place] Name 08/31/20 1423 09/02/20 0728         ITP Comments Pt able to follow exercise prescription today without complaint.  Discussed weight gain of approx. 12 lbs since his last visit on 7/27. Pt stated he has been in the bed for about a week due to a reaction with chemo medication. Pt stated no difficulty breathing and was feeling well. Will continue to monitor for progression. 30 Day review completed. Medical Director ITP review done, changes made as directed, and signed approval by Medical Director.               Comments:

## 2020-09-02 NOTE — Progress Notes (Signed)
Daily Session Note  Patient Details  Name: Roy [name] MRN: [id number] Date of Birth: 23-Oct-1945 Referring Provider:   Flowsheet Row Cardiac Rehab from 05/04/2020 in [facility]  Referring Provider [name] MD       Encounter Date: 09/02/2020  Check In:  Session Check In - 09/02/20 1405       Check-In   Supervising physician immediately available to respond to emergencies See telemetry face sheet for immediately available ER MD    Location [facility]    Staff Present Birdie Sons, MPA, Nino Glow, MS, ASCM CEP, Exercise Physiologist;Laureen Owens Shark, BS, RRT, CPFT    Virtual Visit No    Medication changes reported     No    Fall or balance concerns reported    No    Warm-up and Cool-down Performed on first and last piece of equipment    Resistance Training Performed Yes    VAD Patient? No    PAD/SET Patient? No      Pain Assessment   Currently in Pain? No/denies                Social History   Tobacco Use  Smoking Status Former   Types: Pipe   Quit date: 11/29/1976   Years since quitting: 43.7  Smokeless Tobacco Never    Goals Met:  Independence with exercise equipment Exercise tolerated well No report of cardiac concerns or symptoms Strength training completed today  Goals Unmet:  Not Applicable  Comments: Pt able to follow exercise prescription today without complaint.  Will continue to monitor for progression.    Dr. Emily Filbert is Medical Director for [place].  Dr. Ottie Glazier is Medical Director for [facility] Pul[facility].

## 2020-09-03 ENCOUNTER — Ambulatory Visit (HOSPITAL_COMMUNITY)
Admission: RE | Admit: 2020-09-03 | Discharge: 2020-09-03 | Disposition: A | Discharge status: Home / Self Care | Payer: Medicare HMO | Source: Ambulatory Visit | Attending: Hematology | Admitting: Hematology

## 2020-09-03 ENCOUNTER — Ambulatory Visit (HOSPITAL_COMMUNITY): Payer: Medicare HMO

## 2020-09-03 ENCOUNTER — Other Ambulatory Visit: Payer: Self-pay | Admitting: Hematology and Oncology

## 2020-09-03 ENCOUNTER — Other Ambulatory Visit: Payer: Self-pay

## 2020-09-03 ENCOUNTER — Encounter (HOSPITAL_COMMUNITY): Payer: Self-pay

## 2020-09-03 DIAGNOSIS — C8330 Diffuse large B-cell lymphoma, unspecified site: Secondary | ICD-10-CM

## 2020-09-03 DIAGNOSIS — Z5111 Encounter for antineoplastic chemotherapy: Secondary | ICD-10-CM | POA: Diagnosis not present

## 2020-09-03 DIAGNOSIS — C8339 Diffuse large B-cell lymphoma, extranodal and solid organ sites: Secondary | ICD-10-CM | POA: Diagnosis not present

## 2020-09-03 DIAGNOSIS — Z7189 Other specified counseling: Secondary | ICD-10-CM | POA: Diagnosis not present

## 2020-09-03 DIAGNOSIS — C6290 Malignant neoplasm of unspecified testis, unspecified whether descended or undescended: Secondary | ICD-10-CM | POA: Diagnosis not present

## 2020-09-03 LAB — CSF CELL COUNT WITH DIFFERENTIAL
RBC Count, CSF: 1 /mm3 — ABNORMAL HIGH
Tube #: 4
WBC, CSF: 2 /mm3 (ref 0–5)

## 2020-09-03 LAB — GLUCOSE, CSF: Glucose, CSF: 92 mg/dL — ABNORMAL HIGH (ref 40–70)

## 2020-09-03 LAB — PROTEIN, CSF: Total  Protein, CSF: 72 mg/dL — ABNORMAL HIGH (ref 15–45)

## 2020-09-03 IMAGING — RF DG FLUORO GUIDE SPINAL/SI JT INJ*L*
1 series · 1 of 1 positions shown · non-contrast
Comparison: [DATE] PET

CLINICAL DATA: Lymphoma of the testicle

EXAM:
DIAGNOSTIC LUMBAR PUNCTURE UNDER FLUOROSCOPIC GUIDANCE WITH
INTRATHECAL INJECTION

[Series 1: cp_standard · 0.18mm/px · 1 of 1 slices shown]
[im 1/1]
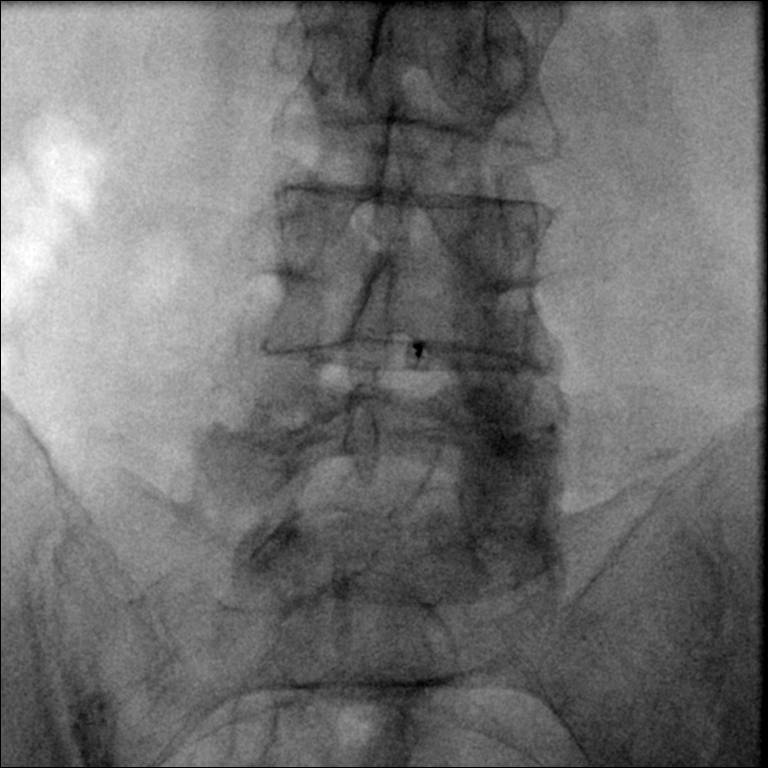

[1 of 1 positions shown; findings below may reference images not displayed]

FLUOROSCOPY TIME:  Fluoroscopy Time:  3 minutes and 18 seconds

Radiation Exposure Index (if provided by the fluoroscopic device):
66.3 mGy

Number of Acquired Spot Images: 0

PROCEDURE:
Informed consent was obtained from the patient prior to the
procedure, including potential complications of headache, allergy,
and pain. With the patient prone, the lower back was prepped with
Betadine. 1% Lidocaine was used for local anesthesia. Lumbar
puncture was performed at the L2-3 level using a 20 gauge needle.
Despite appropriate needle positioning, CSF could not be obtained.
Therefore, the L4-5 level was chosen. After appropriate anesthetic,
a 20 gauge needle was inserted. Return of clear CSF. 9 ml of CSF
were obtained for laboratory studies. Subsequently, slow injection
of the methotrexate mixture as delivered by the pharmacy (12 mg
methotrexate , 50 mg of Solu-Cortef, total 10 mm). The patient
tolerated the procedure well and there were no apparent
complications.
IMPRESSION: Non complicated lumbar puncture and intrathecal chemotherapy
injection as detailed above.

## 2020-09-03 MED ORDER — LIDOCAINE HCL 1 % IJ SOLN
INTRAMUSCULAR | Status: AC
  Filled 2020-09-03: qty 20

## 2020-09-03 MED ORDER — SODIUM CHLORIDE (PF) 0.9 % IJ SOLN
Freq: Once | INTRAMUSCULAR | Status: AC
  Filled 2020-09-03 (×5): qty 0.48

## 2020-09-04 LAB — CYTOLOGY - NON PAP

## 2020-09-08 ENCOUNTER — Ambulatory Visit
Admission: RE | Admit: 2020-09-08 | Discharge: 2020-09-08 | Disposition: A | Discharge status: Home / Self Care | Payer: Medicare HMO | Source: Ambulatory Visit | Attending: Adult Health | Admitting: Adult Health

## 2020-09-08 ENCOUNTER — Other Ambulatory Visit: Payer: Self-pay

## 2020-09-08 DIAGNOSIS — Z9079 Acquired absence of other genital organ(s): Secondary | ICD-10-CM | POA: Diagnosis not present

## 2020-09-08 DIAGNOSIS — Z8572 Personal history of non-Hodgkin lymphomas: Secondary | ICD-10-CM | POA: Diagnosis not present

## 2020-09-08 DIAGNOSIS — N5082 Scrotal pain: Secondary | ICD-10-CM | POA: Diagnosis not present

## 2020-09-08 DIAGNOSIS — C6292 Malignant neoplasm of left testis, unspecified whether descended or undescended: Secondary | ICD-10-CM

## 2020-09-08 DIAGNOSIS — Z9889 Other specified postprocedural states: Secondary | ICD-10-CM | POA: Diagnosis not present

## 2020-09-08 IMAGING — MR MR PELVIS WO/W CM
13 of 14 series · 39 of 48 positions shown · IV contrast (multihance)
Comparison: Scrotal ultrasound [DATE] and PET CT [DATE].

CLINICAL DATA: Continued scrotal pain status post left
transinguinal orchiectomy on [DATE] for primary
testicular large B-cell lymphoma.

EXAM:
MRI PELVIS WITHOUT AND WITH CONTRAST
TECHNIQUE: Multiplanar multisequence MR imaging of the pelvis was performed
both before and after administration of intravenous contrast.
CONTRAST:  18mL MULTIHANCE GADOBENATE DIMEGLUMINE 529 MG/ML IV SOLN

[Series 4: T2 · coronal · 4.0mm · 0.31mm/px · 3 of 28 slices shown (1 of 4)]
[im 1/28]
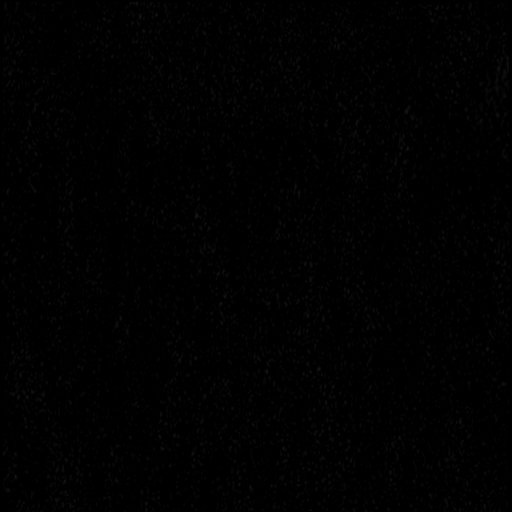
[im 14/28]
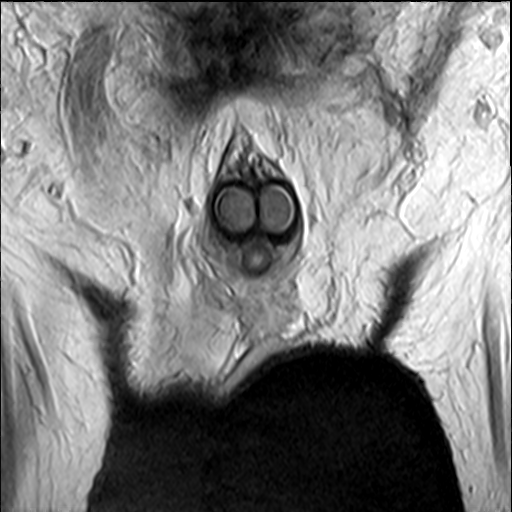
[im 28/28]
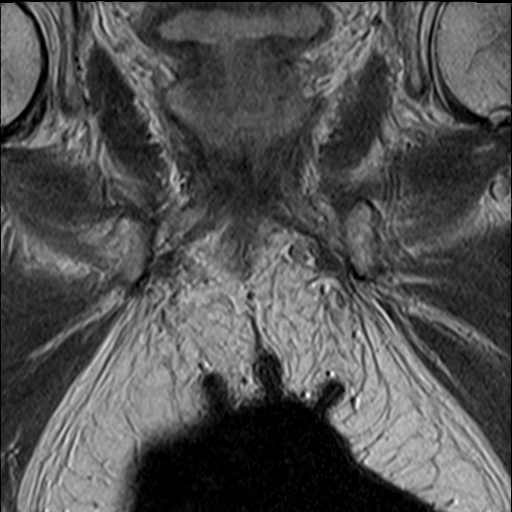

[Series 5: T2 · axial · 4.0mm · 0.31mm/px · z∈[-128,+2]mm · 2 of 30 slices shown (2 of 4)]
[im 1/30]
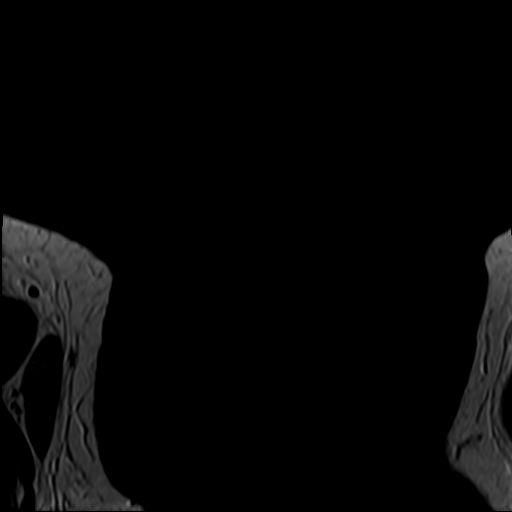
[im 30/30]
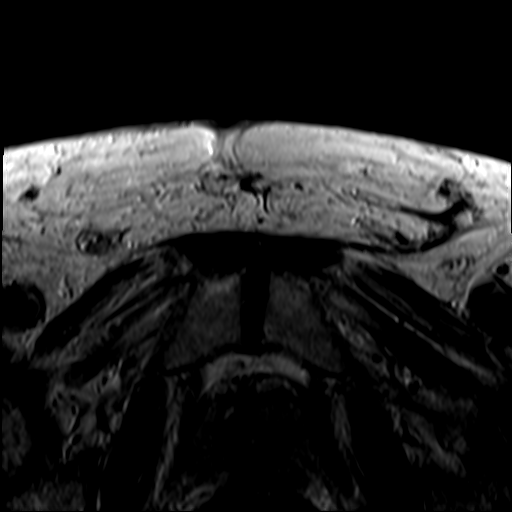

[Series 6: T2 · sagittal · 4.0mm · 0.62mm/px · 2 of 28 slices shown (3 of 4)]
[im 1/28]
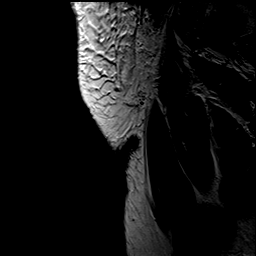
[im 28/28]
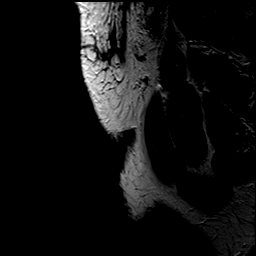

[Series 7: T1 · axial · 5.0mm · 0.66mm/px · z∈[-124,+14]mm · 2 of 23 slices shown]
[im 1/23]
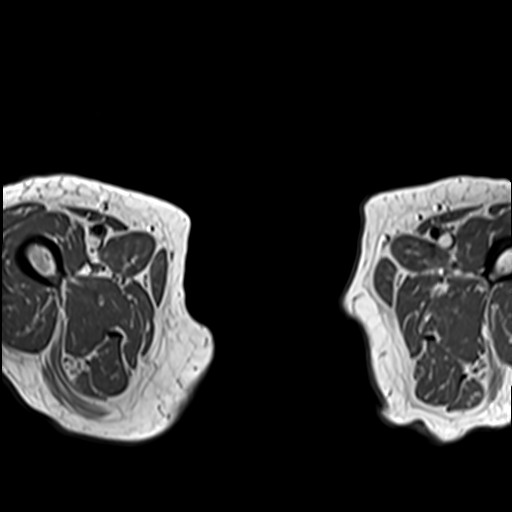
[im 23/23]
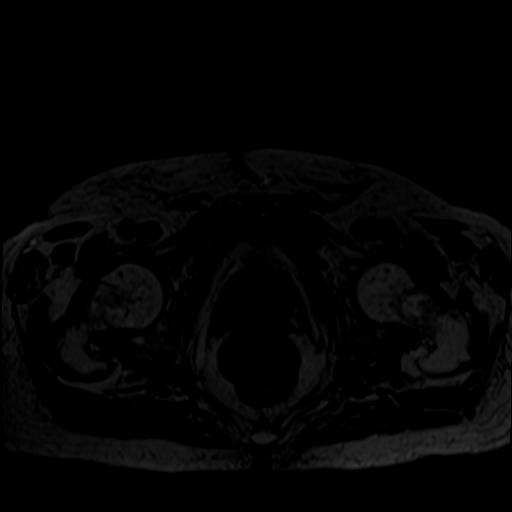

[Series 8: T2 · axial · 5.5mm · 0.66mm/px · z∈[-105,+21]mm · 2 of 20 slices shown (4 of 4)]
[im 1/20]
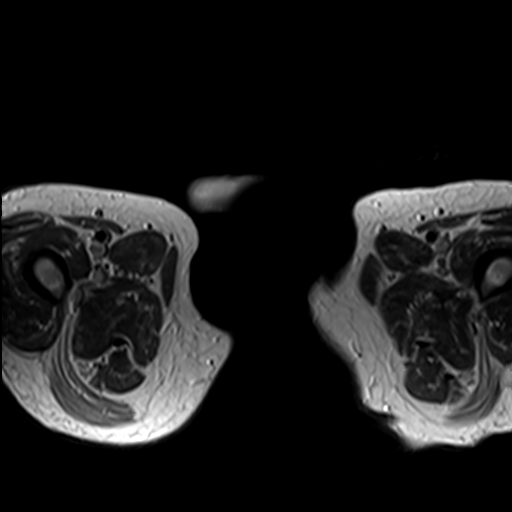
[im 20/20]
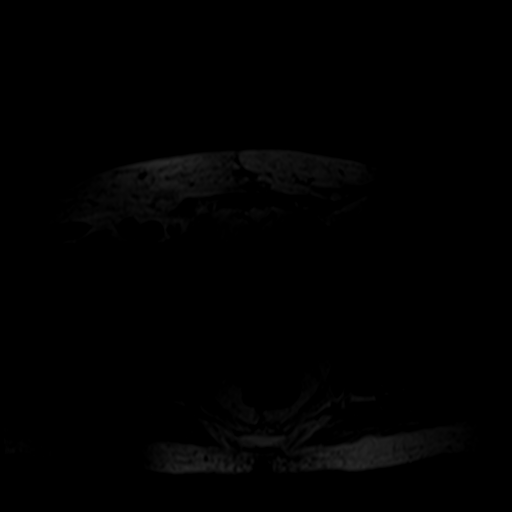

[Series 9: T2 fat-sat · sagittal · 4.0mm · 0.62mm/px · 2 of 28 slices shown]
[im 1/28]
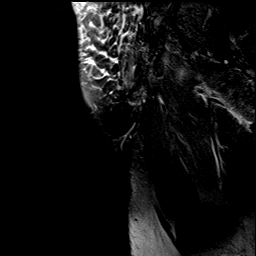
[im 28/28]
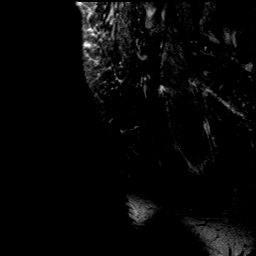

[Series 10: DWI · axial · 6.0mm · 1.77mm/px · z∈[-120,+45]mm · 6 of 72 slices shown]
[im 1/72]
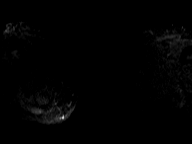
[im 15/72]
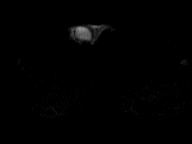
[im 29/72]
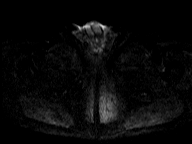
[im 43/72]
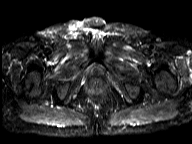
[im 57/72]
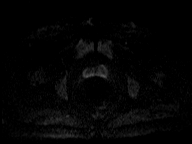
[im 72/72]
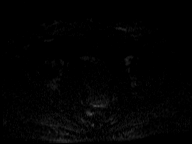

[Series 11: axial dwi_adc · axial · 6.0mm · 1.77mm/px · z∈[-120,+45]mm · 2 of 24 slices shown]
[im 1/24]
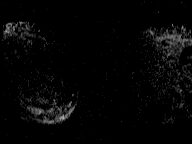
[im 24/24]
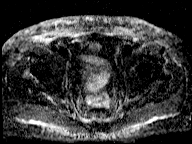

[Series 12: t2_tse axial fs · axial · 6.0mm · 1.06mm/px · 1 of 17 slices shown]
[im 1/17]
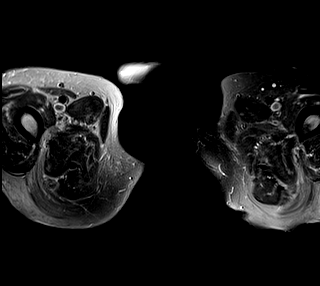

[Series 13: T1 dynamic · sagittal · non-contrast · 2.5mm · 0.68mm/px · 4 of 56 slices shown (1 of 3)]
[im 1/56]
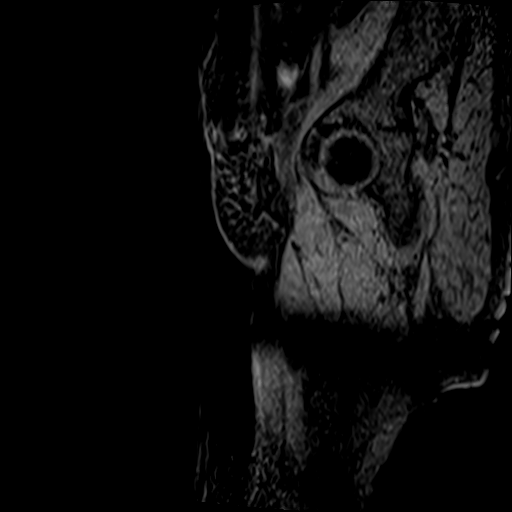
[im 19/56]
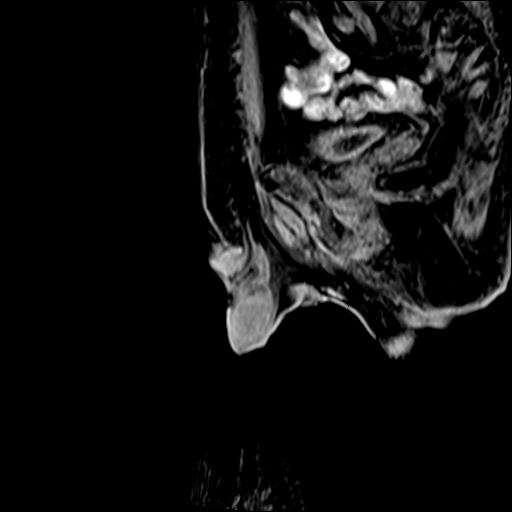
[im 37/56]
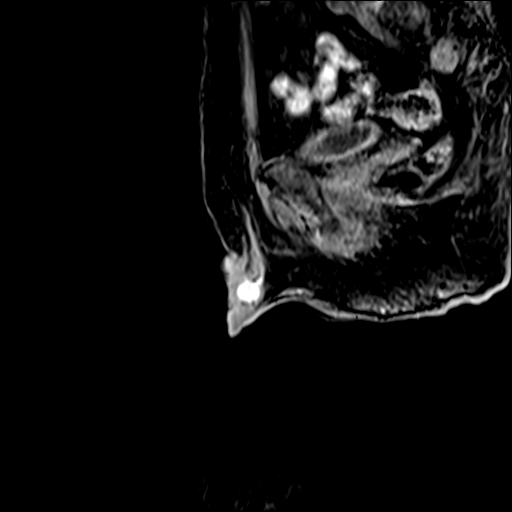
[im 56/56]
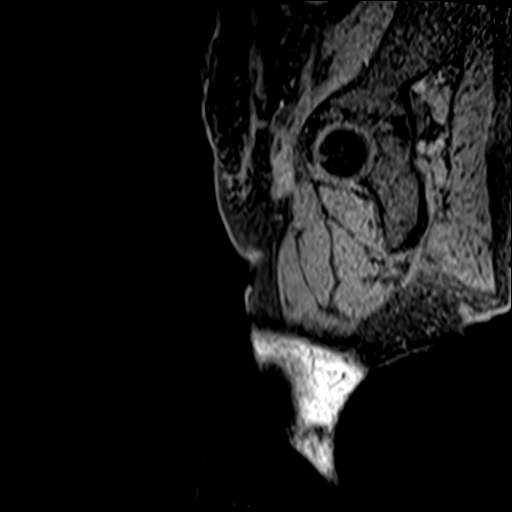

[Series 14: T1 dynamic · axial · non-contrast · 2.3mm · 1.37mm/px · z∈[-132,+31]mm · 6 of 72 slices shown (2 of 3)]
[im 1/72]
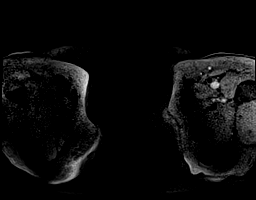
[im 15/72]
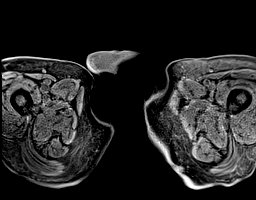
[im 29/72]
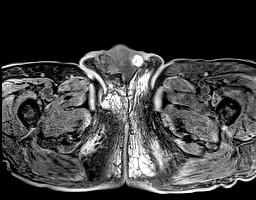
[im 43/72]
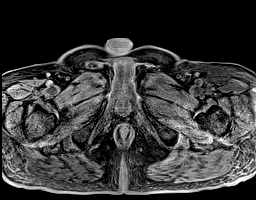
[im 57/72]
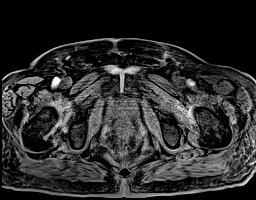
[im 72/72]
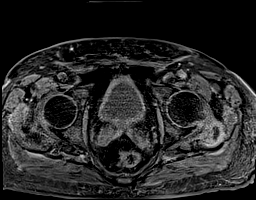

[Series 15: T1 dynamic post-contrast · axial · 2.3mm · 1.37mm/px · z∈[-132,+31]mm · 6 of 72 slices shown]
[im 1/72]
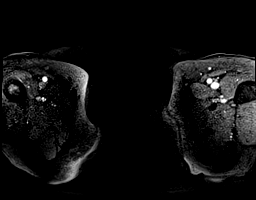
[im 15/72]
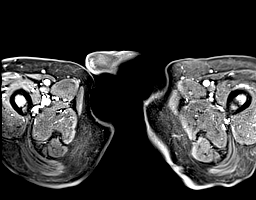
[im 29/72]
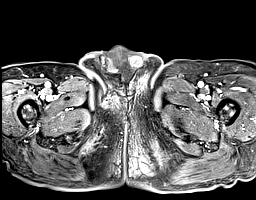
[im 43/72]
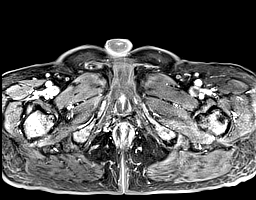
[im 57/72]
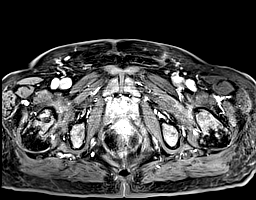
[im 72/72]
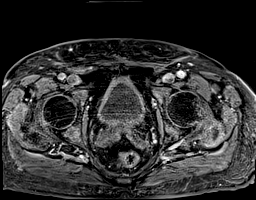

[Series 16: T1 dynamic · axial · 2.3mm · 1.37mm/px · 1 of 72 slices shown (3 of 3)]
[im 1/72]
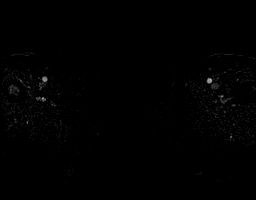

[39 of 48 positions shown; findings below may reference images not displayed]

FINDINGS: Urinary Tract: Urinary bladder is unremarkable for degree of
distension.

Bowel:  Unremarkable visualized pelvic bowel loops.

Vascular/Lymphatic: No pathologically enlarged lymph nodes. No
significant vascular abnormality seen.

Reproductive: Postsurgical change of left transinguinal orchiectomy.
Within the left hemiscrotum there is a 1.8 x 1.5 x 1.4 cm T2
hyperintense collection with a T2 dark rim, which demonstrates
heterogeneously increased T1 intrinsic internal T1 signal and a
hyperintense T1 nodular rim. There is no evidence of suspicious
postcontrast enhancement associated with this lesion. Additionally,
there is no suspicious enhancing soft tissue nodularity

Other:  No pelvic ascites.

Musculoskeletal: Heterogeneous marrow signal without discrete
suspicious bone lesions identified.
IMPRESSION: Postsurgical change of left transinguinal orchiectomy with a 1.8 cm
collection in the left hemiscrotum, which may reflect a
postoperative seroma/hematoma or lymphocele.

No evidence of suspicious postcontrast enhancement in the surgical
bed to suggest local recurrence.

## 2020-09-08 MED ORDER — GADOBENATE DIMEGLUMINE 529 MG/ML IV SOLN
18.0000 mL | Freq: Once | INTRAVENOUS | Status: AC | PRN
  Administered 2020-09-08: 18 mL via INTRAVENOUS

## 2020-09-09 DIAGNOSIS — Z951 Presence of aortocoronary bypass graft: Secondary | ICD-10-CM

## 2020-09-09 NOTE — Progress Notes (Unsigned)
Cardiac Individual Treatment Plan  Patient Details  Name: Roy [name] MRN: [id number] Date of Birth: 08/31/1945 Referring Provider:   Flowsheet Row Cardiac Rehab from 05/04/2020 in [facility] Cardiac and Pulmonary Rehab  Referring Provider [name] MD       Initial Encounter Date:  Flowsheet Row Cardiac Rehab from 05/04/2020 in [facility] Cardiac and Pulmonary Rehab  Date 05/04/20       Visit Diagnosis: S/P CABG x 4  Patient's Home Medications on Admission:  Current Outpatient Medications:    acetaminophen (TYLENOL) 325 MG tablet, Take 2 tablets (650 mg total) by mouth every 4 (four) hours as needed for headache or mild pain., Disp: , Rfl:    Apoaequorin (PREVAGEN PO), Take 1 tablet by mouth at bedtime., Disp: , Rfl:    aspirin EC 81 MG tablet, Take 81 mg by mouth daily. Swallow whole., Disp: , Rfl:    atorvastatin (LIPITOR) 40 MG tablet, Take 1 tablet (40 mg total) by mouth daily., Disp: 90 tablet, Rfl: 3   cholecalciferol (VITAMIN D) 1000 UNITS tablet, Take 1,000 Units by mouth See admin instructions. Mon-friday, Disp: , Rfl:    empagliflozin (JARDIANCE) 10 MG TABS tablet, Take 1 tablet (10 mg total) by mouth daily before breakfast., Disp: 90 tablet, Rfl: 1   furosemide (LASIX) 80 MG tablet, Take 0.5 tablets (40 mg total) by mouth daily., Disp: 30 tablet, Rfl: 11   glucosamine-chondroitin 500-400 MG tablet, Take 1 tablet by mouth. 5 times weekly, Disp: , Rfl:    LORazepam (ATIVAN) 0.5 MG tablet, Take 1 tablet (0.5 mg total) by mouth every 6 (six) hours as needed for anxiety. Put 1 pill under your tongue every 6 hrs, if needed, for nausea, Disp: 30 tablet, Rfl: 0   metFORMIN (GLUCOPHAGE) 1000 MG tablet, Take 1,000 mg by mouth 2 (two) times daily with a meal. , Disp: , Rfl:    metoprolol (TOPROL XL) 200 MG 24 hr tablet, Take 1 tablet (200 mg total) by mouth daily., Disp: 90 tablet, Rfl: 3   Multiple Vitamin (MULTIVITAMIN WITH MINERALS) TABS tablet, Take 1 tablet by mouth. 5 times weekly,  Disp: , Rfl:    omeprazole (PRILOSEC) 20 MG capsule, Take 20 mg by mouth daily., Disp: , Rfl:    ondansetron (ZOFRAN) 8 MG tablet, Take 1 tablet (8 mg total) by mouth every 8 (eight) hours as needed for nausea or vomiting. Take 1 pill every 8 hrs for 3 days., Disp: 30 tablet, Rfl: 3   prochlorperazine (COMPAZINE) 10 MG tablet, Take 1 tablet (10 mg total) by mouth every 8 (eight) hours as needed for nausea or vomiting., Disp: 40 tablet, Rfl: 2   sacubitril-valsartan (ENTRESTO) 24-26 MG, Take 1 tablet by mouth 2 (two) times daily., Disp: 60 tablet, Rfl: 3   spironolactone (ALDACTONE) 25 MG tablet, Take 0.5 tablets (12.5 mg total) by mouth daily., Disp: 60 tablet, Rfl: 1   tamsulosin (FLOMAX) 0.4 MG CAPS capsule, Take 0.4 mg by mouth at bedtime., Disp: , Rfl:    vitamin B-12 (CYANOCOBALAMIN) 1000 MCG tablet, Take 1,000 mcg by mouth. 5 times a week, Disp: , Rfl:   Current Facility-Administered Medications:    potassium chloride (KLOR-CON) CR tablet 20 mEq, 20 mEq, Oral, BID, [name], Glenice Bow, MD  Past Medical History: Past Medical History:  Diagnosis Date   Allergic rhinitis    Arthritis    wrists   Cardiomyopathy, nonischemic [facility])    followed by cardiology--- dr [name]---  2016 ef 45-50% ,  2016  nuclear ef 37%,  2017 per echo ef 50-55%   CHF (congestive heart failure), NYHA class III ([place]) 03/21/2020   Coronary artery disease    GERD (gastroesophageal reflux disease)    Hiatal hernia    History of kidney stones    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;   01/ 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   History of syncope (03-06-2020 pt stated has not had sycopal episode in few years, stated it seems to happen in extreme hot conditions)   cardiologist--- dr [name]--- dx recurrent syncope;  nuclear study 11-03-2014 intermediate risk w/ no ischemia, apical hypokinesis, nuclear ef 37%;  event monitor-- 12-21-2015 SB/ ST  no pauses/ arrythmia's;  echo 08-03-2015  ef 50-55%   Hyperlipidemia    Hypertension    followed by pcp   Hypovitaminosis D    Mass of left testicle    Nocturia    Plantar fasciitis    Presence of surgical incision    01/ 2022  moh's w/ skin graft of nose, per pt still healing and wear bandage daily   Type 2 diabetes mellitus ([place])    pt is adament that he is not and have been told he is a diabetic but a borderline;  followed by pcp, in pcp note states DM2 and takes 2 meds daily   Wears glasses    Wears hearing aid in both ears     Tobacco Use: Social History   Tobacco Use  Smoking Status Former   Types: Pipe   Quit date: 11/29/1976   Years since quitting: 43.8  Smokeless Tobacco Never    Labs: Recent Review Flowsheet Data     Labs for ITP Cardiac and Pulmonary Rehab Latest Ref Rng & Units 03/28/2020 03/29/2020 03/30/2020 03/30/2020 07/03/2020   Cholestrol 100 - 199 mg/dL - - - - 128   LDLCALC 0 - 99 mg/dL - - - - 65   HDL >39 mg/dL - - - - 41   Trlycerides 0 - 149 mg/dL - - - - 124   Hemoglobin A1c 4.8 - 5.6 % - - - - -   PHART 7.350 - 7.450 - - - - -   PCO2ART 32.0 - 48.0 mmHg - - - - -   HCO3 20.0 - 28.0 mmol/L - - - - -   TCO2 22 - 32 mmol/L - - - - -   ACIDBASEDEF 0.0 - 2.0 mmol/L - - - - -   O2SAT % 61.4 60.3 59.8 56.5 -        Exercise Target Goals: Exercise Program Goal: Individual exercise prescription set using results from initial 6 min walk test and THRR while considering  patient's activity barriers and safety.   Exercise Prescription Goal: Initial exercise prescription builds to 30-45 minutes a day of aerobic activity, 2-3 days per week.  Home exercise guidelines will be given to patient during program as part of exercise prescription that the participant will acknowledge.   Education: Aerobic Exercise: - Group verbal and visual presentation on the components of exercise prescription. Introduces F.I.T.T principle from ACSM for exercise prescriptions.  Reviews F.I.T.T. principles of aerobic exercise  including progression. Written material given at graduation.   Education: Resistance Exercise: - Group verbal and visual presentation on the components of exercise prescription. Introduces F.I.T.T principle from ACSM for exercise prescriptions  Reviews F.I.T.T. principles of resistance exercise including progression. Written material given at graduation. Flowsheet Row  Cardiac Rehab from 09/02/2020 in [facility] Cardiac and Pulmonary Rehab  Date 07/22/20  Educator [place]  Instruction Review Code 1- Verbalizes Understanding        Education: Exercise & Equipment Safety: - Individual verbal instruction and demonstration of equipment use and safety with use of the equipment. Flowsheet Row Cardiac Rehab from 09/02/2020 in [facility] Cardiac and Pulmonary Rehab  Date 04/30/20  Educator [facility]  Instruction Review Code 1- Verbalizes Understanding       Education: Exercise Physiology & General Exercise Guidelines: - Group verbal and written instruction with models to review the exercise physiology of the cardiovascular system and associated critical values. Provides general exercise guidelines with specific guidelines to those with heart or lung disease.  Flowsheet Row Cardiac Rehab from 09/02/2020 in [facility] Cardiac and Pulmonary Rehab  Date 07/08/20  Educator [place]  Instruction Review Code 1- Verbalizes Understanding       Education: Flexibility, Balance, Mind/Body Relaxation: - Group verbal and visual presentation with interactive activity on the components of exercise prescription. Introduces F.I.T.T principle from ACSM for exercise prescriptions. Reviews F.I.T.T. principles of flexibility and balance exercise training including progression. Also discusses the mind body connection.  Reviews various relaxation techniques to help reduce and manage stress (i.e. Deep breathing, progressive muscle relaxation, and visualization). Balance handout provided to take home. Written material given at graduation. Flowsheet Row  Cardiac Rehab from 09/02/2020 in [facility] Cardiac and Pulmonary Rehab  Date 07/29/20  Educator [facility]  Instruction Review Code 1- Verbalizes Understanding       Activity Barriers & Risk Stratification:  Activity Barriers & Cardiac Risk Stratification - 05/04/20 1237       Activity Barriers & Cardiac Risk Stratification   Activity Barriers Shortness of Breath;Decreased Ventricular Function;Deconditioning;Muscular Weakness;Other (comment)    Comments Right shoulder- limited ROM due to rotator cuff surgery and bicep tear    Cardiac Risk Stratification High             6 Minute Walk:  6 Minute Walk     Row Name 05/04/20 1245 09/02/20 1441       6 Minute Walk   Phase Initial Discharge    Distance 1000 feet 1065 feet    Distance % Change -- 6.7 %    Distance Feet Change -- 65 ft    Walk Time 6 minutes 6 minutes    # of Rest Breaks 0 0    MPH 1.89 2.01    METS 2.19 2.27    RPE 11 11    Perceived Dyspnea  1 1    VO2 Peak 7.69 7.95    Symptoms No No    Resting HR 98 bpm 102 bpm    Resting BP 112/68 124/70    Resting Oxygen Saturation  99 % 98 %    Exercise Oxygen Saturation  during 6 min walk 99 % 95 %    Max Ex. HR 116 bpm 117 bpm    Max Ex. BP 122/66 118/68    2 Minute Post BP 110/68 --             Oxygen Initial Assessment:   Oxygen Re-Evaluation:   Oxygen Discharge (Final Oxygen Re-Evaluation):   Initial Exercise Prescription:  Initial Exercise Prescription - 05/04/20 1200       Date of Initial Exercise RX and Referring Provider   Date 05/04/20    Referring Provider [name] MD      Treadmill   MPH 1.7    Grade 0.5  Minutes 15    METs 2.42      Recumbant Bike   Level 2    RPM 60    Watts 10    Minutes 15    METs 2.1      NuStep   Level 2    SPM 80    Minutes 15    METs 2.1      T5 Nustep   Level 1    SPM 80    Minutes 15    METs 2.1      Prescription Details   Frequency (times per week) 2    Duration Progress to 30 minutes  of continuous aerobic without signs/symptoms of physical distress      Intensity   THRR 40-80% of Max Heartrate 116-134    Ratings of Perceived Exertion 11-13    Perceived Dyspnea 0-4      Progression   Progression Continue to progress workloads to maintain intensity without signs/symptoms of physical distress.      Resistance Training   Training Prescription Yes    Weight 3 lb    Reps 10-15             Perform Capillary Blood Glucose checks as needed.  Exercise Prescription Changes:   Exercise Prescription Changes     Row Name 05/04/20 1200 05/20/20 1500 06/02/20 1300 06/08/20 1400 06/17/20 0800     Response to Exercise   Blood Pressure (Admit) 112/68 116/64 104/64 -- 94/60   Blood Pressure (Exercise) 122/66 124/60 142/70 -- 110/64   Blood Pressure (Exit) 110/68 104/62 90/62 -- 98/60   Heart Rate (Admit) 98 bpm 74 bpm 100 bpm -- 93 bpm   Heart Rate (Exercise) 116 bpm 111 bpm 112 bpm -- 109 bpm   Heart Rate (Exit) 99 bpm 100 bpm 104 bpm -- 100 bpm   Oxygen Saturation (Admit) 99 % -- -- -- --   Oxygen Saturation (Exercise) 99 % -- -- -- --   Oxygen Saturation (Exit) 99 % -- -- -- --   Rating of Perceived Exertion (Exercise) _0 -- 11   Perceived Dyspnea (Exercise) 1 -- -- -- --   Symptoms none none -- -- none   Comments walk test results third full day of exercise -- -- --   Duration -- Continue with 30 min of aerobic exercise without signs/symptoms of physical distress. Continue with 30 min of aerobic exercise without signs/symptoms of physical distress. -- Continue with 30 min of aerobic exercise without signs/symptoms of physical distress.   Intensity -- THRR unchanged THRR unchanged -- THRR unchanged     Progression   Progression -- Continue to progress workloads to maintain intensity without signs/symptoms of physical distress. Continue to progress workloads to maintain intensity without signs/symptoms of physical distress. -- Continue to progress workloads to  maintain intensity without signs/symptoms of physical distress.   Average METs -- 1.93 2.16 -- 2.57     Resistance Training   Training Prescription -- Yes Yes -- Yes   Weight -- 3 lb 3 lb -- 3 lb   Reps -- 10-15 10-15 -- 10-15     Interval Training   Interval Training -- No No -- No     Treadmill   MPH -- 0.7 1.1 -- 1.3   Grade -- 0 0.5 -- 0   Minutes -- 15 15 -- 15   METs -- 1.5 1.92 -- 2     Recumbant Bike   Level -- 2 1 -- 1  Watts -- -- -- -- 16   Minutes -- 15 15 -- 15   METs -- 2 2.3 -- 2.52     NuStep   Level -- 2 -- -- --   Minutes -- 15 -- -- --   METs -- 2.2 -- -- --     REL-XR   Level -- -- -- -- 1   Minutes -- -- -- -- 15   METs -- -- -- -- 3.2     T5 Nustep   Level -- 1 -- -- --   Minutes -- 15 -- -- --   METs -- 2 -- -- --     Home Exercise Plan   Plans to continue exercise at -- -- -- Home (comment)  walking, weights, staff videos Home (comment)  walking, weights, staff videos   Frequency -- -- -- Add 2 additional days to program exercise sessions. Add 2 additional days to program exercise sessions.   Initial Home Exercises Provided -- -- -- 06/08/20 06/08/20    Row Name 06/30/20 1300 07/14/20 1500 07/30/20 0900 08/12/20 1500 08/25/20 1400     Response to Exercise   Blood Pressure (Admit) 122/62 104/64 126/64 110/60 108/62   Blood Pressure (Exercise) 124/70 148/60 -- -- --   Blood Pressure (Exit) 122/64 98/60 102/60 100/58 98/60   Heart Rate (Admit) 92 bpm 97 bpm 110 bpm 106 bpm 107 bpm   Heart Rate (Exercise) 108 bpm 117 bpm 128 bpm 100 bpm 108 bpm   Heart Rate (Exit) 92 bpm 99 bpm 104 bpm 102 bpm 109 bpm   Oxygen Saturation (Admit) -- -- -- 98 % 95 %   Oxygen Saturation (Exercise) -- -- -- 97 % 87 %   Oxygen Saturation (Exit) -- -- -- 96 % 98 %   Rating of Perceived Exertion (Exercise) _0 Symptoms _1    Duration Continue with 30 min of aerobic exercise without signs/symptoms of physical distress. Continue  with 30 min of aerobic exercise without signs/symptoms of physical distress. Continue with 30 min of aerobic exercise without signs/symptoms of physical distress. Continue with 30 min of aerobic exercise without signs/symptoms of physical distress. Continue with 30 min of aerobic exercise without signs/symptoms of physical distress.   Intensity _2      Progression   Progression Continue to progress workloads to maintain intensity without signs/symptoms of physical distress. Continue to progress workloads to maintain intensity without signs/symptoms of physical distress. Continue to progress workloads to maintain intensity without signs/symptoms of physical distress. Continue to progress workloads to maintain intensity without signs/symptoms of physical distress. Continue to progress workloads to maintain intensity without signs/symptoms of physical distress.   Average METs 2.2 2.44 2.31 1.77 2.37     Resistance Training   Training Prescription _3    Weight 3 lb 3 lb 5 lb 3 lb 3 lb   Reps 10-15 10-15 10-15 10-15 10-15     Interval Training   Interval Training _4      Treadmill   MPH 1.3 1.3 1.6 1 1.1   Grade 0 0 0 0 --   Minutes _5 METs 2 2 2.23 1.77 1.84     Recumbant Bike   Level -- -- -- -- 2   Minutes -- -- -- -- 15   METs -- -- -- -- 2.91  REL-XR   Level _0 -- --   Minutes _1 -- --   METs -- 2.9 2.3 -- --     Home Exercise Plan   Plans to continue exercise at Home (comment)  walking, weights, staff videos Home (comment)  walking, weights, staff videos Home (comment)  walking, weights, staff videos Home (comment)  walking, weights, staff videos Home (comment)  walking, weights, staff videos   Frequency Add 2 additional days to program exercise sessions. Add 2 additional days to program exercise sessions. Add 2 additional days to program exercise sessions. Add  2 additional days to program exercise sessions. Add 2 additional days to program exercise sessions.   Initial Home Exercises Provided 06/08/20 06/08/20 06/08/20 06/08/20 06/08/20            Exercise Comments:   Exercise Comments     Row Name 08/31/20 1423           Exercise Comments Pt able to follow exercise prescription today without complaint.  Discussed weight gain of approx. 12 lbs since his last visit on 7/27. Pt stated he has been in the bed for about a week due to a reaction with chemo medication. Pt stated no difficulty breathing and was feeling well. Will continue to monitor for progression.                Exercise Goals and Review:   Exercise Goals     Row Name 05/04/20 1304             Exercise Goals   Increase Physical Activity Yes       Intervention Provide advice, education, support and counseling about physical activity/exercise needs.;Develop an individualized exercise prescription for aerobic and resistive training based on initial evaluation findings, risk stratification, comorbidities and participant's personal goals.       Expected Outcomes Short Term: Attend rehab on a regular basis to increase amount of physical activity.;Long Term: Exercising regularly at least 3-5 days a week.;Long Term: Add in home exercise to make exercise part of routine and to increase amount of physical activity.       Increase Strength and Stamina Yes       Intervention Provide advice, education, support and counseling about physical activity/exercise needs.;Develop an individualized exercise prescription for aerobic and resistive training based on initial evaluation findings, risk stratification, comorbidities and participant's personal goals.       Expected Outcomes Short Term: Increase workloads from initial exercise prescription for resistance, speed, and METs.;Short Term: Perform resistance training exercises routinely during rehab and add in resistance training at  home;Long Term: Improve cardiorespiratory fitness, muscular endurance and strength as measured by increased METs and functional capacity (6MWT)       Able to understand and use rate of perceived exertion (RPE) scale Yes       Intervention Provide education and explanation on how to use RPE scale       Expected Outcomes Short Term: Able to use RPE daily in rehab to express subjective intensity level;Long Term:  Able to use RPE to guide intensity level when exercising independently       Able to understand and use Dyspnea scale Yes       Intervention Provide education and explanation on how to use Dyspnea scale       Expected Outcomes Short Term: Able to use Dyspnea scale daily in rehab to express subjective sense of shortness of breath during exertion;Long Term: Able to use Dyspnea scale to  guide intensity level when exercising independently       Knowledge and understanding of Target Heart Rate Range (THRR) Yes       Intervention Provide education and explanation of THRR including how the numbers were predicted and where they are located for reference       Expected Outcomes Short Term: Able to state/look up THRR;Short Term: Able to use daily as guideline for intensity in rehab;Long Term: Able to use THRR to govern intensity when exercising independently       Able to check pulse independently Yes       Intervention Provide education and demonstration on how to check pulse in carotid and radial arteries.;Review the importance of being able to check your own pulse for safety during independent exercise       Expected Outcomes Short Term: Able to explain why pulse checking is important during independent exercise;Long Term: Able to check pulse independently and accurately       Understanding of Exercise Prescription Yes       Intervention Provide education, explanation, and written materials on patient's individual exercise prescription       Expected Outcomes Short Term: Able to explain program  exercise prescription;Long Term: Able to explain home exercise prescription to exercise independently                Exercise Goals Re-Evaluation :  Exercise Goals Re-Evaluation     Row Name 05/14/20 1407 05/20/20 1553 06/02/20 1332 06/08/20 1406 06/17/20 0757     Exercise Goal Re-Evaluation   Exercise Goals Review Increase Physical Activity;Able to understand and use rate of perceived exertion (RPE) scale;Knowledge and understanding of Target Heart Rate Range (THRR);Understanding of Exercise Prescription;Increase Strength and Stamina;Able to check pulse independently Increase Physical Activity;Increase Strength and Stamina;Understanding of Exercise Prescription Increase Physical Activity;Increase Strength and Stamina Increase Physical Activity;Increase Strength and Stamina;Understanding of Exercise Prescription Increase Physical Activity;Increase Strength and Stamina;Understanding of Exercise Prescription   Comments Reviewed RPE and dyspnea scales, THR and program prescription with pt today.  Pt voiced understanding and was given a copy of goals to take home. Bourke is off to a good start in rehab.  He has completed his first three full days of exercise.  We will continue to monitor his progress. [place] has increased to 1.1 mph on TM.  He is close to target HR range.  Staff will monitor progress. Newman is doing well in rehab.  He is feeling like his strength and stamina are recoving.  He does not get as tired as quickly.  I he was able to take trash out last week without stopping!!  Reviewed home exercise with pt today.  Pt plans to walk and use staff videos at home for exercise.  Reviewed THR, pulse, RPE, sign and symptoms, pulse oximetery and when to call 911 or MD.  Also discussed weather considerations and indoor options.  Pt voiced understanding. Ogren has been doing well in rehab.  He would benefit from improved attendance as his progression is lacking.  He was down to 1.3 mph on treadmill at last  session.  We will continue to monitor his progress.   Expected Outcomes Short: Use RPE daily to regulate intensity. Long: Follow program prescription in THR. Short: Continue to attend rehab regularly Long: Continue to follow program prescription Short: work in [employer] range Long: contineu to build stamina Short: start to add in more walk and staff videos at home Long: Continue to build stamina Short: Improved attendance Long: Continue  to improve stamina    Row Name 06/24/20 1427 06/30/20 1316 07/14/20 1544 07/29/20 1440 07/30/20 0904     Exercise Goal Re-Evaluation   Exercise Goals Review Increase Physical Activity;Increase Strength and Stamina Increase Physical Activity;Increase Strength and Stamina Increase Physical Activity;Increase Strength and Stamina;Understanding of Exercise Prescription Understanding of Exercise Prescription Increase Physical Activity;Increase Strength and Stamina   Comments Crocket is walking on days not at [facility].  He is still not lifting too heavy but does some yard work.  He has noticed he doesnt have to stop as much walking down driveway.  He would have to stop both ways before he was exercising.  He also walked in from patient parking instead of using valet. Rinkenberger missed some sessions in May due to other health issues.  He is getting back on track with attendance. Lavine continues to have appointments for chemo treatments which inhibit him from attending rehab regularly.  We will continue to encourge improved attendance and increase workloads.  We will continue to monitor his progress. Devera wants to be able to walk outside if weather is permitting. Informed him about the [employer] down the hall and is taking it into consideration. Gave patient information pamphlet on the [facility]. Altschuler is doing well. He has built up his tolerance on the treadmill to a 1.6 speed and 5 lbs for handweights. Will continue to monitor.   Expected Outcomes Short: maintain consistent exercise Long: continue to buils  stamina SHort: attend at least twice per week Long: improve overall stamina Short: Consistent attendance Long: improve stamina Short: check to see if he has [place] and fit. Long: maintain a workout routine independently. Short: Continue exercise prescription Long: Continue to build up overall strength and stamina    Row Name 08/12/20 1520 08/19/20 1407 08/25/20 1418         Exercise Goal Re-Evaluation   Exercise Goals Review Increase Physical Activity;Increase Strength and Stamina;Understanding of Exercise Prescription Increase Physical Activity;Increase Strength and Stamina;Understanding of Exercise Prescription Increase Physical Activity;Increase Strength and Stamina     Comments Bas is doing well in rehab.  He is getting in 30 min on the treadmill and was encouraged to use other equipment when there are options open.  His attendance has been spotty with his cancer treatments.  We will continue to monitor his progress. Dattilo is doing well. He is walking outside for about 10 minutes at a time two times per day. We discussed trying to increase his time of walking to increase a minimum of 30 minutes at one time. We reviewed weather conditions outside and be minful not to go outside when it is too hot. He received out Youtube channel to look at for other exercises. He is using resistance bands almost every day working all major muscle groups. Wafer has not been attending consistently during the month of July.  Staff will encourage regular attendance and remind him of benefits of exercise     Expected Outcomes Short: Continue to improve stamina and improve attendance Long: Conitnue to improve stamina Short: Increase duration for exercise Long: Continue to exercise independently at appropriate prescription Short: attend consistently Long:  build overall stamina              Discharge Exercise Prescription (Final Exercise Prescription Changes):  Exercise Prescription Changes - 08/25/20 1400        Response to Exercise   Blood Pressure (Admit) 108/62    Blood Pressure (Exit) 98/60    Heart Rate (Admit) 107 bpm  Heart Rate (Exercise) 108 bpm    Heart Rate (Exit) 109 bpm    Oxygen Saturation (Admit) 95 %    Oxygen Saturation (Exercise) 87 %    Oxygen Saturation (Exit) 98 %    Rating of Perceived Exertion (Exercise) 11    Symptoms none    Duration Continue with 30 min of aerobic exercise without signs/symptoms of physical distress.    Intensity THRR unchanged      Progression   Progression Continue to progress workloads to maintain intensity without signs/symptoms of physical distress.    Average METs 2.37      Resistance Training   Training Prescription Yes    Weight 3 lb    Reps 10-15      Interval Training   Interval Training No      Treadmill   MPH 1.1    Minutes 15    METs 1.84      Recumbant Bike   Level 2    Minutes 15    METs 2.91      Home Exercise Plan   Plans to continue exercise at Home (comment)   walking, weights, staff videos   Frequency Add 2 additional days to program exercise sessions.    Initial Home Exercises Provided 06/08/20             Nutrition:  Target Goals: Understanding of nutrition guidelines, daily intake of sodium <1560m, cholesterol <2018m calories 30% from fat and 7% or less from saturated fats, daily to have 5 or more servings of fruits and vegetables.  Education: All About Nutrition: -Group instruction provided by verbal, written material, interactive activities, discussions, models, and posters to present general guidelines for heart healthy nutrition including fat, fiber, MyPlate, the role of sodium in heart healthy nutrition, utilization of the nutrition label, and utilization of this knowledge for meal planning. Follow up email sent as well. Written material given at graduation. [facility]  Date 06/03/20  [facility] 1-  Verbalizes Understanding       Biometrics:  Pre Biometrics - 05/04/20 1236       Pre Biometrics   Height 6' (1.829 m)    Weight 203 lb 8 oz (92.3 kg)    BMI (Calculated) 27.59    Single Leg Stand 15.9 seconds             Post Biometrics - 09/02/20 1443        Post  Biometrics   Height 6' (1.829 m)    Weight 205 lb (93 kg)    BMI (Calculated) 27.8             Nutrition Therapy Plan and Nutrition Goals:  Nutrition Therapy & Goals - 05/19/20 1002       Nutrition Therapy   Diet Heart healthy, low Na, Diabetes friendly    Protein (specify units) 95-110g (active cancer - not in treatment yet)    Fiber 30 grams    Whole Grain Foods 3 servings    Saturated Fats 12 max. grams    Fruits and Vegetables 8 servings/day    Sodium 1.5 grams      Personal Nutrition Goals   Nutrition Goal ST: Increase fruit/vegetable intake to 5 servings per day (eat a rainbow per week), limit processed meat to <2x/month, mix brown rice with white rice to see how he enjoys it. LT: meet nutritional needs going through treatment, make sure RD is  part of care team    Comments He has active Cancer, but needs to talk with cardiologist regarding his heart status and cancer treatment. The oncologists say he needs to begin treatment soon. Encouraged to ensure RD is a part of his treatment team. He is honoring his hunger cues and eating until satisfied, but not uncomfortably full. He is no longer eating red meat every day. He eats mostly chicken, fish, shrimp, and pork chops. He likes to eat many green vegetables which he will steam and he enjoys fruit - 3 servings per day normally (2 fruits and a vegetable; canned fruit typically -in own juice). He uses mostly olive oil, if eating eggs will use bacon fat - eats processed meat like bacon 2x/month. Does not eat much bread, but he enjoys potatoes. A1C: 6.7. He does not take his BG at home and does not have a monitor at home. B: fruit cocktail or oatmeal or  griits with dried fruit or a pad of butter; this morning he had an english muffin (regular) with strawberry jelly, butter, and a salmon patty with coffee (2 splenda and half and half) and apple juice for his medications. S:2 graham cracker with PB and 2% milk or white bread and [place] sandwich D: pork chop with mac and cheese and fried apples and iced tea (sweet) tonight. He will occassionally have shot of liquor or wine. Drinks: water and occassionally pepsi, coke, dr pepper, 7-up. He reports liking whole wheat bread, but does not like it every day. Discussed heart healthy eating, diabetes friendly eating, and eating during cancer treatment (discussed possible barriers and increased needs).      Intervention Plan   Intervention Prescribe, educate and counsel regarding individualized specific dietary modifications aiming towards targeted core components such as weight, hypertension, lipid management, diabetes, heart failure and other comorbidities.;Nutrition handout(s) given to patient.    Expected Outcomes Short Term Goal: Understand basic principles of dietary content, such as calories, fat, sodium, cholesterol and nutrients.;Short Term Goal: A plan has been developed with personal nutrition goals set during dietitian appointment.;Long Term Goal: Adherence to prescribed nutrition plan.             Nutrition Assessments:  MEDIFICTS Score Key: ?70 Need to make dietary changes  40-70 Heart Healthy Diet ? 40 Therapeutic Level Cholesterol Diet  Flowsheet Row Documentation from 09/09/2020 in [facility] Cardiac and Pulmonary Rehab  Picture Your Plate Total Score on Admission 62  Picture Your Plate Total Score on Discharge 53      Picture Your Plate Scores: <82 Unhealthy dietary pattern with much room for improvement. 41-50 Dietary pattern unlikely to meet recommendations for good health and room for improvement. 51-60 More healthful dietary pattern, with some room for improvement.  >60 Healthy  dietary pattern, although there may be some specific behaviors that could be improved.    Nutrition Goals Re-Evaluation:  Nutrition Goals Re-Evaluation     [place] Name 06/08/20 1409 06/24/20 1430 07/29/20 1444 08/19/20 1412       Goals   Nutrition Goal ST: Increase fruit/vegetable intake to 5 servings per day (eat a rainbow per week), limit processed meat to <2x/month, mix brown rice with white rice to see how he enjoys it. LT: meet nutritional needs going through treatment, make sure RD is part of care team -- add products that have potassium to his diet. Continue to follow diabetic friendly diet    Comment He met with Lenna Sciara recently and has already backed off his portion sizes.  He is eating more fruit than ever before.  He is trying to get a good variety.  He is sticking with 2-21/2 meals a day.  Usually breakfast, snack, and dinner. Harr reports eating 2 good meals and a snack each day.  He watches what he eats and not as much red meat.  He is eating fresh and frozen vegetables.  He has also increased fruit - avoiding grapefruit due to medication interaction.  He eats a meat with pasta salad and a fruit for dinner. He states that his potassium has been low in the past and is working on more intake. Honor is doing well. His appetite is better and he tries to eats 4 times a day, typically smaller meals. Still reserving to leaner meats such as fish and chicken and limiting read meat. He has dropped 4 lbs in the last 2 weeks and is pleased, lost a total of 25 lbs before surgey. HE is still trying to eat more potassium.    Expected Outcome Short: Continue twith changes Long: COnitnue to focus on healthy eating. ST/LT:  continue with current plan Short: eat more foods with potassium. Long: maintain potassium levels. Short: Continue checking weight to maintain Long: Continue to heart healthy diet             Nutrition Goals Discharge (Final Nutrition Goals Re-Evaluation):  Nutrition Goals  Re-Evaluation - 08/19/20 1412       Goals   Nutrition Goal Continue to follow diabetic friendly diet    Comment Toops is doing well. His appetite is better and he tries to eats 4 times a day, typically smaller meals. Still reserving to leaner meats such as fish and chicken and limiting read meat. He has dropped 4 lbs in the last 2 weeks and is pleased, lost a total of 25 lbs before surgey. HE is still trying to eat more potassium.    Expected Outcome Short: Continue checking weight to maintain Long: Continue to heart healthy diet             Psychosocial: Target Goals: Acknowledge presence or absence of significant depression and/or stress, maximize coping skills, provide positive support system. Participant is able to verbalize types and ability to use techniques and skills needed for reducing stress and depression.   Education: Stress, Anxiety, and Depression - Group verbal and visual presentation to define topics covered.  Reviews how body is impacted by stress, anxiety, and depression.  Also discusses healthy ways to reduce stress and to treat/manage anxiety and depression.  Written material given at graduation. Flowsheet Row Cardiac Rehab from 09/02/2020 in [facility] Cardiac and Pulmonary Rehab  Date 09/02/20  Educator AS  Instruction Review Code 1- Verbalizes Understanding       Education: Sleep Hygiene -Provides group verbal and written instruction about how sleep can affect your health.  Define sleep hygiene, discuss sleep cycles and impact of sleep habits. Review good sleep hygiene tips.    Initial Review & Psychosocial Screening:  Initial Psych Review & Screening - 04/30/20 0913       Initial Review   Current issues with None Identified      Family Dynamics   Good Support System? Yes    Comments He can look to his wife  for support. Walling has a positive outlook on his health.      Barriers   Psychosocial barriers to participate in program There are no identifiable  barriers or psychosocial needs.;The patient should benefit from training in stress management  and relaxation.      Screening Interventions   Interventions To provide support and resources with identified psychosocial needs;Provide feedback about the scores to participant;Encouraged to exercise    Expected Outcomes Short Term goal: Utilizing psychosocial counselor, staff and physician to assist with identification of specific Stressors or current issues interfering with healing process. Setting desired goal for each stressor or current issue identified.;Long Term Goal: Stressors or current issues are controlled or eliminated.;Short Term goal: Identification and review with participant of any Quality of Life or Depression concerns found by scoring the questionnaire.;Long Term goal: The participant improves quality of Life and PHQ9 Scores as seen by post scores and/or verbalization of changes             Quality of Life Scores:   Quality of Life - 09/09/20 1447       Quality of Life Scores   Health/Function Pre 20.07 %    Health/Function Post 21.47 %    Health/Function % Change 6.98 %    Socioeconomic Pre 25.57 %    Socioeconomic Post 25.5 %    Socioeconomic % Change  -0.27 %    Psych/Spiritual Pre 30 %    Psych/Spiritual Post 28.79 %    Psych/Spiritual % Change -4.03 %    Family Pre 27 %    Family Post 28.8 %    Family % Change 6.67 %    GLOBAL Pre 24.31 %    GLOBAL Post 24.9 %    GLOBAL % Change 2.43 %            Scores of 19 and below usually indicate a poorer quality of life in these areas.  A difference of  2-3 points is a clinically meaningful difference.  A difference of 2-3 points in the total score of the Quality of Life Index has been associated with significant improvement in overall quality of life, self-image, physical symptoms, and general health in studies assessing change in quality of life.  PHQ-9: Recent Review Flowsheet Data     Depression screen [facility] 2/9  09/09/2020 05/04/2020   Decreased Interest 1 0   Down, Depressed, Hopeless 0 0   PHQ - 2 Score 1 0   Altered sleeping 1 2   Tired, decreased energy 1 1   Change in appetite 0 1   Feeling bad or failure about yourself  0 0   Trouble concentrating 0 0   Moving slowly or fidgety/restless 0 0   Suicidal thoughts 0 0   PHQ-9 Score 3 4   Difficult doing work/chores Not difficult at all Not difficult at all      Interpretation of Total Score  Total Score Depression Severity:  1-4 = Minimal depression, 5-9 = Mild depression, 10-14 = Moderate depression, 15-19 = Moderately severe depression, 20-27 = Severe depression   Psychosocial Evaluation and Intervention:  Psychosocial Evaluation - 04/30/20 0914       Psychosocial Evaluation & Interventions   Interventions Encouraged to exercise with the program and follow exercise prescription;Stress management education;Relaxation education    Comments He can look to his wife for support. Fitzsimmons has a positive outlook on his health.    Expected Outcomes Short: Exercise regularly to support mental health and notify staff of any changes. Long: maintain mental health and well being through teaching of rehab or prescribed medications independently.    Continue Psychosocial Services  Follow up required by staff             Psychosocial  Re-Evaluation:  Psychosocial Re-Evaluation     Row Name 06/08/20 1407 06/24/20 1432 07/29/20 1436 08/19/20 1416       Psychosocial Re-Evaluation   Current issues with Current Stress Concerns -- None Identified Current Stress Concerns    Comments Last night was a rough sleep night.  However, he usually gets 7-9 hours.  Today, he feeling off his game and out of sorts with blurry vision upon standing. Mentally he is doing well.  Other than his health he has no major stressors.  They went to a wedding over the weekend.  He is generally a positive and happy person. Housey gets 7.5-9.5 hours of sleep and feels well rested.   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  He will start this in next 20 days.  They are starting with small doses because of his heart.  He feels confident in the plan his Drs have for him. Patient reports no issues with their current mental states, sleep, stress, depression or anxiety. Will follow up with patient in a few weeks for any changes. His sleep has been a little better but he has to take lasix which can keep him up sometimes. Mccree has started his chemo and currently experiencing stress dealing with that. he is tolerating the best that he can. He is starting a new cycle next week and not sure on the side effects or how he is going to feel. His sleep is much better since his surgery. Denies other complaints.    Expected Outcomes Short: Continue to keep close eye on health Long: Conitnue to stay positive. Short: continue to work with Drs on treatment plan Long: continue to exercise to help manage stress Short: Continue to exercise regularly to support mental health and notify staff of any changes. Long: maintain mental health and well being through teaching of rehab or prescribed medications independently. Short: Continue attendance with rehab Long: Utilize exercise for stress management and maintain posititive attitude    Interventions Encouraged to attend Cardiac Rehabilitation for the exercise -- Encouraged to attend Cardiac Rehabilitation for the exercise Encouraged to attend Cardiac Rehabilitation for the exercise    Continue Psychosocial Services  Follow up required by staff -- Follow up required by staff Follow up required by staff             Psychosocial Discharge (Final Psychosocial Re-Evaluation):  Psychosocial Re-Evaluation - 08/19/20 1416       Psychosocial Re-Evaluation   Current issues with Current Stress Concerns    Comments Navarrete has started his chemo and currently experiencing stress dealing with that. he is tolerating the best that he can. He is starting a new cycle next  week and not sure on the side effects or how he is going to feel. His sleep is much better since his surgery. Denies other complaints.    Expected Outcomes Short: Continue attendance with rehab Long: Utilize exercise for stress management and maintain posititive attitude    Interventions Encouraged to attend Cardiac Rehabilitation for the exercise    Continue Psychosocial Services  Follow up required by staff             Vocational Rehabilitation: Provide vocational rehab assistance to qualifying candidates.   Vocational Rehab Evaluation & Intervention:   Education: Education Goals: Education classes will be provided on a variety of topics geared toward better understanding of heart health and risk factor modification. Participant will state understanding/return demonstration of topics presented as noted by education test scores.  Learning  Barriers/Preferences:  Learning Barriers/Preferences - 04/30/20 0910       Learning Barriers/Preferences   Learning Barriers None    Learning Preferences None             General Cardiac Education Topics:  AED/CPR: - Group verbal and written instruction with the use of models to demonstrate the basic use of the AED with the basic ABC's of resuscitation.   Anatomy and Cardiac Procedures: - Group verbal and visual presentation and models provide information about basic cardiac anatomy and function. Reviews the testing methods done to diagnose heart disease and the outcomes of the test results. Describes the treatment choices: Medical Management, Angioplasty, or Coronary Bypass Surgery for treating various heart conditions including Myocardial Infarction, Angina, Valve Disease, and Cardiac Arrhythmias.  Written material given at graduation. Flowsheet Row Cardiac Rehab from 09/02/2020 in [facility] Cardiac and Pulmonary Rehab  Date 07/22/20  Educator [facility]  Instruction Review Code 1- Verbalizes Understanding       Medication Safety: - Group  verbal and visual instruction to review commonly prescribed medications for heart and lung disease. Reviews the medication, class of the drug, and side effects. Includes the steps to properly store meds and maintain the prescription regimen.  Written material given at graduation. Flowsheet Row Cardiac Rehab from 09/02/2020 in [facility] Cardiac and Pulmonary Rehab  Date 06/10/20  Educator SB  Instruction Review Code 1- Verbalizes Understanding       Intimacy: - Group verbal instruction through game format to discuss how heart and lung disease can affect sexual intimacy. Written material given at graduation..   Know Your Numbers and Heart Failure: - Group verbal and visual instruction to discuss disease risk factors for cardiac and pulmonary disease and treatment options.  Reviews associated critical values for Overweight/Obesity, Hypertension, Cholesterol, and Diabetes.  Discusses basics of heart failure: signs/symptoms and treatments.  Introduces Heart Failure Zone chart for action plan for heart failure.  Written material given at graduation. Flowsheet Row Cardiac Rehab from 09/02/2020 in [facility] Cardiac and Pulmonary Rehab  Date 08/19/20  Educator KB  Instruction Review Code 1- Verbalizes Understanding       Infection Prevention: - Provides verbal and written material to individual with discussion of infection control including proper hand washing and proper equipment cleaning during exercise session. Flowsheet Row Cardiac Rehab from 09/02/2020 in [facility] Cardiac and Pulmonary Rehab  Date 04/30/20  Educator [facility]  Instruction Review Code 1- Verbalizes Understanding       Falls Prevention: - Provides verbal and written material to individual with discussion of falls prevention and safety. Flowsheet Row Cardiac Rehab from 09/02/2020 in [facility] Cardiac and Pulmonary Rehab  Date 04/30/20  Educator [facility]  Instruction Review Code 1- Verbalizes Understanding       Other: -Provides group and verbal  instruction on various topics (see comments)   Knowledge Questionnaire Score:  Knowledge Questionnaire Score - 09/09/20 1445       Knowledge Questionnaire Score   Pre Score 22/26: Angina, Nutrition, Exercise    Post Score 25/26             Core Components/Risk Factors/Patient Goals at Admission:  Personal Goals and Risk Factors at Admission - 05/04/20 1307       Core Components/Risk Factors/Patient Goals on Admission    Weight Management Yes;Weight Maintenance    Intervention Weight Management: Develop a combined nutrition and exercise program designed to reach desired caloric intake, while maintaining appropriate intake of nutrient and fiber, sodium and fats, and appropriate energy  expenditure required for the weight goal.;Weight Management: Provide education and appropriate resources to help participant work on and attain dietary goals.;Weight Management/Obesity: Establish reasonable short term and long term weight goals.    Admit Weight 203 lb (92.1 kg)    Goal Weight: Short Term 203 lb (92.1 kg)    Goal Weight: Long Term 203 lb (92.1 kg)    Expected Outcomes Short Term: Continue to assess and modify interventions until short term weight is achieved;Long Term: Adherence to nutrition and physical activity/exercise program aimed toward attainment of established weight goal;Weight Maintenance: Understanding of the daily nutrition guidelines, which includes 25-35% calories from fat, 7% or less cal from saturated fats, less than 219m cholesterol, less than 1.5gm of sodium, & 5 or more servings of fruits and vegetables daily;Understanding recommendations for meals to include 15-35% energy as protein, 25-35% energy from fat, 35-60% energy from carbohydrates, less than 2051mof dietary cholesterol, 20-35 gm of total fiber daily;Understanding of distribution of calorie intake throughout the day with the consumption of 4-5 meals/snacks    Diabetes Yes    Intervention Provide education about  signs/symptoms and action to take for hypo/hyperglycemia.;Provide education about proper nutrition, including hydration, and aerobic/resistive exercise prescription along with prescribed medications to achieve blood glucose in normal ranges: Fasting glucose 65-99 mg/dL    Expected Outcomes Short Term: Participant verbalizes understanding of the signs/symptoms and immediate care of hyper/hypoglycemia, proper foot care and importance of medication, aerobic/resistive exercise and nutrition plan for blood glucose control.;Long Term: Attainment of HbA1C < 7%.    Heart Failure Yes    Intervention Provide a combined exercise and nutrition program that is supplemented with education, support and counseling about heart failure. Directed toward relieving symptoms such as shortness of breath, decreased exercise tolerance, and extremity edema.    Expected Outcomes Improve functional capacity of life;Short term: Attendance in program 2-3 days a week with increased exercise capacity. Reported lower sodium intake. Reported increased fruit and vegetable intake. Reports medication compliance.;Short term: Daily weights obtained and reported for increase. Utilizing diuretic protocols set by physician.;Long term: Adoption of self-care skills and reduction of barriers for early signs and symptoms recognition and intervention leading to self-care maintenance.    Hypertension Yes    Intervention Provide education on lifestyle modifcations including regular physical activity/exercise, weight management, moderate sodium restriction and increased consumption of fresh fruit, vegetables, and low fat dairy, alcohol moderation, and smoking cessation.;Monitor prescription use compliance.    Expected Outcomes Short Term: Continued assessment and intervention until BP is < 140/9027mG in hypertensive participants. < 130/65m52m in hypertensive participants with diabetes, heart failure or chronic kidney disease.;Long Term: Maintenance of  blood pressure at goal levels.    Lipids Yes    Intervention Provide education and support for participant on nutrition & aerobic/resistive exercise along with prescribed medications to achieve LDL <70mg26mL >40mg.66mExpected Outcomes Short Term: Participant states understanding of desired cholesterol values and is compliant with medications prescribed. Participant is following exercise prescription and nutrition guidelines.;Long Term: Cholesterol controlled with medications as prescribed, with individualized exercise RX and with personalized nutrition plan. Value goals: LDL < 70mg, 68m> 40 mg.             Education:Diabetes - Individual verbal and written instruction to review signs/symptoms of diabetes, desired ranges of glucose level fasting, after meals and with exercise. Acknowledge that pre and post exercise glucose checks will be done for 3 sessions at entry of program. Flowsheet Row Cardiac  Rehab from 09/02/2020 in [facility] Cardiac [facility]  Date 04/30/20  Educator [facility]  Instruction Review Code 1- Verbalizes Understanding  [States nver been diagnosed with DM and does not check BG]       Core Components/Risk Factors/Patient Goals Review:   Goals and Risk Factor Review     Row Name 06/08/20 1411 06/24/20 1425 07/29/20 1446 08/19/20 1413       Core Components/Risk Factors/Patient Goals Review   Personal Goals Review Weight Management/Obesity;Hypertension;Diabetes;Heart Failure;Lipids -- Weight Management/Obesity Weight Management/Obesity;Diabetes;Hypertension    Review His weight is holding steady over the last 30 days.  He denies any heart falure symptoms and he is back to sleeping on his side.  He is staying on top of his lasix and gets between 1600-2000 ml fluid each day and he continues to measure it daily.  Blood pressures have been good in class and  he checks it on occassion at home and when he feels a little off.  We talked about the benefits of track his pressures.   He does not check his sugars as he is diet controlled.  He has not had any feelings of feeling off with them. Koval reports his weight is staying steady within a 3 lb range.  He weighs daily.  BP has been good in class.  He saw cardiologist today and got a good report.  He goes back in 4 months. He has cut down on volume of food.  Diloreto eats 3 to 4 small meals a day. His wife and him are on a journey to eating healthier and exercising. Knutzen has lost 25 pounds since he had his surgery and is pleased. He is looking to maintain weight at this time. He claims he is borderline diabetic but is still taking metformin. He states his doctor says he doesn't need to check his sugars unless he is feeling symptomatic. He checks BP at home which ranges at 112-120/60-70s. BPs at rehab are stable as well. Sees his heart doctor tomorrow as he is going to talk about his SOB that he has been having. He denies feeling those symptoms in the last week,    Expected Outcomes Short: Continue to montior weight closely Long: Conitnue to manage heart failure. Short: continue to monitor HF symptoms Short: maintain a heart healthy diet. Long: maintain portion control independently Short: Talk to doctor about SOB Long: Continue to manage lifestyle risk factors             Core Components/Risk Factors/Patient Goals at Discharge (Final Review):   Goals and Risk Factor Review - 08/19/20 1413       Core Components/Risk Factors/Patient Goals Review   Personal Goals Review Weight Management/Obesity;Diabetes;Hypertension    Review Thibeau has lost 25 pounds since he had his surgery and is pleased. He is looking to maintain weight at this time. He claims he is borderline diabetic but is still taking metformin. He states his doctor says he doesn't need to check his sugars unless he is feeling symptomatic. He checks BP at home which ranges at 112-120/60-70s. BPs at rehab are stable as well. Sees his heart doctor tomorrow as he is going to talk  about his SOB that he has been having. He denies feeling those symptoms in the last week,    Expected Outcomes Short: Talk to doctor about SOB Long: Continue to manage lifestyle risk factors             ITP Comments:  ITP Comments  [place] Name 04/30/20 0914 05/04/20 1223 05/13/20 1000 05/14/20 1407 05/19/20 1001   ITP Comments Virtual Visit completed. Patient informed on EP and RD appointment and 6 Minute walk test. Patient also informed of patient health questionnaires on My Chart. Patient Verbalizes understanding. Visit diagnosis can be found in [facility] 03/21/2020. Completed 6MWT and gym orientation. Initial ITP created and sent for review to Dr. Emily Filbert, Medical Director. 30 Day review completed. Medical Director ITP review done, changes made as directed, and signed approval by Medical Director. First full day of exercise!  Patient was oriented to gym and equipment including functions, settings, policies, and procedures.  Patient's individual exercise prescription and treatment plan were reviewed.  All starting workloads were established based on the results of the 6 minute walk test done at initial orientation visit.  The plan for exercise progression was also introduced and progression will be customized based on patient's performance and goals. Completed initial RD Consultation    [place] Name 06/10/20 0659 07/08/20 0804 07/14/20 1543 08/05/20 0845 08/06/20 1147   ITP Comments 30 Day review completed. Medical Director ITP review done, changes made as directed, and signed approval by Medical Director. 30 Day review completed. Medical Director ITP review done, changes made as directed, and signed approval by Medical Director. 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 30 Day review completed. Medical Director ITP review done, changes made as directed, and signed approval by Medical Director. Hettinger has not been sleeping due to chemo and is seeing his cardiologist for  shortness of breath. Will follow up next week (week of 08/10/20)    [place] Name 08/31/20 1423 09/02/20 0728         ITP Comments Pt able to follow exercise prescription today without complaint.  Discussed weight gain of approx. 12 lbs since his last visit on 7/27. Pt stated he has been in the bed for about a week due to a reaction with chemo medication. Pt stated no difficulty breathing and was feeling well. Will continue to monitor for progression. 30 Day review completed. Medical Director ITP review done, changes made as directed, and signed approval by Medical Director.               Comments: discharge ITP

## 2020-09-09 NOTE — Progress Notes (Unsigned)
Discharge Progress Report  Patient Details  Name: Roy [name] MRN: [id number] Date of Birth: 1945/12/04 Referring Provider:   Flowsheet Row Cardiac Rehab from 05/04/2020 in [facility] Cardiac and Pulmonary Rehab  Referring Provider [name] MD        Number of Visits: 30  Reason for Discharge:  Patient reached a stable level of exercise. Patient independent in their exercise. Patient has met program and personal goals.  Smoking History:  Social History   Tobacco Use  Smoking Status Former   Types: Pipe   Quit date: 11/29/1976   Years since quitting: 43.8  Smokeless Tobacco Never    Diagnosis:  S/P CABG x 4  ADL UCSD:   Initial Exercise Prescription:  Initial Exercise Prescription - 05/04/20 1200       Date of Initial Exercise RX and Referring Provider   Date 05/04/20    Referring Provider [name] MD      Treadmill   MPH 1.7    Grade 0.5    Minutes 15    METs 2.42      Recumbant Bike   Level 2    RPM 60    Watts 10    Minutes 15    METs 2.1      NuStep   Level 2    SPM 80    Minutes 15    METs 2.1      T5 Nustep   Level 1    SPM 80    Minutes 15    METs 2.1      Prescription Details   Frequency (times per week) 2    Duration Progress to 30 minutes of continuous aerobic without signs/symptoms of physical distress      Intensity   THRR 40-80% of Max Heartrate 116-134    Ratings of Perceived Exertion 11-13    Perceived Dyspnea 0-4      Progression   Progression Continue to progress workloads to maintain intensity without signs/symptoms of physical distress.      Resistance Training   Training Prescription Yes    Weight 3 lb    Reps 10-15             Discharge Exercise Prescription (Final Exercise Prescription Changes):  Exercise Prescription Changes - 08/25/20 1400       Response to Exercise   Blood Pressure (Admit) 108/62    Blood Pressure (Exit) 98/60    Heart Rate (Admit) 107 bpm    Heart Rate (Exercise) 108 bpm     Heart Rate (Exit) 109 bpm    Oxygen Saturation (Admit) 95 %    Oxygen Saturation (Exercise) 87 %    Oxygen Saturation (Exit) 98 %    Rating of Perceived Exertion (Exercise) 11    Symptoms none    Duration Continue with 30 min of aerobic exercise without signs/symptoms of physical distress.    Intensity THRR unchanged      Progression   Progression Continue to progress workloads to maintain intensity without signs/symptoms of physical distress.    Average METs 2.37      Resistance Training   Training Prescription Yes    Weight 3 lb    Reps 10-15      Interval Training   Interval Training No      Treadmill   MPH 1.1    Minutes 15    METs 1.84      Recumbant Bike   Level 2    Minutes 15  METs 2.91      Home Exercise Plan   Plans to continue exercise at Home (comment)   walking, weights, staff videos   Frequency Add 2 additional days to program exercise sessions.    Initial Home Exercises Provided 06/08/20             Functional Capacity:  6 Minute Walk     Row Name 05/04/20 1245 09/02/20 1441       6 Minute Walk   Phase Initial Discharge    Distance 1000 feet 1065 feet    Distance % Change -- 6.7 %    Distance Feet Change -- 65 ft    Walk Time 6 minutes 6 minutes    # of Rest Breaks 0 0    MPH 1.89 2.01    METS 2.19 2.27    RPE 11 11    Perceived Dyspnea  1 1    VO2 Peak 7.69 7.95    Symptoms No No    Resting HR 98 bpm 102 bpm    Resting BP 112/68 124/70    Resting Oxygen Saturation  99 % 98 %    Exercise Oxygen Saturation  during 6 min walk 99 % 95 %    Max Ex. HR 116 bpm 117 bpm    Max Ex. BP 122/66 118/68    2 Minute Post BP 110/68 --             Psychological, QOL, Others - Outcomes: PHQ 2/9: Depression screen [facility] 2/9 09/09/2020 05/04/2020  Decreased Interest 1 0  Down, Depressed, Hopeless 0 0  PHQ - 2 Score 1 0  Altered sleeping 1 2  Tired, decreased energy 1 1  Change in appetite 0 1  Feeling bad or failure about yourself  0 0   Trouble concentrating 0 0  Moving slowly or fidgety/restless 0 0  Suicidal thoughts 0 0  PHQ-9 Score 3 4  Difficult doing work/chores Not difficult at all Not difficult at all    Quality of Life:  Quality of Life - 09/09/20 1447       Quality of Life Scores   Health/Function Pre 20.07 %    Health/Function Post 21.47 %    Health/Function % Change 6.98 %    Socioeconomic Pre 25.57 %    Socioeconomic Post 25.5 %    Socioeconomic % Change  -0.27 %    Psych/Spiritual Pre 30 %    Psych/Spiritual Post 28.79 %    Psych/Spiritual % Change -4.03 %    Family Pre 27 %    Family Post 28.8 %    Family % Change 6.67 %    GLOBAL Pre 24.31 %    GLOBAL Post 24.9 %    GLOBAL % Change 2.43 %                Nutrition & Weight - Outcomes:  Pre Biometrics - 05/04/20 1236       Pre Biometrics   Height 6' (1.829 m)    Weight 203 lb 8 oz (92.3 kg)    BMI (Calculated) 27.59    Single Leg Stand 15.9 seconds             Post Biometrics - 09/02/20 1443        Post  Biometrics   Height 6' (1.829 m)    Weight 205 lb (93 kg)    BMI (Calculated) 27.8             Nutrition:  Nutrition Therapy & Goals - 05/19/20 1002       Nutrition Therapy   Diet Heart healthy, low Na, Diabetes friendly    Protein (specify units) 95-110g (active cancer - not in treatment yet)    Fiber 30 grams    Whole Grain Foods 3 servings    Saturated Fats 12 max. grams    Fruits and Vegetables 8 servings/day    Sodium 1.5 grams      Personal Nutrition Goals   Nutrition Goal ST: Increase fruit/vegetable intake to 5 servings per day (eat a rainbow per week), limit processed meat to <2x/month, mix brown rice with white rice to see how he enjoys it. LT: meet nutritional needs going through treatment, make sure RD is part of care team    Comments He has active Cancer, but needs to talk with cardiologist regarding his heart status and cancer treatment. The oncologists say he needs to begin treatment  soon. Encouraged to ensure RD is a part of his treatment team. He is honoring his hunger cues and eating until satisfied, but not uncomfortably full. He is no longer eating red meat every day. He eats mostly chicken, fish, shrimp, and pork chops. He likes to eat many green vegetables which he will steam and he enjoys fruit - 3 servings per day normally (2 fruits and a vegetable; canned fruit typically -in own juice). He uses mostly olive oil, if eating eggs will use bacon fat - eats processed meat like bacon 2x/month. Does not eat much bread, but he enjoys potatoes. A1C: 6.7. He does not take his BG at home and does not have a monitor at home. B: fruit cocktail or oatmeal or griits with dried fruit or a pad of butter; this morning he had an english muffin (regular) with strawberry jelly, butter, and a salmon patty with coffee (2 splenda and half and half) and apple juice for his medications. S:2 graham cracker with PB and 2% milk or white bread and [place] sandwich D: pork chop with mac and cheese and fried apples and iced tea (sweet) tonight. He will occassionally have shot of liquor or wine. Drinks: water and occassionally pepsi, coke, dr pepper, 7-up. He reports liking whole wheat bread, but does not like it every day. Discussed heart healthy eating, diabetes friendly eating, and eating during cancer treatment (discussed possible barriers and increased needs).      Intervention Plan   Intervention Prescribe, educate and counsel regarding individualized specific dietary modifications aiming towards targeted core components such as weight, hypertension, lipid management, diabetes, heart failure and other comorbidities.;Nutrition handout(s) given to patient.    Expected Outcomes Short Term Goal: Understand basic principles of dietary content, such as calories, fat, sodium, cholesterol and nutrients.;Short Term Goal: A plan has been developed with personal nutrition goals set during dietitian appointment.;Long  Term Goal: Adherence to prescribed nutrition plan.             Nutrition Discharge:   Education Questionnaire Score:  Knowledge Questionnaire Score - 09/09/20 1445       Knowledge Questionnaire Score   Pre Score 22/26: Angina, Nutrition, Exercise    Post Score 25/26             Goals reviewed with patient; copy given to patient.

## 2020-09-11 ENCOUNTER — Other Ambulatory Visit: Payer: Self-pay

## 2020-09-11 DIAGNOSIS — C8330 Diffuse large B-cell lymphoma, unspecified site: Secondary | ICD-10-CM

## 2020-09-14 ENCOUNTER — Inpatient Hospital Stay: Payer: Medicare HMO

## 2020-09-14 ENCOUNTER — Encounter: Payer: Self-pay | Admitting: Gastroenterology

## 2020-09-14 ENCOUNTER — Inpatient Hospital Stay (HOSPITAL_BASED_OUTPATIENT_CLINIC_OR_DEPARTMENT_OTHER): Payer: Medicare HMO | Admitting: Hematology

## 2020-09-14 ENCOUNTER — Other Ambulatory Visit: Payer: Self-pay

## 2020-09-14 VITALS — BP 99/61 | HR 91 | Temp 97.8°F | Resp 18

## 2020-09-14 VITALS — BP 103/67 | HR 98 | Temp 98.0°F | Resp 18 | Wt 202.2 lb

## 2020-09-14 DIAGNOSIS — Z5112 Encounter for antineoplastic immunotherapy: Secondary | ICD-10-CM | POA: Diagnosis not present

## 2020-09-14 DIAGNOSIS — Z7189 Other specified counseling: Secondary | ICD-10-CM

## 2020-09-14 DIAGNOSIS — C8330 Diffuse large B-cell lymphoma, unspecified site: Secondary | ICD-10-CM | POA: Diagnosis not present

## 2020-09-14 DIAGNOSIS — Z5111 Encounter for antineoplastic chemotherapy: Secondary | ICD-10-CM | POA: Diagnosis not present

## 2020-09-14 DIAGNOSIS — Z95828 Presence of other vascular implants and grafts: Secondary | ICD-10-CM

## 2020-09-14 DIAGNOSIS — Z5189 Encounter for other specified aftercare: Secondary | ICD-10-CM | POA: Diagnosis not present

## 2020-09-14 DIAGNOSIS — C8335 Diffuse large B-cell lymphoma, lymph nodes of inguinal region and lower limb: Secondary | ICD-10-CM | POA: Diagnosis not present

## 2020-09-14 DIAGNOSIS — Z79899 Other long term (current) drug therapy: Secondary | ICD-10-CM | POA: Diagnosis not present

## 2020-09-14 LAB — CMP (CANCER CENTER ONLY)
ALT: 44 U/L (ref 0–44)
AST: 26 U/L (ref 15–41)
Albumin: 3.4 g/dL — ABNORMAL LOW (ref 3.5–5.0)
Alkaline Phosphatase: 131 U/L — ABNORMAL HIGH (ref 38–126)
Anion gap: 15 (ref 5–15)
BUN: 13 mg/dL (ref 8–23)
CO2: 22 mmol/L (ref 22–32)
Calcium: 7.7 mg/dL — ABNORMAL LOW (ref 8.9–10.3)
Chloride: 109 mmol/L (ref 98–111)
Creatinine: 1.27 mg/dL — ABNORMAL HIGH (ref 0.61–1.24)
GFR, Estimated: 59 mL/min — ABNORMAL LOW (ref >60)
Glucose, Bld: 166 mg/dL — ABNORMAL HIGH (ref 70–99)
Potassium: 3.3 mmol/L — ABNORMAL LOW (ref 3.5–5.1)
Sodium: 146 mmol/L — ABNORMAL HIGH (ref 135–145)
Total Bilirubin: 1 mg/dL (ref 0.3–1.2)
Total Protein: 6.2 g/dL — ABNORMAL LOW (ref 6.5–8.1)

## 2020-09-14 LAB — CBC WITH DIFFERENTIAL (CANCER CENTER ONLY)
Abs Immature Granulocytes: 0.18 10*3/uL — ABNORMAL HIGH (ref 0.00–0.07)
Basophils Absolute: 0.1 10*3/uL (ref 0.0–0.1)
Basophils Relative: 0 %
Eosinophils Absolute: 0 10*3/uL (ref 0.0–0.5)
Eosinophils Relative: 0 %
HCT: 28.4 % — ABNORMAL LOW (ref 39.0–52.0)
Hemoglobin: 8.8 g/dL — ABNORMAL LOW (ref 13.0–17.0)
Immature Granulocytes: 1 %
Lymphocytes Relative: 3 %
Lymphs Abs: 0.6 10*3/uL — ABNORMAL LOW (ref 0.7–4.0)
MCH: 27.5 pg (ref 26.0–34.0)
MCHC: 31 g/dL (ref 30.0–36.0)
MCV: 88.8 fL (ref 80.0–100.0)
Monocytes Absolute: 0.6 10*3/uL (ref 0.1–1.0)
Monocytes Relative: 3 %
Neutro Abs: 17 10*3/uL — ABNORMAL HIGH (ref 1.7–7.7)
Neutrophils Relative %: 93 %
Platelet Count: 284 10*3/uL (ref 150–400)
RBC: 3.2 MIL/uL — ABNORMAL LOW (ref 4.22–5.81)
RDW: 16.8 % — ABNORMAL HIGH (ref 11.5–15.5)
WBC Count: 18.6 10*3/uL — ABNORMAL HIGH (ref 4.0–10.5)
nRBC: 0 % (ref 0.0–0.2)

## 2020-09-14 MED ORDER — VINCRISTINE SULFATE CHEMO INJECTION 1 MG/ML
1.0000 mg | Freq: Once | INTRAVENOUS | Status: AC
  Administered 2020-09-14: 1 mg via INTRAVENOUS
  Filled 2020-09-14: qty 1

## 2020-09-14 MED ORDER — SODIUM CHLORIDE 0.9 % IV SOLN
10.0000 mg | Freq: Once | INTRAVENOUS | Status: AC
  Administered 2020-09-14: 10 mg via INTRAVENOUS
  Filled 2020-09-14: qty 10

## 2020-09-14 MED ORDER — DIPHENHYDRAMINE HCL 25 MG PO CAPS
50.0000 mg | ORAL_CAPSULE | Freq: Once | ORAL | Status: AC
  Administered 2020-09-14: 50 mg via ORAL
  Filled 2020-09-14: qty 2

## 2020-09-14 MED ORDER — HEPARIN SOD (PORK) LOCK FLUSH 100 UNIT/ML IV SOLN
500.0000 [IU] | Freq: Once | INTRAVENOUS | Status: AC | PRN
  Administered 2020-09-14: 500 [IU]

## 2020-09-14 MED ORDER — SODIUM CHLORIDE 0.9 % IV SOLN: Freq: Once | INTRAVENOUS | Status: AC

## 2020-09-14 MED ORDER — SODIUM CHLORIDE 0.9 % IV SOLN
400.0000 mg/m2 | Freq: Once | INTRAVENOUS | Status: AC
  Administered 2020-09-14: 860 mg via INTRAVENOUS
  Filled 2020-09-14: qty 43

## 2020-09-14 MED ORDER — PALONOSETRON HCL INJECTION 0.25 MG/5ML
0.2500 mg | Freq: Once | INTRAVENOUS | Status: AC
  Administered 2020-09-14: 0.25 mg via INTRAVENOUS
  Filled 2020-09-14: qty 5

## 2020-09-14 MED ORDER — SODIUM CHLORIDE 0.9% FLUSH
10.0000 mL | Freq: Once | INTRAVENOUS | Status: AC
  Administered 2020-09-14: 10 mL

## 2020-09-14 MED ORDER — SODIUM CHLORIDE 0.9% FLUSH
10.0000 mL | INTRAVENOUS | Status: DC | PRN
  Administered 2020-09-14: 10 mL

## 2020-09-14 MED ORDER — SODIUM CHLORIDE 0.9 % IV SOLN
375.0000 mg/m2 | Freq: Once | INTRAVENOUS | Status: AC
  Administered 2020-09-14: 800 mg via INTRAVENOUS
  Filled 2020-09-14: qty 50

## 2020-09-14 MED ORDER — ACETAMINOPHEN 325 MG PO TABS
650.0000 mg | ORAL_TABLET | Freq: Once | ORAL | Status: AC
  Administered 2020-09-14: 650 mg via ORAL
  Filled 2020-09-14: qty 2

## 2020-09-14 MED ORDER — SODIUM CHLORIDE 0.9 % IV SOLN
35.0000 mg/m2 | Freq: Once | INTRAVENOUS | Status: AC
  Administered 2020-09-14: 80 mg via INTRAVENOUS
  Filled 2020-09-14: qty 4

## 2020-09-14 NOTE — Patient Instructions (Signed)
[facility]COL[facility]  Discharge Instructions: Thank you for choosing [facility] to provide your oncology and hematology care.   If you have a lab appointment with the [facility], please go directly to the [facility] and check in at the registration area.   Wear comfortable clothing and clothing appropriate for easy access to any Portacath or PICC line.   We strive to give you quality time with your provider. You may need to reschedule your appointment if you arrive late (15 or more minutes).  Arriving late affects you and other patients whose appointments are after yours.  Also, if you miss three or more appointments without notifying the office, you may be dismissed from the clinic at the provider's discretion.      For prescription refill requests, have your pharmacy contact our office and allow 72 hours for refills to be completed.    Today you received the following chemotherapy and/or immunotherapy agents R-CEOP      To help prevent nausea and vomiting after your treatment, we encourage you to take your nausea medication as directed.  BELOW ARE SYMPTOMS THAT SHOULD BE REPORTED IMMEDIATELY: *FEVER GREATER THAN 100.4 F (38 C) OR HIGHER *CHILLS OR SWEATING *NAUSEA AND VOMITING THAT IS NOT CONTROLLED WITH YOUR NAUSEA MEDICATION *UNUSUAL SHORTNESS OF BREATH *UNUSUAL BRUISING OR BLEEDING *URINARY PROBLEMS (pain or burning when urinating, or frequent urination) *BOWEL PROBLEMS (unusual diarrhea, constipation, pain near the anus) TENDERNESS IN MOUTH AND THROAT WITH OR WITHOUT PRESENCE OF ULCERS (sore throat, sores in mouth, or a toothache) UNUSUAL RASH, SWELLING OR PAIN  UNUSUAL VAGINAL DISCHARGE OR ITCHING   Items with * indicate a potential emergency and should be followed up as soon as possible or go to the Emergency Department if any problems should occur.  Please show the CHEMOTHERAPY ALERT CARD or IMMUNOTHERAPY ALERT CARD at check-in to the  Emergency Department and triage nurse.  Should you have questions after your visit or need to cancel or reschedule your appointment, please contact [facility]  Dept: [phone]  and follow the prompts.  Office hours are 8:00 a.m. to 4:30 p.m. Monday - Friday. Please note that voicemails left after 4:00 p.m. may not be returned until the following business day.  We are closed weekends and major holidays. You have access to a nurse at all times for urgent questions. Please call the main number to the clinic Dept: [phone] and follow the prompts.   For any non-urgent questions, you may also contact your provider using MyChart. We now offer e-Visits for anyone 3 and older to request care online for non-urgent symptoms. For details visit [email or web address].   Also download the MyChart app! Go to the app store, search "MyChart", open the app, select [facility], and log in with your MyChart username and password.  Due to Covid, a mask is required upon entering the hospital/clinic. If you do not have a mask, one will be given to you upon arrival. For doctor visits, patients may have 1 support person aged 32 or older with them. For treatment visits, patients cannot have anyone with them due to current Covid guidelines and our immunocompromised population.

## 2020-09-15 ENCOUNTER — Inpatient Hospital Stay: Payer: Medicare HMO

## 2020-09-15 VITALS — BP 98/68 | HR 95 | Temp 98.1°F | Resp 17

## 2020-09-15 DIAGNOSIS — C8335 Diffuse large B-cell lymphoma, lymph nodes of inguinal region and lower limb: Secondary | ICD-10-CM | POA: Diagnosis not present

## 2020-09-15 DIAGNOSIS — Z79899 Other long term (current) drug therapy: Secondary | ICD-10-CM | POA: Diagnosis not present

## 2020-09-15 DIAGNOSIS — Z5112 Encounter for antineoplastic immunotherapy: Secondary | ICD-10-CM | POA: Diagnosis not present

## 2020-09-15 DIAGNOSIS — C8330 Diffuse large B-cell lymphoma, unspecified site: Secondary | ICD-10-CM

## 2020-09-15 DIAGNOSIS — Z7189 Other specified counseling: Secondary | ICD-10-CM

## 2020-09-15 DIAGNOSIS — Z5111 Encounter for antineoplastic chemotherapy: Secondary | ICD-10-CM | POA: Diagnosis not present

## 2020-09-15 DIAGNOSIS — Z5189 Encounter for other specified aftercare: Secondary | ICD-10-CM | POA: Diagnosis not present

## 2020-09-15 MED ORDER — HEPARIN SOD (PORK) LOCK FLUSH 100 UNIT/ML IV SOLN
500.0000 [IU] | Freq: Once | INTRAVENOUS | Status: AC | PRN
  Administered 2020-09-15: 500 [IU]

## 2020-09-15 MED ORDER — PROCHLORPERAZINE MALEATE 10 MG PO TABS
10.0000 mg | ORAL_TABLET | Freq: Once | ORAL | Status: AC
  Administered 2020-09-15: 10 mg via ORAL
  Filled 2020-09-15: qty 1

## 2020-09-15 MED ORDER — SODIUM CHLORIDE 0.9% FLUSH
10.0000 mL | INTRAVENOUS | Status: DC | PRN
  Administered 2020-09-15: 10 mL

## 2020-09-15 MED ORDER — SODIUM CHLORIDE 0.9 % IV SOLN
35.0000 mg/m2 | Freq: Once | INTRAVENOUS | Status: AC
  Administered 2020-09-15: 80 mg via INTRAVENOUS
  Filled 2020-09-15: qty 4

## 2020-09-15 MED ORDER — SODIUM CHLORIDE 0.9 % IV SOLN: Freq: Once | INTRAVENOUS | Status: AC

## 2020-09-15 NOTE — Patient Instructions (Signed)
[facility]  Discharge Instructions: ?Thank you for choosing [facility] to provide your oncology and hematology care.  ? ?If you have a lab appointment with the Cancer Center, please go directly to the Cancer Center and check in at the registration area. ?  ?Wear comfortable clothing and clothing appropriate for easy access to any Portacath or PICC line.  ? ?We strive to give you quality time with your provider. You may need to reschedule your appointment if you arrive late (15 or more minutes).  Arriving late affects you and other patients whose appointments are after yours.  Also, if you miss three or more appointments without notifying the office, you may be dismissed from the clinic at the provider?s discretion.    ?  ?For prescription refill requests, have your pharmacy contact our office and allow 72 hours for refills to be completed.   ? ?Today you received the following chemotherapy and/or immunotherapy agents: Etoposide.     ?  ?To help prevent nausea and vomiting after your treatment, we encourage you to take your nausea medication as directed. ? ?BELOW ARE SYMPTOMS THAT SHOULD BE REPORTED IMMEDIATELY: ?*FEVER GREATER THAN 100.4 F (38 ?C) OR HIGHER ?*CHILLS OR SWEATING ?*NAUSEA AND VOMITING THAT IS NOT CONTROLLED WITH YOUR NAUSEA MEDICATION ?*UNUSUAL SHORTNESS OF BREATH ?*UNUSUAL BRUISING OR BLEEDING ?*URINARY PROBLEMS (pain or burning when urinating, or frequent urination) ?*BOWEL PROBLEMS (unusual diarrhea, constipation, pain near the anus) ?TENDERNESS IN MOUTH AND THROAT WITH OR WITHOUT PRESENCE OF ULCERS (sore throat, sores in mouth, or a toothache) ?UNUSUAL RASH, SWELLING OR PAIN  ?UNUSUAL VAGINAL DISCHARGE OR ITCHING  ? ?Items with * indicate a potential emergency and should be followed up as soon as possible or go to the Emergency Department if any problems should occur. ? ?Please show the CHEMOTHERAPY ALERT CARD or IMMUNOTHERAPY ALERT CARD at check-in  to the Emergency Department and triage nurse. ? ?Should you have questions after your visit or need to cancel or reschedule your appointment, please contact [facility]  Dept: [phone]  and follow the prompts.  Office hours are 8:00 a.m. to 4:30 p.m. Monday - Friday. Please note that voicemails left after 4:00 p.m. may not be returned until the following business day.  We are closed weekends and major holidays. You have access to a nurse at all times for urgent questions. Please call the main number to the clinic Dept: [phone] and follow the prompts. ? ? ?For any non-urgent questions, you may also contact your provider using MyChart. We now offer e-Visits for anyone 18 and older to request care online for non-urgent symptoms. For details visit [email or web address]. ?  ?Also download the MyChart app! Go to the app store, search "MyChart", open the app, select [facility], and log in with your MyChart username and password. ? ?Due to Covid, a mask is required upon entering the hospital/clinic. If you do not have a mask, one will be given to you upon arrival. For doctor visits, patients may have 1 support person aged 18 or older with them. For treatment visits, patients cannot have anyone with them due to current Covid guidelines and our immunocompromised population.  ? ?

## 2020-09-16 ENCOUNTER — Inpatient Hospital Stay: Payer: Medicare HMO

## 2020-09-16 ENCOUNTER — Other Ambulatory Visit: Payer: Self-pay

## 2020-09-16 ENCOUNTER — Telehealth: Payer: Self-pay | Admitting: Hematology

## 2020-09-16 VITALS — BP 106/73 | HR 94 | Temp 97.5°F | Resp 18

## 2020-09-16 DIAGNOSIS — Z79899 Other long term (current) drug therapy: Secondary | ICD-10-CM | POA: Diagnosis not present

## 2020-09-16 DIAGNOSIS — C8335 Diffuse large B-cell lymphoma, lymph nodes of inguinal region and lower limb: Secondary | ICD-10-CM | POA: Diagnosis not present

## 2020-09-16 DIAGNOSIS — Z7189 Other specified counseling: Secondary | ICD-10-CM

## 2020-09-16 DIAGNOSIS — Z5111 Encounter for antineoplastic chemotherapy: Secondary | ICD-10-CM | POA: Diagnosis not present

## 2020-09-16 DIAGNOSIS — Z5112 Encounter for antineoplastic immunotherapy: Secondary | ICD-10-CM | POA: Diagnosis not present

## 2020-09-16 DIAGNOSIS — Z5189 Encounter for other specified aftercare: Secondary | ICD-10-CM | POA: Diagnosis not present

## 2020-09-16 DIAGNOSIS — C8330 Diffuse large B-cell lymphoma, unspecified site: Secondary | ICD-10-CM

## 2020-09-16 MED ORDER — SODIUM CHLORIDE 0.9% FLUSH
10.0000 mL | INTRAVENOUS | Status: DC | PRN
Start: 2020-09-16 — End: 2020-09-16
  Administered 2020-09-16: 10 mL

## 2020-09-16 MED ORDER — SODIUM CHLORIDE 0.9 % IV SOLN
35.0000 mg/m2 | Freq: Once | INTRAVENOUS | Status: AC
  Administered 2020-09-16: 80 mg via INTRAVENOUS
  Filled 2020-09-16: qty 4

## 2020-09-16 MED ORDER — SODIUM CHLORIDE 0.9 % IV SOLN: Freq: Once | INTRAVENOUS | Status: AC

## 2020-09-16 MED ORDER — PROCHLORPERAZINE MALEATE 10 MG PO TABS
10.0000 mg | ORAL_TABLET | Freq: Once | ORAL | Status: AC
  Administered 2020-09-16: 10 mg via ORAL
  Filled 2020-09-16: qty 1

## 2020-09-16 MED ORDER — HEPARIN SOD (PORK) LOCK FLUSH 100 UNIT/ML IV SOLN
500.0000 [IU] | Freq: Once | INTRAVENOUS | Status: AC | PRN
  Administered 2020-09-16: 500 [IU]

## 2020-09-16 NOTE — Patient Instructions (Signed)
[facility]ICAL [facility]COLOG[facility]  Discharge Instructions: ?Thank you for choosing [facility] to provide your oncology and hematology care.  ? ?If you have a lab appointment with the Cancer Center, please go directly to the Cancer Center and check in at the registration area. ?  ?Wear comfortable clothing and clothing appropriate for easy access to any Portacath or PICC line.  ? ?We strive to give you quality time with your provider. You may need to reschedule your appointment if you arrive late (15 or more minutes).  Arriving late affects you and other patients whose appointments are after yours.  Also, if you miss three or more appointments without notifying the office, you may be dismissed from the clinic at the provider?s discretion.    ?  ?For prescription refill requests, have your pharmacy contact our office and allow 72 hours for refills to be completed.   ? ?Today you received the following chemotherapy and/or immunotherapy agents: Etoposide.     ?  ?To help prevent nausea and vomiting after your treatment, we encourage you to take your nausea medication as directed. ? ?BELOW ARE SYMPTOMS THAT SHOULD BE REPORTED IMMEDIATELY: ?*FEVER GREATER THAN 100.4 F (38 ?C) OR HIGHER ?*CHILLS OR SWEATING ?*NAUSEA AND VOMITING THAT IS NOT CONTROLLED WITH YOUR NAUSEA MEDICATION ?*UNUSUAL SHORTNESS OF BREATH ?*UNUSUAL BRUISING OR BLEEDING ?*URINARY PROBLEMS (pain or burning when urinating, or frequent urination) ?*BOWEL PROBLEMS (unusual diarrhea, constipation, pain near the anus) ?TENDERNESS IN MOUTH AND THROAT WITH OR WITHOUT PRESENCE OF ULCERS (sore throat, sores in mouth, or a toothache) ?UNUSUAL RASH, SWELLING OR PAIN  ?UNUSUAL VAGINAL DISCHARGE OR ITCHING  ? ?Items with * indicate a potential emergency and should be followed up as soon as possible or go to the Emergency Department if any problems should occur. ? ?Please show the CHEMOTHERAPY ALERT CARD or IMMUNOTHERAPY ALERT CARD at check-in  to the Emergency Department and triage nurse. ? ?Should you have questions after your visit or need to cancel or reschedule your appointment, please contact [facility]  Dept: [phone]  and follow the prompts.  Office hours are 8:00 a.m. to 4:30 p.m. Monday - Friday. Please note that voicemails left after 4:00 p.m. may not be returned until the following business day.  We are closed weekends and major holidays. You have access to a nurse at all times for urgent questions. Please call the main number to the clinic Dept: [phone] and follow the prompts. ? ? ?For any non-urgent questions, you may also contact your provider using MyChart. We now offer e-Visits for anyone 18 and older to request care online for non-urgent symptoms. For details visit [email or web address]. ?  ?Also download the MyChart app! Go to the app store, search "MyChart", open the app, select [facility], and log in with your MyChart username and password. ? ?Due to Covid, a mask is required upon entering the hospital/clinic. If you do not have a mask, one will be given to you upon arrival. For doctor visits, patients may have 1 support person aged 18 or older with them. For treatment visits, patients cannot have anyone with them due to current Covid guidelines and our immunocompromised population.  ? ?

## 2020-09-16 NOTE — Telephone Encounter (Signed)
Left message with follow-up appointments per 8/22 los. 

## 2020-09-18 ENCOUNTER — Inpatient Hospital Stay: Payer: Medicare HMO

## 2020-09-18 ENCOUNTER — Other Ambulatory Visit: Payer: Self-pay

## 2020-09-18 VITALS — BP 104/64 | HR 88 | Temp 97.6°F | Resp 18

## 2020-09-18 DIAGNOSIS — Z5112 Encounter for antineoplastic immunotherapy: Secondary | ICD-10-CM | POA: Diagnosis not present

## 2020-09-18 DIAGNOSIS — Z7189 Other specified counseling: Secondary | ICD-10-CM

## 2020-09-18 DIAGNOSIS — Z79899 Other long term (current) drug therapy: Secondary | ICD-10-CM | POA: Diagnosis not present

## 2020-09-18 DIAGNOSIS — C8335 Diffuse large B-cell lymphoma, lymph nodes of inguinal region and lower limb: Secondary | ICD-10-CM | POA: Diagnosis not present

## 2020-09-18 DIAGNOSIS — C8330 Diffuse large B-cell lymphoma, unspecified site: Secondary | ICD-10-CM

## 2020-09-18 DIAGNOSIS — Z5189 Encounter for other specified aftercare: Secondary | ICD-10-CM | POA: Diagnosis not present

## 2020-09-18 DIAGNOSIS — Z5111 Encounter for antineoplastic chemotherapy: Secondary | ICD-10-CM | POA: Diagnosis not present

## 2020-09-18 MED ORDER — PEGFILGRASTIM-CBQV 6 MG/0.6ML ~~LOC~~ SOSY
6.0000 mg | PREFILLED_SYRINGE | Freq: Once | SUBCUTANEOUS | Status: AC
  Administered 2020-09-18: 6 mg via SUBCUTANEOUS
  Filled 2020-09-18: qty 0.6

## 2020-09-18 NOTE — Progress Notes (Signed)
Pt states he is experiencing SHOB at times starting yesterday and head pain when he stands up but it goes away. Vitals are stable. Beth wright RN is aware and will make MD [facility] aware. Pt is expected to get a call later in the day.

## 2020-09-18 NOTE — Patient Instructions (Signed)

## 2020-09-20 ENCOUNTER — Encounter: Payer: Self-pay | Admitting: Hematology

## 2020-09-20 NOTE — Progress Notes (Signed)
.    HEMATOLOGY/ONCOLOGY CLINIC NOTE  Date of Service: .09/14/2020   Patient Care Team: Collins, Dana, DO as PCP - General (Family Medicine) O'Neal, [place] Thomas, MD as PCP - Cardiology (Cardiology)  CHIEF COMPLAINTS/PURPOSE OF CONSULTATION:  Primary testicular large B cell lymphoma  HISTORY OF PRESENTING ILLNESS:   Roy [name] is a wonderful 74 y.o. male who has been referred to us by Dr Kim for evaluation and management of primary testicular large B-cell lymphoma  Patient has a history of hypertension, borderline diabetes, previous nonischemic cardiomyopathy but with a recent MI,, CHF who presented with a left testicular swelling end of sept/ early oct 2021.  He notes that he was seen by Urology - Dr Christopher Winters and had an ultrasound--possible infection was suspected and he received 2 rounds of abx without significant improvement.  He notes he received 2 subsequent follow-up ultrasounds in December in January and finally had left transinguinal orchiectomy on 03/11/2020. He notes he had persistent testicular swelling post operatively which required drainage.  He also notes having issues with urinary retention and had a urinary catheter for 2 weeks, abx+  Pathology showed primary testicular large B-cell lymphoma.  No other additional imaging studies for staging have been done at this time.  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  Patient was admitted to the hospital on 03/21/2020 with acute onset dyspnea epigastric pain and cough with pink frothy sputum.  He was noted to have hypoxia with oxygen saturation of 82% on arrival.  He was noted to have non-ST elevation MI on 2/26.  CTA chest ruled out PE.  His symptoms from fluid with aggressive diuresis.  He was noted to have severe triple-vessel disease on cath with an ejection fraction of 25 to 35% with no significant valvular lesions.  He subsequently had CORONARY ARTERY BYPASS  GRAFTING (CABG), ON PUMP, TIMES FOUR, USING BILATERAL INTERNAL MAMMARY ARTERIES AND LEFT RADIAL ARTERY .. On March 25, 2020.  Patient notes he is gradually recovering from his cardiac surgery notes being fatigued having gone through multiple urologic issues, skin surgery for squamous of carcinoma of the nose, acute MI CABG x4.  Patient notes no fevers no chills no night sweats. He notes he might have lost some weight but difficult to quantify in the setting of fluid overload and diuresis. Notes he is starting to eat a little better. Minimal left scrotal swelling. No other acute new focal symptoms.  INTERVAL HISTORY   Roy [name] is a wonderful 74 y.o. male who is here today for evaluation and management of Primary testicular large B cell lymphoma and prior to cycle 3 of R-CEOP chemotherapy The pt reports grade 1 fatigue. No fevers/n/v/d. No other acute new symptoms. Labs steady. He tolerated his first dose of prophylactic intrathecal methotrexate without any acute nausea or vomiting or headaches.  He did have some left thigh discomfort for a day or 2 which is now resolved.  Lab results 09/14/2020 reviewed with the patient.  On review of systems, pt reports no other acute new symptoms.   MEDICAL HISTORY:  Past Medical History:  Diagnosis Date   Allergic rhinitis    Arthritis    wrists   Cardiomyopathy, nonischemic (HCC)    followed by cardiology--- dr nelson---  2016 ef 45-50% ,  2016 nuclear ef 37%,  2017 per echo ef 50-55%   CHF (congestive heart failure), NYHA class III (HCC) 03/21/2020   Coronary artery disease      GERD (gastroesophageal reflux disease)    Hiatal hernia    History of kidney stones    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;   01/ 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   History of syncope (03-06-2020 pt stated has not had sycopal episode in few years, stated it seems to happen in extreme hot conditions)   cardiologist--- dr [name]--- dx recurrent syncope;  nuclear study 11-03-2014 intermediate risk w/ no ischemia, apical hypokinesis, nuclear ef 37%;  event monitor-- 12-21-2015 SB/ ST  no pauses/ arrythmia's;  echo 08-03-2015 ef 50-55%   Hyperlipidemia    Hypertension    followed by pcp   Hypovitaminosis D    Mass of left testicle    Nocturia    Plantar fasciitis    Presence of surgical incision    01/ 2022  moh's w/ skin graft of nose, per pt still healing and wear bandage daily   Type 2 diabetes mellitus ([place])    pt is adament that he is not and have been told he is a diabetic but a borderline;  followed by pcp, in pcp note states DM2 and takes 2 meds daily   Wears glasses    Wears hearing aid in both ears     SURGICAL HISTORY: Past Surgical History:  Procedure Laterality Date   COLONOSCOPY  last one 01-30-2017   CORONARY ARTERY BYPASS GRAFT N/A 03/25/2020   Procedure: CORONARY ARTERY BYPASS GRAFTING (CABG), ON PUMP, TIMES FOUR, USING BILATERAL INTERNAL MAMMARY ARTERIES AND LEFT RADIAL ARTERY;  Surgeon: [name], MD;  Location: [facility];  Service: Open Heart Surgery;  Laterality: N/A;   IR IMAGING GUIDED PORT INSERTION  07/06/2020   LOW ANTERIOR RESECTION RECTUM W/ COLOPROCTOSTOMY  05/2003   MOHS SURGERY  01/2020   nose w/ graft   ORCHIECTOMY Left 03/11/2020   Procedure: [place];  Surgeon: [name], MD;  Location[facility];  Service: Urology;  Laterality: Left;  ONLY NEEDS 60 MIN   RADIAL ARTERY HARVEST Left 03/25/2020   Procedure: LEFT RADIAL ARTERY HARVEST;  Surgeon: [name], MD;  Location: [facility];  Service: Open Heart Surgery;  Laterality: Left;   RIGHT/LEFT HEART CATH AND CORONARY ANGIOGRAPHY N/A 03/23/2020   Procedure: RIGHT/LEFT HEART CATH AND CORONARY ANGIOGRAPHY;  Surgeon: [name], MD;  Location: [facility];  Service: Cardiovascular;  Laterality: N/A;   SHOULDER SURGERY Right 1992; 07/ 2021   squamous cell carcinoma  resection of the left ear Left 12/24/2008   left ear and nose    TEE WITHOUT CARDIOVERSION N/A 03/25/2020   Procedure: TRANSESOPHAGEAL ECHOCARDIOGRAM (TEE);  Surgeon: [name], MD;  Location: [facility];  Service: Open Heart Surgery;  Laterality: N/A;   TONSILLECTOMY AND ADENOIDECTOMY  child   UPPER GASTROINTESTINAL ENDOSCOPY  last one 06-06-2017    SOCIAL HISTORY: Social History   Socioeconomic History   Marital status: Married    Spouse name: Not on file   Number of children: Not on file   Years of education: Not on file   Highest education level: Not on file  Occupational History   Not on file  Tobacco Use   Smoking status: Former    Types: Pipe    Quit date: 11/29/1976    Years since quitting: 43.8   Smokeless tobacco: Never  Vaping Use   Vaping Use: Never used  Substance and Sexual Activity  Alcohol use: Yes    Alcohol/week: 0.0 standard drinks    Comment: Occasional drink    Drug use: Never   Sexual activity: Not on file  Other Topics Concern   Not on file  Social History Narrative   Not on file   Social Determinants of Health   Financial Resource Strain: Not on file  Food Insecurity: Not on file  Transportation Needs: Not on file  Physical Activity: Not on file  Stress: Not on file  Social Connections: Not on file  Intimate Partner Violence: Not on file    FAMILY HISTORY: Family History  Problem Relation Age of Onset   Heart attack Mother    Stroke Father    Colon cancer Neg Hx    Esophageal cancer Neg Hx    Pancreatic cancer Neg Hx    Rectal cancer Neg Hx    Stomach cancer Neg Hx    Colon polyps Neg Hx     ALLERGIES:  has No Known Allergies.  MEDICATIONS:  Current Outpatient Medications  Medication Sig Dispense Refill   acetaminophen (TYLENOL) 325 MG tablet Take 2 tablets (650 mg total) by mouth every 4 (four) hours as needed for headache or mild pain.     Apoaequorin (PREVAGEN PO) Take 1 tablet by mouth at bedtime.     aspirin EC 81 MG  tablet Take 81 mg by mouth daily. Swallow whole.     atorvastatin (LIPITOR) 40 MG tablet Take 1 tablet (40 mg total) by mouth daily. 90 tablet 3   cholecalciferol (VITAMIN D) 1000 UNITS tablet Take 2,000 Units by mouth See admin instructions. Mon-friday     empagliflozin (JARDIANCE) 10 MG TABS tablet Take 1 tablet (10 mg total) by mouth daily before breakfast. 90 tablet 1   furosemide (LASIX) 80 MG tablet Take 0.5 tablets (40 mg total) by mouth daily. 30 tablet 11   glucosamine-chondroitin 500-400 MG tablet Take 1 tablet by mouth. 5 times weekly     LORazepam (ATIVAN) 0.5 MG tablet Take 1 tablet (0.5 mg total) by mouth every 6 (six) hours as needed for anxiety. Put 1 pill under your tongue every 6 hrs, if needed, for nausea 30 tablet 0   metFORMIN (GLUCOPHAGE) 1000 MG tablet Take 1,000 mg by mouth 2 (two) times daily with a meal.      metoprolol (TOPROL XL) 200 MG 24 hr tablet Take 1 tablet (200 mg total) by mouth daily. 90 tablet 3   Multiple Vitamin (MULTIVITAMIN WITH MINERALS) TABS tablet Take 1 tablet by mouth. 5 times weekly     omeprazole (PRILOSEC) 20 MG capsule Take 20 mg by mouth daily.     ondansetron (ZOFRAN) 8 MG tablet Take 1 tablet (8 mg total) by mouth every 8 (eight) hours as needed for nausea or vomiting. Take 1 pill every 8 hrs for 3 days. 30 tablet 3   prochlorperazine (COMPAZINE) 10 MG tablet Take 1 tablet (10 mg total) by mouth every 8 (eight) hours as needed for nausea or vomiting. 40 tablet 2   sacubitril-valsartan (ENTRESTO) 24-26 MG Take 1 tablet by mouth 2 (two) times daily. 60 tablet 3   spironolactone (ALDACTONE) 25 MG tablet Take 0.5 tablets (12.5 mg total) by mouth daily. 60 tablet 1   tamsulosin (FLOMAX) 0.4 MG CAPS capsule Take 0.4 mg by mouth at bedtime.     vitamin B-12 (CYANOCOBALAMIN) 1000 MCG tablet Take 1,000 mcg by mouth. 5 times a week     Current Facility-Administered Medications  Medication   Dose Route Frequency Provider Last Rate Last Admin   potassium  chloride (KLOR-CON) CR tablet 20 mEq  20 mEq Oral BID [name], MD        REVIEW OF SYSTEMS:   .10 Point review of Systems was done is negative except as noted above.    PHYSICAL EXAMINATION: ECOG PERFORMANCE STATUS: 2 - Symptomatic, <50% confined to bed  . Vitals:   09/14/20 0952  BP: 103/67  Pulse: 98  Resp: 18  Temp: 98 F (36.7 C)  SpO2: 100%    Filed Weights   09/14/20 0952  Weight: 202 lb 3.2 oz (91.7 kg)    .Body mass index is 27.42 kg/m.  NAD GENERAL:alert, in no acute distress and comfortable SKIN: no acute rashes, no significant lesions EYES: conjunctiva are pink and non-injected, sclera anicteric OROPHARYNX: MMM, no exudates, no oropharyngeal erythema or ulceration NECK: supple, no JVD LYMPH:  no palpable lymphadenopathy in the cervical, axillary or inguinal regions LUNGS: clear to auscultation b/l with normal respiratory effort HEART: regular rate & rhythm ABDOMEN:  normoactive bowel sounds , non tender, not distended. Extremity: no pedal edema PSYCH: alert & oriented x 3 with fluent speech NEURO: no focal motor/sensory deficits   LABORATORY DATA:  I have reviewed the data as listed   CBC Latest Ref Rng & Units 09/14/2020 08/24/2020 08/17/2020  WBC 4.0 - 10.5 K/uL 18.6(H) 23.7(H) 24.6(H)  Hemoglobin 13.0 - 17.0 g/dL 8.8(L) 9.8(L) 10.4(L)  Hematocrit 39.0 - 52.0 % 28.4(L) 31.3(L) 32.1(L)  Platelets 150 - 400 K/uL 284 241 235    . CMP Latest Ref Rng & Units 09/14/2020 08/24/2020 08/17/2020  Glucose 70 - 99 mg/dL 166(H) 238(H) 110(H)  BUN 8 - 23 mg/dL _0 Creatinine 0.61 - 1.24 mg/dL 1.27(H) 1.40(H) 1.21  Sodium 135 - 145 mmol/L 146(H) 144 144  Potassium 3.5 - 5.1 mmol/L 3.3(L) 3.6 3.3(L)  Chloride 98 - 111 mmol/L 109 107 102  CO2 22 - 32 mmol/L _1 Calcium 8.9 - 10.3 mg/dL 7.7(L) 7.3(L) 8.2(L)  Total Protein 6.5 - 8.1 g/dL 6.2(L) 6.6 6.8  Total Bilirubin 0.3 - 1.2 mg/dL 1.0 0.7 0.7  Alkaline Phos 38 - 126 U/L 131(H) 84 109   AST 15 - 41 U/L 26 17 14(L)  ALT 0 - 44 U/L 44 18 19   . Lab Results  Component Value Date   LDH 199 (H) 07/23/2020   SURGICAL PATHOLOGY   THIS IS AN ADDENDUM REPORT   CASE: [id number]  PATIENT: Hoyle Bundren  Surgical Pathology Report  Addendum    Reason for Addendum #1:  Molecular Genetic Test Results, FISH   Clinical History: Left testicular mass (crm)      FINAL MICROSCOPIC DIAGNOSIS:   A. TESTICLE, LEFT, ORCHIECTOMY:  -  Diffuse aggressive large B-cell lymphoma  -  See comment    COMMENT:   Sections of testicle show architectural effacement by sheets of large  lymphoid cells with vesicular nuclei and pale cytoplasm.  There is  admixed apoptotic debris and increased mitotic activity.  By  immunohistochemistry, the large lymphoid cells are positive for CD20,  CD5 (dim), BCL6 (dim), MUM1, and BCL2.  They are negative for CD10, CD30  (<1%), cyclin D1, and TdT.  The Ki67 proliferation index is up to  approximately 70%.  CD3 highlights small T-cells in the background. EBV  is negative by in situ hybridization.   Together, the findings support the diagnosis of a diffuse aggressive  large  B-cell lymphoma. The differential diagnosis includes a diffuse  large B-cell lymphoma, NOS, with activated B-cell subtype by the Hans  algorithm and high-grade B-cell lymphoma with MYC and BCL2 or BCL6  rearrangements.  FISH for BCL2, BCL6, and MYC rearrangements will be  performed. Correlation with clinical and radiographic findings is  recommended for consideration of a primary testicular lymphoma.   Result reported to L. Gibson on 03/16/20 at 1720 by S. O'Neill.    ADDENDUM:   FISH RESULTS:   Results: NORMAL   Interpretation:   BCL6 rearrangement:      Not Detected  MYC rearrangement:  Not Detected  MYC amplification:  Not Detected  BCL6 rearrangement:      Not Detected   Please note this testing was performed and interpreted by an outside  facility ([facility]).   This addendum is only being added to provide a  summary of the results for report completeness.  Please see electronic  medical record for a copy of the full report.   RADIOGRAPHIC STUDIES: I have personally reviewed the radiological images as listed and agreed with the findings in the report. MR PELVIS W WO CONTRAST  Result Date: 09/09/2020 CLINICAL DATA:  Continued scrotal pain status post left transinguinal orchiectomy on March 11, 2020 for primary testicular large B-cell lymphoma. EXAM: MRI PELVIS WITHOUT AND WITH CONTRAST TECHNIQUE: Multiplanar multisequence MR imaging of the pelvis was performed both before and after administration of intravenous contrast. CONTRAST:  18mL MULTIHANCE GADOBENATE DIMEGLUMINE 529 MG/ML IV SOLN COMPARISON:  Scrotal ultrasound July 23, 2020 and PET CT May 11, 2020. FINDINGS: Urinary Tract: Urinary bladder is unremarkable for degree of distension. Bowel:  Unremarkable visualized pelvic bowel loops. Vascular/Lymphatic: No pathologically enlarged lymph nodes. No significant vascular abnormality seen. Reproductive: Postsurgical change of left transinguinal orchiectomy. Within the left hemiscrotum there is a 1.8 x 1.5 x 1.4 cm T2 hyperintense collection with a T2 dark rim, which demonstrates heterogeneously increased T1 intrinsic internal T1 signal and a hyperintense T1 nodular rim. There is no evidence of suspicious postcontrast enhancement associated with this lesion. Additionally, there is no suspicious enhancing soft tissue nodularity Other:  No pelvic ascites. Musculoskeletal: Heterogeneous marrow signal without discrete suspicious bone lesions identified. IMPRESSION: Postsurgical change of left transinguinal orchiectomy with a 1.8 cm collection in the left hemiscrotum, which may reflect a postoperative seroma/hematoma or lymphocele. No evidence of suspicious postcontrast enhancement in the surgical bed to suggest local recurrence. Electronically Signed   By: Jeffrey   Waltz M.D.   On: 09/09/2020 09:56   DG FLUORO GUIDED LOC OF NEEDLE/CATH TIP FOR SPINAL INJECT LT  Result Date: 09/03/2020 CLINICAL DATA:  Lymphoma of the testicle EXAM: DIAGNOSTIC LUMBAR PUNCTURE UNDER FLUOROSCOPIC GUIDANCE WITH INTRATHECAL INJECTION COMPARISON:  05/11/2020 PET FLUOROSCOPY TIME:  Fluoroscopy Time:  3 minutes and 18 seconds Radiation Exposure Index (if provided by the fluoroscopic device): 66.3 mGy Number of Acquired Spot Images: 0 PROCEDURE: Informed consent was obtained from the patient prior to the procedure, including potential complications of headache, allergy, and pain. With the patient prone, the lower back was prepped with Betadine. 1% Lidocaine was used for local anesthesia. Lumbar puncture was performed at the L2-3 level using a 20 gauge needle. Despite appropriate needle positioning, CSF could not be obtained. Therefore, the L4-5 level was chosen. After appropriate anesthetic, a 20 gauge needle was inserted. Return of clear CSF. 9 ml of CSF were obtained for laboratory studies. Subsequently, slow injection of the methotrexate mixture as delivered by the pharmacy (12   mg methotrexate , 50 mg of Solu-Cortef, total 10 mm). The patient tolerated the procedure well and there were no apparent complications. IMPRESSION: Non complicated lumbar puncture and intrathecal chemotherapy injection as detailed above. Electronically Signed   By: Abigail Miyamoto M.D.   On: 09/03/2020 13:52    ASSESSMENT & PLAN:   75 year old wonderful gentleman with history of hypertension, borderline diabetes, dyslipidemia, GERD with recent STEMI on 03/21/2020 and status post four-vessel CABG on 03/25/2020.  1) Left-sided primary testicular large B-cell lymphoma. Staging to be determined. BCL-2, BCL 6, c-Myc negative.  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 -grade 1-2 fatigue but no other prohibitive toxicity from C2 of treatment -Received first dose of intrathecal methotrexate for CNS prophylaxis.  Had some nerve root irritation with left lower extremity discomfort lasting 1 to 2 days but now resolved.  She is willing to continue with second dose of intrathecal methotrexate. -We will need to monitor hemoglobin for transfusion needs. -will keep chemotherapy doses for C3 the same as C2 -continue G-CSF support -discussed and patient is agreeable to second dose of IT MTX for CNS prophylaxis which has been scheduled for 09/30/2020. -Continue follow-up with primary, urology and cardiology for continued management of other medical issues.  FOLLOW UP:  Portflush labs and MD visit in 10 days IT MTX as scheduled on 9/7 Plz schedule C5 of R-CEOP as per orders with portflush, labs and MD visit on C5D1    All of the patients questions were answered with apparent satisfaction. The patient knows to call the clinic with any problems, questions or concerns.  The total time spent in the appointment was 32 minutes and more than 50% was on counseling and direct patient cares.    Sullivan Lone MD [place] AAHIVMS [facility] Hematology/Oncology Physician [facility]  (Office):       [phone] (Work cell):  [phone] (Fax):           [phone]

## 2020-09-29 ENCOUNTER — Telehealth: Payer: Self-pay | Admitting: Cardiovascular Disease

## 2020-09-29 NOTE — Telephone Encounter (Signed)
Pt c/o swelling: STAT is pt has developed SOB within 24 hours  How much weight have you gained and in what time span? Pt have not gained any weight  If swelling, where is the swelling located?  Bilateral legs   Are you currently taking a fluid pill? Yes 80 mg lasix  Are you currently SOB? Little bit but not much. 3-4 days ago  Do you have a log of your daily weights (if so, list)? 197 lbs  Have you gained 3 pounds in a day or 5 pounds in a week? No weight gain as far as he knows   Have you traveled recently? No  Pt states if his wife answers his phone, you can tell her everything

## 2020-09-29 NOTE — Telephone Encounter (Signed)
Spoke to patient . Patient states  he has had swelling in legs for about one week.  The swelling does not go down complete at night. Patient states he is not wearing  compression socks.    Blood pressure now 112/75 pulse 105.  Weight  197 lb today   196-197 lbs yesterday     Patient stateshis range is between  195  to 200 lbs.   Patient states  has been taking Lasix 40 mg   for last 3 months or so.  Per protocol ,RN instructed patient to take 80 mg lasix tomorrow and next day  (09/30/20 and 10/01/20)    Patient states he does not take potassium ,((causes diarrhea)   Wear compression socks during the day  and take off a night. Patient verbalized understanding.patient states he will be having an epidural injection tomorrow - will start medication afterwards. Will call on Friday to give update

## 2020-09-30 ENCOUNTER — Other Ambulatory Visit: Payer: Self-pay

## 2020-09-30 ENCOUNTER — Telehealth: Payer: Self-pay

## 2020-09-30 ENCOUNTER — Ambulatory Visit (HOSPITAL_COMMUNITY)
Admission: RE | Admit: 2020-09-30 | Discharge: 2020-09-30 | Disposition: A | Discharge status: Home / Self Care | Payer: Medicare HMO | Source: Ambulatory Visit | Attending: Hematology | Admitting: Hematology

## 2020-09-30 ENCOUNTER — Encounter (HOSPITAL_COMMUNITY): Payer: Self-pay

## 2020-09-30 DIAGNOSIS — Z131 Encounter for screening for diabetes mellitus: Secondary | ICD-10-CM | POA: Insufficient documentation

## 2020-09-30 DIAGNOSIS — Z5111 Encounter for antineoplastic chemotherapy: Secondary | ICD-10-CM | POA: Diagnosis not present

## 2020-09-30 DIAGNOSIS — C8330 Diffuse large B-cell lymphoma, unspecified site: Secondary | ICD-10-CM | POA: Diagnosis not present

## 2020-09-30 DIAGNOSIS — Z7189 Other specified counseling: Secondary | ICD-10-CM | POA: Diagnosis not present

## 2020-09-30 DIAGNOSIS — C6290 Malignant neoplasm of unspecified testis, unspecified whether descended or undescended: Secondary | ICD-10-CM | POA: Diagnosis not present

## 2020-09-30 LAB — GLUCOSE, CAPILLARY: Glucose-Capillary: 213 mg/dL — ABNORMAL HIGH (ref 70–99)

## 2020-09-30 IMAGING — RF DG FLUORO GUIDE SPINAL/SI JT INJ*L*
1 series · 1 of 1 positions shown · non-contrast
Comparison: Comparison made with [DATE].

CLINICAL DATA: Testicular lymphoma.

EXAM:
DIAGNOSTIC LUMBAR PUNCTURE UNDER FLUOROSCOPIC GUIDANCE

[Series 1: cp_standard · 0.18mm/px · 1 of 1 slices shown]
[im 1/1]
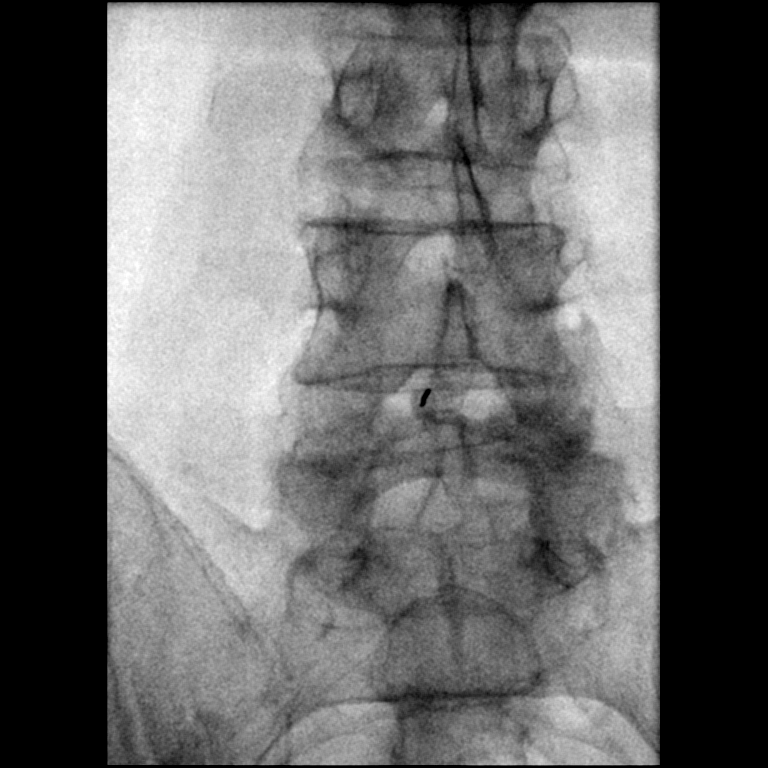

[1 of 1 positions shown; findings below may reference images not displayed]

FLUOROSCOPY TIME:  Fluoroscopy Time:  20 second

Radiation Exposure Index (if provided by the fluoroscopic device): 4
mGy

Number of Acquired Spot Images: 0

PROCEDURE:
Informed consent was obtained from the patient prior to the
procedure, including potential complications of headache, allergy,
and pain. With the patient prone, the lower back was prepped with
Betadine. 1% Lidocaine was used for local anesthesia. Lumbar
puncture was performed at the L4-L5 level using a 20 gauge needle
with return of clear CSF with 3 mL of clear CSF was removed for
flushing of tubing after medication administration. Intrathecal
medication, methotrexate mixture as delivered from the pharmacy (12
mg methotrexate, 50 mg of Solu-Cortef) was delivered with slow
injection. The tubing was flushed with 2 mL of sterile CSF obtained
after access.

The patient tolerated the procedure well. There were no apparent
complications.
IMPRESSION: Technically successful delivery of intrathecal chemotherapy as
outlined above.

## 2020-09-30 MED ORDER — LIDOCAINE HCL (PF) 1 % IJ SOLN
5.0000 mL | Freq: Once | INTRAMUSCULAR | Status: AC
  Administered 2020-09-30: 5 mL via INTRADERMAL

## 2020-09-30 MED ORDER — SODIUM CHLORIDE (PF) 0.9 % IJ SOLN
Freq: Once | INTRAMUSCULAR | Status: AC
  Filled 2020-09-30: qty 0.48

## 2020-09-30 NOTE — Progress Notes (Signed)
Called patients wife and updated her on patient situation.  Will call her back when patient is ready & she can get the car.

## 2020-09-30 NOTE — Progress Notes (Signed)
2 hours post intrathecal chemo.  Flat in bed, lights out, has been resting quietly. Urinal emptied,200 ml.  Call bell within reach.

## 2020-09-30 NOTE — Progress Notes (Signed)
Vital signs remain stable, patient feels no worse.  Patient states he is hungry. Patient just ate 2 packs of graham crackers, advised him to get home and eat something a little more substantial.  Patient voiced understanding.  Patient dressed himself and I took patient to the rest room before taking him out front to meet his wife.  Wife is driving.  Advised wife and patient if symptoms worsened to call Dr. Irene Limbo and go to the ER.  Both voiced understanding.

## 2020-09-30 NOTE — Procedures (Signed)
After time out, the L4-5 level was accessed using sterile technique with a 20 gauge spinal needle with flow of clear CSF. 3 cc removed for tube flushing following medication administration.  The chemotherapy mixture was injected as outlined in the report after confirmation of medication and patient during second time out.   The tubing and needle were flushed with 2 cc's of sterile CSF.  No immediate complications were noted.

## 2020-09-30 NOTE — Progress Notes (Addendum)
Called to room by patient, patient states he has some heaviness in his chest and feels like he is not moving air well in his chest.  Patient states he has no pain.  Patient states I feel like theres something stuck in the my throat.  VSS, afebrile.  Charted in epic.   Patient has been on bedrest for 3 hours, so advised patient to sit on the side of the bed and cough. Patient did so and was able to cough up some phlegm.  Vital signs remained stable during this episode.  Patient stayed sitting on the side of the bed for about 10 minutes.  After coughing and spitting up some phlegm he states he feels much better.  Patient laid back down to finish his bedrest.  Pillows adjusted to patient liking. Called Dr. Jacalyn Lefevre to inform him of this episode, he states to notify Dr.Kale and if I can't reach anyone to call him back. Called Dr.Kale's office and spoke to [place], she said she will let Dr.Kale know but if the patient feels better to continue to proceed with original d/c plans.  She states to call back if episode returns or patient feels worse.

## 2020-09-30 NOTE — Progress Notes (Signed)
Patient lying flat, feels about the same.  No worse.   Vital signs remain stable.  Patient c/o feeling hungry and empty.  Offered some crackers and water refill.  Patient wants graham crackers and said his water is fine.  Told patient to sit on side of bed and eat his snack and I'd be back in about 10 minutes.   Patient voiced understanding. Call bell within reach.

## 2020-09-30 NOTE — Progress Notes (Signed)
Called wife, informed her to get the car and pull around to main entrance. She voiced understanding.

## 2020-09-30 NOTE — Progress Notes (Signed)
Patient is 1 hour post intrathecal chemo.  Flat in bed, lights dimmed, has been resting quietly. Gave patient another blanket and placed a urinal at the bedside at his request.  Patient states he does not require anything else right now.  Call bell within reach.

## 2020-09-30 NOTE — Telephone Encounter (Signed)
Returned call to wife per her request. Wife states pt has vomited several times on the way home from intrathecal chemo tx done today. Pt remains nauseated. Discussed antiemetic options and encouraged pt to take something now. Pt has compazine and zofran. Pt agreed and was instructed to call if n/v got worse or for any other issues. Pt and wife verbalized understanding.

## 2020-10-02 ENCOUNTER — Other Ambulatory Visit: Payer: Self-pay

## 2020-10-02 ENCOUNTER — Telehealth: Payer: Self-pay | Admitting: Cardiovascular Disease

## 2020-10-02 DIAGNOSIS — C8330 Diffuse large B-cell lymphoma, unspecified site: Secondary | ICD-10-CM

## 2020-10-02 DIAGNOSIS — Z79899 Other long term (current) drug therapy: Secondary | ICD-10-CM

## 2020-10-02 NOTE — Telephone Encounter (Signed)
Croitoru, Dani Gobble, MD  Fidel Levy, RN 23 minutes ago (5:18 PM)   I think best to continue the current furosemide dose through the weekend, since he still has dyspnea.    Returned call to patient. Made patient aware of Dr. Victorino December recommendations. Advised patient to come have BMET drawn at the beginning of next week. Patient states he will come on Tuesday for lab work. Advised patient to call back to office with any issues, questions, or concerns. Patient verbalized understanding.

## 2020-10-02 NOTE — Telephone Encounter (Signed)
Patient called to get information to [facility] nurse for Dr. [name]

## 2020-10-02 NOTE — Telephone Encounter (Signed)
Patient of Dr. [name] calling in to report on status since increasing lasix '80mg'$  - took 9/8 and 9/9 (today) wearing compression stockings -- swelling improved. He still reports shortness of breath, had this on 9/6 during phone call with Michele Rockers also. He did not check BP this morning - last evening was 115/72. Weight is 195-200lbs. He has not noticed an acute increase in weight.   He reports the dose of lasix was previously decreased from '80mg'$  to '40mg'$  d/t renal function Last CMP was 8/22 [facility] ordered CBC, CMP and he has an appointment with them on Monday   Will send to Dr O'Neal/DOD to advise if he should continue lasix '80mg'$  daily or decrease dose

## 2020-10-05 ENCOUNTER — Other Ambulatory Visit: Payer: Self-pay

## 2020-10-05 ENCOUNTER — Inpatient Hospital Stay: Payer: Medicare HMO

## 2020-10-05 ENCOUNTER — Inpatient Hospital Stay: Payer: Medicare HMO | Attending: Hematology

## 2020-10-05 ENCOUNTER — Inpatient Hospital Stay (HOSPITAL_BASED_OUTPATIENT_CLINIC_OR_DEPARTMENT_OTHER): Payer: Medicare HMO | Admitting: Hematology

## 2020-10-05 VITALS — BP 103/68 | HR 91 | Temp 97.7°F | Resp 18 | Wt 207.0 lb

## 2020-10-05 DIAGNOSIS — C8330 Diffuse large B-cell lymphoma, unspecified site: Secondary | ICD-10-CM | POA: Diagnosis not present

## 2020-10-05 DIAGNOSIS — E876 Hypokalemia: Secondary | ICD-10-CM

## 2020-10-05 DIAGNOSIS — Z5111 Encounter for antineoplastic chemotherapy: Secondary | ICD-10-CM | POA: Diagnosis not present

## 2020-10-05 DIAGNOSIS — Z7189 Other specified counseling: Secondary | ICD-10-CM

## 2020-10-05 DIAGNOSIS — Z5112 Encounter for antineoplastic immunotherapy: Secondary | ICD-10-CM | POA: Diagnosis not present

## 2020-10-05 DIAGNOSIS — C8335 Diffuse large B-cell lymphoma, lymph nodes of inguinal region and lower limb: Secondary | ICD-10-CM | POA: Insufficient documentation

## 2020-10-05 DIAGNOSIS — Z79899 Other long term (current) drug therapy: Secondary | ICD-10-CM | POA: Insufficient documentation

## 2020-10-05 DIAGNOSIS — Z5189 Encounter for other specified aftercare: Secondary | ICD-10-CM | POA: Diagnosis not present

## 2020-10-05 DIAGNOSIS — Z95828 Presence of other vascular implants and grafts: Secondary | ICD-10-CM

## 2020-10-05 LAB — CBC WITH DIFFERENTIAL (CANCER CENTER ONLY)
Abs Immature Granulocytes: 0.11 10*3/uL — ABNORMAL HIGH (ref 0.00–0.07)
Basophils Absolute: 0.1 10*3/uL (ref 0.0–0.1)
Basophils Relative: 1 %
Eosinophils Absolute: 0 10*3/uL (ref 0.0–0.5)
Eosinophils Relative: 0 %
HCT: 28.7 % — ABNORMAL LOW (ref 39.0–52.0)
Hemoglobin: 9.1 g/dL — ABNORMAL LOW (ref 13.0–17.0)
Immature Granulocytes: 1 %
Lymphocytes Relative: 5 %
Lymphs Abs: 1 10*3/uL (ref 0.7–4.0)
MCH: 27.7 pg (ref 26.0–34.0)
MCHC: 31.7 g/dL (ref 30.0–36.0)
MCV: 87.2 fL (ref 80.0–100.0)
Monocytes Absolute: 0.1 10*3/uL (ref 0.1–1.0)
Monocytes Relative: 1 %
Neutro Abs: 16.6 10*3/uL — ABNORMAL HIGH (ref 1.7–7.7)
Neutrophils Relative %: 92 %
Platelet Count: 263 10*3/uL (ref 150–400)
RBC: 3.29 MIL/uL — ABNORMAL LOW (ref 4.22–5.81)
RDW: 18 % — ABNORMAL HIGH (ref 11.5–15.5)
WBC Count: 17.9 10*3/uL — ABNORMAL HIGH (ref 4.0–10.5)
nRBC: 0.1 % (ref 0.0–0.2)

## 2020-10-05 LAB — CMP (CANCER CENTER ONLY)
ALT: 50 U/L — ABNORMAL HIGH (ref 0–44)
AST: 27 U/L (ref 15–41)
Albumin: 3.3 g/dL — ABNORMAL LOW (ref 3.5–5.0)
Alkaline Phosphatase: 131 U/L — ABNORMAL HIGH (ref 38–126)
Anion gap: 16 — ABNORMAL HIGH (ref 5–15)
BUN: 20 mg/dL (ref 8–23)
CO2: 22 mmol/L (ref 22–32)
Calcium: 7.3 mg/dL — ABNORMAL LOW (ref 8.9–10.3)
Chloride: 106 mmol/L (ref 98–111)
Creatinine: 1.37 mg/dL — ABNORMAL HIGH (ref 0.61–1.24)
GFR, Estimated: 53 mL/min — ABNORMAL LOW (ref >60)
Glucose, Bld: 200 mg/dL — ABNORMAL HIGH (ref 70–99)
Potassium: 3 mmol/L — ABNORMAL LOW (ref 3.5–5.1)
Sodium: 144 mmol/L (ref 135–145)
Total Bilirubin: 1.1 mg/dL (ref 0.3–1.2)
Total Protein: 6 g/dL — ABNORMAL LOW (ref 6.5–8.1)

## 2020-10-05 MED ORDER — POTASSIUM CHLORIDE CRYS ER 20 MEQ PO TBCR
40.0000 meq | EXTENDED_RELEASE_TABLET | Freq: Once | ORAL | Status: AC
  Administered 2020-10-05: 40 meq via ORAL
  Filled 2020-10-05: qty 2

## 2020-10-05 MED ORDER — SODIUM CHLORIDE 0.9 % IV SOLN
Freq: Once | INTRAVENOUS | Status: AC
Start: 2020-10-05 — End: 2020-10-05

## 2020-10-05 MED ORDER — POTASSIUM CHLORIDE 20 MEQ PO PACK
40.0000 meq | PACK | Freq: Once | ORAL | Status: DC
  Filled 2020-10-05: qty 2

## 2020-10-05 MED ORDER — SODIUM CHLORIDE 0.9 % IV SOLN
1.0000 mg | Freq: Once | INTRAVENOUS | Status: AC
  Administered 2020-10-05: 1 mg via INTRAVENOUS
  Filled 2020-10-05: qty 1

## 2020-10-05 MED ORDER — SODIUM CHLORIDE 0.9 % IV SOLN
35.0000 mg/m2 | Freq: Once | INTRAVENOUS | Status: AC
  Administered 2020-10-05: 80 mg via INTRAVENOUS
  Filled 2020-10-05: qty 4

## 2020-10-05 MED ORDER — DIPHENHYDRAMINE HCL 25 MG PO CAPS
50.0000 mg | ORAL_CAPSULE | Freq: Once | ORAL | Status: AC
  Administered 2020-10-05: 50 mg via ORAL
  Filled 2020-10-05: qty 2

## 2020-10-05 MED ORDER — POTASSIUM CHLORIDE CRYS ER 20 MEQ PO TBCR: 40.0000 meq | EXTENDED_RELEASE_TABLET | Freq: Once | ORAL | Status: DC

## 2020-10-05 MED ORDER — PREDNISONE 50 MG PO TABS: 100.0000 mg | ORAL_TABLET | Freq: Once | ORAL | Status: DC

## 2020-10-05 MED ORDER — PALONOSETRON HCL INJECTION 0.25 MG/5ML
0.2500 mg | Freq: Once | INTRAVENOUS | Status: AC
  Administered 2020-10-05: 0.25 mg via INTRAVENOUS
  Filled 2020-10-05: qty 5

## 2020-10-05 MED ORDER — SODIUM CHLORIDE 0.9 % IV SOLN
400.0000 mg/m2 | Freq: Once | INTRAVENOUS | Status: AC
  Administered 2020-10-05: 860 mg via INTRAVENOUS
  Filled 2020-10-05: qty 43

## 2020-10-05 MED ORDER — SODIUM CHLORIDE 0.9 % IV SOLN: 35.0000 mg/m2 | Freq: Once | INTRAVENOUS | Status: DC

## 2020-10-05 MED ORDER — SODIUM CHLORIDE 0.9 % IV SOLN
375.0000 mg/m2 | Freq: Once | INTRAVENOUS | Status: AC
  Administered 2020-10-05: 800 mg via INTRAVENOUS
  Filled 2020-10-05: qty 50

## 2020-10-05 MED ORDER — ACETAMINOPHEN 325 MG PO TABS
650.0000 mg | ORAL_TABLET | Freq: Once | ORAL | Status: AC
  Administered 2020-10-05: 650 mg via ORAL
  Filled 2020-10-05: qty 2

## 2020-10-05 MED ORDER — SODIUM CHLORIDE 0.9% FLUSH
10.0000 mL | Freq: Once | INTRAVENOUS | Status: AC
  Administered 2020-10-05: 10 mL

## 2020-10-05 MED ORDER — SODIUM CHLORIDE 0.9 % IV SOLN
10.0000 mg | Freq: Once | INTRAVENOUS | Status: AC
  Administered 2020-10-05: 10 mg via INTRAVENOUS
  Filled 2020-10-05: qty 10

## 2020-10-05 MED ORDER — SODIUM CHLORIDE 0.9% FLUSH
10.0000 mL | INTRAVENOUS | Status: DC | PRN
  Administered 2020-10-05: 10 mL

## 2020-10-05 MED ORDER — HEPARIN SOD (PORK) LOCK FLUSH 100 UNIT/ML IV SOLN
500.0000 [IU] | Freq: Once | INTRAVENOUS | Status: AC | PRN
  Administered 2020-10-05: 500 [IU]

## 2020-10-05 NOTE — Patient Instructions (Signed)
[facility]  Discharge Instructions: Thank you for choosing [facility] to provide your oncology and hematology care.   If you have a lab appointment with the [facility], please go directly to the [facility] and check in at the registration area.   Wear comfortable clothing and clothing appropriate for easy access to any Portacath or PICC line.   We strive to give you quality time with your provider. You may need to reschedule your appointment if you arrive late (15 or more minutes).  Arriving late affects you and other patients whose appointments are after yours.  Also, if you miss three or more appointments without notifying the office, you may be dismissed from the clinic at the provider's discretion.      For prescription refill requests, have your pharmacy contact our office and allow 72 hours for refills to be completed.    Today you received the following chemotherapy and/or immunotherapy agents Vincristine, Cytoxan, Etoposide & Rituxan      To help prevent nausea and vomiting after your treatment, we encourage you to take your nausea medication as directed.  BELOW ARE SYMPTOMS THAT SHOULD BE REPORTED IMMEDIATELY: *FEVER GREATER THAN 100.4 F (38 C) OR HIGHER *CHILLS OR SWEATING *NAUSEA AND VOMITING THAT IS NOT CONTROLLED WITH YOUR NAUSEA MEDICATION *UNUSUAL SHORTNESS OF BREATH *UNUSUAL BRUISING OR BLEEDING *URINARY PROBLEMS (pain or burning when urinating, or frequent urination) *BOWEL PROBLEMS (unusual diarrhea, constipation, pain near the anus) TENDERNESS IN MOUTH AND THROAT WITH OR WITHOUT PRESENCE OF ULCERS (sore throat, sores in mouth, or a toothache) UNUSUAL RASH, SWELLING OR PAIN  UNUSUAL VAGINAL DISCHARGE OR ITCHING   Items with * indicate a potential emergency and should be followed up as soon as possible or go to the Emergency Department if any problems should occur.  Please show the CHEMOTHERAPY ALERT CARD or  IMMUNOTHERAPY ALERT CARD at check-in to the Emergency Department and triage nurse.  Should you have questions after your visit or need to cancel or reschedule your appointment, please contact [facility]  Dept: [phone]  and follow the prompts.  Office hours are 8:00 a.m. to 4:30 p.m. Monday - Friday. Please note that voicemails left after 4:00 p.m. may not be returned until the following business day.  We are closed weekends and major holidays. You have access to a nurse at all times for urgent questions. Please call the main number to the clinic Dept: [phone] and follow the prompts.   For any non-urgent questions, you may also contact your provider using MyChart. We now offer e-Visits for anyone 17 and older to request care online for non-urgent symptoms. For details visit [email or web address].   Also download the MyChart app! Go to the app store, search "MyChart", open the app, select [facility], and log in with your MyChart username and password.  Due to Covid, a mask is required upon entering the hospital/clinic. If you do not have a mask, one will be given to you upon arrival. For doctor visits, patients may have 1 support person aged 22 or older with them. For treatment visits, patients cannot have anyone with them due to current Covid guidelines and our immunocompromised population.   Vincristine injection What is this medication? VINCRISTINE (vin KRIS teen) is a chemotherapy drug. It slows the growth of cancer cells. This medicine is used to treat many types of cancer like Hodgkin's disease, leukemia, non-Hodgkin's lymphoma, neuroblastoma (brain cancer), rhabdomyosarcoma, and Wilms' tumor. This  medicine may be used for other purposes; ask your health care provider or pharmacist if you have questions. COMMON BRAND NAME(S): Oncovin, Vincasar PFS What should I tell my care team before I take this medication? They need to know if you have any of these  conditions: blood disorders gout infection (especially chickenpox, cold sores, or herpes) kidney disease liver disease lung disease nervous system disease like Charcot-Marie-Tooth (CMT) recent or ongoing radiation therapy an unusual or allergic reaction to vincristine, other chemotherapy agents, other medicines, foods, dyes, or preservatives pregnant or trying to get pregnant breast-feeding How should I use this medication? This drug is given as an infusion into a vein. It is administered in a hospital or clinic by a specially trained health care professional. If you have pain, swelling, burning, or any unusual feeling around the site of your injection, tell your health care professional right away. Talk to your pediatrician regarding the use of this medicine in children. While this drug may be prescribed for selected conditions, precautions do apply. Overdosage: If you think you have taken too much of this medicine contact a poison control center or emergency room at once. NOTE: This medicine is only for you. Do not share this medicine with others. What if I miss a dose? It is important not to miss your dose. Call your doctor or health care professional if you are unable to keep an appointment. What may interact with this medication? certain medicines for fungal infections like itraconazole, ketoconazole, posaconazole, voriconazole certain medicines for seizures like phenytoin This list may not describe all possible interactions. Give your health care provider a list of all the medicines, herbs, non-prescription drugs, or dietary supplements you use. Also tell them if you smoke, drink alcohol, or use illegal drugs. Some items may interact with your medicine. What should I watch for while using this medication? This drug may make you feel generally unwell. This is not uncommon, as chemotherapy can affect healthy cells as well as cancer cells. Report any side effects. Continue your course of  treatment even though you feel ill unless your doctor tells you to stop. You may need blood work done while you are taking this medicine. This medicine will cause constipation. Try to have a bowel movement at least every 2 to 3 days. If you do not have a bowel movement for 3 days, call your doctor or health care professional. In some cases, you may be given additional medicines to help with side effects. Follow all directions for their use. Do not become pregnant while taking this medicine. Women should inform their doctor if they wish to become pregnant or think they might be pregnant. There is a potential for serious side effects to an unborn child. Talk to your health care professional or pharmacist for more information. Do not breast-feed an infant while taking this medicine. This medicine may make it more difficult to get pregnant or to father a child. Talk to your healthcare professional if you are concerned about your fertility. What side effects may I notice from receiving this medication? Side effects that you should report to your doctor or health care professional as soon as possible: allergic reactions like skin rash, itching or hives, swelling of the face, lips, or tongue breathing problems confusion or changes in emotions or moods constipation cough mouth sores muscle weakness nausea and vomiting pain, swelling, redness or irritation at the injection site pain, tingling, numbness in the hands or feet problems with balance, talking, walking seizures stomach pain  trouble passing urine or change in the amount of urine Side effects that usually do not require medical attention (report to your doctor or health care professional if they continue or are bothersome): diarrhea hair loss jaw pain loss of appetite This list may not describe all possible side effects. Call your doctor for medical advice about side effects. You may report side effects to FDA at 1-800-FDA-1088. Where  should I keep my medication? This drug is given in a hospital or clinic and will not be stored at home. NOTE: This sheet is a summary. It may not cover all possible information. If you have questions about this medicine, talk to your doctor, pharmacist, or health care provider.  2022 Elsevier/Gold Standard (2018-12-11 17:05:13)  Cyclophosphamide Injection What is this medication? CYCLOPHOSPHAMIDE (sye kloe FOSS fa mide) is a chemotherapy drug. It slows the growth of cancer cells. This medicine is used to treat many types of cancer like lymphoma, myeloma, leukemia, breast cancer, and ovarian cancer, to name a few. This medicine may be used for other purposes; ask your health care provider or pharmacist if you have questions. COMMON BRAND NAME(S): Cytoxan, Neosar What should I tell my care team before I take this medication? They need to know if you have any of these conditions: heart disease history of irregular heartbeat infection kidney disease liver disease low blood counts, like white cells, platelets, or red blood cells on hemodialysis recent or ongoing radiation therapy scarring or thickening of the lungs trouble passing urine an unusual or allergic reaction to cyclophosphamide, other medicines, foods, dyes, or preservatives pregnant or trying to get pregnant breast-feeding How should I use this medication? This drug is usually given as an injection into a vein or muscle or by infusion into a vein. It is administered in a hospital or clinic by a specially trained health care professional. Talk to your pediatrician regarding the use of this medicine in children. Special care may be needed. Overdosage: If you think you have taken too much of this medicine contact a poison control center or emergency room at once. NOTE: This medicine is only for you. Do not share this medicine with others. What if I miss a dose? It is important not to miss your dose. Call your doctor or health care  professional if you are unable to keep an appointment. What may interact with this medication? amphotericin B azathioprine certain antivirals for HIV or hepatitis certain medicines for blood pressure, heart disease, irregular heart beat certain medicines that treat or prevent blood clots like warfarin certain other medicines for cancer cyclosporine etanercept indomethacin medicines that relax muscles for surgery medicines to increase blood counts metronidazole This list may not describe all possible interactions. Give your health care provider a list of all the medicines, herbs, non-prescription drugs, or dietary supplements you use. Also tell them if you smoke, drink alcohol, or use illegal drugs. Some items may interact with your medicine. What should I watch for while using this medication? Your condition will be monitored carefully while you are receiving this medicine. You may need blood work done while you are taking this medicine. Drink water or other fluids as directed. Urinate often, even at night. Some products may contain alcohol. Ask your health care professional if this medicine contains alcohol. Be sure to tell all health care professionals you are taking this medicine. Certain medicines, like metronidazole and disulfiram, can cause an unpleasant reaction when taken with alcohol. The reaction includes flushing, headache, nausea, vomiting, sweating, and increased  thirst. The reaction can last from 30 minutes to several hours. Do not become pregnant while taking this medicine or for 1 year after stopping it. Women should inform their health care professional if they wish to become pregnant or think they might be pregnant. Men should not father a child while taking this medicine and for 4 months after stopping it. There is potential for serious side effects to an unborn child. Talk to your health care professional for more information. Do not breast-feed an infant while taking this  medicine or for 1 week after stopping it. This medicine has caused ovarian failure in some women. This medicine may make it more difficult to get pregnant. Talk to your health care professional if you are concerned about your fertility. This medicine has caused decreased sperm counts in some men. This may make it more difficult to father a child. Talk to your health care professional if you are concerned about your fertility. Call your health care professional for advice if you get a fever, chills, or sore throat, or other symptoms of a cold or flu. Do not treat yourself. This medicine decreases your body's ability to fight infections. Try to avoid being around people who are sick. Avoid taking medicines that contain aspirin, acetaminophen, ibuprofen, naproxen, or ketoprofen unless instructed by your health care professional. These medicines may hide a fever. Talk to your health care professional about your risk of cancer. You may be more at risk for certain types of cancer if you take this medicine. If you are going to need surgery or other procedure, tell your health care professional that you are using this medicine. Be careful brushing or flossing your teeth or using a toothpick because you may get an infection or bleed more easily. If you have any dental work done, tell your dentist you are receiving this medicine. What side effects may I notice from receiving this medication? Side effects that you should report to your doctor or health care professional as soon as possible: allergic reactions like skin rash, itching or hives, swelling of the face, lips, or tongue breathing problems nausea, vomiting signs and symptoms of bleeding such as bloody or black, tarry stools; red or dark brown urine; spitting up blood or brown material that looks like coffee grounds; red spots on the skin; unusual bruising or bleeding from the eyes, gums, or nose signs and symptoms of heart failure like fast, irregular  heartbeat, sudden weight gain; swelling of the ankles, feet, hands signs and symptoms of infection like fever; chills; cough; sore throat; pain or trouble passing urine signs and symptoms of kidney injury like trouble passing urine or change in the amount of urine signs and symptoms of liver injury like dark yellow or brown urine; general ill feeling or flu-like symptoms; light-colored stools; loss of appetite; nausea; right upper belly pain; unusually weak or tired; yellowing of the eyes or skin Side effects that usually do not require medical attention (report to your doctor or health care professional if they continue or are bothersome): confusion decreased hearing diarrhea facial flushing hair loss headache loss of appetite missed menstrual periods signs and symptoms of low red blood cells or anemia such as unusually weak or tired; feeling faint or lightheaded; falls skin discoloration This list may not describe all possible side effects. Call your doctor for medical advice about side effects. You may report side effects to FDA at 1-800-FDA-1088. Where should I keep my medication? This drug is given in a hospital or  clinic and will not be stored at home. NOTE: This sheet is a summary. It may not cover all possible information. If you have questions about this medicine, talk to your doctor, pharmacist, or health care provider.  2022 Elsevier/Gold Standard (2018-10-15 09:53:29)  Etoposide, VP-16 injection What is this medication? ETOPOSIDE, VP-16 (e toe POE side) is a chemotherapy drug. It is used to treat testicular cancer, lung cancer, and other cancers. This medicine may be used for other purposes; ask your health care provider or pharmacist if you have questions. COMMON BRAND NAME(S): Etopophos, Toposar, VePesid What should I tell my care team before I take this medication? They need to know if you have any of these conditions: infection kidney disease liver disease low blood  counts, like low white cell, platelet, or red cell counts an unusual or allergic reaction to etoposide, other medicines, foods, dyes, or preservatives pregnant or trying to get pregnant breast-feeding How should I use this medication? This medicine is for infusion into a vein. It is administered in a hospital or clinic by a specially trained health care professional. Talk to your pediatrician regarding the use of this medicine in children. Special care may be needed. Overdosage: If you think you have taken too much of this medicine contact a poison control center or emergency room at once. NOTE: This medicine is only for you. Do not share this medicine with others. What if I miss a dose? It is important not to miss your dose. Call your doctor or health care professional if you are unable to keep an appointment. What may interact with this medication? This medicine may interact with the following medications: warfarin This list may not describe all possible interactions. Give your health care provider a list of all the medicines, herbs, non-prescription drugs, or dietary supplements you use. Also tell them if you smoke, drink alcohol, or use illegal drugs. Some items may interact with your medicine. What should I watch for while using this medication? Visit your doctor for checks on your progress. This drug may make you feel generally unwell. This is not uncommon, as chemotherapy can affect healthy cells as well as cancer cells. Report any side effects. Continue your course of treatment even though you feel ill unless your doctor tells you to stop. In some cases, you may be given additional medicines to help with side effects. Follow all directions for their use. Call your doctor or health care professional for advice if you get a fever, chills or sore throat, or other symptoms of a cold or flu. Do not treat yourself. This drug decreases your body's ability to fight infections. Try to avoid being  around people who are sick. This medicine may increase your risk to bruise or bleed. Call your doctor or health care professional if you notice any unusual bleeding. Talk to your doctor about your risk of cancer. You may be more at risk for certain types of cancers if you take this medicine. Do not become pregnant while taking this medicine or for at least 6 months after stopping it. Women should inform their doctor if they wish to become pregnant or think they might be pregnant. Women of child-bearing potential will need to have a negative pregnancy test before starting this medicine. There is a potential for serious side effects to an unborn child. Talk to your health care professional or pharmacist for more information. Do not breast-feed an infant while taking this medicine. Men must use a latex condom during sexual contact  with a woman while taking this medicine and for at least 4 months after stopping it. A latex condom is needed even if you have had a vasectomy. Contact your doctor right away if your partner becomes pregnant. Do not donate sperm while taking this medicine and for at least 4 months after you stop taking this medicine. Men should inform their doctors if they wish to father a child. This medicine may lower sperm counts. What side effects may I notice from receiving this medication? Side effects that you should report to your doctor or health care professional as soon as possible: allergic reactions like skin rash, itching or hives, swelling of the face, lips, or tongue low blood counts - this medicine may decrease the number of white blood cells, red blood cells, and platelets. You may be at increased risk for infections and bleeding nausea, vomiting redness, blistering, peeling or loosening of the skin, including inside the mouth signs and symptoms of infection like fever; chills; cough; sore throat; pain or trouble passing urine signs and symptoms of low red blood cells or anemia  such as unusually weak or tired; feeling faint or lightheaded; falls; breathing problems unusual bruising or bleeding Side effects that usually do not require medical attention (report to your doctor or health care professional if they continue or are bothersome): changes in taste diarrhea hair loss loss of appetite mouth sores This list may not describe all possible side effects. Call your doctor for medical advice about side effects. You may report side effects to FDA at 1-800-FDA-1088. Where should I keep my medication? This drug is given in a hospital or clinic and will not be stored at home. NOTE: This sheet is a summary. It may not cover all possible information. If you have questions about this medicine, talk to your doctor, pharmacist, or health care provider.  2022 Elsevier/Gold Standard (2018-03-07 16:57:15)  Rituximab Injection What is this medication? RITUXIMAB (ri TUX i mab) is a monoclonal antibody. It is used to treat certain types of cancer like non-Hodgkin lymphoma and chronic lymphocytic leukemia. It is also used to treat rheumatoid arthritis, granulomatosis with polyangiitis, microscopic polyangiitis, and pemphigus vulgaris. This medicine may be used for other purposes; ask your health care provider or pharmacist if you have questions. COMMON BRAND NAME(S): RIABNI, Rituxan, RUXIENCE What should I tell my care team before I take this medication? They need to know if you have any of these conditions: chest pain heart disease infection especially a viral infection such as chickenpox, cold sores, hepatitis B, or herpes immune system problems irregular heartbeat or rhythm kidney disease low blood counts (white cells, platelets, or red cells) lung disease recent or upcoming vaccine an unusual or allergic reaction to rituximab, other medicines, foods, dyes, or preservatives pregnant or trying to get pregnant breast-feeding How should I use this medication? This medicine  is injected into a vein. It is given by a health care provider in a hospital or clinic setting. A special MedGuide will be given to you before each treatment. Be sure to read this information carefully each time. Talk to your health care provider about the use of this medicine in children. While this drug may be prescribed for children as young as 6 months for selected conditions, precautions do apply. Overdosage: If you think you have taken too much of this medicine contact a poison control center or emergency room at once. NOTE: This medicine is only for you. Do not share this medicine with others. What if  I miss a dose? Keep appointments for follow-up doses. It is important not to miss your dose. Call your health care provider if you are unable to keep an appointment. What may interact with this medication? Do not take this medicine with any of the following medicines: live vaccines This medicine may also interact with the following medicines: cisplatin This list may not describe all possible interactions. Give your health care provider a list of all the medicines, herbs, non-prescription drugs, or dietary supplements you use. Also tell them if you smoke, drink alcohol, or use illegal drugs. Some items may interact with your medicine. What should I watch for while using this medication? Your condition will be monitored carefully while you are receiving this medicine. You may need blood work done while you are taking this medicine. This medicine can cause serious infusion reactions. To reduce the risk your health care provider may give you other medicines to take before receiving this one. Be sure to follow the directions from your health care provider. This medicine may increase your risk of getting an infection. Call your health care provider for advice if you get a fever, chills, sore throat, or other symptoms of a cold or flu. Do not treat yourself. Try to avoid being around people who are  sick. Call your health care provider if you are around anyone with measles, chickenpox, or if you develop sores or blisters that do not heal properly. Avoid taking medicines that contain aspirin, acetaminophen, ibuprofen, naproxen, or ketoprofen unless instructed by your health care provider. These medicines may hide a fever. This medicine may cause serious skin reactions. They can happen weeks to months after starting the medicine. Contact your health care provider right away if you notice fevers or flu-like symptoms with a rash. The rash may be red or purple and then turn into blisters or peeling of the skin. Or, you might notice a red rash with swelling of the face, lips or lymph nodes in your neck or under your arms. In some patients, this medicine may cause a serious brain infection that may cause death. If you have any problems seeing, thinking, speaking, walking, or standing, tell your healthcare professional right away. If you cannot reach your healthcare professional, urgently seek other source of medical care. Do not become pregnant while taking this medicine or for at least 12 months after stopping it. Women should inform their health care provider if they wish to become pregnant or think they might be pregnant. There is potential for serious harm to an unborn child. Talk to your health care provider for more information. Women should use a reliable form of birth control while taking this medicine and for 12 months after stopping it. Do not breast-feed while taking this medicine or for at least 6 months after stopping it. What side effects may I notice from receiving this medication? Side effects that you should report to your health care provider as soon as possible: allergic reactions (skin rash, itching or hives; swelling of the face, lips, or tongue) diarrhea edema (sudden weight gain; swelling of the ankles, feet, hands or other unusual swelling; trouble breathing) fast, irregular  heartbeat heart attack (trouble breathing; pain or tightness in the chest, neck, back or arms; unusually weak or tired) infection (fever, chills, cough, sore throat, pain or trouble passing urine) kidney injury (trouble passing urine or change in the amount of urine) liver injury (dark yellow or brown urine; general ill feeling or flu-like symptoms; loss of appetite, right  upper belly pain; unusually weak or tired, yellowing of the eyes or skin) low blood pressure (dizziness; feeling faint or lightheaded, falls; unusually weak or tired) low red blood cell counts (trouble breathing; feeling faint; lightheaded, falls; unusually weak or tired) mouth sores redness, blistering, peeling, or loosening of the skin, including inside the mouth stomach pain unusual bruising or bleeding wheezing (trouble breathing with loud or whistling sounds) vomiting Side effects that usually do not require medical attention (report to your health care provider if they continue or are bothersome): headache joint pain muscle cramps, pain nausea This list may not describe all possible side effects. Call your doctor for medical advice about side effects. You may report side effects to FDA at 1-800-FDA-1088. Where should I keep my medication? This medicine is given in a hospital or clinic. It will not be stored at home. NOTE: This sheet is a summary. It may not cover all possible information. If you have questions about this medicine, talk to your doctor, pharmacist, or health care provider.  2022 Elsevier/Gold Standard (2020-01-02 15:47:26)

## 2020-10-05 NOTE — Telephone Encounter (Signed)
Roy Rile, MD  Roy [name], Roy Block, RN; Caprice Beaver, LPN 15 hours ago (075-GRM PM)   Agree with Dr. Penni Homans assessment. Repeat BMP this week.   [place] T. [name], MD, [place]  [address] [place]  [place], [place] [address]  [phone]  5:04 PM

## 2020-10-05 NOTE — Progress Notes (Signed)
Roy [name]    HEMATOLOGY/ONCOLOGY CLINIC NOTE  Date of Service: .10/05/2020   Patient Care Team: [name], DO as PCP - General (Family Medicine) O'Neal, [name], MD as PCP - Cardiology (Cardiology)  CHIEF COMPLAINTS/PURPOSE OF CONSULTATION:  Primary testicular large B cell lymphoma  HISTORY OF PRESENTING ILLNESS:   Roy [name] is a wonderful 75 y.o. male who has been referred to [place] by Dr Maudie Mercury for evaluation and management of primary testicular large B-cell lymphoma  Patient has a history of hypertension, borderline diabetes, previous nonischemic cardiomyopathy but with a recent MI,, CHF who presented with a left testicular swelling end of sept/ early oct 2021.  He notes that he was seen by Urology - Dr Minette Brine and had an ultrasound--possible infection was suspected and he received 2 rounds of abx without significant improvement.  He notes he received 2 subsequent follow-up ultrasounds in December in January and finally had left transinguinal orchiectomy on 03/11/2020. He notes he had persistent testicular swelling post operatively which required drainage.  He also notes having issues with urinary retention and had a urinary catheter for 2 weeks, abx+  Pathology showed primary testicular large B-cell lymphoma.  No other additional imaging studies for staging have been done at this time.  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  Patient was admitted to the hospital on 03/21/2020 with acute onset dyspnea epigastric pain and cough with pink frothy sputum.  He was noted to have hypoxia with oxygen saturation of 82% on arrival.  He was noted to have non-ST elevation MI on 2/26.  CTA chest ruled out PE.  His symptoms from fluid with aggressive diuresis.  He was noted to have severe triple-vessel disease on cath with an ejection fraction of 25 to 35% with no significant valvular lesions.  He subsequently had CORONARY ARTERY BYPASS  GRAFTING (CABG), ON PUMP, TIMES FOUR, USING BILATERAL INTERNAL MAMMARY ARTERIES AND LEFT RADIAL ARTERY .. On March 25, 2020.  Patient notes he is gradually recovering from his cardiac surgery notes being fatigued having gone through multiple urologic issues, skin surgery for squamous of carcinoma of the nose, acute MI CABG x4.  Patient notes no fevers no chills no night sweats. He notes he might have lost some weight but difficult to quantify in the setting of fluid overload and diuresis. Notes he is starting to eat a little better. Minimal left scrotal swelling. No other acute new focal symptoms.  INTERVAL HISTORY   Roy [name] is a wonderful 75 y.o. male who is here today for evaluation and management of Primary testicular large B cell lymphoma and prior to cycle 5 of R-CEOP chemotherapy The pt reports grade 1 fatigue. No fevers/n/v/d. No other acute new symptoms. Labs steady. He tolerated his 2nd dose of prophylactic intrathecal methotrexate without any acute nausea or vomiting or headaches.  He notes he had some nausea while driving home from the hospital in his car as a passenger .  Lab results 10/05/2020 reviewed with the patient.  CBC shows some anemia with a hemoglobin of 9.1, elevated WBC count of 17.9k on account of steroids and G-CSF. Platelets are normal at 263k.  CMP shows potassium of 3 patient was given oral potassium replacement in the infusion room.  More than elevation of ALT, chronic kidney disease creatinine of 1.37 and hypocalcemia. Recommended to eat more dairy.  He has been having increased and notes that his Lasix was recently increased and that  he has close follow-up with his cardiologist.  On review of systems, pt reports no other acute new symptoms.   MEDICAL HISTORY:  Past Medical History:  Diagnosis Date   Allergic rhinitis    Arthritis    wrists   Cardiomyopathy, nonischemic [facility])    followed by cardiology--- dr Delton See---  2016 ef 45-50% ,  2016  nuclear ef 37%,  2017 per echo ef 50-55%   CHF (congestive heart failure), NYHA class III (HCC) 03/21/2020   Coronary artery disease    GERD (gastroesophageal reflux disease)    Hiatal hernia    History of kidney stones    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;   01/ 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   History of syncope (03-06-2020 pt stated has not had sycopal episode in few years, stated it seems to happen in extreme hot conditions)   cardiologist--- dr Eloy End--- dx recurrent syncope;  nuclear study 11-03-2014 intermediate risk w/ no ischemia, apical hypokinesis, nuclear ef 37%;  event monitor-- 12-21-2015 SB/ ST  no pauses/ arrythmia's;  echo 08-03-2015 ef 50-55%   Hyperlipidemia    Hypertension    followed by pcp   Hypovitaminosis D    Mass of left testicle    Nocturia    Plantar fasciitis    Presence of surgical incision    01/ 2022  moh's w/ skin graft of nose, per pt still healing and wear bandage daily   Type 2 diabetes mellitus (HCC)    pt is adament that he is not and have been told he is a diabetic but a borderline;  followed by pcp, in pcp note states DM2 and takes 2 meds daily   Wears glasses    Wears hearing aid in both ears     SURGICAL HISTORY: Past Surgical History:  Procedure Laterality Date   COLONOSCOPY  last one 01-30-2017   CORONARY ARTERY BYPASS GRAFT N/A 03/25/2020   Procedure: CORONARY ARTERY BYPASS GRAFTING (CABG), ON PUMP, TIMES FOUR, USING BILATERAL INTERNAL MAMMARY ARTERIES AND LEFT RADIAL ARTERY;  Surgeon: Linden Dolin, MD;  Location: [facility] OR;  Service: Open Heart Surgery;  Laterality: N/A;   IR IMAGING GUIDED PORT INSERTION  07/06/2020   LOW ANTERIOR RESECTION RECTUM W/ COLOPROCTOSTOMY  05/2003   MOHS SURGERY  01/2020   nose w/ graft   ORCHIECTOMY Left 03/11/2020   Procedure: Clearance Coots;  Surgeon: Rene Paci, MD;  Location: [facility];  Service: Urology;  Laterality:  Left;  ONLY NEEDS 60 MIN   RADIAL ARTERY HARVEST Left 03/25/2020   Procedure: LEFT RADIAL ARTERY HARVEST;  Surgeon: Linden Dolin, MD;  Location: [facility] OR;  Service: Open Heart Surgery;  Laterality: Left;   RIGHT/LEFT HEART CATH AND CORONARY ANGIOGRAPHY N/A 03/23/2020   Procedure: RIGHT/LEFT HEART CATH AND CORONARY ANGIOGRAPHY;  Surgeon: Corky Crafts, MD;  Location: [facility] INVASIVE CV LAB;  Service: Cardiovascular;  Laterality: N/A;   SHOULDER SURGERY Right 1992; 07/ 2021   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 Left 12/24/2008   left ear and nose    TEE WITHOUT CARDIOVERSION N/A 03/25/2020   Procedure: TRANSESOPHAGEAL ECHOCARDIOGRAM (TEE);  Surgeon: Linden Dolin, MD;  Location: [facility] OR;  Service: Open Heart Surgery;  Laterality: N/A;   TONSILLECTOMY AND ADENOIDECTOMY  child   UPPER GASTROINTESTINAL ENDOSCOPY  last one 06-06-2017    SOCIAL HISTORY: Social History   Socioeconomic History  Marital status: Married    Spouse name: Not on file   Number of children: Not on file   Years of education: Not on file   Highest education level: Not on file  Occupational History   Not on file  Tobacco Use   Smoking status: Former    Types: Pipe    Quit date: 11/29/1976    Years since quitting: 43.8   Smokeless tobacco: Never  Vaping Use   Vaping Use: Never used  Substance and Sexual Activity   Alcohol use: Yes    Alcohol/week: 0.0 standard drinks    Comment: Occasional drink    Drug use: Never   Sexual activity: Not on file  Other Topics Concern   Not on file  Social History Narrative   Not on file   Social Determinants of Health   Financial Resource Strain: Not on file  Food Insecurity: Not on file  Transportation Needs: Not on file  Physical Activity: Not on file  Stress: Not on file  Social Connections: Not on file  Intimate Partner Violence: Not on file    FAMILY HISTORY: Family History  Problem Relation Age of Onset   Heart attack Mother    Stroke  Father    Colon cancer Neg Hx    Esophageal cancer Neg Hx    Pancreatic cancer Neg Hx    Rectal cancer Neg Hx    Stomach cancer Neg Hx    Colon polyps Neg Hx     ALLERGIES:  has No Known Allergies.  MEDICATIONS:  Current Outpatient Medications  Medication Sig Dispense Refill   acetaminophen (TYLENOL) 325 MG tablet Take 2 tablets (650 mg total) by mouth every 4 (four) hours as needed for headache or mild pain.     Apoaequorin (PREVAGEN PO) Take 1 tablet by mouth at bedtime.     aspirin EC 81 MG tablet Take 81 mg by mouth daily. Swallow whole.     atorvastatin (LIPITOR) 40 MG tablet Take 1 tablet (40 mg total) by mouth daily. 90 tablet 3   cholecalciferol (VITAMIN D) 1000 UNITS tablet Take 2,000 Units by mouth See admin instructions. Mon-friday     empagliflozin (JARDIANCE) 10 MG TABS tablet Take 1 tablet (10 mg total) by mouth daily before breakfast. 90 tablet 1   furosemide (LASIX) 80 MG tablet Take 0.5 tablets (40 mg total) by mouth daily. 30 tablet 11   glucosamine-chondroitin 500-400 MG tablet Take 1 tablet by mouth. 5 times weekly     LORazepam (ATIVAN) 0.5 MG tablet Take 1 tablet (0.5 mg total) by mouth every 6 (six) hours as needed for anxiety. Put 1 pill under your tongue every 6 hrs, if needed, for nausea 30 tablet 0   metFORMIN (GLUCOPHAGE) 1000 MG tablet Take 1,000 mg by mouth 2 (two) times daily with a meal.      metoprolol (TOPROL XL) 200 MG 24 hr tablet Take 1 tablet (200 mg total) by mouth daily. 90 tablet 3   Multiple Vitamin (MULTIVITAMIN WITH MINERALS) TABS tablet Take 1 tablet by mouth. 5 times weekly     omeprazole (PRILOSEC) 20 MG capsule Take 20 mg by mouth daily.     ondansetron (ZOFRAN) 8 MG tablet Take 1 tablet (8 mg total) by mouth every 8 (eight) hours as needed for nausea or vomiting. Take 1 pill every 8 hrs for 3 days. 30 tablet 3   prochlorperazine (COMPAZINE) 10 MG tablet Take 1 tablet (10 mg total) by mouth every 8 (eight)  hours as needed for nausea or  vomiting. 40 tablet 2   sacubitril-valsartan (ENTRESTO) 24-26 MG Take 1 tablet by mouth 2 (two) times daily. 60 tablet 3   spironolactone (ALDACTONE) 25 MG tablet Take 0.5 tablets (12.5 mg total) by mouth daily. 60 tablet 1   tamsulosin (FLOMAX) 0.4 MG CAPS capsule Take 0.4 mg by mouth at bedtime.     vitamin B-12 (CYANOCOBALAMIN) 1000 MCG tablet Take 1,000 mcg by mouth. 5 times a week     Current Facility-Administered Medications  Medication Dose Route Frequency Provider Last Rate Last Admin   potassium chloride (KLOR-CON) CR tablet 20 mEq  20 mEq Oral BID Atkins, Glenice Bow, MD       potassium chloride SA (KLOR-CON) CR tablet 40 mEq  40 mEq Oral Once Brunetta Genera, MD       Facility-Administered Medications Ordered in Other Visits  Medication Dose Route Frequency Provider Last Rate Last Admin   cyclophosphamide (CYTOXAN) 860 mg in sodium chloride 0.9 % 250 mL chemo infusion  400 mg/m2 (Treatment Plan Recorded) Intravenous Once Brunetta Genera, MD       dexamethasone (DECADRON) 10 mg in sodium chloride 0.9 % 50 mL IVPB  10 mg Intravenous Once Brunetta Genera, MD       etoposide (VEPESID) 80 mg in sodium chloride 0.9 % 250 mL chemo infusion  35 mg/m2 (Treatment Plan Recorded) Intravenous Once Brunetta Genera, MD       heparin lock flush 100 unit/mL  500 Units Intracatheter Once PRN Brunetta Genera, MD       predniSONE (DELTASONE) tablet 100 mg  100 mg Oral Once Brunetta Genera, MD       riTUXimab-pvvr (RUXIENCE) 800 mg in sodium chloride 0.9 % 170 mL infusion  375 mg/m2 (Treatment Plan Recorded) Intravenous Once Brunetta Genera, MD       sodium chloride flush (NS) 0.9 % injection 10 mL  10 mL Intracatheter PRN Brunetta Genera, MD       vinCRIStine (ONCOVIN) 1 mg in sodium chloride 0.9 % 50 mL chemo infusion  1 mg Intravenous Once Brunetta Genera, MD        REVIEW OF SYSTEMS:   .10 Point review of Systems was done is negative except as noted  above.     PHYSICAL EXAMINATION: ECOG PERFORMANCE STATUS: 2 - Symptomatic, <50% confined to bed  .  Vital signs reviewed  . GENERAL:alert, in no acute distress and comfortable SKIN: no acute rashes, no significant lesions EYES: conjunctiva are pink and non-injected, sclera anicteric OROPHARYNX: MMM, no exudates, no oropharyngeal erythema or ulceration NECK: supple, no JVD LYMPH:  no palpable lymphadenopathy in the cervical, axillary or inguinal regions LUNGS: clear to auscultation b/l with normal respiratory effort HEART: regular rate & rhythm ABDOMEN:  normoactive bowel sounds , non tender, not distended. Extremity: 2+ bilateral pedal edema pedal edema PSYCH: alert & oriented x 3 with fluent speech NEURO: no focal motor/sensory deficits    LABORATORY DATA:  I have reviewed the data as listed   CBC Latest Ref Rng & Units 10/05/2020 09/14/2020 08/24/2020  WBC 4.0 - 10.5 K/uL 17.9(H) 18.6(H) 23.7(H)  Hemoglobin 13.0 - 17.0 g/dL 9.1(L) 8.8(L) 9.8(L)  Hematocrit 39.0 - 52.0 % 28.7(L) 28.4(L) 31.3(L)  Platelets 150 - 400 K/uL 263 284 241    . CMP Latest Ref Rng & Units 10/05/2020 09/14/2020 08/24/2020  Glucose 70 - 99 mg/dL 200(H) 166(H) 238(H)  BUN 8 - 23 mg/dL  $'20 13 16  'H$ Creatinine 0.61 - 1.24 mg/dL 1.37(H) 1.27(H) 1.40(H)  Sodium 135 - 145 mmol/L 144 146(H) 144  Potassium 3.5 - 5.1 mmol/L 3.0(L) 3.3(L) 3.6  Chloride 98 - 111 mmol/L 106 109 107  CO2 22 - 32 mmol/L $RemoveB'22 22 24  'mQpbjAXF$ Calcium 8.9 - 10.3 mg/dL 7.3(L) 7.7(L) 7.3(L)  Total Protein 6.5 - 8.1 g/dL 6.0(L) 6.2(L) 6.6  Total Bilirubin 0.3 - 1.2 mg/dL 1.1 1.0 0.7  Alkaline Phos 38 - 126 U/L 131(H) 131(H) 84  AST 15 - 41 U/L $Remo'27 26 17  'ZnGtB$ ALT 0 - 44 U/L 50(H) 44 18   . Lab Results  Component Value Date   LDH 199 (H) 07/23/2020   SURGICAL PATHOLOGY   THIS IS AN ADDENDUM REPORT   CASE: [id number]  PATIENT: Scotty Theroux  Surgical Pathology Report  Addendum    Reason for Addendum #1:  Molecular Genetic Test Results, FISH    Clinical History: Left testicular mass (crm)      FINAL MICROSCOPIC DIAGNOSIS:   A. TESTICLE, LEFT, ORCHIECTOMY:  -  Diffuse aggressive large B-cell lymphoma  -  See comment    COMMENT:   Sections of testicle show architectural effacement by sheets of large  lymphoid cells with vesicular nuclei and pale cytoplasm.  There is  admixed apoptotic debris and increased mitotic activity.  By  immunohistochemistry, the large lymphoid cells are positive for CD20,  CD5 (dim), BCL6 (dim), MUM1, and BCL2.  They are negative for CD10, CD30  (<1%), cyclin D1, and TdT.  The Ki67 proliferation index is up to  approximately 70%.  CD3 highlights small T-cells in the background. EBV  is negative by in situ hybridization.   Together, the findings support the diagnosis of a diffuse aggressive  large B-cell lymphoma. The differential diagnosis includes a diffuse  large B-cell lymphoma, NOS, with activated B-cell subtype by the [facility]  algorithm and high-grade B-cell lymphoma with MYC and BCL2 or BCL6  rearrangements.  FISH for BCL2, BCL6, and MYC rearrangements will be  performed. Correlation with clinical and radiographic findings is  recommended for consideration of a primary testicular lymphoma.   Result reported to L. Gibson on 03/16/20 at 1720 by S. O'Neill.    ADDENDUM:   FISH RESULTS:   Results: NORMAL   Interpretation:   BCL6 rearrangement:      Not Detected  MYC rearrangement:  Not Detected  MYC amplification:  Not Detected  BCL6 rearrangement:      Not Detected   Please note this testing was performed and interpreted by an outside  facility ([facility]).  This addendum is only being added to provide a  summary of the results for report completeness.  Please see electronic  medical record for a copy of the full report.   RADIOGRAPHIC STUDIES: I have personally reviewed the radiological images as listed and agreed with the findings in the report. MR PELVIS W WO  CONTRAST  Result Date: 09/09/2020 CLINICAL DATA:  Continued scrotal pain status post left transinguinal orchiectomy on March 11, 2020 for primary testicular large B-cell lymphoma. EXAM: MRI PELVIS WITHOUT AND WITH CONTRAST TECHNIQUE: Multiplanar multisequence MR imaging of the pelvis was performed both before and after administration of intravenous contrast. CONTRAST:  13mL MULTIHANCE GADOBENATE DIMEGLUMINE 529 MG/ML IV SOLN COMPARISON:  Scrotal ultrasound July 23, 2020 and PET CT May 11, 2020. FINDINGS: Urinary Tract: Urinary bladder is unremarkable for degree of distension. Bowel:  Unremarkable visualized pelvic bowel loops. Vascular/Lymphatic: No pathologically enlarged lymph nodes.  No significant vascular abnormality seen. Reproductive: Postsurgical change of left transinguinal orchiectomy. Within the left hemiscrotum there is a 1.8 x 1.5 x 1.4 cm T2 hyperintense collection with a T2 dark rim, which demonstrates heterogeneously increased T1 intrinsic internal T1 signal and a hyperintense T1 nodular rim. There is no evidence of suspicious postcontrast enhancement associated with this lesion. Additionally, there is no suspicious enhancing soft tissue nodularity Other:  No pelvic ascites. Musculoskeletal: Heterogeneous marrow signal without discrete suspicious bone lesions identified. IMPRESSION: Postsurgical change of left transinguinal orchiectomy with a 1.8 cm collection in the left hemiscrotum, which may reflect a postoperative seroma/hematoma or lymphocele. No evidence of suspicious postcontrast enhancement in the surgical bed to suggest local recurrence. Electronically Signed   By: Dahlia Bailiff M.D.   On: 09/09/2020 09:56   DG Fluoro Guide Spinal/SI Jt Inj Left  Result Date: 09/30/2020 CLINICAL DATA:  Testicular lymphoma. EXAM: DIAGNOSTIC LUMBAR PUNCTURE UNDER FLUOROSCOPIC GUIDANCE COMPARISON:  Comparison made with September 03, 2020. FLUOROSCOPY TIME:  Fluoroscopy Time:  20 second Radiation Exposure  Index (if provided by the fluoroscopic device): 4 mGy Number of Acquired Spot Images: 0 PROCEDURE: Informed consent was obtained from the patient prior to the procedure, including potential complications of headache, allergy, and pain. With the patient prone, the lower back was prepped with Betadine. 1% Lidocaine was used for local anesthesia. Lumbar puncture was performed at the L4-L5 level using a 20 gauge needle with return of clear CSF with 3 mL of clear CSF was removed for flushing of tubing after medication administration. Intrathecal medication, methotrexate mixture as delivered from the pharmacy (12 mg methotrexate, 50 mg of Solu-Cortef) was delivered with slow injection. The tubing was flushed with 2 mL of sterile CSF obtained after access. The patient tolerated the procedure well. There were no apparent complications. IMPRESSION: Technically successful delivery of intrathecal chemotherapy as outlined above. Electronically Signed   By: Zetta Bills M.D.   On: 09/30/2020 11:38    ASSESSMENT & PLAN:   75 year old wonderful gentleman with history of hypertension, borderline diabetes, dyslipidemia, GERD with recent STEMI on 03/21/2020 and status post four-vessel CABG on 03/25/2020.  1) Left-sided primary testicular large B-cell lymphoma. Staging to be determined. BCL-2, BCL 6, c-Myc negative.  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  CBC shows some anemia with a hemoglobin of 9.1, elevated WBC count of 17.9k on account of steroids and G-CSF. Platelets are normal at 263k.  CMP shows potassium of 3 patient was  given oral potassium replacement in the infusion room.  More than elevation of ALT, chronic kidney disease creatinine of 1.37 and hypocalcemia. Recommended to eat more dairy.  He has been having increased and notes that his Lasix was recently increased and that he has close follow-up with his cardiologist.  He was recommended to follow-up with his cardiologist and primary care physician for continued management and monitoring of potassium replacement in the context of active diuresis.  -No significant related toxicities from second dose of intrathecal methotrexate for CNS prophylaxis.  -We will need to monitor hemoglobin for transfusion needs. -will keep chemotherapy doses for C5 the same as C4 -continue G-CSF support -  discussed and patient is agreeable to 3rd dose of IT MTX for CNS prophylaxis which is to be scheduled for cycle 4-day 15 -Continue other supportive medications the same. -We should hold off on the prednisone to cycle due to fluid retention from his cardiac issues and chronic kidney disease.   FOLLOW UP: IR for third dose of intrathecal methotrexate on 10/22/2020. Please schedule for port flush labs and possible 1 unit of PRBC in 12 days. Please schedule cycle 6 of R-CEOP as per orders. Port flush labs and MD visit cycle 6-day 1.   All of the patients questions were answered with apparent satisfaction. The patient knows to call the clinic with any problems, questions or concerns.  . The total time spent in the appointment was 30 minutes and more than 50% was on counseling and direct patient cares.   Sullivan Lone MD [place] AAHIVMS [facility] Hematology/Oncology Physician [facility]

## 2020-10-06 ENCOUNTER — Telehealth: Payer: Self-pay | Admitting: Hematology

## 2020-10-06 ENCOUNTER — Inpatient Hospital Stay: Payer: Medicare HMO

## 2020-10-06 VITALS — BP 102/69 | HR 61 | Temp 97.8°F | Resp 18

## 2020-10-06 DIAGNOSIS — Z5112 Encounter for antineoplastic immunotherapy: Secondary | ICD-10-CM | POA: Diagnosis not present

## 2020-10-06 DIAGNOSIS — C8330 Diffuse large B-cell lymphoma, unspecified site: Secondary | ICD-10-CM

## 2020-10-06 DIAGNOSIS — Z7189 Other specified counseling: Secondary | ICD-10-CM

## 2020-10-06 DIAGNOSIS — Z5189 Encounter for other specified aftercare: Secondary | ICD-10-CM | POA: Diagnosis not present

## 2020-10-06 DIAGNOSIS — Z5111 Encounter for antineoplastic chemotherapy: Secondary | ICD-10-CM | POA: Diagnosis not present

## 2020-10-06 DIAGNOSIS — C8335 Diffuse large B-cell lymphoma, lymph nodes of inguinal region and lower limb: Secondary | ICD-10-CM | POA: Diagnosis not present

## 2020-10-06 DIAGNOSIS — Z79899 Other long term (current) drug therapy: Secondary | ICD-10-CM | POA: Diagnosis not present

## 2020-10-06 MED ORDER — HEPARIN SOD (PORK) LOCK FLUSH 100 UNIT/ML IV SOLN
500.0000 [IU] | Freq: Once | INTRAVENOUS | Status: AC | PRN
Start: 2020-10-06 — End: 2020-10-06
  Administered 2020-10-06: 500 [IU]

## 2020-10-06 MED ORDER — SODIUM CHLORIDE 0.9 % IV SOLN
35.0000 mg/m2 | Freq: Once | INTRAVENOUS | Status: AC
  Administered 2020-10-06: 80 mg via INTRAVENOUS
  Filled 2020-10-06: qty 4

## 2020-10-06 MED ORDER — PROCHLORPERAZINE MALEATE 10 MG PO TABS
ORAL_TABLET | ORAL | Status: AC
  Filled 2020-10-06: qty 1

## 2020-10-06 MED ORDER — SODIUM CHLORIDE 0.9 % IV SOLN: Freq: Once | INTRAVENOUS | Status: AC

## 2020-10-06 MED ORDER — PROCHLORPERAZINE MALEATE 10 MG PO TABS
10.0000 mg | ORAL_TABLET | Freq: Once | ORAL | Status: AC
  Administered 2020-10-06: 10 mg via ORAL

## 2020-10-06 MED ORDER — SODIUM CHLORIDE 0.9% FLUSH
10.0000 mL | INTRAVENOUS | Status: DC | PRN
  Administered 2020-10-06: 10 mL

## 2020-10-06 NOTE — Patient Instructions (Signed)
[facility]  Discharge Instructions: Thank you for choosing [facility] to provide your oncology and hematology care.   If you have a lab appointment with the [facility], please go directly to the [facility] and check in at the registration area.   Wear comfortable clothing and clothing appropriate for easy access to any Portacath or PICC line.   We strive to give you quality time with your provider. You may need to reschedule your appointment if you arrive late (15 or more minutes).  Arriving late affects you and other patients whose appointments are after yours.  Also, if you miss three or more appointments without notifying the office, you may be dismissed from the clinic at the provider's discretion.      For prescription refill requests, have your pharmacy contact our office and allow 72 hours for refills to be completed.    Today you received the following chemotherapy and/or immunotherapy agents Etoposide (Vespiseed)      To help prevent nausea and vomiting after your treatment, we encourage you to take your nausea medication as directed.  BELOW ARE SYMPTOMS THAT SHOULD BE REPORTED IMMEDIATELY: *FEVER GREATER THAN 100.4 F (38 C) OR HIGHER *CHILLS OR SWEATING *NAUSEA AND VOMITING THAT IS NOT CONTROLLED WITH YOUR NAUSEA MEDICATION *UNUSUAL SHORTNESS OF BREATH *UNUSUAL BRUISING OR BLEEDING *URINARY PROBLEMS (pain or burning when urinating, or frequent urination) *BOWEL PROBLEMS (unusual diarrhea, constipation, pain near the anus) TENDERNESS IN MOUTH AND THROAT WITH OR WITHOUT PRESENCE OF ULCERS (sore throat, sores in mouth, or a toothache) UNUSUAL RASH, SWELLING OR PAIN  UNUSUAL VAGINAL DISCHARGE OR ITCHING   Items with * indicate a potential emergency and should be followed up as soon as possible or go to the Emergency Department if any problems should occur.  Please show the CHEMOTHERAPY ALERT CARD or IMMUNOTHERAPY ALERT CARD at  check-in to the Emergency Department and triage nurse.  Should you have questions after your visit or need to cancel or reschedule your appointment, please contact [facility]  Dept: [phone]  and follow the prompts.  Office hours are 8:00 a.m. to 4:30 p.m. Monday - Friday. Please note that voicemails left after 4:00 p.m. may not be returned until the following business day.  We are closed weekends and major holidays. You have access to a nurse at all times for urgent questions. Please call the main number to the clinic Dept: [phone] and follow the prompts.   For any non-urgent questions, you may also contact your provider using MyChart. We now offer e-Visits for anyone 77 and older to request care online for non-urgent symptoms. For details visit [email or web address].   Also download the MyChart app! Go to the app store, search "MyChart", open the app, select [facility], and log in with your MyChart username and password.  Due to Covid, a mask is required upon entering the hospital/clinic. If you do not have a mask, one will be given to you upon arrival. For doctor visits, patients may have 1 support person aged 63 or older with them. For treatment visits, patients cannot have anyone with them due to current Covid guidelines and our immunocompromised population.

## 2020-10-06 NOTE — Telephone Encounter (Signed)
Left message with follow-up appointment per 9/12 los.

## 2020-10-06 NOTE — Telephone Encounter (Signed)
Left message with follow-up appointments per 9/12 los.

## 2020-10-07 ENCOUNTER — Encounter: Payer: Self-pay | Admitting: Hematology

## 2020-10-07 ENCOUNTER — Inpatient Hospital Stay: Payer: Medicare HMO

## 2020-10-07 ENCOUNTER — Other Ambulatory Visit: Payer: Self-pay

## 2020-10-07 VITALS — BP 103/74 | HR 97 | Temp 97.9°F | Resp 18

## 2020-10-07 DIAGNOSIS — Z79899 Other long term (current) drug therapy: Secondary | ICD-10-CM | POA: Diagnosis not present

## 2020-10-07 DIAGNOSIS — Z5112 Encounter for antineoplastic immunotherapy: Secondary | ICD-10-CM | POA: Diagnosis not present

## 2020-10-07 DIAGNOSIS — Z5189 Encounter for other specified aftercare: Secondary | ICD-10-CM | POA: Diagnosis not present

## 2020-10-07 DIAGNOSIS — Z7189 Other specified counseling: Secondary | ICD-10-CM

## 2020-10-07 DIAGNOSIS — C8330 Diffuse large B-cell lymphoma, unspecified site: Secondary | ICD-10-CM

## 2020-10-07 DIAGNOSIS — Z5111 Encounter for antineoplastic chemotherapy: Secondary | ICD-10-CM | POA: Diagnosis not present

## 2020-10-07 DIAGNOSIS — C8335 Diffuse large B-cell lymphoma, lymph nodes of inguinal region and lower limb: Secondary | ICD-10-CM | POA: Diagnosis not present

## 2020-10-07 LAB — BASIC METABOLIC PANEL
BUN/Creatinine Ratio: 18 (ref 10–24)
BUN: 23 mg/dL (ref 8–27)
CO2: 21 mmol/L (ref 20–29)
Calcium: 7.3 mg/dL — ABNORMAL LOW (ref 8.6–10.2)
Chloride: 105 mmol/L (ref 96–106)
Creatinine, Ser: 1.3 mg/dL — ABNORMAL HIGH (ref 0.76–1.27)
Glucose: 126 mg/dL — ABNORMAL HIGH (ref 65–99)
Potassium: 3.8 mmol/L (ref 3.5–5.2)
Sodium: 147 mmol/L — ABNORMAL HIGH (ref 134–144)
eGFR: 57 mL/min/{1.73_m2} — ABNORMAL LOW (ref >59)

## 2020-10-07 MED ORDER — PROCHLORPERAZINE MALEATE 10 MG PO TABS
10.0000 mg | ORAL_TABLET | Freq: Once | ORAL | Status: AC
  Administered 2020-10-07: 10 mg via ORAL
  Filled 2020-10-07: qty 1

## 2020-10-07 MED ORDER — SODIUM CHLORIDE 0.9% FLUSH
10.0000 mL | INTRAVENOUS | Status: DC | PRN
  Administered 2020-10-07: 10 mL

## 2020-10-07 MED ORDER — HEPARIN SOD (PORK) LOCK FLUSH 100 UNIT/ML IV SOLN
500.0000 [IU] | Freq: Once | INTRAVENOUS | Status: AC | PRN
  Administered 2020-10-07: 500 [IU]

## 2020-10-07 MED ORDER — SODIUM CHLORIDE 0.9 % IV SOLN: Freq: Once | INTRAVENOUS | Status: AC

## 2020-10-07 MED ORDER — SODIUM CHLORIDE 0.9 % IV SOLN
35.0000 mg/m2 | Freq: Once | INTRAVENOUS | Status: AC
  Administered 2020-10-07: 80 mg via INTRAVENOUS
  Filled 2020-10-07: qty 4

## 2020-10-07 NOTE — Patient Instructions (Signed)
[facility]  Discharge Instructions: Thank you for choosing [facility] to provide your oncology and hematology care.   If you have a lab appointment with the [facility], please go directly to the [facility] and check in at the registration area.   Wear comfortable clothing and clothing appropriate for easy access to any Portacath or PICC line.   We strive to give you quality time with your provider. You may need to reschedule your appointment if you arrive late (15 or more minutes).  Arriving late affects you and other patients whose appointments are after yours.  Also, if you miss three or more appointments without notifying the office, you may be dismissed from the clinic at the provider's discretion.      For prescription refill requests, have your pharmacy contact our office and allow 72 hours for refills to be completed.    Today you received the following chemotherapy and/or immunotherapy agents Etoposide (Vespiseed)      To help prevent nausea and vomiting after your treatment, we encourage you to take your nausea medication as directed.  BELOW ARE SYMPTOMS THAT SHOULD BE REPORTED IMMEDIATELY: *FEVER GREATER THAN 100.4 F (38 C) OR HIGHER *CHILLS OR SWEATING *NAUSEA AND VOMITING THAT IS NOT CONTROLLED WITH YOUR NAUSEA MEDICATION *UNUSUAL SHORTNESS OF BREATH *UNUSUAL BRUISING OR BLEEDING *URINARY PROBLEMS (pain or burning when urinating, or frequent urination) *BOWEL PROBLEMS (unusual diarrhea, constipation, pain near the anus) TENDERNESS IN MOUTH AND THROAT WITH OR WITHOUT PRESENCE OF ULCERS (sore throat, sores in mouth, or a toothache) UNUSUAL RASH, SWELLING OR PAIN  UNUSUAL VAGINAL DISCHARGE OR ITCHING   Items with * indicate a potential emergency and should be followed up as soon as possible or go to the Emergency Department if any problems should occur.  Please show the CHEMOTHERAPY ALERT CARD or IMMUNOTHERAPY ALERT CARD at  check-in to the Emergency Department and triage nurse.  Should you have questions after your visit or need to cancel or reschedule your appointment, please contact [facility]  Dept: [phone]  and follow the prompts.  Office hours are 8:00 a.m. to 4:30 p.m. Monday - Friday. Please note that voicemails left after 4:00 p.m. may not be returned until the following business day.  We are closed weekends and major holidays. You have access to a nurse at all times for urgent questions. Please call the main number to the clinic Dept: [phone] and follow the prompts.   For any non-urgent questions, you may also contact your provider using MyChart. We now offer e-Visits for anyone 76 and older to request care online for non-urgent symptoms. For details visit [email or web address].   Also download the MyChart app! Go to the app store, search "MyChart", open the app, select [facility], and log in with your MyChart username and password.  Due to Covid, a mask is required upon entering the hospital/clinic. If you do not have a mask, one will be given to you upon arrival. For doctor visits, patients may have 1 support person aged 38 or older with them. For treatment visits, patients cannot have anyone with them due to current Covid guidelines and our immunocompromised population.

## 2020-10-09 ENCOUNTER — Inpatient Hospital Stay: Payer: Medicare HMO

## 2020-10-09 ENCOUNTER — Other Ambulatory Visit: Payer: Self-pay

## 2020-10-09 VITALS — BP 103/68 | HR 97 | Temp 98.5°F | Resp 16

## 2020-10-09 DIAGNOSIS — C8335 Diffuse large B-cell lymphoma, lymph nodes of inguinal region and lower limb: Secondary | ICD-10-CM | POA: Diagnosis not present

## 2020-10-09 DIAGNOSIS — Z5189 Encounter for other specified aftercare: Secondary | ICD-10-CM | POA: Diagnosis not present

## 2020-10-09 DIAGNOSIS — Z5111 Encounter for antineoplastic chemotherapy: Secondary | ICD-10-CM | POA: Diagnosis not present

## 2020-10-09 DIAGNOSIS — Z79899 Other long term (current) drug therapy: Secondary | ICD-10-CM | POA: Diagnosis not present

## 2020-10-09 DIAGNOSIS — C8330 Diffuse large B-cell lymphoma, unspecified site: Secondary | ICD-10-CM

## 2020-10-09 DIAGNOSIS — Z7189 Other specified counseling: Secondary | ICD-10-CM

## 2020-10-09 DIAGNOSIS — Z5112 Encounter for antineoplastic immunotherapy: Secondary | ICD-10-CM | POA: Diagnosis not present

## 2020-10-09 MED ORDER — PEGFILGRASTIM-CBQV 6 MG/0.6ML ~~LOC~~ SOSY
6.0000 mg | PREFILLED_SYRINGE | Freq: Once | SUBCUTANEOUS | Status: AC
  Administered 2020-10-09: 6 mg via SUBCUTANEOUS
  Filled 2020-10-09: qty 0.6

## 2020-10-09 NOTE — Patient Instructions (Signed)

## 2020-10-11 ENCOUNTER — Encounter: Payer: Self-pay | Admitting: Hematology

## 2020-10-12 DIAGNOSIS — C8339 Diffuse large B-cell lymphoma, extranodal and solid organ sites: Secondary | ICD-10-CM | POA: Diagnosis not present

## 2020-10-12 DIAGNOSIS — E1159 Type 2 diabetes mellitus with other circulatory complications: Secondary | ICD-10-CM | POA: Diagnosis not present

## 2020-10-12 DIAGNOSIS — I429 Cardiomyopathy, unspecified: Secondary | ICD-10-CM | POA: Diagnosis not present

## 2020-10-12 DIAGNOSIS — D509 Iron deficiency anemia, unspecified: Secondary | ICD-10-CM | POA: Diagnosis not present

## 2020-10-12 DIAGNOSIS — E7800 Pure hypercholesterolemia, unspecified: Secondary | ICD-10-CM | POA: Diagnosis not present

## 2020-10-12 DIAGNOSIS — Z951 Presence of aortocoronary bypass graft: Secondary | ICD-10-CM | POA: Diagnosis not present

## 2020-10-12 DIAGNOSIS — I10 Essential (primary) hypertension: Secondary | ICD-10-CM | POA: Diagnosis not present

## 2020-10-13 ENCOUNTER — Other Ambulatory Visit: Payer: Self-pay | Admitting: Cardiovascular Disease

## 2020-10-16 ENCOUNTER — Inpatient Hospital Stay: Payer: Medicare HMO

## 2020-10-16 ENCOUNTER — Other Ambulatory Visit: Payer: Self-pay

## 2020-10-16 DIAGNOSIS — C8335 Diffuse large B-cell lymphoma, lymph nodes of inguinal region and lower limb: Secondary | ICD-10-CM | POA: Diagnosis not present

## 2020-10-16 DIAGNOSIS — Z79899 Other long term (current) drug therapy: Secondary | ICD-10-CM | POA: Diagnosis not present

## 2020-10-16 DIAGNOSIS — Z5189 Encounter for other specified aftercare: Secondary | ICD-10-CM | POA: Diagnosis not present

## 2020-10-16 DIAGNOSIS — Z5112 Encounter for antineoplastic immunotherapy: Secondary | ICD-10-CM | POA: Diagnosis not present

## 2020-10-16 DIAGNOSIS — C8330 Diffuse large B-cell lymphoma, unspecified site: Secondary | ICD-10-CM

## 2020-10-16 DIAGNOSIS — Z95828 Presence of other vascular implants and grafts: Secondary | ICD-10-CM

## 2020-10-16 DIAGNOSIS — Z5111 Encounter for antineoplastic chemotherapy: Secondary | ICD-10-CM | POA: Diagnosis not present

## 2020-10-16 LAB — CBC WITH DIFFERENTIAL (CANCER CENTER ONLY)
Abs Immature Granulocytes: 0.27 10*3/uL — ABNORMAL HIGH (ref 0.00–0.07)
Basophils Absolute: 0.2 10*3/uL — ABNORMAL HIGH (ref 0.0–0.1)
Basophils Relative: 1 %
Eosinophils Absolute: 0.1 10*3/uL (ref 0.0–0.5)
Eosinophils Relative: 1 %
HCT: 26.6 % — ABNORMAL LOW (ref 39.0–52.0)
Hemoglobin: 8.4 g/dL — ABNORMAL LOW (ref 13.0–17.0)
Immature Granulocytes: 2 %
Lymphocytes Relative: 6 %
Lymphs Abs: 0.9 10*3/uL (ref 0.7–4.0)
MCH: 28 pg (ref 26.0–34.0)
MCHC: 31.6 g/dL (ref 30.0–36.0)
MCV: 88.7 fL (ref 80.0–100.0)
Monocytes Absolute: 1.5 10*3/uL — ABNORMAL HIGH (ref 0.1–1.0)
Monocytes Relative: 9 %
Neutro Abs: 12.6 10*3/uL — ABNORMAL HIGH (ref 1.7–7.7)
Neutrophils Relative %: 81 %
Platelet Count: 195 10*3/uL (ref 150–400)
RBC: 3 MIL/uL — ABNORMAL LOW (ref 4.22–5.81)
RDW: 19.4 % — ABNORMAL HIGH (ref 11.5–15.5)
WBC Count: 15.5 10*3/uL — ABNORMAL HIGH (ref 4.0–10.5)
nRBC: 0.6 % — ABNORMAL HIGH (ref 0.0–0.2)

## 2020-10-16 LAB — CMP (CANCER CENTER ONLY)
ALT: 24 U/L (ref 0–44)
AST: 17 U/L (ref 15–41)
Albumin: 3.4 g/dL — ABNORMAL LOW (ref 3.5–5.0)
Alkaline Phosphatase: 113 U/L (ref 38–126)
Anion gap: 16 — ABNORMAL HIGH (ref 5–15)
BUN: 12 mg/dL (ref 8–23)
CO2: 22 mmol/L (ref 22–32)
Calcium: 7.9 mg/dL — ABNORMAL LOW (ref 8.9–10.3)
Chloride: 105 mmol/L (ref 98–111)
Creatinine: 1.22 mg/dL (ref 0.61–1.24)
GFR, Estimated: >60 mL/min (ref >60)
Glucose, Bld: 198 mg/dL — ABNORMAL HIGH (ref 70–99)
Potassium: 3 mmol/L — ABNORMAL LOW (ref 3.5–5.1)
Sodium: 143 mmol/L (ref 135–145)
Total Bilirubin: 1 mg/dL (ref 0.3–1.2)
Total Protein: 6 g/dL — ABNORMAL LOW (ref 6.5–8.1)

## 2020-10-16 LAB — SAMPLE TO BLOOD BANK

## 2020-10-16 MED ORDER — HEPARIN SOD (PORK) LOCK FLUSH 100 UNIT/ML IV SOLN
500.0000 [IU] | Freq: Once | INTRAVENOUS | Status: AC
  Administered 2020-10-16: 500 [IU]

## 2020-10-16 MED ORDER — SODIUM CHLORIDE 0.9% FLUSH
10.0000 mL | Freq: Once | INTRAVENOUS | Status: AC
  Administered 2020-10-16: 10 mL

## 2020-10-16 NOTE — Patient Instructions (Signed)
Implanted Port Home Guide An implanted port is a device that is placed under the skin. It is usually placed in the chest. The device can be used to give IV medicine, to take blood, or for dialysis. You may have an implanted port if: You need IV medicine that would be irritating to the small veins in your hands or arms. You need IV medicines, such as antibiotics, for a long period of time. You need IV nutrition for a long period of time. You need dialysis. When you have a port, your health care provider can choose to use the port instead of veins in your arms for these procedures. You may have fewer limitations when using a port than you would if you used other types of long-term IVs, and you will likely be able to return to normal activities after your incision heals. An implanted port has two main parts: Reservoir. The reservoir is the part where a needle is inserted to give medicines or draw blood. The reservoir is round. After it is placed, it appears as a small, raised area under your skin. Catheter. The catheter is a thin, flexible tube that connects the reservoir to a vein. Medicine that is inserted into the reservoir goes into the catheter and then into the vein. How is my port accessed? To access your port: A numbing cream may be placed on the skin over the port site. Your health care provider will put on a mask and sterile gloves. The skin over your port will be cleaned carefully with a germ-killing soap and allowed to dry. Your health care provider will gently pinch the port and insert a needle into it. Your health care provider will check for a blood return to make sure the port is in the vein and is not clogged. If your port needs to remain accessed to get medicine continuously (constant infusion), your health care provider will place a clear bandage (dressing) over the needle site. The dressing and needle will need to be changed every week, or as told by your health care provider. What  is flushing? Flushing helps keep the port from getting clogged. Follow instructions from your health care provider about how and when to flush the port. Ports are usually flushed with saline solution or a medicine called heparin. The need for flushing will depend on how the port is used: If the port is only used from time to time to give medicines or draw blood, the port may need to be flushed: Before and after medicines have been given. Before and after blood has been drawn. As part of routine maintenance. Flushing may be recommended every 4-6 weeks. If a constant infusion is running, the port may not need to be flushed. Throw away any syringes in a disposal container that is meant for sharp items (sharps container). You can buy a sharps container from a pharmacy, or you can make one by using an empty hard plastic bottle with a cover. How long will my port stay implanted? The port can stay in for as long as your health care provider thinks it is needed. When it is time for the port to come out, a surgery will be done to remove it. The surgery will be similar to the procedure that was done to put the port in. Follow these instructions at home:  Flush your port as told by your health care provider. If you need an infusion over several days, follow instructions from your health care provider about how   to take care of your port site. Make sure you: Wash your hands with soap and water before you change your dressing. If soap and water are not available, use alcohol-based hand sanitizer. Change your dressing as told by your health care provider. Place any used dressings or infusion bags into a plastic bag. Throw that bag in the trash. Keep the dressing that covers the needle clean and dry. Do not get it wet. Do not use scissors or sharp objects near the tube. Keep the tube clamped, unless it is being used. Check your port site every day for signs of infection. Check for: Redness, swelling, or  pain. Fluid or blood. Pus or a bad smell. Protect the skin around the port site. Avoid wearing bra straps that rub or irritate the site. Protect the skin around your port from seat belts. Place a soft pad over your chest if needed. Bathe or shower as told by your health care provider. The site may get wet as long as you are not actively receiving an infusion. Return to your normal activities as told by your health care provider. Ask your health care provider what activities are safe for you. Carry a medical alert card or wear a medical alert bracelet at all times. This will let health care providers know that you have an implanted port in case of an emergency. Get help right away if: You have redness, swelling, or pain at the port site. You have fluid or blood coming from your port site. You have pus or a bad smell coming from the port site. You have a fever. Summary Implanted ports are usually placed in the chest for long-term IV access. Follow instructions from your health care provider about flushing the port and changing bandages (dressings). Take care of the area around your port by avoiding clothing that puts pressure on the area, and by watching for signs of infection. Protect the skin around your port from seat belts. Place a soft pad over your chest if needed. Get help right away if you have a fever or you have redness, swelling, pain, drainage, or a bad smell at the port site. This information is not intended to replace advice given to you by your health care provider. Make sure you discuss any questions you have with your health care provider. Document Revised: 04/01/2020 Document Reviewed: 05/27/2019 Elsevier Patient Education  2022 [employer] Inc.  

## 2020-10-16 NOTE — Progress Notes (Signed)
Resented today for 1 unit of PRBCs, CBC resulted and hgb was 8.4. Dr.Kale notified and per MD pt does not need a blood transfusion today. Pt and family made aware and agreeable. Pt de-accessed in infusion room, given a copy of labs, and pt wheeled to lobby in stale condition without complaints.

## 2020-10-26 ENCOUNTER — Inpatient Hospital Stay: Payer: Medicare HMO

## 2020-10-26 ENCOUNTER — Inpatient Hospital Stay (HOSPITAL_BASED_OUTPATIENT_CLINIC_OR_DEPARTMENT_OTHER): Payer: Medicare HMO | Admitting: Hematology

## 2020-10-26 ENCOUNTER — Other Ambulatory Visit: Payer: Self-pay

## 2020-10-26 ENCOUNTER — Inpatient Hospital Stay: Payer: Medicare HMO | Attending: Hematology

## 2020-10-26 ENCOUNTER — Other Ambulatory Visit: Payer: Self-pay | Admitting: Hematology

## 2020-10-26 VITALS — BP 96/65 | HR 90 | Temp 98.4°F | Resp 18 | Wt 197.8 lb

## 2020-10-26 DIAGNOSIS — Z5189 Encounter for other specified aftercare: Secondary | ICD-10-CM | POA: Diagnosis not present

## 2020-10-26 DIAGNOSIS — Z79899 Other long term (current) drug therapy: Secondary | ICD-10-CM | POA: Diagnosis not present

## 2020-10-26 DIAGNOSIS — C8330 Diffuse large B-cell lymphoma, unspecified site: Secondary | ICD-10-CM

## 2020-10-26 DIAGNOSIS — Z5111 Encounter for antineoplastic chemotherapy: Secondary | ICD-10-CM | POA: Diagnosis not present

## 2020-10-26 DIAGNOSIS — C8335 Diffuse large B-cell lymphoma, lymph nodes of inguinal region and lower limb: Secondary | ICD-10-CM | POA: Diagnosis not present

## 2020-10-26 DIAGNOSIS — Z95828 Presence of other vascular implants and grafts: Secondary | ICD-10-CM

## 2020-10-26 DIAGNOSIS — Z5112 Encounter for antineoplastic immunotherapy: Secondary | ICD-10-CM | POA: Diagnosis not present

## 2020-10-26 DIAGNOSIS — M7989 Other specified soft tissue disorders: Secondary | ICD-10-CM

## 2020-10-26 DIAGNOSIS — Z7189 Other specified counseling: Secondary | ICD-10-CM

## 2020-10-26 LAB — CMP (CANCER CENTER ONLY)
ALT: 18 U/L (ref 0–44)
AST: 18 U/L (ref 15–41)
Albumin: 3.3 g/dL — ABNORMAL LOW (ref 3.5–5.0)
Alkaline Phosphatase: 106 U/L (ref 38–126)
Anion gap: 16 — ABNORMAL HIGH (ref 5–15)
BUN: 19 mg/dL (ref 8–23)
CO2: 24 mmol/L (ref 22–32)
Calcium: 7 mg/dL — ABNORMAL LOW (ref 8.9–10.3)
Chloride: 104 mmol/L (ref 98–111)
Creatinine: 1.26 mg/dL — ABNORMAL HIGH (ref 0.61–1.24)
GFR, Estimated: 59 mL/min — ABNORMAL LOW (ref >60)
Glucose, Bld: 225 mg/dL — ABNORMAL HIGH (ref 70–99)
Potassium: 3.2 mmol/L — ABNORMAL LOW (ref 3.5–5.1)
Sodium: 144 mmol/L (ref 135–145)
Total Bilirubin: 0.9 mg/dL (ref 0.3–1.2)
Total Protein: 5.8 g/dL — ABNORMAL LOW (ref 6.5–8.1)

## 2020-10-26 LAB — CBC WITH DIFFERENTIAL (CANCER CENTER ONLY)
Abs Immature Granulocytes: 0.68 10*3/uL — ABNORMAL HIGH (ref 0.00–0.07)
Basophils Absolute: 0.1 10*3/uL (ref 0.0–0.1)
Basophils Relative: 0 %
Eosinophils Absolute: 0 10*3/uL (ref 0.0–0.5)
Eosinophils Relative: 0 %
HCT: 28.8 % — ABNORMAL LOW (ref 39.0–52.0)
Hemoglobin: 9 g/dL — ABNORMAL LOW (ref 13.0–17.0)
Immature Granulocytes: 2 %
Lymphocytes Relative: 1 %
Lymphs Abs: 0.4 10*3/uL — ABNORMAL LOW (ref 0.7–4.0)
MCH: 28 pg (ref 26.0–34.0)
MCHC: 31.3 g/dL (ref 30.0–36.0)
MCV: 89.7 fL (ref 80.0–100.0)
Monocytes Absolute: 0.6 10*3/uL (ref 0.1–1.0)
Monocytes Relative: 2 %
Neutro Abs: 28.1 10*3/uL — ABNORMAL HIGH (ref 1.7–7.7)
Neutrophils Relative %: 95 %
Platelet Count: 204 10*3/uL (ref 150–400)
RBC: 3.21 MIL/uL — ABNORMAL LOW (ref 4.22–5.81)
RDW: 20.6 % — ABNORMAL HIGH (ref 11.5–15.5)
WBC Count: 30 10*3/uL — ABNORMAL HIGH (ref 4.0–10.5)
nRBC: 0.1 % (ref 0.0–0.2)

## 2020-10-26 MED ORDER — RITUXIMAB-PVVR CHEMO 500 MG/50ML IV SOLN
375.0000 mg/m2 | Freq: Once | INTRAVENOUS | Status: AC
  Administered 2020-10-26: 800 mg via INTRAVENOUS
  Filled 2020-10-26: qty 50

## 2020-10-26 MED ORDER — SODIUM CHLORIDE 0.9 % IV SOLN
400.0000 mg/m2 | Freq: Once | INTRAVENOUS | Status: AC
  Administered 2020-10-26: 860 mg via INTRAVENOUS
  Filled 2020-10-26: qty 43

## 2020-10-26 MED ORDER — PALONOSETRON HCL INJECTION 0.25 MG/5ML
0.2500 mg | Freq: Once | INTRAVENOUS | Status: AC
  Administered 2020-10-26: 0.25 mg via INTRAVENOUS
  Filled 2020-10-26: qty 5

## 2020-10-26 MED ORDER — HEPARIN SOD (PORK) LOCK FLUSH 100 UNIT/ML IV SOLN
500.0000 [IU] | Freq: Once | INTRAVENOUS | Status: AC | PRN
  Administered 2020-10-26: 500 [IU]

## 2020-10-26 MED ORDER — SODIUM CHLORIDE 0.9 % IV SOLN
35.0000 mg/m2 | Freq: Once | INTRAVENOUS | Status: AC
  Administered 2020-10-26: 80 mg via INTRAVENOUS
  Filled 2020-10-26: qty 4

## 2020-10-26 MED ORDER — SODIUM CHLORIDE 0.9 % IV SOLN: Freq: Once | INTRAVENOUS | Status: AC

## 2020-10-26 MED ORDER — SODIUM CHLORIDE 0.9% FLUSH
10.0000 mL | INTRAVENOUS | Status: DC | PRN
  Administered 2020-10-26: 10 mL

## 2020-10-26 MED ORDER — DIPHENHYDRAMINE HCL 25 MG PO CAPS
50.0000 mg | ORAL_CAPSULE | Freq: Once | ORAL | Status: AC
  Administered 2020-10-26: 50 mg via ORAL
  Filled 2020-10-26: qty 2

## 2020-10-26 MED ORDER — SODIUM CHLORIDE 0.9% FLUSH
10.0000 mL | Freq: Once | INTRAVENOUS | Status: AC
  Administered 2020-10-26: 10 mL

## 2020-10-26 MED ORDER — VINCRISTINE SULFATE CHEMO INJECTION 1 MG/ML
1.0000 mg | Freq: Once | INTRAVENOUS | Status: AC
  Administered 2020-10-26: 1 mg via INTRAVENOUS
  Filled 2020-10-26: qty 1

## 2020-10-26 MED ORDER — SODIUM CHLORIDE 0.9 % IV SOLN
10.0000 mg | Freq: Once | INTRAVENOUS | Status: AC
  Administered 2020-10-26: 10 mg via INTRAVENOUS
  Filled 2020-10-26: qty 10

## 2020-10-26 MED ORDER — ACETAMINOPHEN 325 MG PO TABS
650.0000 mg | ORAL_TABLET | Freq: Once | ORAL | Status: AC
  Administered 2020-10-26: 650 mg via ORAL
  Filled 2020-10-26: qty 2

## 2020-10-26 MED ORDER — POTASSIUM CHLORIDE CRYS ER 20 MEQ PO TBCR
40.0000 meq | EXTENDED_RELEASE_TABLET | Freq: Once | ORAL | Status: AC
  Administered 2020-10-26: 40 meq via ORAL
  Filled 2020-10-26: qty 2

## 2020-10-26 NOTE — Patient Instructions (Signed)
[facility]  Discharge Instructions: Thank you for choosing [facility] to provide your oncology and hematology care.   If you have a lab appointment with the [facility], please go directly to the [facility] and check in at the registration area.   Wear comfortable clothing and clothing appropriate for easy access to any Portacath or PICC line.   We strive to give you quality time with your provider. You may need to reschedule your appointment if you arrive late (15 or more minutes).  Arriving late affects you and other patients whose appointments are after yours.  Also, if you miss three or more appointments without notifying the office, you may be dismissed from the clinic at the provider's discretion.      For prescription refill requests, have your pharmacy contact our office and allow 72 hours for refills to be completed.    Today you received the following chemotherapy and/or immunotherapy agents Rituxan, cytoxan, vincristine, and etoposide      To help prevent nausea and vomiting after your treatment, we encourage you to take your nausea medication as directed.  BELOW ARE SYMPTOMS THAT SHOULD BE REPORTED IMMEDIATELY: *FEVER GREATER THAN 100.4 F (38 C) OR HIGHER *CHILLS OR SWEATING *NAUSEA AND VOMITING THAT IS NOT CONTROLLED WITH YOUR NAUSEA MEDICATION *UNUSUAL SHORTNESS OF BREATH *UNUSUAL BRUISING OR BLEEDING *URINARY PROBLEMS (pain or burning when urinating, or frequent urination) *BOWEL PROBLEMS (unusual diarrhea, constipation, pain near the anus) TENDERNESS IN MOUTH AND THROAT WITH OR WITHOUT PRESENCE OF ULCERS (sore throat, sores in mouth, or a toothache) UNUSUAL RASH, SWELLING OR PAIN  UNUSUAL VAGINAL DISCHARGE OR ITCHING   Items with * indicate a potential emergency and should be followed up as soon as possible or go to the Emergency Department if any problems should occur.  Please show the CHEMOTHERAPY ALERT CARD or  IMMUNOTHERAPY ALERT CARD at check-in to the Emergency Department and triage nurse.  Should you have questions after your visit or need to cancel or reschedule your appointment, please contact [facility]  Dept: [phone]  and follow the prompts.  Office hours are 8:00 a.m. to 4:30 p.m. Monday - Friday. Please note that voicemails left after 4:00 p.m. may not be returned until the following business day.  We are closed weekends and major holidays. You have access to a nurse at all times for urgent questions. Please call the main number to the clinic Dept: [phone] and follow the prompts.   For any non-urgent questions, you may also contact your provider using MyChart. We now offer e-Visits for anyone 77 and older to request care online for non-urgent symptoms. For details visit [email or web address].   Also download the MyChart app! Go to the app store, search "MyChart", open the app, select [facility], and log in with your MyChart username and password.  Due to Covid, a mask is required upon entering the hospital/clinic. If you do not have a mask, one will be given to you upon arrival. For doctor visits, patients may have 1 support person aged 51 or older with them. For treatment visits, patients cannot have anyone with them due to current Covid guidelines and our immunocompromised population.

## 2020-10-27 ENCOUNTER — Ambulatory Visit (HOSPITAL_COMMUNITY): Payer: Medicare HMO | Attending: Cardiology

## 2020-10-27 ENCOUNTER — Telehealth: Payer: Self-pay | Admitting: Hematology

## 2020-10-27 ENCOUNTER — Inpatient Hospital Stay: Payer: Medicare HMO

## 2020-10-27 VITALS — BP 94/66 | HR 95 | Temp 98.1°F | Resp 18

## 2020-10-27 DIAGNOSIS — I5022 Chronic systolic (congestive) heart failure: Secondary | ICD-10-CM | POA: Insufficient documentation

## 2020-10-27 DIAGNOSIS — Z5112 Encounter for antineoplastic immunotherapy: Secondary | ICD-10-CM | POA: Diagnosis not present

## 2020-10-27 DIAGNOSIS — Z7189 Other specified counseling: Secondary | ICD-10-CM

## 2020-10-27 DIAGNOSIS — C8330 Diffuse large B-cell lymphoma, unspecified site: Secondary | ICD-10-CM

## 2020-10-27 DIAGNOSIS — Z5189 Encounter for other specified aftercare: Secondary | ICD-10-CM | POA: Diagnosis not present

## 2020-10-27 DIAGNOSIS — Z79899 Other long term (current) drug therapy: Secondary | ICD-10-CM | POA: Diagnosis not present

## 2020-10-27 DIAGNOSIS — Z5111 Encounter for antineoplastic chemotherapy: Secondary | ICD-10-CM | POA: Diagnosis not present

## 2020-10-27 DIAGNOSIS — C8335 Diffuse large B-cell lymphoma, lymph nodes of inguinal region and lower limb: Secondary | ICD-10-CM | POA: Diagnosis not present

## 2020-10-27 LAB — ECHOCARDIOGRAM COMPLETE
Area-P 1/2: 4.57 cm2
MV M vel: 3.32 m/s
MV Peak grad: 44.1 mmHg
P 1/2 time: 217 msec
Radius: 0.67 cm
S' Lateral: 5.7 cm

## 2020-10-27 MED ORDER — SODIUM CHLORIDE 0.9 % IV SOLN: Freq: Once | INTRAVENOUS | Status: AC

## 2020-10-27 MED ORDER — PROCHLORPERAZINE MALEATE 10 MG PO TABS
10.0000 mg | ORAL_TABLET | Freq: Once | ORAL | Status: AC
  Administered 2020-10-27: 10 mg via ORAL
  Filled 2020-10-27: qty 1

## 2020-10-27 MED ORDER — SODIUM CHLORIDE 0.9 % IV SOLN
35.0000 mg/m2 | Freq: Once | INTRAVENOUS | Status: AC
  Administered 2020-10-27: 80 mg via INTRAVENOUS
  Filled 2020-10-27: qty 4

## 2020-10-27 NOTE — Patient Instructions (Signed)
[facility]  Discharge Instructions: Thank you for choosing [facility] to provide your oncology and hematology care.   If you have a lab appointment with the [facility], please go directly to the Cancer Center and check in at the registration area.   Wear comfortable clothing and clothing appropriate for easy access to any Portacath or PICC line.   We strive to give you quality time with your provider. You may need to reschedule your appointment if you arrive late (15 or more minutes).  Arriving late affects you and other patients whose appointments are after yours.  Also, if you miss three or more appointments without notifying the office, you may be dismissed from the clinic at the provider's discretion.      For prescription refill requests, have your pharmacy contact our office and allow 72 hours for refills to be completed.    Today you received the following chemotherapy and/or immunotherapy agents: etoposide.     To help prevent nausea and vomiting after your treatment, we encourage you to take your nausea medication as directed.  BELOW ARE SYMPTOMS THAT SHOULD BE REPORTED IMMEDIATELY: . *FEVER GREATER THAN 100.4 F (38 C) OR HIGHER . *CHILLS OR SWEATING . *NAUSEA AND VOMITING THAT IS NOT CONTROLLED WITH YOUR NAUSEA MEDICATION . *UNUSUAL SHORTNESS OF BREATH . *UNUSUAL BRUISING OR BLEEDING . *URINARY PROBLEMS (pain or burning when urinating, or frequent urination) . *BOWEL PROBLEMS (unusual diarrhea, constipation, pain near the anus) . TENDERNESS IN MOUTH AND THROAT WITH OR WITHOUT PRESENCE OF ULCERS (sore throat, sores in mouth, or a toothache) . UNUSUAL RASH, SWELLING OR PAIN  . UNUSUAL VAGINAL DISCHARGE OR ITCHING   Items with * indicate a potential emergency and should be followed up as soon as possible or go to the Emergency Department if any problems should occur.  Please show the CHEMOTHERAPY ALERT CARD or IMMUNOTHERAPY ALERT  CARD at check-in to the Emergency Department and triage nurse.  Should you have questions after your visit or need to cancel or reschedule your appointment, please contact [facility]  Dept: [phone]  and follow the prompts.  Office hours are 8:00 a.m. to 4:30 p.m. Monday - Friday. Please note that voicemails left after 4:00 p.m. may not be returned until the following business day.  We are closed weekends and major holidays. You have access to a nurse at all times for urgent questions. Please call the main number to the clinic Dept: [phone] and follow the prompts.   For any non-urgent questions, you may also contact your provider using MyChart. We now offer e-Visits for anyone 18 and older to request care online for non-urgent symptoms. For details visit [email or web address].   Also download the MyChart app! Go to the app store, search "MyChart", open the app, select [facility], and log in with your MyChart username and password.  Due to Covid, a mask is required upon entering the hospital/clinic. If you do not have a mask, one will be given to you upon arrival. For doctor visits, patients may have 1 support person aged 18 or older with them. For treatment visits, patients cannot have anyone with them due to current Covid guidelines and our immunocompromised population.   

## 2020-10-27 NOTE — Telephone Encounter (Signed)
Left message with follow-up appointment per 10/3 los. 

## 2020-10-28 ENCOUNTER — Other Ambulatory Visit: Payer: Self-pay | Admitting: Hematology

## 2020-10-28 ENCOUNTER — Inpatient Hospital Stay: Payer: Medicare HMO

## 2020-10-28 ENCOUNTER — Other Ambulatory Visit: Payer: Self-pay

## 2020-10-28 VITALS — BP 102/66 | HR 98 | Temp 98.2°F | Resp 18

## 2020-10-28 DIAGNOSIS — C8335 Diffuse large B-cell lymphoma, lymph nodes of inguinal region and lower limb: Secondary | ICD-10-CM | POA: Diagnosis not present

## 2020-10-28 DIAGNOSIS — Z5189 Encounter for other specified aftercare: Secondary | ICD-10-CM | POA: Diagnosis not present

## 2020-10-28 DIAGNOSIS — Z5111 Encounter for antineoplastic chemotherapy: Secondary | ICD-10-CM | POA: Diagnosis not present

## 2020-10-28 DIAGNOSIS — C8330 Diffuse large B-cell lymphoma, unspecified site: Secondary | ICD-10-CM

## 2020-10-28 DIAGNOSIS — Z79899 Other long term (current) drug therapy: Secondary | ICD-10-CM | POA: Diagnosis not present

## 2020-10-28 DIAGNOSIS — Z5112 Encounter for antineoplastic immunotherapy: Secondary | ICD-10-CM | POA: Diagnosis not present

## 2020-10-28 DIAGNOSIS — Z7189 Other specified counseling: Secondary | ICD-10-CM

## 2020-10-28 LAB — URINALYSIS, COMPLETE (UACMP) WITH MICROSCOPIC
Bilirubin Urine: NEGATIVE
Glucose, UA: >=500 mg/dL — AB
Ketones, ur: NEGATIVE mg/dL
Nitrite: NEGATIVE
Protein, ur: 100 mg/dL — AB
RBC / HPF: >50 RBC/hpf — ABNORMAL HIGH (ref 0–5)
Specific Gravity, Urine: 1.012 (ref 1.005–1.030)
WBC, UA: >50 WBC/hpf — ABNORMAL HIGH (ref 0–5)
pH: 5 (ref 5.0–8.0)

## 2020-10-28 MED ORDER — SODIUM CHLORIDE 0.9 % IV SOLN
35.0000 mg/m2 | Freq: Once | INTRAVENOUS | Status: AC
  Administered 2020-10-28: 80 mg via INTRAVENOUS
  Filled 2020-10-28: qty 4

## 2020-10-28 MED ORDER — PROCHLORPERAZINE MALEATE 10 MG PO TABS
10.0000 mg | ORAL_TABLET | Freq: Once | ORAL | Status: AC
  Administered 2020-10-28: 10 mg via ORAL
  Filled 2020-10-28: qty 1

## 2020-10-28 MED ORDER — SODIUM CHLORIDE 0.9 % IV SOLN
Freq: Once | INTRAVENOUS | Status: AC
Start: 2020-10-28 — End: 2020-10-28

## 2020-10-28 NOTE — Progress Notes (Signed)
Patient reported decrease urination and pain when urinated. This RN reported issues to Dr. Irene Limbo. UA Ucx ordered.

## 2020-10-29 ENCOUNTER — Ambulatory Visit (HOSPITAL_COMMUNITY): Admission: RE | Admit: 2020-10-29 | Payer: Medicare HMO | Source: Ambulatory Visit

## 2020-10-29 ENCOUNTER — Ambulatory Visit (HOSPITAL_COMMUNITY): Payer: Medicare HMO

## 2020-10-29 ENCOUNTER — Other Ambulatory Visit: Payer: Self-pay | Admitting: Hematology

## 2020-10-29 MED ORDER — CEFPODOXIME PROXETIL 100 MG PO TABS: 100.0000 mg | ORAL_TABLET | Freq: Two times a day (BID) | ORAL | 0 refills | Status: AC

## 2020-10-29 NOTE — Progress Notes (Signed)
Contacted pt per Dr Irene Limbo: -let Roy [name] know I did send in a prescription for Vantin for antibiotic treatment of likely urinary tract infection pending his urine culture results.  He should pick this up unless his urologist has also sent in an antibiotic.  If he is still having urinary obstruction symptoms he should get in touch with his urologist to determine if he needs a catheter or any adjustment to his urologic medications.  Pt acknowledged. Pt has contacted Urology but they have not prescribed an abx at this pending what Dr Irene Limbo is doing Pt verbalized understanding of above and requested U/A results be faxed to Urology.

## 2020-10-30 ENCOUNTER — Inpatient Hospital Stay: Payer: Medicare HMO

## 2020-10-30 ENCOUNTER — Other Ambulatory Visit: Payer: Self-pay

## 2020-10-30 DIAGNOSIS — Z7189 Other specified counseling: Secondary | ICD-10-CM

## 2020-10-30 DIAGNOSIS — Z5111 Encounter for antineoplastic chemotherapy: Secondary | ICD-10-CM | POA: Diagnosis not present

## 2020-10-30 DIAGNOSIS — Z5189 Encounter for other specified aftercare: Secondary | ICD-10-CM | POA: Diagnosis not present

## 2020-10-30 DIAGNOSIS — C8335 Diffuse large B-cell lymphoma, lymph nodes of inguinal region and lower limb: Secondary | ICD-10-CM | POA: Diagnosis not present

## 2020-10-30 DIAGNOSIS — Z79899 Other long term (current) drug therapy: Secondary | ICD-10-CM | POA: Diagnosis not present

## 2020-10-30 DIAGNOSIS — Z5112 Encounter for antineoplastic immunotherapy: Secondary | ICD-10-CM | POA: Diagnosis not present

## 2020-10-30 DIAGNOSIS — C8330 Diffuse large B-cell lymphoma, unspecified site: Secondary | ICD-10-CM

## 2020-10-30 MED ORDER — PEGFILGRASTIM-CBQV 6 MG/0.6ML ~~LOC~~ SOSY
6.0000 mg | PREFILLED_SYRINGE | Freq: Once | SUBCUTANEOUS | Status: AC
  Administered 2020-10-30: 6 mg via SUBCUTANEOUS
  Filled 2020-10-30: qty 0.6

## 2020-10-31 LAB — URINE CULTURE: Culture: >=100000 — AB

## 2020-11-01 NOTE — Progress Notes (Signed)
Cardiology Office Note:   Date:  11/03/2020  NAME:  Roy [name]    MRN: [id number] DOB:  08-23-1945   PCP:  [name], DO  Cardiologist:  [name], MD  Electrophysiologist:  None   Referring MD: [name], DO   Chief Complaint  Patient presents with   Follow-up    History of Present Illness:   Roy [name] is a 75 y.o. 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 EF has not recovered. Has active cancer, primary testicular B cell lymphoma.  Blood pressure 88/55.  He informs me he has been treated for a UTI.  He denies any significant dizziness or lightheadedness.  He can get dizzy upon standing.  His EF has not recovered.  EF 20-25%.  EKG in office demonstrates sinus tachycardia heart rate 100.  He has anterolateral T wave inversions which are unchanged.  He denies any chest pain.  His weights are up nearly 14 pounds.  He is watching his salt intake.  We discussed switching to torsemide.  Given that his EF has not recovered and his BP has been low I think digoxin is warranted.  We did discuss that he likely needs an ICD.  We will need his cancer to be fully treated before we consider him for this.  It looks like he will have full remission.  Per notes from oncology this is very treatable.  I do have concerns about his low blood pressure and EF that has not improved.  We discussed advanced heart failure referral.  He is open to this.  He does have plans to start working out at the gym.  He completed cardiac rehab but has not done much since that time.  He reports he felt better doing cardiac rehab.  Problem List 1. 3vCAD -CABG x 4 03/25/2020 (LIMA-LAD, RIMA-PDA, radial artery OM1/2) 2. Systolic HF, ischemic -08/6759: 25-30% -4//2022: 20-25% -10/2020: 20-25% 3. DM -A1c 6.2 4. HLD -T chol 140, HDL 36, LDL 74, TG 148 5. Primary testicular large B-cell lymphoma   Past Medical History: Past Medical History:  Diagnosis Date    Allergic rhinitis    Arthritis    wrists   Cardiomyopathy, nonischemic ([place])    followed by cardiology--- dr [name]---  2016 ef 45-50% ,  2016 nuclear ef 37%,  2017 per echo ef 50-55%   CHF (congestive heart failure), NYHA class III ([place]) 03/21/2020   Coronary artery disease    GERD (gastroesophageal reflux disease)    Hiatal hernia    History of kidney stones    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;   01/ 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   History of syncope (03-06-2020 pt stated has not had sycopal episode in few years, stated it seems to happen in extreme hot conditions)   cardiologist--- dr [name]--- dx recurrent syncope;  nuclear study 11-03-2014 intermediate risk w/ no ischemia, apical hypokinesis, nuclear ef 37%;  event monitor-- 12-21-2015 SB/ ST  no pauses/ arrythmia's;  echo 08-03-2015 ef 50-55%   Hyperlipidemia    Hypertension    followed by pcp   Hypovitaminosis D    Mass of left testicle    Nocturia    Plantar fasciitis    Presence of surgical incision    01/ 2022  moh's w/ skin graft of nose, per pt still healing and wear bandage daily  Type 2 diabetes mellitus (HCC)    pt is adament that he is not and have been told he is a diabetic but a borderline;  followed by pcp, in pcp note states DM2 and takes 2 meds daily   Wears glasses    Wears hearing aid in both ears     Past Surgical History: Past Surgical History:  Procedure Laterality Date   COLONOSCOPY  last one 01-30-2017   CORONARY ARTERY BYPASS GRAFT N/A 03/25/2020   Procedure: CORONARY ARTERY BYPASS GRAFTING (CABG), ON PUMP, TIMES FOUR, USING BILATERAL INTERNAL MAMMARY ARTERIES AND LEFT RADIAL ARTERY;  Surgeon: Linden Dolin, MD;  Location: [facility] OR;  Service: Open Heart Surgery;  Laterality: N/A;   IR IMAGING GUIDED PORT INSERTION  07/06/2020   LOW ANTERIOR RESECTION RECTUM W/ COLOPROCTOSTOMY  05/2003   MOHS SURGERY  01/2020   nose w/ graft   ORCHIECTOMY Left  03/11/2020   Procedure: Clearance Coots;  Surgeon: Rene Paci, MD;  Location: [facility];  Service: Urology;  Laterality: Left;  ONLY NEEDS 60 MIN   RADIAL ARTERY HARVEST Left 03/25/2020   Procedure: LEFT RADIAL ARTERY HARVEST;  Surgeon: Linden Dolin, MD;  Location: [facility] OR;  Service: Open Heart Surgery;  Laterality: Left;   RIGHT/LEFT HEART CATH AND CORONARY ANGIOGRAPHY N/A 03/23/2020   Procedure: RIGHT/LEFT HEART CATH AND CORONARY ANGIOGRAPHY;  Surgeon: Corky Crafts, MD;  Location: [facility] INVASIVE CV LAB;  Service: Cardiovascular;  Laterality: N/A;   SHOULDER SURGERY Right 1992; 07/ 2021   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 Left 12/24/2008   left ear and nose    TEE WITHOUT CARDIOVERSION N/A 03/25/2020   Procedure: TRANSESOPHAGEAL ECHOCARDIOGRAM (TEE);  Surgeon: Linden Dolin, MD;  Location: [facility] OR;  Service: Open Heart Surgery;  Laterality: N/A;   TONSILLECTOMY AND ADENOIDECTOMY  child   UPPER GASTROINTESTINAL ENDOSCOPY  last one 06-06-2017    Current Medications: Current Meds  Medication Sig   acetaminophen (TYLENOL) 325 MG tablet Take 2 tablets (650 mg total) by mouth every 4 (four) hours as needed for headache or mild pain.   Apoaequorin (PREVAGEN PO) Take 1 tablet by mouth at bedtime.   aspirin EC 81 MG tablet Take 81 mg by mouth daily. Swallow whole.   cefpodoxime (VANTIN) 100 MG tablet Take 1 tablet (100 mg total) by mouth 2 (two) times daily for 7 days.   cholecalciferol (VITAMIN D) 1000 UNITS tablet Take 2,000 Units by mouth See admin instructions. Mon-friday   digoxin (LANOXIN) 0.125 MG tablet Take 1 tablet (0.125 mg total) by mouth daily.   empagliflozin (JARDIANCE) 10 MG TABS tablet Take 1 tablet (10 mg total) by mouth daily before breakfast.   ENTRESTO 24-26 MG TAKE 1 TABLET BY MOUTH 2 TIMES DAILY.   glucosamine-chondroitin 500-400 MG tablet Take 1 tablet by mouth. 5 times weekly   LORazepam (ATIVAN) 0.5 MG tablet Take  1 tablet (0.5 mg total) by mouth every 6 (six) hours as needed for anxiety. Put 1 pill under your tongue every 6 hrs, if needed, for nausea   metFORMIN (GLUCOPHAGE) 1000 MG tablet Take 1,000 mg by mouth 2 (two) times daily with a meal.    metoprolol (TOPROL XL) 200 MG 24 hr tablet Take 1 tablet (200 mg total) by mouth daily.   Multiple Vitamin (MULTIVITAMIN WITH MINERALS) TABS tablet Take 1 tablet by mouth. 5 times weekly   omeprazole (PRILOSEC) 20 MG capsule Take 20 mg by mouth daily.  ondansetron (ZOFRAN) 8 MG tablet Take 1 tablet (8 mg total) by mouth every 8 (eight) hours as needed for nausea or vomiting. Take 1 pill every 8 hrs for 3 days.   potassium chloride SA (KLOR-CON M20) 20 MEQ tablet Take 1 tablet (20 mEq total) by mouth daily.   prochlorperazine (COMPAZINE) 10 MG tablet Take 1 tablet (10 mg total) by mouth every 8 (eight) hours as needed for nausea or vomiting.   spironolactone (ALDACTONE) 25 MG tablet Take 0.5 tablets (12.5 mg total) by mouth daily.   tamsulosin (FLOMAX) 0.4 MG CAPS capsule Take 0.4 mg by mouth at bedtime.   torsemide (DEMADEX) 20 MG tablet Take 2 tablets (40 mg total) by mouth 2 (two) times daily.   vitamin B-12 (CYANOCOBALAMIN) 1000 MCG tablet Take 1,000 mcg by mouth. 5 times a week   [DISCONTINUED] furosemide (LASIX) 80 MG tablet Take 0.5 tablets (40 mg total) by mouth daily. (Patient taking differently: Take 80 mg by mouth daily.)   Current Facility-Administered Medications for the 11/03/20 encounter (Office Visit) with Geralynn Rile, MD  Medication   potassium chloride (KLOR-CON) CR tablet 20 mEq     Allergies:    Patient has no known allergies.   Social History: Social History   Socioeconomic History   Marital status: Married    Spouse name: Not on file   Number of children: Not on file   Years of education: Not on file   Highest education level: Not on file  Occupational History   Not on file  Tobacco Use   Smoking status: Former     Types: Pipe    Quit date: 11/29/1976    Years since quitting: 43.9   Smokeless tobacco: Never  Vaping Use   Vaping Use: Never used  Substance and Sexual Activity   Alcohol use: Yes    Alcohol/week: 0.0 standard drinks    Comment: Occasional drink    Drug use: Never   Sexual activity: Not on file  Other Topics Concern   Not on file  Social History Narrative   Not on file   Social Determinants of Health   Financial Resource Strain: Not on file  Food Insecurity: Not on file  Transportation Needs: Not on file  Physical Activity: Not on file  Stress: Not on file  Social Connections: Not on file     Family History: The patient's family history includes Heart attack in his mother; Stroke in his father. There is no history of Colon cancer, Esophageal cancer, Pancreatic cancer, Rectal cancer, Stomach cancer, or Colon polyps.  ROS:   All other ROS reviewed and negative. Pertinent positives noted in the HPI.     EKGs/Labs/Other Studies Reviewed:   The following studies were personally reviewed by me today:  EKG:  EKG is ordered today.  The ekg ordered today demonstrates sinus tachycardia heart rate 100, nonspecific ST-T changes, anterolateral T wave inversions noted, and was personally reviewed by me.   TTE 10/27/2020  1. Left ventricular ejection fraction, by estimation, is 20 to 25%. The  left ventricle has severely decreased function. There is akinesis of the  inferior and inferoseptal walls with severe hypokinesis in the remaining  myocardial wall segments. The left  ventricular internal cavity size was mildly dilated. Left ventricular  diastolic parameters are consistent with Grade III diastolic dysfunction  (restrictive). Elevated left atrial pressure. The average left ventricular  global longitudinal strain is -9.2  %. The global longitudinal strain is abnormal.   2. Right  ventricular systolic function is normal. The right ventricular  size is normal. There is normal  pulmonary artery systolic pressure.   3. Left atrial size was severely dilated.   4. Right atrial size was mildly dilated.   5. The mitral valve is normal in structure. Moderate centrally directed  mitral valve regurgitation. No evidence of mitral stenosis.   6. Tricuspid valve regurgitation is mild to moderate.   7. The aortic valve is normal in structure. Aortic valve regurgitation is  mild. No aortic stenosis is present.   8. There is mild dilatation of the ascending aorta, measuring 39 mm.   9. The inferior vena cava is normal in size with greater than 50%  respiratory variability, suggesting right atrial pressure of 3 mmHg.   Recent Labs: 03/21/2020: B Natriuretic Peptide 648.5 03/31/2020: Magnesium 1.6 10/26/2020: ALT 18; BUN 19; Creatinine 1.26; Hemoglobin 9.0; Platelet Count 204; Potassium 3.2; Sodium 144   Recent Lipid Panel    Component Value Date/Time   CHOL 128 07/03/2020 1028   TRIG 124 07/03/2020 1028   HDL 41 07/03/2020 1028   CHOLHDL 3.1 07/03/2020 1028   CHOLHDL 3.9 03/21/2020 1147   VLDL 30 03/21/2020 1147   LDLCALC 65 07/03/2020 1028    Physical Exam:   VS:  BP (!) 88/55   Pulse 100   Ht $R'6\' 1"'vy$  (1.854 m)   Wt 211 lb 9.6 oz (96 kg)   SpO2 100%   BMI 27.92 kg/m    Wt Readings from Last 3 Encounters:  11/03/20 211 lb 9.6 oz (96 kg)  10/26/20 197 lb 12 oz (89.7 kg)  10/05/20 207 lb (93.9 kg)    General: Well nourished, well developed, in no acute distress Head: Atraumatic, normal size  Eyes: PEERLA, EOMI  Neck: Supple, no JVD Endocrine: No thryomegaly Cardiac: Normal S1, S2; RRR; no murmurs, rubs, or gallops Lungs: Clear to auscultation bilaterally, no wheezing, rhonchi or rales  Abd: Soft, nontender, no hepatomegaly  Ext: 2+ pitting edema to the knees Musculoskeletal: No deformities, BUE and BLE strength normal and equal Skin: Warm and dry, no rashes   Neuro: Alert and oriented to person, place, time, and situation, CNII-XII grossly intact, no focal  deficits  Psych: Normal mood and affect   ASSESSMENT:   Roy [name] is a 75 y.o. male who presents for the following: 1. Chronic systolic heart failure ([place])   2. Coronary artery disease involving coronary bypass graft of native heart without angina pectoris   3. Mixed hyperlipidemia   4. Essential hypertension    PLAN:   1. Chronic systolic heart failure ([place]) -He has a known ischemic cardiomyopathy.  He suffered a non-STEMI in March 2022.  Found to have three-vessel CAD.  He underwent CABG.  EF has not recovered.  He has significant akinesis of the inferior and inferior septal segments.  He has global hypokinesis in other segments.  He has had over 6 months of guideline directed medical therapy.  I really believe his EF will not recover. -Blood pressure is a bit low in office today.  He has been treated for UTI.  His BP is never really been above 361 systolically.  We will start him on digoxin 0.125 mg daily.  He does not appear toxic.  He appears quite stable. -He is volume up at least 14 pounds.  We will switch him to torsemide 40 mg daily.  We will also add potassium supplementation with this. -If symptoms do not improve and he cannot  remove fluid with the addition of torsemide to his regimen I have instructed him to call us back.  He may need admission to the hospital if he continues to remain volume overloaded.  Blood pressure is a bit concerning but he appears to be stable without major symptoms.  We will keep a close eye on this. -Most recent kidney profile shows EGFR 59.  He will have lab work done next week by his oncologist.  We will add on a BMP. -He has remained on metoprolol succinate 200 mg daily, Entresto 24-26 mg twice daily, Aldactone 12.5 mg daily.  He is also on Jardiance 10 mg daily.  We will continue this regimen.  BP will not tolerate further titration. -I did discuss them that his EF is not recovered.  I do not believe it will recover.  We discussed referral to advanced  heart failure.  I think he needs to be evaluated by them given his low blood pressure as well as limited ability to tolerate other guideline directed medical therapy.  Digoxin will likely help but should he not improve he may need to be considered for LVAD.  Right now he does have active malignancy.  It appears his oncologist expects this to be fully treated.  We will hold on ICD for now.  I did discuss this with him.  After he completes his intrathecal infusions and repeat PET scans continue to show no disease I think he should be evaluated for an ICD.  I suspect his life expectancy from cancer will not be limited as his most recent images show no recurrence of disease.  We will let him complete therapy and then have this discussion.  2. Coronary artery disease involving coronary bypass graft of native heart without angina pectoris 3. Mixed hyperlipidemia -Status post four-vessel CABG.  Continue aspirin 81 mg daily.  Continue Lipitor 40 mg daily.  Most recent LDL cholesterol is at goal.  Disposition: Return in about 3 months (around 02/03/2021).  Medication Adjustments/Labs and Tests Ordered: Current medicines are reviewed at length with the patient today.  Concerns regarding medicines are outlined above.  Orders Placed This Encounter  Procedures   Basic metabolic panel   AMB referral to CHF clinic   EKG 12-Lead   Meds ordered this encounter  Medications   torsemide (DEMADEX) 20 MG tablet    Sig: Take 2 tablets (40 mg total) by mouth 2 (two) times daily.    Dispense:  180 tablet    Refill:  3   potassium chloride SA (KLOR-CON M20) 20 MEQ tablet    Sig: Take 1 tablet (20 mEq total) by mouth daily.    Dispense:  90 tablet    Refill:  3   digoxin (LANOXIN) 0.125 MG tablet    Sig: Take 1 tablet (0.125 mg total) by mouth daily.    Dispense:  90 tablet    Refill:  3    Patient Instructions  Medication Instructions:  STOP Lasix START Torsemide 40 mg daily (take 2 tablets of 20 mg) START  Potassium 20 meq daily  START Digoxin 0.125 mg daily   *If you need a refill on your cardiac medications before your next appointment, please call your pharmacy*   Lab Work: BMET (complete at [facility])  If you have labs (blood work) drawn today and your tests are completely normal, you will receive your results only by: [place] (if you have MyChart) OR A paper copy in the mail If you have any  lab test that is abnormal or we need to change your treatment, we will call you to review the results.   Follow-Up: At [facility], you and your health needs are our priority.  As part of our continuing mission to provide you with exceptional heart care, we have created designated Provider Care Teams.  These Care Teams include your primary Cardiologist (physician) and Advanced Practice Providers (APPs -  Physician Assistants and Nurse Practitioners) who all work together to provide you with the care you need, when you need it.  We recommend signing up for the patient portal called "MyChart".  Sign up information is provided on this After Visit Summary.  MyChart is used to connect with patients for Virtual Visits (Telemedicine).  Patients are able to view lab/test results, encounter notes, upcoming appointments, etc.  Non-urgent messages can be sent to your provider as well.   To learn more about what you can do with MyChart, go to [email or web address].    Your next appointment:   3 month(s)  The format for your next appointment:   In Person  Provider:   [name], MD   Other Instructions Referral to [facility]- They will contact you to set up an appointment.    Time Spent with Patient: I have spent a total of 35 minutes with patient reviewing hospital notes, telemetry, EKGs, labs and examining the patient as well as establishing an assessment and plan that was discussed with the patient.  > 50% of time was spent in direct patient  care.  Signed, Addison Naegeli. [name], MD, [place]  [address], [place], [place] [address] [phone]  11/03/2020 10:34 AM

## 2020-11-02 ENCOUNTER — Ambulatory Visit (HOSPITAL_COMMUNITY)
Admission: RE | Admit: 2020-11-02 | Discharge: 2020-11-02 | Disposition: A | Discharge status: Home / Self Care | Payer: Medicare HMO | Source: Ambulatory Visit | Attending: Hematology | Admitting: Hematology

## 2020-11-02 ENCOUNTER — Encounter: Payer: Self-pay | Admitting: Hematology

## 2020-11-02 DIAGNOSIS — M7989 Other specified soft tissue disorders: Secondary | ICD-10-CM | POA: Diagnosis not present

## 2020-11-02 NOTE — Progress Notes (Signed)
Roy [name]    HEMATOLOGY/ONCOLOGY CLINIC NOTE  Date of Service: .10/26/2020   Patient Care Team: Roy Morning, DO as PCP - General (Family Medicine) O'Neal, [name], MD as PCP - Cardiology (Cardiology)  CHIEF COMPLAINTS/PURPOSE OF CONSULTATION:  Primary testicular large B cell lymphoma  HISTORY OF PRESENTING ILLNESS:   Roy [name] is a wonderful 75 y.o. male who has been referred to [place] by Roy [name] for evaluation and management of primary testicular large B-cell lymphoma  Patient has a history of hypertension, borderline diabetes, previous nonischemic cardiomyopathy but with a recent MI,, CHF who presented with a left testicular swelling end of sept/ early oct 2021.  He notes that he was seen by Urology - Roy [name] and had an ultrasound--possible infection was suspected and he received 2 rounds of abx without significant improvement.  He notes he received 2 subsequent follow-up ultrasounds in December in January and finally had left transinguinal orchiectomy on 03/11/2020. He notes he had persistent testicular swelling post operatively which required drainage.  He also notes having issues with urinary retention and had a urinary catheter for 2 weeks, abx+  Pathology showed primary testicular large B-cell lymphoma.  No other additional imaging studies for staging have been done at this time.  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  Patient was admitted to the hospital on 03/21/2020 with acute onset dyspnea epigastric pain and cough with pink frothy sputum.  He was noted to have hypoxia with oxygen saturation of 82% on arrival.  He was noted to have non-ST elevation MI on 2/26.  CTA chest ruled out PE.  His symptoms from fluid with aggressive diuresis.  He was noted to have severe triple-vessel disease on cath with an ejection fraction of 25 to 35% with no significant valvular lesions.  He subsequently had CORONARY ARTERY BYPASS  GRAFTING (CABG), ON PUMP, TIMES FOUR, USING BILATERAL INTERNAL MAMMARY ARTERIES AND LEFT RADIAL ARTERY .. On March 25, 2020.  Patient notes he is gradually recovering from his cardiac surgery notes being fatigued having gone through multiple urologic issues, skin surgery for squamous of carcinoma of the nose, acute MI CABG x4.  Patient notes no fevers no chills no night sweats. He notes he might have lost some weight but difficult to quantify in the setting of fluid overload and diuresis. Notes he is starting to eat a little better. Minimal left scrotal swelling. No other acute new focal symptoms.  INTERVAL HISTORY   Roy [name] is a wonderful 75 y.o. male who is here today for evaluation and management of Primary testicular large B cell lymphoma and prior to cycle 6 of R-CEOP chemotherapy The pt reports grade 1 fatigue. No fevers/n/v/d. No other acute new symptoms He was not scheduled by IR for his third dose of intrathecal methotrexate as requested on 9/9 but this was scheduled instead on 10/29/2020 but would be rescheduled for 11/16/2020 .   No fevers no chills no night sweats.  Has been having some issues with leg swelling and is on diuretics per cardiology.  Noted to have hypokalemia likely related to heavy diuretic use and was recommended to continue follow-up with cardiology to adjust his diuretics and continued potassium replacement.  He was given oral potassium replacement in the infusion room.  He notes some symptoms of urinary tract infection on date of the chemotherapy and was given oral antibiotics.  He is also following with urology for his urinary issues.  Labs done today 10/26/2020 were reviewed in details.  No other primary toxicities from his chemotherapy at this time.     MEDICAL HISTORY:  Past Medical History:  Diagnosis Date   Allergic rhinitis    Arthritis    wrists   Cardiomyopathy, nonischemic [facility])    followed by cardiology--- Roy Roy [name]---  2016 ef 45-50% ,   2016 nuclear ef 37%,  2017 per echo ef 50-55%   CHF (congestive heart failure), NYHA class III ([place]) 03/21/2020   Coronary artery disease    GERD (gastroesophageal reflux disease)    Hiatal hernia    History of kidney stones    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;   01/ 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   History of syncope (03-06-2020 pt stated has not had sycopal episode in few years, stated it seems to happen in extreme hot conditions)   cardiologist--- Roy Roy [name]--- dx recurrent syncope;  nuclear study 11-03-2014 intermediate risk w/ no ischemia, apical hypokinesis, nuclear ef 37%;  event monitor-- 12-21-2015 SB/ ST  no pauses/ arrythmia's;  echo 08-03-2015 ef 50-55%   Hyperlipidemia    Hypertension    followed by pcp   Hypovitaminosis D    Mass of left testicle    Nocturia    Plantar fasciitis    Presence of surgical incision    01/ 2022  moh's w/ skin graft of nose, per pt still healing and wear bandage daily   Type 2 diabetes mellitus ([place])    pt is adament that he is not and have been told he is a diabetic but a borderline;  followed by pcp, in pcp note states DM2 and takes 2 meds daily   Wears glasses    Wears hearing aid in both ears     SURGICAL HISTORY: Past Surgical History:  Procedure Laterality Date   COLONOSCOPY  last one 01-30-2017   CORONARY ARTERY BYPASS GRAFT N/A 03/25/2020   Procedure: CORONARY ARTERY BYPASS GRAFTING (CABG), ON PUMP, TIMES FOUR, USING BILATERAL INTERNAL MAMMARY ARTERIES AND LEFT RADIAL ARTERY;  Surgeon: Roy Olds, MD;  Location: [place];  Service: Open Heart Surgery;  Laterality: N/A;   IR IMAGING GUIDED PORT INSERTION  07/06/2020   LOW ANTERIOR RESECTION RECTUM W/ COLOPROCTOSTOMY  05/2003   MOHS SURGERY  01/2020   nose w/ graft   ORCHIECTOMY Left 03/11/2020   Procedure: Roy [name];  Surgeon: Roy Mons, MD;  Location: [facility];  Service: Urology;   Laterality: Left;  ONLY NEEDS 60 MIN   RADIAL ARTERY HARVEST Left 03/25/2020   Procedure: LEFT RADIAL ARTERY HARVEST;  Surgeon: Roy Olds, MD;  Location: [place];  Service: Open Heart Surgery;  Laterality: Left;   RIGHT/LEFT HEART CATH AND CORONARY ANGIOGRAPHY N/A 03/23/2020   Procedure: RIGHT/LEFT HEART CATH AND CORONARY ANGIOGRAPHY;  Surgeon: [name], MD;  Location: [place] [facility];  Service: Cardiovascular;  Laterality: N/A;   SHOULDER SURGERY Right 1992; 07/ 2021   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 Left 12/24/2008   left ear and nose    TEE WITHOUT CARDIOVERSION N/A 03/25/2020   Procedure: TRANSESOPHAGEAL ECHOCARDIOGRAM (TEE);  Surgeon: Roy Olds, MD;  Location: [place];  Service: Open Heart Surgery;  Laterality: N/A;   TONSILLECTOMY AND ADENOIDECTOMY  child   UPPER GASTROINTESTINAL ENDOSCOPY  last one 06-06-2017    SOCIAL HISTORY: Social History   Socioeconomic History  Marital status: Married    Spouse name: Not on file   Number of children: Not on file   Years of education: Not on file   Highest education level: Not on file  Occupational History   Not on file  Tobacco Use   Smoking status: Former    Types: Pipe    Quit date: 11/29/1976    Years since quitting: 43.9   Smokeless tobacco: Never  Vaping Use   Vaping Use: Never used  Substance and Sexual Activity   Alcohol use: Yes    Alcohol/week: 0.0 standard drinks    Comment: Occasional drink    Drug use: Never   Sexual activity: Not on file  Other Topics Concern   Not on file  Social History Narrative   Not on file   Social Determinants of Health   Financial Resource Strain: Not on file  Food Insecurity: Not on file  Transportation Needs: Not on file  Physical Activity: Not on file  Stress: Not on file  Social Connections: Not on file  Intimate Partner Violence: Not on file    FAMILY HISTORY: Family History  Problem Relation Age of Onset   Heart attack Mother     Stroke Father    Colon cancer Neg Hx    Esophageal cancer Neg Hx    Pancreatic cancer Neg Hx    Rectal cancer Neg Hx    Stomach cancer Neg Hx    Colon polyps Neg Hx     ALLERGIES:  has No Known Allergies.  MEDICATIONS:  Current Outpatient Medications  Medication Sig Dispense Refill   acetaminophen (TYLENOL) 325 MG tablet Take 2 tablets (650 mg total) by mouth every 4 (four) hours as needed for headache or mild pain.     Apoaequorin (PREVAGEN PO) Take 1 tablet by mouth at bedtime.     aspirin EC 81 MG tablet Take 81 mg by mouth daily. Swallow whole.     atorvastatin (LIPITOR) 40 MG tablet Take 1 tablet (40 mg total) by mouth daily. 90 tablet 3   cefpodoxime (VANTIN) 100 MG tablet Take 1 tablet (100 mg total) by mouth 2 (two) times daily for 7 days. 14 tablet 0   cholecalciferol (VITAMIN D) 1000 UNITS tablet Take 2,000 Units by mouth See admin instructions. Mon-friday     empagliflozin (JARDIANCE) 10 MG TABS tablet Take 1 tablet (10 mg total) by mouth daily before breakfast. 90 tablet 1   ENTRESTO 24-26 MG TAKE 1 TABLET BY MOUTH 2 TIMES DAILY. 60 tablet 3   furosemide (LASIX) 80 MG tablet Take 0.5 tablets (40 mg total) by mouth daily. 30 tablet 11   glucosamine-chondroitin 500-400 MG tablet Take 1 tablet by mouth. 5 times weekly     LORazepam (ATIVAN) 0.5 MG tablet Take 1 tablet (0.5 mg total) by mouth every 6 (six) hours as needed for anxiety. Put 1 pill under your tongue every 6 hrs, if needed, for nausea 30 tablet 0   metFORMIN (GLUCOPHAGE) 1000 MG tablet Take 1,000 mg by mouth 2 (two) times daily with a meal.      metoprolol (TOPROL XL) 200 MG 24 hr tablet Take 1 tablet (200 mg total) by mouth daily. 90 tablet 3   Multiple Vitamin (MULTIVITAMIN WITH MINERALS) TABS tablet Take 1 tablet by mouth. 5 times weekly     omeprazole (PRILOSEC) 20 MG capsule Take 20 mg by mouth daily.     ondansetron (ZOFRAN) 8 MG tablet Take 1 tablet (8 mg total) by  mouth every 8 (eight) hours as needed for  nausea or vomiting. Take 1 pill every 8 hrs for 3 days. 30 tablet 3   prochlorperazine (COMPAZINE) 10 MG tablet Take 1 tablet (10 mg total) by mouth every 8 (eight) hours as needed for nausea or vomiting. 40 tablet 2   spironolactone (ALDACTONE) 25 MG tablet Take 0.5 tablets (12.5 mg total) by mouth daily. 60 tablet 1   tamsulosin (FLOMAX) 0.4 MG CAPS capsule Take 0.4 mg by mouth at bedtime.     vitamin B-12 (CYANOCOBALAMIN) 1000 MCG tablet Take 1,000 mcg by mouth. 5 times a week     Current Facility-Administered Medications  Medication Dose Route Frequency Provider Last Rate Last Admin   potassium chloride (KLOR-CON) CR tablet 20 mEq  20 mEq Oral BID Roy Olds, MD        REVIEW OF SYSTEMS:   .10 Point review of Systems was done is negative except as noted above.   PHYSICAL EXAMINATION: ECOG PERFORMANCE STATUS: 2 - Symptomatic, <50% confined to bed  VS reviewed . GENERAL:alert, in no acute distress and comfortable SKIN: no acute rashes, no significant lesions EYES: conjunctiva are pink and non-injected, sclera anicteric OROPHARYNX: MMM, no exudates, no oropharyngeal erythema or ulceration NECK: supple, no JVD LYMPH:  no palpable lymphadenopathy in the cervical, axillary or inguinal regions LUNGS: clear to auscultation b/l with normal respiratory effort HEART: regular rate & rhythm ABDOMEN:  normoactive bowel sounds , non tender, not distended. Extremity: no pedal edema PSYCH: alert & oriented x 3 with fluent speech NEURO: no focal motor/sensory deficits    LABORATORY DATA:  I have reviewed the data as listed   CBC Latest Ref Rng & Units 10/26/2020 10/16/2020 10/05/2020  WBC 4.0 - 10.5 K/uL 30.0(H) 15.5(H) 17.9(H)  Hemoglobin 13.0 - 17.0 g/dL 9.0(L) 8.4(L) 9.1(L)  Hematocrit 39.0 - 52.0 % 28.8(L) 26.6(L) 28.7(L)  Platelets 150 - 400 K/uL 204 195 263    . CMP Latest Ref Rng & Units 10/26/2020 10/16/2020 10/06/2020  Glucose 70 - 99 mg/dL 225(H) 198(H) 126(H)  BUN 8 -  23 mg/dL $Remove'19 12 23  'mdaZSdW$ Creatinine 0.61 - 1.24 mg/dL 1.26(H) 1.22 1.30(H)  Sodium 135 - 145 mmol/L 144 143 147(H)  Potassium 3.5 - 5.1 mmol/L 3.2(L) 3.0(L) 3.8  Chloride 98 - 111 mmol/L 104 105 105  CO2 22 - 32 mmol/L $RemoveB'24 22 21  'kZAwuWCt$ Calcium 8.9 - 10.3 mg/dL 7.0(L) 7.9(L) 7.3(L)  Total Protein 6.5 - 8.1 g/dL 5.8(L) 6.0(L) -  Total Bilirubin 0.3 - 1.2 mg/dL 0.9 1.0 -  Alkaline Phos 38 - 126 U/L 106 113 -  AST 15 - 41 U/L 18 17 -  ALT 0 - 44 U/L 18 24 -   . Lab Results  Component Value Date   LDH 199 (H) 07/23/2020   SURGICAL PATHOLOGY   THIS IS AN ADDENDUM REPORT   CASE: [id number]  PATIENT: Oakley Mariotti  Surgical Pathology Report  Addendum    Reason for Addendum #1:  Molecular Genetic Test Results, FISH   Clinical History: Left testicular mass (crm)      FINAL MICROSCOPIC DIAGNOSIS:   A. TESTICLE, LEFT, ORCHIECTOMY:  -  Diffuse aggressive large B-cell lymphoma  -  See comment    COMMENT:   Sections of testicle show architectural effacement by sheets of large  lymphoid cells with vesicular nuclei and pale cytoplasm.  There is  admixed apoptotic debris and increased mitotic activity.  By  immunohistochemistry, the large lymphoid cells are positive  for CD20,  CD5 (dim), BCL6 (dim), MUM1, and BCL2.  They are negative for CD10, CD30  (<1%), cyclin D1, and TdT.  The Ki67 proliferation index is up to  approximately 70%.  CD3 highlights small T-cells in the background. EBV  is negative by in situ hybridization.   Together, the findings support the diagnosis of a diffuse aggressive  large B-cell lymphoma. The differential diagnosis includes a diffuse  large B-cell lymphoma, NOS, with activated B-cell subtype by the [facility]  algorithm and high-grade B-cell lymphoma with MYC and BCL2 or BCL6  rearrangements.  FISH for BCL2, BCL6, and MYC rearrangements will be  performed. Correlation with clinical and radiographic findings is  recommended for consideration of a primary testicular  lymphoma.   Result reported to L. Gibson on 03/16/20 at 1720 by S. O'Neill.    ADDENDUM:   FISH RESULTS:   Results: NORMAL   Interpretation:   BCL6 rearrangement:      Not Detected  MYC rearrangement:  Not Detected  MYC amplification:  Not Detected  BCL6 rearrangement:      Not Detected   Please note this testing was performed and interpreted by an outside  facility ([facility]).  This addendum is only being added to provide a  summary of the results for report completeness.  Please see electronic  medical record for a copy of the full report.   RADIOGRAPHIC STUDIES: I have personally reviewed the radiological images as listed and agreed with the findings in the report. ECHOCARDIOGRAM COMPLETE  Result Date: 10/27/2020    ECHOCARDIOGRAM REPORT   Patient Name:   WINSON EICHORN Date of Exam: 10/27/2020 Medical Rec #:  [id number]     Height:       72.0 in Accession #:    [id number]    Weight:       197.7 lb Date of Birth:  1945/05/27      BSA:          2.120 m Patient Age:    30 years      BP:           105/61 mmHg Patient Gender: M             HR:           95 bpm. Exam Location:  [place] Procedure: 2D Echo, 3D Echo, Cardiac Doppler and Color Doppler Indications:    I50.22 CHF  History:        Patient has prior history of Echocardiogram examinations.                 Cardiomyopathy and CHF, NSTEMi, Prior CABG,                 Signs/Symptoms:Shortness of Breath, Syncope and Chest Pain; Risk                 Factors:Hypertension. Chemotherapy.  Sonographer:    Marygrace Drought RCS Referring Phys: [id number] [place]  1. Left ventricular ejection fraction, by estimation, is 20 to 25%. The left ventricle has severely decreased function. There is akinesis of the inferior and inferoseptal walls with severe hypokinesis in the remaining myocardial wall segments. The left ventricular internal cavity size was mildly dilated. Left ventricular diastolic parameters are consistent with  Grade III diastolic dysfunction (restrictive). Elevated left atrial pressure. The average left ventricular global longitudinal strain is -9.2 %. The global longitudinal strain is abnormal.  2. Right ventricular systolic function is normal. The right ventricular size  is normal. There is normal pulmonary artery systolic pressure.  3. Left atrial size was severely dilated.  4. Right atrial size was mildly dilated.  5. The mitral valve is normal in structure. Moderate centrally directed mitral valve regurgitation. No evidence of mitral stenosis.  6. Tricuspid valve regurgitation is mild to moderate.  7. The aortic valve is normal in structure. Aortic valve regurgitation is mild. No aortic stenosis is present.  8. There is mild dilatation of the ascending aorta, measuring 39 mm.  9. The inferior vena cava is normal in size with greater than 50% respiratory variability, suggesting right atrial pressure of 3 mmHg. Comparison(s): A prior study was performed on 05/12/2020. No significant change from prior study. FINDINGS  Left Ventricle: Left ventricular ejection fraction, by estimation, is 20 to 25%. The left ventricle has severely decreased function. The left ventricle demonstrates global hypokinesis. The average left ventricular global longitudinal strain is -9.2 %. The global longitudinal strain is abnormal. The left ventricular internal cavity size was mildly dilated. There is no left ventricular hypertrophy. Left ventricular diastolic parameters are consistent with Grade III diastolic dysfunction (restrictive). Elevated left atrial pressure. Right Ventricle: The right ventricular size is normal. No increase in right ventricular wall thickness. Right ventricular systolic function is normal. There is normal pulmonary artery systolic pressure. The tricuspid regurgitant velocity is 2.60 m/s, and  with an assumed right atrial pressure of 3 mmHg, the estimated right ventricular systolic pressure is 32.9 mmHg. Left Atrium:  Left atrial size was severely dilated. Right Atrium: Right atrial size was mildly dilated. Pericardium: There is no evidence of pericardial effusion. Mitral Valve: The mitral valve is normal in structure. Moderate mitral valve regurgitation. No evidence of mitral valve stenosis. Tricuspid Valve: The tricuspid valve is normal in structure. Tricuspid valve regurgitation is mild to moderate. No evidence of tricuspid stenosis. Aortic Valve: The aortic valve is normal in structure. Aortic valve regurgitation is mild. Aortic regurgitation PHT measures 217 msec. No aortic stenosis is present. Pulmonic Valve: The pulmonic valve was not well visualized. Pulmonic valve regurgitation is trivial. No evidence of pulmonic stenosis. Aorta: Aortic dilatation noted. There is mild dilatation of the ascending aorta, measuring 39 mm. Venous: The inferior vena cava is normal in size with greater than 50% respiratory variability, suggesting right atrial pressure of 3 mmHg. IAS/Shunts: No atrial level shunt detected by color flow Doppler. Additional Comments: A device lead is visualized.  LEFT VENTRICLE PLAX 2D LVIDd:         6.20 cm  Diastology LVIDs:         5.70 cm  LV e' medial:    6.42 cm/s LV PW:         1.00 cm  LV E/e' medial:  18.3 LV IVS:        0.90 cm  LV e' lateral:   11.20 cm/s LVOT diam:     2.20 cm  LV E/e' lateral: 10.5 LVOT Area:     3.80 cm                         2D Longitudinal Strain                         2D Strain GLS Avg:     -9.2 %                          3D Volume EF:  3D EF:        24 %                         LV EDV:       258 ml                         LV ESV:       195 ml                         LV SV:        63 ml RIGHT VENTRICLE RV Basal diam:  4.80 cm RV Mid diam:    3.40 cm RV S prime:     4.90 cm/s TAPSE (M-mode): 2.0 cm RVSP:           30.0 mmHg LEFT ATRIUM              Index       RIGHT ATRIUM           Index LA diam:        5.80 cm  2.74 cm/m  RA Pressure: 3.00 mmHg LA Vol  (A2C):   125.0 ml 58.95 ml/m RA Area:     22.40 cm LA Vol (A4C):   108.0 ml 50.94 ml/m RA Volume:   71.00 ml  33.49 ml/m LA Biplane Vol: 116.0 ml 54.71 ml/m  AORTIC VALVE AI PHT:      217 msec  AORTA Ao Root diam: 3.40 cm Ao Asc diam:  3.90 cm MITRAL VALVE                 TRICUSPID VALVE MV Area (PHT):               TR Peak grad:   27.0 mmHg MV Decel Time:               TR Vmax:        260.00 cm/s MR Peak grad:    44.1 mmHg   Estimated RAP:  3.00 mmHg MR Mean grad:    29.0 mmHg   RVSP:           30.0 mmHg MR Vmax:         332.00 cm/s MR Vmean:        256.0 cm/s  SHUNTS MR PISA:         2.79 cm    Systemic Diam: 2.20 cm MR PISA Eff ROA: 30 mm MR PISA Radius:  0.67 cm MV E velocity: 117.50 cm/s Kardie Tobb DO Electronically signed by Berniece Salines DO Signature Date/Time: 10/27/2020/4:58:34 PM    Final     ASSESSMENT & PLAN:   75 year old wonderful gentleman with history of hypertension, borderline diabetes, dyslipidemia, GERD with recent STEMI on 03/21/2020 and status post four-vessel CABG on 03/25/2020.  1) Left-sided primary testicular large B-cell lymphoma. Staging to be determined. BCL-2, BCL 6, c-Myc negative.  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 -Patient with no prohibitive toxicities from continuing last and final cycle of R CEOP chemotherapy with G-CSF therapy. -We will need to  reschedule third dose of intrathecal methotrexate in 3 weeks on 11/16/2020. -P.o. potassium 40 mEq of KCl given -Patient is to follow-up with his primary doctor and cardiologist regarding potassium replacement with his ongoing diuretic therapy. -Holding  his p.o. steroids to avoid fluid overload.  FOLLOW UP: US venous b/l lower extremities today/tomorrow for leg swelling Plz adjust schedule to move IT MTX from 10/6 out 3 weeks to 11/16/2020 RTC with Roy Irene Limbo with labs in 2 weeks and appointment for 1 unit of PRBC    All of the patients questions were answered with apparent satisfaction. The patient knows to call the clinic with any problems, questions or concerns.  . The total time spent in the appointment was 30 minutes and more than 50% was on counseling and direct patient cares.   Sullivan Lone MD [place] AAHIVMS [facility] Hematology/Oncology Physician [facility]

## 2020-11-03 ENCOUNTER — Other Ambulatory Visit: Payer: Self-pay

## 2020-11-03 ENCOUNTER — Encounter: Payer: Self-pay | Admitting: Cardiovascular Disease

## 2020-11-03 ENCOUNTER — Ambulatory Visit (INDEPENDENT_AMBULATORY_CARE_PROVIDER_SITE_OTHER): Payer: Medicare HMO | Admitting: Cardiovascular Disease

## 2020-11-03 VITALS — BP 88/55 | HR 100 | Ht 73.0 in | Wt 211.6 lb

## 2020-11-03 DIAGNOSIS — E782 Mixed hyperlipidemia: Secondary | ICD-10-CM

## 2020-11-03 DIAGNOSIS — I5022 Chronic systolic (congestive) heart failure: Secondary | ICD-10-CM

## 2020-11-03 DIAGNOSIS — I10 Essential (primary) hypertension: Secondary | ICD-10-CM | POA: Diagnosis not present

## 2020-11-03 DIAGNOSIS — I25810 Atherosclerosis of coronary artery bypass graft(s) without angina pectoris: Secondary | ICD-10-CM

## 2020-11-03 MED ORDER — POTASSIUM CHLORIDE CRYS ER 20 MEQ PO TBCR: 20.0000 meq | EXTENDED_RELEASE_TABLET | Freq: Every day | ORAL | 3 refills | Status: DC

## 2020-11-03 MED ORDER — DIGOXIN 125 MCG PO TABS: 0.1250 mg | ORAL_TABLET | Freq: Every day | ORAL | 3 refills | Status: DC

## 2020-11-03 MED ORDER — TORSEMIDE 20 MG PO TABS: 40.0000 mg | ORAL_TABLET | Freq: Two times a day (BID) | ORAL | 3 refills | Status: DC

## 2020-11-03 NOTE — Patient Instructions (Addendum)
Medication Instructions:  STOP Lasix START Torsemide 40 mg daily (take 2 tablets of 20 mg) START Potassium 20 meq daily  START Digoxin 0.125 mg daily   *If you need a refill on your cardiac medications before your next appointment, please call your pharmacy*   Lab Work: BMET (complete at [facility])  If you have labs (blood work) drawn today and your tests are completely normal, you will receive your results only by: [employer] (if you have MyChart) OR A paper copy in the mail If you have any lab test that is abnormal or we need to change your treatment, we will call you to review the results.   Follow-Up: At [facility], you and your health needs are our priority.  As part of our continuing mission to provide you with exceptional heart care, we have created designated Provider Care Teams.  These Care Teams include your primary Cardiologist (physician) and Advanced Practice Providers (APPs -  Physician Assistants and Nurse Practitioners) who all work together to provide you with the care you need, when you need it.  We recommend signing up for the patient portal called "MyChart".  Sign up information is provided on this After Visit Summary.  MyChart is used to connect with patients for Virtual Visits (Telemedicine).  Patients are able to view lab/test results, encounter notes, upcoming appointments, etc.  Non-urgent messages can be sent to your provider as well.   To learn more about what you can do with MyChart, go to [email or web address].    Your next appointment:   3 month(s)  The format for your next appointment:   In Person  Provider:   [name], MD   Other Instructions Referral to [facility]- They will contact you to set up an appointment.

## 2020-11-09 ENCOUNTER — Inpatient Hospital Stay: Payer: Medicare HMO

## 2020-11-09 ENCOUNTER — Other Ambulatory Visit: Payer: Medicare HMO

## 2020-11-09 ENCOUNTER — Ambulatory Visit: Payer: Medicare HMO | Admitting: Hematology

## 2020-11-09 ENCOUNTER — Other Ambulatory Visit: Payer: Self-pay

## 2020-11-09 ENCOUNTER — Ambulatory Visit: Payer: Medicare HMO

## 2020-11-09 ENCOUNTER — Inpatient Hospital Stay (HOSPITAL_BASED_OUTPATIENT_CLINIC_OR_DEPARTMENT_OTHER): Payer: Medicare HMO | Admitting: Hematology

## 2020-11-09 VITALS — BP 90/58 | HR 92 | Temp 98.3°F | Resp 18 | Wt 181.2 lb

## 2020-11-09 DIAGNOSIS — C8330 Diffuse large B-cell lymphoma, unspecified site: Secondary | ICD-10-CM | POA: Diagnosis not present

## 2020-11-09 DIAGNOSIS — C8335 Diffuse large B-cell lymphoma, lymph nodes of inguinal region and lower limb: Secondary | ICD-10-CM | POA: Diagnosis not present

## 2020-11-09 DIAGNOSIS — Z5111 Encounter for antineoplastic chemotherapy: Secondary | ICD-10-CM | POA: Diagnosis not present

## 2020-11-09 DIAGNOSIS — Z95828 Presence of other vascular implants and grafts: Secondary | ICD-10-CM

## 2020-11-09 DIAGNOSIS — Z5189 Encounter for other specified aftercare: Secondary | ICD-10-CM | POA: Diagnosis not present

## 2020-11-09 DIAGNOSIS — Z5112 Encounter for antineoplastic immunotherapy: Secondary | ICD-10-CM | POA: Diagnosis not present

## 2020-11-09 DIAGNOSIS — Z79899 Other long term (current) drug therapy: Secondary | ICD-10-CM | POA: Diagnosis not present

## 2020-11-09 LAB — CBC WITH DIFFERENTIAL (CANCER CENTER ONLY)
Abs Immature Granulocytes: 0 10*3/uL (ref 0.00–0.07)
Band Neutrophils: 11 %
Basophils Absolute: 0.1 10*3/uL (ref 0.0–0.1)
Basophils Relative: 1 %
Eosinophils Absolute: 0.1 10*3/uL (ref 0.0–0.5)
Eosinophils Relative: 1 %
HCT: 28.8 % — ABNORMAL LOW (ref 39.0–52.0)
Hemoglobin: 9.4 g/dL — ABNORMAL LOW (ref 13.0–17.0)
Lymphocytes Relative: 2 %
Lymphs Abs: 0.3 10*3/uL — ABNORMAL LOW (ref 0.7–4.0)
MCH: 28.2 pg (ref 26.0–34.0)
MCHC: 32.6 g/dL (ref 30.0–36.0)
MCV: 86.5 fL (ref 80.0–100.0)
Monocytes Absolute: 0.7 10*3/uL (ref 0.1–1.0)
Monocytes Relative: 5 %
Neutro Abs: 13.1 10*3/uL — ABNORMAL HIGH (ref 1.7–7.7)
Neutrophils Relative %: 80 %
Platelet Count: 136 10*3/uL — ABNORMAL LOW (ref 150–400)
RBC: 3.33 MIL/uL — ABNORMAL LOW (ref 4.22–5.81)
RDW: 21 % — ABNORMAL HIGH (ref 11.5–15.5)
WBC Count: 14.4 10*3/uL — ABNORMAL HIGH (ref 4.0–10.5)
nRBC: 0 % (ref 0.0–0.2)

## 2020-11-09 LAB — CMP (CANCER CENTER ONLY)
ALT: 34 U/L (ref 0–44)
AST: 20 U/L (ref 15–41)
Albumin: 3.5 g/dL (ref 3.5–5.0)
Alkaline Phosphatase: 123 U/L (ref 38–126)
Anion gap: 18 — ABNORMAL HIGH (ref 5–15)
BUN: 20 mg/dL (ref 8–23)
CO2: 29 mmol/L (ref 22–32)
Calcium: 7.6 mg/dL — ABNORMAL LOW (ref 8.9–10.3)
Chloride: 98 mmol/L (ref 98–111)
Creatinine: 1.67 mg/dL — ABNORMAL HIGH (ref 0.61–1.24)
GFR, Estimated: 42 mL/min — ABNORMAL LOW (ref >60)
Glucose, Bld: 130 mg/dL — ABNORMAL HIGH (ref 70–99)
Potassium: 3.5 mmol/L (ref 3.5–5.1)
Sodium: 145 mmol/L (ref 135–145)
Total Bilirubin: 1.4 mg/dL — ABNORMAL HIGH (ref 0.3–1.2)
Total Protein: 6.3 g/dL — ABNORMAL LOW (ref 6.5–8.1)

## 2020-11-09 LAB — SAMPLE TO BLOOD BANK

## 2020-11-09 MED ORDER — SODIUM CHLORIDE 0.9% FLUSH
10.0000 mL | Freq: Once | INTRAVENOUS | Status: AC
  Administered 2020-11-09: 10 mL

## 2020-11-09 NOTE — Progress Notes (Signed)
Labs from today faxed to [facility]/ Dr [name] per his request.

## 2020-11-10 ENCOUNTER — Telehealth: Payer: Self-pay | Admitting: Cardiovascular Disease

## 2020-11-10 ENCOUNTER — Other Ambulatory Visit: Payer: Self-pay

## 2020-11-10 DIAGNOSIS — I5022 Chronic systolic (congestive) heart failure: Secondary | ICD-10-CM

## 2020-11-10 MED ORDER — TORSEMIDE 20 MG PO TABS: 40.0000 mg | ORAL_TABLET | Freq: Every day | ORAL | 3 refills | Status: DC

## 2020-11-10 NOTE — Telephone Encounter (Signed)
I called patient- advised that AVS, and note states to take 40 mg daily patient was taking it 40 mg twice daily.  Patient advised to only take 40 mg daily- blood work yesterday in [place] you want to repeat BMET in a week to recheck?  Thanks!

## 2020-11-10 NOTE — Telephone Encounter (Signed)
Pt c/o medication issue:  1. Name of Medication: torsemide (DEMADEX) 20 MG tablet  2. How are you currently taking this medication (dosage and times per day)? Patient taking 40 mg twice daily   3. Are you having a reaction (difficulty breathing--STAT)?   4. What is your medication issue? Patient is concerned about how often he is urinating and the amount of weight he has lost. The patient is urinating every 1.5 hours and has lost a lot of weight.  Patient reported his weight as follows: 11/05/20: 195 11/10/20: 178   Patient is not sure what to do. Please advise

## 2020-11-11 NOTE — Telephone Encounter (Signed)
I called patient- advised of message below. Patient is aware.   Thanks!

## 2020-11-15 ENCOUNTER — Encounter: Payer: Self-pay | Admitting: Hematology

## 2020-11-15 NOTE — Progress Notes (Signed)
Roy [name]    HEMATOLOGY/ONCOLOGY CLINIC NOTE  Date of Service: .11/09/2020   Patient Care Team: [name], DO as PCP - General (Family Medicine) O'Neal, [name], MD as PCP - Cardiology (Cardiology)  CHIEF COMPLAINTS/PURPOSE OF CONSULTATION:  Primary testicular large B cell lymphoma  HISTORY OF PRESENTING ILLNESS:   Roy [name] is a wonderful 75 y.o. male who has been referred to [place] by Dr Maudie Mercury for evaluation and management of primary testicular large B-cell lymphoma  Patient has a history of hypertension, borderline diabetes, previous nonischemic cardiomyopathy but with a recent MI,, CHF who presented with a left testicular swelling end of sept/ early oct 2021.  He notes that he was seen by Urology - Dr Minette Brine and had an ultrasound--possible infection was suspected and he received 2 rounds of abx without significant improvement.  He notes he received 2 subsequent follow-up ultrasounds in December in January and finally had left transinguinal orchiectomy on 03/11/2020. He notes he had persistent testicular swelling post operatively which required drainage.  He also notes having issues with urinary retention and had a urinary catheter for 2 weeks, abx+  Pathology showed primary testicular large B-cell lymphoma.  No other additional imaging studies for staging have been done at this time.  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  Patient was admitted to the hospital on 03/21/2020 with acute onset dyspnea epigastric pain and cough with pink frothy sputum.  He was noted to have hypoxia with oxygen saturation of 82% on arrival.  He was noted to have non-ST elevation MI on 2/26.  CTA chest ruled out PE.  His symptoms from fluid with aggressive diuresis.  He was noted to have severe triple-vessel disease on cath with an ejection fraction of 25 to 35% with no significant valvular lesions.  He subsequently had CORONARY ARTERY BYPASS  GRAFTING (CABG), ON PUMP, TIMES FOUR, USING BILATERAL INTERNAL MAMMARY ARTERIES AND LEFT RADIAL ARTERY .. On March 25, 2020.  Patient notes he is gradually recovering from his cardiac surgery notes being fatigued having gone through multiple urologic issues, skin surgery for squamous of carcinoma of the nose, acute MI CABG x4.  Patient notes no fevers no chills no night sweats. He notes he might have lost some weight but difficult to quantify in the setting of fluid overload and diuresis. Notes he is starting to eat a little better. Minimal left scrotal swelling. No other acute new focal symptoms.  INTERVAL HISTORY   Roy [name] is a wonderful 75 y.o. male who is here today for evaluation and management of Primary testicular large B cell lymphoma and s/p cycle 6 of R-CEOP chemotherapy Patient treated with Vantin for Klebsiella UTI.  He notes his symptoms of UTI have resolved and he is feeling much better. No significant toxicities from his 6 cycle of chemotherapy. He is scheduled for his third cycle of prophylactic intrathecal methotrexate on 11/16/2020.  We discussed and decided to hold off on the fourth cycle of intrathecal methotrexate.  Will get PET scan in 3 weeks to evaluate posttreatment status of his lymphoma.  He notes leg swelling and being fluid up and is being diuresed by cardiology..  No other primary toxicities from his chemotherapy at this time.     MEDICAL HISTORY:  Past Medical History:  Diagnosis Date   Allergic rhinitis    Arthritis    wrists   Cardiomyopathy, nonischemic [facility])    followed by cardiology--- dr [name]---  2016 ef 45-50% ,  2016 nuclear ef 37%,  2017 per echo ef 50-55%   CHF (congestive heart failure), NYHA class III ([place]) 03/21/2020   Coronary artery disease    GERD (gastroesophageal reflux disease)    Hiatal hernia    History of kidney stones    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;   01/ 2022 moh's surgery  w/ skin graft of nose   History of syncope (03-06-2020 pt stated has not had sycopal episode in few years, stated it seems to happen in extreme hot conditions)   cardiologist--- dr [name]--- dx recurrent syncope;  nuclear study 11-03-2014 intermediate risk w/ no ischemia, apical hypokinesis, nuclear ef 37%;  event monitor-- 12-21-2015 SB/ ST  no pauses/ arrythmia's;  echo 08-03-2015 ef 50-55%   Hyperlipidemia    Hypertension    followed by pcp   Hypovitaminosis D    Mass of left testicle    Nocturia    Plantar fasciitis    Presence of surgical incision    01/ 2022  moh's w/ skin graft of nose, per pt still healing and wear bandage daily   Type 2 diabetes mellitus ([place])    pt is adament that he is not and have been told he is a diabetic but a borderline;  followed by pcp, in pcp note states DM2 and takes 2 meds daily   Wears glasses    Wears hearing aid in both ears     SURGICAL HISTORY: Past Surgical History:  Procedure Laterality Date   COLONOSCOPY  last one 01-30-2017   CORONARY ARTERY BYPASS GRAFT N/A 03/25/2020   Procedure: CORONARY ARTERY BYPASS GRAFTING (CABG), ON PUMP, TIMES FOUR, USING BILATERAL INTERNAL MAMMARY ARTERIES AND LEFT RADIAL ARTERY;  Surgeon: [name], MD;  Location: [place];  Service: Open Heart Surgery;  Laterality: N/A;   IR IMAGING GUIDED PORT INSERTION  07/06/2020   LOW ANTERIOR RESECTION RECTUM W/ COLOPROCTOSTOMY  05/2003   MOHS SURGERY  01/2020   nose w/ graft   ORCHIECTOMY Left 03/11/2020   Procedure: [place];  Surgeon: [name], MD;  Location: [facility];  Service: Urology;  Laterality: Left;  ONLY NEEDS 60 MIN   RADIAL ARTERY HARVEST Left 03/25/2020   Procedure: LEFT RADIAL ARTERY HARVEST;  Surgeon: [name], MD;  Location: [place];  Service: Open Heart Surgery;  Laterality: Left;   RIGHT/LEFT HEART CATH AND CORONARY ANGIOGRAPHY N/A 03/23/2020   Procedure: RIGHT/LEFT HEART CATH AND CORONARY  ANGIOGRAPHY;  Surgeon: [name], MD;  Location: [place] [facility];  Service: Cardiovascular;  Laterality: N/A;   SHOULDER SURGERY Right 1992; 07/ 2021   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 Left 12/24/2008   left ear and nose    TEE WITHOUT CARDIOVERSION N/A 03/25/2020   Procedure: TRANSESOPHAGEAL ECHOCARDIOGRAM (TEE);  Surgeon: [name], MD;  Location: [place];  Service: Open Heart Surgery;  Laterality: N/A;   TONSILLECTOMY AND ADENOIDECTOMY  child   UPPER GASTROINTESTINAL ENDOSCOPY  last one 06-06-2017    SOCIAL HISTORY: Social History   Socioeconomic History   Marital status: Married    Spouse name: Not on file   Number of children: Not on file   Years of education: Not on file   Highest education level: Not on file  Occupational History   Not on file  Tobacco Use   Smoking status: Former    Types: Pipe  Quit date: 11/29/1976    Years since quitting: 43.9   Smokeless tobacco: Never  Vaping Use   Vaping Use: Never used  Substance and Sexual Activity   Alcohol use: Yes    Alcohol/week: 0.0 standard drinks    Comment: Occasional drink    Drug use: Never   Sexual activity: Not on file  Other Topics Concern   Not on file  Social History Narrative   Not on file   Social Determinants of Health   Financial Resource Strain: Not on file  Food Insecurity: Not on file  Transportation Needs: Not on file  Physical Activity: Not on file  Stress: Not on file  Social Connections: Not on file  Intimate Partner Violence: Not on file    FAMILY HISTORY: Family History  Problem Relation Age of Onset   Heart attack Mother    Stroke Father    Colon cancer Neg Hx    Esophageal cancer Neg Hx    Pancreatic cancer Neg Hx    Rectal cancer Neg Hx    Stomach cancer Neg Hx    Colon polyps Neg Hx     ALLERGIES:  has No Known Allergies.  MEDICATIONS:  Current Outpatient Medications  Medication Sig Dispense Refill   acetaminophen (TYLENOL) 325 MG  tablet Take 2 tablets (650 mg total) by mouth every 4 (four) hours as needed for headache or mild pain.     Apoaequorin (PREVAGEN PO) Take 1 tablet by mouth at bedtime.     aspirin EC 81 MG tablet Take 81 mg by mouth daily. Swallow whole.     atorvastatin (LIPITOR) 40 MG tablet Take 1 tablet (40 mg total) by mouth daily. 90 tablet 3   cholecalciferol (VITAMIN D) 1000 UNITS tablet Take 2,000 Units by mouth See admin instructions. Mon-friday     digoxin (LANOXIN) 0.125 MG tablet Take 1 tablet (0.125 mg total) by mouth daily. 90 tablet 3   empagliflozin (JARDIANCE) 10 MG TABS tablet Take 1 tablet (10 mg total) by mouth daily before breakfast. 90 tablet 1   ENTRESTO 24-26 MG TAKE 1 TABLET BY MOUTH 2 TIMES DAILY. 60 tablet 3   glucosamine-chondroitin 500-400 MG tablet Take 1 tablet by mouth. 5 times weekly     LORazepam (ATIVAN) 0.5 MG tablet Take 1 tablet (0.5 mg total) by mouth every 6 (six) hours as needed for anxiety. Put 1 pill under your tongue every 6 hrs, if needed, for nausea 30 tablet 0   metFORMIN (GLUCOPHAGE) 1000 MG tablet Take 1,000 mg by mouth 2 (two) times daily with a meal.      metoprolol (TOPROL XL) 200 MG 24 hr tablet Take 1 tablet (200 mg total) by mouth daily. 90 tablet 3   Multiple Vitamin (MULTIVITAMIN WITH MINERALS) TABS tablet Take 1 tablet by mouth. 5 times weekly     omeprazole (PRILOSEC) 20 MG capsule Take 20 mg by mouth daily.     ondansetron (ZOFRAN) 8 MG tablet Take 1 tablet (8 mg total) by mouth every 8 (eight) hours as needed for nausea or vomiting. Take 1 pill every 8 hrs for 3 days. 30 tablet 3   potassium chloride SA (KLOR-CON M20) 20 MEQ tablet Take 1 tablet (20 mEq total) by mouth daily. 90 tablet 3   prochlorperazine (COMPAZINE) 10 MG tablet Take 1 tablet (10 mg total) by mouth every 8 (eight) hours as needed for nausea or vomiting. 40 tablet 2   spironolactone (ALDACTONE) 25 MG tablet Take 0.5 tablets (12.5  mg total) by mouth daily. 60 tablet 1   tamsulosin  (FLOMAX) 0.4 MG CAPS capsule Take 0.4 mg by mouth at bedtime.     torsemide (DEMADEX) 20 MG tablet Take 2 tablets (40 mg total) by mouth daily. 180 tablet 3   vitamin B-12 (CYANOCOBALAMIN) 1000 MCG tablet Take 1,000 mcg by mouth. 5 times a week     Current Facility-Administered Medications  Medication Dose Route Frequency Provider Last Rate Last Admin   potassium chloride (KLOR-CON) CR tablet 20 mEq  20 mEq Oral BID [name], MD        REVIEW OF SYSTEMS:   .10 Point review of Systems was done is negative except as noted above.  PHYSICAL EXAMINATION: ECOG PERFORMANCE STATUS: 2 - Symptomatic, <50% confined to bed  .BP (!) 90/58   Pulse 92   Temp 98.3 F (36.8 C)   Resp 18   Wt 181 lb 3.2 oz (82.2 kg)   SpO2 99%   BMI 23.91 kg/m  . GENERAL:alert, in no acute distress and comfortable SKIN: no acute rashes, no significant lesions EYES: conjunctiva are pink and non-injected, sclera anicteric OROPHARYNX: MMM, no exudates, no oropharyngeal erythema or ulceration NECK: supple, no JVD LYMPH:  no palpable lymphadenopathy in the cervical, axillary or inguinal regions LUNGS: clear to auscultation b/l with normal respiratory effort HEART: regular rate & rhythm ABDOMEN:  normoactive bowel sounds , non tender, not distended. Extremity: no pedal edema PSYCH: alert & oriented x 3 with fluent speech NEURO: no focal motor/sensory deficits     LABORATORY DATA:  I have reviewed the data as listed   CBC Latest Ref Rng & Units 11/09/2020 10/26/2020 10/16/2020  WBC 4.0 - 10.5 K/uL 14.4(H) 30.0(H) 15.5(H)  Hemoglobin 13.0 - 17.0 g/dL 9.4(L) 9.0(L) 8.4(L)  Hematocrit 39.0 - 52.0 % 28.8(L) 28.8(L) 26.6(L)  Platelets 150 - 400 K/uL 136(L) 204 195    . CMP Latest Ref Rng & Units 11/09/2020 10/26/2020 10/16/2020  Glucose 70 - 99 mg/dL 130(H) 225(H) 198(H)  BUN 8 - 23 mg/dL _0 Creatinine 0.61 - 1.24 mg/dL 1.67(H) 1.26(H) 1.22  Sodium 135 - 145 mmol/L 145 144 143  Potassium 3.5 -  5.1 mmol/L 3.5 3.2(L) 3.0(L)  Chloride 98 - 111 mmol/L 98 104 105  CO2 22 - 32 mmol/L _1 Calcium 8.9 - 10.3 mg/dL 7.6(L) 7.0(L) 7.9(L)  Total Protein 6.5 - 8.1 g/dL 6.3(L) 5.8(L) 6.0(L)  Total Bilirubin 0.3 - 1.2 mg/dL 1.4(H) 0.9 1.0  Alkaline Phos 38 - 126 U/L 123 106 113  AST 15 - 41 U/L _2 ALT 0 - 44 U/L 34 18 24   . Lab Results  Component Value Date   LDH 199 (H) 07/23/2020   SURGICAL PATHOLOGY   THIS IS AN ADDENDUM REPORT   CASE: [id number]  PATIENT: Elieser Shubert  Surgical Pathology Report  Addendum    Reason for Addendum #1:  Molecular Genetic Test Results, FISH   Clinical History: Left testicular mass (crm)      FINAL MICROSCOPIC DIAGNOSIS:   A. TESTICLE, LEFT, ORCHIECTOMY:  -  Diffuse aggressive large B-cell lymphoma  -  See comment    COMMENT:   Sections of testicle show architectural effacement by sheets of large  lymphoid cells with vesicular nuclei and pale cytoplasm.  There is  admixed apoptotic debris and increased mitotic activity.  By  immunohistochemistry, the large lymphoid cells are positive for CD20,  CD5 (dim), BCL6 (dim), MUM1,  and BCL2.  They are negative for CD10, CD30  (<1%), cyclin D1, and TdT.  The Ki67 proliferation index is up to  approximately 70%.  CD3 highlights small T-cells in the background. EBV  is negative by in situ hybridization.   Together, the findings support the diagnosis of a diffuse aggressive  large B-cell lymphoma. The differential diagnosis includes a diffuse  large B-cell lymphoma, NOS, with activated B-cell subtype by the [facility]  algorithm and high-grade B-cell lymphoma with MYC and BCL2 or BCL6  rearrangements.  FISH for BCL2, BCL6, and MYC rearrangements will be  performed. Correlation with clinical and radiographic findings is  recommended for consideration of a primary testicular lymphoma.   Result reported to L. Gibson on 03/16/20 at 1720 by S. O'Neill.    ADDENDUM:   FISH RESULTS:    Results: NORMAL   Interpretation:   BCL6 rearrangement:      Not Detected  MYC rearrangement:  Not Detected  MYC amplification:  Not Detected  BCL6 rearrangement:      Not Detected   Please note this testing was performed and interpreted by an outside  facility ([facility]).  This addendum is only being added to provide a  summary of the results for report completeness.  Please see electronic  medical record for a copy of the full report.   RADIOGRAPHIC STUDIES: I have personally reviewed the radiological images as listed and agreed with the findings in the report. ECHOCARDIOGRAM COMPLETE  Result Date: 10/27/2020    ECHOCARDIOGRAM REPORT   Patient Name:   SHAWAN CORELLA Date of Exam: 10/27/2020 Medical Rec #:  [id number]     Height:       72.0 in Accession #:    [id number]    Weight:       197.7 lb Date of Birth:  1945-08-10      BSA:          2.120 m Patient Age:    41 years      BP:           105/61 mmHg Patient Gender: M             HR:           95 bpm. Exam Location:  [place] Procedure: 2D Echo, 3D Echo, Cardiac Doppler and Color Doppler Indications:    I50.22 CHF  History:        Patient has prior history of Echocardiogram examinations.                 Cardiomyopathy and CHF, NSTEMi, Prior CABG,                 Signs/Symptoms:Shortness of Breath, Syncope and Chest Pain; Risk                 Factors:Hypertension. Chemotherapy.  Sonographer:    Marygrace Drought RCS Referring Phys: [id number] [place]  1. Left ventricular ejection fraction, by estimation, is 20 to 25%. The left ventricle has severely decreased function. There is akinesis of the inferior and inferoseptal walls with severe hypokinesis in the remaining myocardial wall segments. The left ventricular internal cavity size was mildly dilated. Left ventricular diastolic parameters are consistent with Grade III diastolic dysfunction (restrictive). Elevated left atrial pressure. The average left ventricular  global longitudinal strain is -9.2 %. The global longitudinal strain is abnormal.  2. Right ventricular systolic function is normal. The right ventricular size is normal. There is normal pulmonary artery systolic  pressure.  3. Left atrial size was severely dilated.  4. Right atrial size was mildly dilated.  5. The mitral valve is normal in structure. Moderate centrally directed mitral valve regurgitation. No evidence of mitral stenosis.  6. Tricuspid valve regurgitation is mild to moderate.  7. The aortic valve is normal in structure. Aortic valve regurgitation is mild. No aortic stenosis is present.  8. There is mild dilatation of the ascending aorta, measuring 39 mm.  9. The inferior vena cava is normal in size with greater than 50% respiratory variability, suggesting right atrial pressure of 3 mmHg. Comparison(s): A prior study was performed on 05/12/2020. No significant change from prior study. FINDINGS  Left Ventricle: Left ventricular ejection fraction, by estimation, is 20 to 25%. The left ventricle has severely decreased function. The left ventricle demonstrates global hypokinesis. The average left ventricular global longitudinal strain is -9.2 %. The global longitudinal strain is abnormal. The left ventricular internal cavity size was mildly dilated. There is no left ventricular hypertrophy. Left ventricular diastolic parameters are consistent with Grade III diastolic dysfunction (restrictive). Elevated left atrial pressure. Right Ventricle: The right ventricular size is normal. No increase in right ventricular wall thickness. Right ventricular systolic function is normal. There is normal pulmonary artery systolic pressure. The tricuspid regurgitant velocity is 2.60 m/s, and  with an assumed right atrial pressure of 3 mmHg, the estimated right ventricular systolic pressure is 97.4 mmHg. Left Atrium: Left atrial size was severely dilated. Right Atrium: Right atrial size was mildly dilated. Pericardium: There  is no evidence of pericardial effusion. Mitral Valve: The mitral valve is normal in structure. Moderate mitral valve regurgitation. No evidence of mitral valve stenosis. Tricuspid Valve: The tricuspid valve is normal in structure. Tricuspid valve regurgitation is mild to moderate. No evidence of tricuspid stenosis. Aortic Valve: The aortic valve is normal in structure. Aortic valve regurgitation is mild. Aortic regurgitation PHT measures 217 msec. No aortic stenosis is present. Pulmonic Valve: The pulmonic valve was not well visualized. Pulmonic valve regurgitation is trivial. No evidence of pulmonic stenosis. Aorta: Aortic dilatation noted. There is mild dilatation of the ascending aorta, measuring 39 mm. Venous: The inferior vena cava is normal in size with greater than 50% respiratory variability, suggesting right atrial pressure of 3 mmHg. IAS/Shunts: No atrial level shunt detected by color flow Doppler. Additional Comments: A device lead is visualized.  LEFT VENTRICLE PLAX 2D LVIDd:         6.20 cm  Diastology LVIDs:         5.70 cm  LV e' medial:    6.42 cm/s LV PW:         1.00 cm  LV E/e' medial:  18.3 LV IVS:        0.90 cm  LV e' lateral:   11.20 cm/s LVOT diam:     2.20 cm  LV E/e' lateral: 10.5 LVOT Area:     3.80 cm                         2D Longitudinal Strain                         2D Strain GLS Avg:     -9.2 %                          3D Volume EF:  3D EF:        24 %                         LV EDV:       258 ml                         LV ESV:       195 ml                         LV SV:        63 ml RIGHT VENTRICLE RV Basal diam:  4.80 cm RV Mid diam:    3.40 cm RV S prime:     4.90 cm/s TAPSE (M-mode): 2.0 cm RVSP:           30.0 mmHg LEFT ATRIUM              Index       RIGHT ATRIUM           Index LA diam:        5.80 cm  2.74 cm/m  RA Pressure: 3.00 mmHg LA Vol (A2C):   125.0 ml 58.95 ml/m RA Area:     22.40 cm LA Vol (A4C):   108.0 ml 50.94 ml/m RA Volume:   71.00  ml  33.49 ml/m LA Biplane Vol: 116.0 ml 54.71 ml/m  AORTIC VALVE AI PHT:      217 msec  AORTA Ao Root diam: 3.40 cm Ao Asc diam:  3.90 cm MITRAL VALVE                 TRICUSPID VALVE MV Area (PHT):               TR Peak grad:   27.0 mmHg MV Decel Time:               TR Vmax:        260.00 cm/s MR Peak grad:    44.1 mmHg   Estimated RAP:  3.00 mmHg MR Mean grad:    29.0 mmHg   RVSP:           30.0 mmHg MR Vmax:         332.00 cm/s MR Vmean:        256.0 cm/s  SHUNTS MR PISA:         2.79 cm    Systemic Diam: 2.20 cm MR PISA Eff ROA: 30 mm MR PISA Radius:  0.67 cm MV E velocity: 117.50 cm/s Kardie Tobb DO Electronically signed by Berniece Salines DO Signature Date/Time: 10/27/2020/4:58:34 PM    Final    VAS [place] LOWER EXTREMITY VENOUS (DVT)  Result Date: 11/02/2020  Lower Venous DVT Study Patient Name:  JEN EPPINGER  Date of Exam:   11/02/2020 Medical Rec #: [id number]      Accession #:    [id number] Date of Birth: [birth year]       Patient Gender: M Patient Age:   17 years Exam Location:  [facility] Procedure:      VAS [place] LOWER EXTREMITY VENOUS (DVT) Referring Phys: Sullivan Lone --------------------------------------------------------------------------------  Indications: Pain in BLEs & pitting edema.  Risk Factors: CA patient on chemotherapy & CHF. Comparison Study: No previous exams Performing Technologist: Jody Hill RVT, RDMS  Examination Guidelines: A complete evaluation includes B-mode imaging, spectral Doppler, color Doppler, and power Doppler as needed  of all accessible portions of each vessel. Bilateral testing is considered an integral part of a complete examination. Limited examinations for reoccurring indications may be performed as noted. The reflux portion of the exam is performed with the patient in reverse Trendelenburg.  +---------+---------------+---------+-----------+----------+--------------+ RIGHT    CompressibilityPhasicitySpontaneityPropertiesThrombus Aging  +---------+---------------+---------+-----------+----------+--------------+ CFV      Full           Yes      Yes                                 +---------+---------------+---------+-----------+----------+--------------+ SFJ      Full                                                        +---------+---------------+---------+-----------+----------+--------------+ FV Prox  Full           Yes      Yes                                 +---------+---------------+---------+-----------+----------+--------------+ FV Mid   Full           Yes      Yes                                 +---------+---------------+---------+-----------+----------+--------------+ FV DistalFull           Yes      Yes                                 +---------+---------------+---------+-----------+----------+--------------+ PFV      Full                                                        +---------+---------------+---------+-----------+----------+--------------+ POP      Full           Yes      Yes                                 +---------+---------------+---------+-----------+----------+--------------+ PTV      Full                                                        +---------+---------------+---------+-----------+----------+--------------+ PERO     Full                                                        +---------+---------------+---------+-----------+----------+--------------+   +---------+---------------+---------+-----------+----------+-------------------+ LEFT     CompressibilityPhasicitySpontaneityPropertiesThrombus Aging      +---------+---------------+---------+-----------+----------+-------------------+ CFV  Full           Yes      Yes                                      +---------+---------------+---------+-----------+----------+-------------------+ SFJ      Full                                                              +---------+---------------+---------+-----------+----------+-------------------+ FV Prox  Full           Yes      Yes                                      +---------+---------------+---------+-----------+----------+-------------------+ FV Mid   Full           Yes      Yes                                      +---------+---------------+---------+-----------+----------+-------------------+ FV DistalFull           Yes      Yes                                      +---------+---------------+---------+-----------+----------+-------------------+ PFV      Full                                                             +---------+---------------+---------+-----------+----------+-------------------+ POP      Full           Yes      Yes                                      +---------+---------------+---------+-----------+----------+-------------------+ PTV                                                   not visualized on                                                         this exam           +---------+---------------+---------+-----------+----------+-------------------+ PERO     Full                                                             +---------+---------------+---------+-----------+----------+-------------------+  Left Technical Findings: Not visualized segments include posterior tibial veins.   Summary: BILATERAL: - No evidence of deep vein thrombosis seen in the lower extremities, bilaterally. - No evidence of superficial venous thrombosis in the lower extremities, bilaterally. -No evidence of popliteal cyst, bilaterally. Subcutaneous edema throughout BLEs.  LEFT: - Portions of this examination were limited- see technologist comments above.  *See table(s) above for measurements and observations. Electronically signed by Monica Martinez MD on 11/02/2020 at 4:33:05 PM.    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  1) Left-sided primary testicular large B-cell lymphoma. Staging to be determined. BCL-2, BCL 6, c-Myc negative.  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 -Patient with no prohibitive toxicities from 6thl cycle of R CEOP chemotherapy with G-CSF therapy. -rescheduled third dose of intrathecal methotrexate in 3 weeks on 11/16/2020. -P.o. potassium 40 mEq of KCl given-Holding his p.o. steroids to avoid fluid overload. -Discussed and decided to hold off on the fourth cycle of intrathecal methotrexate - Klebsiella UTI effectively treated by cefpodoxime.  Symptoms resolved. -Ultrasound bilateral lower extremity venous on 11/02/2020 showed no evidence of DVT bilaterally.  FOLLOW UP: Pet/ct in 3 weeks RTC with Dr Irene Limbo with portflush and labs in 4 weeks     All of the patients questions were answered with apparent satisfaction. The patient knows to call the clinic with any problems, questions or concerns.  . The total time spent in the appointment was 30 minutes and more than 50% was on counseling and direct patient cares.   Sullivan Lone MD [place] AAHIVMS [facility] Hematology/Oncology Physician [facility]

## 2020-11-16 ENCOUNTER — Encounter (HOSPITAL_COMMUNITY): Payer: Self-pay

## 2020-11-16 ENCOUNTER — Other Ambulatory Visit: Payer: Self-pay

## 2020-11-16 ENCOUNTER — Ambulatory Visit (HOSPITAL_COMMUNITY)
Admission: RE | Admit: 2020-11-16 | Discharge: 2020-11-16 | Disposition: A | Discharge status: Home / Self Care | Payer: Medicare HMO | Source: Ambulatory Visit | Attending: Hematology | Admitting: Hematology

## 2020-11-16 DIAGNOSIS — I5022 Chronic systolic (congestive) heart failure: Secondary | ICD-10-CM | POA: Diagnosis not present

## 2020-11-16 DIAGNOSIS — R634 Abnormal weight loss: Secondary | ICD-10-CM

## 2020-11-16 HISTORY — DX: Abnormal weight loss: R63.4

## 2020-11-16 NOTE — Progress Notes (Signed)
Pt arrived for pre procedure in short stay and assessment completed, inquired with Diagnostic radiology and discover Pt intrathecal methotrexate appointment was not scheduled with them. Regina in to see Pt and will contact Beth with Dr Irene Limbo to reschedule. Pt discharged home with his spouse.

## 2020-11-17 ENCOUNTER — Encounter: Payer: Self-pay | Admitting: Hematology

## 2020-11-17 LAB — BASIC METABOLIC PANEL
BUN/Creatinine Ratio: 14 (ref 10–24)
BUN: 22 mg/dL (ref 8–27)
CO2: 28 mmol/L (ref 20–29)
Calcium: 7.3 mg/dL — ABNORMAL LOW (ref 8.6–10.2)
Chloride: 98 mmol/L (ref 96–106)
Creatinine, Ser: 1.62 mg/dL — ABNORMAL HIGH (ref 0.76–1.27)
Glucose: 128 mg/dL — ABNORMAL HIGH (ref 70–99)
Potassium: 4.1 mmol/L (ref 3.5–5.2)
Sodium: 145 mmol/L — ABNORMAL HIGH (ref 134–144)
eGFR: 43 mL/min/{1.73_m2} — ABNORMAL LOW (ref >59)

## 2020-11-20 ENCOUNTER — Other Ambulatory Visit: Payer: Self-pay

## 2020-11-20 ENCOUNTER — Ambulatory Visit: Payer: Medicare HMO | Attending: Internal Medicine

## 2020-11-20 ENCOUNTER — Encounter: Payer: Self-pay | Admitting: Hematology

## 2020-11-20 DIAGNOSIS — Z23 Encounter for immunization: Secondary | ICD-10-CM

## 2020-11-20 MED ORDER — PFIZER COVID-19 VAC BIVALENT 30 MCG/0.3ML IM SUSP
INTRAMUSCULAR | 0 refills | Status: DC
  Filled 2020-11-20: qty 0.3, 1d supply, fill #0

## 2020-11-20 NOTE — Progress Notes (Signed)
   Covid-19 Vaccination Clinic  Name:  Roy [name]    MRN: [id number] DOB: Jan 29, 1945  11/20/2020  [name] was observed post Covid-19 immunization for 15 minutes without incident. He was provided with Vaccine Information Sheet and instruction to access the V-Safe system.   [name] was instructed to call 911 with any severe reactions post vaccine: Difficulty breathing  Swelling of face and throat  A fast heartbeat  A bad rash all over body  Dizziness and weakness   Immunizations Administered     Name Date Dose VIS Date Route   Pfizer Covid-19 Vaccine Bivalent Booster 11/20/2020 10:05 AM 0.3 mL 09/23/2020 Intramuscular   Manufacturer: [place]   Lot: [place]: [place], PharmD, MBA Clinical Acute Care Pharmacist

## 2020-11-25 ENCOUNTER — Ambulatory Visit (HOSPITAL_COMMUNITY)
Admission: RE | Admit: 2020-11-25 | Discharge: 2020-11-25 | Disposition: A | Discharge status: Home / Self Care | Payer: Medicare HMO | Source: Ambulatory Visit | Attending: Hematology | Admitting: Hematology

## 2020-11-25 ENCOUNTER — Other Ambulatory Visit: Payer: Self-pay

## 2020-11-25 ENCOUNTER — Encounter (HOSPITAL_COMMUNITY): Payer: Self-pay

## 2020-11-25 ENCOUNTER — Other Ambulatory Visit: Payer: Self-pay | Admitting: Hematology

## 2020-11-25 VITALS — BP 98/53 | HR 93 | Temp 98.2°F | Resp 20 | Ht 73.0 in | Wt 177.0 lb

## 2020-11-25 DIAGNOSIS — C8590 Non-Hodgkin lymphoma, unspecified, unspecified site: Secondary | ICD-10-CM | POA: Diagnosis not present

## 2020-11-25 DIAGNOSIS — C8330 Diffuse large B-cell lymphoma, unspecified site: Secondary | ICD-10-CM | POA: Insufficient documentation

## 2020-11-25 DIAGNOSIS — Z5111 Encounter for antineoplastic chemotherapy: Secondary | ICD-10-CM | POA: Insufficient documentation

## 2020-11-25 DIAGNOSIS — Z7189 Other specified counseling: Secondary | ICD-10-CM | POA: Insufficient documentation

## 2020-11-25 IMAGING — RF DG FLUORO GUIDE SPINAL/SI JT INJ*L*
2 series · 3 of 3 positions shown · non-contrast
Comparison: none

CLINICAL DATA: Lymphoma.

EXAM:
FLUOROSCOPICALLY GUIDED LUMBAR PUNCTURE FOR INTRATHECAL
CHEMOTHERAPY
TECHNIQUE: Informed consent was obtained from the patient prior to the
procedure, including potential complications of headache, allergy,
and pain. A 'time out' was performed. With the patient prone, the
lower back was prepped with Betadine. 1% Lidocaine was used for
local anesthesia. Lumbar puncture was performed at the L3-L4 level
via a left interlaminar approach using a 3.5 inch 20 gauge needle
with return of clear CSF. 12 mg of methotrexate mix with 50 mg
Solu-Cortef was injected into the subarachnoid space slowly. The
patient tolerated the procedure well without apparent complication.
FLUOROSCOPY TIME:  Radiation Exposure Index (as provided by the
fluoroscopic device): 0.5 mGy
Fluoroscopy Time:  12 seconds
Number of Acquired Images:  0

[Series 1: cp_injection proc · 1 of 1 slices shown (1 of 2)]
[im 1/1]
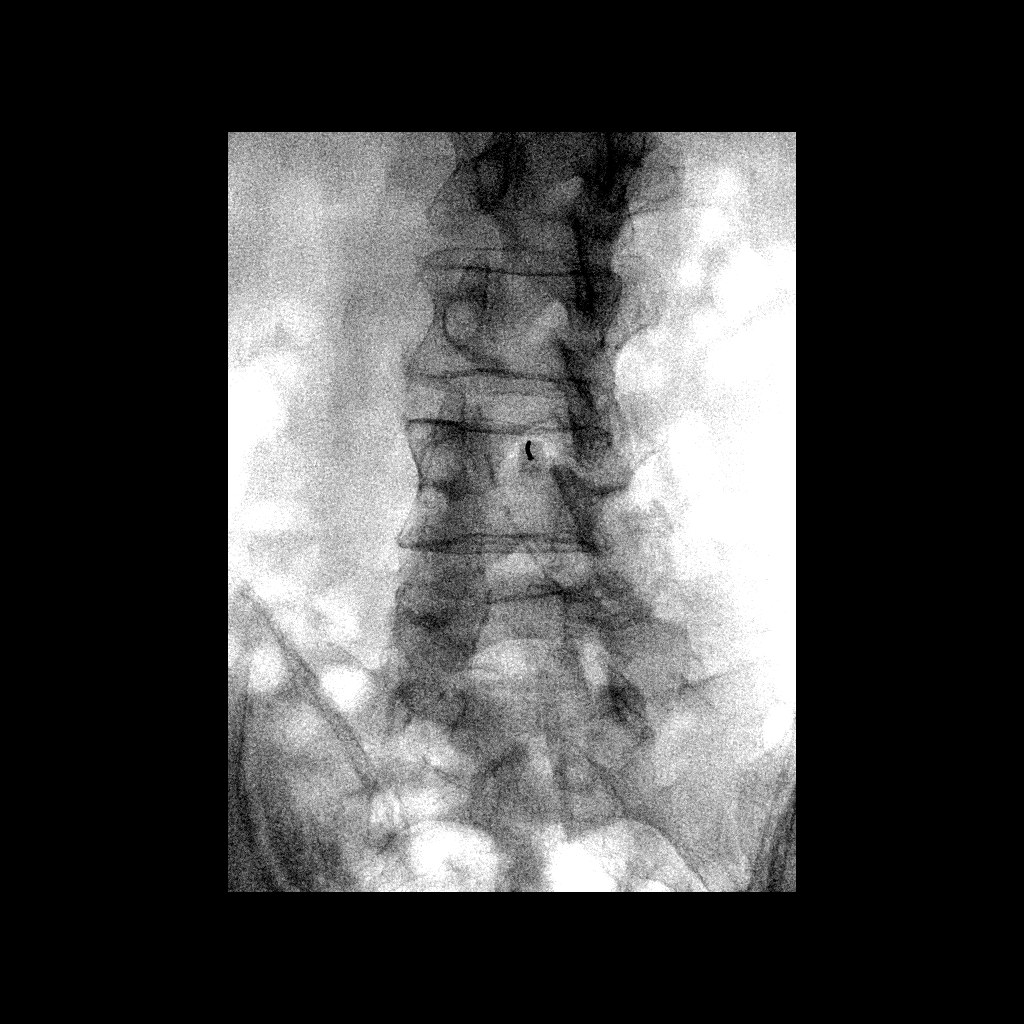

[Series 2: cp_injection proc · 2 of 2 frames shown (2 of 2)]
[frame 1/2]
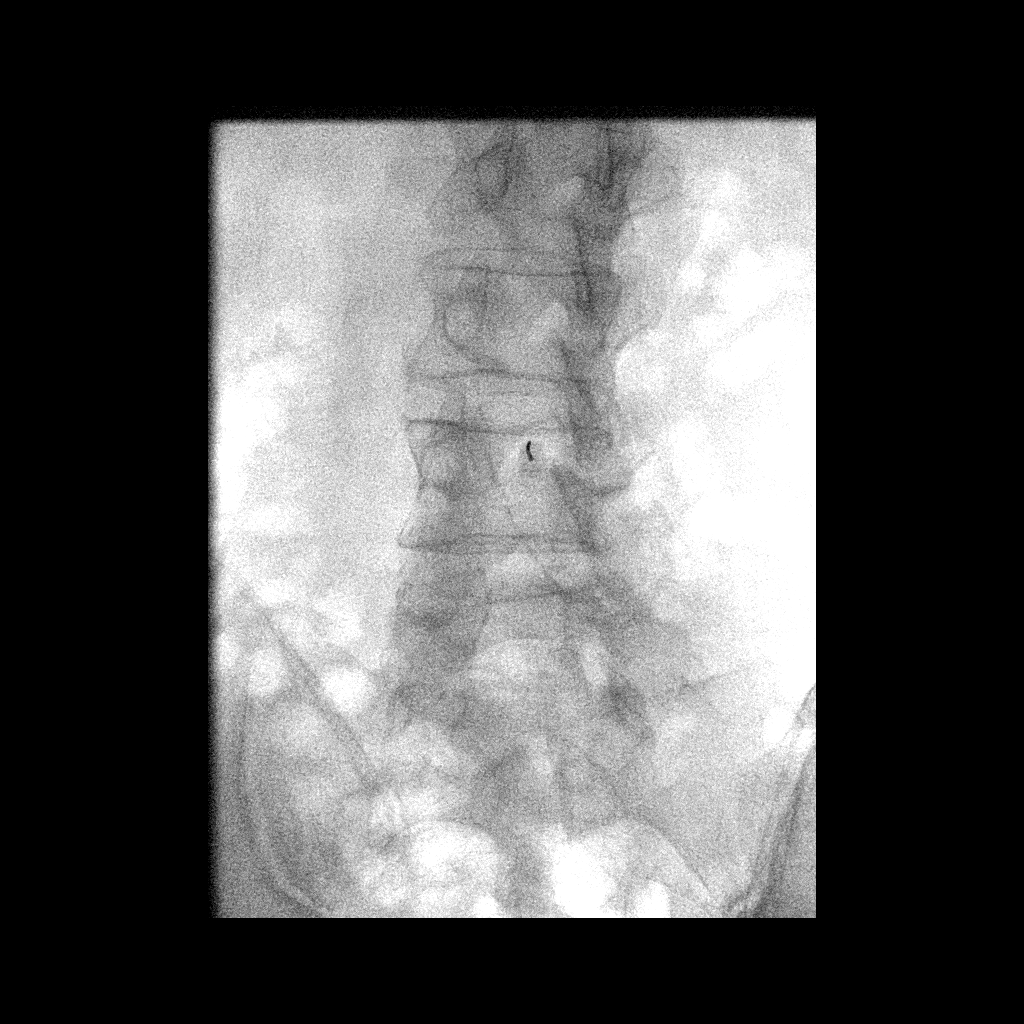
[frame 2/2]
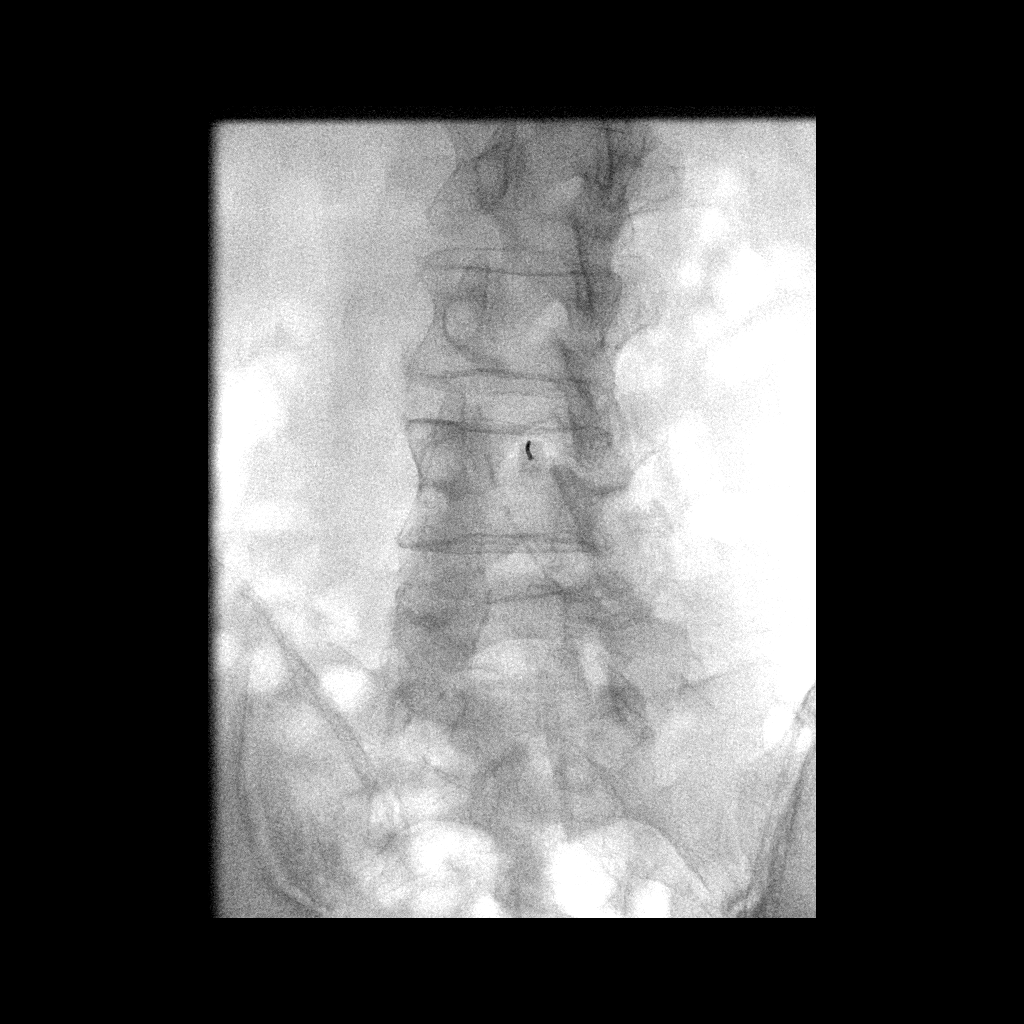

[3 of 3 positions shown; findings below may reference images not displayed]

IMPRESSION: Intrathecal injection of chemotherapy without complication.

## 2020-11-25 MED ORDER — DEXAMETHASONE 4 MG PO TABS
4.0000 mg | ORAL_TABLET | Freq: Once | ORAL | Status: AC
  Administered 2020-11-25: 4 mg via ORAL
  Filled 2020-11-25: qty 1

## 2020-11-25 MED ORDER — SODIUM CHLORIDE 0.9 % IV SOLN
Freq: Once | INTRAVENOUS | Status: DC
  Filled 2020-11-25: qty 2

## 2020-11-25 MED ORDER — SODIUM CHLORIDE (PF) 0.9 % IJ SOLN
Freq: Once | INTRAMUSCULAR | Status: AC
  Filled 2020-11-25: qty 0.48

## 2020-11-25 MED ORDER — ONDANSETRON HCL 4 MG PO TABS
4.0000 mg | ORAL_TABLET | Freq: Once | ORAL | Status: AC
  Administered 2020-11-25: 4 mg via ORAL
  Filled 2020-11-25: qty 1

## 2020-11-25 MED ORDER — ACETAMINOPHEN 325 MG PO TABS: 650.0000 mg | ORAL_TABLET | ORAL | Status: DC | PRN

## 2020-11-25 NOTE — Discharge Instructions (Signed)
Methotrexate Injection What is this medication? METHOTREXATE (METH oh TREX ate) treats inflammatory conditions such as arthritis and psoriasis. It works by decreasing inflammation, which can reduce pain and prevent long-term injury to the joints and skin. It may also be used to treat some types of cancer. It works by slowing down the growth of cancer cells. This medicine may be used for other purposes; ask your health care provider or pharmacist if you have questions. What should I tell my care team before I take this medication? They need to know if you have any of these conditions: Fluid in the stomach area or lungs If you often drink alcohol Infection or immune system problems Kidney disease Liver disease Low blood counts (white cells, platelets, or red blood cells) Lung disease Recent or ongoing radiation Recent or upcoming vaccine Stomach ulcers Ulcerative colitis An unusual or allergic reaction to methotrexate, other medications, foods, dyes, or preservatives Pregnant or trying to get pregnant Breast-feeding How should I use this medication? This medication is for infusion into a vein or for injection into muscle or into the spinal fluid (whichever applies). It is usually given in a hospital or clinic setting. In rare cases, you might get this medication at home. You will be taught how to give this medication. Use exactly as directed. Take your medication at regular intervals. Do not take your medication more often than directed. If this medication is used for arthritis or psoriasis, it should be taken weekly, NOT daily. It is important that you put your used needles and syringes in a special sharps container. Do not put them in a trash can. If you do not have a sharps container, call your pharmacist or care team to get one. Talk to your care team about the use of this medication in children. While this medication may be prescribed for children as young as 2 years for selected  conditions, precautions do apply. Overdosage: If you think you have taken too much of this medicine contact a poison control center or emergency room at once. NOTE: This medicine is only for you. Do not share this medicine with others. What if I miss a dose? It is important not to miss your dose. Call your care team if you are unable to keep an appointment. If you give yourself the medication, and you miss a dose, talk with your care team. Do not take double or extra doses. What may interact with this medication? Do not take this medication with any of the following: Acitretin This medication may also interact with the following: Aspirin or aspirin-like medications including salicylates Azathioprine Certain antibiotics like chloramphenicol, penicillin, tetracycline Certain medications that treat or prevent blood clots like warfarin, apixaban, dabigatran, and rivaroxaban Certain medications for stomach problems like esomeprazole, omeprazole, pantoprazole Cyclosporine Dapsone Diuretics Folic acid Gold Hydroxychloroquine Live virus vaccines Medications for infection like acyclovir, adefovir, amphotericin B, bacitracin, cidofovir, foscarnet, ganciclovir, gentamicin, pentamidine, vancomycin Mercaptopurine NSAIDs, medications for pain and inflammation, like ibuprofen or naproxen Pamidronate Pemetrexed Penicillamine Phenylbutazone Phenytoin Probenecid Pyrimethamine Retinoids such as isotretinoin and tretinoin Steroid medications like prednisone or cortisone Sulfonamides like sulfasalazine and trimethoprim/sulfamethoxazole Theophylline Zoledronic acid This list may not describe all possible interactions. Give your health care provider a list of all the medicines, herbs, non-prescription drugs, or dietary supplements you use. Also tell them if you smoke, drink alcohol, or use illegal drugs. Some items may interact with your medicine. What should I watch for while using this  medication? This medication may make you feel  generally unwell. This is not uncommon as chemotherapy can affect healthy cells as well as cancer cells. Report any side effects. Continue your course of treatment even though you feel ill unless your care team tells you to stop. Your condition will be monitored carefully while you are receiving this medication. Avoid alcoholic drinks. This medication can cause serious side effects. To reduce the risk, your care team may give you other medications to take before receiving this one. Be sure to follow the directions from your care team. This medication can make you more sensitive to the sun. Keep out of the sun. If you cannot avoid being in the sun, wear protective clothing and use sunscreen. Do not use sun lamps or tanning beds/booths. You may get drowsy or dizzy. Do not drive, use machinery, or do anything that needs mental alertness until you know how this medication affects you. Do not stand or sit up quickly, especially if you are an older patient. This reduces the risk of dizzy or fainting spells. You may need blood work while you are taking this medication. Call your care team for advice if you get a fever, chills or sore throat, or other symptoms of a cold or flu. Do not treat yourself. This medication decreases your body's ability to fight infections. Try to avoid being around people who are sick. This medication may increase your risk to bruise or bleed. Call your care team if you notice any unusual bleeding. Be careful brushing or flossing your teeth or using a toothpick because you may get an infection or bleed more easily. If you have any dental work done, tell your dentist you are receiving this medication Check with your care team if you get an attack of severe diarrhea, nausea and vomiting, or if you sweat a lot. The loss of too much body fluid can make it dangerous for you to take this medication. Talk to your care team about your risk of  cancer. You may be more at risk for certain types of cancers if you take this medication. Do not become pregnant while taking this medication or for 6 months after stopping it. Women should inform their care team if they wish to become pregnant or think they might be pregnant. Men should not father a child while taking this medication and for 3 months after stopping it. There is potential for serious harm to an unborn child. Talk to your care team for more information. Do not breast-feed an infant while taking this medication or for 1 week after stopping it. This medication may make it more difficult to get pregnant or father a child. Talk to your care team if you are concerned about your fertility. What side effects may I notice from receiving this medication? Side effects that you should report to your care team as soon as possible: Allergic reactions-skin rash, itching, hives, swelling of the face, lips, tongue, or throat Blood clot-pain, swelling, or warmth in the leg, shortness of breath, chest pain Dry cough, shortness of breath or trouble breathing Infection-fever, chills, cough, sore throat, wounds that don't heal, pain or trouble when passing urine, general feeling of discomfort or being unwell Kidney injury-decrease in the amount of urine, swelling of the ankles, hands, or feet Liver injury-right upper belly pain, loss of appetite, nausea, light-colored stool, dark yellow or brown urine, yellowing of the skin or eyes, unusual weakness or fatigue Low red blood cell count-unusual weakness or fatigue, dizziness, headache, trouble breathing Redness, blistering, peeling, or loosening  of the skin, including inside the mouth Seizures Unusual bruising or bleeding Side effects that usually do not require medical attention (report to your care team if they continue or are bothersome): Diarrhea Dizziness Hair loss Nausea Pain, redness, or swelling with sores inside the mouth or  throat Vomiting This list may not describe all possible side effects. Call your doctor for medical advice about side effects. You may report side effects to FDA at 1-800-FDA-1088. Where should I keep my medication? This medication is given in a hospital or clinic. It will not be stored at home. NOTE: This sheet is a summary. It may not cover all possible information. If you have questions about this medicine, talk to your doctor, pharmacist, or health care provider.  2022 Elsevier/Gold Standard (2020-02-17 12:51:29)  Lumbar Puncture Care After This sheet gives you information about how to care for yourself after your procedure. Your health care provider may also give you more specific instructions. If you have problems or questions, contact your health care provider. What can I expect after the procedure? After the procedure, it is common to have: Mild discomfort or pain at the puncture site. A mild headache that is relieved with pain medicines. Follow these instructions at home: Activity  Lie down flat or rest for as long as directed by your health care provider. Return to your normal activities as told by your health care provider. Ask your health care provider what activities are safe for you. Avoid lifting anything heavier than 10 lb (4.5 kg) for at least 12 hours after the procedure. Do not drive for 24 hours if you were given a medicine to help you relax (sedative) during your procedure. Do not drive or use heavy machinery while taking prescription pain medicine. Puncture site care Remove or change your bandage (dressing) as told by your health care provider. Check your puncture area every day for signs of infection. Check for: More pain. Redness or swelling. Fluid or blood leaking from the puncture site. Warmth. Pus or a bad smell. General instructions Take over-the-counter and prescription medicines only as told by your health care provider. Drink enough fluids to keep your  urine clear or pale yellow. Your health care provider may recommend drinking caffeine to prevent a headache. Keep all follow-up visits as told by your health care provider. This is important. Contact a health care provider if: You have fever or chills. You have nausea or vomiting. You have a headache that lasts for more than 2 days or does not get better with medicine. Get help right away if: You develop any of the following in your legs: Weakness. Numbness. Tingling. You are unable to control when you urinate or have a bowel movement (incontinence). You have signs of infection around your puncture site, such as: More pain. Redness or swelling. Fluid or blood leakage. Warmth. Pus or a bad smell. You are dizzy or you feel like you might faint. You have a severe headache, especially when you sit or stand. Summary A lumbar puncture is a procedure in which a small needle is inserted into the lower back to remove fluid that surrounds the brain and spinal cord. After this procedure, it is common to have a headache and pain around the needle insertion area. Lying flat, staying hydrated, and drinking caffeine can help prevent headaches. Monitor your needle insertion site for signs of infection, including warmth, fluid, or more pain. Get help right away if you develop leg weakness, leg numbness, incontinence, or severe headaches. This  information is not intended to replace advice given to you by your health care provider. Make sure you discuss any questions you have with your health care provider. Document Revised: 04/23/2020 Document Reviewed: 11/21/2019 Elsevier Patient Education  2022 [employer].

## 2020-12-01 ENCOUNTER — Telehealth: Payer: Self-pay | Admitting: Cardiovascular Disease

## 2020-12-01 MED ORDER — EMPAGLIFLOZIN 10 MG PO TABS: 10.0000 mg | ORAL_TABLET | Freq: Every day | ORAL | 3 refills | Status: DC

## 2020-12-01 NOTE — Telephone Encounter (Signed)
Called pt he wants to know can he take a Torsemide in the morning and one at 12 noon instead of at night. Due to not getting rest due to being up to the bathroom.  Pt wants clarification on these two medications: Digoxin and Metoprolol. He was told Digoxin was prescribed to speed up his heart and the metoprolol "I've been on that one for awhile. I don't remember why they prescribed it." "It's not so much the reason her prescribed it. It's more so when someone in the medical field tells you for lack of better terms they contraindicate each other I want to know." Will forward message to Dr. [name] for clarification.

## 2020-12-01 NOTE — Telephone Encounter (Signed)
Called pt to let him know Dr. Kathalene Frames recommendation. Will correct the med list to reflect this change. Pt states he just refilled Jardiance and was only given 16 tablets. There are not any refills left on the prescription. According to last office notes, pt is to continue Jardiance. Will send refill in for pt.

## 2020-12-01 NOTE — Addendum Note (Signed)
Addended by: Orvan July on: 12/01/2020 01:49 PM   Modules accepted: Orders

## 2020-12-01 NOTE — Telephone Encounter (Signed)
Pt c/o medication issue:  1. Name of Medication:  torsemide (DEMADEX) 20 MG tablet digoxin (LANOXIN) 0.125 MG tablet metoprolol (TOPROL XL) 200 MG 24 hr tablet  2. How are you currently taking this medication (dosage and times per day)?  Torsemide: Patient taking two pills daily, one at 8:00 am, one at 8:00 pm  Digoxin and Metoprolol: As Directed  3. Are you having a reaction (difficulty breathing--STAT)?   4. What is your medication issue? In regards to the  torsemide (DEMADEX) 20 MG tablet: The patient said he has to get up multiple times during the night to use the restroom. He wanted to know if he could cut back to one pill and take it in the morning  In regards to  digoxin (LANOXIN) 0.125 MG tablet & metoprolol (TOPROL XL) 200 MG 24 hr tablet:  Patient went to [facility] for a spinal procedure. While he was there the hospital told him that one of these medications slows down his heart rate and the other is supposed to raise his heart rate. He just wants to know if he is supposed to be taking both medications.

## 2020-12-03 ENCOUNTER — Ambulatory Visit (HOSPITAL_COMMUNITY)
Admission: RE | Admit: 2020-12-03 | Discharge: 2020-12-03 | Disposition: A | Discharge status: Home / Self Care | Payer: Medicare HMO | Source: Ambulatory Visit | Attending: Hematology | Admitting: Hematology

## 2020-12-03 DIAGNOSIS — K449 Diaphragmatic hernia without obstruction or gangrene: Secondary | ICD-10-CM | POA: Diagnosis not present

## 2020-12-03 DIAGNOSIS — K8020 Calculus of gallbladder without cholecystitis without obstruction: Secondary | ICD-10-CM | POA: Diagnosis not present

## 2020-12-03 DIAGNOSIS — C8330 Diffuse large B-cell lymphoma, unspecified site: Secondary | ICD-10-CM | POA: Diagnosis not present

## 2020-12-03 DIAGNOSIS — Z951 Presence of aortocoronary bypass graft: Secondary | ICD-10-CM | POA: Diagnosis not present

## 2020-12-03 DIAGNOSIS — I517 Cardiomegaly: Secondary | ICD-10-CM | POA: Diagnosis not present

## 2020-12-03 DIAGNOSIS — J320 Chronic maxillary sinusitis: Secondary | ICD-10-CM | POA: Insufficient documentation

## 2020-12-03 DIAGNOSIS — Z9079 Acquired absence of other genital organ(s): Secondary | ICD-10-CM | POA: Diagnosis not present

## 2020-12-03 DIAGNOSIS — I2510 Atherosclerotic heart disease of native coronary artery without angina pectoris: Secondary | ICD-10-CM | POA: Insufficient documentation

## 2020-12-03 DIAGNOSIS — N200 Calculus of kidney: Secondary | ICD-10-CM | POA: Diagnosis not present

## 2020-12-03 DIAGNOSIS — I700 Atherosclerosis of aorta: Secondary | ICD-10-CM | POA: Diagnosis not present

## 2020-12-03 LAB — GLUCOSE, CAPILLARY: Glucose-Capillary: 149 mg/dL — ABNORMAL HIGH (ref 70–99)

## 2020-12-03 IMAGING — PT NM PET TUM IMG RESTAG (PS) SKULL BASE T - THIGH
1 of 8 series · 1 of 25 positions shown · non-contrast
Comparison: Multiple exams, including [DATE]

CLINICAL DATA: Subsequent treatment strategy for testicular large
B-cell lymphoma, prior orchiectomy in [DATE]. Status post 6
cycles of chemotherapy completed in [DATE].

EXAM:
NUCLEAR MEDICINE PET SKULL BASE TO THIGH
TECHNIQUE: 8.5 mCi F-18 FDG was injected intravenously. Full-ring PET imaging
was performed from the skull base to thigh after the radiotracer. CT
data was obtained and used for attenuation correction and anatomic
localization.
Fasting blood glucose: 149 mg/dl

[Series 4: ct sk_thigh 5.0 bf37 · axial · 5.0mm · 0.98mm/px · 1 of 229 slices shown]
[im 229/229  brain]
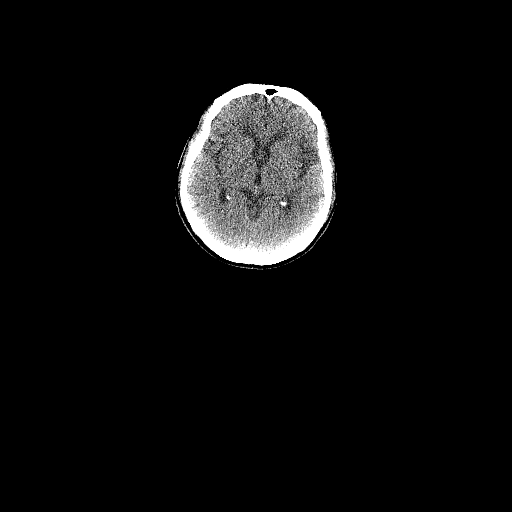

[1 of 25 positions shown; findings below may reference images not displayed]

FINDINGS: Mediastinal blood pool activity: SUV max

Liver activity: SUV max

NECK: No significant abnormal hypermetabolic activity in this
region.

Incidental CT findings: Mild left common carotid atherosclerotic
calcification. Small mucous retention cyst in the right maxillary
sinus.

CHEST: 0.5 cm right paratracheal lymph node on image 64 series 4 has
maximum SUV of 1.7, [HOSPITAL] 2 (previously measuring 1.0 cm in
diameter with maximum SUV 2.7). Subcarinal lymph node 0.6 cm in
short axis maximum SUV 2.0 ([HOSPITAL] 2), formerly 1.1 cm with
maximum SUV of 2.4.

Incidental CT findings: Right Port-A-Cath tip: SVC. Coronary, aortic
arch, and branch vessel atherosclerotic vascular disease. Prior
CABG. Small type 1 hiatal hernia. Borderline cardiomegaly.

ABDOMEN/PELVIS: Scattered activity in bowel without associated
abnormality on the CT data attributed to physiologic activity.

Incidental CT findings: Cholelithiasis. Atherosclerosis is present,
including aortoiliac atherosclerotic disease. Postoperative findings
in the sigmoid colon. Punctate nonobstructive renal calculi. Left
orchiectomy.

SKELETON: No significant abnormal hypermetabolic activity in this
region.

Incidental CT findings: Pre acromial os acromiale on the right.
IMPRESSION: 1. No findings of active lymphoma. Previous upper normal size
thoracic lymph nodes are currently normal in size and activity.
2. Prior left orchiectomy.
3. Other imaging findings of potential clinical significance: Aortic
Atherosclerosis ([M3]-[M3]). Coronary atherosclerosis and prior
CABG. Small type 1 hiatal hernia. Borderline cardiomegaly.
Cholelithiasis. Punctate bilateral nonobstructive renal calculi.
Mild chronic right maxillary sinusitis.

## 2020-12-03 MED ORDER — FLUDEOXYGLUCOSE F - 18 (FDG) INJECTION
8.9000 | Freq: Once | INTRAVENOUS | Status: AC
  Administered 2020-12-03: 8.51 via INTRAVENOUS

## 2020-12-04 ENCOUNTER — Other Ambulatory Visit: Payer: Self-pay

## 2020-12-04 DIAGNOSIS — C8330 Diffuse large B-cell lymphoma, unspecified site: Secondary | ICD-10-CM

## 2020-12-07 ENCOUNTER — Inpatient Hospital Stay: Payer: Medicare HMO

## 2020-12-07 ENCOUNTER — Other Ambulatory Visit: Payer: Self-pay

## 2020-12-07 ENCOUNTER — Inpatient Hospital Stay: Payer: Medicare HMO | Attending: Hematology | Admitting: Hematology

## 2020-12-07 VITALS — BP 96/62 | HR 96 | Temp 97.3°F | Resp 18 | Wt 174.9 lb

## 2020-12-07 DIAGNOSIS — Z951 Presence of aortocoronary bypass graft: Secondary | ICD-10-CM | POA: Diagnosis not present

## 2020-12-07 DIAGNOSIS — C8330 Diffuse large B-cell lymphoma, unspecified site: Secondary | ICD-10-CM

## 2020-12-07 DIAGNOSIS — Z95828 Presence of other vascular implants and grafts: Secondary | ICD-10-CM

## 2020-12-07 DIAGNOSIS — I10 Essential (primary) hypertension: Secondary | ICD-10-CM | POA: Diagnosis not present

## 2020-12-07 DIAGNOSIS — K219 Gastro-esophageal reflux disease without esophagitis: Secondary | ICD-10-CM | POA: Insufficient documentation

## 2020-12-07 DIAGNOSIS — I252 Old myocardial infarction: Secondary | ICD-10-CM | POA: Diagnosis not present

## 2020-12-07 DIAGNOSIS — Z87891 Personal history of nicotine dependence: Secondary | ICD-10-CM | POA: Insufficient documentation

## 2020-12-07 DIAGNOSIS — E119 Type 2 diabetes mellitus without complications: Secondary | ICD-10-CM | POA: Diagnosis not present

## 2020-12-07 DIAGNOSIS — Z85828 Personal history of other malignant neoplasm of skin: Secondary | ICD-10-CM | POA: Diagnosis not present

## 2020-12-07 DIAGNOSIS — E785 Hyperlipidemia, unspecified: Secondary | ICD-10-CM | POA: Diagnosis not present

## 2020-12-07 DIAGNOSIS — C8335 Diffuse large B-cell lymphoma, lymph nodes of inguinal region and lower limb: Secondary | ICD-10-CM | POA: Diagnosis not present

## 2020-12-07 DIAGNOSIS — I255 Ischemic cardiomyopathy: Secondary | ICD-10-CM | POA: Insufficient documentation

## 2020-12-07 LAB — CMP (CANCER CENTER ONLY)
ALT: 15 U/L (ref 0–44)
AST: 16 U/L (ref 15–41)
Albumin: 3.4 g/dL — ABNORMAL LOW (ref 3.5–5.0)
Alkaline Phosphatase: 73 U/L (ref 38–126)
Anion gap: 13 (ref 5–15)
BUN: 33 mg/dL — ABNORMAL HIGH (ref 8–23)
CO2: 21 mmol/L — ABNORMAL LOW (ref 22–32)
Calcium: 8.4 mg/dL — ABNORMAL LOW (ref 8.9–10.3)
Chloride: 107 mmol/L (ref 98–111)
Creatinine: 1.7 mg/dL — ABNORMAL HIGH (ref 0.61–1.24)
GFR, Estimated: 41 mL/min — ABNORMAL LOW (ref >60)
Glucose, Bld: 162 mg/dL — ABNORMAL HIGH (ref 70–99)
Potassium: 4.1 mmol/L (ref 3.5–5.1)
Sodium: 141 mmol/L (ref 135–145)
Total Bilirubin: 0.7 mg/dL (ref 0.3–1.2)
Total Protein: 6.6 g/dL (ref 6.5–8.1)

## 2020-12-07 LAB — CBC WITH DIFFERENTIAL (CANCER CENTER ONLY)
Abs Immature Granulocytes: 0.15 10*3/uL — ABNORMAL HIGH (ref 0.00–0.07)
Basophils Absolute: 0.1 10*3/uL (ref 0.0–0.1)
Basophils Relative: 1 %
Eosinophils Absolute: 0.2 10*3/uL (ref 0.0–0.5)
Eosinophils Relative: 2 %
HCT: 30.9 % — ABNORMAL LOW (ref 39.0–52.0)
Hemoglobin: 9.8 g/dL — ABNORMAL LOW (ref 13.0–17.0)
Immature Granulocytes: 2 %
Lymphocytes Relative: 4 %
Lymphs Abs: 0.3 10*3/uL — ABNORMAL LOW (ref 0.7–4.0)
MCH: 28.7 pg (ref 26.0–34.0)
MCHC: 31.7 g/dL (ref 30.0–36.0)
MCV: 90.6 fL (ref 80.0–100.0)
Monocytes Absolute: 0.9 10*3/uL (ref 0.1–1.0)
Monocytes Relative: 11 %
Neutro Abs: 6.1 10*3/uL (ref 1.7–7.7)
Neutrophils Relative %: 80 %
Platelet Count: 260 10*3/uL (ref 150–400)
RBC: 3.41 MIL/uL — ABNORMAL LOW (ref 4.22–5.81)
RDW: 19.8 % — ABNORMAL HIGH (ref 11.5–15.5)
WBC Count: 7.6 10*3/uL (ref 4.0–10.5)
nRBC: 0 % (ref 0.0–0.2)

## 2020-12-07 LAB — SAMPLE TO BLOOD BANK

## 2020-12-07 MED ORDER — SODIUM CHLORIDE 0.9% FLUSH
10.0000 mL | Freq: Once | INTRAVENOUS | Status: AC
  Administered 2020-12-07: 10 mL

## 2020-12-07 MED ORDER — HEPARIN SOD (PORK) LOCK FLUSH 100 UNIT/ML IV SOLN
500.0000 [IU] | Freq: Once | INTRAVENOUS | Status: AC
  Administered 2020-12-07: 500 [IU]

## 2020-12-08 ENCOUNTER — Telehealth: Payer: Self-pay | Admitting: Hematology

## 2020-12-08 NOTE — Telephone Encounter (Signed)
Scheduled follow-up appointment per 11/14 los. Patient's wife is aware.

## 2020-12-13 ENCOUNTER — Encounter: Payer: Self-pay | Admitting: Hematology

## 2020-12-14 DIAGNOSIS — C6212 Malignant neoplasm of descended left testis: Secondary | ICD-10-CM | POA: Diagnosis not present

## 2020-12-14 DIAGNOSIS — N430 Encysted hydrocele: Secondary | ICD-10-CM | POA: Diagnosis not present

## 2020-12-15 NOTE — Progress Notes (Signed)
[name]    HEMATOLOGY/ONCOLOGY CLINIC NOTE  Date of Service: .12/07/2020   Patient Care Team: [name], DO as PCP - General (Family Medicine) O'Neal, [name], MD as PCP - Cardiology (Cardiology)  CHIEF COMPLAINTS/PURPOSE OF CONSULTATION:  Primary testicular large B cell lymphoma  HISTORY OF PRESENTING ILLNESS:   Roy [name] is a wonderful 75 y.o. male who has been referred to [place] by Dr Maudie Mercury for evaluation and management of primary testicular large B-cell lymphoma  Patient has a history of hypertension, borderline diabetes, previous nonischemic cardiomyopathy but with a recent MI,, CHF who presented with a left testicular swelling end of sept/ early oct 2021.  He notes that he was seen by Urology - Dr Minette Brine and had an ultrasound--possible infection was suspected and he received 2 rounds of abx without significant improvement.  He notes he received 2 subsequent follow-up ultrasounds in December in January and finally had left transinguinal orchiectomy on 03/11/2020. He notes he had persistent testicular swelling post operatively which required drainage.  He also notes having issues with urinary retention and had a urinary catheter for 2 weeks, abx+  Pathology showed primary testicular large B-cell lymphoma.  No other additional imaging studies for staging have been done at this time.  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  Patient was admitted to the hospital on 03/21/2020 with acute onset dyspnea epigastric pain and cough with pink frothy sputum.  He was noted to have hypoxia with oxygen saturation of 82% on arrival.  He was noted to have non-ST elevation MI on 2/26.  CTA chest ruled out PE.  His symptoms from fluid with aggressive diuresis.  He was noted to have severe triple-vessel disease on cath with an ejection fraction of 25 to 35% with no significant valvular lesions.  He subsequently had CORONARY ARTERY BYPASS  GRAFTING (CABG), ON PUMP, TIMES FOUR, USING BILATERAL INTERNAL MAMMARY ARTERIES AND LEFT RADIAL ARTERY .. On March 25, 2020.  Patient notes he is gradually recovering from his cardiac surgery notes being fatigued having gone through multiple urologic issues, skin surgery for squamous of carcinoma of the nose, acute MI CABG x4.  Patient notes no fevers no chills no night sweats. He notes he might have lost some weight but difficult to quantify in the setting of fluid overload and diuresis. Notes he is starting to eat a little better. Minimal left scrotal swelling. No other acute new focal symptoms.  INTERVAL HISTORY   Roy [name] is a wonderful 75 y.o. male who is here today for evaluation and management of Primary testicular large B cell lymphoma.  Patient notes he tolerated his third and last planned cycle of intrathecal methotrexate without any acute concerns. He notes that he has been feeling well and his fatigue from the chemotherapy is resolving. Symptoms of his urinary tract infection have resolved. He has been eating better. His leg swelling is under good control with diuretics he is needing less of these. No testicular pain or swelling.  Labs today 12/07/2020 CBC showed hemoglobin of 9.8 with a WBC count of 7.6k and platelets of 260k. CMP stable creatinine 1.7  Patient had a PET CT scan to evaluate response post treatment on 12/03/2020 which showed No findings of active lymphoma. Previous upper normal size thoracic lymph nodes are currently normal in size and activity.Prior left orchiectomy.  No fevers no chills no night sweats. No new lumps or bumps. No new testicular pain or  swelling.   MEDICAL HISTORY:  Past Medical History:  Diagnosis Date   Allergic rhinitis    Arthritis    wrists   Cardiomyopathy, nonischemic [facility])    followed by cardiology--- dr Delton See---  2016 ef 45-50% ,  2016 nuclear ef 37%,  2017 per echo ef 50-55%   CHF (congestive heart failure), NYHA class  III (HCC) 03/21/2020   Coronary artery disease    GERD (gastroesophageal reflux disease)    Hiatal hernia    History of kidney stones    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;   01/ 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   History of syncope (03-06-2020 pt stated has not had sycopal episode in few years, stated it seems to happen in extreme hot conditions)   cardiologist--- dr Eloy End--- dx recurrent syncope;  nuclear study 11-03-2014 intermediate risk w/ no ischemia, apical hypokinesis, nuclear ef 37%;  event monitor-- 12-21-2015 SB/ ST  no pauses/ arrythmia's;  echo 08-03-2015 ef 50-55%   Hyperlipidemia    Hypertension    followed by pcp   Hypovitaminosis D    Mass of left testicle    Nocturia    Plantar fasciitis    Presence of surgical incision    01/ 2022  moh's w/ skin graft of nose, per pt still healing and wear bandage daily   Type 2 diabetes mellitus (HCC)    pt is adament that he is not and have been told he is a diabetic but a borderline;  followed by pcp, in pcp note states DM2 and takes 2 meds daily   Wears glasses    Wears hearing aid in both ears    Weight loss 11/16/2020    SURGICAL HISTORY: Past Surgical History:  Procedure Laterality Date   COLONOSCOPY  last one 01-30-2017   CORONARY ARTERY BYPASS GRAFT N/A 03/25/2020   Procedure: CORONARY ARTERY BYPASS GRAFTING (CABG), ON PUMP, TIMES FOUR, USING BILATERAL INTERNAL MAMMARY ARTERIES AND LEFT RADIAL ARTERY;  Surgeon: Linden Dolin, MD;  Location: [facility] OR;  Service: Open Heart Surgery;  Laterality: N/A;   IR IMAGING GUIDED PORT INSERTION  07/06/2020   LOW ANTERIOR RESECTION RECTUM W/ COLOPROCTOSTOMY  05/2003   MOHS SURGERY  01/2020   nose w/ graft   ORCHIECTOMY Left 03/11/2020   Procedure: Clearance Coots;  Surgeon: Rene Paci, MD;  [facility];  Service: Urology;  Laterality: Left;  ONLY NEEDS 60 MIN   RADIAL ARTERY HARVEST Left  03/25/2020   Procedure: LEFT RADIAL ARTERY HARVEST;  Surgeon: Linden Dolin, MD;  Location: [facility] OR;  Service: Open Heart Surgery;  Laterality: Left;   RIGHT/LEFT HEART CATH AND CORONARY ANGIOGRAPHY N/A 03/23/2020   Procedure: RIGHT/LEFT HEART CATH AND CORONARY ANGIOGRAPHY;  Surgeon: Corky Crafts, MD;  Location[facility] INVASIVE CV LAB;  Service: Cardiovascular;  Laterality: N/A;   SHOULDER SURGERY Right 1992; 07/ 2021   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 Left 12/24/2008   left ear and nose    TEE WITHOUT CARDIOVERSION N/A 03/25/2020   Procedure: TRANSESOPHAGEAL ECHOCARDIOGRAM (TEE);  Surgeon: Linden Dolin, MD;  Location: [facility] OR;  Service: Open Heart Surgery;  Laterality: N/A;   TONSILLECTOMY AND ADENOIDECTOMY  child   UPPER GASTROINTESTINAL ENDOSCOPY  last one 06-06-2017    SOCIAL HISTORY: Social History   Socioeconomic History   Marital status: Married    Spouse name: Not on file  Number of children: Not on file   Years of education: Not on file   Highest education level: Not on file  Occupational History   Not on file  Tobacco Use   Smoking status: Former    Types: Pipe    Quit date: 11/29/1976    Years since quitting: 44.0   Smokeless tobacco: Never  Vaping Use   Vaping Use: Never used  Substance and Sexual Activity   Alcohol use: Yes    Alcohol/week: 0.0 standard drinks    Comment: Occasional drink    Drug use: Never   Sexual activity: Not on file  Other Topics Concern   Not on file  Social History Narrative   Not on file   Social Determinants of Health   Financial Resource Strain: Not on file  Food Insecurity: Not on file  Transportation Needs: Not on file  Physical Activity: Not on file  Stress: Not on file  Social Connections: Not on file  Intimate Partner Violence: Not on file    FAMILY HISTORY: Family History  Problem Relation Age of Onset   Heart attack Mother    Stroke Father    Colon cancer Neg Hx    Esophageal cancer Neg Hx     Pancreatic cancer Neg Hx    Rectal cancer Neg Hx    Stomach cancer Neg Hx    Colon polyps Neg Hx     ALLERGIES:  has No Known Allergies.  MEDICATIONS:  Current Outpatient Medications  Medication Sig Dispense Refill   acetaminophen (TYLENOL) 325 MG tablet Take 2 tablets (650 mg total) by mouth every 4 (four) hours as needed for headache or mild pain.     Apoaequorin (PREVAGEN PO) Take 1 tablet by mouth at bedtime.     aspirin EC 81 MG tablet Take 81 mg by mouth daily. Swallow whole.     atorvastatin (LIPITOR) 40 MG tablet Take 1 tablet (40 mg total) by mouth daily. 90 tablet 3   cholecalciferol (VITAMIN D) 1000 UNITS tablet Take 2,000 Units by mouth See admin instructions. Mon-friday     COVID-19 mRNA bivalent vaccine, Pfizer, (PFIZER COVID-19 VAC BIVALENT) injection Inject into the muscle. 0.3 mL 0   digoxin (LANOXIN) 0.125 MG tablet Take 1 tablet (0.125 mg total) by mouth daily. 90 tablet 3   empagliflozin (JARDIANCE) 10 MG TABS tablet Take 1 tablet (10 mg total) by mouth daily before breakfast. 90 tablet 3   ENTRESTO 24-26 MG TAKE 1 TABLET BY MOUTH 2 TIMES DAILY. 60 tablet 3   glucosamine-chondroitin 500-400 MG tablet Take 1 tablet by mouth. 5 times weekly     LORazepam (ATIVAN) 0.5 MG tablet Take 1 tablet (0.5 mg total) by mouth every 6 (six) hours as needed for anxiety. Put 1 pill under your tongue every 6 hrs, if needed, for nausea 30 tablet 0   metFORMIN (GLUCOPHAGE) 1000 MG tablet Take 1,000 mg by mouth 2 (two) times daily with a meal.      metoprolol (TOPROL XL) 200 MG 24 hr tablet Take 1 tablet (200 mg total) by mouth daily. 90 tablet 3   Multiple Vitamin (MULTIVITAMIN WITH MINERALS) TABS tablet Take 1 tablet by mouth. 5 times weekly     omeprazole (PRILOSEC) 20 MG capsule Take 20 mg by mouth daily.     ondansetron (ZOFRAN) 8 MG tablet Take 1 tablet (8 mg total) by mouth every 8 (eight) hours as needed for nausea or vomiting. Take 1 pill every 8 hrs for 3  days. 30 tablet 3    potassium chloride SA (KLOR-CON M20) 20 MEQ tablet Take 1 tablet (20 mEq total) by mouth daily. 90 tablet 3   prochlorperazine (COMPAZINE) 10 MG tablet Take 1 tablet (10 mg total) by mouth every 8 (eight) hours as needed for nausea or vomiting. 40 tablet 2   spironolactone (ALDACTONE) 25 MG tablet Take 0.5 tablets (12.5 mg total) by mouth daily. 60 tablet 1   tamsulosin (FLOMAX) 0.4 MG CAPS capsule Take 0.4 mg by mouth at bedtime.     torsemide (DEMADEX) 20 MG tablet Take 2 tablets (40 mg total) by mouth daily. 180 tablet 3   vitamin B-12 (CYANOCOBALAMIN) 1000 MCG tablet Take 1,000 mcg by mouth. 5 times a week     Current Facility-Administered Medications  Medication Dose Route Frequency Provider Last Rate Last Admin   potassium chloride (KLOR-CON) CR tablet 20 mEq  20 mEq Oral BID [name], MD        REVIEW OF SYSTEMS:   .10 Point review of Systems was done is negative except as noted above.   PHYSICAL EXAMINATION: ECOG PERFORMANCE STATUS: 2 - Symptomatic, <50% confined to bed  .BP 96/62   Pulse 96   Temp (!) 97.3 F (36.3 C)   Resp 18   Wt 174 lb 14.4 oz (79.3 kg)   SpO2 100%   BMI 23.08 kg/m  . GENERAL:alert, in no acute distress and comfortable SKIN: no acute rashes, no significant lesions EYES: conjunctiva are pink and non-injected, sclera anicteric OROPHARYNX: MMM, no exudates, no oropharyngeal erythema or ulceration NECK: supple, no JVD LYMPH:  no palpable lymphadenopathy in the cervical, axillary or inguinal regions LUNGS: clear to auscultation b/l with normal respiratory effort HEART: regular rate & rhythm ABDOMEN:  normoactive bowel sounds , non tender, not distended. Extremity: no pedal edema PSYCH: alert & oriented x 3 with fluent speech NEURO: no focal motor/sensory deficits    LABORATORY DATA:  I have reviewed the data as listed   CBC Latest Ref Rng & Units 12/07/2020 11/09/2020 10/26/2020  WBC 4.0 - 10.5 K/uL 7.6 14.4(H) 30.0(H)  Hemoglobin  13.0 - 17.0 g/dL 9.8(L) 9.4(L) 9.0(L)  Hematocrit 39.0 - 52.0 % 30.9(L) 28.8(L) 28.8(L)  Platelets 150 - 400 K/uL 260 136(L) 204    . CMP Latest Ref Rng & Units 12/07/2020 11/16/2020 11/09/2020  Glucose 70 - 99 mg/dL 162(H) 128(H) 130(H)  BUN 8 - 23 mg/dL 33(H) 22 20  Creatinine 0.61 - 1.24 mg/dL 1.70(H) 1.62(H) 1.67(H)  Sodium 135 - 145 mmol/L 141 145(H) 145  Potassium 3.5 - 5.1 mmol/L 4.1 4.1 3.5  Chloride 98 - 111 mmol/L 107 98 98  CO2 22 - 32 mmol/L 21(L) 28 29  Calcium 8.9 - 10.3 mg/dL 8.4(L) 7.3(L) 7.6(L)  Total Protein 6.5 - 8.1 g/dL 6.6 - 6.3(L)  Total Bilirubin 0.3 - 1.2 mg/dL 0.7 - 1.4(H)  Alkaline Phos 38 - 126 U/L 73 - 123  AST 15 - 41 U/L 16 - 20  ALT 0 - 44 U/L 15 - 34   . Lab Results  Component Value Date   LDH 199 (H) 07/23/2020   SURGICAL PATHOLOGY   THIS IS AN ADDENDUM REPORT   CASE: [id number]  PATIENT: Eleftherios Kluger  Surgical Pathology Report  Addendum    Reason for Addendum #1:  Molecular Genetic Test Results, FISH   Clinical History: Left testicular mass (crm)      FINAL MICROSCOPIC DIAGNOSIS:   A. TESTICLE, LEFT, ORCHIECTOMY:  -  Diffuse aggressive large B-cell lymphoma  -  See comment    COMMENT:   Sections of testicle show architectural effacement by sheets of large  lymphoid cells with vesicular nuclei and pale cytoplasm.  There is  admixed apoptotic debris and increased mitotic activity.  By  immunohistochemistry, the large lymphoid cells are positive for CD20,  CD5 (dim), BCL6 (dim), MUM1, and BCL2.  They are negative for CD10, CD30  (<1%), cyclin D1, and TdT.  The Ki67 proliferation index is up to  approximately 70%.  CD3 highlights small T-cells in the background. EBV  is negative by in situ hybridization.   Together, the findings support the diagnosis of a diffuse aggressive  large B-cell lymphoma. The differential diagnosis includes a diffuse  large B-cell lymphoma, NOS, with activated B-cell subtype by the [facility]  algorithm  and high-grade B-cell lymphoma with MYC and BCL2 or BCL6  rearrangements.  FISH for BCL2, BCL6, and MYC rearrangements will be  performed. Correlation with clinical and radiographic findings is  recommended for consideration of a primary testicular lymphoma.   Result reported to L. Gibson on 03/16/20 at 1720 by S. O'Neill.    ADDENDUM:   FISH RESULTS:   Results: NORMAL   Interpretation:   BCL6 rearrangement:      Not Detected  MYC rearrangement:  Not Detected  MYC amplification:  Not Detected  BCL6 rearrangement:      Not Detected   Please note this testing was performed and interpreted by an outside  facility ([facility]).  This addendum is only being added to provide a  summary of the results for report completeness.  Please see electronic  medical record for a copy of the full report.   RADIOGRAPHIC STUDIES: I have personally reviewed the radiological images as listed and agreed with the findings in the report. NM PET Image Restag (PS) Skull Base To Thigh  Result Date: 12/04/2020 CLINICAL DATA:  Subsequent treatment strategy for testicular large B-cell lymphoma, prior orchiectomy in February 2022. Status post 6 cycles of chemotherapy completed in October 2022. EXAM: NUCLEAR MEDICINE PET SKULL BASE TO THIGH TECHNIQUE: 8.5 mCi F-18 FDG was injected intravenously. Full-ring PET imaging was performed from the skull base to thigh after the radiotracer. CT data was obtained and used for attenuation correction and anatomic localization. Fasting blood glucose: 149 mg/dl COMPARISON:  Multiple exams, including 05/11/2020 FINDINGS: Mediastinal blood pool activity: SUV max 2.6 Liver activity: SUV max 3.7 NECK: No significant abnormal hypermetabolic activity in this region. Incidental CT findings: Mild left common carotid atherosclerotic calcification. Small mucous retention cyst in the right maxillary sinus. CHEST: 0.5 cm right paratracheal lymph node on image 64 series 4 has maximum SUV of  1.7, Deauville 2 (previously measuring 1.0 cm in diameter with maximum SUV 2.7). Subcarinal lymph node 0.6 cm in short axis maximum SUV 2.0 (Deauville 2), formerly 1.1 cm with maximum SUV of 2.4. Incidental CT findings: Right Port-A-Cath tip: SVC. Coronary, aortic arch, and branch vessel atherosclerotic vascular disease. Prior CABG. Small type 1 hiatal hernia. Borderline cardiomegaly. ABDOMEN/PELVIS: Scattered activity in bowel without associated abnormality on the CT data attributed to physiologic activity. Incidental CT findings: Cholelithiasis. Atherosclerosis is present, including aortoiliac atherosclerotic disease. Postoperative findings in the sigmoid colon. Punctate nonobstructive renal calculi. Left orchiectomy. SKELETON: No significant abnormal hypermetabolic activity in this region. 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 IMPRESSION: 1. No findings of active lymphoma. Previous upper normal size thoracic lymph nodes are currently normal in size and  activity. 2. Prior left orchiectomy. 3. Other imaging findings of potential clinical significance: Aortic Atherosclerosis (ICD10-I70.0). Coronary atherosclerosis and prior CABG. Small type 1 hiatal hernia. Borderline cardiomegaly. Cholelithiasis. Punctate bilateral nonobstructive renal calculi. Mild chronic right maxillary sinusitis. Electronically Signed   By: Van Clines M.D.   On: 12/04/2020 07:46   DG FLUORO GUIDED LOC OF NEEDLE/CATH TIP FOR SPINAL INJECT LT  Result Date: 11/25/2020 CLINICAL DATA:  Lymphoma. EXAM: FLUOROSCOPICALLY GUIDED LUMBAR PUNCTURE FOR INTRATHECAL CHEMOTHERAPY TECHNIQUE: Informed consent was obtained from the patient prior to the procedure, including potential complications of headache, allergy, and pain. A 'time out' was performed. With the patient prone, the lower back was prepped with Betadine. 1% Lidocaine was used for local anesthesia. Lumbar puncture was performed at the L3-L4 level via a left  interlaminar approach using a 3.5 inch 20 gauge needle with return of clear CSF. 12 mg of methotrexate mix with 50 mg Solu-Cortef was injected into the subarachnoid space slowly. The patient tolerated the procedure well without apparent complication. FLUOROSCOPY TIME:  Radiation Exposure Index (as provided by the fluoroscopic device): 0.5 mGy Fluoroscopy Time:  12 seconds Number of Acquired Images:  0 IMPRESSION: Intrathecal injection of chemotherapy without complication. Electronically Signed   By: Titus Dubin M.D.   On: 11/25/2020 13:32    ASSESSMENT & PLAN:   75 year old wonderful gentleman with history of hypertension, borderline diabetes, dyslipidemia, GERD with recent STEMI on 03/21/2020 and status post four-vessel CABG on 03/25/2020.  1) Left-sided primary testicular large B-cell lymphoma. Staging to be determined. BCL-2, BCL 6, c-Myc negative.  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 CMP stable creatinine 1.7  Patient had a PET CT scan to evaluate response post treatment on 12/03/2020 which showed No findings of active lymphoma. Previous upper normal size thoracic lymph nodes are currently normal in size and activity.Prior left orchiectomy.  -Patient with no significant residual toxicities from his chemotherapy and intrathecal methotrexate at this time. -We discussed that he is currently in remission from his lymphoma.   -Would recommend discussion with radiation oncology regarding consideration of prophylactic radiation to the remaining testicle to reduce the  risk of local recurrence there. -Continue follow-up with cardiology to continue to optimize his management of systolic CHF. -Continue follow-up with primary care physician to continue to optimize treatment of his hypertension diabetes dyslipidemia and other medical issues. 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  FOLLOW UP: RTC with Dr Irene Limbo with portflush and labs in 2 months Referral to radiation oncology for consideration of prophylactic testicular radiation in patient with primary testicular large b cell lymphoma     All of the patients questions were answered with apparent satisfaction. The patient knows to call the clinic with any problems, questions or concerns.  . The total time spent in the appointment was 30 minutes and more than 50% was on counseling and direct patient cares.    Sullivan Lone MD [place] AAHIVMS [facility] Hematology/Oncology [facility]

## 2020-12-15 NOTE — Addendum Note (Signed)
Addended by: Sullivan Lone on: 12/15/2020 01:38 PM   Modules accepted: Orders

## 2020-12-28 ENCOUNTER — Other Ambulatory Visit: Payer: Self-pay | Admitting: Cardiovascular Disease

## 2020-12-29 NOTE — Progress Notes (Signed)
GU Location of Tumor / Histology: Left Testicular large B-cell lymphoma   Past/Anticipated interventions by medical oncology, if any:   Dr. Irene Limbo  -Discussed pt's labwork,  Labs today 12/07/2020 CBC showed hemoglobin of 9.8 with a WBC count of 7.6k and platelets of 260k. CMP stable creatinine 1.7   Patient had a PET CT scan to evaluate response post treatment on 12/03/2020 which showed No findings of active lymphoma. Previous upper normal size thoracic lymph nodes are currently normal in size and activity.Prior left orchiectomy.   -Patient with no significant residual toxicities from his chemotherapy and intrathecal methotrexate at this time. -We discussed that he is currently in remission from his lymphoma.   -Would recommend discussion with radiation oncology regarding consideration of prophylactic radiation to the remaining testicle to reduce the risk of local recurrence there. -Continue follow-up with cardiology to continue to optimize his management of systolic CHF. -Continue follow-up with primary care physician to continue to optimize treatment of his hypertension diabetes dyslipidemia and other medical issues. 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   FOLLOW UP: RTC with Dr Irene Limbo with portflush and labs in 2 months Referral to radiation oncology for consideration of prophylactic testicular radiation in patient with primary testicular large b cell lymphoma    Weight changes, if any: Lost 57 lbs, due to medical issues in Feb-Mar 2022.  IPSS:  4   Bowel/Bladder complaints, if any:  No bowels and bladder issues.  Nausea/Vomiting, if any: No  Pain issues, if any:  0/10 on pain scale  SAFETY ISSUES: Prior radiation?   No Pacemaker/ICD?  No Possible current pregnancy?  Male Is the patient on methotrexate? Received third dose/last on 11/16/2020.  Has port.  Current Complaints / other details:  Need more  information on treatment options.

## 2020-12-30 DIAGNOSIS — D509 Iron deficiency anemia, unspecified: Secondary | ICD-10-CM | POA: Insufficient documentation

## 2020-12-30 DIAGNOSIS — E1159 Type 2 diabetes mellitus with other circulatory complications: Secondary | ICD-10-CM | POA: Insufficient documentation

## 2020-12-30 DIAGNOSIS — E7800 Pure hypercholesterolemia, unspecified: Secondary | ICD-10-CM | POA: Insufficient documentation

## 2020-12-30 DIAGNOSIS — E1169 Type 2 diabetes mellitus with other specified complication: Secondary | ICD-10-CM | POA: Insufficient documentation

## 2020-12-30 DIAGNOSIS — I5189 Other ill-defined heart diseases: Secondary | ICD-10-CM | POA: Insufficient documentation

## 2020-12-31 ENCOUNTER — Ambulatory Visit
Admission: RE | Admit: 2020-12-31 | Discharge: 2020-12-31 | Disposition: A | Discharge status: Home / Self Care | Payer: Medicare HMO | Source: Ambulatory Visit | Attending: Radiation Oncology | Admitting: Radiation Oncology

## 2020-12-31 DIAGNOSIS — C8339 Diffuse large B-cell lymphoma, extranodal and solid organ sites: Secondary | ICD-10-CM | POA: Diagnosis not present

## 2020-12-31 DIAGNOSIS — C6212 Malignant neoplasm of descended left testis: Secondary | ICD-10-CM

## 2020-12-31 DIAGNOSIS — C83398 Diffuse large b-cell lymphoma of other extranodal and solid organ sites: Secondary | ICD-10-CM | POA: Insufficient documentation

## 2020-12-31 NOTE — Progress Notes (Signed)
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    ICD-10-CM   1. Malignant neoplasm of descended left testis (HCC)  C62.12     2. 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 male seen at the request of Dr. Irene Limbo.  He initially presented with a left testicular swelling around the end of September/early October 2021 and was evaluated in urology by Dr. Lovena Neighbours.  He underwent a left transinguinal orchiectomy on 03/11/2020 with pathology confirming a primary testicular large B-cell lymphoma.  He was referred to Dr. Irene Limbo in medical oncology in April 2022 and subsequently completed 6 cycles of R-CEOP chemotherapy and intrathecal methotrexate injections x3.  His systemic therapy was completed on 11/25/2020 and a repeat PET scan on 12/03/2020 was without any evidence of active lymphoma.  He has been referred to [place] today to discuss prophylactic radiation to the right testicle to help reduce the risk of local recurrence.  PREVIOUS RADIATION THERAPY: No  PAST MEDICAL HISTORY:  Past Medical History:  Diagnosis Date   Allergic rhinitis    Arthritis    wrists   Cardiomyopathy, nonischemic ([place])    followed by cardiology--- dr [name]---  2016 ef 45-50% ,  2016 Roy ef 37%,  2017 per echo ef 50-55%   CHF (congestive heart failure), NYHA class III ([place]) 03/21/2020   Coronary artery disease    GERD (gastroesophageal reflux disease)    Hiatal  hernia    History of kidney stones    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;   01/ 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   History of syncope (03-06-2020 pt stated has not had sycopal episode in few years, stated it seems to happen in extreme hot conditions)   cardiologist--- dr [name]--- dx recurrent syncope;  Roy study 11-03-2014 intermediate risk w/ no ischemia, apical hypokinesis, Roy ef 37%;  event monitor-- 12-21-2015 SB/ ST  no pauses/ arrythmia's;  echo 08-03-2015 ef 50-55%   Hyperlipidemia    Hypertension    followed by pcp   Hypovitaminosis D    Mass of left testicle    Nocturia    Plantar fasciitis    Presence of surgical incision    01/ 2022  moh's w/ skin graft of nose, per pt still healing and wear bandage daily   Type 2 diabetes mellitus ([place])    pt is adament that he is not and have been told he is a diabetic but a borderline;  followed by pcp, in pcp note states DM2 and takes 2 meds daily   Wears glasses    Wears hearing aid in both ears    Weight loss 11/16/2020      PAST SURGICAL HISTORY: Past Surgical History:  Procedure Laterality Date   COLONOSCOPY  last one 01-30-2017   CORONARY ARTERY BYPASS GRAFT N/A 03/25/2020   Procedure: CORONARY ARTERY BYPASS GRAFTING (CABG), ON PUMP, TIMES FOUR,  USING BILATERAL INTERNAL MAMMARY ARTERIES AND LEFT RADIAL ARTERY;  Surgeon: [name], [name];  Location: [facility] OR;  Service: Open Heart Surgery;  Laterality: N/A;   IR IMAGING GUIDED PORT INSERTION  07/06/2020   LOW ANTERIOR RESECTION RECTUM W/ COLOPROCTOSTOMY  05/2003   MOHS SURGERY  01/2020   nose w/ graft   ORCHIECTOMY Left 03/11/2020   Procedure: [place] Link;  Surgeon: [name], [name];  Location[facility];  Service: Urology;  Laterality: Left;  ONLY NEEDS 60 MIN   RADIAL ARTERY HARVEST Left 03/25/2020   Procedure: LEFT RADIAL ARTERY HARVEST;  Surgeon: [name], [name];  Location: [place];  Service: Open Heart Surgery;  Laterality: Left;   RIGHT/LEFT HEART CATH AND CORONARY ANGIOGRAPHY N/A 03/23/2020   Procedure: RIGHT/LEFT HEART CATH AND CORONARY ANGIOGRAPHY;  Surgeon: [name], [name];  Location: [place] [facility];  Service: Cardiovascular;  Laterality: N/A;   SHOULDER SURGERY Right 1992; 07/ 2021   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 Left 12/24/2008   left ear and nose    TEE WITHOUT CARDIOVERSION N/A 03/25/2020   Procedure: TRANSESOPHAGEAL ECHOCARDIOGRAM (TEE);  Surgeon: [name], [name];  Location: [place];  Service: Open Heart Surgery;  Laterality: N/A;   TONSILLECTOMY AND ADENOIDECTOMY  child   UPPER GASTROINTESTINAL ENDOSCOPY  last one 06-06-2017    FAMILY HISTORY:  Family History  Problem Relation Age of Onset   Heart attack Mother    Stroke Father    Colon cancer Neg Hx    Esophageal cancer Neg Hx    Pancreatic cancer Neg Hx    Rectal cancer Neg Hx    Stomach cancer Neg Hx    Colon polyps Neg Hx     SOCIAL HISTORY:  Social History   Socioeconomic History   Marital status: Married    Spouse name: Not on file   Number of children: Not on file   Years of education: Not on file   Highest education level: Not on file  Occupational History   Not on file  Tobacco Use   Smoking status: Former    Types: Pipe    Quit date: 11/29/1976    Years since quitting: 44.1   Smokeless tobacco: Never  Vaping Use   Vaping Use: Never used  Substance and Sexual Activity   Alcohol use: Yes    Alcohol/week: 0.0 standard drinks    Comment: Occasional drink    Drug use: Never   Sexual activity: Not on file  Other Topics Concern   Not on file  Social History Narrative   Not on file   Social Determinants of Health   Financial Resource Strain: Not on file  Food Insecurity: Not on file  Transportation Needs: Not on file  Physical Activity: Not on file  Stress: Not on file  Social Connections: Not on file  Intimate Partner Violence: Not  on file    ALLERGIES: Patient has no known allergies.  MEDICATIONS:  Current Outpatient Medications  Medication Sig Dispense Refill   Lancets 33G MISC See admin instructions.     ACCU-CHEK GUIDE test strip      Acetaminophen (ACETAMINOPHEN EXTRA STRENGTH) 500 MG capsule 1 capsule as needed     acetaminophen (TYLENOL) 325 MG tablet Take 2 tablets (650 mg total) by mouth every 4 (four) hours as needed for headache or mild pain.     Apoaequorin (PREVAGEN PO) Take 1 tablet by mouth at bedtime.  aspirin EC 81 MG tablet Take 81 mg by mouth daily. Swallow whole.     atorvastatin (LIPITOR) 40 MG tablet Take 1 tablet (40 mg total) by mouth daily. 90 tablet 3   atorvastatin (LIPITOR) 40 MG tablet 1 tablet     B-Complex TABS See admin instructions.     Blood Glucose Monitoring Suppl (ACCU-CHEK GUIDE ME) w/Device KIT      cholecalciferol (VITAMIN D) 1000 UNITS tablet Take 2,000 Units by mouth See admin instructions. Mon-friday     COVID-19 mRNA bivalent vaccine, Pfizer, (PFIZER COVID-19 VAC BIVALENT) injection Inject into the muscle. 0.3 mL 0   digoxin (LANOXIN) 0.125 MG tablet Take 1 tablet (0.125 mg total) by mouth daily. 90 tablet 3   empagliflozin (JARDIANCE) 10 MG TABS tablet Take 1 tablet (10 mg total) by mouth daily before breakfast. 90 tablet 3   ENTRESTO 24-26 MG TAKE 1 TABLET BY MOUTH 2 TIMES DAILY. 60 tablet 3   FLUZONE HIGH-DOSE QUADRIVALENT 0.7 ML SUSY      glucosamine-chondroitin 500-400 MG tablet Take 1 tablet by mouth. 5 times weekly     LORazepam (ATIVAN) 0.5 MG tablet Take 1 tablet (0.5 mg total) by mouth every 6 (six) hours as needed for anxiety. Put 1 pill under your tongue every 6 hrs, if needed, for nausea 30 tablet 0   LORazepam (ATIVAN) 0.5 MG tablet 1 tablet at bedtime as needed     metFORMIN (GLUCOPHAGE) 1000 MG tablet Take 1,000 mg by mouth 2 (two) times daily with a meal.      metoprolol (TOPROL XL) 200 MG 24 hr tablet Take 1 tablet (200 mg total) by mouth daily. 90  tablet 3   Metoprolol Succinate 200 MG CS24 See admin instructions.     Misc Natural Products (GLUCOSAMINE CHOND COMPLEX/MSM) TABS See admin instructions.     Multiple Vitamin (MULTIVITAMIN WITH MINERALS) TABS tablet Take 1 tablet by mouth. 5 times weekly     omeprazole (PRILOSEC) 20 MG capsule Take 20 mg by mouth daily.     omeprazole (PRILOSEC) 20 MG capsule 1 capsule     ondansetron (ZOFRAN) 8 MG tablet Take 1 tablet (8 mg total) by mouth every 8 (eight) hours as needed for nausea or vomiting. Take 1 pill every 8 hrs for 3 days. 30 tablet 3   ondansetron (ZOFRAN-ODT) 8 MG disintegrating tablet 1 tablet on the tongue and allow to dissolve  as needed     potassium chloride SA (KLOR-CON M20) 20 MEQ tablet Take 1 tablet (20 mEq total) by mouth daily. 90 tablet 3   prochlorperazine (COMPAZINE) 10 MG tablet Take 1 tablet (10 mg total) by mouth every 8 (eight) hours as needed for nausea or vomiting. 40 tablet 2   prochlorperazine (COMPAZINE) 10 MG tablet See admin instructions.     spironolactone (ALDACTONE) 25 MG tablet TAKE 1/2 TABLET BY MOUTH DAILY. 135 tablet 1   tamsulosin (FLOMAX) 0.4 MG CAPS capsule Take 0.4 mg by mouth at bedtime.     torsemide (DEMADEX) 20 MG tablet Take 2 tablets (40 mg total) by mouth daily. 180 tablet 3   vitamin B-12 (CYANOCOBALAMIN) 1000 MCG tablet Take 1,000 mcg by mouth. 5 times a week     Current Facility-Administered Medications  Medication Dose Route Frequency Provider Last Rate Last Admin   potassium chloride (KLOR-CON) CR tablet 20 mEq  20 mEq Oral BID [name], [name]        REVIEW OF SYSTEMS:  On review of systems,  the patient reports that he is doing well overall.  He denies any chest pain, shortness of breath, cough, fevers, chills, night sweats, unintended weight changes.  He denies any bowel or bladder disturbances, and denies abdominal pain, nausea or vomiting.  He denies any new musculoskeletal or joint aches or pains. A complete review of systems  is obtained and is otherwise negative.     PHYSICAL EXAM:  Wt Readings from Last 3 Encounters:  12/07/20 174 lb 14.4 oz (79.3 kg)  11/25/20 177 lb (80.3 kg)  11/09/20 181 lb 3.2 oz (82.2 kg)   Temp Readings from Last 3 Encounters:  12/07/20 (!) 97.3 F (36.3 C)  11/25/20 98.2 F (36.8 C) (Oral)  11/16/20 97.7 F (36.5 C) (Oral)   BP Readings from Last 3 Encounters:  12/07/20 96/62  11/25/20 (!) 98/53  11/16/20 101/63   Pulse Readings from Last 3 Encounters:  12/07/20 96  11/25/20 93  11/16/20 87   Pain Assessment Pain Score: 0-No pain/10  Unable to assess due to telephone consult visit format.   KPS = 90  100 - Normal; no complaints; no evidence of disease. 90   - Able to carry on normal activity; minor signs or symptoms of disease. 80   - Normal activity with effort; some signs or symptoms of disease. 55   - Cares for self; unable to carry on normal activity or to do active work. 60   - Requires occasional assistance, but is able to care for most of his personal needs. 50   - Requires considerable assistance and frequent medical care. 17   - Disabled; requires special care and assistance. 90   - Severely disabled; hospital admission is indicated although death not imminent. 13   - Very sick; hospital admission necessary; active supportive treatment necessary. 10   - Moribund; fatal processes progressing rapidly. 0     - Dead  Karnofsky DA, Abelmann Blair, Craver LS and Burchenal [facility] [phone]) The use of the nitrogen mustards in the palliative treatment of carcinoma: with particular reference to bronchogenic carcinoma Cancer 1 634-56  LABORATORY DATA:  Lab Results  Component Value Date   WBC 7.6 12/07/2020   HGB 9.8 (L) 12/07/2020   HCT 30.9 (L) 12/07/2020   MCV 90.6 12/07/2020   PLT 260 12/07/2020   Lab Results  Component Value Date   NA 141 12/07/2020   K 4.1 12/07/2020   CL 107 12/07/2020   CO2 21 (L) 12/07/2020   Lab Results  Component Value Date   ALT  15 12/07/2020   AST 16 12/07/2020   ALKPHOS 73 12/07/2020   BILITOT 0.7 12/07/2020     RADIOGRAPHY: NM PET Image Restag (PS) Skull Base To [name]  Result Date: 12/04/2020 CLINICAL DATA:  Subsequent treatment strategy for testicular large B-cell lymphoma, prior orchiectomy in February 2022. Status post 6 cycles of chemotherapy completed in October 2022. EXAM: Roy [name] TECHNIQUE: 8.5 mCi F-18 FDG was injected intravenously. Full-ring PET imaging was performed from the skull base to [name] after the radiotracer. CT data was obtained and used for attenuation correction and anatomic localization. Fasting blood glucose: 149 mg/dl COMPARISON:  Multiple exams, including 05/11/2020 FINDINGS: Mediastinal blood pool activity: SUV max 2.6 Liver activity: SUV max 3.7 NECK: No significant abnormal hypermetabolic activity in this region. Incidental CT findings: Mild left common carotid atherosclerotic calcification. Small mucous retention cyst in the right maxillary sinus. CHEST: 0.5 cm right paratracheal lymph node on image 64  series 4 has maximum SUV of 1.7, Deauville 2 (previously measuring 1.0 cm in diameter with maximum SUV 2.7). Subcarinal lymph node 0.6 cm in short axis maximum SUV 2.0 (Deauville 2), formerly 1.1 cm with maximum SUV of 2.4. Incidental CT findings: Right Port-A-Cath tip: SVC. Coronary, aortic arch, and branch vessel atherosclerotic vascular disease. Prior CABG. Small type 1 hiatal hernia. Borderline cardiomegaly. ABDOMEN/PELVIS: Scattered activity in bowel without associated abnormality on the CT data attributed to physiologic activity. Incidental CT findings: Cholelithiasis. Atherosclerosis is present, including aortoiliac atherosclerotic disease. Postoperative findings in the sigmoid colon. Punctate nonobstructive renal calculi. Left orchiectomy. SKELETON: No significant abnormal hypermetabolic activity in this region. Incidental CT findings: Pre acromial os acromiale  on the right. IMPRESSION: 1. No findings of active lymphoma. Previous upper normal size thoracic lymph nodes are currently normal in size and activity. 2. Prior left orchiectomy. 3. Other imaging findings of potential clinical significance: Aortic Atherosclerosis (ICD10-I70.0). Coronary atherosclerosis and prior CABG. Small type 1 hiatal hernia. Borderline cardiomegaly. Cholelithiasis. Punctate bilateral nonobstructive renal calculi. Mild chronic right maxillary sinusitis. Electronically Signed   By: Van Clines M.D.   On: 12/04/2020 07:46      IMPRESSION/PLAN:  This visit was conducted via telephone to spare the patient unnecessary potential exposure in the healthcare setting during the current COVID-19 pandemic.  1. 75 y.o. 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  Today, we talked to the patient and his wife, Roy [name], about the findings and workup thus far. We discussed the natural history of testicular large B-cell lymphoma and general treatment, highlighting the role of radiotherapy in the management. We discussed the available radiation techniques, and focused on the details and logistics of delivery. The recommendation is for a 3-1/2-week course of daily prophylactic radiotherapy to the right testicle in an effort to reduce the risk of local recurrence.  We reviewed the anticipated acute and late sequelae associated with radiation in this setting. The patient was encouraged to ask questions that were answered to his stated satisfaction.  At the conclusion of our conversation, the patient remains undecided but is heavily leaning towards proceeding with the prophylactic radiotherapy to the right testicle, likely in January or February 2023.  He and his wife are getting give some further consideration to our discussion today and he also would like to have further conversation with his PCP, Dr. Theda Sers, as he  highly values her opinion.  He has our contact information and will let us know if/when he is ready to proceed and we will move forward with treatment planning at that time.  We will share our discussion with Dr. Irene Limbo and Dr. Theda Sers and look forward to continuing to participate in his care.  He knows that he is welcome to call at anytime with any questions or concerns in the interim.  Given current concerns for patient exposure during the COVID-19 pandemic, this encounter was conducted via telephone. The patient was notified in advance and was offered a [place] meeting to allow for face to face communication but unfortunately reported that he/she did not have the appropriate resources/technology to support such a visit and instead preferred to proceed with telephone consult. The patient has given verbal consent for this type of encounter. The attendants for this meeting include Roy [name], Roy [name], patient, Roy [name] and his wife, Roy [name]. During the encounter, Roy [name] and Andi Devon, were located at [facility]  [facility].  Patient, Cortland Crehan and his wife, Roy [name], were located at home.   We personally spent 70 minutes in this encounter including chart review, reviewing radiological studies, meeting face-to-face with the patient, entering orders and completing documentation.    Roy Johns, [name]    Roy Pita, [name]  [facility] Direct Dial: [phone]  Fax: [phone] [email or web address]  Skype  [email or web address]

## 2021-01-01 ENCOUNTER — Other Ambulatory Visit: Payer: Self-pay | Admitting: Otolaryngology

## 2021-01-01 DIAGNOSIS — H903 Sensorineural hearing loss, bilateral: Secondary | ICD-10-CM | POA: Diagnosis not present

## 2021-01-01 DIAGNOSIS — H9041 Sensorineural hearing loss, unilateral, right ear, with unrestricted hearing on the contralateral side: Secondary | ICD-10-CM

## 2021-01-01 DIAGNOSIS — H6981 Other specified disorders of Eustachian tube, right ear: Secondary | ICD-10-CM | POA: Diagnosis not present

## 2021-01-14 ENCOUNTER — Ambulatory Visit
Admission: RE | Admit: 2021-01-14 | Discharge: 2021-01-14 | Disposition: A | Discharge status: Home / Self Care | Payer: Medicare HMO | Source: Ambulatory Visit | Attending: Otolaryngology | Admitting: Otolaryngology

## 2021-01-14 DIAGNOSIS — J341 Cyst and mucocele of nose and nasal sinus: Secondary | ICD-10-CM | POA: Diagnosis not present

## 2021-01-14 DIAGNOSIS — H9041 Sensorineural hearing loss, unilateral, right ear, with unrestricted hearing on the contralateral side: Secondary | ICD-10-CM | POA: Diagnosis not present

## 2021-01-14 DIAGNOSIS — I6782 Cerebral ischemia: Secondary | ICD-10-CM | POA: Diagnosis not present

## 2021-01-14 DIAGNOSIS — J329 Chronic sinusitis, unspecified: Secondary | ICD-10-CM | POA: Diagnosis not present

## 2021-01-14 IMAGING — MR MR BRAIN/TEMPORAL BONE/IAC
10 of 14 series · 26 of 48 positions shown · IV contrast (gadavist)
Comparison: Head CT [DATE].

CLINICAL DATA: Provided history: Sensorineural hearing loss,
unilateral, right ear, with unrestricted hearing on the
contralateral side. Additional history provided by scanning
technologist: Patient reports worsening right sided hearing loss for
10 months, history of lymphoma.

EXAM:
MRI HEAD WITHOUT AND WITH CONTRAST
TECHNIQUE: Multiplanar, multiecho pulse sequences of the brain and surrounding
structures were obtained without and with intravenous contrast.
CONTRAST:  7.5mL GADAVIST GADOBUTROL 1 MMOL/ML IV SOLN

[Series 5: T1 · sagittal · 5.0mm · 0.62mm/px · 3 of 23 slices shown (1 of 3)]
[im 1/23]
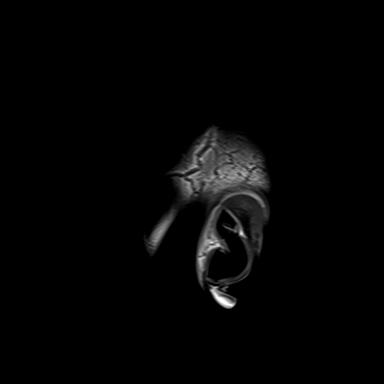
[im 12/23]
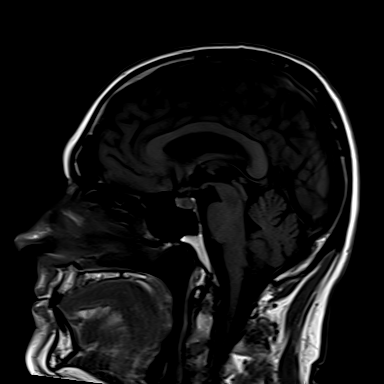
[im 23/23]
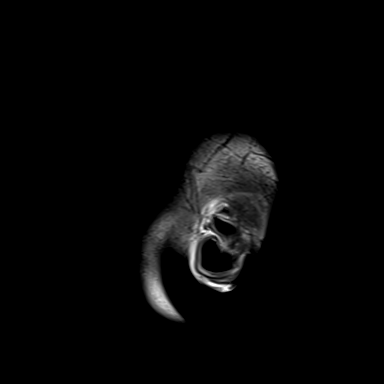

[Series 6: ax dwi_tracew · axial · 3.0mm · 0.60mm/px · z∈[-68,+87]mm · 3 of 48 slices shown]
[im 1/48]
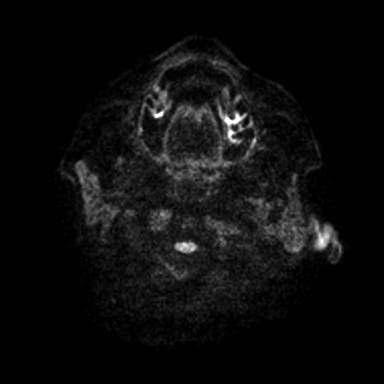
[im 24/48]
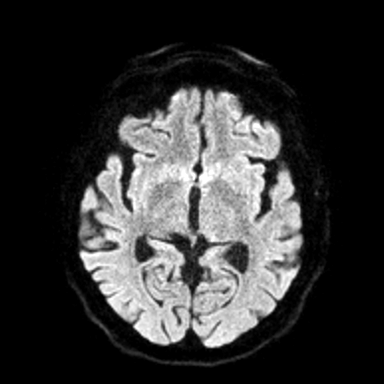
[im 48/48]
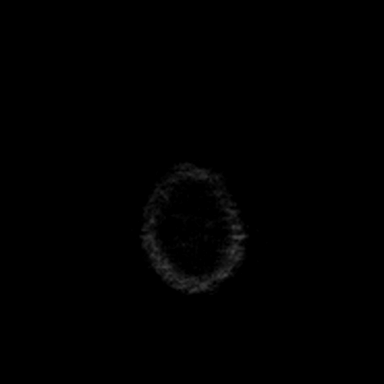

[Series 7: ax dwi_adc · axial · 3.0mm · 0.60mm/px · 1 of 48 slices shown]
[im 1/48]
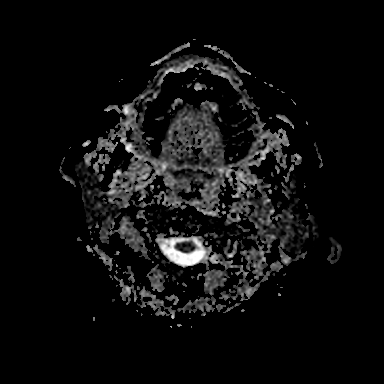

[Series 8: T2 · axial · 5.0mm · 0.53mm/px · z∈[-63,+81]mm · 2 of 25 slices shown]
[im 1/25]
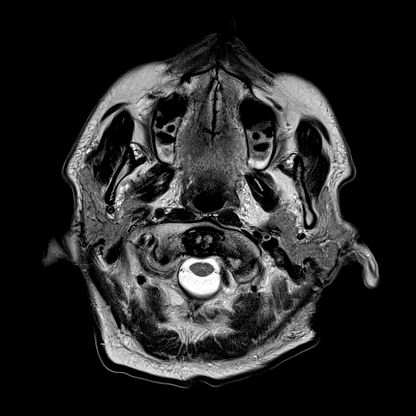
[im 25/25]
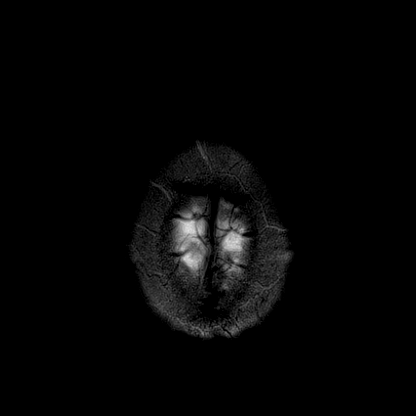

[Series 13: FLAIR · axial · 3.0mm · 0.53mm/px · z∈[-72,+90]mm · 4 of 55 slices shown]
[im 1/55]
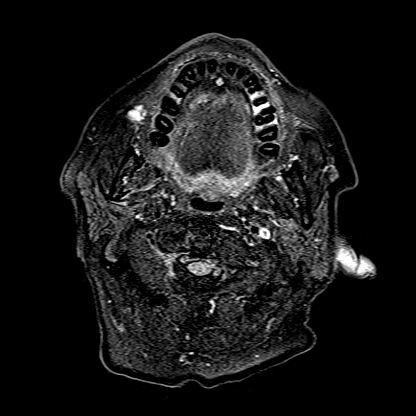
[im 19/55]
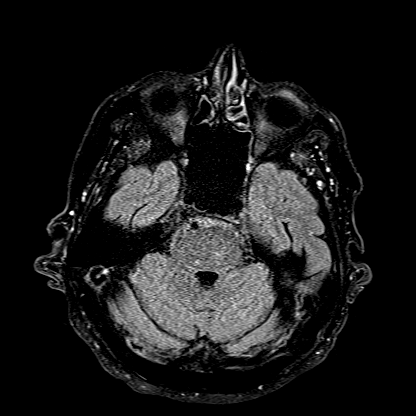
[im 37/55]
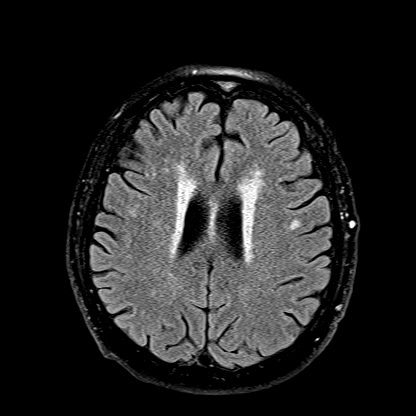
[im 55/55]
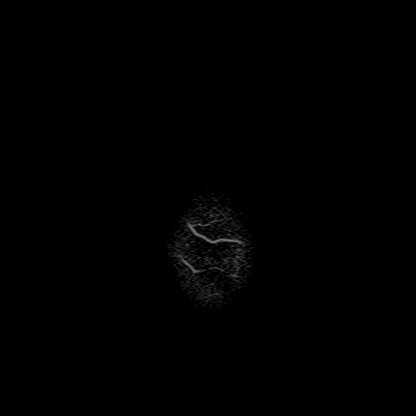

[Series 14: T1 · coronal · non-contrast · 3.0mm · 0.21mm/px · 1 of 13 slices shown (2 of 3)]
[im 1/13]
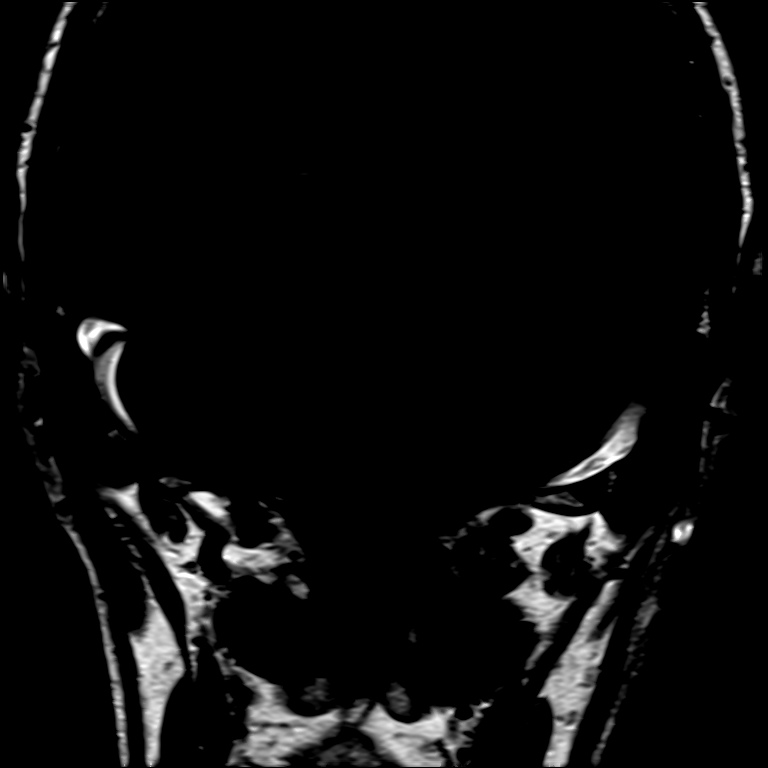

[Series 16: T1 · axial · non-contrast · 3.0mm · 0.21mm/px · 1 of 11 slices shown (3 of 3)]
[im 1/11]
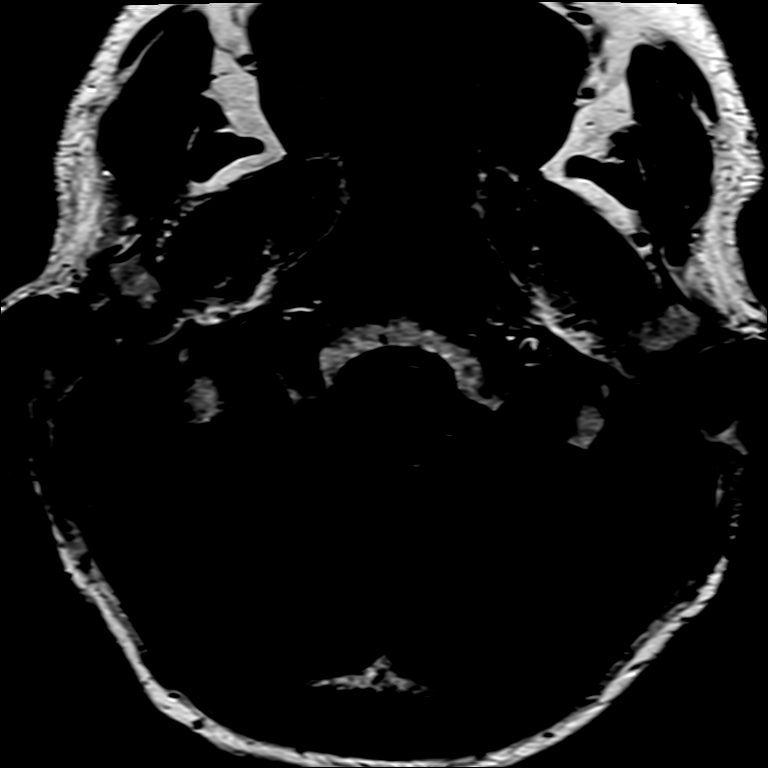

[Series 17: T1 post-contrast · axial · 3.0mm · 0.21mm/px · 1 of 11 slices shown (1 of 3)]
[im 1/11]
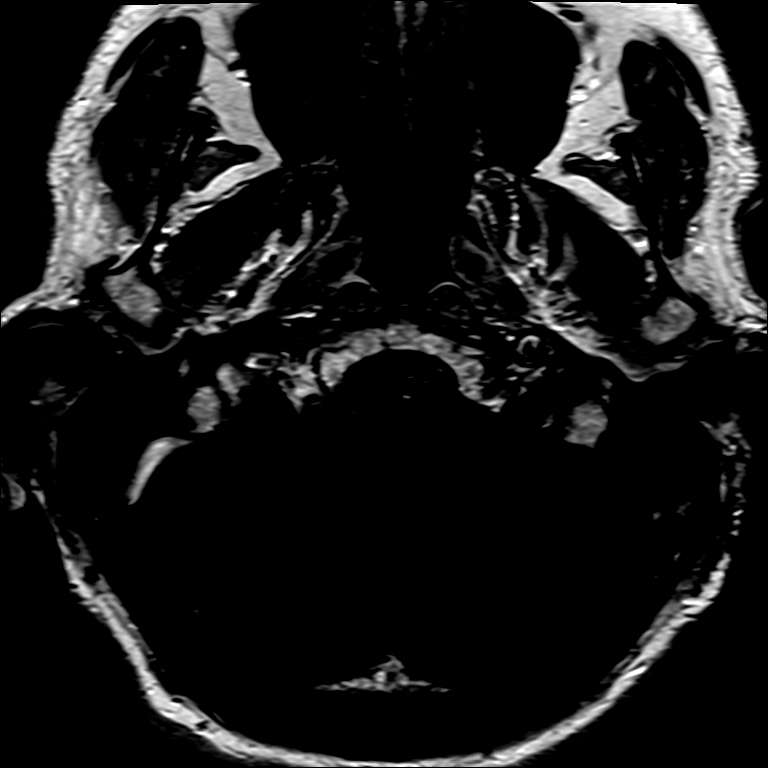

[Series 18: T1 post-contrast · coronal · 3.0mm · 0.21mm/px · 1 of 13 slices shown (2 of 3)]
[im 1/13]
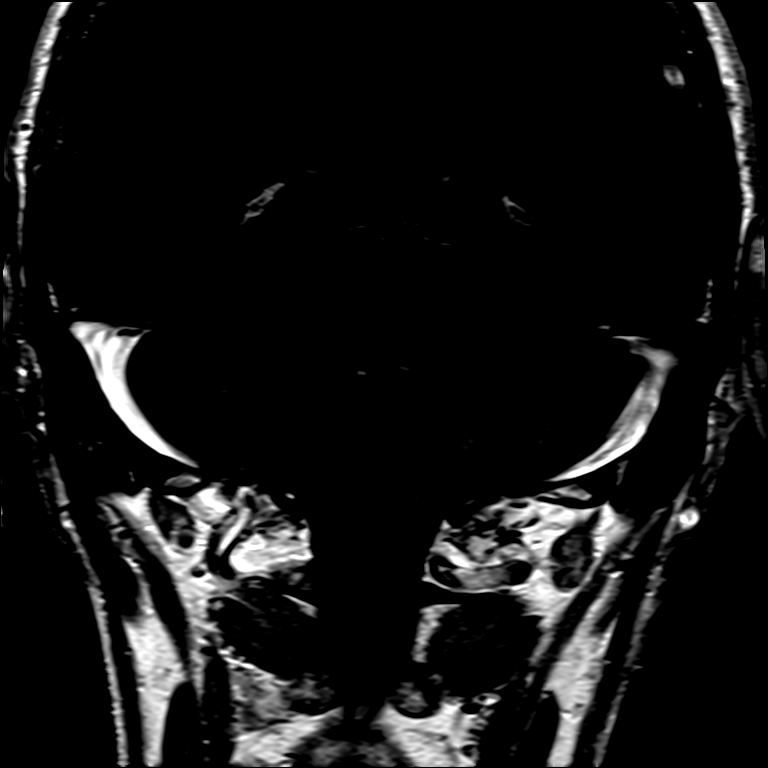

[Series 19: T1 post-contrast · axial · 1.0mm · 0.98mm/px · z∈[-77,+97]mm · 9 of 176 slices shown (3 of 3)]
[im 1/176]
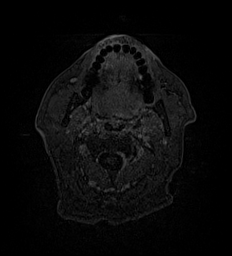
[im 30/176]
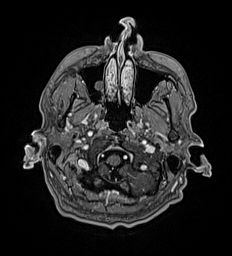
[im 59/176]
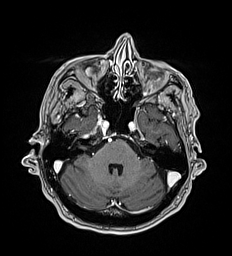
[im 73/176]
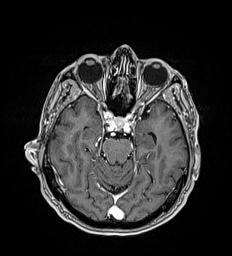
[im 88/176]
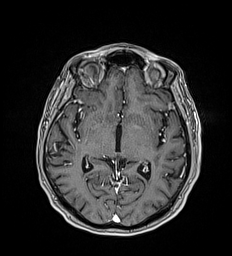
[im 103/176]
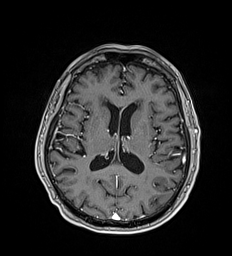
[im 117/176]
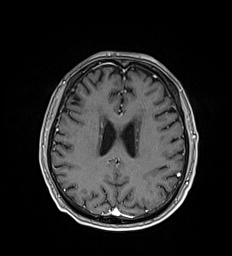
[im 146/176]
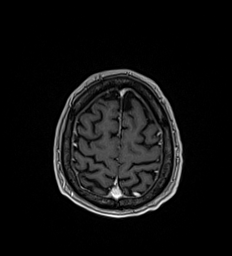
[im 176/176]
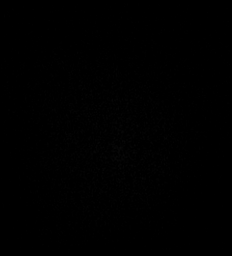

[26 of 48 positions shown; findings below may reference images not displayed]

FINDINGS: Brain:

Cerebral volume appears normal for age.

Mild-to-moderate multifocal T2 FLAIR hyperintense signal abnormality
within the cerebral white matter, nonspecific but compatible with
chronic small vessel ischemic disease.

No evidence of an intracranial mass. Specifically, no
cerebellopontine angle or internal auditory canal mass is
demonstrated. Unremarkable appearance of the 7th and 8th cranial
nerves bilaterally.

There is no acute infarct.

No chronic intracranial blood products.

No extra-axial fluid collection.

No midline shift.

No pathologic intracranial enhancement identified.

Vascular: Maintained flow voids within the proximal large arterial
vessels.

Skull and upper cervical spine: No focal suspicious marrow lesion.

Sinuses/Orbits: Visualized orbits show no acute finding. Small
mucous retention cysts within the right maxillary sinus and within a
posterior left ethmoid air cell. Trace scattered mucosal thickening
elsewhere within the ethmoid sinuses.
IMPRESSION: No evidence of acute intracranial abnormality.

No cerebellopontine angle or internal auditory canal mass.

Mild-to-moderate chronic small vessel ischemic changes within the
cerebral white matter.

Paranasal sinus disease, as described.

## 2021-01-14 MED ORDER — GADOBUTROL 1 MMOL/ML IV SOLN
7.5000 mL | Freq: Once | INTRAVENOUS | Status: AC | PRN
  Administered 2021-01-14: 15:00:00 7.5 mL via INTRAVENOUS

## 2021-01-22 DIAGNOSIS — E1159 Type 2 diabetes mellitus with other circulatory complications: Secondary | ICD-10-CM | POA: Diagnosis not present

## 2021-01-22 DIAGNOSIS — E7800 Pure hypercholesterolemia, unspecified: Secondary | ICD-10-CM | POA: Diagnosis not present

## 2021-01-22 DIAGNOSIS — I5189 Other ill-defined heart diseases: Secondary | ICD-10-CM | POA: Diagnosis not present

## 2021-01-22 DIAGNOSIS — I10 Essential (primary) hypertension: Secondary | ICD-10-CM | POA: Diagnosis not present

## 2021-02-01 ENCOUNTER — Telehealth: Payer: Self-pay | Admitting: *Deleted

## 2021-02-01 NOTE — Telephone Encounter (Signed)
RETURNED PATIENT'S PHONE CALL, SPOKE WITH PATIENT. ?

## 2021-02-02 ENCOUNTER — Telehealth: Payer: Self-pay | Admitting: *Deleted

## 2021-02-02 NOTE — Telephone Encounter (Signed)
Called patient to inform of sim appt. on 02-05-21- arrival time- 2:45 pm, spoke with patient and he is aware of this appt.

## 2021-02-02 NOTE — Telephone Encounter (Signed)
Called patient to see if he could come on Jan. 24 for sim, patient is wanting to come on Jan. 13, I informed the patient that I have called sim and lvm and as soon as I hear from them  I'll call him back, patient verified understanding this

## 2021-02-03 NOTE — Progress Notes (Signed)
Cardiology Office Note:   Date:  02/04/2021  NAME:  Roy [name]    MRN: [id number] DOB:  1945-04-27   PCP:  [name], DO  Cardiologist:  [name], MD  Electrophysiologist:  None   Referring MD: [name], DO   Chief Complaint  Patient presents with   Follow-up    3 months.    History of Present Illness:   Roy [name] is a 76 y.o. male with a hx of systolic heart failure, CAD status post CABG, a primary testicular lymphoma status post chemo (in remission), diabetes, CKD stage III who presents for follow-up.  He reports he is doing well.  Weight is stable around 180 pounds.  Volume status is excellent today.  BP 106/64.  He does report some dizziness upon standing and position change.  His EF is 20-25%.  On digoxin and appropriate GDMT.  Since he is now in remission he can be considered for ICD.  I think is a good idea for him to see advanced heart failure as well.  He is walking up to 30 minutes/day.  No chest pain or significant shortness of breath.  He does take breaks which is expected.  Overall watching his salt.  Doing quite well.   Problem List 1. 3vCAD -CABG x 4 03/25/2020 (LIMA-LAD, RIMA-PDA, radial artery OM1/2) 2. Systolic HF, ischemic -07/3708: 25-30% -4//2022: 20-25% -10/2020: 20-25% 3. DM -A1c 6.2 4. HLD -T chol 140, HDL 36, LDL 74, TG 148 5. CKD III 6. 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  Past Medical History: Past Medical History:  Diagnosis Date   Allergic rhinitis    Arthritis    wrists   Cardiomyopathy, nonischemic ([place])    followed by cardiology--- dr [name]---  2016 ef 45-50% ,  2016 nuclear ef 37%,  2017 per echo ef 50-55%   CHF (congestive heart failure), NYHA class III ([place]) 03/21/2020   Coronary artery disease    GERD (gastroesophageal reflux disease)    Hiatal hernia    History of kidney stones    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;   01/ 2022 moh's surgery w/ skin  graft of nose   History of syncope (03-06-2020 pt stated has not had sycopal episode in few years, stated it seems to happen in extreme hot conditions)   cardiologist--- dr [name]--- dx recurrent syncope;  nuclear study 11-03-2014 intermediate risk w/ no ischemia, apical hypokinesis, nuclear ef 37%;  event monitor-- 12-21-2015 SB/ ST  no pauses/ arrythmia's;  echo 08-03-2015 ef 50-55%   Hyperlipidemia    Hypertension    followed by pcp   Hypovitaminosis D    Mass of left testicle    Nocturia    Plantar fasciitis    Presence of surgical incision    01/ 2022  moh's w/ skin graft of nose, per pt still healing and wear bandage daily   Type 2 diabetes mellitus ([place])    pt is adament that he is not and have been told he is a diabetic but a borderline;  followed by pcp, in pcp note states DM2 and takes 2 meds daily   Wears glasses    Wears hearing aid in both ears    Weight loss 11/16/2020    Past Surgical History: Past Surgical History:  Procedure Laterality Date   COLONOSCOPY  last one 01-30-2017   CORONARY ARTERY BYPASS GRAFT N/A 03/25/2020   Procedure:  CORONARY ARTERY BYPASS GRAFTING (CABG), ON PUMP, TIMES FOUR, USING BILATERAL INTERNAL MAMMARY ARTERIES AND LEFT RADIAL ARTERY;  Surgeon: [name], MD;  Location: [facility];  Service: Open Heart Surgery;  Laterality: N/A;   IR IMAGING GUIDED PORT INSERTION  07/06/2020   LOW ANTERIOR RESECTION RECTUM W/ COLOPROCTOSTOMY  05/2003   MOHS SURGERY  01/2020   nose w/ graft   ORCHIECTOMY Left 03/11/2020   Procedure: [place];  Surgeon: [name], MD;  Location: [facility];  Service: Urology;  Laterality: Left;  ONLY NEEDS 60 MIN   RADIAL ARTERY HARVEST Left 03/25/2020   Procedure: LEFT RADIAL ARTERY HARVEST;  Surgeon: [name], MD;  Location: [place];  Service: Open Heart Surgery;  Laterality: Left;   RIGHT/LEFT HEART CATH AND CORONARY ANGIOGRAPHY N/A 03/23/2020   Procedure: RIGHT/LEFT HEART  CATH AND CORONARY ANGIOGRAPHY;  Surgeon: [name], MD;  Location: [facility];  Service: Cardiovascular;  Laterality: N/A;   SHOULDER SURGERY Right 1992; 07/ 2021   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 Left 12/24/2008   left ear and nose    TEE WITHOUT CARDIOVERSION N/A 03/25/2020   Procedure: TRANSESOPHAGEAL ECHOCARDIOGRAM (TEE);  Surgeon: [name], MD;  Location: [place];  Service: Open Heart Surgery;  Laterality: N/A;   TONSILLECTOMY AND ADENOIDECTOMY  child   UPPER GASTROINTESTINAL ENDOSCOPY  last one 06-06-2017    Current Medications: Current Meds  Medication Sig   ACCU-CHEK GUIDE test strip    Acetaminophen (ACETAMINOPHEN EXTRA STRENGTH) 500 MG capsule 1 capsule as needed   acetaminophen (TYLENOL) 325 MG tablet Take 2 tablets (650 mg total) by mouth every 4 (four) hours as needed for headache or mild pain.   Apoaequorin (PREVAGEN PO) Take 1 tablet by mouth at bedtime.   aspirin EC 81 MG tablet Take 81 mg by mouth daily. Swallow whole.   atorvastatin (LIPITOR) 40 MG tablet 1 tablet   B-Complex TABS See admin instructions.   Blood Glucose Monitoring Suppl (ACCU-CHEK GUIDE ME) w/Device KIT    cholecalciferol (VITAMIN D) 1000 UNITS tablet Take 2,000 Units by mouth See admin instructions. Mon-friday   COVID-19 mRNA bivalent vaccine, Pfizer, (PFIZER COVID-19 VAC BIVALENT) injection Inject into the muscle.   digoxin (LANOXIN) 0.125 MG tablet Take 1 tablet (0.125 mg total) by mouth daily.   empagliflozin (JARDIANCE) 10 MG TABS tablet Take 1 tablet (10 mg total) by mouth daily before breakfast.   ENTRESTO 24-26 MG TAKE 1 TABLET BY MOUTH 2 TIMES DAILY.   FLUZONE HIGH-DOSE QUADRIVALENT 0.7 ML SUSY    glucosamine-chondroitin 500-400 MG tablet Take 1 tablet by mouth. 5 times weekly   Lancets 33G MISC See admin instructions.   LORazepam (ATIVAN) 0.5 MG tablet Take 1 tablet (0.5 mg total) by mouth every 6 (six) hours as needed for anxiety. Put 1 pill under  your tongue every 6 hrs, if needed, for nausea   LORazepam (ATIVAN) 0.5 MG tablet 1 tablet at bedtime as needed   metFORMIN (GLUCOPHAGE) 1000 MG tablet Take 1,000 mg by mouth 2 (two) times daily with a meal.    metoprolol (TOPROL XL) 200 MG 24 hr tablet Take 1 tablet (200 mg total) by mouth daily.   Metoprolol Succinate 200 MG CS24 See admin instructions.   Misc Natural Products (GLUCOSAMINE CHOND COMPLEX/MSM) TABS See admin instructions.   Multiple Vitamin (MULTIVITAMIN WITH MINERALS) TABS tablet Take 1 tablet by mouth. 5 times weekly   omeprazole (PRILOSEC) 20 MG  capsule Take 20 mg by mouth daily.   omeprazole (PRILOSEC) 20 MG capsule 1 capsule   ondansetron (ZOFRAN) 8 MG tablet Take 1 tablet (8 mg total) by mouth every 8 (eight) hours as needed for nausea or vomiting. Take 1 pill every 8 hrs for 3 days.   ondansetron (ZOFRAN-ODT) 8 MG disintegrating tablet 1 tablet on the tongue and allow to dissolve  as needed   prochlorperazine (COMPAZINE) 10 MG tablet Take 1 tablet (10 mg total) by mouth every 8 (eight) hours as needed for nausea or vomiting.   prochlorperazine (COMPAZINE) 10 MG tablet See admin instructions.   spironolactone (ALDACTONE) 25 MG tablet TAKE 1/2 TABLET BY MOUTH DAILY.   tamsulosin (FLOMAX) 0.4 MG CAPS capsule Take 0.4 mg by mouth at bedtime.   torsemide (DEMADEX) 20 MG tablet Take 2 tablets (40 mg total) by mouth daily.   vitamin B-12 (CYANOCOBALAMIN) 1000 MCG tablet Take 1,000 mcg by mouth. 5 times a week   Current Facility-Administered Medications for the 02/04/21 encounter (Office Visit) with Geralynn Rile, MD  Medication   potassium chloride (KLOR-CON) CR tablet 20 mEq     Allergies:    Patient has no known allergies.   Social History: Social History   Socioeconomic History   Marital status: Married    Spouse name: Not on file   Number of children: Not on file   Years of education: Not on file   Highest education level: Not on file  Occupational  History   Not on file  Tobacco Use   Smoking status: Former    Types: Pipe    Quit date: 11/29/1976    Years since quitting: 44.2   Smokeless tobacco: Never  Vaping Use   Vaping Use: Never used  Substance and Sexual Activity   Alcohol use: Yes    Alcohol/week: 0.0 standard drinks    Comment: Occasional drink    Drug use: Never   Sexual activity: Not on file  Other Topics Concern   Not on file  Social History Narrative   Not on file   Social Determinants of Health   Financial Resource Strain: Not on file  Food Insecurity: Not on file  Transportation Needs: Not on file  Physical Activity: Not on file  Stress: Not on file  Social Connections: Not on file     Family History: The patient's family history includes Heart attack in his mother; Stroke in his father. There is no history of Colon cancer, Esophageal cancer, Pancreatic cancer, Rectal cancer, Stomach cancer, or Colon polyps.  ROS:   All other ROS reviewed and negative. Pertinent positives noted in the HPI.     EKGs/Labs/Other Studies Reviewed:   The following studies were personally reviewed by me today:  TTE 10/27/2020  1. Left ventricular ejection fraction, by estimation, is 20 to 25%. The  left ventricle has severely decreased function. There is akinesis of the  inferior and inferoseptal walls with severe hypokinesis in the remaining  myocardial wall segments. The left  ventricular internal cavity size was mildly dilated. Left ventricular  diastolic parameters are consistent with Grade III diastolic dysfunction  (restrictive). Elevated left atrial pressure. The average left ventricular  global longitudinal strain is -9.2  %. The global longitudinal strain is abnormal.   2. Right ventricular systolic function is normal. The right ventricular  size is normal. There is normal pulmonary artery systolic pressure.   3. Left atrial size was severely dilated.   4. Right atrial size was mildly  dilated.   5. The mitral  valve is normal in structure. Moderate centrally directed  mitral valve regurgitation. No evidence of mitral stenosis.   6. Tricuspid valve regurgitation is mild to moderate.   7. The aortic valve is normal in structure. Aortic valve regurgitation is  mild. No aortic stenosis is present.   8. There is mild dilatation of the ascending aorta, measuring 39 mm.   9. The inferior vena cava is normal in size with greater than 50%  respiratory variability, suggesting right atrial pressure of 3 mmHg.   Recent Labs: 03/21/2020: B Natriuretic Peptide 648.5 03/31/2020: Magnesium 1.6 12/07/2020: ALT 15; BUN 33; Creatinine 1.70; Hemoglobin 9.8; Platelet Count 260; Potassium 4.1; Sodium 141   Recent Lipid Panel    Component Value Date/Time   CHOL 128 07/03/2020 1028   TRIG 124 07/03/2020 1028   HDL 41 07/03/2020 1028   CHOLHDL 3.1 07/03/2020 1028   CHOLHDL 3.9 03/21/2020 1147   VLDL 30 03/21/2020 1147   LDLCALC 65 07/03/2020 1028    Physical Exam:   VS:  BP 106/64 (BP Location: Right Arm, Patient Position: Sitting, Cuff Size: Normal)    Pulse 92    Ht 6' 1" (1.854 m)    Wt 180 lb (81.6 kg)    BMI 23.75 kg/m    Wt Readings from Last 3 Encounters:  02/04/21 180 lb (81.6 kg)  12/07/20 174 lb 14.4 oz (79.3 kg)  11/25/20 177 lb (80.3 kg)    General: Well nourished, well developed, in no acute distress Head: Atraumatic, normal size  Eyes: PEERLA, EOMI  Neck: Supple, no JVD Endocrine: No thryomegaly Cardiac: Normal S1, S2; RRR; no murmurs, rubs, or gallops Lungs: Clear to auscultation bilaterally, no wheezing, rhonchi or rales  Abd: Soft, nontender, no hepatomegaly  Ext: No edema, pulses 2+ Musculoskeletal: No deformities, BUE and BLE strength normal and equal Skin: Warm and dry, no rashes   Neuro: Alert and oriented to person, place, time, and situation, CNII-XII grossly intact, no focal deficits  Psych: Normal mood and affect   ASSESSMENT:   Roy [name] is a 76 y.o. male who presents for  the following: 1. Chronic systolic heart failure ([place])   2. Coronary artery disease involving coronary bypass graft of native heart without angina pectoris   3. Mixed hyperlipidemia   4. Essential hypertension   5. Encounter to establish care   6. Medication management     PLAN:   1. Chronic systolic heart failure ([place]) -Diagnosed with systolic heart failure in February 2022.  EF was 25 to 30%.  Found to have three-vessel CAD.  Underwent surgical revascularization.  EF has not improved. -He is on appropriate guideline directed medical therapy.  He is on digoxin 0.125 mg daily, metoprolol succinate 200 mg daily, Entresto 24-26 mg twice daily, Aldactone 12.5 mg daily.  He is also on Jardiance 10 mg daily. -Euvolemic on exam.  Continue torsemide 40 mg daily. -His course was complicated by testicular cancer.  Now in remission.  Given that his EF has not recovered I would like for him to be considered for ICD.  EP referral today. -His EF has not improved.  He does require digoxin.  We will check a BMP today as well as digoxin level. -I do have my concerns given his low EF without recovery.  I would like for him to see the advanced heart failure clinic with Dr. Glori Bickers.  They may just want to follow him.  For now he  is not in shock or low output but I think it is a good idea for him to discuss his overall care with them. -We do need to be cautious of his kidney function.  It has been stable.  He is tolerating GDMT.  2. Coronary artery disease involving coronary bypass graft of native heart without angina pectoris 3. Mixed hyperlipidemia -Status post four-vessel CABG.  No symptoms of angina.  Continue aspirin 81 mg daily.  He is on Lipitor 40 mg daily.  Most recent LDL 65.  4. Essential hypertension -Well-controlled.  Continue meds.  Disposition: Return in about 4 months (around 06/04/2021).  Medication Adjustments/Labs and Tests Ordered: Current medicines are reviewed at length with  the patient today.  Concerns regarding medicines are outlined above.  Orders Placed This Encounter  Procedures   Basic Metabolic Panel (BMET)   Digoxin level   Ambulatory referral to Cardiac Electrophysiology   AMB referral to CHF clinic   No orders of the defined types were placed in this encounter.   Patient Instructions  Medication Instructions:  Your physician recommends that you continue on your current medications as directed. Please refer to the Current Medication list given to you today.  *If you need a refill on your cardiac medications before your next appointment, please call your pharmacy*   Lab Work: Your physician recommends that you return for lab work in:  TODAY: BMET, Digoxin If you have labs (blood work) drawn today and your tests are completely normal, you will receive your results only by: [place] (if you have MyChart) OR A paper copy in the mail If you have any lab test that is abnormal or we need to change your treatment, we will call you to review the results.   Testing/Procedures: None   Follow-Up: At [facility], you and your health needs are our priority.  As part of our continuing mission to provide you with exceptional heart care, we have created designated Provider Care Teams.  These Care Teams include your primary Cardiologist (physician) and Advanced Practice Providers (APPs -  Physician Assistants and Nurse Practitioners) who all work together to provide you with the care you need, when you need it.  We recommend signing up for the patient portal called "MyChart".  Sign up information is provided on this After Visit Summary.  MyChart is used to connect with patients for Virtual Visits (Telemedicine).  Patients are able to view lab/test results, encounter notes, upcoming appointments, etc.  Non-urgent messages can be sent to your provider as well.   To learn more about what you can do with MyChart, go to [email or web address].    Your  next appointment:   4 month(s)  The format for your next appointment:   In Person  Provider:   [name], MD     Other Instructions     Time Spent with Patient: I have spent a total of 35 minutes with patient reviewing hospital notes, telemetry, EKGs, labs and examining the patient as well as establishing an assessment and plan that was discussed with the patient.  > 50% of time was spent in direct patient care.  Signed, Addison Naegeli. [name], MD, [place]  [address] [place], [place] [address] [phone]  02/04/2021 10:22 AM

## 2021-02-04 ENCOUNTER — Other Ambulatory Visit: Payer: Self-pay

## 2021-02-04 ENCOUNTER — Ambulatory Visit: Payer: Medicare HMO | Admitting: Cardiovascular Disease

## 2021-02-04 ENCOUNTER — Encounter: Payer: Self-pay | Admitting: Cardiovascular Disease

## 2021-02-04 VITALS — BP 106/64 | HR 92 | Ht 73.0 in | Wt 180.0 lb

## 2021-02-04 DIAGNOSIS — I5022 Chronic systolic (congestive) heart failure: Secondary | ICD-10-CM

## 2021-02-04 DIAGNOSIS — Z79899 Other long term (current) drug therapy: Secondary | ICD-10-CM | POA: Diagnosis not present

## 2021-02-04 DIAGNOSIS — I25810 Atherosclerosis of coronary artery bypass graft(s) without angina pectoris: Secondary | ICD-10-CM | POA: Diagnosis not present

## 2021-02-04 DIAGNOSIS — I10 Essential (primary) hypertension: Secondary | ICD-10-CM | POA: Diagnosis not present

## 2021-02-04 DIAGNOSIS — Z7689 Persons encountering health services in other specified circumstances: Secondary | ICD-10-CM

## 2021-02-04 DIAGNOSIS — E782 Mixed hyperlipidemia: Secondary | ICD-10-CM | POA: Diagnosis not present

## 2021-02-04 NOTE — Patient Instructions (Addendum)
Medication Instructions:  Your physician recommends that you continue on your current medications as directed. Please refer to the Current Medication list given to you today.  *If you need a refill on your cardiac medications before your next appointment, please call your pharmacy*   Lab Work: Your physician recommends that you return for lab work in:  TODAY: BMET, Digoxin If you have labs (blood work) drawn today and your tests are completely normal, you will receive your results only by: [place] (if you have MyChart) OR A paper copy in the mail If you have any lab test that is abnormal or we need to change your treatment, we will call you to review the results.   Testing/Procedures: None   Follow-Up: At [facility], you and your health needs are our priority.  As part of our continuing mission to provide you with exceptional heart care, we have created designated Provider Care Teams.  These Care Teams include your primary Cardiologist (physician) and Advanced Practice Providers (APPs -  Physician Assistants and Nurse Practitioners) who all work together to provide you with the care you need, when you need it.  We recommend signing up for the patient portal called "MyChart".  Sign up information is provided on this After Visit Summary.  MyChart is used to connect with patients for Virtual Visits (Telemedicine).  Patients are able to view lab/test results, encounter notes, upcoming appointments, etc.  Non-urgent messages can be sent to your provider as well.   To learn more about what you can do with MyChart, go to [email or web address].    Your next appointment:   4 month(s)  The format for your next appointment:   In Person  Provider:   [name], MD     Other Instructions

## 2021-02-05 ENCOUNTER — Telehealth: Payer: Self-pay | Admitting: *Deleted

## 2021-02-05 ENCOUNTER — Ambulatory Visit
Admission: RE | Admit: 2021-02-05 | Discharge: 2021-02-05 | Disposition: A | Discharge status: Home / Self Care | Payer: Medicare HMO | Source: Ambulatory Visit | Attending: Radiation Oncology | Admitting: Radiation Oncology

## 2021-02-05 ENCOUNTER — Encounter: Payer: Self-pay | Admitting: Hematology

## 2021-02-05 ENCOUNTER — Ambulatory Visit: Payer: Medicare HMO | Admitting: Radiation Oncology

## 2021-02-05 DIAGNOSIS — C8335 Diffuse large B-cell lymphoma, lymph nodes of inguinal region and lower limb: Secondary | ICD-10-CM | POA: Diagnosis not present

## 2021-02-05 DIAGNOSIS — C8339 Diffuse large B-cell lymphoma, extranodal and solid organ sites: Secondary | ICD-10-CM

## 2021-02-05 LAB — BASIC METABOLIC PANEL
BUN/Creatinine Ratio: 13 (ref 10–24)
BUN: 24 mg/dL (ref 8–27)
CO2: 21 mmol/L (ref 20–29)
Calcium: 8.9 mg/dL (ref 8.6–10.2)
Chloride: 103 mmol/L (ref 96–106)
Creatinine, Ser: 1.87 mg/dL — ABNORMAL HIGH (ref 0.76–1.27)
Glucose: 215 mg/dL — ABNORMAL HIGH (ref 70–99)
Potassium: 4.1 mmol/L (ref 3.5–5.2)
Sodium: 142 mmol/L (ref 134–144)
eGFR: 37 mL/min/{1.73_m2} — ABNORMAL LOW (ref >59)

## 2021-02-05 LAB — DIGOXIN LEVEL: Digoxin, Serum: 0.7 ng/mL (ref 0.5–0.9)

## 2021-02-05 NOTE — Telephone Encounter (Signed)
Called patient to ask him  to be here today @ 2:15 pm to talk to Ashlyn, lvm for a return call

## 2021-02-06 NOTE — Progress Notes (Signed)
°  Radiation Oncology         [phone] ________________________________  Name: EVANS LEVEE MRN: [id number]  Date: 02/05/2021  DOB: 05-21-1945  SIMULATION AND TREATMENT PLANNING NOTE    ICD-10-CM   1. Diffuse large b-cell lymphoma, extranodal and solid organ sites [facility])  C83.39       DIAGNOSIS:  76 yo gentleman with Primary left testicular large B cell lymphoma s/p Left orchiectomy, 6 cycles of R-CEOP chemotherapy, and 3 cycles intrathecal MTX at risk for right testicular sanctuary site recurrence  NARRATIVE:  The patient was brought to the [facility].  Identity was confirmed.  All relevant records and images related to the planned course of therapy were reviewed.  The patient freely provided informed written consent to proceed with treatment after reviewing the details related to the planned course of therapy. The consent form was witnessed and verified by the simulation staff.  Then, the patient was set-up in a stable reproducible  supine and frog leg position for radiation therapy.  CT images were obtained.  Surface markings were placed.  The CT images were loaded into the planning software.  Then the target and avoidance structures were contoured.  Treatment planning then occurred.  The radiation prescription was entered and confirmed.  Then, I designed and supervised the construction of a total of 4 medically necessary complex treatment devices including an BodyFix leg positioner, an Accuform cushion lifting the scrotum, super flab bolus under and over the surface of the scrotum and one electron cerrobend cut out to shape electrons around the scrotum while shielding the penis, femoral heads and pelvic organs.  I have requested : Isodose Plan.  PLAN:  The patient will receive 30.6 Gy in 17 fractions of 1.8 Gy for purpose of prophylaxis/cure.  ________________________________  Sheral Apley Tammi Klippel, M.D.

## 2021-02-08 ENCOUNTER — Telehealth: Payer: Self-pay | Admitting: Cardiovascular Disease

## 2021-02-08 NOTE — Telephone Encounter (Signed)
Patient was calling in for results. Please advise  

## 2021-02-08 NOTE — Telephone Encounter (Signed)
Called patient, gave results.  Patient aware to stop Digoxin. Appointment made with Dr.Taylor as well.  Patient had no other questions or concerns.

## 2021-02-09 ENCOUNTER — Inpatient Hospital Stay: Payer: Medicare HMO | Attending: Hematology | Admitting: Hematology

## 2021-02-09 ENCOUNTER — Other Ambulatory Visit: Payer: Self-pay

## 2021-02-09 ENCOUNTER — Ambulatory Visit: Payer: Medicare HMO | Admitting: Radiation Oncology

## 2021-02-09 ENCOUNTER — Other Ambulatory Visit: Payer: Self-pay | Admitting: Cardiovascular Disease

## 2021-02-09 ENCOUNTER — Inpatient Hospital Stay: Payer: Medicare HMO

## 2021-02-09 VITALS — BP 105/66 | HR 94 | Temp 97.3°F | Resp 18 | Wt 182.3 lb

## 2021-02-09 DIAGNOSIS — I129 Hypertensive chronic kidney disease with stage 1 through stage 4 chronic kidney disease, or unspecified chronic kidney disease: Secondary | ICD-10-CM | POA: Diagnosis not present

## 2021-02-09 DIAGNOSIS — N189 Chronic kidney disease, unspecified: Secondary | ICD-10-CM | POA: Insufficient documentation

## 2021-02-09 DIAGNOSIS — I252 Old myocardial infarction: Secondary | ICD-10-CM | POA: Insufficient documentation

## 2021-02-09 DIAGNOSIS — C8330 Diffuse large B-cell lymphoma, unspecified site: Secondary | ICD-10-CM | POA: Diagnosis not present

## 2021-02-09 DIAGNOSIS — E785 Hyperlipidemia, unspecified: Secondary | ICD-10-CM | POA: Diagnosis not present

## 2021-02-09 DIAGNOSIS — K219 Gastro-esophageal reflux disease without esophagitis: Secondary | ICD-10-CM | POA: Diagnosis not present

## 2021-02-09 DIAGNOSIS — Z85828 Personal history of other malignant neoplasm of skin: Secondary | ICD-10-CM | POA: Diagnosis not present

## 2021-02-09 DIAGNOSIS — Z95828 Presence of other vascular implants and grafts: Secondary | ICD-10-CM

## 2021-02-09 DIAGNOSIS — C8335 Diffuse large B-cell lymphoma, lymph nodes of inguinal region and lower limb: Secondary | ICD-10-CM | POA: Diagnosis not present

## 2021-02-09 DIAGNOSIS — E1122 Type 2 diabetes mellitus with diabetic chronic kidney disease: Secondary | ICD-10-CM | POA: Insufficient documentation

## 2021-02-09 LAB — CMP (CANCER CENTER ONLY)
ALT: 12 U/L (ref 0–44)
AST: 16 U/L (ref 15–41)
Albumin: 4.2 g/dL (ref 3.5–5.0)
Alkaline Phosphatase: 57 U/L (ref 38–126)
Anion gap: 11 (ref 5–15)
BUN: 28 mg/dL — ABNORMAL HIGH (ref 8–23)
CO2: 25 mmol/L (ref 22–32)
Calcium: 9.3 mg/dL (ref 8.9–10.3)
Chloride: 105 mmol/L (ref 98–111)
Creatinine: 1.86 mg/dL — ABNORMAL HIGH (ref 0.61–1.24)
GFR, Estimated: 37 mL/min — ABNORMAL LOW (ref >60)
Glucose, Bld: 166 mg/dL — ABNORMAL HIGH (ref 70–99)
Potassium: 4.2 mmol/L (ref 3.5–5.1)
Sodium: 141 mmol/L (ref 135–145)
Total Bilirubin: 0.7 mg/dL (ref 0.3–1.2)
Total Protein: 6.8 g/dL (ref 6.5–8.1)

## 2021-02-09 LAB — CBC WITH DIFFERENTIAL/PLATELET
Abs Immature Granulocytes: 0.07 10*3/uL (ref 0.00–0.07)
Basophils Absolute: 0.1 10*3/uL (ref 0.0–0.1)
Basophils Relative: 1 %
Eosinophils Absolute: 0.2 10*3/uL (ref 0.0–0.5)
Eosinophils Relative: 2 %
HCT: 31.9 % — ABNORMAL LOW (ref 39.0–52.0)
Hemoglobin: 11 g/dL — ABNORMAL LOW (ref 13.0–17.0)
Immature Granulocytes: 1 %
Lymphocytes Relative: 10 %
Lymphs Abs: 0.9 10*3/uL (ref 0.7–4.0)
MCH: 31.4 pg (ref 26.0–34.0)
MCHC: 34.5 g/dL (ref 30.0–36.0)
MCV: 91.1 fL (ref 80.0–100.0)
Monocytes Absolute: 0.9 10*3/uL (ref 0.1–1.0)
Monocytes Relative: 10 %
Neutro Abs: 7.1 10*3/uL (ref 1.7–7.7)
Neutrophils Relative %: 76 %
Platelets: 222 10*3/uL (ref 150–400)
RBC: 3.5 MIL/uL — ABNORMAL LOW (ref 4.22–5.81)
RDW: 15.5 % (ref 11.5–15.5)
WBC: 9.2 10*3/uL (ref 4.0–10.5)
nRBC: 0 % (ref 0.0–0.2)

## 2021-02-09 LAB — LACTATE DEHYDROGENASE: LDH: 192 U/L (ref 98–192)

## 2021-02-09 MED ORDER — HEPARIN SOD (PORK) LOCK FLUSH 100 UNIT/ML IV SOLN
500.0000 [IU] | Freq: Once | INTRAVENOUS | Status: AC
  Administered 2021-02-09: 500 [IU]

## 2021-02-09 MED ORDER — SODIUM CHLORIDE 0.9% FLUSH
10.0000 mL | Freq: Once | INTRAVENOUS | Status: AC
  Administered 2021-02-09: 10 mL

## 2021-02-11 ENCOUNTER — Telehealth: Payer: Self-pay | Admitting: Hematology

## 2021-02-11 NOTE — Telephone Encounter (Signed)
Scheduled follow-up appointment per 1/17 los. Patient is aware.

## 2021-02-15 ENCOUNTER — Encounter: Payer: Self-pay | Admitting: Hematology

## 2021-02-15 NOTE — Progress Notes (Signed)
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 He has recovered well from his chemotherapy and notes no acute new symptoms at this time.  Energy levels have improved.  Good p.o. intake.  His hair has started growing back. He spent some time thinking about whether he would like to complete radiation to the other testis and has finally decided to proceed with this and has been scheduled for this.  No chest pain or shortness of breath. Testicular pain or swelling. No new abdominal pain or distention.  Labs done today reviewed with the patient.  MEDICAL HISTORY:  Past Medical History:  Diagnosis Date   Allergic rhinitis    Arthritis    wrists   Cardiomyopathy, nonischemic [facility])    followed by cardiology--- dr Delton See---  2016 ef 45-50% ,  2016 nuclear ef 37%,  2017 per echo ef 50-55%   CHF (congestive heart failure), NYHA class III (HCC) 03/21/2020   Coronary artery disease    GERD (gastroesophageal reflux disease)    Hiatal hernia    History of kidney stones    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;   01/ 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   History of syncope (03-06-2020 pt stated has not had sycopal episode in few years, stated it seems to happen in extreme hot conditions)   cardiologist--- dr Eloy End--- dx recurrent syncope;  nuclear study 11-03-2014 intermediate risk  w/ no ischemia, apical hypokinesis, nuclear ef 37%;  event monitor-- 12-21-2015 SB/ ST  no pauses/ arrythmia's;  echo 08-03-2015 ef 50-55%   Hyperlipidemia    Hypertension    followed by pcp   Hypovitaminosis D    Mass of left testicle    Nocturia    Plantar fasciitis    Presence of surgical incision    01/ 2022  moh's w/ skin graft of nose, per pt still healing and wear bandage daily   Type 2 diabetes mellitus (HCC)    pt is adament that he is not and have been told he is a diabetic but a borderline;  followed by pcp, in pcp note states DM2 and takes 2 meds daily   Wears glasses    Wears hearing aid in both ears    Weight loss 11/16/2020    SURGICAL HISTORY: Past Surgical History:  Procedure Laterality Date   COLONOSCOPY  last one 01-30-2017   CORONARY ARTERY BYPASS GRAFT N/A 03/25/2020   Procedure: CORONARY ARTERY BYPASS GRAFTING (CABG), ON PUMP, TIMES FOUR, USING BILATERAL INTERNAL MAMMARY ARTERIES AND LEFT RADIAL ARTERY;  Surgeon: Linden Dolin, MD;  Location: [facility] OR;  Service: Open Heart Surgery;  Laterality: N/A;   IR IMAGING GUIDED PORT INSERTION  07/06/2020   LOW ANTERIOR RESECTION RECTUM W/ COLOPROCTOSTOMY  05/2003   MOHS SURGERY  01/2020   nose w/ graft   ORCHIECTOMY Left 03/11/2020   Procedure: Clearance Coots;  Surgeon: Rene Paci, MD;  Location: [place];  Service: Urology;  Laterality: Left;  ONLY NEEDS 60 MIN   RADIAL ARTERY HARVEST Left 03/25/2020   Procedure: LEFT RADIAL ARTERY HARVEST;  Surgeon: [name], MD;  Location: [place];  Service: Open Heart Surgery;  Laterality: Left;   RIGHT/LEFT HEART CATH AND CORONARY ANGIOGRAPHY N/A 03/23/2020   Procedure: RIGHT/LEFT HEART CATH AND CORONARY ANGIOGRAPHY;  Surgeon: [name], MD;  Location: [place] [facility];  Service: Cardiovascular;  Laterality: N/A;   SHOULDER SURGERY Right 1992; 07/ 2021   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 Left 12/24/2008   left  ear and nose    TEE WITHOUT CARDIOVERSION N/A 03/25/2020   Procedure: TRANSESOPHAGEAL ECHOCARDIOGRAM (TEE);  Surgeon: [name], MD;  Location: [place];  Service: Open Heart Surgery;  Laterality: N/A;   TONSILLECTOMY AND ADENOIDECTOMY  child   UPPER GASTROINTESTINAL ENDOSCOPY  last one 06-06-2017    SOCIAL HISTORY: Social History   Socioeconomic History   Marital status: Married    Spouse name: Not on file   Number of children: Not on file   Years of education: Not on file   Highest education level: Not on file  Occupational History   Not on file  Tobacco Use   Smoking status: Former    Types: Pipe    Quit date: 11/29/1976    Years since quitting: 44.2   Smokeless tobacco: Never  Vaping Use   Vaping Use: Never used  Substance and Sexual Activity   Alcohol use: Yes    Alcohol/week: 0.0 standard drinks    Comment: Occasional drink    Drug use: Never   Sexual activity: Not on file  Other Topics Concern   Not on file  Social History Narrative   Not on file   Social Determinants of Health   Financial Resource Strain: Not on file  Food Insecurity: Not on file  Transportation Needs: Not on file  Physical Activity: Not on file  Stress: Not on file  Social Connections: Not on file  Intimate Partner Violence: Not on file    FAMILY HISTORY: Family History  Problem Relation Age of Onset   Heart attack Mother    Stroke Father    Colon cancer Neg Hx    Esophageal cancer Neg Hx    Pancreatic cancer Neg Hx    Rectal cancer Neg Hx    Stomach cancer Neg Hx    Colon polyps Neg Hx     ALLERGIES:  has No Known Allergies.  MEDICATIONS:  Current Outpatient Medications  Medication Sig Dispense Refill   ACCU-CHEK GUIDE test strip      Acetaminophen (ACETAMINOPHEN EXTRA STRENGTH) 500 MG capsule 1 capsule as needed     acetaminophen (TYLENOL) 325 MG tablet Take 2 tablets (650 mg total) by mouth every 4 (four) hours as needed for headache or mild pain.     Apoaequorin  (PREVAGEN PO) Take 1 tablet by mouth at bedtime.     aspirin EC 81 MG tablet Take 81 mg by mouth daily. Swallow whole.     atorvastatin (LIPITOR) 40 MG tablet Take 1 tablet (40 mg total) by mouth daily. 90 tablet 3   atorvastatin (LIPITOR) 40 MG tablet 1 tablet     B-Complex TABS See admin instructions.     Blood Glucose Monitoring Suppl (ACCU-CHEK GUIDE ME) w/Device KIT      cholecalciferol (VITAMIN D) 1000 UNITS tablet Take 2,000 Units by mouth See admin instructions. Mon-friday  COVID-19 mRNA bivalent vaccine, Pfizer, (PFIZER COVID-19 VAC BIVALENT) injection Inject into the muscle. 0.3 mL 0   digoxin (LANOXIN) 0.125 MG tablet Take 1 tablet (0.125 mg total) by mouth daily. (Patient not taking: Reported on 02/09/2021) 90 tablet 3   empagliflozin (JARDIANCE) 10 MG TABS tablet Take 1 tablet (10 mg total) by mouth daily before breakfast. 90 tablet 3   FLUZONE HIGH-DOSE QUADRIVALENT 0.7 ML SUSY      glucosamine-chondroitin 500-400 MG tablet Take 1 tablet by mouth. 5 times weekly     Lancets 33G MISC See admin instructions.     LORazepam (ATIVAN) 0.5 MG tablet Take 1 tablet (0.5 mg total) by mouth every 6 (six) hours as needed for anxiety. Put 1 pill under your tongue every 6 hrs, if needed, for nausea 30 tablet 0   LORazepam (ATIVAN) 0.5 MG tablet 1 tablet at bedtime as needed     metFORMIN (GLUCOPHAGE) 1000 MG tablet Take 1,000 mg by mouth 2 (two) times daily with a meal.      metoprolol (TOPROL XL) 200 MG 24 hr tablet Take 1 tablet (200 mg total) by mouth daily. 90 tablet 3   Metoprolol Succinate 200 MG CS24 See admin instructions.     Misc Natural Products (GLUCOSAMINE CHOND COMPLEX/MSM) TABS See admin instructions.     Multiple Vitamin (MULTIVITAMIN WITH MINERALS) TABS tablet Take 1 tablet by mouth. 5 times weekly     omeprazole (PRILOSEC) 20 MG capsule Take 20 mg by mouth daily.     omeprazole (PRILOSEC) 20 MG capsule 1 capsule     ondansetron (ZOFRAN) 8 MG tablet Take 1 tablet (8 mg total)  by mouth every 8 (eight) hours as needed for nausea or vomiting. Take 1 pill every 8 hrs for 3 days. 30 tablet 3   ondansetron (ZOFRAN-ODT) 8 MG disintegrating tablet 1 tablet on the tongue and allow to dissolve  as needed     potassium chloride SA (KLOR-CON M20) 20 MEQ tablet Take 1 tablet (20 mEq total) by mouth daily. 90 tablet 3   prochlorperazine (COMPAZINE) 10 MG tablet Take 1 tablet (10 mg total) by mouth every 8 (eight) hours as needed for nausea or vomiting. 40 tablet 2   prochlorperazine (COMPAZINE) 10 MG tablet See admin instructions.     sacubitril-valsartan (ENTRESTO) 24-26 MG TAKE 1 TABLET BY MOUTH 2 TIMES DAILY. 180 tablet 3   spironolactone (ALDACTONE) 25 MG tablet TAKE 1/2 TABLET BY MOUTH DAILY. 135 tablet 1   tamsulosin (FLOMAX) 0.4 MG CAPS capsule Take 0.4 mg by mouth at bedtime.     torsemide (DEMADEX) 20 MG tablet Take 2 tablets (40 mg total) by mouth daily. 180 tablet 3   vitamin B-12 (CYANOCOBALAMIN) 1000 MCG tablet Take 1,000 mcg by mouth. 5 times a week     Current Facility-Administered Medications  Medication Dose Route Frequency Provider Last Rate Last Admin   potassium chloride (KLOR-CON) CR tablet 20 mEq  20 mEq Oral BID [name], MD        REVIEW OF SYSTEMS:   .10 Point review of Systems was done is negative except as noted above.  PHYSICAL EXAMINATION: ECOG PERFORMANCE STATUS: 2 - Symptomatic, <50% confined to bed  .BP 105/66 (BP Location: Left Arm, Patient Position: Sitting)    Pulse 94    Temp (!) 97.3 F (36.3 C) (Temporal)    Resp 18    Wt 182 lb 4.8 oz (82.7 kg)    SpO2 100%    BMI  24.05 kg/m  . GENERAL:alert, in no acute distress and comfortable SKIN: no acute rashes, no significant lesions EYES: conjunctiva are pink and non-injected, sclera anicteric OROPHARYNX: MMM, no exudates, no oropharyngeal erythema or ulceration NECK: supple, no JVD LYMPH:  no palpable lymphadenopathy in the cervical, axillary or inguinal regions LUNGS: clear to  auscultation b/l with normal respiratory effort HEART: regular rate & rhythm ABDOMEN:  normoactive bowel sounds , non tender, not distended. Extremity: no pedal edema PSYCH: alert & oriented x 3 with fluent speech NEURO: no focal motor/sensory deficits    LABORATORY DATA:  I have reviewed the data as listed   CBC Latest Ref Rng & Units 02/09/2021 12/07/2020 11/09/2020  WBC 4.0 - 10.5 K/uL 9.2 7.6 14.4(H)  Hemoglobin 13.0 - 17.0 g/dL 11.0(L) 9.8(L) 9.4(L)  Hematocrit 39.0 - 52.0 % 31.9(L) 30.9(L) 28.8(L)  Platelets 150 - 400 K/uL 222 260 136(L)    . CMP Latest Ref Rng & Units 02/09/2021 02/04/2021 12/07/2020  Glucose 70 - 99 mg/dL 029(K) 473(G) 856(D)  BUN 8 - 23 mg/dL 43(T) 24 00(F)  Creatinine 0.61 - 1.24 mg/dL 2.59(T) 0.28(D) 0.22(M)  Sodium 135 - 145 mmol/L 141 142 141  Potassium 3.5 - 5.1 mmol/L 4.2 4.1 4.1  Chloride 98 - 111 mmol/L 105 103 107  CO2 22 - 32 mmol/L 25 21 21(L)  Calcium 8.9 - 10.3 mg/dL 9.3 8.9 4.0(A)  Total Protein 6.5 - 8.1 g/dL 6.8 - 6.6  Total Bilirubin 0.3 - 1.2 mg/dL 0.7 - 0.7  Alkaline Phos 38 - 126 U/L 57 - 73  AST 15 - 41 U/L 16 - 16  ALT 0 - 44 U/L 12 - 15   . Lab Results  Component Value Date   LDH 192 02/09/2021   SURGICAL PATHOLOGY   THIS IS AN ADDENDUM REPORT   CASE: [id number]  PATIENT: Roy [name]  Surgical Pathology Report  Addendum    Reason for Addendum #1:  Molecular Genetic Test Results, FISH   Clinical History: Left testicular mass (crm)      FINAL MICROSCOPIC DIAGNOSIS:   A. TESTICLE, LEFT, ORCHIECTOMY:  -  Diffuse aggressive large B-cell lymphoma  -  See comment    COMMENT:   Sections of testicle show architectural effacement by sheets of large  lymphoid cells with vesicular nuclei and pale cytoplasm.  There is  admixed apoptotic debris and increased mitotic activity.  By  immunohistochemistry, the large lymphoid cells are positive for CD20,  CD5 (dim), BCL6 (dim), MUM1, and BCL2.  They are negative for  CD10, CD30  (<1%), cyclin D1, and TdT.  The Ki67 proliferation index is up to  approximately 70%.  CD3 highlights small T-cells in the background. EBV  is negative by in situ hybridization.   Together, the findings support the diagnosis of a diffuse aggressive  large B-cell lymphoma. The differential diagnosis includes a diffuse  large B-cell lymphoma, NOS, with activated B-cell subtype by the [facility]  algorithm and high-grade B-cell lymphoma with MYC and BCL2 or BCL6  rearrangements.  FISH for BCL2, BCL6, and MYC rearrangements will be  performed. Correlation with clinical and radiographic findings is  recommended for consideration of a primary testicular lymphoma.   Result reported to L. Gibson on 03/16/20 at 1720 by S. O'Neill.    ADDENDUM:   FISH RESULTS:   Results: NORMAL   Interpretation:   BCL6 rearrangement:      Not Detected  MYC rearrangement:  Not Detected  MYC amplification:  Not Detected  BCL6 rearrangement:      Not Detected   Please note this testing was performed and interpreted by an outside  facility ([facility]).  This addendum is only being added to provide a  summary of the results for report completeness.  Please see electronic  medical record for a copy of the full report.   RADIOGRAPHIC STUDIES: I have personally reviewed the radiological images as listed and agreed with the findings in the report. No results found.  ASSESSMENT & PLAN:   76 year old wonderful gentleman with history of hypertension, borderline diabetes, dyslipidemia, GERD with recent STEMI on 03/21/2020 and status post four-vessel CABG on 03/25/2020.  1) Left-sided primary testicular large B-cell lymphoma. Staging to be determined. BCL-2, BCL 6, c-Myc negative.  2) history of acute myocardial infarction status post CABG x4 (06/26/2020) 3) nonischemic and ischemic cardiomyopathy ejection fraction 25 to 30% on last echo. 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 -No other significant respiratory chemotherapy toxicities at this time. No indication for recurrence/progression of his testicular large B-cell lymphoma at this time. Patient has recovered from most of the fatigue and other side effects of his chemotherapy and feels much better with improved appetite. He has decided that she will proceed with prophylactic radiation to the right testis with radiation oncology. -Continue follow-up with cardiology to continue to optimize his management of systolic CHF. -Continue follow-up with primary care physician to continue to optimize treatment of his hypertension diabetes dyslipidemia and other medical issues. 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  -Continue vitamin D 2000 units daily   FOLLOW UP: F/u with rad onc for testicular RT as scheduled RTC with Dr Irene Limbo with portflush and labs in 3 months     All of the patients questions were answered with apparent satisfaction. The patient knows to call the clinic with any problems, questions or concerns.  Sullivan Lone MD [place] AAHIVMS [facility] Hematology/Oncology [facility]

## 2021-02-17 NOTE — Addendum Note (Signed)
Addended by: Tora Kindred on: 02/17/2021 02:43 PM   Modules accepted: Orders

## 2021-02-26 DIAGNOSIS — C6212 Malignant neoplasm of descended left testis: Secondary | ICD-10-CM | POA: Insufficient documentation

## 2021-02-26 DIAGNOSIS — C8339 Diffuse large B-cell lymphoma, extranodal and solid organ sites: Secondary | ICD-10-CM | POA: Diagnosis not present

## 2021-03-01 ENCOUNTER — Ambulatory Visit
Admission: RE | Admit: 2021-03-01 | Discharge: 2021-03-01 | Disposition: A | Discharge status: Home / Self Care | Payer: Medicare HMO | Source: Ambulatory Visit | Attending: Radiation Oncology | Admitting: Radiation Oncology

## 2021-03-01 ENCOUNTER — Other Ambulatory Visit: Payer: Self-pay

## 2021-03-01 DIAGNOSIS — C6212 Malignant neoplasm of descended left testis: Secondary | ICD-10-CM | POA: Diagnosis not present

## 2021-03-01 DIAGNOSIS — C8339 Diffuse large B-cell lymphoma, extranodal and solid organ sites: Secondary | ICD-10-CM | POA: Diagnosis not present

## 2021-03-02 ENCOUNTER — Ambulatory Visit
Admission: RE | Admit: 2021-03-02 | Discharge: 2021-03-02 | Disposition: A | Discharge status: Home / Self Care | Payer: Medicare HMO | Source: Ambulatory Visit | Attending: Radiation Oncology | Admitting: Radiation Oncology

## 2021-03-02 DIAGNOSIS — C8339 Diffuse large B-cell lymphoma, extranodal and solid organ sites: Secondary | ICD-10-CM | POA: Diagnosis not present

## 2021-03-02 DIAGNOSIS — C6212 Malignant neoplasm of descended left testis: Secondary | ICD-10-CM | POA: Diagnosis not present

## 2021-03-03 ENCOUNTER — Ambulatory Visit
Admission: RE | Admit: 2021-03-03 | Discharge: 2021-03-03 | Disposition: A | Discharge status: Home / Self Care | Payer: Medicare HMO | Source: Ambulatory Visit | Attending: Radiation Oncology | Admitting: Radiation Oncology

## 2021-03-03 ENCOUNTER — Other Ambulatory Visit: Payer: Self-pay

## 2021-03-03 DIAGNOSIS — C8339 Diffuse large B-cell lymphoma, extranodal and solid organ sites: Secondary | ICD-10-CM | POA: Diagnosis not present

## 2021-03-03 DIAGNOSIS — C6212 Malignant neoplasm of descended left testis: Secondary | ICD-10-CM | POA: Diagnosis not present

## 2021-03-04 ENCOUNTER — Ambulatory Visit
Admission: RE | Admit: 2021-03-04 | Discharge: 2021-03-04 | Disposition: A | Discharge status: Home / Self Care | Payer: Medicare HMO | Source: Ambulatory Visit | Attending: Radiation Oncology | Admitting: Radiation Oncology

## 2021-03-04 DIAGNOSIS — C6212 Malignant neoplasm of descended left testis: Secondary | ICD-10-CM | POA: Diagnosis not present

## 2021-03-04 DIAGNOSIS — C8339 Diffuse large B-cell lymphoma, extranodal and solid organ sites: Secondary | ICD-10-CM | POA: Diagnosis not present

## 2021-03-05 ENCOUNTER — Ambulatory Visit
Admission: RE | Admit: 2021-03-05 | Discharge: 2021-03-05 | Disposition: A | Discharge status: Home / Self Care | Payer: Medicare HMO | Source: Ambulatory Visit | Attending: Radiation Oncology | Admitting: Radiation Oncology

## 2021-03-05 ENCOUNTER — Other Ambulatory Visit: Payer: Self-pay

## 2021-03-05 DIAGNOSIS — C8339 Diffuse large B-cell lymphoma, extranodal and solid organ sites: Secondary | ICD-10-CM | POA: Diagnosis not present

## 2021-03-05 DIAGNOSIS — C6212 Malignant neoplasm of descended left testis: Secondary | ICD-10-CM

## 2021-03-05 MED ORDER — SONAFINE EX EMUL
1.0000 "application " | Freq: Two times a day (BID) | CUTANEOUS | Status: DC
  Administered 2021-03-05: 1 via TOPICAL

## 2021-03-05 NOTE — Progress Notes (Signed)
Pt here for patient teaching.    Pt given Radiation and You booklet.    Reviewed areas of pertinence such as diarrhea, fatigue, hair loss, nausea and vomiting, sexual and fertility changes, skin changes, and urinary and bladder changes .   Pt able to give teach back of to pat skin, use unscented/gentle soap, and have Imodium on hand,avoid applying anything to skin within 4 hours of treatment.   Pt verbalizes understanding of information given and will contact nursing with any questions or concerns.

## 2021-03-08 ENCOUNTER — Ambulatory Visit
Admission: RE | Admit: 2021-03-08 | Discharge: 2021-03-08 | Disposition: A | Discharge status: Home / Self Care | Payer: Medicare HMO | Source: Ambulatory Visit | Attending: Radiation Oncology | Admitting: Radiation Oncology

## 2021-03-08 ENCOUNTER — Other Ambulatory Visit: Payer: Self-pay

## 2021-03-08 DIAGNOSIS — C8339 Diffuse large B-cell lymphoma, extranodal and solid organ sites: Secondary | ICD-10-CM | POA: Diagnosis not present

## 2021-03-08 DIAGNOSIS — C6212 Malignant neoplasm of descended left testis: Secondary | ICD-10-CM | POA: Diagnosis not present

## 2021-03-09 ENCOUNTER — Ambulatory Visit
Admission: RE | Admit: 2021-03-09 | Discharge: 2021-03-09 | Disposition: A | Discharge status: Home / Self Care | Payer: Medicare HMO | Source: Ambulatory Visit | Attending: Radiation Oncology | Admitting: Radiation Oncology

## 2021-03-09 DIAGNOSIS — C8339 Diffuse large B-cell lymphoma, extranodal and solid organ sites: Secondary | ICD-10-CM | POA: Diagnosis not present

## 2021-03-09 DIAGNOSIS — C6212 Malignant neoplasm of descended left testis: Secondary | ICD-10-CM | POA: Diagnosis not present

## 2021-03-10 ENCOUNTER — Other Ambulatory Visit: Payer: Self-pay

## 2021-03-10 ENCOUNTER — Encounter (HOSPITAL_COMMUNITY): Payer: Self-pay | Admitting: Internal Medicine

## 2021-03-10 ENCOUNTER — Ambulatory Visit (HOSPITAL_COMMUNITY)
Admission: RE | Admit: 2021-03-10 | Discharge: 2021-03-10 | Disposition: A | Discharge status: Home / Self Care | Payer: Medicare HMO | Source: Ambulatory Visit | Attending: Internal Medicine | Admitting: Internal Medicine

## 2021-03-10 ENCOUNTER — Ambulatory Visit: Admission: RE | Admit: 2021-03-10 | Payer: Medicare HMO | Source: Ambulatory Visit

## 2021-03-10 VITALS — BP 98/60 | HR 83 | Wt 182.2 lb

## 2021-03-10 DIAGNOSIS — Z9221 Personal history of antineoplastic chemotherapy: Secondary | ICD-10-CM | POA: Insufficient documentation

## 2021-03-10 DIAGNOSIS — Z7982 Long term (current) use of aspirin: Secondary | ICD-10-CM | POA: Diagnosis not present

## 2021-03-10 DIAGNOSIS — C6212 Malignant neoplasm of descended left testis: Secondary | ICD-10-CM | POA: Diagnosis not present

## 2021-03-10 DIAGNOSIS — I10 Essential (primary) hypertension: Secondary | ICD-10-CM | POA: Diagnosis not present

## 2021-03-10 DIAGNOSIS — C8339 Diffuse large B-cell lymphoma, extranodal and solid organ sites: Secondary | ICD-10-CM | POA: Diagnosis not present

## 2021-03-10 DIAGNOSIS — Z951 Presence of aortocoronary bypass graft: Secondary | ICD-10-CM | POA: Insufficient documentation

## 2021-03-10 DIAGNOSIS — E1122 Type 2 diabetes mellitus with diabetic chronic kidney disease: Secondary | ICD-10-CM | POA: Insufficient documentation

## 2021-03-10 DIAGNOSIS — I2510 Atherosclerotic heart disease of native coronary artery without angina pectoris: Secondary | ICD-10-CM | POA: Diagnosis not present

## 2021-03-10 DIAGNOSIS — Z79899 Other long term (current) drug therapy: Secondary | ICD-10-CM | POA: Diagnosis not present

## 2021-03-10 DIAGNOSIS — I25810 Atherosclerosis of coronary artery bypass graft(s) without angina pectoris: Secondary | ICD-10-CM | POA: Insufficient documentation

## 2021-03-10 DIAGNOSIS — I5022 Chronic systolic (congestive) heart failure: Secondary | ICD-10-CM | POA: Insufficient documentation

## 2021-03-10 DIAGNOSIS — Z7901 Long term (current) use of anticoagulants: Secondary | ICD-10-CM | POA: Diagnosis not present

## 2021-03-10 DIAGNOSIS — N1832 Chronic kidney disease, stage 3b: Secondary | ICD-10-CM | POA: Insufficient documentation

## 2021-03-10 DIAGNOSIS — I5189 Other ill-defined heart diseases: Secondary | ICD-10-CM

## 2021-03-10 DIAGNOSIS — I130 Hypertensive heart and chronic kidney disease with heart failure and stage 1 through stage 4 chronic kidney disease, or unspecified chronic kidney disease: Secondary | ICD-10-CM | POA: Diagnosis not present

## 2021-03-10 DIAGNOSIS — Z7984 Long term (current) use of oral hypoglycemic drugs: Secondary | ICD-10-CM | POA: Diagnosis not present

## 2021-03-10 DIAGNOSIS — Z8547 Personal history of malignant neoplasm of testis: Secondary | ICD-10-CM | POA: Diagnosis not present

## 2021-03-10 NOTE — Progress Notes (Signed)
ADVANCED HF CLINIC CONSULT NOTE  Referring Physician: Dr. [name]  Primary Care: [name], DO Primary Cardiologist: [name], MD   HPI:  Roy [name] is a 76 y.o. 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   Has h/o unexplained syncope. Follwoed by Dr. [name] in past for presumed NICM.  Echo 9/16 45-50% with regional wall motion abnormalities in the mid LAD distribution but has had no symptoms of chest pain. In October 2016 had nuclear stress EF 37% with apical HK He had a repeat 2D echo 08/03/15 LVEF 50-55%.    Found to have testicular lymphoma in 10/21.   Admitted 2/22 for HF. Cath with 3v CAD EF 25%. Underwent 4v CABG with Dr. [name].   Underwent left testicular resection (felt to be encapsulated with no evidence of remote spread) + R-CHOP chemo with intrathecal MTX after CABG in 5/22. Now getting XRT to opposite testicle   Has been followed by Dr. [name]. Has done well with GDMT. NYHA II. However EF remains 20-25%. Denies CP, SOB, orthopnea , PND or edema. Completed CR in [place]. Now doing [facility] at [facility] 2-3x/week and doing TM and recumbent bike.   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    Problem List 1. 3vCAD -CABG x 4 03/25/2020 (LIMA-LAD, RIMA-PDA, radial artery OM1/2) 2. Systolic HF, ischemic -0/3212: 25-30% -4//2022: 20-25% -10/2020: 20-25% 3. DM -A1c 6.2 4. HLD -T chol 140, HDL 36, LDL 74, TG 148 5. CKD III 6. 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   Review of Systems: [y] = yes, [ ] = no   General: Weight gain [ ]; Weight loss [ ]; Anorexia [ ]; Fatigue [ ]; Fever [ ]; Chills [ ]; Weakness [ ]  Cardiac: Chest pain/pressure [ ]; Resting SOB [ ]; Exertional SOB [ ]; Orthopnea [ ]; Pedal Edema [ ];  Palpitations [ ]; Syncope [ y]; Presyncope [ ]; Paroxysmal nocturnal dyspnea[ ]  Pulmonary: Cough [ ]; Wheezing[ ]; Hemoptysis[ ]; Sputum [ ]; Snoring [ ]  GI: Vomiting[ ]; Dysphagia[ ]; Melena[ ]; Hematochezia [ ]; Heartburn[ ]; Abdominal pain [ ]; Constipation [ ]; Diarrhea [ ]; BRBPR [ ]  GU: Hematuria[ ]; Dysuria [ ]; Nocturia[ ]  Vascular: Pain in legs with walking [ ]; Pain in feet with lying flat [ ]; Non-healing sores [ ]; Stroke [ ]; TIA [ ]; Slurred speech [ ];  Neuro: Headaches[ ]; Vertigo[ ]; Seizures[ ]; Paresthesias[ ];Blurred vision [ ]; Diplopia [ ]; Vision changes [ ]  Ortho/Skin: Arthritis Blue.Reese ]; Joint pain [ y]; Muscle pain [ ]; Joint swelling [ ]; Back Pain [ ]; Rash [ ]  Psych: Depression[ ]; Anxiety[ ]  Heme: Bleeding problems [ ]; Clotting disorders [ ]; Anemia [ ]  Endocrine: Diabetes [ y]; Thyroid dysfunction[ ]   Past Medical History:  Diagnosis Date   Allergic rhinitis    Arthritis    wrists   Cardiomyopathy, nonischemic [facility])    followed by cardiology--- dr [name]---  2016 ef 45-50% ,  2016 nuclear ef 37%,  2017 per echo ef 50-55%   CHF (congestive heart failure), NYHA class III ([place]) 03/21/2020   Coronary artery disease    GERD (gastroesophageal reflux disease)    Hiatal  hernia    History of kidney stones    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;   01/ 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   History of syncope (03-06-2020 pt stated has not had sycopal episode in few years, stated it seems to happen in extreme hot conditions)   cardiologist--- dr [name]--- dx recurrent syncope;  nuclear study 11-03-2014 intermediate risk w/ no ischemia, apical hypokinesis, nuclear ef 37%;  event monitor-- 12-21-2015 SB/ ST  no pauses/ arrythmia's;  echo 08-03-2015 ef 50-55%   Hyperlipidemia    Hypertension    followed by pcp   Hypovitaminosis D    Mass of left testicle    Nocturia    Plantar fasciitis    Presence of surgical incision     01/ 2022  moh's w/ skin graft of nose, per pt still healing and wear bandage daily   Type 2 diabetes mellitus ([place])    pt is adament that he is not and have been told he is a diabetic but a borderline;  followed by pcp, in pcp note states DM2 and takes 2 meds daily   Wears glasses    Wears hearing aid in both ears    Weight loss 11/16/2020    Current Outpatient Medications  Medication Sig Dispense Refill   ACCU-CHEK GUIDE test strip      Apoaequorin (PREVAGEN PO) Take 1 tablet by mouth at bedtime.     aspirin EC 81 MG tablet Take 81 mg by mouth daily. Swallow whole.     atorvastatin (LIPITOR) 40 MG tablet Take 1 tablet (40 mg total) by mouth daily. 90 tablet 3   B-Complex TABS See admin instructions.     Blood Glucose Monitoring Suppl (ACCU-CHEK GUIDE ME) w/Device KIT      cholecalciferol (VITAMIN D) 1000 UNITS tablet Take 2,000 Units by mouth See admin instructions. Mon-friday     empagliflozin (JARDIANCE) 10 MG TABS tablet Take 1 tablet (10 mg total) by mouth daily before breakfast. 90 tablet 3   glucosamine-chondroitin 500-400 MG tablet Take 1 tablet by mouth. 5 times weekly     Lancets 33G MISC See admin instructions.     LORazepam (ATIVAN) 0.5 MG tablet Take 1 tablet (0.5 mg total) by mouth every 6 (six) hours as needed for anxiety. Put 1 pill under your tongue every 6 hrs, if needed, for nausea 30 tablet 0   LORazepam (ATIVAN) 0.5 MG tablet 1 tablet at bedtime as needed     metFORMIN (GLUCOPHAGE) 1000 MG tablet Take 1,000 mg by mouth 2 (two) times daily with a meal.      metoprolol (TOPROL XL) 200 MG 24 hr tablet Take 1 tablet (200 mg total) by mouth daily. 90 tablet 3   Misc Natural Products (GLUCOSAMINE CHOND COMPLEX/MSM) TABS See admin instructions.     Multiple Vitamin (MULTIVITAMIN WITH MINERALS) TABS tablet Take 1 tablet by mouth. 5 times weekly     omeprazole (PRILOSEC) 20 MG capsule Take 20 mg by mouth daily.     ondansetron (ZOFRAN) 8 MG tablet Take 1 tablet (8 mg total)  by mouth every 8 (eight) hours as needed for nausea or vomiting. Take 1 pill every 8 hrs for 3 days. 30 tablet 3   ondansetron (ZOFRAN-ODT) 8 MG disintegrating tablet 1 tablet on the tongue and allow to dissolve  as needed     potassium chloride SA (KLOR-CON M20) 20 MEQ tablet Take 1 tablet (  20 mEq total) by mouth daily. 90 tablet 3   prochlorperazine (COMPAZINE) 10 MG tablet Take 1 tablet (10 mg total) by mouth every 8 (eight) hours as needed for nausea or vomiting. 40 tablet 2   prochlorperazine (COMPAZINE) 10 MG tablet See admin instructions.     sacubitril-valsartan (ENTRESTO) 24-26 MG TAKE 1 TABLET BY MOUTH 2 TIMES DAILY. 180 tablet 3   spironolactone (ALDACTONE) 25 MG tablet TAKE 1/2 TABLET BY MOUTH DAILY. 135 tablet 1   tamsulosin (FLOMAX) 0.4 MG CAPS capsule Take 0.4 mg by mouth at bedtime.     torsemide (DEMADEX) 20 MG tablet Take 20 mg by mouth daily.     vitamin B-12 (CYANOCOBALAMIN) 1000 MCG tablet Take 1,000 mcg by mouth. 5 times a week     Current Facility-Administered Medications  Medication Dose Route Frequency Provider Last Rate Last Admin   potassium chloride (KLOR-CON) CR tablet 20 mEq  20 mEq Oral BID Atkins, Glenice Bow, MD        No Known Allergies    Social History   Socioeconomic History   Marital status: Married    Spouse name: Not on file   Number of children: Not on file   Years of education: Not on file   Highest education level: Not on file  Occupational History   Not on file  Tobacco Use   Smoking status: Former    Types: Pipe    Quit date: 11/29/1976    Years since quitting: 44.3   Smokeless tobacco: Never  Vaping Use   Vaping Use: Never used  Substance and Sexual Activity   Alcohol use: Yes    Alcohol/week: 0.0 standard drinks    Comment: Occasional drink    Drug use: Never   Sexual activity: Not on file  Other Topics Concern   Not on file  Social History Narrative   Not on file   Social Determinants of Health   Financial Resource Strain:  Not on file  Food Insecurity: Not on file  Transportation Needs: Not on file  Physical Activity: Not on file  Stress: Not on file  Social Connections: Not on file  Intimate Partner Violence: Not on file      Family History  Problem Relation Age of Onset   Heart attack Mother    Stroke Father    Colon cancer Neg Hx    Esophageal cancer Neg Hx    Pancreatic cancer Neg Hx    Rectal cancer Neg Hx    Stomach cancer Neg Hx    Colon polyps Neg Hx     Vitals:   03/10/21 1504  BP: 98/60  Pulse: 83  SpO2: 100%  Weight: 82.6 kg (182 lb 3.2 oz)    PHYSICAL EXAM: General:  Well appearing. No respiratory difficulty HEENT: normal Neck: supple. no JVD. Carotids 2+ bilat; no bruits. No lymphadenopathy or thryomegaly appreciated. Cor: PMI nondisplaced. Regular rate & rhythm. No rubs, gallops or murmurs. Lungs: clear Abdomen: soft, nontender, nondistended. No hepatosplenomegaly. No bruits or masses. Good bowel sounds. Extremities: no cyanosis, clubbing, rash, edema Neuro: alert & oriented x 3, cranial nerves grossly intact. moves all 4 extremities w/o difficulty. Affect pleasant.  ECG: NSR 86 Non-specific ST-T abnormalities. Personally reviewed   ASSESSMENT & PLAN:   1. Chronic systolic heart failure ([place]) - h/o presumed NICM dating back to 2016 -Diagnosed with systolic heart failure in February 2022.  EF was 25 to 30%.  Found to have three-vessel CAD.  s/p 4/v CABG 3/22 -  NYHA II  - Volume status OK on torsemide 20 daily. Can switch to prn as needed - He is on appropriate guideline directed medical therapy and expertly managed by Dr. [name] - I have nothing else to add.  He is on digoxin 0.125 mg daily, metoprolol succinate 200 mg daily, Entresto 24-26 mg twice daily, Aldactone 12.5 mg daily. Jardiance 10 mg daily. No room to titrate. Likely can stop digoxin soon - Pending ICD eval  -His course was complicated by testicular cancer.  Now in remission. Had R-CHOP but this came AFTER  LV dysfunction so doubt contributing - Will get cMRI to further evaluate - We discussed possiblity of mechanical support down the road if needed and age not prohibitive  2. Coronary artery disease involving coronary bypass graft of native heart without angina pectoris -Status post four-vessel CABG.  No symptoms of angina.  Continue aspirin 81 mg daily.  He is on Lipitor 40 mg daily.  Most recent LDL 65. Per Dr. [name]    3. Essential hypertension -Well-controlled.  Continue meds.  4. Testicular lymphoma - s/p resection and chemo -> completed 11/22 - no doing XRT  Glori Bickers, MD  3:47 PM

## 2021-03-10 NOTE — Patient Instructions (Signed)
Thank you for your visit today.  Your physician has requested that you have a cardiac MRI. Cardiac MRI uses a computer to create images of your heart as its beating, producing both still and moving pictures of your heart and major blood vessels. For further information please visit [email or web address] Please follow the instruction sheet given to you today for more information. Once approved by insurance you will be called to arrange the appointment.  Your physician recommends that you schedule a follow-up appointment in: 6 months (August 2023) ** please call the office in July to arrange the follow up.  If you have any questions or concerns before your next appointment please send [place] a message through [place] or call our office at [phone].    TO LEAVE A MESSAGE FOR THE NURSE SELECT OPTION 2, PLEASE LEAVE A MESSAGE INCLUDING: YOUR NAME DATE OF BIRTH CALL BACK NUMBER REASON FOR CALL**this is important as we prioritize the call backs  YOU WILL RECEIVE A CALL BACK THE SAME DAY AS LONG AS YOU CALL BEFORE 4:00 PM  At the [facility], you and your health needs are our priority. As part of our continuing mission to provide you with exceptional heart care, we have created designated Provider Care Teams. These Care Teams include your primary Cardiologist (physician) and Advanced Practice Providers (APPs- Physician Assistants and Nurse Practitioners) who all work together to provide you with the care you need, when you need it.   You may see any of the following providers on your designated Care Team at your next follow up: Dr Glori Bickers Dr Haynes Kerns, NP Lyda Jester, [place] [facility] [place], [place] Audry Riles, PharmD   Please be sure to bring in all your medications bottles to every appointment.

## 2021-03-11 ENCOUNTER — Ambulatory Visit
Admission: RE | Admit: 2021-03-11 | Discharge: 2021-03-11 | Disposition: A | Discharge status: Home / Self Care | Payer: Medicare HMO | Source: Ambulatory Visit | Attending: Radiation Oncology | Admitting: Radiation Oncology

## 2021-03-11 DIAGNOSIS — C6212 Malignant neoplasm of descended left testis: Secondary | ICD-10-CM | POA: Diagnosis not present

## 2021-03-11 DIAGNOSIS — C8339 Diffuse large B-cell lymphoma, extranodal and solid organ sites: Secondary | ICD-10-CM | POA: Diagnosis not present

## 2021-03-12 ENCOUNTER — Other Ambulatory Visit: Payer: Self-pay

## 2021-03-12 ENCOUNTER — Ambulatory Visit
Admission: RE | Admit: 2021-03-12 | Discharge: 2021-03-12 | Disposition: A | Discharge status: Home / Self Care | Payer: Medicare HMO | Source: Ambulatory Visit | Attending: Radiation Oncology | Admitting: Radiation Oncology

## 2021-03-12 DIAGNOSIS — C8339 Diffuse large B-cell lymphoma, extranodal and solid organ sites: Secondary | ICD-10-CM | POA: Diagnosis not present

## 2021-03-12 DIAGNOSIS — C6212 Malignant neoplasm of descended left testis: Secondary | ICD-10-CM | POA: Diagnosis not present

## 2021-03-15 ENCOUNTER — Other Ambulatory Visit: Payer: Self-pay

## 2021-03-15 ENCOUNTER — Ambulatory Visit
Admission: RE | Admit: 2021-03-15 | Discharge: 2021-03-15 | Disposition: A | Discharge status: Home / Self Care | Payer: Medicare HMO | Source: Ambulatory Visit | Attending: Radiation Oncology | Admitting: Radiation Oncology

## 2021-03-15 DIAGNOSIS — C8339 Diffuse large B-cell lymphoma, extranodal and solid organ sites: Secondary | ICD-10-CM | POA: Diagnosis not present

## 2021-03-15 DIAGNOSIS — C6212 Malignant neoplasm of descended left testis: Secondary | ICD-10-CM | POA: Diagnosis not present

## 2021-03-16 ENCOUNTER — Ambulatory Visit
Admission: RE | Admit: 2021-03-16 | Discharge: 2021-03-16 | Disposition: A | Discharge status: Home / Self Care | Payer: Medicare HMO | Source: Ambulatory Visit | Attending: Radiation Oncology | Admitting: Radiation Oncology

## 2021-03-16 DIAGNOSIS — C6212 Malignant neoplasm of descended left testis: Secondary | ICD-10-CM | POA: Diagnosis not present

## 2021-03-16 DIAGNOSIS — C8339 Diffuse large B-cell lymphoma, extranodal and solid organ sites: Secondary | ICD-10-CM | POA: Diagnosis not present

## 2021-03-17 ENCOUNTER — Ambulatory Visit
Admission: RE | Admit: 2021-03-17 | Discharge: 2021-03-17 | Disposition: A | Discharge status: Home / Self Care | Payer: Medicare HMO | Source: Ambulatory Visit | Attending: Radiation Oncology | Admitting: Radiation Oncology

## 2021-03-17 ENCOUNTER — Other Ambulatory Visit: Payer: Self-pay

## 2021-03-17 DIAGNOSIS — C8339 Diffuse large B-cell lymphoma, extranodal and solid organ sites: Secondary | ICD-10-CM | POA: Diagnosis not present

## 2021-03-17 DIAGNOSIS — C6212 Malignant neoplasm of descended left testis: Secondary | ICD-10-CM | POA: Diagnosis not present

## 2021-03-18 ENCOUNTER — Ambulatory Visit
Admission: RE | Admit: 2021-03-18 | Discharge: 2021-03-18 | Disposition: A | Discharge status: Home / Self Care | Payer: Medicare HMO | Source: Ambulatory Visit | Attending: Radiation Oncology | Admitting: Radiation Oncology

## 2021-03-18 DIAGNOSIS — C6212 Malignant neoplasm of descended left testis: Secondary | ICD-10-CM | POA: Diagnosis not present

## 2021-03-18 DIAGNOSIS — C8339 Diffuse large B-cell lymphoma, extranodal and solid organ sites: Secondary | ICD-10-CM | POA: Diagnosis not present

## 2021-03-19 ENCOUNTER — Ambulatory Visit
Admission: RE | Admit: 2021-03-19 | Discharge: 2021-03-19 | Disposition: A | Discharge status: Home / Self Care | Payer: Medicare HMO | Source: Ambulatory Visit | Attending: Radiation Oncology | Admitting: Radiation Oncology

## 2021-03-19 ENCOUNTER — Other Ambulatory Visit: Payer: Self-pay

## 2021-03-19 DIAGNOSIS — C6212 Malignant neoplasm of descended left testis: Secondary | ICD-10-CM | POA: Diagnosis not present

## 2021-03-19 DIAGNOSIS — C8339 Diffuse large B-cell lymphoma, extranodal and solid organ sites: Secondary | ICD-10-CM | POA: Diagnosis not present

## 2021-03-22 ENCOUNTER — Ambulatory Visit
Admission: RE | Admit: 2021-03-22 | Discharge: 2021-03-22 | Disposition: A | Discharge status: Home / Self Care | Payer: Medicare HMO | Source: Ambulatory Visit | Attending: Radiation Oncology | Admitting: Radiation Oncology

## 2021-03-22 DIAGNOSIS — C8339 Diffuse large B-cell lymphoma, extranodal and solid organ sites: Secondary | ICD-10-CM | POA: Diagnosis not present

## 2021-03-22 DIAGNOSIS — C6212 Malignant neoplasm of descended left testis: Secondary | ICD-10-CM | POA: Diagnosis not present

## 2021-03-23 ENCOUNTER — Encounter: Payer: Self-pay | Admitting: Urology

## 2021-03-23 ENCOUNTER — Other Ambulatory Visit: Payer: Self-pay

## 2021-03-23 ENCOUNTER — Ambulatory Visit
Admission: RE | Admit: 2021-03-23 | Discharge: 2021-03-23 | Disposition: A | Discharge status: Home / Self Care | Payer: Medicare HMO | Source: Ambulatory Visit | Attending: Radiation Oncology | Admitting: Radiation Oncology

## 2021-03-23 DIAGNOSIS — C8339 Diffuse large B-cell lymphoma, extranodal and solid organ sites: Secondary | ICD-10-CM

## 2021-03-23 DIAGNOSIS — C6212 Malignant neoplasm of descended left testis: Secondary | ICD-10-CM | POA: Diagnosis not present

## 2021-03-25 ENCOUNTER — Telehealth (HOSPITAL_COMMUNITY): Payer: Self-pay | Admitting: *Deleted

## 2021-03-25 ENCOUNTER — Encounter: Payer: Self-pay | Admitting: Internal Medicine

## 2021-03-25 ENCOUNTER — Other Ambulatory Visit: Payer: Self-pay

## 2021-03-25 ENCOUNTER — Ambulatory Visit: Payer: Medicare HMO | Admitting: Internal Medicine

## 2021-03-25 VITALS — BP 98/50 | HR 96 | Ht 73.0 in | Wt 184.0 lb

## 2021-03-25 DIAGNOSIS — I428 Other cardiomyopathies: Secondary | ICD-10-CM

## 2021-03-25 NOTE — Patient Instructions (Addendum)
Medication Instructions:  ?Your physician recommends that you continue on your current medications as directed. Please refer to the Current Medication list given to you today. ? ?Labwork: ?None ordered. ? ?Testing/Procedures: ?None ordered. ? ?Follow-Up: ? ?The following dates are available for ICD implant: ? ?April 3, 5, 11, 18, 19, 24 ? ? ?Any Other Special Instructions Will Be Listed Below (If Applicable). ? ?If you need a refill on your cardiac medications before your next appointment, please call your pharmacy.  ? ?ICD education pamphlet given ? ? ? ? ? ?

## 2021-03-25 NOTE — Progress Notes (Signed)
HPI [name] is referred today by Dr. Farris Has for consideration for ICD insertion. 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 Lottie Dawson and now Dr. Reine Just.  No Known Allergies   Current Outpatient Medications  Medication Sig Dispense Refill   ACCU-CHEK GUIDE test strip      Apoaequorin (PREVAGEN PO) Take 1 tablet by mouth at bedtime.     aspirin EC 81 MG tablet Take 81 mg by mouth daily. Swallow whole.     atorvastatin (LIPITOR) 40 MG tablet Take 1 tablet (40 mg total) by mouth daily. 90 tablet 3   B-Complex TABS See admin instructions.     Blood Glucose Monitoring Suppl (ACCU-CHEK GUIDE ME) w/Device KIT      cholecalciferol (VITAMIN D) 1000 UNITS tablet Take 2,000 Units by mouth See admin instructions. Mon-friday     empagliflozin (JARDIANCE) 10 MG TABS tablet Take 1 tablet (10 mg total) by mouth daily before breakfast. 90 tablet 3   glucosamine-chondroitin 500-400 MG tablet Take 1 tablet by mouth. 5 times weekly     Lancets 33G MISC See admin instructions.     LORazepam (ATIVAN) 0.5 MG tablet Take 1 tablet (0.5 mg total) by mouth every 6 (six) hours as needed for anxiety. Put 1 pill under your tongue every 6 hrs, if needed, for nausea 30 tablet 0   LORazepam (ATIVAN) 0.5 MG tablet 1 tablet at bedtime as needed     metFORMIN (GLUCOPHAGE) 1000 MG tablet Take 1,000 mg by mouth 2 (two) times daily with a meal.      metoprolol (TOPROL XL) 200 MG 24 hr tablet Take 1 tablet (200 mg total) by mouth daily. 90 tablet 3   Misc Natural Products (GLUCOSAMINE CHOND COMPLEX/MSM) TABS See admin instructions.     Multiple Vitamin (MULTIVITAMIN WITH MINERALS) TABS tablet Take 1 tablet by mouth. 5 times weekly     omeprazole  (PRILOSEC) 20 MG capsule Take 20 mg by mouth daily.     ondansetron (ZOFRAN) 8 MG tablet Take 1 tablet (8 mg total) by mouth every 8 (eight) hours as needed for nausea or vomiting. Take 1 pill every 8 hrs for 3 days. 30 tablet 3   ondansetron (ZOFRAN-ODT) 8 MG disintegrating tablet 1 tablet on the tongue and allow to dissolve  as needed     potassium chloride SA (KLOR-CON M20) 20 MEQ tablet Take 1 tablet (20 mEq total) by mouth daily. 90 tablet 3   prochlorperazine (COMPAZINE) 10 MG tablet Take 1 tablet (10 mg total) by mouth every 8 (eight) hours as needed for nausea or vomiting. 40 tablet 2   prochlorperazine (COMPAZINE) 10 MG tablet See admin instructions.     sacubitril-valsartan (ENTRESTO) 24-26 MG TAKE 1 TABLET BY MOUTH 2 TIMES DAILY. 180 tablet 3   spironolactone (ALDACTONE) 25 MG tablet TAKE 1/2 TABLET BY MOUTH DAILY. 135 tablet 1   tamsulosin (FLOMAX) 0.4 MG CAPS capsule Take 0.4 mg by mouth at bedtime.     torsemide (DEMADEX) 20 MG tablet Take 20 mg by mouth daily.     vitamin B-12 (CYANOCOBALAMIN) 1000 MCG tablet Take 1,000 mcg by mouth. 5 times a week  Current Facility-Administered Medications  Medication Dose Route Frequency Provider Last Rate Last Admin   potassium chloride (KLOR-CON) CR tablet 20 mEq  20 mEq Oral BID [name], MD         Past Medical History:  Diagnosis Date   Allergic rhinitis    Arthritis    wrists   Cardiomyopathy, nonischemic [facility])    followed by cardiology--- dr [name]---  2016 ef 45-50% ,  2016 nuclear ef 37%,  2017 per echo ef 50-55%   CHF (congestive heart failure), NYHA class III ([place]) 03/21/2020   Coronary artery disease    GERD (gastroesophageal reflux disease)    Hiatal hernia    History of kidney stones    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;   01/ 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   History of syncope (03-06-2020 pt stated has not had sycopal episode in few years, stated it seems to  happen in extreme hot conditions)   cardiologist--- dr [name]--- dx recurrent syncope;  nuclear study 11-03-2014 intermediate risk w/ no ischemia, apical hypokinesis, nuclear ef 37%;  event monitor-- 12-21-2015 SB/ ST  no pauses/ arrythmia's;  echo 08-03-2015 ef 50-55%   Hyperlipidemia    Hypertension    followed by pcp   Hypovitaminosis D    Mass of left testicle    Nocturia    Plantar fasciitis    Presence of surgical incision    01/ 2022  moh's w/ skin graft of nose, per pt still healing and wear bandage daily   Type 2 diabetes mellitus ([place])    pt is adament that he is not and have been told he is a diabetic but a borderline;  followed by pcp, in pcp note states DM2 and takes 2 meds daily   Wears glasses    Wears hearing aid in both ears    Weight loss 11/16/2020    ROS:   All systems reviewed and negative except as noted in the HPI.   Past Surgical History:  Procedure Laterality Date   COLONOSCOPY  last one 01-30-2017   CORONARY ARTERY BYPASS GRAFT N/A 03/25/2020   Procedure: CORONARY ARTERY BYPASS GRAFTING (CABG), ON PUMP, TIMES FOUR, USING BILATERAL INTERNAL MAMMARY ARTERIES AND LEFT RADIAL ARTERY;  Surgeon: [name], MD;  Location: [facility];  Service: Open Heart Surgery;  Laterality: N/A;   IR IMAGING GUIDED PORT INSERTION  07/06/2020   LOW ANTERIOR RESECTION RECTUM W/ COLOPROCTOSTOMY  05/2003   MOHS SURGERY  01/2020   nose w/ graft   ORCHIECTOMY Left 03/11/2020   Procedure: [place];  Surgeon: [name], MD;  Location: [facility];  Service: Urology;  Laterality: Left;  ONLY NEEDS 60 MIN   RADIAL ARTERY HARVEST Left 03/25/2020   Procedure: LEFT RADIAL ARTERY HARVEST;  Surgeon: [name], MD;  Location: [facility];  Service: Open Heart Surgery;  Laterality: Left;   RIGHT/LEFT HEART CATH AND CORONARY ANGIOGRAPHY N/A 03/23/2020   Procedure: RIGHT/LEFT HEART CATH AND CORONARY ANGIOGRAPHY;  Surgeon: [name], MD;   Location: [facility];  Service: Cardiovascular;  Laterality: N/A;   SHOULDER SURGERY Right 1992; 07/ 2021   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 Left 12/24/2008   left ear and nose    TEE WITHOUT CARDIOVERSION N/A 03/25/2020   Procedure: TRANSESOPHAGEAL ECHOCARDIOGRAM (TEE);  Surgeon: [name], MD;  Location: [facility];  Service: Open  Heart Surgery;  Laterality: N/A;   TONSILLECTOMY AND ADENOIDECTOMY  child   UPPER GASTROINTESTINAL ENDOSCOPY  last one 06-06-2017     Family History  Problem Relation Age of Onset   Heart attack Mother    Stroke Father    Colon cancer Neg Hx    Esophageal cancer Neg Hx    Pancreatic cancer Neg Hx    Rectal cancer Neg Hx    Stomach cancer Neg Hx    Colon polyps Neg Hx      Social History   Socioeconomic History   Marital status: Married    Spouse name: Not on file   Number of children: Not on file   Years of education: Not on file   Highest education level: Not on file  Occupational History   Not on file  Tobacco Use   Smoking status: Former    Types: Pipe    Quit date: 11/29/1976    Years since quitting: 44.3   Smokeless tobacco: Never  Vaping Use   Vaping Use: Never used  Substance and Sexual Activity   Alcohol use: Yes    Alcohol/week: 0.0 standard drinks    Comment: Occasional drink    Drug use: Never   Sexual activity: Not on file  Other Topics Concern   Not on file  Social History Narrative   Not on file   Social Determinants of Health   Financial Resource Strain: Not on file  Food Insecurity: Not on file  Transportation Needs: Not on file  Physical Activity: Not on file  Stress: Not on file  Social Connections: Not on file  Intimate Partner Violence: Not on file     BP (!) 98/50    Pulse 96    Ht _0  (1.854 m)    Wt 184 lb (83.5 kg)    SpO2 98%    BMI 24.28 kg/m   Physical Exam:  Well appearing NAD HEENT: Unremarkable Neck:  No JVD, no thyromegally Lymphatics:  No adenopathy Back:   No CVA tenderness Lungs:  Clear with no wheezes HEART:  Regular rate rhythm, no murmurs, no rubs, no clicks Abd:  soft, positive bowel sounds, no organomegally, no rebound, no guarding Ext:  2 plus pulses, no edema, no cyanosis, no clubbing Skin:  No rashes no nodules Neuro:  CN II through XII intact, motor grossly intact  Assess/Plan:  HFREF with an EF of 20% despite GDMT - I have discussed the treatment options with the patient. ICD insertion for primary prevention of sudden death is indicated. He will call us if he wishes to proceed.  CAD - he is s/p CABG and denies anginal symptoms. No change in meds. Syncope - In the office today he passed out despite my laying him horizontally. He had loss of continence. This was a classic vagal spell brought on by anxiety over the discussion of ICD insertion. He was asked not to drive.

## 2021-03-30 DIAGNOSIS — L814 Other melanin hyperpigmentation: Secondary | ICD-10-CM | POA: Diagnosis not present

## 2021-03-30 DIAGNOSIS — D485 Neoplasm of uncertain behavior of skin: Secondary | ICD-10-CM | POA: Diagnosis not present

## 2021-03-30 DIAGNOSIS — L905 Scar conditions and fibrosis of skin: Secondary | ICD-10-CM | POA: Diagnosis not present

## 2021-03-30 DIAGNOSIS — L570 Actinic keratosis: Secondary | ICD-10-CM | POA: Diagnosis not present

## 2021-03-30 DIAGNOSIS — L821 Other seborrheic keratosis: Secondary | ICD-10-CM | POA: Diagnosis not present

## 2021-03-30 DIAGNOSIS — L820 Inflamed seborrheic keratosis: Secondary | ICD-10-CM | POA: Diagnosis not present

## 2021-03-30 DIAGNOSIS — Q825 Congenital non-neoplastic nevus: Secondary | ICD-10-CM | POA: Diagnosis not present

## 2021-03-30 DIAGNOSIS — C4442 Squamous cell carcinoma of skin of scalp and neck: Secondary | ICD-10-CM | POA: Diagnosis not present

## 2021-03-30 DIAGNOSIS — Z85828 Personal history of other malignant neoplasm of skin: Secondary | ICD-10-CM | POA: Diagnosis not present

## 2021-04-14 ENCOUNTER — Telehealth: Payer: Self-pay | Admitting: *Deleted

## 2021-04-14 ENCOUNTER — Telehealth (HOSPITAL_COMMUNITY): Payer: Self-pay | Admitting: *Deleted

## 2021-04-14 NOTE — Telephone Encounter (Signed)
RETURNED PATIENT'S PHONE CALL, SPOKE WITH PATIENT. ?

## 2021-04-14 NOTE — Telephone Encounter (Signed)
Reaching out to patient to offer assistance regarding upcoming cardiac imaging study; pt verbalizes understanding of appt date/time, parking situation and where to check in, and verified current allergies; name and call back number provided for further questions should they arise  Nicolaos Mitrano RN Navigator Cardiac Imaging [facility] [phone] office [phone] cell  Patient denies metal or claustrophobia. 

## 2021-04-15 ENCOUNTER — Other Ambulatory Visit: Payer: Self-pay

## 2021-04-15 ENCOUNTER — Ambulatory Visit (HOSPITAL_COMMUNITY)
Admission: RE | Admit: 2021-04-15 | Discharge: 2021-04-15 | Disposition: A | Discharge status: Home / Self Care | Payer: Medicare HMO | Source: Ambulatory Visit | Attending: Internal Medicine | Admitting: Internal Medicine

## 2021-04-15 DIAGNOSIS — I5189 Other ill-defined heart diseases: Secondary | ICD-10-CM | POA: Diagnosis not present

## 2021-04-15 IMAGING — MR MR CARD MORPHOLOGY WO/W CM
45 of 48 series · 45 of 48 positions shown · IV contrast (Contrast agent)
Comparison: none

CLINICAL DATA: 75M with HFrEF (20-25%), severe multivessel CAD s/p
CABG [DATE]

EXAM:
CARDIAC MRI
TECHNIQUE: The patient was scanned on a 1.5 Tesla Siemens magnet. A dedicated
cardiac coil was used. Functional imaging was done using Fiest[employer]
sequences. [DATE], and 4 chamber views were done to assess for RWMA's.
Modified XIAOMI rule using a short axis stack was used to
calculate an ejection fraction on a dedicated work station using
Circle software. The patient received 10 cc of Gadavist. After 10
minutes inversion recovery sequences were used to assess for
infiltration and scar tissue.
CONTRAST:  10 cc  of Gadavist

[Series 4: t2_haste_db_tra_bh · axial · 8.0mm · 1.41mm/px · 1 of 16 slices shown]
[im 1/16]
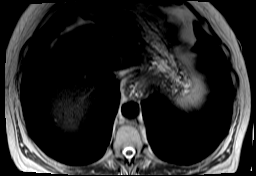

[Series 8: bSSFP · oblique · 8.0mm · 1.61mm/px · 1 of 25 slices shown (1 of 22)]
[im 1/25]
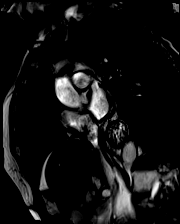

[Series 9: bSSFP · oblique · 8.0mm · 1.61mm/px · 1 of 25 slices shown (2 of 22)]
[im 1/25]
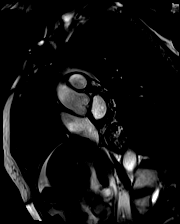

[Series 10: bSSFP · oblique · 8.0mm · 1.61mm/px · 1 of 25 slices shown (3 of 22)]
[im 1/25]
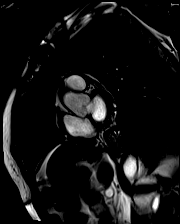

[Series 11: bSSFP · oblique · 8.0mm · 1.61mm/px · 1 of 25 slices shown (4 of 22)]
[im 1/25]
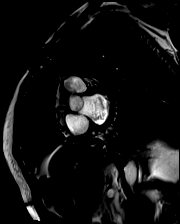

[Series 12: bSSFP · oblique · 8.0mm · 1.61mm/px · 1 of 25 slices shown (5 of 22)]
[im 1/25]
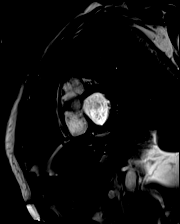

[Series 13: bSSFP · oblique · 8.0mm · 1.61mm/px · 1 of 25 slices shown (6 of 22)]
[im 1/25]
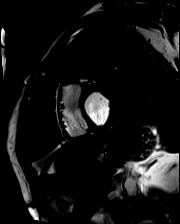

[Series 14: bSSFP · oblique · 8.0mm · 1.61mm/px · 1 of 25 slices shown (7 of 22)]
[im 1/25]
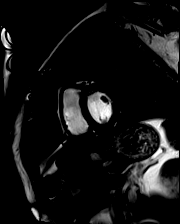

[Series 15: bSSFP · oblique · 8.0mm · 1.61mm/px · 1 of 25 slices shown (8 of 22)]
[im 1/25]
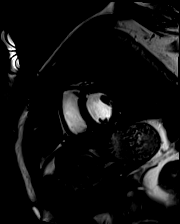

[Series 16: bSSFP · oblique · 8.0mm · 1.61mm/px · 1 of 25 slices shown (9 of 22)]
[im 1/25]
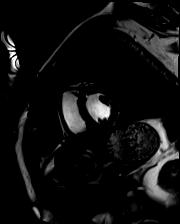

[Series 17: bSSFP · oblique · 8.0mm · 1.61mm/px · 1 of 25 slices shown (10 of 22)]
[im 1/25]
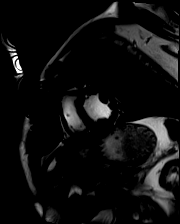

[Series 18: bSSFP · oblique · 8.0mm · 1.61mm/px · 1 of 25 slices shown (11 of 22)]
[im 1/25]
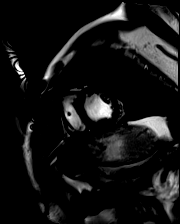

[Series 19: bSSFP · oblique · 8.0mm · 1.61mm/px · 1 of 25 slices shown (12 of 22)]
[im 1/25]
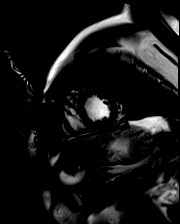

[Series 20: bSSFP · oblique · 8.0mm · 1.61mm/px · 1 of 25 slices shown (13 of 22)]
[im 1/25]
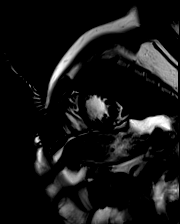

[Series 21: bSSFP · oblique · 8.0mm · 1.61mm/px · 1 of 25 slices shown (14 of 22)]
[im 1/25]
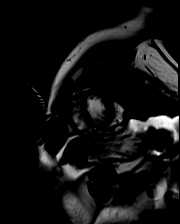

[Series 22: bSSFP · oblique · 8.0mm · 1.61mm/px · 1 of 25 slices shown (15 of 22)]
[im 1/25]
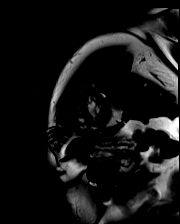

[Series 23: bSSFP · oblique · 8.0mm · 1.61mm/px · 1 of 25 slices shown (16 of 22)]
[im 1/25]
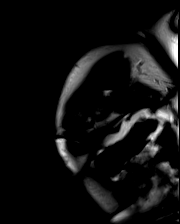

[Series 24: bSSFP · oblique · 8.0mm · 1.61mm/px · 1 of 25 slices shown (17 of 22)]
[im 1/25]
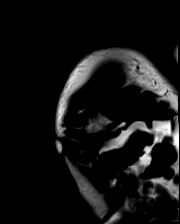

[Series 25: bSSFP · oblique · 8.0mm · 1.61mm/px · 1 of 25 slices shown (18 of 22)]
[im 1/25]
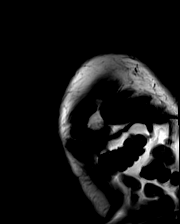

[Series 26: bSSFP · oblique · 8.0mm · 1.61mm/px · 1 of 25 slices shown (19 of 22)]
[im 1/25]
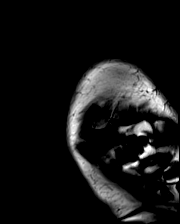

[Series 27: bSSFP · axial · 6.0mm · 1.41mm/px · 1 of 25 slices shown (20 of 22)]
[im 1/25]
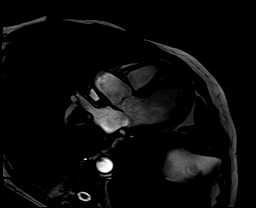

[Series 28: bSSFP · oblique · 6.0mm · 1.41mm/px · 1 of 25 slices shown (21 of 22)]
[im 1/25]
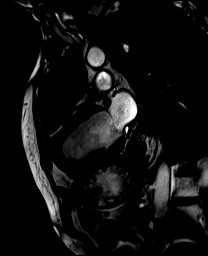

[Series 29: bSSFP · axial · 6.0mm · 1.41mm/px · 1 of 25 slices shown (22 of 22)]
[im 1/25]
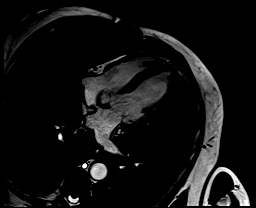

[Series 30: (id)_long_t1 · oblique · 8.0mm · 1.56mm/px · 1 of 24 slices shown]
[im 1/24]
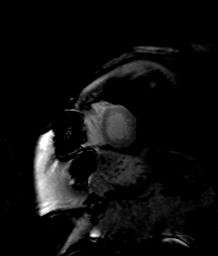

[Series 31: (id)_long_t1_moco · oblique · 8.0mm · 1.56mm/px · 1 of 24 slices shown]
[im 1/24]
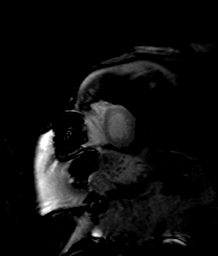

[Series 32: (id)_long_t1_moco_t1 · oblique · 8.0mm · 1.56mm/px · 1 of 6 slices shown]
[im 1/6]
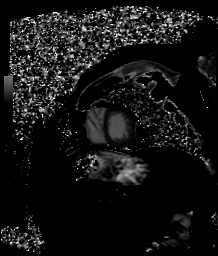

[Series 34: (id)_trufi · oblique · 8.0mm · 2.08mm/px · 1 of 9 slices shown]
[im 1/9]
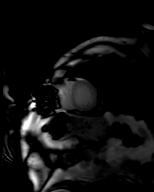

[Series 35: (id)_trufi_moco · oblique · 8.0mm · 2.08mm/px · 1 of 9 slices shown]
[im 1/9]
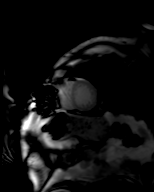

[Series 36: (id)_trufi_moco_t2 · oblique · 8.0mm · 2.08mm/px · 1 of 4 slices shown]
[im 1/4]
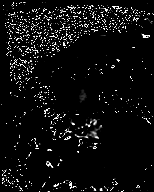

[Series 38: cine_trufi_short axis_cs_2_shot · oblique · 8.0mm · 1.48mm/px · 1 of 25 slices shown (1 of 16)]
[im 1/25]
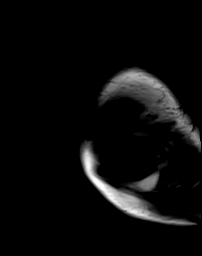

[Series 38: cine_trufi_short axis_cs_2_shot · oblique · 8.0mm · 1.48mm/px · 1 of 25 slices shown (2 of 16)]
[im 1/25]
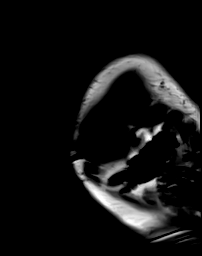

[Series 38: cine_trufi_short axis_cs_2_shot · oblique · 8.0mm · 1.48mm/px · 1 of 25 slices shown (3 of 16)]
[im 1/25]
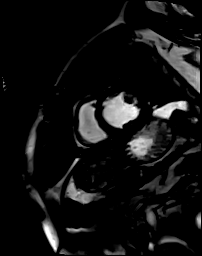

[Series 38: cine_trufi_short axis_cs_2_shot · oblique · 8.0mm · 1.48mm/px · 1 of 25 slices shown (4 of 16)]
[im 1/25]
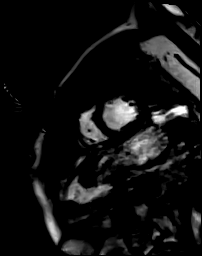

[Series 38: cine_trufi_short axis_cs_2_shot · oblique · 8.0mm · 1.48mm/px · 1 of 25 slices shown (5 of 16)]
[im 1/25]
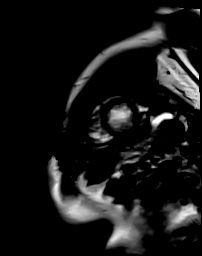

[Series 38: cine_trufi_short axis_cs_2_shot · oblique · 8.0mm · 1.48mm/px · 1 of 25 slices shown (6 of 16)]
[im 1/25]
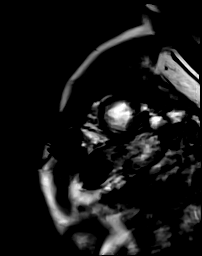

[Series 38: cine_trufi_short axis_cs_2_shot · oblique · 8.0mm · 1.48mm/px · 1 of 25 slices shown (7 of 16)]
[im 1/25]
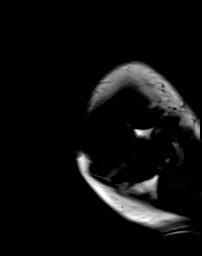

[Series 38: cine_trufi_short axis_cs_2_shot · oblique · 8.0mm · 1.48mm/px · 1 of 25 slices shown (8 of 16)]
[im 1/25]
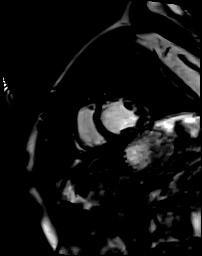

[Series 38: cine_trufi_short axis_cs_2_shot · oblique · 8.0mm · 1.48mm/px · 1 of 25 slices shown (9 of 16)]
[im 1/25]
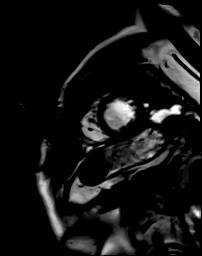

[Series 38: cine_trufi_short axis_cs_2_shot · oblique · 8.0mm · 1.48mm/px · 1 of 25 slices shown (10 of 16)]
[im 1/25]
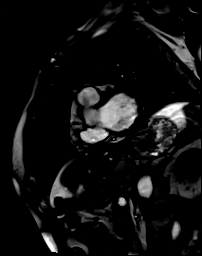

[Series 38: cine_trufi_short axis_cs_2_shot · oblique · 8.0mm · 1.48mm/px · 1 of 25 slices shown (11 of 16)]
[im 1/25]
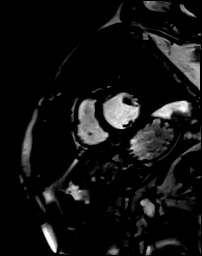

[Series 38: cine_trufi_short axis_cs_2_shot · oblique · 8.0mm · 1.48mm/px · 1 of 25 slices shown (12 of 16)]
[im 1/25]
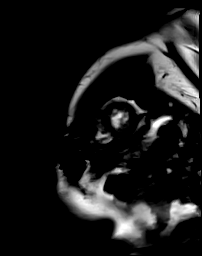

[Series 38: cine_trufi_short axis_cs_2_shot · oblique · 8.0mm · 1.48mm/px · 1 of 25 slices shown (13 of 16)]
[im 1/25]
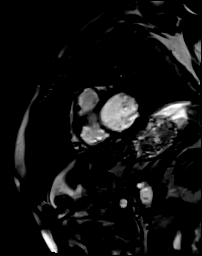

[Series 38: cine_trufi_short axis_cs_2_shot · oblique · 8.0mm · 1.48mm/px · 1 of 25 slices shown (14 of 16)]
[im 1/25]
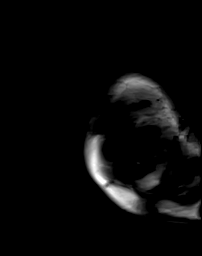

[Series 38: cine_trufi_short axis_cs_2_shot · oblique · 8.0mm · 1.48mm/px · 1 of 25 slices shown (15 of 16)]
[im 1/25]
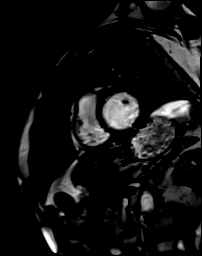

[Series 38: cine_trufi_short axis_cs_2_shot · oblique · 8.0mm · 1.48mm/px · 1 of 25 slices shown (16 of 16)]
[im 1/25]
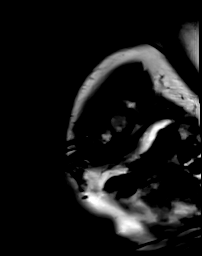

[45 of 48 positions shown; findings below may reference images not displayed]

FINDINGS: Left ventricle:

-Normal size

-Moderate systolic dysfunction

-Nonspecific ECV elevation (30%)

-RV insertion site LGE

LV EF: 35% (Normal 56-78%)

Absolute volumes:

LV EDV: 141mL (Normal 77-195 mL)

LV ESV: 92mL (Normal 19-72 mL)

LV SV: 50mL (Normal 51-133 mL)

CO: 3.8L/min (Normal 2.8-8.8 L/min)

Indexed volumes:

LV EDV: 68mL/sq-m (Normal 47-92 mL/sq-m)

LV ESV: 44mL/sq-m (Normal 13-30 mL/sq-m)

LV SV: 24mL/sq-m (Normal 32-62 mL/sq-m)

CI: 1.9L/min/sq-m (Normal 1.7-4.2 L/min/sq-m)

Right ventricle: Normal size with moderate systolic dysfunction

RV EF:  37% (Normal 47-74%)

Absolute volumes:

RV EDV: 116mL (Normal 88-227 mL)

RV ESV: 73mL (Normal 23-103 mL)

RV SV: 42mL (Normal 52-138 mL)

CO: 3.3L/min (Normal 2.8-8.8 L/min)

Indexed volumes:

RV EDV: 56mL/sq-m (Normal 55-105 mL/sq-m)

RV ESV: 35mL/sq-m (Normal 15-43 mL/sq-m)

RV SV: 20mL/sq-m (Normal 32-64 mL/sq-m)

CI: 1.6L/min/sq-m (Normal 1.7-4.2 L/min/sq-m)

Left atrium: Mild enlargement

Right atrium: Normal size

Mitral valve: Mild regurgitation

Aortic valve: No regurgitation

Tricuspid valve: Mild regurgitation

Pulmonic valve: No regurgitation

Aorta: Normal proximal ascending aorta

Pericardium: Normal

Extracardiac structures: RLL nodule, not well visualized, consider
dedicated CT chest for further evaluation
IMPRESSION: 1.  Normal LV size with moderate systolic dysfunction (EF 35%)

2.  Normal RV size with moderate systolic dysfunction (EF 37%)

3. RV insertion site late gadolinium enhancement, which is a
nonspecific finding often seen in setting of elevated pulmonary
pressures

4. Appears to be right lower lobe pulmonary nodule, not well
visualized, consider dedicated CT chest for further evaluation

## 2021-04-15 MED ORDER — GADOBUTROL 1 MMOL/ML IV SOLN
10.0000 mL | Freq: Once | INTRAVENOUS | Status: AC | PRN
  Administered 2021-04-15: 10 mL via INTRAVENOUS

## 2021-04-21 ENCOUNTER — Ambulatory Visit: Payer: Self-pay | Admitting: Urology

## 2021-04-26 ENCOUNTER — Encounter (HOSPITAL_COMMUNITY): Payer: Self-pay | Admitting: *Deleted

## 2021-04-26 ENCOUNTER — Telehealth (HOSPITAL_COMMUNITY): Payer: Self-pay | Admitting: *Deleted

## 2021-04-26 ENCOUNTER — Telehealth: Payer: Self-pay | Admitting: Internal Medicine

## 2021-04-26 ENCOUNTER — Other Ambulatory Visit: Payer: Self-pay

## 2021-04-26 DIAGNOSIS — C8330 Diffuse large B-cell lymphoma, unspecified site: Secondary | ICD-10-CM

## 2021-04-26 NOTE — Telephone Encounter (Signed)
Pt left vm requesting CMRI results.I do not see a result note from [place].  ? ?Routed to [place] and Adline Potter  ?

## 2021-04-26 NOTE — Telephone Encounter (Signed)
Left vm and sent results via mychart.  ?

## 2021-04-26 NOTE — Telephone Encounter (Signed)
Patient called and wanted to speak to Dr. Tanna Furry RN regarding getting his PPM implanted. He would like to wait until after his wife has surgery at the end of April  ?

## 2021-04-27 ENCOUNTER — Encounter: Payer: Self-pay | Admitting: Urology

## 2021-04-27 ENCOUNTER — Other Ambulatory Visit: Payer: Self-pay

## 2021-04-27 ENCOUNTER — Inpatient Hospital Stay: Payer: Medicare HMO

## 2021-04-27 ENCOUNTER — Inpatient Hospital Stay: Payer: Medicare HMO | Attending: Hematology | Admitting: Hematology

## 2021-04-27 VITALS — BP 104/60 | HR 57 | Temp 97.8°F | Wt 184.7 lb

## 2021-04-27 DIAGNOSIS — Z87891 Personal history of nicotine dependence: Secondary | ICD-10-CM | POA: Diagnosis not present

## 2021-04-27 DIAGNOSIS — Z8572 Personal history of non-Hodgkin lymphomas: Secondary | ICD-10-CM | POA: Diagnosis not present

## 2021-04-27 DIAGNOSIS — I129 Hypertensive chronic kidney disease with stage 1 through stage 4 chronic kidney disease, or unspecified chronic kidney disease: Secondary | ICD-10-CM | POA: Diagnosis not present

## 2021-04-27 DIAGNOSIS — I252 Old myocardial infarction: Secondary | ICD-10-CM | POA: Diagnosis not present

## 2021-04-27 DIAGNOSIS — Z85828 Personal history of other malignant neoplasm of skin: Secondary | ICD-10-CM | POA: Insufficient documentation

## 2021-04-27 DIAGNOSIS — I255 Ischemic cardiomyopathy: Secondary | ICD-10-CM | POA: Insufficient documentation

## 2021-04-27 DIAGNOSIS — E785 Hyperlipidemia, unspecified: Secondary | ICD-10-CM | POA: Diagnosis not present

## 2021-04-27 DIAGNOSIS — E1122 Type 2 diabetes mellitus with diabetic chronic kidney disease: Secondary | ICD-10-CM | POA: Diagnosis not present

## 2021-04-27 DIAGNOSIS — N189 Chronic kidney disease, unspecified: Secondary | ICD-10-CM | POA: Diagnosis not present

## 2021-04-27 DIAGNOSIS — C8330 Diffuse large B-cell lymphoma, unspecified site: Secondary | ICD-10-CM

## 2021-04-27 DIAGNOSIS — K219 Gastro-esophageal reflux disease without esophagitis: Secondary | ICD-10-CM | POA: Diagnosis not present

## 2021-04-27 DIAGNOSIS — Z951 Presence of aortocoronary bypass graft: Secondary | ICD-10-CM | POA: Diagnosis not present

## 2021-04-27 DIAGNOSIS — Z5111 Encounter for antineoplastic chemotherapy: Secondary | ICD-10-CM

## 2021-04-27 DIAGNOSIS — Z95828 Presence of other vascular implants and grafts: Secondary | ICD-10-CM

## 2021-04-27 LAB — CBC WITH DIFFERENTIAL (CANCER CENTER ONLY)
Abs Immature Granulocytes: 0.03 10*3/uL (ref 0.00–0.07)
Basophils Absolute: 0.1 10*3/uL (ref 0.0–0.1)
Basophils Relative: 1 %
Eosinophils Absolute: 0.1 10*3/uL (ref 0.0–0.5)
Eosinophils Relative: 1 %
HCT: 34.9 % — ABNORMAL LOW (ref 39.0–52.0)
Hemoglobin: 11.5 g/dL — ABNORMAL LOW (ref 13.0–17.0)
Immature Granulocytes: 0 %
Lymphocytes Relative: 8 %
Lymphs Abs: 0.6 10*3/uL — ABNORMAL LOW (ref 0.7–4.0)
MCH: 31.9 pg (ref 26.0–34.0)
MCHC: 33 g/dL (ref 30.0–36.0)
MCV: 96.9 fL (ref 80.0–100.0)
Monocytes Absolute: 0.9 10*3/uL (ref 0.1–1.0)
Monocytes Relative: 12 %
Neutro Abs: 5.9 10*3/uL (ref 1.7–7.7)
Neutrophils Relative %: 78 %
Platelet Count: 214 10*3/uL (ref 150–400)
RBC: 3.6 MIL/uL — ABNORMAL LOW (ref 4.22–5.81)
RDW: 12.7 % (ref 11.5–15.5)
WBC Count: 7.6 10*3/uL (ref 4.0–10.5)
nRBC: 0 % (ref 0.0–0.2)

## 2021-04-27 LAB — CMP (CANCER CENTER ONLY)
ALT: 17 U/L (ref 0–44)
AST: 18 U/L (ref 15–41)
Albumin: 4.3 g/dL (ref 3.5–5.0)
Alkaline Phosphatase: 63 U/L (ref 38–126)
Anion gap: 9 (ref 5–15)
BUN: 47 mg/dL — ABNORMAL HIGH (ref 8–23)
CO2: 23 mmol/L (ref 22–32)
Calcium: 9.7 mg/dL (ref 8.9–10.3)
Chloride: 108 mmol/L (ref 98–111)
Creatinine: 2.02 mg/dL — ABNORMAL HIGH (ref 0.61–1.24)
GFR, Estimated: 33 mL/min — ABNORMAL LOW (ref >60)
Glucose, Bld: 103 mg/dL — ABNORMAL HIGH (ref 70–99)
Potassium: 4.4 mmol/L (ref 3.5–5.1)
Sodium: 140 mmol/L (ref 135–145)
Total Bilirubin: 0.7 mg/dL (ref 0.3–1.2)
Total Protein: 7.4 g/dL (ref 6.5–8.1)

## 2021-04-27 LAB — LACTATE DEHYDROGENASE: LDH: 163 U/L (ref 98–192)

## 2021-04-27 MED ORDER — SODIUM CHLORIDE 0.9% FLUSH
10.0000 mL | Freq: Once | INTRAVENOUS | Status: AC
  Administered 2021-04-27: 10 mL

## 2021-04-27 MED ORDER — HEPARIN SOD (PORK) LOCK FLUSH 100 UNIT/ML IV SOLN
500.0000 [IU] | Freq: Once | INTRAVENOUS | Status: AC
  Administered 2021-04-27: 500 [IU]

## 2021-04-27 NOTE — Progress Notes (Signed)
Telephone follow-up. I verified patient identity and began nursing interview by phone w/ spouse Rolin Schult in attendance. Patient is doing well overall. No issues reported at this time. ? ?Meaningful use complete. ? ?Patient reminded of his 11:30am-04/28/21 telephone appointment w/ Ashlyn Bruning PA-C. I left my extension [phone] in case patient needs anything. Patient verbalized understanding of information. ? ?Patient contact [phone] ?

## 2021-04-27 NOTE — Progress Notes (Signed)
. ? ? ? ?HEMATOLOGY/ONCOLOGY CLINIC NOTE ? ?Date of Service: 04/27/2021 ? ? ?Patient Care Team: ?[name], DO as PCP - General (Family Medicine) ?Geralynn Rile, MD as PCP - Cardiology (Cardiology) ? 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 ?HISTORY OF PRESENTING ILLNESS:  ? ?Please see previous notes for details on initial presentation ? 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 He presents today accompanied by his wife. He reports He is doing well with no new symptoms or concerns. ? ?His wife notes that he can not put his head below his knees. He further notes that he feels dizzy if he bends below his knees. He reports that if he stands up too quickly then he will feel dizzy. He expresses that he is able bend over and touch his toes without feeling dizzy or light headed. ? ?He reports that his radiation went well with only minimal peeling and sensitivity. ? ?He notes that he had some diarrhea last night and this morning. ? ?He reports that he has gained some weight and has lost some fluid retention. ? ?He notes no issues with walking. ? ?He had 3 basal cell carcinomas removed. ? ?No fever, chills, night sweats. ?No new lumps, bumps, or lesions/rashes. ?No change in bowel habits. ?No new or unexpected weight loss. ?No SOB or chest pain. ?No leg swelling ?No testicular pain or swelling. ?No new abdominal pain or distention. ?No other new or acute focal symptoms. ? ?Labs done today reviewed with the patient. ?CBC stable. Hemoglobin improving at 11.5, HCT improving at 34.9%, and RBC is improving slightly at 3.60. ? ?MEDICAL HISTORY:  ?Past Medical History:  ?Diagnosis Date  ? Allergic rhinitis   ? 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 Cardiomyopathy, nonischemic ([place])   ? followed by  cardiology--- dr [name]---  2016 ef 45-50% ,  2016 nuclear ef 37%,  2017 per echo ef 50-55%  ? CHF (congestive heart failure), NYHA class III ([place]) 03/21/2020  ? Coronary artery disease   ? GERD (gastroesophageal reflux disease)   ? Hiatal hernia   ? History of kidney stones   ? History of squamous cell carcinoma excision   ? 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;   01/ 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  ? History of syncope (03-06-2020 pt stated has not had sycopal episode in few years, stated it seems to happen in extreme hot conditions)  ? cardiologist--- dr [name]--- dx recurrent syncope;  nuclear study 11-03-2014 intermediate risk w/ no ischemia, apical hypokinesis, nuclear ef 37%;  event monitor-- 12-21-2015 SB/ ST  no pauses/ arrythmia's;  echo 08-03-2015 ef 50-55%  ? Hyperlipidemia   ? Hypertension   ? followed by pcp  ? Hypovitaminosis D   ? Mass of left testicle   ? Nocturia   ? Plantar fasciitis   ? Presence of surgical incision   ? 01/ 2022  moh's w/ skin graft of nose, per pt still healing and wear bandage daily  ? Type 2 diabetes mellitus ([place])   ? pt is adament that he is not and have been told he is a diabetic but a borderline;  followed by pcp, in pcp note states DM2 and takes 2 meds daily  ? Wears glasses   ? Wears hearing aid in both ears   ?  Weight loss 11/16/2020  ? ? ?SURGICAL HISTORY: ?Past Surgical History:  ?Procedure Laterality Date  ? COLONOSCOPY  last one 01-30-2017  ? CORONARY ARTERY BYPASS GRAFT N/A 03/25/2020  ? Procedure: CORONARY ARTERY BYPASS GRAFTING (CABG), ON PUMP, TIMES FOUR, USING BILATERAL INTERNAL MAMMARY ARTERIES AND LEFT RADIAL ARTERY;  Surgeon: [name], MD;  Location: [place];  Service: Open Heart Surgery;  Laterality: N/A;  ? IR IMAGING GUIDED PORT INSERTION  07/06/2020  ? LOW ANTERIOR RESECTION RECTUM W/ COLOPROCTOSTOMY  05/2003  ? MOHS SURGERY  01/2020  ? nose w/ graft  ? ORCHIECTOMY Left 03/11/2020  ? Procedure: [place] [facility];  Surgeon: [name], MD;  Location[facility];  Service: Urology;  Laterality: Left;  ONLY NEEDS 60 MIN  ? RADIAL ARTERY HARVEST Left 03/25/2020  ? Procedure: LEFT RADIAL ARTERY HARVEST;  Surgeon: [name], MD;  Location: [facility];  Service: Open Heart Surgery;  Laterality: Left;  ? RIGHT/LEFT HEART CATH AND CORONARY ANGIOGRAPHY N/A 03/23/2020  ? Procedure: RIGHT/LEFT HEART CATH AND CORONARY ANGIOGRAPHY;  Surgeon: [name], MD;  Location: [facility];  Service: Cardiovascular;  Laterality: N/A;  ? SHOULDER SURGERY Right 1992; 07/ 2021  ? 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 left ear and nose   ? TEE WITHOUT CARDIOVERSION N/A 03/25/2020  ? Procedure: TRANSESOPHAGEAL ECHOCARDIOGRAM (TEE);  Surgeon: [name], MD;  Location: [facility];  Service: Open Heart Surgery;  Laterality: N/A;  ? TONSILLECTOMY AND ADENOIDECTOMY  child  ? UPPER GASTROINTESTINAL ENDOSCOPY  last one 06-06-2017  ? ? ?SOCIAL HISTORY: ?Social History  ? ?Socioeconomic History  ? Marital status: Married  ?  Spouse name: Not on file  ? Number of children: Not on file  ? Years of education: Not on file  ? Highest education level: Not on file  ?Occupational History  ? Not on file  ?Tobacco Use  ? Smoking status: Former  ?  Types: Pipe  ?  Quit date: 11/29/1976  ?  Years since quitting: 44.4  ? Smokeless tobacco: Never  ?Vaping Use  ? Vaping Use: Never used  ?Substance and Sexual Activity  ? Alcohol use: Yes  ?  Alcohol/week: 0.0 standard drinks  ?  Comment: Occasional drink   ? Drug use: Never  ? Sexual activity: Not on file  ?Other Topics Concern  ? Not on file  ?Social History Narrative  ? Not on file  ? ?Social Determinants of Health  ? ?Financial Resource Strain: Not on file  ?Food Insecurity: Not on file  ?Transportation Needs: Not on file  ?Physical Activity: Not on file  ?Stress: Not on file  ?Social Connections: Not on file  ?Intimate Partner Violence: Not on file  ? ? ?FAMILY  HISTORY: ?Family History  ?Problem Relation Age of Onset  ? Heart attack Mother   ? Stroke Father   ? Colon cancer Neg Hx   ? Esophageal cancer Neg Hx   ? Pancreatic cancer Neg Hx   ? Rectal cancer Neg Hx   ? Stomach cancer Neg Hx   ? Colon polyps Neg Hx   ? ? ?ALLERGIES:  has No Known Allergies. ? ?MEDICATIONS:  ?Current Outpatient Medications  ?Medication Sig Dispense Refill  ? ACCU-CHEK GUIDE test strip     ? Apoaequorin (PREVAGEN PO) Take 1 tablet by mouth at bedtime.    ? aspirin EC 81 MG tablet Take 81 mg by mouth daily. Swallow  whole.    ? atorvastatin (LIPITOR) 40 MG tablet Take 1 tablet (40 mg total) by mouth daily. 90 tablet 3  ? B-Complex TABS See admin instructions.    ? Blood Glucose Monitoring Suppl (ACCU-CHEK GUIDE ME) w/Device KIT     ? cholecalciferol (VITAMIN D) 1000 UNITS tablet Take 2,000 Units by mouth See admin instructions. Mon-friday    ? empagliflozin (JARDIANCE) 10 MG TABS tablet Take 1 tablet (10 mg total) by mouth daily before breakfast. 90 tablet 3  ? glucosamine-chondroitin 500-400 MG tablet Take 1 tablet by mouth. 5 times weekly    ? Lancets 33G MISC See admin instructions.    ? LORazepam (ATIVAN) 0.5 MG tablet Take 1 tablet (0.5 mg total) by mouth every 6 (six) hours as needed for anxiety. Put 1 pill under your tongue every 6 hrs, if needed, for nausea 30 tablet 0  ? LORazepam (ATIVAN) 0.5 MG tablet 1 tablet at bedtime as needed    ? metFORMIN (GLUCOPHAGE) 1000 MG tablet Take 1,000 mg by mouth 2 (two) times daily with a meal.     ? metoprolol (TOPROL XL) 200 MG 24 hr tablet Take 1 tablet (200 mg total) by mouth daily. 90 tablet 3  ? Misc Natural Products (GLUCOSAMINE CHOND COMPLEX/MSM) TABS See admin instructions.    ? Multiple Vitamin (MULTIVITAMIN WITH MINERALS) TABS tablet Take 1 tablet by mouth. 5 times weekly    ? omeprazole (PRILOSEC) 20 MG capsule Take 20 mg by mouth daily.    ? ondansetron (ZOFRAN) 8 MG tablet Take 1 tablet (8 mg total) by mouth every 8 (eight) hours as needed  for nausea or vomiting. Take 1 pill every 8 hrs for 3 days. 30 tablet 3  ? ondansetron (ZOFRAN-ODT) 8 MG disintegrating tablet 1 tablet on the tongue and allow to dissolve  as needed    ? potassium chloride SA (KLOR-C

## 2021-04-28 ENCOUNTER — Ambulatory Visit
Admission: RE | Admit: 2021-04-28 | Discharge: 2021-04-28 | Disposition: A | Discharge status: Home / Self Care | Payer: Medicare HMO | Source: Ambulatory Visit | Attending: Urology | Admitting: Urology

## 2021-04-28 DIAGNOSIS — C8339 Diffuse large B-cell lymphoma, extranodal and solid organ sites: Secondary | ICD-10-CM

## 2021-04-28 NOTE — Progress Notes (Signed)
?Radiation Oncology         [phone] ?________________________________ ? ?Name: Roy [name] MRN: [id number]  ?Date: 04/28/2021  DOB: 1945-02-26 ? ?Post Treatment Note ? ?CC: [name], DO  Brunetta Genera, MD ? 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 ?Interval Since Last Radiation:  5 weeks  ?03/01/21 - 03/23/21:  The right testicle was treated to 30.6 Gy in 17 fractions of 1.8 Gy for purpose of prophylaxis/cure. ? ?Narrative:  I spoke with the patient to conduct his routine scheduled 1 month follow up visit via telephone to spare the patient unnecessary potential exposure in the healthcare setting during the current COVID-19 pandemic.  The patient was notified in advance and gave permission to proceed with this visit format. ? ?He tolerated radiation treatment relatively well.   He did experience some mild skin irritation with erythema, dry skin and peeling which was managed with Sonafine as needed.                             ? ?On review of systems, the patient states that he is doing very well in general.  The skin irritation has completely resolved and he denies any other bothersome side effects.  He reports a healthy appetite and is maintaining his weight.  He denies any significant fatigue and overall, is quite pleased with his progress to date. ? ?ALLERGIES:  has No Known Allergies. ? ?Meds: ?Current Outpatient Medications  ?Medication Sig Dispense Refill  ? ACCU-CHEK GUIDE test strip     ? Apoaequorin (PREVAGEN PO) Take 1 tablet by mouth at bedtime.    ? aspirin EC 81 MG tablet Take 81 mg by mouth daily. Swallow whole.    ? atorvastatin (LIPITOR) 40 MG tablet Take 1 tablet (40 mg total) by mouth daily. 90 tablet 3  ? B-Complex TABS See admin instructions.    ? Blood Glucose Monitoring Suppl (ACCU-CHEK GUIDE ME)  w/Device KIT     ? cholecalciferol (VITAMIN D) 1000 UNITS tablet Take 2,000 Units by mouth See admin instructions. Mon-friday    ? empagliflozin (JARDIANCE) 10 MG TABS tablet Take 1 tablet (10 mg total) by mouth daily before breakfast. 90 tablet 3  ? glucosamine-chondroitin 500-400 MG tablet Take 1 tablet by mouth. 5 times weekly    ? Lancets 33G MISC See admin instructions.    ? LORazepam (ATIVAN) 0.5 MG tablet Take 1 tablet (0.5 mg total) by mouth every 6 (six) hours as needed for anxiety. Put 1 pill under your tongue every 6 hrs, if needed, for nausea 30 tablet 0  ? LORazepam (ATIVAN) 0.5 MG tablet 1 tablet at bedtime as needed    ? metFORMIN (GLUCOPHAGE) 1000 MG tablet Take 1,000 mg by mouth 2 (two) times daily with a meal.     ? metoprolol (TOPROL XL) 200 MG 24 hr tablet Take 1 tablet (200 mg total) by mouth daily. 90 tablet 3  ? Misc Natural Products (GLUCOSAMINE CHOND COMPLEX/MSM) TABS See admin instructions.    ? Multiple Vitamin (MULTIVITAMIN WITH MINERALS) TABS tablet Take 1 tablet by mouth. 5 times weekly    ? omeprazole (PRILOSEC) 20 MG capsule Take 20 mg by mouth daily.    ? ondansetron (ZOFRAN) 8 MG tablet Take 1 tablet (8 mg total)  by mouth every 8 (eight) hours as needed for nausea or vomiting. Take 1 pill every 8 hrs for 3 days. 30 tablet 3  ? ondansetron (ZOFRAN-ODT) 8 MG disintegrating tablet 1 tablet on the tongue and allow to dissolve  as needed    ? potassium chloride SA (KLOR-CON M20) 20 MEQ tablet Take 1 tablet (20 mEq total) by mouth daily. 90 tablet 3  ? prochlorperazine (COMPAZINE) 10 MG tablet Take 1 tablet (10 mg total) by mouth every 8 (eight) hours as needed for nausea or vomiting. 40 tablet 2  ? prochlorperazine (COMPAZINE) 10 MG tablet See admin instructions.    ? sacubitril-valsartan (ENTRESTO) 24-26 MG TAKE 1 TABLET BY MOUTH 2 TIMES DAILY. 180 tablet 3  ? spironolactone (ALDACTONE) 25 MG tablet TAKE 1/2 TABLET BY MOUTH DAILY. 135 tablet 1  ? tamsulosin (FLOMAX) 0.4 MG CAPS capsule  Take 0.4 mg by mouth at bedtime.    ? torsemide (DEMADEX) 20 MG tablet Take 20 mg by mouth daily.    ? vitamin B-12 (CYANOCOBALAMIN) 1000 MCG tablet Take 1,000 mcg by mouth. 5 times a week    ? ?Current Facility-Administered Medications  ?Medication Dose Route Frequency Provider Last Rate Last Admin  ? potassium chloride (KLOR-CON) CR tablet 20 mEq  20 mEq Oral BID [name], MD      ? ? ?Physical Findings: ? vitals were not taken for this visit.  ?Pain Assessment ?Pain Score: 0-No pain/10 ?Unable to assess due to telephone follow-up visit format. ? ?Lab Findings: ?Lab Results  ?Component Value Date  ? WBC 7.6 04/27/2021  ? HGB 11.5 (L) 04/27/2021  ? HCT 34.9 (L) 04/27/2021  ? MCV 96.9 04/27/2021  ? PLT 214 04/27/2021  ? ? ? ?Radiographic Findings: ?MR CARDIAC MORPHOLOGY W WO CONTRAST ? 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 A dedicated cardiac coil was used. Functional imaging was done using Fiesta sequences. 2,3, and 4 chamber views were done to assess for RWMA's. Modified Simpson's rule using a short axis stack was used to calculate an ejection fraction on a dedicated work Conservation officer, nature. The patient received 10 cc of Gadavist. After 10 minutes inversion recovery sequences were used to assess for infiltration and scar tissue. CONTRAST:  10 cc  of Gadavist FINDINGS: Left ventricle: -Normal size -Moderate systolic dysfunction -Nonspecific ECV elevation (30%) -RV insertion site LGE LV EF: 35% (Normal 56-78%) Absolute volumes: LV EDV: 186mL (Normal 77-195 mL) LV ESV: 15mL (Normal 19-72 mL) LV SV: 30mL (Normal 51-133 mL) CO: 3.8L/min (Normal 2.8-8.8 L/min) Indexed volumes: LV EDV: 65mL/sq-m (Normal 47-92 mL/sq-m) LV ESV: 31mL/sq-m (Normal 13-30 mL/sq-m) LV SV: 71mL/sq-m (Normal 32-62 mL/sq-m) CI: 1.9L/min/sq-m (Normal 1.7-4.2 L/min/sq-m) Right  ventricle: Normal size with moderate systolic dysfunction RV EF:  37% (Normal 47-74%) Absolute volumes: RV EDV: 124mL (Normal 88-227 mL) RV ESV: 54mL (Normal 23-103 mL) RV SV: 43mL (Normal 52-138 mL) CO: 3.3L/min (Normal 2.8-8.8 L/min) Indexed volumes: RV EDV: 53mL/sq-m (Normal 55-105 mL/sq-m) RV ESV: 88mL/sq-m (Normal 15-43 mL/sq-m) RV SV: 67mL/sq-m (Normal 32-64 mL/sq-m) CI: 1.6L/min/sq-m (Normal 1.7-4.2 L/min/sq-m) Left atrium: Mild enlargement Right atrium: Normal size Mitral valve: Mild regurgitation Aortic valve: No regurgitation Tricuspid valve: Mild regurgitation Pulmonic valve: No regurgitation Aorta: Normal proximal ascending aorta Pericardium: Normal Extracardiac structures: RLL nodule, not well visualized, consider dedicated CT chest for further evaluation IMPRESSION: 1.  Normal LV  size with moderate systolic dysfunction (EF 77%) 2.  Normal RV size with moderate systolic dysfunction (EF 93%) 3. RV insertion site late gadolinium enhancement, which is a nonspecific finding often seen in setting of elevated pulmonary pressures 4. Appears to be right lower lobe pulmonary nodule, not well visualized, consider dedicated CT chest for further evaluation Electronically Signed   By: Oswaldo Milian M.D.   On: 04/15/2021 19:47   ? ?Impression/Plan: ?31. 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 ?The patient appears to have recovered well from the effects of his recent radiotherapy and is currently without complaints.  We discussed that while we are happy to continue to participate in his care if clinically indicated, at this point, we will plan to see him back on an as-needed basis.  He knows that he is welcome to call at anytime with any questions or concerns related to his previous radiation.  Otherwise, he will continue in routine follow-up  under the care and direction of Dr. Irene Limbo for continued management of his systemic disease. ? ? ? ?Nicholos Johns, PA-C  ?

## 2021-04-28 NOTE — Telephone Encounter (Signed)
Returned call to Pt. ? ?Per Pt his wife is having knee replacement surgery the end of April.  He wants to wait on ICD implant until she is mobile. ? ?He anticipates will be ready for implant end of May/early June. ? ?He will call this nurse back when he has a better idea of when  he can schedule. ? ?Confirmed plan was for ICD implant with port removal. ? ?

## 2021-04-29 ENCOUNTER — Telehealth: Payer: Self-pay | Admitting: Hematology

## 2021-04-29 NOTE — Telephone Encounter (Signed)
Scheduled follow-up appointments per 4/4 los. Patient is aware. ?

## 2021-05-03 ENCOUNTER — Encounter: Payer: Self-pay | Admitting: Hematology

## 2021-05-12 DIAGNOSIS — E7801 Familial hypercholesterolemia: Secondary | ICD-10-CM | POA: Diagnosis not present

## 2021-05-12 DIAGNOSIS — D509 Iron deficiency anemia, unspecified: Secondary | ICD-10-CM | POA: Diagnosis not present

## 2021-05-12 DIAGNOSIS — Z125 Encounter for screening for malignant neoplasm of prostate: Secondary | ICD-10-CM | POA: Diagnosis not present

## 2021-05-12 DIAGNOSIS — E1159 Type 2 diabetes mellitus with other circulatory complications: Secondary | ICD-10-CM | POA: Diagnosis not present

## 2021-05-19 DIAGNOSIS — K219 Gastro-esophageal reflux disease without esophagitis: Secondary | ICD-10-CM | POA: Diagnosis not present

## 2021-05-19 DIAGNOSIS — Z0000 Encounter for general adult medical examination without abnormal findings: Secondary | ICD-10-CM | POA: Diagnosis not present

## 2021-05-19 DIAGNOSIS — E1159 Type 2 diabetes mellitus with other circulatory complications: Secondary | ICD-10-CM | POA: Diagnosis not present

## 2021-05-19 DIAGNOSIS — N1832 Chronic kidney disease, stage 3b: Secondary | ICD-10-CM | POA: Diagnosis not present

## 2021-05-19 DIAGNOSIS — D509 Iron deficiency anemia, unspecified: Secondary | ICD-10-CM | POA: Diagnosis not present

## 2021-05-19 DIAGNOSIS — I5189 Other ill-defined heart diseases: Secondary | ICD-10-CM | POA: Diagnosis not present

## 2021-05-19 DIAGNOSIS — Z23 Encounter for immunization: Secondary | ICD-10-CM | POA: Diagnosis not present

## 2021-05-19 DIAGNOSIS — I129 Hypertensive chronic kidney disease with stage 1 through stage 4 chronic kidney disease, or unspecified chronic kidney disease: Secondary | ICD-10-CM | POA: Diagnosis not present

## 2021-05-19 DIAGNOSIS — I428 Other cardiomyopathies: Secondary | ICD-10-CM | POA: Diagnosis not present

## 2021-05-24 ENCOUNTER — Other Ambulatory Visit: Payer: Self-pay | Admitting: Cardiovascular Disease

## 2021-06-07 ENCOUNTER — Other Ambulatory Visit: Payer: Self-pay | Admitting: Cardiovascular Disease

## 2021-06-08 ENCOUNTER — Inpatient Hospital Stay: Payer: Medicare HMO | Attending: Hematology

## 2021-06-08 DIAGNOSIS — Z8572 Personal history of non-Hodgkin lymphomas: Secondary | ICD-10-CM | POA: Insufficient documentation

## 2021-06-08 DIAGNOSIS — Z95828 Presence of other vascular implants and grafts: Secondary | ICD-10-CM

## 2021-06-08 DIAGNOSIS — Z452 Encounter for adjustment and management of vascular access device: Secondary | ICD-10-CM | POA: Insufficient documentation

## 2021-06-08 MED ORDER — SODIUM CHLORIDE 0.9% FLUSH
10.0000 mL | Freq: Once | INTRAVENOUS | Status: AC
  Administered 2021-06-08: 10 mL

## 2021-06-08 MED ORDER — HEPARIN SOD (PORK) LOCK FLUSH 100 UNIT/ML IV SOLN
500.0000 [IU] | Freq: Once | INTRAVENOUS | Status: AC
  Administered 2021-06-08: 500 [IU]

## 2021-06-14 DIAGNOSIS — C6212 Malignant neoplasm of descended left testis: Secondary | ICD-10-CM | POA: Diagnosis not present

## 2021-06-14 DIAGNOSIS — R3912 Poor urinary stream: Secondary | ICD-10-CM | POA: Diagnosis not present

## 2021-06-14 DIAGNOSIS — R351 Nocturia: Secondary | ICD-10-CM | POA: Diagnosis not present

## 2021-06-14 DIAGNOSIS — N401 Enlarged prostate with lower urinary tract symptoms: Secondary | ICD-10-CM | POA: Diagnosis not present

## 2021-07-05 DIAGNOSIS — Z85828 Personal history of other malignant neoplasm of skin: Secondary | ICD-10-CM | POA: Diagnosis not present

## 2021-07-05 DIAGNOSIS — L821 Other seborrheic keratosis: Secondary | ICD-10-CM | POA: Diagnosis not present

## 2021-07-05 DIAGNOSIS — L570 Actinic keratosis: Secondary | ICD-10-CM | POA: Diagnosis not present

## 2021-07-09 ENCOUNTER — Telehealth: Payer: Self-pay | Admitting: Internal Medicine

## 2021-07-09 DIAGNOSIS — I428 Other cardiomyopathies: Secondary | ICD-10-CM

## 2021-07-09 NOTE — Telephone Encounter (Signed)
Patient called and wanted to talk with Dr. [name] about getting a defib and they discussed previously in march. Please call back

## 2021-07-14 NOTE — Telephone Encounter (Signed)
Returned call to Pt.  Addressed remaining questions regarding ICD insert.  Gave upcoming dates in July available for procedure.  He will discuss with wife and return call to nurse to confirm surgical date.  PLEASE SEND CALL TO NURSE.

## 2021-07-14 NOTE — Telephone Encounter (Signed)
Returned call to Pt.  Scheduled for ICD implant and port removal on August 23, 2021  Will get lab work on July 11 and meet Pt to go over instructions and give soap  Work up complete.

## 2021-07-14 NOTE — Telephone Encounter (Signed)
Patient returned call to RN.  He stated he has the information she requested.

## 2021-07-23 ENCOUNTER — Other Ambulatory Visit: Payer: Self-pay

## 2021-07-23 DIAGNOSIS — C8330 Diffuse large B-cell lymphoma, unspecified site: Secondary | ICD-10-CM

## 2021-07-26 ENCOUNTER — Other Ambulatory Visit: Payer: Self-pay

## 2021-07-26 ENCOUNTER — Inpatient Hospital Stay: Payer: Medicare HMO

## 2021-07-26 ENCOUNTER — Inpatient Hospital Stay: Payer: Medicare HMO | Attending: Hematology | Admitting: Hematology

## 2021-07-26 VITALS — BP 108/65 | HR 74 | Temp 97.5°F | Resp 17 | Wt 196.6 lb

## 2021-07-26 DIAGNOSIS — K219 Gastro-esophageal reflux disease without esophagitis: Secondary | ICD-10-CM | POA: Diagnosis not present

## 2021-07-26 DIAGNOSIS — Z85828 Personal history of other malignant neoplasm of skin: Secondary | ICD-10-CM | POA: Diagnosis not present

## 2021-07-26 DIAGNOSIS — I10 Essential (primary) hypertension: Secondary | ICD-10-CM | POA: Diagnosis not present

## 2021-07-26 DIAGNOSIS — Z9221 Personal history of antineoplastic chemotherapy: Secondary | ICD-10-CM | POA: Insufficient documentation

## 2021-07-26 DIAGNOSIS — C8330 Diffuse large B-cell lymphoma, unspecified site: Secondary | ICD-10-CM

## 2021-07-26 DIAGNOSIS — I252 Old myocardial infarction: Secondary | ICD-10-CM | POA: Insufficient documentation

## 2021-07-26 DIAGNOSIS — Z8572 Personal history of non-Hodgkin lymphomas: Secondary | ICD-10-CM | POA: Diagnosis not present

## 2021-07-26 DIAGNOSIS — I255 Ischemic cardiomyopathy: Secondary | ICD-10-CM | POA: Diagnosis not present

## 2021-07-26 DIAGNOSIS — Z9079 Acquired absence of other genital organ(s): Secondary | ICD-10-CM | POA: Diagnosis not present

## 2021-07-26 DIAGNOSIS — N189 Chronic kidney disease, unspecified: Secondary | ICD-10-CM | POA: Insufficient documentation

## 2021-07-26 DIAGNOSIS — E119 Type 2 diabetes mellitus without complications: Secondary | ICD-10-CM | POA: Insufficient documentation

## 2021-07-26 DIAGNOSIS — E785 Hyperlipidemia, unspecified: Secondary | ICD-10-CM | POA: Diagnosis not present

## 2021-07-26 DIAGNOSIS — Z95828 Presence of other vascular implants and grafts: Secondary | ICD-10-CM

## 2021-07-26 LAB — CMP (CANCER CENTER ONLY)
ALT: 12 U/L (ref 0–44)
AST: 13 U/L — ABNORMAL LOW (ref 15–41)
Albumin: 4.2 g/dL (ref 3.5–5.0)
Alkaline Phosphatase: 64 U/L (ref 38–126)
Anion gap: 9 (ref 5–15)
BUN: 37 mg/dL — ABNORMAL HIGH (ref 8–23)
CO2: 25 mmol/L (ref 22–32)
Calcium: 9.2 mg/dL (ref 8.9–10.3)
Chloride: 107 mmol/L (ref 98–111)
Creatinine: 2.17 mg/dL — ABNORMAL HIGH (ref 0.61–1.24)
GFR, Estimated: 31 mL/min — ABNORMAL LOW (ref >60)
Glucose, Bld: 96 mg/dL (ref 70–99)
Potassium: 4.4 mmol/L (ref 3.5–5.1)
Sodium: 141 mmol/L (ref 135–145)
Total Bilirubin: 0.7 mg/dL (ref 0.3–1.2)
Total Protein: 6.9 g/dL (ref 6.5–8.1)

## 2021-07-26 LAB — CBC WITH DIFFERENTIAL (CANCER CENTER ONLY)
Abs Immature Granulocytes: 0.03 10*3/uL (ref 0.00–0.07)
Basophils Absolute: 0.1 10*3/uL (ref 0.0–0.1)
Basophils Relative: 1 %
Eosinophils Absolute: 0.2 10*3/uL (ref 0.0–0.5)
Eosinophils Relative: 2 %
HCT: 33.9 % — ABNORMAL LOW (ref 39.0–52.0)
Hemoglobin: 11.6 g/dL — ABNORMAL LOW (ref 13.0–17.0)
Immature Granulocytes: 0 %
Lymphocytes Relative: 10 %
Lymphs Abs: 0.9 10*3/uL (ref 0.7–4.0)
MCH: 32.4 pg (ref 26.0–34.0)
MCHC: 34.2 g/dL (ref 30.0–36.0)
MCV: 94.7 fL (ref 80.0–100.0)
Monocytes Absolute: 0.8 10*3/uL (ref 0.1–1.0)
Monocytes Relative: 9 %
Neutro Abs: 7.1 10*3/uL (ref 1.7–7.7)
Neutrophils Relative %: 78 %
Platelet Count: 225 10*3/uL (ref 150–400)
RBC: 3.58 MIL/uL — ABNORMAL LOW (ref 4.22–5.81)
RDW: 12.1 % (ref 11.5–15.5)
WBC Count: 9 10*3/uL (ref 4.0–10.5)
nRBC: 0 % (ref 0.0–0.2)

## 2021-07-26 LAB — LACTATE DEHYDROGENASE: LDH: 115 U/L (ref 98–192)

## 2021-07-26 MED ORDER — HEPARIN SOD (PORK) LOCK FLUSH 100 UNIT/ML IV SOLN
500.0000 [IU] | Freq: Once | INTRAVENOUS | Status: AC
  Administered 2021-07-26: 500 [IU]

## 2021-07-26 MED ORDER — SODIUM CHLORIDE 0.9% FLUSH
10.0000 mL | Freq: Once | INTRAVENOUS | Status: AC
  Administered 2021-07-26: 10 mL

## 2021-07-26 NOTE — Progress Notes (Signed)
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 He reports He is doing well with no new symptoms or concerns.  He reports he is getting an implantable defibrillator 08/23/2021.   No fever, chills, night sweats. No new lumps, bumps, or lesions/rashes. No change in bowel habits. No new or unexpected weight loss. No SOB or chest pain. No leg swelling No testicular pain or swelling. No new abdominal pain or distention. No other new or acute focal symptoms.  Labs done today reviewed with the patient.  MEDICAL HISTORY:  Past Medical History:  Diagnosis Date   Allergic rhinitis    Arthritis    wrists   Cardiomyopathy, nonischemic [facility])    followed by cardiology--- dr [name]---  2016 ef 45-50% ,  2016 nuclear ef 37%,  2017 per echo ef 50-55%   CHF (congestive heart failure), NYHA class III ([place]) 03/21/2020   Coronary artery disease    GERD (gastroesophageal reflux disease)    Hiatal hernia    History of kidney stones    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;   01/ 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   History of syncope (03-06-2020 pt stated has not had sycopal episode in few years, stated it seems to happen in extreme hot conditions)   cardiologist--- dr [name]--- dx recurrent syncope;  nuclear study 11-03-2014 intermediate risk w/ no ischemia, apical hypokinesis, nuclear ef  37%;  event monitor-- 12-21-2015 SB/ ST  no pauses/ arrythmia's;  echo 08-03-2015 ef 50-55%   Hyperlipidemia    Hypertension    followed by pcp   Hypovitaminosis D    Mass of left testicle    Nocturia    Plantar fasciitis    Presence of surgical incision    01/ 2022  moh's w/ skin graft of nose, per pt still healing and wear bandage daily   Type 2 diabetes mellitus ([place])    pt is adament that he is not and have been told he is a diabetic but a borderline;  followed by pcp, in pcp note states DM2 and takes 2 meds daily   Wears glasses    Wears hearing aid in both ears    Weight loss 11/16/2020    SURGICAL HISTORY: Past Surgical History:  Procedure Laterality Date   COLONOSCOPY  last one 01-30-2017   CORONARY ARTERY BYPASS GRAFT N/A 03/25/2020   Procedure: CORONARY ARTERY BYPASS GRAFTING (CABG), ON PUMP, TIMES FOUR, USING BILATERAL INTERNAL MAMMARY ARTERIES AND LEFT RADIAL ARTERY;  Surgeon: [name], MD;  Location: [place];  Service: Open Heart Surgery;  Laterality: N/A;   IR IMAGING GUIDED PORT INSERTION  07/06/2020   LOW ANTERIOR RESECTION RECTUM W/ COLOPROCTOSTOMY  05/2003   MOHS SURGERY  01/2020   nose w/ graft   ORCHIECTOMY Left 03/11/2020   Procedure: [place];  Surgeon: [name], MD;  Location: [facility];  Service: Urology;  Laterality: Left;  ONLY NEEDS 60 MIN   RADIAL ARTERY HARVEST Left 03/25/2020   Procedure: LEFT RADIAL ARTERY HARVEST;  Surgeon: [name], MD;  Location: [place];  Service: Open Heart Surgery;  Laterality: Left;   RIGHT/LEFT HEART CATH AND CORONARY ANGIOGRAPHY N/A 03/23/2020   Procedure: RIGHT/LEFT HEART CATH AND CORONARY ANGIOGRAPHY;  Surgeon: [name], MD;  Location: [place] [facility];  Service: Cardiovascular;  Laterality: N/A;   SHOULDER SURGERY Right 1992; 07/ 2021   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 Left 12/24/2008   left ear and nose    TEE WITHOUT CARDIOVERSION N/A  03/25/2020   Procedure: TRANSESOPHAGEAL ECHOCARDIOGRAM (TEE);  Surgeon: [name], MD;  Location: [place];  Service: Open Heart Surgery;  Laterality: N/A;   TONSILLECTOMY AND ADENOIDECTOMY  child   UPPER GASTROINTESTINAL ENDOSCOPY  last one 06-06-2017    SOCIAL HISTORY: Social History   Socioeconomic History   Marital status: Married    Spouse name: Not on file   Number of children: Not on file   Years of education: Not on file   Highest education level: Not on file  Occupational History   Not on file  Tobacco Use   Smoking status: Former    Types: Pipe    Quit date: 11/29/1976    Years since quitting: 44.6   Smokeless tobacco: Never  Vaping Use   Vaping Use: Never used  Substance and Sexual Activity   Alcohol use: Yes    Alcohol/week: 0.0 standard drinks of alcohol    Comment: Occasional drink    Drug use: Never   Sexual activity: Not on file  Other Topics Concern   Not on file  Social History Narrative   Not on file   Social Determinants of Health   Financial Resource Strain: Not on file  Food Insecurity: Not on file  Transportation Needs: Not on file  Physical Activity: Not on file  Stress: Not on file  Social Connections: Not on file  Intimate Partner Violence: Not on file    FAMILY HISTORY: Family History  Problem Relation Age of Onset   Heart attack Mother    Stroke Father    Colon cancer Neg Hx    Esophageal cancer Neg Hx    Pancreatic cancer Neg Hx    Rectal cancer Neg Hx    Stomach cancer Neg Hx    Colon polyps Neg Hx     ALLERGIES:  has No Known Allergies.  MEDICATIONS:  Current Outpatient Medications  Medication Sig Dispense Refill   ACCU-CHEK GUIDE test strip      Apoaequorin (PREVAGEN PO) Take 1 tablet by mouth at bedtime.     aspirin EC 81 MG tablet Take 81 mg by mouth daily. Swallow whole.     atorvastatin (LIPITOR) 40 MG tablet TAKE 1 TABLET BY MOUTH DAILY. 90 tablet 3   B-Complex TABS See admin instructions.     Blood Glucose  Monitoring Suppl (ACCU-CHEK GUIDE ME) w/Device KIT      cholecalciferol (VITAMIN D) 1000 UNITS tablet Take 2,000 Units by mouth See admin instructions. Mon-friday     empagliflozin (JARDIANCE) 10 MG TABS tablet Take 1 tablet (10 mg total) by mouth daily before breakfast. 90 tablet 3   glucosamine-chondroitin 500-400 MG tablet Take 1 tablet by mouth. 5 times weekly     Lancets 33G MISC See admin instructions.     LORazepam (ATIVAN) 0.5 MG tablet Take 1 tablet (0.5 mg total) by mouth every 6 (  six) hours as needed for anxiety. Put 1 pill under your tongue every 6 hrs, if needed, for nausea 30 tablet 0   LORazepam (ATIVAN) 0.5 MG tablet 1 tablet at bedtime as needed     metFORMIN (GLUCOPHAGE) 1000 MG tablet Take 1,000 mg by mouth 2 (two) times daily with a meal.      metoprolol (TOPROL-XL) 200 MG 24 hr tablet TAKE 1 TABLET (200 MG TOTAL) BY MOUTH DAILY. 90 tablet 3   Misc Natural Products (GLUCOSAMINE CHOND COMPLEX/MSM) TABS See admin instructions.     Multiple Vitamin (MULTIVITAMIN WITH MINERALS) TABS tablet Take 1 tablet by mouth. 5 times weekly     omeprazole (PRILOSEC) 20 MG capsule Take 20 mg by mouth daily.     ondansetron (ZOFRAN) 8 MG tablet Take 1 tablet (8 mg total) by mouth every 8 (eight) hours as needed for nausea or vomiting. Take 1 pill every 8 hrs for 3 days. 30 tablet 3   ondansetron (ZOFRAN-ODT) 8 MG disintegrating tablet 1 tablet on the tongue and allow to dissolve  as needed     potassium chloride SA (KLOR-CON M20) 20 MEQ tablet Take 1 tablet (20 mEq total) by mouth daily. 90 tablet 3   prochlorperazine (COMPAZINE) 10 MG tablet Take 1 tablet (10 mg total) by mouth every 8 (eight) hours as needed for nausea or vomiting. 40 tablet 2   prochlorperazine (COMPAZINE) 10 MG tablet See admin instructions.     sacubitril-valsartan (ENTRESTO) 24-26 MG TAKE 1 TABLET BY MOUTH 2 TIMES DAILY. 180 tablet 3   spironolactone (ALDACTONE) 25 MG tablet TAKE 1/2 TABLET BY MOUTH DAILY. 135 tablet 1    tamsulosin (FLOMAX) 0.4 MG CAPS capsule Take 0.4 mg by mouth at bedtime.     torsemide (DEMADEX) 20 MG tablet Take 20 mg by mouth daily.     vitamin B-12 (CYANOCOBALAMIN) 1000 MCG tablet Take 1,000 mcg by mouth. 5 times a week     Current Facility-Administered Medications  Medication Dose Route Frequency Provider Last Rate Last Admin   potassium chloride (KLOR-CON) CR tablet 20 mEq  20 mEq Oral BID [name], MD       Facility-Administered Medications Ordered in Other Visits  Medication Dose Route Frequency Provider Last Rate Last Admin   heparin lock flush 100 unit/mL  500 Units Intracatheter Once Brunetta Genera, MD       sodium chloride flush (NS) 0.9 % injection 10 mL  10 mL Intracatheter Once Brunetta Genera, MD        REVIEW OF SYSTEMS:   .10 Point review of Systems was done is negative except as noted above.  PHYSICAL EXAMINATION: ECOG PERFORMANCE STATUS: 2 - Symptomatic, <50% confined to bed  .BP 108/65 (BP Location: Left Arm, Patient Position: Sitting)   Pulse 74   Temp (!) 97.5 F (36.4 C) (Temporal)   Resp 17   Wt 196 lb 9.6 oz (89.2 kg)   SpO2 97%   BMI 25.94 kg/m  NAD GENERAL:alert, in no acute distress and comfortable SKIN: no acute rashes, no significant lesions EYES: conjunctiva are pink and non-injected, sclera anicteric NECK: supple, no JVD LYMPH:  no palpable lymphadenopathy in the cervical, axillary or inguinal regions LUNGS: clear to auscultation b/l with normal respiratory effort HEART: regular rate & rhythm ABDOMEN:  normoactive bowel sounds , non tender, not distended. Extremity: no pedal edema PSYCH: alert & oriented x 3 with fluent speech NEURO: no focal motor/sensory deficits  LABORATORY DATA:  I have  reviewed the data as listed      Latest Ref Rng & Units 04/27/2021    2:49 PM 02/09/2021    2:37 PM 12/07/2020    1:51 PM  CBC  WBC 4.0 - 10.5 K/uL 7.6  9.2  7.6   Hemoglobin 13.0 - 17.0 g/dL 11.5  11.0  9.8   Hematocrit  39.0 - 52.0 % 34.9  31.9  30.9   Platelets 150 - 400 K/uL 214  222  260     .    Latest Ref Rng & Units 04/27/2021    2:49 PM 02/09/2021    2:37 PM 02/04/2021    9:52 AM  CMP  Glucose 70 - 99 mg/dL 103  166  215   BUN 8 - 23 mg/dL 47  28  24   Creatinine 0.61 - 1.24 mg/dL 2.02  1.86  1.87   Sodium 135 - 145 mmol/L 140  141  142   Potassium 3.5 - 5.1 mmol/L 4.4  4.2  4.1   Chloride 98 - 111 mmol/L 108  105  103   CO2 22 - 32 mmol/L $RemoveB'23  25  21   'XVUowLaI$ Calcium 8.9 - 10.3 mg/dL 9.7  9.3  8.9   Total Protein 6.5 - 8.1 g/dL 7.4  6.8    Total Bilirubin 0.3 - 1.2 mg/dL 0.7  0.7    Alkaline Phos 38 - 126 U/L 63  57    AST 15 - 41 U/L 18  16    ALT 0 - 44 U/L 17  12     . Lab Results  Component Value Date   LDH 163 04/27/2021   SURGICAL PATHOLOGY   THIS IS AN ADDENDUM REPORT   CASE: [id number]  PATIENT: Roy [name]  Surgical Pathology Report  Addendum    Reason for Addendum #1:  Molecular Genetic Test Results, FISH   Clinical History: Left testicular mass (crm)      FINAL MICROSCOPIC DIAGNOSIS:   A. TESTICLE, LEFT, ORCHIECTOMY:  -  Diffuse aggressive large B-cell lymphoma  -  See comment    COMMENT:   Sections of testicle show architectural effacement by sheets of large  lymphoid cells with vesicular nuclei and pale cytoplasm.  There is  admixed apoptotic debris and increased mitotic activity.  By  immunohistochemistry, the large lymphoid cells are positive for CD20,  CD5 (dim), BCL6 (dim), MUM1, and BCL2.  They are negative for CD10, CD30  (<1%), cyclin D1, and TdT.  The Ki67 proliferation index is up to  approximately 70%.  CD3 highlights small T-cells in the background. EBV  is negative by in situ hybridization.   Together, the findings support the diagnosis of a diffuse aggressive  large B-cell lymphoma. The differential diagnosis includes a diffuse  large B-cell lymphoma, NOS, with activated B-cell subtype by the [facility]  algorithm and high-grade B-cell lymphoma with  MYC and BCL2 or BCL6  rearrangements.  FISH for BCL2, BCL6, and MYC rearrangements will be  performed. Correlation with clinical and radiographic findings is  recommended for consideration of a primary testicular lymphoma.   Result reported to L. Gibson on 03/16/20 at 1720 by S. O'Neill.    ADDENDUM:   FISH RESULTS:   Results: NORMAL   Interpretation:   BCL6 rearrangement:      Not Detected  MYC rearrangement:  Not Detected  MYC amplification:  Not Detected  BCL6 rearrangement:      Not Detected   Please note this testing was performed  and interpreted by an outside  facility ([facility]).  This addendum is only being added to provide a  summary of the results for report completeness.  Please see electronic  medical record for a copy of the full report.   RADIOGRAPHIC STUDIES: I have personally reviewed the radiological images as listed and agreed with the findings in the report. No results found.  ASSESSMENT & PLAN:   76 year old wonderful gentleman with history of hypertension, borderline diabetes, dyslipidemia, GERD with recent STEMI on 03/21/2020 and status post four-vessel CABG on 03/25/2020.  1) Left-sided primary testicular large B-cell lymphoma.- currently in remission S/p ipsilateral orchiectomy S/p 6 cycles of R-CEOP and 3 dose of IT MTX (for CNS prophylaxis) and R Tto contralateral testis. BCL-2, BCL 6, c-Myc negative.  2) history of acute myocardial infarction status post CABG x4 (06/26/2020) 3) nonischemic and ischemic cardiomyopathy ejection fraction 25 to 30% on last echo. 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 -No  other significant residual chemotherapy toxicities at this time. -No indication for recurrence/progression of his testicular large B-cell lymphoma at this time. -Patient has recovered from most of the fatigue and other side effects of his chemotherapy and feels much better with improved appetite. -Continue follow-up with cardiology to continue to optimize his management of systolic CHF. -Continue follow-up with primary care physician to continue to optimize treatment of his hypertension diabetes dyslipidemia and other medical issues. 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 -Continue vitamin D 2000 units daily   FOLLOW UP: RTC with Dr Irene Limbo with labs in 4 months  The total time spent in the appointment was 23 minutes*.  All of the patient's questions were answered with apparent satisfaction. The patient knows to call the clinic with any problems, questions or concerns.   Sullivan Lone MD MS AAHIVMS [facility] Hematology/Oncology Physician [place]  .*Total Encounter Time as defined by the Centers for Medicare and Medicaid Services includes, in addition to the face-to-face time of a patient visit (documented in the note above) non-face-to-face time: obtaining and reviewing outside history, ordering and reviewing medications, tests or procedures, care coordination (communications with other health care professionals or caregivers) and documentation in the medical record.  I, Melene Muller, am acting as scribe for Dr. Sullivan Lone, MD.  .I have reviewed the above documentation for accuracy and completeness, and I agree with the above. Brunetta Genera MD

## 2021-07-29 ENCOUNTER — Telehealth: Payer: Self-pay | Admitting: Hematology

## 2021-07-29 NOTE — Telephone Encounter (Signed)
Left message with follow-up appointment per 7/3 los.

## 2021-08-01 ENCOUNTER — Encounter: Payer: Self-pay | Admitting: Hematology

## 2021-08-02 ENCOUNTER — Other Ambulatory Visit: Payer: Medicare HMO

## 2021-08-02 DIAGNOSIS — I428 Other cardiomyopathies: Secondary | ICD-10-CM | POA: Diagnosis not present

## 2021-08-03 ENCOUNTER — Other Ambulatory Visit: Payer: Medicare HMO

## 2021-08-03 LAB — CBC WITH DIFFERENTIAL/PLATELET
Basophils Absolute: 0 10*3/uL (ref 0.0–0.2)
Basos: 1 %
EOS (ABSOLUTE): 0.2 10*3/uL (ref 0.0–0.4)
Eos: 2 %
Hematocrit: 34.8 % — ABNORMAL LOW (ref 37.5–51.0)
Hemoglobin: 11.6 g/dL — ABNORMAL LOW (ref 13.0–17.7)
Immature Grans (Abs): 0 10*3/uL (ref 0.0–0.1)
Immature Granulocytes: 0 %
Lymphocytes Absolute: 0.9 10*3/uL (ref 0.7–3.1)
Lymphs: 11 %
MCH: 31.6 pg (ref 26.6–33.0)
MCHC: 33.3 g/dL (ref 31.5–35.7)
MCV: 95 fL (ref 79–97)
Monocytes Absolute: 0.5 10*3/uL (ref 0.1–0.9)
Monocytes: 6 %
Neutrophils Absolute: 6.9 10*3/uL (ref 1.4–7.0)
Neutrophils: 80 %
Platelets: 234 10*3/uL (ref 150–450)
RBC: 3.67 x10E6/uL — ABNORMAL LOW (ref 4.14–5.80)
RDW: 11.6 % (ref 11.6–15.4)
WBC: 8.6 10*3/uL (ref 3.4–10.8)

## 2021-08-03 LAB — BASIC METABOLIC PANEL
BUN/Creatinine Ratio: 18 (ref 10–24)
BUN: 41 mg/dL — ABNORMAL HIGH (ref 8–27)
CO2: 19 mmol/L — ABNORMAL LOW (ref 20–29)
Calcium: 9.6 mg/dL (ref 8.6–10.2)
Chloride: 102 mmol/L (ref 96–106)
Creatinine, Ser: 2.26 mg/dL — ABNORMAL HIGH (ref 0.76–1.27)
Glucose: 251 mg/dL — ABNORMAL HIGH (ref 70–99)
Potassium: 4.2 mmol/L (ref 3.5–5.2)
Sodium: 138 mmol/L (ref 134–144)
eGFR: 29 mL/min/{1.73_m2} — ABNORMAL LOW (ref >59)

## 2021-08-16 NOTE — Telephone Encounter (Signed)
Patient called to follow-up whether approval for surgery from insurance has been received.  Patient stated he has appts at 10:30am and 1:30pm so message can be left in his voicemail.

## 2021-08-18 NOTE — Telephone Encounter (Signed)
Spoke with the patient's wife and advised that procedure has been approved.

## 2021-08-20 NOTE — Pre-Procedure Instructions (Signed)
Instructed patient on the following items: Arrival time 0730 Nothing to eat or drink after midnight No meds AM of procedure Responsible person to drive you home and stay with you for 24 hrs Wash with special soap night before and morning of procedure  

## 2021-08-23 ENCOUNTER — Ambulatory Visit (HOSPITAL_COMMUNITY): Payer: Medicare HMO

## 2021-08-23 ENCOUNTER — Ambulatory Visit (HOSPITAL_COMMUNITY)
Admission: RE | Admit: 2021-08-23 | Discharge: 2021-08-23 | Disposition: A | Discharge status: Home / Self Care | Payer: Medicare HMO | Attending: Internal Medicine | Admitting: Internal Medicine

## 2021-08-23 ENCOUNTER — Ambulatory Visit (HOSPITAL_COMMUNITY)
Admission: RE | Disposition: A | Discharge status: Home / Self Care | Payer: Medicare HMO | Source: Home / Self Care | Attending: Internal Medicine

## 2021-08-23 ENCOUNTER — Other Ambulatory Visit: Payer: Self-pay

## 2021-08-23 DIAGNOSIS — I10 Essential (primary) hypertension: Secondary | ICD-10-CM | POA: Diagnosis not present

## 2021-08-23 DIAGNOSIS — I255 Ischemic cardiomyopathy: Secondary | ICD-10-CM

## 2021-08-23 DIAGNOSIS — Z452 Encounter for adjustment and management of vascular access device: Secondary | ICD-10-CM | POA: Insufficient documentation

## 2021-08-23 DIAGNOSIS — I110 Hypertensive heart disease with heart failure: Secondary | ICD-10-CM | POA: Diagnosis not present

## 2021-08-23 DIAGNOSIS — I2510 Atherosclerotic heart disease of native coronary artery without angina pectoris: Secondary | ICD-10-CM | POA: Insufficient documentation

## 2021-08-23 DIAGNOSIS — E7800 Pure hypercholesterolemia, unspecified: Secondary | ICD-10-CM | POA: Diagnosis not present

## 2021-08-23 DIAGNOSIS — Z87891 Personal history of nicotine dependence: Secondary | ICD-10-CM | POA: Diagnosis not present

## 2021-08-23 DIAGNOSIS — Z951 Presence of aortocoronary bypass graft: Secondary | ICD-10-CM | POA: Insufficient documentation

## 2021-08-23 DIAGNOSIS — E1159 Type 2 diabetes mellitus with other circulatory complications: Secondary | ICD-10-CM | POA: Diagnosis not present

## 2021-08-23 DIAGNOSIS — I5022 Chronic systolic (congestive) heart failure: Secondary | ICD-10-CM | POA: Diagnosis not present

## 2021-08-23 DIAGNOSIS — I214 Non-ST elevation (NSTEMI) myocardial infarction: Secondary | ICD-10-CM

## 2021-08-23 DIAGNOSIS — Z95810 Presence of automatic (implantable) cardiac defibrillator: Secondary | ICD-10-CM | POA: Diagnosis not present

## 2021-08-23 DIAGNOSIS — I5189 Other ill-defined heart diseases: Secondary | ICD-10-CM | POA: Diagnosis not present

## 2021-08-23 HISTORY — PX: PORTA CATH REMOVAL: CATH118286

## 2021-08-23 HISTORY — PX: ICD IMPLANT: EP1208

## 2021-08-23 LAB — GLUCOSE, CAPILLARY: Glucose-Capillary: 125 mg/dL — ABNORMAL HIGH (ref 70–99)

## 2021-08-23 SURGERY — ICD IMPLANT

## 2021-08-23 MED ORDER — LIDOCAINE HCL 1 % IJ SOLN
INTRAMUSCULAR | Status: AC
  Filled 2021-08-23: qty 20

## 2021-08-23 MED ORDER — SODIUM CHLORIDE 0.9 % IV SOLN
80.0000 mg | INTRAVENOUS | Status: AC
  Administered 2021-08-23: 80 mg

## 2021-08-23 MED ORDER — CHLORHEXIDINE GLUCONATE 4 % EX LIQD
4.0000 | Freq: Once | CUTANEOUS | Status: AC
  Administered 2021-08-23: 4 via TOPICAL
  Filled 2021-08-23: qty 60

## 2021-08-23 MED ORDER — ACETAMINOPHEN 325 MG PO TABS: 325.0000 mg | ORAL_TABLET | ORAL | Status: DC | PRN

## 2021-08-23 MED ORDER — HEPARIN (PORCINE) IN NACL 1000-0.9 UT/500ML-% IV SOLN
INTRAVENOUS | Status: AC
  Filled 2021-08-23: qty 500

## 2021-08-23 MED ORDER — FENTANYL CITRATE (PF) 100 MCG/2ML IJ SOLN
INTRAMUSCULAR | Status: AC
  Filled 2021-08-23: qty 2

## 2021-08-23 MED ORDER — LIDOCAINE HCL (PF) 1 % IJ SOLN
INTRAMUSCULAR | Status: DC | PRN
  Administered 2021-08-23: 20 mL
  Administered 2021-08-23: 40 mL

## 2021-08-23 MED ORDER — CEFAZOLIN SODIUM-DEXTROSE 2-4 GM/100ML-% IV SOLN
2.0000 g | INTRAVENOUS | Status: AC
  Administered 2021-08-23: 2 g via INTRAVENOUS

## 2021-08-23 MED ORDER — ONDANSETRON HCL 4 MG/2ML IJ SOLN
4.0000 mg | Freq: Four times a day (QID) | INTRAMUSCULAR | Status: DC | PRN
Start: 2021-08-23 — End: 2021-08-23

## 2021-08-23 MED ORDER — MIDAZOLAM HCL 5 MG/5ML IJ SOLN
INTRAMUSCULAR | Status: DC | PRN
  Administered 2021-08-23: 2 mg via INTRAVENOUS
  Administered 2021-08-23: 1 mg via INTRAVENOUS

## 2021-08-23 MED ORDER — MIDAZOLAM HCL 5 MG/5ML IJ SOLN
INTRAMUSCULAR | Status: AC
  Filled 2021-08-23: qty 5

## 2021-08-23 MED ORDER — SODIUM CHLORIDE 0.9 % IV SOLN: INTRAVENOUS | Status: DC

## 2021-08-23 MED ORDER — FENTANYL CITRATE (PF) 100 MCG/2ML IJ SOLN
INTRAMUSCULAR | Status: DC | PRN
  Administered 2021-08-23: 12.5 ug via INTRAVENOUS
  Administered 2021-08-23: 25 ug via INTRAVENOUS

## 2021-08-23 MED ORDER — POVIDONE-IODINE 10 % EX SWAB: 2.0000 | Freq: Once | CUTANEOUS | Status: DC

## 2021-08-23 MED ORDER — CEFAZOLIN SODIUM-DEXTROSE 2-4 GM/100ML-% IV SOLN
INTRAVENOUS | Status: AC
  Filled 2021-08-23: qty 100

## 2021-08-23 MED ORDER — HEPARIN (PORCINE) IN NACL 1000-0.9 UT/500ML-% IV SOLN
INTRAVENOUS | Status: DC | PRN
  Administered 2021-08-23: 500 mL

## 2021-08-23 MED ORDER — SODIUM CHLORIDE 0.9 % IV SOLN
INTRAVENOUS | Status: AC
  Filled 2021-08-23: qty 2

## 2021-08-23 MED ORDER — CEFAZOLIN SODIUM-DEXTROSE 1-4 GM/50ML-% IV SOLN
1.0000 g | Freq: Once | INTRAVENOUS | Status: AC
  Administered 2021-08-23: 1 g via INTRAVENOUS
  Filled 2021-08-23 (×2): qty 50

## 2021-08-23 SURGICAL SUPPLY — 6 items
CABLE SURGICAL S-101-97-12 (CABLE) ×2 IMPLANT
ICD ACTICOR DX (ICD Generator) ×1 IMPLANT
LEAD PLEXA 65/15 (Lead) ×1 IMPLANT
PAD DEFIB RADIO PHYSIO CONN (PAD) ×2 IMPLANT
SHEATH 8FR PRELUDE SNAP 13 (SHEATH) ×1 IMPLANT
TRAY PACEMAKER INSERTION (PACKS) ×2 IMPLANT

## 2021-08-23 NOTE — H&P (Signed)
HPI [name] is referred today by Dr. Farris Has for consideration for ICD insertion. 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 Lottie Dawson and now Dr. Reine Just.  No Known Allergies           Current Outpatient Medications  Medication Sig Dispense Refill   ACCU-CHEK GUIDE test strip         Apoaequorin (PREVAGEN PO) Take 1 tablet by mouth at bedtime.       aspirin EC 81 MG tablet Take 81 mg by mouth daily. Swallow whole.       atorvastatin (LIPITOR) 40 MG tablet Take 1 tablet (40 mg total) by mouth daily. 90 tablet 3   B-Complex TABS See admin instructions.       Blood Glucose Monitoring Suppl (ACCU-CHEK GUIDE ME) w/Device KIT         cholecalciferol (VITAMIN D) 1000 UNITS tablet Take 2,000 Units by mouth See admin instructions. Mon-friday       empagliflozin (JARDIANCE) 10 MG TABS tablet Take 1 tablet (10 mg total) by mouth daily before breakfast. 90 tablet 3   glucosamine-chondroitin 500-400 MG tablet Take 1 tablet by mouth. 5 times weekly       Lancets 33G MISC See admin instructions.       LORazepam (ATIVAN) 0.5 MG tablet Take 1 tablet (0.5 mg total) by mouth every 6 (six) hours as needed for anxiety. Put 1 pill under your tongue every 6 hrs, if needed, for nausea 30 tablet 0   LORazepam (ATIVAN) 0.5 MG tablet 1 tablet at bedtime as needed       metFORMIN (GLUCOPHAGE) 1000 MG tablet Take 1,000 mg by mouth 2 (two) times daily with a meal.        metoprolol (TOPROL XL) 200 MG 24 hr tablet Take 1 tablet (200 mg total) by mouth daily. 90 tablet 3   Misc Natural Products (GLUCOSAMINE CHOND COMPLEX/MSM) TABS See admin instructions.       Multiple Vitamin (MULTIVITAMIN WITH MINERALS) TABS tablet Take 1 tablet by  mouth. 5 times weekly       omeprazole (PRILOSEC) 20 MG capsule Take 20 mg by mouth daily.       ondansetron (ZOFRAN) 8 MG tablet Take 1 tablet (8 mg total) by mouth every 8 (eight) hours as needed for nausea or vomiting. Take 1 pill every 8 hrs for 3 days. 30 tablet 3   ondansetron (ZOFRAN-ODT) 8 MG disintegrating tablet 1 tablet on the tongue and allow to dissolve  as needed       potassium chloride SA (KLOR-CON M20) 20 MEQ tablet Take 1 tablet (20 mEq total) by mouth daily. 90 tablet 3   prochlorperazine (COMPAZINE) 10 MG tablet Take 1 tablet (10 mg total) by mouth every 8 (eight) hours as needed for nausea or vomiting. 40 tablet 2   prochlorperazine (COMPAZINE) 10 MG tablet See admin instructions.       sacubitril-valsartan (ENTRESTO) 24-26 MG TAKE 1 TABLET BY MOUTH 2 TIMES DAILY. 180 tablet 3   spironolactone (ALDACTONE) 25 MG tablet TAKE 1/2 TABLET BY MOUTH DAILY. 135 tablet 1   tamsulosin (FLOMAX) 0.4 MG  CAPS capsule Take 0.4 mg by mouth at bedtime.       torsemide (DEMADEX) 20 MG tablet Take 20 mg by mouth daily.       vitamin B-12 (CYANOCOBALAMIN) 1000 MCG tablet Take 1,000 mcg by mouth. 5 times a week                 Current Facility-Administered Medications  Medication Dose Route Frequency Provider Last Rate Last Admin   potassium chloride (KLOR-CON) CR tablet 20 mEq  20 mEq Oral BID [name], MD                Past Medical History:  Diagnosis Date   Allergic rhinitis     Arthritis      wrists   Cardiomyopathy, nonischemic [facility])      followed by cardiology--- dr [name]---  2016 ef 45-50% ,  2016 nuclear ef 37%,  2017 per echo ef 50-55%   CHF (congestive heart failure), NYHA class III ([place]) 03/21/2020   Coronary artery disease     GERD (gastroesophageal reflux disease)     Hiatal hernia     History of kidney stones     History of squamous cell carcinoma excision      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;   01/ 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   History of syncope  (03-06-2020 pt stated has not had sycopal episode in few years, stated it seems to happen in extreme hot conditions)    cardiologist--- dr [name]--- dx recurrent syncope;  nuclear study 11-03-2014 intermediate risk w/ no ischemia, apical hypokinesis, nuclear ef 37%;  event monitor-- 12-21-2015 SB/ ST  no pauses/ arrythmia's;  echo 08-03-2015 ef 50-55%   Hyperlipidemia     Hypertension      followed by pcp   Hypovitaminosis D     Mass of left testicle     Nocturia     Plantar fasciitis     Presence of surgical incision      01/ 2022  moh's w/ skin graft of nose, per pt still healing and wear bandage daily   Type 2 diabetes mellitus ([place])      pt is adament that he is not and have been told he is a diabetic but a borderline;  followed by pcp, in pcp note states DM2 and takes 2 meds daily   Wears glasses     Wears hearing aid in both ears     Weight loss 11/16/2020      ROS:    All systems reviewed and negative except as noted in the HPI.          Past Surgical History:  Procedure Laterality Date   COLONOSCOPY   last one 01-30-2017   CORONARY ARTERY BYPASS GRAFT N/A 03/25/2020    Procedure: CORONARY ARTERY BYPASS GRAFTING (CABG), ON PUMP, TIMES FOUR, USING BILATERAL INTERNAL MAMMARY ARTERIES AND LEFT RADIAL ARTERY;  Surgeon: [name], MD;  Location: [place];  Service: Open Heart Surgery;  Laterality: N/A;   IR IMAGING GUIDED PORT INSERTION   07/06/2020   LOW ANTERIOR RESECTION RECTUM W/ COLOPROCTOSTOMY   05/2003   MOHS SURGERY   01/2020    nose w/ graft   ORCHIECTOMY Left 03/11/2020    Procedure: [place];  Surgeon: [name], MD;  Location: [facility];  Service: Urology;  Laterality: Left;  ONLY NEEDS 60 MIN   RADIAL ARTERY HARVEST Left 03/25/2020    Procedure:  LEFT RADIAL ARTERY HARVEST;  Surgeon: Atkins, Broadus Z, MD;  Location: [facility] OR;  Service: Open Heart Surgery;  Laterality: Left;   RIGHT/LEFT HEART CATH AND CORONARY  ANGIOGRAPHY N/A 03/23/2020    Procedure: RIGHT/LEFT HEART CATH AND CORONARY ANGIOGRAPHY;  Surgeon: Varanasi, Jayadeep S, MD;  Location: [facility] INVASIVE [facility];  Service: Cardiovascular;  Laterality: N/A;   SHOULDER SURGERY Right 1992; 07/ 2021   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 Left 12/24/2008    left ear and nose    TEE WITHOUT CARDIOVERSION N/A 03/25/2020    Procedure: TRANSESOPHAGEAL ECHOCARDIOGRAM (TEE);  Surgeon: Atkins, Broadus Z, MD;  Location: [facility] OR;  Service: Open Heart Surgery;  Laterality: N/A;   TONSILLECTOMY AND ADENOIDECTOMY   child   UPPER GASTROINTESTINAL ENDOSCOPY   last one 06-06-2017             Family History  Problem Relation Age of Onset   Heart attack Mother     Stroke Father     Colon cancer Neg Hx     Esophageal cancer Neg Hx     Pancreatic cancer Neg Hx     Rectal cancer Neg Hx     Stomach cancer Neg Hx     Colon polyps Neg Hx          Social History         Socioeconomic History   Marital status: Married      Spouse name: Not on file   Number of children: Not on file   Years of education: Not on file   Highest education level: Not on file  Occupational History   Not on file  Tobacco Use   Smoking status: Former      Types: Pipe      Quit date: 11/29/1976      Years since quitting: 44.3   Smokeless tobacco: Never  Vaping Use   Vaping Use: Never used  Substance and Sexual Activity   Alcohol use: Yes      Alcohol/week: 0.0 standard drinks      Comment: Occasional drink    Drug use: Never   Sexual activity: Not on file  Other Topics Concern   Not on file  Social History Narrative   Not on file    Social Determinants of Health    Financial Resource Strain: Not on file  Food Insecurity: Not on file  Transportation Needs: Not on file  Physical Activity: Not on file  Stress: Not on file  Social Connections: Not on file  Intimate Partner Violence: Not on file        BP (!) 98/50   Pulse 96   Ht 6' 1" (1.854 m)   Wt 184  lb (83.5 kg)   SpO2 98%   BMI 24.28 kg/m    Physical Exam:   Well appearing NAD HEENT: Unremarkable Neck:  No JVD, no thyromegally Lymphatics:  No adenopathy Back:  No CVA tenderness Lungs:  Clear with no wheezes HEART:  Regular rate rhythm, no murmurs, no rubs, no clicks Abd:  soft, positive bowel sounds, no organomegally, no rebound, no guarding Ext:  2 plus pulses, no edema, no cyanosis, no clubbing Skin:  No rashes no nodules Neuro:  CN II through XII intact, motor grossly intact   Assess/Plan:  HFREF with an EF of 20% despite GDMT - I have discussed the treatment options with the patient. ICD insertion for primary prevention of sudden death is indicated. He will call us if   he wishes to proceed.  CAD - he is s/p CABG and denies anginal symptoms. No change in meds. Syncope - In the office today he passed out despite my laying him horizontally. He had loss of continence. This was a classic vagal spell brought on by anxiety over the discussion of ICD insertion. He was asked not to drive. EP Attending  Patient seen and examined. Agree with above. He presents today to have his ICD inserted and to have his port O - cath removed. I have reveiwed the procedure with the patient and he wishes to proceed.   ,MD 

## 2021-08-23 NOTE — Progress Notes (Addendum)
Dr [name] notified of post CXR-advised pt may be discharged at [place]

## 2021-08-23 NOTE — Discharge Instructions (Addendum)
You can remove the dressing from the Porta-cath (right chest) Wed 8/2.       Supplemental Discharge Instructions for  Pacemaker/Defibrillator Patients  Tomorrow, 08/24/21, send in a device transmission  Activity No heavy lifting or vigorous activity with your left/right arm for 6 to 8 weeks.  Do not raise your left/right arm above your head for one week.  Gradually raise your affected arm as drawn below.              08/28/21                      08/29/21                    08/30/21                   08/31/21 __  NO DRIVING for  1 week   ; you may begin driving on  07/02/60   .  WOUND CARE Keep the wound area clean and dry.  Do not get this area wet , no showers for one week; you may shower on  08/31/21   . Tomorrow, 08/24/21, remove the arm sling Tomorrow, 08/24/21 remove the LARGE outer plastic bandage.  Underneath the plastic bandage there are steri strips (paper tapes), DO NOT remove these. The tape/steri-strips on your wound will fall off; do not pull them off.  No bandage is needed on the site.  DO  NOT apply any creams, oils, or ointments to the wound area. If you notice any drainage or discharge from the wound, any swelling or bruising at the site, or you develop a fever > 101? F after you are discharged home, call the office at once.  Special Instructions You are still able to use cellular telephones; use the ear opposite the side where you have your pacemaker/defibrillator.  Avoid carrying your cellular phone near your device. When traveling through airports, show security personnel your identification card to avoid being screened in the metal detectors.  Ask the security personnel to use the hand wand. Avoid arc welding equipment, MRI testing (magnetic resonance imaging), TENS units (transcutaneous nerve stimulators).  Call the office for questions about other devices. Avoid electrical appliances that are in poor condition or are not properly grounded. Microwave ovens are safe to be near or  to operate.  Additional information for defibrillator patients should your device go off: If your device goes off ONCE and you feel fine afterward, notify the device clinic nurses. If your device goes off ONCE and you do not feel well afterward, call 911. If your device goes off TWICE, call 911. If your device goes off THREE times in one day, call 911.  DO NOT DRIVE YOURSELF OR A FAMILY MEMBER WITH A DEFIBRILLATOR TO THE HOSPITAL--CALL 911. Cardiac Ablation, Care After  This sheet gives you information about how to care for yourself after your procedure. Your health care provider may also give you more specific instructions. If you have problems or questions, contact your health care provider. What can I expect after the procedure? After the procedure, it is common to have: Bruising around your puncture site. Tenderness around your puncture site. Skipped heartbeats. Tiredness (fatigue).  Follow these instructions at home: Puncture site care  Follow instructions from your health care provider about how to take care of your puncture site. Make sure you: If present, leave stitches (sutures), skin glue, or adhesive strips in place. These skin closures may need to  stay in place for up to 2 weeks. If adhesive strip edges start to loosen and curl up, you may trim the loose edges. Do not remove adhesive strips completely unless your health care provider tells you to do that. If a large square bandage is present, this may be removed 24 hours after surgery.  Check your puncture site every day for signs of infection. Check for: Redness, swelling, or pain. Fluid or blood. If your puncture site starts to bleed, lie down on your back, apply firm pressure to the area, and contact your health care provider. Warmth. Pus or a bad smell. A pea or small marble sized lump at the site is normal and can take up to three months to resolve.  Driving Do not drive for at least 4 days after your procedure or  however long your health care provider recommends. (Do not resume driving if you have previously been instructed not to drive for other health reasons.) Do not drive or use heavy machinery while taking prescription pain medicine. Activity Avoid activities that take a lot of effort for at least 7 days after your procedure. Do not lift anything that is heavier than 5 lb (4.5 kg) for one week.  No sexual activity for 1 week.  Return to your normal activities as told by your health care provider. Ask your health care provider what activities are safe for you. General instructions Take over-the-counter and prescription medicines only as told by your health care provider. Do not use any products that contain nicotine or tobacco, such as cigarettes and e-cigarettes. If you need help quitting, ask your health care provider. You may shower after 24 hours, but Do not take baths, swim, or use a hot tub for 1 week.  Do not drink alcohol for 24 hours after your procedure. Keep all follow-up visits as told by your health care provider. This is important. Contact a health care provider if: You have redness, mild swelling, or pain around your puncture site. You have fluid or blood coming from your puncture site that stops after applying firm pressure to the area. Your puncture site feels warm to the touch. You have pus or a bad smell coming from your puncture site. You have a fever. You have chest pain or discomfort that spreads to your neck, jaw, or arm. You are sweating a lot. You feel nauseous. You have a fast or irregular heartbeat. You have shortness of breath. You are dizzy or light-headed and feel the need to lie down. You have pain or numbness in the arm or leg closest to your puncture site. Get help right away if: Your puncture site suddenly swells. Your puncture site is bleeding and the bleeding does not stop after applying firm pressure to the area. These symptoms may represent a serious  problem that is an emergency. Do not wait to see if the symptoms will go away. Get medical help right away. Call your local emergency services (911 in the [place] Do not drive yourself to the hospital. Summary After the procedure, it is normal to have bruising and tenderness at the puncture site in your groin, neck, or forearm. Check your puncture site every day for signs of infection. Get help right away if your puncture site is bleeding and the bleeding does not stop after applying firm pressure to the area. This is a medical emergency. This information is not intended to replace advice given to you by your health care provider. Make sure you discuss any questions  you have with your health care provider.  After Your ICD (Implantable Cardiac Defibrillator)   You have a Biotronik ICD  ACTIVITY Do not lift your arm above shoulder height for 1 week after your procedure. After 7 days, you may progress as below.  You should remove your sling 24 hours after your procedure, unless otherwise instructed by your provider.     Monday August 30, 2021  Tuesday August 31, 2021 Wednesday September 01, 2021 Thursday September 02, 2021   Do not lift, push, pull, or carry anything over 10 pounds with the affected arm until 6 weeks (Monday October 04, 2021 ) after your procedure.   You may drive AFTER your wound check, unless you have been told otherwise by your provider.   Ask your healthcare provider when you can go back to work   INCISION/Dressing If you are on a blood thinner such as Coumadin, Xarelto, Eliquis, Plavix, or Pradaxa please confirm with your provider when this should be resumed.   If large square, outer bandage is left in place, this can be removed after 24 hours from your procedure. Do not remove steri-strips or glue as below.   Monitor your defibrillator site for redness, swelling, and drainage. Call the device clinic at [phone] if you experience these symptoms or fever/chills.  If  your incision is sealed with Steri-strips or staples, you may shower 7 days after your procedure or when told by your provider. Do not remove the steri-strips or let the shower hit directly on your site. You may wash around your site with soap and water.    If you were discharged in a sling, please do not wear this during the day more than 48 hours after your surgery unless otherwise instructed. This may increase the risk of stiffness and soreness in your shoulder.   Avoid lotions, ointments, or perfumes over your incision until it is well-healed.  You may use a hot tub or a pool AFTER your wound check appointment if the incision is completely closed.  Your ICD is designed to protect you from life threatening heart rhythms. Because of this, you may receive a shock.   1 shock with no symptoms:  Call the office during business hours. 1 shock with symptoms (chest pain, chest pressure, dizziness, lightheadedness, shortness of breath, overall feeling unwell):  Call 911. If you experience 2 or more shocks in 24 hours:  Call 911. If you receive a shock, you should not drive for 6 months per the [place] DMV IF you receive appropriate therapy from your ICD.   ICD Alerts:  Some alerts are vibratory and others beep. These are NOT emergencies. Please call our office to let us know. If this occurs at night or on weekends, it can wait until the next business day. Send a remote transmission.  If your device is capable of reading fluid status (for heart failure), you will be offered monthly monitoring to review this with you.   DEVICE MANAGEMENT Remote monitoring is used to monitor your ICD from home. This monitoring is scheduled every 91 days by our office. It allows [place] to keep an eye on the functioning of your device to ensure it is working properly. You will routinely see your Electrophysiologist annually (more often if necessary).   You should receive your ID card for your new device in 4-8 weeks. Keep this  card with you at all times once received. Consider wearing a medical alert bracelet or necklace.  Your ICD  may be  MRI compatible. This will be discussed at your next office visit/wound check.  You should avoid contact with strong electric or magnetic fields.   Do not use amateur (ham) radio equipment or electric (arc) welding torches. MP3 player headphones with magnets should not be used. Some devices are safe to use if held at least 12 inches (30 cm) from your defibrillator. These include power tools, lawn mowers, and speakers. If you are unsure if something is safe to use, ask your health care provider.  When using your cell phone, hold it to the ear that is on the opposite side from the defibrillator. Do not leave your cell phone in a pocket over the defibrillator.  You may safely use electric blankets, heating pads, computers, and microwave ovens.  Call the office right away if: You have chest pain. You feel more than one shock. You feel more short of breath than you have felt before. You feel more light-headed than you have felt before. Your incision starts to open up.  This information is not intended to replace advice given to you by your health care provider. Make sure you discuss any questions you have with your health care provider.

## 2021-08-23 NOTE — Progress Notes (Signed)
Patient started to have a blank stare, sweating, and nausea. Page RN & Angie NT came to assist. Page started IV, BP dropped to 71/52. O2 via [place] @ 2L. 45 ml of NS given. Pt also had a large BM on bedpan. Cleaned and states feeling better.

## 2021-08-24 ENCOUNTER — Encounter (HOSPITAL_COMMUNITY): Payer: Self-pay | Admitting: Internal Medicine

## 2021-08-24 ENCOUNTER — Telehealth: Payer: Self-pay

## 2021-08-24 MED FILL — Lidocaine HCl Local Inj 1%: INTRAMUSCULAR | Qty: 60 | Status: AC

## 2021-08-24 NOTE — Telephone Encounter (Signed)
-----   Message from [place], [place] sent at 08/23/2021  4:55 PM EDT ----- Same day d/c  Biotronik   ICD  GT

## 2021-08-24 NOTE — Telephone Encounter (Signed)
Follow-up after same day discharge: Implant date: 08/23/21 MD: Cristopher Peru, MD Device: Biotronik ICD Location: left chest   Wound check visit: 09/02/21 at 12:00 90 day MD follow-up: 12/10/21 at 2:30  Remote Transmission received:yes  Dressing/sling removed: sling removed, dressing will be removed at the 24 hours mark  Confirm [place] restart on: NA   Successful telephone encounter to patient to follow up s/p ICD implant 08/23/21. Patient states he is doing well with minimal pain or swelling at implant site. Does complain of shoulder stiffness. Reinforced arm, bathing, and driving restriction. Appointments confirmed as above. Patient provided device clinic appointment for additional questions or concerns that may arise.

## 2021-09-02 ENCOUNTER — Ambulatory Visit (INDEPENDENT_AMBULATORY_CARE_PROVIDER_SITE_OTHER): Payer: Medicare HMO

## 2021-09-02 ENCOUNTER — Telehealth: Payer: Self-pay

## 2021-09-02 DIAGNOSIS — I428 Other cardiomyopathies: Secondary | ICD-10-CM | POA: Diagnosis not present

## 2021-09-02 LAB — CUP PACEART INCLINIC DEVICE CHECK
Date Time Interrogation Session: 20230810121903
Implantable Lead Implant Date: 20230731
Implantable Lead Location: 753860
Implantable Lead Model: 436909
Implantable Lead Serial Number: 81532339
Implantable Pulse Generator Implant Date: 20230731
Pulse Gen Model: 429525

## 2021-09-02 NOTE — Patient Instructions (Addendum)
   After Your ICD (Implantable Cardiac Defibrillator)    Monitor your defibrillator site for redness, swelling, and drainage. Call the device clinic at [phone] if you experience these symptoms or fever/chills.  Your incision was closed with Steri-strips or staples:  You may shower 7 days after your procedure and wash your incision with soap and water. Avoid lotions, ointments, or perfumes over your incision until it is well-healed.    You may use a hot tub or a pool after your wound check appointment if the incision is completely closed.  Do not lift, push or pull greater than 10 pounds with the affected arm until 6 weeks after your procedure. There are no other restrictions in arm movement after your wound check appointment.  Your ICD is MRI compatible.  Your ICD is designed to protect you from life threatening heart rhythms. Because of this, you may receive a shock.   1 shock with no symptoms:  Call the office during business hours. 1 shock with symptoms (chest pain, chest pressure, dizziness, lightheadedness, shortness of breath, overall feeling unwell):  Call 911. If you experience 2 or more shocks in 24 hours:  Call 911. If you receive a shock, you should not drive.  [place] DMV - no driving for 6 months if you receive appropriate therapy from your ICD.   ICD Alerts:  Some alerts are vibratory and others beep. These are NOT emergencies. Please call our office to let us know. If this occurs at night or on weekends, it can wait until the next business day. Send a remote transmission.  If your device is capable of reading fluid status (for heart failure), you will be offered monthly monitoring to review this with you.   Remote monitoring is used to monitor your ICD from home. This monitoring is scheduled every 91 days by our office. It allows us to keep an eye on the functioning of your device to ensure it is working properly. You will routinely see your Electrophysiologist annually  (more often if necessary).   

## 2021-09-02 NOTE — Telephone Encounter (Signed)
Patient was seen in device clinic for wound check. Had single ICD placed. Patient had vasovagal episode in office recently. Wants to know if he can drive.

## 2021-09-02 NOTE — Progress Notes (Signed)
Wound check appointment.   Steri-strips removed. Wound without redness or edema. Incision edges approximated, wound well healed. Normal device function. Thresholds, sensing, and impedances consistent with implant measurements. Device programmed at 3.0V for extra safety margin until 3 month visit. Histogram distribution appropriate for patient and level of activity. No mode switches or ventricular arrhythmias noted. Patient educated about wound care, arm mobility, lifting restrictions, shock plan. ROV in 3 months with implanting physician. 

## 2021-09-06 DIAGNOSIS — Z23 Encounter for immunization: Secondary | ICD-10-CM | POA: Diagnosis not present

## 2021-09-07 NOTE — Telephone Encounter (Signed)
Spoke to patients wife, advised per Dr. [name] patient may resume driving 0/7/86. Voiced understanding.

## 2021-09-20 ENCOUNTER — Ambulatory Visit (HOSPITAL_COMMUNITY)
Admission: RE | Admit: 2021-09-20 | Discharge: 2021-09-20 | Disposition: A | Discharge status: Home / Self Care | Payer: Medicare HMO | Source: Ambulatory Visit | Attending: Cardiology | Admitting: Cardiology

## 2021-09-20 ENCOUNTER — Encounter (HOSPITAL_COMMUNITY): Payer: Self-pay

## 2021-09-20 VITALS — BP 92/64 | HR 83 | Wt 192.8 lb

## 2021-09-20 DIAGNOSIS — I2510 Atherosclerotic heart disease of native coronary artery without angina pectoris: Secondary | ICD-10-CM | POA: Diagnosis not present

## 2021-09-20 DIAGNOSIS — Z8572 Personal history of non-Hodgkin lymphomas: Secondary | ICD-10-CM | POA: Insufficient documentation

## 2021-09-20 DIAGNOSIS — Z9221 Personal history of antineoplastic chemotherapy: Secondary | ICD-10-CM | POA: Diagnosis not present

## 2021-09-20 DIAGNOSIS — I130 Hypertensive heart and chronic kidney disease with heart failure and stage 1 through stage 4 chronic kidney disease, or unspecified chronic kidney disease: Secondary | ICD-10-CM | POA: Insufficient documentation

## 2021-09-20 DIAGNOSIS — Z8547 Personal history of malignant neoplasm of testis: Secondary | ICD-10-CM | POA: Diagnosis not present

## 2021-09-20 DIAGNOSIS — Z951 Presence of aortocoronary bypass graft: Secondary | ICD-10-CM | POA: Insufficient documentation

## 2021-09-20 DIAGNOSIS — Z95810 Presence of automatic (implantable) cardiac defibrillator: Secondary | ICD-10-CM | POA: Insufficient documentation

## 2021-09-20 DIAGNOSIS — Z79899 Other long term (current) drug therapy: Secondary | ICD-10-CM | POA: Diagnosis not present

## 2021-09-20 DIAGNOSIS — Z7984 Long term (current) use of oral hypoglycemic drugs: Secondary | ICD-10-CM | POA: Diagnosis not present

## 2021-09-20 DIAGNOSIS — Z7982 Long term (current) use of aspirin: Secondary | ICD-10-CM | POA: Diagnosis not present

## 2021-09-20 DIAGNOSIS — Z923 Personal history of irradiation: Secondary | ICD-10-CM | POA: Diagnosis not present

## 2021-09-20 DIAGNOSIS — I5022 Chronic systolic (congestive) heart failure: Secondary | ICD-10-CM | POA: Insufficient documentation

## 2021-09-20 LAB — BASIC METABOLIC PANEL
Anion gap: 9 (ref 5–15)
BUN: 46 mg/dL — ABNORMAL HIGH (ref 8–23)
CO2: 22 mmol/L (ref 22–32)
Calcium: 9.1 mg/dL (ref 8.9–10.3)
Chloride: 109 mmol/L (ref 98–111)
Creatinine, Ser: 2.44 mg/dL — ABNORMAL HIGH (ref 0.61–1.24)
GFR, Estimated: 27 mL/min — ABNORMAL LOW (ref >60)
Glucose, Bld: 147 mg/dL — ABNORMAL HIGH (ref 70–99)
Potassium: 4.2 mmol/L (ref 3.5–5.1)
Sodium: 140 mmol/L (ref 135–145)

## 2021-09-20 NOTE — Patient Instructions (Addendum)
Thank you for coming in today  Labs were done today, if any labs are abnormal the clinic will call you No news is good news   Your physician recommends that you schedule a follow-up appointment in:  3-4 months with Dr. [name]    Do the following things EVERYDAY: Weigh yourself in the morning before breakfast. Write it down and keep it in a log. Take your medicines as prescribed Eat low salt foods--Limit salt (sodium) to 2000 mg per day.  Stay as active as you can everyday Limit all fluids for the day to less than 2 liters  At the [facility], you and your health needs are our priority. As part of our continuing mission to provide you with exceptional heart care, we have created designated Provider Care Teams. These Care Teams include your primary Cardiologist (physician) and Advanced Practice Providers (APPs- Physician Assistants and Nurse Practitioners) who all work together to provide you with the care you need, when you need it.   You may see any of the following providers on your designated Care Team at your next follow up: Dr Glori Bickers Dr Loralie Champagne Dr. Roxana Hires, NP Lyda Jester, [place] [facility], [place] Forestine Na, NP Audry Riles, PharmD   Please be sure to bring in all your medications bottles to every appointment.   If you have any questions or concerns before your next appointment please send [place] a message through [place] or call our office at [phone].    TO LEAVE A MESSAGE FOR THE NURSE SELECT OPTION 2, PLEASE LEAVE A MESSAGE INCLUDING: YOUR NAME DATE OF BIRTH CALL BACK NUMBER REASON FOR CALL**this is important as we prioritize the call backs  YOU WILL RECEIVE A CALL BACK THE SAME DAY AS LONG AS YOU CALL BEFORE 4:00 PM

## 2021-09-20 NOTE — Progress Notes (Signed)
Advanced Heart Failure Clinic Note   Referring Physician: PCP: Irena Reichmann, DO PCP-Cardiologist: Reatha Harps, MD  [facility]: Dr. Gala Romney   HPI:  Roy [name] is a 76 y.o. 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    Has h/o unexplained syncope. Followed by Dr. Delton See in past for presumed NICM.  Echo 9/16 45-50% with regional wall motion abnormalities in the mid LAD distribution but has had no symptoms of chest pain. In October 2016 had nuclear stress EF 37% with apical HK He had a repeat 2D echo 08/03/15 LVEF 50-55%.     Found to have testicular lymphoma in 10/21.    Admitted 2/22 for HF. Cath with 3v CAD, EF 25%. Underwent 4v CABG with Dr. Vickey Sages.    Underwent left testicular resection (felt to be encapsulated with no evidence of remote spread) + R-CHOP chemo with intrathecal MTX after CABG in 5/22. Now getting XRT to opposite testicle    Has been followed by Dr. Flora Lipps. Has done well with GDMT. NYHA II. However EF  20-25% on repeat echo 10/22.   cRMI 3/23 LVEF 35%, RVEF 37%, RV insertion LGE (nonspecific finding, seen in setting of elevated pulmonary pressures).   7/23, underwent Biotronik single chamber ICD by Dr. Ladona Ridgel.  He presents to clinic today for routine f/u. Here w/ family member.  Doing well, not SOB walking on flat surfaces, w/ mild dyspnea walking up inclines. Denies CP. No LEE, orthopnea/PND nor wt gain. BP soft 90s systolic, c/w home baseline. Reports some positional dizziness, no syncope/ near syncope. Device pocket well healed.   He has finished cancer treatment. Porta cath removed.   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    Cardiac Studies   Cath 2/22 LHC w/ 3V CAD>>CABG  RHC Ao sat 94%, PA sat 66%, PA pressure 27/6, mean PA 15 mm Hg;  mean PCWP 8 mm Hg, CO 7.5 L/min; CI 3.3. Normal right heart pressures.  Echos  -02/2020: 25-30% -4//2022: 20-25% -10/2020: 20-25%  cMRI 3/23  IMPRESSION: 1.  Normal LV size with moderate systolic dysfunction (EF 35%)   2.  Normal RV size with moderate systolic dysfunction (EF 37%)   3. RV insertion site late gadolinium enhancement, which is a nonspecific finding often seen in setting of elevated pulmonary pressures  Review of Systems: [y] = yes, [ ]  = no   General: Weight gain [ ] ; Weight loss [ ] ; Anorexia [ ] ; Fatigue [ ] ; Fever [ ] ; Chills [ ] ; Weakness [ ]   Cardiac: Chest pain/pressure [ ] ; Resting SOB [ ] ; Exertional SOB [Y ]; Orthopnea [ ] ; Pedal Edema [ ] ; Palpitations [ ] ; Syncope [ ] ; Presyncope [ ] ; Paroxysmal nocturnal dyspnea[ ]   Pulmonary: Cough [ ] ; Wheezing[ ] ; Hemoptysis[ ] ; Sputum [ ] ; Snoring [ ]   GI: Vomiting[ ] ; Dysphagia[ ] ; Melena[ ] ; Hematochezia [ ] ; Heartburn[ ] ; Abdominal pain [ ] ; Constipation [ ] ; Diarrhea [ ] ; BRBPR [ ]   GU: Hematuria[ ] ; Dysuria [ ] ; Nocturia[ ]   Vascular: Pain in legs with walking [ ] ; Pain in feet with lying flat [ ] ; Non-healing sores [ ] ; Stroke [ ] ; TIA [ ] ; Slurred speech [ ] ;  Neuro: Headaches[ ] ; Vertigo[ ] ; Seizures[ ] ; Paresthesias[ ] ;Blurred vision [ ] ; Diplopia [ ] ; Vision changes [ ]   Ortho/Skin: Arthritis [ ] ; Joint pain [ ] ; Muscle pain [ ] ; Joint swelling [ ] ; Back Pain [ ] ; Rash [ ]   Psych: Depression[ ] ; Anxiety[ ]   Heme: Bleeding problems [ ] ; Clotting disorders [ ] ; Anemia [ ]   Endocrine: Diabetes [ ] ; Thyroid dysfunction[ ]    Past Medical History:  Diagnosis Date   Allergic rhinitis    Arthritis    wrists   Cardiomyopathy, nonischemic (HCC)    followed by cardiology--- dr [name]---  2016 ef 45-50% ,  2016 nuclear ef 37%,  2017 per echo ef 50-55%   CHF (congestive heart failure), NYHA class III ([place]) 03/21/2020   Coronary artery disease    GERD (gastroesophageal reflux disease)    Hiatal hernia    History of  kidney stones    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;   01/ 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   History of syncope (03-06-2020 pt stated has not had sycopal episode in few years, stated it seems to happen in extreme hot conditions)   cardiologist--- dr [name]--- dx recurrent syncope;  nuclear study 11-03-2014 intermediate risk w/ no ischemia, apical hypokinesis, nuclear ef 37%;  event monitor-- 12-21-2015 SB/ ST  no pauses/ arrythmia's;  echo 08-03-2015 ef 50-55%   Hyperlipidemia    Hypertension    followed by pcp   Hypovitaminosis D    Mass of left testicle    Nocturia    Plantar fasciitis    Presence of surgical incision    01/ 2022  moh's w/ skin graft of nose, per pt still healing and wear bandage daily   Type 2 diabetes mellitus ([place])    pt is adament that he is not and have been told he is a diabetic but a borderline;  followed by pcp, in pcp note states DM2 and takes 2 meds daily   Wears glasses    Wears hearing aid in both ears    Weight loss 11/16/2020    Current Outpatient Medications  Medication Sig Dispense Refill   ACCU-CHEK GUIDE test strip      Apoaequorin (PREVAGEN PO) Take 1 tablet by mouth at bedtime.     aspirin EC 81 MG tablet Take 81 mg by mouth daily. Swallow whole.     atorvastatin (LIPITOR) 40 MG tablet TAKE 1 TABLET BY MOUTH DAILY. 90 tablet 3   B-Complex TABS Take 1 tablet by mouth daily.     Blood Glucose Monitoring Suppl (ACCU-CHEK GUIDE ME) w/Device KIT      Cholecalciferol (VITAMIN D) 50 MCG (2000 UT) CAPS Take 2,000 Units by mouth daily.     empagliflozin (JARDIANCE) 10 MG TABS tablet Take 1 tablet (10 mg total) by mouth daily before breakfast. 90 tablet 3   glucosamine-chondroitin 500-400 MG tablet Take 1 tablet by mouth See admin instructions. 5 times weekly     Lancets 33G MISC See admin instructions.     metFORMIN (GLUCOPHAGE) 1000 MG tablet Take 1,000 mg by mouth 2 (two) times daily with a meal.       metoprolol (TOPROL-XL) 200 MG 24 hr tablet TAKE 1 TABLET (200 MG TOTAL) BY MOUTH DAILY. 90 tablet 3   Multiple Vitamin (MULTIVITAMIN WITH MINERALS) TABS tablet Take 1 tablet by mouth See admin instructions. 5 times weekly     omeprazole (PRILOSEC) 20 MG capsule Take 20 mg by mouth daily.     ondansetron (ZOFRAN-ODT) 8 MG disintegrating tablet Take 8  mg by mouth every 8 (eight) hours as needed for nausea or vomiting.     potassium chloride SA (KLOR-CON M20) 20 MEQ tablet Take 1 tablet (20 mEq total) by mouth daily. 90 tablet 3   sacubitril-valsartan (ENTRESTO) 24-26 MG TAKE 1 TABLET BY MOUTH 2 TIMES DAILY. 180 tablet 3   spironolactone (ALDACTONE) 25 MG tablet TAKE 1/2 TABLET BY MOUTH DAILY. 135 tablet 1   tamsulosin (FLOMAX) 0.4 MG CAPS capsule Take 0.4 mg by mouth at bedtime.     torsemide (DEMADEX) 20 MG tablet Take 20 mg by mouth daily.     vitamin B-12 (CYANOCOBALAMIN) 1000 MCG tablet Take 1,000 mcg by mouth See admin instructions. 5 times a week     Current Facility-Administered Medications  Medication Dose Route Frequency Provider Last Rate Last Admin   potassium chloride (KLOR-CON) CR tablet 20 mEq  20 mEq Oral BID Atkins, Glenice Bow, MD        No Known Allergies    Social History   Socioeconomic History   Marital status: Married    Spouse name: Not on file   Number of children: Not on file   Years of education: Not on file   Highest education level: Not on file  Occupational History   Not on file  Tobacco Use   Smoking status: Former    Types: Pipe    Quit date: 11/29/1976    Years since quitting: 44.8   Smokeless tobacco: Never  Vaping Use   Vaping Use: Never used  Substance and Sexual Activity   Alcohol use: Yes    Alcohol/week: 0.0 standard drinks of alcohol    Comment: Occasional drink    Drug use: Never   Sexual activity: Not on file  Other Topics Concern   Not on file  Social History Narrative   Not on file   Social Determinants of Health   Financial  Resource Strain: Not on file  Food Insecurity: Not on file  Transportation Needs: Not on file  Physical Activity: Not on file  Stress: Not on file  Social Connections: Not on file  Intimate Partner Violence: Not on file      Family History  Problem Relation Age of Onset   Heart attack Mother    Stroke Father    Colon cancer Neg Hx    Esophageal cancer Neg Hx    Pancreatic cancer Neg Hx    Rectal cancer Neg Hx    Stomach cancer Neg Hx    Colon polyps Neg Hx     Vitals:   09/20/21 0920  BP: 92/64  Pulse: 83  SpO2: 98%  Weight: 87.5 kg (192 lb 12.8 oz)     PHYSICAL EXAM: General:  Well appearing, elderly. No respiratory difficulty HEENT: normal Neck: supple. no JVD. Carotids 2+ bilat; no bruits. No lymphadenopathy or thyromegaly appreciated. Cor: PMI nondisplaced. Regular rate & rhythm. No rubs, gallops or murmurs. Lungs: clear Abdomen: soft, nontender, nondistended. No hepatosplenomegaly. No bruits or masses. Good bowel sounds. Extremities: no cyanosis, clubbing, rash, edema Neuro: alert & oriented x 3, cranial nerves grossly intact. moves all 4 extremities w/o difficulty. Affect pleasant.  ECG: Not performed    ASSESSMENT & PLAN:  1. Chronic systolic heart failure ([place]) - h/o presumed NICM dating back to 2016 -Diagnosed with systolic heart failure in February 2022.  EF was 25 to 30%.  Found to have three-vessel CAD.  s/p 4/v CABG 3/22 - Echo 4//2022: EF 20-25% - Echo 10/2020: EF 20-25% -  cRMI 3/23 LVEF 35%, RVEF 37%, RV insertion LGE (nonspecific finding, seen in setting of elevated pulmonary pressures).  - ICD placed 7/23 (Biotronik)  - Had R-CHOP for tx of testicular CA but this came AFTER LV dysfunction so doubt contributing - NYHA II  - Euvolemic on exam. Continue torsemide 20 daily. Can switch to prn as needed - Continue Jardiance 10 mg daily  - Continue Entresto 24-26 mg bid. BP too soft for titration  - Continue Spironolactone 12.5 mg daily. BP too soft  for titration   - Continue Metoprolol succinate 200 mg daily  - Continue Digoxin 0.125 mg daily. Check dig level      2. Coronary artery disease involving coronary bypass graft of native heart without angina pectoris -Status post four-vessel CABG.  No symptoms of angina.   -Continue aspirin 81 mg daily.   -He is on Lipitor 40 mg daily.  LP followed by Dr. [name]. Most recent LDL 74    3. Essential hypertension - Controlled on current regimen - GDMT per above - advised to use caution w/ positional changes to limit orthostatic symptoms - check BMP today    4. Testicular lymphoma - s/p resection and chemo -> completed 11/22 - finished  XRT - under surveillance   F/u w/ Dr. [name] in 3-4 months    Lyda Jester, PA-C 09/20/21

## 2021-09-22 ENCOUNTER — Telehealth (HOSPITAL_COMMUNITY): Payer: Self-pay | Admitting: *Deleted

## 2021-09-23 ENCOUNTER — Telehealth (HOSPITAL_COMMUNITY): Payer: Self-pay

## 2021-09-23 MED ORDER — TORSEMIDE 20 MG PO TABS: 20.0000 mg | ORAL_TABLET | ORAL | 2 refills | Status: DC

## 2021-09-23 MED ORDER — POTASSIUM CHLORIDE CRYS ER 20 MEQ PO TBCR: 20.0000 meq | EXTENDED_RELEASE_TABLET | ORAL | 3 refills | Status: DC

## 2021-09-23 NOTE — Telephone Encounter (Addendum)
Pt aware, agreeable, and verbalized understanding  Med list updated to reflect changes   ----- Message from Consuelo Pandy, PA-C sent at 09/20/2021  1:31 PM EDT ----- SCr elevated, recommend he reduce torsemide and KCl to just 2 days a week, MWF.

## 2021-09-24 NOTE — Telephone Encounter (Signed)
Opened in error

## 2021-10-11 DIAGNOSIS — L821 Other seborrheic keratosis: Secondary | ICD-10-CM | POA: Diagnosis not present

## 2021-10-11 DIAGNOSIS — L578 Other skin changes due to chronic exposure to nonionizing radiation: Secondary | ICD-10-CM | POA: Diagnosis not present

## 2021-10-11 DIAGNOSIS — L814 Other melanin hyperpigmentation: Secondary | ICD-10-CM | POA: Diagnosis not present

## 2021-10-11 DIAGNOSIS — L570 Actinic keratosis: Secondary | ICD-10-CM | POA: Diagnosis not present

## 2021-10-11 DIAGNOSIS — Z85828 Personal history of other malignant neoplasm of skin: Secondary | ICD-10-CM | POA: Diagnosis not present

## 2021-10-23 DIAGNOSIS — I5189 Other ill-defined heart diseases: Secondary | ICD-10-CM | POA: Diagnosis not present

## 2021-10-23 DIAGNOSIS — E7800 Pure hypercholesterolemia, unspecified: Secondary | ICD-10-CM | POA: Diagnosis not present

## 2021-10-23 DIAGNOSIS — E1159 Type 2 diabetes mellitus with other circulatory complications: Secondary | ICD-10-CM | POA: Diagnosis not present

## 2021-10-23 DIAGNOSIS — I10 Essential (primary) hypertension: Secondary | ICD-10-CM | POA: Diagnosis not present

## 2021-11-11 DIAGNOSIS — E1159 Type 2 diabetes mellitus with other circulatory complications: Secondary | ICD-10-CM | POA: Diagnosis not present

## 2021-11-11 DIAGNOSIS — E7801 Familial hypercholesterolemia: Secondary | ICD-10-CM | POA: Diagnosis not present

## 2021-11-11 DIAGNOSIS — D509 Iron deficiency anemia, unspecified: Secondary | ICD-10-CM | POA: Diagnosis not present

## 2021-11-11 DIAGNOSIS — I129 Hypertensive chronic kidney disease with stage 1 through stage 4 chronic kidney disease, or unspecified chronic kidney disease: Secondary | ICD-10-CM | POA: Diagnosis not present

## 2021-11-11 DIAGNOSIS — N1832 Chronic kidney disease, stage 3b: Secondary | ICD-10-CM | POA: Diagnosis not present

## 2021-11-11 DIAGNOSIS — K219 Gastro-esophageal reflux disease without esophagitis: Secondary | ICD-10-CM | POA: Diagnosis not present

## 2021-11-18 DIAGNOSIS — I129 Hypertensive chronic kidney disease with stage 1 through stage 4 chronic kidney disease, or unspecified chronic kidney disease: Secondary | ICD-10-CM | POA: Diagnosis not present

## 2021-11-18 DIAGNOSIS — Z23 Encounter for immunization: Secondary | ICD-10-CM | POA: Diagnosis not present

## 2021-11-18 DIAGNOSIS — E1159 Type 2 diabetes mellitus with other circulatory complications: Secondary | ICD-10-CM | POA: Diagnosis not present

## 2021-11-18 DIAGNOSIS — N1832 Chronic kidney disease, stage 3b: Secondary | ICD-10-CM | POA: Diagnosis not present

## 2021-11-18 DIAGNOSIS — D509 Iron deficiency anemia, unspecified: Secondary | ICD-10-CM | POA: Diagnosis not present

## 2021-11-18 DIAGNOSIS — E7800 Pure hypercholesterolemia, unspecified: Secondary | ICD-10-CM | POA: Diagnosis not present

## 2021-11-18 DIAGNOSIS — Z951 Presence of aortocoronary bypass graft: Secondary | ICD-10-CM | POA: Diagnosis not present

## 2021-11-18 DIAGNOSIS — K219 Gastro-esophageal reflux disease without esophagitis: Secondary | ICD-10-CM | POA: Diagnosis not present

## 2021-11-18 DIAGNOSIS — I428 Other cardiomyopathies: Secondary | ICD-10-CM | POA: Diagnosis not present

## 2021-11-25 ENCOUNTER — Other Ambulatory Visit: Payer: Self-pay

## 2021-11-25 DIAGNOSIS — C8330 Diffuse large B-cell lymphoma, unspecified site: Secondary | ICD-10-CM

## 2021-11-26 ENCOUNTER — Inpatient Hospital Stay: Payer: Medicare HMO

## 2021-11-26 ENCOUNTER — Inpatient Hospital Stay: Payer: Medicare HMO | Attending: Hematology | Admitting: Hematology

## 2021-11-26 ENCOUNTER — Other Ambulatory Visit: Payer: Self-pay

## 2021-11-26 VITALS — BP 130/78 | HR 83 | Temp 97.6°F | Resp 16 | Wt 203.9 lb

## 2021-11-26 DIAGNOSIS — Z85828 Personal history of other malignant neoplasm of skin: Secondary | ICD-10-CM | POA: Insufficient documentation

## 2021-11-26 DIAGNOSIS — K219 Gastro-esophageal reflux disease without esophagitis: Secondary | ICD-10-CM | POA: Insufficient documentation

## 2021-11-26 DIAGNOSIS — C8335 Diffuse large B-cell lymphoma, lymph nodes of inguinal region and lower limb: Secondary | ICD-10-CM | POA: Diagnosis not present

## 2021-11-26 DIAGNOSIS — E785 Hyperlipidemia, unspecified: Secondary | ICD-10-CM | POA: Insufficient documentation

## 2021-11-26 DIAGNOSIS — E119 Type 2 diabetes mellitus without complications: Secondary | ICD-10-CM | POA: Insufficient documentation

## 2021-11-26 DIAGNOSIS — I252 Old myocardial infarction: Secondary | ICD-10-CM | POA: Insufficient documentation

## 2021-11-26 DIAGNOSIS — I10 Essential (primary) hypertension: Secondary | ICD-10-CM | POA: Diagnosis not present

## 2021-11-26 DIAGNOSIS — C8330 Diffuse large B-cell lymphoma, unspecified site: Secondary | ICD-10-CM

## 2021-11-26 DIAGNOSIS — Z9079 Acquired absence of other genital organ(s): Secondary | ICD-10-CM | POA: Diagnosis not present

## 2021-11-26 LAB — CMP (CANCER CENTER ONLY)
ALT: 14 U/L (ref 0–44)
AST: 14 U/L — ABNORMAL LOW (ref 15–41)
Albumin: 4.3 g/dL (ref 3.5–5.0)
Alkaline Phosphatase: 67 U/L (ref 38–126)
Anion gap: 8 (ref 5–15)
BUN: 31 mg/dL — ABNORMAL HIGH (ref 8–23)
CO2: 25 mmol/L (ref 22–32)
Calcium: 9.7 mg/dL (ref 8.9–10.3)
Chloride: 111 mmol/L (ref 98–111)
Creatinine: 1.87 mg/dL — ABNORMAL HIGH (ref 0.61–1.24)
GFR, Estimated: 36 mL/min — ABNORMAL LOW (ref >60)
Glucose, Bld: 88 mg/dL (ref 70–99)
Potassium: 4.7 mmol/L (ref 3.5–5.1)
Sodium: 144 mmol/L (ref 135–145)
Total Bilirubin: 0.5 mg/dL (ref 0.3–1.2)
Total Protein: 7.4 g/dL (ref 6.5–8.1)

## 2021-11-26 LAB — CBC WITH DIFFERENTIAL (CANCER CENTER ONLY)
Abs Immature Granulocytes: 0.05 10*3/uL (ref 0.00–0.07)
Basophils Absolute: 0.1 10*3/uL (ref 0.0–0.1)
Basophils Relative: 1 %
Eosinophils Absolute: 0.2 10*3/uL (ref 0.0–0.5)
Eosinophils Relative: 2 %
HCT: 36.4 % — ABNORMAL LOW (ref 39.0–52.0)
Hemoglobin: 12 g/dL — ABNORMAL LOW (ref 13.0–17.0)
Immature Granulocytes: 1 %
Lymphocytes Relative: 10 %
Lymphs Abs: 1.1 10*3/uL (ref 0.7–4.0)
MCH: 31.3 pg (ref 26.0–34.0)
MCHC: 33 g/dL (ref 30.0–36.0)
MCV: 95 fL (ref 80.0–100.0)
Monocytes Absolute: 0.8 10*3/uL (ref 0.1–1.0)
Monocytes Relative: 8 %
Neutro Abs: 8.4 10*3/uL — ABNORMAL HIGH (ref 1.7–7.7)
Neutrophils Relative %: 78 %
Platelet Count: 276 10*3/uL (ref 150–400)
RBC: 3.83 MIL/uL — ABNORMAL LOW (ref 4.22–5.81)
RDW: 12.3 % (ref 11.5–15.5)
WBC Count: 10.6 10*3/uL — ABNORMAL HIGH (ref 4.0–10.5)
nRBC: 0 % (ref 0.0–0.2)

## 2021-11-26 LAB — LACTATE DEHYDROGENASE: LDH: 151 U/L (ref 98–192)

## 2021-11-29 ENCOUNTER — Other Ambulatory Visit: Payer: Self-pay

## 2021-11-29 MED ORDER — COVID-19 MRNA VAC-TRIS(PFIZER) 30 MCG/0.3ML IM SUSY
PREFILLED_SYRINGE | INTRAMUSCULAR | 0 refills | Status: DC
  Filled 2021-11-29: qty 0.3, 1d supply, fill #0

## 2021-11-30 ENCOUNTER — Encounter: Payer: Self-pay | Admitting: Internal Medicine

## 2021-11-30 ENCOUNTER — Ambulatory Visit: Payer: Medicare HMO | Attending: Internal Medicine | Admitting: Internal Medicine

## 2021-11-30 VITALS — BP 130/70 | HR 78 | Ht 72.0 in | Wt 208.0 lb

## 2021-11-30 DIAGNOSIS — Z95810 Presence of automatic (implantable) cardiac defibrillator: Secondary | ICD-10-CM

## 2021-11-30 DIAGNOSIS — I428 Other cardiomyopathies: Secondary | ICD-10-CM | POA: Diagnosis not present

## 2021-11-30 DIAGNOSIS — I5020 Unspecified systolic (congestive) heart failure: Secondary | ICD-10-CM

## 2021-11-30 NOTE — Patient Instructions (Signed)
Medication Instructions:  Your physician recommends that you continue on your current medications as directed. Please refer to the Current Medication list given to you today.  *If you need a refill on your cardiac medications before your next appointment, please call your pharmacy*  Lab Work: None ordered.  If you have labs (blood work) drawn today and your tests are completely normal, you will receive your results only by: [place] (if you have MyChart) OR A paper copy in the mail If you have any lab test that is abnormal or we need to change your treatment, we will call you to review the results.  Testing/Procedures: None ordered.  Follow-Up: At [facility], you and your health needs are our priority.  As part of our continuing mission to provide you with exceptional heart care, we have created designated Provider Care Teams.  These Care Teams include your primary Cardiologist (physician) and Advanced Practice Providers (APPs -  Physician Assistants and Nurse Practitioners) who all work together to provide you with the care you need, when you need it.  We recommend signing up for the patient portal called "MyChart".  Sign up information is provided on this After Visit Summary.  MyChart is used to connect with patients for Virtual Visits (Telemedicine).  Patients are able to view lab/test results, encounter notes, upcoming appointments, etc.  Non-urgent messages can be sent to your provider as well.   To learn more about what you can do with MyChart, go to [email or web address].    Your next appointment:   1 year(s)  The format for your next appointment:   In Person  Provider:   Cristopher Peru, MD{or one of the following Advanced Practice Providers on your designated Care Team:   Tommye Standard, [place] Legrand Como "Jonni Sanger" Chalmers Cater, [place]  Remote monitoring is used to monitor your ICD from home. This monitoring reduces the number of office visits required to check your device to one  time per year. It allows [place] to keep an eye on the functioning of your device to ensure it is working properly. You are scheduled for a device check from home on 12/02/21. You may send your transmission at any time that day. If you have a wireless device, the transmission will be sent automatically. After your physician reviews your transmission, you will receive a postcard with your next transmission date.  Important Information About Sugar

## 2021-11-30 NOTE — Progress Notes (Signed)
HPI [name] returns today for followup. He is a pleasant 75 yo man with a h/o CAD, s/p CABG, a h/o vasovagal syncope, and LV dysfunction. He has a h/o testicular cancer, s/p chemo and xrt.   No Known Allergies   Current Outpatient Medications  Medication Sig Dispense Refill   ACCU-CHEK GUIDE test strip      Apoaequorin (PREVAGEN PO) Take 1 tablet by mouth at bedtime.     aspirin EC 81 MG tablet Take 81 mg by mouth daily. Swallow whole.     atorvastatin (LIPITOR) 40 MG tablet TAKE 1 TABLET BY MOUTH DAILY. 90 tablet 3   B-Complex TABS Take 1 tablet by mouth daily.     Blood Glucose Monitoring Suppl (ACCU-CHEK GUIDE ME) w/Device KIT      Cholecalciferol (VITAMIN D) 50 MCG (2000 UT) CAPS Take 2,000 Units by mouth daily.     COVID-19 mRNA vaccine 2023-2024 (COMIRNATY) syringe Inject into the muscle. 0.3 mL 0   empagliflozin (JARDIANCE) 10 MG TABS tablet Take 1 tablet (10 mg total) by mouth daily before breakfast. 90 tablet 3   glucosamine-chondroitin 500-400 MG tablet Take 1 tablet by mouth See admin instructions. 5 times weekly     Lancets 33G MISC See admin instructions.     metFORMIN (GLUCOPHAGE) 1000 MG tablet Take 1,000 mg by mouth 2 (two) times daily with a meal.      metoprolol (TOPROL-XL) 200 MG 24 hr tablet TAKE 1 TABLET (200 MG TOTAL) BY MOUTH DAILY. 90 tablet 3   Multiple Vitamin (MULTIVITAMIN WITH MINERALS) TABS tablet Take 1 tablet by mouth See admin instructions. 5 times weekly     omeprazole (PRILOSEC) 20 MG capsule Take 20 mg by mouth daily.     potassium chloride SA (KLOR-CON M20) 20 MEQ tablet Take 1 tablet (20 mEq total) by mouth 3 (three) times a week. 45 tablet 3   sacubitril-valsartan (ENTRESTO) 24-26 MG TAKE 1 TABLET BY MOUTH 2 TIMES DAILY. 180 tablet 3   spironolactone (ALDACTONE) 25 MG tablet TAKE 1/2 TABLET BY MOUTH DAILY. 135 tablet 1   tamsulosin (FLOMAX) 0.4 MG CAPS capsule Take 0.4 mg by mouth at bedtime.     torsemide (DEMADEX) 20 MG tablet Take 1  tablet (20 mg total) by mouth 3 (three) times a week. 60 tablet 2   vitamin B-12 (CYANOCOBALAMIN) 1000 MCG tablet Take 1,000 mcg by mouth See admin instructions. 5 times a week     Current Facility-Administered Medications  Medication Dose Route Frequency Provider Last Rate Last Admin   potassium chloride (KLOR-CON) CR tablet 20 mEq  20 mEq Oral BID [name], MD         Past Medical History:  Diagnosis Date   Allergic rhinitis    Arthritis    wrists   Cardiomyopathy, nonischemic [facility])    followed by cardiology--- dr [name]---  2016 ef 45-50% ,  2016 nuclear ef 37%,  2017 per echo ef 50-55%   CHF (congestive heart failure), NYHA class III ([place]) 03/21/2020   Coronary artery disease    GERD (gastroesophageal reflux disease)    Hiatal hernia    History of kidney stones    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;   01/ 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   History of syncope (03-06-2020 pt stated has not had sycopal episode in few years, stated it seems to happen in extreme hot conditions)  cardiologist--- dr k. nelson--- dx recurrent syncope;  nuclear study 11-03-2014 intermediate risk w/ no ischemia, apical hypokinesis, nuclear ef 37%;  event monitor-- 12-21-2015 SB/ ST  no pauses/ arrythmia's;  echo 08-03-2015 ef 50-55%   Hyperlipidemia    Hypertension    followed by pcp   Hypovitaminosis D    Mass of left testicle    Nocturia    Plantar fasciitis    Presence of surgical incision    01/ 2022  moh's w/ skin graft of nose, per pt still healing and wear bandage daily   Type 2 diabetes mellitus (HCC)    pt is adament that he is not and have been told he is a diabetic but a borderline;  followed by pcp, in pcp note states DM2 and takes 2 meds daily   Wears glasses    Wears hearing aid in both ears    Weight loss 11/16/2020    ROS:   All systems reviewed and negative except as noted in the HPI.   Past Surgical History:  Procedure  Laterality Date   COLONOSCOPY  last one 01-30-2017   CORONARY ARTERY BYPASS GRAFT N/A 03/25/2020   Procedure: CORONARY ARTERY BYPASS GRAFTING (CABG), ON PUMP, TIMES FOUR, USING BILATERAL INTERNAL MAMMARY ARTERIES AND LEFT RADIAL ARTERY;  Surgeon: Atkins, Broadus Z, MD;  Location: [facility] OR;  Service: Open Heart Surgery;  Laterality: N/A;   ICD IMPLANT N/A 08/23/2021   Procedure: ICD IMPLANT;  Surgeon: ,  W, MD;  Location: [facility] INVASIVE CV LAB;  Service: Cardiovascular;  Laterality: N/A;   IR IMAGING GUIDED PORT INSERTION  07/06/2020   LOW ANTERIOR RESECTION RECTUM W/ COLOPROCTOSTOMY  05/2003   MOHS SURGERY  01/2020   nose w/ graft   ORCHIECTOMY Left 03/11/2020   Procedure: ORCHIECTOMY, INGUINAL;  Surgeon: Winter, Christopher Aaron, MD;  Location: [facility];  Service: Urology;  Laterality: Left;  ONLY NEEDS 60 MIN   PORTA CATH REMOVAL N/A 08/23/2021   Procedure: PORTA CATH REMOVAL;  Surgeon: ,  W, MD;  Location: [facility] INVAS[facility];  Service: Cardiovascular;  Laterality: N/A;   RADIAL ARTERY HARVEST Left 03/25/2020   Procedure: LEFT RADIAL ARTERY HARVEST;  Surgeon: Atkins, Broadus Z, MD;  Location: [facility] OR;  Service: Open Heart Surgery;  Laterality: Left;   RIGHT/LEFT HEART CATH AND CORONARY ANGIOGRAPHY N/A 03/23/2020   Procedure: RIGHT/LEFT HEART CATH AND CORONARY ANGIOGRAPHY;  Surgeon: Varanasi, Jayadeep S, MD;  Location: [facility];  Service: Cardiovascular;  Laterality: N/A;   SHOULDER SURGERY Right 1992; 07/ 2021   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 Left 12/24/2008   left ear and nose    TEE WITHOUT CARDIOVERSION N/A 03/25/2020   Procedure: TRANSESOPHAGEAL ECHOCARDIOGRAM (TEE);  Surgeon: Atkins, Broadus Z, MD;  Location: [facility] OR;  Service: Open Heart Surgery;  Laterality: N/A;   TONSILLECTOMY AND ADENOIDECTOMY  child   UPPER GASTROINTESTINAL ENDOSCOPY  last one 06-06-2017     Family History  Problem Relation Age of Onset   Heart attack Mother     Stroke Father    Colon cancer Neg Hx    Esophageal cancer Neg Hx    Pancreatic cancer Neg Hx    Rectal cancer Neg Hx    Stomach cancer Neg Hx    Colon polyps Neg Hx      Social History   Socioeconomic History   Marital status: Married    Spouse name: Not on file   Number of children: Not on   file   Years of education: Not on file   Highest education level: Not on file  Occupational History   Not on file  Tobacco Use   Smoking status: Former    Types: Pipe    Quit date: 11/29/1976    Years since quitting: 45.0   Smokeless tobacco: Never  Vaping Use   Vaping Use: Never used  Substance and Sexual Activity   Alcohol use: Yes    Alcohol/week: 0.0 standard drinks of alcohol    Comment: Occasional drink    Drug use: Never   Sexual activity: Not on file  Other Topics Concern   Not on file  Social History Narrative   Not on file   Social Determinants of Health   Financial Resource Strain: Not on file  Food Insecurity: Not on file  Transportation Needs: Not on file  Physical Activity: Not on file  Stress: Not on file  Social Connections: Not on file  Intimate Partner Violence: Not on file     BP 130/70   Pulse 78   Ht 6' (1.829 m)   Wt 208 lb (94.3 kg)   SpO2 98%   BMI 28.21 kg/m   Physical Exam:  Well appearing NAD HEENT: Unremarkable Neck:  No JVD, no thyromegally Lymphatics:  No adenopathy Back:  No CVA tenderness Lungs:  Clear with no wheezes HEART:  Regular rate rhythm, no murmurs, no rubs, no clicks Abd:  soft, positive bowel sounds, no organomegally, no rebound, no guarding Ext:  2 plus pulses, no edema, no cyanosis, no clubbing Skin:  No rashes no nodules Neuro:  CN II through XII intact, motor grossly intact  EKG - nsr with LVH  DEVICE  Normal device function.  See PaceArt for details.   Assess/Plan:  HFREF with an EF of 20% despite GDMT - he has class 2 symptoms. He will continue his current meds. CAD - he is s/p CABG and denies  anginal symptoms. No change in meds. Syncope - No more spells since he passed out in our office about 4 months ago. ICD - his Biotronik VDD ICD is working normally.  Roy Overlie Shanekqua Schaper,MD

## 2021-12-01 LAB — CUP PACEART INCLINIC DEVICE CHECK
Date Time Interrogation Session: 20231107084207
Implantable Lead Connection Status: 753985
Implantable Lead Implant Date: 20230731
Implantable Lead Location: 753860
Implantable Lead Model: 436909
Implantable Lead Serial Number: 81532339
Implantable Pulse Generator Implant Date: 20230731
Pulse Gen Model: 429525

## 2021-12-02 ENCOUNTER — Encounter: Payer: Self-pay | Admitting: Hematology

## 2021-12-02 ENCOUNTER — Ambulatory Visit (INDEPENDENT_AMBULATORY_CARE_PROVIDER_SITE_OTHER): Payer: Medicare HMO

## 2021-12-02 DIAGNOSIS — I428 Other cardiomyopathies: Secondary | ICD-10-CM | POA: Diagnosis not present

## 2021-12-02 LAB — CUP PACEART REMOTE DEVICE CHECK
Battery Voltage: 3.12 V
Brady Statistic RA Percent Paced: 0 %
Date Time Interrogation Session: 20231109075830
HighPow Impedance: 61 Ohm
Implantable Lead Connection Status: 753985
Implantable Lead Implant Date: 20230731
Implantable Lead Location: 753860
Implantable Lead Model: 436909
Implantable Lead Serial Number: 81532339
Implantable Pulse Generator Implant Date: 20230731
Lead Channel Impedance Value: 541 Ohm
Lead Channel Pacing Threshold Amplitude: 0.4 V
Lead Channel Pacing Threshold Pulse Width: 0.4 ms
Lead Channel Sensing Intrinsic Amplitude: 12.3 mV
Lead Channel Sensing Intrinsic Amplitude: 8 mV
Lead Channel Setting Pacing Amplitude: 2.5 V
Lead Channel Setting Pacing Pulse Width: 0.4 ms
Pulse Gen Model: 429525
Zone Setting Status: 755011

## 2021-12-02 NOTE — Progress Notes (Signed)
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 Patient notes he has been doing well and has no acute new concerns. He had his ICD placed on 08/23/2021.  Patient notes no new lumps or bumps.  No significant change in bowel habits or urination.  No new headaches or focal neurological deficits..  Labs done today were discussed in detail with the patient.  MEDICAL HISTORY:  Past Medical History:  Diagnosis Date   Allergic rhinitis    Arthritis    wrists   Cardiomyopathy, nonischemic [facility])    followed by cardiology--- dr [name]---  2016 ef 45-50% ,  2016 nuclear ef 37%,  2017 per echo ef 50-55%   CHF (congestive heart failure), NYHA class III ([name]) 03/21/2020   Coronary artery disease    GERD (gastroesophageal reflux disease)    Hiatal hernia    History of kidney stones    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;   01/ 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   History of syncope (03-06-2020 pt stated has not had sycopal episode in few years, stated it seems to happen in extreme hot conditions)   cardiologist--- dr [name]--- dx recurrent syncope;  nuclear study 11-03-2014 intermediate risk w/ no ischemia, apical hypokinesis, nuclear ef 37%;  event monitor-- 12-21-2015 SB/ ST  no pauses/ arrythmia's;  echo 08-03-2015 ef 50-55%   Hyperlipidemia    Hypertension    followed by pcp   Hypovitaminosis D    Mass of left testicle    Nocturia    Plantar fasciitis     Presence of surgical incision    01/ 2022  moh's w/ skin graft of nose, per pt still healing and wear bandage daily   Type 2 diabetes mellitus ([place])    pt is adament that he is not and have been told he is a diabetic but a borderline;  followed by pcp, in pcp note states DM2 and takes 2 meds daily   Wears glasses    Wears hearing aid in both ears    Weight loss 11/16/2020    SURGICAL HISTORY: Past Surgical History:  Procedure Laterality Date   COLONOSCOPY  last one 01-30-2017   CORONARY ARTERY BYPASS GRAFT N/A 03/25/2020   Procedure: CORONARY ARTERY BYPASS GRAFTING (CABG), ON PUMP, TIMES FOUR, USING BILATERAL INTERNAL MAMMARY ARTERIES AND LEFT RADIAL ARTERY;  Surgeon: [name], MD;  Location: [place];  Service: Open Heart Surgery;  Laterality: N/A;   ICD IMPLANT N/A 08/23/2021   Procedure: ICD IMPLANT;  Surgeon: [name], MD;  Location: [place] [facility];  Service: Cardiovascular;  Laterality: N/A;   IR IMAGING GUIDED PORT INSERTION  07/06/2020   LOW ANTERIOR RESECTION RECTUM W/ COLOPROCTOSTOMY  05/2003   MOHS SURGERY  01/2020   nose w/ graft   ORCHIECTOMY Left 03/11/2020   Procedure: [place];  Surgeon: [name], MD;  Location: [facility];  Service: Urology;  Laterality: Left;  ONLY NEEDS 60 MIN   PORTA  CATH REMOVAL N/A 08/23/2021   Procedure: PORTA CATH REMOVAL;  Surgeon: [name], MD;  Location: [place] [facility];  Service: Cardiovascular;  Laterality: N/A;   RADIAL ARTERY HARVEST Left 03/25/2020   Procedure: LEFT RADIAL ARTERY HARVEST;  Surgeon: [name], MD;  Location: [place];  Service: Open Heart Surgery;  Laterality: Left;   RIGHT/LEFT HEART CATH AND CORONARY ANGIOGRAPHY N/A 03/23/2020   Procedure: RIGHT/LEFT HEART CATH AND CORONARY ANGIOGRAPHY;  Surgeon: [name], MD;  Location: [place] [facility];  Service: Cardiovascular;  Laterality: N/A;   SHOULDER SURGERY Right 1992; 07/ 2021   squamous cell  carcinoma resection of the left ear Left 12/24/2008   left ear and nose    TEE WITHOUT CARDIOVERSION N/A 03/25/2020   Procedure: TRANSESOPHAGEAL ECHOCARDIOGRAM (TEE);  Surgeon: [name], MD;  Location: [place];  Service: Open Heart Surgery;  Laterality: N/A;   TONSILLECTOMY AND ADENOIDECTOMY  child   UPPER GASTROINTESTINAL ENDOSCOPY  last one 06-06-2017    SOCIAL HISTORY: Social History   Socioeconomic History   Marital status: Married    Spouse name: Not on file   Number of children: Not on file   Years of education: Not on file   Highest education level: Not on file  Occupational History   Not on file  Tobacco Use   Smoking status: Former    Types: Pipe    Quit date: 11/29/1976    Years since quitting: 45.0   Smokeless tobacco: Never  Vaping Use   Vaping Use: Never used  Substance and Sexual Activity   Alcohol use: Yes    Alcohol/week: 0.0 standard drinks of alcohol    Comment: Occasional drink    Drug use: Never   Sexual activity: Not on file  Other Topics Concern   Not on file  Social History Narrative   Not on file   Social Determinants of Health   Financial Resource Strain: Not on file  Food Insecurity: Not on file  Transportation Needs: Not on file  Physical Activity: Not on file  Stress: Not on file  Social Connections: Not on file  Intimate Partner Violence: Not on file    FAMILY HISTORY: Family History  Problem Relation Age of Onset   Heart attack Mother    Stroke Father    Colon cancer Neg Hx    Esophageal cancer Neg Hx    Pancreatic cancer Neg Hx    Rectal cancer Neg Hx    Stomach cancer Neg Hx    Colon polyps Neg Hx     ALLERGIES:  has No Known Allergies.  MEDICATIONS:  Current Outpatient Medications  Medication Sig Dispense Refill   ACCU-CHEK GUIDE test strip      Apoaequorin (PREVAGEN PO) Take 1 tablet by mouth at bedtime.     aspirin EC 81 MG tablet Take 81 mg by mouth daily. Swallow whole.     atorvastatin (LIPITOR) 40 MG  tablet TAKE 1 TABLET BY MOUTH DAILY. 90 tablet 3   B-Complex TABS Take 1 tablet by mouth daily.     Blood Glucose Monitoring Suppl (ACCU-CHEK GUIDE ME) w/Device KIT      Cholecalciferol (VITAMIN D) 50 MCG (2000 UT) CAPS Take 2,000 Units by mouth daily.     COVID-19 mRNA vaccine 2023-2024 (COMIRNATY) syringe Inject into the muscle. 0.3 mL 0   empagliflozin (JARDIANCE) 10 MG TABS tablet Take 1 tablet (10 mg total) by mouth daily before breakfast. 90 tablet 3  glucosamine-chondroitin 500-400 MG tablet Take 1 tablet by mouth See admin instructions. 5 times weekly     Lancets 33G MISC See admin instructions.     metFORMIN (GLUCOPHAGE) 1000 MG tablet Take 1,000 mg by mouth 2 (two) times daily with a meal.      metoprolol (TOPROL-XL) 200 MG 24 hr tablet TAKE 1 TABLET (200 MG TOTAL) BY MOUTH DAILY. 90 tablet 3   Multiple Vitamin (MULTIVITAMIN WITH MINERALS) TABS tablet Take 1 tablet by mouth See admin instructions. 5 times weekly     omeprazole (PRILOSEC) 20 MG capsule Take 20 mg by mouth daily.     potassium chloride SA (KLOR-CON M20) 20 MEQ tablet Take 1 tablet (20 mEq total) by mouth 3 (three) times a week. 45 tablet 3   sacubitril-valsartan (ENTRESTO) 24-26 MG TAKE 1 TABLET BY MOUTH 2 TIMES DAILY. 180 tablet 3   spironolactone (ALDACTONE) 25 MG tablet TAKE 1/2 TABLET BY MOUTH DAILY. 135 tablet 1   tamsulosin (FLOMAX) 0.4 MG CAPS capsule Take 0.4 mg by mouth at bedtime.     torsemide (DEMADEX) 20 MG tablet Take 1 tablet (20 mg total) by mouth 3 (three) times a week. 60 tablet 2   vitamin B-12 (CYANOCOBALAMIN) 1000 MCG tablet Take 1,000 mcg by mouth See admin instructions. 5 times a week     Current Facility-Administered Medications  Medication Dose Route Frequency Provider Last Rate Last Admin   potassium chloride (KLOR-CON) CR tablet 20 mEq  20 mEq Oral BID [name], MD        REVIEW OF SYSTEMS:   10 Point review of Systems was done is negative except as noted above.  PHYSICAL  EXAMINATION: ECOG PERFORMANCE STATUS: 2 - Symptomatic, <50% confined to bed  .BP 130/78 (BP Location: Left Arm, Patient Position: Sitting)   Pulse 83   Temp 97.6 F (36.4 C) (Temporal)   Resp 16   Wt 203 lb 14.4 oz (92.5 kg)   SpO2 100%   BMI 26.90 kg/m  NAD GENERAL:alert, in no acute distress and comfortable SKIN: no acute rashes, no significant lesions EYES: conjunctiva are pink and non-injected, sclera anicteric OROPHARYNX: MMM, no exudates, no oropharyngeal erythema or ulceration NECK: supple, no JVD LYMPH:  no palpable lymphadenopathy in the cervical, axillary or inguinal regions LUNGS: clear to auscultation b/l with normal respiratory effort HEART: regular rate & rhythm ABDOMEN:  normoactive bowel sounds , non tender, not distended. Extremity: no pedal edema PSYCH: alert & oriented x 3 with fluent speech NEURO: no focal motor/sensory deficits  LABORATORY DATA:  I have reviewed the data as listed      Latest Ref Rng & Units 11/26/2021   11:58 AM 08/02/2021   10:12 AM 07/26/2021    1:46 PM  CBC  WBC 4.0 - 10.5 K/uL 10.6  8.6  9.0   Hemoglobin 13.0 - 17.0 g/dL 12.0  11.6  11.6   Hematocrit 39.0 - 52.0 % 36.4  34.8  33.9   Platelets 150 - 400 K/uL 276  234  225     .    Latest Ref Rng & Units 11/26/2021   11:58 AM 09/20/2021    9:42 AM 08/02/2021   10:12 AM  CMP  Glucose 70 - 99 mg/dL 88  147  251   BUN 8 - 23 mg/dL 31  46  41   Creatinine 0.61 - 1.24 mg/dL 1.87  2.44  2.26   Sodium 135 - 145 mmol/L 144  140  138  Potassium 3.5 - 5.1 mmol/L 4.7  4.2  4.2   Chloride 98 - 111 mmol/L 111  109  102   CO2 22 - 32 mmol/L _0 Calcium 8.9 - 10.3 mg/dL 9.7  9.1  9.6   Total Protein 6.5 - 8.1 g/dL 7.4     Total Bilirubin 0.3 - 1.2 mg/dL 0.5     Alkaline Phos 38 - 126 U/L 67     AST 15 - 41 U/L 14     ALT 0 - 44 U/L 14      . Lab Results  Component Value Date   LDH 151 11/26/2021   SURGICAL PATHOLOGY   THIS IS AN ADDENDUM REPORT   CASE: [id number]   PATIENT: Roy [name]  Surgical Pathology Report  Addendum    Reason for Addendum #1:  Molecular Genetic Test Results, FISH   Clinical History: Left testicular mass (crm)      FINAL MICROSCOPIC DIAGNOSIS:   A. TESTICLE, LEFT, ORCHIECTOMY:  -  Diffuse aggressive large B-cell lymphoma  -  See comment    COMMENT:   Sections of testicle show architectural effacement by sheets of large  lymphoid cells with vesicular nuclei and pale cytoplasm.  There is  admixed apoptotic debris and increased mitotic activity.  By  immunohistochemistry, the large lymphoid cells are positive for CD20,  CD5 (dim), BCL6 (dim), MUM1, and BCL2.  They are negative for CD10, CD30  (<1%), cyclin D1, and TdT.  The Ki67 proliferation index is up to  approximately 70%.  CD3 highlights small T-cells in the background. EBV  is negative by in situ hybridization.   Together, the findings support the diagnosis of a diffuse aggressive  large B-cell lymphoma. The differential diagnosis includes a diffuse  large B-cell lymphoma, NOS, with activated B-cell subtype by the [facility]  algorithm and high-grade B-cell lymphoma with MYC and BCL2 or BCL6  rearrangements.  FISH for BCL2, BCL6, and MYC rearrangements will be  performed. Correlation with clinical and radiographic findings is  recommended for consideration of a primary testicular lymphoma.   Result reported to L. Gibson on 03/16/20 at 1720 by S. O'Neill.    ADDENDUM:   FISH RESULTS:   Results: NORMAL   Interpretation:   BCL6 rearrangement:      Not Detected  MYC rearrangement:  Not Detected  MYC amplification:  Not Detected  BCL6 rearrangement:      Not Detected   Please note this testing was performed and interpreted by an outside  facility ([facility]).  This addendum is only being added to provide a  summary of the results for report completeness.  Please see electronic  medical record for a copy of the full report.   RADIOGRAPHIC STUDIES: I  have personally reviewed the radiological images as listed and agreed with the findings in the report. CUP PACEART REMOTE DEVICE CHECK  Result Date: 12/02/2021 Scheduled remote reviewed. Normal device function.  Next remote 91 days. Kathy Breach, RN, CCDS, [employer]  CUP PACEART INCLINIC DEVICE CHECK  Result Date: 12/01/2021 ICD check in clinic. Normal device function. Thresholds and sensing consistent with previous device measurements. Impedance trends stable over time. No mode switches. No ventricular arrhythmias. Histogram distribution appropriate for patient and level of  activity. Device programmed at appropriate safety margins-91 day output reduction to 2.5V at 0.89m. Device programmed to optimize intrinsic conduction. Battery 100%.  Pt enrolled in remote follow-up. Patient education completed including shock plan.JMyrtie Hawk  BSN, RN   ASSESSMENT & PLAN:   76 year old wonderful gentleman with history of hypertension, borderline diabetes, dyslipidemia, GERD with recent STEMI on 03/21/2020 and status post four-vessel CABG on 03/25/2020.  1) Left-sided primary testicular large B-cell lymphoma.- currently in remission S/p ipsilateral orchiectomy S/p 6 cycles of R-CEOP and 3 dose of IT MTX (for CNS prophylaxis) and R Tto contralateral testis. BCL-2, BCL 6, c-Myc negative.  2) history of acute myocardial infarction status post CABG x4 (06/26/2020) 3) nonischemic and ischemic cardiomyopathy ejection fraction 25 to 30% on last echo. 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  LDH within normal limits Lab Results  Component Value Date   LDH 151 11/26/2021  Patient has no clinical or lab evidence of recurrence/progression of his lymphoma. No indication for  additional treatment of the patient's lymphoma at this time. Discussed importance of staying up-to-date with his vaccinations including flu shot, RSV and COVID-19 booster as well as his pneumococcal vaccinations. Continue vitamin D at least 2000 units daily  FOLLOW UP: Return to clinic with Dr. Irene Limbo with labs in 4 months  The total time spent in the appointment was 20 minutes*.  All of the patient's questions were answered with apparent satisfaction. The patient knows to call the clinic with any problems, questions or concerns.   Sullivan Lone MD MS AAHIVMS [facility] Hematology/Oncology Physician [facility]  .*Total Encounter Time as defined by the Centers for Medicare and Medicaid Services includes, in addition to the face-to-face time of a patient visit (documented in the note above) non-face-to-face time: obtaining and reviewing outside history, ordering and reviewing medications, tests or procedures, care coordination (communications with other health care professionals or caregivers) and documentation in the medical record.

## 2021-12-06 DIAGNOSIS — R3912 Poor urinary stream: Secondary | ICD-10-CM | POA: Diagnosis not present

## 2021-12-06 DIAGNOSIS — C6212 Malignant neoplasm of descended left testis: Secondary | ICD-10-CM | POA: Diagnosis not present

## 2021-12-06 DIAGNOSIS — N401 Enlarged prostate with lower urinary tract symptoms: Secondary | ICD-10-CM | POA: Diagnosis not present

## 2021-12-08 ENCOUNTER — Other Ambulatory Visit: Payer: Self-pay

## 2021-12-08 MED ORDER — AREXVY 120 MCG/0.5ML IM SUSR
INTRAMUSCULAR | 0 refills | Status: DC
  Filled 2021-12-09: qty 0.5, 1d supply, fill #0

## 2021-12-09 ENCOUNTER — Other Ambulatory Visit: Payer: Self-pay

## 2021-12-10 NOTE — Progress Notes (Signed)
Remote ICD transmission.   

## 2021-12-13 ENCOUNTER — Other Ambulatory Visit: Payer: Self-pay | Admitting: Cardiovascular Disease

## 2021-12-22 ENCOUNTER — Ambulatory Visit (HOSPITAL_COMMUNITY)
Admission: RE | Admit: 2021-12-22 | Discharge: 2021-12-22 | Disposition: A | Discharge status: Home / Self Care | Payer: Medicare HMO | Source: Ambulatory Visit | Attending: Internal Medicine | Admitting: Internal Medicine

## 2021-12-22 ENCOUNTER — Encounter (HOSPITAL_COMMUNITY): Payer: Self-pay | Admitting: Internal Medicine

## 2021-12-22 VITALS — BP 110/70 | HR 81 | Wt 212.4 lb

## 2021-12-22 DIAGNOSIS — Z7982 Long term (current) use of aspirin: Secondary | ICD-10-CM | POA: Insufficient documentation

## 2021-12-22 DIAGNOSIS — I5022 Chronic systolic (congestive) heart failure: Secondary | ICD-10-CM

## 2021-12-22 DIAGNOSIS — I214 Non-ST elevation (NSTEMI) myocardial infarction: Secondary | ICD-10-CM | POA: Diagnosis not present

## 2021-12-22 DIAGNOSIS — Z923 Personal history of irradiation: Secondary | ICD-10-CM | POA: Insufficient documentation

## 2021-12-22 DIAGNOSIS — E1122 Type 2 diabetes mellitus with diabetic chronic kidney disease: Secondary | ICD-10-CM | POA: Diagnosis not present

## 2021-12-22 DIAGNOSIS — Z951 Presence of aortocoronary bypass graft: Secondary | ICD-10-CM | POA: Insufficient documentation

## 2021-12-22 DIAGNOSIS — Z79899 Other long term (current) drug therapy: Secondary | ICD-10-CM | POA: Diagnosis not present

## 2021-12-22 DIAGNOSIS — Z7984 Long term (current) use of oral hypoglycemic drugs: Secondary | ICD-10-CM | POA: Diagnosis not present

## 2021-12-22 DIAGNOSIS — Z95810 Presence of automatic (implantable) cardiac defibrillator: Secondary | ICD-10-CM | POA: Insufficient documentation

## 2021-12-22 DIAGNOSIS — I130 Hypertensive heart and chronic kidney disease with heart failure and stage 1 through stage 4 chronic kidney disease, or unspecified chronic kidney disease: Secondary | ICD-10-CM | POA: Insufficient documentation

## 2021-12-22 DIAGNOSIS — Z9221 Personal history of antineoplastic chemotherapy: Secondary | ICD-10-CM | POA: Diagnosis not present

## 2021-12-22 DIAGNOSIS — N1832 Chronic kidney disease, stage 3b: Secondary | ICD-10-CM | POA: Insufficient documentation

## 2021-12-22 DIAGNOSIS — I2510 Atherosclerotic heart disease of native coronary artery without angina pectoris: Secondary | ICD-10-CM | POA: Insufficient documentation

## 2021-12-22 DIAGNOSIS — C6290 Malignant neoplasm of unspecified testis, unspecified whether descended or undescended: Secondary | ICD-10-CM | POA: Insufficient documentation

## 2021-12-22 LAB — BASIC METABOLIC PANEL
Anion gap: 8 (ref 5–15)
BUN: 30 mg/dL — ABNORMAL HIGH (ref 8–23)
CO2: 24 mmol/L (ref 22–32)
Calcium: 9.2 mg/dL (ref 8.9–10.3)
Chloride: 110 mmol/L (ref 98–111)
Creatinine, Ser: 1.9 mg/dL — ABNORMAL HIGH (ref 0.61–1.24)
GFR, Estimated: 36 mL/min — ABNORMAL LOW (ref >60)
Glucose, Bld: 129 mg/dL — ABNORMAL HIGH (ref 70–99)
Potassium: 4.3 mmol/L (ref 3.5–5.1)
Sodium: 142 mmol/L (ref 135–145)

## 2021-12-22 LAB — BRAIN NATRIURETIC PEPTIDE: B Natriuretic Peptide: 235.1 pg/mL — ABNORMAL HIGH (ref 0.0–100.0)

## 2021-12-22 NOTE — Addendum Note (Signed)
Encounter addended by: Scarlette Calico, RN on: 12/22/2021 11:26 AM  Actions taken: Clinical Note Signed

## 2021-12-22 NOTE — Addendum Note (Signed)
Encounter addended by: Scarlette Calico, RN on: 12/22/2021 11:31 AM  Actions taken: Order list changed, Diagnosis association updated

## 2021-12-22 NOTE — Patient Instructions (Signed)
Medication Changes:  STOP Digoxin (if you are taking it)  Lab Work:  Labs done today, your results will be available in MyChart, we will contact you for abnormal readings.   Testing/Procedures:  Your physician has requested that you have an echocardiogram. Echocardiography is a painless test that uses sound waves to create images of your heart. It provides your doctor with information about the size and shape of your heart and how well your heart's chambers and valves are working. This procedure takes approximately one hour. There are no restrictions for this procedure. Please do NOT wear cologne, perfume, aftershave, or lotions (deodorant is allowed). Please arrive 15 minutes prior to your appointment time. IN 6 MONTHS WITH FOLLOW UP APPOINTMENT (MAY 2024).  Referrals:  none  Special Instructions // Education:  Do the following things EVERYDAY: Weigh yourself in the morning before breakfast. Write it down and keep it in a log. Take your medicines as prescribed Eat low salt foods--Limit salt (sodium) to 2000 mg per day.  Stay as active as you can everyday Limit all fluids for the day to less than 2 liters   Follow-Up in: 6 months with an echocardiogram (May 2024), **PLEASE CALL OUR OFFICE IN MARCH TO SCHEDULE THIS APPOINTMENT  At [facility], you and your health needs are our priority. We have a designated team specialized in the treatment of Heart Failure. This Care Team includes your primary Heart Failure Specialized Cardiologist (physician), Advanced Practice Providers (APPs- Physician Assistants and Nurse Practitioners), and Pharmacist who all work together to provide you with the care you need, when you need it.   You may see any of the following providers on your designated Care Team at your next follow up:  Dr. Glori Bickers Dr. Loralie Champagne Dr. Roxana Hires, NP Lyda Jester, [facility] [place], [place] Forestine Na, NP Audry Riles, PharmD   Please be sure to bring in all your medications bottles to every appointment.   Need to Contact us:  If you have any questions or concerns before your next appointment please send [place] a message through [place] or call our office at [phone].    TO LEAVE A MESSAGE FOR THE NURSE SELECT OPTION 2, PLEASE LEAVE A MESSAGE INCLUDING: YOUR NAME DATE OF BIRTH CALL BACK NUMBER REASON FOR CALL**this is important as we prioritize the call backs  YOU WILL RECEIVE A CALL BACK THE SAME DAY AS LONG AS YOU CALL BEFORE 4:00 PM

## 2021-12-22 NOTE — Addendum Note (Signed)
Encounter addended by: Scarlette Calico, RN on: 12/22/2021 11:22 AM  Actions taken: Diagnosis association updated, Order list changed

## 2021-12-22 NOTE — Progress Notes (Signed)
Advanced Heart Failure Clinic Note   Referring Physician: PCP: [name], DO PCP-Cardiologist: [name], MD  [facility]: Dr. [name]   HPI:  Roy [name] is a 76 y.o. 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    Has h/o unexplained syncope. Followed by Dr. [name] in past for presumed NICM.  Echo 9/16 45-50% with regional wall motion abnormalities in the mid LAD distribution but has had no symptoms of chest pain. In October 2016 had nuclear stress EF 37% with apical HK He had a repeat 2D echo 08/03/15 LVEF 50-55%.     Found to have testicular lymphoma in 10/21.    Admitted 2/22 for HF. Cath with 3v CAD, EF 25%. Underwent 4v CABG with Dr. [name].    Underwent left testicular resection (felt to be encapsulated with no evidence of remote spread) + R-CHOP chemo with intrathecal MTX after CABG in 5/22. Then got XRT to opposite testicle. He has finished cancer treatment. Porta cath removed.    Has been followed by Dr. [name]. Has done well with GDMT. NYHA II. However EF  20-25% on repeat echo 10/22.   cRMI 3/23 LVEF 35%, RVEF 37%, RV insertion LGE (nonspecific finding, seen in setting of elevated pulmonary pressures).   7/23, underwent Biotronik single chamber ICD by Dr. [name].  He is here for f/u. Says he is doing pretty well. Has good mornings and bad mornings but overall stable. Denies CP or SOB. No edema, orthopnea or PND. Able to do ADLs without problem.   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    Cardiac Studies   Cath 2/22 LHC w/ 3V CAD>>CABG  RHC Ao sat 94%, PA sat 66%, PA pressure 27/6, mean PA 15 mm Hg; mean PCWP 8 mm Hg, CO 7.5 L/min; CI 3.3. Normal right heart pressures.  Echos  -02/2020: 25-30% -4//2022: 20-25% -10/2020: 20-25%  cMRI 3/23   IMPRESSION: 1.  Normal LV size with moderate systolic dysfunction (EF 58%)   2.  Normal RV size with moderate systolic dysfunction (EF 30%)   3. RV insertion site late gadolinium enhancement, which is a nonspecific finding often seen in setting of elevated pulmonary pressures     Past Medical History:  Diagnosis Date   Allergic rhinitis    Arthritis    wrists   Cardiomyopathy, nonischemic ([place])    followed by cardiology--- dr [name]---  2016 ef 45-50% ,  2016 nuclear ef 37%,  2017 per echo ef 50-55%   CHF (congestive heart failure), NYHA class III ([place]) 03/21/2020   Coronary artery disease    GERD (gastroesophageal reflux disease)    Hiatal hernia    History of kidney stones    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;   01/ 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   History of syncope (03-06-2020 pt stated has not had sycopal episode in few years, stated it seems to happen in extreme hot conditions)   cardiologist--- dr [name]--- dx recurrent syncope;  nuclear study 11-03-2014 intermediate risk w/ no ischemia, apical hypokinesis, nuclear ef 37%;  event monitor-- 12-21-2015 SB/ ST  no pauses/ arrythmia's;  echo 08-03-2015 ef 50-55%   Hyperlipidemia    Hypertension    followed by pcp   Hypovitaminosis D  Mass of left testicle    Nocturia    Plantar fasciitis    Presence of surgical incision    01/ 2022  moh's w/ skin graft of nose, per pt still healing and wear bandage daily   Type 2 diabetes mellitus ([place])    pt is adament that he is not and have been told he is a diabetic but a borderline;  followed by pcp, in pcp note states DM2 and takes 2 meds daily   Wears glasses    Wears hearing aid in both ears    Weight loss 11/16/2020    Current Outpatient Medications  Medication Sig Dispense Refill   ACCU-CHEK GUIDE test strip      Apoaequorin (PREVAGEN PO) Take 1 tablet by mouth at bedtime.     aspirin EC 81 MG tablet Take 81 mg by mouth  daily. Swallow whole.     atorvastatin (LIPITOR) 40 MG tablet TAKE 1 TABLET BY MOUTH DAILY. 90 tablet 3   B-Complex TABS Take 1 tablet by mouth daily.     Blood Glucose Monitoring Suppl (ACCU-CHEK GUIDE ME) w/Device KIT      Cholecalciferol (VITAMIN D) 50 MCG (2000 UT) CAPS Take 2,000 Units by mouth daily.     glucosamine-chondroitin 500-400 MG tablet Take 1 tablet by mouth See admin instructions. 5 times weekly     JARDIANCE 10 MG TABS tablet TAKE 1 TABLET BY MOUTH DAILY BEFORE BREAKFAST. 30 tablet 3   Lancets 33G MISC See admin instructions.     metFORMIN (GLUCOPHAGE) 1000 MG tablet Take 1,000 mg by mouth 2 (two) times daily with a meal.      metoprolol (TOPROL-XL) 200 MG 24 hr tablet TAKE 1 TABLET (200 MG TOTAL) BY MOUTH DAILY. 90 tablet 3   Multiple Vitamin (MULTIVITAMIN WITH MINERALS) TABS tablet Take 1 tablet by mouth See admin instructions. 5 times weekly     omeprazole (PRILOSEC) 20 MG capsule Take 20 mg by mouth daily.     potassium chloride SA (KLOR-CON M20) 20 MEQ tablet Take 1 tablet (20 mEq total) by mouth 3 (three) times a week. 45 tablet 3   sacubitril-valsartan (ENTRESTO) 24-26 MG TAKE 1 TABLET BY MOUTH 2 TIMES DAILY. 180 tablet 3   spironolactone (ALDACTONE) 25 MG tablet TAKE 1/2 TABLET BY MOUTH DAILY. 135 tablet 1   tamsulosin (FLOMAX) 0.4 MG CAPS capsule Take 0.4 mg by mouth at bedtime.     torsemide (DEMADEX) 20 MG tablet Take 1 tablet (20 mg total) by mouth 3 (three) times a week. 60 tablet 2   vitamin B-12 (CYANOCOBALAMIN) 1000 MCG tablet Take 1,000 mcg by mouth See admin instructions. 5 times a week     Current Facility-Administered Medications  Medication Dose Route Frequency Provider Last Rate Last Admin   potassium chloride (KLOR-CON) CR tablet 20 mEq  20 mEq Oral BID Atkins, Glenice Bow, MD        No Known Allergies    Social History   Socioeconomic History   Marital status: Married    Spouse name: Not on file   Number of children: Not on file   Years of  education: Not on file   Highest education level: Not on file  Occupational History   Not on file  Tobacco Use   Smoking status: Former    Types: Pipe    Quit date: 11/29/1976    Years since quitting: 45.0   Smokeless tobacco: Never  Vaping Use   Vaping Use: Never used  Substance and Sexual Activity   Alcohol use: Yes    Alcohol/week: 0.0 standard drinks of alcohol    Comment: Occasional drink    Drug use: Never   Sexual activity: Not on file  Other Topics Concern   Not on file  Social History Narrative   Not on file   Social Determinants of Health   Financial Resource Strain: Not on file  Food Insecurity: Not on file  Transportation Needs: Not on file  Physical Activity: Not on file  Stress: Not on file  Social Connections: Not on file  Intimate Partner Violence: Not on file      Family History  Problem Relation Age of Onset   Heart attack Mother    Stroke Father    Colon cancer Neg Hx    Esophageal cancer Neg Hx    Pancreatic cancer Neg Hx    Rectal cancer Neg Hx    Stomach cancer Neg Hx    Colon polyps Neg Hx     Vitals:   12/22/21 1033  BP: 110/70  Pulse: 81  SpO2: 100%  Weight: 96.3 kg (212 lb 6.4 oz)    PHYSICAL EXAM: General:  Elderly Well appearing. No resp difficulty HEENT: normal Neck: supple. no JVD. Carotids 2+ bilat; no bruits. No lymphadenopathy or thryomegaly appreciated. Cor: PMI nondisplaced. Regular rate & rhythm. No rubs, gallops or murmurs. Lungs: clear Abdomen: soft, nontender, nondistended. No hepatosplenomegaly. No bruits or masses. Good bowel sounds. Extremities: no cyanosis, clubbing, rash, edema Neuro: alert & orientedx3, cranial nerves grossly intact. moves all 4 extremities w/o difficulty. Affect pleasant  ASSESSMENT & PLAN:  1. Chronic systolic heart failure ([place]) - h/o presumed NICM dating back to 2016 -Diagnosed with systolic heart failure in February 2022.  EF was 25 to 30%.  Found to have three-vessel CAD.  s/p 4/v  CABG 3/22 - Echo 4//2022: EF 20-25% - Echo 10/2020: EF 20-25% - cRMI 3/23 LVEF 35%, RVEF 37%, RV insertion LGE (nonspecific finding, seen in setting of elevated pulmonary pressures).  - ICD placed 7/23 (Biotronik)  - Had R-CHOP for tx of testicular CA but this came AFTER LV dysfunction so doubt contributing - Stable NYHA II  - Euvolemic on exam. Continue torsemide 20 daily. Can switch to prn as needed - Continue Jardiance 10 mg daily  - Continue Entresto 24-26 mg bid. BP too soft for titration  - Continue Spironolactone 12.5 mg daily. BP too soft for titration   - Continue Metoprolol succinate 200 mg daily  - Stop digoxin - Repeat echo in 4 months   2. Coronary artery disease involving coronary bypass graft of native heart without angina pectoris -Status post four-vessel CABG.   - No s/s angina -Continue aspirin 81 mg daily.   -He is on Lipitor 40 mg daily.  LP followed by Dr. [name]. Most recent LDL 74   3. Essential hypertension - Controlled on current regimen - GDMT per above   4. Testicular lymphoma - s/p resection and chemo -> completed 11/22 - finished  XRT - under surveillance   Glori Bickers, MD 12/22/21

## 2021-12-23 DIAGNOSIS — I5189 Other ill-defined heart diseases: Secondary | ICD-10-CM | POA: Diagnosis not present

## 2021-12-23 DIAGNOSIS — E7800 Pure hypercholesterolemia, unspecified: Secondary | ICD-10-CM | POA: Diagnosis not present

## 2021-12-23 DIAGNOSIS — I10 Essential (primary) hypertension: Secondary | ICD-10-CM | POA: Diagnosis not present

## 2021-12-23 DIAGNOSIS — E1159 Type 2 diabetes mellitus with other circulatory complications: Secondary | ICD-10-CM | POA: Diagnosis not present

## 2022-01-03 ENCOUNTER — Other Ambulatory Visit: Payer: Self-pay | Admitting: Cardiovascular Disease

## 2022-02-28 ENCOUNTER — Other Ambulatory Visit: Payer: Self-pay

## 2022-02-28 DIAGNOSIS — C8330 Diffuse large B-cell lymphoma, unspecified site: Secondary | ICD-10-CM

## 2022-03-01 ENCOUNTER — Other Ambulatory Visit: Payer: Self-pay

## 2022-03-01 ENCOUNTER — Inpatient Hospital Stay: Payer: Medicare HMO | Admitting: Hematology

## 2022-03-01 ENCOUNTER — Inpatient Hospital Stay: Payer: Medicare HMO | Attending: Hematology

## 2022-03-01 VITALS — BP 110/68 | HR 82 | Temp 97.9°F | Resp 17 | Ht 72.0 in | Wt 212.2 lb

## 2022-03-01 DIAGNOSIS — I252 Old myocardial infarction: Secondary | ICD-10-CM | POA: Insufficient documentation

## 2022-03-01 DIAGNOSIS — Z8572 Personal history of non-Hodgkin lymphomas: Secondary | ICD-10-CM | POA: Insufficient documentation

## 2022-03-01 DIAGNOSIS — E1165 Type 2 diabetes mellitus with hyperglycemia: Secondary | ICD-10-CM | POA: Diagnosis not present

## 2022-03-01 DIAGNOSIS — K219 Gastro-esophageal reflux disease without esophagitis: Secondary | ICD-10-CM | POA: Insufficient documentation

## 2022-03-01 DIAGNOSIS — C8330 Diffuse large B-cell lymphoma, unspecified site: Secondary | ICD-10-CM

## 2022-03-01 DIAGNOSIS — I10 Essential (primary) hypertension: Secondary | ICD-10-CM | POA: Diagnosis not present

## 2022-03-01 DIAGNOSIS — Z85828 Personal history of other malignant neoplasm of skin: Secondary | ICD-10-CM | POA: Insufficient documentation

## 2022-03-01 DIAGNOSIS — E785 Hyperlipidemia, unspecified: Secondary | ICD-10-CM | POA: Insufficient documentation

## 2022-03-01 DIAGNOSIS — I255 Ischemic cardiomyopathy: Secondary | ICD-10-CM | POA: Diagnosis not present

## 2022-03-01 LAB — CBC WITH DIFFERENTIAL (CANCER CENTER ONLY)
Abs Immature Granulocytes: 0.03 10*3/uL (ref 0.00–0.07)
Basophils Absolute: 0.1 10*3/uL (ref 0.0–0.1)
Basophils Relative: 1 %
Eosinophils Absolute: 0.2 10*3/uL (ref 0.0–0.5)
Eosinophils Relative: 2 %
HCT: 34.6 % — ABNORMAL LOW (ref 39.0–52.0)
Hemoglobin: 11.4 g/dL — ABNORMAL LOW (ref 13.0–17.0)
Immature Granulocytes: 0 %
Lymphocytes Relative: 10 %
Lymphs Abs: 1 10*3/uL (ref 0.7–4.0)
MCH: 30.6 pg (ref 26.0–34.0)
MCHC: 32.9 g/dL (ref 30.0–36.0)
MCV: 92.8 fL (ref 80.0–100.0)
Monocytes Absolute: 0.6 10*3/uL (ref 0.1–1.0)
Monocytes Relative: 6 %
Neutro Abs: 7.9 10*3/uL — ABNORMAL HIGH (ref 1.7–7.7)
Neutrophils Relative %: 81 %
Platelet Count: 195 10*3/uL (ref 150–400)
RBC: 3.73 MIL/uL — ABNORMAL LOW (ref 4.22–5.81)
RDW: 12.8 % (ref 11.5–15.5)
WBC Count: 9.8 10*3/uL (ref 4.0–10.5)
nRBC: 0 % (ref 0.0–0.2)

## 2022-03-01 LAB — CMP (CANCER CENTER ONLY)
ALT: 13 U/L (ref 0–44)
AST: 15 U/L (ref 15–41)
Albumin: 4.1 g/dL (ref 3.5–5.0)
Alkaline Phosphatase: 67 U/L (ref 38–126)
Anion gap: 10 (ref 5–15)
BUN: 32 mg/dL — ABNORMAL HIGH (ref 8–23)
CO2: 21 mmol/L — ABNORMAL LOW (ref 22–32)
Calcium: 9.4 mg/dL (ref 8.9–10.3)
Chloride: 109 mmol/L (ref 98–111)
Creatinine: 2.01 mg/dL — ABNORMAL HIGH (ref 0.61–1.24)
GFR, Estimated: 33 mL/min — ABNORMAL LOW (ref >60)
Glucose, Bld: 199 mg/dL — ABNORMAL HIGH (ref 70–99)
Potassium: 4.1 mmol/L (ref 3.5–5.1)
Sodium: 140 mmol/L (ref 135–145)
Total Bilirubin: 0.6 mg/dL (ref 0.3–1.2)
Total Protein: 6.5 g/dL (ref 6.5–8.1)

## 2022-03-01 LAB — LACTATE DEHYDROGENASE: LDH: 147 U/L (ref 98–192)

## 2022-03-01 NOTE — Progress Notes (Signed)
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  Patient was last seen by me on 11/26/2021 and was doing well overall.   Patient is accompanied by his wife during this visit. He reports he has been doing well without any new medical concerns. Patient notes that the cold weather bothers him, but he is doing well.  He denies fever, chills, night sweats, headaches, new lumps/bumps, infection issues, testicular pain, testicular swelling, back pain, abdominal pain, chest pain, or leg swelling.   Patient notes he has been sleeping better since our last visit, he notes he gets around 8-9 hours of sleep.   He notes that his blood sugar this morning was around 145 and is well-controlled with his medications.   Patient denies any new skin lesions or skin rashes. He continues to follow-up with his dermatologist. He does reports of two skin lesions one of each ear, which has not changed. He also notes that he has one skin lesion near his nose for about 3 years, without any changes.    MEDICAL HISTORY:  Past Medical History:  Diagnosis Date   Allergic rhinitis    Arthritis    wrists   Cardiomyopathy, nonischemic [facility])    followed by cardiology--- dr [name]---  2016 ef 45-50% ,  2016 nuclear ef 37%,  2017 per echo ef 50-55%   CHF (congestive heart failure), NYHA class III ([place]) 03/21/2020   Coronary artery disease    GERD (gastroesophageal reflux disease)    Hiatal hernia    History of kidney stones    History of  squamous cell carcinoma excision    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;   01/ 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   History of syncope (03-06-2020 pt stated has not had sycopal episode in few years, stated it seems to happen in extreme hot conditions)   cardiologist--- dr [name]--- dx recurrent syncope;  nuclear study 11-03-2014 intermediate risk w/ no ischemia, apical hypokinesis, nuclear ef 37%;  event monitor-- 12-21-2015 SB/ ST  no pauses/ arrythmia's;  echo 08-03-2015 ef 50-55%   Hyperlipidemia    Hypertension    followed by pcp   Hypovitaminosis D    Mass of left testicle    Nocturia    Plantar fasciitis    Presence of surgical incision    01/ 2022  moh's w/ skin graft of nose, per pt still healing and wear bandage daily   Type 2 diabetes mellitus ([place])    pt is adament that he is not and have been told he is a diabetic but a borderline;  followed by pcp, in pcp note states DM2 and takes 2 meds daily   Wears glasses    Wears hearing aid in both ears    Weight loss 11/16/2020    SURGICAL HISTORY: Past Surgical History:  Procedure Laterality Date   COLONOSCOPY  last one 01-30-2017   CORONARY ARTERY BYPASS GRAFT N/A 03/25/2020   Procedure: CORONARY ARTERY BYPASS GRAFTING (CABG), ON PUMP, TIMES FOUR, USING BILATERAL INTERNAL MAMMARY ARTERIES AND LEFT RADIAL  ARTERY;  Surgeon: [name], MD;  Location: [facility];  Service: Open Heart Surgery;  Laterality: N/A;   ICD IMPLANT N/A 08/23/2021   Procedure: ICD IMPLANT;  Surgeon: [name], MD;  Location: [facility];  Service: Cardiovascular;  Laterality: N/A;   IR IMAGING GUIDED PORT INSERTION  07/06/2020   LOW ANTERIOR RESECTION RECTUM W/ COLOPROCTOSTOMY  05/2003   MOHS SURGERY  01/2020   nose w/ graft   ORCHIECTOMY Left 03/11/2020   Procedure: [place];  Surgeon: [name], MD;  Location[facility];  Service: Urology;  Laterality: Left;  ONLY NEEDS 60 MIN   PORTA CATH REMOVAL N/A  08/23/2021   Procedure: PORTA CATH REMOVAL;  Surgeon: [name], MD;  Location: [facility];  Service: Cardiovascular;  Laterality: N/A;   RADIAL ARTERY HARVEST Left 03/25/2020   Procedure: LEFT RADIAL ARTERY HARVEST;  Surgeon: [name], MD;  Location: [facility];  Service: Open Heart Surgery;  Laterality: Left;   RIGHT/LEFT HEART CATH AND CORONARY ANGIOGRAPHY N/A 03/23/2020   Procedure: RIGHT/LEFT HEART CATH AND CORONARY ANGIOGRAPHY;  Surgeon: [name], MD;  Location: [facility];  Service: Cardiovascular;  Laterality: N/A;   SHOULDER SURGERY Right 1992; 07/ 2021   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 Left 12/24/2008   left ear and nose    TEE WITHOUT CARDIOVERSION N/A 03/25/2020   Procedure: TRANSESOPHAGEAL ECHOCARDIOGRAM (TEE);  Surgeon: [name], MD;  Location: [facility];  Service: Open Heart Surgery;  Laterality: N/A;   TONSILLECTOMY AND ADENOIDECTOMY  child   UPPER GASTROINTESTINAL ENDOSCOPY  last one 06-06-2017    SOCIAL HISTORY: Social History   Socioeconomic History   Marital status: Married    Spouse name: Not on file   Number of children: Not on file   Years of education: Not on file   Highest education level: Not on file  Occupational History   Not on file  Tobacco Use   Smoking status: Former    Types: Pipe    Quit date: 11/29/1976    Years since quitting: 45.2   Smokeless tobacco: Never  Vaping Use   Vaping Use: Never used  Substance and Sexual Activity   Alcohol use: Yes    Alcohol/week: 0.0 standard drinks of alcohol    Comment: Occasional drink    Drug use: Never   Sexual activity: Not on file  Other Topics Concern   Not on file  Social History Narrative   Not on file   Social Determinants of Health   Financial Resource Strain: Not on file  Food Insecurity: Not on file  Transportation Needs: Not on file  Physical Activity: Not on file  Stress: Not on file  Social Connections: Not on file  Intimate  Partner Violence: Not on file    FAMILY HISTORY: Family History  Problem Relation Age of Onset   Heart attack Mother    Stroke Father    Colon cancer Neg Hx    Esophageal cancer Neg Hx    Pancreatic cancer Neg Hx    Rectal cancer Neg Hx    Stomach cancer Neg Hx    Colon polyps Neg Hx     ALLERGIES:  has No Known Allergies.  MEDICATIONS:  Current Outpatient Medications  Medication Sig Dispense Refill   ACCU-CHEK GUIDE test strip      Apoaequorin (PREVAGEN PO) Take 1 tablet by mouth at bedtime.  aspirin EC 81 MG tablet Take 81 mg by mouth daily. Swallow whole.     atorvastatin (LIPITOR) 40 MG tablet TAKE 1 TABLET BY MOUTH DAILY. 90 tablet 3   B-Complex TABS Take 1 tablet by mouth daily.     Blood Glucose Monitoring Suppl (ACCU-CHEK GUIDE ME) w/Device KIT      Cholecalciferol (VITAMIN D) 50 MCG (2000 UT) CAPS Take 2,000 Units by mouth daily.     glucosamine-chondroitin 500-400 MG tablet Take 1 tablet by mouth See admin instructions. 5 times weekly     JARDIANCE 10 MG TABS tablet TAKE 1 TABLET BY MOUTH DAILY BEFORE BREAKFAST. 30 tablet 3   Lancets 33G MISC See admin instructions.     metFORMIN (GLUCOPHAGE) 1000 MG tablet Take 1,000 mg by mouth 2 (two) times daily with a meal.      metoprolol (TOPROL-XL) 200 MG 24 hr tablet TAKE 1 TABLET (200 MG TOTAL) BY MOUTH DAILY. 90 tablet 3   Multiple Vitamin (MULTIVITAMIN WITH MINERALS) TABS tablet Take 1 tablet by mouth See admin instructions. 5 times weekly     omeprazole (PRILOSEC) 20 MG capsule Take 20 mg by mouth daily.     potassium chloride SA (KLOR-CON M20) 20 MEQ tablet Take 1 tablet (20 mEq total) by mouth 3 (three) times a week. 45 tablet 3   sacubitril-valsartan (ENTRESTO) 24-26 MG TAKE 1 TABLET BY MOUTH 2 TIMES DAILY. 180 tablet 3   spironolactone (ALDACTONE) 25 MG tablet TAKE 1/2 TABLET BY MOUTH DAILY. 45 tablet 3   tamsulosin (FLOMAX) 0.4 MG CAPS capsule Take 0.4 mg by mouth at bedtime.     torsemide (DEMADEX) 20 MG tablet  Take 1 tablet (20 mg total) by mouth 3 (three) times a week. 60 tablet 2   vitamin B-12 (CYANOCOBALAMIN) 1000 MCG tablet Take 1,000 mcg by mouth See admin instructions. 5 times a week     No current facility-administered medications for this visit.    REVIEW OF SYSTEMS:   10 Point review of Systems was done is negative except as noted above.  PHYSICAL EXAMINATION: ECOG PERFORMANCE STATUS: 2 - Symptomatic, <50% confined to bed  .BP 110/68 (BP Location: Left Arm, Patient Position: Sitting)   Pulse 82   Temp 97.9 F (36.6 C) (Temporal)   Resp 17   Ht 6' (1.829 m)   Wt 212 lb 3.2 oz (96.3 kg)   SpO2 100%   BMI 28.78 kg/m  NAD GENERAL:alert, in no acute distress and comfortable SKIN: no acute rashes, no significant lesions EYES: conjunctiva are pink and non-injected, sclera anicteric OROPHARYNX: MMM, no exudates, no oropharyngeal erythema or ulceration NECK: supple, no JVD LYMPH:  no palpable lymphadenopathy in the cervical, axillary or inguinal regions LUNGS: clear to auscultation b/l with normal respiratory effort HEART: regular rate & rhythm ABDOMEN:  normoactive bowel sounds , non tender, not distended. Extremity: no pedal edema PSYCH: alert & oriented x 3 with fluent speech NEURO: no focal motor/sensory deficits  LABORATORY DATA:  I have reviewed the data as listed      Latest Ref Rng & Units 03/01/2022    9:55 AM 11/26/2021   11:58 AM 08/02/2021   10:12 AM  CBC  WBC 4.0 - 10.5 K/uL 9.8  10.6  8.6   Hemoglobin 13.0 - 17.0 g/dL 11.4  12.0  11.6   Hematocrit 39.0 - 52.0 % 34.6  36.4  34.8   Platelets 150 - 400 K/uL 195  276  234     .  Latest Ref Rng & Units 03/01/2022    9:55 AM 12/22/2021   11:20 AM 11/26/2021   11:58 AM  CMP  Glucose 70 - 99 mg/dL 199  129  88   BUN 8 - 23 mg/dL 32  30  31   Creatinine 0.61 - 1.24 mg/dL 2.01  1.90  1.87   Sodium 135 - 145 mmol/L 140  142  144   Potassium 3.5 - 5.1 mmol/L 4.1  4.3  4.7   Chloride 98 - 111 mmol/L 109  110   111   CO2 22 - 32 mmol/L 21  24  25   $ Calcium 8.9 - 10.3 mg/dL 9.4  9.2  9.7   Total Protein 6.5 - 8.1 g/dL 6.5   7.4   Total Bilirubin 0.3 - 1.2 mg/dL 0.6   0.5   Alkaline Phos 38 - 126 U/L 67   67   AST 15 - 41 U/L 15   14   ALT 0 - 44 U/L 13   14    . Lab Results  Component Value Date   LDH 151 11/26/2021   SURGICAL PATHOLOGY   THIS IS AN ADDENDUM REPORT   CASE: [id number]  PATIENT: Roy [name]  Surgical Pathology Report  Addendum    Reason for Addendum #1:  Molecular Genetic Test Results, FISH   Clinical History: Left testicular mass (crm)      FINAL MICROSCOPIC DIAGNOSIS:   A. TESTICLE, LEFT, ORCHIECTOMY:  -  Diffuse aggressive large B-cell lymphoma  -  See comment    COMMENT:   Sections of testicle show architectural effacement by sheets of large  lymphoid cells with vesicular nuclei and pale cytoplasm.  There is  admixed apoptotic debris and increased mitotic activity.  By  immunohistochemistry, the large lymphoid cells are positive for CD20,  CD5 (dim), BCL6 (dim), MUM1, and BCL2.  They are negative for CD10, CD30  (<1%), cyclin D1, and TdT.  The Ki67 proliferation index is up to  approximately 70%.  CD3 highlights small T-cells in the background. EBV  is negative by in situ hybridization.   Together, the findings support the diagnosis of a diffuse aggressive  large B-cell lymphoma. The differential diagnosis includes a diffuse  large B-cell lymphoma, NOS, with activated B-cell subtype by the [facility]  algorithm and high-grade B-cell lymphoma with MYC and BCL2 or BCL6  rearrangements.  FISH for BCL2, BCL6, and MYC rearrangements will be  performed. Correlation with clinical and radiographic findings is  recommended for consideration of a primary testicular lymphoma.   Result reported to L. Gibson on 03/16/20 at 1720 by S. O'Neill.    ADDENDUM:   FISH RESULTS:   Results: NORMAL   Interpretation:   BCL6 rearrangement:      Not Detected  MYC  rearrangement:  Not Detected  MYC amplification:  Not Detected  BCL6 rearrangement:      Not Detected   Please note this testing was performed and interpreted by an outside  facility ([facility]).  This addendum is only being added to provide a  summary of the results for report completeness.  Please see electronic  medical record for a copy of the full report.   RADIOGRAPHIC STUDIES: I have personally reviewed the radiological images as listed and agreed with the findings in the report. No results found.  ASSESSMENT & PLAN:   77 year old wonderful gentleman with history of hypertension, borderline diabetes, dyslipidemia, GERD with recent STEMI on 03/21/2020 and status post four-vessel CABG on  03/25/2020.  1) Left-sided primary testicular large B-cell lymphoma.- currently in remission S/p ipsilateral orchiectomy S/p 6 cycles of R-CEOP and 3 dose of IT MTX (for CNS prophylaxis) and R Tto contralateral testis. BCL-2, BCL 6, c-Myc negative.  2) history of acute myocardial infarction status post CABG x4 (06/26/2020) 3) nonischemic and ischemic cardiomyopathy ejection fraction 25 to 30% on last echo. 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 CBC shows hemoglobin of 11.4 and hematocrit was 34.6. CMP shows elevated blood glucose of 199 and elevated creatine of 2.01.  -Answered all of patient's questions. -Patient has no clinical or lab evidence of recurrence/progression of his lymphoma. -No indication for additional treatment of the patient's lymphoma at this time. -Continue vitamin D at least 2000 units daily   FOLLOW-UP: RTC with Dr Irene Limbo with labs in 4 months  The total time spent in the appointment was 23 minutes* .  All of the patient's questions were answered  with apparent satisfaction. The patient knows to call the clinic with any problems, questions or concerns.   Sullivan Lone MD MS AAHIVMS [facility] Hematology/Oncology Physician [facility]  .*Total Encounter Time as defined by the Centers for Medicare and Medicaid Services includes, in addition to the face-to-face time of a patient visit (documented in the note above) non-face-to-face time: obtaining and reviewing outside history, ordering and reviewing medications, tests or procedures, care coordination (communications with other health care professionals or caregivers) and documentation in the medical record.   I, Cleda Mccreedy, am acting as a Education administrator for Sullivan Lone, MD. .I have reviewed the above documentation for accuracy and completeness, and I agree with the above. Brunetta Genera MD

## 2022-03-02 LAB — CUP PACEART REMOTE DEVICE CHECK
Date Time Interrogation Session: 20240207081059
Implantable Lead Connection Status: 753985
Implantable Lead Implant Date: 20230731
Implantable Lead Location: 753860
Implantable Lead Model: 436909
Implantable Lead Serial Number: 81532339
Implantable Pulse Generator Implant Date: 20230731
Pulse Gen Model: 429525

## 2022-03-03 ENCOUNTER — Ambulatory Visit (INDEPENDENT_AMBULATORY_CARE_PROVIDER_SITE_OTHER): Payer: Medicare HMO

## 2022-03-03 DIAGNOSIS — I428 Other cardiomyopathies: Secondary | ICD-10-CM

## 2022-03-07 ENCOUNTER — Encounter: Payer: Self-pay | Admitting: Hematology

## 2022-03-09 ENCOUNTER — Telehealth: Payer: Self-pay | Admitting: Hematology

## 2022-03-09 NOTE — Telephone Encounter (Signed)
Per 2/13 IB scheduled patient to schedule lab and follow up with Dr Irene Limbo. Patient aware and confirmed.

## 2022-03-23 NOTE — Progress Notes (Signed)
Remote ICD transmission.   

## 2022-04-05 ENCOUNTER — Other Ambulatory Visit: Payer: Self-pay | Admitting: Cardiovascular Disease

## 2022-04-18 ENCOUNTER — Other Ambulatory Visit: Payer: Self-pay | Admitting: Cardiovascular Disease

## 2022-05-17 DIAGNOSIS — Z951 Presence of aortocoronary bypass graft: Secondary | ICD-10-CM | POA: Diagnosis not present

## 2022-05-17 DIAGNOSIS — E1159 Type 2 diabetes mellitus with other circulatory complications: Secondary | ICD-10-CM | POA: Diagnosis not present

## 2022-05-17 DIAGNOSIS — I129 Hypertensive chronic kidney disease with stage 1 through stage 4 chronic kidney disease, or unspecified chronic kidney disease: Secondary | ICD-10-CM | POA: Diagnosis not present

## 2022-05-17 DIAGNOSIS — K219 Gastro-esophageal reflux disease without esophagitis: Secondary | ICD-10-CM | POA: Diagnosis not present

## 2022-05-17 DIAGNOSIS — I428 Other cardiomyopathies: Secondary | ICD-10-CM | POA: Diagnosis not present

## 2022-05-17 DIAGNOSIS — N1832 Chronic kidney disease, stage 3b: Secondary | ICD-10-CM | POA: Diagnosis not present

## 2022-05-17 DIAGNOSIS — Z125 Encounter for screening for malignant neoplasm of prostate: Secondary | ICD-10-CM | POA: Diagnosis not present

## 2022-05-17 DIAGNOSIS — D509 Iron deficiency anemia, unspecified: Secondary | ICD-10-CM | POA: Diagnosis not present

## 2022-05-20 ENCOUNTER — Other Ambulatory Visit: Payer: Self-pay | Admitting: Cardiovascular Disease

## 2022-05-24 DIAGNOSIS — Z951 Presence of aortocoronary bypass graft: Secondary | ICD-10-CM | POA: Diagnosis not present

## 2022-05-24 DIAGNOSIS — N1832 Chronic kidney disease, stage 3b: Secondary | ICD-10-CM | POA: Diagnosis not present

## 2022-05-24 DIAGNOSIS — Z0000 Encounter for general adult medical examination without abnormal findings: Secondary | ICD-10-CM | POA: Diagnosis not present

## 2022-05-24 DIAGNOSIS — K219 Gastro-esophageal reflux disease without esophagitis: Secondary | ICD-10-CM | POA: Diagnosis not present

## 2022-05-24 DIAGNOSIS — D631 Anemia in chronic kidney disease: Secondary | ICD-10-CM | POA: Diagnosis not present

## 2022-05-24 DIAGNOSIS — E1159 Type 2 diabetes mellitus with other circulatory complications: Secondary | ICD-10-CM | POA: Diagnosis not present

## 2022-05-24 DIAGNOSIS — I129 Hypertensive chronic kidney disease with stage 1 through stage 4 chronic kidney disease, or unspecified chronic kidney disease: Secondary | ICD-10-CM | POA: Diagnosis not present

## 2022-05-24 DIAGNOSIS — I428 Other cardiomyopathies: Secondary | ICD-10-CM | POA: Diagnosis not present

## 2022-06-02 ENCOUNTER — Ambulatory Visit (INDEPENDENT_AMBULATORY_CARE_PROVIDER_SITE_OTHER): Payer: Medicare HMO

## 2022-06-02 DIAGNOSIS — I428 Other cardiomyopathies: Secondary | ICD-10-CM

## 2022-06-02 LAB — CUP PACEART REMOTE DEVICE CHECK
Date Time Interrogation Session: 20240509072319
Implantable Lead Connection Status: 753985
Implantable Lead Implant Date: 20230731
Implantable Lead Location: 753860
Implantable Lead Model: 436909
Implantable Lead Serial Number: 81532339
Implantable Pulse Generator Implant Date: 20230731
Pulse Gen Model: 429525

## 2022-06-08 ENCOUNTER — Other Ambulatory Visit: Payer: Self-pay | Admitting: Cardiovascular Disease

## 2022-06-21 NOTE — Progress Notes (Signed)
Remote ICD transmission.   

## 2022-06-22 ENCOUNTER — Telehealth: Payer: Self-pay | Admitting: Cardiovascular Disease

## 2022-06-22 MED ORDER — METOPROLOL SUCCINATE ER 200 MG PO TB24: 200.0000 mg | ORAL_TABLET | Freq: Every day | ORAL | 0 refills | Status: DC

## 2022-06-22 NOTE — Telephone Encounter (Signed)
*  STAT* If patient is at the pharmacy, call can be transferred to refill team.   1. Which medications need to be refilled? (please list name of each medication and dose if known)   metoprolol (TOPROL-XL) 200 MG 24 hr tablet   2. Which pharmacy/location (including street and city if local pharmacy) is medication to be sent to?  [facility] - [place], [place] - [address]   3. Do they need a 30 day or 90 day supply?   90 day  Patient stated he still has some medication left.  Patient has appointment on 8/9.

## 2022-06-22 NOTE — Telephone Encounter (Signed)
Refills has been sent to the pharmacy. 

## 2022-07-07 ENCOUNTER — Other Ambulatory Visit: Payer: Self-pay

## 2022-07-07 DIAGNOSIS — C8330 Diffuse large B-cell lymphoma, unspecified site: Secondary | ICD-10-CM

## 2022-07-08 ENCOUNTER — Other Ambulatory Visit: Payer: Self-pay

## 2022-07-08 ENCOUNTER — Inpatient Hospital Stay: Payer: Medicare HMO | Attending: Hematology | Admitting: Hematology

## 2022-07-08 ENCOUNTER — Inpatient Hospital Stay: Payer: Medicare HMO

## 2022-07-08 VITALS — BP 113/71 | HR 81 | Resp 18 | Ht 72.0 in | Wt 210.5 lb

## 2022-07-08 DIAGNOSIS — C8330 Diffuse large B-cell lymphoma, unspecified site: Secondary | ICD-10-CM | POA: Diagnosis not present

## 2022-07-08 DIAGNOSIS — I110 Hypertensive heart disease with heart failure: Secondary | ICD-10-CM | POA: Diagnosis not present

## 2022-07-08 DIAGNOSIS — I252 Old myocardial infarction: Secondary | ICD-10-CM | POA: Diagnosis not present

## 2022-07-08 DIAGNOSIS — Z8572 Personal history of non-Hodgkin lymphomas: Secondary | ICD-10-CM | POA: Diagnosis not present

## 2022-07-08 DIAGNOSIS — Z87891 Personal history of nicotine dependence: Secondary | ICD-10-CM | POA: Insufficient documentation

## 2022-07-08 LAB — CBC WITH DIFFERENTIAL (CANCER CENTER ONLY)
Abs Immature Granulocytes: 0.03 10*3/uL (ref 0.00–0.07)
Basophils Absolute: 0.1 10*3/uL (ref 0.0–0.1)
Basophils Relative: 1 %
Eosinophils Absolute: 0.1 10*3/uL (ref 0.0–0.5)
Eosinophils Relative: 1 %
HCT: 37.6 % — ABNORMAL LOW (ref 39.0–52.0)
Hemoglobin: 12.2 g/dL — ABNORMAL LOW (ref 13.0–17.0)
Immature Granulocytes: 0 %
Lymphocytes Relative: 12 %
Lymphs Abs: 1.2 10*3/uL (ref 0.7–4.0)
MCH: 30.2 pg (ref 26.0–34.0)
MCHC: 32.4 g/dL (ref 30.0–36.0)
MCV: 93.1 fL (ref 80.0–100.0)
Monocytes Absolute: 0.7 10*3/uL (ref 0.1–1.0)
Monocytes Relative: 7 %
Neutro Abs: 7.6 10*3/uL (ref 1.7–7.7)
Neutrophils Relative %: 79 %
Platelet Count: 224 10*3/uL (ref 150–400)
RBC: 4.04 MIL/uL — ABNORMAL LOW (ref 4.22–5.81)
RDW: 13.2 % (ref 11.5–15.5)
WBC Count: 9.7 10*3/uL (ref 4.0–10.5)
nRBC: 0 % (ref 0.0–0.2)

## 2022-07-08 LAB — CMP (CANCER CENTER ONLY)
ALT: 14 U/L (ref 0–44)
AST: 15 U/L (ref 15–41)
Albumin: 4.2 g/dL (ref 3.5–5.0)
Alkaline Phosphatase: 68 U/L (ref 38–126)
Anion gap: 12 (ref 5–15)
BUN: 28 mg/dL — ABNORMAL HIGH (ref 8–23)
CO2: 18 mmol/L — ABNORMAL LOW (ref 22–32)
Calcium: 9 mg/dL (ref 8.9–10.3)
Chloride: 109 mmol/L (ref 98–111)
Creatinine: 2.02 mg/dL — ABNORMAL HIGH (ref 0.61–1.24)
GFR, Estimated: 33 mL/min — ABNORMAL LOW (ref >60)
Glucose, Bld: 197 mg/dL — ABNORMAL HIGH (ref 70–99)
Potassium: 3.7 mmol/L (ref 3.5–5.1)
Sodium: 139 mmol/L (ref 135–145)
Total Bilirubin: 0.8 mg/dL (ref 0.3–1.2)
Total Protein: 6.9 g/dL (ref 6.5–8.1)

## 2022-07-08 LAB — LACTATE DEHYDROGENASE: LDH: 138 U/L (ref 98–192)

## 2022-07-08 NOTE — Progress Notes (Signed)
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  Patient was last seen by me on 03/01/2022 and reported 1 skin lesions on each ear and 1 skin lesion near his nose, but was otherwise doing well overall.   Today, patient is accompanied by his wife. He notes he is doing well overall, though he reports limited energy. Patient reports needing to take frequent rests between activities. He notes his EF was recently measured at 35%, improved from a previously 20%.   He complained of one episode of phantom pain in the scrotum that lasted 2 minutes. He notes he had to stop what he was currently doing and had to wait for it to subside. He notes it is occurring less frequently and is not persistent. He was informed that this symptom is a possible side effect from his previous surgery.  He reports he is taking Potassium supplements 3x a week, which is a change in medication.   Patient complains of neck pain when lying down. He denies any other changes in medication, any new lumps/bumps, headaches, new neurological symptoms, fever, chills, night sweats, abdominal pain, leg swelling, or skin rashes. Patient also denies having to use his defibrillator pacemaker.  Patient reports that he recently biopsied a spot on his left forearm and top of his head. The spot on his left forearm is scaly and began a couple months ago. He also notes spots in his nose and left posterior leg. He notes he sees his urologist and dermatologist annually. He  does note easily bruising since childhood.   Patient notes he has been going outside and is staying active. He previously lifts weights and walks on the treadmill.  MEDICAL HISTORY:  Past Medical History:  Diagnosis Date   Allergic rhinitis    Arthritis    wrists   Cardiomyopathy, nonischemic [facility])    followed by cardiology--- dr Delton See---  2016 ef 45-50% ,  2016 nuclear ef 37%,  2017 per echo ef 50-55%   CHF (congestive heart failure), NYHA class III (HCC) 03/21/2020   Coronary artery disease    GERD (gastroesophageal reflux disease)    Hiatal hernia    History of kidney stones    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;   01/ 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   History of syncope (03-06-2020 pt stated has not had sycopal episode in few years, stated it seems to happen in extreme hot conditions)   cardiologist--- dr Eloy End--- dx recurrent syncope;  nuclear study 11-03-2014 intermediate risk w/ no ischemia, apical hypokinesis, nuclear ef 37%;  event monitor-- 12-21-2015 SB/ ST  no pauses/ arrythmia's;  echo 08-03-2015 ef 50-55%   Hyperlipidemia    Hypertension    followed by pcp   Hypovitaminosis D    Mass of left testicle    Nocturia    Plantar fasciitis    Presence of surgical incision    01/ 2022  moh's w/ skin graft of nose, per pt still healing and wear bandage daily  Type 2 diabetes mellitus (HCC)    pt is adament that he is not and have been told he is a diabetic but a borderline;  followed by pcp, in pcp note states DM2 and takes 2 meds daily   Wears glasses    Wears hearing aid in both ears    Weight loss 11/16/2020    SURGICAL HISTORY: Past Surgical History:  Procedure Laterality Date   COLONOSCOPY  last one 01-30-2017   CORONARY ARTERY BYPASS GRAFT N/A 03/25/2020   Procedure: CORONARY ARTERY BYPASS GRAFTING (CABG), ON PUMP, TIMES FOUR, USING BILATERAL INTERNAL MAMMARY ARTERIES AND LEFT RADIAL ARTERY;  Surgeon: Linden Dolin, MD;  Location: [facility] OR;  Service: Open Heart Surgery;  Laterality: N/A;   ICD IMPLANT N/A 08/23/2021   Procedure: ICD IMPLANT;  Surgeon: Marinus Maw, MD;  Location: [facility] INVASIVE CV LAB;  Service: Cardiovascular;  Laterality: N/A;   IR IMAGING GUIDED PORT INSERTION  07/06/2020   LOW ANTERIOR RESECTION RECTUM W/ COLOPROCTOSTOMY  05/2003   MOHS SURGERY  01/2020   nose w/ graft   ORCHIECTOMY Left 03/11/2020   Procedure: Clearance Coots;  Surgeon: Rene Paci, MD;  Location: [facility];  Service: Urology;  Laterality: Left;  ONLY NEEDS 60 MIN   PORTA CATH REMOVAL N/A 08/23/2021   Procedure: PORTA CATH REMOVAL;  Surgeon: Marinus Maw, MD;  Location: [facility] INVASIVE CV LAB;  Service: Cardiovascular;  Laterality: N/A;   RADIAL ARTERY HARVEST Left 03/25/2020   Procedure: LEFT RADIAL ARTERY HARVEST;  Surgeon: Linden Dolin, MD;  Location: [facility] OR;  Service: Open Heart Surgery;  Laterality: Left;   RIGHT/LEFT HEART CATH AND CORONARY ANGIOGRAPHY N/A 03/23/2020   Procedure: RIGHT/LEFT HEART CATH AND CORONARY ANGIOGRAPHY;  Surgeon: Corky Crafts, MD;  Location: [facility] INVASIVE CV LAB;  Service: Cardiovascular;  Laterality: N/A;   SHOULDER SURGERY Right 1992; 07/ 2021   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 Left 12/24/2008   left ear and nose    TEE WITHOUT CARDIOVERSION N/A 03/25/2020   Procedure: TRANSESOPHAGEAL ECHOCARDIOGRAM (TEE);  Surgeon: Linden Dolin, MD;  Location: [facility] OR;  Service: Open Heart Surgery;  Laterality: N/A;   TONSILLECTOMY AND ADENOIDECTOMY  child   UPPER GASTROINTESTINAL ENDOSCOPY  last one 06-06-2017    SOCIAL HISTORY: Social History   Socioeconomic History   Marital status: Married    Spouse name: Not on file   Number of children: Not on file   Years of education: Not on file   Highest education level: Not on file  Occupational History   Not on file  Tobacco Use   Smoking status: Former    Types: Pipe    Quit  date: 11/29/1976    Years since quitting: 45.6   Smokeless tobacco: Never  Vaping Use   Vaping Use: Never used  Substance and Sexual Activity   Alcohol use: Yes    Alcohol/week: 0.0 standard drinks of alcohol    Comment: Occasional drink    Drug use: Never   Sexual activity: Not on file  Other Topics Concern   Not on file  Social History Narrative   Not on file   Social Determinants of Health   Financial Resource Strain: Not on file  Food Insecurity: Not on file  Transportation Needs: Not on file  Physical Activity: Not on file  Stress: Not on file  Social Connections: Not on file  Intimate Partner Violence: Not on file    FAMILY HISTORY:  Family History  Problem Relation Age of Onset   Heart attack Mother    Stroke Father    Colon cancer Neg Hx    Esophageal cancer Neg Hx    Pancreatic cancer Neg Hx    Rectal cancer Neg Hx    Stomach cancer Neg Hx    Colon polyps Neg Hx     ALLERGIES:  has No Known Allergies.  MEDICATIONS:  Current Outpatient Medications  Medication Sig Dispense Refill   ACCU-CHEK GUIDE test strip      Apoaequorin (PREVAGEN PO) Take 1 tablet by mouth at bedtime.     aspirin EC 81 MG tablet Take 81 mg by mouth daily. Swallow whole.     atorvastatin (LIPITOR) 40 MG tablet TAKE 1 TABLET BY MOUTH DAILY. 90 tablet 3   B-Complex TABS Take 1 tablet by mouth daily.     Blood Glucose Monitoring Suppl (ACCU-CHEK GUIDE ME) w/Device KIT      Cholecalciferol (VITAMIN D) 50 MCG (2000 UT) CAPS Take 2,000 Units by mouth daily.     ENTRESTO 24-26 MG TAKE 1 TABLET BY MOUTH 2 TIMES DAILY. 180 tablet 3   glucosamine-chondroitin 500-400 MG tablet Take 1 tablet by mouth See admin instructions. 5 times weekly     JARDIANCE 10 MG TABS tablet TAKE 1 TABLET BY MOUTH DAILY BEFORE BREAKFAST. 30 tablet 3   Lancets 33G MISC See admin instructions.     metFORMIN (GLUCOPHAGE) 1000 MG tablet Take 1,000 mg by mouth 2 (two) times daily with a meal.      metoprolol (TOPROL-XL)  200 MG 24 hr tablet Take 1 tablet (200 mg total) by mouth daily. 90 tablet 0   Multiple Vitamin (MULTIVITAMIN WITH MINERALS) TABS tablet Take 1 tablet by mouth See admin instructions. 5 times weekly     omeprazole (PRILOSEC) 20 MG capsule Take 20 mg by mouth daily.     potassium chloride SA (KLOR-CON M) 20 MEQ tablet TAKE 1 TABLET (20 MEQ TOTAL) BY MOUTH DAILY. 90 tablet 3   spironolactone (ALDACTONE) 25 MG tablet TAKE 1/2 TABLET BY MOUTH DAILY. 45 tablet 3   tamsulosin (FLOMAX) 0.4 MG CAPS capsule Take 0.4 mg by mouth at bedtime.     torsemide (DEMADEX) 20 MG tablet Take 1 tablet (20 mg total) by mouth 3 (three) times a week. 60 tablet 2   vitamin B-12 (CYANOCOBALAMIN) 1000 MCG tablet Take 1,000 mcg by mouth See admin instructions. 5 times a week     No current facility-administered medications for this visit.    REVIEW OF SYSTEMS:   10 Point review of Systems was done is negative except as noted above.   PHYSICAL EXAMINATION: ECOG PERFORMANCE STATUS: 2 - Symptomatic, <50% confined to bed  .BP 113/71 (BP Location: Left Arm, Patient Position: Sitting)   Pulse 81   Resp 18   Ht 6' (1.829 m)   Wt 210 lb 8 oz (95.5 kg)   SpO2 100%   BMI 28.55 kg/m   GENERAL:alert, in no acute distress and comfortable SKIN: no acute rashes, no significant lesions EYES: conjunctiva are pink and non-injected, sclera anicteric OROPHARYNX: MMM, no exudates, no oropharyngeal erythema or ulceration NECK: supple, no JVD LYMPH:  no palpable lymphadenopathy in the cervical, axillary or inguinal regions LUNGS: clear to auscultation b/l with normal respiratory effort HEART: regular rate & rhythm ABDOMEN:  normoactive bowel sounds , non tender, not distended. Extremity: no pedal edema PSYCH: alert & oriented x 3 with fluent speech NEURO: no focal  motor/sensory deficits  LABORATORY DATA:  I have reviewed the data as listed      Latest Ref Rng & Units 07/08/2022   10:09 AM 03/01/2022    9:55 AM 11/26/2021    11:58 AM  CBC  WBC 4.0 - 10.5 K/uL 9.7  9.8  10.6   Hemoglobin 13.0 - 17.0 g/dL 40.9  81.1  91.4   Hematocrit 39.0 - 52.0 % 37.6  34.6  36.4   Platelets 150 - 400 K/uL 224  195  276     .    Latest Ref Rng & Units 07/08/2022   10:09 AM 03/01/2022    9:55 AM 12/22/2021   11:20 AM  CMP  Glucose 70 - 99 mg/dL 782  956  213   BUN 8 - 23 mg/dL 28  32  30   Creatinine 0.61 - 1.24 mg/dL 0.86  5.78  4.69   Sodium 135 - 145 mmol/L 139  140  142   Potassium 3.5 - 5.1 mmol/L 3.7  4.1  4.3   Chloride 98 - 111 mmol/L 109  109  110   CO2 22 - 32 mmol/L 18  21  24    Calcium 8.9 - 10.3 mg/dL 9.0  9.4  9.2   Total Protein 6.5 - 8.1 g/dL 6.9  6.5    Total Bilirubin 0.3 - 1.2 mg/dL 0.8  0.6    Alkaline Phos 38 - 126 U/L 68  67    AST 15 - 41 U/L 15  15    ALT 0 - 44 U/L 14  13     . Lab Results  Component Value Date   LDH 147 03/01/2022   SURGICAL PATHOLOGY   THIS IS AN ADDENDUM REPORT   CASE: [id number]  PATIENT: Johntavious Abebe  Surgical Pathology Report  Addendum    Reason for Addendum #1:  Molecular Genetic Test Results, FISH   Clinical History: Left testicular mass (crm)      FINAL MICROSCOPIC DIAGNOSIS:   A. TESTICLE, LEFT, ORCHIECTOMY:  -  Diffuse aggressive large B-cell lymphoma  -  See comment    COMMENT:   Sections of testicle show architectural effacement by sheets of large  lymphoid cells with vesicular nuclei and pale cytoplasm.  There is  admixed apoptotic debris and increased mitotic activity.  By  immunohistochemistry, the large lymphoid cells are positive for CD20,  CD5 (dim), BCL6 (dim), MUM1, and BCL2.  They are negative for CD10, CD30  (<1%), cyclin D1, and TdT.  The Ki67 proliferation index is up to  approximately 70%.  CD3 highlights small T-cells in the background. EBV  is negative by in situ hybridization.   Together, the findings support the diagnosis of a diffuse aggressive  large B-cell lymphoma. The differential diagnosis includes a diffuse   large B-cell lymphoma, NOS, with activated B-cell subtype by the [facility]  algorithm and high-grade B-cell lymphoma with MYC and BCL2 or BCL6  rearrangements.  FISH for BCL2, BCL6, and MYC rearrangements will be  performed. Correlation with clinical and radiographic findings is  recommended for consideration of a primary testicular lymphoma.   Result reported to L. Gibson on 03/16/20 at 1720 by S. O'Neill.    ADDENDUM:   FISH RESULTS:   Results: NORMAL   Interpretation:   BCL6 rearrangement:      Not Detected  MYC rearrangement:  Not Detected  MYC amplification:  Not Detected  BCL6 rearrangement:      Not Detected   Please note  this testing was performed and interpreted by an outside  facility ([facility]).  This addendum is only being added to provide a  summary of the results for report completeness.  Please see electronic  medical record for a copy of the full report.   RADIOGRAPHIC STUDIES: I have personally reviewed the radiological images as listed and agreed with the findings in the report. No results found.  ASSESSMENT & PLAN:   77 year old wonderful gentleman with history of hypertension, borderline diabetes, dyslipidemia, GERD with recent STEMI on 03/21/2020 and status post four-vessel CABG on 03/25/2020.  1) Left-sided primary testicular large B-cell lymphoma.- currently in remission S/p ipsilateral orchiectomy S/p 6 cycles of R-CEOP and 3 dose of IT MTX (for CNS prophylaxis) and R Tto contralateral testis. BCL-2, BCL 6, c-Myc negative.  2) history of acute myocardial infarction status post CABG x4 (06/26/2020) 3) nonischemic and ischemic cardiomyopathy ejection fraction 25 to 30% on last echo. 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 CBC normal, showed WBC  of 9.7K, hemoglobin improved to 12.2, and platelets of 224K. -CMP stable -LDH wnl at 138 -Will follow-up with patients on labs that are pending -Patient has no clinical or lab evidence of recurrence/progression of his lymphoma. -No indication for additional treatment of the patient's lymphoma at this time. -Continue vitamin D at least 2000 units daily  -Educated the patient on heart function and its correlation with how the patient is feeling -Follow up in 4 months -once patient hits the two year mark of having been in remission, will extend visits to every 6 months -advised patient to follow with dermatology sooner than his scheduled appointment in November/December if his wounds are changing -continue to follow up with dermatology and urology for optimal care  FOLLOW-UP: RTC with Dr Candise Che with labs in 4 months  The total time spent in the appointment was 25 minutes* .  All of the patient's questions were answered with apparent satisfaction. The patient knows to call the clinic with any problems, questions or concerns.   Wyvonnia Lora MD MS AAHIVMS [facility] Hematology/Oncology Physician [facility]  .*Total Encounter Time as defined by the Centers for Medicare and Medicaid Services includes, in addition to the face-to-face time of a patient visit (documented in the note above) non-face-to-face time: obtaining and reviewing outside history, ordering and reviewing medications, tests or procedures, care coordination (communications with other health care professionals or caregivers) and documentation in the medical record.   Alben Deeds Teague,acting as a Neurosurgeon for Wyvonnia Lora, MD.,have documented all relevant documentation on the behalf of Wyvonnia Lora, MD,as directed by  Wyvonnia Lora, MD while in the presence of Wyvonnia Lora, MD.  .I have reviewed the above documentation for accuracy and completeness, and I agree with the above. Johney Maine MD

## 2022-07-11 ENCOUNTER — Telehealth: Payer: Self-pay | Admitting: Hematology

## 2022-07-15 ENCOUNTER — Encounter: Payer: Self-pay | Admitting: Hematology

## 2022-08-02 ENCOUNTER — Telehealth: Payer: Self-pay | Admitting: Cardiovascular Disease

## 2022-08-02 NOTE — Telephone Encounter (Signed)
Patient is calling because he would like Dr. Flora Lipps to explain to him at his next appt if his appendage was removed during his open heart surgery in 2022. Please advise.

## 2022-08-02 NOTE — Telephone Encounter (Signed)
Patient states received prescription states to make a visit. He has appt. August. Long story he ask regarding the watchman and wants to know if the "appendage" that would be needed for the watchman to be attached, has been removed during his open heart surgery.  He wants to discuss further at appt. If an option but wants doctor to see if this "appendage" was removed.

## 2022-08-17 ENCOUNTER — Telehealth: Payer: Self-pay

## 2022-08-17 NOTE — Telephone Encounter (Signed)
[employer] alert received for questionable AFL w/ RVR VS. Tachycardia. Noted FF present prior to tachy event. No OAC on MAR so routing to Dr. Ladona Ridgel to see what his thoughts are.

## 2022-08-24 NOTE — Telephone Encounter (Signed)
Looks like AT to me. No additional treatment.

## 2022-08-25 ENCOUNTER — Other Ambulatory Visit: Payer: Self-pay | Admitting: Internal Medicine

## 2022-09-01 ENCOUNTER — Ambulatory Visit (INDEPENDENT_AMBULATORY_CARE_PROVIDER_SITE_OTHER): Payer: Medicare HMO

## 2022-09-01 DIAGNOSIS — I428 Other cardiomyopathies: Secondary | ICD-10-CM | POA: Diagnosis not present

## 2022-09-01 DIAGNOSIS — I5020 Unspecified systolic (congestive) heart failure: Secondary | ICD-10-CM

## 2022-09-01 NOTE — Progress Notes (Signed)
Cardiology Office Note:   Date:  09/02/2022  NAME:  Roy [name]    MRN: [id number] DOB:  [birth year]   PCP:  Irena Reichmann, DO  Cardiologist:  Reatha Harps, MD  Electrophysiologist:  None   Referring MD: Irena Reichmann, DO   Chief Complaint  Patient presents with   Follow-up         History of Present Illness:   Roy SCHRECKENGOST is a 77 y.o. male with a hx of CAD s/p CABG, CHF, DM, HLD who presents for follow-up.  He reports for the past few weeks she has had cough and congestion.  His weights continue to go down.  Weight is 203 pounds.  No signs of volume overload.  Still taking torsemide 3 days/week.  We discussed backing off on this.  Most recent kidney profile was normal and within limits.  I think he is a bit dehydrated.  He gets dizzy when he stands.  He has to wait a little bit before walking.  No passing out.  No syncope.  He reports no chest pains.  Just cough.  No significant shortness of breath.  He did test negative for COVID but I am bit concerned.  He does have some crackles at the right lung base.  Does not appear to be volume on my examination.  He is due for an updated echo.  His lipids are also due.  Reports no symptoms of angina.  His cancer seems to be controlled.  EF on CMR last year of 35%.   Problem List 1. 3vCAD -CABG x 4 03/25/2020 (LIMA-LAD, RIMA-PDA, radial artery OM1/2) 2. Systolic HF, ischemic -02/2020: 25-30% -4//2022: 20-25% -10/2020: 20-25% -03/2021: EF 35% CMR 3. DM -A1c 6.7 4. HLD -T chol 140, HDL 41, LDL 65, TG 195 5. CKD III 6. 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  Past Medical History: Past Medical History:  Diagnosis Date   Allergic rhinitis    Arthritis    wrists   Cardiomyopathy, nonischemic (HCC)    followed by cardiology--- dr Delton See---  2016 ef 45-50% ,  2016 nuclear ef 37%,  2017 per echo ef 50-55%   CHF (congestive heart failure), NYHA class III (HCC) 03/21/2020   Coronary artery disease    GERD  (gastroesophageal reflux disease)    Hiatal hernia    History of kidney stones    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;   01/ 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   History of syncope (03-06-2020 pt stated has not had sycopal episode in few years, stated it seems to happen in extreme hot conditions)   cardiologist--- dr Eloy End--- dx recurrent syncope;  nuclear study 11-03-2014 intermediate risk w/ no ischemia, apical hypokinesis, nuclear ef 37%;  event monitor-- 12-21-2015 SB/ ST  no pauses/ arrythmia's;  echo 08-03-2015 ef 50-55%   Hyperlipidemia    Hypertension    followed by pcp   Hypovitaminosis D    Mass of left testicle    Nocturia    Plantar fasciitis    Presence of surgical incision    01/ 2022  moh's w/ skin graft of nose, per pt still healing and wear bandage daily   Type 2 diabetes mellitus (HCC)    pt is adament that he is not and have been told he is a diabetic but a borderline;  followed by pcp, in pcp note states DM2 and takes 2 meds  daily   Wears glasses    Wears hearing aid in both ears    Weight loss 11/16/2020    Past Surgical History: Past Surgical History:  Procedure Laterality Date   COLONOSCOPY  last one 01-30-2017   CORONARY ARTERY BYPASS GRAFT N/A 03/25/2020   Procedure: CORONARY ARTERY BYPASS GRAFTING (CABG), ON PUMP, TIMES FOUR, USING BILATERAL INTERNAL MAMMARY ARTERIES AND LEFT RADIAL ARTERY;  Surgeon: Linden Dolin, MD;  Location: [facility] OR;  Service: Open Heart Surgery;  Laterality: N/A;   ICD IMPLANT N/A 08/23/2021   Procedure: ICD IMPLANT;  Surgeon: Marinus Maw, MD;  Location: [facility] INVASIVE CV LAB;  Service: Cardiovascular;  Laterality: N/A;   IR IMAGING GUIDED PORT INSERTION  07/06/2020   LOW ANTERIOR RESECTION RECTUM W/ COLOPROCTOSTOMY  05/2003   MOHS SURGERY  01/2020   nose w/ graft   ORCHIECTOMY Left 03/11/2020   Procedure: Clearance Coots;  Surgeon: Rene Paci, MD;  Location: [facility];  Service: Urology;  Laterality: Left;  ONLY NEEDS 60 MIN   PORTA CATH REMOVAL N/A 08/23/2021   Procedure: PORTA CATH REMOVAL;  Surgeon: Marinus Maw, MD;  Location: [facility] INVASIVE CV LAB;  Service: Cardiovascular;  Laterality: N/A;   RADIAL ARTERY HARVEST Left 03/25/2020   Procedure: LEFT RADIAL ARTERY HARVEST;  Surgeon: Linden Dolin, MD;  Location: [facility] OR;  Service: Open Heart Surgery;  Laterality: Left;   RIGHT/LEFT HEART CATH AND CORONARY ANGIOGRAPHY N/A 03/23/2020   Procedure: RIGHT/LEFT HEART CATH AND CORONARY ANGIOGRAPHY;  Surgeon: Corky Crafts, MD;  Location: [facility] INVASIVE CV LAB;  Service: Cardiovascular;  Laterality: N/A;   SHOULDER SURGERY Right 1992; 07/ 2021   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 Left 12/24/2008   left ear and nose    TEE WITHOUT CARDIOVERSION N/A 03/25/2020   Procedure: TRANSESOPHAGEAL ECHOCARDIOGRAM (TEE);  Surgeon: Linden Dolin, MD;  Location: [facility] OR;  Service: Open Heart Surgery;  Laterality: N/A;   TONSILLECTOMY AND ADENOIDECTOMY  child   UPPER GASTROINTESTINAL ENDOSCOPY  last one 06-06-2017    Current Medications: Current Meds  Medication Sig   ACCU-CHEK GUIDE test strip    Apoaequorin (PREVAGEN PO) Take 1 tablet by mouth at bedtime.   aspirin EC 81 MG tablet Take 81 mg by mouth daily. Swallow whole.   atorvastatin (LIPITOR) 40 MG tablet TAKE 1 TABLET BY MOUTH DAILY.   B-Complex TABS Take 1 tablet by mouth daily.   Blood Glucose Monitoring Suppl (ACCU-CHEK GUIDE ME) w/Device KIT    Cholecalciferol (VITAMIN D) 50 MCG (2000 UT) CAPS Take 2,000 Units by mouth daily.   ENTRESTO 24-26 MG TAKE 1 TABLET BY MOUTH 2 TIMES DAILY.   glucosamine-chondroitin 500-400 MG tablet Take 1 tablet by mouth See admin instructions. 5 times weekly   JARDIANCE 10 MG TABS tablet TAKE 1 TABLET BY MOUTH DAILY BEFORE BREAKFAST.   Lancets 33G MISC See admin instructions.   metFORMIN (GLUCOPHAGE) 1000 MG tablet Take 1,000 mg by mouth 2 (two)  times daily with a meal.    metoprolol (TOPROL-XL) 200 MG 24 hr tablet Take 1 tablet (200 mg total) by mouth daily.   Multiple Vitamin (MULTIVITAMIN WITH MINERALS) TABS tablet Take 1 tablet by mouth See admin instructions. 5 times weekly   omeprazole (PRILOSEC) 20 MG capsule Take 20 mg by mouth daily.   potassium chloride SA (KLOR-CON M) 20 MEQ tablet TAKE 1 TABLET (20 MEQ TOTAL) BY MOUTH DAILY.   spironolactone (ALDACTONE) 25 MG tablet  TAKE 1/2 TABLET BY MOUTH DAILY.   tamsulosin (FLOMAX) 0.4 MG CAPS capsule Take 0.4 mg by mouth at bedtime.   torsemide (DEMADEX) 20 MG tablet Take 1 tablet (20 mg total) by mouth 3 (three) times a week.   vitamin B-12 (CYANOCOBALAMIN) 1000 MCG tablet Take 1,000 mcg by mouth See admin instructions. 5 times a week     Allergies:    Patient has no known allergies.   Social History: Social History   Socioeconomic History   Marital status: Married    Spouse name: Not on file   Number of children: Not on file   Years of education: Not on file   Highest education level: Not on file  Occupational History   Not on file  Tobacco Use   Smoking status: Former    Types: Pipe    Quit date: 11/29/1976    Years since quitting: 45.7   Smokeless tobacco: Never  Vaping Use   Vaping status: Never Used  Substance and Sexual Activity   Alcohol use: Yes    Alcohol/week: 0.0 standard drinks of alcohol    Comment: Occasional drink    Drug use: Never   Sexual activity: Not on file  Other Topics Concern   Not on file  Social History Narrative   Not on file   Social Determinants of Health   Financial Resource Strain: Not on file  Food Insecurity: Not on file  Transportation Needs: Not on file  Physical Activity: Not on file  Stress: Not on file  Social Connections: Not on file     Family History: The patient's family history includes Heart attack in his mother; Stroke in his father. There is no history of Colon cancer, Esophageal cancer, Pancreatic cancer,  Rectal cancer, Stomach cancer, or Colon polyps.  ROS:   All other ROS reviewed and negative. Pertinent positives noted in the HPI.     EKGs/Labs/Other Studies Reviewed:   The following studies were personally reviewed by me today:  EKG:  EKG is not ordered today.        Recent Labs: 12/22/2021: B Natriuretic Peptide 235.1 07/08/2022: ALT 14; BUN 28; Creatinine 2.02; Hemoglobin 12.2; Platelet Count 224; Potassium 3.7; Sodium 139   Recent Lipid Panel    Component Value Date/Time   CHOL 128 07/03/2020 1028   TRIG 124 07/03/2020 1028   HDL 41 07/03/2020 1028   CHOLHDL 3.1 07/03/2020 1028   CHOLHDL 3.9 03/21/2020 1147   VLDL 30 03/21/2020 1147   LDLCALC 65 07/03/2020 1028    Physical Exam:   VS:  BP 106/74 (BP Location: Left Arm, Patient Position: Sitting, Cuff Size: Normal)   Pulse (!) 50   Ht 6' (1.829 m)   Wt 203 lb 12.8 oz (92.4 kg)   SpO2 100%   BMI 27.64 kg/m    Wt Readings from Last 3 Encounters:  09/02/22 203 lb 12.8 oz (92.4 kg)  07/08/22 210 lb 8 oz (95.5 kg)  03/01/22 212 lb 3.2 oz (96.3 kg)    General: Well nourished, well developed, in no acute distress Head: Atraumatic, normal size  Eyes: PEERLA, EOMI  Neck: Supple, no JVD Endocrine: No thryomegaly Cardiac: Normal S1, S2; RRR; no murmurs, rubs, or gallops Lungs: Crackles at the right lung base Abd: Soft, nontender, no hepatomegaly  Ext: No edema, pulses 2+ Musculoskeletal: No deformities, BUE and BLE strength normal and equal Skin: Warm and dry, no rashes   Neuro: Alert and oriented to person, place, time, and situation, CNII-XII  grossly intact, no focal deficits  Psych: Normal mood and affect   ASSESSMENT:   KENYUN LUMLEY is a 77 y.o. male who presents for the following: 1. Nonischemic cardiomyopathy (HCC)   2. Chronic systolic heart failure (HCC)   3. ICD (implantable cardioverter-defibrillator) in place   4. Coronary artery disease involving coronary bypass graft of native heart without angina  pectoris   5. Mixed hyperlipidemia   6. Essential hypertension   7. Subacute cough   8. Dizziness     PLAN:   1. Nonischemic cardiomyopathy (HCC) 2. Chronic systolic heart failure (HCC) 3. ICD (implantable cardioverter-defibrillator) in place -Nonischemic cardiomyopathy.  EF 35%.  He is a bit dizzy and lightheaded with standing.  I think he is taking too much diuretic.  Would recommend to take torsemide and potassium as needed if he notices lower extremity edema.  He also has cough and congestion.  Likely dehydrated in the setting of an acute upper respiratory infection. -We will repeat his echo. -Needs chest x-ray. -I would like to do a full panel of labs including CBC, BMP, BNP, TSH and lipids today.  He may need antibiotics.  We will see what his chest x-ray shows.  4. Coronary artery disease involving coronary bypass graft of native heart without angina pectoris 5. Mixed hyperlipidemia -Status post CABG.  No symptoms of angina.  Continue aspirin 81 mg daily.  He is on a beta-blocker.  6. Essential hypertension -Controlled.  7. Subacute cough -Chest ray and labs as above.  8. Dizziness -Likely led to dehydration.  We are reducing his torsemide to as needed.      Disposition: Return in about 4 months (around 01/02/2023).  Medication Adjustments/Labs and Tests Ordered: Current medicines are reviewed at length with the patient today.  Concerns regarding medicines are outlined above.  Orders Placed This Encounter  Procedures   DG Chest 2 View   Basic metabolic panel   CBC   Lipid panel   Pro b natriuretic peptide (BNP)   TSH   ECHOCARDIOGRAM COMPLETE   No orders of the defined types were placed in this encounter.  Patient Instructions  Medication Instructions:  Your physician recommends that you continue on your current medications as directed. Please refer to the Current Medication list given to you today.  Change  lasix (furosemide) and potassium only as needed for  swelling  *If you need a refill on your cardiac medications before your next appointment, please call your pharmacy*   Lab Work: BMET, CBC, BNP, Lipids, TSH  If you have labs (blood work) drawn today and your tests are completely normal, you will receive your results only by: MyChart Message (if you have MyChart) OR A paper copy in the mail If you have any lab test that is abnormal or we need to change your treatment, we will call you to review the results.   Testing/Procedures: Your physician has requested that you have an echocardiogram. Echocardiography is a painless test that uses sound waves to create images of your heart. It provides your doctor with information about the size and shape of your heart and how well your heart's chambers and valves are working. This procedure takes approximately one hour. There are no restrictions for this procedure. Please do NOT wear cologne, perfume, aftershave, or lotions (deodorant is allowed). Please arrive 15 minutes prior to your appointment time.    Chest xray   Follow-Up: At [facility], you and your health needs are our priority.  As  part of our continuing mission to provide you with exceptional heart care, we have created designated Provider Care Teams.  These Care Teams include your primary Cardiologist (physician) and Advanced Practice Providers (APPs -  Physician Assistants and Nurse Practitioners) who all work together to provide you with the care you need, when you need it.   Your next appointment:   4 month(s)  Provider:   Reatha Harps, MD        Time Spent with Patient: I have spent a total of 35 minutes with patient reviewing hospital notes, telemetry, EKGs, labs and examining the patient as well as establishing an assessment and plan that was discussed with the patient.  > 50% of time was spent in direct patient care.  Signed, Lenna Gilford. Flora Lipps, MD, [facility]  [facility]  [address]  [address] [place], [place] [address] [phone]  09/02/2022 9:31 AM

## 2022-09-02 ENCOUNTER — Ambulatory Visit
Admission: RE | Admit: 2022-09-02 | Discharge: 2022-09-02 | Disposition: A | Discharge status: Home / Self Care | Payer: Medicare HMO | Source: Ambulatory Visit | Attending: Cardiovascular Disease | Admitting: Cardiovascular Disease

## 2022-09-02 ENCOUNTER — Encounter: Payer: Self-pay | Admitting: Cardiovascular Disease

## 2022-09-02 ENCOUNTER — Ambulatory Visit: Payer: Medicare HMO | Attending: Cardiovascular Disease | Admitting: Cardiovascular Disease

## 2022-09-02 VITALS — BP 106/74 | HR 50 | Ht 72.0 in | Wt 203.8 lb

## 2022-09-02 DIAGNOSIS — I25810 Atherosclerosis of coronary artery bypass graft(s) without angina pectoris: Secondary | ICD-10-CM

## 2022-09-02 DIAGNOSIS — I5022 Chronic systolic (congestive) heart failure: Secondary | ICD-10-CM

## 2022-09-02 DIAGNOSIS — I428 Other cardiomyopathies: Secondary | ICD-10-CM

## 2022-09-02 DIAGNOSIS — R052 Subacute cough: Secondary | ICD-10-CM

## 2022-09-02 DIAGNOSIS — E782 Mixed hyperlipidemia: Secondary | ICD-10-CM

## 2022-09-02 DIAGNOSIS — I10 Essential (primary) hypertension: Secondary | ICD-10-CM

## 2022-09-02 DIAGNOSIS — Z95810 Presence of automatic (implantable) cardiac defibrillator: Secondary | ICD-10-CM

## 2022-09-02 DIAGNOSIS — R059 Cough, unspecified: Secondary | ICD-10-CM | POA: Diagnosis not present

## 2022-09-02 DIAGNOSIS — R42 Dizziness and giddiness: Secondary | ICD-10-CM

## 2022-09-02 LAB — CBC

## 2022-09-02 LAB — BASIC METABOLIC PANEL
BUN/Creatinine Ratio: 14 (ref 10–24)
BUN: 29 mg/dL — ABNORMAL HIGH (ref 8–27)
CO2: 15 mmol/L — ABNORMAL LOW (ref 20–29)
Calcium: 9.2 mg/dL (ref 8.6–10.2)
Chloride: 108 mmol/L — ABNORMAL HIGH (ref 96–106)
Creatinine, Ser: 2.13 mg/dL — ABNORMAL HIGH (ref 0.76–1.27)
Glucose: 160 mg/dL — ABNORMAL HIGH (ref 70–99)
Potassium: 4.4 mmol/L (ref 3.5–5.2)
Sodium: 139 mmol/L (ref 134–144)
eGFR: 31 mL/min/{1.73_m2} — ABNORMAL LOW (ref >59)

## 2022-09-02 LAB — LIPID PANEL
Chol/HDL Ratio: 3.6 ratio (ref 0.0–5.0)
Cholesterol, Total: 136 mg/dL (ref 100–199)
HDL: 38 mg/dL — ABNORMAL LOW (ref >39)
LDL Chol Calc (NIH): 59 mg/dL (ref 0–99)
Triglycerides: 245 mg/dL — ABNORMAL HIGH (ref 0–149)
VLDL Cholesterol Cal: 39 mg/dL (ref 5–40)

## 2022-09-02 LAB — TSH: TSH: 3.95 u[IU]/mL (ref 0.450–4.500)

## 2022-09-02 LAB — PRO B NATRIURETIC PEPTIDE

## 2022-09-02 NOTE — Patient Instructions (Signed)
Medication Instructions:  Your physician recommends that you continue on your current medications as directed. Please refer to the Current Medication list given to you today.  Change  lasix (furosemide) and potassium only as needed for swelling  *If you need a refill on your cardiac medications before your next appointment, please call your pharmacy*   Lab Work: BMET, CBC, BNP, Lipids, TSH  If you have labs (blood work) drawn today and your tests are completely normal, you will receive your results only by: MyChart Message (if you have MyChart) OR A paper copy in the mail If you have any lab test that is abnormal or we need to change your treatment, we will call you to review the results.   Testing/Procedures: Your physician has requested that you have an echocardiogram. Echocardiography is a painless test that uses sound waves to create images of your heart. It provides your doctor with information about the size and shape of your heart and how well your heart's chambers and valves are working. This procedure takes approximately one hour. There are no restrictions for this procedure. Please do NOT wear cologne, perfume, aftershave, or lotions (deodorant is allowed). Please arrive 15 minutes prior to your appointment time.    Chest xray   Follow-Up: At [facility], you and your health needs are our priority.  As part of our continuing mission to provide you with exceptional heart care, we have created designated Provider Care Teams.  These Care Teams include your primary Cardiologist (physician) and Advanced Practice Providers (APPs -  Physician Assistants and Nurse Practitioners) who all work together to provide you with the care you need, when you need it.   Your next appointment:   4 month(s)  Provider:   Reatha Harps, MD

## 2022-09-02 NOTE — Addendum Note (Signed)
Addended by: Corey Harold T on: 09/02/2022 09:46 AM   Modules accepted: Orders

## 2022-09-07 ENCOUNTER — Other Ambulatory Visit: Payer: Self-pay | Admitting: Orthopedic Surgery

## 2022-09-07 DIAGNOSIS — M25511 Pain in right shoulder: Secondary | ICD-10-CM | POA: Diagnosis not present

## 2022-09-07 DIAGNOSIS — S4291XA Fracture of right shoulder girdle, part unspecified, initial encounter for closed fracture: Secondary | ICD-10-CM

## 2022-09-09 ENCOUNTER — Ambulatory Visit
Admission: RE | Admit: 2022-09-09 | Discharge: 2022-09-09 | Disposition: A | Discharge status: Home / Self Care | Payer: Medicare HMO | Source: Ambulatory Visit | Attending: Orthopedic Surgery | Admitting: Orthopedic Surgery

## 2022-09-09 DIAGNOSIS — S4291XA Fracture of right shoulder girdle, part unspecified, initial encounter for closed fracture: Secondary | ICD-10-CM

## 2022-09-09 DIAGNOSIS — M25511 Pain in right shoulder: Secondary | ICD-10-CM | POA: Diagnosis not present

## 2022-09-19 DIAGNOSIS — S42294A Other nondisplaced fracture of upper end of right humerus, initial encounter for closed fracture: Secondary | ICD-10-CM | POA: Diagnosis not present

## 2022-09-29 ENCOUNTER — Ambulatory Visit: Payer: Medicare HMO | Attending: Cardiovascular Disease

## 2022-09-29 DIAGNOSIS — I428 Other cardiomyopathies: Secondary | ICD-10-CM

## 2022-09-29 DIAGNOSIS — I5022 Chronic systolic (congestive) heart failure: Secondary | ICD-10-CM

## 2022-09-29 DIAGNOSIS — R052 Subacute cough: Secondary | ICD-10-CM

## 2022-09-29 DIAGNOSIS — I10 Essential (primary) hypertension: Secondary | ICD-10-CM

## 2022-09-29 DIAGNOSIS — E782 Mixed hyperlipidemia: Secondary | ICD-10-CM | POA: Diagnosis not present

## 2022-09-29 DIAGNOSIS — Z95810 Presence of automatic (implantable) cardiac defibrillator: Secondary | ICD-10-CM | POA: Diagnosis not present

## 2022-09-29 DIAGNOSIS — R42 Dizziness and giddiness: Secondary | ICD-10-CM | POA: Diagnosis not present

## 2022-09-29 DIAGNOSIS — I25810 Atherosclerosis of coronary artery bypass graft(s) without angina pectoris: Secondary | ICD-10-CM

## 2022-09-29 MED ORDER — PERFLUTREN LIPID MICROSPHERE
1.0000 mL | INTRAVENOUS | Status: AC | PRN
  Administered 2022-09-29: 2 mL via INTRAVENOUS

## 2022-09-30 LAB — ECHOCARDIOGRAM COMPLETE
Area-P 1/2: 3.65 cm2
S' Lateral: 4.4 cm

## 2022-10-03 ENCOUNTER — Telehealth: Payer: Self-pay | Admitting: *Deleted

## 2022-10-03 ENCOUNTER — Other Ambulatory Visit: Payer: Self-pay | Admitting: Cardiovascular Disease

## 2022-10-03 NOTE — Telephone Encounter (Signed)
The patient has been notified of the result and verbalized understanding.  All questions (if any) were answered. Patient wanted Dr Val EagleJennette Kettle  that his medication [place] Pepper has gone up to $146 month from the first of the year. Rn will defer to Dr Marylene Land, Verna Czech, RN 10/03/2022 5:34 PM   Will have prior auth team  to see if there is a reason for the increase?  Also mailed patient a patient assistance form to fill out and return

## 2022-10-03 NOTE — Telephone Encounter (Signed)
  Your ultrasound is stable.  Your heart function is reduced but this has been changed.  There is no extra fluid or increased pressure in your heart.  I would recommend to continue your medications as prescribed.  Please let me know if you have any questions or concerns.   Best,   Gerri Spore T. Flora Lipps, MD, [facility]  [facility] [address] [place], [place] [address] [phone] 6:17 PM

## 2022-10-03 NOTE — Telephone Encounter (Signed)
-----   Message from Reatha Harps sent at 10/02/2022  6:17 PM EDT ----- Echo is stable.  MyChart message.  Gerri Spore T. Flora Lipps, MD, [facility]  [facility]  [address], [address] [place], [place] [address] [phone]  6:16 PM

## 2022-10-04 ENCOUNTER — Telehealth: Payer: Self-pay | Admitting: Pharmacy Technician

## 2022-10-04 ENCOUNTER — Other Ambulatory Visit (HOSPITAL_COMMUNITY): Payer: Self-pay

## 2022-10-04 NOTE — Telephone Encounter (Signed)
Pharmacy Patient Advocate Encounter  Insurance verification completed.    The patient is insured through [employer] claim for [employer] Currently a quantity of 30 is a 30 day supply and the co-pay is $136.90 .   This test claim was processed through [facility]- copay amounts may vary at other pharmacies due to pharmacy/plan contracts, or as the patient moves through the different stages of their insurance plan.

## 2022-10-04 NOTE — Telephone Encounter (Signed)
Ran test claim- Patient is in coverage GAP and that appears to be why the amount is so expensive

## 2022-10-06 NOTE — Telephone Encounter (Signed)
Per   message below   Pharmacy Patient Advocate Encounter   Insurance verification completed.     The patient is insured through [employer] claim for [employer] Currently a quantity of 30 is a 30 day supply and the co-pay is $136.90 .     This test claim was processed through [facility]- copay amounts may vary at other pharmacies due to pharmacy/plan contracts, or as the patient moves through the different stages of their insurance plan.   Crystal Colgate CPHT

## 2022-10-17 DIAGNOSIS — S42294D Other nondisplaced fracture of upper end of right humerus, subsequent encounter for fracture with routine healing: Secondary | ICD-10-CM | POA: Diagnosis not present

## 2022-10-18 NOTE — Progress Notes (Signed)
Remote ICD transmission.   

## 2022-11-01 DIAGNOSIS — Z8572 Personal history of non-Hodgkin lymphomas: Secondary | ICD-10-CM | POA: Diagnosis not present

## 2022-11-01 DIAGNOSIS — R059 Cough, unspecified: Secondary | ICD-10-CM | POA: Diagnosis not present

## 2022-11-01 DIAGNOSIS — Z86008 Personal history of in-situ neoplasm of other site: Secondary | ICD-10-CM | POA: Diagnosis not present

## 2022-11-05 ENCOUNTER — Other Ambulatory Visit: Payer: Self-pay

## 2022-11-05 DIAGNOSIS — C8330 Diffuse large B-cell lymphoma, unspecified site: Secondary | ICD-10-CM

## 2022-11-07 ENCOUNTER — Inpatient Hospital Stay: Payer: Medicare HMO | Attending: Hematology

## 2022-11-07 ENCOUNTER — Encounter: Payer: Self-pay | Admitting: Hematology

## 2022-11-07 ENCOUNTER — Inpatient Hospital Stay: Payer: Medicare HMO | Admitting: Hematology

## 2022-11-07 VITALS — BP 134/73 | HR 83 | Temp 97.9°F | Resp 16 | Wt 207.1 lb

## 2022-11-07 DIAGNOSIS — E785 Hyperlipidemia, unspecified: Secondary | ICD-10-CM | POA: Insufficient documentation

## 2022-11-07 DIAGNOSIS — I10 Essential (primary) hypertension: Secondary | ICD-10-CM | POA: Insufficient documentation

## 2022-11-07 DIAGNOSIS — C8330 Diffuse large B-cell lymphoma, unspecified site: Secondary | ICD-10-CM | POA: Diagnosis not present

## 2022-11-07 DIAGNOSIS — Z9079 Acquired absence of other genital organ(s): Secondary | ICD-10-CM | POA: Diagnosis not present

## 2022-11-07 DIAGNOSIS — Z808 Family history of malignant neoplasm of other organs or systems: Secondary | ICD-10-CM | POA: Insufficient documentation

## 2022-11-07 DIAGNOSIS — C83398 Diffuse large b-cell lymphoma of other extranodal and solid organ sites: Secondary | ICD-10-CM | POA: Insufficient documentation

## 2022-11-07 DIAGNOSIS — E119 Type 2 diabetes mellitus without complications: Secondary | ICD-10-CM | POA: Insufficient documentation

## 2022-11-07 DIAGNOSIS — K219 Gastro-esophageal reflux disease without esophagitis: Secondary | ICD-10-CM | POA: Insufficient documentation

## 2022-11-07 DIAGNOSIS — I252 Old myocardial infarction: Secondary | ICD-10-CM | POA: Insufficient documentation

## 2022-11-07 DIAGNOSIS — I255 Ischemic cardiomyopathy: Secondary | ICD-10-CM | POA: Diagnosis not present

## 2022-11-07 DIAGNOSIS — D72829 Elevated white blood cell count, unspecified: Secondary | ICD-10-CM | POA: Diagnosis not present

## 2022-11-07 LAB — CMP (CANCER CENTER ONLY)
ALT: 15 U/L (ref 0–44)
AST: 14 U/L — ABNORMAL LOW (ref 15–41)
Albumin: 4.1 g/dL (ref 3.5–5.0)
Alkaline Phosphatase: 51 U/L (ref 38–126)
Anion gap: 8 (ref 5–15)
BUN: 23 mg/dL (ref 8–23)
CO2: 21 mmol/L — ABNORMAL LOW (ref 22–32)
Calcium: 8.6 mg/dL — ABNORMAL LOW (ref 8.9–10.3)
Chloride: 114 mmol/L — ABNORMAL HIGH (ref 98–111)
Creatinine: 1.44 mg/dL — ABNORMAL HIGH (ref 0.61–1.24)
GFR, Estimated: 49 mL/min — ABNORMAL LOW (ref >60)
Glucose, Bld: 122 mg/dL — ABNORMAL HIGH (ref 70–99)
Potassium: 3.7 mmol/L (ref 3.5–5.1)
Sodium: 143 mmol/L (ref 135–145)
Total Bilirubin: 0.8 mg/dL (ref 0.3–1.2)
Total Protein: 6.5 g/dL (ref 6.5–8.1)

## 2022-11-07 LAB — CBC WITH DIFFERENTIAL (CANCER CENTER ONLY)
Abs Immature Granulocytes: 0.04 10*3/uL (ref 0.00–0.07)
Basophils Absolute: 0 10*3/uL (ref 0.0–0.1)
Basophils Relative: 0 %
Eosinophils Absolute: 0.1 10*3/uL (ref 0.0–0.5)
Eosinophils Relative: 1 %
HCT: 32.9 % — ABNORMAL LOW (ref 39.0–52.0)
Hemoglobin: 10.7 g/dL — ABNORMAL LOW (ref 13.0–17.0)
Immature Granulocytes: 0 %
Lymphocytes Relative: 12 %
Lymphs Abs: 1.3 10*3/uL (ref 0.7–4.0)
MCH: 30.9 pg (ref 26.0–34.0)
MCHC: 32.5 g/dL (ref 30.0–36.0)
MCV: 95.1 fL (ref 80.0–100.0)
Monocytes Absolute: 0.8 10*3/uL (ref 0.1–1.0)
Monocytes Relative: 7 %
Neutro Abs: 8.9 10*3/uL — ABNORMAL HIGH (ref 1.7–7.7)
Neutrophils Relative %: 80 %
Platelet Count: 246 10*3/uL (ref 150–400)
RBC: 3.46 MIL/uL — ABNORMAL LOW (ref 4.22–5.81)
RDW: 12.7 % (ref 11.5–15.5)
WBC Count: 11.1 10*3/uL — ABNORMAL HIGH (ref 4.0–10.5)
nRBC: 0 % (ref 0.0–0.2)

## 2022-11-07 LAB — LACTATE DEHYDROGENASE: LDH: 144 U/L (ref 98–192)

## 2022-11-07 NOTE — Progress Notes (Signed)
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  Patient was last seen by me on 07/08/2022 and he complained of one episode of one episode of phantom pain in the scrotum and neck pain when lying down.  Patient is accompanied by his wife during this visit. Patient notes that he has been having a cough for the past 3-4 months and he has been following up with his PCP. He had a chest X-Ray on 09/02/2022 which did not show any abnormalities. He is currently taking Benzonatate, Azithromycin, and Prednisone. He notes that his cough has slightly improved.   Patient notes he had a recent fall, which caused him to fracture his right shoulder. He notes his right shoulder has improved since the fall.   He denies any new infection issue, new lumps/bumps, fever, chills, night sweats, abdominal pain, chest pain, abnormal bowel movement, or leg swelling.  Patient is UTD for influenza vaccines, COVID-19 Booster, RSV vaccine, and other age related vaccines.    MEDICAL HISTORY:  Past Medical History:  Diagnosis Date   Allergic rhinitis    Arthritis    wrists   Cardiomyopathy, nonischemic [facility])    followed by cardiology--- dr Delton See---  2016 ef 45-50% ,  2016 nuclear ef 37%,  2017 per echo ef 50-55%   CHF (congestive heart failure), NYHA class III (HCC) 03/21/2020   Coronary artery disease    GERD (gastroesophageal reflux disease)    Hiatal hernia    History of kidney stones    History of squamous cell carcinoma excision     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;   01/ 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   History of syncope (03-06-2020 pt stated has not had sycopal episode in few years, stated it seems to happen in extreme hot conditions)   cardiologist--- dr Eloy End--- dx recurrent syncope;  nuclear study 11-03-2014 intermediate risk w/ no ischemia, apical hypokinesis, nuclear ef 37%;  event monitor-- 12-21-2015 SB/ ST  no pauses/ arrythmia's;  echo 08-03-2015 ef 50-55%   Hyperlipidemia    Hypertension    followed by pcp   Hypovitaminosis D    Mass of left testicle    Nocturia    Plantar fasciitis    Presence of surgical incision    01/ 2022  moh's w/ skin graft of nose, per pt still healing and wear bandage daily   Type 2 diabetes mellitus (HCC)    pt is adament that he is not and have been told he is a diabetic but a borderline;  followed by pcp, in pcp note states DM2 and takes 2 meds daily   Wears glasses    Wears hearing aid in both ears    Weight loss 11/16/2020    SURGICAL HISTORY: Past Surgical History:  Procedure Laterality Date   COLONOSCOPY  last one 01-30-2017   CORONARY ARTERY BYPASS GRAFT N/A 03/25/2020   Procedure: CORONARY ARTERY BYPASS GRAFTING (CABG), ON PUMP, TIMES FOUR, USING BILATERAL INTERNAL MAMMARY ARTERIES AND LEFT RADIAL ARTERY;  Surgeon: Linden Dolin, MD;  Location:  [facility] OR;  Service: Open Heart Surgery;  Laterality: N/A;   ICD IMPLANT N/A 08/23/2021   Procedure: ICD IMPLANT;  Surgeon: Marinus Maw, MD;  Location: [facility] INVASIVE CV LAB;  Service: Cardiovascular;  Laterality: N/A;   IR IMAGING GUIDED PORT INSERTION  07/06/2020   LOW ANTERIOR RESECTION RECTUM W/ COLOPROCTOSTOMY  05/2003   MOHS SURGERY  01/2020   nose w/ graft   ORCHIECTOMY Left 03/11/2020   Procedure: Clearance Coots;  Surgeon: Rene Paci, MD;  Location: [facility];  Service: Urology;  Laterality: Left;  ONLY NEEDS 60 MIN   PORTA CATH REMOVAL N/A 08/23/2021   Procedure: PORTA CATH  REMOVAL;  Surgeon: Marinus Maw, MD;  Location: [facility] INVASIVE CV LAB;  Service: Cardiovascular;  Laterality: N/A;   RADIAL ARTERY HARVEST Left 03/25/2020   Procedure: LEFT RADIAL ARTERY HARVEST;  Surgeon: Linden Dolin, MD;  Location: [facility] OR;  Service: Open Heart Surgery;  Laterality: Left;   RIGHT/LEFT HEART CATH AND CORONARY ANGIOGRAPHY N/A 03/23/2020   Procedure: RIGHT/LEFT HEART CATH AND CORONARY ANGIOGRAPHY;  Surgeon: Corky Crafts, MD;  Location: [facility] INVASIVE CV LAB;  Service: Cardiovascular;  Laterality: N/A;   SHOULDER SURGERY Right 1992; 07/ 2021   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 Left 12/24/2008   left ear and nose    TEE WITHOUT CARDIOVERSION N/A 03/25/2020   Procedure: TRANSESOPHAGEAL ECHOCARDIOGRAM (TEE);  Surgeon: Linden Dolin, MD;  Location: [facility] OR;  Service: Open Heart Surgery;  Laterality: N/A;   TONSILLECTOMY AND ADENOIDECTOMY  child   UPPER GASTROINTESTINAL ENDOSCOPY  last one 06-06-2017    SOCIAL HISTORY: Social History   Socioeconomic History   Marital status: Married    Spouse name: Not on file   Number of children: Not on file   Years of education: Not on file   Highest education level: Not on file  Occupational History   Not on file  Tobacco Use   Smoking status: Former    Types: Pipe    Quit date: 11/29/1976    Years since quitting: 45.9   Smokeless tobacco: Never  Vaping Use   Vaping status: Never Used  Substance and Sexual Activity   Alcohol use: Yes    Alcohol/week: 0.0 standard drinks of alcohol    Comment: Occasional drink    Drug use: Never   Sexual activity: Not on file  Other Topics Concern   Not on file  Social History Narrative   Not on file   Social Determinants of Health   Financial Resource Strain: Not on file  Food Insecurity: Not on file  Transportation Needs: Not on file  Physical Activity: Not on file  Stress: Not on file  Social Connections: Not on file  Intimate Partner Violence: Not on file     FAMILY HISTORY: Family History  Problem Relation Age of Onset   Heart attack Mother    Stroke Father    Colon cancer Neg Hx    Esophageal cancer Neg Hx    Pancreatic cancer Neg Hx    Rectal cancer Neg Hx    Stomach cancer Neg Hx    Colon polyps Neg Hx     ALLERGIES:  has No Known Allergies.  MEDICATIONS:  Current Outpatient Medications  Medication Sig Dispense Refill   ACCU-CHEK GUIDE test strip      Apoaequorin (PREVAGEN PO) Take 1 tablet by mouth at bedtime.     aspirin EC 81 MG tablet Take 81 mg by  mouth daily. Swallow whole.     atorvastatin (LIPITOR) 40 MG tablet TAKE 1 TABLET BY MOUTH DAILY. 90 tablet 3   B-Complex TABS Take 1 tablet by mouth daily.     Blood Glucose Monitoring Suppl (ACCU-CHEK GUIDE ME) w/Device KIT      Cholecalciferol (VITAMIN D) 50 MCG (2000 UT) CAPS Take 2,000 Units by mouth daily.     ENTRESTO 24-26 MG TAKE 1 TABLET BY MOUTH 2 TIMES DAILY. 180 tablet 3   glucosamine-chondroitin 500-400 MG tablet Take 1 tablet by mouth See admin instructions. 5 times weekly     JARDIANCE 10 MG TABS tablet TAKE 1 TABLET BY MOUTH DAILY BEFORE BREAKFAST. 30 tablet 3   Lancets 33G MISC See admin instructions.     metFORMIN (GLUCOPHAGE) 1000 MG tablet Take 1,000 mg by mouth 2 (two) times daily with a meal.      metoprolol (TOPROL-XL) 200 MG 24 hr tablet TAKE 1 TABLET (200 MG TOTAL) BY MOUTH DAILY. 90 tablet 1   Multiple Vitamin (MULTIVITAMIN WITH MINERALS) TABS tablet Take 1 tablet by mouth See admin instructions. 5 times weekly     omeprazole (PRILOSEC) 20 MG capsule Take 20 mg by mouth daily.     potassium chloride SA (KLOR-CON M) 20 MEQ tablet TAKE 1 TABLET (20 MEQ TOTAL) BY MOUTH DAILY. 90 tablet 3   spironolactone (ALDACTONE) 25 MG tablet TAKE 1/2 TABLET BY MOUTH DAILY. 45 tablet 3   tamsulosin (FLOMAX) 0.4 MG CAPS capsule Take 0.4 mg by mouth at bedtime.     torsemide (DEMADEX) 20 MG tablet Take 1 tablet (20 mg total) by mouth 3 (three) times a week. 60 tablet 2    vitamin B-12 (CYANOCOBALAMIN) 1000 MCG tablet Take 1,000 mcg by mouth See admin instructions. 5 times a week     No current facility-administered medications for this visit.    REVIEW OF SYSTEMS:   10 Point review of Systems was done is negative except as noted above.   PHYSICAL EXAMINATION: ECOG PERFORMANCE STATUS: 2 - Symptomatic, <50% confined to bed  .BP 134/73 (BP Location: Left Arm, Patient Position: Sitting)   Pulse 83   Temp 97.9 F (36.6 C) (Oral)   Resp 16   Wt 207 lb 1.6 oz (93.9 kg)   SpO2 100%   BMI 28.09 kg/m   GENERAL:alert, in no acute distress and comfortable SKIN: no acute rashes, no significant lesions EYES: conjunctiva are pink and non-injected, sclera anicteric OROPHARYNX: MMM, no exudates, no oropharyngeal erythema or ulceration NECK: supple, no JVD LYMPH:  no palpable lymphadenopathy in the cervical, axillary or inguinal regions LUNGS: clear to auscultation b/l with normal respiratory effort HEART: regular rate & rhythm ABDOMEN:  normoactive bowel sounds , non tender, not distended. Extremity: no pedal edema PSYCH: alert & oriented x 3 with fluent speech NEURO: no focal motor/sensory deficits  LABORATORY DATA:  I have reviewed the data as listed      Latest Ref Rng & Units 11/07/2022   12:38 PM 09/02/2022   10:00 AM 07/08/2022   10:09 AM  CBC  WBC 4.0 - 10.5 K/uL 11.1  10.0  9.7   Hemoglobin 13.0 - 17.0 g/dL 40.9  81.1  91.4   Hematocrit 39.0 - 52.0 % 32.9  36.9  37.6   Platelets 150 - 400 K/uL 246  267  224     .    Latest Ref Rng & Units 11/07/2022   12:38 PM 09/02/2022   10:00 AM 07/08/2022  10:09 AM  CMP  Glucose 70 - 99 mg/dL 161  096  045   BUN 8 - 23 mg/dL 23  29  28    Creatinine 0.61 - 1.24 mg/dL 4.09  8.11  9.14   Sodium 135 - 145 mmol/L 143  139  139   Potassium 3.5 - 5.1 mmol/L 3.7  4.4  3.7   Chloride 98 - 111 mmol/L 114  108  109   CO2 22 - 32 mmol/L 21  15  18    Calcium 8.9 - 10.3 mg/dL 8.6  9.2  9.0   Total Protein  6.5 - 8.1 g/dL 6.5   6.9   Total Bilirubin 0.3 - 1.2 mg/dL 0.8   0.8   Alkaline Phos 38 - 126 U/L 51   68   AST 15 - 41 U/L 14   15   ALT 0 - 44 U/L 15   14    . Lab Results  Component Value Date   LDH 144 11/07/2022   SURGICAL PATHOLOGY   THIS IS AN ADDENDUM REPORT   CASE: [id number]  PATIENT: Naod Metter  Surgical Pathology Report  Addendum    Reason for Addendum #1:  Molecular Genetic Test Results, FISH   Clinical History: Left testicular mass (crm)      FINAL MICROSCOPIC DIAGNOSIS:   A. TESTICLE, LEFT, ORCHIECTOMY:  -  Diffuse aggressive large B-cell lymphoma  -  See comment    COMMENT:   Sections of testicle show architectural effacement by sheets of large  lymphoid cells with vesicular nuclei and pale cytoplasm.  There is  admixed apoptotic debris and increased mitotic activity.  By  immunohistochemistry, the large lymphoid cells are positive for CD20,  CD5 (dim), BCL6 (dim), MUM1, and BCL2.  They are negative for CD10, CD30  (<1%), cyclin D1, and TdT.  The Ki67 proliferation index is up to  approximately 70%.  CD3 highlights small T-cells in the background. EBV  is negative by in situ hybridization.   Together, the findings support the diagnosis of a diffuse aggressive  large B-cell lymphoma. The differential diagnosis includes a diffuse  large B-cell lymphoma, NOS, with activated B-cell subtype by the [facility]  algorithm and high-grade B-cell lymphoma with MYC and BCL2 or BCL6  rearrangements.  FISH for BCL2, BCL6, and MYC rearrangements will be  performed. Correlation with clinical and radiographic findings is  recommended for consideration of a primary testicular lymphoma.   Result reported to L. Gibson on 03/16/20 at 1720 by S. O'Neill.    ADDENDUM:   FISH RESULTS:   Results: NORMAL   Interpretation:   BCL6 rearrangement:      Not Detected  MYC rearrangement:  Not Detected  MYC amplification:  Not Detected  BCL6 rearrangement:      Not  Detected   Please note this testing was performed and interpreted by an outside  facility ([facility]).  This addendum is only being added to provide a  summary of the results for report completeness.  Please see electronic  medical record for a copy of the full report.   RADIOGRAPHIC STUDIES: I have personally reviewed the radiological images as listed and agreed with the findings in the report. No results found.  ASSESSMENT & PLAN:   77 year old wonderful gentleman with history of hypertension, borderline diabetes, dyslipidemia, GERD with recent STEMI on 03/21/2020 and status post four-vessel CABG on 03/25/2020.  1) Left-sided primary testicular large B-cell lymphoma.- currently in remission S/p ipsilateral orchiectomy S/p 6 cycles of  R-CEOP and 3 dose of IT MTX (for CNS prophylaxis) and R Tto contralateral testis. BCL-2, BCL 6, c-Myc negative.  2) history of acute myocardial infarction status post CABG x4 (06/26/2020) 3) nonischemic and ischemic cardiomyopathy ejection fraction 25 to 30% on last echo. 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 -No indication for additional treatment of the patient's lymphoma at this time. -Discussed lab results from today, 11/07/2022, in detail with the patient. CBC shows slightly elevated WBC at 11.1 K, mild anemia with hemoglobin of 10.7 g/dL and hematocrit of 40.9%.  CMP stable -Discussed with the patient that Sherryll Burger can cause cough. Recommend to get in touch with Cardiologist.  -Recommend to use walking stick to avoid falls.  -Answered all of patient's questions.  -Discussed with the patient that since he is 2 years in remission, we will now do visits every 35-months.   FOLLOW-UP: RTC with Dr Candise Che with labs in 5  months  The total time spent in the appointment was 30 minutes* .  All of the patient's questions were answered with apparent satisfaction. The patient knows to call the clinic with any problems, questions or concerns.   Wyvonnia Lora MD MS AAHIVMS [facility] Hematology/Oncology Physician [facility]  .*Total Encounter Time as defined by the Centers for Medicare and Medicaid Services includes, in addition to the face-to-face time of a patient visit (documented in the note above) non-face-to-face time: obtaining and reviewing outside history, ordering and reviewing medications, tests or procedures, care coordination (communications with other health care professionals or caregivers) and documentation in the medical record.   I,Param Shah,acting as a Neurosurgeon for Wyvonnia Lora, MD.,have documented all relevant documentation on the behalf of Wyvonnia Lora, MD,as directed by  Wyvonnia Lora, MD while in the presence of Wyvonnia Lora, MD.  .I have reviewed the above documentation for accuracy and completeness, and I agree with the above. Johney Maine MD

## 2022-11-11 ENCOUNTER — Telehealth: Payer: Self-pay | Admitting: Hematology

## 2022-11-11 ENCOUNTER — Telehealth: Payer: Self-pay | Admitting: Cardiovascular Disease

## 2022-11-11 MED ORDER — LOSARTAN POTASSIUM 25 MG PO TABS: 25.0000 mg | ORAL_TABLET | Freq: Every day | ORAL | 3 refills | Status: DC

## 2022-11-11 NOTE — Telephone Encounter (Signed)
Please advise 

## 2022-11-11 NOTE — Telephone Encounter (Signed)
Pt c/o medication issue:  1. Name of Medication: ENTRESTO 24-26 MG   2. How are you currently taking this medication (dosage and times per day)? As prescribed   3. Are you having a reaction (difficulty breathing--STAT)? Yes   4. What is your medication issue?  Patient is requesting call back to discuss this medication due to a cough he states he has had for over 3 months. He states that his cancer doctor told him to ask Dr. Flora Lipps if this medication could be the cause. Please advise.

## 2022-11-11 NOTE — Telephone Encounter (Signed)
Per Candise Che 10/14 los Left patient a message in regards to scheduled appointment times/dates; left callback number if needed for rescheduling

## 2022-11-11 NOTE — Telephone Encounter (Signed)
Spoke to patient and message relayed. Prescription sent to [facility] pharmacy. No further questions at this time.

## 2022-11-14 ENCOUNTER — Encounter: Payer: Self-pay | Admitting: Hematology

## 2022-11-14 NOTE — Telephone Encounter (Signed)
Patient was discontinue from [facility] to Losartan  10/2022

## 2022-11-22 DIAGNOSIS — I129 Hypertensive chronic kidney disease with stage 1 through stage 4 chronic kidney disease, or unspecified chronic kidney disease: Secondary | ICD-10-CM | POA: Diagnosis not present

## 2022-11-22 DIAGNOSIS — E119 Type 2 diabetes mellitus without complications: Secondary | ICD-10-CM | POA: Diagnosis not present

## 2022-11-22 DIAGNOSIS — K219 Gastro-esophageal reflux disease without esophagitis: Secondary | ICD-10-CM | POA: Diagnosis not present

## 2022-11-22 DIAGNOSIS — E1159 Type 2 diabetes mellitus with other circulatory complications: Secondary | ICD-10-CM | POA: Diagnosis not present

## 2022-11-22 DIAGNOSIS — N1832 Chronic kidney disease, stage 3b: Secondary | ICD-10-CM | POA: Diagnosis not present

## 2022-11-22 DIAGNOSIS — D631 Anemia in chronic kidney disease: Secondary | ICD-10-CM | POA: Diagnosis not present

## 2022-11-22 DIAGNOSIS — Z0100 Encounter for examination of eyes and vision without abnormal findings: Secondary | ICD-10-CM | POA: Diagnosis not present

## 2022-11-24 ENCOUNTER — Encounter: Payer: Self-pay | Admitting: Hematology

## 2022-11-28 DIAGNOSIS — C44622 Squamous cell carcinoma of skin of right upper limb, including shoulder: Secondary | ICD-10-CM | POA: Diagnosis not present

## 2022-11-28 DIAGNOSIS — L570 Actinic keratosis: Secondary | ICD-10-CM | POA: Diagnosis not present

## 2022-11-28 DIAGNOSIS — D225 Melanocytic nevi of trunk: Secondary | ICD-10-CM | POA: Diagnosis not present

## 2022-11-28 DIAGNOSIS — C44321 Squamous cell carcinoma of skin of nose: Secondary | ICD-10-CM | POA: Diagnosis not present

## 2022-11-28 DIAGNOSIS — C44519 Basal cell carcinoma of skin of other part of trunk: Secondary | ICD-10-CM | POA: Diagnosis not present

## 2022-11-28 DIAGNOSIS — L578 Other skin changes due to chronic exposure to nonionizing radiation: Secondary | ICD-10-CM | POA: Diagnosis not present

## 2022-11-28 DIAGNOSIS — L821 Other seborrheic keratosis: Secondary | ICD-10-CM | POA: Diagnosis not present

## 2022-11-28 DIAGNOSIS — D485 Neoplasm of uncertain behavior of skin: Secondary | ICD-10-CM | POA: Diagnosis not present

## 2022-11-28 DIAGNOSIS — C44629 Squamous cell carcinoma of skin of left upper limb, including shoulder: Secondary | ICD-10-CM | POA: Diagnosis not present

## 2022-11-28 DIAGNOSIS — D0471 Carcinoma in situ of skin of right lower limb, including hip: Secondary | ICD-10-CM | POA: Diagnosis not present

## 2022-11-28 DIAGNOSIS — D1801 Hemangioma of skin and subcutaneous tissue: Secondary | ICD-10-CM | POA: Diagnosis not present

## 2022-11-28 DIAGNOSIS — L853 Xerosis cutis: Secondary | ICD-10-CM | POA: Diagnosis not present

## 2022-12-01 ENCOUNTER — Telehealth: Payer: Self-pay

## 2022-12-01 ENCOUNTER — Telehealth: Payer: Self-pay | Admitting: Cardiovascular Disease

## 2022-12-01 ENCOUNTER — Ambulatory Visit (INDEPENDENT_AMBULATORY_CARE_PROVIDER_SITE_OTHER): Payer: Medicare HMO

## 2022-12-01 DIAGNOSIS — I428 Other cardiomyopathies: Secondary | ICD-10-CM | POA: Diagnosis not present

## 2022-12-01 LAB — CUP PACEART REMOTE DEVICE CHECK
Date Time Interrogation Session: 20241107094517
Implantable Lead Connection Status: 753985
Implantable Lead Implant Date: 20230731
Implantable Lead Location: 753860
Implantable Lead Model: 436909
Implantable Lead Serial Number: 81532339
Implantable Pulse Generator Implant Date: 20230731
Pulse Gen Model: 429525

## 2022-12-01 NOTE — Telephone Encounter (Signed)
Patient's wife is returning call. 

## 2022-12-01 NOTE — Telephone Encounter (Signed)
Patient wife states that in past 2 weeks he has been noticeably weaker and more fatigued with no interest in what's going on around him.   2 weeks ago he was taken off of his entresto and started on losartan due to cough.  Things seem to have gotten worse since then.

## 2022-12-01 NOTE — Telephone Encounter (Signed)
Pt c/o medication issue:  1. Name of Medication: losartan (COZAAR) 25 MG tablet   2. How are you currently taking this medication (dosage and times per day)? As directed  3. Are you having a reaction (difficulty breathing--STAT)? no  4. What is your medication issue? Patients wife called in, you can see the phone note from today 11/07. Patient is having PVC's and has fallen the last couple of weeks. She states that he has no energy and that him and the recliner have become friends. She notes that he had a med change within the last few weeks, he was taken off Entresto and started on Losartan. She just wants to make Dr. Flora Lipps aware of what is going on. Patient is to follow up with Dr. Ladona Ridgel.

## 2022-12-01 NOTE — Telephone Encounter (Signed)
Reviewed with A. Tillery, PA-C.   Patient needs follow up with general cardiology and PCP regarding symptoms.  PVC burden okay to address at next available appt with EP.  Nothing acute to do at present, would not make changes to medications currently.  Informed patient's wife, she is going to reach out to get him set up and is aware that we will be calling back to set up EP follow up.

## 2022-12-01 NOTE — Telephone Encounter (Signed)
Single chamber ICD with DX lead.  Presenting shows frequent PVC's. Noted uptick in PVC burden.  Past due for appt with Dr. Ladona Ridgel.  LM on patient's machine to call us back to assess for sx/changes due to increase in burden. Also, need to get him scheduled for follow up with Dr. Ladona Ridgel.

## 2022-12-01 NOTE — Telephone Encounter (Signed)
Spoke to patient and he stated he received a call about having PVC. He wasn't sure what to do. Advised him he has an appt with on 12/10 with the event clinic. Patient verbalized understanding and agree.

## 2022-12-05 ENCOUNTER — Emergency Department (HOSPITAL_COMMUNITY): Payer: Medicare HMO

## 2022-12-05 ENCOUNTER — Encounter (HOSPITAL_COMMUNITY): Payer: Self-pay | Admitting: Emergency Medicine

## 2022-12-05 ENCOUNTER — Telehealth: Payer: Self-pay

## 2022-12-05 ENCOUNTER — Inpatient Hospital Stay (HOSPITAL_COMMUNITY)
Admission: EM | Admit: 2022-12-05 | Discharge: 2022-12-27 | DRG: 840 | Disposition: A | Discharge status: Home Health | Payer: Medicare HMO | Attending: Internal Medicine | Admitting: Internal Medicine

## 2022-12-05 DIAGNOSIS — Z85828 Personal history of other malignant neoplasm of skin: Secondary | ICD-10-CM

## 2022-12-05 DIAGNOSIS — E1169 Type 2 diabetes mellitus with other specified complication: Secondary | ICD-10-CM | POA: Diagnosis present

## 2022-12-05 DIAGNOSIS — I6782 Cerebral ischemia: Secondary | ICD-10-CM | POA: Diagnosis not present

## 2022-12-05 DIAGNOSIS — D630 Anemia in neoplastic disease: Secondary | ICD-10-CM | POA: Diagnosis present

## 2022-12-05 DIAGNOSIS — E785 Hyperlipidemia, unspecified: Secondary | ICD-10-CM | POA: Diagnosis present

## 2022-12-05 DIAGNOSIS — R7989 Other specified abnormal findings of blood chemistry: Secondary | ICD-10-CM | POA: Diagnosis not present

## 2022-12-05 DIAGNOSIS — J9811 Atelectasis: Secondary | ICD-10-CM | POA: Diagnosis not present

## 2022-12-05 DIAGNOSIS — I130 Hypertensive heart and chronic kidney disease with heart failure and stage 1 through stage 4 chronic kidney disease, or unspecified chronic kidney disease: Secondary | ICD-10-CM | POA: Diagnosis present

## 2022-12-05 DIAGNOSIS — C8590 Non-Hodgkin lymphoma, unspecified, unspecified site: Secondary | ICD-10-CM | POA: Diagnosis not present

## 2022-12-05 DIAGNOSIS — I255 Ischemic cardiomyopathy: Secondary | ICD-10-CM | POA: Diagnosis present

## 2022-12-05 DIAGNOSIS — Z8249 Family history of ischemic heart disease and other diseases of the circulatory system: Secondary | ICD-10-CM

## 2022-12-05 DIAGNOSIS — E1122 Type 2 diabetes mellitus with diabetic chronic kidney disease: Secondary | ICD-10-CM | POA: Diagnosis present

## 2022-12-05 DIAGNOSIS — C7931 Secondary malignant neoplasm of brain: Secondary | ICD-10-CM | POA: Diagnosis not present

## 2022-12-05 DIAGNOSIS — E876 Hypokalemia: Secondary | ICD-10-CM | POA: Diagnosis present

## 2022-12-05 DIAGNOSIS — Z923 Personal history of irradiation: Secondary | ICD-10-CM

## 2022-12-05 DIAGNOSIS — Z794 Long term (current) use of insulin: Secondary | ICD-10-CM

## 2022-12-05 DIAGNOSIS — R296 Repeated falls: Secondary | ICD-10-CM | POA: Diagnosis present

## 2022-12-05 DIAGNOSIS — Z951 Presence of aortocoronary bypass graft: Secondary | ICD-10-CM

## 2022-12-05 DIAGNOSIS — H9190 Unspecified hearing loss, unspecified ear: Secondary | ICD-10-CM | POA: Diagnosis present

## 2022-12-05 DIAGNOSIS — R531 Weakness: Secondary | ICD-10-CM | POA: Diagnosis not present

## 2022-12-05 DIAGNOSIS — J90 Pleural effusion, not elsewhere classified: Secondary | ICD-10-CM | POA: Diagnosis not present

## 2022-12-05 DIAGNOSIS — Z823 Family history of stroke: Secondary | ICD-10-CM

## 2022-12-05 DIAGNOSIS — C8330 Diffuse large B-cell lymphoma, unspecified site: Secondary | ICD-10-CM | POA: Diagnosis not present

## 2022-12-05 DIAGNOSIS — Z7984 Long term (current) use of oral hypoglycemic drugs: Secondary | ICD-10-CM

## 2022-12-05 DIAGNOSIS — Z9079 Acquired absence of other genital organ(s): Secondary | ICD-10-CM | POA: Diagnosis not present

## 2022-12-05 DIAGNOSIS — I5021 Acute systolic (congestive) heart failure: Secondary | ICD-10-CM | POA: Diagnosis not present

## 2022-12-05 DIAGNOSIS — K219 Gastro-esophageal reflux disease without esophagitis: Secondary | ICD-10-CM | POA: Diagnosis present

## 2022-12-05 DIAGNOSIS — I2510 Atherosclerotic heart disease of native coronary artery without angina pectoris: Secondary | ICD-10-CM | POA: Diagnosis present

## 2022-12-05 DIAGNOSIS — C83398 Diffuse large b-cell lymphoma of other extranodal and solid organ sites: Principal | ICD-10-CM | POA: Diagnosis present

## 2022-12-05 DIAGNOSIS — G9389 Other specified disorders of brain: Secondary | ICD-10-CM | POA: Diagnosis not present

## 2022-12-05 DIAGNOSIS — M50321 Other cervical disc degeneration at C4-C5 level: Secondary | ICD-10-CM | POA: Diagnosis not present

## 2022-12-05 DIAGNOSIS — E1165 Type 2 diabetes mellitus with hyperglycemia: Secondary | ICD-10-CM | POA: Diagnosis present

## 2022-12-05 DIAGNOSIS — I493 Ventricular premature depolarization: Secondary | ICD-10-CM | POA: Diagnosis present

## 2022-12-05 DIAGNOSIS — M4802 Spinal stenosis, cervical region: Secondary | ICD-10-CM | POA: Diagnosis not present

## 2022-12-05 DIAGNOSIS — C8339 Primary central nervous system lymphoma: Secondary | ICD-10-CM

## 2022-12-05 DIAGNOSIS — Z9221 Personal history of antineoplastic chemotherapy: Secondary | ICD-10-CM

## 2022-12-05 DIAGNOSIS — N1832 Chronic kidney disease, stage 3b: Secondary | ICD-10-CM | POA: Diagnosis present

## 2022-12-05 DIAGNOSIS — I428 Other cardiomyopathies: Secondary | ICD-10-CM | POA: Diagnosis present

## 2022-12-05 DIAGNOSIS — I5022 Chronic systolic (congestive) heart failure: Secondary | ICD-10-CM | POA: Diagnosis present

## 2022-12-05 DIAGNOSIS — Z7982 Long term (current) use of aspirin: Secondary | ICD-10-CM

## 2022-12-05 DIAGNOSIS — T380X5A Adverse effect of glucocorticoids and synthetic analogues, initial encounter: Secondary | ICD-10-CM | POA: Diagnosis present

## 2022-12-05 DIAGNOSIS — K5730 Diverticulosis of large intestine without perforation or abscess without bleeding: Secondary | ICD-10-CM | POA: Diagnosis not present

## 2022-12-05 DIAGNOSIS — Z87891 Personal history of nicotine dependence: Secondary | ICD-10-CM

## 2022-12-05 DIAGNOSIS — C8599 Non-Hodgkin lymphoma, unspecified, extranodal and solid organ sites: Secondary | ICD-10-CM | POA: Diagnosis not present

## 2022-12-05 DIAGNOSIS — I509 Heart failure, unspecified: Secondary | ICD-10-CM

## 2022-12-05 DIAGNOSIS — R29818 Other symptoms and signs involving the nervous system: Secondary | ICD-10-CM | POA: Diagnosis not present

## 2022-12-05 DIAGNOSIS — N183 Chronic kidney disease, stage 3 unspecified: Secondary | ICD-10-CM | POA: Diagnosis present

## 2022-12-05 DIAGNOSIS — E872 Acidosis, unspecified: Secondary | ICD-10-CM | POA: Diagnosis present

## 2022-12-05 DIAGNOSIS — M48061 Spinal stenosis, lumbar region without neurogenic claudication: Secondary | ICD-10-CM | POA: Diagnosis not present

## 2022-12-05 DIAGNOSIS — I10 Essential (primary) hypertension: Secondary | ICD-10-CM | POA: Diagnosis present

## 2022-12-05 DIAGNOSIS — Z8547 Personal history of malignant neoplasm of testis: Secondary | ICD-10-CM

## 2022-12-05 DIAGNOSIS — G9341 Metabolic encephalopathy: Secondary | ICD-10-CM | POA: Diagnosis present

## 2022-12-05 DIAGNOSIS — E1159 Type 2 diabetes mellitus with other circulatory complications: Secondary | ICD-10-CM | POA: Diagnosis present

## 2022-12-05 DIAGNOSIS — R0602 Shortness of breath: Secondary | ICD-10-CM | POA: Diagnosis not present

## 2022-12-05 DIAGNOSIS — Z95810 Presence of automatic (implantable) cardiac defibrillator: Secondary | ICD-10-CM

## 2022-12-05 DIAGNOSIS — Z79899 Other long term (current) drug therapy: Secondary | ICD-10-CM

## 2022-12-05 DIAGNOSIS — I5042 Chronic combined systolic (congestive) and diastolic (congestive) heart failure: Secondary | ICD-10-CM | POA: Diagnosis not present

## 2022-12-05 DIAGNOSIS — Z950 Presence of cardiac pacemaker: Secondary | ICD-10-CM | POA: Diagnosis not present

## 2022-12-05 DIAGNOSIS — Z510 Encounter for antineoplastic radiation therapy: Secondary | ICD-10-CM | POA: Diagnosis not present

## 2022-12-05 DIAGNOSIS — G936 Cerebral edema: Secondary | ICD-10-CM | POA: Diagnosis not present

## 2022-12-05 DIAGNOSIS — I252 Old myocardial infarction: Secondary | ICD-10-CM

## 2022-12-05 DIAGNOSIS — M4804 Spinal stenosis, thoracic region: Secondary | ICD-10-CM | POA: Diagnosis not present

## 2022-12-05 DIAGNOSIS — I25810 Atherosclerosis of coronary artery bypass graft(s) without angina pectoris: Secondary | ICD-10-CM | POA: Diagnosis not present

## 2022-12-05 DIAGNOSIS — K8020 Calculus of gallbladder without cholecystitis without obstruction: Secondary | ICD-10-CM | POA: Diagnosis not present

## 2022-12-05 LAB — CBC WITH DIFFERENTIAL/PLATELET
Abs Immature Granulocytes: 0.06 10*3/uL (ref 0.00–0.07)
Basophils Absolute: 0.1 10*3/uL (ref 0.0–0.1)
Basophils Relative: 0 %
Eosinophils Absolute: 0.1 10*3/uL (ref 0.0–0.5)
Eosinophils Relative: 1 %
HCT: 33.1 % — ABNORMAL LOW (ref 39.0–52.0)
Hemoglobin: 10.3 g/dL — ABNORMAL LOW (ref 13.0–17.0)
Immature Granulocytes: 1 %
Lymphocytes Relative: 8 %
Lymphs Abs: 0.9 10*3/uL (ref 0.7–4.0)
MCH: 29.2 pg (ref 26.0–34.0)
MCHC: 31.1 g/dL (ref 30.0–36.0)
MCV: 93.8 fL (ref 80.0–100.0)
Monocytes Absolute: 0.7 10*3/uL (ref 0.1–1.0)
Monocytes Relative: 6 %
Neutro Abs: 10.1 10*3/uL — ABNORMAL HIGH (ref 1.7–7.7)
Neutrophils Relative %: 84 %
Platelets: 254 10*3/uL (ref 150–400)
RBC: 3.53 MIL/uL — ABNORMAL LOW (ref 4.22–5.81)
RDW: 12.6 % (ref 11.5–15.5)
WBC: 11.9 10*3/uL — ABNORMAL HIGH (ref 4.0–10.5)
nRBC: 0 % (ref 0.0–0.2)

## 2022-12-05 LAB — COMPREHENSIVE METABOLIC PANEL
ALT: 12 U/L (ref 0–44)
AST: 17 U/L (ref 15–41)
Albumin: 3.4 g/dL — ABNORMAL LOW (ref 3.5–5.0)
Alkaline Phosphatase: 96 U/L (ref 38–126)
Anion gap: 10 (ref 5–15)
BUN: 13 mg/dL (ref 8–23)
CO2: 18 mmol/L — ABNORMAL LOW (ref 22–32)
Calcium: 8.2 mg/dL — ABNORMAL LOW (ref 8.9–10.3)
Chloride: 113 mmol/L — ABNORMAL HIGH (ref 98–111)
Creatinine, Ser: 1.31 mg/dL — ABNORMAL HIGH (ref 0.61–1.24)
GFR, Estimated: 55 mL/min — ABNORMAL LOW (ref >60)
Glucose, Bld: 110 mg/dL — ABNORMAL HIGH (ref 70–99)
Potassium: 3.7 mmol/L (ref 3.5–5.1)
Sodium: 141 mmol/L (ref 135–145)
Total Bilirubin: 0.7 mg/dL (ref <1.2)
Total Protein: 6.3 g/dL — ABNORMAL LOW (ref 6.5–8.1)

## 2022-12-05 LAB — MAGNESIUM: Magnesium: 0.9 mg/dL — CL (ref 1.7–2.4)

## 2022-12-05 LAB — BRAIN NATRIURETIC PEPTIDE: B Natriuretic Peptide: 1431.6 pg/mL — ABNORMAL HIGH (ref 0.0–100.0)

## 2022-12-05 MED ORDER — MAGNESIUM SULFATE 2 GM/50ML IV SOLN
2.0000 g | Freq: Once | INTRAVENOUS | Status: AC
  Administered 2022-12-05: 2 g via INTRAVENOUS
  Filled 2022-12-05: qty 50

## 2022-12-05 MED ORDER — MAGNESIUM SULFATE 2 GM/50ML IV SOLN
2.0000 g | Freq: Once | INTRAVENOUS | Status: AC
  Administered 2022-12-06: 2 g via INTRAVENOUS
  Filled 2022-12-05: qty 50

## 2022-12-05 NOTE — Telephone Encounter (Signed)
Details for arrhythmia episode received (all types) Episode details were received for a spontaneous Slow nsT episode.  Patient has history of sx increase in PVC burden.  LM to assess patients symptoms with unknown duration of nsT event.  Has appt with Dr. Ladona Ridgel in December.

## 2022-12-05 NOTE — ED Triage Notes (Signed)
Pt. Presents to the ED BIBA from home for c/o increased generalized weakness since March.

## 2022-12-06 ENCOUNTER — Telehealth (HOSPITAL_COMMUNITY): Payer: Self-pay | Admitting: Cardiology

## 2022-12-06 ENCOUNTER — Other Ambulatory Visit: Payer: Self-pay

## 2022-12-06 DIAGNOSIS — E1159 Type 2 diabetes mellitus with other circulatory complications: Secondary | ICD-10-CM

## 2022-12-06 DIAGNOSIS — R531 Weakness: Secondary | ICD-10-CM | POA: Diagnosis not present

## 2022-12-06 DIAGNOSIS — N1832 Chronic kidney disease, stage 3b: Secondary | ICD-10-CM | POA: Diagnosis present

## 2022-12-06 DIAGNOSIS — I25810 Atherosclerosis of coronary artery bypass graft(s) without angina pectoris: Secondary | ICD-10-CM

## 2022-12-06 DIAGNOSIS — I428 Other cardiomyopathies: Secondary | ICD-10-CM

## 2022-12-06 DIAGNOSIS — I493 Ventricular premature depolarization: Secondary | ICD-10-CM | POA: Insufficient documentation

## 2022-12-06 DIAGNOSIS — C83398 Diffuse large b-cell lymphoma of other extranodal and solid organ sites: Secondary | ICD-10-CM

## 2022-12-06 DIAGNOSIS — R7989 Other specified abnormal findings of blood chemistry: Secondary | ICD-10-CM

## 2022-12-06 DIAGNOSIS — N183 Chronic kidney disease, stage 3 unspecified: Secondary | ICD-10-CM | POA: Insufficient documentation

## 2022-12-06 DIAGNOSIS — I5042 Chronic combined systolic (congestive) and diastolic (congestive) heart failure: Secondary | ICD-10-CM | POA: Diagnosis not present

## 2022-12-06 LAB — URINALYSIS, W/ REFLEX TO CULTURE (INFECTION SUSPECTED)
Bilirubin Urine: NEGATIVE
Glucose, UA: >=500 mg/dL — AB
Hgb urine dipstick: NEGATIVE
Ketones, ur: 5 mg/dL — AB
Leukocytes,Ua: NEGATIVE
Nitrite: NEGATIVE
Protein, ur: NEGATIVE mg/dL
Specific Gravity, Urine: 1.028 (ref 1.005–1.030)
pH: 5 (ref 5.0–8.0)

## 2022-12-06 LAB — BASIC METABOLIC PANEL
Anion gap: 11 (ref 5–15)
BUN: 12 mg/dL (ref 8–23)
CO2: 17 mmol/L — ABNORMAL LOW (ref 22–32)
Calcium: 8.1 mg/dL — ABNORMAL LOW (ref 8.9–10.3)
Chloride: 113 mmol/L — ABNORMAL HIGH (ref 98–111)
Creatinine, Ser: 1.41 mg/dL — ABNORMAL HIGH (ref 0.61–1.24)
GFR, Estimated: 51 mL/min — ABNORMAL LOW (ref >60)
Glucose, Bld: 102 mg/dL — ABNORMAL HIGH (ref 70–99)
Potassium: 2.9 mmol/L — ABNORMAL LOW (ref 3.5–5.1)
Sodium: 141 mmol/L (ref 135–145)

## 2022-12-06 LAB — CBC
HCT: 31.2 % — ABNORMAL LOW (ref 39.0–52.0)
Hemoglobin: 9.9 g/dL — ABNORMAL LOW (ref 13.0–17.0)
MCH: 29.6 pg (ref 26.0–34.0)
MCHC: 31.7 g/dL (ref 30.0–36.0)
MCV: 93.1 fL (ref 80.0–100.0)
Platelets: 246 10*3/uL (ref 150–400)
RBC: 3.35 MIL/uL — ABNORMAL LOW (ref 4.22–5.81)
RDW: 12.6 % (ref 11.5–15.5)
WBC: 9.3 10*3/uL (ref 4.0–10.5)
nRBC: 0 % (ref 0.0–0.2)

## 2022-12-06 LAB — MAGNESIUM: Magnesium: 2 mg/dL (ref 1.7–2.4)

## 2022-12-06 LAB — GLUCOSE, CAPILLARY
Glucose-Capillary: 149 mg/dL — ABNORMAL HIGH (ref 70–99)
Glucose-Capillary: 125 mg/dL — ABNORMAL HIGH (ref 70–99)

## 2022-12-06 LAB — HEMOGLOBIN A1C
Hgb A1c MFr Bld: 6.2 % — ABNORMAL HIGH (ref 4.8–5.6)
Mean Plasma Glucose: 131.24 mg/dL

## 2022-12-06 LAB — CBG MONITORING, ED
Glucose-Capillary: 95 mg/dL (ref 70–99)
Glucose-Capillary: 197 mg/dL — ABNORMAL HIGH (ref 70–99)

## 2022-12-06 LAB — MRSA NEXT GEN BY PCR, NASAL: MRSA by PCR Next Gen: NOT DETECTED

## 2022-12-06 MED ORDER — SPIRONOLACTONE 12.5 MG HALF TABLET
12.5000 mg | ORAL_TABLET | Freq: Every day | ORAL | Status: DC
  Administered 2022-12-06 – 2022-12-27 (×22): 12.5 mg via ORAL
  Filled 2022-12-06 (×22): qty 1

## 2022-12-06 MED ORDER — POTASSIUM CHLORIDE CRYS ER 20 MEQ PO TBCR: 20.0000 meq | EXTENDED_RELEASE_TABLET | ORAL | Status: DC

## 2022-12-06 MED ORDER — ONDANSETRON HCL 4 MG PO TABS: 4.0000 mg | ORAL_TABLET | Freq: Four times a day (QID) | ORAL | Status: DC | PRN

## 2022-12-06 MED ORDER — METOPROLOL SUCCINATE ER 50 MG PO TB24
200.0000 mg | ORAL_TABLET | Freq: Every day | ORAL | Status: DC
  Administered 2022-12-06 – 2022-12-22 (×16): 200 mg via ORAL
  Filled 2022-12-06 (×3): qty 4
  Filled 2022-12-06 (×2): qty 2
  Filled 2022-12-06 (×2): qty 4
  Filled 2022-12-06: qty 2
  Filled 2022-12-06 (×4): qty 4
  Filled 2022-12-06: qty 2
  Filled 2022-12-06 (×2): qty 4
  Filled 2022-12-06: qty 2
  Filled 2022-12-06 (×2): qty 4

## 2022-12-06 MED ORDER — ASPIRIN 81 MG PO TBEC
81.0000 mg | DELAYED_RELEASE_TABLET | Freq: Every day | ORAL | Status: DC
  Administered 2022-12-06 – 2022-12-27 (×22): 81 mg via ORAL
  Filled 2022-12-06 (×22): qty 1

## 2022-12-06 MED ORDER — TAMSULOSIN HCL 0.4 MG PO CAPS: 0.4000 mg | ORAL_CAPSULE | Freq: Every day | ORAL | Status: DC

## 2022-12-06 MED ORDER — POTASSIUM CHLORIDE CRYS ER 20 MEQ PO TBCR
40.0000 meq | EXTENDED_RELEASE_TABLET | Freq: Two times a day (BID) | ORAL | Status: AC
  Administered 2022-12-06: 40 meq via ORAL
  Filled 2022-12-06 (×2): qty 2

## 2022-12-06 MED ORDER — ACETAMINOPHEN 650 MG RE SUPP: 650.0000 mg | Freq: Four times a day (QID) | RECTAL | Status: DC | PRN

## 2022-12-06 MED ORDER — INSULIN ASPART 100 UNIT/ML IJ SOLN
0.0000 [IU] | Freq: Three times a day (TID) | INTRAMUSCULAR | Status: DC
  Administered 2022-12-06: 2 [IU] via SUBCUTANEOUS
  Administered 2022-12-06: 1 [IU] via SUBCUTANEOUS
  Administered 2022-12-07: 3 [IU] via SUBCUTANEOUS
  Administered 2022-12-07: 2 [IU] via SUBCUTANEOUS
  Administered 2022-12-07: 1 [IU] via SUBCUTANEOUS
  Administered 2022-12-08 – 2022-12-09 (×2): 2 [IU] via SUBCUTANEOUS
  Administered 2022-12-09: 1 [IU] via SUBCUTANEOUS
  Administered 2022-12-10 (×2): 2 [IU] via SUBCUTANEOUS
  Administered 2022-12-10: 1 [IU] via SUBCUTANEOUS
  Administered 2022-12-11: 3 [IU] via SUBCUTANEOUS
  Administered 2022-12-11: 2 [IU] via SUBCUTANEOUS
  Administered 2022-12-11: 5 [IU] via SUBCUTANEOUS
  Administered 2022-12-12: 2 [IU] via SUBCUTANEOUS
  Administered 2022-12-12: 5 [IU] via SUBCUTANEOUS

## 2022-12-06 MED ORDER — MAGNESIUM OXIDE -MG SUPPLEMENT 400 (240 MG) MG PO TABS
400.0000 mg | ORAL_TABLET | Freq: Two times a day (BID) | ORAL | Status: AC
  Administered 2022-12-06 (×2): 400 mg via ORAL
  Filled 2022-12-06 (×2): qty 1

## 2022-12-06 MED ORDER — ENSURE ENLIVE PO LIQD
237.0000 mL | Freq: Two times a day (BID) | ORAL | Status: DC
  Administered 2022-12-06 – 2022-12-27 (×33): 237 mL via ORAL
  Filled 2022-12-06: qty 237

## 2022-12-06 MED ORDER — TORSEMIDE 20 MG PO TABS: 20.0000 mg | ORAL_TABLET | ORAL | Status: DC

## 2022-12-06 MED ORDER — ENOXAPARIN SODIUM 40 MG/0.4ML IJ SOSY
40.0000 mg | PREFILLED_SYRINGE | INTRAMUSCULAR | Status: DC
  Administered 2022-12-06 – 2022-12-26 (×21): 40 mg via SUBCUTANEOUS
  Filled 2022-12-06 (×21): qty 0.4

## 2022-12-06 MED ORDER — LOSARTAN POTASSIUM 25 MG PO TABS
25.0000 mg | ORAL_TABLET | Freq: Every day | ORAL | Status: DC
  Administered 2022-12-06: 25 mg via ORAL
  Filled 2022-12-06: qty 1

## 2022-12-06 MED ORDER — INSULIN ASPART 100 UNIT/ML IJ SOLN
0.0000 [IU] | Freq: Every day | INTRAMUSCULAR | Status: DC
  Administered 2022-12-10 – 2022-12-11 (×2): 2 [IU] via SUBCUTANEOUS
  Administered 2022-12-12: 3 [IU] via SUBCUTANEOUS
  Administered 2022-12-13: 2 [IU] via SUBCUTANEOUS
  Administered 2022-12-16: 4 [IU] via SUBCUTANEOUS
  Administered 2022-12-17 – 2022-12-21 (×2): 2 [IU] via SUBCUTANEOUS

## 2022-12-06 MED ORDER — ACETAMINOPHEN 325 MG PO TABS: 650.0000 mg | ORAL_TABLET | Freq: Four times a day (QID) | ORAL | Status: DC | PRN

## 2022-12-06 MED ORDER — LOSARTAN POTASSIUM 50 MG PO TABS
50.0000 mg | ORAL_TABLET | Freq: Every day | ORAL | Status: DC
  Administered 2022-12-07 – 2022-12-22 (×15): 50 mg via ORAL
  Filled 2022-12-06 (×17): qty 1

## 2022-12-06 MED ORDER — ATORVASTATIN CALCIUM 40 MG PO TABS
40.0000 mg | ORAL_TABLET | Freq: Every day | ORAL | Status: DC
  Administered 2022-12-06 – 2022-12-26 (×21): 40 mg via ORAL
  Filled 2022-12-06 (×21): qty 1

## 2022-12-06 MED ORDER — EMPAGLIFLOZIN 10 MG PO TABS
10.0000 mg | ORAL_TABLET | Freq: Every day | ORAL | Status: DC
  Administered 2022-12-06 – 2022-12-08 (×3): 10 mg via ORAL
  Filled 2022-12-06 (×3): qty 1

## 2022-12-06 MED ORDER — PANTOPRAZOLE SODIUM 40 MG PO TBEC
40.0000 mg | DELAYED_RELEASE_TABLET | Freq: Every day | ORAL | Status: DC
  Administered 2022-12-06: 40 mg via ORAL
  Filled 2022-12-06: qty 1

## 2022-12-06 MED ORDER — ONDANSETRON HCL 4 MG/2ML IJ SOLN: 4.0000 mg | Freq: Four times a day (QID) | INTRAMUSCULAR | Status: DC | PRN

## 2022-12-06 NOTE — ED Notes (Signed)
Provider spoke to the pt about the diarrhea with potassium. Pt agrees to take the potassium.

## 2022-12-06 NOTE — Assessment & Plan Note (Signed)
PT/OT

## 2022-12-06 NOTE — ED Provider Notes (Signed)
[place] EMERGENCY DEPARTMENT AT [facility] Provider Note   CSN: [id number] Arrival date & time: 12/05/22  2049     History  Chief Complaint  Patient presents with   Weakness    Roy [name] is a 77 y.o. male.  Patient with past medical history significant for type II DM, CHF, nonischemic cardiomyopathy, hypertension, implantable cardioverter defibrillator presents to the emergency room complaining of worsening weakness.  Patient states he has had worsening weakness over the past 8+ months.  He states that today he slipped out of his chair and was unable to stand and walk due to weakness.  He denies syncope, denies hitting his head, denies any injury related to the slip from chair.  Patient currently denying chest pain, shortness of breath.  Endorsing generalized weakness.   Weakness      Home Medications Prior to Admission medications   Medication Sig Start Date End Date Taking? Authorizing Provider  ACCU-CHEK GUIDE test strip  10/13/20   [provider]  Apoaequorin (PREVAGEN PO) Take 1 tablet by mouth at bedtime.    [provider]  aspirin EC 81 MG tablet Take 81 mg by mouth daily. Swallow whole.    [provider]  atorvastatin (LIPITOR) 40 MG tablet TAKE 1 TABLET BY MOUTH DAILY. 05/20/22   O'NealRonnald Ramp, MD  B-Complex TABS Take 1 tablet by mouth daily.    [provider]  Blood Glucose Monitoring Suppl (ACCU-CHEK GUIDE ME) w/Device KIT  10/13/20   [provider]  Cholecalciferol (VITAMIN D) 50 MCG (2000 UT) CAPS Take 2,000 Units by mouth daily.    [provider]  glucosamine-chondroitin 500-400 MG tablet Take 1 tablet by mouth See admin instructions. 5 times weekly    [provider]  JARDIANCE 10 MG TABS tablet TAKE 1 TABLET BY MOUTH DAILY BEFORE BREAKFAST. 08/25/22   Marinus Maw, MD  Lancets 33G MISC See admin instructions. 10/13/20   [provider]  losartan (COZAAR) 25 MG  tablet Take 1 tablet (25 mg total) by mouth daily. 11/11/22 02/09/23  Sande Rives, MD  metFORMIN (GLUCOPHAGE) 1000 MG tablet Take 1,000 mg by mouth 2 (two) times daily with a meal.     [provider]  metoprolol (TOPROL-XL) 200 MG 24 hr tablet TAKE 1 TABLET (200 MG TOTAL) BY MOUTH DAILY. 10/04/22   O'NealRonnald Ramp, MD  Multiple Vitamin (MULTIVITAMIN WITH MINERALS) TABS tablet Take 1 tablet by mouth See admin instructions. 5 times weekly    [provider]  omeprazole (PRILOSEC) 20 MG capsule Take 20 mg by mouth daily.    [provider]  potassium chloride SA (KLOR-CON M) 20 MEQ tablet TAKE 1 TABLET (20 MEQ TOTAL) BY MOUTH DAILY. 04/18/22   Marinus Maw, MD  spironolactone (ALDACTONE) 25 MG tablet TAKE 1/2 TABLET BY MOUTH DAILY. 01/05/22   O'NealRonnald Ramp, MD  tamsulosin (FLOMAX) 0.4 MG CAPS capsule Take 0.4 mg by mouth at bedtime.    [provider]  torsemide (DEMADEX) 20 MG tablet Take 1 tablet (20 mg total) by mouth 3 (three) times a week. 09/24/21   Robbie Lis M, PA-C  vitamin B-12 (CYANOCOBALAMIN) 1000 MCG tablet Take 1,000 mcg by mouth See admin instructions. 5 times a week    [provider]      Allergies    Patient has no known allergies.    Review of Systems   Review of Systems  Neurological:  Positive for  weakness.    Physical Exam Updated Vital Signs BP (!) 137/90   Pulse 77   Temp 97.9 F (36.6 C) (Oral)   Resp (!) 24   Ht 6' (1.829 m)   Wt 87.1 kg   SpO2 100%   BMI 26.04 kg/m  Physical Exam Vitals and nursing note reviewed.  Constitutional:      General: He is not in acute distress.    Appearance: He is well-developed.  HENT:     Head: Normocephalic and atraumatic.  Eyes:     Conjunctiva/sclera: Conjunctivae normal.  Cardiovascular:     Rate and Rhythm: Normal rate and regular rhythm.  Pulmonary:     Effort: Pulmonary effort is normal. No respiratory distress.     Breath sounds:  Normal breath sounds.  Abdominal:     Palpations: Abdomen is soft.     Tenderness: There is no abdominal tenderness.  Musculoskeletal:        General: No swelling.     Cervical back: Neck supple.     Right lower leg: No edema.     Left lower leg: No edema.  Skin:    General: Skin is warm and dry.     Capillary Refill: Capillary refill takes less than 2 seconds.  Neurological:     Mental Status: He is alert.  Psychiatric:        Mood and Affect: Mood normal.     ED Results / Procedures / Treatments   Labs (all labs ordered are listed, but only abnormal results are displayed) Labs Reviewed  CBC WITH DIFFERENTIAL/PLATELET - Abnormal; Notable for the following components:      Result Value   WBC 11.9 (*)    RBC 3.53 (*)    Hemoglobin 10.3 (*)    HCT 33.1 (*)    Neutro Abs 10.1 (*)    All other components within normal limits  COMPREHENSIVE METABOLIC PANEL - Abnormal; Notable for the following components:   Chloride 113 (*)    CO2 18 (*)    Glucose, Bld 110 (*)    Creatinine, Ser 1.31 (*)    Calcium 8.2 (*)    Total Protein 6.3 (*)    Albumin 3.4 (*)    GFR, Estimated 55 (*)    All other components within normal limits  BRAIN NATRIURETIC PEPTIDE - Abnormal; Notable for the following components:   B Natriuretic Peptide 1,431.6 (*)    All other components within normal limits  MAGNESIUM - Abnormal; Notable for the following components:   Magnesium 0.9 (*)    All other components within normal limits  URINALYSIS, W/ REFLEX TO CULTURE (INFECTION SUSPECTED)    EKG EKG Interpretation Date/Time:  Monday December 05 2022 22:25:11 EST Ventricular Rate:  79 PR Interval:  175 QRS Duration:  120 QT Interval:  446 QTC Calculation: 512 R Axis:   -46  Text Interpretation: Sinus rhythm Multiple ventricular premature complexes Incomplete RBBB and LAFB Nonspecific T abnormalities, lateral leads Confirmed by Nicanor Alcon, April ([id number]) on 12/05/2022 11:47:42 PM  Radiology DG Chest  Port 1 View  Result Date: 12/05/2022 CLINICAL DATA:  Shortness of breath EXAM: PORTABLE CHEST 1 VIEW COMPARISON:  Chest x-ray 09/02/2022 FINDINGS: Sternotomy wires, mediastinal clips and left-sided pacemaker are again seen. The heart size and mediastinal contours are within normal limits. Both lungs are clear. The visualized skeletal structures are unremarkable. IMPRESSION: No active disease. Electronically Signed   By: Darliss Cheney M.D.   On: 12/05/2022 23:55    Procedures .  Critical Care  Performed by: Darrick Grinder, PA-C Authorized by: Darrick Grinder, PA-C   Critical care provider statement:    Critical care time (minutes):  30   Critical care was necessary to treat or prevent imminent or life-threatening deterioration of the following conditions: Hypomagnesemia, magnesium 0.9.   Critical care was time spent personally by me on the following activities:  Development of treatment plan with patient or surrogate, discussions with consultants, evaluation of patient's response to treatment, examination of patient, ordering and review of laboratory studies, ordering and review of radiographic studies, ordering and performing treatments and interventions, pulse oximetry, re-evaluation of patient's condition and review of old charts   Care discussed with: admitting provider       Medications Ordered in ED Medications  magnesium sulfate IVPB 2 g 50 mL (has no administration in time range)  magnesium sulfate IVPB 2 g 50 mL (2 g Intravenous New Bag/Given 12/05/22 2321)    ED Course/ Medical Decision Making/ A&P                                 Medical Decision Making Amount and/or Complexity of Data Reviewed Labs: ordered. Radiology: ordered.  Risk Prescription drug management.   This patient presents to the ED for concern of generalized weakness, this involves an extensive number of treatment options, and is a complaint that carries with it a high risk of complications and  morbidity.  The differential diagnosis includes electrolyte abnormality, hypoglycemia, infection, deconditioning, CVA, pneumonia, others   Co morbidities that complicate the patient evaluation  ICD in place, CHF, type II DM, history of syncope, B-cell lymphoma   Additional history obtained:  Additional history obtained from EMS External records from outside source obtained and reviewed including oncology notes   Lab Tests:  I Ordered, and personally interpreted labs.  The pertinent results include:  BNP 1431.6, Magnesium 0.9, Hgb 10.3   Imaging Studies ordered:  I ordered imaging studies including chest x-ray  I independently visualized and interpreted imaging which showed no acute findings I agree with the radiologist interpretation   Cardiac Monitoring: / EKG:  The patient was maintained on a cardiac monitor.  I personally viewed and interpreted the cardiac monitored which showed an underlying rhythm of: sinus rhythm   Consultations Obtained:  I requested consultation with the hospitalist, Dr.Gardner  and discussed lab and imaging findings as well as pertinent plan - they recommend: admission, will be a carry over until the day shift arrives   Problem List / ED Course / Critical interventions / Medication management   I ordered medication including magnesium for hypomagnesemia Reevaluation of the patient after these medicines showed that the patient stayed the same I have reviewed the patients home medicines and have made adjustments as needed    Test / Admission - Considered:  Patient with severely low magnesium levels and elevated BNP showing possible worsening of heart failure.  Patient will benefit from admission for observation and further workup.         Final Clinical Impression(s) / ED Diagnoses Final diagnoses:  Hypomagnesemia  Generalized weakness  Elevated brain natriuretic peptide (BNP) level    Rx / DC Orders ED Discharge Orders      None         Pamala Duffel 12/06/22 0026    Palumbo, April, MD 12/06/22 0112

## 2022-12-06 NOTE — Assessment & Plan Note (Addendum)
Hypokalemia. Hypomagnesemia. Non anion gap metabolic acidosis.   Renal function has been improving and electrolytes have been corrected.  Patient is tolerating po well.  Holding diuretic therapy including SGLT 2 inh.  Check renal function and electrolytes in am.

## 2022-12-06 NOTE — H&P (Signed)
History and Physical    Patient: Roy [name] VQM:[id number] DOB: 07-May-1945 DOA: 12/05/2022 DOS: the patient was seen and examined on 12/06/2022 PCP: Irena Reichmann, DO  Patient coming from: Home  Chief Complaint:  Chief Complaint  Patient presents with   Weakness   HPI: [name] is a 77 y.o. male with medical history significant of NICM, CHF s/p ICD placement, DM2, HTN.  Pt in to ED with c/o worsening weakness.  This ongoing over past 8 months but does seem worse over past couple of weeks especially.  Today slipped out of his chair and unable to stand up due to weakness.  No CP, SOB, peripheral edema.  Notably pt switched from Entresto to Losartan a couple of weeks ago due to cough that was suspected to be due to entresto.  Since that time his AICD has noted a severe spike in PVC burden:       Cards noted this on 11/7, and actually had planned on bringing him in to the office next month.  Work up in ED is notable for elevated BNP of 1431 up from 235 a year ago.  Also hypomagnesemia of 0.9.   Review of Systems: As mentioned in the history of present illness. All other systems reviewed and are negative. Past Medical History:  Diagnosis Date   Allergic rhinitis    Arthritis    wrists   Cardiomyopathy, nonischemic [facility])    followed by cardiology--- dr Delton See---  2016 ef 45-50% ,  2016 nuclear ef 37%,  2017 per echo ef 50-55%   CHF (congestive heart failure), NYHA class III (HCC) 03/21/2020   Coronary artery disease    GERD (gastroesophageal reflux disease)    Hiatal hernia    History of kidney stones    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;   01/ 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   History of syncope (03-06-2020 pt stated has not had sycopal episode in few years, stated it seems to happen in extreme hot conditions)   cardiologist--- dr Eloy End--- dx recurrent syncope;  nuclear study 11-03-2014 intermediate risk w/ no  ischemia, apical hypokinesis, nuclear ef 37%;  event monitor-- 12-21-2015 SB/ ST  no pauses/ arrythmia's;  echo 08-03-2015 ef 50-55%   Hyperlipidemia    Hypertension    followed by pcp   Hypovitaminosis D    Mass of left testicle    Nocturia    Plantar fasciitis    Presence of surgical incision    01/ 2022  moh's w/ skin graft of nose, per pt still healing and wear bandage daily   Type 2 diabetes mellitus (HCC)    pt is adament that he is not and have been told he is a diabetic but a borderline;  followed by pcp, in pcp note states DM2 and takes 2 meds daily   Wears glasses    Wears hearing aid in both ears    Weight loss 11/16/2020   Past Surgical History:  Procedure Laterality Date   COLONOSCOPY  last one 01-30-2017   CORONARY ARTERY BYPASS GRAFT N/A 03/25/2020   Procedure: CORONARY ARTERY BYPASS GRAFTING (CABG), ON PUMP, TIMES FOUR, USING BILATERAL INTERNAL MAMMARY ARTERIES AND LEFT RADIAL ARTERY;  Surgeon: Linden Dolin, MD;  Location: [facility] OR;  Service: Open Heart Surgery;  Laterality: N/A;   ICD IMPLANT N/A 08/23/2021   Procedure: ICD IMPLANT;  Surgeon: Marinus Maw, MD;  Location: [facility] INVASIVE  CV LAB;  Service: Cardiovascular;  Laterality: N/A;   IR IMAGING GUIDED PORT INSERTION  07/06/2020   LOW ANTERIOR RESECTION RECTUM W/ COLOPROCTOSTOMY  05/2003   MOHS SURGERY  01/2020   nose w/ graft   ORCHIECTOMY Left 03/11/2020   Procedure: Clearance Coots;  Surgeon: Rene Paci, MD;  Location: [facility];  Service: Urology;  Laterality: Left;  ONLY NEEDS 60 MIN   PORTA CATH REMOVAL N/A 08/23/2021   Procedure: PORTA CATH REMOVAL;  Surgeon: Marinus Maw, MD;  Location[facility] INVASIVE CV LAB;  Service: Cardiovascular;  Laterality: N/A;   RADIAL ARTERY HARVEST Left 03/25/2020   Procedure: LEFT RADIAL ARTERY HARVEST;  Surgeon: Linden Dolin, MD;  Location: [facility] OR;  Service: Open Heart Surgery;  Laterality: Left;   RIGHT/LEFT HEART CATH AND CORONARY  ANGIOGRAPHY N/A 03/23/2020   Procedure: RIGHT/LEFT HEART CATH AND CORONARY ANGIOGRAPHY;  Surgeon: Corky Crafts, MD;  Location: [facility] INVASIVE CV LAB;  Service: Cardiovascular;  Laterality: N/A;   SHOULDER SURGERY Right 1992; 07/ 2021   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 Left 12/24/2008   left ear and nose    TEE WITHOUT CARDIOVERSION N/A 03/25/2020   Procedure: TRANSESOPHAGEAL ECHOCARDIOGRAM (TEE);  Surgeon: Linden Dolin, MD;  Location: [facility] OR;  Service: Open Heart Surgery;  Laterality: N/A;   TONSILLECTOMY AND ADENOIDECTOMY  child   UPPER GASTROINTESTINAL ENDOSCOPY  last one 06-06-2017   Social History:  reports that he quit smoking about 46 years ago. His smoking use included pipe. He has never used smokeless tobacco. He reports current alcohol use. He reports that he does not use drugs.  No Known Allergies  Family History  Problem Relation Age of Onset   Heart attack Mother    Stroke Father    Colon cancer Neg Hx    Esophageal cancer Neg Hx    Pancreatic cancer Neg Hx    Rectal cancer Neg Hx    Stomach cancer Neg Hx    Colon polyps Neg Hx     Prior to Admission medications   Medication Sig Start Date End Date Taking? Authorizing Provider  ACCU-CHEK GUIDE test strip  10/13/20   [provider]  Apoaequorin (PREVAGEN PO) Take 1 tablet by mouth at bedtime.    [provider]  aspirin EC 81 MG tablet Take 81 mg by mouth daily. Swallow whole.    [provider]  atorvastatin (LIPITOR) 40 MG tablet TAKE 1 TABLET BY MOUTH DAILY. 05/20/22   O'NealRonnald Ramp, MD  B-Complex TABS Take 1 tablet by mouth daily.    [provider]  Blood Glucose Monitoring Suppl (ACCU-CHEK GUIDE ME) w/Device KIT  10/13/20   [provider]  Cholecalciferol (VITAMIN D) 50 MCG (2000 UT) CAPS Take 2,000 Units by mouth daily.    [provider]  glucosamine-chondroitin 500-400 MG tablet Take 1 tablet by mouth See admin instructions. 5  times weekly    [provider]  JARDIANCE 10 MG TABS tablet TAKE 1 TABLET BY MOUTH DAILY BEFORE BREAKFAST. 08/25/22   Marinus Maw, MD  Lancets 33G MISC See admin instructions. 10/13/20   [provider]  losartan (COZAAR) 25 MG tablet Take 1 tablet (25 mg total) by mouth daily. 11/11/22 02/09/23  Sande Rives, MD  metFORMIN (GLUCOPHAGE) 1000 MG tablet Take 1,000 mg by mouth 2 (two) times daily with a meal.     [provider]  metoprolol (TOPROL-XL) 200 MG 24 hr  tablet TAKE 1 TABLET (200 MG TOTAL) BY MOUTH DAILY. 10/04/22   O'NealRonnald Ramp, MD  Multiple Vitamin (MULTIVITAMIN WITH MINERALS) TABS tablet Take 1 tablet by mouth See admin instructions. 5 times weekly    [provider]  omeprazole (PRILOSEC) 20 MG capsule Take 20 mg by mouth daily.    [provider]  potassium chloride SA (KLOR-CON M) 20 MEQ tablet TAKE 1 TABLET (20 MEQ TOTAL) BY MOUTH DAILY. 04/18/22   Marinus Maw, MD  spironolactone (ALDACTONE) 25 MG tablet TAKE 1/2 TABLET BY MOUTH DAILY. 01/05/22   O'NealRonnald Ramp, MD  tamsulosin (FLOMAX) 0.4 MG CAPS capsule Take 0.4 mg by mouth at bedtime.    [provider]  torsemide (DEMADEX) 20 MG tablet Take 1 tablet (20 mg total) by mouth 3 (three) times a week. 09/24/21   Robbie Lis M, PA-C  vitamin B-12 (CYANOCOBALAMIN) 1000 MCG tablet Take 1,000 mcg by mouth See admin instructions. 5 times a week    [provider]    Physical Exam: Vitals:   12/06/22 0100 12/06/22 0200 12/06/22 0300 12/06/22 0400  BP: 138/68 (!) 134/58 138/86 (!) 140/83  Pulse: 63 76 77 74  Resp: (!) 25 19 (!) 26 17  Temp: 98.2 F (36.8 C)     TempSrc: Oral     SpO2: 100% 100% 100% 100%  Weight:      Height:       Constitutional: NAD, calm, comfortable Respiratory: clear to auscultation bilaterally, no wheezing, no crackles. Normal respiratory effort. No accessory muscle use.  Cardiovascular: Frequent PVCs, trace TO 1+  BLE edema. Abdomen: no tenderness, no masses palpated. No hepatosplenomegaly. Bowel sounds positive.  Neurologic: CN 2-12 grossly intact. Sensation intact, DTR normal. Strength 5/5 in all 4.  Psychiatric: Normal judgment and insight. Alert and oriented x 3. Normal mood.   Data Reviewed:    Labs on Admission: I have personally reviewed following labs and imaging studies  CBC: Recent Labs  Lab 12/05/22 2134  WBC 11.9*  NEUTROABS 10.1*  HGB 10.3*  HCT 33.1*  MCV 93.8  PLT 254   Basic Metabolic Panel: Recent Labs  Lab 12/05/22 2134  NA 141  K 3.7  CL 113*  CO2 18*  GLUCOSE 110*  BUN 13  CREATININE 1.31*  CALCIUM 8.2*  MG 0.9*   GFR: Estimated Creatinine Clearance: 50.2 mL/min (A) (by C-G formula based on SCr of 1.31 mg/dL (H)). Liver Function Tests: Recent Labs  Lab 12/05/22 2134  AST 17  ALT 12  ALKPHOS 96  BILITOT 0.7  PROT 6.3*  ALBUMIN 3.4*   No results for input(s): "LIPASE", "AMYLASE" in the last 168 hours. No results for input(s): "AMMONIA" in the last 168 hours. Coagulation Profile: No results for input(s): "INR", "PROTIME" in the last 168 hours. Cardiac Enzymes: No results for input(s): "CKTOTAL", "CKMB", "CKMBINDEX", "TROPONINI" in the last 168 hours. BNP (last 3 results) Recent Labs    09/02/22 1000  PROBNP 609*   HbA1C: No results for input(s): "HGBA1C" in the last 72 hours. CBG: No results for input(s): "GLUCAP" in the last 168 hours. Lipid Profile: No results for input(s): "CHOL", "HDL", "LDLCALC", "TRIG", "CHOLHDL", "LDLDIRECT" in the last 72 hours. Thyroid Function Tests: No results for input(s): "TSH", "T4TOTAL", "FREET4", "T3FREE", "THYROIDAB" in the last 72 hours. Anemia Panel: No results for input(s): "VITAMINB12", "FOLATE", "FERRITIN", "TIBC", "IRON", "RETICCTPCT" in the last 72 hours. Urine analysis:    Component Value Date/Time   COLORURINE AMBER (A) 12/06/2022  0125   APPEARANCEUR HAZY (A) 12/06/2022 0125   LABSPEC 1.028  12/06/2022 0125   PHURINE 5.0 12/06/2022 0125   GLUCOSEU >=500 (A) 12/06/2022 0125   HGBUR NEGATIVE 12/06/2022 0125   BILIRUBINUR NEGATIVE 12/06/2022 0125   KETONESUR 5 (A) 12/06/2022 0125   PROTEINUR NEGATIVE 12/06/2022 0125   UROBILINOGEN 1.0 03/12/2013 1936   NITRITE NEGATIVE 12/06/2022 0125   LEUKOCYTESUR NEGATIVE 12/06/2022 0125    Radiological Exams on Admission: DG Chest Port 1 View  Result Date: 12/05/2022 CLINICAL DATA:  Shortness of breath EXAM: PORTABLE CHEST 1 VIEW COMPARISON:  Chest x-ray 09/02/2022 FINDINGS: Sternotomy wires, mediastinal clips and left-sided pacemaker are again seen. The heart size and mediastinal contours are within normal limits. Both lungs are clear. The visualized skeletal structures are unremarkable. IMPRESSION: No active disease. Electronically Signed   By: Darliss Cheney M.D.   On: 12/05/2022 23:55    EKG: Independently reviewed.   Assessment and Plan: * Frequent PVCs Pt with generalized weakness and fatigue, chronic but worse in the past couple of weeks since he got taken off of entresto and switched to losartan due to cough. Cards noted marked uptick in PVC frequency in the same time period. Tele monitor Replace Mg Sending message to P. Trent for cards eval in AM.  Generalized weakness PT/OT  Hypomagnesemia Replacing Mg, got 4gm IV in ED.  Nonischemic cardiomyopathy (HCC) BNP noted to be significantly elevated, no pulm edema, no increased peripheral edema however.  Just increase in PVC burden. Continue losartan, aldactone, toprol, demadex Defer to cards if they want any specific repeat 2d echo.  CKD (chronic kidney disease) stage 3, GFR 30-59 ml/min (HCC) Creat of 1.3 today is actually the best its looked in a long time (probably due to stopping entresto and going on losartan instead).  Type 2 diabetes mellitus with other circulatory complications (HCC) Cont jardiance. Hold metformin - given creat is usually above 1.5, not so sure he  should be on this long term anyhow. 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      Advance Care Planning:   Code Status: Full Code  Consults: Message sent to P. Trent for cards eval in AM.  Family Communication: No family in room  Severity of Illness: The appropriate patient status for this patient is OBSERVATION. Observation status is judged to be reasonable and necessary in order to provide the required intensity of service to ensure the patient's safety. The patient's presenting symptoms, physical exam findings, and initial radiographic and laboratory data in the context of their medical condition is felt to place them at decreased risk for further clinical deterioration. Furthermore, it is anticipated that the patient will be medically stable for discharge from the hospital within 2 midnights of admission.   Author: Hillary Bow., DO 12/06/2022 4:20 AM  For on call review [email or web address].

## 2022-12-06 NOTE — Telephone Encounter (Signed)
Sharon Left vm on triage line with concerns of increased weakness  no energy Increase in fatigue Wife felt above symptoms are related to reent change from entresto to losartan    At chart review pt currently admitted Will sign encounter

## 2022-12-06 NOTE — ED Notes (Signed)
Pt refusing potassium. He states it gives him diarrhea.

## 2022-12-06 NOTE — Assessment & Plan Note (Deleted)
Pt with generalized weakness and fatigue, chronic but worse in the past couple of weeks since he got taken off of entresto and switched to losartan due to cough. Cards noted marked uptick in PVC frequency in the same time period. Tele monitor Replace Mg Sending message to P. Trent for cards eval in AM.

## 2022-12-06 NOTE — Consult Note (Addendum)
He has got a history of39 ER 37   Cardiology Consultation   Patient ID: [name] MRN: [id number]; DOB: 07/03/1945 Chronic HFrEF a looks like nonischemic/mixed ischemic kind of thing because he has had EF as well as leg Admit date: 12/05/2022 Date of Consult: 12/06/2022  PCP:  Irena Reichmann, DO   [facility] Providers Cardiologist:  Reatha Harps, MD   {   Patient Profile:   Roy [name] is a 77 y.o. male with a hx of chronic HFrEF with Biotronik ICD, CAD status post CABG 03/2020, primary testicular lymphoma status post chemo/radiation (in remission), type 2 diabetes, CKD, squamous cell carcinoma, hypertension, hyperlipidemia who is being seen 12/06/2022 for the evaluation of weakness/PVCs at the request of Dr. Radonna Ricker.  History of Present Illness:   Roy [name] has chronic HFrEF that is presumably nonischemic cardiomyopathy however seems to be a mixed picture.  He has had documented EF in 2016 between 45 to 50% with regional wall motion abnormalities in the LAD distribution.  Later that year in October had reduced EF and then had normalization of EF in the following year 2017.  He was admitted in February 2022 for heart failure and noted to have three-vessel CAD and an EF of 25%.  Underwent four-vessel CABG.  Later that year underwent left testicular resection (felt to be encapsulated with no evidence of metastases) he also had chemo with intrathecal methotrexate after CABG in May 2022.  Over the years his EF has been persistently low and has fluctuated generally between 20 to 35% in the last couple years.  Also has had a previous cardiac MRI in 2023 that did not note any significant findings.  Underwent Biotronik single-chamber ICD in July 2023.  He was briefly followed by advanced heart failure previously.  He was last seen by Dr. Bufford Buttner in August 2024.  He had some complaints of a cough and crackles in the lung bases but had normal chest x-ray.  Also had updated his  echocardiogram that did not show any significant changes. Recently he developed a cough while on Entresto.  This was stopped and transition to losartan with resolution of cough.  He also is not on any diuretics and takes these as needed but rarely.  Today patient has been admitted for persistent weakness/PVCs.  He is accompanied with his wife today who reports that they have been dealing with weakness, but more specifically motor function weakness since about January but has noticed a significant decline in April and within the last month.  He states that he was able to ambulate and complete his ADLs without difficulty in January, in April he started noticing more difficulty doing this however still able to complete.  Now within the past month he has significant challenges ambulating.  He used to be able to walk 200 feet up and down his driveway twice a day however is unable to do this now.  He had just walked with PT with noted left leg dragging however no significant gait disturbances.  While interviewing him he struggled to sit up straight and was leaning over persistently.  His wife also notes that he has been significantly more somnolent and sleeping longer hours and taking more naps.  Both parties have also noted decreased appetite, unintentional weight loss, increased urinary incontinence, increased falls.  They denied any recent travel, viral symptoms such as fever, myalgias, abdominal pain, significant diarrhea.  He reports compliancy with medications and does not exhibit any significant chest pain,  shortness of breath, peripheral edema, dizziness, palpitations, syncope.  He reports compliancy with all of his medications.  Patient also reports recently seen his oncologist and notified them of his weakness.  Does not appear that he had any further workup after this.  Did state that they wanted to do a CT scan however is unaware of exactly what.  Potassium has dropped from 3.7-2.9 today.  Magnesium on  admission was 0.9.Patient is given our number creatinine 1.41.  Hemoglobin 9.9.  Chest x-ray negative.  BNP 1431.  Mildly elevated WBC 11.9.  10.1 neutrophil count.   Past Medical History:  Diagnosis Date   Allergic rhinitis    Arthritis    wrists   Cardiomyopathy, nonischemic [facility])    followed by cardiology--- dr Delton See---  2016 ef 45-50% ,  2016 nuclear ef 37%,  2017 per echo ef 50-55%   CHF (congestive heart failure), NYHA class III (HCC) 03/21/2020   Coronary artery disease    GERD (gastroesophageal reflux disease)    Hiatal hernia    History of kidney stones    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;   01/ 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   History of syncope (03-06-2020 pt stated has not had sycopal episode in few years, stated it seems to happen in extreme hot conditions)   cardiologist--- dr Eloy End--- dx recurrent syncope;  nuclear study 11-03-2014 intermediate risk w/ no ischemia, apical hypokinesis, nuclear ef 37%;  event monitor-- 12-21-2015 SB/ ST  no pauses/ arrythmia's;  echo 08-03-2015 ef 50-55%   Hyperlipidemia    Hypertension    followed by pcp   Hypovitaminosis D    Mass of left testicle    Nocturia    Plantar fasciitis    Presence of surgical incision    01/ 2022  moh's w/ skin graft of nose, per pt still healing and wear bandage daily   Type 2 diabetes mellitus (HCC)    pt is adament that he is not and have been told he is a diabetic but a borderline;  followed by pcp, in pcp note states DM2 and takes 2 meds daily   Wears glasses    Wears hearing aid in both ears    Weight loss 11/16/2020    Past Surgical History:  Procedure Laterality Date   COLONOSCOPY  last one 01-30-2017   CORONARY ARTERY BYPASS GRAFT N/A 03/25/2020   Procedure: CORONARY ARTERY BYPASS GRAFTING (CABG), ON PUMP, TIMES FOUR, USING BILATERAL INTERNAL MAMMARY ARTERIES AND LEFT RADIAL ARTERY;  Surgeon: Linden Dolin, MD;  Location: [facility] OR;  Service:  Open Heart Surgery;  Laterality: N/A;   ICD IMPLANT N/A 08/23/2021   Procedure: ICD IMPLANT;  Surgeon: Marinus Maw, MD;  Location: [facility] INVASIVE CV LAB;  Service: Cardiovascular;  Laterality: N/A;   IR IMAGING GUIDED PORT INSERTION  07/06/2020   LOW ANTERIOR RESECTION RECTUM W/ COLOPROCTOSTOMY  05/2003   MOHS SURGERY  01/2020   nose w/ graft   ORCHIECTOMY Left 03/11/2020   Procedure: Clearance Coots;  Surgeon: Rene Paci, MD;  Location: [facility];  Service: Urology;  Laterality: Left;  ONLY NEEDS 60 MIN   PORTA CATH REMOVAL N/A 08/23/2021   Procedure: PORTA CATH REMOVAL;  Surgeon: Marinus Maw, MD;  Location: [facility] INVASIVE CV LAB;  Service: Cardiovascular;  Laterality: N/A;   RADIAL ARTERY HARVEST Left 03/25/2020   Procedure: LEFT RADIAL ARTERY HARVEST;  Surgeon: Vickey Sages,  Merri Brunette, MD;  Location: [facility] OR;  Service: Open Heart Surgery;  Laterality: Left;   RIGHT/LEFT HEART CATH AND CORONARY ANGIOGRAPHY N/A 03/23/2020   Procedure: RIGHT/LEFT HEART CATH AND CORONARY ANGIOGRAPHY;  Surgeon: Corky Crafts, MD;  Location: [facility] INVASIVE CV LAB;  Service: Cardiovascular;  Laterality: N/A;   SHOULDER SURGERY Right 1992; 07/ 2021   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 Left 12/24/2008   left ear and nose    TEE WITHOUT CARDIOVERSION N/A 03/25/2020   Procedure: TRANSESOPHAGEAL ECHOCARDIOGRAM (TEE);  Surgeon: Linden Dolin, MD;  Location: [facility] OR;  Service: Open Heart Surgery;  Laterality: N/A;   TONSILLECTOMY AND ADENOIDECTOMY  child   UPPER GASTROINTESTINAL ENDOSCOPY  last one 06-06-2017    Inpatient Medications: Scheduled Meds:  aspirin EC  81 mg Oral Daily   atorvastatin  40 mg Oral Daily   empagliflozin  10 mg Oral QAC breakfast   enoxaparin (LOVENOX) injection  40 mg Subcutaneous Q24H   insulin aspart  0-5 Units Subcutaneous QHS   insulin aspart  0-9 Units Subcutaneous TID WC   losartan  25 mg Oral Daily   magnesium oxide  400 mg Oral BID    metoprolol  200 mg Oral Daily   pantoprazole  40 mg Oral Daily   potassium chloride  40 mEq Oral BID   spironolactone  12.5 mg Oral Daily   Continuous Infusions:  PRN Meds: acetaminophen **OR** acetaminophen, ondansetron **OR** ondansetron (ZOFRAN) IV  Allergies:   No Known Allergies  Social History:   Social History   Socioeconomic History   Marital status: Married    Spouse name: Not on file   Number of children: Not on file   Years of education: Not on file   Highest education level: Not on file  Occupational History   Not on file  Tobacco Use   Smoking status: Former    Types: Pipe    Quit date: 11/29/1976    Years since quitting: 46.0   Smokeless tobacco: Never  Vaping Use   Vaping status: Never Used  Substance and Sexual Activity   Alcohol use: Yes    Alcohol/week: 0.0 standard drinks of alcohol    Comment: Occasional drink    Drug use: Never   Sexual activity: Not on file  Other Topics Concern   Not on file  Social History Narrative   Not on file   Social Determinants of Health   Financial Resource Strain: Not on file  Food Insecurity: Not on file  Transportation Needs: Not on file  Physical Activity: Not on file  Stress: Not on file  Social Connections: Not on file  Intimate Partner Violence: Not on file    Family History:   Family History  Problem Relation Age of Onset   Heart attack Mother    Stroke Father    Colon cancer Neg Hx    Esophageal cancer Neg Hx    Pancreatic cancer Neg Hx    Rectal cancer Neg Hx    Stomach cancer Neg Hx    Colon polyps Neg Hx      ROS:  Please see the history of present illness All other ROS reviewed and negative.     Physical Exam/Data:   Vitals:   12/06/22 0850 12/06/22 1030 12/06/22 1100 12/06/22 1200  BP:  131/76 126/79 125/73  Pulse:  88 88 79  Resp:  (!) 23 (!) 33 (!) 24  Temp: 98.6 F (37 C)     TempSrc:  Oral     SpO2:  100% 100% 98%  Weight:      Height:        Intake/Output Summary (Last  24 hours) at 12/06/2022 1312 Last data filed at 12/06/2022 0223 Gross per 24 hour  Intake 100 ml  Output --  Net 100 ml      12/05/2022    8:52 PM 11/07/2022    1:02 PM 09/02/2022    8:27 AM  Last 3 Weights  Weight (lbs) 192 lb 207 lb 1.6 oz 203 lb 12.8 oz  Weight (kg) 87.091 kg 93.94 kg 92.443 kg     Body mass index is 26.04 kg/m.  General:  Well nourished, well developed, in no acute distress HEENT: normal Neck: no JVD Vascular: No carotid bruits; Distal pulses 2+ bilaterally Cardiac:  normal S1, S2; RRR; no murmur  Lungs:  clear to auscultation bilaterally, no wheezing, rhonchi or rales  Abd: soft, nontender, no hepatomegaly  Ext: no edema Musculoskeletal:  No deformities, BUE and BLE strength normal and equal Skin: warm and dry  Neuro:  CNs 2-12 intact, no focal abnormalities noted Psych:  Normal affect   EKG:  The EKG was personally reviewed and demonstrates: Sinus rhythm, heart rate 79.  Frequent PVCs.  Diffuse nonspecific T wave changes Telemetry:  Telemetry was personally reviewed and demonstrates: Normal sinus rhythm, frequent PVCs.  Sometimes in bigeminy  Relevant CV Studies: Echocardiogram 09/29/2022 1. Left ventricular ejection fraction, by estimation, is 25 to 30% . Left ventricular ejection fraction by PLAX is 32 % . The left ventricle has severely decreased function. The left ventricle demonstrates global hypokinesis. Left ventricular diastolic parameters are consistent with Grade I diastolic dysfunction ( impaired relaxation) . 2. Right ventricular systolic function is mildly reduced. The right ventricular size is normal. Tricuspid regurgitation signal is inadequate for assessing PA pressure. 3. Left atrial size was moderately dilated. 4. The mitral valve is normal in structure. No evidence of mitral valve regurgitation. No evidence of mitral stenosis. 5. The aortic valve is tricuspid. There is mild calcification of the aortic valve. Aortic valve regurgitation is not  visualized. Aortic valve sclerosis is present, with no evidence of aortic valve stenosis. 6. There is borderline dilatation of the aortic root, measuring 38 mm. 7. The inferior vena cava is normal in size with greater than 50% respiratory variability, suggesting right atrial pressure of 3 mmHg.  Right left heart catheterization 03/23/2020 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 Prox RCA-2 lesion is 50% stenosed. Dist RCA lesion is 50% stenosed. Ost Cx lesion is 90% stenosed. Mid LM lesion is 80% stenosed. Ost LAD lesion is 75% stenosed. Mid Cx to Dist Cx lesion is 75% stenosed. 2nd Mrg lesion is 75% stenosed. 2nd Diag lesion is 75% stenosed. Mid LAD-1 lesion is 75% stenosed. Mid LAD-2 lesion is 75% stenosed. 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 The left ventricular ejection fraction is 25-35% by visual estimate. LV end diastolic pressure is normal. There is no aortic valve stenosis. Ao sat 94%, PA sat 66%, PA pressure 27/6, mean PA 15 mm Hg; mean PCWP 8 mm Hg, CO 7.5 L/min; CI 3.3. Normal right heart pressures.   Severe, calcific multivessel disease. Cardiac surgery consult.     Results conveyed to his wife by phone, and to Dr. Flora Lipps.   Laboratory Data:  High Sensitivity Troponin:  No results for input(s): "TROPONINIHS" in the last 720 hours.   Chemistry Recent Labs  Lab 12/05/22 2134 12/06/22 0506  NA 141  141  K 3.7 2.9*  CL 113* 113*  CO2 18* 17*  GLUCOSE 110* 102*  BUN 13 12  CREATININE 1.31* 1.41*  CALCIUM 8.2* 8.1*  MG 0.9*  --   GFRNONAA 55* 51*  ANIONGAP 10 11    Recent Labs  Lab 12/05/22 2134  PROT 6.3*  ALBUMIN 3.4*  AST 17  ALT 12  ALKPHOS 96  BILITOT 0.7   Lipids No results for input(s): "CHOL", "TRIG", "HDL", "LABVLDL", "LDLCALC", "CHOLHDL" in the last 168 hours.  Hematology Recent Labs  Lab 12/05/22 2134 12/06/22 0506  WBC 11.9* 9.3  RBC 3.53* 3.35*  HGB 10.3* 9.9*  HCT 33.1* 31.2*  MCV 93.8 93.1  MCH 29.2 29.6  MCHC 31.1  31.7  RDW 12.6 12.6  PLT 254 246   Thyroid No results for input(s): "TSH", "FREET4" in the last 168 hours.  BNP Recent Labs  Lab 12/05/22 2134  BNP 1,431.6*    DDimer No results for input(s): "DDIMER" in the last 168 hours.   Radiology/Studies:  DG Chest Port 1 View  Result Date: 12/05/2022 CLINICAL DATA:  Shortness of breath EXAM: PORTABLE CHEST 1 VIEW COMPARISON:  Chest x-ray 09/02/2022 FINDINGS: Sternotomy wires, mediastinal clips and left-sided pacemaker are again seen. The heart size and mediastinal contours are within normal limits. Both lungs are clear. The visualized skeletal structures are unremarkable. IMPRESSION: No active disease. Electronically Signed   By: Darliss Cheney M.D.   On: 12/05/2022 23:55     Assessment and Plan:   Weakness He has been reporting chronic weakness that has progressed over the last year since January.  He has got concerning symptoms and I question if related to his testicular cancer however this has been reported to be encapsulated and in remission since 2022.  However he has been reporting more frequent falls, significant motor function weakness, unintentional weight loss, lack of appetite, more somnolence.  I do not think this is a result of any cardiac etiology or his PVCs given that his EF has been stable and he is overall well compensated and compliant with all of his medications.  He does not exhibit any significant cardiac symptoms. He is slightly normal TSH 3 months ago.  Slightly abnormal neutrophil count.  Unclear etiology.   PVCs Electrolyte derangements This is in the setting of a potassium of 2.9, magnesium 0.9.  He is not on a diuretic.  Again, I do not think this is related to his weakness. Continue to correct electrolytes, will interrogate his device to get a timeline of his PVCs and burden.  Chronic HFrEF with Biotronik single-chamber ICD Mixed cardiomyopathy EF persistently around 20 to 35%.  Most recent echo in September 2024  shows EF 25 to 30% with mildly reduced RV function currently appears to be well compensated and does not require diuretics.  He is asymptomatic, euvolemic. Developed cough with Sherryll Burger so he is now on losartan 25 mg.  Continue Jardiance 10 mg, Toprol-XL 200 mg, spironolactone 12.5 mg. Can consider repeating echocardiogram however I do not feel this is necessary  CAD status post CABG x 4 2022 Seems to be stable.  No anginal complaints.  LDL 59 3 months ago. Continue aspirin, atorvastatin 40 mg, beta-blocker BP is well-controlled.  Testicular lymphoma Previously had underwent radiation, chemo and resection.  In remission since 2022.  Advise reaching back out to oncologist.  Risk Assessment/Risk Scores:  New York Heart Association (NYHA) Functional Class NYHA Class II        For  questions or updates, please contact [facility] Please consult [email or web address] for contact info under    Signed, Abagail Kitchens, PA-C  12/06/2022 1:12 PM   I have seen and examined the patient along with Abagail Kitchens, PA-C .  I have reviewed the chart, notes and new data.  I agree with PA/NP's note.  Key new complaints: Complaints are primarily of reduced activity level.  He is now sleeping least 12 hours a day, sometimes more than that.  He goes to the recliner after he wakes up in the morning and usually does not get out of the recliner after that.  He has had 3 falls, 1 more serious outside and 2 less significant in the house.  None of them were associated with dizziness or syncope.  Has not had true focal neurological problems.  He does not seem to have any true cognitive deficits, but is slower than usual. Key examination changes: Normal jugular pulsations, clear lungs, no S3, no leg edema.  Healthy subclavian ICD site. Key new findings / data: Reviewed pacemaker download 12/01/2022 there is a significant increase in PVC burden just the last 3 weeks, but otherwise shows normal device function, no  episodes of VT. He is not pacemaker dependent this is in fact he has 97% atrial sensed, ventricular sensed rhythm and 3% PVCs). BNP is mildly elevated compared to baseline and his thoracic impedance is slightly decreased compared to baseline, both suggesting fluid overload, but the chest x-ray does not show evidence of pulmonary edema. Surprisingly severe electrolyte abnormalities include magnesium 0.9 on admission and potassium 2.3 today.  TSH was normal 3 months ago. He had an echocardiogram performed very recently in September unchanged from previous findings with EF 25-30%, grade 1 diastolic dysfunction without evidence of elevated filling pressures and no significant valvular abnormalities.  PLAN: There are only some very subtle abnormalities on his workup that are changed from his otherwise longstanding cardiac problems.  There is a meaningful increase in the burden of PVCs, but this is nonspecific and could be due to either cardiac or extracardiac factors.  His BNP is slightly elevated but clinically he does not have evidence of heart failure exacerbation.  Symptoms sound neurological to me.  Does not have any findings to suggest dementia.  Other than what sounds like anhedonia, he does not have a lot of symptoms of depression.  One could make arguments for an adjustment in his diuretic regimen to account for the elevated BNP, but I doubt this will make any difference in his major complaints.  Would like to make sure that his electrolyte abnormalities are corrected first.  He was intolerant of Entresto due to cough.  We should increase his dose of losartan for improved treatment for systolic heart failure.  Thurmon Fair, MD, [facility] [phone] 12/06/2022, 3:54 PM

## 2022-12-06 NOTE — Assessment & Plan Note (Addendum)
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 Follow up with oncology recommendations.

## 2022-12-06 NOTE — Progress Notes (Signed)
Heart Failure Navigator Progress Note  Assessed for Heart & Vascular TOC clinic readiness.  Patient does not meet criteria due to Advanced Heart Failure Team patient of Dr. Bensimhon.   Navigator will sign off at this time.   Lucilia Yanni, BSN, RN Heart Failure Nurse Navigator Secure Chat Only   

## 2022-12-06 NOTE — Assessment & Plan Note (Signed)
BNP noted to be significantly elevated, no pulm edema, no increased peripheral edema however.  Just increase in PVC burden. Continue losartan, aldactone, toprol, demadex Defer to cards if they want any specific repeat 2d echo.

## 2022-12-06 NOTE — Evaluation (Signed)
Occupational Therapy Evaluation Patient Details Name: Roy [name] MRN: [id number] DOB: 03/02/1945 Today's Date: 12/06/2022   History of Present Illness Pt is a 77 y/o male seen in ED on 12/05/22 for weakness and slipping out of chair at home unable to stand up.  Noted to have on AICD severe spike in PVC and pt with hypomagnesemia. PMH - testicular CA s/p orchiecetomy, large B cell lymphoma, HTN, HLD, DM, CAD s/p CABG x 4 2022, CHF, cardiomyopathy with ICD implant 7/23, recurrent syncope.   Clinical Impression   Patient admitted for the diagnosis above.  PTA he lives at home with his spouse, who cannot provide physical assist.  Patient and spouse admit to increasing weakness and declining mobility and ADL independence over the last month.  Currently patient needing more assist than PT eval, patient stating he recover well after exerting himself.  Patient needing Mod A for lower body ADL and CGA for seated upper body dressing.  Min A for basic mobility.  OT will continue efforts in the acute setting to address deficits, and patient wants to try for home with [facility] rehab.         If plan is discharge home, recommend the following: A lot of help with walking and/or transfers;A lot of help with bathing/dressing/bathroom;Assist for transportation;Assistance with cooking/housework    Functional Status Assessment  Patient has had a recent decline in their functional status and demonstrates the ability to make significant improvements in function in a reasonable and predictable amount of time.  Equipment Recommendations  None recommended by OT    Recommendations for Other Services       Precautions / Restrictions Precautions Precautions: Fall Restrictions Weight Bearing Restrictions: No      Mobility Bed Mobility Overal bed mobility: Needs Assistance Bed Mobility: Supine to Sit, Sit to Supine     Supine to sit: Mod assist Sit to supine: Mod assist        Transfers Overall transfer  level: Needs assistance Equipment used: Rolling walker (2 wheels) Transfers: Sit to/from Stand Sit to Stand: Contact guard assist, Min assist                  Balance Overall balance assessment: Needs assistance Sitting-balance support: Feet unsupported Sitting balance-Leahy Scale: Poor     Standing balance support: Bilateral upper extremity supported Standing balance-Leahy Scale: Poor                             ADL either performed or assessed with clinical judgement   ADL       Grooming: Wash/dry hands;Wash/dry face;Contact guard assist;Sitting           Upper Body Dressing : Contact guard assist;Sitting   Lower Body Dressing: Moderate assistance;Sit to/from stand   Toilet Transfer: Moderate assistance;Rolling walker (2 wheels);Regular Toilet;Ambulation                   Vision Patient Visual Report: No change from baseline       Perception Perception: Within Functional Limits       Praxis Praxis: WFL       Pertinent Vitals/Pain Pain Assessment Pain Assessment: No/denies pain     Extremity/Trunk Assessment Upper Extremity Assessment Upper Extremity Assessment: Generalized weakness;Right hand dominant RUE Deficits / Details: ROM limited about 15-20% though strength at least 3+/5 throughout RUE: Shoulder pain with ROM RUE Sensation: WNL RUE Coordination: WNL   Lower Extremity Assessment Lower Extremity  Assessment: Defer to PT evaluation   Cervical / Trunk Assessment Cervical / Trunk Assessment: Kyphotic   Communication Communication Communication: Hearing impairment   Cognition Arousal: Alert Behavior During Therapy: WFL for tasks assessed/performed Overall Cognitive Status: Within Functional Limits for tasks assessed Area of Impairment: Safety/judgement                         Safety/Judgement: Decreased awareness of deficits           General Comments  O2 sat 99% and HR to 104 with mobility.     Exercises     Shoulder Instructions      Home Living Family/patient expects to be discharged to:: Private residence Living Arrangements: Spouse/significant other Available Help at Discharge: Family;Available 24 hours/day Type of Home: House Home Access: Stairs to enter [employer] of Steps: 1   Home Layout: One level     Bathroom Shower/Tub: Producer, television/film/video: Handicapped height     Home Equipment: Cane - single point;Other (comment);Wheelchair - Biomedical scientist (2 wheels)   Additional Comments: wife (scheduled for watchman implant on 11/22) but normally helps for safety with mobility since pt has declined past several months      Prior Functioning/Environment Prior Level of Function : Needs assist             Mobility Comments: walks with cane and/or walking stick when going out; has had 3 falls in 6 months, one with R shoulder fx (non-displaced and healed per pt), and scraped front of his face; though reports still going to silver sneakers at least once per week and doing 20 minutes on treadmill, and 15-20 on seated elliptical and same on standing elliptical.  Does report limited standing activity at home to 10 minutes or less and wife assisting 90% of the time he is on his feet.  Plans to go into wheelchair when she has her surgery ADLs Comments: Past two weeks increasing assist for shower transfers, but able to complete ADL seated.        OT Problem List: Decreased strength;Decreased range of motion;Decreased activity tolerance;Impaired balance (sitting and/or standing);Decreased safety awareness      OT Treatment/Interventions: Self-care/ADL training;Therapeutic exercise;Patient/family education;Balance training;DME and/or AE instruction;Energy conservation    OT Goals(Current goals can be found in the care plan section) Acute Rehab OT Goals Patient Stated Goal: Hoping to return home today OT Goal Formulation: With  patient Time For Goal Achievement: 12/20/22 Potential to Achieve Goals: Fair ADL Goals Pt Will Perform Grooming: with supervision;standing Pt Will Perform Lower Body Dressing: sit to/from stand;with supervision Pt Will Transfer to Toilet: with supervision;ambulating;regular height toilet Pt/caregiver will Perform Home Exercise Program: Increased strength;Both right and left upper extremity;With theraband;With written HEP provided  OT Frequency: Min 1X/week    Co-evaluation              AM-PAC OT "6 Clicks" Daily Activity     Outcome Measure Help from another person eating meals?: None Help from another person taking care of personal grooming?: A Little Help from another person toileting, which includes using toliet, bedpan, or urinal?: A Lot Help from another person bathing (including washing, rinsing, drying)?: A Lot Help from another person to put on and taking off regular upper body clothing?: A Little Help from another person to put on and taking off regular lower body clothing?: A Lot 6 Click Score: 16   End of Session Equipment Utilized During Treatment: Rolling  walker (2 wheels);Gait belt Nurse Communication: Mobility status  Activity Tolerance: Patient tolerated treatment well Patient left: in bed;with call bell/phone within reach;with family/visitor present  OT Visit Diagnosis: Unsteadiness on feet (R26.81);Muscle weakness (generalized) (M62.81);History of falling (Z91.81)                Time: 1240-1305 OT Time Calculation (min): 25 min Charges:  OT General Charges $OT Visit: 1 Visit OT Evaluation $OT Eval Moderate Complexity: 1 Mod OT Treatments $Self Care/Home Management : 8-22 mins  12/06/2022  RP, OTR/L  Acute Rehabilitation Services  Office:  [phone]   Suzanna Obey 12/06/2022, 1:57 PM

## 2022-12-06 NOTE — ED Notes (Signed)
ED TO INPATIENT HANDOFF REPORT  ED Nurse Name and Phone #: Vernona Rieger [phone]  R Name/Age/Gender Roy [name] 77 y.o. male Room/Bed: 039C/039C  Code Status   Code Status: Full Code  Home/SNF/Other Home Patient oriented to: self, place, time, and situation Is this baseline? 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 Presents to the ED BIBA from home for c/o increased generalized weakness since March.     Allergies No Known Allergies  Level of Care/Admitting Diagnosis ED Disposition     ED Disposition  Admit   Condition  --   Comment  Hospital Area: [facility] [[id number]]  Level of Care: Telemetry Cardiac [[id number]]  May place patient in observation at [facility] or [facility] if equivalent level of care is available:: No  Covid Evaluation: Asymptomatic - no recent exposure (last 10 days) testing not required  Diagnosis: Frequent PVCs [604540]  Admitting Physician: Hillary Bow [[id number]]  Attending Physician: Hillary Bow [[id number]]          B Medical/Surgery History Past Medical History:  Diagnosis Date   Allergic rhinitis    Arthritis    wrists   Cardiomyopathy, nonischemic (HCC)    followed by cardiology--- dr Delton See---  2016 ef 45-50% ,  2016 nuclear ef 37%,  2017 per echo ef 50-55%   CHF (congestive heart failure), NYHA class III (HCC) 03/21/2020   Coronary artery disease    GERD (gastroesophageal reflux disease)    Hiatal hernia    History of kidney stones    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;   01/ 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   History of syncope (03-06-2020 pt stated has not had sycopal episode in few years, stated it seems to happen in extreme hot conditions)   cardiologist--- dr Eloy End--- dx recurrent syncope;  nuclear study 11-03-2014 intermediate risk w/ no ischemia, apical hypokinesis, nuclear ef 37%;  event monitor-- 12-21-2015  SB/ ST  no pauses/ arrythmia's;  echo 08-03-2015 ef 50-55%   Hyperlipidemia    Hypertension    followed by pcp   Hypovitaminosis D    Mass of left testicle    Nocturia    Plantar fasciitis    Presence of surgical incision    01/ 2022  moh's w/ skin graft of nose, per pt still healing and wear bandage daily   Type 2 diabetes mellitus (HCC)    pt is adament that he is not and have been told he is a diabetic but a borderline;  followed by pcp, in pcp note states DM2 and takes 2 meds daily   Wears glasses    Wears hearing aid in both ears    Weight loss 11/16/2020   Past Surgical History:  Procedure Laterality Date   COLONOSCOPY  last one 01-30-2017   CORONARY ARTERY BYPASS GRAFT N/A 03/25/2020   Procedure: CORONARY ARTERY BYPASS GRAFTING (CABG), ON PUMP, TIMES FOUR, USING BILATERAL INTERNAL MAMMARY ARTERIES AND LEFT RADIAL ARTERY;  Surgeon: Linden Dolin, MD;  Location: [facility] OR;  Service: Open Heart Surgery;  Laterality: N/A;   ICD IMPLANT N/A 08/23/2021   Procedure: ICD IMPLANT;  Surgeon: Marinus Maw, MD;  Location: [facility] INVASIVE CV LAB;  Service: Cardiovascular;  Laterality: N/A;   IR IMAGING GUIDED PORT INSERTION  07/06/2020   LOW ANTERIOR RESECTION RECTUM W/ COLOPROCTOSTOMY  05/2003   MOHS SURGERY  01/2020   nose w/ graft   ORCHIECTOMY Left 03/11/2020   Procedure: Clearance Coots;  Surgeon: Rene Paci, MD;  Location: [facility];  Service: Urology;  Laterality: Left;  ONLY NEEDS 60 MIN   PORTA CATH REMOVAL N/A 08/23/2021   Procedure: PORTA CATH REMOVAL;  Surgeon: Marinus Maw, MD;  Location: [facility] INVASIVE CV LAB;  Service: Cardiovascular;  Laterality: N/A;   RADIAL ARTERY HARVEST Left 03/25/2020   Procedure: LEFT RADIAL ARTERY HARVEST;  Surgeon: Linden Dolin, MD;  Location: [facility] OR;  Service: Open Heart Surgery;  Laterality: Left;   RIGHT/LEFT HEART CATH AND CORONARY ANGIOGRAPHY N/A 03/23/2020   Procedure: RIGHT/LEFT HEART CATH AND CORONARY  ANGIOGRAPHY;  Surgeon: Corky Crafts, MD;  Location: 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 Left 12/24/2008   left ear and nose    TEE WITHOUT CARDIOVERSION N/A 03/25/2020   Procedure: TRANSESOPHAGEAL ECHOCARDIOGRAM (TEE);  Surgeon: Linden Dolin, MD;  Location: [facility] OR;  Service: Open Heart Surgery;  Laterality: N/A;   TONSILLECTOMY AND ADENOIDECTOMY  child   UPPER GASTROINTESTINAL ENDOSCOPY  last one 06-06-2017     A IV Location/Drains/Wounds Patient Lines/Drains/Airways Status     Active Line/Drains/Airways     Name Placement date Placement time Site Days   Peripheral IV 12/05/22 20 G Right Antecubital 12/05/22  2135  Antecubital  1            Intake/Output Last 24 hours  Intake/Output Summary (Last 24 hours) at 12/06/2022 1250 Last data filed at 12/06/2022 0223 Gross per 24 hour  Intake 100 ml  Output --  Net 100 ml    Labs/Imaging Results for orders placed or performed during the hospital encounter of 12/05/22 (from the past 48 hour(s))  CBC with Differential/Platelet     Status: Abnormal   Collection Time: 12/05/22  9:34 PM  Result Value Ref Range   WBC 11.9 (H) 4.0 - 10.5 K/uL   RBC 3.53 (L) 4.22 - 5.81 MIL/uL   Hemoglobin 10.3 (L) 13.0 - 17.0 g/dL   HCT 41.3 (L) 24.4 - 01.0 %   MCV 93.8 80.0 - 100.0 fL   MCH 29.2 26.0 - 34.0 pg   MCHC 31.1 30.0 - 36.0 g/dL   RDW 27.2 53.6 - 64.4 %   Platelets 254 150 - 400 K/uL   nRBC 0.0 0.0 - 0.2 %   Neutrophils Relative % 84 %   Neutro Abs 10.1 (H) 1.7 - 7.7 K/uL   Lymphocytes Relative 8 %   Lymphs Abs 0.9 0.7 - 4.0 K/uL   Monocytes Relative 6 %   Monocytes Absolute 0.7 0.1 - 1.0 K/uL   Eosinophils Relative 1 %   Eosinophils Absolute 0.1 0.0 - 0.5 K/uL   Basophils Relative 0 %   Basophils Absolute 0.1 0.0 - 0.1 K/uL   Immature Granulocytes 1 %   Abs Immature Granulocytes 0.06 0.00 - 0.07  K/uL    Comment: Performed at [facility], [address] [place], [place] [address]  Comprehensive metabolic panel     Status: Abnormal   Collection Time: 12/05/22  9:34 PM  Result Value Ref Range   Sodium 141 135 - 145 mmol/L   Potassium 3.7 3.5 - 5.1 mmol/L   Chloride 113 (H) 98 - 111 mmol/L   CO2 18 (L) 22 - 32 mmol/L  Glucose, Bld 110 (H) 70 - 99 mg/dL    Comment: Glucose reference range applies only to samples taken after fasting for at least 8 hours.   BUN 13 8 - 23 mg/dL   Creatinine, Ser 2.95 (H) 0.61 - 1.24 mg/dL   Calcium 8.2 (L) 8.9 - 10.3 mg/dL   Total Protein 6.3 (L) 6.5 - 8.1 g/dL   Albumin 3.4 (L) 3.5 - 5.0 g/dL   AST 17 15 - 41 U/L   ALT 12 0 - 44 U/L   Alkaline Phosphatase 96 38 - 126 U/L   Total Bilirubin 0.7 <1.2 mg/dL   GFR, Estimated 55 (L) >60 mL/min    Comment: (NOTE) Calculated using the CKD-EPI Creatinine Equation (2021)    Anion gap 10 5 - 15    Comment: Performed at [facility], [address], [place], [place] [address]  Brain natriuretic peptide     Status: Abnormal   Collection Time: 12/05/22  9:34 PM  Result Value Ref Range   B Natriuretic Peptide 1,431.6 (H) 0.0 - 100.0 pg/mL    Comment: Performed at [facility], [address], [place], [place] [address]  Magnesium     Status: Abnormal   Collection Time: 12/05/22  9:34 PM  Result Value Ref Range   Magnesium 0.9 (LL) 1.7 - 2.4 mg/dL    Comment: CRITICAL RESULT CALLED TO, READ BACK BY AND VERIFIED WITH LILLI NORDELL RN @2215  12/05/22 SATRAINR Performed at [facility], [address], [place], [place] [address]   Urinalysis, w/ Reflex to Culture (Infection Suspected) -Urine, Clean Catch     Status: Abnormal   Collection Time: 12/06/22  1:25 AM  Result Value Ref Range   Specimen Source URINE, CLEAN CATCH    Color, Urine AMBER (A) YELLOW    Comment: BIOCHEMICALS MAY BE AFFECTED BY COLOR   APPearance HAZY (A) CLEAR   Specific Gravity, Urine 1.028 1.005 - 1.030    pH 5.0 5.0 - 8.0   Glucose, UA >=500 (A) NEGATIVE mg/dL   Hgb urine dipstick NEGATIVE NEGATIVE   Bilirubin Urine NEGATIVE NEGATIVE   Ketones, ur 5 (A) NEGATIVE mg/dL   Protein, ur NEGATIVE NEGATIVE mg/dL   Nitrite NEGATIVE NEGATIVE   Leukocytes,Ua NEGATIVE NEGATIVE   RBC / HPF 0-5 0 - 5 RBC/hpf   WBC, UA 6-10 0 - 5 WBC/hpf    Comment:        Reflex urine culture not performed if WBC <=10, OR if Squamous epithelial cells >5. If Squamous epithelial cells >5 suggest recollection.    Bacteria, UA FEW (A) NONE SEEN   Squamous Epithelial / HPF 0-5 0 - 5 /HPF    Comment: Performed at [facility], [address] [place], [place] [address]  CBC     Status: Abnormal   Collection Time: 12/06/22  5:06 AM  Result Value Ref Range   WBC 9.3 4.0 - 10.5 K/uL   RBC 3.35 (L) 4.22 - 5.81 MIL/uL   Hemoglobin 9.9 (L) 13.0 - 17.0 g/dL   HCT 13.2 (L) 44.0 - 10.2 %   MCV 93.1 80.0 - 100.0 fL   MCH 29.6 26.0 - 34.0 pg   MCHC 31.7 30.0 - 36.0 g/dL   RDW 72.5 36.6 - 44.0 %   Platelets 246 150 - 400 K/uL   nRBC 0.0 0.0 - 0.2 %    Comment: Performed at [facility], [address] [place], [place] [address]  Basic metabolic panel     Status: Abnormal   Collection Time: 12/06/22  5:06 AM  Result Value Ref Range   Sodium 141 135 - 145 mmol/L   Potassium 2.9 (L) 3.5 - 5.1 mmol/L   Chloride 113 (H) 98 - 111 mmol/L   CO2 17 (L) 22 - 32 mmol/L   Glucose, Bld 102 (H) 70 - 99 mg/dL    Comment: Glucose reference range applies only to samples taken after fasting for at least 8 hours.   BUN 12 8 - 23 mg/dL   Creatinine, Ser 1.61 (H) 0.61 - 1.24 mg/dL   Calcium 8.1 (L) 8.9 - 10.3 mg/dL   GFR, Estimated 51 (L) >60 mL/min    Comment: (NOTE) Calculated using the CKD-EPI Creatinine Equation (2021)    Anion gap 11 5 - 15    Comment: Performed at [facility], [address] [place], [place] [address]  Hemoglobin A1c     Status: Abnormal   Collection Time: 12/06/22  5:06 AM  Result Value  Ref Range   Hgb A1c MFr Bld 6.2 (H) 4.8 - 5.6 %    Comment: (NOTE) Pre diabetes:          5.7%-6.4%  Diabetes:              >6.4%  Glycemic control for   <7.0% adults with diabetes    Mean Plasma Glucose 131.24 mg/dL    Comment: Performed at [facility], [address] [place], [place] [address]  CBG monitoring, ED     Status: None   Collection Time: 12/06/22  7:55 AM  Result Value Ref Range   Glucose-Capillary 95 70 - 99 mg/dL    Comment: Glucose reference range applies only to samples taken after fasting for at least 8 hours.   Comment 1 Notify RN   CBG monitoring, ED     Status: Abnormal   Collection Time: 12/06/22 11:32 AM  Result Value Ref Range   Glucose-Capillary 197 (H) 70 - 99 mg/dL    Comment: Glucose reference range applies only to samples taken after fasting for at least 8 hours.   DG Chest Port 1 View  Result Date: 12/05/2022 CLINICAL DATA:  Shortness of breath EXAM: PORTABLE CHEST 1 VIEW COMPARISON:  Chest x-ray 09/02/2022 FINDINGS: Sternotomy wires, mediastinal clips and left-sided pacemaker are again seen. The heart size and mediastinal contours are within normal limits. Both lungs are clear. The visualized skeletal structures are unremarkable. IMPRESSION: No active disease. Electronically Signed   By: Darliss Cheney M.D.   On: 12/05/2022 23:55    Pending Labs Unresulted Labs (From admission, onward)     Start     Ordered   12/07/22 0500  Basic metabolic panel  Tomorrow morning,   R        12/06/22 0621            Vitals/Pain Today's Vitals   12/06/22 0840 12/06/22 0850 12/06/22 1030 12/06/22 1100  BP: (!) 138/102  131/76 126/79  Pulse: 74  88 88  Resp: 20  (!) 23 (!) 33  Temp:  98.6 F (37 C)    TempSrc:  Oral    SpO2: 100%  100% 100%  Weight:      Height:      PainSc:        Isolation Precautions No active isolations  Medications Medications  enoxaparin (LOVENOX) injection 40 mg (has no administration in time range)   acetaminophen (TYLENOL) tablet  650 mg (has no administration in time range)    Or  acetaminophen (TYLENOL) suppository 650 mg (has no administration in time range)  ondansetron (ZOFRAN) tablet 4 mg (has no administration in time range)    Or  ondansetron (ZOFRAN) injection 4 mg (has no administration in time range)  spironolactone (ALDACTONE) tablet 12.5 mg (12.5 mg Oral Given 12/06/22 1002)  metoprolol succinate (TOPROL-XL) 24 hr tablet 200 mg (200 mg Oral Given 12/06/22 1003)  pantoprazole (PROTONIX) EC tablet 40 mg (40 mg Oral Given 12/06/22 1004)  losartan (COZAAR) tablet 25 mg (25 mg Oral Given 12/06/22 1002)  empagliflozin (JARDIANCE) tablet 10 mg (10 mg Oral Given 12/06/22 0840)  atorvastatin (LIPITOR) tablet 40 mg (40 mg Oral Given 12/06/22 1003)  aspirin EC tablet 81 mg (81 mg Oral Given 12/06/22 1003)  insulin aspart (novoLOG) injection 0-9 Units ( Subcutaneous Not Given 12/06/22 0802)  insulin aspart (novoLOG) injection 0-5 Units (has no administration in time range)  magnesium oxide (MAG-OX) tablet 400 mg (400 mg Oral Given 12/06/22 1003)  potassium chloride SA (KLOR-CON M) CR tablet 40 mEq (40 mEq Oral Not Given 12/06/22 1004)  magnesium sulfate IVPB 2 g 50 mL (0 g Intravenous Stopped 12/06/22 0021)  magnesium sulfate IVPB 2 g 50 mL (0 g Intravenous Stopped 12/06/22 0223)    Mobility walks with device-walker     Focused Assessments Neuro Assessment Handoff:  Swallow screen pass? Yes  Cardiac Rhythm: Normal sinus rhythm       Neuro Assessment:   Neuro Checks:      Has TPA been given? No If patient is a Neuro Trauma and patient is going to OR before floor call report to 4N Charge nurse: [phone] or [phone]   R Recommendations: See Admitting Provider Note  Report given to:   Additional Notes: Continent. Wears hearing aids

## 2022-12-06 NOTE — ED Notes (Signed)
Representative from [employer] coming to interrogate pacemaker. [phone]

## 2022-12-06 NOTE — Evaluation (Signed)
Physical Therapy Evaluation Patient Details Name: Roy [name] MRN: [id number] DOB: 1945/08/31 Today's Date: 12/06/2022  History of Present Illness  Pt is a 77 y/o male seen in ED on 12/05/22 for weakness and slipping out of chair at home unable to stand up.  Noted to have on AICD severe spike in PVC and pt with hypomagnesemia. PMH - testicular CA s/p orchiecetomy, large B cell lymphoma, HTN, HLD, DM, CAD s/p CABG x 4 2022, CHF, cardiomyopathy with ICD implant 7/23, recurrent syncope.  Clinical Impression  Patient presents with decreased mobility due to generalized weakness, decreased balance, decreased safety awareness with three recent falls (two with injury) and high risk for falls at home.  Wife able to assist and pt reports she is with him when on his feet since gradual decline.  States he plans to use a wheelchair when she has a procedure on 22nd of this month.  Able to ambulate to bathroom with RW and CGA today noting L foot dragging and posterior bias at times.  Patient will benefit from skilled PT in the acute setting and follow up HHPT at d/c.         If plan is discharge home, recommend the following: A little help with walking and/or transfers;A little help with bathing/dressing/bathroom;Help with stairs or ramp for entrance;Assist for transportation;Assistance with cooking/housework   Can travel by private vehicle        Equipment Recommendations None recommended by PT  Recommendations for Other Services       Functional Status Assessment Patient has had a recent decline in their functional status and demonstrates the ability to make significant improvements in function in a reasonable and predictable amount of time.     Precautions / Restrictions Precautions Precautions: Fall      Mobility  Bed Mobility Overal bed mobility: Needs Assistance Bed Mobility: Supine to Sit, Sit to Supine     Supine to sit: Used rails, HOB elevated, Min assist Sit to supine: Min assist,  Used rails   General bed mobility comments: assist for lifting trunk; patient able to bring legs off EOB and eventually scoot hips    Transfers Overall transfer level: Needs assistance Equipment used: Rolling walker (2 wheels) Transfers: Sit to/from Stand Sit to Stand: Contact guard assist           General transfer comment: for balance    Ambulation/Gait Ambulation/Gait assistance: Contact guard assist Gait Distance (Feet): 90 Feet Assistive device: Rolling walker (2 wheels) Gait Pattern/deviations: Step-to pattern, Decreased dorsiflexion - left, Shuffle       General Gait Details: limited foot clearance bilaterally, but L worse than R; noted posterior LOB with turning in bathroom and A to prevent fall; also in room pt "parked" RW and needing cues to maintain RW while stepping to bed for safety  Stairs            Wheelchair Mobility     Tilt Bed    Modified Rankin (Stroke Patients Only)       Balance Overall balance assessment: Needs assistance Sitting-balance support: Feet unsupported Sitting balance-Leahy Scale: Poor Sitting balance - Comments: feet not on the floor initially needing cues and occasional CGA for prevention of posterior LOB Postural control: Posterior lean Standing balance support: Bilateral upper extremity supported Standing balance-Leahy Scale: Poor Standing balance comment: UE support for balance needing A for preventing LOB posterior at times; stood at toilet to urinate with close S to CGA for safety  Pertinent Vitals/Pain Pain Assessment Pain Assessment: No/denies pain    Home Living Family/patient expects to be discharged to:: Private residence Living Arrangements: Spouse/significant other Available Help at Discharge: Family;Available 24 hours/day Type of Home: House Home Access: Stairs to enter   [employer] of Steps: 1   Home Layout: One level Home Equipment: Cane - single  point;Other (comment);Wheelchair - Biomedical scientist (2 wheels) (walking stick) Additional Comments: wife (scheduled for watchman implant on 11/22) but normally helps for safety with mobility since pt has declined past several months    Prior Function Prior Level of Function : Needs assist             Mobility Comments: walks with cane and/or walking stick when going out; has had 3 falls in 6 months, one with R shoulder fx (non-displaced and healed per pt), and scraped front of his face; though reports still going to silver sneakers at least once per week and doing 20 minutes on treadmill, and 15-20 on seated elliptical and same on standing elliptical.  Does report limited standing activity at home to 10 minutes or less and wife assisting 90% of the time he is on his feet.  Plans to go into wheelchair when she has her surgery       Extremity/Trunk Assessment   Upper Extremity Assessment Upper Extremity Assessment: RUE deficits/detail RUE Deficits / Details: ROM limited about 15-20% though strength at least 3+/5 throughout RUE: Shoulder pain with ROM RUE Sensation: WNL    Lower Extremity Assessment Lower Extremity Assessment: Generalized weakness    Cervical / Trunk Assessment Cervical / Trunk Assessment: Kyphotic  Communication   Communication Communication: Hearing impairment  Cognition Arousal: Alert Behavior During Therapy: WFL for tasks assessed/performed Overall Cognitive Status: Within Functional Limits for tasks assessed                                          General Comments General comments (skin integrity, edema, etc.): Issued handout on fall prevention with notes to wife on staying beside pt while walking, keeping good footwear, lighting, clear pathways and to watch for posterior LOB.  Not on monitor for ambulation but pre BP sitting EOB 146/58; post vitals with 25 PVC's and HR 92, BP 132/89, RR 31, SpO2 100% on RA.    Exercises      Assessment/Plan    PT Assessment Patient needs continued PT services  PT Problem List Decreased strength;Decreased activity tolerance;Decreased balance;Decreased safety awareness;Decreased mobility;Decreased knowledge of use of DME       PT Treatment Interventions DME instruction;Gait training;Patient/family education;Functional mobility training;Therapeutic activities;Therapeutic exercise;Balance training    PT Goals (Current goals can be found in the Care Plan section)  Acute Rehab PT Goals Patient Stated Goal: return to independent, go to MD appt today at 3 pm PT Goal Formulation: With patient Time For Goal Achievement: 12/20/22 Potential to Achieve Goals: Fair    Frequency Min 1X/week     Co-evaluation               AM-PAC PT "6 Clicks" Mobility  Outcome Measure Help needed turning from your back to your side while in a flat bed without using bedrails?: A Little Help needed moving from lying on your back to sitting on the side of a flat bed without using bedrails?: A Little Help needed moving to and from a bed to a chair (including  a wheelchair)?: A Little Help needed standing up from a chair using your arms (e.g., wheelchair or bedside chair)?: A Little Help needed to walk in hospital room?: A Little Help needed climbing 3-5 steps with a railing? : Total 6 Click Score: 16    End of Session Equipment Utilized During Treatment: Gait belt Activity Tolerance: Patient limited by fatigue Patient left: in bed   PT Visit Diagnosis: Other abnormalities of gait and mobility (R26.89);History of falling (Z91.81);Muscle weakness (generalized) (M62.81)    Time: 8413-2440 PT Time Calculation (min) (ACUTE ONLY): 48 min   Charges:   PT Evaluation $PT Eval Moderate Complexity: 1 Mod PT Treatments $Gait Training: 8-22 mins $Self Care/Home Management: 8-22 PT General Charges $$ ACUTE PT VISIT: 1 Visit         Sheran Lawless, PT Acute Rehabilitation  Services Office:[phone] 12/06/2022   Elray Mcgregor 12/06/2022, 12:17 PM

## 2022-12-06 NOTE — Telephone Encounter (Signed)
Pt currently hospitalized.  No action needed.

## 2022-12-06 NOTE — Assessment & Plan Note (Signed)
Creat of 1.3 today is actually the best its looked in a long time (probably due to stopping entresto and going on losartan instead).

## 2022-12-06 NOTE — Assessment & Plan Note (Addendum)
Continue glucose cover and monitoring with insulin sliding scale.  Fasting glucose today is 169 mg/dl.   Continue with atorvastatin.

## 2022-12-06 NOTE — ED Notes (Signed)
NAD noted at this time, pt resting in bed, respirations even and unlabored, skin warm and dry, bed in low position, call light within reach. Comfort measures offered. SR with PVC noted, pt denies any needs at this time. Will continue to monitor.

## 2022-12-06 NOTE — Progress Notes (Signed)
[facility] PROGRESS NOTE    Progress Note  Roy [name]  VOZ:[id number] DOB: 1945-09-06 DOA: 12/05/2022 PCP: Irena Reichmann, DO     Brief Narrative:   Roy [name] is an 77 y.o. male past medical history significant for nonischemic cardiomyopathy with an EF of 25% by Plax and diastolic dysfunction right ventricular systolic function is mildly reduced, history of single-chamber AICD placement on 07/2021, CAD status post CABG on 3/22, primary testicular cancer status postchemotherapy (in remission) chronic kidney disease stage IIIb (with baseline creatinine 1.7-1.8) essential hypertension, controlled diabetes mellitus comes in with  worsening weakness who slipped today and was unable to get up, it is to note that the patient was changed from Entresto to losartan a couple of weeks due to cough likely due to [facility] since then AICD has noted severe spike in PVC burden.   Assessment/Plan:  Generalized weakness: There has been no uptake in frequent PVCs per AICD, despite 200 mg of Toprol XL, continue to monitor telemetry. He relates that ever since his Sherryll Burger was changed to losartan he has been getting more weak.  Will discuss with cardiology if this could cause his generalized weakness. Try to keep potassium greater than 4 magnesium greater than 2. Magnesium has been repleted IV as it was 0.6 labs this morning are pending. PT OT has been consulted.  Electrolyte imbalance hypomagnesemia/hypokalemia: Question can contribute to weakness repleted try to keep greater than 4. Start on magnesium orally twice a day for 24 hours.   Potassium is also low replete orally today, can restart his home dose of potassium on 12/08/2022.  It is to note he is on Aldactone at home which has been resumed keep a close eye on potassium.  Chronic systolic heart failure/nonischemic cardiomyopathy: The recent 2D echo 2 months ago, That showed an EF of 25%. Continue losartan, Aldactone, metoprolol and Demadex.   It is to note that on his last cardiology appointment he was changed to torsemide at a lower dose. His weight during that appointment was 97.4 kg, on the day of this visit is 87.1. BNP is 1500, this of gravity is 1028, chest x-ray showed no acute cardiopulmonary disease  Chronic kidney disease stage IIIa: His creatinine appears to be at baseline.  Diabetes mellitus type 2: Holding metformin continue Jardiance, his last A1c was 6.2. His GFR is greater than 40 mL/min, so great to be on metformin as an outpatient continue to hold today. Continue sliding scale insulin, blood glucose well-controlled.  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  DVT prophylaxis: lovenox Family Communication:none Status is: Observation The patient remains OBS appropriate and will d/c before 2 midnights.    Code Status:     Code Status Orders  (From admission, onward)           Start     Ordered   12/06/22 0356  Full code  Continuous       Question:  By:  Answer:  Default: patient does not have capacity for decision making, no surrogate or prior directive available   12/06/22 0357           Code Status History     Date Active Date Inactive Code Status Order ID Comments User Context   08/23/2021 1423 08/23/2021 2342 Full Code [id number]  Marinus Maw, MD Inpatient   03/23/2020 1012 04/02/2020 1620 Full Code [id number]  Corky Crafts, MD Inpatient         IV  Access:   Peripheral IV   Procedures and diagnostic studies:   DG Chest Port 1 View  Result Date: 12/05/2022 CLINICAL DATA:  Shortness of breath EXAM: PORTABLE CHEST 1 VIEW COMPARISON:  Chest x-ray 09/02/2022 FINDINGS: Sternotomy wires, mediastinal clips and left-sided pacemaker are again seen. The heart size and mediastinal contours are within normal limits. Both lungs are clear. The visualized skeletal structures are unremarkable. IMPRESSION: No active disease.  Electronically Signed   By: Darliss Cheney M.D.   On: 12/05/2022 23:55     Medical Consultants:   None.   Subjective:    Roy [name] he denies any chest pain shortness of breath or prodromal symptoms  Objective:    Vitals:   12/06/22 0300 12/06/22 0400 12/06/22 0500 12/06/22 0509  BP: 138/86 (!) 140/83 131/82   Pulse: 77 74 74   Resp: (!) 26 17 20    Temp:    98.4 F (36.9 C)  TempSrc:    Oral  SpO2: 100% 100% 100%   Weight:      Height:       SpO2: 100 %   Intake/Output Summary (Last 24 hours) at 12/06/2022 0615 Last data filed at 12/06/2022 0223 Gross per 24 hour  Intake 100 ml  Output --  Net 100 ml   Filed Weights   12/05/22 2052  Weight: 87.1 kg    Exam: General exam: In no acute distress. Respiratory system: Good air movement and clear to auscultation. Cardiovascular system: S1 & S2 heard, RRR. No JVD. Gastrointestinal system: Abdomen is nondistended, soft and nontender.  Extremities: No pedal edema. Skin: No rashes, lesions or ulcers Psychiatry: Judgement and insight appear normal. Mood & affect appropriate.    Data Reviewed:    Labs: Basic Metabolic Panel: Recent Labs  Lab 12/05/22 2134 12/06/22 0506  NA 141 141  K 3.7 2.9*  CL 113* 113*  CO2 18* 17*  GLUCOSE 110* 102*  BUN 13 12  CREATININE 1.31* 1.41*  CALCIUM 8.2* 8.1*  MG 0.9*  --    GFR Estimated Creatinine Clearance: 46.6 mL/min (A) (by C-G formula based on SCr of 1.41 mg/dL (H)). Liver Function Tests: Recent Labs  Lab 12/05/22 2134  AST 17  ALT 12  ALKPHOS 96  BILITOT 0.7  PROT 6.3*  ALBUMIN 3.4*   No results for input(s): "LIPASE", "AMYLASE" in the last 168 hours. No results for input(s): "AMMONIA" in the last 168 hours. Coagulation profile No results for input(s): "INR", "PROTIME" in the last 168 hours. COVID-19 Labs  No results for input(s): "DDIMER", "FERRITIN", "LDH", "CRP" in the last 72 hours.  Lab Results  Component Value Date   SARSCOV2NAA NEGATIVE  03/21/2020   SARSCOV2NAA NEGATIVE 03/07/2020    CBC: Recent Labs  Lab 12/05/22 2134 12/06/22 0506  WBC 11.9* 9.3  NEUTROABS 10.1*  --   HGB 10.3* 9.9*  HCT 33.1* 31.2*  MCV 93.8 93.1  PLT 254 246   Cardiac Enzymes: No results for input(s): "CKTOTAL", "CKMB", "CKMBINDEX", "TROPONINI" in the last 168 hours. BNP (last 3 results) Recent Labs    09/02/22 1000  PROBNP 609*   CBG: No results for input(s): "GLUCAP" in the last 168 hours. D-Dimer: No results for input(s): "DDIMER" in the last 72 hours. Hgb A1c: Recent Labs    12/06/22 0506  HGBA1C 6.2*   Lipid Profile: No results for input(s): "CHOL", "HDL", "LDLCALC", "TRIG", "CHOLHDL", "LDLDIRECT" in the last 72 hours. Thyroid function studies: No results for input(s): "TSH", "T4TOTAL", "T3FREE", "THYROIDAB"  in the last 72 hours.  Invalid input(s): "FREET3" Anemia work up: No results for input(s): "VITAMINB12", "FOLATE", "FERRITIN", "TIBC", "IRON", "RETICCTPCT" in the last 72 hours. Sepsis Labs: Recent Labs  Lab 12/05/22 2134 12/06/22 0506  WBC 11.9* 9.3   Microbiology No results found for this or any previous visit (from the past 240 hour(s)).   Medications:    aspirin EC  81 mg Oral Daily   atorvastatin  40 mg Oral Daily   empagliflozin  10 mg Oral QAC breakfast   enoxaparin (LOVENOX) injection  40 mg Subcutaneous Q24H   insulin aspart  0-5 Units Subcutaneous QHS   insulin aspart  0-9 Units Subcutaneous TID WC   losartan  25 mg Oral Daily   metoprolol  200 mg Oral Daily   pantoprazole  40 mg Oral Daily   [START ON 12/07/2022] potassium chloride SA  20 mEq Oral Q M,W,F   spironolactone  12.5 mg Oral Daily   tamsulosin  0.4 mg Oral QHS   [START ON 12/07/2022] torsemide  20 mg Oral Once per day on Monday Wednesday Friday   Continuous Infusions:    LOS: 0 days   Marinda Elk  [facility]  12/06/2022, 6:15 AM

## 2022-12-07 ENCOUNTER — Inpatient Hospital Stay (HOSPITAL_COMMUNITY): Payer: Medicare HMO

## 2022-12-07 DIAGNOSIS — R7989 Other specified abnormal findings of blood chemistry: Secondary | ICD-10-CM | POA: Diagnosis not present

## 2022-12-07 DIAGNOSIS — I5021 Acute systolic (congestive) heart failure: Secondary | ICD-10-CM

## 2022-12-07 DIAGNOSIS — N1832 Chronic kidney disease, stage 3b: Secondary | ICD-10-CM | POA: Diagnosis present

## 2022-12-07 DIAGNOSIS — G9389 Other specified disorders of brain: Secondary | ICD-10-CM | POA: Diagnosis not present

## 2022-12-07 DIAGNOSIS — I10 Essential (primary) hypertension: Secondary | ICD-10-CM | POA: Diagnosis not present

## 2022-12-07 DIAGNOSIS — I2510 Atherosclerotic heart disease of native coronary artery without angina pectoris: Secondary | ICD-10-CM | POA: Diagnosis present

## 2022-12-07 DIAGNOSIS — E1122 Type 2 diabetes mellitus with diabetic chronic kidney disease: Secondary | ICD-10-CM | POA: Diagnosis present

## 2022-12-07 DIAGNOSIS — R296 Repeated falls: Secondary | ICD-10-CM | POA: Diagnosis present

## 2022-12-07 DIAGNOSIS — G9341 Metabolic encephalopathy: Secondary | ICD-10-CM

## 2022-12-07 DIAGNOSIS — K219 Gastro-esophageal reflux disease without esophagitis: Secondary | ICD-10-CM | POA: Diagnosis present

## 2022-12-07 DIAGNOSIS — C8330 Diffuse large B-cell lymphoma, unspecified site: Secondary | ICD-10-CM | POA: Diagnosis not present

## 2022-12-07 DIAGNOSIS — Z510 Encounter for antineoplastic radiation therapy: Secondary | ICD-10-CM | POA: Diagnosis present

## 2022-12-07 DIAGNOSIS — E1165 Type 2 diabetes mellitus with hyperglycemia: Secondary | ICD-10-CM | POA: Diagnosis present

## 2022-12-07 DIAGNOSIS — C83398 Diffuse large b-cell lymphoma of other extranodal and solid organ sites: Secondary | ICD-10-CM | POA: Diagnosis present

## 2022-12-07 DIAGNOSIS — E1159 Type 2 diabetes mellitus with other circulatory complications: Secondary | ICD-10-CM | POA: Diagnosis present

## 2022-12-07 DIAGNOSIS — I130 Hypertensive heart and chronic kidney disease with heart failure and stage 1 through stage 4 chronic kidney disease, or unspecified chronic kidney disease: Secondary | ICD-10-CM | POA: Diagnosis present

## 2022-12-07 DIAGNOSIS — Z794 Long term (current) use of insulin: Secondary | ICD-10-CM | POA: Diagnosis not present

## 2022-12-07 DIAGNOSIS — E785 Hyperlipidemia, unspecified: Secondary | ICD-10-CM

## 2022-12-07 DIAGNOSIS — E872 Acidosis, unspecified: Secondary | ICD-10-CM | POA: Diagnosis present

## 2022-12-07 DIAGNOSIS — I5022 Chronic systolic (congestive) heart failure: Secondary | ICD-10-CM | POA: Diagnosis present

## 2022-12-07 DIAGNOSIS — T380X5A Adverse effect of glucocorticoids and synthetic analogues, initial encounter: Secondary | ICD-10-CM | POA: Diagnosis present

## 2022-12-07 DIAGNOSIS — E876 Hypokalemia: Secondary | ICD-10-CM | POA: Diagnosis present

## 2022-12-07 DIAGNOSIS — D630 Anemia in neoplastic disease: Secondary | ICD-10-CM | POA: Diagnosis present

## 2022-12-07 DIAGNOSIS — E1169 Type 2 diabetes mellitus with other specified complication: Secondary | ICD-10-CM | POA: Diagnosis present

## 2022-12-07 DIAGNOSIS — I428 Other cardiomyopathies: Secondary | ICD-10-CM | POA: Diagnosis present

## 2022-12-07 DIAGNOSIS — I493 Ventricular premature depolarization: Secondary | ICD-10-CM | POA: Diagnosis present

## 2022-12-07 DIAGNOSIS — I255 Ischemic cardiomyopathy: Secondary | ICD-10-CM | POA: Diagnosis present

## 2022-12-07 DIAGNOSIS — H9190 Unspecified hearing loss, unspecified ear: Secondary | ICD-10-CM | POA: Diagnosis present

## 2022-12-07 DIAGNOSIS — Z85828 Personal history of other malignant neoplasm of skin: Secondary | ICD-10-CM | POA: Diagnosis not present

## 2022-12-07 DIAGNOSIS — C8339 Primary central nervous system lymphoma: Secondary | ICD-10-CM | POA: Diagnosis not present

## 2022-12-07 LAB — BASIC METABOLIC PANEL
Anion gap: 10 (ref 5–15)
BUN: 17 mg/dL (ref 8–23)
CO2: 18 mmol/L — ABNORMAL LOW (ref 22–32)
Calcium: 8.4 mg/dL — ABNORMAL LOW (ref 8.9–10.3)
Chloride: 108 mmol/L (ref 98–111)
Creatinine, Ser: 1.75 mg/dL — ABNORMAL HIGH (ref 0.61–1.24)
GFR, Estimated: 39 mL/min — ABNORMAL LOW (ref >60)
Glucose, Bld: 129 mg/dL — ABNORMAL HIGH (ref 70–99)
Potassium: 3.7 mmol/L (ref 3.5–5.1)
Sodium: 136 mmol/L (ref 135–145)

## 2022-12-07 LAB — MAGNESIUM: Magnesium: 1.6 mg/dL — ABNORMAL LOW (ref 1.7–2.4)

## 2022-12-07 LAB — GLUCOSE, CAPILLARY
Glucose-Capillary: 128 mg/dL — ABNORMAL HIGH (ref 70–99)
Glucose-Capillary: 171 mg/dL — ABNORMAL HIGH (ref 70–99)
Glucose-Capillary: 392 mg/dL — ABNORMAL HIGH (ref 70–99)
Glucose-Capillary: 152 mg/dL — ABNORMAL HIGH (ref 70–99)
Glucose-Capillary: 138 mg/dL — ABNORMAL HIGH (ref 70–99)

## 2022-12-07 MED ORDER — FAMOTIDINE 20 MG PO TABS
40.0000 mg | ORAL_TABLET | Freq: Every day | ORAL | Status: DC
  Administered 2022-12-07 – 2022-12-27 (×21): 40 mg via ORAL
  Filled 2022-12-07 (×21): qty 2

## 2022-12-07 MED ORDER — MAGNESIUM OXIDE -MG SUPPLEMENT 400 (240 MG) MG PO TABS
400.0000 mg | ORAL_TABLET | Freq: Every day | ORAL | Status: DC
  Administered 2022-12-07: 400 mg via ORAL
  Filled 2022-12-07: qty 1

## 2022-12-07 MED ORDER — MAGNESIUM SULFATE 4 GM/100ML IV SOLN
4.0000 g | Freq: Once | INTRAVENOUS | Status: AC
  Administered 2022-12-07: 4 g via INTRAVENOUS
  Filled 2022-12-07: qty 100

## 2022-12-07 NOTE — Progress Notes (Signed)
PVCs are still seen, but are less frequent, probably due to correction of his electrolyte abnormalities. Creatinine has gone up a little bit. Magnesium is still a little low. The most likely etiology for hypomagnesemia is chronic PPI use. Would stop the omeprazole and use an H2blocker such as ranitidine or famotidine instead. [facility] will sign off.   Medication Recommendations:  stop omeprzole/pantoprazole. Start famotidine 40 mg daily Increase losartan to 50 mg daily Other recommendations (labs, testing, etc):  F/U BMET and Mg in a month Follow up as an outpatient:  Has appt scheduled on 12/10  with Dr. Ladona Ridgel for pacemaker f/u and then with Dr. Flora Lipps on 12/11.

## 2022-12-07 NOTE — Assessment & Plan Note (Addendum)
Brain MRI dominant contrast enhancing mass in the right frontal lobe measuring 3,9x2.1x4.7 cm with additional smaller contrast enhancing lesions in the right high frontal lobe and near the posterior limb of the internal capsule on the right.  Positive mass effect on the right lateral ventricular system.   CT chest abdomen and pelvis with no evidence of enlarged or progressive lymph nodes in the chest, abdomen or pelvis without contrast.  Subacute healing fractures of the left lateral 5th, 6th and 7th ribs and posterolateral left 8th and 9th ribs.   Acute metabolic encephalopathy, multifactorial clinically improved.   Patient neurologically stable. Started on dexamethasone  Plan for diagnostic LP and further radiation oncology evaluation.  Patient will be transferred to [facility] for further care.

## 2022-12-07 NOTE — Progress Notes (Addendum)
Progress Note   Patient: Roy [name]:[id number] DOB: 1945/07/22 DOA: 12/05/2022     0 DOS: the patient was seen and examined on 12/07/2022   Brief hospital course: Roy [name] was admitted to the hospital with the working diagnosis of hypomagnesemia.   77 yo male with the past medical history of heart failure, T2DM and hypertension who presented with generalized weakness. Patient had rapid decline in his overall health, had frequent falls and his wife noted him to be confused. Poor oral intake and increase somnolence. On the day of admission he slopped out of his chair and was note able to stand back up. As outpatient noted increased in PVC burden detected by his ICD. On his initial physical examination his blood pressure was 148/58, HR 63, RR 25 and 02 saturation 100%, awake and alert, deconditioned and ill looking appearing, lungs with no wheezing or rales, heart with S1 and S2 present, with extra beats, abdomen with no distention and positive lower extremity edema.   Na 137, K 3,7 Cl 113, bicarbonate 18, glucose 110 bun 13 cr 1,31  Mg 0,9  BNP 1,431  Wbc 11,9 hgb 10.3 plt 254   Urine analysis SG 1,028, glucose >500, protein negative, 6-10 wbc and 0-5 rbc. Negative leukocytes and negative hgb.   Chest radiograph with cardiomegaly with bilateral hilar vascular congestion with pacemaker defibrillator in place with one lead in the right atrium and one lead in the right ventricle.   Assessment and Plan: CKD stage 3b, GFR 30-44 ml/min (HCC) Hypokalemia. Hypomagnesemia.   Renal function with serum cr at 1,75 with K at 3,7 and serum bicarbonate at 18  Na 136 and Mg 1,6   Plan to continue Mg correction with 4 g mag sulfate.  Hold on diuretic therapy for now. Follow up renal function in am.   Acute metabolic encephalopathy Multifactorial. Continue electrolyte correction and consult nutrition for evaluation.  Noted to have focal dis coordination on the right side while working with  physical therapy, will do head CT ruled out ischemic event,.   CHF (congestive heart failure), NYHA class III (HCC) Frequent PVC due to electrolyte abnormalities.   Continue to correct K and Mg.  Continue telemetry monitoring. No clinical sings of exacerbation.   Continue metoprolol succinate, losatan, empagliflozin and spironolactone.  Hold on loop diuretic therapy for now.   Essential hypertension Continue blood pressure monitoring.   Type 2 diabetes mellitus with hyperlipidemia (HCC) Continue glucose cover and monitoring with insulin sliding scale.  Fasting glucose today is 129 mg/dl.   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    Physical Exam: Vitals:   12/07/22 0312 12/07/22 0417 12/07/22 0748 12/07/22 1104  BP: (!) 142/79  (!) 144/95 (!) 142/91  Pulse: 81  86 90  Resp: 15  20 (!) 25  Temp: 98.2 F (36.8 C)  97.6 F (36.4 C) (!) 97.4 F (36.3 C)  TempSrc: Oral  Oral Oral  SpO2: 94%  97% 98%  Weight:  90.7 kg    Height:       Neurology somnolent but easy to arouse, strength is preserved, he has no lack of attention or disorganized thinking ENT with mild pallor Cardiovascular with S1 and S2 present and regular with no gallops,  rubs or murmurs No JVD No lower extremity edema Respiratory with no rales or wheezing, no rhonchi Abdomen with no distention  Data Reviewed:    Family Communication: I spoke with patient's wife and brother in Social worker.  at the bedside, we talked in detail about patient's condition, plan of care and prognosis and all questions were addressed.   Disposition: Status is: Inpatient Remains inpatient appropriate because: hypomagnesemia   Planned Discharge Destination: Home     Author: Coralie Keens, MD 12/07/2022 4:30 PM  For on call review [email or web address].

## 2022-12-07 NOTE — Care Management Obs Status (Signed)
MEDICARE OBSERVATION STATUS NOTIFICATION   Patient Details  Name: SAMIN HADSELL MRN: [id number] Date of Birth: [birth year]   Medicare Observation Status Notification Given:  Yes    Harriet Masson, RN 12/07/2022, 11:56 AM

## 2022-12-07 NOTE — Hospital Course (Addendum)
Mr. Oberlander was admitted to the hospital with the working diagnosis of hypomagnesemia. Complicated with new diagnosis of brain mass, possible recurrence of lymphoma.    77 yo male with the past medical history of heart failure, T2DM and hypertension who presented with generalized weakness. Patient had rapid decline in his overall health, had frequent falls and his wife noted him to be confused. Poor oral intake and increase somnolence. On the day of admission he slopped out of his chair and was note able to stand back up. As outpatient noted increased in PVC burden detected by his ICD. On his initial physical examination his blood pressure was 148/58, HR 63, RR 25 and 02 saturation 100%, awake and alert, deconditioned and ill looking appearing, lungs with no wheezing or rales, heart with S1 and S2 present, with extra beats, abdomen with no distention and positive lower extremity edema.   Na 137, K 3,7 Cl 113, bicarbonate 18, glucose 110 bun 13 cr 1,31  Mg 0,9  BNP 1,431  Wbc 11,9 hgb 10.3 plt 254   Urine analysis SG 1,028, glucose >500, protein negative, 6-10 wbc and 0-5 rbc. Negative leukocytes and negative hgb.   Chest radiograph with cardiomegaly with bilateral hilar vascular congestion with pacemaker defibrillator in place with one lead in the right atrium and one lead in the right ventricle.   11/13 correcting electrolytes. Head Ct with right frontal mass.  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

## 2022-12-07 NOTE — Progress Notes (Signed)
Physical Therapy Treatment Patient Details Name: Roy [name] MRN: [id number] DOB: 03-May-1945 Today's Date: 12/07/2022  History of Present Illness  Pt is a 77 y/o male seen in ED on 12/05/22 for weakness and slipping out of chair at home unable to stand up.  Noted to have on AICD severe spike in PVC and pt with hypomagnesemia. PMH - testicular CA s/p orchiecetomy, large B cell lymphoma, HTN, HLD, DM, CAD s/p CABG x 4 2022, CHF, cardiomyopathy with ICD implant 7/23, recurrent syncope.   Clinical Impression  Patient resting in bed and asleep but easily awoke with auditory and tactile stimuli. Pt's spouse present at bedside. Pt has had gradual decline in mobility and isolated testing completed to Lt/Rt UE/LE. Pt noted to have increased weakness in Lt LE>UE and impaired coordination in Lt LE and UE. Pt was able to complete supine<>sit with Min-Mod assist today and is limited by weakness and Lt rib pain. Mod assist required for sit<>stand with HHA as PTA pt was using walking stick only for transfers and gait. Pt with flexed posture and ant/post leaning/sway. Lateral steps along EOB taken and pt required Mod assist to weight shift Rt and mod/max assist for Lt Le stepping due to weakness and dragging Lt foot. EOS pt requesting to return to supine to rest. Will continue to progress pt as able during acute stay.      If plan is discharge home, recommend the following: A lot of help with walking and/or transfers;A lot of help with bathing/dressing/bathroom;Assistance with cooking/housework;Assist for transportation;Help with stairs or ramp for entrance   Can travel by private vehicle   No    Equipment Recommendations None recommended by PT  Recommendations for Other Services       Functional Status Assessment Patient has had a recent decline in their functional status and demonstrates the ability to make significant improvements in function in a reasonable and predictable amount of time.      Precautions / Restrictions Precautions Precautions: Fall Restrictions Weight Bearing Restrictions: No      Mobility  Bed Mobility Overal bed mobility: Needs Assistance Bed Mobility: Supine to Sit, Sit to Supine     Supine to sit: HOB elevated, Used rails, Min assist, Mod assist Sit to supine: Min assist, HOB elevated, Used rails   General bed mobility comments: pt able to bring Le's off EOB, min-mod assist with use of bed pad to pivot hips and raise trunk upright, pt limited by Lt rib pain. pt able to bring Rt LE onto bed to return to supine but min assist required for Lt LE.    Transfers Overall transfer level: Needs assistance Equipment used: 1 person hand held assist Transfers: Sit to/from Stand Sit to Stand: Mod assist           General transfer comment: Mod assist for power up from EOB with cues for bil UE use. pt with flexed posture in standing, pt requesting to sit quickly after standing. compelted 2x from EOB.    Ambulation/Gait Ambulation/Gait assistance: Mod assist Gait Distance (Feet): 3 Feet Assistive device: 1 person hand held assist Gait Pattern/deviations: Step-to pattern, Decreased dorsiflexion - left, Shuffle Gait velocity: decr     General Gait Details: small lateral steps along EOB. HHA and mod assist to stabilize balance. Manual max assist to advance Lt LE as pt unabl to lift LE and clear foot to step towards Lt/HOB.  Stairs            Psychologist, prison and probation services  Tilt Bed    Modified Rankin (Stroke Patients Only)       Balance Overall balance assessment: Needs assistance Sitting-balance support: Feet unsupported Sitting balance-Leahy Scale: Fair Sitting balance - Comments: pt with poor awareness to center hips and align LE's to midline at EOB, slight Lt lean but corrected with manual assist to reposition Lt LE.                                     Pertinent Vitals/Pain Pain Assessment Pain Assessment: Faces Faces  Pain Scale: Hurts little more Pain Location: ribs on Lt side Pain Descriptors / Indicators: Discomfort Pain Intervention(s): Limited activity within patient's tolerance, Monitored during session, Repositioned    Home Living                          Prior Function Prior Level of Function : Needs assist             Mobility Comments: pt's spouse present and reporst in January he was independent walking with no AD. In teh Sprin he developed weakness and was more limited but still ambulating with no device. Pt has has 3 falls in the last at least 6 months and has face planted on 2 of them. Pt has been relying on walking stick for balance recently. ADLs Comments: wife has been assisting with ADLs over the last 2 weeks more with a gradual decline beginning in Sept/October.     Extremity/Trunk Assessment   Upper Extremity Assessment Upper Extremity Assessment: Right hand dominant;RUE deficits/detail;LUE deficits/detail RUE Coordination: WNL LUE Deficits / Details: impaird finger to nose with decreased speed and impaired trajectory LUE Coordination: decreased gross motor;decreased fine motor    Lower Extremity Assessment Lower Extremity Assessment: RLE deficits/detail;LLE deficits/detail RLE Deficits / Details: grossly 4/5 for hip flex, knee flex/ext, ankel Df/Pf RLE Sensation: WNL RLE Coordination: WNL LLE Deficits / Details: grossly 3-/5 for hip flex, 4-/5 for knee ext/flex, 2/5 for Df, 3+/5 for Pf LLE Sensation: WNL LLE Coordination: decreased fine motor;decreased gross motor    Cervical / Trunk Assessment Cervical / Trunk Assessment: Kyphotic  Communication   Communication Communication: Hearing impairment  Cognition Arousal: Lethargic Behavior During Therapy: WFL for tasks assessed/performed Overall Cognitive Status: Impaired/Different from baseline Area of Impairment: Orientation, Attention, Memory, Following commands, Safety/judgement, Awareness, Problem  solving                 Orientation Level: Disoriented to, Time, Situation, Place Current Attention Level: Selective Memory: Decreased short-term memory Following Commands: Follows one step commands consistently, Follows multi-step commands inconsistently Safety/Judgement: Decreased awareness of deficits Awareness: Emergent Problem Solving: Difficulty sequencing, Requires verbal cues, Decreased initiation General Comments: Pt with impaired cog overall: stated correct year; states Oct for month but when cued gets Nov; states 12th or 13th for date. states thursday for day of week. Able to give neam/DOB and even wedding anniversary. When asked where we are pt states we are in a facility that is associated with [facility] but not [facility]. When asked if he is sure he stated it is not [facility]. Pt able to give rough description of why we are here. Speech is slighlty mumbled; wife states that is not normal for him.        General Comments      Exercises     Assessment/Plan    PT Assessment Patient needs continued PT  services  PT Problem List Decreased strength;Decreased activity tolerance;Decreased balance;Decreased safety awareness;Decreased mobility;Decreased knowledge of use of DME       PT Treatment Interventions DME instruction;Gait training;Patient/family education;Functional mobility training;Therapeutic activities;Therapeutic exercise;Balance training    PT Goals (Current goals can be found in the Care Plan section)  Acute Rehab PT Goals Patient Stated Goal: regain mobility and balance, stop falling PT Goal Formulation: With patient/family Time For Goal Achievement: 12/20/22 Potential to Achieve Goals: Fair    Frequency Min 1X/week     Co-evaluation               AM-PAC PT "6 Clicks" Mobility  Outcome Measure Help needed turning from your back to your side while in a flat bed without using bedrails?: A Little Help needed moving from lying on your back to sitting on the side  of a flat bed without using bedrails?: A Little Help needed moving to and from a bed to a chair (including a wheelchair)?: A Lot Help needed standing up from a chair using your arms (e.g., wheelchair or bedside chair)?: A Lot Help needed to walk in hospital room?: A Lot Help needed climbing 3-5 steps with a railing? : Total 6 Click Score: 13    End of Session Equipment Utilized During Treatment: Gait belt Activity Tolerance: Patient limited by fatigue Patient left: in bed;with call bell/phone within reach;with bed alarm set;with family/visitor present (chair position partially) Nurse Communication: Mobility status PT Visit Diagnosis: Other abnormalities of gait and mobility (R26.89);History of falling (Z91.81);Muscle weakness (generalized) (M62.81)    Time: 1610-9604 PT Time Calculation (min) (ACUTE ONLY): 32 min   Charges:     PT Treatments $Therapeutic Activity: 23-37 mins PT General Charges $$ ACUTE PT VISIT: 1 Visit         Wynn Maudlin, DPT Acute Rehabilitation Services Office [phone]  12/07/22 2:13 PM

## 2022-12-07 NOTE — Assessment & Plan Note (Addendum)
Echocardiogram with reduced LV systolic function with EF 25 to 30%, global hypokinesis, RV systolic function with mild reduction, LA with moderate dilatation,   Telemetry with frequent PVC but no ventricular tachycardia.   No clinical signs of exacerbation.  Continue metoprolol succinate, losartan, and spironolactone.  Holding diuretic therapy and SGLT 2 inh, for now.

## 2022-12-07 NOTE — Assessment & Plan Note (Addendum)
Systolic blood pressure 144 to 131 mmHg.  Plan to continue metoprolol, losartan and spironolactone for blood pressure control.

## 2022-12-07 NOTE — TOC Initial Note (Signed)
Transition of Care [facility]) - Initial/Assessment Note    Patient Details  Name: Roy [name] MRN: [id number] Date of Birth: [birth year]  Transition [facility] Care [facility] CM/SW Contact:    Harriet Masson, RN Phone Number: 12/07/2022, 12:03 PM  Clinical Narrative:                  Spoke to wife who is in patient's room regarding transition needs.  Patient lives with wife and has walker, cane and walking stick. Patient has had multiple falls and wife has had to call fire department to help get patient up. A wheelchair will not work in their home. Ancil Boozer, wife, home health choice.  Jasmine December deferred to this RNCM to find highly rated agency.  Kandee Keen with [facility] accepted referral. Address, Phone number and PCP verified.  TOC will continue to follow for needs.  Expected Discharge Plan: Home w Home Health Services Barriers to Discharge: Continued Medical Work up   Patient Goals and CMS Choice Patient states their goals for this hospitalization and ongoing recovery are:: return home CMS Medicare.gov Compare Post Acute Care list provided to:: Patient Represenative (must comment) Choice offered to / list presented to : Spouse      Expected Discharge Plan and Services   Discharge Planning Services: CM Consult Post Acute Care Choice: Home Health Living arrangements for the past 2 months: Single Family Home                           HH Arranged: OT, PT HH Agency: [facility] Date [facility] Agency Contacted: 12/07/22 Time HH Agency Contacted: 1203 Representative spoke with at [facility] Agency: Kandee Keen  Prior Living Arrangements/Services Living arrangements for the past 2 months: Single Family Home Lives with:: Spouse Patient language and need for interpreter reviewed:: Yes Do you feel safe going back to the place where you live?: Yes      Need for Family Participation in Patient Care: Yes (Comment) Care giver support system in place?: Yes (comment) Current home services: DME  (walker, cane, walking stick) Criminal Activity/Legal Involvement Pertinent to Current Situation/Hospitalization: No - Comment as needed  Activities of Daily Living   ADL Screening (condition at time of admission) Independently performs ADLs?: Yes (appropriate for developmental age) Is the patient deaf or have difficulty hearing?: Yes Does the patient have difficulty seeing, even when wearing glasses/contacts?: No Does the patient have difficulty concentrating, remembering, or making decisions?: No  Permission Sought/Granted                  Emotional Assessment       Orientation: : Oriented to Self, Oriented to Place, Oriented to  Time, Oriented to Situation Alcohol / Substance Use: Not Applicable Psych Involvement: No (comment)  Admission diagnosis:  Hypomagnesemia [E83.42] Elevated brain natriuretic peptide (BNP) level [R79.89] Generalized weakness [R53.1] Frequent PVCs [I49.3] Patient Active Problem List   Diagnosis Date Noted   Frequent PVCs 12/06/2022   Hypomagnesemia 12/06/2022   CKD (chronic kidney disease) stage 3, GFR 30-59 ml/min (HCC) 12/06/2022   Generalized weakness 12/06/2022   ICD (implantable cardioverter-defibrillator) in place 11/30/2021   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 12/31/2020   Type 2 diabetes mellitus with other circulatory complications (HCC) 12/30/2020   Pure hypercholesterolemia 12/30/2020   Iron deficiency anemia 12/30/2020   Diastolic dysfunction 12/30/2020   Port-A-Cath in place 07/13/2020   Counseling regarding advance care planning and goals of care 06/26/2020  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   Presence of aortocoronary bypass graft 03/25/2020   Shortness of breath 03/21/2020   Acute pulmonary edema (HCC) 03/21/2020   Indigestion 03/21/2020   Troponin level elevated 03/21/2020   CHF (congestive heart failure), NYHA class III (HCC) 03/21/2020   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 [place]e [place] [address] Phone: [phone] Fax: [phone]     Social Determinants of Health (SDOH) Social History: SDOH Screenings   Food Insecurity: No Food Insecurity (12/06/2022)  Housing: Low Risk  (12/06/2022)  Transportation Needs: No Transportation Needs (12/06/2022)  Utilities: Not At Risk (12/06/2022)  Depression (PHQ2-9): Low Risk  (09/09/2020)  Tobacco Use: Medium Risk (12/05/2022)   SDOH Interventions:     Readmission Risk Interventions    04/01/2020   10:16 AM 03/30/2020   11:34 AM  Readmission Risk Prevention Plan  Transportation Screening  Complete  PCP or Specialist Appt within 3-5 Days Complete   HRI or Home Care Consult  Complete  Social Work Consult for Recovery Care Planning/Counseling  Complete  Palliative Care Screening  Not Applicable  Medication Review Oceanographer)  Complete

## 2022-12-07 NOTE — Progress Notes (Signed)
Mobility Specialist Progress Note:   12/07/22 0900  Mobility  Activity Ambulated with assistance in hallway  Level of Assistance Moderate assist, patient does 50-74%  Assistive Device Front wheel walker  Distance Ambulated (ft) 60 ft  Activity Response Tolerated fair  Mobility Referral Yes  $Mobility charge 1 Mobility  Mobility Specialist Start Time (ACUTE ONLY) 0857  Mobility Specialist Stop Time (ACUTE ONLY) 0914  Mobility Specialist Time Calculation (min) (ACUTE ONLY) 17 min    Pre Mobility: 89 HR,  98% SpO2 Post Mobility: 100% SpO2  Pt received in bed, agreeable to mobility. Very confused and weak this date. Required modA and max cues for bed mobility and ambulation. Had severe L lateral lean and required max cues to maintain upright posture. Returned to room w/o fault. Pt left in bed with call bell and NT present.  D'Vante Earlene Plater Mobility Specialist Please contact via Special educational needs teacher or Rehab office at [phone]

## 2022-12-08 ENCOUNTER — Inpatient Hospital Stay (HOSPITAL_COMMUNITY): Payer: Medicare HMO

## 2022-12-08 DIAGNOSIS — I5021 Acute systolic (congestive) heart failure: Secondary | ICD-10-CM | POA: Diagnosis not present

## 2022-12-08 DIAGNOSIS — I10 Essential (primary) hypertension: Secondary | ICD-10-CM | POA: Diagnosis not present

## 2022-12-08 DIAGNOSIS — N1832 Chronic kidney disease, stage 3b: Secondary | ICD-10-CM | POA: Diagnosis not present

## 2022-12-08 DIAGNOSIS — G9341 Metabolic encephalopathy: Secondary | ICD-10-CM | POA: Diagnosis not present

## 2022-12-08 LAB — GLUCOSE, CAPILLARY
Glucose-Capillary: 118 mg/dL — ABNORMAL HIGH (ref 70–99)
Glucose-Capillary: 187 mg/dL — ABNORMAL HIGH (ref 70–99)
Glucose-Capillary: 119 mg/dL — ABNORMAL HIGH (ref 70–99)
Glucose-Capillary: 149 mg/dL — ABNORMAL HIGH (ref 70–99)

## 2022-12-08 LAB — BASIC METABOLIC PANEL
Anion gap: 9 (ref 5–15)
BUN: 14 mg/dL (ref 8–23)
CO2: 19 mmol/L — ABNORMAL LOW (ref 22–32)
Calcium: 8.5 mg/dL — ABNORMAL LOW (ref 8.9–10.3)
Chloride: 107 mmol/L (ref 98–111)
Creatinine, Ser: 1.49 mg/dL — ABNORMAL HIGH (ref 0.61–1.24)
GFR, Estimated: 47 mL/min — ABNORMAL LOW (ref >60)
Glucose, Bld: 124 mg/dL — ABNORMAL HIGH (ref 70–99)
Potassium: 3.6 mmol/L (ref 3.5–5.1)
Sodium: 135 mmol/L (ref 135–145)

## 2022-12-08 LAB — MAGNESIUM: Magnesium: 2.3 mg/dL (ref 1.7–2.4)

## 2022-12-08 MED ORDER — POTASSIUM CHLORIDE CRYS ER 20 MEQ PO TBCR
40.0000 meq | EXTENDED_RELEASE_TABLET | Freq: Once | ORAL | Status: AC
  Administered 2022-12-08: 40 meq via ORAL
  Filled 2022-12-08: qty 2

## 2022-12-08 MED ORDER — GADOBUTROL 1 MMOL/ML IV SOLN
9.0000 mL | Freq: Once | INTRAVENOUS | Status: AC | PRN
  Administered 2022-12-08: 9 mL via INTRAVENOUS

## 2022-12-08 NOTE — Progress Notes (Signed)
OT Cancellation Note  Patient Details Name: Roy [name] MRN: [id number] DOB: 1945-05-06   Cancelled Treatment:    Reason Eval/Treat Not Completed: Patient at procedure or test/ unavailable  [name] D Carle Dargan 12/08/2022, 1:38 PM 12/08/2022  RP, OTR/L  Acute Rehabilitation Services  Office:  [phone]

## 2022-12-08 NOTE — Progress Notes (Addendum)
Progress Note   Patient: Roy [name] WGN:[id number] DOB: 11-24-1945 DOA: 12/05/2022     1 DOS: the patient was seen and examined on 12/08/2022   Brief hospital course: Roy [name] was admitted to the hospital with the working diagnosis of hypomagnesemia.   77 yo male with the past medical history of heart failure, T2DM and hypertension who presented with generalized weakness. Patient had rapid decline in his overall health, had frequent falls and his wife noted him to be confused. Poor oral intake and increase somnolence. On the day of admission he slopped out of his chair and was note able to stand back up. As outpatient noted increased in PVC burden detected by his ICD. On his initial physical examination his blood pressure was 148/58, HR 63, RR 25 and 02 saturation 100%, awake and alert, deconditioned and ill looking appearing, lungs with no wheezing or rales, heart with S1 and S2 present, with extra beats, abdomen with no distention and positive lower extremity edema.   Na 137, K 3,7 Cl 113, bicarbonate 18, glucose 110 bun 13 cr 1,31  Mg 0,9  BNP 1,431  Wbc 11,9 hgb 10.3 plt 254   Urine analysis SG 1,028, glucose >500, protein negative, 6-10 wbc and 0-5 rbc. Negative leukocytes and negative hgb.   Chest radiograph with cardiomegaly with bilateral hilar vascular congestion with pacemaker defibrillator in place with one lead in the right atrium and one lead in the right ventricle.   11/13 correcting electrolytes. Head Ct with right frontal mass.  11/24 plan for brain MRI.  Assessment and Plan: CKD stage 3b, GFR 30-44 ml/min (HCC) Hypokalemia. Hypomagnesemia.   Renal function today with serum cr at 1,49 with K at 3,6 and serum bicarbonate at 19. Na 135 and anion gap 9. Mg 2,3   Plan to add 40 meq Kcl today. Continue close follow up renal function and electrolytes.  Holding diuretic therapy.   Acute metabolic encephalopathy Multifactorial. Continue electrolyte correction and follow  up with nutrition evaluation.   Head CT with right frontal mass, positive mass effect.  Will plan to get brain MRI today. His neurologic exam is stable.   Chronic systolic CHF (congestive heart failure) (HCC) Echocardiogram with reduced LV systolic function with EF 25 to 30%, global hypokinesis, RV systolic function with mild reduction, LA with moderate dilatation,   Frequent PVC due to electrolyte abnormalities.  No clinical signs of exacerbation.  Continue metoprolol succinate, losartan, and spironolactone.  Hold on loop diuretic therapy for now.  Considering non anion gap metabolic acidosis, will hold on SLGT 2 inh for now.  Keep K at 4 and Mg at 2.   Essential hypertension Systolic blood pressure 129 to 146 mmHg.  Plan to continue metoprolol, losartan and spironolactone for blood pressure control.   Type 2 diabetes mellitus with hyperlipidemia (HCC) Continue glucose cover and monitoring with insulin sliding scale.  Fasting glucose today is 129 mg/dl.   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   Physical Exam: Vitals:   12/08/22 0018 12/08/22 0327 12/08/22 0744 12/08/22 0837  BP: 133/75 129/82 (!) 146/80 (!) 146/80  Pulse: 81 82 82 92  Resp: 18 20 (!) 25   Temp: 97.8 F (36.6 C) 97.7 F (36.5 C) 97.8 F (36.6 C)  TempSrc: Oral Oral Oral   SpO2: 96% 91% 96%   Weight:      Height:       Neurology somnolent but easy to arouse, strength is preserved upper and lower extremities.  ENT with mild pallor Cardiovascular with S1 and S2 present and regular with no gallops, rubs or murmurs Respiratory with no rales or wheezing, no rhonchi Abdomen with no distention  No lower extremity edema  Data Reviewed:    Family Communication: no family at the bedside    Disposition: Status is: Inpatient Remains inpatient appropriate because: neurologic workup   Planned Discharge Destination: Home      Author: Coralie Keens, MD 12/08/2022 10:19 AM  For on call review [email or web address].

## 2022-12-08 NOTE — Progress Notes (Signed)
Pt has ICD generator rep will program pt @1300  to do MRI scan nurse made aware pt will need monitoring.

## 2022-12-08 NOTE — Progress Notes (Signed)
Mobility Specialist Progress Note:   12/08/22 1600  Mobility  Activity Ambulated with assistance in hallway  Level of Assistance +2 (takes two people)  Press photographer wheel walker  Distance Ambulated (ft) 90 ft  Activity Response Tolerated well  Mobility Referral Yes  $Mobility charge 1 Mobility  Mobility Specialist Start Time (ACUTE ONLY) 1539  Mobility Specialist Stop Time (ACUTE ONLY) 1600  Mobility Specialist Time Calculation (min) (ACUTE ONLY) 21 min    Pre Mobility: 84 HR Post Mobility:  89 HR  Pt received in bed, agreeable to mobility.  Required modA+2 to stand from EOB and modA for ambulation. Pt still having severe L lateral lean and requiring cues to maintain upright posture. Took x1 seated break, then able to ambulate back to room w/o fault. No complaints throughout. Pt left in bed with call bell and all needs met. Bed alarm on and family present.  D'Vante Earlene Plater Mobility Specialist Please contact via Special educational needs teacher or Rehab office at [phone]

## 2022-12-08 NOTE — Progress Notes (Signed)
Physical Therapy Treatment Patient Details Name: Roy [name] MRN: [id number] DOB: [birth year] Today's Date: 12/08/2022   History of Present Illness Pt is a 77 y/o admitted 12/05/22 for weakness and slipping out of chair at home unable to stand up.  Noted on AICD severe spike in PVC and pt with hypomagnesemia. PMH - testicular CA s/p orchiecetomy, large B cell lymphoma, HTN, HLD, DM, CAD s/p CABG x 4 2022, CHF, cardiomyopathy s/p ICD, recurrent syncope.    PT Comments  Pt agreeable to mobility. Pt not oriented to place, reporting he was told he was going to the hospital later today for a CT scan. Pt modA STS from bed, CGA when in recliner with armrests.  Pt drags left foot and uses step to gait pattern, when fatiguing pt increases speed and shuffles while further leaning forward and pushing walker out in front, would not report fatigue until after he was told to stop due to walker being far ahead.    If plan is discharge home, recommend the following: A lot of help with walking and/or transfers;A lot of help with bathing/dressing/bathroom;Assistance with cooking/housework;Assist for transportation;Help with stairs or ramp for entrance   Can travel by private vehicle     Yes  Equipment Recommendations  None recommended by PT    Recommendations for Other Services       Precautions / Restrictions Precautions Precautions: Fall Restrictions Weight Bearing Restrictions: No     Mobility  Bed Mobility Overal bed mobility: Needs Assistance Bed Mobility: Supine to Sit     Supine to sit: HOB elevated, Used rails, Min assist     General bed mobility comments: Pt required minA to power up to sitting    Transfers Overall transfer level: Needs assistance Equipment used: Rolling walker (2 wheels) Transfers: Sit to/from Stand Sit to Stand: Mod assist           General transfer comment: Mod assist for power up from EOB, pt STS CGA from recliner with armrests 4x throughout session     Ambulation/Gait Ambulation/Gait assistance: Mod assist Gait Distance (Feet): 90 Feet Assistive device: Rolling walker (2 wheels) Gait Pattern/deviations: Step-to pattern, Decreased dorsiflexion - left, Shuffle   Gait velocity interpretation: <1.8 ft/sec, indicate of risk for recurrent falls   General Gait Details: pt drags left foot to reach rt LE, when fatiguing pt increases speed and shuffles while further leaning forwrd and pushing walker out in front. completed gait 272ft in 58ft-50ft-90ft bouts, pt had would not report fatigue until after he was told to stop due to walker being far ahead   Stairs             Wheelchair Mobility     Tilt Bed    Modified Rankin (Stroke Patients Only)       Balance Overall balance assessment: Needs assistance Sitting-balance support: Feet unsupported, Single extremity supported Sitting balance-Leahy Scale: Fair     Standing balance support: Bilateral upper extremity supported, During functional activity, Reliant on assistive device for balance Standing balance-Leahy Scale: Poor Standing balance comment: pt required use of RW to maintain upport during ambulation, had a leftward lean and required cueing and tactile facilitation to correct, would revert back to leftward lean                            Cognition Arousal: Alert Behavior During Therapy: WFL for tasks assessed/performed Overall Cognitive Status: Impaired/Different from baseline Area of Impairment: Orientation  Orientation Level: Place Current Attention Level: Selective Memory: Decreased short-term memory Following Commands: Follows one step commands consistently, Follows multi-step commands inconsistently Safety/Judgement: Decreased awareness of deficits, Decreased awareness of safety Awareness: Emergent Problem Solving: Difficulty sequencing, Requires verbal cues, Decreased initiation General Comments: Pt reported correct day of  week, month, and year, for date he pointed at the calander on the wall, correctly answered current president. Pt reported "the man" earlier said he was going to the hospital later for a CT scan        Exercises General Exercises - Lower Extremity Long Arc Quad: AROM, Both, 20 reps, Seated Hip Flexion/Marching: AROM, Both, 10 reps, Seated    General Comments        Pertinent Vitals/Pain Pain Assessment Pain Assessment: No/denies pain    Home Living                          Prior Function            PT Goals (current goals can now be found in the care plan section) Progress towards PT goals: Progressing toward goals    Frequency    Min 1X/week      PT Plan      Co-evaluation              AM-PAC PT "6 Clicks" Mobility   Outcome Measure  Help needed turning from your back to your side while in a flat bed without using bedrails?: A Little Help needed moving from lying on your back to sitting on the side of a flat bed without using bedrails?: A Little Help needed moving to and from a bed to a chair (including a wheelchair)?: A Lot Help needed standing up from a chair using your arms (e.g., wheelchair or bedside chair)?: A Lot Help needed to walk in hospital room?: A Lot Help needed climbing 3-5 steps with a railing? : Total 6 Click Score: 13    End of Session Equipment Utilized During Treatment: Gait belt Activity Tolerance: Patient tolerated treatment well Patient left: with nursing/sitter in room;Other (comment) (in restroom on [facility]) Nurse Communication: Mobility status PT Visit Diagnosis: Other abnormalities of gait and mobility (R26.89);History of falling (Z91.81);Muscle weakness (generalized) (M62.81)     Time: 6387-5643 PT Time Calculation (min) (ACUTE ONLY): 34 min  Charges:    $Gait Training: 8-22 mins $Therapeutic Activity: 8-22 mins PT General Charges $$ ACUTE PT VISIT: 1 Visit                     Andrey Farmer. SPT Secure chat  preferred    Darlin Drop 12/08/2022, 11:49 AM

## 2022-12-08 NOTE — Plan of Care (Signed)
  Problem: Nutritional: Goal: Maintenance of adequate nutrition will improve Outcome: Progressing Goal: Progress toward achieving an optimal weight will improve Outcome: Progressing   Problem: Skin Integrity: Goal: Risk for impaired skin integrity will decrease Outcome: Progressing   Problem: Nutrition: Goal: Adequate nutrition will be maintained Outcome: Progressing   Problem: Coping: Goal: Level of anxiety will decrease Outcome: Progressing

## 2022-12-09 DIAGNOSIS — G9389 Other specified disorders of brain: Secondary | ICD-10-CM | POA: Diagnosis not present

## 2022-12-09 DIAGNOSIS — C8330 Diffuse large B-cell lymphoma, unspecified site: Secondary | ICD-10-CM | POA: Diagnosis not present

## 2022-12-09 DIAGNOSIS — C83398 Diffuse large b-cell lymphoma of other extranodal and solid organ sites: Secondary | ICD-10-CM | POA: Diagnosis not present

## 2022-12-09 DIAGNOSIS — N1832 Chronic kidney disease, stage 3b: Secondary | ICD-10-CM | POA: Diagnosis not present

## 2022-12-09 DIAGNOSIS — C8339 Primary central nervous system lymphoma: Secondary | ICD-10-CM | POA: Diagnosis not present

## 2022-12-09 DIAGNOSIS — I5022 Chronic systolic (congestive) heart failure: Secondary | ICD-10-CM

## 2022-12-09 LAB — BASIC METABOLIC PANEL
Anion gap: 10 (ref 5–15)
BUN: 17 mg/dL (ref 8–23)
CO2: 18 mmol/L — ABNORMAL LOW (ref 22–32)
Calcium: 9.1 mg/dL (ref 8.9–10.3)
Chloride: 110 mmol/L (ref 98–111)
Creatinine, Ser: 1.32 mg/dL — ABNORMAL HIGH (ref 0.61–1.24)
GFR, Estimated: 55 mL/min — ABNORMAL LOW (ref >60)
Glucose, Bld: 134 mg/dL — ABNORMAL HIGH (ref 70–99)
Potassium: 4.1 mmol/L (ref 3.5–5.1)
Sodium: 138 mmol/L (ref 135–145)

## 2022-12-09 LAB — GLUCOSE, CAPILLARY
Glucose-Capillary: 118 mg/dL — ABNORMAL HIGH (ref 70–99)
Glucose-Capillary: 155 mg/dL — ABNORMAL HIGH (ref 70–99)
Glucose-Capillary: 121 mg/dL — ABNORMAL HIGH (ref 70–99)
Glucose-Capillary: 98 mg/dL (ref 70–99)

## 2022-12-09 MED ORDER — DEXAMETHASONE SODIUM PHOSPHATE 4 MG/ML IJ SOLN
4.0000 mg | Freq: Four times a day (QID) | INTRAMUSCULAR | Status: DC
Start: 2022-12-09 — End: 2022-12-13
  Administered 2022-12-09 – 2022-12-13 (×15): 4 mg via INTRAVENOUS
  Filled 2022-12-09 (×17): qty 1

## 2022-12-09 MED ORDER — ORAL CARE MOUTH RINSE: 15.0000 mL | OROMUCOSAL | Status: DC | PRN

## 2022-12-09 NOTE — Plan of Care (Signed)
  Problem: Coping: Goal: Ability to adjust to condition or change in health will improve Outcome: Progressing   Problem: Fluid Volume: Goal: Ability to maintain a balanced intake and output will improve Outcome: Progressing   Problem: Metabolic: Goal: Ability to maintain appropriate glucose levels will improve Outcome: Progressing   Problem: Nutritional: Goal: Maintenance of adequate nutrition will improve Outcome: Progressing   Problem: Skin Integrity: Goal: Risk for impaired skin integrity will decrease Outcome: Progressing   Problem: Tissue Perfusion: Goal: Adequacy of tissue perfusion will improve Outcome: Progressing   Problem: Clinical Measurements: Goal: Ability to maintain clinical measurements within normal limits will improve Outcome: Progressing

## 2022-12-09 NOTE — [place] FL2 (Signed)
[place] MEDICAID FL2 LEVEL OF CARE FORM     IDENTIFICATION  Patient Name: Roy [name] Birthdate: [birth year] Sex: male Admission Date (Current Location): 12/05/2022  [facility] and [place] Number:  Producer, television/film/video and Address:  [facility], [address] [place], [place] [address]      Provider Number: [id number]  Attending Physician Name and Address:  Coralie Keens,*  Relative Name and Phone Number:  Sherod, Paxton (Spouse)  [phone] (Mobile)    Current Level of Care: Hospital Recommended Level of Care: Skilled Nursing Facility Prior Approval Number:    Date Approved/Denied:   PASRR Number: [id number]  Discharge Plan: SNF    Current Diagnoses: Patient Active Problem List   Diagnosis Date Noted   Acute metabolic encephalopathy 12/07/2022   Frequent PVCs 12/06/2022   CKD stage 3b, GFR 30-44 ml/min (HCC) 12/06/2022   CKD (chronic kidney disease) stage 3, GFR 30-59 ml/min (HCC) 12/06/2022   Generalized weakness 12/06/2022   ICD (implantable cardioverter-defibrillator) in place 11/30/2021   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 12/31/2020   Type 2 diabetes mellitus with hyperlipidemia (HCC) 12/30/2020   Pure hypercholesterolemia 12/30/2020   Iron deficiency anemia 12/30/2020   Diastolic dysfunction 12/30/2020   Port-A-Cath in place 07/13/2020   Counseling regarding advance care planning and goals of care 06/26/2020   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   Presence of aortocoronary bypass graft 03/25/2020   Shortness of breath 03/21/2020   Acute pulmonary edema (HCC) 03/21/2020   Indigestion 03/21/2020   Troponin level elevated 03/21/2020   Chronic systolic CHF (congestive heart failure) (HCC) 03/21/2020   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 03/21/2020   Squamous cell skin cancer- nose s/p removal 03/21/2020   NSTEMI (non-ST elevated myocardial infarction) (HCC) 03/21/2020    Hyperlipidemia 01/30/2015   Nonischemic cardiomyopathy (HCC) 01/30/2015   Essential hypertension 01/30/2015   Faintness 01/30/2015   Chest pain 10/24/2014   Cardiomyopathy (HCC) 10/24/2014   Allergic rhinitis    Syncope    Nephrolithiasis    Plantar fasciitis    Hypovitaminosis D     Orientation RESPIRATION BLADDER Height & Weight     Self  Normal Incontinent Weight: 197 lb 8.5 oz (89.6 kg) Height:  6' (182.9 cm)  BEHAVIORAL SYMPTOMS/MOOD NEUROLOGICAL BOWEL NUTRITION STATUS      Incontinent Diet (see d/c summary)  AMBULATORY STATUS COMMUNICATION OF NEEDS Skin   Extensive Assist Verbally Other (Comment) (wound on nose)                       Personal Care Assistance Level of Assistance  Bathing, Feeding, Dressing Bathing Assistance: Limited assistance Feeding assistance: Independent Dressing Assistance: Limited assistance     Functional Limitations Info  Sight, Hearing, Speech Sight Info: Impaired Hearing Info: Impaired Speech Info: Adequate    SPECIAL CARE FACTORS FREQUENCY  OT (By licensed OT), PT (By licensed PT)     PT Frequency: 5x/week OT Frequency: 5x/week            Contractures Contractures Info: Not present    Additional Factors Info  Code Status, Allergies Code Status Info: full code Allergies Info: no known allergies           Current Medications (12/09/2022):  This is the current hospital active medication list Current Facility-Administered Medications  Medication Dose Route Frequency Provider Last Rate Last Admin   acetaminophen (TYLENOL) tablet 650 mg  650 mg Oral  Q6H PRN Hillary Bow, DO       Or   acetaminophen (TYLENOL) suppository 650 mg  650 mg Rectal Q6H PRN Hillary Bow, DO       aspirin EC tablet 81 mg  81 mg Oral Daily Lyda Perone M, DO   81 mg at 12/09/22 0831   atorvastatin (LIPITOR) tablet 40 mg  40 mg Oral Daily Lyda Perone M, DO   40 mg at 12/09/22 0831   enoxaparin (LOVENOX) injection 40 mg  40 mg  Subcutaneous Q24H Lyda Perone M, DO   40 mg at 12/08/22 1750   famotidine (PEPCID) tablet 40 mg  40 mg Oral Daily Croitoru, Mihai, MD   40 mg at 12/09/22 0831   feeding supplement (ENSURE ENLIVE / ENSURE PLUS) liquid 237 mL  237 mL Oral BID BM Marinda Elk, MD   237 mL at 12/09/22 1610   insulin aspart (novoLOG) injection 0-5 Units  0-5 Units Subcutaneous QHS Lyda Perone M, DO       insulin aspart (novoLOG) injection 0-9 Units  0-9 Units Subcutaneous TID WC Hillary Bow, DO   2 Units at 12/09/22 1143   losartan (COZAAR) tablet 50 mg  50 mg Oral Daily Croitoru, Mihai, MD   50 mg at 12/09/22 0831   metoprolol succinate (TOPROL-XL) 24 hr tablet 200 mg  200 mg Oral Daily Lyda Perone M, DO   200 mg at 12/09/22 9604   ondansetron (ZOFRAN) tablet 4 mg  4 mg Oral Q6H PRN Hillary Bow, DO       Or   ondansetron [facility]) injection 4 mg  4 mg Intravenous Q6H PRN Hillary Bow, DO       Oral care mouth rinse  15 mL Mouth Rinse PRN Arrien, York Ram, MD       spironolactone (ALDACTONE) tablet 12.5 mg  12.5 mg Oral Daily Lyda Perone M, DO   12.5 mg at 12/09/22 5409     Discharge Medications: Please see discharge summary for a list of discharge medications.  Relevant Imaging Results:  Relevant Lab Results:   Additional Information SSN [id number]  Erin Sons, LCSW

## 2022-12-09 NOTE — Progress Notes (Addendum)
Progress Note   Patient: Roy [name] MVH:[id number] DOB: 1945-05-16 DOA: 12/05/2022     2 DOS: the patient was seen and examined on 12/09/2022   Brief hospital course: Roy [name] was admitted to the hospital with the working diagnosis of hypomagnesemia. Complicated with new diagnosis of brain mass, possible recurrence of lymphoma.    77 yo male with the past medical history of heart failure, T2DM and hypertension who presented with generalized weakness. Patient had rapid decline in his overall health, had frequent falls and his wife noted him to be confused. Poor oral intake and increase somnolence. On the day of admission he slopped out of his chair and was note able to stand back up. As outpatient noted increased in PVC burden detected by his ICD. On his initial physical examination his blood pressure was 148/58, HR 63, RR 25 and 02 saturation 100%, awake and alert, deconditioned and ill looking appearing, lungs with no wheezing or rales, heart with S1 and S2 present, with extra beats, abdomen with no distention and positive lower extremity edema.   Na 137, K 3,7 Cl 113, bicarbonate 18, glucose 110 bun 13 cr 1,31  Mg 0,9  BNP 1,431  Wbc 11,9 hgb 10.3 plt 254   Urine analysis SG 1,028, glucose >500, protein negative, 6-10 wbc and 0-5 rbc. Negative leukocytes and negative hgb.   Chest radiograph with cardiomegaly with bilateral hilar vascular congestion with pacemaker defibrillator in place with one lead in the right atrium and one lead in the right ventricle.   11/13 correcting electrolytes. Head Ct with right frontal mass.  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   Assessment and Plan: CKD stage 3b, GFR 30-44 ml/min (HCC) Hypokalemia. Hypomagnesemia. Non anion gap metabolic acidosis.   Continue to improve serum cr down to 1,32 with K at 4,1 and serum bicarbonate at 18. Na 138   Patient is tolerating po well. Holding diuretic therapy and SGLT 2 inh.    Frontal mass of brain Brain MRI dominant contrast enhancing mass in the right frontal lobe measuring 3,9x2.1x4.7 cm with additional smaller contrast enhancing lesions in the right high frontal lobe and near the posterior limb of the internal capsule on the right.  Positive mass effect on the right lateral ventricular system.   Acute metabolic encephalopathy, multifactorial. Electrolyte disturbances and brain mass, possible recurrence of lymphoma.   Patient neurologically stable. I have contacted Dr Candise Che office for further recommendations.  May need CT chest, abdomen and pelvis.   Chronic systolic CHF (congestive heart failure) (HCC) Echocardiogram with reduced LV systolic function with EF 25 to 30%, global hypokinesis, RV systolic function with mild reduction, LA with moderate dilatation,   Telemetry with frequent PVC but no ventricular tachycardia.   No clinical signs of exacerbation.  Continue metoprolol succinate, losartan, and spironolactone.  Holding diuretic therapy and SGLT 2 inh, for now.   Essential hypertension Systolic blood pressure 144 to 131 mmHg.  Plan to continue metoprolol, losartan and spironolactone for blood pressure control.   Type 2 diabetes mellitus with hyperlipidemia (HCC) Continue glucose cover and monitoring with insulin sliding scale.  Fasting glucose today is 134 mg/dl.   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 Follow up with oncology recommendations.         Subjective: Patient with no chest pain or dyspnea, no headache, no nausea or vomiting.   Physical Exam: Vitals:  12/09/22 0506 12/09/22 0730 12/09/22 0832 12/09/22 1134  BP:  (!) 144/94 (!) 144/94 131/85  Pulse:   100 89  Resp:      Temp:  (!) 97.5 F (36.4 C)  (!) 97.5 F (36.4 C)  TempSrc:  Oral  Oral  SpO2:    99%  Weight: 89.6 kg     Height:       Neurology mild confusion and  disorientation with no agitation  ENT with mild pallor Cardiovascular with S1 and S2 present and regular with no gallops, rubs or murmurs Respiratory with no rales or wheezing, no rhonchi Abdomen with no distention  No lower extremity edema  Data Reviewed:    Family Communication: I spoke with patient's wife at the bedside, we talked in detail about patient's condition, plan of care and prognosis and all questions were addressed.   Disposition: Status is: Inpatient Remains inpatient appropriate because: pending final work up for brain mass   Planned Discharge Destination: Skilled nursing facility    Author: Coralie Keens, MD 12/09/2022 3:04 PM  For on call review [email or web address].

## 2022-12-09 NOTE — TOC Progression Note (Signed)
Transition of Care [facility]) - Progression Note    Patient Details  Name: Roy [name] MRN: [id number] Date of Birth: 29-Jan-1945  Transition of Care [facility]) CM/SW Contact  Erin Sons, [place] Phone Number: 12/09/2022, 3:37 PM  Clinical Narrative:     CSW called pt's spouse to discuss updated PT rec for SNF. She is agreeable to SNF w/u. Her first choice would be [facility]. Fl2 completed and bed requests sent in hub.   Expected Discharge Plan: Skilled Nursing Facility Barriers to Discharge: Continued Medical Work up  Expected Discharge Plan and Services   Discharge Planning Services: CM Consult Post Acute Care Choice: Home Health Living arrangements for the past 2 months: Single Family Home                           HH Arranged: OT, PT HH Agency: [facility] Date [facility] Agency Contacted: 12/07/22 Time HH Agency Contacted: 1203 Representative spoke with at [facility] Agency: Kandee Keen   Social Determinants of Health (SDOH) Interventions SDOH Screenings   Food Insecurity: No Food Insecurity (12/06/2022)  Housing: Low Risk  (12/06/2022)  Transportation Needs: No Transportation Needs (12/06/2022)  Utilities: Not At Risk (12/06/2022)  Depression (PHQ2-9): Low Risk  (09/09/2020)  Tobacco Use: Medium Risk (12/05/2022)    Readmission Risk Interventions    04/01/2020   10:16 AM 03/30/2020   11:34 AM  Readmission Risk Prevention Plan  Transportation Screening  Complete  PCP or Specialist Appt within 3-5 Days Complete   HRI or Home Care Consult  Complete  Social Work Consult for Recovery Care Planning/Counseling  Complete  Palliative Care Screening  Not Applicable  Medication Review [employer])  Complete

## 2022-12-09 NOTE — Progress Notes (Signed)
ONcology Short Note  Oncology consult  Patient with h/o  Left-sided primary testicular large B-cell lymphoma diagnosed 02/2020. S/p ipsilateral orchiectomy S/p 6 cycles of R-CEOP and 3 dose of IT MTX (for CNS prophylaxis) and R Tto contralateral testis. BCL-2, BCL 6, c-Myc negative. Patient was in remission until recently.  Admitted with recurrent falls and wife notes confusion and possibly from left sides weakness/neglect. MRI Brain shows Dominant contrast-enhancing mass in the right frontal lobe measuring 3.9 x 2.1 x 4.7 cm with additional smaller contrast-enhancing lesions in the high right frontal lobe and near the posterior limb of the internal capsule on the right. 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  I discussed the concerns for CNS relapse of primary testicular large B cell lymphoma and concering prognosis in this context. Plan -initiated goals of care discussion. -patient and wife understand that with his multiple significant medical comorbids and age that systemic rx of lypmhoma would be quite limited. -CT CAP w/o contrast ordered to evaluation to evaluate for systemic lymphoma recurrence. -would start Dexamethasone 4,g iv q6h to address brain lesions with edema causing midline shift. -will need diagnostic LP with cell counts, glucose, protein, cytology and flow cytometry, cultures -if CNS relapse confirmed will need transfer to [facility] hospital for consideration of IT +ARA-C. -patient would not be a good candidate for high dose IV methotrexate -might need to consider palliative RT -mx of steroids related hyperglycemia per hospital medicine. -full consult to follow. -appreciate hospital medicine cares  Wyvonnia Lora MD   ADDENDUM  CT CAP reviewed-- no overt recurrence /progression outside the brain that was noted. Discussed with Dr Ella Jubilee and Dr Kathrynn Running-- patient not a good candidate for IV HD Methotrexate-- would recommend continued IV Dexamethasone and consideration of  WBRT. Planning on [facility] -will later need spinal imaging and consideration of IT TCx vs palliative IV CTX+ Rituxan. -ongoing goals of care discussion.  .The total time spent in the appointment was 50 minutes* .  All of the patient's questions were answered with apparent satisfaction. The patient knows to call the clinic with any problems, questions or concerns.   Wyvonnia Lora MD MS AAHIVMS [facility] Hematology/Oncology Physician [facility]  .*Total Encounter Time as defined by the Centers for Medicare and Medicaid Services includes, in addition to the face-to-face time of a patient visit (documented in the note above) non-face-to-face time: obtaining and reviewing outside history, ordering and reviewing medications, tests or procedures, care coordination (communications with other health care professionals or caregivers) and documentation in the medical record.

## 2022-12-09 NOTE — Progress Notes (Signed)
Mobility Specialist Progress Note:   12/09/22 1000  Mobility  Activity Ambulated with assistance in hallway  Level of Assistance +2 (takes two people) (minA + chair follow)  Assistive Device Front wheel walker  Distance Ambulated (ft) 90 ft (45 x2)  Activity Response Tolerated well  Mobility Referral Yes  $Mobility charge 1 Mobility  Mobility Specialist Start Time (ACUTE ONLY) 1001  Mobility Specialist Stop Time (ACUTE ONLY) 1026  Mobility Specialist Time Calculation (min) (ACUTE ONLY) 25 min    Pre Mobility: 87 HR During Mobility: 127 HR Post Mobility:  89 HR  Pt received in chair, agreeable to mobility. MinA to stand from chair, and pt having much better upright posture this date. NT assisted in chair follow. Took x1 seated rest break. Asymptomatic w/ no complaints throughout. Pt wheeled back to room. Left in chair with call bell and all needs met. Chair alarm on.  Roy [name] Mobility Specialist Please contact via Special educational needs teacher or Rehab office at [phone]

## 2022-12-09 NOTE — Progress Notes (Signed)
Occupational Therapy Treatment Patient Details Name: Roy [name] MRN: [id number] DOB: 1945-07-03 Today's Date: 12/09/2022   History of present illness Pt is a 77 y/o admitted 12/05/22 for weakness and slipping out of chair at home unable to stand up.  Noted on AICD severe spike in PVC and pt with hypomagnesemia. 11/13 head CT with Rt frontal lobe mass. PMH - testicular CA s/p orchiecetomy, large B cell lymphoma, HTN, HLD, DM, CAD s/p CABG x 4 2022, CHF, cardiomyopathy s/p ICD, recurrent syncope.   OT comments  Patient with fair progress toward patient focused goals.  Patient's overall mobility is improving, generalized CGA to Mod A at RW level, and Mod A for lower body ADL.  Patient will benefit from continued inpatient follow up therapy, <3 hours/day, and OT will continue efforts in the acute setting.        If plan is discharge home, recommend the following:  A lot of help with walking and/or transfers;A lot of help with bathing/dressing/bathroom;Assist for transportation;Assistance with cooking/housework   Equipment Recommendations  None recommended by OT    Recommendations for Other Services      Precautions / Restrictions Precautions Precautions: Fall Restrictions Weight Bearing Restrictions: No       Mobility Bed Mobility Overal bed mobility: Needs Assistance Bed Mobility: Supine to Sit     Supine to sit: Mod assist          Transfers Overall transfer level: Needs assistance Equipment used: Rolling walker (2 wheels) Transfers: Sit to/from Stand, Bed to chair/wheelchair/BSC Sit to Stand: Min assist, Mod assist     Step pivot transfers: Min assist, Contact guard assist           Balance Overall balance assessment: Needs assistance Sitting-balance support: Single extremity supported, Feet supported Sitting balance-Leahy Scale: Fair     Standing balance support: Reliant on assistive device for balance Standing balance-Leahy Scale: Poor                              ADL either performed or assessed with clinical judgement   ADL       Grooming: Wash/dry hands;Wash/dry face;Sitting;Supervision/safety           Upper Body Dressing : Contact guard assist;Minimal assistance;Sitting   Lower Body Dressing: Moderate assistance;Sit to/from stand   Toilet Transfer: Minimal assistance;Moderate assistance;Stand-pivot;Ambulation;Regular Toilet                  Extremity/Trunk Assessment Upper Extremity Assessment Upper Extremity Assessment: Generalized weakness   Lower Extremity Assessment Lower Extremity Assessment: Defer to PT evaluation        Vision Baseline Vision/History: 1 Wears glasses Patient Visual Report: No change from baseline     Perception Perception Perception: Not tested   Praxis Praxis Praxis: Not tested    Cognition Arousal: Alert Behavior During Therapy: WFL for tasks assessed/performed Overall Cognitive Status: Within Functional Limits for tasks assessed                       Memory: Decreased short-term memory       Problem Solving: Decreased initiation                             Pertinent Vitals/ Pain       Pain Assessment Pain Assessment: No/denies pain Pain Intervention(s): Monitored during session  Frequency  Min 1X/week        Progress Toward Goals  OT Goals(current goals can now be found in the care plan section)  Progress towards OT goals: Progressing toward goals  Acute Rehab OT Goals OT Goal Formulation: With patient Time For Goal Achievement: 12/20/22 Potential to Achieve Goals: Fair  Plan      Co-evaluation                 AM-PAC OT "6 Clicks" Daily Activity     Outcome Measure   Help from another person eating meals?: A Little Help from another person taking care of personal grooming?: A Little Help from another person toileting, which  includes using toliet, bedpan, or urinal?: A Lot Help from another person bathing (including washing, rinsing, drying)?: A Lot Help from another person to put on and taking off regular upper body clothing?: A Little Help from another person to put on and taking off regular lower body clothing?: A Lot 6 Click Score: 15    End of Session Equipment Utilized During Treatment: Rolling walker (2 wheels);Gait belt  OT Visit Diagnosis: Unsteadiness on feet (R26.81);Muscle weakness (generalized) (M62.81);History of falling (Z91.81)   Activity Tolerance Patient tolerated treatment well   Patient Left in chair;with call bell/phone within reach;with chair alarm set   Nurse Communication Mobility status        Time: 2956-2130 OT Time Calculation (min): 26 min  Charges: OT General Charges $OT Visit: 1 Visit OT Treatments $Self Care/Home Management : 23-37 mins  12/09/2022  RP, OTR/L  Acute Rehabilitation Services  Office:  [phone]   Suzanna Obey 12/09/2022, 9:02 AM

## 2022-12-10 ENCOUNTER — Inpatient Hospital Stay (HOSPITAL_COMMUNITY): Payer: Medicare HMO

## 2022-12-10 DIAGNOSIS — I10 Essential (primary) hypertension: Secondary | ICD-10-CM | POA: Diagnosis not present

## 2022-12-10 DIAGNOSIS — I5022 Chronic systolic (congestive) heart failure: Secondary | ICD-10-CM | POA: Diagnosis not present

## 2022-12-10 DIAGNOSIS — G9389 Other specified disorders of brain: Secondary | ICD-10-CM | POA: Diagnosis not present

## 2022-12-10 DIAGNOSIS — N1832 Chronic kidney disease, stage 3b: Secondary | ICD-10-CM | POA: Diagnosis not present

## 2022-12-10 LAB — GLUCOSE, CAPILLARY
Glucose-Capillary: 169 mg/dL — ABNORMAL HIGH (ref 70–99)
Glucose-Capillary: 151 mg/dL — ABNORMAL HIGH (ref 70–99)
Glucose-Capillary: 142 mg/dL — ABNORMAL HIGH (ref 70–99)
Glucose-Capillary: 226 mg/dL — ABNORMAL HIGH (ref 70–99)

## 2022-12-10 NOTE — Progress Notes (Signed)
Progress Note   Patient: Roy [name] BJY:[id number] DOB: [birth year] DOA: 12/05/2022     3 DOS: the patient was seen and examined on 12/10/2022   Brief hospital course: Roy [name] was admitted to the hospital with the working diagnosis of hypomagnesemia. 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    77 yo male with the past medical history of heart failure, T2DM and hypertension who presented with generalized weakness. Patient had rapid decline in his overall health, had frequent falls and his wife noted him to be confused. Poor oral intake and increase somnolence. On the day of admission he slipped out of his chair and was note able to stand back up. As outpatient noted increased in ventricular ectopy (premature ventricular complex) burden which was detected by his ICD.  On his initial physical examination his blood pressure was 148/58, HR 63, RR 25 and 02 saturation 100%, awake and alert, deconditioned and ill looking appearing, lungs with no wheezing or rales, heart with S1 and S2 present, with extra beats, abdomen with no distention and positive lower extremity edema.   Na 137, K 3,7 Cl 113, bicarbonate 18, glucose 110 bun 13 cr 1,31  Mg 0,9  BNP 1,431  Wbc 11,9 hgb 10.3 plt 254   Urine analysis SG 1,028, glucose >500, protein negative, 6-10 wbc and 0-5 rbc. Negative leukocytes and negative hgb.   Chest radiograph with cardiomegaly with bilateral hilar vascular congestion with pacemaker defibrillator in place with one lead in the right atrium and one lead in the right ventricle.   11/13 correcting electrolytes. Head Ct with right frontal mass.  11/14 MRI with mass right frontal lobe suspicious for CNS lymphoma.  11/15 CT chest, abdomen and pelvis with no enlarged or progressive lymph nodes.  Plan to transfer to [facility] for further cancer care, radiation oncology consultation and IR LP on Monday.    Assessment and Plan: CKD stage 3b, GFR 30-44 ml/min  (HCC) Hypokalemia. Hypomagnesemia. Non anion gap metabolic acidosis.   Renal function has been improving and electrolytes have been corrected.  Patient is tolerating po well.  Holding diuretic therapy including SGLT 2 inh.  Check renal function and electrolytes in am.   Frontal mass of brain Brain MRI dominant contrast enhancing mass in the right frontal lobe measuring 3,9x2.1x4.7 cm with additional smaller contrast enhancing lesions in the right high frontal lobe and near the posterior limb of the internal capsule on the right.  Positive mass effect on the right lateral ventricular system.   CT chest abdomen and pelvis with no evidence of enlarged or progressive lymph nodes in the chest, abdomen or pelvis without contrast.  Subacute healing fractures of the left lateral 5th, 6th and 7th ribs and posterolateral left 8th and 9th ribs.   Acute metabolic encephalopathy, multifactorial clinically improved.   Patient neurologically stable. Started on dexamethasone  Plan for diagnostic LP and further radiation oncology evaluation.  Patient will be transferred to [facility] for further care.   Chronic systolic CHF (congestive heart failure) (HCC) Echocardiogram with reduced LV systolic function with EF 25 to 30%, global hypokinesis, RV systolic function with mild reduction, LA with moderate dilatation,   Now off telemetry,.   No clinical signs of exacerbation.  Continue metoprolol succinate, losartan, and spironolactone.  Holding diuretic therapy and SGLT 2 inh, for now.   Essential hypertension Systolic blood pressure 140 mmHg.  Plan to continue metoprolol, losartan and spironolactone for blood pressure control.   Type 2 diabetes mellitus  with hyperlipidemia (HCC) Continue glucose cover and monitoring with insulin sliding scale.  Fasting glucose today is 169 mg/dl.   Continue with atorvastatin.   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         Subjective: Patient is feeling well, continue very weak and deconditioned. Tolerating po well, with no nausea or vomiting.   Physical Exam: Vitals:   12/10/22 0327 12/10/22 0747 12/10/22 0926 12/10/22 1123  BP: (!) 145/88 (!) 132/94 (!) 143/87 (!) 137/90  Pulse: 85 93 87 82  Resp: (!) 21 (!) 23  19  Temp: 98 F (36.7 C)     TempSrc: Oral     SpO2: 98% 98%  100%  Weight:      Height:       Neurology awake and alert ENT with mild pallor Cardiovascular with S1 and S2 present and regular with no gallops, rubs or murmurs Respiratory with no rales or wheezing, no rhonchi Abdomen with no distention  No lower extremity edema  Data Reviewed:    Family Communication: no family at the bedside. I spoke with patient's wife over the phone, we talked in detail about patient's condition, plan of care and prognosis and all questions were addressed.   Disposition: Status is: Inpatient Remains inpatient appropriate because: transfer to [facility] for further cancer care   Planned Discharge Destination: Skilled nursing facility   Author: Coralie Keens, MD 12/10/2022 12:44 PM  For on call review [email or web address].

## 2022-12-10 NOTE — Plan of Care (Signed)
  Problem: Education: Goal: Ability to describe self-care measures that may prevent or decrease complications (Diabetes Survival Skills Education) will improve Outcome: Progressing Goal: Individualized Educational Video(s) Outcome: Progressing   

## 2022-12-10 NOTE — Progress Notes (Signed)
Report called to [name]E RN Dorathy Daft. [facility] called for transport.

## 2022-12-10 NOTE — TOC Progression Note (Addendum)
Transition of Care [facility]) - Progression Note    Patient Details  Name: Roy [name] MRN: [id number] Date of Birth: May 27, 1945  Transition of [facility] CM/SW Contact  Carley Hammed, LCSW Phone Number: 12/10/2022, 10:14 AM  Clinical Narrative:    CSW called spouse to provide bed offers for SNF. VM not set up, unable to leave a message. CSW to attempt again as able. TOC will continue to follow.   10:20 CSW advised by spouse that they have decided not to pursue SNF at this time. Pt with a new diagnosis and plan is for pt to transfer to [facility] for treatment. TOC will continue to be available.  Expected Discharge Plan: Skilled Nursing Facility Barriers to Discharge: Continued Medical Work up  Expected Discharge Plan and Services   Discharge Planning Services: CM Consult Post Acute Care Choice: Home Health Living arrangements for the past 2 months: Single Family Home                           HH Arranged: OT, PT HH Agency: [facility] Date [employer] Agency Contacted: 12/07/22 Time HH Agency Contacted: 1203 Representative spoke with at [facility] Agency: Kandee Keen   Social Determinants of Health (SDOH) Interventions SDOH Screenings   Food Insecurity: No Food Insecurity (12/06/2022)  Housing: Low Risk  (12/06/2022)  Transportation Needs: No Transportation Needs (12/06/2022)  Utilities: Not At Risk (12/06/2022)  Depression (PHQ2-9): Low Risk  (09/09/2020)  Tobacco Use: Medium Risk (12/05/2022)    Readmission Risk Interventions    04/01/2020   10:16 AM 03/30/2020   11:34 AM  Readmission Risk Prevention Plan  Transportation Screening  Complete  PCP or Specialist Appt within 3-5 Days Complete   HRI or Home Care Consult  Complete  Social Work Consult for Recovery Care Planning/Counseling  Complete  Palliative Care Screening  Not Applicable  Medication Review [employer])  Complete

## 2022-12-11 DIAGNOSIS — N1832 Chronic kidney disease, stage 3b: Secondary | ICD-10-CM | POA: Diagnosis not present

## 2022-12-11 DIAGNOSIS — I5022 Chronic systolic (congestive) heart failure: Secondary | ICD-10-CM | POA: Diagnosis not present

## 2022-12-11 DIAGNOSIS — G9389 Other specified disorders of brain: Secondary | ICD-10-CM | POA: Diagnosis not present

## 2022-12-11 DIAGNOSIS — I10 Essential (primary) hypertension: Secondary | ICD-10-CM | POA: Diagnosis not present

## 2022-12-11 LAB — BASIC METABOLIC PANEL
Anion gap: 15 (ref 5–15)
BUN: 45 mg/dL — ABNORMAL HIGH (ref 8–23)
CO2: 15 mmol/L — ABNORMAL LOW (ref 22–32)
Calcium: 9.7 mg/dL (ref 8.9–10.3)
Chloride: 106 mmol/L (ref 98–111)
Creatinine, Ser: 1.69 mg/dL — ABNORMAL HIGH (ref 0.61–1.24)
GFR, Estimated: 41 mL/min — ABNORMAL LOW (ref >60)
Glucose, Bld: 258 mg/dL — ABNORMAL HIGH (ref 70–99)
Potassium: 4.8 mmol/L (ref 3.5–5.1)
Sodium: 136 mmol/L (ref 135–145)

## 2022-12-11 LAB — GLUCOSE, CAPILLARY
Glucose-Capillary: 158 mg/dL — ABNORMAL HIGH (ref 70–99)
Glucose-Capillary: 232 mg/dL — ABNORMAL HIGH (ref 70–99)
Glucose-Capillary: 246 mg/dL — ABNORMAL HIGH (ref 70–99)

## 2022-12-11 NOTE — Progress Notes (Signed)
PROGRESS NOTE  Roy [name] WUJ:[id number] DOB: Apr 13, 1945   PCP: Irena Reichmann, DO  Patient is from: Home.  Lives with his wife.  DOA: 12/05/2022 LOS: 4  Chief complaints Chief Complaint  Patient presents with   Weakness     Brief Narrative / Interim history: 77 year old M with PMH of systolic CHF with ICD, CAD/CABG, remote testicular lymphoma s/p orchiectomy, CKD, DM-2, HTN and diminished hearing presented to ED with generalized weakness, falls, confusion and poor p.o. intake, and admitted for frequent PVCs, generalized weakness, falls and hypomagnesemia.  CT head showed right frontal mass.  MRI brain confirmed right frontal lobe mass suspicious for CNS lymphoma.  CT chest, abdomen and pelvis with no enlarged or progressive lymph nodes.  Oncology and radiation oncology consulted.  Patient was transferred to [facility] for further evaluation and care.  Plan for lumbar puncture on 11/18.  Subjective: Seen and examined earlier this morning.  Patient's wife at bedside.  No major events overnight of this morning.  Feels great.  Has no complaints.  Only questions about when lumbar puncture going to happen and what the next step would be.  Objective: Vitals:   12/10/22 2217 12/11/22 0238 12/11/22 0723 12/11/22 1320  BP: 135/85 (!) 129/99 136/89 119/73  Pulse: 89 81 62 67  Resp: 20 16 20 18   Temp: (!) 97.4 F (36.3 C) 98 F (36.7 C) (!) 97.4 F (36.3 C) (!) 97.4 F (36.3 C)  TempSrc: Oral Oral Oral Oral  SpO2: 98% 97% 100% 100%  Weight:      Height:        Examination:  GENERAL: No apparent distress.  Nontoxic. HEENT: MMM.  Vision and hearing grossly intact.  NECK: Supple.  No apparent JVD.  RESP:  No IWOB.  Fair aeration bilaterally. CVS:  RRR. Heart sounds normal.  ABD/GI/GU: BS+. Abd soft, NTND.  MSK/EXT:  Moves extremities. No apparent deformity. No edema.  SKIN: no apparent skin lesion or wound NEURO: Awake, alert and oriented appropriately.  No apparent focal neuro  deficit. PSYCH: Calm. Normal affect.   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  MRI brain showed 3.9 x 2.1 x 4.7 cm right frontal mass with additional smaller contrast-enhancing lesions in high right frontal lobe and near the posterior limb of internal capsule on the right.  CT chest, abdomen and pelvis with contrast without evidence of metastasis -Oncology on board -Continue Decadron 4 mg every 6 hours -Plan for diagnostic LP with CSF analysis which seems to be planned on 11/18  Chronic systolic CHF/cardiomyopathy with ICD: TTE 09/2022 with LVEF of 25 to 30%, GH, G1DD.  On Jardiance, Aldactone, Toprol-XL and losartan at home.  Appears euvolemic on exam.  Denies respiratory symptoms. -Continue home medications -Strict intake and output, daily weight, renal functions and electrolytes  CKD-3B: Stable Recent Labs    03/01/22 0955 07/08/22 1009 09/02/22 1000 11/07/22 1238 12/05/22 2134 12/06/22 0506 12/07/22 0213 12/08/22 0227 12/09/22 0811 12/11/22 1145  BUN 32* 28* 29* 23 13 12 17 14 17  45*  CREATININE 2.01* 2.02* 2.13* 1.44* 1.31* 1.41* 1.75* 1.49* 1.32* 1.69*  -Continue monitoring  NIDDM-2 with hyperglycemia: A1c 6.2%.  Hyperglycemia partly due to steroid Recent Labs  Lab 12/10/22 0634 12/10/22 1125 12/10/22 1619 12/10/22 2219 12/11/22 0725  GLUCAP 169* 151* 142* 226* 158*  -Continue current insulin regimen  Acute metabolic encephalopathy, multifactorial.  Seems  to have resolved. 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 -Per oncology  CAD/CABG: Stable -Continue home meds   Hypokalemia/hypomagnesemia -Monitor replenish as  appropriate  Generalized weakness/frequent falls -Fall precaution -PT/OT   Essential hypertension: Normotensive -Continue Toprol-XL, losartan, Aldactone  PVCs: No ventricular tachycardia. -Continue Toprol-XL -Cardiology recommended outpatient follow-up and signed off. -Optimize electrolytes        Body mass index is 26.79 kg/m.         DVT prophylaxis:  enoxaparin (LOVENOX) injection 40 mg Start: 12/06/22 1600  Code Status: Full code Family Communication: Updated patient's wife at bedside Level of care: Med-Surg Status is: Inpatient Remains inpatient appropriate because: Brain mass concerning for recurrence of lymphoma   Final disposition: SNF Consultants:  Oncology Cardiology  55 minutes with more than 50% spent in reviewing records, counseling patient/family and coordinating care.   Sch Meds:  Scheduled Meds:  aspirin EC  81 mg Oral Daily   atorvastatin  40 mg Oral Daily   dexamethasone  4 mg Intravenous Q6H   enoxaparin (LOVENOX) injection  40 mg Subcutaneous Q24H   famotidine  40 mg Oral Daily   feeding supplement  237 mL Oral BID BM   insulin aspart  0-5 Units Subcutaneous QHS   insulin aspart  0-9 Units Subcutaneous TID WC   losartan  50 mg Oral Daily   metoprolol  200 mg Oral Daily   spironolactone  12.5 mg Oral Daily   Continuous Infusions: PRN Meds:.acetaminophen **OR** acetaminophen, ondansetron **OR** ondansetron (ZOFRAN) IV, mouth rinse  Antimicrobials: Anti-infectives (From admission, onward)    None        I have personally reviewed the following labs and images: CBC: Recent Labs  Lab 12/05/22 2134 12/06/22 0506  WBC 11.9* 9.3  NEUTROABS 10.1*  --   HGB 10.3* 9.9*  HCT 33.1* 31.2*  MCV 93.8 93.1  PLT 254 246   BMP &GFR Recent Labs  Lab 12/05/22 2134 12/06/22 0506 12/07/22 0213 12/08/22 0227 12/09/22 0811 12/11/22 1145  NA 141 141 136 135 138 136  K 3.7 2.9* 3.7 3.6 4.1 4.8  CL 113* 113* 108 107 110 106  CO2 18*  17* 18* 19* 18* 15*  GLUCOSE 110* 102* 129* 124* 134* 258*  BUN 13 12 17 14 17  45*  CREATININE 1.31* 1.41* 1.75* 1.49* 1.32* 1.69*  CALCIUM 8.2* 8.1* 8.4* 8.5* 9.1 9.7  MG 0.9* 2.0 1.6* 2.3  --   --    Estimated Creatinine Clearance: 38.9 mL/min (A) (by C-G formula based on SCr of 1.69 mg/dL (H)). Liver & Pancreas: Recent Labs  Lab 12/05/22 2134  AST 17  ALT 12  ALKPHOS 96  BILITOT 0.7  PROT 6.3*  ALBUMIN 3.4*   No results for input(s): "LIPASE", "AMYLASE" in the last 168 hours. No results for input(s): "AMMONIA" in the last 168 hours. Diabetic: No results for input(s): "HGBA1C" in the last 72 hours. Recent Labs  Lab 12/10/22 0634 12/10/22 1125 12/10/22 1619 12/10/22 2219 12/11/22 0725  GLUCAP 169* 151* 142* 226* 158*   Cardiac Enzymes: No results for input(s): "CKTOTAL", "CKMB", "CKMBINDEX", "TROPONINI" in the last 168 hours. Recent Labs    09/02/22 1000  PROBNP 609*   Coagulation Profile: No results for input(s): "INR", "PROTIME" in the last 168 hours. Thyroid Function Tests: No results for input(s): "TSH", "T4TOTAL", "FREET4", "T3FREE", "THYROIDAB" in  the last 72 hours. Lipid Profile: No results for input(s): "CHOL", "HDL", "LDLCALC", "TRIG", "CHOLHDL", "LDLDIRECT" in the last 72 hours. Anemia Panel: No results for input(s): "VITAMINB12", "FOLATE", "FERRITIN", "TIBC", "IRON", "RETICCTPCT" in the last 72 hours. Urine analysis:    Component Value Date/Time   COLORURINE AMBER (A) 12/06/2022 0125   APPEARANCEUR HAZY (A) 12/06/2022 0125   LABSPEC 1.028 12/06/2022 0125   PHURINE 5.0 12/06/2022 0125   GLUCOSEU >=500 (A) 12/06/2022 0125   HGBUR NEGATIVE 12/06/2022 0125   BILIRUBINUR NEGATIVE 12/06/2022 0125   KETONESUR 5 (A) 12/06/2022 0125   PROTEINUR NEGATIVE 12/06/2022 0125   UROBILINOGEN 1.0 03/12/2013 1936   NITRITE NEGATIVE 12/06/2022 0125   LEUKOCYTESUR NEGATIVE 12/06/2022 0125   Sepsis Labs: Invalid input(s): "PROCALCITONIN",  "LACTICIDVEN"  Microbiology: Recent Results (from the past 240 hour(s))  MRSA Next Gen by PCR, Nasal     Status: None   Collection Time: 12/06/22  2:49 PM   Specimen: Nasal Mucosa; Nasal Swab  Result Value Ref Range Status   MRSA by PCR Next Gen NOT DETECTED NOT DETECTED Final    Comment: (NOTE) The GeneXpert MRSA Assay (FDA approved for NASAL specimens only), is one component of a comprehensive MRSA colonization surveillance program. It is not intended to diagnose MRSA infection nor to guide or monitor treatment for MRSA infections. Test performance is not FDA approved in patients less than 81 years old. Performed at [facility], [address], [place], [place] [address]     Radiology Studies: No results found.    Lorella Gomez T. Arva Slaugh [facility]  If 7PM-7AM, please contact night-coverage [email or web address] 12/11/2022, 1:51 PM

## 2022-12-12 ENCOUNTER — Inpatient Hospital Stay (HOSPITAL_COMMUNITY): Payer: Medicare HMO

## 2022-12-12 ENCOUNTER — Encounter: Payer: Self-pay | Admitting: Internal Medicine

## 2022-12-12 ENCOUNTER — Encounter (HOSPITAL_COMMUNITY): Payer: Self-pay | Admitting: Internal Medicine

## 2022-12-12 ENCOUNTER — Ambulatory Visit
Admit: 2022-12-12 | Discharge: 2022-12-12 | Disposition: A | Discharge status: Home / Self Care | Payer: Medicare HMO | Attending: Radiation Oncology | Admitting: Radiation Oncology

## 2022-12-12 DIAGNOSIS — G9389 Other specified disorders of brain: Secondary | ICD-10-CM | POA: Diagnosis not present

## 2022-12-12 DIAGNOSIS — C8330 Diffuse large B-cell lymphoma, unspecified site: Secondary | ICD-10-CM | POA: Diagnosis not present

## 2022-12-12 DIAGNOSIS — N1832 Chronic kidney disease, stage 3b: Secondary | ICD-10-CM | POA: Diagnosis not present

## 2022-12-12 DIAGNOSIS — I10 Essential (primary) hypertension: Secondary | ICD-10-CM | POA: Diagnosis not present

## 2022-12-12 DIAGNOSIS — I5022 Chronic systolic (congestive) heart failure: Secondary | ICD-10-CM | POA: Diagnosis not present

## 2022-12-12 DIAGNOSIS — C83398 Diffuse large b-cell lymphoma of other extranodal and solid organ sites: Secondary | ICD-10-CM

## 2022-12-12 DIAGNOSIS — C8339 Primary central nervous system lymphoma: Secondary | ICD-10-CM | POA: Diagnosis not present

## 2022-12-12 LAB — RENAL FUNCTION PANEL
Albumin: 3.3 g/dL — ABNORMAL LOW (ref 3.5–5.0)
Anion gap: 9 (ref 5–15)
BUN: 56 mg/dL — ABNORMAL HIGH (ref 8–23)
CO2: 18 mmol/L — ABNORMAL LOW (ref 22–32)
Calcium: 9.1 mg/dL (ref 8.9–10.3)
Chloride: 109 mmol/L (ref 98–111)
Creatinine, Ser: 1.68 mg/dL — ABNORMAL HIGH (ref 0.61–1.24)
GFR, Estimated: 41 mL/min — ABNORMAL LOW (ref >60)
Glucose, Bld: 208 mg/dL — ABNORMAL HIGH (ref 70–99)
Phosphorus: 3.5 mg/dL (ref 2.5–4.6)
Potassium: 4.2 mmol/L (ref 3.5–5.1)
Sodium: 136 mmol/L (ref 135–145)

## 2022-12-12 LAB — CBC
HCT: 34 % — ABNORMAL LOW (ref 39.0–52.0)
Hemoglobin: 10.9 g/dL — ABNORMAL LOW (ref 13.0–17.0)
MCH: 30 pg (ref 26.0–34.0)
MCHC: 32.1 g/dL (ref 30.0–36.0)
MCV: 93.7 fL (ref 80.0–100.0)
Platelets: 276 10*3/uL (ref 150–400)
RBC: 3.63 MIL/uL — ABNORMAL LOW (ref 4.22–5.81)
RDW: 12.8 % (ref 11.5–15.5)
WBC: 17.8 10*3/uL — ABNORMAL HIGH (ref 4.0–10.5)
nRBC: 0 % (ref 0.0–0.2)

## 2022-12-12 LAB — GLUCOSE, CAPILLARY
Glucose-Capillary: 275 mg/dL — ABNORMAL HIGH (ref 70–99)
Glucose-Capillary: 192 mg/dL — ABNORMAL HIGH (ref 70–99)
Glucose-Capillary: 285 mg/dL — ABNORMAL HIGH (ref 70–99)
Glucose-Capillary: 181 mg/dL — ABNORMAL HIGH (ref 70–99)
Glucose-Capillary: 269 mg/dL — ABNORMAL HIGH (ref 70–99)

## 2022-12-12 LAB — PROTEIN AND GLUCOSE, CSF
Glucose, CSF: 143 mg/dL — ABNORMAL HIGH (ref 40–70)
Total  Protein, CSF: 143 mg/dL — ABNORMAL HIGH (ref 15–45)

## 2022-12-12 LAB — CSF CELL COUNT WITH DIFFERENTIAL
RBC Count, CSF: 2 /mm3 — ABNORMAL HIGH
Tube #: 4
WBC, CSF: 0 /mm3 (ref 0–5)

## 2022-12-12 LAB — MAGNESIUM: Magnesium: 1.9 mg/dL (ref 1.7–2.4)

## 2022-12-12 MED ORDER — INSULIN ASPART 100 UNIT/ML IJ SOLN
0.0000 [IU] | Freq: Three times a day (TID) | INTRAMUSCULAR | Status: DC
  Administered 2022-12-12: 3 [IU] via SUBCUTANEOUS
  Administered 2022-12-13 (×2): 8 [IU] via SUBCUTANEOUS
  Administered 2022-12-13: 3 [IU] via SUBCUTANEOUS
  Administered 2022-12-14: 2 [IU] via SUBCUTANEOUS
  Administered 2022-12-14: 8 [IU] via SUBCUTANEOUS
  Administered 2022-12-14 – 2022-12-15 (×4): 3 [IU] via SUBCUTANEOUS
  Administered 2022-12-16: 8 [IU] via SUBCUTANEOUS
  Administered 2022-12-16: 5 [IU] via SUBCUTANEOUS
  Administered 2022-12-16 – 2022-12-17 (×3): 3 [IU] via SUBCUTANEOUS
  Administered 2022-12-17 – 2022-12-18 (×2): 2 [IU] via SUBCUTANEOUS
  Administered 2022-12-18 (×2): 3 [IU] via SUBCUTANEOUS
  Administered 2022-12-19: 2 [IU] via SUBCUTANEOUS
  Administered 2022-12-19: 5 [IU] via SUBCUTANEOUS
  Administered 2022-12-19 – 2022-12-21 (×5): 2 [IU] via SUBCUTANEOUS
  Administered 2022-12-22: 5 [IU] via SUBCUTANEOUS
  Administered 2022-12-22 – 2022-12-23 (×3): 3 [IU] via SUBCUTANEOUS
  Administered 2022-12-23 – 2022-12-24 (×2): 2 [IU] via SUBCUTANEOUS
  Administered 2022-12-24: 5 [IU] via SUBCUTANEOUS
  Administered 2022-12-24 – 2022-12-25 (×2): 2 [IU] via SUBCUTANEOUS
  Administered 2022-12-25: 3 [IU] via SUBCUTANEOUS
  Administered 2022-12-25: 2 [IU] via SUBCUTANEOUS
  Administered 2022-12-26: 3 [IU] via SUBCUTANEOUS
  Administered 2022-12-27 (×2): 2 [IU] via SUBCUTANEOUS

## 2022-12-12 MED ORDER — INSULIN GLARGINE-YFGN 100 UNIT/ML ~~LOC~~ SOLN
10.0000 [IU] | Freq: Every day | SUBCUTANEOUS | Status: DC
  Administered 2022-12-12 – 2022-12-14 (×3): 10 [IU] via SUBCUTANEOUS
  Filled 2022-12-12 (×3): qty 0.1

## 2022-12-12 NOTE — Progress Notes (Signed)
Radiation Oncology         [phone] ________________________________  Initial Inpatient Consultation  Name: Roy [name] MRN: [id number]  Date of Service: 12/12/2022 DOB: 04-24-1945  WG:NFAOZHY, Annabelle Harman, DO  Johney Maine, MD   REFERRING PHYSICIAN: Johney Maine, MD  DIAGNOSIS: 77 y.o. 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     ICD-10-CM   1. Diffuse large B-cell lymphoma, unspecified body region [facility])  C83.30       HISTORY OF PRESENT ILLNESS: Roy [name] is a 77 y.o. male seen at the request of Dr. 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 The patient was originally diagnosed with left-sided primary testicular large B-cell lymphoma in 2022. He underwent an ipsilateral orchiectomy and adjuvant R-CEOP and intrathecal methotrexate. He also received radiation to his contralateral testis. He remained in remission until recently.   On 12/06/22 he presented to the ER due to a fall earlier that day when he had slipped out of his chair and was unable to stand and walk due to weakness. He also endorsed worsening weakness over the past 8+ months and his wife noted confusion and possible left sided weakness and neglect. MRI of the brain from 12/08/2022 showed a dominant contrast enhancing-mass in the right frontal lobe, measuring 3.9 x 2.1 x 4.7 cm with an additional smaller contrast-enhancing lesions in the high right frontal lobe. 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  Patient was placed on Decadron 4 mg every 6 hours and admitted for further workup. CT CAP on 12/10/2022 showed no enlarged or progressive lymph nodes.  He is scheduled for diagnostic LP with CSF analysis later today.  His case was presented at our tumor board earlier this morning and the consensus was to treat the brain disease with a course of palliative radiation versus high-dose methotrexate.  We were kindly consulted today to  discuss palliative radiotherapy.  PREVIOUS RADIATION THERAPY: Yes   03/01/21 - 03/23/21: The right testicle was treated to 30.6 Gy in 17 fractions of 1.8 Gy for purpose of prophylaxis/cure.   PAST MEDICAL HISTORY:  Past Medical History:  Diagnosis Date   Allergic rhinitis    Arthritis    wrists   Cardiomyopathy, nonischemic [facility])    followed by cardiology--- dr Delton See---  2016 ef 45-50% ,  2016 nuclear ef 37%,  2017 per echo ef 50-55%   CHF (congestive heart failure), NYHA class III (HCC) 03/21/2020   Coronary artery disease    GERD (gastroesophageal reflux disease)    Hiatal hernia    History of kidney stones    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;   01/ 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   History of syncope (03-06-2020 pt stated has not had sycopal episode in few years, stated it seems to happen in extreme hot conditions)   cardiologist--- dr Eloy End--- dx recurrent syncope;  nuclear study 11-03-2014 intermediate risk w/ no ischemia, apical hypokinesis, nuclear ef 37%;  event monitor-- 12-21-2015 SB/ ST  no pauses/ arrythmia's;  echo 08-03-2015 ef 50-55%   Hyperlipidemia    Hypertension    followed by pcp   Hypovitaminosis D    Mass of left testicle    Nocturia    Plantar fasciitis    Presence of surgical incision    01/ 2022  moh's w/ skin graft of nose, per pt still healing and wear bandage daily  Type 2 diabetes mellitus (HCC)    pt is adament that he is not and have been told he is a diabetic but a borderline;  followed by pcp, in pcp note states DM2 and takes 2 meds daily   Wears glasses    Wears hearing aid in both ears    Weight loss 11/16/2020      PAST SURGICAL HISTORY: Past Surgical History:  Procedure Laterality Date   COLONOSCOPY  last one 01-30-2017   CORONARY ARTERY BYPASS GRAFT N/A 03/25/2020   Procedure: CORONARY ARTERY BYPASS GRAFTING (CABG), ON PUMP, TIMES FOUR, USING BILATERAL INTERNAL MAMMARY ARTERIES AND LEFT  RADIAL ARTERY;  Surgeon: Linden Dolin, MD;  Location: [facility] OR;  Service: Open Heart Surgery;  Laterality: N/A;   ICD IMPLANT N/A 08/23/2021   Procedure: ICD IMPLANT;  Surgeon: Marinus Maw, MD;  Location: [facility] INVASIVE CV LAB;  Service: Cardiovascular;  Laterality: N/A;   IR IMAGING GUIDED PORT INSERTION  07/06/2020   LOW ANTERIOR RESECTION RECTUM W/ COLOPROCTOSTOMY  05/2003   MOHS SURGERY  01/2020   nose w/ graft   ORCHIECTOMY Left 03/11/2020   Procedure: Clearance Coots;  Surgeon: Rene Paci, MD;  Location: [facility];  Service: Urology;  Laterality: Left;  ONLY NEEDS 60 MIN   PORTA CATH REMOVAL N/A 08/23/2021   Procedure: PORTA CATH REMOVAL;  Surgeon: Marinus Maw, MD;  Location: [facility] INVASIVE CV LAB;  Service: Cardiovascular;  Laterality: N/A;   RADIAL ARTERY HARVEST Left 03/25/2020   Procedure: LEFT RADIAL ARTERY HARVEST;  Surgeon: Linden Dolin, MD;  Location: [facility] OR;  Service: Open Heart Surgery;  Laterality: Left;   RIGHT/LEFT HEART CATH AND CORONARY ANGIOGRAPHY N/A 03/23/2020   Procedure: RIGHT/LEFT HEART CATH AND CORONARY ANGIOGRAPHY;  Surgeon: Corky Crafts, MD;  Location: [facility] INVASIVE CV LAB;  Service: Cardiovascular;  Laterality: N/A;   SHOULDER SURGERY Right 1992; 07/ 2021   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 Left 12/24/2008   left ear and nose    TEE WITHOUT CARDIOVERSION N/A 03/25/2020   Procedure: TRANSESOPHAGEAL ECHOCARDIOGRAM (TEE);  Surgeon: Linden Dolin, MD;  Location: [facility] OR;  Service: Open Heart Surgery;  Laterality: N/A;   TONSILLECTOMY AND ADENOIDECTOMY  child   UPPER GASTROINTESTINAL ENDOSCOPY  last one 06-06-2017    FAMILY HISTORY:  Family History  Problem Relation Age of Onset   Heart attack Mother    Stroke Father    Colon cancer Neg Hx    Esophageal cancer Neg Hx    Pancreatic cancer Neg Hx    Rectal cancer Neg Hx    Stomach cancer Neg Hx    Colon polyps Neg Hx     SOCIAL HISTORY:  Social  History   Socioeconomic History   Marital status: Married    Spouse name: Not on file   Number of children: Not on file   Years of education: Not on file   Highest education level: Not on file  Occupational History   Not on file  Tobacco Use   Smoking status: Former    Types: Pipe    Quit date: 11/29/1976    Years since quitting: 46.0   Smokeless tobacco: Never  Vaping Use   Vaping status: Never Used  Substance and Sexual Activity   Alcohol use: Yes    Alcohol/week: 0.0 standard drinks of alcohol    Comment: Occasional drink    Drug use: Never   Sexual activity: Not on file  Other  Topics Concern   Not on file  Social History Narrative   Not on file   Social Determinants of Health   Financial Resource Strain: Not on file  Food Insecurity: No Food Insecurity (12/06/2022)   Hunger Vital Sign    Worried About Running Out of Food in the Last Year: Never true    Ran Out of Food in the Last Year: Never true  Transportation Needs: No Transportation Needs (12/06/2022)   PRAPARE - Administrator, Civil Service (Medical): No    Lack of Transportation (Non-Medical): No  Physical Activity: Not on file  Stress: Not on file  Social Connections: Not on file  Intimate Partner Violence: Not At Risk (12/06/2022)   Humiliation, Afraid, Rape, and Kick questionnaire    Fear of Current or Ex-Partner: No    Emotionally Abused: No    Physically Abused: No    Sexually Abused: No    ALLERGIES: Patient has no known allergies.  MEDICATIONS:  No current facility-administered medications for this encounter.   No current outpatient medications on file.   Facility-Administered Medications Ordered in Other Encounters  Medication Dose Route Frequency Provider Last Rate Last Admin   acetaminophen (TYLENOL) tablet 650 mg  650 mg Oral Q6H PRN Hillary Bow, DO       Or   acetaminophen (TYLENOL) suppository 650 mg  650 mg Rectal Q6H PRN Hillary Bow, DO       aspirin EC  tablet 81 mg  81 mg Oral Daily Lyda Perone M, DO   81 mg at 12/12/22 0855   atorvastatin (LIPITOR) tablet 40 mg  40 mg Oral Daily Lyda Perone M, DO   40 mg at 12/12/22 0855   dexamethasone (DECADRON) injection 4 mg  4 mg Intravenous Q6H Johney Maine, MD   4 mg at 12/12/22 0519   enoxaparin (LOVENOX) injection 40 mg  40 mg Subcutaneous Q24H Lyda Perone M, DO   40 mg at 12/11/22 1646   famotidine (PEPCID) tablet 40 mg  40 mg Oral Daily Croitoru, Mihai, MD   40 mg at 12/12/22 0856   feeding supplement (ENSURE ENLIVE / ENSURE PLUS) liquid 237 mL  237 mL Oral BID BM Marinda Elk, MD   237 mL at 12/11/22 1415   insulin aspart (novoLOG) injection 0-5 Units  0-5 Units Subcutaneous QHS Hillary Bow, DO   2 Units at 12/11/22 2142   insulin aspart (novoLOG) injection 0-9 Units  0-9 Units Subcutaneous TID WC Hillary Bow, DO   3 Units at 12/11/22 1648   insulin glargine-yfgn (SEMGLEE) injection 10 Units  10 Units Subcutaneous Daily Almon Hercules, MD       losartan (COZAAR) tablet 50 mg  50 mg Oral Daily Croitoru, Mihai, MD   50 mg at 12/12/22 0856   metoprolol succinate (TOPROL-XL) 24 hr tablet 200 mg  200 mg Oral Daily Lyda Perone M, DO   200 mg at 12/12/22 0856   ondansetron (ZOFRAN) tablet 4 mg  4 mg Oral Q6H PRN Hillary Bow, DO       Or   ondansetron [facility]) injection 4 mg  4 mg Intravenous Q6H PRN Hillary Bow, DO       Oral care mouth rinse  15 mL Mouth Rinse PRN Arrien, York Ram, MD       spironolactone (ALDACTONE) tablet 12.5 mg  12.5 mg Oral Daily Lyda Perone M, DO   12.5 mg at 12/11/22 [phone]  REVIEW OF SYSTEMS:  On review of systems, the patient reports that he is feeling better since being on steroids. He states since being on steroids he has regained strength. He endorses some occasionally blurred vision. He denies any nausea, changes in his memory or mood.     PHYSICAL EXAM:  Wt Readings from Last 3 Encounters:  12/09/22 197 lb 8.5 oz  (89.6 kg)  11/07/22 207 lb 1.6 oz (93.9 kg)  09/02/22 203 lb 12.8 oz (92.4 kg)   Temp Readings from Last 3 Encounters:  12/12/22 97.6 F (36.4 C) (Oral)  11/07/22 97.9 F (36.6 C) (Oral)  03/01/22 97.9 F (36.6 C) (Temporal)   BP Readings from Last 3 Encounters:  12/12/22 124/84  11/07/22 134/73  09/02/22 106/74   Pulse Readings from Last 3 Encounters:  12/12/22 78  11/07/22 83  09/02/22 (!) 50    /10  In general this is a well appearing male in no acute distress. He's alert and oriented x4 and appropriate throughout the examination. EOMs intact. Cardiopulmonary assessment is negative for acute distress and he exhibits normal effort. 5/5 strength in all extremities. Sensation grossly intact. Coordination and discrimination intact.    KPS = 50  100 - Normal; no complaints; no evidence of disease. 90   - Able to carry on normal activity; minor signs or symptoms of disease. 80   - Normal activity with effort; some signs or symptoms of disease. 15   - Cares for self; unable to carry on normal activity or to do active work. 60   - Requires occasional assistance, but is able to care for most of his personal needs. 50   - Requires considerable assistance and frequent medical care. 40   - Disabled; requires special care and assistance. 30   - Severely disabled; hospital admission is indicated although death not imminent. 20   - Very sick; hospital admission necessary; active supportive treatment necessary. 10   - Moribund; fatal processes progressing rapidly. 0     - Dead  Karnofsky DA, Abelmann WH, Craver LS and Burchenal JH [phone]) The use of the nitrogen mustards in the palliative treatment of carcinoma: with particular reference to bronchogenic carcinoma Cancer 1 [phone]  LABORATORY DATA:  Lab Results  Component Value Date   WBC 17.8 (H) 12/12/2022   HGB 10.9 (L) 12/12/2022   HCT 34.0 (L) 12/12/2022   MCV 93.7 12/12/2022   PLT 276 12/12/2022   Lab Results  Component  Value Date   NA 136 12/12/2022   K 4.2 12/12/2022   CL 109 12/12/2022   CO2 18 (L) 12/12/2022   Lab Results  Component Value Date   ALT 12 12/05/2022   AST 17 12/05/2022   ALKPHOS 96 12/05/2022   BILITOT 0.7 12/05/2022     RADIOGRAPHY: CT CHEST ABDOMEN PELVIS WO CONTRAST  Result Date: 12/10/2022 CLINICAL DATA:  Inpatient, presents with concern for recurrent large B-cell lymphoma. History of left orchiectomy for same in February 2022. Contrast withheld due to renal insufficiency. EXAM: CT CHEST, ABDOMEN AND PELVIS WITHOUT CONTRAST TECHNIQUE: Multidetector CT imaging of the chest, abdomen and pelvis was performed following the standard protocol without IV contrast. RADIATION DOSE REDUCTION: This exam was performed according to the departmental dose-optimization program which includes automated exposure control, adjustment of the mA and/or kV according to patient size and/or use of iterative reconstruction technique. COMPARISON:  Portable chest 12/05/2022, PA Lat chest 09/02/2022, PET-CT studies 12/03/2020 and 05/11/2020, CTA chest 03/21/2020. FINDINGS: CT CHEST FINDINGS Cardiovascular:  There is mild cardiomegaly again noted with a left chamber predominance. There are sternotomy sutures and CABG changes. Native coronary arteries are heavily calcified. The pulmonary trunk measures prominent at 3.2 cm indicating arterial hypertension, unchanged. There is aortic and great vessel atherosclerosis, mild amount without evidence of aortic aneurysm, with descending segment tortuosity. Pulmonary veins are nondistended. A left chest battery has been inserted since the previous PET-CT, with single lead right ventricular wire placement. Mediastinum/Nodes: Subcentimeter right paratracheal and subcarinal mediastinal lymph nodes are unchanged in the interval. No new or progressive adenopathy is seen without contrast. Thyroid is mostly obscured by metal artifact from the pacemaker/AID but normal where visible.  Moderate-sized hiatal hernia is increased in size since 2022. The thoracic trachea, main bronchi, thoracic esophagus are unremarkable. Lungs/Pleura: There are new bilateral minimal layering pleural effusions, slightly greater fluid on the left. No pneumothorax. There is mild subpleural reticulation in the lung bases, but no honeycombing. There is mild posterior atelectasis in the lower lobes. No focal pneumonia is evident. No pulmonary nodules are seen. Musculoskeletal: Osteopenia and thoracic kyphosis and degenerative change. No acute or destructive osseous findings. There are subacute healing fractures of the left lateral fifth, sixth, and seventh ribs and posterolateral left eighth and ninth ribs. 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 CT ABDOMEN PELVIS FINDINGS Hepatobiliary: There are a few small layering stones in the gallbladder but no wall thickening or biliary dilatation. The liver again demonstrating small scattered cysts, but without contrast no suspicious lesion is seen. Pancreas: There is mild pancreatic atrophy. No focal lesion is seen without contrast. Spleen: Unremarkable without contrast. Adrenals/Urinary Tract: There is no adrenal mass. There is no contour deforming mass of either unenhanced kidney. There is no urinary stone or obstruction. Bilateral perinephric fat stranding unchanged. The bladder is thickened but also not fully distended. Correlate clinically for cystitis or hypertrophy versus nondistention. Unchanged. Stomach/Bowel: Hiatal hernia. No dilatation or wall thickening including the appendix. Sigmoid diverticulosis without evidence of diverticulitis. Rectosigmoid patent surgical anastomosis is again shown. Vascular/Lymphatic: No abdominal, pelvic or inguinal adenopathy. Prior left orchiectomy. Moderate aortoiliac atherosclerosis. No AAA. Reproductive: Enlarged prostate, 5.2 cm transverse. Dystrophic calcifications left lobe chronically. Other: No abdominal  wall hernia or abnormality. No abdominopelvic ascites. Musculoskeletal: Degenerative changes lumbar spine mild osteopenia. No acute or significant osseous findings. IMPRESSION: 1. No evidence of enlarged or progressive lymph nodes in the chest, abdomen or pelvis without contrast. 2. New minimal bilateral pleural effusions with mild posterior atelectasis. 3. Cardiomegaly with CABG changes. 4. Prominent pulmonary trunk indicating arterial hypertension, unchanged. 5. Moderate-sized hiatal hernia increased in size since 2022. 6. Cholelithiasis. 7. Diverticulosis without evidence of diverticulitis. 8. Prostatomegaly. 9. Subacute healing fractures of the left lateral fifth, sixth, and seventh ribs and posterolateral left eighth and ninth ribs. 10. Osteopenia and degenerative change. 11. Aortic atherosclerosis. Aortic Atherosclerosis (ICD10-I70.0). Electronically Signed   By: Almira Bar M.D.   On: 12/10/2022 02:53   MR BRAIN W WO CONTRAST  Result Date: 12/08/2022 CLINICAL DATA:  Brain/CNS neoplasm, monitor follow up right frontal mass EXAM: MRI HEAD WITHOUT AND WITH CONTRAST TECHNIQUE: Multiplanar, multiecho pulse sequences of the brain and surrounding structures were obtained without and with intravenous contrast. CONTRAST:  9mL GADAVIST GADOBUTROL 1 MMOL/ML IV SOLN COMPARISON:  Same day CT head FINDINGS: Brain: Negative for an acute infarct. No hemorrhage. No hydrocephalus. No extra-axial fluid collection. No hydrocephalus. There is a background of mild chronic microvascular ischemic change. There is a dominant contrast-enhancing mass  right frontal lobe measuring 3.9 x 2.1 x 4.7 Cm (series 7, image 35). There is an additional smaller contrast-enhancing mass near the posterior limb of the internal capsule on the right measuring 9 x 8 mm (series 11, image 31). There has an additional separate contrast-enhancing lesion in the high right frontal lobe measuring 1.4 x 0.8 cm (series 11, image 48). There is T2/FLAIR  hyperintense signal surrounding this lesion. There is evidence of mild diffusion restriction associated with lesions. There is mass effect on the right lateral ventricular system Vascular: Normal flow voids. Skull and upper cervical spine: Normal marrow signal. Sinuses/Orbits: Small right mastoid effusion. No middle ear effusion. Paranasal sinuses are notable for mucosal thickening in the bilateral ethmoid sinuses. Orbits are unremarkable. Other: None. IMPRESSION: Dominant contrast-enhancing mass in the right frontal lobe measuring 3.9 x 2.1 x 4.7 cm with additional smaller contrast-enhancing lesions in the high right frontal lobe and near the posterior limb of the internal capsule on the right. 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 Electronically Signed   By: Lorenza Cambridge M.D.   On: 12/08/2022 14:30   CT HEAD WO CONTRAST ( )  Result Date: 12/08/2022 CLINICAL DATA:  Neuro deficit with acute stroke suspected. Weakness. EXAM: CT HEAD WITHOUT CONTRAST TECHNIQUE: Contiguous axial images were obtained from the base of the skull through the vertex without intravenous contrast. RADIATION DOSE REDUCTION: This exam was performed according to the departmental dose-optimization program which includes automated exposure control, adjustment of the mA and/or kV according to patient size and/or use of iterative reconstruction technique. COMPARISON:  01/14/2021 brain MRI FINDINGS: Brain: Indistinct masslike high-density in the anterior right frontal region with adjacent vasogenic type pattern of white matter low-density and expansion. There is local mass effect. No meningioma or other mass seen in this region on prior, concerning for aggressive an intra-axial mass, possibly related to the patient's history of lymphoma. No indication of acute infarct, hemorrhage, hydrocephalus, or shift. Vascular: No hyperdense vessel or unexpected calcification. Skull: Normal. Negative for fracture or focal lesion. Sinuses/Orbits: No  acute finding. IMPRESSION: Indistinct masslike area in the right frontal lobe with regional swelling and mass effect. Recommend brain MRI with contrast. Electronically Signed   By: Tiburcio Pea M.D.   On: 12/08/2022 06:27   DG Chest Port 1 View  Result Date: 12/05/2022 CLINICAL DATA:  Shortness of breath EXAM: PORTABLE CHEST 1 VIEW COMPARISON:  Chest x-ray 09/02/2022 FINDINGS: Sternotomy wires, mediastinal clips and left-sided pacemaker are again seen. The heart size and mediastinal contours are within normal limits. Both lungs are clear. The visualized skeletal structures are unremarkable. IMPRESSION: No active disease. Electronically Signed   By: Darliss Cheney M.D.   On: 12/05/2022 23:55   CUP PACEART REMOTE DEVICE CHECK  Result Date: 12/01/2022 Scheduled remote reviewed. Normal device function.  Recent increase in PVC's per trends, 918/hr over last 24hrs Thoracic impedance trending down Next remote 91 days. LA, CVRS     IMPRESSION/PLAN: 1. 77 y.o. 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   We reviewed the patient's case and most recent imaging.  MRI of the brain from 12/08/22 demonstrated a 4.7 cm mass in the right frontal lobe with additional smaller lesions noting the larger mass.  Given the patient's history, these findings are suspicious for CNS lymphoma.  He is scheduled for a diagnostic LP later today.  Patient's case was discussed at our tumor board this morning and the consensus was to proceed with radiation to the visualized brain  disease.   Today, we talked to the patient and family about the findings and work-up thus far.  We discussed the natural history of recurrent CNS lymphoma and general treatment, highlighting the role of radiotherapy in the management.  We discussed the available radiation techniques, and focused on the details of logistics and delivery.  We reviewed the anticipated acute and late sequelae associated with radiation in this  setting.   Traditionally, CNS lymphoma is treated with 5 weeks of radiation therapy, giving a 3 to 4-week course of treatment to the entire brain followed by a boost to the gross disease. Given the patient's age and symptoms, we recommend an alternative 3-1/2 week regimen that includes treating the whole brain at a lower daily dose, combined with a concurrent boost to the gross disease.   After further discussion, the patient and his wife would like to proceed with the 3-1/2 week regimen.  A consent form was reviewed and placed in his chart today. He is scheduled for CT simulation later today and his first treatment on 12/15/2022. We will wait for results of LP before proceeding with treatment.   We personally spent 60 minutes in this encounter including chart review, reviewing radiological studies, meeting face-to-face with the patient, entering orders and completing documentation.     Joyice Faster, PA-C    Margaretmary Dys, MD  [facility]  Radiation Oncology Direct Dial: [phone]  Fax: [phone] [email or web address]  Skype  [email or web address]

## 2022-12-12 NOTE — Progress Notes (Signed)
       Overnight   NAME: Roy [name] MRN: [id number] DOB : 1945/12/31    Date of Service   12/12/2022   HPI/Events of Note    Notified by RN for patient "fall" in room  Reported as no impact other than to rear end.  Bedside visit: Patient awake, alert, conversational, immediately speaks on entering room.  When asked what happened, patient explained that he got up to go to the bathroom placed his hands on the wall for assistance, felt unsteady and lowered himself to the floor to a sitting position. He goes on to explain that he did not fall with impact, nor did he hit his head, nor does he have any pain or discomfort.  He appears in no obvious or stated distress. He has no visible or obvious injuries.  No: Deformities, contusions, abrasions, punctures, bruising tears, lacerations, swelling.  Muscle response is equal bilaterally upper and lower. Strength equal bilaterally upper and lower.   Interventions/ Plan   Fall arresting pads  Neuro checks as ordered  Bed/Chair alarm use.      Chinita Greenland BSN MSNA MSN ACNPC-AG Acute Care Nurse Practitioner [facility]

## 2022-12-12 NOTE — Progress Notes (Signed)
TO BE COMPLETED BY RADIATION ONCOLOGIST OFFICE:   Patient Name: Roy [name]   Date of Birth: Mar 16, 1945   Radiation Oncologist: Dr. Margaretmary Dys  Site to be Treated: Brain  Will x-rays >10 MV be used? No  Will the radiation be >10 cm from the device? Yes  Planned Treatment Start Date: 12/14/2022  TO BE COMPLETED BY CARDIOLOGIST OFFICE:   Device Information:  Pacemaker []      ICD [x]    Brand: Biotronik: [id number] Model #: Biotronik [id number] Acticor 7 VR-T DX  Serial Number: [id number]     Date of Placement: 08/23/2021  Site of Placement: Chest  Remote Device Check--Frequency: Every 91 days   Last Check: 12/01/2022  Is the Patient Pacer Dependent?:  Yes []   No [x]   Does cardiologist request Radiation Oncology to schedule device testing by vendor for the following:  Prior to the Initiation of Treatments?  Yes []  No [x]  During Treatments?  Yes []  No [x]  Post Radiation Treatments?  Yes []  No [x]   Is device monitoring necessary by vendor/cardiologist team during treatments?  Yes []   No [x]   Is cardiac monitoring by Radiation Oncology nursing necessary during treatments? Yes [x]   No []   Do you recommend device be relocated prior to Radiation Treatment? Yes []   No [x]   **PLEASE LIST ANY NOTES OR SPECIAL REQUESTS:       CARDIOLOGIST SIGNATURE:  Dr. Lewayne Bunting Per Device Clinic Standing Orders, [name]  12/12/2022 4:24 PM  **Please route completed form back to Radiation Oncology Nursing and "P [facility] RAD ONC ADMIN", OR send an update if there will be a delay in having form completed by expected start date.  **Call [phone] if you have any questions or do not get an in-basket response from a Radiation Oncology staff member

## 2022-12-12 NOTE — Progress Notes (Signed)
HEMATOLOGY/ONCOLOGY INPATIENT PROGRESS NOTE  Date of Service: 12/12/2022  Inpatient Attending: .Almon Hercules, MD   SUBJECTIVE  Patient with h/o  Left-sided primary testicular large B-cell lymphoma diagnosed 02/2020. S/p ipsilateral orchiectomy S/p 6 cycles of R-CEOP and 3 dose of IT MTX (for CNS prophylaxis) and R Tto contralateral testis. BCL-2, BCL 6, c-Myc negative. Patient was in remission until recently.  Patient is conscious and alert during this visit. He notes his upper and lower extremities strengths has improved after starting steroids. He notes he is able to pull himself to seating position without any assistance. Patient has been working with PT and has been walking. He notes he has been eating well and has been staying well-hydrated.   He denies any new infection issues, fever, chills, night sweats, bone pain, unexpected weight loss, back pain, chest pain, or leg swelling. He denies confusion.   Radiation oncology will follow-up with patient today.    PHYSICAL EXAMINATION: . Vitals:   12/12/22 0103 12/12/22 0116 12/12/22 0532 12/12/22 0902  BP: 139/86 139/86 132/83 124/84  Pulse: 79 80 80 78  Resp: 18 18 20 20   Temp: 98 F (36.7 C) 98 F (36.7 C) 98.2 F (36.8 C) 97.6 F (36.4 C)  TempSrc: Oral Oral  Oral  SpO2: 98% 98% 97% 99%  Weight:      Height:       Filed Weights   12/05/22 2052 12/07/22 0417 12/09/22 0506  Weight: 87.1 kg 90.7 kg 89.6 kg   .Body mass index is 26.79 kg/m.  GENERAL:alert, in no acute distress and comfortable SKIN: skin color, texture, turgor are normal, no rashes or significant lesions EYES: normal, conjunctiva are pink and non-injected, sclera clear OROPHARYNX:no exudate, no erythema and lips, buccal mucosa, and tongue normal  NECK: supple, no JVD, thyroid normal size, non-tender, without nodularity LYMPH:  no palpable lymphadenopathy in the cervical, axillary or inguinal LUNGS: clear to auscultation with normal respiratory  effort HEART: regular rate & rhythm,  no murmurs and no lower extremity edema ABDOMEN: abdomen soft, non-tender, normoactive bowel sounds  Musculoskeletal: no cyanosis of digits and no clubbing  PSYCH: alert & oriented x 3 with fluent speech NEURO: no focal motor/sensory deficits  MEDICAL HISTORY:  Past Medical History:  Diagnosis Date   Allergic rhinitis    Arthritis    wrists   Cardiomyopathy, nonischemic (HCC)    followed by cardiology--- dr Delton See---  2016 ef 45-50% ,  2016 nuclear ef 37%,  2017 per echo ef 50-55%   CHF (congestive heart failure), NYHA class III (HCC) 03/21/2020   Coronary artery disease    GERD (gastroesophageal reflux disease)    Hiatal hernia    History of kidney stones    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;   01/ 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   History of syncope (03-06-2020 pt stated has not had sycopal episode in few years, stated it seems to happen in extreme hot conditions)   cardiologist--- dr Eloy End--- dx recurrent syncope;  nuclear study 11-03-2014 intermediate risk w/ no ischemia, apical hypokinesis, nuclear ef 37%;  event monitor-- 12-21-2015 SB/ ST  no pauses/ arrythmia's;  echo 08-03-2015 ef 50-55%   Hyperlipidemia    Hypertension    followed by pcp   Hypovitaminosis D    Mass of left testicle    Nocturia    Plantar fasciitis    Presence of surgical incision  01/ 2022  moh's w/ skin graft of nose, per pt still healing and wear bandage daily   Type 2 diabetes mellitus (HCC)    pt is adament that he is not and have been told he is a diabetic but a borderline;  followed by pcp, in pcp note states DM2 and takes 2 meds daily   Wears glasses    Wears hearing aid in both ears    Weight loss 11/16/2020    SURGICAL HISTORY: Past Surgical History:  Procedure Laterality Date   COLONOSCOPY  last one 01-30-2017   CORONARY ARTERY BYPASS GRAFT N/A 03/25/2020   Procedure: CORONARY ARTERY BYPASS GRAFTING  (CABG), ON PUMP, TIMES FOUR, USING BILATERAL INTERNAL MAMMARY ARTERIES AND LEFT RADIAL ARTERY;  Surgeon: Linden Dolin, MD;  Location: [facility] OR;  Service: Open Heart Surgery;  Laterality: N/A;   ICD IMPLANT N/A 08/23/2021   Procedure: ICD IMPLANT;  Surgeon: Marinus Maw, MD;  Location: [facility] INVASIVE CV LAB;  Service: Cardiovascular;  Laterality: N/A;   IR IMAGING GUIDED PORT INSERTION  07/06/2020   LOW ANTERIOR RESECTION RECTUM W/ COLOPROCTOSTOMY  05/2003   MOHS SURGERY  01/2020   nose w/ graft   ORCHIECTOMY Left 03/11/2020   Procedure: Clearance Coots;  Surgeon: Rene Paci, MD;  Location: [facility];  Service: Urology;  Laterality: Left;  ONLY NEEDS 60 MIN   PORTA CATH REMOVAL N/A 08/23/2021   Procedure: PORTA CATH REMOVAL;  Surgeon: Marinus Maw, MD;  Location: [facility] INVASIVE CV LAB;  Service: Cardiovascular;  Laterality: N/A;   RADIAL ARTERY HARVEST Left 03/25/2020   Procedure: LEFT RADIAL ARTERY HARVEST;  Surgeon: Linden Dolin, MD;  Location: [facility] OR;  Service: Open Heart Surgery;  Laterality: Left;   RIGHT/LEFT HEART CATH AND CORONARY ANGIOGRAPHY N/A 03/23/2020   Procedure: RIGHT/LEFT HEART CATH AND CORONARY ANGIOGRAPHY;  Surgeon: Corky Crafts, MD;  Location[facility] INVASIVE CV LAB;  Service: Cardiovascular;  Laterality: N/A;   SHOULDER SURGERY Right 1992; 07/ 2021   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 Left 12/24/2008   left ear and nose    TEE WITHOUT CARDIOVERSION N/A 03/25/2020   Procedure: TRANSESOPHAGEAL ECHOCARDIOGRAM (TEE);  Surgeon: Linden Dolin, MD;  Location[facility] OR;  Service: Open Heart Surgery;  Laterality: N/A;   TONSILLECTOMY AND ADENOIDECTOMY  child   UPPER GASTROINTESTINAL ENDOSCOPY  last one 06-06-2017    SOCIAL HISTORY: Social History   Socioeconomic History   Marital status: Married    Spouse name: Not on file   Number of children: Not on file   Years of education: Not on file   Highest education level: Not  on file  Occupational History   Not on file  Tobacco Use   Smoking status: Former    Types: Pipe    Quit date: 11/29/1976    Years since quitting: 46.0   Smokeless tobacco: Never  Vaping Use   Vaping status: Never Used  Substance and Sexual Activity   Alcohol use: Yes    Alcohol/week: 0.0 standard drinks of alcohol    Comment: Occasional drink    Drug use: Never   Sexual activity: Not on file  Other Topics Concern   Not on file  Social History Narrative   Not on file   Social Determinants of Health   Financial Resource Strain: Not on file  Food Insecurity: No Food Insecurity (12/06/2022)   Hunger Vital Sign    Worried About Running Out of Food in  the Last Year: Never true    Ran Out of Food in the Last Year: Never true  Transportation Needs: No Transportation Needs (12/06/2022)   PRAPARE - Administrator, Civil Service (Medical): No    Lack of Transportation (Non-Medical): No  Physical Activity: Not on file  Stress: Not on file  Social Connections: Not on file  Intimate Partner Violence: Not At Risk (12/06/2022)   Humiliation, Afraid, Rape, and Kick questionnaire    Fear of Current or Ex-Partner: No    Emotionally Abused: No    Physically Abused: No    Sexually Abused: No    FAMILY HISTORY: Family History  Problem Relation Age of Onset   Heart attack Mother    Stroke Father    Colon cancer Neg Hx    Esophageal cancer Neg Hx    Pancreatic cancer Neg Hx    Rectal cancer Neg Hx    Stomach cancer Neg Hx    Colon polyps Neg Hx     ALLERGIES:  has No Known Allergies.  MEDICATIONS:  Scheduled Meds:  aspirin EC  81 mg Oral Daily   atorvastatin  40 mg Oral Daily   dexamethasone  4 mg Intravenous Q6H   enoxaparin (LOVENOX) injection  40 mg Subcutaneous Q24H   famotidine  40 mg Oral Daily   feeding supplement  237 mL Oral BID BM   insulin aspart  0-5 Units Subcutaneous QHS   insulin aspart  0-9 Units Subcutaneous TID WC   insulin glargine-yfgn  10  Units Subcutaneous Daily   losartan  50 mg Oral Daily   metoprolol  200 mg Oral Daily   spironolactone  12.5 mg Oral Daily   Continuous Infusions: PRN Meds:.acetaminophen **OR** acetaminophen, ondansetron **OR** ondansetron (ZOFRAN) IV, mouth rinse  REVIEW OF SYSTEMS:    10 Point review of Systems was done is negative except as noted above.   LABORATORY DATA:  I have reviewed the data as listed  .    Latest Ref Rng & Units 12/12/2022    5:50 AM 12/06/2022    5:06 AM 12/05/2022    9:34 PM  CBC  WBC 4.0 - 10.5 K/uL 17.8  9.3  11.9   Hemoglobin 13.0 - 17.0 g/dL 16.1  9.9  09.6   Hematocrit 39.0 - 52.0 % 34.0  31.2  33.1   Platelets 150 - 400 K/uL 276  246  254     .    Latest Ref Rng & Units 12/12/2022    5:50 AM 12/11/2022   11:45 AM 12/09/2022    8:11 AM  CMP  Glucose 70 - 99 mg/dL 045  409  811   BUN 8 - 23 mg/dL 56  45  17   Creatinine 0.61 - 1.24 mg/dL 9.14  7.82  9.56   Sodium 135 - 145 mmol/L 136  136  138   Potassium 3.5 - 5.1 mmol/L 4.2  4.8  4.1   Chloride 98 - 111 mmol/L 109  106  110   CO2 22 - 32 mmol/L 18  15  18    Calcium 8.9 - 10.3 mg/dL 9.1  9.7  9.1      RADIOGRAPHIC STUDIES: I have personally reviewed the radiological images as listed and agreed with the findings in the report. CT CHEST ABDOMEN PELVIS WO CONTRAST  Result Date: 12/10/2022 CLINICAL DATA:  Inpatient, presents with concern for recurrent large B-cell lymphoma. History of left orchiectomy for same in February 2022. Contrast withheld due  to renal insufficiency. EXAM: CT CHEST, ABDOMEN AND PELVIS WITHOUT CONTRAST TECHNIQUE: Multidetector CT imaging of the chest, abdomen and pelvis was performed following the standard protocol without IV contrast. RADIATION DOSE REDUCTION: This exam was performed according to the departmental dose-optimization program which includes automated exposure control, adjustment of the mA and/or kV according to patient size and/or use of iterative reconstruction  technique. COMPARISON:  Portable chest 12/05/2022, PA Lat chest 09/02/2022, PET-CT studies 12/03/2020 and 05/11/2020, CTA chest 03/21/2020. FINDINGS: CT CHEST FINDINGS Cardiovascular: There is mild cardiomegaly again noted with a left chamber predominance. There are sternotomy sutures and CABG changes. Native coronary arteries are heavily calcified. The pulmonary trunk measures prominent at 3.2 cm indicating arterial hypertension, unchanged. There is aortic and great vessel atherosclerosis, mild amount without evidence of aortic aneurysm, with descending segment tortuosity. Pulmonary veins are nondistended. A left chest battery has been inserted since the previous PET-CT, with single lead right ventricular wire placement. Mediastinum/Nodes: Subcentimeter right paratracheal and subcarinal mediastinal lymph nodes are unchanged in the interval. No new or progressive adenopathy is seen without contrast. Thyroid is mostly obscured by metal artifact from the pacemaker/AID but normal where visible. Moderate-sized hiatal hernia is increased in size since 2022. The thoracic trachea, main bronchi, thoracic esophagus are unremarkable. Lungs/Pleura: There are new bilateral minimal layering pleural effusions, slightly greater fluid on the left. No pneumothorax. There is mild subpleural reticulation in the lung bases, but no honeycombing. There is mild posterior atelectasis in the lower lobes. No focal pneumonia is evident. No pulmonary nodules are seen. Musculoskeletal: Osteopenia and thoracic kyphosis and degenerative change. No acute or destructive osseous findings. There are subacute healing fractures of the left lateral fifth, sixth, and seventh ribs and posterolateral left eighth and ninth ribs. 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 CT ABDOMEN PELVIS FINDINGS Hepatobiliary: There are a few small layering stones in the gallbladder but no wall thickening or biliary dilatation. The liver again  demonstrating small scattered cysts, but without contrast no suspicious lesion is seen. Pancreas: There is mild pancreatic atrophy. No focal lesion is seen without contrast. Spleen: Unremarkable without contrast. Adrenals/Urinary Tract: There is no adrenal mass. There is no contour deforming mass of either unenhanced kidney. There is no urinary stone or obstruction. Bilateral perinephric fat stranding unchanged. The bladder is thickened but also not fully distended. Correlate clinically for cystitis or hypertrophy versus nondistention. Unchanged. Stomach/Bowel: Hiatal hernia. No dilatation or wall thickening including the appendix. Sigmoid diverticulosis without evidence of diverticulitis. Rectosigmoid patent surgical anastomosis is again shown. Vascular/Lymphatic: No abdominal, pelvic or inguinal adenopathy. Prior left orchiectomy. Moderate aortoiliac atherosclerosis. No AAA. Reproductive: Enlarged prostate, 5.2 cm transverse. Dystrophic calcifications left lobe chronically. Other: No abdominal wall hernia or abnormality. No abdominopelvic ascites. Musculoskeletal: Degenerative changes lumbar spine mild osteopenia. No acute or significant osseous findings. IMPRESSION: 1. No evidence of enlarged or progressive lymph nodes in the chest, abdomen or pelvis without contrast. 2. New minimal bilateral pleural effusions with mild posterior atelectasis. 3. Cardiomegaly with CABG changes. 4. Prominent pulmonary trunk indicating arterial hypertension, unchanged. 5. Moderate-sized hiatal hernia increased in size since 2022. 6. Cholelithiasis. 7. Diverticulosis without evidence of diverticulitis. 8. Prostatomegaly. 9. Subacute healing fractures of the left lateral fifth, sixth, and seventh ribs and posterolateral left eighth and ninth ribs. 10. Osteopenia and degenerative change. 11. Aortic atherosclerosis. Aortic Atherosclerosis (ICD10-I70.0). Electronically Signed   By: Almira Bar M.D.   On: 12/10/2022 02:53   MR BRAIN  W WO CONTRAST  Result Date: 12/08/2022 CLINICAL DATA:  Brain/CNS neoplasm, monitor follow up right frontal mass EXAM: MRI HEAD WITHOUT AND WITH CONTRAST TECHNIQUE: Multiplanar, multiecho pulse sequences of the brain and surrounding structures were obtained without and with intravenous contrast. CONTRAST:  9mL GADAVIST GADOBUTROL 1 MMOL/ML IV SOLN COMPARISON:  Same day CT head FINDINGS: Brain: Negative for an acute infarct. No hemorrhage. No hydrocephalus. No extra-axial fluid collection. No hydrocephalus. There is a background of mild chronic microvascular ischemic change. There is a dominant contrast-enhancing mass right frontal lobe measuring 3.9 x 2.1 x 4.7 Cm (series 7, image 35). There is an additional smaller contrast-enhancing mass near the posterior limb of the internal capsule on the right measuring 9 x 8 mm (series 11, image 31). There has an additional separate contrast-enhancing lesion in the high right frontal lobe measuring 1.4 x 0.8 cm (series 11, image 48). There is T2/FLAIR hyperintense signal surrounding this lesion. There is evidence of mild diffusion restriction associated with lesions. There is mass effect on the right lateral ventricular system Vascular: Normal flow voids. Skull and upper cervical spine: Normal marrow signal. Sinuses/Orbits: Small right mastoid effusion. No middle ear effusion. Paranasal sinuses are notable for mucosal thickening in the bilateral ethmoid sinuses. Orbits are unremarkable. Other: None. IMPRESSION: Dominant contrast-enhancing mass in the right frontal lobe measuring 3.9 x 2.1 x 4.7 cm with additional smaller contrast-enhancing lesions in the high right frontal lobe and near the posterior limb of the internal capsule on the right. 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 Electronically Signed   By: Lorenza Cambridge M.D.   On: 12/08/2022 14:30   CT HEAD WO CONTRAST ( )  Result Date: 12/08/2022 CLINICAL DATA:  Neuro deficit with acute stroke suspected.  Weakness. EXAM: CT HEAD WITHOUT CONTRAST TECHNIQUE: Contiguous axial images were obtained from the base of the skull through the vertex without intravenous contrast. RADIATION DOSE REDUCTION: This exam was performed according to the departmental dose-optimization program which includes automated exposure control, adjustment of the mA and/or kV according to patient size and/or use of iterative reconstruction technique. COMPARISON:  01/14/2021 brain MRI FINDINGS: Brain: Indistinct masslike high-density in the anterior right frontal region with adjacent vasogenic type pattern of white matter low-density and expansion. There is local mass effect. No meningioma or other mass seen in this region on prior, concerning for aggressive an intra-axial mass, possibly related to the patient's history of lymphoma. No indication of acute infarct, hemorrhage, hydrocephalus, or shift. Vascular: No hyperdense vessel or unexpected calcification. Skull: Normal. Negative for fracture or focal lesion. Sinuses/Orbits: No acute finding. IMPRESSION: Indistinct masslike area in the right frontal lobe with regional swelling and mass effect. Recommend brain MRI with contrast. Electronically Signed   By: Tiburcio Pea M.D.   On: 12/08/2022 06:27   DG Chest Port 1 View  Result Date: 12/05/2022 CLINICAL DATA:  Shortness of breath EXAM: PORTABLE CHEST 1 VIEW COMPARISON:  Chest x-ray 09/02/2022 FINDINGS: Sternotomy wires, mediastinal clips and left-sided pacemaker are again seen. The heart size and mediastinal contours are within normal limits. Both lungs are clear. The visualized skeletal structures are unremarkable. IMPRESSION: No active disease. Electronically Signed   By: Darliss Cheney M.D.   On: 12/05/2022 23:55   CUP PACEART REMOTE DEVICE CHECK  Result Date: 12/01/2022 Scheduled remote reviewed. Normal device function.  Recent increase in PVC's per trends, 918/hr over last 24hrs Thoracic impedance trending down Next remote 91 days.  LA, CVRS   ASSESSMENT & PLAN:  77 year old wonderful gentleman with history of  hypertension, borderline diabetes, dyslipidemia, GERD with recent STEMI on 03/21/2020 and status post four-vessel CABG on 03/25/2020.   1) h/o Left-sided primary testicular large B-cell lymphoma diagnosed in 02/2020 S/p ipsilateral orchiectomy S/p 6 cycles of R-CEOP and 3 dose of IT MTX (for CNS prophylaxis) and R Tto contralateral testis. BCL-2, BCL 6, c-Myc negative. 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   PLAN: -Discussed lab results from today, 12/12/2022, in detail with the patient. CBC shows elevated WBC at 17.8 K, improved hemoglobin of 10.9 g/d with hematocrit of 34.0%. CMP shows elevated BUN of 56 and elevated creatinine of 1.68.  -Steroids have been helping patient eat and has been helping him regain strength in his upper and lower extremities.  -Patient is conscious and alert during this visit.  -Pre-LP CT head was done as per IR request and reviewed. -Patient had lumbar puncture today -He is having CT simulation for CNS radiation -After radiation is started can reduce the dexamethasone to 4 mg every 8 hours for 3 to 4 days and then 4 mg every 12 hours till the completion of radiation and then tapering off as per radiation oncology input. -Oncology will continue to follow -Will plan to get MRI of the spine in the next few days  .The total time spent in the appointment was 35 minutes* .  All of the patient's questions were answered with apparent satisfaction. The patient knows to call the clinic with any problems, questions or  concerns.   Wyvonnia Lora MD MS AAHIVMS [facility] Hematology/Oncology Physician [facility]  .*Total Encounter Time as defined by the Centers for Medicare and Medicaid Services includes, in addition to the face-to-face time of a patient visit (documented in the note above) non-face-to-face time: obtaining and reviewing outside history, ordering and reviewing medications, tests or procedures, care coordination (communications with other health care professionals or caregivers) and documentation in the medical record.   12/12/2022 9:19 AM    I,Param Shah,acting as a scribe for Wyvonnia Lora, MD.  .I have reviewed the above documentation for accuracy and completeness, and I agree with the above. Wyvonnia Lora MD MS Hematology/Oncology Physician [facility]

## 2022-12-12 NOTE — Plan of Care (Signed)
  Problem: Education: Goal: Ability to describe self-care measures that may prevent or decrease complications (Diabetes Survival Skills Education) will improve Outcome: Progressing   Problem: Education: Goal: Individualized Educational Video(s) Outcome: Progressing   Problem: Coping: Goal: Ability to adjust to condition or change in health will improve Outcome: Progressing

## 2022-12-12 NOTE — Progress Notes (Signed)
PT Cancellation Note  Patient Details Name: Roy [name] MRN: [id number] DOB: 1945/04/16   Cancelled Treatment:    Reason Eval/Treat Not Completed: Patient at procedure or test/unavailable. In am head CT in pm lumbar puncture. PT to continue to follow acutely.   [name], PT Acute Rehab  [name] 12/12/2022, 3:17 PM

## 2022-12-12 NOTE — Progress Notes (Signed)
PROGRESS NOTE  Roy [name] ZOX:[id number] DOB: 1945-03-15   PCP: Irena Reichmann, DO  Patient is from: Home.  Lives with his wife.  DOA: 12/05/2022 LOS: 5  Chief complaints Chief Complaint  Patient presents with   Weakness     Brief Narrative / Interim history: 77 year old M with PMH of systolic CHF with ICD, CAD/CABG, remote testicular lymphoma s/p orchiectomy, CKD, DM-2, HTN and diminished hearing presented to ED with generalized weakness, falls, confusion and poor p.o. intake, and admitted for frequent PVCs, generalized weakness, falls and hypomagnesemia.  CT head showed right frontal mass.  MRI brain confirmed right frontal lobe mass suspicious for CNS lymphoma.  CT chest, abdomen and pelvis with no enlarged or progressive lymph nodes.  Oncology and radiation oncology consulted.  Patient was transferred to [facility] for further evaluation and care.   Underwent CT simulation on 11/18.  Plan for fluoroscopy guided lumbar puncture.   Subjective: Seen and examined earlier this morning.  Patient "slid down to the floor into sitting position" when he got up to go to bathroom last night.  No fall with impact.  No injury.  No complaint this morning.  Eating breakfast.  Objective: Vitals:   12/12/22 0116 12/12/22 0532 12/12/22 0902 12/12/22 1214  BP: 139/86 132/83 124/84 116/78  Pulse: 80 80 78 75  Resp: 18 20 20 18   Temp: 98 F (36.7 C) 98.2 F (36.8 C) 97.6 F (36.4 C) (!) 97.4 F (36.3 C)  TempSrc: Oral  Oral Oral  SpO2: 98% 97% 99% 100%  Weight:      Height:        Examination:  GENERAL: No apparent distress.  Nontoxic. HEENT: MMM.  Vision grossly intact.  Diminished hearing. NECK: Supple.  No apparent JVD.  RESP:  No IWOB.  Fair aeration bilaterally. CVS:  RRR. Heart sounds normal.  ABD/GI/GU: BS+. Abd soft, NTND.  MSK/EXT:  Moves extremities. No apparent deformity. No edema.  SKIN: no apparent skin lesion or wound NEURO: Awake, alert and oriented appropriately.  No  apparent focal neuro deficit. PSYCH: Calm. Normal affect.  Not engaging.  Focused on breakfast.  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  MRI brain showed 3.9 x 2.1 x 4.7 cm right frontal mass with additional smaller contrast-enhancing lesions in high right frontal lobe and near the posterior limb of internal capsule on the right.  CT chest, abdomen and pelvis with contrast without evidence of metastasis -Oncology and radiation oncology on board -Underwent simulation on 11/18. -Continue Decadron 4 mg every 6 hours -Plan for fluoroscopy guided LP today.  Chronic systolic CHF/cardiomyopathy with ICD: TTE 09/2022 with LVEF of 25 to 30%, GH, G1DD.  On Jardiance, Aldactone, Toprol-XL and losartan at home.  Appears euvolemic on exam.  Denies respiratory symptoms. -Continue home medications -Strict intake and output, daily weight, renal functions and electrolytes  CKD-3B: Stable Recent Labs    07/08/22 1009 09/02/22 1000 11/07/22 1238 12/05/22 2134 12/06/22 0506 12/07/22 0213 12/08/22 0227 12/09/22 0811 12/11/22 1145 12/12/22 0550  BUN 28* 29* 23 13 12 17 14 17  45* 56*  CREATININE 2.02* 2.13* 1.44* 1.31* 1.41* 1.75* 1.49* 1.32* 1.69* 1.68*  -Continue monitoring  NIDDM-2 with hyperglycemia: A1c 6.2%.  Hyperglycemia partly due to steroid Recent Labs  Lab 12/11/22 1204 12/11/22 1640 12/11/22 2131 12/12/22 0742 12/12/22 1122  GLUCAP 275* 232* 246* 192* 285*  -Increase SSI  to moderate -Add Semglee 10 units daily -Further adjustment as appropriate  Acute metabolic encephalopathy, multifactorial.  Seems to have resolved. 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 -Per oncology  CAD/CABG: Stable -Continue home meds   Hypokalemia/hypomagnesemia -Monitor replenish as appropriate  Generalized weakness/frequent falls/fall in the hospital: Patient "slid down to the floor into sitting position" when he got up to go to bathroom last night.  No fall with impact.  No injury.  CT head after fall without acute finding -Fall precaution in place -PT/OT   Essential hypertension: Normotensive -Continue Toprol-XL, losartan, Aldactone  PVCs: No ventricular tachycardia. -Continue Toprol-XL -Cardiology recommended outpatient follow-up and signed off. -Optimize electrolytes        Body mass index is 26.79 kg/m.         DVT prophylaxis:  enoxaparin (LOVENOX) injection 40 mg Start: 12/06/22 1600  Code Status: Full code Family Communication: Updated patient's wife at bedside on 11/17.  None at bedside today. Level of care: Med-Surg Status is: Inpatient Remains inpatient appropriate because: Brain mass concerning for recurrence of lymphoma   Final disposition: SNF Consultants:  Oncology Cardiology  55 minutes with more than 50% spent in reviewing records, counseling patient/family and coordinating care.   Sch Meds:  Scheduled Meds:  aspirin EC  81 mg Oral Daily   atorvastatin  40 mg Oral Daily   dexamethasone  4 mg Intravenous Q6H   enoxaparin (LOVENOX) injection  40 mg Subcutaneous Q24H   famotidine  40 mg Oral Daily   feeding supplement  237 mL Oral BID BM   insulin aspart  0-5 Units Subcutaneous QHS   insulin aspart  0-9 Units Subcutaneous TID WC   insulin glargine-yfgn  10 Units Subcutaneous Daily   losartan  50 mg Oral Daily   metoprolol  200 mg Oral Daily   spironolactone  12.5 mg Oral Daily   Continuous Infusions: PRN Meds:.acetaminophen **OR** acetaminophen, ondansetron **OR** ondansetron (ZOFRAN) IV, mouth rinse  Antimicrobials: Anti-infectives (From admission, onward)    None        I have personally  reviewed the following labs and images: CBC: Recent Labs  Lab 12/05/22 2134 12/06/22 0506 12/12/22 0550  WBC 11.9* 9.3 17.8*  NEUTROABS 10.1*  --   --   HGB 10.3* 9.9* 10.9*  HCT 33.1* 31.2* 34.0*  MCV 93.8 93.1 93.7  PLT 254 246 276   BMP &GFR Recent Labs  Lab 12/05/22 2134 12/06/22 0506 12/07/22 0213 12/08/22 0227 12/09/22 0811 12/11/22 1145 12/12/22 0550  NA 141 141 136 135 138 136 136  K 3.7 2.9* 3.7 3.6 4.1 4.8 4.2  CL 113* 113* 108 107 110 106 109  CO2 18* 17* 18* 19* 18* 15* 18*  GLUCOSE 110* 102* 129* 124* 134* 258* 208*  BUN 13 12 17 14 17  45* 56*  CREATININE 1.31* 1.41* 1.75* 1.49* 1.32* 1.69* 1.68*  CALCIUM 8.2* 8.1* 8.4* 8.5* 9.1 9.7 9.1  MG 0.9* 2.0 1.6* 2.3  --   --  1.9  PHOS  --   --   --   --   --   --  3.5   Estimated Creatinine Clearance: 39.1 mL/min (A) (by C-G formula based on SCr of 1.68 mg/dL (H)). Liver & Pancreas: Recent Labs  Lab 12/05/22 2134 12/12/22 0550  AST 17  --   ALT 12  --  ALKPHOS 96  --   BILITOT 0.7  --   PROT 6.3*  --   ALBUMIN 3.4* 3.3*   No results for input(s): "LIPASE", "AMYLASE" in the last 168 hours. No results for input(s): "AMMONIA" in the last 168 hours. Diabetic: No results for input(s): "HGBA1C" in the last 72 hours. Recent Labs  Lab 12/11/22 1204 12/11/22 1640 12/11/22 2131 12/12/22 0742 12/12/22 1122  GLUCAP 275* 232* 246* 192* 285*   Cardiac Enzymes: No results for input(s): "CKTOTAL", "CKMB", "CKMBINDEX", "TROPONINI" in the last 168 hours. Recent Labs    09/02/22 1000  PROBNP 609*   Coagulation Profile: No results for input(s): "INR", "PROTIME" in the last 168 hours. Thyroid Function Tests: No results for input(s): "TSH", "T4TOTAL", "FREET4", "T3FREE", "THYROIDAB" in the last 72 hours. Lipid Profile: No results for input(s): "CHOL", "HDL", "LDLCALC", "TRIG", "CHOLHDL", "LDLDIRECT" in the last 72 hours. Anemia Panel: No results for input(s): "VITAMINB12", "FOLATE", "FERRITIN", "TIBC",  "IRON", "RETICCTPCT" in the last 72 hours. Urine analysis:    Component Value Date/Time   COLORURINE AMBER (A) 12/06/2022 0125   APPEARANCEUR HAZY (A) 12/06/2022 0125   LABSPEC 1.028 12/06/2022 0125   PHURINE 5.0 12/06/2022 0125   GLUCOSEU >=500 (A) 12/06/2022 0125   HGBUR NEGATIVE 12/06/2022 0125   BILIRUBINUR NEGATIVE 12/06/2022 0125   KETONESUR 5 (A) 12/06/2022 0125   PROTEINUR NEGATIVE 12/06/2022 0125   UROBILINOGEN 1.0 03/12/2013 1936   NITRITE NEGATIVE 12/06/2022 0125   LEUKOCYTESUR NEGATIVE 12/06/2022 0125   Sepsis Labs: Invalid input(s): "PROCALCITONIN", "LACTICIDVEN"  Microbiology: Recent Results (from the past 240 hour(s))  MRSA Next Gen by PCR, Nasal     Status: None   Collection Time: 12/06/22  2:49 PM   Specimen: Nasal Mucosa; Nasal Swab  Result Value Ref Range Status   MRSA by PCR Next Gen NOT DETECTED NOT DETECTED Final    Comment: (NOTE) The GeneXpert MRSA Assay (FDA approved for NASAL specimens only), is one component of a comprehensive MRSA colonization surveillance program. It is not intended to diagnose MRSA infection nor to guide or monitor treatment for MRSA infections. Test performance is not FDA approved in patients less than 31 years old. Performed at [facility], [address], [place], [place] [address]     Radiology Studies: CT HEAD WO CONTRAST ( )  Result Date: 12/12/2022 CLINICAL DATA:  Brain/CNS neoplasm, assess treatment response fall and evaluation for increased ICP prior to LP per IR recommendations. EXAM: CT HEAD WITHOUT CONTRAST TECHNIQUE: Contiguous axial images were obtained from the base of the skull through the vertex without intravenous contrast. RADIATION DOSE REDUCTION: This exam was performed according to the departmental dose-optimization program which includes automated exposure control, adjustment of the mA and/or kV according to patient size and/or use of iterative reconstruction technique. COMPARISON:  Head CT  12/07/2022.  MRI brain 12/08/2022. FINDINGS: Brain: Unchanged hyperdense masses in the anteromedial right frontal lobe and posterior aspect of the right superior frontal gyrus with surrounding vasogenic edema, again suspicious for CNS involvement of lymphoma. Mild mass effect on the right-greater-than-left frontal horns of the lateral ventricles. No hydrocephalus or herniation. No extra-axial collection. Vascular: No hyperdense vessel or unexpected calcification. Skull: No calvarial fracture or suspicious bone lesion. Skull base is unremarkable. Sinuses/Orbits: No acute findings. Other: None. IMPRESSION: 1. Unchanged hyperdense masses in the anteromedial right frontal lobe and posterior aspect of the right superior frontal gyrus with surrounding vasogenic edema, again suspicious for CNS involvement of lymphoma. 2. No hydrocephalus or herniation. Electronically Signed  By: Orvan Falconer M.D.   On: 12/12/2022 12:03      Akaysha Cobern T. Samyria Rudie [facility]  If 7PM-7AM, please contact night-coverage [email or web address] 12/12/2022, 3:24 PM

## 2022-12-12 NOTE — Progress Notes (Addendum)
RN sitting directly outside of patient's room when RN heard patient's room door slam. RN went into patient's room and found patient sitting straight up behind room door. RN asked patient what happened and patient states that he was ambulating to the bathroom and loss balance. Patient stated that he then held onto the wall and slid down to the floor into a sitting position. Patient did have non-slip socks on at the time of "fall". He said he didn't fall with impact. He states he did not hit his head. RN and two other employees helped patient into a standing position and assisted him into chair.  Vitals sign are WNL. See below. No change to orientation. Patient states he does not have pain anywhere as he did not have a fall with impact. On call provider notified. No new orders. Patient returned to chair with chair alarm in place and armed. Fall mats in place. Patient denied family notification as he did not want to bother his wife this late.     12/12/22 0116  What Happened  Was fall witnessed? No  Was patient injured? No  Patient found on floor  Found by Staff-comment Kathaleen Bury, RN)  Stated prior activity ambulating-unassisted  Provider Notification  Provider Name/Title Chinita Greenland, NP  Date Provider Notified 12/12/22  Time Provider Notified 0100  Method of Notification Page (Secure CHat)  Notification Reason Fall  Provider response At bedside  Date of Provider Response 12/12/22  Time of Provider Response 0105  Follow Up  Family notified No - patient refusal (Pt sates he doesnt want [place] to bother his wife this late.)  Additional tests No  Simple treatment Other (comment) (None, No injuries)  Progress note created (see row info) Yes  Blank note created Yes  Adult Fall Risk Assessment  Risk Factor Category (scoring not indicated) Fall has occurred during this admission (document High fall risk)  Patient Fall Risk Level High fall risk  Adult Fall Risk Interventions  Required  Bundle Interventions *See Row Information* High fall risk - low, moderate, and high requirements implemented  Screening for Fall Injury Risk (To be completed on HIGH fall risk patients) - Assessing Need for Floor Mats  Risk For Fall Injury- Criteria for Floor Mats Previous fall this admission  Will Implement Floor Mats Yes  Vitals  Temp 98 F (36.7 C)  Temp Source Oral  BP 139/86  MAP (mmHg) 103  BP Location Left Arm  BP Method Automatic  Patient Position (if appropriate) Lying  Pulse Rate 80  Pulse Rate Source Monitor  Resp 18  Oxygen Therapy  SpO2 98 %  O2 Device Room Air  Pain Assessment  Pain Scale 0-10  Pain Score 0  PCA/Epidural/Spinal Assessment  Respiratory Pattern Regular;Unlabored  Neurological  Neuro (WDL) X  Level of Consciousness Alert  Orientation Level Oriented X4  Cognition Appropriate at baseline  Speech Clear  Neuro Symptoms Forgetful  Glasgow Coma Scale  Eye Opening 4  Best Verbal Response (NON-intubated) 5  Best Motor Response 6  Musculoskeletal  Musculoskeletal (WDL) X  Assistive Device Front wheel walker  Generalized Weakness Yes  Weight Bearing Restrictions No  Integumentary  Integumentary (WDL) X  Skin Color Appropriate for ethnicity  Skin Condition Dry  Skin Integrity Abrasion  Abrasion Location Arm;Leg;Nose  Abrasion Location Orientation Bilateral  Skin Turgor Non-tenting

## 2022-12-13 DIAGNOSIS — G9389 Other specified disorders of brain: Secondary | ICD-10-CM | POA: Diagnosis not present

## 2022-12-13 DIAGNOSIS — I5022 Chronic systolic (congestive) heart failure: Secondary | ICD-10-CM | POA: Diagnosis not present

## 2022-12-13 DIAGNOSIS — N1832 Chronic kidney disease, stage 3b: Secondary | ICD-10-CM | POA: Diagnosis not present

## 2022-12-13 DIAGNOSIS — I10 Essential (primary) hypertension: Secondary | ICD-10-CM | POA: Diagnosis not present

## 2022-12-13 LAB — GLUCOSE, CAPILLARY
Glucose-Capillary: 184 mg/dL — ABNORMAL HIGH (ref 70–99)
Glucose-Capillary: 291 mg/dL — ABNORMAL HIGH (ref 70–99)
Glucose-Capillary: 294 mg/dL — ABNORMAL HIGH (ref 70–99)
Glucose-Capillary: 209 mg/dL — ABNORMAL HIGH (ref 70–99)

## 2022-12-13 MED ORDER — INSULIN ASPART 100 UNIT/ML IJ SOLN
3.0000 [IU] | Freq: Three times a day (TID) | INTRAMUSCULAR | Status: DC
  Administered 2022-12-13 – 2022-12-14 (×3): 3 [IU] via SUBCUTANEOUS

## 2022-12-13 MED ORDER — DEXAMETHASONE 4 MG PO TABS
4.0000 mg | ORAL_TABLET | Freq: Three times a day (TID) | ORAL | Status: AC
  Administered 2022-12-13 – 2022-12-16 (×9): 4 mg via ORAL
  Filled 2022-12-13 (×9): qty 1

## 2022-12-13 MED ORDER — DEXAMETHASONE 4 MG PO TABS
4.0000 mg | ORAL_TABLET | Freq: Two times a day (BID) | ORAL | Status: DC
  Administered 2022-12-16 – 2022-12-27 (×23): 4 mg via ORAL
  Filled 2022-12-13 (×23): qty 1

## 2022-12-13 NOTE — Progress Notes (Signed)
Chaplain met with Durrer and his wife to provide education about filling out a living will and assigning a hcpoa.  They are planning to look through the document and think about HCPOA alternates. Chaplain encouraged them to utilize the document to put down as much as they felt was necessary for medical team and HCPOA to know.  Chaplain will return tomorrow afternoon to assist with notarization of document.

## 2022-12-13 NOTE — Plan of Care (Signed)
  Problem: Nutritional: Goal: Maintenance of adequate nutrition will improve Outcome: Progressing   Problem: Skin Integrity: Goal: Risk for impaired skin integrity will decrease Outcome: Progressing   Problem: Pain Management: Goal: General experience of comfort will improve Outcome: Progressing   Problem: Safety: Goal: Ability to remain free from injury will improve Outcome: Progressing   Problem: Skin Integrity: Goal: Risk for impaired skin integrity will decrease Outcome: Progressing

## 2022-12-13 NOTE — Progress Notes (Signed)
PROGRESS NOTE  Roy [name] VFI:[id number] DOB: 02-25-1945   PCP: Irena Reichmann, DO  Patient is from: Home.  Lives with his wife.  DOA: 12/05/2022 LOS: 6  Chief complaints Chief Complaint  Patient presents with   Weakness     Brief Narrative / Interim history: 77 year old M with PMH of systolic CHF with ICD, CAD/CABG, remote testicular lymphoma s/p orchiectomy, CKD, DM-2, HTN and diminished hearing presented to ED with generalized weakness, falls, confusion and poor p.o. intake, and admitted for frequent PVCs, generalized weakness, falls and hypomagnesemia.  CT head showed right frontal mass.  MRI brain confirmed right frontal lobe mass suspicious for CNS lymphoma.  CT chest, abdomen and pelvis with no enlarged or progressive lymph nodes.  Oncology and radiation oncology consulted.  Patient was transferred to [facility] for further evaluation and care.   Underwent CT simulation and fluoroscopy guided lumbar puncture on 11/18.  CSF fluid with elevated glucose and protein.  CSF culture negative.  Cytology pending.  Subjective: Seen and examined earlier this morning.  No major events overnight of this morning.  Sleeping but wakes to voice.  No complaints.  Objective: Vitals:   12/12/22 2059 12/13/22 0503 12/13/22 1100 12/13/22 1305  BP: (!) 146/90 130/80  108/69  Pulse: 73 76  77  Resp: 14 14  18   Temp: 97.7 F (36.5 C)   98.6 F (37 C)  TempSrc: Oral   Oral  SpO2: 100% 99%  98%  Weight:   85.2 kg   Height:        Examination:  GENERAL: No apparent distress.  Nontoxic. HEENT: MMM.  Vision grossly intact.  Diminished hearing. NECK: Supple.  No apparent JVD.  RESP:  No IWOB.  Fair aeration bilaterally. CVS:  RRR. Heart sounds normal.  ABD/GI/GU: BS+. Abd soft, NTND.  MSK/EXT:  Moves extremities. No apparent deformity. No edema.  SKIN: no apparent skin lesion or wound NEURO: Awake, alert and oriented appropriately.  No apparent focal neuro deficit. PSYCH: Calm. Normal  affect.  Not engaging.  Focused on breakfast.  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  MRI brain showed 3.9 x 2.1 x 4.7 cm right frontal mass with additional smaller contrast-enhancing lesions in high right frontal lobe and near the posterior limb of internal capsule on the right.  CT chest, abdomen and pelvis with contrast without evidence of metastasis -Oncology and radiation oncology on board -Underwent simulation and lumbar puncture on 11/18.  CSF fluid with elevated glucose and protein.  Culture negative. -Oncology recs: Decadron 4 mg every 8 hrs for 3 days followed by every 12 hours until he completes radiation treatment -Follow CSF cytology.  Chronic systolic CHF/cardiomyopathy with ICD: TTE 09/2022 with LVEF of 25 to 30%, GH, G1DD.  On Jardiance, Aldactone, Toprol-XL and losartan at home.  Appears euvolemic on exam.  Denies respiratory symptoms. -Continue home medications -Strict intake and output, daily weight, renal functions and electrolytes  CKD-3B: Stable Recent Labs    07/08/22 1009 09/02/22 1000 11/07/22 1238 12/05/22 2134 12/06/22 0506 12/07/22 0213 12/08/22 0227 12/09/22 0811 12/11/22 1145 12/12/22 0550  BUN 28* 29* 23 13 12 17 14 17  45* 56*  CREATININE 2.02* 2.13* 1.44* 1.31* 1.41* 1.75* 1.49* 1.32* 1.69* 1.68*  -Continue monitoring  NIDDM-2 with hyperglycemia: A1c 6.2%.  Hyperglycemia partly due to steroid Recent Labs  Lab 12/12/22 1122 12/12/22 1624  12/12/22 2104 12/13/22 0713 12/13/22 1114  GLUCAP 285* 181* 269* 184* 291*  -Continue SSI moderate -Continue Semglee 10 units daily -Add NovoLog 3 units 3 times daily with meals -Add Semglee 10 units daily -Further adjustment as appropriate  Acute metabolic encephalopathy, multifactorial.  Seems to have resolved. 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 -Per oncology  CAD/CABG: Stable -Continue home meds   Hypokalemia/hypomagnesemia -Monitor replenish as appropriate  Generalized weakness/frequent falls/fall in the hospital: Patient "slid down to the floor into sitting position" when he got up to go to bathroom last night.  No fall with impact.  No injury.  CT head after fall without acute finding -Fall precaution in place -PT/OT   Essential hypertension: Normotensive -Continue Toprol-XL, losartan, Aldactone  PVCs: No ventricular tachycardia. -Continue Toprol-XL -Cardiology recommended outpatient follow-up and signed off. -Optimize electrolytes        Body mass index is 25.47 kg/m.         DVT prophylaxis:  enoxaparin (LOVENOX) injection 40 mg Start: 12/06/22 1600  Code Status: Full code Family Communication: None at bedside today. Level of care: Med-Surg Status is: Inpatient Remains inpatient appropriate because: Brain mass concerning for recurrence of lymphoma   Final disposition: SNF Consultants:  Oncology Cardiology  55 minutes with more than 50% spent in reviewing records, counseling patient/family and coordinating care.   Sch Meds:  Scheduled Meds:  aspirin EC  81 mg Oral Daily   atorvastatin  40 mg Oral Daily   dexamethasone  4 mg Oral Q8H   Followed by   Melene Muller ON 12/16/2022] dexamethasone  4 mg Oral Q12H   enoxaparin (LOVENOX) injection  40 mg Subcutaneous Q24H   famotidine  40 mg Oral Daily   feeding supplement  237 mL Oral BID BM   insulin aspart  0-15 Units Subcutaneous TID WC   insulin aspart  0-5 Units Subcutaneous QHS   insulin glargine-yfgn  10 Units Subcutaneous Daily   losartan  50 mg Oral Daily   metoprolol  200 mg Oral Daily   spironolactone  12.5 mg Oral Daily    Continuous Infusions: PRN Meds:.acetaminophen **OR** acetaminophen, ondansetron **OR** ondansetron (ZOFRAN) IV, mouth rinse  Antimicrobials: Anti-infectives (From admission, onward)    None        I have personally reviewed the following labs and images: CBC: Recent Labs  Lab 12/12/22 0550  WBC 17.8*  HGB 10.9*  HCT 34.0*  MCV 93.7  PLT 276   BMP &GFR Recent Labs  Lab 12/07/22 0213 12/08/22 0227 12/09/22 0811 12/11/22 1145 12/12/22 0550  NA 136 135 138 136 136  K 3.7 3.6 4.1 4.8 4.2  CL 108 107 110 106 109  CO2 18* 19* 18* 15* 18*  GLUCOSE 129* 124* 134* 258* 208*  BUN 17 14 17  45* 56*  CREATININE 1.75* 1.49* 1.32* 1.69* 1.68*  CALCIUM 8.4* 8.5* 9.1 9.7 9.1  MG 1.6* 2.3  --   --  1.9  PHOS  --   --   --   --  3.5   Estimated Creatinine Clearance: 39.1 mL/min (A) (by C-G formula based on SCr of 1.68 mg/dL (H)). Liver & Pancreas: Recent Labs  Lab 12/12/22 0550  ALBUMIN 3.3*   No results for input(s): "LIPASE", "AMYLASE" in the last 168 hours. No results for input(s): "AMMONIA" in the last 168 hours. Diabetic:  No results for input(s): "HGBA1C" in the last 72 hours. Recent Labs  Lab 12/12/22 1122 12/12/22 1624 12/12/22 2104 12/13/22 0713 12/13/22 1114  GLUCAP 285* 181* 269* 184* 291*   Cardiac Enzymes: No results for input(s): "CKTOTAL", "CKMB", "CKMBINDEX", "TROPONINI" in the last 168 hours. Recent Labs    09/02/22 1000  PROBNP 609*   Coagulation Profile: No results for input(s): "INR", "PROTIME" in the last 168 hours. Thyroid Function Tests: No results for input(s): "TSH", "T4TOTAL", "FREET4", "T3FREE", "THYROIDAB" in the last 72 hours. Lipid Profile: No results for input(s): "CHOL", "HDL", "LDLCALC", "TRIG", "CHOLHDL", "LDLDIRECT" in the last 72 hours. Anemia Panel: No results for input(s): "VITAMINB12", "FOLATE", "FERRITIN", "TIBC", "IRON", "RETICCTPCT" in the last 72 hours. Urine analysis:    Component Value Date/Time   COLORURINE  AMBER (A) 12/06/2022 0125   APPEARANCEUR HAZY (A) 12/06/2022 0125   LABSPEC 1.028 12/06/2022 0125   PHURINE 5.0 12/06/2022 0125   GLUCOSEU >=500 (A) 12/06/2022 0125   HGBUR NEGATIVE 12/06/2022 0125   BILIRUBINUR NEGATIVE 12/06/2022 0125   KETONESUR 5 (A) 12/06/2022 0125   PROTEINUR NEGATIVE 12/06/2022 0125   UROBILINOGEN 1.0 03/12/2013 1936   NITRITE NEGATIVE 12/06/2022 0125   LEUKOCYTESUR NEGATIVE 12/06/2022 0125   Sepsis Labs: Invalid input(s): "PROCALCITONIN", "LACTICIDVEN"  Microbiology: Recent Results (from the past 240 hour(s))  MRSA Next Gen by PCR, Nasal     Status: None   Collection Time: 12/06/22  2:49 PM   Specimen: Nasal Mucosa; Nasal Swab  Result Value Ref Range Status   MRSA by PCR Next Gen NOT DETECTED NOT DETECTED Final    Comment: (NOTE) The GeneXpert MRSA Assay (FDA approved for NASAL specimens only), is one component of a comprehensive MRSA colonization surveillance program. It is not intended to diagnose MRSA infection nor to guide or monitor treatment for MRSA infections. Test performance is not FDA approved in patients less than 37 years old. Performed at [facility], [address], [place], [place] [address]   CSF culture w Gram Stain     Status: None (Preliminary result)   Collection Time: 12/12/22  3:44 PM   Specimen: PATH Cytology CSF; Cerebrospinal Fluid  Result Value Ref Range Status   Specimen Description   Final    CSF Performed at [facility], [address], [place], [place] [address]    Special Requests   Final    NONE Performed at [facility], [address], [place], [place] [address]    Gram Stain   Final    NO WBC SEEN NO ORGANISMS SEEN CYTOSPIN SMEAR Performed at [facility], [address] [place], [place] [address]    Culture   Final    NO GROWTH < 12 HOURS Performed at [facility], [address] [place], [place] [address]    Report Status PENDING   Incomplete    Radiology Studies: DG FL GUIDED LUMBAR PUNCTURE  Result Date: 12/12/2022 CLINICAL DATA:  CNS lymphoma.  New intracranial mass. EXAM: DIAGNOSTIC LUMBAR PUNCTURE UNDER FLUOROSCOPIC GUIDANCE FLUOROSCOPY: Radiation Exposure Index (as provided by the fluoroscopic device): 10.8 mGy PROCEDURE: Informed consent was obtained from the patient prior to the procedure, including potential complications of headache, allergy, brain herniation and pain. With the patient prone, the lower back was prepped with Betadine. 1% Lidocaine was used for local anesthesia. Lumbar puncture was performed at the L3-L4 level using a 20 gauge needle with return of clear CSF with an opening pressure of  15 cm water. Ten ml of CSF were obtained for laboratory studies. The patient tolerated the procedure well and there were no apparent complications. IMPRESSION: Successful lumbar puncture. Electronically Signed   By: Genevive Bi M.D.   On: 12/12/2022 16:53      Allia Wiltsey T. Shila Kruczek [facility]  If 7PM-7AM, please contact night-coverage [email or web address] 12/13/2022, 1:12 PM

## 2022-12-13 NOTE — Progress Notes (Signed)
Physical Therapy Treatment Patient Details Name: Roy [name] MRN: [id number] DOB: [birth year] Today's Date: 12/13/2022   History of Present Illness Pt is a 77 y/o admitted 12/05/22 for weakness and slipping out of chair at home unable to stand up.  Noted on AICD severe spike in PVC and pt with hypomagnesemia. 11/13 head CT with Rt frontal lobe mass. PMH - testicular CA s/p orchiecetomy, large B cell lymphoma, HTN, HLD, DM, CAD s/p CABG x 4 2022, CHF, cardiomyopathy s/p ICD, recurrent syncope.    PT Comments   Pt admitted with above diagnosis.  Pt currently with functional limitations due to the deficits listed below (see PT Problem List). Pt in bed when PT arrived. Pt agreeable to therapy intervention. Pt required assist to don hearing aids. Pt required increased time for motor processing and planing. Pt denies pain, dizziness or SOB with noted fatigued with functional mobility tasks and O2 saturation 95-99% on RA. Pt required mod A for supine to sit, min A for sit to stand  from EOB with cues and gait tasks 60 feet with RW, CGA and cues. Pt left seated in recliner and all needs in place.  D/c plan remains appropriate,patient will benefit from continued inpatient follow up therapy, <3 hours/day.  Pt will benefit from acute skilled PT to increase their independence and safety with mobility to allow discharge.      If plan is discharge home, recommend the following: A lot of help with walking and/or transfers;A lot of help with bathing/dressing/bathroom;Assistance with cooking/housework;Assist for transportation;Help with stairs or ramp for entrance   Can travel by private vehicle     Yes  Equipment Recommendations  None recommended by PT    Recommendations for Other Services       Precautions / Restrictions Precautions Precautions: Fall Restrictions Weight Bearing Restrictions: No     Mobility  Bed Mobility Overal bed mobility: Needs Assistance Bed Mobility: Supine to Sit      Supine to sit: Mod assist     General bed mobility comments: HOB elevated and use of bed pad for trunk flexion    Transfers Overall transfer level: Needs assistance Equipment used: Rolling walker (2 wheels) Transfers: Sit to/from Stand Sit to Stand: Min assist, From elevated surface           General transfer comment: min cues for posture and safety    Ambulation/Gait Ambulation/Gait assistance: Contact guard assist Gait Distance (Feet): 60 Feet Assistive device: Rolling walker (2 wheels) Gait Pattern/deviations: Step-to pattern, Decreased dorsiflexion - left, Shuffle Gait velocity: decreased     General Gait Details: pt required cues for L foot clearance and able to perform return demonstration with prolonged gait   Stairs             Wheelchair Mobility     Tilt Bed    Modified Rankin (Stroke Patients Only)       Balance Overall balance assessment: Needs assistance, History of Falls (pt sustained fall at hospital 11/18 when attempting bed to bathroom no AD nor staff assist) Sitting-balance support: Feet supported Sitting balance-Leahy Scale: Good     Standing balance support: Reliant on assistive device for balance, Bilateral upper extremity supported, During functional activity Standing balance-Leahy Scale: Poor Standing balance comment: B UE support at RW wtih min cues for posture and no overt LOB                            Cognition  Arousal: Alert Behavior During Therapy: WFL for tasks assessed/performed Overall Cognitive Status: Within Functional Limits for tasks assessed Area of Impairment: Orientation                 Orientation Level: Place Current Attention Level: Selective Memory: Decreased short-term memory Following Commands: Follows one step commands consistently, Follows multi-step commands inconsistently Safety/Judgement: Decreased awareness of deficits, Decreased awareness of safety Awareness: Emergent Problem  Solving: Decreased initiation General Comments: Pt reported correct day of week, month, and year, for date he pointed at the calander on the wall, correctly answered current president. Pt reported "the man" earlier said he was going to the hospital later for a CT scan        Exercises      General Comments        Pertinent Vitals/Pain Pain Assessment Pain Assessment: No/denies pain    Home Living Family/patient expects to be discharged to:: Private residence Living Arrangements: Spouse/significant other Available Help at Discharge: Family;Available 24 hours/day Type of Home: House Home Access: Stairs to enter   [employer] of Steps: 1   Home Layout: One level Home Equipment: Cane - single point;Other (comment);Wheelchair - Biomedical scientist (2 wheels) Additional Comments: wife (scheduled for watchman implant on 11/22) but normally helps for safety with mobility since pt has declined past several months    Prior Function            PT Goals (current goals can now be found in the care plan section) Acute Rehab PT Goals Patient Stated Goal: regain mobility and balance, stop falling PT Goal Formulation: With patient/family Time For Goal Achievement: 12/20/22 Potential to Achieve Goals: Fair Progress towards PT goals: Progressing toward goals    Frequency    Min 1X/week      PT Plan      Co-evaluation              AM-PAC PT "6 Clicks" Mobility   Outcome Measure  Help needed turning from your back to your side while in a flat bed without using bedrails?: A Little Help needed moving from lying on your back to sitting on the side of a flat bed without using bedrails?: A Lot Help needed moving to and from a bed to a chair (including a wheelchair)?: A Little Help needed standing up from a chair using your arms (e.g., wheelchair or bedside chair)?: A Little Help needed to walk in hospital room?: A Little Help needed climbing 3-5 steps  with a railing? : Total 6 Click Score: 15    End of Session Equipment Utilized During Treatment: Gait belt Activity Tolerance: Patient limited by fatigue Patient left: with nursing/sitter in room;in chair;with call bell/phone within reach;with chair alarm set Nurse Communication: Mobility status PT Visit Diagnosis: Other abnormalities of gait and mobility (R26.89);History of falling (Z91.81);Muscle weakness (generalized) (M62.81)     Time: 1610-9604 PT Time Calculation (min) (ACUTE ONLY): 16 min  Charges:    $Gait Training: 8-22 mins PT General Charges $$ ACUTE PT VISIT: 1 Visit                     [name], PT Acute Rehab    [name] 12/13/2022, 12:05 PM

## 2022-12-13 NOTE — Progress Notes (Signed)
Remote ICD transmission.   

## 2022-12-14 ENCOUNTER — Ambulatory Visit
Admit: 2022-12-14 | Discharge: 2022-12-14 | Disposition: A | Discharge status: Home / Self Care | Payer: Medicare HMO | Attending: Radiation Oncology | Admitting: Radiation Oncology

## 2022-12-14 ENCOUNTER — Other Ambulatory Visit: Payer: Self-pay

## 2022-12-14 DIAGNOSIS — C8339 Primary central nervous system lymphoma: Secondary | ICD-10-CM | POA: Diagnosis not present

## 2022-12-14 DIAGNOSIS — G9389 Other specified disorders of brain: Secondary | ICD-10-CM | POA: Diagnosis not present

## 2022-12-14 DIAGNOSIS — N1832 Chronic kidney disease, stage 3b: Secondary | ICD-10-CM | POA: Diagnosis not present

## 2022-12-14 DIAGNOSIS — C8330 Diffuse large B-cell lymphoma, unspecified site: Secondary | ICD-10-CM | POA: Diagnosis not present

## 2022-12-14 LAB — RAD ONC ARIA SESSION SUMMARY
Course Elapsed Days: 0
Plan Fractions Treated to Date: 1
Plan Prescribed Dose Per Fraction: 2.3 Gy
Plan Total Fractions Prescribed: 17
Plan Total Prescribed Dose: 39.1 Gy
Reference Point Dosage Given to Date: 2.3 Gy
Reference Point Session Dosage Given: 2.3 Gy
Session Number: 1

## 2022-12-14 LAB — CBC
HCT: 36.8 % — ABNORMAL LOW (ref 39.0–52.0)
Hemoglobin: 11.5 g/dL — ABNORMAL LOW (ref 13.0–17.0)
MCH: 29.6 pg (ref 26.0–34.0)
MCHC: 31.3 g/dL (ref 30.0–36.0)
MCV: 94.8 fL (ref 80.0–100.0)
Platelets: 286 10*3/uL (ref 150–400)
RBC: 3.88 MIL/uL — ABNORMAL LOW (ref 4.22–5.81)
RDW: 12.6 % (ref 11.5–15.5)
WBC: 15.9 10*3/uL — ABNORMAL HIGH (ref 4.0–10.5)
nRBC: 0 % (ref 0.0–0.2)

## 2022-12-14 LAB — GLUCOSE, CAPILLARY
Glucose-Capillary: 180 mg/dL — ABNORMAL HIGH (ref 70–99)
Glucose-Capillary: 274 mg/dL — ABNORMAL HIGH (ref 70–99)
Glucose-Capillary: 141 mg/dL — ABNORMAL HIGH (ref 70–99)
Glucose-Capillary: 196 mg/dL — ABNORMAL HIGH (ref 70–99)

## 2022-12-14 LAB — CD19 AND CD20, FLOW CYTOMETRY
% CD19: 44.1 % — ABNORMAL HIGH (ref 4.6–22.1)
% CD20: 44.1 % — ABNORMAL HIGH (ref 5.0–22.3)

## 2022-12-14 LAB — RENAL FUNCTION PANEL
Albumin: 3.4 g/dL — ABNORMAL LOW (ref 3.5–5.0)
Anion gap: 9 (ref 5–15)
BUN: 56 mg/dL — ABNORMAL HIGH (ref 8–23)
CO2: 19 mmol/L — ABNORMAL LOW (ref 22–32)
Calcium: 9.1 mg/dL (ref 8.9–10.3)
Chloride: 109 mmol/L (ref 98–111)
Creatinine, Ser: 1.46 mg/dL — ABNORMAL HIGH (ref 0.61–1.24)
GFR, Estimated: 49 mL/min — ABNORMAL LOW (ref >60)
Glucose, Bld: 211 mg/dL — ABNORMAL HIGH (ref 70–99)
Phosphorus: 3 mg/dL (ref 2.5–4.6)
Potassium: 4.7 mmol/L (ref 3.5–5.1)
Sodium: 137 mmol/L (ref 135–145)

## 2022-12-14 LAB — CYTOLOGY - NON PAP

## 2022-12-14 LAB — MAGNESIUM: Magnesium: 2 mg/dL (ref 1.7–2.4)

## 2022-12-14 MED ORDER — INSULIN ASPART 100 UNIT/ML IJ SOLN
5.0000 [IU] | Freq: Three times a day (TID) | INTRAMUSCULAR | Status: DC
  Administered 2022-12-14 – 2022-12-27 (×39): 5 [IU] via SUBCUTANEOUS

## 2022-12-14 MED ORDER — INSULIN GLARGINE-YFGN 100 UNIT/ML ~~LOC~~ SOLN
15.0000 [IU] | Freq: Every day | SUBCUTANEOUS | Status: DC
  Administered 2022-12-15 – 2022-12-18 (×4): 15 [IU] via SUBCUTANEOUS
  Filled 2022-12-14 (×4): qty 0.15

## 2022-12-14 NOTE — Progress Notes (Signed)
PROGRESS NOTE  Roy [name] MWU:[id number] DOB: [birth year]   PCP: Irena Reichmann, DO  Patient is from: Home.  Lives with his wife.  DOA: 12/05/2022 LOS: 7  Chief complaints Chief Complaint  Patient presents with   Weakness     Brief Narrative / Interim history: 77 year old M with PMH of systolic CHF with ICD, CAD/CABG, remote testicular lymphoma s/p orchiectomy, CKD, DM-2, HTN and diminished hearing presented to ED with generalized weakness, falls, confusion and poor p.o. intake, and admitted for frequent PVCs, generalized weakness, falls and hypomagnesemia.  CT head showed right frontal mass.  MRI brain confirmed right frontal lobe mass suspicious for CNS lymphoma.  CT chest, abdomen and pelvis with no enlarged or progressive lymph nodes.  Oncology and radiation oncology consulted.  Patient was transferred to [facility] for further evaluation and care.   Underwent CT simulation and fluoroscopy guided lumbar puncture on 11/18.  CSF fluid with elevated glucose and protein.  CSF culture negative.  Cytology pending.  Subjective: Seen and examined earlier this morning.  No major events overnight of this morning.  No complaints.  Like to go home and come back stating that he has some business  to take care at home.  Objective: Vitals:   12/13/22 1305 12/13/22 2058 12/14/22 0540 12/14/22 1418  BP: 108/69 121/76 (!) 142/83 135/78  Pulse: 77 70 65 65  Resp: 18 16 16 16   Temp: 98.6 F (37 C) (!) 97.4 F (36.3 C) 97.7 F (36.5 C) (!) 97.3 F (36.3 C)  TempSrc: Oral Oral Oral Oral  SpO2: 98% 98% 98% 100%  Weight:      Height:        Examination:  GENERAL: No apparent distress.  Nontoxic. HEENT: MMM.  Vision grossly intact.  Diminished hearing. NECK: Supple.  No apparent JVD.  RESP:  No IWOB.  Fair aeration bilaterally. CVS:  RRR. Heart sounds normal.  ABD/GI/GU: BS+. Abd soft, NTND.  MSK/EXT:  Moves extremities. No apparent deformity. No edema.  SKIN: no apparent skin lesion or  wound NEURO: Awake, alert and oriented appropriately.  No apparent focal neuro deficit. PSYCH: Calm. Normal affect.  Not engaging.  Focused on breakfast.  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  MRI brain showed 3.9 x 2.1 x 4.7 cm right frontal mass with additional smaller contrast-enhancing lesions in high right frontal lobe and near the posterior limb of internal capsule on the right.  CT chest, abdomen and pelvis with contrast without evidence of metastasis -Oncology and radiation oncology on board -Underwent simulation and lumbar puncture on 11/18.   -CSF fluid with elevated glucose and protein.  Culture negative.  Cytology pending -Oncology recs: Decadron 4 mg Q8h for 3 days>>Q12h  until he completes radiation treatment -Follow-up MRI spine   Chronic systolic CHF/cardiomyopathy with ICD: TTE 09/2022 with LVEF of 25 to 30%, GH, G1DD.  On Jardiance, Aldactone, Toprol-XL and losartan at home.  Appears euvolemic on exam.  Denies respiratory symptoms. -Continue home medications -Strict intake and output, daily weight, renal functions and electrolytes  CKD-3B: Stable Recent Labs    09/02/22 1000 11/07/22 1238 12/05/22 2134 12/06/22 0506 12/07/22 0213 12/08/22 0227 12/09/22 0811 12/11/22 1145 12/12/22 0550 12/14/22 0548  BUN 29* 23 13 12 17 14 17  45* 56* 56*  CREATININE 2.13* 1.44* 1.31* 1.41* 1.75* 1.49* 1.32* 1.69* 1.68* 1.46*  -Continue monitoring  NIDDM-2 with hyperglycemia: A1c 6.2%.  Hyperglycemia partly due to steroid Recent Labs  Lab 12/13/22 1114 12/13/22 1603 12/13/22 2125 12/14/22 0734 12/14/22 1107  GLUCAP 291* 294* 209* 180* 274*  -Continue SSI moderate -Continue Semglee 10 units daily, and increase to 15 units on 11/21 -Increase NovoLog from 3 to 5 units 3 times daily with  meals -Further adjustment as appropriate  Acute metabolic encephalopathy, multifactorial.  Seems to have resolved. 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 -Per oncology  CAD/CABG: Stable -Continue home meds   Hypokalemia/hypomagnesemia -Monitor replenish as appropriate  Generalized weakness/frequent falls/fall in the hospital: Patient "slid down to the floor into sitting position" when he got up to go to bathroom last night.  No fall with impact.  No injury.  CT head after fall without acute finding -Fall precaution in place -PT/OT   Essential hypertension: Normotensive -Continue Toprol-XL, losartan, Aldactone  PVCs: No ventricular tachycardia. -Continue Toprol-XL -Cardiology recommended outpatient follow-up and signed off. -Optimize electrolytes        Body mass index is 25.47 kg/m.         DVT prophylaxis:  enoxaparin (LOVENOX) injection 40 mg Start: 12/06/22 1600  Code Status: Full code Family Communication: None at bedside today. Level of care: Med-Surg Status is: Inpatient Remains inpatient appropriate because: Brain mass concerning for recurrence of lymphoma   Final disposition: SNF Consultants:  Oncology Cardiology  35 minutes with more than 50% spent in reviewing records, counseling patient/family and coordinating care.   Sch Meds:  Scheduled Meds:  aspirin EC  81 mg Oral Daily   atorvastatin  40 mg Oral Daily   dexamethasone  4 mg Oral Q8H   Followed by   Melene Muller ON 12/16/2022] dexamethasone  4 mg Oral Q12H   enoxaparin (LOVENOX) injection  40 mg Subcutaneous Q24H   famotidine  40 mg Oral Daily   feeding supplement  237 mL Oral BID BM   insulin aspart  0-15 Units Subcutaneous TID WC   insulin aspart  0-5 Units Subcutaneous QHS   insulin aspart  3 Units  Subcutaneous TID WC   insulin glargine-yfgn  10 Units Subcutaneous Daily   losartan  50 mg Oral Daily   metoprolol  200 mg Oral Daily   spironolactone  12.5 mg Oral Daily   Continuous Infusions: PRN Meds:.acetaminophen **OR** acetaminophen, ondansetron **OR** ondansetron (ZOFRAN) IV, mouth rinse  Antimicrobials: Anti-infectives (From admission, onward)    None        I have personally reviewed the following labs and images: CBC: Recent Labs  Lab 12/12/22 0550 12/14/22 0548  WBC 17.8* 15.9*  HGB 10.9* 11.5*  HCT 34.0* 36.8*  MCV 93.7 94.8  PLT 276 286   BMP &GFR Recent Labs  Lab 12/08/22 0227 12/09/22 0811 12/11/22 1145 12/12/22 0550 12/14/22 0548  NA 135 138 136 136 137  K 3.6 4.1 4.8 4.2 4.7  CL 107 110 106 109 109  CO2 19* 18* 15* 18* 19*  GLUCOSE 124* 134* 258* 208* 211*  BUN 14 17 45* 56* 56*  CREATININE 1.49* 1.32* 1.69* 1.68* 1.46*  CALCIUM 8.5* 9.1 9.7 9.1 9.1  MG 2.3  --   --  1.9 2.0  PHOS  --   --   --  3.5 3.0   Estimated Creatinine Clearance: 45 mL/min (A) (by C-G formula based on SCr of  1.46 mg/dL (H)). Liver & Pancreas: Recent Labs  Lab 12/12/22 0550 12/14/22 0548  ALBUMIN 3.3* 3.4*   No results for input(s): "LIPASE", "AMYLASE" in the last 168 hours. No results for input(s): "AMMONIA" in the last 168 hours. Diabetic: No results for input(s): "HGBA1C" in the last 72 hours. Recent Labs  Lab 12/13/22 1114 12/13/22 1603 12/13/22 2125 12/14/22 0734 12/14/22 1107  GLUCAP 291* 294* 209* 180* 274*   Cardiac Enzymes: No results for input(s): "CKTOTAL", "CKMB", "CKMBINDEX", "TROPONINI" in the last 168 hours. Recent Labs    09/02/22 1000  PROBNP 609*   Coagulation Profile: No results for input(s): "INR", "PROTIME" in the last 168 hours. Thyroid Function Tests: No results for input(s): "TSH", "T4TOTAL", "FREET4", "T3FREE", "THYROIDAB" in the last 72 hours. Lipid Profile: No results for input(s): "CHOL", "HDL", "LDLCALC", "TRIG",  "CHOLHDL", "LDLDIRECT" in the last 72 hours. Anemia Panel: No results for input(s): "VITAMINB12", "FOLATE", "FERRITIN", "TIBC", "IRON", "RETICCTPCT" in the last 72 hours. Urine analysis:    Component Value Date/Time   COLORURINE AMBER (A) 12/06/2022 0125   APPEARANCEUR HAZY (A) 12/06/2022 0125   LABSPEC 1.028 12/06/2022 0125   PHURINE 5.0 12/06/2022 0125   GLUCOSEU >=500 (A) 12/06/2022 0125   HGBUR NEGATIVE 12/06/2022 0125   BILIRUBINUR NEGATIVE 12/06/2022 0125   KETONESUR 5 (A) 12/06/2022 0125   PROTEINUR NEGATIVE 12/06/2022 0125   UROBILINOGEN 1.0 03/12/2013 1936   NITRITE NEGATIVE 12/06/2022 0125   LEUKOCYTESUR NEGATIVE 12/06/2022 0125   Sepsis Labs: Invalid input(s): "PROCALCITONIN", "LACTICIDVEN"  Microbiology: Recent Results (from the past 240 hour(s))  MRSA Next Gen by PCR, Nasal     Status: None   Collection Time: 12/06/22  2:49 PM   Specimen: Nasal Mucosa; Nasal Swab  Result Value Ref Range Status   MRSA by PCR Next Gen NOT DETECTED NOT DETECTED Final    Comment: (NOTE) The GeneXpert MRSA Assay (FDA approved for NASAL specimens only), is one component of a comprehensive MRSA colonization surveillance program. It is not intended to diagnose MRSA infection nor to guide or monitor treatment for MRSA infections. Test performance is not FDA approved in patients less than 1 years old. Performed at [facility], [address] [place], [place] [address]   CSF culture w Gram Stain     Status: None (Preliminary result)   Collection Time: 12/12/22  3:44 PM   Specimen: PATH Cytology CSF; Cerebrospinal Fluid  Result Value Ref Range Status   Specimen Description   Final    CSF Performed at [facility], [address], [place], [place] [address]    Special Requests   Final    NONE Performed at [facility], [address] [place], [place] [address]    Gram Stain   Final    NO WBC SEEN NO ORGANISMS SEEN CYTOSPIN  SMEAR Performed at [facility], [address], [place], [place] [address]    Culture   Final    NO GROWTH 2 DAYS Performed at [facility], [address] [place], [place] [address]    Report Status PENDING  Incomplete    Radiology Studies: No results found.    Coron Rossano T. Romar Woodrick [facility]  If 7PM-7AM, please contact night-coverage [email or web address] 12/14/2022, 2:28 PM

## 2022-12-14 NOTE — Progress Notes (Signed)
Physical Therapy Treatment Patient Details Name: Roy [name] MRN: [id number] DOB: 06-15-1945 Today's Date: 12/14/2022   History of Present Illness Pt is a 77 y/o admitted 12/05/22 for weakness and slipping out of chair at home unable to stand up.  Noted on AICD severe spike in PVC and pt with hypomagnesemia. 11/13 head CT with Rt frontal lobe mass. PMH - testicular CA s/p orchiecetomy, large B cell lymphoma, HTN, HLD, DM, CAD s/p CABG x 4 2022, CHF, cardiomyopathy s/p ICD, recurrent syncope.    PT Comments   Pt admitted with above diagnosis.  Pt currently with functional limitations due to the deficits listed below (see PT Problem List). Pt in bed when PT arrived. Pt agreeable to therapy intervention, no reports of pain, dizziness or SOB. Pt demonstrating increased IND and initiation with functional mobility tasks today. Min A for bed mobility, CGA for sit to stand  from EOB and from commode with cues, gait tasks in personal room 15 feet and in hallway 55 with RW, cues for L foot clearance and extension posture. Pt left seated in recliner all needs in place as PT exiting room wife arrived and able to provide update on pt progress with therapy at this time. Pt d/c plan remains appropriate. Pt will benefit from acute skilled PT to increase their independence and safety with mobility to allow discharge.  '   If plan is discharge home, recommend the following: Assistance with cooking/housework;Assist for transportation;Help with stairs or ramp for entrance;A little help with walking and/or transfers;A little help with bathing/dressing/bathroom   Can travel by private vehicle     Yes  Equipment Recommendations  None recommended by PT    Recommendations for Other Services       Precautions / Restrictions Precautions Precautions: Fall Restrictions Weight Bearing Restrictions: No     Mobility  Bed Mobility Overal bed mobility: Needs Assistance Bed Mobility: Supine to Sit     Supine to  sit: Min assist, HOB elevated, Used rails     General bed mobility comments: HOB elevated pt demonstrated increased IND with initiation and movement to EOB with B LE and trunk flexion as well as scooting with CGA to EOB    Transfers Overall transfer level: Needs assistance Equipment used: Rolling walker (2 wheels) Transfers: Sit to/from Stand Sit to Stand: From elevated surface, Contact guard assist           General transfer comment: min cues for posture and safety as well as placement of RW for commode transfer    Ambulation/Gait Ambulation/Gait assistance: Contact guard assist Gait Distance (Feet): 55 Feet (15 in room bed to bathroom) Assistive device: Rolling walker (2 wheels) Gait Pattern/deviations: Step-to pattern, Decreased dorsiflexion - left, Shuffle Gait velocity: decreased     General Gait Details: pt required cues for L foot clearance and able to perform return demonstration with prolonged gait, cues for extension posture and proper head position for improved visual scanning and attention   Stairs             Wheelchair Mobility     Tilt Bed    Modified Rankin (Stroke Patients Only)       Balance Overall balance assessment: Needs assistance, History of Falls (pt sustained fall at hospital 11/18 when attempting bed to bathroom no AD nor staff assist) Sitting-balance support: Feet supported Sitting balance-Leahy Scale: Good     Standing balance support: Reliant on assistive device for balance, Bilateral upper extremity supported, During functional activity Standing balance-Leahy  Scale: Poor Standing balance comment: 1 UE support at RW wtih min cues for posture and no overt LOB                            Cognition Arousal: Alert Behavior During Therapy: WFL for tasks assessed/performed Overall Cognitive Status: Within Functional Limits for tasks assessed Area of Impairment: Orientation                 Orientation Level:  Place   Memory: Decreased short-term memory Following Commands: Follows one step commands consistently, Follows multi-step commands inconsistently Safety/Judgement: Decreased awareness of deficits, Decreased awareness of safety   Problem Solving: Requires verbal cues, Slow processing          Exercises      General Comments        Pertinent Vitals/Pain Pain Assessment Faces Pain Scale: Hurts little more Pain Location: ribs on Lt side Pain Descriptors / Indicators: Discomfort    Home Living                          Prior Function            PT Goals (current goals can now be found in the care plan section) Acute Rehab PT Goals Patient Stated Goal: regain mobility and balance, stop falling PT Goal Formulation: With patient/family Time For Goal Achievement: 12/20/22 Potential to Achieve Goals: Fair Progress towards PT goals: Progressing toward goals    Frequency    Min 1X/week      PT Plan      Co-evaluation              AM-PAC PT "6 Clicks" Mobility   Outcome Measure  Help needed turning from your back to your side while in a flat bed without using bedrails?: A Little Help needed moving from lying on your back to sitting on the side of a flat bed without using bedrails?: A Little Help needed moving to and from a bed to a chair (including a wheelchair)?: A Little Help needed standing up from a chair using your arms (e.g., wheelchair or bedside chair)?: A Little Help needed to walk in hospital room?: A Little Help needed climbing 3-5 steps with a railing? : Total 6 Click Score: 16    End of Session Equipment Utilized During Treatment: Gait belt Activity Tolerance: Patient limited by fatigue Patient left: in chair;with call bell/phone within reach;with chair alarm set Nurse Communication: Mobility status PT Visit Diagnosis: Other abnormalities of gait and mobility (R26.89);History of falling (Z91.81);Muscle weakness (generalized)  (M62.81)     Time: 5784-6962 PT Time Calculation (min) (ACUTE ONLY): 24 min  Charges:    $Gait Training: 8-22 mins $Therapeutic Activity: 8-22 mins PT General Charges $$ ACUTE PT VISIT: 1 Visit                     [name], PT Acute Rehab    [name] 12/14/2022, 12:22 PM

## 2022-12-14 NOTE — Progress Notes (Signed)
HEMATOLOGY/ONCOLOGY INPATIENT PROGRESS NOTE  Date of Service: 12/14/2022  Inpatient Attending: .Almon Hercules, MD   SUBJECTIVE  Patient with h/o  Left-sided primary testicular large B-cell lymphoma diagnosed 02/2020. S/p ipsilateral orchiectomy S/p 6 cycles of R-CEOP and 3 dose of IT MTX (for CNS prophylaxis) and R Tto contralateral testis. BCL-2, BCL 6, c-Myc negative. Patient was in remission until recently.  He was seen by me on 12/12/2022 as an inpatient and noted improvement in upper and lower extremity strength.   Patient denies any headaches, back pain, or leg pain. He has been eating and sleeping well. Patient has been working with physical therapy and is demonstrating some functional improvement and has recommendations to consider acute inpatient rehab,   OBJECTIVE:  NAD  PHYSICAL EXAMINATION: . Vitals:   12/13/22 1305 12/13/22 2058 12/14/22 0540 12/14/22 1418  BP: 108/69 121/76 (!) 142/83 135/78  Pulse: 77 70 65 65  Resp: 18 16 16 16   Temp: 98.6 F (37 C) (!) 97.4 F (36.3 C) 97.7 F (36.5 C) (!) 97.3 F (36.3 C)  TempSrc: Oral Oral Oral Oral  SpO2: 98% 98% 98% 100%  Weight:      Height:       Filed Weights   12/07/22 0417 12/09/22 0506 12/13/22 1100  Weight: 90.7 kg 89.6 kg 85.2 kg   .Body mass index is 25.47 kg/m.  GENERAL:alert, in no acute distress and comfortable LYMPH:  no palpable lymphadenopathy in the cervical, axillary or inguinal LUNGS: clear to auscultation with normal respiratory effort HEART: regular rate & rhythm,  no murmurs and no lower extremity edema ABDOMEN: abdomen soft, non-tender, normoactive bowel sounds   MEDICAL HISTORY:  Past Medical History:  Diagnosis Date   Allergic rhinitis    Arthritis    wrists   Cardiomyopathy, nonischemic (HCC)    followed by cardiology--- dr Delton See---  2016 ef 45-50% ,  2016 nuclear ef 37%,  2017 per echo ef 50-55%   CHF (congestive heart failure), NYHA class III (HCC) 03/21/2020   Coronary  artery disease    GERD (gastroesophageal reflux disease)    Hiatal hernia    History of kidney stones    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;   01/ 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   History of syncope (03-06-2020 pt stated has not had sycopal episode in few years, stated it seems to happen in extreme hot conditions)   cardiologist--- dr Eloy End--- dx recurrent syncope;  nuclear study 11-03-2014 intermediate risk w/ no ischemia, apical hypokinesis, nuclear ef 37%;  event monitor-- 12-21-2015 SB/ ST  no pauses/ arrythmia's;  echo 08-03-2015 ef 50-55%   Hyperlipidemia    Hypertension    followed by pcp   Hypovitaminosis D    Mass of left testicle    Nocturia    Plantar fasciitis    Presence of surgical incision    01/ 2022  moh's w/ skin graft of nose, per pt still healing and wear bandage daily   Type 2 diabetes mellitus (HCC)    pt is adament that he is not and have been told he is a diabetic but a borderline;  followed by pcp, in pcp note states DM2 and takes 2 meds daily   Wears glasses    Wears hearing aid in both ears    Weight loss 11/16/2020    SURGICAL HISTORY: Past Surgical History:  Procedure Laterality Date   COLONOSCOPY  last  one 01-30-2017   CORONARY ARTERY BYPASS GRAFT N/A 03/25/2020   Procedure: CORONARY ARTERY BYPASS GRAFTING (CABG), ON PUMP, TIMES FOUR, USING BILATERAL INTERNAL MAMMARY ARTERIES AND LEFT RADIAL ARTERY;  Surgeon: Linden Dolin, MD;  Location: [facility] OR;  Service: Open Heart Surgery;  Laterality: N/A;   ICD IMPLANT N/A 08/23/2021   Procedure: ICD IMPLANT;  Surgeon: Marinus Maw, MD;  Location: [facility] INVASIVE CV LAB;  Service: Cardiovascular;  Laterality: N/A;   IR IMAGING GUIDED PORT INSERTION  07/06/2020   LOW ANTERIOR RESECTION RECTUM W/ COLOPROCTOSTOMY  05/2003   MOHS SURGERY  01/2020   nose w/ graft   ORCHIECTOMY Left 03/11/2020   Procedure: Clearance Coots;  Surgeon: Rene Paci, MD;   Location: [facility];  Service: Urology;  Laterality: Left;  ONLY NEEDS 60 MIN   PORTA CATH REMOVAL N/A 08/23/2021   Procedure: PORTA CATH REMOVAL;  Surgeon: Marinus Maw, MD;  Location: [facility] INVASIVE CV LAB;  Service: Cardiovascular;  Laterality: N/A;   RADIAL ARTERY HARVEST Left 03/25/2020   Procedure: LEFT RADIAL ARTERY HARVEST;  Surgeon: Linden Dolin, MD;  Location: [facility] OR;  Service: Open Heart Surgery;  Laterality: Left;   RIGHT/LEFT HEART CATH AND CORONARY ANGIOGRAPHY N/A 03/23/2020   Procedure: RIGHT/LEFT HEART CATH AND CORONARY ANGIOGRAPHY;  Surgeon: Corky Crafts, MD;  Location: [facility] INVASIVE CV LAB;  Service: Cardiovascular;  Laterality: N/A;   SHOULDER SURGERY Right 1992; 07/ 2021   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 Left 12/24/2008   left ear and nose    TEE WITHOUT CARDIOVERSION N/A 03/25/2020   Procedure: TRANSESOPHAGEAL ECHOCARDIOGRAM (TEE);  Surgeon: Linden Dolin, MD;  Location: [facility] OR;  Service: Open Heart Surgery;  Laterality: N/A;   TONSILLECTOMY AND ADENOIDECTOMY  child   UPPER GASTROINTESTINAL ENDOSCOPY  last one 06-06-2017    SOCIAL HISTORY: Social History   Socioeconomic History   Marital status: Married    Spouse name: Not on file   Number of children: Not on file   Years of education: Not on file   Highest education level: Not on file  Occupational History   Not on file  Tobacco Use   Smoking status: Former    Types: Pipe    Quit date: 11/29/1976    Years since quitting: 46.0   Smokeless tobacco: Never  Vaping Use   Vaping status: Never Used  Substance and Sexual Activity   Alcohol use: Yes    Alcohol/week: 0.0 standard drinks of alcohol    Comment: Occasional drink    Drug use: Never   Sexual activity: Not on file  Other Topics Concern   Not on file  Social History Narrative   Not on file   Social Determinants of Health   Financial Resource Strain: Not on file  Food Insecurity: No Food Insecurity  (12/06/2022)   Hunger Vital Sign    Worried About Running Out of Food in the Last Year: Never true    Ran Out of Food in the Last Year: Never true  Transportation Needs: No Transportation Needs (12/06/2022)   PRAPARE - Administrator, Civil Service (Medical): No    Lack of Transportation (Non-Medical): No  Physical Activity: Not on file  Stress: Not on file  Social Connections: Not on file  Intimate Partner Violence: Not At Risk (12/06/2022)   Humiliation, Afraid, Rape, and Kick questionnaire    Fear of Current or Ex-Partner: No    Emotionally Abused: No  Physically Abused: No    Sexually Abused: No    FAMILY HISTORY: Family History  Problem Relation Age of Onset   Heart attack Mother    Stroke Father    Colon cancer Neg Hx    Esophageal cancer Neg Hx    Pancreatic cancer Neg Hx    Rectal cancer Neg Hx    Stomach cancer Neg Hx    Colon polyps Neg Hx     ALLERGIES:  has No Known Allergies.  MEDICATIONS:  Scheduled Meds:  aspirin EC  81 mg Oral Daily   atorvastatin  40 mg Oral Daily   dexamethasone  4 mg Oral Q8H   Followed by   Melene Muller ON 12/16/2022] dexamethasone  4 mg Oral Q12H   enoxaparin (LOVENOX) injection  40 mg Subcutaneous Q24H   famotidine  40 mg Oral Daily   feeding supplement  237 mL Oral BID BM   insulin aspart  0-15 Units Subcutaneous TID WC   insulin aspart  0-5 Units Subcutaneous QHS   insulin aspart  5 Units Subcutaneous TID WC   [START ON 12/15/2022] insulin glargine-yfgn  15 Units Subcutaneous Daily   losartan  50 mg Oral Daily   metoprolol  200 mg Oral Daily   spironolactone  12.5 mg Oral Daily   Continuous Infusions: PRN Meds:.acetaminophen **OR** acetaminophen, ondansetron **OR** ondansetron (ZOFRAN) IV, mouth rinse  REVIEW OF SYSTEMS:    10 Point review of Systems was done is negative except as noted above.   LABORATORY DATA:  I have reviewed the data as listed  .    Latest Ref Rng & Units 12/14/2022    5:48 AM  12/12/2022    5:50 AM 12/06/2022    5:06 AM  CBC  WBC 4.0 - 10.5 K/uL 15.9  17.8  9.3   Hemoglobin 13.0 - 17.0 g/dL 40.9  81.1  9.9   Hematocrit 39.0 - 52.0 % 36.8  34.0  31.2   Platelets 150 - 400 K/uL 286  276  246     .    Latest Ref Rng & Units 12/14/2022    5:48 AM 12/12/2022    5:50 AM 12/11/2022   11:45 AM  CMP  Glucose 70 - 99 mg/dL 914  782  956   BUN 8 - 23 mg/dL 56  56  45   Creatinine 0.61 - 1.24 mg/dL 2.13  0.86  5.78   Sodium 135 - 145 mmol/L 137  136  136   Potassium 3.5 - 5.1 mmol/L 4.7  4.2  4.8   Chloride 98 - 111 mmol/L 109  109  106   CO2 22 - 32 mmol/L 19  18  15    Calcium 8.9 - 10.3 mg/dL 9.1  9.1  9.7      RADIOGRAPHIC STUDIES: I have personally reviewed the radiological images as listed and agreed with the findings in the report. DG FL GUIDED LUMBAR PUNCTURE  Result Date: 12/12/2022 CLINICAL DATA:  CNS lymphoma.  New intracranial mass. EXAM: DIAGNOSTIC LUMBAR PUNCTURE UNDER FLUOROSCOPIC GUIDANCE FLUOROSCOPY: Radiation Exposure Index (as provided by the fluoroscopic device): 10.8 mGy PROCEDURE: Informed consent was obtained from the patient prior to the procedure, including potential complications of headache, allergy, brain herniation and pain. With the patient prone, the lower back was prepped with Betadine. 1% Lidocaine was used for local anesthesia. Lumbar puncture was performed at the L3-L4 level using a 20 gauge needle with return of clear CSF with an opening pressure of 15 cm water.  Ten ml of CSF were obtained for laboratory studies. The patient tolerated the procedure well and there were no apparent complications. IMPRESSION: Successful lumbar puncture. Electronically Signed   By: Genevive Bi M.D.   On: 12/12/2022 16:53   CT HEAD WO CONTRAST ( )  Result Date: 12/12/2022 CLINICAL DATA:  Brain/CNS neoplasm, assess treatment response fall and evaluation for increased ICP prior to LP per IR recommendations. EXAM: CT HEAD WITHOUT CONTRAST  TECHNIQUE: Contiguous axial images were obtained from the base of the skull through the vertex without intravenous contrast. RADIATION DOSE REDUCTION: This exam was performed according to the departmental dose-optimization program which includes automated exposure control, adjustment of the mA and/or kV according to patient size and/or use of iterative reconstruction technique. COMPARISON:  Head CT 12/07/2022.  MRI brain 12/08/2022. FINDINGS: Brain: Unchanged hyperdense masses in the anteromedial right frontal lobe and posterior aspect of the right superior frontal gyrus with surrounding vasogenic edema, again suspicious for CNS involvement of lymphoma. Mild mass effect on the right-greater-than-left frontal horns of the lateral ventricles. No hydrocephalus or herniation. No extra-axial collection. Vascular: No hyperdense vessel or unexpected calcification. Skull: No calvarial fracture or suspicious bone lesion. Skull base is unremarkable. Sinuses/Orbits: No acute findings. Other: None. IMPRESSION: 1. Unchanged hyperdense masses in the anteromedial right frontal lobe and posterior aspect of the right superior frontal gyrus with surrounding vasogenic edema, again suspicious for CNS involvement of lymphoma. 2. No hydrocephalus or herniation. Electronically Signed   By: Orvan Falconer M.D.   On: 12/12/2022 12:03   CT CHEST ABDOMEN PELVIS WO CONTRAST  Result Date: 12/10/2022 CLINICAL DATA:  Inpatient, presents with concern for recurrent large B-cell lymphoma. History of left orchiectomy for same in February 2022. Contrast withheld due to renal insufficiency. EXAM: CT CHEST, ABDOMEN AND PELVIS WITHOUT CONTRAST TECHNIQUE: Multidetector CT imaging of the chest, abdomen and pelvis was performed following the standard protocol without IV contrast. RADIATION DOSE REDUCTION: This exam was performed according to the departmental dose-optimization program which includes automated exposure control, adjustment of the mA  and/or kV according to patient size and/or use of iterative reconstruction technique. COMPARISON:  Portable chest 12/05/2022, PA Lat chest 09/02/2022, PET-CT studies 12/03/2020 and 05/11/2020, CTA chest 03/21/2020. FINDINGS: CT CHEST FINDINGS Cardiovascular: There is mild cardiomegaly again noted with a left chamber predominance. There are sternotomy sutures and CABG changes. Native coronary arteries are heavily calcified. The pulmonary trunk measures prominent at 3.2 cm indicating arterial hypertension, unchanged. There is aortic and great vessel atherosclerosis, mild amount without evidence of aortic aneurysm, with descending segment tortuosity. Pulmonary veins are nondistended. A left chest battery has been inserted since the previous PET-CT, with single lead right ventricular wire placement. Mediastinum/Nodes: Subcentimeter right paratracheal and subcarinal mediastinal lymph nodes are unchanged in the interval. No new or progressive adenopathy is seen without contrast. Thyroid is mostly obscured by metal artifact from the pacemaker/AID but normal where visible. Moderate-sized hiatal hernia is increased in size since 2022. The thoracic trachea, main bronchi, thoracic esophagus are unremarkable. Lungs/Pleura: There are new bilateral minimal layering pleural effusions, slightly greater fluid on the left. No pneumothorax. There is mild subpleural reticulation in the lung bases, but no honeycombing. There is mild posterior atelectasis in the lower lobes. No focal pneumonia is evident. No pulmonary nodules are seen. Musculoskeletal: Osteopenia and thoracic kyphosis and degenerative change. No acute or destructive osseous findings. There are subacute healing fractures of the left lateral fifth, sixth, and seventh ribs and posterolateral left eighth and ninth ribs. 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 CT ABDOMEN PELVIS FINDINGS Hepatobiliary: There are a few small layering stones in the  gallbladder but no wall thickening or biliary dilatation. The liver again demonstrating small scattered cysts, but without contrast no suspicious lesion is seen. Pancreas: There is mild pancreatic atrophy. No focal lesion is seen without contrast. Spleen: Unremarkable without contrast. Adrenals/Urinary Tract: There is no adrenal mass. There is no contour deforming mass of either unenhanced kidney. There is no urinary stone or obstruction. Bilateral perinephric fat stranding unchanged. The bladder is thickened but also not fully distended. Correlate clinically for cystitis or hypertrophy versus nondistention. Unchanged. Stomach/Bowel: Hiatal hernia. No dilatation or wall thickening including the appendix. Sigmoid diverticulosis without evidence of diverticulitis. Rectosigmoid patent surgical anastomosis is again shown. Vascular/Lymphatic: No abdominal, pelvic or inguinal adenopathy. Prior left orchiectomy. Moderate aortoiliac atherosclerosis. No AAA. Reproductive: Enlarged prostate, 5.2 cm transverse. Dystrophic calcifications left lobe chronically. Other: No abdominal wall hernia or abnormality. No abdominopelvic ascites. Musculoskeletal: Degenerative changes lumbar spine mild osteopenia. No acute or significant osseous findings. IMPRESSION: 1. No evidence of enlarged or progressive lymph nodes in the chest, abdomen or pelvis without contrast. 2. New minimal bilateral pleural effusions with mild posterior atelectasis. 3. Cardiomegaly with CABG changes. 4. Prominent pulmonary trunk indicating arterial hypertension, unchanged. 5. Moderate-sized hiatal hernia increased in size since 2022. 6. Cholelithiasis. 7. Diverticulosis without evidence of diverticulitis. 8. Prostatomegaly. 9. Subacute healing fractures of the left lateral fifth, sixth, and seventh ribs and posterolateral left eighth and ninth ribs. 10. Osteopenia and degenerative change. 11. Aortic atherosclerosis. Aortic Atherosclerosis (ICD10-I70.0).  Electronically Signed   By: Almira Bar M.D.   On: 12/10/2022 02:53   MR BRAIN W WO CONTRAST  Result Date: 12/08/2022 CLINICAL DATA:  Brain/CNS neoplasm, monitor follow up right frontal mass EXAM: MRI HEAD WITHOUT AND WITH CONTRAST TECHNIQUE: Multiplanar, multiecho pulse sequences of the brain and surrounding structures were obtained without and with intravenous contrast. CONTRAST:  9mL GADAVIST GADOBUTROL 1 MMOL/ML IV SOLN COMPARISON:  Same day CT head FINDINGS: Brain: Negative for an acute infarct. No hemorrhage. No hydrocephalus. No extra-axial fluid collection. No hydrocephalus. There is a background of mild chronic microvascular ischemic change. There is a dominant contrast-enhancing mass right frontal lobe measuring 3.9 x 2.1 x 4.7 Cm (series 7, image 35). There is an additional smaller contrast-enhancing mass near the posterior limb of the internal capsule on the right measuring 9 x 8 mm (series 11, image 31). There has an additional separate contrast-enhancing lesion in the high right frontal lobe measuring 1.4 x 0.8 cm (series 11, image 48). There is T2/FLAIR hyperintense signal surrounding this lesion. There is evidence of mild diffusion restriction associated with lesions. There is mass effect on the right lateral ventricular system Vascular: Normal flow voids. Skull and upper cervical spine: Normal marrow signal. Sinuses/Orbits: Small right mastoid effusion. No middle ear effusion. Paranasal sinuses are notable for mucosal thickening in the bilateral ethmoid sinuses. Orbits are unremarkable. Other: None. IMPRESSION: Dominant contrast-enhancing mass in the right frontal lobe measuring 3.9 x 2.1 x 4.7 cm with additional smaller contrast-enhancing lesions in the high right frontal lobe and near the posterior limb of the internal capsule on the right. 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 Electronically Signed   By: Lorenza Cambridge M.D.   On: 12/08/2022 14:30   CT HEAD WO CONTRAST ( )  Result  Date: 12/08/2022 CLINICAL DATA:  Neuro deficit with acute stroke suspected. Weakness. EXAM: CT HEAD WITHOUT CONTRAST  TECHNIQUE: Contiguous axial images were obtained from the base of the skull through the vertex without intravenous contrast. RADIATION DOSE REDUCTION: This exam was performed according to the departmental dose-optimization program which includes automated exposure control, adjustment of the mA and/or kV according to patient size and/or use of iterative reconstruction technique. COMPARISON:  01/14/2021 brain MRI FINDINGS: Brain: Indistinct masslike high-density in the anterior right frontal region with adjacent vasogenic type pattern of white matter low-density and expansion. There is local mass effect. No meningioma or other mass seen in this region on prior, concerning for aggressive an intra-axial mass, possibly related to the patient's history of lymphoma. No indication of acute infarct, hemorrhage, hydrocephalus, or shift. Vascular: No hyperdense vessel or unexpected calcification. Skull: Normal. Negative for fracture or focal lesion. Sinuses/Orbits: No acute finding. IMPRESSION: Indistinct masslike area in the right frontal lobe with regional swelling and mass effect. Recommend brain MRI with contrast. Electronically Signed   By: Tiburcio Pea M.D.   On: 12/08/2022 06:27   DG Chest Port 1 View  Result Date: 12/05/2022 CLINICAL DATA:  Shortness of breath EXAM: PORTABLE CHEST 1 VIEW COMPARISON:  Chest x-ray 09/02/2022 FINDINGS: Sternotomy wires, mediastinal clips and left-sided pacemaker are again seen. The heart size and mediastinal contours are within normal limits. Both lungs are clear. The visualized skeletal structures are unremarkable. IMPRESSION: No active disease. Electronically Signed   By: Darliss Cheney M.D.   On: 12/05/2022 23:55   CUP PACEART REMOTE DEVICE CHECK  Result Date: 12/01/2022 Scheduled remote reviewed. Normal device function.  Recent increase in PVC's per trends,  918/hr over last 24hrs Thoracic impedance trending down Next remote 91 days. LA, CVRS   ASSESSMENT & PLAN:   77 year old wonderful gentleman with history of hypertension, borderline diabetes, dyslipidemia, GERD with recent STEMI on 03/21/2020 and status post four-vessel CABG on 03/25/2020.   1) h/o Left-sided primary testicular large B-cell lymphoma diagnosed in 02/2020 S/p ipsilateral orchiectomy S/p 6 cycles of R-CEOP and 3 dose of IT MTX (for CNS prophylaxis) and R Tto contralateral testis. BCL-2, BCL 6, c-Myc negative. 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    PLAN:  -Discussed lab results on 12/14/22 in detail with patient. CBC showed WBC of 15.9K, hemoglobin of 11.5, and platelets of 286K. -will plan for MRI of spine to evaluate for any tumors  -patient has starting receiving CNS radiation -Steroids have been helping patient eat and has been helping him regain strength in his upper and lower extremities.  - can reduce the dexamethasone to 4 mg every 8 hours for 3 to 4 days and then 4 mg every 12 hours till the completion of radiation and then tapering off as per radiation oncology input. -PT evaluation noted. -Oncology will continue to follow  The total time spent in the appointment was 35 minutes* .  All of the patient's questions were answered with apparent satisfaction. The patient knows to call the clinic with any problems, questions or concerns.   Wyvonnia Lora MD MS AAHIVMS [facility] Hematology/Oncology Physician [facility]  .*Total Encounter Time as defined by the Centers for  Medicare and Medicaid Services includes,  in addition to the face-to-face time of a patient visit (documented in the note above) non-face-to-face time: obtaining and reviewing outside history, ordering and reviewing medications, tests or procedures, care coordination (communications with other health care professionals or caregivers) and documentation in the medical record.    I,Mitra Faeizi,acting as a Neurosurgeon for No name on file.,have documented all relevant documentation on the behalf of No name on file,as directed by  No name on file while in the presence of No name on file. [name]  .I have reviewed the above documentation for accuracy and completeness, and I agree with the above. Wyvonnia Lora MD MS

## 2022-12-14 NOTE — Plan of Care (Signed)
  Problem: Education: Goal: Ability to describe self-care measures that may prevent or decrease complications (Diabetes Survival Skills Education) will improve Outcome: Progressing   Problem: Coping: Goal: Ability to adjust to condition or change in health will improve Outcome: Progressing   Problem: Pain Management: Goal: General experience of comfort will improve Outcome: Progressing   Problem: Skin Integrity: Goal: Risk for impaired skin integrity will decrease Outcome: Progressing

## 2022-12-14 NOTE — Progress Notes (Signed)
Occupational Therapy Treatment Patient Details Name: Roy [name] MRN: [id number] DOB: 1945/11/07 Today's Date: 12/14/2022   History of present illness Pt is a 77 y/o admitted 12/05/22 for weakness and slipping out of chair at home unable to stand up.  Noted on AICD severe spike in PVC and pt with hypomagnesemia. 11/13 head CT with Rt frontal lobe mass. PMH - testicular CA s/p orchiecetomy, large B cell lymphoma, HTN, HLD, DM, CAD s/p CABG x 4 2022, CHF, cardiomyopathy s/p ICD, recurrent syncope.   OT comments  Patient was able to make progress in sit to stands to reduce caregiver a with transfers. Patient reported having radiation session this afternoon. Patient will benefit from continued inpatient follow up therapy, <3 hours/day. Patient's discharge plan remains appropriate at this time. OT will continue to follow acutely.        If plan is discharge home, recommend the following:  A lot of help with walking and/or transfers;A lot of help with bathing/dressing/bathroom;Assist for transportation;Assistance with cooking/housework   Equipment Recommendations  None recommended by OT       Precautions / Restrictions Precautions Precautions: Fall Restrictions Weight Bearing Restrictions: No       Mobility Bed Mobility Overal bed mobility: Needs Assistance Bed Mobility: Sit to Supine       Sit to supine: Min assist, HOB elevated, Used rails   General bed mobility comments: min A to get BLE back into bed.           Balance   Sitting-balance support: Feet supported Sitting balance-Leahy Scale: Good     Standing balance support: Reliant on assistive device for balance, Bilateral upper extremity supported Standing balance-Leahy Scale: Poor         ADL either performed or assessed with clinical judgement      Cognition Arousal: Alert Behavior During Therapy: WFL for tasks assessed/performed Overall Cognitive Status: Within Functional Limits for tasks assessed                                  General Comments: patient HOH but very cooperative. patients family present in room as well.        Exercises Other Exercises Other Exercises: patient participated in 6 sit to stands with CGA and increased time. patient was noted to have SOB during session but O2 was 99% on RA.            Pertinent Vitals/ Pain       Pain Assessment Pain Assessment: No/denies pain         Frequency  Min 1X/week        Progress Toward Goals  OT Goals(current goals can now be found in the care plan section)  Progress towards OT goals: Progressing toward goals     Plan         AM-PAC OT "6 Clicks" Daily Activity     Outcome Measure   Help from another person eating meals?: A Little Help from another person taking care of personal grooming?: A Little Help from another person toileting, which includes using toliet, bedpan, or urinal?: A Lot Help from another person bathing (including washing, rinsing, drying)?: A Lot Help from another person to put on and taking off regular upper body clothing?: A Little Help from another person to put on and taking off regular lower body clothing?: A Lot 6 Click Score: 15    End of Session Equipment Utilized During Treatment:  Rolling walker (2 wheels);Gait belt  OT Visit Diagnosis: Unsteadiness on feet (R26.81);Muscle weakness (generalized) (M62.81);History of falling (Z91.81)   Activity Tolerance Patient tolerated treatment well   Patient Left with call bell/phone within reach;in bed;with bed alarm set   Nurse Communication Mobility status        Time: 1610-9604 OT Time Calculation (min): 14 min  Charges: OT General Charges $OT Visit: 1 Visit OT Treatments $Therapeutic Activity: 8-22 mins  Rosalio Loud, MS Acute Rehabilitation Department Office# [phone]   Selinda Flavin 12/14/2022, 3:26 PM

## 2022-12-15 ENCOUNTER — Other Ambulatory Visit: Payer: Self-pay

## 2022-12-15 ENCOUNTER — Ambulatory Visit (HOSPITAL_COMMUNITY)
Admit: 2022-12-15 | Discharge: 2022-12-15 | Disposition: A | Discharge status: Home / Self Care | Payer: Medicare HMO | Attending: Hematology | Admitting: Hematology

## 2022-12-15 ENCOUNTER — Ambulatory Visit
Admit: 2022-12-15 | Discharge: 2022-12-15 | Disposition: A | Discharge status: Home / Self Care | Payer: Medicare HMO | Attending: Radiation Oncology | Admitting: Radiation Oncology

## 2022-12-15 DIAGNOSIS — M48061 Spinal stenosis, lumbar region without neurogenic claudication: Secondary | ICD-10-CM | POA: Diagnosis not present

## 2022-12-15 DIAGNOSIS — M4804 Spinal stenosis, thoracic region: Secondary | ICD-10-CM | POA: Diagnosis not present

## 2022-12-15 DIAGNOSIS — M4802 Spinal stenosis, cervical region: Secondary | ICD-10-CM | POA: Diagnosis not present

## 2022-12-15 DIAGNOSIS — G9389 Other specified disorders of brain: Secondary | ICD-10-CM | POA: Diagnosis not present

## 2022-12-15 DIAGNOSIS — M50321 Other cervical disc degeneration at C4-C5 level: Secondary | ICD-10-CM | POA: Diagnosis not present

## 2022-12-15 DIAGNOSIS — N1832 Chronic kidney disease, stage 3b: Secondary | ICD-10-CM | POA: Diagnosis not present

## 2022-12-15 DIAGNOSIS — C8330 Diffuse large B-cell lymphoma, unspecified site: Secondary | ICD-10-CM | POA: Diagnosis not present

## 2022-12-15 DIAGNOSIS — C8339 Primary central nervous system lymphoma: Secondary | ICD-10-CM | POA: Diagnosis not present

## 2022-12-15 LAB — RAD ONC ARIA SESSION SUMMARY
Course Elapsed Days: 1
Plan Fractions Treated to Date: 2
Plan Prescribed Dose Per Fraction: 2.3 Gy
Plan Total Fractions Prescribed: 17
Plan Total Prescribed Dose: 39.1 Gy
Reference Point Dosage Given to Date: 4.6 Gy
Reference Point Session Dosage Given: 2.3 Gy
Session Number: 2

## 2022-12-15 LAB — GLUCOSE, CAPILLARY
Glucose-Capillary: 176 mg/dL — ABNORMAL HIGH (ref 70–99)
Glucose-Capillary: 181 mg/dL — ABNORMAL HIGH (ref 70–99)
Glucose-Capillary: 192 mg/dL — ABNORMAL HIGH (ref 70–99)
Glucose-Capillary: 128 mg/dL — ABNORMAL HIGH (ref 70–99)

## 2022-12-15 MED ORDER — GADOBUTROL 1 MMOL/ML IV SOLN
7.0000 mL | Freq: Once | INTRAVENOUS | Status: AC | PRN
  Administered 2022-12-15: 7 mL via INTRAVENOUS

## 2022-12-15 NOTE — Progress Notes (Signed)
Patient left facility at this time to go to [facility] for MRI. Left floor per stretcher via carelink.

## 2022-12-15 NOTE — Progress Notes (Signed)
Patient returned to facility per carelink via stretcher, wife at bedside. No distress or complaints voiced.

## 2022-12-15 NOTE — Progress Notes (Signed)
  Radiation Oncology         [phone] ________________________________  Name: Roy [name] MRN: [id number]  Date: 12/12/2022  DOB: 1945/03/17  SIMULATION AND TREATMENT PLANNING NOTE    ICD-10-CM   1. 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   NARRATIVE:  The patient was brought to the CT Simulation planning suite.  Identity was confirmed.  All relevant records and images related to the planned course of therapy were reviewed.  The patient freely provided informed written consent to proceed with treatment after reviewing the details related to the planned course of therapy. The consent form was witnessed and verified by the simulation staff.  Then, the patient was set-up in a stable reproducible  supine position for radiation therapy.  CT images were obtained.  Surface markings were placed.  The CT images were loaded into the planning software.  Then the planning CT was fused with the MRI by our physics staff.  The target and avoidance structures were contoured.  Treatment planning then occurred.  The radiation prescription was entered and confirmed.  I designed and supervised the construction of a total of one medically necessary complex treatment device consisting of thermoplastic mask used for immobilization.  IMAGE GUIDANCE:  The patient will undergo kV-kV orthogonal imaging prior to each fraction with precision couch adjustments for image guided radiotherapy.   PLAN TYPE:  I have requested : 3D plan is medically necessary for this case for the following reason:  Critical CNS structure avoidance - brainstem, optic chiasm, optic nerve.[name]  PLAN:  The grossly involved CNS lesions will be treated to a total dose of 39.1 Gy in 17 fractions while the whole brain is treated to 30.6 Gy using dose  painting simultaneous integrated boost.  ________________________________  Artist Pais. Kathrynn Running, M.D.

## 2022-12-15 NOTE — Plan of Care (Signed)
  Problem: Fluid Volume: Goal: Ability to maintain a balanced intake and output will improve Outcome: Progressing   Problem: Metabolic: Goal: Ability to maintain appropriate glucose levels will improve Outcome: Progressing   Problem: Nutritional: Goal: Maintenance of adequate nutrition will improve Outcome: Progressing Goal: Progress toward achieving an optimal weight will improve Outcome: Progressing   Problem: Skin Integrity: Goal: Risk for impaired skin integrity will decrease Outcome: Progressing   Problem: Clinical Measurements: Goal: Will remain free from infection Outcome: Progressing Goal: Respiratory complications will improve Outcome: Progressing

## 2022-12-15 NOTE — Plan of Care (Signed)
  Problem: Skin Integrity: Goal: Risk for impaired skin integrity will decrease Outcome: Progressing   Problem: Tissue Perfusion: Goal: Adequacy of tissue perfusion will improve Outcome: Progressing   Problem: Nutrition: Goal: Adequate nutrition will be maintained Outcome: Progressing   Problem: Elimination: Goal: Will not experience complications related to bowel motility Outcome: Progressing   Problem: Safety: Goal: Ability to remain free from injury will improve Outcome: Progressing

## 2022-12-15 NOTE — Progress Notes (Signed)
PROGRESS NOTE  Roy [name] MVH:[id number] DOB: [birth year]   PCP: Irena Reichmann, DO  Patient is from: Home.  Lives with his wife.  DOA: 12/05/2022 LOS: 8  Chief complaints Chief Complaint  Patient presents with   Weakness     Brief Narrative / Interim history: 77 year old M with PMH of systolic CHF with ICD, CAD/CABG, remote testicular lymphoma s/p orchiectomy, CKD, DM-2, HTN and diminished hearing presented to ED with generalized weakness, falls, confusion and poor p.o. intake, and admitted for frequent PVCs, generalized weakness, falls and hypomagnesemia.  CT head showed right frontal mass.  MRI brain confirmed right frontal lobe mass suspicious for CNS lymphoma.  CT chest, abdomen and pelvis with no enlarged or progressive lymph nodes.  Oncology and radiation oncology consulted.  Patient was transferred to [facility] for further evaluation and care.   Underwent CT simulation and fluoroscopy guided lumbar puncture on 11/18.  CSF fluid with elevated glucose and protein.  CSF culture negative.  Cytology negative for malignant cells but cytometry with elevated CD19 and CD20.  Started radiation treatment.  MRI spine pending.    Subjective: Seen and examined earlier this morning.  No major events overnight of this morning.  No complaints.  Objective: Vitals:   12/14/22 0540 12/14/22 1418 12/14/22 2050 12/15/22 0545  BP: (!) 142/83 135/78 123/78 129/81  Pulse: 65 65 72 63  Resp: 16 16 15 16   Temp: 97.7 F (36.5 C) (!) 97.3 F (36.3 C) 97.7 F (36.5 C) (!) 97.5 F (36.4 C)  TempSrc: Oral Oral Oral Oral  SpO2: 98% 100% 97% 97%  Weight:      Height:        Examination:  GENERAL: No apparent distress.  Nontoxic. HEENT: MMM.  Vision grossly intact.  Diminished hearing. NECK: Supple.  No apparent JVD.  RESP:  No IWOB.  Fair aeration bilaterally. CVS:  RRR. Heart sounds normal.  ABD/GI/GU: BS+. Abd soft, NTND.  MSK/EXT:  Moves extremities. No apparent deformity. No edema.   SKIN: no apparent skin lesion or wound NEURO: Awake, alert and oriented appropriately.  No apparent focal neuro deficit. PSYCH: Calm. Normal affect.  Not engaging.  Focused on breakfast.  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  MRI brain showed 3.9 x 2.1 x 4.7 cm right frontal mass with additional smaller contrast-enhancing lesions in high right frontal lobe and near the posterior limb of internal capsule on the right.  CT chest, abdomen and pelvis with contrast without evidence of metastasis -Oncology and radiation oncology on board -S/p LP on 11/18.  CSF with elevated glucose and protein.  Culture negative. Cytology negative for malignant cells.  Cytometry with elevated CD19 and CD20. -Started radiation treatment on 11/20. -Oncology recs: Decadron 4 mg Q8h for 3 days>>Q12h until he completes radiation treatment and a slow taper after that -Follow-up MRI spine   Chronic systolic CHF/cardiomyopathy with ICD: TTE 09/2022 with LVEF of 25 to 30%, GH, G1DD.  On Jardiance, Aldactone, Toprol-XL and losartan at home.  Appears euvolemic on exam.  Denies respiratory symptoms. -Continue home medications -Strict intake and output, daily weight, renal functions and electrolytes  CKD-3B: Stable Recent Labs    09/02/22 1000 11/07/22 1238 12/05/22 2134 12/06/22 0506 12/07/22 0213 12/08/22 0227 12/09/22 0811 12/11/22 1145 12/12/22 0550 12/14/22 0548  BUN 29* 23 13 12 17 14 17  45* 56* 56*  CREATININE 2.13* 1.44* 1.31* 1.41* 1.75* 1.49* 1.32* 1.69* 1.68* 1.46*  -Continue monitoring  NIDDM-2 with hyperglycemia: A1c 6.2%.  Hyperglycemia partly due to steroid Recent Labs  Lab 12/14/22 1107 12/14/22 1627 12/14/22 2051 12/15/22 0715 12/15/22 1131  GLUCAP 274* 141* 196* 176* 181*  -Continue SSI moderate -Increase  Semglee from 10 to 15 units daily. -Increase NovoLog from 3 to 5 units 3 times daily with meals -Further adjustment as appropriate  Acute metabolic encephalopathy, multifactorial.  Seems to have resolved. 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 -Per oncology  CAD/CABG: Stable -Continue home meds   Hypokalemia/hypomagnesemia -Monitor replenish as appropriate  Generalized weakness/frequent falls/fall in the hospital: Patient "slid down to the floor into sitting position" when he got up to go to bathroom last night.  No fall with impact.  No injury.  CT head after fall without acute finding -Fall precaution in place -PT/OT   Essential hypertension: Normotensive -Continue Toprol-XL, losartan, Aldactone  PVCs: No ventricular tachycardia. -Continue Toprol-XL -Cardiology recommended outpatient follow-up and signed off. -Optimize electrolytes        Body mass index is 25.47 kg/m.         DVT prophylaxis:  enoxaparin (LOVENOX) injection 40 mg Start: 12/06/22 1600  Code Status: Full code Family Communication: None at bedside today. Level of care: Med-Surg Status is: Inpatient Remains inpatient appropriate because: Brain mass concerning for recurrence of lymphoma   Final disposition: SNF Consultants:  Oncology Cardiology  35 minutes with more than 50% spent in reviewing records, counseling patient/family and coordinating care.   Sch Meds:  Scheduled Meds:  aspirin EC  81 mg Oral Daily   atorvastatin  40 mg Oral Daily   dexamethasone  4 mg Oral Q8H   Followed by   Melene Muller ON 12/16/2022] dexamethasone  4 mg Oral Q12H   enoxaparin (LOVENOX) injection  40 mg Subcutaneous Q24H   famotidine  40 mg Oral Daily   feeding supplement  237 mL Oral BID BM   insulin aspart  0-15 Units  Subcutaneous TID WC   insulin aspart  0-5 Units Subcutaneous QHS   insulin aspart  5 Units Subcutaneous TID WC   insulin glargine-yfgn  15 Units Subcutaneous Daily   losartan  50 mg Oral Daily   metoprolol  200 mg Oral Daily   spironolactone  12.5 mg Oral Daily   Continuous Infusions: PRN Meds:.acetaminophen **OR** acetaminophen, ondansetron **OR** ondansetron (ZOFRAN) IV, mouth rinse  Antimicrobials: Anti-infectives (From admission, onward)    None        I have personally reviewed the following labs and images: CBC: Recent Labs  Lab 12/12/22 0550 12/14/22 0548  WBC 17.8* 15.9*  HGB 10.9* 11.5*  HCT 34.0* 36.8*  MCV 93.7 94.8  PLT 276 286   BMP &GFR Recent Labs  Lab 12/09/22 0811 12/11/22 1145 12/12/22 0550 12/14/22 0548  NA 138 136 136 137  K 4.1 4.8 4.2 4.7  CL 110 106 109 109  CO2 18* 15* 18* 19*  GLUCOSE 134* 258* 208* 211*  BUN 17 45* 56* 56*  CREATININE 1.32* 1.69* 1.68* 1.46*  CALCIUM 9.1 9.7 9.1 9.1  MG  --   --  1.9 2.0  PHOS  --   --  3.5 3.0   Estimated Creatinine Clearance: 45 mL/min (A) (by C-G formula based on SCr of 1.46 mg/dL (H)).  Liver & Pancreas: Recent Labs  Lab 12/12/22 0550 12/14/22 0548  ALBUMIN 3.3* 3.4*   No results for input(s): "LIPASE", "AMYLASE" in the last 168 hours. No results for input(s): "AMMONIA" in the last 168 hours. Diabetic: No results for input(s): "HGBA1C" in the last 72 hours. Recent Labs  Lab 12/14/22 1107 12/14/22 1627 12/14/22 2051 12/15/22 0715 12/15/22 1131  GLUCAP 274* 141* 196* 176* 181*   Cardiac Enzymes: No results for input(s): "CKTOTAL", "CKMB", "CKMBINDEX", "TROPONINI" in the last 168 hours. Recent Labs    09/02/22 1000  PROBNP 609*   Coagulation Profile: No results for input(s): "INR", "PROTIME" in the last 168 hours. Thyroid Function Tests: No results for input(s): "TSH", "T4TOTAL", "FREET4", "T3FREE", "THYROIDAB" in the last 72 hours. Lipid Profile: No results for input(s):  "CHOL", "HDL", "LDLCALC", "TRIG", "CHOLHDL", "LDLDIRECT" in the last 72 hours. Anemia Panel: No results for input(s): "VITAMINB12", "FOLATE", "FERRITIN", "TIBC", "IRON", "RETICCTPCT" in the last 72 hours. Urine analysis:    Component Value Date/Time   COLORURINE AMBER (A) 12/06/2022 0125   APPEARANCEUR HAZY (A) 12/06/2022 0125   LABSPEC 1.028 12/06/2022 0125   PHURINE 5.0 12/06/2022 0125   GLUCOSEU >=500 (A) 12/06/2022 0125   HGBUR NEGATIVE 12/06/2022 0125   BILIRUBINUR NEGATIVE 12/06/2022 0125   KETONESUR 5 (A) 12/06/2022 0125   PROTEINUR NEGATIVE 12/06/2022 0125   UROBILINOGEN 1.0 03/12/2013 1936   NITRITE NEGATIVE 12/06/2022 0125   LEUKOCYTESUR NEGATIVE 12/06/2022 0125   Sepsis Labs: Invalid input(s): "PROCALCITONIN", "LACTICIDVEN"  Microbiology: Recent Results (from the past 240 hour(s))  MRSA Next Gen by PCR, Nasal     Status: None   Collection Time: 12/06/22  2:49 PM   Specimen: Nasal Mucosa; Nasal Swab  Result Value Ref Range Status   MRSA by PCR Next Gen NOT DETECTED NOT DETECTED Final    Comment: (NOTE) The GeneXpert MRSA Assay (FDA approved for NASAL specimens only), is one component of a comprehensive MRSA colonization surveillance program. It is not intended to diagnose MRSA infection nor to guide or monitor treatment for MRSA infections. Test performance is not FDA approved in patients less than 17 years old. Performed at [facility], [address] [place], [place] [address]   CSF culture w Gram Stain     Status: None (Preliminary result)   Collection Time: 12/12/22  3:44 PM   Specimen: PATH Cytology CSF; Cerebrospinal Fluid  Result Value Ref Range Status   Specimen Description   Final    CSF Performed at [facility], [address], [place], [place] [address]    Special Requests   Final    NONE Performed at [facility], [address] [place], [place] [address]    Gram Stain   Final    NO WBC SEEN NO  ORGANISMS SEEN CYTOSPIN SMEAR Performed at [facility], [address], [place], [place] [address]    Culture   Final    NO GROWTH 3 DAYS Performed at [facility], [address] [place], [place] [address]    Report Status PENDING  Incomplete    Radiology Studies: No results found.    Jakeisha Stricker T. Pepper Wyndham [facility]  If 7PM-7AM, please contact night-coverage [email or web address] 12/15/2022, 2:34 PM

## 2022-12-16 ENCOUNTER — Ambulatory Visit
Admit: 2022-12-16 | Discharge: 2022-12-16 | Disposition: A | Discharge status: Home / Self Care | Payer: Medicare HMO | Attending: Radiation Oncology

## 2022-12-16 ENCOUNTER — Ambulatory Visit
Admit: 2022-12-16 | Discharge: 2022-12-16 | Disposition: A | Discharge status: Home / Self Care | Payer: Medicare HMO | Attending: Radiation Oncology | Admitting: Radiation Oncology

## 2022-12-16 ENCOUNTER — Other Ambulatory Visit: Payer: Self-pay

## 2022-12-16 DIAGNOSIS — C83398 Diffuse large b-cell lymphoma of other extranodal and solid organ sites: Secondary | ICD-10-CM | POA: Diagnosis not present

## 2022-12-16 DIAGNOSIS — G9389 Other specified disorders of brain: Secondary | ICD-10-CM | POA: Diagnosis not present

## 2022-12-16 DIAGNOSIS — C8339 Primary central nervous system lymphoma: Secondary | ICD-10-CM | POA: Diagnosis not present

## 2022-12-16 DIAGNOSIS — N1832 Chronic kidney disease, stage 3b: Secondary | ICD-10-CM | POA: Diagnosis not present

## 2022-12-16 DIAGNOSIS — C8330 Diffuse large B-cell lymphoma, unspecified site: Secondary | ICD-10-CM | POA: Diagnosis not present

## 2022-12-16 LAB — RENAL FUNCTION PANEL
Albumin: 3.1 g/dL — ABNORMAL LOW (ref 3.5–5.0)
Anion gap: 10 (ref 5–15)
BUN: 41 mg/dL — ABNORMAL HIGH (ref 8–23)
CO2: 16 mmol/L — ABNORMAL LOW (ref 22–32)
Calcium: 9 mg/dL (ref 8.9–10.3)
Chloride: 111 mmol/L (ref 98–111)
Creatinine, Ser: 1.14 mg/dL (ref 0.61–1.24)
GFR, Estimated: >60 mL/min (ref >60)
Glucose, Bld: 164 mg/dL — ABNORMAL HIGH (ref 70–99)
Phosphorus: 3.6 mg/dL (ref 2.5–4.6)
Potassium: 4.4 mmol/L (ref 3.5–5.1)
Sodium: 137 mmol/L (ref 135–145)

## 2022-12-16 LAB — RAD ONC ARIA SESSION SUMMARY
Course Elapsed Days: 2
Plan Fractions Treated to Date: 3
Plan Prescribed Dose Per Fraction: 2.3 Gy
Plan Total Fractions Prescribed: 17
Plan Total Prescribed Dose: 39.1 Gy
Reference Point Dosage Given to Date: 6.9 Gy
Reference Point Session Dosage Given: 2.3 Gy
Session Number: 3

## 2022-12-16 LAB — GLUCOSE, CAPILLARY
Glucose-Capillary: 160 mg/dL — ABNORMAL HIGH (ref 70–99)
Glucose-Capillary: 242 mg/dL — ABNORMAL HIGH (ref 70–99)
Glucose-Capillary: 266 mg/dL — ABNORMAL HIGH (ref 70–99)
Glucose-Capillary: 310 mg/dL — ABNORMAL HIGH (ref 70–99)

## 2022-12-16 LAB — CSF CULTURE W GRAM STAIN
Culture: NO GROWTH
Gram Stain: NONE SEEN

## 2022-12-16 LAB — CBC
HCT: 36.3 % — ABNORMAL LOW (ref 39.0–52.0)
Hemoglobin: 11.9 g/dL — ABNORMAL LOW (ref 13.0–17.0)
MCH: 30.4 pg (ref 26.0–34.0)
MCHC: 32.8 g/dL (ref 30.0–36.0)
MCV: 92.6 fL (ref 80.0–100.0)
Platelets: 277 10*3/uL (ref 150–400)
RBC: 3.92 MIL/uL — ABNORMAL LOW (ref 4.22–5.81)
RDW: 12.8 % (ref 11.5–15.5)
WBC: 15.7 10*3/uL — ABNORMAL HIGH (ref 4.0–10.5)
nRBC: 0 % (ref 0.0–0.2)

## 2022-12-16 LAB — MAGNESIUM: Magnesium: 1.9 mg/dL (ref 1.7–2.4)

## 2022-12-16 NOTE — Progress Notes (Signed)
Chaplain assisted Roy [name] and his wife, Roy [name], with notarizing their advance care planning documents.  Papers were witnessed and notarized.  A hard  copy was placed in Roy [name]'s chart and both were scanned into ACP documents.  The originals along with a copy for their HCPOA were returned to them.  [address] Bcc Pager, [phone]

## 2022-12-16 NOTE — Progress Notes (Signed)
PT Cancellation Note  Patient Details Name: Roy [name] MRN: [id number] DOB: 11/02/1945   Cancelled Treatment:    Reason Eval/Treat Not Completed: Fatigue/lethargy limiting ability to participate. PT arrived 1555 and pt reported he was tired and wanted to take a nap. PT to continue to follow acutely.   [name], PT Acute Rehab   [name] 12/16/2022, 6:14 PM

## 2022-12-16 NOTE — Progress Notes (Signed)
PT Note  Patient Details Name: Roy [name] MRN: [id number] DOB: 1945-03-15   Cancelled Treatment:    Reason Eval/Treat Not Completed: Pt in bed and eating lunch and when PT returned wife and pt communicating with the chaplin. PT to return later in the day as schedule allows. PT to continue to follow acutely.   [name], PT Acute Rehab   [name] 12/16/2022, 1:02 PM

## 2022-12-16 NOTE — TOC Progression Note (Signed)
Transition of Care [facility]) - Progression Note    Patient Details  Name: Roy [name] MRN: [id number] Date of Birth: [birth year]  Transition of Care [facility]) CM/SW Contact  Beckie Busing, RN Phone Number:[phone]  12/16/2022, 12:09 PM  Clinical Narrative:    TOC at bedside to give patient and wife list of bed offers. Per patient he was told by MD that he would not  be ready for discharge until the week after Thanksgiving. Wife is unable to confirm. CM has presented bed offers. Wife and patient would like time to review bed offers. CM will follow up for decision. Message has been sent to [facility] per wife's request.    Expected Discharge Plan: Skilled Nursing Facility Barriers to Discharge: Continued Medical Work up  Expected Discharge Plan and Services   Discharge Planning Services: CM Consult Post Acute Care Choice: Home Health Living arrangements for the past 2 months: Single Family Home                           HH Arranged: OT, PT HH Agency: [facility] Date [facility] Agency Contacted: 12/07/22 Time HH Agency Contacted: 1203 Representative spoke with at [facility] Agency: Kandee Keen   Social Determinants of Health (SDOH) Interventions SDOH Screenings   Food Insecurity: No Food Insecurity (12/06/2022)  Housing: Low Risk  (12/06/2022)  Transportation Needs: No Transportation Needs (12/06/2022)  Utilities: Not At Risk (12/06/2022)  Depression (PHQ2-9): Low Risk  (09/09/2020)  Tobacco Use: Medium Risk (12/05/2022)    Readmission Risk Interventions    04/01/2020   10:16 AM 03/30/2020   11:34 AM  Readmission Risk Prevention Plan  Transportation Screening  Complete  PCP or Specialist Appt within 3-5 Days Complete   HRI or Home Care Consult  Complete  Social Work Consult for Recovery Care Planning/Counseling  Complete  Palliative Care Screening  Not Applicable  Medication Review Oceanographer)  Complete

## 2022-12-16 NOTE — Progress Notes (Signed)
HEMATOLOGY/ONCOLOGY INPATIENT PROGRESS NOTE  Date of Service: 12/16/2022  Inpatient Attending: .Almon Hercules, MD   SUBJECTIVE  Patient with h/o  Left-sided primary testicular large B-cell lymphoma diagnosed 02/2020. S/p ipsilateral orchiectomy S/p 6 cycles of R-CEOP and 3 dose of IT MTX (for CNS prophylaxis) and R Tto contralateral testis. BCL-2, BCL 6, c-Myc negative. Patient was in remission until recently.   He was seen by me on 12/14/2022 as an inpatient and demonstrated some functional improvement.   Mr Sampley notes no acute new symptoms. He is continuing his WBRT. He is being evaluated for acute rehab. No headaches or other new FND.  OBJECTIVE:  NAD  PHYSICAL EXAMINATION: . Vitals:   12/16/22 0514 12/16/22 1115 12/16/22 1115 12/16/22 1317  BP: 132/81 (!) 148/82 (!) 148/82 108/72  Pulse: (!) 57 77 77 75  Resp: 14 18 18 18   Temp: (!) 97.4 F (36.3 C) (!) 97.5 F (36.4 C) (!) 97.5 F (36.4 C) (!) 97.5 F (36.4 C)  TempSrc: Oral Oral Oral Oral  SpO2: 95% 98% 98% 98%  Weight:      Height:       Filed Weights   12/07/22 0417 12/09/22 0506 12/13/22 1100  Weight: 90.7 kg 89.6 kg 85.2 kg   .Body mass index is 25.47 kg/m.  GENERAL:alert, in no acute distress and comfortable   NECK: supple, no JVD, thyroid normal size, non-tender, without nodularity LYMPH:  no palpable lymphadenopathy in the cervical, axillary or inguinal LUNGS: clear to auscultation with normal respiratory effort HEART: regular rate & rhythm,  no murmurs and no lower extremity edema ABDOMEN: abdomen soft, non-tender, normoactive bowel sounds  Musculoskeletal: no cyanosis of digits and no clubbing  PSYCH: alert & oriented x 3   MEDICAL HISTORY:  Past Medical History:  Diagnosis Date   Allergic rhinitis    Arthritis    wrists   Cardiomyopathy, nonischemic (HCC)    followed by cardiology--- dr Delton See---  2016 ef 45-50% ,  2016 nuclear ef 37%,  2017 per echo ef 50-55%   CHF (congestive heart  failure), NYHA class III (HCC) 03/21/2020   Coronary artery disease    GERD (gastroesophageal reflux disease)    Hiatal hernia    History of kidney stones    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;   01/ 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   History of syncope (03-06-2020 pt stated has not had sycopal episode in few years, stated it seems to happen in extreme hot conditions)   cardiologist--- dr Eloy End--- dx recurrent syncope;  nuclear study 11-03-2014 intermediate risk w/ no ischemia, apical hypokinesis, nuclear ef 37%;  event monitor-- 12-21-2015 SB/ ST  no pauses/ arrythmia's;  echo 08-03-2015 ef 50-55%   Hyperlipidemia    Hypertension    followed by pcp   Hypovitaminosis D    Mass of left testicle    Nocturia    Plantar fasciitis    Presence of surgical incision    01/ 2022  moh's w/ skin graft of nose, per pt still healing and wear bandage daily   Type 2 diabetes mellitus (HCC)    pt is adament that he is not and have been told he is a diabetic but a borderline;  followed by pcp, in pcp note states DM2 and takes 2 meds daily   Wears glasses    Wears hearing aid in both ears    Weight loss 11/16/2020  SURGICAL HISTORY: Past Surgical History:  Procedure Laterality Date   COLONOSCOPY  last one 01-30-2017   CORONARY ARTERY BYPASS GRAFT N/A 03/25/2020   Procedure: CORONARY ARTERY BYPASS GRAFTING (CABG), ON PUMP, TIMES FOUR, USING BILATERAL INTERNAL MAMMARY ARTERIES AND LEFT RADIAL ARTERY;  Surgeon: Linden Dolin, MD;  Location: [facility] OR;  Service: Open Heart Surgery;  Laterality: N/A;   ICD IMPLANT N/A 08/23/2021   Procedure: ICD IMPLANT;  Surgeon: Marinus Maw, MD;  Location: [facility] INVASIVE CV LAB;  Service: Cardiovascular;  Laterality: N/A;   IR IMAGING GUIDED PORT INSERTION  07/06/2020   LOW ANTERIOR RESECTION RECTUM W/ COLOPROCTOSTOMY  05/2003   MOHS SURGERY  01/2020   nose w/ graft   ORCHIECTOMY Left 03/11/2020   Procedure: Clearance Coots;  Surgeon: Rene Paci, MD;  Location: [facility] ([facility]);  Service: Urology;  Laterality: Left;  ONLY NEEDS 60 MIN   PORTA CATH REMOVAL N/A 08/23/2021   Procedure: PORTA CATH REMOVAL;  Surgeon: Marinus Maw, MD;  Location: [facility] INVASIVE CV LAB;  Service: Cardiovascular;  Laterality: N/A;   RADIAL ARTERY HARVEST Left 03/25/2020   Procedure: LEFT RADIAL ARTERY HARVEST;  Surgeon: Linden Dolin, MD;  Location: [facility] OR;  Service: Open Heart Surgery;  Laterality: Left;   RIGHT/LEFT HEART CATH AND CORONARY ANGIOGRAPHY N/A 03/23/2020   Procedure: RIGHT/LEFT HEART CATH AND CORONARY ANGIOGRAPHY;  Surgeon: Corky Crafts, MD;  Location: [facility] INVASIVE CV LAB;  Service: Cardiovascular;  Laterality: N/A;   SHOULDER SURGERY Right 1992; 07/ 2021   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 Left 12/24/2008   left ear and nose    TEE WITHOUT CARDIOVERSION N/A 03/25/2020   Procedure: TRANSESOPHAGEAL ECHOCARDIOGRAM (TEE);  Surgeon: Linden Dolin, MD;  Location: [facility] OR;  Service: Open Heart Surgery;  Laterality: N/A;   TONSILLECTOMY AND ADENOIDECTOMY  child   UPPER GASTROINTESTINAL ENDOSCOPY  last one 06-06-2017    SOCIAL HISTORY: Social History   Socioeconomic History   Marital status: Married    Spouse name: Not on file   Number of children: Not on file   Years of education: Not on file   Highest education level: Not on file  Occupational History   Not on file  Tobacco Use   Smoking status: Former    Types: Pipe    Quit date: 11/29/1976    Years since quitting: 46.0   Smokeless tobacco: Never  Vaping Use   Vaping status: Never Used  Substance and Sexual Activity   Alcohol use: Yes    Alcohol/week: 0.0 standard drinks of alcohol    Comment: Occasional drink    Drug use: Never   Sexual activity: Not on file  Other Topics Concern   Not on file  Social History Narrative   Not on file   Social Determinants of Health   Financial Resource Strain: Not on  file  Food Insecurity: No Food Insecurity (12/06/2022)   Hunger Vital Sign    Worried About Running Out of Food in the Last Year: Never true    Ran Out of Food in the Last Year: Never true  Transportation Needs: No Transportation Needs (12/06/2022)   PRAPARE - Administrator, Civil Service (Medical): No    Lack of Transportation (Non-Medical): No  Physical Activity: Not on file  Stress: Not on file  Social Connections: Not on file  Intimate Partner Violence: Not At Risk (12/06/2022)   Humiliation, Afraid, Rape, and Kick questionnaire  Fear of Current or Ex-Partner: No    Emotionally Abused: No    Physically Abused: No    Sexually Abused: No    FAMILY HISTORY: Family History  Problem Relation Age of Onset   Heart attack Mother    Stroke Father    Colon cancer Neg Hx    Esophageal cancer Neg Hx    Pancreatic cancer Neg Hx    Rectal cancer Neg Hx    Stomach cancer Neg Hx    Colon polyps Neg Hx     ALLERGIES:  has No Known Allergies.  MEDICATIONS:  Scheduled Meds:  aspirin EC  81 mg Oral Daily   atorvastatin  40 mg Oral Daily   dexamethasone  4 mg Oral Q12H   enoxaparin (LOVENOX) injection  40 mg Subcutaneous Q24H   famotidine  40 mg Oral Daily   feeding supplement  237 mL Oral BID BM   insulin aspart  0-15 Units Subcutaneous TID WC   insulin aspart  0-5 Units Subcutaneous QHS   insulin aspart  5 Units Subcutaneous TID WC   insulin glargine-yfgn  15 Units Subcutaneous Daily   losartan  50 mg Oral Daily   metoprolol  200 mg Oral Daily   spironolactone  12.5 mg Oral Daily   Continuous Infusions: PRN Meds:.acetaminophen **OR** acetaminophen, ondansetron **OR** ondansetron (ZOFRAN) IV, mouth rinse  REVIEW OF SYSTEMS:    10 Point review of Systems was done is negative except as noted above.   LABORATORY DATA:  I have reviewed the data as listed  .    Latest Ref Rng & Units 12/16/2022    6:10 AM 12/14/2022    5:48 AM 12/12/2022    5:50 AM  CBC   WBC 4.0 - 10.5 K/uL 15.7  15.9  17.8   Hemoglobin 13.0 - 17.0 g/dL 16.1  09.6  04.5   Hematocrit 39.0 - 52.0 % 36.3  36.8  34.0   Platelets 150 - 400 K/uL 277  286  276     .    Latest Ref Rng & Units 12/16/2022    6:10 AM 12/14/2022    5:48 AM 12/12/2022    5:50 AM  CMP  Glucose 70 - 99 mg/dL 409  811  914   BUN 8 - 23 mg/dL 41  56  56   Creatinine 0.61 - 1.24 mg/dL 7.82  9.56  2.13   Sodium 135 - 145 mmol/L 137  137  136   Potassium 3.5 - 5.1 mmol/L 4.4  4.7  4.2   Chloride 98 - 111 mmol/L 111  109  109   CO2 22 - 32 mmol/L 16  19  18    Calcium 8.9 - 10.3 mg/dL 9.0  9.1  9.1      RADIOGRAPHIC STUDIES: I have personally reviewed the radiological images as listed and agreed with the findings in the report. DG FL GUIDED LUMBAR PUNCTURE  Result Date: 12/12/2022 CLINICAL DATA:  CNS lymphoma.  New intracranial mass. EXAM: DIAGNOSTIC LUMBAR PUNCTURE UNDER FLUOROSCOPIC GUIDANCE FLUOROSCOPY: Radiation Exposure Index (as provided by the fluoroscopic device): 10.8 mGy PROCEDURE: Informed consent was obtained from the patient prior to the procedure, including potential complications of headache, allergy, brain herniation and pain. With the patient prone, the lower back was prepped with Betadine. 1% Lidocaine was used for local anesthesia. Lumbar puncture was performed at the L3-L4 level using a 20 gauge needle with return of clear CSF with an opening pressure of 15 cm water. Ten ml  of CSF were obtained for laboratory studies. The patient tolerated the procedure well and there were no apparent complications. IMPRESSION: Successful lumbar puncture. Electronically Signed   By: Genevive Bi M.D.   On: 12/12/2022 16:53   CT HEAD WO CONTRAST ( )  Result Date: 12/12/2022 CLINICAL DATA:  Brain/CNS neoplasm, assess treatment response fall and evaluation for increased ICP prior to LP per IR recommendations. EXAM: CT HEAD WITHOUT CONTRAST TECHNIQUE: Contiguous axial images were obtained from the  base of the skull through the vertex without intravenous contrast. RADIATION DOSE REDUCTION: This exam was performed according to the departmental dose-optimization program which includes automated exposure control, adjustment of the mA and/or kV according to patient size and/or use of iterative reconstruction technique. COMPARISON:  Head CT 12/07/2022.  MRI brain 12/08/2022. FINDINGS: Brain: Unchanged hyperdense masses in the anteromedial right frontal lobe and posterior aspect of the right superior frontal gyrus with surrounding vasogenic edema, again suspicious for CNS involvement of lymphoma. Mild mass effect on the right-greater-than-left frontal horns of the lateral ventricles. No hydrocephalus or herniation. No extra-axial collection. Vascular: No hyperdense vessel or unexpected calcification. Skull: No calvarial fracture or suspicious bone lesion. Skull base is unremarkable. Sinuses/Orbits: No acute findings. Other: None. IMPRESSION: 1. Unchanged hyperdense masses in the anteromedial right frontal lobe and posterior aspect of the right superior frontal gyrus with surrounding vasogenic edema, again suspicious for CNS involvement of lymphoma. 2. No hydrocephalus or herniation. Electronically Signed   By: Orvan Falconer M.D.   On: 12/12/2022 12:03   CT CHEST ABDOMEN PELVIS WO CONTRAST  Result Date: 12/10/2022 CLINICAL DATA:  Inpatient, presents with concern for recurrent large B-cell lymphoma. History of left orchiectomy for same in February 2022. Contrast withheld due to renal insufficiency. EXAM: CT CHEST, ABDOMEN AND PELVIS WITHOUT CONTRAST TECHNIQUE: Multidetector CT imaging of the chest, abdomen and pelvis was performed following the standard protocol without IV contrast. RADIATION DOSE REDUCTION: This exam was performed according to the departmental dose-optimization program which includes automated exposure control, adjustment of the mA and/or kV according to patient size and/or use of iterative  reconstruction technique. COMPARISON:  Portable chest 12/05/2022, PA Lat chest 09/02/2022, PET-CT studies 12/03/2020 and 05/11/2020, CTA chest 03/21/2020. FINDINGS: CT CHEST FINDINGS Cardiovascular: There is mild cardiomegaly again noted with a left chamber predominance. There are sternotomy sutures and CABG changes. Native coronary arteries are heavily calcified. The pulmonary trunk measures prominent at 3.2 cm indicating arterial hypertension, unchanged. There is aortic and great vessel atherosclerosis, mild amount without evidence of aortic aneurysm, with descending segment tortuosity. Pulmonary veins are nondistended. A left chest battery has been inserted since the previous PET-CT, with single lead right ventricular wire placement. Mediastinum/Nodes: Subcentimeter right paratracheal and subcarinal mediastinal lymph nodes are unchanged in the interval. No new or progressive adenopathy is seen without contrast. Thyroid is mostly obscured by metal artifact from the pacemaker/AID but normal where visible. Moderate-sized hiatal hernia is increased in size since 2022. The thoracic trachea, main bronchi, thoracic esophagus are unremarkable. Lungs/Pleura: There are new bilateral minimal layering pleural effusions, slightly greater fluid on the left. No pneumothorax. There is mild subpleural reticulation in the lung bases, but no honeycombing. There is mild posterior atelectasis in the lower lobes. No focal pneumonia is evident. No pulmonary nodules are seen. Musculoskeletal: Osteopenia and thoracic kyphosis and degenerative change. No acute or destructive osseous findings. There are subacute healing fractures of the left lateral fifth, sixth, and seventh ribs and posterolateral left eighth and ninth ribs. 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 CT ABDOMEN PELVIS FINDINGS Hepatobiliary: There are a few small layering stones in the gallbladder but no wall thickening or biliary dilatation. The  liver again demonstrating small scattered cysts, but without contrast no suspicious lesion is seen. Pancreas: There is mild pancreatic atrophy. No focal lesion is seen without contrast. Spleen: Unremarkable without contrast. Adrenals/Urinary Tract: There is no adrenal mass. There is no contour deforming mass of either unenhanced kidney. There is no urinary stone or obstruction. Bilateral perinephric fat stranding unchanged. The bladder is thickened but also not fully distended. Correlate clinically for cystitis or hypertrophy versus nondistention. Unchanged. Stomach/Bowel: Hiatal hernia. No dilatation or wall thickening including the appendix. Sigmoid diverticulosis without evidence of diverticulitis. Rectosigmoid patent surgical anastomosis is again shown. Vascular/Lymphatic: No abdominal, pelvic or inguinal adenopathy. Prior left orchiectomy. Moderate aortoiliac atherosclerosis. No AAA. Reproductive: Enlarged prostate, 5.2 cm transverse. Dystrophic calcifications left lobe chronically. Other: No abdominal wall hernia or abnormality. No abdominopelvic ascites. Musculoskeletal: Degenerative changes lumbar spine mild osteopenia. No acute or significant osseous findings. IMPRESSION: 1. No evidence of enlarged or progressive lymph nodes in the chest, abdomen or pelvis without contrast. 2. New minimal bilateral pleural effusions with mild posterior atelectasis. 3. Cardiomegaly with CABG changes. 4. Prominent pulmonary trunk indicating arterial hypertension, unchanged. 5. Moderate-sized hiatal hernia increased in size since 2022. 6. Cholelithiasis. 7. Diverticulosis without evidence of diverticulitis. 8. Prostatomegaly. 9. Subacute healing fractures of the left lateral fifth, sixth, and seventh ribs and posterolateral left eighth and ninth ribs. 10. Osteopenia and degenerative change. 11. Aortic atherosclerosis. Aortic Atherosclerosis (ICD10-I70.0). Electronically Signed   By: Almira Bar M.D.   On: 12/10/2022 02:53    MR BRAIN W WO CONTRAST  Result Date: 12/08/2022 CLINICAL DATA:  Brain/CNS neoplasm, monitor follow up right frontal mass EXAM: MRI HEAD WITHOUT AND WITH CONTRAST TECHNIQUE: Multiplanar, multiecho pulse sequences of the brain and surrounding structures were obtained without and with intravenous contrast. CONTRAST:  9mL GADAVIST GADOBUTROL 1 MMOL/ML IV SOLN COMPARISON:  Same day CT head FINDINGS: Brain: Negative for an acute infarct. No hemorrhage. No hydrocephalus. No extra-axial fluid collection. No hydrocephalus. There is a background of mild chronic microvascular ischemic change. There is a dominant contrast-enhancing mass right frontal lobe measuring 3.9 x 2.1 x 4.7 Cm (series 7, image 35). There is an additional smaller contrast-enhancing mass near the posterior limb of the internal capsule on the right measuring 9 x 8 mm (series 11, image 31). There has an additional separate contrast-enhancing lesion in the high right frontal lobe measuring 1.4 x 0.8 cm (series 11, image 48). There is T2/FLAIR hyperintense signal surrounding this lesion. There is evidence of mild diffusion restriction associated with lesions. There is mass effect on the right lateral ventricular system Vascular: Normal flow voids. Skull and upper cervical spine: Normal marrow signal. Sinuses/Orbits: Small right mastoid effusion. No middle ear effusion. Paranasal sinuses are notable for mucosal thickening in the bilateral ethmoid sinuses. Orbits are unremarkable. Other: None. IMPRESSION: Dominant contrast-enhancing mass in the right frontal lobe measuring 3.9 x 2.1 x 4.7 cm with additional smaller contrast-enhancing lesions in the high right frontal lobe and near the posterior limb of the internal capsule on the right. 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 Electronically Signed   By: Lorenza Cambridge M.D.   On: 12/08/2022 14:30   CT HEAD WO CONTRAST ( )  Result Date: 12/08/2022 CLINICAL DATA:  Neuro deficit with acute stroke  suspected. Weakness. EXAM: CT HEAD WITHOUT CONTRAST TECHNIQUE: Contiguous  axial images were obtained from the base of the skull through the vertex without intravenous contrast. RADIATION DOSE REDUCTION: This exam was performed according to the departmental dose-optimization program which includes automated exposure control, adjustment of the mA and/or kV according to patient size and/or use of iterative reconstruction technique. COMPARISON:  01/14/2021 brain MRI FINDINGS: Brain: Indistinct masslike high-density in the anterior right frontal region with adjacent vasogenic type pattern of white matter low-density and expansion. There is local mass effect. No meningioma or other mass seen in this region on prior, concerning for aggressive an intra-axial mass, possibly related to the patient's history of lymphoma. No indication of acute infarct, hemorrhage, hydrocephalus, or shift. Vascular: No hyperdense vessel or unexpected calcification. Skull: Normal. Negative for fracture or focal lesion. Sinuses/Orbits: No acute finding. IMPRESSION: Indistinct masslike area in the right frontal lobe with regional swelling and mass effect. Recommend brain MRI with contrast. Electronically Signed   By: Tiburcio Pea M.D.   On: 12/08/2022 06:27   DG Chest Port 1 View  Result Date: 12/05/2022 CLINICAL DATA:  Shortness of breath EXAM: PORTABLE CHEST 1 VIEW COMPARISON:  Chest x-ray 09/02/2022 FINDINGS: Sternotomy wires, mediastinal clips and left-sided pacemaker are again seen. The heart size and mediastinal contours are within normal limits. Both lungs are clear. The visualized skeletal structures are unremarkable. IMPRESSION: No active disease. Electronically Signed   By: Darliss Cheney M.D.   On: 12/05/2022 23:55   CUP PACEART REMOTE DEVICE CHECK  Result Date: 12/01/2022 Scheduled remote reviewed. Normal device function.  Recent increase in PVC's per trends, 918/hr over last 24hrs Thoracic impedance trending down Next  remote 91 days. LA, CVRS   ASSESSMENT & PLAN:   77 year old wonderful gentleman with history of hypertension, borderline diabetes, dyslipidemia, GERD with recent STEMI on 03/21/2020 and status post four-vessel CABG on 03/25/2020.   1) h/o Left-sided primary testicular large B-cell lymphoma diagnosed in 02/2020 S/p ipsilateral orchiectomy S/p 6 cycles of R-CEOP and 3 dose of IT MTX (for CNS prophylaxis) and R Tto contralateral testis. BCL-2, BCL 6, c-Myc negative. 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    PLAN:  -Discussed lab results on 12/16/22 in detail with patient. CBC showed WBC of 15.7K, hemoglobin of 11.9, and platelets of 277K. -patient received MRI of spine yesterday and results are still pending. If evidence of spinal involvement will define additional treatment options post WBRT, -tolerating WBRT  --continue Dexamethasone as per recommendations -PT evaluation noted. Will need acute rehab -Oncology will continue to follow  The total time spent in the appointment was 25 minutes* .  All of the patient's questions were answered with apparent satisfaction. The patient knows to call the clinic with any problems, questions or concerns.   Wyvonnia Lora MD MS AAHIVMS [facility] Hematology/Oncology Physician [facility]  .*Total Encounter Time as defined by the Centers for Medicare and Medicaid Services includes, in addition to the face-to-face time of a patient visit (documented in the note above) non-face-to-face time: obtaining and reviewing outside history, ordering and reviewing medications,  tests or procedures, care coordination (communications with other health care professionals or caregivers) and documentation in  the medical record.    I,Mitra Faeizi,acting as a Neurosurgeon for No name on file.,have documented all relevant documentation on the behalf of No name on file,as directed by  No name on file while in the presence of No name on file.  .I have reviewed the above documentation for accuracy and completeness, and I agree with the above. Wyvonnia Lora MD

## 2022-12-16 NOTE — Progress Notes (Signed)
PROGRESS NOTE  Roy [name] LKG:[id number] DOB: [birth year]   PCP: Irena Reichmann, DO  Patient is from: Home.  Lives with his wife.  DOA: 12/05/2022 LOS: 9  Chief complaints Chief Complaint  Patient presents with   Weakness     Brief Narrative / Interim history: 77 year old M with PMH of systolic CHF with ICD, CAD/CABG, remote testicular lymphoma s/p orchiectomy, CKD, DM-2, HTN and diminished hearing presented to ED with generalized weakness, falls, confusion and poor p.o. intake, and admitted for frequent PVCs, generalized weakness, falls and hypomagnesemia.  CT head showed right frontal mass.  MRI brain confirmed right frontal lobe mass suspicious for CNS lymphoma.  CT chest, abdomen and pelvis with no enlarged or progressive lymph nodes.  Oncology and radiation oncology consulted.  Patient was transferred to [facility] for further evaluation and care.   Underwent fluoroscopy guided lumbar puncture on 11/18.  CSF fluid with elevated glucose and protein.  CSF culture negative.  Cytology negative for malignant cells but cytometry with elevated CD19 and CD20.  Started radiation treatment on 11/21.  MRI spine pending.  Therapy recommended SNF once medically ready.  Subjective: Seen and examined earlier this morning.  No major events overnight of this morning.  No complaints.  Objective: Vitals:   12/16/22 0514 12/16/22 1115 12/16/22 1115 12/16/22 1317  BP: 132/81 (!) 148/82 (!) 148/82 108/72  Pulse: (!) 57 77 77 75  Resp: 14 18 18 18   Temp: (!) 97.4 F (36.3 C) (!) 97.5 F (36.4 C) (!) 97.5 F (36.4 C) (!) 97.5 F (36.4 C)  TempSrc: Oral Oral Oral Oral  SpO2: 95% 98% 98% 98%  Weight:      Height:        Examination:  GENERAL: No apparent distress.  Nontoxic. HEENT: MMM.  Vision grossly intact.  Diminished hearing. NECK: Supple.  No apparent JVD.  RESP:  No IWOB.  Fair aeration bilaterally. CVS:  RRR. Heart sounds normal.  ABD/GI/GU: BS+. Abd soft, NTND.  MSK/EXT:  Moves  extremities. No apparent deformity. No edema.  SKIN: no apparent skin lesion or wound NEURO: Awake, alert and oriented appropriately.  No apparent focal neuro deficit. PSYCH: Calm. Normal affect.  Not engaging.  Focused on breakfast.  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  MRI brain showed 3.9 x 2.1 x 4.7 cm right frontal mass with additional smaller contrast-enhancing lesions in high right frontal lobe and near the posterior limb of internal capsule on the right.  CT chest, abdomen and pelvis with contrast without evidence of metastasis -Oncology and radiation oncology on board -S/p LP on 11/18. CSF with elevated glucose & protein. Culture negative. Cytology negative for malignant cells.  Cytometry with elevated CD19 and CD20. -Started radiation treatment on 11/ 21 -Oncology recs: Decadron 4 mg Q8h for 3 days>>Q12h until he completes radiation treatment and a slow taper after that -Follow-up MRI spine   Chronic systolic CHF/cardiomyopathy with ICD: TTE 09/2022 with LVEF of 25 to 30%, GH, G1DD.  On Jardiance, Aldactone, Toprol-XL and losartan at home.  Appears euvolemic on exam.  Denies respiratory symptoms. -Continue home medications -Strict intake and output, daily weight, renal functions and electrolytes  CKD-3B: Cr better than baseline Recent Labs    11/07/22 1238 12/05/22 2134 12/06/22 0506 12/07/22 0213 12/08/22 0227 12/09/22 0811 12/11/22 1145 12/12/22 0550 12/14/22 0548 12/16/22 0610  BUN 23  13 12 17 14 17  45* 56* 56* 41*  CREATININE 1.44* 1.31* 1.41* 1.75* 1.49* 1.32* 1.69* 1.68* 1.46* 1.14  -Continue monitoring  NIDDM-2 with hyperglycemia: A1c 6.2%.  Hyperglycemia partly due to steroid.  Recent Labs  Lab 12/15/22 1131 12/15/22 1626 12/15/22 2053 12/16/22 0753 12/16/22 1111  GLUCAP  181* 192* 128* 160* 242*  -Continue SSI moderate -Continue semaglutide at 15 units daily. -Continue NovoLog at 5 units 3 times daily with meals -Further adjustment as appropriate.  Anticipate improvement with decreased steroid  Acute metabolic encephalopathy, multifactorial.  Seems to have resolved. 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 -Per oncology  CAD/CABG: Stable -Continue home meds  Hypokalemia/hypomagnesemia -Monitor replenish as appropriate  Generalized weakness/frequent falls/fall in the hospital: Patient "slid down to the floor into sitting position" when he got up to go to bathroom the night of 11/20.  No fall with impact.  No injury.  CT head after fall without acute finding -Fall precaution in place -PT/OT   Essential hypertension: Normotensive -Continue Toprol-XL, losartan, Aldactone  PVCs: No ventricular tachycardia. -Continue Toprol-XL -Cardiology recommended outpatient follow-up and signed off. -Optimize electrolytes        Body mass index is 25.47 kg/m.         DVT prophylaxis:  enoxaparin (LOVENOX) injection 40 mg Start: 12/06/22 1600  Code Status: Full code Family Communication: None at bedside today. Level of care: Med-Surg Status is: Inpatient Remains inpatient appropriate because: Brain mass concerning for recurrence of lymphoma requiring radiation therapy   Final disposition: SNF Consultants:  Oncology Cardiology Radiation oncology  35 minutes with more than 50% spent in reviewing records, counseling patient/family and coordinating care.   Sch Meds:  Scheduled Meds:  aspirin EC  81 mg Oral Daily   atorvastatin  40 mg Oral Daily   dexamethasone  4 mg Oral Q12H   enoxaparin (LOVENOX) injection  40 mg Subcutaneous Q24H   famotidine  40 mg  Oral Daily   feeding supplement  237 mL Oral BID BM   insulin aspart  0-15 Units Subcutaneous TID WC   insulin aspart  0-5 Units Subcutaneous QHS   insulin aspart  5 Units Subcutaneous TID WC   insulin glargine-yfgn  15 Units Subcutaneous Daily   losartan  50 mg Oral Daily   metoprolol  200 mg Oral Daily   spironolactone  12.5 mg Oral Daily   Continuous Infusions: PRN Meds:.acetaminophen **OR** acetaminophen, ondansetron **OR** ondansetron (ZOFRAN) IV, mouth rinse  Antimicrobials: Anti-infectives (From admission, onward)    None        I have personally reviewed the following labs and images: CBC: Recent Labs  Lab 12/12/22 0550 12/14/22 0548 12/16/22 0610  WBC 17.8* 15.9* 15.7*  HGB 10.9* 11.5* 11.9*  HCT 34.0* 36.8* 36.3*  MCV 93.7 94.8 92.6  PLT 276 286 277   BMP &GFR Recent Labs  Lab 12/11/22 1145 12/12/22 0550 12/14/22 0548 12/16/22 0610  NA 136 136 137 137  K 4.8 4.2 4.7 4.4  CL 106 109 109 111  CO2 15* 18* 19* 16*  GLUCOSE 258* 208* 211* 164*  BUN 45* 56* 56* 41*  CREATININE 1.69* 1.68* 1.46* 1.14  CALCIUM 9.7 9.1 9.1 9.0  MG  --  1.9 2.0 1.9  PHOS  --  3.5 3.0 3.6   Estimated Creatinine Clearance: 57.7 mL/min (by C-G formula based  on SCr of 1.14 mg/dL). Liver & Pancreas: Recent Labs  Lab 12/12/22 0550 12/14/22 0548 12/16/22 0610  ALBUMIN 3.3* 3.4* 3.1*   No results for input(s): "LIPASE", "AMYLASE" in the last 168 hours. No results for input(s): "AMMONIA" in the last 168 hours. Diabetic: No results for input(s): "HGBA1C" in the last 72 hours. Recent Labs  Lab 12/15/22 1131 12/15/22 1626 12/15/22 2053 12/16/22 0753 12/16/22 1111  GLUCAP 181* 192* 128* 160* 242*   Cardiac Enzymes: No results for input(s): "CKTOTAL", "CKMB", "CKMBINDEX", "TROPONINI" in the last 168 hours. Recent Labs    09/02/22 1000  PROBNP 609*   Coagulation Profile: No results for input(s): "INR", "PROTIME" in the last 168 hours. Thyroid Function Tests: No  results for input(s): "TSH", "T4TOTAL", "FREET4", "T3FREE", "THYROIDAB" in the last 72 hours. Lipid Profile: No results for input(s): "CHOL", "HDL", "LDLCALC", "TRIG", "CHOLHDL", "LDLDIRECT" in the last 72 hours. Anemia Panel: No results for input(s): "VITAMINB12", "FOLATE", "FERRITIN", "TIBC", "IRON", "RETICCTPCT" in the last 72 hours. Urine analysis:    Component Value Date/Time   COLORURINE AMBER (A) 12/06/2022 0125   APPEARANCEUR HAZY (A) 12/06/2022 0125   LABSPEC 1.028 12/06/2022 0125   PHURINE 5.0 12/06/2022 0125   GLUCOSEU >=500 (A) 12/06/2022 0125   HGBUR NEGATIVE 12/06/2022 0125   BILIRUBINUR NEGATIVE 12/06/2022 0125   KETONESUR 5 (A) 12/06/2022 0125   PROTEINUR NEGATIVE 12/06/2022 0125   UROBILINOGEN 1.0 03/12/2013 1936   NITRITE NEGATIVE 12/06/2022 0125   LEUKOCYTESUR NEGATIVE 12/06/2022 0125   Sepsis Labs: Invalid input(s): "PROCALCITONIN", "LACTICIDVEN"  Microbiology: Recent Results (from the past 240 hour(s))  MRSA Next Gen by PCR, Nasal     Status: None   Collection Time: 12/06/22  2:49 PM   Specimen: Nasal Mucosa; Nasal Swab  Result Value Ref Range Status   MRSA by PCR Next Gen NOT DETECTED NOT DETECTED Final    Comment: (NOTE) The GeneXpert MRSA Assay (FDA approved for NASAL specimens only), is one component of a comprehensive MRSA colonization surveillance program. It is not intended to diagnose MRSA infection nor to guide or monitor treatment for MRSA infections. Test performance is not FDA approved in patients less than 58 years old. Performed at [facility], [address], [place], [place] [address]   CSF culture w Gram Stain     Status: None   Collection Time: 12/12/22  3:44 PM   Specimen: PATH Cytology CSF; Cerebrospinal Fluid  Result Value Ref Range Status   Specimen Description   Final    CSF Performed at [facility], [address], [place], [place] [address]    Special Requests   Final    NONE Performed at [facility], [address], [place], [place] [address]    Gram Stain   Final    NO WBC SEEN NO ORGANISMS SEEN CYTOSPIN SMEAR Performed at [facility], [address] [place], [place] [address]    Culture   Final    NO GROWTH 3 DAYS Performed at [facility], [address], [place], [place] [address]    Report Status 12/16/2022 FINAL  Final    Radiology Studies: No results found.    Suellen Durocher T. Anaise Sterbenz [facility]  If 7PM-7AM, please contact night-coverage [email or web address] 12/16/2022, 2:10 PM

## 2022-12-17 DIAGNOSIS — N1832 Chronic kidney disease, stage 3b: Secondary | ICD-10-CM | POA: Diagnosis not present

## 2022-12-17 DIAGNOSIS — C8330 Diffuse large B-cell lymphoma, unspecified site: Secondary | ICD-10-CM | POA: Diagnosis not present

## 2022-12-17 DIAGNOSIS — C8339 Primary central nervous system lymphoma: Secondary | ICD-10-CM | POA: Diagnosis not present

## 2022-12-17 DIAGNOSIS — G9389 Other specified disorders of brain: Secondary | ICD-10-CM | POA: Diagnosis not present

## 2022-12-17 LAB — GLUCOSE, CAPILLARY
Glucose-Capillary: 157 mg/dL — ABNORMAL HIGH (ref 70–99)
Glucose-Capillary: 163 mg/dL — ABNORMAL HIGH (ref 70–99)
Glucose-Capillary: 142 mg/dL — ABNORMAL HIGH (ref 70–99)
Glucose-Capillary: 207 mg/dL — ABNORMAL HIGH (ref 70–99)

## 2022-12-17 NOTE — Progress Notes (Signed)
Mobility Specialist - Progress Note   12/17/22 1134  Mobility  Activity Ambulated with assistance in hallway  Level of Assistance Standby assist, set-up cues, supervision of patient - no hands on  Assistive Device Front wheel walker  Distance Ambulated (ft) 250 ft  Activity Response Tolerated well  Mobility Referral Yes  $Mobility charge 1 Mobility  Mobility Specialist Start Time (ACUTE ONLY) 1111  Mobility Specialist Stop Time (ACUTE ONLY) 1122  Mobility Specialist Time Calculation (min) (ACUTE ONLY) 11 min   Pt received in bed and agreeable to mobility. Pt was minA from supine>sitting. No complaints during session. Pt to recliner after session with all needs met.   [facility]

## 2022-12-17 NOTE — Progress Notes (Signed)
PROGRESS NOTE  Roy [name] NWG:[id number] DOB: 1945/02/06   PCP: Irena Reichmann, DO  Patient is from: Home.  Lives with his wife.  DOA: 12/05/2022 LOS: 10  Chief complaints Chief Complaint  Patient presents with   Weakness     Brief Narrative / Interim history: 77 year old M with PMH of systolic CHF with ICD, CAD/CABG, remote testicular lymphoma s/p orchiectomy, CKD, DM-2, HTN and diminished hearing presented to ED with generalized weakness, falls, confusion and poor p.o. intake, and admitted for frequent PVCs, generalized weakness, falls and hypomagnesemia.  CT head showed right frontal mass.  MRI brain confirmed right frontal lobe mass suspicious for CNS lymphoma.  CT chest, abdomen and pelvis with no enlarged or progressive lymph nodes.  Oncology and radiation oncology consulted.  Patient was transferred to [facility] for further evaluation and care.   Underwent fluoroscopy guided lumbar puncture on 11/18.  CSF fluid with elevated glucose and protein.  CSF culture negative.  Cytology negative for malignant cells but cytometry with elevated CD19 and CD20.  Started radiation treatment on 11/21.  MRI spine pending.  Therapy recommended SNF once medically ready.  Subjective: Seen and examined earlier this morning.  No major events overnight of this morning.  No complaints.  Objective: Vitals:   12/16/22 1115 12/16/22 1317 12/16/22 2145 12/17/22 0454  BP: (!) 148/82 108/72 116/70 127/81  Pulse: 77 75 80 62  Resp: 18 18 20 20   Temp: (!) 97.5 F (36.4 C) (!) 97.5 F (36.4 C) 98.6 F (37 C) 97.7 F (36.5 C)  TempSrc: Oral Oral Oral Oral  SpO2: 98% 98% 99% 97%  Weight:      Height:        Examination:  GENERAL: No apparent distress.  Nontoxic. HEENT: MMM.  Vision grossly intact.  Diminished hearing. NECK: Supple.  No apparent JVD.  RESP:  No IWOB.  Fair aeration bilaterally. CVS:  RRR. Heart sounds normal.  ABD/GI/GU: BS+. Abd soft, NTND.  MSK/EXT:  Moves extremities. No  apparent deformity. No edema.  SKIN: no apparent skin lesion or wound NEURO: Awake, alert and oriented appropriately.  No apparent focal neuro deficit. PSYCH: Calm. Normal affect.  Not engaging.  Focused on breakfast.  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  MRI brain showed 3.9 x 2.1 x 4.7 cm right frontal mass with additional smaller contrast-enhancing lesions in high right frontal lobe and near the posterior limb of internal capsule on the right.  CT chest, abdomen and pelvis with contrast without evidence of metastasis -Oncology and radiation oncology on board -S/p LP on 11/18. CSF with elevated glucose & protein. Culture negative. Cytology negative for malignant cells.  Cytometry with elevated CD19 and CD20. -Started radiation treatment on 11/ 21 -Oncology recs: Decadron 4 mg Q8h for 3 days>>Q12h until he completes radiation treatment and a slow taper after that -Follow-up MRI spine   Chronic systolic CHF/cardiomyopathy with ICD: TTE 09/2022 with LVEF of 25 to 30%, GH, G1DD.  On Jardiance, Aldactone, Toprol-XL and losartan at home.  Appears euvolemic on exam.  Denies respiratory symptoms. -Continue home medications -Strict intake and output, daily weight, renal functions and electrolytes  CKD-3B: Cr better than baseline Recent Labs    11/07/22 1238 12/05/22 2134 12/06/22 0506 12/07/22 0213 12/08/22 0227 12/09/22 0811 12/11/22 1145 12/12/22 0550 12/14/22 0548 12/16/22 0610  BUN 23 13 12 17  14  17 45* 56* 56* 41*  CREATININE 1.44* 1.31* 1.41* 1.75* 1.49* 1.32* 1.69* 1.68* 1.46* 1.14  -Continue monitoring  NIDDM-2 with hyperglycemia: A1c 6.2%.  Hyperglycemia partly due to steroid.  Recent Labs  Lab 12/16/22 1111 12/16/22 1645 12/16/22 2147 12/17/22 0740 12/17/22 1150  GLUCAP 242* 266* 310* 157*  163*  -Continue SSI moderate -Continue semaglutide at 15 units daily. -Continue NovoLog at 5 units 3 times daily with meals -Further adjustment as appropriate.  Anticipate improvement with decreased steroid  Acute metabolic encephalopathy: multifactorial.  Seems to have resolved. 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 -Per oncology  CAD/CABG: Stable -Continue home meds  Hypokalemia/hypomagnesemia -Monitor replenish as appropriate  Generalized weakness/frequent falls/fall in the hospital: Patient "slid down to the floor into sitting position" when he got up to go to bathroom the night of 11/20.  No fall with impact.  No injury.  CT head after fall without acute finding -Fall precaution in place -PT/OT-recommended SNF  Essential hypertension: Normotensive -Continue Toprol-XL, losartan, Aldactone  PVCs: No ventricular tachycardia. -Continue Toprol-XL -Cardiology recommended outpatient follow-up and signed off. -Optimize electrolytes        Body mass index is 25.47 kg/m.         DVT prophylaxis:  enoxaparin (LOVENOX) injection 40 mg Start: 12/06/22 1600  Code Status: Full code Family Communication: None at bedside today. Level of care: Med-Surg Status is: Inpatient Remains inpatient appropriate because: Brain mass concerning for recurrence of lymphoma requiring radiation therapy   Final disposition: SNF Consultants:  Oncology Cardiology Radiation oncology  35 minutes with more than 50% spent in reviewing records, counseling patient/family and coordinating care.   Sch Meds:  Scheduled Meds:  aspirin EC  81 mg Oral Daily   atorvastatin  40 mg Oral Daily   dexamethasone  4 mg Oral Q12H   enoxaparin (LOVENOX) injection  40 mg Subcutaneous Q24H   famotidine  40 mg Oral  Daily   feeding supplement  237 mL Oral BID BM   insulin aspart  0-15 Units Subcutaneous TID WC   insulin aspart  0-5 Units Subcutaneous QHS   insulin aspart  5 Units Subcutaneous TID WC   insulin glargine-yfgn  15 Units Subcutaneous Daily   losartan  50 mg Oral Daily   metoprolol  200 mg Oral Daily   spironolactone  12.5 mg Oral Daily   Continuous Infusions: PRN Meds:.acetaminophen **OR** acetaminophen, ondansetron **OR** ondansetron (ZOFRAN) IV, mouth rinse  Antimicrobials: Anti-infectives (From admission, onward)    None        I have personally reviewed the following labs and images: CBC: Recent Labs  Lab 12/12/22 0550 12/14/22 0548 12/16/22 0610  WBC 17.8* 15.9* 15.7*  HGB 10.9* 11.5* 11.9*  HCT 34.0* 36.8* 36.3*  MCV 93.7 94.8 92.6  PLT 276 286 277   BMP &GFR Recent Labs  Lab 12/11/22 1145 12/12/22 0550 12/14/22 0548 12/16/22 0610  NA 136 136 137 137  K 4.8 4.2 4.7 4.4  CL 106 109 109 111  CO2 15* 18* 19* 16*  GLUCOSE 258* 208* 211* 164*  BUN 45* 56* 56* 41*  CREATININE 1.69* 1.68* 1.46* 1.14  CALCIUM 9.7 9.1 9.1 9.0  MG  --  1.9 2.0 1.9  PHOS  --  3.5 3.0 3.6   Estimated Creatinine Clearance: 57.7 mL/min (by C-G formula based on SCr of 1.14  mg/dL). Liver & Pancreas: Recent Labs  Lab 12/12/22 0550 12/14/22 0548 12/16/22 0610  ALBUMIN 3.3* 3.4* 3.1*   No results for input(s): "LIPASE", "AMYLASE" in the last 168 hours. No results for input(s): "AMMONIA" in the last 168 hours. Diabetic: No results for input(s): "HGBA1C" in the last 72 hours. Recent Labs  Lab 12/16/22 1111 12/16/22 1645 12/16/22 2147 12/17/22 0740 12/17/22 1150  GLUCAP 242* 266* 310* 157* 163*   Cardiac Enzymes: No results for input(s): "CKTOTAL", "CKMB", "CKMBINDEX", "TROPONINI" in the last 168 hours. Recent Labs    09/02/22 1000  PROBNP 609*   Coagulation Profile: No results for input(s): "INR", "PROTIME" in the last 168 hours. Thyroid Function Tests: No results  for input(s): "TSH", "T4TOTAL", "FREET4", "T3FREE", "THYROIDAB" in the last 72 hours. Lipid Profile: No results for input(s): "CHOL", "HDL", "LDLCALC", "TRIG", "CHOLHDL", "LDLDIRECT" in the last 72 hours. Anemia Panel: No results for input(s): "VITAMINB12", "FOLATE", "FERRITIN", "TIBC", "IRON", "RETICCTPCT" in the last 72 hours. Urine analysis:    Component Value Date/Time   COLORURINE AMBER (A) 12/06/2022 0125   APPEARANCEUR HAZY (A) 12/06/2022 0125   LABSPEC 1.028 12/06/2022 0125   PHURINE 5.0 12/06/2022 0125   GLUCOSEU >=500 (A) 12/06/2022 0125   HGBUR NEGATIVE 12/06/2022 0125   BILIRUBINUR NEGATIVE 12/06/2022 0125   KETONESUR 5 (A) 12/06/2022 0125   PROTEINUR NEGATIVE 12/06/2022 0125   UROBILINOGEN 1.0 03/12/2013 1936   NITRITE NEGATIVE 12/06/2022 0125   LEUKOCYTESUR NEGATIVE 12/06/2022 0125   Sepsis Labs: Invalid input(s): "PROCALCITONIN", "LACTICIDVEN"  Microbiology: Recent Results (from the past 240 hour(s))  CSF culture w Gram Stain     Status: None   Collection Time: 12/12/22  3:44 PM   Specimen: PATH Cytology CSF; Cerebrospinal Fluid  Result Value Ref Range Status   Specimen Description   Final    CSF Performed at [facility], [address] [place], [place] [address]    Special Requests   Final    NONE Performed at [facility], [address] [place], [place] [address]    Gram Stain   Final    NO WBC SEEN NO ORGANISMS SEEN CYTOSPIN SMEAR Performed at [facility], [address] [place], [place] [address]    Culture   Final    NO GROWTH 3 DAYS Performed at [facility], [address], [place], [place] [address]    Report Status 12/16/2022 FINAL  Final    Radiology Studies: No results found.    Trevonn Hallum T. Torrion Witter [facility]  If 7PM-7AM, please contact night-coverage [email or web address] 12/17/2022, 1:39 PM

## 2022-12-18 DIAGNOSIS — N1832 Chronic kidney disease, stage 3b: Secondary | ICD-10-CM | POA: Diagnosis not present

## 2022-12-18 DIAGNOSIS — G9389 Other specified disorders of brain: Secondary | ICD-10-CM | POA: Diagnosis not present

## 2022-12-18 DIAGNOSIS — C83398 Diffuse large b-cell lymphoma of other extranodal and solid organ sites: Secondary | ICD-10-CM | POA: Diagnosis not present

## 2022-12-18 DIAGNOSIS — C8339 Primary central nervous system lymphoma: Secondary | ICD-10-CM | POA: Diagnosis not present

## 2022-12-18 LAB — GLUCOSE, CAPILLARY
Glucose-Capillary: 173 mg/dL — ABNORMAL HIGH (ref 70–99)
Glucose-Capillary: 140 mg/dL — ABNORMAL HIGH (ref 70–99)
Glucose-Capillary: 142 mg/dL — ABNORMAL HIGH (ref 70–99)

## 2022-12-18 LAB — CBC
HCT: 36.9 % — ABNORMAL LOW (ref 39.0–52.0)
Hemoglobin: 11.7 g/dL — ABNORMAL LOW (ref 13.0–17.0)
MCH: 29.6 pg (ref 26.0–34.0)
MCHC: 31.7 g/dL (ref 30.0–36.0)
MCV: 93.4 fL (ref 80.0–100.0)
Platelets: 280 10*3/uL (ref 150–400)
RBC: 3.95 MIL/uL — ABNORMAL LOW (ref 4.22–5.81)
RDW: 12.7 % (ref 11.5–15.5)
WBC: 16.4 10*3/uL — ABNORMAL HIGH (ref 4.0–10.5)
nRBC: 0 % (ref 0.0–0.2)

## 2022-12-18 LAB — RENAL FUNCTION PANEL
Albumin: 3.1 g/dL — ABNORMAL LOW (ref 3.5–5.0)
Anion gap: 5 (ref 5–15)
BUN: 43 mg/dL — ABNORMAL HIGH (ref 8–23)
CO2: 20 mmol/L — ABNORMAL LOW (ref 22–32)
Calcium: 8.6 mg/dL — ABNORMAL LOW (ref 8.9–10.3)
Chloride: 106 mmol/L (ref 98–111)
Creatinine, Ser: 1.48 mg/dL — ABNORMAL HIGH (ref 0.61–1.24)
GFR, Estimated: 48 mL/min — ABNORMAL LOW (ref >60)
Glucose, Bld: 174 mg/dL — ABNORMAL HIGH (ref 70–99)
Phosphorus: 3.6 mg/dL (ref 2.5–4.6)
Potassium: 4.5 mmol/L (ref 3.5–5.1)
Sodium: 131 mmol/L — ABNORMAL LOW (ref 135–145)

## 2022-12-18 LAB — MAGNESIUM: Magnesium: 2.1 mg/dL (ref 1.7–2.4)

## 2022-12-18 MED ORDER — INSULIN GLARGINE-YFGN 100 UNIT/ML ~~LOC~~ SOLN
20.0000 [IU] | Freq: Every day | SUBCUTANEOUS | Status: DC
  Administered 2022-12-19 – 2022-12-25 (×7): 20 [IU] via SUBCUTANEOUS
  Filled 2022-12-18 (×7): qty 0.2

## 2022-12-18 NOTE — Progress Notes (Signed)
Mobility Specialist - Progress Note   12/18/22 1345  Mobility  Activity Ambulated with assistance in hallway  Level of Assistance Standby assist, set-up cues, supervision of patient - no hands on  Assistive Device Front wheel walker  Distance Ambulated (ft) 300 ft  Activity Response Tolerated well  Mobility Referral Yes  $Mobility charge 1 Mobility  Mobility Specialist Start Time (ACUTE ONLY) 1326  Mobility Specialist Stop Time (ACUTE ONLY) 1342  Mobility Specialist Time Calculation (min) (ACUTE ONLY) 16 min   Pt received in bed and agreeable to mobility. Pt took x1 seated rest break d/t fatigue. No complaints during session. Pt to recliner after session with all needs met.    [facility]

## 2022-12-18 NOTE — Progress Notes (Signed)
PROGRESS NOTE    Roy [name]  WGN:[id number] DOB: 1945/11/03 DOA: 12/05/2022 PCP: Irena Reichmann, DO   Brief Narrative:  77 year old M with PMH of systolic CHF with ICD, CAD/CABG, remote testicular lymphoma s/p orchiectomy, CKD, DM-2, HTN and diminished hearing presented to ED with generalized weakness, falls, confusion and poor p.o. intake, and admitted for frequent PVCs, generalized weakness, falls and hypomagnesemia.  CT head showed right frontal mass.  MRI brain confirmed right frontal lobe mass suspicious for CNS lymphoma.  CT chest, abdomen and pelvis with no enlarged or progressive lymph nodes.  Oncology and radiation oncology consulted.  Patient was transferred to [facility] for further evaluation and care.   Underwent fluoroscopy guided lumbar puncture on 11/18.  CSF fluid with elevated glucose and protein.  CSF culture negative.  Cytology negative for malignant cells but cytometry with elevated CD19 and CD20.  Started radiation treatment on 11/21.  MRI spine pending.  Therapy recommended SNF once medically ready.  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  MRI brain showed 3.9 x 2.1 x 4.7 cm right frontal mass with additional smaller contrast-enhancing lesions in high right frontal lobe and near the posterior limb of internal capsule on the right.  CT chest, abdomen and pelvis with contrast without evidence of metastasis -Oncology and radiation oncology on board -S/p LP on 11/18. CSF with elevated glucose & protein. Culture negative. Cytology negative for malignant cells.  Cytometry with elevated CD19 and  CD20. -Started radiation treatment on 11/ 21, plan for 17 sessions, per wife oncology has told them that he will be staying in the hospital and completing all sessions before discharge.  Per oncology recommendation, he received Decadron 4 mg Q8h for 3 days>>Q12h starting 12/16/2022 for 20 days.  MRI spine completed, results still pending.   Chronic systolic CHF/cardiomyopathy with ICD: TTE 09/2022 with LVEF of 25 to 30%, GH, G1DD.  On Jardiance, Aldactone, Toprol-XL and losartan at home.  Appears euvolemic on exam.  Denies respiratory symptoms. -Continue home medications -Strict intake and output, daily weight, renal functions and electrolytes   CKD-3B: Cr better than baseline   NIDDM-2 with hyperglycemia: A1c 6.2%.  Slightly hyperglycemia partly due to steroid.  Increase Semglee to 20 units and continue NovoLog at 5 units 3 times daily with meals.   Acute metabolic encephalopathy: multifactorial.  Seems to have resolved.  Diffuse large B cell lymphoma- left testicular cancer s/p orchiectomy-now with right frontal mass concerning for relapse.  Oncology on board.   CAD/CABG: Stable -Continue home meds   Hypokalemia/hypomagnesemia -Monitor replenish as appropriate   Generalized weakness/frequent falls/fall in the hospital: Patient "slid down to the floor into sitting position" when he got up to go to bathroom the night of 11/20.  No fall with impact.  No injury.  CT head after fall without acute finding.  Evaluated by PT OT, SNF recommended at discharge.   Essential hypertension: Normotensive -Continue Toprol-XL, losartan, Aldactone   PVCs: No ventricular tachycardia. -Continue Toprol-XL -Cardiology recommended outpatient follow-up and signed off. -Optimize electrolytes  DVT prophylaxis: enoxaparin (LOVENOX) injection 40 mg Start: 12/06/22 1600  Code Status: Full Code  Family Communication:  wife present at bedside.  Plan of care discussed with patient in length and he/she verbalized  understanding and agreed with it.  Status is: Inpatient Remains inpatient appropriate because: Needs to remain in the hospital until completion of radiation therapy.   Estimated body mass index is 25.47 kg/m as calculated from the following:   Height as of this encounter: 6' (1.829 m).   Weight as of this encounter: 85.2 kg.    Nutritional Assessment: Body mass index is 25.47 kg/m.[name] Seen by dietician.  I agree with the assessment and plan as outlined below: Nutrition Status:        . Skin Assessment: I have examined the patient's skin and I agree with the wound assessment as performed by the wound care RN as outlined below:    Consultants:  Oncology and radiation therapy  Procedures:  As above  Antimicrobials:  Anti-infectives (From admission, onward)    None         Subjective: Seen and examined.  Wife at the bedside.  He has no complaints.  Objective: Vitals:   12/17/22 0454 12/17/22 1510 12/17/22 2118 12/18/22 0557  BP: 127/81 131/83 116/67 124/76  Pulse: 62 (!) 54 64 (!) 59  Resp: 20 17 20 20   Temp: 97.7 F (36.5 C) 97.8 F (36.6 C) 97.7 F (36.5 C) 98 F (36.7 C)  TempSrc: Oral Oral Oral   SpO2: 97% 99% 97% 99%  Weight:      Height:        Intake/Output Summary (Last 24 hours) at 12/18/2022 1152 Last data filed at 12/18/2022 0740 Gross per 24 hour  Intake 480 ml  Output 625 ml  Net -145 ml   Filed Weights   12/07/22 0417 12/09/22 0506 12/13/22 1100  Weight: 90.7 kg 89.6 kg 85.2 kg    Examination:  General exam: Appears calm and comfortable  Respiratory system: Clear to auscultation. Respiratory effort normal. Cardiovascular system: S1 & S2 heard, RRR. No JVD, murmurs, rubs, gallops or clicks. No pedal edema. Gastrointestinal system: Abdomen is nondistended, soft and nontender. No organomegaly or masses felt. Normal bowel sounds heard. Central nervous system: Alert and oriented. No focal neurological deficits. Extremities: Symmetric  5 x 5 power. Skin: No rashes, lesions or ulcers Psychiatry: Judgement and insight appear normal. Mood & affect appropriate.    Data Reviewed: I have personally reviewed following labs and imaging studies  CBC: Recent Labs  Lab 12/12/22 0550 12/14/22 0548 12/16/22 0610 12/18/22 0601  WBC 17.8* 15.9* 15.7* 16.4*  HGB 10.9* 11.5* 11.9* 11.7*  HCT 34.0* 36.8* 36.3* 36.9*  MCV 93.7 94.8 92.6 93.4  PLT 276 286 277 280   Basic Metabolic Panel: Recent Labs  Lab 12/12/22 0550 12/14/22 0548 12/16/22 0610 12/18/22 0601  NA 136 137 137 131*  K 4.2 4.7 4.4 4.5  CL 109 109 111 106  CO2 18* 19* 16* 20*  GLUCOSE 208* 211* 164* 174*  BUN 56* 56* 41* 43*  CREATININE 1.68* 1.46* 1.14 1.48*  CALCIUM 9.1 9.1 9.0 8.6*  MG 1.9 2.0 1.9 2.1  PHOS 3.5 3.0 3.6 3.6   GFR: Estimated Creatinine Clearance: 44.4 mL/min (A) (by C-G formula based on SCr of 1.48 mg/dL (H)). Liver Function Tests: Recent Labs  Lab 12/12/22 0550 12/14/22 0548 12/16/22 0610 12/18/22 0601  ALBUMIN 3.3* 3.4* 3.1* 3.1*   No results for input(s): "LIPASE", "AMYLASE" in the last 168 hours. No results for input(s): "AMMONIA" in the last  168 hours. Coagulation Profile: No results for input(s): "INR", "PROTIME" in the last 168 hours. Cardiac Enzymes: No results for input(s): "CKTOTAL", "CKMB", "CKMBINDEX", "TROPONINI" in the last 168 hours. BNP (last 3 results) Recent Labs    09/02/22 1000  PROBNP 609*   HbA1C: No results for input(s): "HGBA1C" in the last 72 hours. CBG: Recent Labs  Lab 12/17/22 0740 12/17/22 1150 12/17/22 1721 12/17/22 2117 12/18/22 1107  GLUCAP 157* 163* 142* 207* 173*   Lipid Profile: No results for input(s): "CHOL", "HDL", "LDLCALC", "TRIG", "CHOLHDL", "LDLDIRECT" in the last 72 hours. Thyroid Function Tests: No results for input(s): "TSH", "T4TOTAL", "FREET4", "T3FREE", "THYROIDAB" in the last 72 hours. Anemia Panel: No results for input(s): "VITAMINB12", "FOLATE", "FERRITIN",  "TIBC", "IRON", "RETICCTPCT" in the last 72 hours. Sepsis Labs: No results for input(s): "PROCALCITON", "LATICACIDVEN" in the last 168 hours.  Recent Results (from the past 240 hour(s))  CSF culture w Gram Stain     Status: None   Collection Time: 12/12/22  3:44 PM   Specimen: PATH Cytology CSF; Cerebrospinal Fluid  Result Value Ref Range Status   Specimen Description   Final    CSF Performed at [facility], [address], [place], [place] [address]    Special Requests   Final    NONE Performed at [facility], [address], [place], [place] [address]    Gram Stain   Final    NO WBC SEEN NO ORGANISMS SEEN CYTOSPIN SMEAR Performed at [facility], [address], [place], [place] [address]    Culture   Final    NO GROWTH 3 DAYS Performed at [facility], [address] [place], [place] [address]    Report Status 12/16/2022 FINAL  Final     Radiology Studies: No results found.  Scheduled Meds:  aspirin EC  81 mg Oral Daily   atorvastatin  40 mg Oral Daily   dexamethasone  4 mg Oral Q12H   enoxaparin (LOVENOX) injection  40 mg Subcutaneous Q24H   famotidine  40 mg Oral Daily   feeding supplement  237 mL Oral BID BM   insulin aspart  0-15 Units Subcutaneous TID WC   insulin aspart  0-5 Units Subcutaneous QHS   insulin aspart  5 Units Subcutaneous TID WC   insulin glargine-yfgn  15 Units Subcutaneous Daily   losartan  50 mg Oral Daily   metoprolol  200 mg Oral Daily   spironolactone  12.5 mg Oral Daily   Continuous Infusions:   LOS: 11 days   Hughie Closs, MD [facility]  12/18/2022, 11:52 AM   *Please note that this is a verbal dictation therefore any spelling or grammatical errors are due to the "Dragon Medical One" system interpretation.  Please page via Amion and do not message via secure chat for urgent patient care matters. Secure chat can be used for non urgent patient care  matters.  How to contact the [facility] Attending or Consulting provider 7A - 7P or covering provider during after hours 7P -7A, for this patient?  Check the care team in [facility] and look for a) attending/consulting TRH provider listed and b) the [facility] team listed. Page or secure chat 7A-7P. Log into [email or web address] and use [facility]'s universal password to access. If you do not have the password, please contact the hospital operator. Locate the [facility] provider you are looking for under [facility] and page to a number that you can be directly reached.  If you still have difficulty reaching the provider, please page the [facility] (Director on Call) for the Hospitalists listed on amion for assistance.

## 2022-12-19 ENCOUNTER — Other Ambulatory Visit: Payer: Self-pay

## 2022-12-19 ENCOUNTER — Ambulatory Visit
Admit: 2022-12-19 | Discharge: 2022-12-19 | Disposition: A | Discharge status: Home / Self Care | Payer: Medicare HMO | Attending: Radiation Oncology | Admitting: Radiation Oncology

## 2022-12-19 DIAGNOSIS — G9389 Other specified disorders of brain: Secondary | ICD-10-CM | POA: Diagnosis not present

## 2022-12-19 DIAGNOSIS — N1832 Chronic kidney disease, stage 3b: Secondary | ICD-10-CM | POA: Diagnosis not present

## 2022-12-19 DIAGNOSIS — I5022 Chronic systolic (congestive) heart failure: Secondary | ICD-10-CM | POA: Diagnosis not present

## 2022-12-19 LAB — GLUCOSE, CAPILLARY
Glucose-Capillary: 161 mg/dL — ABNORMAL HIGH (ref 70–99)
Glucose-Capillary: 146 mg/dL — ABNORMAL HIGH (ref 70–99)
Glucose-Capillary: 224 mg/dL — ABNORMAL HIGH (ref 70–99)
Glucose-Capillary: 129 mg/dL — ABNORMAL HIGH (ref 70–99)
Glucose-Capillary: 163 mg/dL — ABNORMAL HIGH (ref 70–99)

## 2022-12-19 LAB — RAD ONC ARIA SESSION SUMMARY
Course Elapsed Days: 5
Plan Fractions Treated to Date: 4
Plan Prescribed Dose Per Fraction: 2.3 Gy
Plan Total Fractions Prescribed: 17
Plan Total Prescribed Dose: 39.1 Gy
Reference Point Dosage Given to Date: 9.2 Gy
Reference Point Session Dosage Given: 2.3 Gy
Session Number: 4

## 2022-12-19 LAB — BASIC METABOLIC PANEL
Anion gap: 8 (ref 5–15)
BUN: 45 mg/dL — ABNORMAL HIGH (ref 8–23)
CO2: 18 mmol/L — ABNORMAL LOW (ref 22–32)
Calcium: 8.7 mg/dL — ABNORMAL LOW (ref 8.9–10.3)
Chloride: 108 mmol/L (ref 98–111)
Creatinine, Ser: 1.39 mg/dL — ABNORMAL HIGH (ref 0.61–1.24)
GFR, Estimated: 52 mL/min — ABNORMAL LOW (ref >60)
Glucose, Bld: 169 mg/dL — ABNORMAL HIGH (ref 70–99)
Potassium: 4.4 mmol/L (ref 3.5–5.1)
Sodium: 134 mmol/L — ABNORMAL LOW (ref 135–145)

## 2022-12-19 LAB — CBC
HCT: 38.5 % — ABNORMAL LOW (ref 39.0–52.0)
Hemoglobin: 12.1 g/dL — ABNORMAL LOW (ref 13.0–17.0)
MCH: 29.8 pg (ref 26.0–34.0)
MCHC: 31.4 g/dL (ref 30.0–36.0)
MCV: 94.8 fL (ref 80.0–100.0)
Platelets: 294 10*3/uL (ref 150–400)
RBC: 4.06 MIL/uL — ABNORMAL LOW (ref 4.22–5.81)
RDW: 12.7 % (ref 11.5–15.5)
WBC: 18 10*3/uL — ABNORMAL HIGH (ref 4.0–10.5)
nRBC: 0 % (ref 0.0–0.2)

## 2022-12-19 LAB — MAGNESIUM: Magnesium: 2.1 mg/dL (ref 1.7–2.4)

## 2022-12-19 NOTE — TOC Progression Note (Signed)
Transition of Care [facility]) - Progression Note    Patient Details  Name: Roy [name] MRN: [id number] Date of Birth: 08/05/1945  Transition of [facility] CM/SW Contact  Beckie Busing, RN Phone Number:[phone]  12/19/2022, 11:22 AM  Clinical Narrative:    TOC continues to follow patient for SNF placement. Per MD noted patient will remain inpatient for radiation treatments. Transportation to radiation therapy is sometimes a barrier for SNF placement, but TOC would be willing to explore facility transportation if patient is placed in a facility. The question that [facility] would ask is medical necessity for remaining inpatient? CM will send message to MD for clarification.    Expected Discharge Plan: Skilled Nursing Facility Barriers to Discharge: Continued Medical Work up  Expected Discharge Plan and Services   Discharge Planning Services: CM Consult Post Acute Care Choice: Home Health Living arrangements for the past 2 months: Single Family Home                           HH Arranged: OT, PT HH Agency: [facility] Date [facility] Agency Contacted: 12/07/22 Time HH Agency Contacted: 1203 Representative spoke with at [facility] Agency: Kandee Keen   Social Determinants of Health (SDOH) Interventions SDOH Screenings   Food Insecurity: No Food Insecurity (12/06/2022)  Housing: Low Risk  (12/06/2022)  Transportation Needs: No Transportation Needs (12/06/2022)  Utilities: Not At Risk (12/06/2022)  Depression (PHQ2-9): Low Risk  (09/09/2020)  Tobacco Use: Medium Risk (12/05/2022)    Readmission Risk Interventions    04/01/2020   10:16 AM 03/30/2020   11:34 AM  Readmission Risk Prevention Plan  Transportation Screening  Complete  PCP or Specialist Appt within 3-5 Days Complete   HRI or Home Care Consult  Complete  Social Work Consult for Recovery Care Planning/Counseling  Complete  Palliative Care Screening  Not Applicable  Medication Review Oceanographer)  Complete

## 2022-12-19 NOTE — Progress Notes (Signed)
Physical Therapy Treatment Patient Details Name: Roy [name] MRN: [id number] DOB: April 01, 1945 Today's Date: 12/19/2022   History of Present Illness Pt is a 77 y/o admitted 12/05/22 for weakness and slipping out of chair at home unable to stand up.  Noted on AICD severe spike in PVC and pt with hypomagnesemia. 11/13 head CT with Rt frontal lobe mass. PMH - testicular CA s/p orchiecetomy, large B cell lymphoma, HTN, HLD, DM, CAD s/p CABG x 4 2022, CHF, cardiomyopathy s/p ICD, recurrent syncope.    PT Comments  Pt agreeable to therapy. Pt reports feeling well on today. He tolerated increased activity well. Still has L LE weakness/instability. Patient will benefit from continued inpatient follow up therapy, <3 hours/day     If plan is discharge home, recommend the following: Assistance with cooking/housework;Assist for transportation;Help with stairs or ramp for entrance;A little help with walking and/or transfers;A little help with bathing/dressing/bathroom   Can travel by private vehicle     Yes  Equipment Recommendations  None recommended by PT    Recommendations for Other Services       Precautions / Restrictions Precautions Precautions: Fall Restrictions Weight Bearing Restrictions: No     Mobility  Bed Mobility Overal bed mobility: Needs Assistance Bed Mobility: Supine to Sit     Supine to sit: Supervision, HOB elevated     General bed mobility comments: with increased time.    Transfers Overall transfer level: Needs assistance Equipment used: Rolling walker (2 wheels) Transfers: Sit to/from Stand Sit to Stand: Min assist           General transfer comment: CGA from elevated bed. Min A from toilet height. Cues for safety, hand placement.    Ambulation/Gait Ambulation/Gait assistance: Min assist Gait Distance (Feet): 125 Feet (125'x1; 75'x1) Assistive device: Rolling walker (2 wheels) Gait Pattern/deviations: Step-through pattern, Decreased dorsiflexion -  left       General Gait Details: L LE instability/weakness present-intermittent L knee "giving way". Intermittent cues for L foot clearance and RW proximity. Tolerated distance well.   Stairs             Wheelchair Mobility     Tilt Bed    Modified Rankin (Stroke Patients Only)       Balance Overall balance assessment: Needs assistance, History of Falls         Standing balance support: Reliant on assistive device for balance, Bilateral upper extremity supported, During functional activity Standing balance-Leahy Scale: Poor                              Cognition Arousal: Alert Behavior During Therapy: WFL for tasks assessed/performed Overall Cognitive Status: Within Functional Limits for tasks assessed                                          Exercises      General Comments        Pertinent Vitals/Pain Pain Assessment Pain Assessment: No/denies pain    Home Living                          Prior Function            PT Goals (current goals can now be found in the care plan section) Progress towards PT goals: Progressing toward goals  Frequency    Min 1X/week      PT Plan      Co-evaluation              AM-PAC PT "6 Clicks" Mobility   Outcome Measure  Help needed turning from your back to your side while in a flat bed without using bedrails?: None Help needed moving from lying on your back to sitting on the side of a flat bed without using bedrails?: A Little Help needed moving to and from a bed to a chair (including a wheelchair)?: A Little Help needed standing up from a chair using your arms (e.g., wheelchair or bedside chair)?: A Little Help needed to walk in hospital room?: A Little Help needed climbing 3-5 steps with a railing? : A Lot 6 Click Score: 18    End of Session Equipment Utilized During Treatment: Gait belt Activity Tolerance: Patient tolerated treatment well Patient  left: in chair;with chair alarm set;with call bell/phone within reach   PT Visit Diagnosis: Other abnormalities of gait and mobility (R26.89);History of falling (Z91.81);Muscle weakness (generalized) (M62.81)     Time: 0981-1914 PT Time Calculation (min) (ACUTE ONLY): 24 min  Charges:    $Gait Training: 23-37 mins PT General Charges $$ ACUTE PT VISIT: 1 Visit                         Faye Ramsay, PT Acute Rehabilitation  Office: [phone]

## 2022-12-19 NOTE — Progress Notes (Signed)
PROGRESS NOTE    Roy [name]  ZOX:[id number] DOB: Sep 21, 1945 DOA: 12/05/2022 PCP: Irena Reichmann, DO   Brief Narrative:  77 year old M with PMH of systolic CHF with ICD, CAD/CABG, remote testicular lymphoma s/p orchiectomy, CKD, DM-2, HTN and diminished hearing presented to ED with generalized weakness, falls, confusion and poor p.o. intake, and admitted for frequent PVCs, generalized weakness, falls and hypomagnesemia.  CT head showed right frontal mass.  MRI brain confirmed right frontal lobe mass suspicious for CNS lymphoma.  CT chest, abdomen and pelvis with no enlarged or progressive lymph nodes.  Oncology and radiation oncology consulted.  Patient was transferred to [facility] for further evaluation and care.   Underwent fluoroscopy guided lumbar puncture on 11/18.  CSF fluid with elevated glucose and protein.  CSF culture negative.  Cytology negative for malignant cells but cytometry with elevated CD19 and CD20.  Started radiation treatment on 11/21.  MRI spine pending.  Therapy recommended SNF once medically ready.  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  MRI brain showed 3.9 x 2.1 x 4.7 cm right frontal mass with additional smaller contrast-enhancing lesions in high right frontal lobe and near the posterior limb of internal capsule on the right.  CT chest, abdomen and pelvis with contrast without evidence of metastasis -Oncology and radiation oncology on board -S/p LP on 11/18. CSF with elevated glucose & protein. Culture negative. Cytology negative for malignant cells.  Cytometry with elevated CD19 and  CD20. -Started radiation treatment on 11/ 21, plan for 17 sessions, per wife oncology has told them that he will be staying in the hospital and completing all sessions before discharge.  Per oncology recommendation, he received Decadron 4 mg Q8h for 3 days>>Q12h starting 12/16/2022 for 20 days.  MRI spine completed, results still pending.   Chronic systolic CHF/cardiomyopathy with ICD: TTE 09/2022 with LVEF of 25 to 30%, GH, G1DD.  On Jardiance, Aldactone, Toprol-XL and losartan at home.  Appears euvolemic on exam.  Denies respiratory symptoms. -Continue home medications -Strict intake and output, daily weight, renal functions and electrolytes   CKD-3B: Cr better than baseline   NIDDM-2 with hyperglycemia: A1c 6.2%.  Slightly hyperglycemia partly due to steroid.  Increased Semglee to 20 units and continue NovoLog at 5 units 3 times daily with meals.   Acute metabolic encephalopathy: multifactorial.  Seems to have resolved.  Diffuse large B cell lymphoma- left testicular cancer s/p orchiectomy-now with right frontal mass concerning for relapse.  Oncology on board.   CAD/CABG: Stable -Continue home meds   Hypokalemia/hypomagnesemia -Monitor replenish as appropriate   Generalized weakness/frequent falls/fall in the hospital: Patient "slid down to the floor into sitting position" when he got up to go to bathroom the night of 11/20.  No fall with impact.  No injury.  CT head after fall without acute finding.  Evaluated by PT OT, SNF recommended at discharge.   Essential hypertension: Normotensive -Continue Toprol-XL, losartan, Aldactone   PVCs: No ventricular tachycardia. -Continue Toprol-XL -Cardiology recommended outpatient follow-up and signed off. -Optimize electrolytes  DVT prophylaxis: enoxaparin (LOVENOX) injection 40 mg Start: 12/06/22 1600  Code Status: Full Code  Family Communication:  wife present at bedside.  Plan of care discussed with patient in length and he/she verbalized  understanding and agreed with it.  Status is: Inpatient Remains inpatient appropriate because: Needs to remain in the hospital until completion of radiation therapy.   Estimated body mass index is 25.47 kg/m as calculated from the following:   Height as of this encounter: 6' (1.829 m).   Weight as of this encounter: 85.2 kg.    Nutritional Assessment: Body mass index is 25.47 kg/m.[name] Seen by dietician.  I agree with the assessment and plan as outlined below: Nutrition Status:        . Skin Assessment: I have examined the patient's skin and I agree with the wound assessment as performed by the wound care RN as outlined below:    Consultants:  Oncology and radiation therapy  Procedures:  As above  Antimicrobials:  Anti-infectives (From admission, onward)    None         Subjective: Seen and examined.  No family member at the bedside.  He has no complaints.  Objective: Vitals:   12/17/22 2118 12/18/22 0557 12/18/22 1950 12/19/22 0523  BP: 116/67 124/76 116/72 118/76  Pulse: 64 (!) 59 65 (!) 57  Resp: 20 20  20   Temp: 97.7 F (36.5 C) 98 F (36.7 C) 97.7 F (36.5 C) 98 F (36.7 C)  TempSrc: Oral  Oral   SpO2: 97% 99% 99% 97%  Weight:      Height:        Intake/Output Summary (Last 24 hours) at 12/19/2022 1052 Last data filed at 12/19/2022 0830 Gross per 24 hour  Intake 600 ml  Output 650 ml  Net -50 ml   Filed Weights   12/07/22 0417 12/09/22 0506 12/13/22 1100  Weight: 90.7 kg 89.6 kg 85.2 kg    Examination:  General exam: Appears calm and comfortable  Respiratory system: Clear to auscultation. Respiratory effort normal. Cardiovascular system: S1 & S2 heard, RRR. No JVD, murmurs, rubs, gallops or clicks. No pedal edema. Gastrointestinal system: Abdomen is nondistended, soft and nontender. No organomegaly or masses felt. Normal bowel sounds heard. Central nervous system: Alert and oriented. No focal neurological deficits. Extremities:  Symmetric 5 x 5 power. Skin: No rashes, lesions or ulcers.  Psychiatry: Judgement and insight appear normal. Mood & affect appropriate.   Data Reviewed: I have personally reviewed following labs and imaging studies  CBC: Recent Labs  Lab 12/14/22 0548 12/16/22 0610 12/18/22 0601 12/19/22 0508  WBC 15.9* 15.7* 16.4* 18.0*  HGB 11.5* 11.9* 11.7* 12.1*  HCT 36.8* 36.3* 36.9* 38.5*  MCV 94.8 92.6 93.4 94.8  PLT 286 277 280 294   Basic Metabolic Panel: Recent Labs  Lab 12/14/22 0548 12/16/22 0610 12/18/22 0601 12/19/22 0508  NA 137 137 131* 134*  K 4.7 4.4 4.5 4.4  CL 109 111 106 108  CO2 19* 16* 20* 18*  GLUCOSE 211* 164* 174* 169*  BUN 56* 41* 43* 45*  CREATININE 1.46* 1.14 1.48* 1.39*  CALCIUM 9.1 9.0 8.6* 8.7*  MG 2.0 1.9 2.1 2.1  PHOS 3.0 3.6 3.6  --    GFR: Estimated Creatinine Clearance: 47.3 mL/min (A) (by C-G formula based on SCr of 1.39 mg/dL (H)). Liver Function Tests: Recent Labs  Lab 12/14/22 0548 12/16/22 0610 12/18/22 0601  ALBUMIN 3.4* 3.1* 3.1*   No results for input(s): "LIPASE", "AMYLASE" in the last 168 hours. No results for input(s): "AMMONIA" in the  last 168 hours. Coagulation Profile: No results for input(s): "INR", "PROTIME" in the last 168 hours. Cardiac Enzymes: No results for input(s): "CKTOTAL", "CKMB", "CKMBINDEX", "TROPONINI" in the last 168 hours. BNP (last 3 results) Recent Labs    09/02/22 1000  PROBNP 609*   HbA1C: No results for input(s): "HGBA1C" in the last 72 hours. CBG: Recent Labs  Lab 12/18/22 0723 12/18/22 1107 12/18/22 1649 12/18/22 2135 12/19/22 0722  GLUCAP 161* 173* 140* 142* 146*   Lipid Profile: No results for input(s): "CHOL", "HDL", "LDLCALC", "TRIG", "CHOLHDL", "LDLDIRECT" in the last 72 hours. Thyroid Function Tests: No results for input(s): "TSH", "T4TOTAL", "FREET4", "T3FREE", "THYROIDAB" in the last 72 hours. Anemia Panel: No results for input(s): "VITAMINB12", "FOLATE", "FERRITIN", "TIBC",  "IRON", "RETICCTPCT" in the last 72 hours. Sepsis Labs: No results for input(s): "PROCALCITON", "LATICACIDVEN" in the last 168 hours.  Recent Results (from the past 240 hour(s))  CSF culture w Gram Stain     Status: None   Collection Time: 12/12/22  3:44 PM   Specimen: PATH Cytology CSF; Cerebrospinal Fluid  Result Value Ref Range Status   Specimen Description   Final    CSF Performed at [facility], [address] [place], [place] [address]    Special Requests   Final    NONE Performed at [facility], [address], [place], [place] [address]    Gram Stain   Final    NO WBC SEEN NO ORGANISMS SEEN CYTOSPIN SMEAR Performed at [facility], [address], [place], [place] [address]    Culture   Final    NO GROWTH 3 DAYS Performed at [facility], [address] [place], [place] [address]    Report Status 12/16/2022 FINAL  Final     Radiology Studies: No results found.  Scheduled Meds:  aspirin EC  81 mg Oral Daily   atorvastatin  40 mg Oral Daily   dexamethasone  4 mg Oral Q12H   enoxaparin (LOVENOX) injection  40 mg Subcutaneous Q24H   famotidine  40 mg Oral Daily   feeding supplement  237 mL Oral BID BM   insulin aspart  0-15 Units Subcutaneous TID WC   insulin aspart  0-5 Units Subcutaneous QHS   insulin aspart  5 Units Subcutaneous TID WC   insulin glargine-yfgn  20 Units Subcutaneous Daily   losartan  50 mg Oral Daily   metoprolol  200 mg Oral Daily   spironolactone  12.5 mg Oral Daily   Continuous Infusions:   LOS: 12 days   Hughie Closs, MD [facility]  12/19/2022, 10:52 AM   *Please note that this is a verbal dictation therefore any spelling or grammatical errors are due to the "[employer]agon [employer] One" system interpretation.  Please page via Amion and do not message via secure chat for urgent patient care matters. Secure chat can be used for non urgent patient care matters.  How  to contact the [facility] Attending or Consulting provider 7A - 7P or covering provider during after hours 7P -7A, for this patient?  Check the care team in [facility] and look for a) attending/consulting [facility] provider listed and b) the [facility] team listed. Page or secure chat 7A-7P. Log into [email or web address] and use [place]'s universal password to access. If you do not have the password, please contact the hospital operator. Locate the [facility] provider you are looking for under [facility] and page to a number that you can be directly  reached. If you still have difficulty reaching the provider, please page the [facility] (Director on Call) for the Hospitalists listed on amion for assistance.

## 2022-12-19 NOTE — Progress Notes (Signed)
Occupational Therapy Treatment Patient Details Name: Roy [name] MRN: [id number] DOB: 1945-07-05 Today's Date: 12/19/2022   History of present illness Pt is a 77 y/o admitted 12/05/22 for weakness and slipping out of chair at home unable to stand up.  Noted on AICD severe spike in PVC and pt with hypomagnesemia. 11/13 head CT with Rt frontal lobe mass. PMH - testicular CA s/p orchiecetomy, large B cell lymphoma, HTN, HLD, DM, CAD s/p CABG x 4 2022, CHF, cardiomyopathy s/p ICD, recurrent syncope.   OT comments  Patient was able to engage in functional activity tolerance tasks with patient able to self monitor for breaks with supervision. Patient was educated on need to plan out rest breaks rather than waiting for last second when needing to sit down. Patient verbalized understanding. Patient continues education on importance of pushing up from surface you are sitting on.Patient will benefit from continued inpatient follow up therapy, <3 hours/day.  Patient's discharge plan remains appropriate at this time. OT will continue to follow acutely.        If plan is discharge home, recommend the following:  A lot of help with walking and/or transfers;A lot of help with bathing/dressing/bathroom;Assist for transportation;Assistance with cooking/housework   Equipment Recommendations  None recommended by OT       Precautions / Restrictions Precautions Precautions: Fall Restrictions Weight Bearing Restrictions: No       Mobility Bed Mobility Overal bed mobility: Needs Assistance       Supine to sit: Supervision, HOB elevated     General bed mobility comments: with increased time.           ADL either performed or assessed with clinical judgement   ADL Overall ADL's : Needs assistance/impaired         Functional mobility during ADLs: Contact guard assist;Rolling walker (2 wheels) General ADL Comments: patient participated in functional mobility during session. patient wanted to  work on being on feet more during this session declining to work on ADLs. patients wife present in room during session. patient was able to engage in functional mobility from room to stairs.      Cognition Arousal: Alert Behavior During Therapy: WFL for tasks assessed/performed Overall Cognitive Status: Within Functional Limits for tasks assessed       General Comments: patient HOH but very cooperative. patients wife present in room as well.                   Pertinent Vitals/ Pain       Pain Assessment Pain Assessment: No/denies pain         Frequency  Min 1X/week        Progress Toward Goals  OT Goals(current goals can now be found in the care plan section)  Progress towards OT goals: Progressing toward goals     Plan         AM-PAC OT "6 Clicks" Daily Activity     Outcome Measure   Help from another person eating meals?: A Little Help from another person taking care of personal grooming?: A Little Help from another person toileting, which includes using toliet, bedpan, or urinal?: A Lot Help from another person bathing (including washing, rinsing, drying)?: A Lot Help from another person to put on and taking off regular upper body clothing?: A Little Help from another person to put on and taking off regular lower body clothing?: A Lot 6 Click Score: 15    End of Session Equipment Utilized During Treatment: Rolling  walker (2 wheels);Gait belt  OT Visit Diagnosis: Unsteadiness on feet (R26.81);Muscle weakness (generalized) (M62.81);History of falling (Z91.81)   Activity Tolerance Patient tolerated treatment well   Patient Left with call bell/phone within reach;in chair;with chair alarm set   Nurse Communication Mobility status        Time: 3086-5784 OT Time Calculation (min): 22 min  Charges: OT General Charges $OT Visit: 1 Visit OT Treatments $Therapeutic Activity: 8-22 mins  Rosalio Loud, MS Acute Rehabilitation Department Office#  [phone]   Selinda Flavin 12/19/2022, 12:49 PM

## 2022-12-20 ENCOUNTER — Other Ambulatory Visit: Payer: Self-pay

## 2022-12-20 ENCOUNTER — Ambulatory Visit
Admit: 2022-12-20 | Discharge: 2022-12-20 | Disposition: A | Discharge status: Home / Self Care | Payer: Medicare HMO | Attending: Radiation Oncology | Admitting: Radiation Oncology

## 2022-12-20 DIAGNOSIS — N1832 Chronic kidney disease, stage 3b: Secondary | ICD-10-CM | POA: Diagnosis not present

## 2022-12-20 DIAGNOSIS — G9389 Other specified disorders of brain: Secondary | ICD-10-CM | POA: Diagnosis not present

## 2022-12-20 DIAGNOSIS — I5022 Chronic systolic (congestive) heart failure: Secondary | ICD-10-CM | POA: Diagnosis not present

## 2022-12-20 LAB — RAD ONC ARIA SESSION SUMMARY
Course Elapsed Days: 6
Plan Fractions Treated to Date: 5
Plan Prescribed Dose Per Fraction: 2.3 Gy
Plan Total Fractions Prescribed: 17
Plan Total Prescribed Dose: 39.1 Gy
Reference Point Dosage Given to Date: 11.5 Gy
Reference Point Session Dosage Given: 2.3 Gy
Session Number: 5

## 2022-12-20 LAB — GLUCOSE, CAPILLARY
Glucose-Capillary: 121 mg/dL — ABNORMAL HIGH (ref 70–99)
Glucose-Capillary: 113 mg/dL — ABNORMAL HIGH (ref 70–99)
Glucose-Capillary: 141 mg/dL — ABNORMAL HIGH (ref 70–99)
Glucose-Capillary: 133 mg/dL — ABNORMAL HIGH (ref 70–99)

## 2022-12-20 NOTE — Progress Notes (Signed)
PROGRESS NOTE    Roy [name]  GNF:[id number] DOB: [birth year] DOA: 12/05/2022 PCP: Irena Reichmann, DO   Brief Narrative:  77 year old M with PMH of systolic CHF with ICD, CAD/CABG, remote testicular lymphoma s/p orchiectomy, CKD, DM-2, HTN and diminished hearing presented to ED with generalized weakness, falls, confusion and poor p.o. intake, and admitted for frequent PVCs, generalized weakness, falls and hypomagnesemia.  CT head showed right frontal mass.  MRI brain confirmed right frontal lobe mass suspicious for CNS lymphoma.  CT chest, abdomen and pelvis with no enlarged or progressive lymph nodes.  Oncology and radiation oncology consulted.  Patient was transferred to [facility] for further evaluation and care.   Underwent fluoroscopy guided lumbar puncture on 11/18.  CSF fluid with elevated glucose and protein.  CSF culture negative.  Cytology negative for malignant cells but cytometry with elevated CD19 and CD20.  Started radiation treatment on 11/21.  MRI spine pending.  Therapy recommended SNF once medically ready.  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  MRI brain showed 3.9 x 2.1 x 4.7 cm right frontal mass with additional smaller contrast-enhancing lesions in high right frontal lobe and near the posterior limb of internal capsule on the right.  CT chest, abdomen and pelvis with contrast without evidence of metastasis -Oncology and radiation oncology on board -S/p LP on 11/18. CSF with elevated glucose & protein. Culture negative. Cytology negative for malignant cells.  Cytometry with elevated CD19 and  CD20. -Started radiation treatment on 11/ 21, plan for 17 sessions, per wife oncology has told them that he will be staying in the hospital and completing all sessions before discharge.  Per oncology recommendation, he received Decadron 4 mg Q8h for 3 days>>Q12h starting 12/16/2022 for 20 days.  MRI spine completed, results still pending.   Chronic systolic CHF/cardiomyopathy with ICD: TTE 09/2022 with LVEF of 25 to 30%, GH, G1DD.  On Jardiance, Aldactone, Toprol-XL and losartan at home.  Appears euvolemic on exam.  Denies respiratory symptoms. -Continue home medications -Strict intake and output, daily weight, renal functions and electrolytes   CKD-3B: Cr better than baseline   NIDDM-2 with hyperglycemia: A1c 6.2%. Increased Semglee to 20 units and continue NovoLog at 5 units 3 times daily with meals.  Blood sugar now much better controlled.   Acute metabolic encephalopathy: Was multifactorial.  Patient has been alert and oriented for the last 3 days that I have seen him.  Diffuse large B cell lymphoma- left testicular cancer s/p orchiectomy-now with right frontal mass concerning for relapse.  Oncology on board.   CAD/CABG: Stable -Continue home meds   Hypokalemia/hypomagnesemia -Monitor replenish as appropriate   Generalized weakness/frequent falls/fall in the hospital: Patient "slid down to the floor into sitting position" when he got up to go to bathroom the night of 11/20.  No fall with impact.  No injury.  CT head after fall without acute finding.  Evaluated by PT OT, SNF recommended at discharge.   Essential hypertension: Normotensive -Continue Toprol-XL, losartan, Aldactone   PVCs: No ventricular tachycardia. -Continue Toprol-XL -Cardiology recommended outpatient follow-up and signed off. -Optimize electrolytes  DVT prophylaxis: enoxaparin (LOVENOX) injection 40 mg Start: 12/06/22 1600   Code Status: Full Code  Family Communication:  none present at bedside.  Plan of care  discussed with patient in length and he/she verbalized understanding and agreed with it.  Status is: Inpatient Remains inpatient appropriate because: Needs to remain in the hospital until completion of radiation therapy.  However TOC is looking to find a SNF which can provide daily transportation, if possible.   Estimated body mass index is 25.47 kg/m as calculated from the following:   Height as of this encounter: 6' (1.829 m).   Weight as of this encounter: 85.2 kg.    Nutritional Assessment: Body mass index is 25.47 kg/m.[name] Seen by dietician.  I agree with the assessment and plan as outlined below: Nutrition Status:        . Skin Assessment: I have examined the patient's skin and I agree with the wound assessment as performed by the wound care RN as outlined below:    Consultants:  Oncology and radiation therapy  Procedures:  As above  Antimicrobials:  Anti-infectives (From admission, onward)    None         Subjective: Seen and examined.  He has no complaints.  Objective: Vitals:   12/19/22 0523 12/19/22 1327 12/19/22 2114 12/20/22 0645  BP: 118/76 123/74 120/77 126/85  Pulse: (!) 57 62 63 68  Resp: 20 20 20 20   Temp: 98 F (36.7 C)  98.1 F (36.7 C) 97.8 F (36.6 C)  TempSrc:   Oral Oral  SpO2: 97% 100% 100% 99%  Weight:      Height:        Intake/Output Summary (Last 24 hours) at 12/20/2022 1123 Last data filed at 12/20/2022 0838 Gross per 24 hour  Intake 720 ml  Output 825 ml  Net -105 ml   Filed Weights   12/07/22 0417 12/09/22 0506 12/13/22 1100  Weight: 90.7 kg 89.6 kg 85.2 kg    Examination:  General exam: Appears calm and comfortable  Respiratory system: Clear to auscultation. Respiratory effort normal. Cardiovascular system: S1 & S2 heard, RRR. No JVD, murmurs, rubs, gallops or clicks. No pedal edema. Gastrointestinal system: Abdomen is nondistended, soft and nontender. No organomegaly or masses felt. Normal bowel sounds  heard. Central nervous system: Alert and oriented. No focal neurological deficits. Extremities: Symmetric 5 x 5 power. Skin: No rashes, lesions or ulcers.  Psychiatry: Judgement and insight appear normal. Mood & affect appropriate.   Data Reviewed: I have personally reviewed following labs and imaging studies  CBC: Recent Labs  Lab 12/14/22 0548 12/16/22 0610 12/18/22 0601 12/19/22 0508  WBC 15.9* 15.7* 16.4* 18.0*  HGB 11.5* 11.9* 11.7* 12.1*  HCT 36.8* 36.3* 36.9* 38.5*  MCV 94.8 92.6 93.4 94.8  PLT 286 277 280 294   Basic Metabolic Panel: Recent Labs  Lab 12/14/22 0548 12/16/22 0610 12/18/22 0601 12/19/22 0508  NA 137 137 131* 134*  K 4.7 4.4 4.5 4.4  CL 109 111 106 108  CO2 19* 16* 20* 18*  GLUCOSE 211* 164* 174* 169*  BUN 56* 41* 43* 45*  CREATININE 1.46* 1.14 1.48* 1.39*  CALCIUM 9.1 9.0 8.6* 8.7*  MG 2.0 1.9 2.1 2.1  PHOS 3.0 3.6 3.6  --    GFR: Estimated Creatinine Clearance: 47.3 mL/min (A) (by C-G formula based on SCr of 1.39 mg/dL (H)). Liver Function Tests: Recent Labs  Lab 12/14/22 0548 12/16/22 0610 12/18/22 0601  ALBUMIN 3.4* 3.1* 3.1*   No  results for input(s): "LIPASE", "AMYLASE" in the last 168 hours. No results for input(s): "AMMONIA" in the last 168 hours. Coagulation Profile: No results for input(s): "INR", "PROTIME" in the last 168 hours. Cardiac Enzymes: No results for input(s): "CKTOTAL", "CKMB", "CKMBINDEX", "TROPONINI" in the last 168 hours. BNP (last 3 results) Recent Labs    09/02/22 1000  PROBNP 609*   HbA1C: No results for input(s): "HGBA1C" in the last 72 hours. CBG: Recent Labs  Lab 12/19/22 0722 12/19/22 1123 12/19/22 1709 12/19/22 2117 12/20/22 0743  GLUCAP 146* 224* 129* 163* 121*   Lipid Profile: No results for input(s): "CHOL", "HDL", "LDLCALC", "TRIG", "CHOLHDL", "LDLDIRECT" in the last 72 hours. Thyroid Function Tests: No results for input(s): "TSH", "T4TOTAL", "FREET4", "T3FREE", "THYROIDAB" in the  last 72 hours. Anemia Panel: No results for input(s): "VITAMINB12", "FOLATE", "FERRITIN", "TIBC", "IRON", "RETICCTPCT" in the last 72 hours. Sepsis Labs: No results for input(s): "PROCALCITON", "LATICACIDVEN" in the last 168 hours.  Recent Results (from the past 240 hour(s))  CSF culture w Gram Stain     Status: None   Collection Time: 12/12/22  3:44 PM   Specimen: PATH Cytology CSF; Cerebrospinal Fluid  Result Value Ref Range Status   Specimen Description   Final    CSF Performed at [facility], [address] [place], [place] [address]    Special Requests   Final    NONE Performed at [facility], [address] [place], [place] [address]    Gram Stain   Final    NO WBC SEEN NO ORGANISMS SEEN CYTOSPIN SMEAR Performed at [facility], [address] [place], [place] [address]    Culture   Final    NO GROWTH 3 DAYS Performed at [facility], [address], [place], [place] [address]    Report Status 12/16/2022 FINAL  Final     Radiology Studies: No results found.  Scheduled Meds:  aspirin EC  81 mg Oral Daily   atorvastatin  40 mg Oral Daily   dexamethasone  4 mg Oral Q12H   enoxaparin (LOVENOX) injection  40 mg Subcutaneous Q24H   famotidine  40 mg Oral Daily   feeding supplement  237 mL Oral BID BM   insulin aspart  0-15 Units Subcutaneous TID WC   insulin aspart  0-5 Units Subcutaneous QHS   insulin aspart  5 Units Subcutaneous TID WC   insulin glargine-yfgn  20 Units Subcutaneous Daily   losartan  50 mg Oral Daily   metoprolol  200 mg Oral Daily   spironolactone  12.5 mg Oral Daily   Continuous Infusions:   LOS: 13 days   Hughie Closs, MD [facility]  12/20/2022, 11:23 AM   *Please note that this is a verbal dictation therefore any spelling or grammatical errors are due to the "Dragon Medical One" system interpretation.  Please page via Amion and do not message via secure chat  for urgent patient care matters. Secure chat can be used for non urgent patient care matters.  How to contact the [facility] Attending or Consulting provider 7A - 7P or covering provider during after hours 7P -7A, for this patient?  Check the care team in [facility] and look for a) attending/consulting TRH provider listed and b) the [facility] team listed. Page or secure chat 7A-7P. Log into [email or web address] and use [facility] universal password to access. If you do not have the password, please contact the hospital operator. Locate the [facility] provider  you are looking for under [facility] and page to a number that you can be directly reached. If you still have difficulty reaching the provider, please page the [facility] (Director on Call) for the Hospitalists listed on amion for assistance.

## 2022-12-20 NOTE — TOC Progression Note (Addendum)
Transition of Care [facility]) - Progression Note    Patient Details  Name: Roy [name] MRN: [id number] Date of Birth: 06-14-1945  Transition of [facility]) CM/SW Contact  Beckie Busing, RN Phone Number:[phone]  12/20/2022, 12:08 PM  Clinical Narrative:    CM followed up with [facility] admissions director at [facility] to determine if facility can accept patient and if they will be able to transport to daily radiation treatments. French Ana does not answer. Message has been left. Will await return call.   1540 Per French Ana with [facility] the facility is unable to make a bed offer while patient is receiving radiation therapy.     Expected Discharge Plan: Skilled Nursing Facility Barriers to Discharge: Continued Medical Work up  Expected Discharge Plan and Services   Discharge Planning Services: CM Consult Post Acute Care Choice: Home Health Living arrangements for the past 2 months: Single Family Home                           HH Arranged: OT, PT HH Agency: [facility] Date [facility] Agency Contacted: 12/07/22 Time HH Agency Contacted: 1203 Representative spoke with at [facility] Agency: Kandee Keen   Social Determinants of Health (SDOH) Interventions SDOH Screenings   Food Insecurity: No Food Insecurity (12/06/2022)  Housing: Low Risk  (12/06/2022)  Transportation Needs: No Transportation Needs (12/06/2022)  Utilities: Not At Risk (12/06/2022)  Depression (PHQ2-9): Low Risk  (09/09/2020)  Tobacco Use: Medium Risk (12/05/2022)    Readmission Risk Interventions    04/01/2020   10:16 AM 03/30/2020   11:34 AM  Readmission Risk Prevention Plan  Transportation Screening  Complete  PCP or Specialist Appt within 3-5 Days Complete   HRI or Home Care Consult  Complete  Social Work Consult for Recovery Care Planning/Counseling  Complete  Palliative Care Screening  Not Applicable  Medication Review Oceanographer)  Complete

## 2022-12-20 NOTE — Progress Notes (Signed)
OT Cancellation Note  Patient Details Name: Roy [name] MRN: [id number] DOB: [birth year]   Cancelled Treatment:    Reason Eval/Treat Not Completed: Fatigue/lethargy limiting ability to participate Patient sleeping reporting having worked already this AM. Mobility specialist to check on later this afternoon for walking as per patient request. Rosalio Loud, MS Acute Rehabilitation Department Office# [phone]  12/20/2022, 12:56 PM

## 2022-12-20 NOTE — Plan of Care (Signed)
  Problem: Education: Goal: Ability to describe self-care measures that may prevent or decrease complications (Diabetes Survival Skills Education) will improve Outcome: Progressing Goal: Individualized Educational Video(s) Outcome: Progressing   Problem: Coping: Goal: Ability to adjust to condition or change in health will improve Outcome: Progressing   Problem: Fluid Volume: Goal: Ability to maintain a balanced intake and output will improve Outcome: Progressing   Problem: Health Behavior/Discharge Planning: Goal: Ability to identify and utilize available resources and services will improve Outcome: Progressing   Problem: Nutritional: Goal: Maintenance of adequate nutrition will improve Outcome: Progressing   Problem: Skin Integrity: Goal: Risk for impaired skin integrity will decrease Outcome: Progressing   Problem: Clinical Measurements: Goal: Will remain free from infection Outcome: Progressing

## 2022-12-21 ENCOUNTER — Other Ambulatory Visit: Payer: Self-pay

## 2022-12-21 ENCOUNTER — Ambulatory Visit
Admission: RE | Admit: 2022-12-21 | Discharge: 2022-12-21 | Disposition: A | Discharge status: Home / Self Care | Payer: Medicare HMO | Source: Ambulatory Visit | Attending: Radiation Oncology | Admitting: Radiation Oncology

## 2022-12-21 DIAGNOSIS — Z510 Encounter for antineoplastic radiation therapy: Secondary | ICD-10-CM | POA: Insufficient documentation

## 2022-12-21 DIAGNOSIS — C8339 Primary central nervous system lymphoma: Secondary | ICD-10-CM | POA: Diagnosis not present

## 2022-12-21 DIAGNOSIS — C83398 Diffuse large b-cell lymphoma of other extranodal and solid organ sites: Secondary | ICD-10-CM | POA: Insufficient documentation

## 2022-12-21 LAB — RAD ONC ARIA SESSION SUMMARY
Course Elapsed Days: 7
Plan Fractions Treated to Date: 6
Plan Prescribed Dose Per Fraction: 2.3 Gy
Plan Total Fractions Prescribed: 17
Plan Total Prescribed Dose: 39.1 Gy
Reference Point Dosage Given to Date: 13.8 Gy
Reference Point Session Dosage Given: 2.3 Gy
Session Number: 6

## 2022-12-21 LAB — GLUCOSE, CAPILLARY
Glucose-Capillary: 121 mg/dL — ABNORMAL HIGH (ref 70–99)
Glucose-Capillary: 108 mg/dL — ABNORMAL HIGH (ref 70–99)
Glucose-Capillary: 150 mg/dL — ABNORMAL HIGH (ref 70–99)
Glucose-Capillary: 242 mg/dL — ABNORMAL HIGH (ref 70–99)

## 2022-12-21 NOTE — Progress Notes (Signed)
Physical Therapy Treatment Patient Details Name: Roy [name] MRN: [id number] DOB: 1945-11-30 Today's Date: 12/21/2022   History of Present Illness Pt is a 77 y/o admitted 12/05/22 for weakness and slipping out of chair at home unable to stand up.  Noted on AICD severe spike in PVC and pt with hypomagnesemia. 11/13 head CT with Rt frontal lobe mass. PMH - testicular CA s/p orchiecetomy, large B cell lymphoma, HTN, HLD, DM, CAD s/p CABG x 4 2022, CHF, cardiomyopathy s/p ICD, recurrent syncope.    PT Comments  Pt agreeable to working with therapy. Progressing with mobility. Will continue to follow.     If plan is discharge home, recommend the following: Assistance with cooking/housework;Assist for transportation;Help with stairs or ramp for entrance;A little help with walking and/or transfers;A little help with bathing/dressing/bathroom   Can travel by private vehicle     Yes  Equipment Recommendations  None recommended by PT    Recommendations for Other Services       Precautions / Restrictions Precautions Precautions: Fall Restrictions Weight Bearing Restrictions: No     Mobility  Bed Mobility Overal bed mobility: Needs Assistance Bed Mobility: Sit to Supine       Sit to supine: Supervision        Transfers Overall transfer level: Needs assistance Equipment used: Rolling walker (2 wheels) Transfers: Sit to/from Stand Sit to Stand: Supervision           General transfer comment: Cues for safety, hand placement.    Ambulation/Gait Ambulation/Gait assistance: Contact guard assist Gait Distance (Feet): 225 Feet Assistive device: Rolling walker (2 wheels) Gait Pattern/deviations: Step-through pattern, Decreased stride length       General Gait Details: L LE instability/weakness present. Intermittent cues for L foot clearance and RW proximity. Tolerated distance well.   Stairs             Wheelchair Mobility     Tilt Bed    Modified Rankin  (Stroke Patients Only)       Balance Overall balance assessment: Needs assistance, History of Falls         Standing balance support: Reliant on assistive device for balance, Bilateral upper extremity supported, During functional activity Standing balance-Leahy Scale: Poor                              Cognition Arousal: Alert Behavior During Therapy: WFL for tasks assessed/performed Overall Cognitive Status: Within Functional Limits for tasks assessed                                          Exercises      General Comments        Pertinent Vitals/Pain Pain Assessment Pain Assessment: No/denies pain    Home Living                          Prior Function            PT Goals (current goals can now be found in the care plan section) Progress towards PT goals: Progressing toward goals    Frequency    Min 1X/week      PT Plan      Co-evaluation              AM-PAC PT "6 Clicks" Mobility  Outcome Measure  Help needed turning from your back to your side while in a flat bed without using bedrails?: None Help needed moving from lying on your back to sitting on the side of a flat bed without using bedrails?: A Little Help needed moving to and from a bed to a chair (including a wheelchair)?: A Little Help needed standing up from a chair using your arms (e.g., wheelchair or bedside chair)?: A Little Help needed to walk in hospital room?: A Little Help needed climbing 3-5 steps with a railing? : A Lot 6 Click Score: 18    End of Session Equipment Utilized During Treatment: Gait belt Activity Tolerance: Patient tolerated treatment well Patient left: in bed;with call bell/phone within reach;with bed alarm set   PT Visit Diagnosis: Other abnormalities of gait and mobility (R26.89);History of falling (Z91.81);Muscle weakness (generalized) (M62.81)     Time: 2725-3664 PT Time Calculation (min) (ACUTE ONLY): 15  min  Charges:    $Gait Training: 8-22 mins PT General Charges $$ ACUTE PT VISIT: 1 Visit              Faye Ramsay, PT Acute Rehabilitation  Office: [phone]

## 2022-12-21 NOTE — Progress Notes (Addendum)
PROGRESS NOTE    Roy [name]  ZOX:[id number] DOB: 09-21-1945 DOA: 12/05/2022 PCP: Irena Reichmann, DO   Brief Narrative: 77 year old M with PMH of systolic CHF with ICD, CAD/CABG, remote testicular lymphoma s/p orchiectomy, CKD, DM-2, HTN and diminished hearing presented to ED with generalized weakness, falls, confusion and poor p.o. intake, and admitted for frequent PVCs, generalized weakness, falls and hypomagnesemia.  CT head showed right frontal mass.  MRI brain confirmed right frontal lobe mass suspicious for CNS lymphoma.  CT chest, abdomen and pelvis with no enlarged or progressive lymph nodes.  Oncology and radiation oncology consulted.  Patient was transferred to [facility] for further evaluation and care.   Underwent fluoroscopy guided lumbar puncture on 11/18.  CSF fluid with elevated glucose and protein.  CSF culture negative.  Cytology negative for malignant cells but cytometry with elevated CD19 and CD20.  Started radiation treatment on 11/21.  MRI spine pending.  Therapy recommended SNF once medically ready.  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   MRI brain showed 3.9 x 2.1 x 4.7 cm right frontal mass with additional smaller contrast-enhancing lesions in high right frontal lobe and near the posterior limb of internal capsule on the right.   CT chest, abdomen and pelvis with contrast without evidence of metastasis -Oncology and radiation oncology on board -S/p LP on 11/18. CSF with elevated glucose & protein. Culture negative. Cytology negative for malignant cells.  Cytometry with elevated CD19 and  CD20. -Started radiation treatment on 11/ 21, plan for 17 sessions, per wife oncology has told them that he will be staying in the hospital and completing all sessions before discharge.  Per oncology recommendation, he received Decadron 4 mg Q8h for 3 days>>Q12h starting 12/16/2022 for 20 days.  MRI spine completed, 11/21 -results still pending.MESSAGED ON CALL RADIOLOGIST.   Chronic systolic CHF/cardiomyopathy with ICD: TTE 09/2022 with LVEF of 25 to 30%, GH, G1DD.  On Jardiance, Aldactone, Toprol-XL and losartan at home.  Appears euvolemic on exam.  Denies respiratory symptoms. -Continue home medications -Strict intake and output, daily weight, renal functions and electrolytes   CKD-3B: Cr better than baseline   NIDDM-2 with hyperglycemia: A1c 6.2%. Increased Semglee to 20 units and continue NovoLog at 5 units 3 times daily with meals.  Blood sugar now much better controlled.   Acute metabolic encephalopathy: Was multifactorial.  Patient has been alert and oriented for the last 3 days that I have seen him.   Diffuse large B cell lymphoma- left testicular cancer s/p orchiectomy-now with right frontal mass concerning for relapse.  Oncology on board.   CAD/CABG: Stable -Continue home meds   Hypokalemia/hypomagnesemia -Monitor replenish as appropriate   Generalized weakness/frequent falls/fall in the hospital: Patient "slid down to the floor into sitting position" when he got up to go to bathroom the night of 11/20.  No fall with impact.  No injury.  CT head after fall without acute finding.  Evaluated by PT OT, SNF recommended at discharge.   Essential hypertension: Normotensive -Continue Toprol-XL, losartan, Aldactone   PVCs: No ventricular tachycardia. -Continue Toprol-XL -Cardiology recommended outpatient  follow-up and signed off. -Optimize electrolytes   Estimated body mass index is 25.47 kg/m as calculated from the following:   Height as of this encounter: 6' (1.829 m).   Weight  as of this encounter: 85.2 kg.  DVT prophylaxis: Lovenox Code Status: Full code Family Communication: None at bedside  disposition Plan:  Status is: Inpatient Remains inpatient appropriate because: Ongoing radiation   Consultants:  Oncology  Procedures: None  Antimicrobials: None  Subjective: He is resting in bed denies headaches shortness of breath chest pain nausea vomiting diarrhea  Objective: Vitals:   12/20/22 1340 12/20/22 2101 12/21/22 0553 12/21/22 1254  BP: (!) 127/51 114/80 130/78 133/66  Pulse: 75 65 93 75  Resp: 20 14 16 16   Temp: 97.9 F (36.6 C) 98.2 F (36.8 C) 98 F (36.7 C) (!) 97.3 F (36.3 C)  TempSrc: Oral Oral Oral Oral  SpO2: 100% 100% 94% 100%  Weight:      Height:        Intake/Output Summary (Last 24 hours) at 12/21/2022 1329 Last data filed at 12/21/2022 1000 Gross per 24 hour  Intake 360 ml  Output 1225 ml  Net -865 ml   Filed Weights   12/07/22 0417 12/09/22 0506 12/13/22 1100  Weight: 90.7 kg 89.6 kg 85.2 kg    Examination:  General exam: Appears in nad   Respiratory system: Clear to auscultation. Respiratory effort normal. Cardiovascular system: S1 & S2 heard, RRR. No JVD, murmurs, rubs, gallops or clicks. No pedal edema. Gastrointestinal system: Abdomen is nondistended, soft and nontender. No organomegaly or masses felt. Normal bowel sounds heard. Central nervous system: Alert and oriented. No focal neurological deficits. Extremities: Symmetric 5 x 5 power.  Data Reviewed: I have personally reviewed following labs and imaging studies  CBC: Recent Labs  Lab 12/16/22 0610 12/18/22 0601 12/19/22 0508  WBC 15.7* 16.4* 18.0*  HGB 11.9* 11.7* 12.1*  HCT 36.3* 36.9* 38.5*  MCV 92.6 93.4 94.8  PLT 277 280 294   Basic Metabolic Panel: Recent Labs  Lab 12/16/22 0610 12/18/22 0601 12/19/22 0508  NA 137 131* 134*  K 4.4 4.5 4.4  CL 111 106 108  CO2 16* 20* 18*  GLUCOSE 164* 174* 169*  BUN 41* 43* 45*  CREATININE 1.14  1.48* 1.39*  CALCIUM 9.0 8.6* 8.7*  MG 1.9 2.1 2.1  PHOS 3.6 3.6  --    GFR: Estimated Creatinine Clearance: 47.3 mL/min (A) (by C-G formula based on SCr of 1.39 mg/dL (H)). Liver Function Tests: Recent Labs  Lab 12/16/22 0610 12/18/22 0601  ALBUMIN 3.1* 3.1*   No results for input(s): "LIPASE", "AMYLASE" in the last 168 hours. No results for input(s): "AMMONIA" in the last 168 hours. Coagulation Profile: No results for input(s): "INR", "PROTIME" in the last 168 hours. Cardiac Enzymes: No results for input(s): "CKTOTAL", "CKMB", "CKMBINDEX", "TROPONINI" in the last 168 hours. BNP (last 3 results) Recent Labs    09/02/22 1000  PROBNP 609*   HbA1C: No results for input(s): "HGBA1C" in the last 72 hours. CBG: Recent Labs  Lab 12/20/22 1149 12/20/22 1651 12/20/22 2101 12/21/22 0739 12/21/22 1149  GLUCAP 113* 141* 133* 121* 108*   Lipid Profile: No results for input(s): "CHOL", "HDL", "LDLCALC", "TRIG", "CHOLHDL", "LDLDIRECT" in the last 72 hours. Thyroid Function Tests: No results for input(s): "TSH", "T4TOTAL", "FREET4", "T3FREE", "THYROIDAB" in the last 72 hours. Anemia Panel: No results for input(s): "VITAMINB12", "FOLATE", "FERRITIN", "TIBC", "IRON", "RETICCTPCT" in the last 72 hours. Sepsis Labs: No results  for input(s): "PROCALCITON", "LATICACIDVEN" in the last 168 hours.  Recent Results (from the past 240 hour(s))  CSF culture w Gram Stain     Status: None   Collection Time: 12/12/22  3:44 PM   Specimen: PATH Cytology CSF; Cerebrospinal Fluid  Result Value Ref Range Status   Specimen Description   Final    CSF Performed at [facility], [address], [place], [place] [address]    Special Requests   Final    NONE Performed at [facility], [address], [place], [place] [address]    Gram Stain   Final    NO WBC SEEN NO ORGANISMS SEEN CYTOSPIN SMEAR Performed at [facility], [address], [place], [place] [address]    Culture   Final    NO GROWTH 3 DAYS Performed at [facility], [address] [place], [place] [address]    Report Status 12/16/2022 FINAL  Final     Radiology Studies: No results found.  Scheduled Meds:  aspirin EC  81 mg Oral Daily   atorvastatin  40 mg Oral Daily   dexamethasone  4 mg Oral Q12H   enoxaparin (LOVENOX) injection  40 mg Subcutaneous Q24H   famotidine  40 mg Oral Daily   feeding supplement  237 mL Oral BID BM   insulin aspart  0-15 Units Subcutaneous TID WC   insulin aspart  0-5 Units Subcutaneous QHS   insulin aspart  5 Units Subcutaneous TID WC   insulin glargine-yfgn  20 Units Subcutaneous Daily   losartan  50 mg Oral Daily   metoprolol  200 mg Oral Daily   spironolactone  12.5 mg Oral Daily   Continuous Infusions:   LOS: 14 days    Time spent: 39 min Alwyn Ren, MD  12/21/2022, 1:29 PM

## 2022-12-21 NOTE — TOC Progression Note (Addendum)
Transition of Care [facility]) - Progression Note    Patient Details  Name: Roy [name] MRN: [id number] Date of Birth: 11-Apr-1945  Transition of Care [facility] CM/SW Contact  Beckie Busing, RN Phone [phone]  12/21/2022, 2:14 PM  Clinical Narrative:    Discharge barrier continue to be finding a SNF that will transport for daily radiation. Will continue to follow.    Expected Discharge Plan: Skilled Nursing Facility Barriers to Discharge: Continued Medical Work up  Expected Discharge Plan and Services   Discharge Planning Services: CM Consult Post Acute Care Choice: Home Health Living arrangements for the past 2 months: Single Family Home                           HH Arranged: OT, PT HH Agency: [facility] Date [facility] Agency Contacted: 12/07/22 Time HH Agency Contacted: 1203 Representative spoke with at [facility] Agency: Kandee Keen   Social Determinants of Health (SDOH) Interventions SDOH Screenings   Food Insecurity: No Food Insecurity (12/06/2022)  Housing: Low Risk  (12/06/2022)  Transportation Needs: No Transportation Needs (12/06/2022)  Utilities: Not At Risk (12/06/2022)  Depression (PHQ2-9): Low Risk  (09/09/2020)  Tobacco Use: Medium Risk (12/05/2022)    Readmission Risk Interventions    04/01/2020   10:16 AM 03/30/2020   11:34 AM  Readmission Risk Prevention Plan  Transportation Screening  Complete  PCP or Specialist Appt within 3-5 Days Complete   HRI or Home Care Consult  Complete  Social Work Consult for Recovery Care Planning/Counseling  Complete  Palliative Care Screening  Not Applicable  Medication Review [employer])  Complete

## 2022-12-21 NOTE — Plan of Care (Signed)
  Problem: Education: Goal: Ability to describe self-care measures that may prevent or decrease complications (Diabetes Survival Skills Education) will improve Outcome: Progressing   Problem: Nutritional: Goal: Maintenance of adequate nutrition will improve Outcome: Progressing   Problem: Skin Integrity: Goal: Risk for impaired skin integrity will decrease Outcome: Progressing   Problem: Nutrition: Goal: Adequate nutrition will be maintained Outcome: Progressing   Problem: Pain Management: Goal: General experience of comfort will improve Outcome: Progressing

## 2022-12-21 NOTE — Progress Notes (Signed)
Occupational Therapy Treatment Patient Details Name: Roy [name] MRN: [id number] DOB: 1945-01-26 Today's Date: 12/21/2022   History of present illness Pt is a 77 y/o admitted 12/05/22 for weakness and slipping out of chair at home unable to stand up.  Noted on AICD severe spike in PVC and pt with hypomagnesemia. 11/13 head CT with Rt frontal lobe mass. PMH - testicular CA s/p orchiecetomy, large B cell lymphoma, HTN, HLD, DM, CAD s/p CABG x 4 2022, CHF, cardiomyopathy s/p ICD, recurrent syncope.   OT comments  Pt progressing toward established OT goals. Focus session on activity tolerance, grooming, and transfers. Pt needing up to min A for transfers and balance throughout. Patient will benefit from continued inpatient follow up therapy, <3 hours/day       If plan is discharge home, recommend the following:  A lot of help with walking and/or transfers;A lot of help with bathing/dressing/bathroom;Assist for transportation;Assistance with cooking/housework   Equipment Recommendations  None recommended by OT    Recommendations for Other Services      Precautions / Restrictions Precautions Precautions: Fall Restrictions Weight Bearing Restrictions: No       Mobility Bed Mobility Overal bed mobility: Modified Independent             General bed mobility comments: increased time    Transfers Overall transfer level: Needs assistance Equipment used: Rolling walker (2 wheels) Transfers: Sit to/from Stand Sit to Stand: Min assist           General transfer comment: for rise.     Balance Overall balance assessment: Needs assistance, History of Falls         Standing balance support: Reliant on assistive device for balance, Bilateral upper extremity supported, During functional activity Standing balance-Leahy Scale: Poor                             ADL either performed or assessed with clinical judgement   ADL Overall ADL's : Needs  assistance/impaired     Grooming: Wash/dry hands;Minimal assistance;Standing                   Toilet Transfer: Minimal assistance;Rolling walker (2 wheels);Ambulation           Functional mobility during ADLs: Minimal assistance;Rolling walker (2 wheels)      Extremity/Trunk Assessment Upper Extremity Assessment Upper Extremity Assessment: Generalized weakness   Lower Extremity Assessment Lower Extremity Assessment: Defer to PT evaluation        Vision       Perception     Praxis      Cognition Arousal: Alert Behavior During Therapy: WFL for tasks assessed/performed Overall Cognitive Status: Within Functional Limits for tasks assessed                                 General Comments: hard of hearing, following all commands        Exercises Other Exercises Other Exercises: 5x STS with up to min A for rise    Shoulder Instructions       General Comments VSS    Pertinent Vitals/ Pain       Pain Assessment Pain Assessment: No/denies pain  Home Living  Prior Functioning/Environment              Frequency  Min 1X/week        Progress Toward Goals  OT Goals(current goals can now be found in the care plan section)  Progress towards OT goals: Progressing toward goals  Acute Rehab OT Goals Patient Stated Goal: get home OT Goal Formulation: With patient Time For Goal Achievement: 12/20/22 Potential to Achieve Goals: Fair ADL Goals Pt Will Perform Grooming: with supervision;standing Pt Will Perform Lower Body Dressing: sit to/from stand;with supervision Pt Will Transfer to Toilet: with contact guard assist;ambulating;regular height toilet Pt/caregiver will Perform Home Exercise Program: Increased strength;Both right and left upper extremity;With theraband;With written HEP provided  Plan      Co-evaluation                 AM-PAC OT "6 Clicks" Daily  Activity     Outcome Measure   Help from another person eating meals?: A Little Help from another person taking care of personal grooming?: A Little Help from another person toileting, which includes using toliet, bedpan, or urinal?: A Lot Help from another person bathing (including washing, rinsing, drying)?: A Lot Help from another person to put on and taking off regular upper body clothing?: A Little Help from another person to put on and taking off regular lower body clothing?: A Lot 6 Click Score: 15    End of Session Equipment Utilized During Treatment: Rolling walker (2 wheels);Gait belt  OT Visit Diagnosis: Unsteadiness on feet (R26.81);Muscle weakness (generalized) (M62.81);History of falling (Z91.81)   Activity Tolerance Patient tolerated treatment well   Patient Left in bed;with call bell/phone within reach;with bed alarm set   Nurse Communication Mobility status        Time: 7253-6644 OT Time Calculation (min): 13 min  Charges: OT General Charges $OT Visit: 1 Visit OT Treatments $Self Care/Home Management : 8-22 mins  [name], OTR/L [facility] Acute Rehabilitation Office: [phone]   [name] 12/21/2022, 5:17 PM

## 2022-12-21 NOTE — Progress Notes (Addendum)
Roy [name]   DOB:1945/08/15   WU#:[id number]    ASSESSMENT & PLAN:  Hx of Left-sided Primary Testicular Large B-cell Lymphoma.       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 S/p chemotherapy and IT MTX. -WBRT for right frontal lobe brain mass; continue follow w/Rad Onc  2. 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 Anemia -hemoglobin slightly low -likely multifactorial due to cancer thx + disease -no transfusional support required -continue to monitor CBC w/diff  Code Status Full code.  Goals of care: Continue WBR at this time, tolerating well.  Med Onc will follow as outpatient.  Discharge planning: Noted discharge to SNF when medically optimized.   All questions were answered. The patient knows to call the clinic with any problems, questions or concerns.   The total time spent in the appointment was 25 minutes encounter with patient including review of chart and various tests results, discussions about plan of care and coordination of care plan  Dawson Bills, NP 12/21/2022 1:47 PM  Subjective:  Pt seen awake, alert and oriented laying in bed.  Denies pain or headache. Denies new complaints. Pleasant. No acute distress noted.   Objective:  Vitals:   12/21/22 0553 12/21/22 1254  BP: 130/78 133/66  Pulse: 93 75  Resp: 16 16  Temp: 98 F (36.7 C) (!) 97.3 F (36.3 C)  SpO2: 94% 100%     Intake/Output Summary (Last 24 hours) at 12/21/2022 1347 Last data filed at 12/21/2022 1333 Gross per 24 hour  Intake 840 ml  Output 1425 ml  Net -585 ml    ROS: Pertinent items are noted in HPI.   PHYSICAL EXAM: Patient Vitals for the past 24 hrs:  BP Temp Temp src Pulse Resp SpO2  12/21/22 1254 133/66 (!) 97.3 F (36.3 C) Oral 75 16 100 %  12/21/22 0553 130/78 98 F (36.7 C) Oral 93 16 94 %  12/20/22 2101 114/80 98.2 F (36.8 C) Oral 65 14 100 %   General exam: No acute distress Head: Normocephalic,  without obvious abnormality, atraumatic Respiratory system: clear to auscultation. Respiratory effort normal. Gastrointestinal system: Abdomen nondistended, soft and nontender. Central Nervous System: Alert and oriented. Extremities: No edema. Skin: No rashes, lesions, ulcers.  ECOG PERFORMANCE STATUS: 2 - Symptomatic, <50% confined to bed [facility] Weights   12/07/22 0417 12/09/22 0506 12/13/22 1100  Weight: 199 lb 15.3 oz (90.7 kg) 197 lb 8.5 oz (89.6 kg) 187 lb 13.3 oz (85.2 kg)      Labs Reviewed:  Recent Labs    07/08/22 1009 09/02/22 1000 11/07/22 1238 12/05/22 2134 12/06/22 0506 12/14/22 0548 12/16/22 0610 12/18/22 0601 12/19/22 0508  NA 139   < > 143 141   < > 137 137 131* 134*  K 3.7   < > 3.7 3.7   < > 4.7 4.4 4.5 4.4  CL 109   < > 114* 113*   < > 109 111 106 108  CO2 18*   < > 21* 18*   < > 19* 16* 20* 18*  GLUCOSE 197*   < > 122* 110*   < > 211* 164* 174* 169*  BUN 28*   < > 23 13   < > 56* 41* 43* 45*  CREATININE 2.02*   < > 1.44* 1.31*   < > 1.46* 1.14 1.48* 1.39*  CALCIUM 9.0   < > 8.6* 8.2*   < > 9.1 9.0 8.6*  8.7*  GFRNONAA 33*  --  49* 55*   < > 49* >60 48* 52*  PROT 6.9  --  6.5 6.3*  --   --   --   --   --   ALBUMIN 4.2  --  4.1 3.4*   < > 3.4* 3.1* 3.1*  --   AST 15  --  14* 17  --   --   --   --   --   ALT 14  --  15 12  --   --   --   --   --   ALKPHOS 68  --  51 96  --   --   --   --   --   BILITOT 0.8  --  0.8 0.7  --   --   --   --   --    < > = values in this interval not displayed.    Studies Reviewed:  MR TOTAL SPINE METS SCREENING  Addendum Date: 12/21/2022   ADDENDUM REPORT: 12/21/2022 16:20 CONTRAST:  8 mL Gadavist. Electronically Signed   By: Marin Roberts M.D.   On: 12/21/2022 16:20   Result Date: 12/21/2022 CLINICAL DATA:  CNS lymphoma.  Right frontal lesion. EXAM: MRI TOTAL SPINE WITHOUT AND WITH CONTRAST TECHNIQUE: Multisequence MR imaging of the spine from the cervical spine to the sacrum was performed prior to and following  IV contrast administration for evaluation of spinal metastatic disease. COMPARISON:  CT of the chest, abdomen and pelvis without contrast 12/10/2022. MR head without and with contrast 12/08/2022. FINDINGS: MRI CERVICAL SPINE FINDINGS Alignment: No significant Vertebrae: Marrow signal and vertebral body heights are normal. Mild endplate degenerative changes are noted at C4-5, C5-6 and C6-7. No pathologic enhancement is present. Cord: Normal signal and morphology.  No pathologic enhancement. Posterior Fossa, vertebral arteries, paraspinal tissues: Craniocervical junction is normal. Flow is present in the vertebral arteries bilaterally. Visualized intracranial contents are normal. Disc levels: Degenerative disc disease contributes to some degree of central and foraminal stenosis at C4-5 and C5-6. MRI THORACIC SPINE FINDINGS Alignment: No significant listhesis is present. Normal thoracic kyphosis is present. Vertebrae: Marrow signal and vertebral body heights are normal. No pathologic enhancement is present. Cord: No pathologic enhancement normal signal and morphology. Is present. Paraspinal and other soft tissues: The visualized paraspinous soft tissues are within normal limits. Visualized lung fields and upper abdomen are normal. Disc levels: No significant thoracic disc disease or stenosis is present. MRI LUMBAR SPINE FINDINGS Segmentation: 5 non rib-bearing lumbar type vertebral bodies are present. The lowest fully formed vertebral body is L5. Alignment:  No significant listhesis is present. Vertebrae: Vertebral body heights and marrow signal are normal. No pathologic enhancement is present. Conus medullaris: Extends to the T12 level and appears normal. Paraspinal and other soft tissues: Limited imaging the abdomen is unremarkable. There is no significant adenopathy. No solid organ lesions are present. Disc levels: Desiccation of fluid signal in the L5-S1 disc is noted. A mild broad-based disc bulge is present  without significant stenosis. No other significant disc disease or stenosis is present in the lumbar spine. IMPRESSION: 1. No evidence for metastatic disease to the spine. 2. 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 3. Mild broad-based disc bulge at L5-S1 without significant stenosis. Electronically Signed: By: Marin Roberts M.D. On: 12/21/2022 15:35   DG FL GUIDED LUMBAR PUNCTURE  Result Date: 12/12/2022 CLINICAL  DATA:  CNS lymphoma.  New intracranial mass. EXAM: DIAGNOSTIC LUMBAR PUNCTURE UNDER FLUOROSCOPIC GUIDANCE FLUOROSCOPY: Radiation Exposure Index (as provided by the fluoroscopic device): 10.8 mGy PROCEDURE: Informed consent was obtained from the patient prior to the procedure, including potential complications of headache, allergy, brain herniation and pain. With the patient prone, the lower back was prepped with Betadine. 1% Lidocaine was used for local anesthesia. Lumbar puncture was performed at the L3-L4 level using a 20 gauge needle with return of clear CSF with an opening pressure of 15 cm water. Ten ml of CSF were obtained for laboratory studies. The patient tolerated the procedure well and there were no apparent complications. IMPRESSION: Successful lumbar puncture. Electronically Signed   By: Genevive Bi M.D.   On: 12/12/2022 16:53   CT HEAD WO CONTRAST ( )  Result Date: 12/12/2022 CLINICAL DATA:  Brain/CNS neoplasm, assess treatment response fall and evaluation for increased ICP prior to LP per IR recommendations. EXAM: CT HEAD WITHOUT CONTRAST TECHNIQUE: Contiguous axial images were obtained from the base of the skull through the vertex without intravenous contrast. RADIATION DOSE REDUCTION: This exam was performed according to the departmental dose-optimization program which includes automated exposure control, adjustment of the mA and/or kV according to patient size and/or use of iterative  reconstruction technique. COMPARISON:  Head CT 12/07/2022.  MRI brain 12/08/2022. FINDINGS: Brain: Unchanged hyperdense masses in the anteromedial right frontal lobe and posterior aspect of the right superior frontal gyrus with surrounding vasogenic edema, again suspicious for CNS involvement of lymphoma. Mild mass effect on the right-greater-than-left frontal horns of the lateral ventricles. No hydrocephalus or herniation. No extra-axial collection. Vascular: No hyperdense vessel or unexpected calcification. Skull: No calvarial fracture or suspicious bone lesion. Skull base is unremarkable. Sinuses/Orbits: No acute findings. Other: None. IMPRESSION: 1. Unchanged hyperdense masses in the anteromedial right frontal lobe and posterior aspect of the right superior frontal gyrus with surrounding vasogenic edema, again suspicious for CNS involvement of lymphoma. 2. No hydrocephalus or herniation. Electronically Signed   By: Orvan Falconer M.D.   On: 12/12/2022 12:03   CT CHEST ABDOMEN PELVIS WO CONTRAST  Result Date: 12/10/2022 CLINICAL DATA:  Inpatient, presents with concern for recurrent large B-cell lymphoma. History of left orchiectomy for same in February 2022. Contrast withheld due to renal insufficiency. EXAM: CT CHEST, ABDOMEN AND PELVIS WITHOUT CONTRAST TECHNIQUE: Multidetector CT imaging of the chest, abdomen and pelvis was performed following the standard protocol without IV contrast. RADIATION DOSE REDUCTION: This exam was performed according to the departmental dose-optimization program which includes automated exposure control, adjustment of the mA and/or kV according to patient size and/or use of iterative reconstruction technique. COMPARISON:  Portable chest 12/05/2022, PA Lat chest 09/02/2022, PET-CT studies 12/03/2020 and 05/11/2020, CTA chest 03/21/2020. FINDINGS: CT CHEST FINDINGS Cardiovascular: There is mild cardiomegaly again noted with a left chamber predominance. There are sternotomy  sutures and CABG changes. Native coronary arteries are heavily calcified. The pulmonary trunk measures prominent at 3.2 cm indicating arterial hypertension, unchanged. There is aortic and great vessel atherosclerosis, mild amount without evidence of aortic aneurysm, with descending segment tortuosity. Pulmonary veins are nondistended. A left chest battery has been inserted since the previous PET-CT, with single lead right ventricular wire placement. Mediastinum/Nodes: Subcentimeter right paratracheal and subcarinal mediastinal lymph nodes are unchanged in the interval. No new or progressive adenopathy is seen without contrast. Thyroid is mostly obscured by metal artifact from the pacemaker/AID but normal where visible. Moderate-sized hiatal hernia is increased in size since 2022.  The thoracic trachea, main bronchi, thoracic esophagus are unremarkable. Lungs/Pleura: There are new bilateral minimal layering pleural effusions, slightly greater fluid on the left. No pneumothorax. There is mild subpleural reticulation in the lung bases, but no honeycombing. There is mild posterior atelectasis in the lower lobes. No focal pneumonia is evident. No pulmonary nodules are seen. Musculoskeletal: Osteopenia and thoracic kyphosis and degenerative change. No acute or destructive osseous findings. There are subacute healing fractures of the left lateral fifth, sixth, and seventh ribs and posterolateral left eighth and ninth ribs. 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 CT ABDOMEN PELVIS FINDINGS Hepatobiliary: There are a few small layering stones in the gallbladder but no wall thickening or biliary dilatation. The liver again demonstrating small scattered cysts, but without contrast no suspicious lesion is seen. Pancreas: There is mild pancreatic atrophy. No focal lesion is seen without contrast. Spleen: Unremarkable without contrast. Adrenals/Urinary Tract: There is no adrenal mass. There is no contour  deforming mass of either unenhanced kidney. There is no urinary stone or obstruction. Bilateral perinephric fat stranding unchanged. The bladder is thickened but also not fully distended. Correlate clinically for cystitis or hypertrophy versus nondistention. Unchanged. Stomach/Bowel: Hiatal hernia. No dilatation or wall thickening including the appendix. Sigmoid diverticulosis without evidence of diverticulitis. Rectosigmoid patent surgical anastomosis is again shown. Vascular/Lymphatic: No abdominal, pelvic or inguinal adenopathy. Prior left orchiectomy. Moderate aortoiliac atherosclerosis. No AAA. Reproductive: Enlarged prostate, 5.2 cm transverse. Dystrophic calcifications left lobe chronically. Other: No abdominal wall hernia or abnormality. No abdominopelvic ascites. Musculoskeletal: Degenerative changes lumbar spine mild osteopenia. No acute or significant osseous findings. IMPRESSION: 1. No evidence of enlarged or progressive lymph nodes in the chest, abdomen or pelvis without contrast. 2. New minimal bilateral pleural effusions with mild posterior atelectasis. 3. Cardiomegaly with CABG changes. 4. Prominent pulmonary trunk indicating arterial hypertension, unchanged. 5. Moderate-sized hiatal hernia increased in size since 2022. 6. Cholelithiasis. 7. Diverticulosis without evidence of diverticulitis. 8. Prostatomegaly. 9. Subacute healing fractures of the left lateral fifth, sixth, and seventh ribs and posterolateral left eighth and ninth ribs. 10. Osteopenia and degenerative change. 11. Aortic atherosclerosis. Aortic Atherosclerosis (ICD10-I70.0). Electronically Signed   By: Almira Bar M.D.   On: 12/10/2022 02:53   MR BRAIN W WO CONTRAST  Result Date: 12/08/2022 CLINICAL DATA:  Brain/CNS neoplasm, monitor follow up right frontal mass EXAM: MRI HEAD WITHOUT AND WITH CONTRAST TECHNIQUE: Multiplanar, multiecho pulse sequences of the brain and surrounding structures were obtained without and with  intravenous contrast. CONTRAST:  9mL GADAVIST GADOBUTROL 1 MMOL/ML IV SOLN COMPARISON:  Same day CT head FINDINGS: Brain: Negative for an acute infarct. No hemorrhage. No hydrocephalus. No extra-axial fluid collection. No hydrocephalus. There is a background of mild chronic microvascular ischemic change. There is a dominant contrast-enhancing mass right frontal lobe measuring 3.9 x 2.1 x 4.7 Cm (series 7, image 35). There is an additional smaller contrast-enhancing mass near the posterior limb of the internal capsule on the right measuring 9 x 8 mm (series 11, image 31). There has an additional separate contrast-enhancing lesion in the high right frontal lobe measuring 1.4 x 0.8 cm (series 11, image 48). There is T2/FLAIR hyperintense signal surrounding this lesion. There is evidence of mild diffusion restriction associated with lesions. There is mass effect on the right lateral ventricular system Vascular: Normal flow voids. Skull and upper cervical spine: Normal marrow signal. Sinuses/Orbits: Small right mastoid effusion. No middle ear effusion. Paranasal sinuses are notable for mucosal thickening in the bilateral  ethmoid sinuses. Orbits are unremarkable. Other: None. IMPRESSION: Dominant contrast-enhancing mass in the right frontal lobe measuring 3.9 x 2.1 x 4.7 cm with additional smaller contrast-enhancing lesions in the high right frontal lobe and near the posterior limb of the internal capsule on the right. 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 Electronically Signed   By: Lorenza Cambridge M.D.   On: 12/08/2022 14:30   CT HEAD WO CONTRAST ( )  Result Date: 12/08/2022 CLINICAL DATA:  Neuro deficit with acute stroke suspected. Weakness. EXAM: CT HEAD WITHOUT CONTRAST TECHNIQUE: Contiguous axial images were obtained from the base of the skull through the vertex without intravenous contrast. RADIATION DOSE REDUCTION: This exam was performed according to the departmental dose-optimization program which  includes automated exposure control, adjustment of the mA and/or kV according to patient size and/or use of iterative reconstruction technique. COMPARISON:  01/14/2021 brain MRI FINDINGS: Brain: Indistinct masslike high-density in the anterior right frontal region with adjacent vasogenic type pattern of white matter low-density and expansion. There is local mass effect. No meningioma or other mass seen in this region on prior, concerning for aggressive an intra-axial mass, possibly related to the patient's history of lymphoma. No indication of acute infarct, hemorrhage, hydrocephalus, or shift. Vascular: No hyperdense vessel or unexpected calcification. Skull: Normal. Negative for fracture or focal lesion. Sinuses/Orbits: No acute finding. IMPRESSION: Indistinct masslike area in the right frontal lobe with regional swelling and mass effect. Recommend brain MRI with contrast. Electronically Signed   By: Tiburcio Pea M.D.   On: 12/08/2022 06:27   DG Chest Port 1 View  Result Date: 12/05/2022 CLINICAL DATA:  Shortness of breath EXAM: PORTABLE CHEST 1 VIEW COMPARISON:  Chest x-ray 09/02/2022 FINDINGS: Sternotomy wires, mediastinal clips and left-sided pacemaker are again seen. The heart size and mediastinal contours are within normal limits. Both lungs are clear. The visualized skeletal structures are unremarkable. IMPRESSION: No active disease. Electronically Signed   By: Darliss Cheney M.D.   On: 12/05/2022 23:55   CUP PACEART REMOTE DEVICE CHECK  Result Date: 12/01/2022 Scheduled remote reviewed. Normal device function.  Recent increase in PVC's per trends, 918/hr over last 24hrs Thoracic impedance trending down Next remote 91 days. [place], CVRS    ADDENDUM  .Patient was Personally and independently interviewed, examined and relevant elements of the history of present illness were reviewed in details and an assessment and plan was created. All elements of the patient's history of present illness ,  assessment and plan were discussed in details with Dawson Bills, NP. The above documentation reflects our combined findings assessment and plan.    MRI Total spine noted- 1. No evidence for metastatic disease to the spine. 2. 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 3. Mild broad-based disc bulge at L5-S1 without significant stenosis.   Wyvonnia Lora MD MS

## 2022-12-22 DIAGNOSIS — R7989 Other specified abnormal findings of blood chemistry: Secondary | ICD-10-CM | POA: Diagnosis not present

## 2022-12-22 DIAGNOSIS — C83398 Diffuse large b-cell lymphoma of other extranodal and solid organ sites: Secondary | ICD-10-CM | POA: Diagnosis not present

## 2022-12-22 LAB — GLUCOSE, CAPILLARY
Glucose-Capillary: 124 mg/dL — ABNORMAL HIGH (ref 70–99)
Glucose-Capillary: 220 mg/dL — ABNORMAL HIGH (ref 70–99)
Glucose-Capillary: 178 mg/dL — ABNORMAL HIGH (ref 70–99)
Glucose-Capillary: 170 mg/dL — ABNORMAL HIGH (ref 70–99)

## 2022-12-22 MED ORDER — PANTOPRAZOLE SODIUM 40 MG PO TBEC
40.0000 mg | DELAYED_RELEASE_TABLET | Freq: Every day | ORAL | Status: DC
  Administered 2022-12-22 – 2022-12-27 (×6): 40 mg via ORAL
  Filled 2022-12-22 (×6): qty 1

## 2022-12-22 NOTE — Progress Notes (Signed)
PROGRESS NOTE    Roy [name]  BJS:[id number] DOB: 1945-02-25 DOA: 12/05/2022 PCP: Irena Reichmann, DO   Brief Narrative: 77 year old M with PMH of systolic CHF with ICD, CAD/CABG, remote testicular lymphoma s/p orchiectomy, CKD, DM-2, HTN and diminished hearing presented to ED with generalized weakness, falls, confusion and poor p.o. intake, and admitted for frequent PVCs, generalized weakness, falls and hypomagnesemia.  CT head showed right frontal mass.  MRI brain confirmed right frontal lobe mass suspicious for CNS lymphoma.  CT chest, abdomen and pelvis with no enlarged or progressive lymph nodes.  Oncology and radiation oncology consulted.  Patient was transferred to [facility] for further evaluation and care.   Underwent fluoroscopy guided lumbar puncture on 11/18.  CSF fluid with elevated glucose and protein.  CSF culture negative.  Cytology negative for malignant cells but cytometry with elevated CD19 and CD20.  Started radiation treatment on 11/21.  MRI spine pending.  Therapy recommended SNF once medically ready.  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   MRI brain showed 3.9 x 2.1 x 4.7 cm right frontal mass with additional smaller contrast-enhancing lesions in high right frontal lobe and near the posterior limb of internal capsule on the right.   CT chest, abdomen and pelvis with contrast without evidence of metastasis -Oncology and radiation oncology on board -S/p LP on 11/18. CSF with elevated glucose & protein. Culture negative. Cytology negative for malignant cells.  Cytometry with  elevated CD19 and CD20. -Started radiation treatment on 11/ 21, plan for 17 sessions, per wife oncology has told them that he will be staying in the hospital and completing all sessions before discharge.  Per oncology recommendation, he received Decadron 4 mg Q8h for 3 days>>Q12h starting 12/16/2022 for 20 days.  MRI spine total  completed, 11/21 -no mets   Chronic systolic CHF/cardiomyopathy with ICD: TTE 09/2022 with LVEF of 25 to 30%, GH, G1DD.  On Jardiance, Aldactone, Toprol-XL and losartan at home.  Appears euvolemic on exam.  Denies respiratory symptoms. -Continue home medications -Strict intake and output, daily weight, renal functions and electrolytes   CKD-3B: Cr better than baseline   NIDDM-2 with hyperglycemia: A1c 6.2%. Increased Semglee to 20 units and continue NovoLog at 5 units 3 times daily with meals.  Blood sugar now much better controlled.   Acute metabolic encephalopathy: Was multifactorial.  Patient has been alert and oriented for the last 3 days that I have seen him.   Diffuse large B cell lymphoma- left testicular cancer s/p orchiectomy-now with right frontal mass concerning for relapse.  Oncology on board.   CAD/CABG: Stable -Continue home meds   Hypokalemia/hypomagnesemia -Monitor replenish as appropriate   Generalized weakness/frequent falls/fall in the hospital: Patient "slid down to the floor into sitting position" when he got up to go to bathroom the night of 11/20.  No fall with impact.  No injury.  CT head after fall without acute finding.  Evaluated by PT OT, SNF recommended at discharge.   Essential hypertension: Normotensive -Continue Toprol-XL, losartan, Aldactone   PVCs: No ventricular tachycardia. -Continue Toprol-XL -Cardiology recommended  outpatient follow-up and signed off. -Optimize electrolytes   Estimated body mass index is 25.47 kg/m as calculated from the following:   Height as of this encounter: 6' (1.829 m).   Weight as of this  encounter: 85.2 kg.  DVT prophylaxis: Lovenox Code Status: Full code Family Communication: None at bedside  disposition Plan:  Status is: Inpatient Remains inpatient appropriate because: Ongoing radiation   Consultants:  Oncology  Procedures: None  Antimicrobials: None  Subjective:  No new c/o Lives far from hospital does not think his wife can drive him back and forth for radiation next radiation is on 12/26/2022  Objective: Vitals:   12/21/22 1254 12/21/22 2013 12/22/22 0502 12/22/22 1327  BP: 133/66 (!) 140/77 113/86 104/60  Pulse: 75 83 76 82  Resp: 16 16 16 18   Temp: (!) 97.3 F (36.3 C) 97.7 F (36.5 C) 97.7 F (36.5 C) 98.1 F (36.7 C)  TempSrc: Oral Oral Oral Oral  SpO2: 100% 100% 99% 98%  Weight:      Height:        Intake/Output Summary (Last 24 hours) at 12/22/2022 1335 Last data filed at 12/22/2022 1300 Gross per 24 hour  Intake 720 ml  Output 400 ml  Net 320 ml   Filed Weights   12/07/22 0417 12/09/22 0506 12/13/22 1100  Weight: 90.7 kg 89.6 kg 85.2 kg    Examination:  General exam: Appears in nad   Respiratory system: Clear to auscultation. Respiratory effort normal. Cardiovascular system: S1 & S2 heard, RRR. No JVD, murmurs, rubs, gallops or clicks. No pedal edema. Gastrointestinal system: Abdomen is nondistended, soft and nontender. No organomegaly or masses felt. Normal bowel sounds heard. Central nervous system: Alert and oriented. No focal neurological deficits. Extremities: Symmetric 5 x 5 power.  Data Reviewed: I have personally reviewed following labs and imaging studies  CBC: Recent Labs  Lab 12/16/22 0610 12/18/22 0601 12/19/22 0508  WBC 15.7* 16.4* 18.0*  HGB 11.9* 11.7* 12.1*  HCT 36.3* 36.9* 38.5*  MCV 92.6 93.4 94.8  PLT 277 280 294   Basic Metabolic Panel: Recent Labs  Lab 12/16/22 0610 12/18/22 0601 12/19/22 0508  NA 137 131* 134*  K 4.4 4.5 4.4  CL 111 106 108  CO2 16* 20* 18*  GLUCOSE 164* 174* 169*  BUN  41* 43* 45*  CREATININE 1.14 1.48* 1.39*  CALCIUM 9.0 8.6* 8.7*  MG 1.9 2.1 2.1  PHOS 3.6 3.6  --    GFR: Estimated Creatinine Clearance: 47.3 mL/min (A) (by C-G formula based on SCr of 1.39 mg/dL (H)). Liver Function Tests: Recent Labs  Lab 12/16/22 0610 12/18/22 0601  ALBUMIN 3.1* 3.1*   No results for input(s): "LIPASE", "AMYLASE" in the last 168 hours. No results for input(s): "AMMONIA" in the last 168 hours. Coagulation Profile: No results for input(s): "INR", "PROTIME" in the last 168 hours. Cardiac Enzymes: No results for input(s): "CKTOTAL", "CKMB", "CKMBINDEX", "TROPONINI" in the last 168 hours. BNP (last 3 results) Recent Labs    09/02/22 1000  PROBNP 609*   HbA1C: No results for input(s): "HGBA1C" in the last 72 hours. CBG: Recent Labs  Lab 12/21/22 1149 12/21/22 1616 12/21/22 2014 12/22/22 0743 12/22/22 1139  GLUCAP 108* 150* 242* 124* 220*   Lipid Profile: No results for input(s): "CHOL", "HDL", "LDLCALC", "TRIG", "CHOLHDL", "LDLDIRECT" in the last 72 hours. Thyroid Function Tests: No results for input(s): "TSH", "T4TOTAL", "FREET4", "T3FREE", "THYROIDAB" in the last 72 hours. Anemia Panel: No results for input(s): "VITAMINB12", "FOLATE", "FERRITIN", "  TIBC", "IRON", "RETICCTPCT" in the last 72 hours. Sepsis Labs: No results for input(s): "PROCALCITON", "LATICACIDVEN" in the last 168 hours.  Recent Results (from the past 240 hour(s))  CSF culture w Gram Stain     Status: None   Collection Time: 12/12/22  3:44 PM   Specimen: PATH Cytology CSF; Cerebrospinal Fluid  Result Value Ref Range Status   Specimen Description   Final    CSF Performed at [facility], [address] [place], [place] [address]    Special Requests   Final    NONE Performed at [facility], [address] [place], [place] [address]    Gram Stain   Final    NO WBC SEEN NO ORGANISMS SEEN CYTOSPIN SMEAR Performed at [facility], [address], [place], [place] [address]    Culture   Final    NO GROWTH 3 DAYS Performed at [facility], [address] [place], [place] [address]    Report Status 12/16/2022 FINAL  Final     Radiology Studies: No results found.  Scheduled Meds:  aspirin EC  81 mg Oral Daily   atorvastatin  40 mg Oral Daily   dexamethasone  4 mg Oral Q12H   enoxaparin (LOVENOX) injection  40 mg Subcutaneous Q24H   famotidine  40 mg Oral Daily   feeding supplement  237 mL Oral BID BM   insulin aspart  0-15 Units Subcutaneous TID WC   insulin aspart  0-5 Units Subcutaneous QHS   insulin aspart  5 Units Subcutaneous TID WC   insulin glargine-yfgn  20 Units Subcutaneous Daily   losartan  50 mg Oral Daily   metoprolol  200 mg Oral Daily   pantoprazole  40 mg Oral Daily   spironolactone  12.5 mg Oral Daily   Continuous Infusions:   LOS: 15 days    Time spent: 39 min Alwyn Ren, MD  12/22/2022, 1:35 PM

## 2022-12-22 NOTE — Plan of Care (Signed)
  Problem: Nutritional: Goal: Maintenance of adequate nutrition will improve Outcome: Progressing Goal: Progress toward achieving an optimal weight will improve Outcome: Progressing   Problem: Skin Integrity: Goal: Risk for impaired skin integrity will decrease Outcome: Progressing   Problem: Tissue Perfusion: Goal: Adequacy of tissue perfusion will improve Outcome: Progressing   Problem: Activity: Goal: Risk for activity intolerance will decrease Outcome: Progressing   Problem: Safety: Goal: Ability to remain free from injury will improve Outcome: Progressing   Problem: Skin Integrity: Goal: Risk for impaired skin integrity will decrease Outcome: Progressing

## 2022-12-23 DIAGNOSIS — C83398 Diffuse large b-cell lymphoma of other extranodal and solid organ sites: Secondary | ICD-10-CM | POA: Diagnosis not present

## 2022-12-23 LAB — GLUCOSE, CAPILLARY
Glucose-Capillary: 156 mg/dL — ABNORMAL HIGH (ref 70–99)
Glucose-Capillary: 179 mg/dL — ABNORMAL HIGH (ref 70–99)
Glucose-Capillary: 144 mg/dL — ABNORMAL HIGH (ref 70–99)
Glucose-Capillary: 163 mg/dL — ABNORMAL HIGH (ref 70–99)

## 2022-12-23 MED ORDER — METOPROLOL SUCCINATE ER 50 MG PO TB24
50.0000 mg | ORAL_TABLET | Freq: Every day | ORAL | Status: DC
  Administered 2022-12-24 – 2022-12-27 (×4): 50 mg via ORAL
  Filled 2022-12-23 (×4): qty 1

## 2022-12-23 NOTE — Progress Notes (Signed)
Physical Therapy Treatment Patient Details Name: Roy [name] MRN: [id number] DOB: 01-09-1946 Today's Date: 12/23/2022   History of Present Illness Pt is a 77 y/o admitted 12/05/22 for weakness and slipping out of chair at home unable to stand up.  Noted on AICD severe spike in PVC and pt with hypomagnesemia. 11/13 head CT with Rt frontal lobe mass (concern for CNS Relapse) currently WBRT for right frontal lobe brain mass; continue follow w/Rad Onc.  PMH - testicular CA s/p orchiecetomy, large B cell lymphoma, HTN, HLD, DM, CAD s/p CABG x 4 2022, CHF, cardiomyopathy s/p ICD, recurrent syncope.    PT Comments  Pt assisted to bathroom first per his request and able to use without assist.  Pt then ambulated good distance in hallway.  Pt currently CGA to supervision assist with mobility.  Spouse present and reports d/c plan for rehab however she was wondering about home.  Due to pt's progress and if spouse feels she can assist pt at home, updated d/c recommendations for HHPT (also notified TOC team).    If plan is discharge home, recommend the following: Assistance with cooking/housework;Assist for transportation;Help with stairs or ramp for entrance;A little help with walking and/or transfers;A little help with bathing/dressing/bathroom   Can travel by private vehicle        Equipment Recommendations  Rolling walker (2 wheels)    Recommendations for Other Services       Precautions / Restrictions Precautions Precautions: Fall Restrictions Weight Bearing Restrictions: No     Mobility  Bed Mobility Overal bed mobility: Modified Independent       Supine to sit: HOB elevated, Used rails          Transfers Overall transfer level: Needs assistance Equipment used: Rolling walker (2 wheels) Transfers: Sit to/from Stand Sit to Stand: Contact guard assist, Supervision           General transfer comment: CGA from bed, performed rise from toilet supervision     Ambulation/Gait Ambulation/Gait assistance: Contact guard assist, Supervision Gait Distance (Feet): 360 Feet Assistive device: Rolling walker (2 wheels) Gait Pattern/deviations: Step-through pattern, Decreased stride length, Decreased dorsiflexion - left Gait velocity: decreased     General Gait Details: cues for heel strike on L, pt reports Left LE remains weaker but improved, no buckling or unsteadiness observed, one standing rest break   Stairs             Wheelchair Mobility     Tilt Bed    Modified Rankin (Stroke Patients Only)       Balance Overall balance assessment: Needs assistance, History of Falls         Standing balance support: Bilateral upper extremity supported, During functional activity, Reliant on assistive device for balance Standing balance-Leahy Scale: Poor                              Cognition Arousal: Alert Behavior During Therapy: WFL for tasks assessed/performed Overall Cognitive Status: Within Functional Limits for tasks assessed                                 General Comments: hard of hearing, following all commands        Exercises      General Comments        Pertinent Vitals/Pain Pain Assessment Pain Assessment: No/denies pain    Home Living  Prior Function            PT Goals (current goals can now be found in the care plan section) Acute Rehab PT Goals PT Goal Formulation: With patient/family Time For Goal Achievement: 01/06/23 Potential to Achieve Goals: Good Progress towards PT goals: Progressing toward goals    Frequency    Min 1X/week      PT Plan      Co-evaluation              AM-PAC PT "6 Clicks" Mobility   Outcome Measure  Help needed turning from your back to your side while in a flat bed without using bedrails?: None Help needed moving from lying on your back to sitting on the side of a flat bed without using  bedrails?: A Little Help needed moving to and from a bed to a chair (including a wheelchair)?: A Little Help needed standing up from a chair using your arms (e.g., wheelchair or bedside chair)?: A Little Help needed to walk in hospital room?: A Little Help needed climbing 3-5 steps with a railing? : A Little 6 Click Score: 19    End of Session Equipment Utilized During Treatment: Gait belt Activity Tolerance: Patient tolerated treatment well Patient left: in chair;with call bell/phone within reach;with chair alarm set;with family/visitor present Nurse Communication: Mobility status PT Visit Diagnosis: Other abnormalities of gait and mobility (R26.89)     Time: 1610-9604 PT Time Calculation (min) (ACUTE ONLY): 24 min  Charges:    $Gait Training: 23-37 mins PT General Charges $$ ACUTE PT VISIT: 1 Visit                    Paulino Door, DPT Physical Therapist Acute Rehabilitation Services Office: [phone]    Kati L Payson 12/23/2022, 1:21 PM

## 2022-12-23 NOTE — Plan of Care (Signed)
  Problem: Coping: Goal: Ability to adjust to condition or change in health will improve Outcome: Progressing   Problem: Skin Integrity: Goal: Risk for impaired skin integrity will decrease Outcome: Progressing   Problem: Tissue Perfusion: Goal: Adequacy of tissue perfusion will improve Outcome: Progressing   Problem: Pain Management: Goal: General experience of comfort will improve Outcome: Progressing   Problem: Safety: Goal: Ability to remain free from injury will improve Outcome: Progressing

## 2022-12-23 NOTE — Progress Notes (Addendum)
PROGRESS NOTE    Roy [name]  ZOX:[id number] DOB: [birth year] DOA: 12/05/2022 PCP: Irena Reichmann, DO   Brief Narrative: 77 year old M with PMH of systolic CHF with ICD, CAD/CABG, remote testicular lymphoma s/p orchiectomy, CKD, DM-2, HTN and diminished hearing presented to ED with generalized weakness, falls, confusion and poor p.o. intake, and admitted for frequent PVCs, generalized weakness, falls and hypomagnesemia.  CT head showed right frontal mass.  MRI brain confirmed right frontal lobe mass suspicious for CNS lymphoma.  CT chest, abdomen and pelvis with no enlarged or progressive lymph nodes.  Oncology and radiation oncology consulted.  Patient was transferred to [facility] for further evaluation and care.   Underwent fluoroscopy guided lumbar puncture on 11/18.  CSF fluid with elevated glucose and protein.  CSF culture negative.  Cytology negative for malignant cells but cytometry with elevated CD19 and CD20.  Started radiation treatment on 11/21.  MRI spine pending.  Therapy recommended SNF once medically ready.  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   MRI brain showed 3.9 x 2.1 x 4.7 cm right frontal mass with additional smaller contrast-enhancing lesions in high right frontal lobe and near the posterior limb of internal capsule on the right.   CT chest, abdomen and pelvis with contrast without evidence of metastasis -Oncology and radiation oncology on board -S/p LP on 11/18. CSF with elevated glucose & protein. Culture negative. Cytology  negative for malignant cells.  Cytometry with elevated CD19 and CD20. -Started radiation treatment on 11/ 21, plan for 17 sessions, per wife oncology has told them that he will be staying in the hospital and completing all sessions before discharge.  Per oncology recommendation, he received Decadron 4 mg Q8h for 3 days>>Q12h starting 12/16/2022 for 20 days.  MRI spine total  completed, 11/21 -no mets   Chronic systolic CHF/cardiomyopathy with ICD: TTE 09/2022 with LVEF of 25 to 30%, GH, G1DD.  On Jardiance, Aldactone, Toprol-XL and losartan at home.  Appears euvolemic on exam.  Denies respiratory symptoms. -Strict intake and output, daily weight, renal functions and electrolytes   CKD-3B: Cr better than baseline   NIDDM-2 with hyperglycemia: A1c 6.2%. Increased Semglee to 20 units and continue NovoLog at 5 units 3 times daily with meals.  Blood sugar now much better controlled.   Acute metabolic encephalopathy: Was multifactorial.  Patient has been alert and oriented for the last 3 days that I have seen him.   Diffuse large B cell lymphoma- left testicular cancer s/p orchiectomy-now with right frontal mass concerning for relapse.  Oncology on board.   CAD/CABG: Stable -Continue home meds   Hypokalemia/hypomagnesemia -Monitor replenish as appropriate   Generalized weakness/frequent falls/fall in the hospital: Patient "slid down to the floor into sitting position" when he got up to go to bathroom the night of 11/20.  No fall with impact.  No injury.  CT head after fall without acute finding.  Evaluated by PT OT, SNF recommended at discharge.   Essential hypertension: bp has been soft holding Cozaar and decreasing metoprolol to  25 mg daily -Continue  Aldactone   PVCs: No ventricular tachycardia. -Continue Toprol-XL -Cardiology recommended outpatient follow-up and signed off. -Optimize electrolytes   Estimated body mass index is 25.47 kg/m as calculated from the following:   Height as of  this encounter: 6' (1.829 m).   Weight as of this encounter: 85.2 kg.  DVT prophylaxis: Lovenox Code Status: Full code Family Communication: None at bedside  disposition Plan:  Status is: Inpatient Remains inpatient appropriate because: Ongoing radiation   Consultants:  Oncology  Procedures: None  Antimicrobials: None  Subjective: Had heart burn better No other c/o Objective: Vitals:   12/22/22 0502 12/22/22 1327 12/22/22 2051 12/23/22 0514  BP: 113/86 104/60 113/71 101/60  Pulse: 76 82 65 60  Resp: 16 18 16 16   Temp: 97.7 F (36.5 C) 98.1 F (36.7 C) 98.1 F (36.7 C) (!) 97.4 F (36.3 C)  TempSrc: Oral Oral Oral Oral  SpO2: 99% 98% 100% 100%  Weight:      Height:        Intake/Output Summary (Last 24 hours) at 12/23/2022 0959 Last data filed at 12/23/2022 0947 Gross per 24 hour  Intake 480 ml  Output 500 ml  Net -20 ml   Filed Weights   12/07/22 0417 12/09/22 0506 12/13/22 1100  Weight: 90.7 kg 89.6 kg 85.2 kg    Examination:  General exam: Appears in nad   Respiratory system: Clear to auscultation. Respiratory effort normal. Cardiovascular system: S1 & S2 heard, RRR. No JVD, murmurs, rubs, gallops or clicks. No pedal edema. Gastrointestinal system: Abdomen is nondistended, soft and nontender. No organomegaly or masses felt. Normal bowel sounds heard. Central nervous system: Alert and oriented. No focal neurological deficits. Extremities: Symmetric 5 x 5 power.  Data Reviewed: I have personally reviewed following labs and imaging studies  CBC: Recent Labs  Lab 12/18/22 0601 12/19/22 0508  WBC 16.4* 18.0*  HGB 11.7* 12.1*  HCT 36.9* 38.5*  MCV 93.4 94.8  PLT 280 294   Basic Metabolic Panel: Recent Labs  Lab 12/18/22 0601 12/19/22 0508  NA 131* 134*  K 4.5 4.4  CL 106 108  CO2 20* 18*  GLUCOSE 174* 169*  BUN 43* 45*  CREATININE 1.48* 1.39*  CALCIUM 8.6* 8.7*  MG 2.1 2.1  PHOS 3.6  --    GFR: Estimated Creatinine Clearance: 47.3  mL/min (A) (by C-G formula based on SCr of 1.39 mg/dL (H)). Liver Function Tests: Recent Labs  Lab 12/18/22 0601  ALBUMIN 3.1*   No results for input(s): "LIPASE", "AMYLASE" in the last 168 hours. No results for input(s): "AMMONIA" in the last 168 hours. Coagulation Profile: No results for input(s): "INR", "PROTIME" in the last 168 hours. Cardiac Enzymes: No results for input(s): "CKTOTAL", "CKMB", "CKMBINDEX", "TROPONINI" in the last 168 hours. BNP (last 3 results) Recent Labs    09/02/22 1000  PROBNP 609*   HbA1C: No results for input(s): "HGBA1C" in the last 72 hours. CBG: Recent Labs  Lab 12/22/22 0743 12/22/22 1139 12/22/22 1637 12/22/22 2053 12/23/22 0732  GLUCAP 124* 220* 178* 170* 156*   Lipid Profile: No results for input(s): "CHOL", "HDL", "LDLCALC", "TRIG", "CHOLHDL", "LDLDIRECT" in the last 72 hours. Thyroid Function Tests: No results for input(s): "TSH", "T4TOTAL", "FREET4", "T3FREE", "THYROIDAB" in the last 72 hours. Anemia Panel: No results for input(s): "VITAMINB12", "FOLATE", "FERRITIN", "TIBC", "IRON", "RETICCTPCT" in the last 72 hours. Sepsis Labs: No results for input(s): "PROCALCITON", "LATICACIDVEN" in the last 168 hours.  No results found for this  or any previous visit (from the past 240 hour(s)).    Radiology Studies: No results found.  Scheduled Meds:  aspirin EC  81 mg Oral Daily   atorvastatin  40 mg Oral Daily   dexamethasone  4 mg Oral Q12H   enoxaparin (LOVENOX) injection  40 mg Subcutaneous Q24H   famotidine  40 mg Oral Daily   feeding supplement  237 mL Oral BID BM   insulin aspart  0-15 Units Subcutaneous TID WC   insulin aspart  0-5 Units Subcutaneous QHS   insulin aspart  5 Units Subcutaneous TID WC   insulin glargine-yfgn  20 Units Subcutaneous Daily   losartan  50 mg Oral Daily   metoprolol  200 mg Oral Daily   pantoprazole  40 mg Oral Daily   spironolactone  12.5 mg Oral Daily   Continuous Infusions:   LOS: 16 days     Time spent: 39 min Alwyn Ren, MD  12/23/2022, 9:59 AM

## 2022-12-24 DIAGNOSIS — C83398 Diffuse large b-cell lymphoma of other extranodal and solid organ sites: Secondary | ICD-10-CM | POA: Diagnosis not present

## 2022-12-24 LAB — COMPREHENSIVE METABOLIC PANEL
ALT: 447 U/L — ABNORMAL HIGH (ref 0–44)
AST: 80 U/L — ABNORMAL HIGH (ref 15–41)
Albumin: 3.1 g/dL — ABNORMAL LOW (ref 3.5–5.0)
Alkaline Phosphatase: 64 U/L (ref 38–126)
Anion gap: 7 (ref 5–15)
BUN: 50 mg/dL — ABNORMAL HIGH (ref 8–23)
CO2: 18 mmol/L — ABNORMAL LOW (ref 22–32)
Calcium: 8.9 mg/dL (ref 8.9–10.3)
Chloride: 110 mmol/L (ref 98–111)
Creatinine, Ser: 1.52 mg/dL — ABNORMAL HIGH (ref 0.61–1.24)
GFR, Estimated: 46 mL/min — ABNORMAL LOW (ref >60)
Glucose, Bld: 158 mg/dL — ABNORMAL HIGH (ref 70–99)
Potassium: 4.8 mmol/L (ref 3.5–5.1)
Sodium: 135 mmol/L (ref 135–145)
Total Bilirubin: 1.2 mg/dL — ABNORMAL HIGH (ref <1.2)
Total Protein: 5.6 g/dL — ABNORMAL LOW (ref 6.5–8.1)

## 2022-12-24 LAB — GLUCOSE, CAPILLARY
Glucose-Capillary: 140 mg/dL — ABNORMAL HIGH (ref 70–99)
Glucose-Capillary: 211 mg/dL — ABNORMAL HIGH (ref 70–99)
Glucose-Capillary: 137 mg/dL — ABNORMAL HIGH (ref 70–99)
Glucose-Capillary: 200 mg/dL — ABNORMAL HIGH (ref 70–99)
Glucose-Capillary: 184 mg/dL — ABNORMAL HIGH (ref 70–99)

## 2022-12-24 LAB — CBC
HCT: 37.3 % — ABNORMAL LOW (ref 39.0–52.0)
Hemoglobin: 11.7 g/dL — ABNORMAL LOW (ref 13.0–17.0)
MCH: 29.8 pg (ref 26.0–34.0)
MCHC: 31.4 g/dL (ref 30.0–36.0)
MCV: 95.2 fL (ref 80.0–100.0)
Platelets: 242 10*3/uL (ref 150–400)
RBC: 3.92 MIL/uL — ABNORMAL LOW (ref 4.22–5.81)
RDW: 13.1 % (ref 11.5–15.5)
WBC: 15.6 10*3/uL — ABNORMAL HIGH (ref 4.0–10.5)
nRBC: 0 % (ref 0.0–0.2)

## 2022-12-24 MED ORDER — POLYETHYLENE GLYCOL 3350 17 G PO PACK
17.0000 g | PACK | Freq: Every day | ORAL | Status: DC
  Administered 2022-12-24 – 2022-12-27 (×3): 17 g via ORAL
  Filled 2022-12-24 (×4): qty 1

## 2022-12-24 NOTE — Progress Notes (Signed)
PROGRESS NOTE    Roy [name]  ZOX:[id number] DOB: [birth year] DOA: 12/05/2022 PCP: Irena Reichmann, DO   Brief Narrative: 77 year old M with PMH of systolic CHF with ICD, CAD/CABG, remote testicular lymphoma s/p orchiectomy, CKD, DM-2, HTN and diminished hearing presented to ED with generalized weakness, falls, confusion and poor p.o. intake, and admitted for frequent PVCs, generalized weakness, falls and hypomagnesemia.  CT head showed right frontal mass.  MRI brain confirmed right frontal lobe mass suspicious for CNS lymphoma.  CT chest, abdomen and pelvis with no enlarged or progressive lymph nodes.  Oncology and radiation oncology consulted.  Patient was transferred to [facility] for further evaluation and care.   Underwent fluoroscopy guided lumbar puncture on 11/18.  CSF fluid with elevated glucose and protein.  CSF culture negative.  Cytology negative for malignant cells but cytometry with elevated CD19 and CD20.  Started radiation treatment on 11/21.  MRI spine pending.  Therapy initially recommended SNF now that he has improved they rec home with hh.  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   MRI brain showed 3.9 x 2.1 x 4.7 cm right frontal mass with additional smaller contrast-enhancing lesions in high right frontal lobe and near the posterior limb of internal capsule on the right.   CT chest, abdomen and pelvis with contrast without evidence of metastasis -Oncology and radiation oncology on board -S/p LP on 11/18. CSF with elevated glucose &  protein. Culture negative. Cytology negative for malignant cells.  Cytometry with elevated CD19 and CD20. -Started radiation treatment on 11/ 21, plan for 17 sessions, per wife oncology has told them that he will be staying in the hospital and completing all sessions before discharge.  Per oncology recommendation, he received Decadron 4 mg Q8h for 3 days>>Q12h starting 12/16/2022 for 20 days.  MRI spine total  completed, 11/21 -no mets   Chronic systolic CHF/cardiomyopathy with ICD: TTE 09/2022 with LVEF of 25 to 30%, GH, G1DD.  On Jardiance, Aldactone, Toprol-XL and losartan at home.  Appears euvolemic on exam.  Denies respiratory symptoms. -Strict intake and output, daily weight, renal functions and electrolytes   CKD-3B: Cr better than baseline   NIDDM-2 with hyperglycemia: A1c 6.2%. Increased Semglee to 20 units and continue NovoLog at 5 units 3 times daily with meals.  Blood sugar now much better controlled.   Acute metabolic encephalopathy: Was multifactorial.  Patient has been alert and oriented for the last 3 days that I have seen him.   Diffuse large B cell lymphoma- left testicular cancer s/p orchiectomy-now with right frontal mass concerning for relapse.  Oncology on board.   CAD/CABG: Stable -Continue home meds   Hypokalemia/hypomagnesemia -Monitor replenish as appropriate   Generalized weakness/frequent falls/fall in the hospital: Patient "slid down to the floor into sitting position" when he got up to go to bathroom the night of 11/20.  No fall with impact.  No injury.  CT head after fall without acute finding.  Evaluated by PT OT, SNF recommended at discharge.   Essential hypertension: bp has  been soft holding Cozaar and decreasing metoprolol to 25 mg daily -Continue  Aldactone   PVCs: No ventricular tachycardia. -Continue Toprol-XL -Cardiology recommended outpatient follow-up and signed off. -Optimize electrolytes   Estimated body mass index is 25.47 kg/m as calculated  from the following:   Height as of this encounter: 6' (1.829 m).   Weight as of this encounter: 85.2 kg.  DVT prophylaxis: Lovenox Code Status: Full code Family Communication: None at bedside  disposition Plan:  Status is: Inpatient Remains inpatient appropriate because: Ongoing radiation   Consultants:  Oncology  Procedures: None  Antimicrobials: None  Subjective:  No c/o Resting in bed  Objective: Vitals:   12/23/22 1003 12/23/22 1345 12/23/22 2024 12/24/22 0420  BP: 112/66 107/65 102/65 129/76  Pulse:  74 66 (!) 57  Resp:  18 16 16   Temp:  97.6 F (36.4 C) (!) 97.5 F (36.4 C) (!) 97.4 F (36.3 C)  TempSrc:  Oral Oral Oral  SpO2:  98% 100% 100%  Weight:      Height:        Intake/Output Summary (Last 24 hours) at 12/24/2022 1341 Last data filed at 12/24/2022 0816 Gross per 24 hour  Intake 720 ml  Output 500 ml  Net 220 ml   Filed Weights   12/07/22 0417 12/09/22 0506 12/13/22 1100  Weight: 90.7 kg 89.6 kg 85.2 kg    Examination:  General exam: Appears in nad   Respiratory system: Clear to auscultation. Respiratory effort normal. Cardiovascular system: S1 & S2 heard, RRR. No JVD, murmurs, rubs, gallops or clicks. No pedal edema. Gastrointestinal system: Abdomen is nondistended, soft and nontender. No organomegaly or masses felt. Normal bowel sounds heard. Central nervous system: Alert and oriented. No focal neurological deficits. Extremities: no edema  Data Reviewed: I have personally reviewed following labs and imaging studies  CBC: Recent Labs  Lab 12/18/22 0601 12/19/22 0508 12/24/22 0623  WBC 16.4* 18.0* 15.6*  HGB 11.7* 12.1* 11.7*  HCT 36.9* 38.5* 37.3*  MCV 93.4 94.8 95.2  PLT 280 294 242   Basic Metabolic Panel: Recent Labs  Lab 12/18/22 0601 12/19/22 0508 12/24/22 0623  NA 131* 134* 135  K 4.5 4.4 4.8  CL 106 108 110  CO2 20* 18* 18*  GLUCOSE 174* 169* 158*  BUN 43* 45* 50*  CREATININE 1.48* 1.39* 1.52*  CALCIUM 8.6* 8.7*  8.9  MG 2.1 2.1  --   PHOS 3.6  --   --    GFR: Estimated Creatinine Clearance: 43.3 mL/min (A) (by C-G formula based on SCr of 1.52 mg/dL (H)). Liver Function Tests: Recent Labs  Lab 12/18/22 0601 12/24/22 0623  AST  --  80*  ALT  --  447*  ALKPHOS  --  64  BILITOT  --  1.2*  PROT  --  5.6*  ALBUMIN 3.1* 3.1*   No results for input(s): "LIPASE", "AMYLASE" in the last 168 hours. No results for input(s): "AMMONIA" in the last 168 hours. Coagulation Profile: No results for input(s): "INR", "PROTIME" in the last 168 hours. Cardiac Enzymes: No results for input(s): "CKTOTAL", "CKMB", "CKMBINDEX", "TROPONINI" in the last 168 hours. BNP (last 3 results) Recent Labs    09/02/22 1000  PROBNP 609*   HbA1C: No results for input(s): "HGBA1C" in the last 72 hours. CBG: Recent Labs  Lab 12/23/22 1129 12/23/22 1626 12/23/22 2137 12/24/22 0731 12/24/22 1138  GLUCAP 179* 144* 163* 140* 211*   Lipid Profile: No results for input(s): "CHOL", "HDL", "LDLCALC", "TRIG", "  CHOLHDL", "LDLDIRECT" in the last 72 hours. Thyroid Function Tests: No results for input(s): "TSH", "T4TOTAL", "FREET4", "T3FREE", "THYROIDAB" in the last 72 hours. Anemia Panel: No results for input(s): "VITAMINB12", "FOLATE", "FERRITIN", "TIBC", "IRON", "RETICCTPCT" in the last 72 hours. Sepsis Labs: No results for input(s): "PROCALCITON", "LATICACIDVEN" in the last 168 hours.  No results found for this or any previous visit (from the past 240 hour(s)).    Radiology Studies: No results found.  Scheduled Meds:  aspirin EC  81 mg Oral Daily   atorvastatin  40 mg Oral Daily   dexamethasone  4 mg Oral Q12H   enoxaparin (LOVENOX) injection  40 mg Subcutaneous Q24H   famotidine  40 mg Oral Daily   feeding supplement  237 mL Oral BID BM   insulin aspart  0-15 Units Subcutaneous TID WC   insulin aspart  0-5 Units Subcutaneous QHS   insulin aspart  5 Units Subcutaneous TID WC   insulin glargine-yfgn  20 Units  Subcutaneous Daily   metoprolol  50 mg Oral Daily   pantoprazole  40 mg Oral Daily   spironolactone  12.5 mg Oral Daily   Continuous Infusions:   LOS: 17 days    Time spent: 39 min Alwyn Ren, MD  12/24/2022, 1:41 PM

## 2022-12-25 DIAGNOSIS — C83398 Diffuse large b-cell lymphoma of other extranodal and solid organ sites: Secondary | ICD-10-CM | POA: Diagnosis not present

## 2022-12-25 LAB — GLUCOSE, CAPILLARY
Glucose-Capillary: 125 mg/dL — ABNORMAL HIGH (ref 70–99)
Glucose-Capillary: 146 mg/dL — ABNORMAL HIGH (ref 70–99)
Glucose-Capillary: 194 mg/dL — ABNORMAL HIGH (ref 70–99)
Glucose-Capillary: 192 mg/dL — ABNORMAL HIGH (ref 70–99)

## 2022-12-25 MED ORDER — INSULIN GLARGINE-YFGN 100 UNIT/ML ~~LOC~~ SOLN
22.0000 [IU] | Freq: Every day | SUBCUTANEOUS | Status: DC
  Administered 2022-12-26 – 2022-12-27 (×2): 22 [IU] via SUBCUTANEOUS
  Filled 2022-12-25 (×2): qty 0.22

## 2022-12-25 NOTE — Plan of Care (Signed)

## 2022-12-25 NOTE — Progress Notes (Signed)
Mobility Specialist - Progress Note   12/25/22 1046  Mobility  Activity Ambulated with assistance to bathroom  Level of Assistance Standby assist, set-up cues, supervision of patient - no hands on  Assistive Device Front wheel walker  Distance Ambulated (ft) 20 ft  Activity Response Tolerated well  Mobility Referral Yes  $Mobility charge 1 Mobility  Mobility Specialist Start Time (ACUTE ONLY) 1041  Mobility Specialist Stop Time (ACUTE ONLY) 1046  Mobility Specialist Time Calculation (min) (ACUTE ONLY) 5 min   Pt received in recliner requesting assistance to the bathroom. Instructed pt to pull call bell when finished. No complaints during session. Pt to bathroom after session with all needs met.    [facility]

## 2022-12-25 NOTE — Progress Notes (Signed)
Mobility Specialist - Progress Note   12/25/22 1112  Mobility  Level of Assistance Standby assist, set-up cues, supervision of patient - no hands on  Assistive Device Front wheel walker  Distance Ambulated (ft) 520 ft  Activity Response Tolerated well  Mobility Referral Yes  $Mobility charge 1 Mobility  Mobility Specialist Start Time (ACUTE ONLY) 1059  Mobility Specialist Stop Time (ACUTE ONLY) 1111  Mobility Specialist Time Calculation (min) (ACUTE ONLY) 12 min   Pt received in recliner and agreeable to mobility. No complaints during session. Pt to recliner after session with all needs met.  Chair alarm on.  [facility]

## 2022-12-25 NOTE — Progress Notes (Signed)
PROGRESS NOTE    Roy [name]  ZOX:[id number] DOB: 12-26-1945 DOA: 12/05/2022 PCP: Irena Reichmann, DO   Brief Narrative: 77 year old M with PMH of systolic CHF with ICD, CAD/CABG, remote testicular lymphoma s/p orchiectomy, CKD, DM-2, HTN and diminished hearing presented to ED with generalized weakness, falls, confusion and poor p.o. intake, and admitted for frequent PVCs, generalized weakness, falls and hypomagnesemia.  CT head showed right frontal mass.  MRI brain confirmed right frontal lobe mass suspicious for CNS lymphoma.  CT chest, abdomen and pelvis with no enlarged or progressive lymph nodes.  Oncology and radiation oncology consulted.  Patient was transferred to [facility] for further evaluation and care.   Underwent fluoroscopy guided lumbar puncture on 11/18.  CSF fluid with elevated glucose and protein.  CSF culture negative.  Cytology negative for malignant cells but cytometry with elevated CD19 and CD20.  Started radiation treatment on 11/21.  MRI spine pending.  Therapy initially recommended SNF now that he has improved they rec home with hh.  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   MRI brain showed 3.9 x 2.1 x 4.7 cm right frontal mass with additional smaller contrast-enhancing lesions in high right frontal lobe and near the posterior limb of internal capsule on the right.   CT chest, abdomen and pelvis with contrast without evidence of metastasis Oncology Dr. Candise Che following patient Radiation oncology on board for radiations up to 17  sessions.  He started with radiation on 12/15/2022 his next session will be on 12/26/2022.  He has not received any radiation in the last 5 days due to holidays. Per oncology he is on Decadron 4 mg Q8 for 3 days and then every 12 hours starting 12/16/2022 for 20 days total. He had a lumbar puncture on 12/12/2022  CSF with elevated glucose & protein. Culture negative. Cytology negative for malignant cells.  Cytometry with elevated CD19 and CD20.  MRI spine total  completed, 11/21 -no mets   Chronic systolic CHF/cardiomyopathy with ICD: He was on Jardiance Toprol losartan and Aldactone at home.  Due to soft blood pressure I have decreased some of these doses.  He has no complaints and appears euvolemic.  Last echo in September 2024 with EF of 25 to 30%.  Holding Cozaar and decrease metoprolol to 25 mg daily due to soft blood pressure continuing Aldactone.   CKD-3B: Stable   NIDDM-2 with hyperglycemia: A1c 6.2%. CBG (last 3)  Recent Labs    12/24/22 2012 12/24/22 2151 12/25/22 0744  GLUCAP 200* 184* 125*    Increase Semglee to 22 units and continue NovoLog at 5 units 3 times daily with meals.     Acute metabolic encephalopathy: Seems to have resolved.  He is awake alert and very sharp  Diffuse large B cell lymphoma- left testicular cancer s/p orchiectomy-now with right frontal mass concerning for relapse.  Oncology on board.   CAD/CABG: Stable -Continue home meds   Hypokalemia/hypomagnesemia-resolved   Generalized weakness/frequent falls/fall in the hospital: Patient was seen by physical therapy initially thought he needs  SNF.  Currently the recommendation is home with home health upon discharge.  Patient has had no further falls since 28 November.  He is ambulating in hallway with therapist.  CT of the head after the fall showed no new injuries.    Essential hypertension: bp has been soft holding Cozaar and decreasing metoprolol to 25 mg daily -Continue  Aldactone   PVCs: No  ventricular tachycardia.  Could continue metoprolol.  Seen by cardiology recommended outpatient follow-up.  Potassium 4.8 magnesium 2.1.  Estimated body mass index is 25.47 kg/m as calculated from the following:   Height as of this encounter: 6' (1.829 m).   Weight as of this encounter: 85.2 kg.  DVT prophylaxis: Lovenox Code Status: Full code Family Communication: None at bedside  disposition Plan:  Status is: Inpatient Remains inpatient appropriate because: Ongoing radiation   Consultants:  Oncology  Procedures: None  Antimicrobials: None  Subjective:  Waiting for breakfast had slept well no complaints  Objective: Vitals:   12/24/22 0420 12/24/22 1418 12/24/22 2011 12/25/22 0433  BP: 129/76 (!) 103/59 (!) 143/76 103/71  Pulse: (!) 57 70 65 79  Resp: 16 18 16 16   Temp: (!) 97.4 F (36.3 C) 98.4 F (36.9 C) (!) 97.4 F (36.3 C) (!) 97.4 F (36.3 C)  TempSrc: Oral Oral Oral Oral  SpO2: 100% 100% 99% 99%  Weight:      Height:        Intake/Output Summary (Last 24 hours) at 12/25/2022 1045 Last data filed at 12/25/2022 1027 Gross per 24 hour  Intake 240 ml  Output 500 ml  Net -260 ml   Filed Weights   12/07/22 0417 12/09/22 0506 12/13/22 1100  Weight: 90.7 kg 89.6 kg 85.2 kg    Examination:  General exam: Appears in nad   Respiratory system: Clear to auscultation. Respiratory effort normal. Cardiovascular system: S1 & S2 heard, RRR. No JVD, murmurs, rubs, gallops or clicks. No pedal edema. Gastrointestinal system: Abdomen is nondistended, soft and nontender. No organomegaly or masses felt. Normal bowel sounds heard. Central nervous system: Alert and oriented. No focal neurological deficits. Extremities: no edema  Data Reviewed: I have personally reviewed following labs and imaging studies  CBC: Recent Labs  Lab 12/19/22 0508 12/24/22 0623  WBC 18.0* 15.6*  HGB 12.1* 11.7*  HCT 38.5* 37.3*  MCV 94.8 95.2  PLT 294 242   Basic Metabolic Panel: Recent  Labs  Lab 12/19/22 0508 12/24/22 0623  NA 134* 135  K 4.4 4.8  CL 108 110  CO2 18* 18*  GLUCOSE 169* 158*  BUN 45* 50*  CREATININE 1.39* 1.52*  CALCIUM 8.7* 8.9  MG 2.1  --    GFR: Estimated Creatinine Clearance: 43.3 mL/min (A) (by C-G formula based on SCr of 1.52 mg/dL (H)). Liver Function Tests: Recent Labs  Lab 12/24/22 0623  AST 80*  ALT 447*  ALKPHOS 64  BILITOT 1.2*  PROT 5.6*  ALBUMIN 3.1*   No results for input(s): "LIPASE", "AMYLASE" in the last 168 hours. No results for input(s): "AMMONIA" in the last 168 hours. Coagulation Profile: No results for input(s): "INR", "PROTIME" in the last 168 hours. Cardiac Enzymes: No results for input(s): "CKTOTAL", "CKMB", "CKMBINDEX", "TROPONINI" in the last 168 hours. BNP (last 3 results) Recent Labs    09/02/22 1000  PROBNP 609*   HbA1C: No results for input(s): "HGBA1C" in the last 72 hours. CBG: Recent Labs  Lab 12/24/22 1138 12/24/22 1624 12/24/22 2012 12/24/22 2151 12/25/22 0744  GLUCAP  211* 137* 200* 184* 125*   Lipid Profile: No results for input(s): "CHOL", "HDL", "LDLCALC", "TRIG", "CHOLHDL", "LDLDIRECT" in the last 72 hours. Thyroid Function Tests: No results for input(s): "TSH", "T4TOTAL", "FREET4", "T3FREE", "THYROIDAB" in the last 72 hours. Anemia Panel: No results for input(s): "VITAMINB12", "FOLATE", "FERRITIN", "TIBC", "IRON", "RETICCTPCT" in the last 72 hours. Sepsis Labs: No results for input(s): "PROCALCITON", "LATICACIDVEN" in the last 168 hours.  No results found for this or any previous visit (from the past 240 hour(s)).    Radiology Studies: No results found.  Scheduled Meds:  aspirin EC  81 mg Oral Daily   atorvastatin  40 mg Oral Daily   dexamethasone  4 mg Oral Q12H   enoxaparin (LOVENOX) injection  40 mg Subcutaneous Q24H   famotidine  40 mg Oral Daily   feeding supplement  237 mL Oral BID BM   insulin aspart  0-15 Units Subcutaneous TID WC   insulin aspart  0-5 Units  Subcutaneous QHS   insulin aspart  5 Units Subcutaneous TID WC   insulin glargine-yfgn  20 Units Subcutaneous Daily   metoprolol  50 mg Oral Daily   pantoprazole  40 mg Oral Daily   polyethylene glycol  17 g Oral Daily   spironolactone  12.5 mg Oral Daily   Continuous Infusions:   LOS: 18 days    Time spent: 39 min Alwyn Ren, MD  12/25/2022, 10:45 AM

## 2022-12-25 NOTE — Plan of Care (Signed)
  Problem: Clinical Measurements: Goal: Ability to maintain clinical measurements within normal limits will improve Outcome: Progressing Goal: Will remain free from infection Outcome: Progressing Goal: Diagnostic test results will improve Outcome: Progressing Goal: Respiratory complications will improve Outcome: Progressing Goal: Cardiovascular complication will be avoided Outcome: Progressing   Problem: Activity: Goal: Risk for activity intolerance will decrease Outcome: Progressing   Problem: Elimination: Goal: Will not experience complications related to bowel motility Outcome: Progressing Goal: Will not experience complications related to urinary retention Outcome: Progressing   Problem: Pain Management: Goal: General experience of comfort will improve Outcome: Progressing   Problem: Safety: Goal: Ability to remain free from injury will improve Outcome: Progressing   Problem: Skin Integrity: Goal: Risk for impaired skin integrity will decrease Outcome: Progressing

## 2022-12-26 ENCOUNTER — Ambulatory Visit: Payer: Medicare HMO

## 2022-12-26 ENCOUNTER — Ambulatory Visit
Admit: 2022-12-26 | Discharge: 2022-12-26 | Disposition: A | Discharge status: Home / Self Care | Payer: Medicare HMO | Attending: Radiation Oncology | Admitting: Radiation Oncology

## 2022-12-26 ENCOUNTER — Other Ambulatory Visit: Payer: Self-pay

## 2022-12-26 DIAGNOSIS — C83398 Diffuse large b-cell lymphoma of other extranodal and solid organ sites: Secondary | ICD-10-CM | POA: Diagnosis not present

## 2022-12-26 DIAGNOSIS — R7989 Other specified abnormal findings of blood chemistry: Secondary | ICD-10-CM | POA: Diagnosis not present

## 2022-12-26 LAB — COMPREHENSIVE METABOLIC PANEL
ALT: 355 U/L — ABNORMAL HIGH (ref 0–44)
AST: 60 U/L — ABNORMAL HIGH (ref 15–41)
Albumin: 3.1 g/dL — ABNORMAL LOW (ref 3.5–5.0)
Alkaline Phosphatase: 64 U/L (ref 38–126)
Anion gap: 3 — ABNORMAL LOW (ref 5–15)
BUN: 44 mg/dL — ABNORMAL HIGH (ref 8–23)
CO2: 18 mmol/L — ABNORMAL LOW (ref 22–32)
Calcium: 8.4 mg/dL — ABNORMAL LOW (ref 8.9–10.3)
Chloride: 111 mmol/L (ref 98–111)
Creatinine, Ser: 1.47 mg/dL — ABNORMAL HIGH (ref 0.61–1.24)
GFR, Estimated: 48 mL/min — ABNORMAL LOW (ref >60)
Glucose, Bld: 148 mg/dL — ABNORMAL HIGH (ref 70–99)
Potassium: 4.2 mmol/L (ref 3.5–5.1)
Sodium: 132 mmol/L — ABNORMAL LOW (ref 135–145)
Total Bilirubin: 0.9 mg/dL (ref <1.2)
Total Protein: 5.7 g/dL — ABNORMAL LOW (ref 6.5–8.1)

## 2022-12-26 LAB — RAD ONC ARIA SESSION SUMMARY
Course Elapsed Days: 12
Plan Fractions Treated to Date: 7
Plan Prescribed Dose Per Fraction: 2.3 Gy
Plan Total Fractions Prescribed: 17
Plan Total Prescribed Dose: 39.1 Gy
Reference Point Dosage Given to Date: 16.1 Gy
Reference Point Session Dosage Given: 2.3 Gy
Session Number: 7

## 2022-12-26 LAB — GLUCOSE, CAPILLARY
Glucose-Capillary: 190 mg/dL — ABNORMAL HIGH (ref 70–99)
Glucose-Capillary: 120 mg/dL — ABNORMAL HIGH (ref 70–99)
Glucose-Capillary: 113 mg/dL — ABNORMAL HIGH (ref 70–99)
Glucose-Capillary: 102 mg/dL — ABNORMAL HIGH (ref 70–99)

## 2022-12-26 LAB — CBC
HCT: 34.8 % — ABNORMAL LOW (ref 39.0–52.0)
Hemoglobin: 11.1 g/dL — ABNORMAL LOW (ref 13.0–17.0)
MCH: 30.2 pg (ref 26.0–34.0)
MCHC: 31.9 g/dL (ref 30.0–36.0)
MCV: 94.8 fL (ref 80.0–100.0)
Platelets: 200 10*3/uL (ref 150–400)
RBC: 3.67 MIL/uL — ABNORMAL LOW (ref 4.22–5.81)
RDW: 13.2 % (ref 11.5–15.5)
WBC: 15.8 10*3/uL — ABNORMAL HIGH (ref 4.0–10.5)
nRBC: 0 % (ref 0.0–0.2)

## 2022-12-26 NOTE — Progress Notes (Signed)
Occupational Therapy Treatment Patient Details Name: Roy [name] MRN: [id number] DOB: 1945-06-16 Today's Date: 12/26/2022   History of present illness Pt is a 77 y/o admitted 12/05/22 for weakness and slipping out of chair at home unable to stand up.  Noted on AICD severe spike in PVC and pt with hypomagnesemia. 11/13 head CT with Rt frontal lobe mass (concern for CNS Relapse) currently WBRT for right frontal lobe brain mass; continue follow w/Rad Onc.  PMH - testicular CA s/p orchiecetomy, large B cell lymphoma, HTN, HLD, DM, CAD s/p CABG x 4 2022, CHF, cardiomyopathy s/p ICD, recurrent syncope.   OT comments  Pt participating in OT session with encouragement from spouse. Pt completes bed mobility with supervision, sit <> stand transfers x 4 with CGA first attempt with vcs to push up from bed. Remaining attempts supervision and good carryover of education. Functional mobility in hallway with RW + CGA for safety. Pt left upright in recliner, discharge recommendation updated. Pt will benefit from [facility] OT services upon hospital discharge to improve independence in daily routines. OT will continue POC (goals updated, making good progress) for functional gains.       If plan is discharge home, recommend the following:  A little help with walking and/or transfers;A little help with bathing/dressing/bathroom;Assistance with cooking/housework;Supervision due to cognitive status   Equipment Recommendations  BSC/3in1       Precautions / Restrictions Precautions Precautions: Fall Restrictions Weight Bearing Restrictions: No       Mobility Bed Mobility Overal bed mobility: Needs Assistance Bed Mobility: Supine to Sit     Supine to sit: HOB elevated, Used rails Sit to supine: Supervision   General bed mobility comments: increased time    Transfers Overall transfer level: Needs assistance Equipment used: Rolling walker (2 wheels) Transfers: Sit to/from Stand Sit to Stand: Contact guard  assist, Supervision     Step pivot transfers: Contact guard assist     General transfer comment: CGA from bed, completed 4x STS transfers with good technique after first education by OT     Balance Overall balance assessment: Needs assistance, History of Falls Sitting-balance support: Feet supported Sitting balance-Leahy Scale: Good Sitting balance - Comments: pt with poor awareness to center hips and align LE's to midline at EOB, slight Lt lean but corrected with manual assist to reposition Lt LE.   Standing balance support: Bilateral upper extremity supported, During functional activity, Reliant on assistive device for balance Standing balance-Leahy Scale: Fair                             ADL either performed or assessed with clinical judgement   ADL Overall ADL's : Needs assistance/impaired                                     Functional mobility during ADLs: Contact guard assist;Rolling walker (2 wheels) General ADL Comments: patient participated in functional mobility during session. Focused on sit <> stand transfers x 4, and mobility in hallway      Cognition Arousal: Alert Behavior During Therapy: [facility] for tasks assessed/performed Overall Cognitive Status: Within Functional Limits for tasks assessed                         Following Commands: Follows one step commands consistently Safety/Judgement: Decreased awareness of deficits, Decreased awareness of safety  General Comments: hard of hearing, following all commands                   Pertinent Vitals/ Pain       Pain Assessment Pain Assessment: No/denies pain Faces Pain Scale: No hurt   Frequency  Min 1X/week        Progress Toward Goals  OT Goals(current goals can now be found in the care plan section)  Progress towards OT goals: Progressing toward goals  Acute Rehab OT Goals OT Goal Formulation: With patient Time For Goal Achievement:  01/02/23 Potential to Achieve Goals: Fair ADL Goals Pt Will Perform Grooming: with supervision;standing Pt Will Perform Lower Body Dressing: sit to/from stand;with supervision Pt Will Transfer to Toilet: with contact guard assist;ambulating;regular height toilet Pt/caregiver will Perform Home Exercise Program: Increased strength;Both right and left upper extremity;With theraband;With written HEP provided  Plan         AM-PAC OT "6 Clicks" Daily Activity     Outcome Measure   Help from another person eating meals?: A Little Help from another person taking care of personal grooming?: A Little Help from another person toileting, which includes using toliet, bedpan, or urinal?: A Little Help from another person bathing (including washing, rinsing, drying)?: A Lot Help from another person to put on and taking off regular upper body clothing?: A Little Help from another person to put on and taking off regular lower body clothing?: A Little 6 Click Score: 17    End of Session Equipment Utilized During Treatment: Gait belt;Rolling walker (2 wheels)  OT Visit Diagnosis: Unsteadiness on feet (R26.81);Muscle weakness (generalized) (M62.81);History of falling (Z91.81)   Activity Tolerance Patient tolerated treatment well   Patient Left in chair;with call bell/phone within reach;with chair alarm set;with family/visitor present   Nurse Communication Mobility status        Time: 1610-9604 OT Time Calculation (min): 17 min  Charges: OT General Charges $OT Visit: 1 Visit OT Treatments $Therapeutic Activity: 8-22 mins  Donavyn Fecher L. Zenobia Kuennen, OTR/L  12/26/22, 1:26 PM

## 2022-12-26 NOTE — Progress Notes (Signed)
PROGRESS NOTE    Roy [name]  ZOX:[id number] DOB: [birth year] DOA: 12/05/2022 PCP: Irena Reichmann, DO   Brief Narrative: 77 year old M with PMH of systolic CHF with ICD, CAD/CABG, remote testicular lymphoma s/p orchiectomy, CKD, DM-2, HTN and diminished hearing presented to ED with generalized weakness, falls, confusion and poor p.o. intake, and admitted for frequent PVCs, generalized weakness, falls and hypomagnesemia.  CT head showed right frontal mass.  MRI brain confirmed right frontal lobe mass suspicious for CNS lymphoma.  CT chest, abdomen and pelvis with no enlarged or progressive lymph nodes.  Oncology and radiation oncology consulted.  Patient was transferred to [facility] for further evaluation and care.   Underwent fluoroscopy guided lumbar puncture on 11/18.  CSF fluid with elevated glucose and protein.  CSF culture negative.  Cytology negative for malignant cells but cytometry with elevated CD19 and CD20.  Started radiation treatment on 11/21.  MRI spine pending.  Therapy initially recommended SNF now that he has improved they rec home with hh.  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   MRI brain showed 3.9 x 2.1 x 4.7 cm right frontal mass with additional smaller contrast-enhancing lesions in high right frontal lobe and near the posterior limb of internal capsule on the right.   CT chest, abdomen and pelvis with contrast without evidence of metastasis Oncology Dr. Candise Che following patient Radiation oncology on board for radiations up to 17  sessions.  He started with radiation on 12/15/2022 his next session will be on 12/26/2022.  He has not received any radiation in the last 5 days due to holidays. Radiation treatments 7 out of 17 on 12/26/2022 Per oncology he is on Decadron 4 mg Q8 for 3 days and then every 12 hours starting 12/16/2022 for 20 days total. He had a lumbar puncture on 12/12/2022  CSF with elevated glucose & protein. Culture negative. Cytology negative for malignant cells.  Cytometry with elevated CD19 and CD20.  MRI spine total  completed, 11/21 -no mets Patient walked with PT over the weekend now recommendation is to go home with home health   Chronic systolic CHF/cardiomyopathy with ICD: He was on Jardiance Toprol losartan and Aldactone at home.  Due to soft blood pressure I have decreased some of these doses.  He has no complaints and appears euvolemic.  Last echo in September 2024 with EF of 25 to 30%.  Holding Cozaar and decrease metoprolol to 25 mg daily due to soft blood pressure continuing Aldactone.   CKD-3B: Stable   NIDDM-2 with hyperglycemia: A1c 6.2%. CBG (last 3)  Recent Labs    12/25/22 2129 12/26/22 0733 12/26/22 1219  GLUCAP 192* 190* 120*    Increase Semglee to 22 units and continue NovoLog at 5 units 3 times daily with meals.     Acute metabolic encephalopathy: Seems to have resolved.  He is awake alert and very sharp  Diffuse large B cell lymphoma- left testicular cancer s/p orchiectomy-now with right frontal mass concerning for relapse.  Oncology on board.   CAD/CABG: Stable -  Continue home meds   Hypokalemia/hypomagnesemia-resolved   Generalized weakness/frequent falls/fall in the hospital: Patient was seen by physical therapy initially thought he needs SNF.  Currently the recommendation is home with home health upon discharge.  Patient has had no further falls since 28 November.  He is ambulating in hallway with therapist.  CT of the head after the fall showed no new injuries.     Essential hypertension: bp has been soft holding Cozaar and decreasing metoprolol to 25 mg daily -Continue  Aldactone   PVCs: No ventricular tachycardia.  Could continue metoprolol.  Seen by cardiology recommended outpatient follow-up.  Potassium 4.8 magnesium 2.1.  Estimated body mass index is 25.47 kg/m as calculated from the following:   Height as of this encounter: 6' (1.829 m).   Weight as of this encounter: 85.2 kg.  DVT prophylaxis: Lovenox Code Status: Full code Family Communication: None at bedside  disposition Plan:  Status is: Inpatient Remains inpatient appropriate because: Ongoing radiation   Consultants:  Oncology  Procedures: None  Antimicrobials: None  Subjective:  Slept well no complaints no nausea vomiting headache diarrhea  Objective: Vitals:   12/25/22 0433 12/25/22 1341 12/25/22 2032 12/26/22 0447  BP: 103/71 (!) 104/58 130/80 121/83  Pulse: 79 75 78   Resp: 16 18 17 16   Temp: (!) 97.4 F (36.3 C) 98.1 F (36.7 C) 97.7 F (36.5 C) 97.6 F (36.4 C)  TempSrc: Oral Oral Oral Oral  SpO2: 99% 98% 100% 100%  Weight:      Height:        Intake/Output Summary (Last 24 hours) at 12/26/2022 1224 Last data filed at 12/26/2022 1013 Gross per 24 hour  Intake 960 ml  Output 300 ml  Net 660 ml   Filed Weights   12/07/22 0417 12/09/22 0506 12/13/22 1100  Weight: 90.7 kg 89.6 kg 85.2 kg    Examination:  General exam: Appears in nad   Respiratory system: Clear to auscultation. Respiratory effort normal. Cardiovascular system: S1 & S2 heard, RRR. No JVD, murmurs, rubs, gallops or clicks. No pedal edema. Gastrointestinal system: Abdomen is nondistended, soft and nontender. No organomegaly or masses felt. Normal bowel sounds heard. Central nervous system: Alert and oriented. No focal neurological deficits. Extremities: no edema  Data Reviewed: I have personally reviewed following labs and imaging studies  CBC: Recent Labs  Lab 12/24/22 0623  WBC  15.6*  HGB 11.7*  HCT 37.3*  MCV 95.2  PLT 242   Basic Metabolic Panel: Recent Labs  Lab 12/24/22 0623  NA 135  K 4.8  CL 110  CO2 18*  GLUCOSE 158*  BUN 50*  CREATININE 1.52*  CALCIUM 8.9   GFR: Estimated Creatinine Clearance: 43.3 mL/min (A) (by C-G formula based on SCr of 1.52 mg/dL (H)). Liver Function Tests: Recent Labs  Lab 12/24/22 0623  AST 80*  ALT 447*  ALKPHOS 64  BILITOT 1.2*  PROT 5.6*  ALBUMIN 3.1*   No results for input(s): "LIPASE", "AMYLASE" in the last 168 hours. No results for input(s): "AMMONIA" in the last 168 hours. Coagulation Profile: No results for input(s): "INR", "PROTIME" in the last 168 hours. Cardiac Enzymes: No results for input(s): "CKTOTAL", "CKMB", "CKMBINDEX", "TROPONINI" in the last 168 hours. BNP (last 3 results) Recent Labs    09/02/22 1000  PROBNP 609*   HbA1C: No results for input(s): "HGBA1C" in the last 72 hours. CBG: Recent Labs  Lab 12/25/22 1131 12/25/22 1624 12/25/22 2129 12/26/22 0733 12/26/22 1219  GLUCAP 146* 194*  192* 190* 120*   Lipid Profile: No results for input(s): "CHOL", "HDL", "LDLCALC", "TRIG", "CHOLHDL", "LDLDIRECT" in the last 72 hours. Thyroid Function Tests: No results for input(s): "TSH", "T4TOTAL", "FREET4", "T3FREE", "THYROIDAB" in the last 72 hours. Anemia Panel: No results for input(s): "VITAMINB12", "FOLATE", "FERRITIN", "TIBC", "IRON", "RETICCTPCT" in the last 72 hours. Sepsis Labs: No results for input(s): "PROCALCITON", "LATICACIDVEN" in the last 168 hours.  No results found for this or any previous visit (from the past 240 hour(s)).    Radiology Studies: No results found.  Scheduled Meds:  aspirin EC  81 mg Oral Daily   atorvastatin  40 mg Oral Daily   dexamethasone  4 mg Oral Q12H   enoxaparin (LOVENOX) injection  40 mg Subcutaneous Q24H   famotidine  40 mg Oral Daily   feeding supplement  237 mL Oral BID BM   insulin aspart  0-15 Units Subcutaneous TID WC   insulin  aspart  0-5 Units Subcutaneous QHS   insulin aspart  5 Units Subcutaneous TID WC   insulin glargine-yfgn  22 Units Subcutaneous Daily   metoprolol  50 mg Oral Daily   pantoprazole  40 mg Oral Daily   polyethylene glycol  17 g Oral Daily   spironolactone  12.5 mg Oral Daily   Continuous Infusions:   LOS: 19 days    Time spent: 39 min Alwyn Ren, MD  12/26/2022, 12:24 PM

## 2022-12-26 NOTE — Progress Notes (Signed)
Physical Therapy Treatment Patient Details Name: Roy [name] MRN: [id number] DOB: 1945-11-11 Today's Date: 12/26/2022   History of Present Illness Pt is a 77 y/o admitted 12/05/22 for weakness and slipping out of chair at home unable to stand up.  Noted on AICD severe spike in PVC and pt with hypomagnesemia. 11/13 head CT with Rt frontal lobe mass (concern for CNS Relapse) currently WBRT for right frontal lobe brain mass; continue follow w/Rad Onc.  PMH - testicular CA s/p orchiecetomy, large B cell lymphoma, HTN, HLD, DM, CAD s/p CABG x 4 2022, CHF, cardiomyopathy s/p ICD, recurrent syncope.    PT Comments  Pt agreeable to working with therapy. He continues to participate well. Wife present during session. Plan is for d/c home once medically ready. Recommend HHPT f/u and RW for ambulation safety.    If plan is discharge home, recommend the following: Assistance with cooking/housework;Assist for transportation;Help with stairs or ramp for entrance;A little help with walking and/or transfers;A little help with bathing/dressing/bathroom   Can travel by private vehicle     Yes  Equipment Recommendations  Rolling walker (2 wheels)    Recommendations for Other Services       Precautions / Restrictions Precautions Precautions: Fall Restrictions Weight Bearing Restrictions: No     Mobility  Bed Mobility Overal bed mobility: Needs Assistance Bed Mobility: Sit to Supine       Sit to supine: Modified independent (Device/Increase time), HOB elevated   General bed mobility comments: increased time    Transfers Overall transfer level: Needs assistance Equipment used: Rolling walker (2 wheels) Transfers: Sit to/from Stand Sit to Stand: Supervision           General transfer comment: Supv for safety.    Ambulation/Gait Ambulation/Gait assistance: Supervision Gait Distance (Feet): 750 Feet Assistive device: Rolling walker (2 wheels) Gait Pattern/deviations: Step-through  pattern, Decreased stride length, Decreased dorsiflexion - left       General Gait Details: pt reports Left LE remains weaker but has improved   Stairs             Wheelchair Mobility     Tilt Bed    Modified Rankin (Stroke Patients Only)       Balance Overall balance assessment: Needs assistance, History of Falls         Standing balance support: Bilateral upper extremity supported, During functional activity, Reliant on assistive device for balance Standing balance-Leahy Scale: Fair                              Cognition Arousal: Alert Behavior During Therapy: WFL for tasks assessed/performed Overall Cognitive Status: Within Functional Limits for tasks assessed                                          Exercises      General Comments        Pertinent Vitals/Pain Pain Assessment Pain Assessment: No/denies pain    Home Living                          Prior Function            PT Goals (current goals can now be found in the care plan section) Progress towards PT goals: Progressing toward goals    Frequency  Min 1X/week      PT Plan      Co-evaluation              AM-PAC PT "6 Clicks" Mobility   Outcome Measure  Help needed turning from your back to your side while in a flat bed without using bedrails?: None Help needed moving from lying on your back to sitting on the side of a flat bed without using bedrails?: A Little Help needed moving to and from a bed to a chair (including a wheelchair)?: A Little Help needed standing up from a chair using your arms (e.g., wheelchair or bedside chair)?: A Little Help needed to walk in hospital room?: A Little Help needed climbing 3-5 steps with a railing? : A Little 6 Click Score: 19    End of Session Equipment Utilized During Treatment: Gait belt Activity Tolerance: Patient tolerated treatment well Patient left: in bed;with call bell/phone within  reach;with bed alarm set;with family/visitor present   PT Visit Diagnosis: Other abnormalities of gait and mobility (R26.89)     Time: 5409-8119 PT Time Calculation (min) (ACUTE ONLY): 19 min  Charges:    $Gait Training: 8-22 mins PT General Charges $$ ACUTE PT VISIT: 1 Visit              Faye Ramsay, PT Acute Rehabilitation  Office: [phone]

## 2022-12-27 ENCOUNTER — Telehealth: Payer: Self-pay

## 2022-12-27 ENCOUNTER — Ambulatory Visit
Admission: RE | Admit: 2022-12-27 | Discharge: 2022-12-27 | Disposition: A | Discharge status: Home / Self Care | Payer: Medicare HMO | Source: Ambulatory Visit | Attending: Radiation Oncology | Admitting: Radiation Oncology

## 2022-12-27 ENCOUNTER — Other Ambulatory Visit: Payer: Self-pay

## 2022-12-27 DIAGNOSIS — C8338 Diffuse large B-cell lymphoma, lymph nodes of multiple sites: Secondary | ICD-10-CM | POA: Insufficient documentation

## 2022-12-27 DIAGNOSIS — Z510 Encounter for antineoplastic radiation therapy: Secondary | ICD-10-CM | POA: Insufficient documentation

## 2022-12-27 LAB — GLUCOSE, CAPILLARY
Glucose-Capillary: 124 mg/dL — ABNORMAL HIGH (ref 70–99)
Glucose-Capillary: 132 mg/dL — ABNORMAL HIGH (ref 70–99)

## 2022-12-27 LAB — RAD ONC ARIA SESSION SUMMARY
Course Elapsed Days: 13
Plan Fractions Treated to Date: 8
Plan Prescribed Dose Per Fraction: 2.3 Gy
Plan Total Fractions Prescribed: 17
Plan Total Prescribed Dose: 39.1 Gy
Reference Point Dosage Given to Date: 18.4 Gy
Reference Point Session Dosage Given: 2.3 Gy
Session Number: 8

## 2022-12-27 MED ORDER — INSULIN GLARGINE-YFGN 100 UNIT/ML ~~LOC~~ SOLN: 22.0000 [IU] | Freq: Every day | SUBCUTANEOUS | 11 refills | Status: DC

## 2022-12-27 MED ORDER — ENSURE ENLIVE PO LIQD: 237.0000 mL | Freq: Two times a day (BID) | ORAL | 12 refills | Status: AC

## 2022-12-27 MED ORDER — DEXAMETHASONE 4 MG PO TABS: 4.0000 mg | ORAL_TABLET | Freq: Two times a day (BID) | ORAL | 0 refills | Status: DC

## 2022-12-27 MED ORDER — POLYETHYLENE GLYCOL 3350 17 G PO PACK: 17.0000 g | PACK | Freq: Every day | ORAL | 0 refills | Status: DC

## 2022-12-27 MED ORDER — INSULIN ASPART 100 UNIT/ML IJ SOLN: 5.0000 [IU] | Freq: Three times a day (TID) | INTRAMUSCULAR | 11 refills | Status: DC

## 2022-12-27 MED ORDER — ACETAMINOPHEN 325 MG PO TABS: 650.0000 mg | ORAL_TABLET | Freq: Four times a day (QID) | ORAL | Status: DC | PRN

## 2022-12-27 MED ORDER — SPIRONOLACTONE 25 MG PO TABS: 12.5000 mg | ORAL_TABLET | Freq: Every day | ORAL | 2 refills | Status: DC

## 2022-12-27 MED ORDER — ONDANSETRON HCL 4 MG PO TABS: 4.0000 mg | ORAL_TABLET | Freq: Four times a day (QID) | ORAL | 0 refills | Status: DC | PRN

## 2022-12-27 MED ORDER — METOPROLOL SUCCINATE ER 50 MG PO TB24: 50.0000 mg | ORAL_TABLET | Freq: Every day | ORAL | 2 refills | Status: DC

## 2022-12-27 NOTE — Discharge Summary (Signed)
Physician Discharge Summary  Roy [name] AOZ:[id number] DOB: 1945-12-28 DOA: 12/05/2022  PCP: Irena Reichmann, DO  Admit date: 12/05/2022 Discharge date: 12/27/2022  Admitted From: Home Disposition: Home  Recommendations for Outpatient Follow-up:  Follow up with PCP in 1-2 weeks Please obtain BMP/CBC in one week Please follow up with Dr. Candise Che Follow-up with radiation oncology Dr. Kathrynn Running Home Health: Yes Equipment/Devices: None  Discharge Condition: Stable CODE STATUS: Full code Diet recommendation: Cardiac diet Brief/Interim Summary:  77 year old M with PMH of systolic CHF with ICD, CAD/CABG, remote testicular lymphoma s/p orchiectomy, CKD, DM-2, HTN and diminished hearing presented to ED with generalized weakness, falls, confusion and poor p.o. intake, and admitted for frequent PVCs, generalized weakness, falls and hypomagnesemia.  CT head showed right frontal mass.  MRI brain confirmed right frontal lobe mass suspicious for CNS lymphoma.  CT chest, abdomen and pelvis with no enlarged or progressive lymph nodes.  Oncology and radiation oncology consulted.  Patient was transferred to [facility] for further evaluation and care.   Underwent fluoroscopy guided lumbar puncture on 11/18.  CSF fluid with elevated glucose and protein.  CSF culture negative.  Cytology negative for malignant cells but cytometry with elevated CD19 and CD20.  Started radiation treatment on 11/21.  MRI spine pending.  Therapy initially recommended SNF now that he has improved they rec home with hh.   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   MRI brain showed 3.9 x 2.1 x 4.7 cm right frontal mass with additional smaller contrast-enhancing lesions in high right frontal lobe and near the posterior limb of internal capsule on the right.   CT chest, abdomen and pelvis with contrast without evidence of metastasis Oncology Dr. Candise Che saw the patient during the hospital stay. Radiation oncology on board for radiations up to 17 sessions.  He started with radiation on 12/15/2022 his next session will be on 12/26/2022. Radiation treatments 8  out of 17 on 12/26/2022 Per oncology he is on Decadron 4 mg Q8 for 3 days and then every 12 hours starting 12/16/2022 for 20 days total. He had a lumbar puncture on 12/12/2022  CSF with elevated glucose & protein. Culture negative. Cytology negative for malignant cells.  Cytometry with elevated CD19 and CD20.  MRI spine total  completed, 11/21 -no mets Patient walked with PT over the weekend now recommendation is to go home with home health   Chronic systolic CHF/cardiomyopathy with ICD: He was on Jardiance Toprol losartan and Aldactone at home.  He due to soft blood pressure I have decreased some of these doses.  He has no complaints and appears euvolemic.  Last echo in September 2024 with EF of 25 to 30%.  Holding Cozaar and decrease metoprolol to 25 mg daily due to soft blood pressure continuing Aldactone.   CKD-3B: Stable   NIDDM-2 with hyperglycemia: A1c 6.2%.  He was discharged on NovoLog and Semglee.  His wife was able to learn how to give him insulin shots at home.  Metformin was stopped on discharge due to elevated creatinine.  Acute metabolic encephalopathy: Seems to have resolved.  He is awake alert and very sharp   Diffuse large B cell lymphoma- left testicular cancer s/p orchiectomy-now with right frontal mass concerning for relapse.  Follow-up with Dr. Candise Che.   CAD/CABG: Stable   Hypokalemia/hypomagnesemia-resolved    Generalized weakness/frequent falls/fall in the hospital: Patient was seen by physical therapy initially thought he needs SNF.  Currently the recommendation is home with home health upon discharge.  Patient has had no further falls since 28 November.  He is ambulating in hallway with therapist.  CT of the head after the fall showed no new injuries.    Essential hypertension: bp has been soft holding Cozaar and decreasing metoprolol to 25 mg daily -Continue  Aldactone   PVCs: No ventricular tachycardia.  Could continue metoprolol.  Seen by cardiology recommended outpatient follow-up.  Potassium 4.8 magnesium 2.1.  Estimated body mass index is 25.47 kg/m as calculated from the following:   Height as of this encounter: 6' (1.829 m).   Weight as of this encounter: 85.2 kg.  Discharge Instructions  Discharge Instructions     Diet - low sodium heart healthy   Complete by: As directed    Increase activity slowly   Complete by: As directed    No wound care   Complete by: As directed       Allergies as of 12/27/2022   No Known Allergies      Medication List     STOP taking these medications    atorvastatin 40 MG tablet Commonly known as: LIPITOR   losartan 25 MG tablet Commonly known as: COZAAR   metFORMIN 1000 MG tablet Commonly known as: GLUCOPHAGE       TAKE these medications    acetaminophen 325 MG tablet Commonly known as: TYLENOL Take 2 tablets (650 mg total) by mouth every 6 (six) hours as needed for mild pain (pain score 1-3) (or Fever >/= 101).   aspirin EC 81 MG tablet Take 81 mg by mouth daily. Swallow whole.   B-Complex Tabs Take 1 tablet by mouth daily.   cyanocobalamin 1000 MCG tablet Commonly known as: VITAMIN B12 Take 1,000 mcg by mouth See admin instructions. 5 times a week   dexamethasone 4 MG tablet Commonly known as: DECADRON Take 1 tablet (4 mg total) by mouth 2 (two) times daily.   feeding supplement Liqd Take 237 mLs by mouth 2 (two)  times daily between meals.   glucosamine-chondroitin 500-400 MG tablet Take 1 tablet by mouth See admin instructions. 5 times weekly   insulin aspart 100 UNIT/ML injection Commonly known as: novoLOG Inject 5 Units into the skin 3 (three) times daily with meals.   insulin glargine-yfgn 100 UNIT/ML injection Commonly known as: SEMGLEE Inject 0.22 mLs (22 Units total) into the skin daily. Start taking on: December 28, 2022   Jardiance 10 MG Tabs tablet Generic drug: empagliflozin TAKE 1 TABLET BY MOUTH DAILY BEFORE BREAKFAST.   metoprolol succinate 50 MG 24 hr tablet Commonly known as: TOPROL-XL Take 1 tablet (50 mg total) by mouth daily. Take with or immediately following a meal. Start taking on: December 28, 2022 What changed:  medication strength how much to take additional instructions   multivitamin with minerals Tabs tablet Take 1 tablet by mouth See admin instructions. 5 times weekly   omeprazole 20 MG capsule Commonly known as: PRILOSEC Take 20 mg by mouth daily.   ondansetron 4 MG tablet Commonly known as: ZOFRAN Take 1 tablet (4 mg  total) by mouth every 6 (six) hours as needed for nausea.   polyethylene glycol 17 g packet Commonly known as: MIRALAX / GLYCOLAX Take 17 g by mouth daily. Start taking on: December 28, 2022   spironolactone 25 MG tablet Commonly known as: ALDACTONE Take 0.5 tablets (12.5 mg total) by mouth daily. Start taking on: December 28, 2022   Vitamin D 50 MCG (2000 UT) Caps Take 2,000 Units by mouth daily. Monday-Friday        Follow-up Information     Irena Reichmann, DO Follow up.   Specialty: Family Medicine Contact information: [address] [place] [address] [place] [address] [phone]         Johney Maine, MD Follow up.   Specialties: Hematology, Oncology Contact information: [address] [place] [address] [phone]         Margaretmary Dys, MD Follow up.   Specialty: Radiation  Oncology Contact information: [address] [place] [address] [phone]         [facility] Follow up.   Specialty: Home Health Services Why: Your home health has been set up with [facility]. The office will call you with start of service information. If you have any questions or concerns please call the number listed above. Contact information: [address] [place] [address] [phone]                No Known Allergies  Consultations:oncology Radiation oncology   Procedures/Studies: MR TOTAL SPINE METS SCREENING  Addendum Date: 12/21/2022   ADDENDUM REPORT: 12/21/2022 16:20 CONTRAST:  8 mL Gadavist. Electronically Signed   By: Marin Roberts M.D.   On: 12/21/2022 16:20   Result Date: 12/21/2022 CLINICAL DATA:  CNS lymphoma.  Right frontal lesion. EXAM: MRI TOTAL SPINE WITHOUT AND WITH CONTRAST TECHNIQUE: Multisequence MR imaging of the spine from the cervical spine to the sacrum was performed prior to and following IV contrast administration for evaluation of spinal metastatic disease. COMPARISON:  CT of the chest, abdomen and pelvis without contrast 12/10/2022. MR head without and with contrast 12/08/2022. FINDINGS: MRI CERVICAL SPINE FINDINGS Alignment: No significant Vertebrae: Marrow signal and vertebral body heights are normal. Mild endplate degenerative changes are noted at C4-5, C5-6 and C6-7. No pathologic enhancement is present. Cord: Normal signal and morphology.  No pathologic enhancement. Posterior Fossa, vertebral arteries, paraspinal tissues: Craniocervical junction is normal. Flow is present in the vertebral arteries bilaterally. Visualized intracranial contents are normal. Disc levels: Degenerative disc disease contributes to some degree of central and foraminal stenosis at C4-5 and C5-6. MRI THORACIC SPINE FINDINGS Alignment: No significant listhesis is present. Normal thoracic kyphosis is present. Vertebrae:  Marrow signal and vertebral body heights are normal. No pathologic enhancement is present. Cord: No pathologic enhancement normal signal and morphology. Is present. Paraspinal and other soft tissues: The visualized paraspinous soft tissues are within normal limits. Visualized lung fields and upper abdomen are normal. Disc levels: No significant thoracic disc disease or stenosis is present. MRI LUMBAR SPINE FINDINGS Segmentation: 5 non rib-bearing lumbar type vertebral bodies are present. The lowest fully formed vertebral body is L5. Alignment:  No significant listhesis is present. Vertebrae: Vertebral body heights and marrow signal are normal. No pathologic enhancement is present. Conus medullaris: Extends to the T12 level and appears normal. Paraspinal and other soft tissues: Limited imaging the abdomen is unremarkable. There is no significant adenopathy. No solid organ lesions are present. Disc levels: Desiccation of fluid signal in  the L5-S1 disc is noted. A mild broad-based disc bulge is present without significant stenosis. No other significant disc disease or stenosis is present in the lumbar spine. IMPRESSION: 1. No evidence for metastatic disease to the spine. 2. 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 3. Mild broad-based disc bulge at L5-S1 without significant stenosis. Electronically Signed: By: Marin Roberts M.D. On: 12/21/2022 15:35   DG FL GUIDED LUMBAR PUNCTURE  Result Date: 12/12/2022 CLINICAL DATA:  CNS lymphoma.  New intracranial mass. EXAM: DIAGNOSTIC LUMBAR PUNCTURE UNDER FLUOROSCOPIC GUIDANCE FLUOROSCOPY: Radiation Exposure Index (as provided by the fluoroscopic device): 10.8 mGy PROCEDURE: Informed consent was obtained from the patient prior to the procedure, including potential complications of headache, allergy, brain herniation and pain. With the patient prone, the lower back was prepped with  Betadine. 1% Lidocaine was used for local anesthesia. Lumbar puncture was performed at the L3-L4 level using a 20 gauge needle with return of clear CSF with an opening pressure of 15 cm water. Ten ml of CSF were obtained for laboratory studies. The patient tolerated the procedure well and there were no apparent complications. IMPRESSION: Successful lumbar puncture. Electronically Signed   By: Genevive Bi M.D.   On: 12/12/2022 16:53   CT HEAD WO CONTRAST ( )  Result Date: 12/12/2022 CLINICAL DATA:  Brain/CNS neoplasm, assess treatment response fall and evaluation for increased ICP prior to LP per IR recommendations. EXAM: CT HEAD WITHOUT CONTRAST TECHNIQUE: Contiguous axial images were obtained from the base of the skull through the vertex without intravenous contrast. RADIATION DOSE REDUCTION: This exam was performed according to the departmental dose-optimization program which includes automated exposure control, adjustment of the mA and/or kV according to patient size and/or use of iterative reconstruction technique. COMPARISON:  Head CT 12/07/2022.  MRI brain 12/08/2022. FINDINGS: Brain: Unchanged hyperdense masses in the anteromedial right frontal lobe and posterior aspect of the right superior frontal gyrus with surrounding vasogenic edema, again suspicious for CNS involvement of lymphoma. Mild mass effect on the right-greater-than-left frontal horns of the lateral ventricles. No hydrocephalus or herniation. No extra-axial collection. Vascular: No hyperdense vessel or unexpected calcification. Skull: No calvarial fracture or suspicious bone lesion. Skull base is unremarkable. Sinuses/Orbits: No acute findings. Other: None. IMPRESSION: 1. Unchanged hyperdense masses in the anteromedial right frontal lobe and posterior aspect of the right superior frontal gyrus with surrounding vasogenic edema, again suspicious for CNS involvement of lymphoma. 2. No hydrocephalus or herniation. Electronically Signed    By: Orvan Falconer M.D.   On: 12/12/2022 12:03   CT CHEST ABDOMEN PELVIS WO CONTRAST  Result Date: 12/10/2022 CLINICAL DATA:  Inpatient, presents with concern for recurrent large B-cell lymphoma. History of left orchiectomy for same in February 2022. Contrast withheld due to renal insufficiency. EXAM: CT CHEST, ABDOMEN AND PELVIS WITHOUT CONTRAST TECHNIQUE: Multidetector CT imaging of the chest, abdomen and pelvis was performed following the standard protocol without IV contrast. RADIATION DOSE REDUCTION: This exam was performed according to the departmental dose-optimization program which includes automated exposure control, adjustment of the mA and/or kV according to patient size and/or use of iterative reconstruction technique. COMPARISON:  Portable chest 12/05/2022, PA Lat chest 09/02/2022, PET-CT studies 12/03/2020 and 05/11/2020, CTA chest 03/21/2020. FINDINGS: CT CHEST FINDINGS Cardiovascular: There is mild cardiomegaly again noted with a left chamber predominance. There are sternotomy sutures and CABG changes. Native coronary arteries are heavily calcified. The pulmonary trunk measures prominent at 3.2 cm  indicating arterial hypertension, unchanged. There is aortic and great vessel atherosclerosis, mild amount without evidence of aortic aneurysm, with descending segment tortuosity. Pulmonary veins are nondistended. A left chest battery has been inserted since the previous PET-CT, with single lead right ventricular wire placement. Mediastinum/Nodes: Subcentimeter right paratracheal and subcarinal mediastinal lymph nodes are unchanged in the interval. No new or progressive adenopathy is seen without contrast. Thyroid is mostly obscured by metal artifact from the pacemaker/AID but normal where visible. Moderate-sized hiatal hernia is increased in size since 2022. The thoracic trachea, main bronchi, thoracic esophagus are unremarkable. Lungs/Pleura: There are new bilateral minimal layering pleural  effusions, slightly greater fluid on the left. No pneumothorax. There is mild subpleural reticulation in the lung bases, but no honeycombing. There is mild posterior atelectasis in the lower lobes. No focal pneumonia is evident. No pulmonary nodules are seen. Musculoskeletal: Osteopenia and thoracic kyphosis and degenerative change. No acute or destructive osseous findings. There are subacute healing fractures of the left lateral fifth, sixth, and seventh ribs and posterolateral left eighth and ninth ribs. 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 CT ABDOMEN PELVIS FINDINGS Hepatobiliary: There are a few small layering stones in the gallbladder but no wall thickening or biliary dilatation. The liver again demonstrating small scattered cysts, but without contrast no suspicious lesion is seen. Pancreas: There is mild pancreatic atrophy. No focal lesion is seen without contrast. Spleen: Unremarkable without contrast. Adrenals/Urinary Tract: There is no adrenal mass. There is no contour deforming mass of either unenhanced kidney. There is no urinary stone or obstruction. Bilateral perinephric fat stranding unchanged. The bladder is thickened but also not fully distended. Correlate clinically for cystitis or hypertrophy versus nondistention. Unchanged. Stomach/Bowel: Hiatal hernia. No dilatation or wall thickening including the appendix. Sigmoid diverticulosis without evidence of diverticulitis. Rectosigmoid patent surgical anastomosis is again shown. Vascular/Lymphatic: No abdominal, pelvic or inguinal adenopathy. Prior left orchiectomy. Moderate aortoiliac atherosclerosis. No AAA. Reproductive: Enlarged prostate, 5.2 cm transverse. Dystrophic calcifications left lobe chronically. Other: No abdominal wall hernia or abnormality. No abdominopelvic ascites. Musculoskeletal: Degenerative changes lumbar spine mild osteopenia. No acute or significant osseous findings. IMPRESSION: 1. No evidence of  enlarged or progressive lymph nodes in the chest, abdomen or pelvis without contrast. 2. New minimal bilateral pleural effusions with mild posterior atelectasis. 3. Cardiomegaly with CABG changes. 4. Prominent pulmonary trunk indicating arterial hypertension, unchanged. 5. Moderate-sized hiatal hernia increased in size since 2022. 6. Cholelithiasis. 7. Diverticulosis without evidence of diverticulitis. 8. Prostatomegaly. 9. Subacute healing fractures of the left lateral fifth, sixth, and seventh ribs and posterolateral left eighth and ninth ribs. 10. Osteopenia and degenerative change. 11. Aortic atherosclerosis. Aortic Atherosclerosis (ICD10-I70.0). Electronically Signed   By: Almira Bar M.D.   On: 12/10/2022 02:53   MR BRAIN W WO CONTRAST  Result Date: 12/08/2022 CLINICAL DATA:  Brain/CNS neoplasm, monitor follow up right frontal mass EXAM: MRI HEAD WITHOUT AND WITH CONTRAST TECHNIQUE: Multiplanar, multiecho pulse sequences of the brain and surrounding structures were obtained without and with intravenous contrast. CONTRAST:  9mL GADAVIST GADOBUTROL 1 MMOL/ML IV SOLN COMPARISON:  Same day CT head FINDINGS: Brain: Negative for an acute infarct. No hemorrhage. No hydrocephalus. No extra-axial fluid collection. No hydrocephalus. There is a background of mild chronic microvascular ischemic change. There is a dominant contrast-enhancing mass right frontal lobe measuring 3.9 x 2.1 x 4.7 Cm (series 7, image 35). There is an additional smaller contrast-enhancing mass near the posterior limb of the internal capsule on the right  measuring 9 x 8 mm (series 11, image 31). There has an additional separate contrast-enhancing lesion in the high right frontal lobe measuring 1.4 x 0.8 cm (series 11, image 48). There is T2/FLAIR hyperintense signal surrounding this lesion. There is evidence of mild diffusion restriction associated with lesions. There is mass effect on the right lateral ventricular system Vascular: Normal  flow voids. Skull and upper cervical spine: Normal marrow signal. Sinuses/Orbits: Small right mastoid effusion. No middle ear effusion. Paranasal sinuses are notable for mucosal thickening in the bilateral ethmoid sinuses. Orbits are unremarkable. Other: None. IMPRESSION: Dominant contrast-enhancing mass in the right frontal lobe measuring 3.9 x 2.1 x 4.7 cm with additional smaller contrast-enhancing lesions in the high right frontal lobe and near the posterior limb of the internal capsule on the right. 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 Electronically Signed   By: Lorenza Cambridge M.D.   On: 12/08/2022 14:30   CT HEAD WO CONTRAST ( )  Result Date: 12/08/2022 CLINICAL DATA:  Neuro deficit with acute stroke suspected. Weakness. EXAM: CT HEAD WITHOUT CONTRAST TECHNIQUE: Contiguous axial images were obtained from the base of the skull through the vertex without intravenous contrast. RADIATION DOSE REDUCTION: This exam was performed according to the departmental dose-optimization program which includes automated exposure control, adjustment of the mA and/or kV according to patient size and/or use of iterative reconstruction technique. COMPARISON:  01/14/2021 brain MRI FINDINGS: Brain: Indistinct masslike high-density in the anterior right frontal region with adjacent vasogenic type pattern of white matter low-density and expansion. There is local mass effect. No meningioma or other mass seen in this region on prior, concerning for aggressive an intra-axial mass, possibly related to the patient's history of lymphoma. No indication of acute infarct, hemorrhage, hydrocephalus, or shift. Vascular: No hyperdense vessel or unexpected calcification. Skull: Normal. Negative for fracture or focal lesion. Sinuses/Orbits: No acute finding. IMPRESSION: Indistinct masslike area in the right frontal lobe with regional swelling and mass effect. Recommend brain MRI with contrast. Electronically Signed   By: Tiburcio Pea  M.D.   On: 12/08/2022 06:27   DG Chest Port 1 View  Result Date: 12/05/2022 CLINICAL DATA:  Shortness of breath EXAM: PORTABLE CHEST 1 VIEW COMPARISON:  Chest x-ray 09/02/2022 FINDINGS: Sternotomy wires, mediastinal clips and left-sided pacemaker are again seen. The heart size and mediastinal contours are within normal limits. Both lungs are clear. The visualized skeletal structures are unremarkable. IMPRESSION: No active disease. Electronically Signed   By: Darliss Cheney M.D.   On: 12/05/2022 23:55   CUP PACEART REMOTE DEVICE CHECK  Result Date: 12/01/2022 Scheduled remote reviewed. Normal device function.  Recent increase in PVC's per trends, 918/hr over last 24hrs Thoracic impedance trending down Next remote 91 days. LA, CVRS  (Echo, Carotid, EGD, Colonoscopy, ERCP)    Subjective: Anxious to go home  Discharge Exam: Vitals:   12/26/22 2110 12/27/22 0441  BP: 127/81 123/82  Pulse: 62 68  Resp: 18 18  Temp: 98 F (36.7 C) (!) 97.5 F (36.4 C)  SpO2: 100% 100%   Vitals:   12/26/22 0447 12/26/22 1525 12/26/22 2110 12/27/22 0441  BP: 121/83 120/75 127/81 123/82  Pulse:  (!) 58 62 68  Resp: 16 18 18 18   Temp: 97.6 F (36.4 C) 97.6 F (36.4 C) 98 F (36.7 C) (!) 97.5 F (36.4 C)  TempSrc: Oral Oral Oral Oral  SpO2: 100% 100% 100% 100%  Weight:      Height:        General: Pt  is alert, awake, not in acute distress Cardiovascular: RRR, S1/S2 +, no rubs, no gallops Respiratory: CTA bilaterally, no wheezing, no rhonchi Abdominal: Soft, NT, ND, bowel sounds + Extremities: no edema, no cyanosis    The results of significant diagnostics from this hospitalization (including imaging, microbiology, ancillary and laboratory) are listed below for reference.     Microbiology: No results found for this or any previous visit (from the past 240 hour(s)).   Labs: BNP (last 3 results) Recent Labs    12/05/22 2134  BNP 1,431.6*   Basic Metabolic Panel: Recent Labs  Lab  12/24/22 0623 12/26/22 1634  NA 135 132*  K 4.8 4.2  CL 110 111  CO2 18* 18*  GLUCOSE 158* 148*  BUN 50* 44*  CREATININE 1.52* 1.47*  CALCIUM 8.9 8.4*   Liver Function Tests: Recent Labs  Lab 12/24/22 0623 12/26/22 1634  AST 80* 60*  ALT 447* 355*  ALKPHOS 64 64  BILITOT 1.2* 0.9  PROT 5.6* 5.7*  ALBUMIN 3.1* 3.1*   No results for input(s): "LIPASE", "AMYLASE" in the last 168 hours. No results for input(s): "AMMONIA" in the last 168 hours. CBC: Recent Labs  Lab 12/24/22 0623 12/26/22 1634  WBC 15.6* 15.8*  HGB 11.7* 11.1*  HCT 37.3* 34.8*  MCV 95.2 94.8  PLT 242 200   Cardiac Enzymes: No results for input(s): "CKTOTAL", "CKMB", "CKMBINDEX", "TROPONINI" in the last 168 hours. BNP: Invalid input(s): "POCBNP" CBG: Recent Labs  Lab 12/26/22 1219 12/26/22 1714 12/26/22 2113 12/27/22 0747 12/27/22 1135  GLUCAP 120* 113* 102* 124* 132*   D-Dimer No results for input(s): "DDIMER" in the last 72 hours. Hgb A1c No results for input(s): "HGBA1C" in the last 72 hours. Lipid Profile No results for input(s): "CHOL", "HDL", "LDLCALC", "TRIG", "CHOLHDL", "LDLDIRECT" in the last 72 hours. Thyroid function studies No results for input(s): "TSH", "T4TOTAL", "T3FREE", "THYROIDAB" in the last 72 hours.  Invalid input(s): "FREET3" Anemia work up No results for input(s): "VITAMINB12", "FOLATE", "FERRITIN", "TIBC", "IRON", "RETICCTPCT" in the last 72 hours. Urinalysis    Component Value Date/Time   COLORURINE AMBER (A) 12/06/2022 0125   APPEARANCEUR HAZY (A) 12/06/2022 0125   LABSPEC 1.028 12/06/2022 0125   PHURINE 5.0 12/06/2022 0125   GLUCOSEU >=500 (A) 12/06/2022 0125   HGBUR NEGATIVE 12/06/2022 0125   BILIRUBINUR NEGATIVE 12/06/2022 0125   KETONESUR 5 (A) 12/06/2022 0125   PROTEINUR NEGATIVE 12/06/2022 0125   UROBILINOGEN 1.0 03/12/2013 1936   NITRITE NEGATIVE 12/06/2022 0125   LEUKOCYTESUR NEGATIVE 12/06/2022 0125   Sepsis Labs Recent Labs  Lab  12/24/22 0623 12/26/22 1634  WBC 15.6* 15.8*   Microbiology No results found for this or any previous visit (from the past 240 hour(s)).   Time coordinating discharge: 38 minutes  SIGNED: Alwyn Ren, MD  [facility] 12/27/2022, 5:37 PM

## 2022-12-27 NOTE — Telephone Encounter (Signed)
Called pt lvm with dc direct number

## 2022-12-27 NOTE — Telephone Encounter (Signed)
No messages received for at least 21 days Last message received 22 days ago. The patient will be deactivated in 68 days. 

## 2022-12-27 NOTE — Plan of Care (Signed)

## 2022-12-27 NOTE — TOC Transition Note (Signed)
Transition of Care [facility]) - CM/SW Discharge Note   Patient Details  Name: Roy [name] MRN: [id number] Date of Birth: 25-Apr-1945  [facility] CM/SW Contact:  Beckie Busing, RN Phone Number:[phone]  12/27/2022, 12:09 PM   Clinical Narrative:    Patient to discharge home. HH previously set up with [facility]. CM confirmed with [facility] and patient that services have previously been arranged.Wife will transport patient home. Patient reports that he has recommended DME rolling walker at home. There are no other needs noted at this time. TOC will sign off.    Final next level of care: Home w Home Health Services Barriers to Discharge: No Barriers Identified   Patient Goals and CMS Choice CMS Medicare.gov Compare Post Acute Care list provided to:: Patient Represenative (must comment) Choice offered to / list presented to : Spouse  Discharge Placement                         Discharge Plan and Services Additional resources added to the After Visit Summary for     Discharge Planning Services: CM Consult Post Acute Care Choice: Home Health            DME Agency: NA       HH Arranged: PT, OT HH Agency: [facility] Date [facility] Agency Contacted: 12/27/22 Time HH Agency Contacted: [id number] Representative spoke with at [facility] Agency: Confirmed with Kandee Keen  Social Determinants of Health (SDOH) Interventions SDOH Screenings   Food Insecurity: No Food Insecurity (12/06/2022)  Housing: Low Risk  (12/06/2022)  Transportation Needs: No Transportation Needs (12/06/2022)  Utilities: Not At Risk (12/06/2022)  Depression (PHQ2-9): Low Risk  (09/09/2020)  Tobacco Use: Medium Risk (12/05/2022)     Readmission Risk Interventions     No data to display

## 2022-12-27 NOTE — Care Management Important Message (Signed)
Important Message  Patient Details IM Letter given. Name: Roy [name] MRN: [id number] Date of Birth: May 16, 1945   Important Message Given:  Yes - Medicare IM     Caren Macadam 12/27/2022, 11:07 AM

## 2022-12-27 NOTE — Progress Notes (Signed)
This nurse educated wife Jasmine December) regarding insulin administration and dosage along with the appropriate times to give. She did administer the lunch time injection with instructions per this nurse at bedside. Wife voiced understanding. No needs or concerns voiced at this time.

## 2022-12-27 NOTE — Telephone Encounter (Signed)
I spoke with the pt wife and he was in the hospital. The monitor was plugged in the whole time. The pt is going to sleep by the monitor and if it does not send a message she would like for [place] to give her a call back.

## 2022-12-28 ENCOUNTER — Ambulatory Visit
Admission: RE | Admit: 2022-12-28 | Discharge: 2022-12-28 | Disposition: A | Discharge status: Home / Self Care | Payer: Medicare HMO | Source: Ambulatory Visit | Attending: Radiation Oncology | Admitting: Radiation Oncology

## 2022-12-28 ENCOUNTER — Other Ambulatory Visit: Payer: Self-pay

## 2022-12-28 DIAGNOSIS — C83398 Diffuse large b-cell lymphoma of other extranodal and solid organ sites: Secondary | ICD-10-CM | POA: Insufficient documentation

## 2022-12-28 DIAGNOSIS — C8599 Non-Hodgkin lymphoma, unspecified, extranodal and solid organ sites: Secondary | ICD-10-CM | POA: Diagnosis not present

## 2022-12-28 DIAGNOSIS — Z510 Encounter for antineoplastic radiation therapy: Secondary | ICD-10-CM | POA: Insufficient documentation

## 2022-12-28 LAB — RAD ONC ARIA SESSION SUMMARY
Course Elapsed Days: 14
Plan Fractions Treated to Date: 9
Plan Prescribed Dose Per Fraction: 2.3 Gy
Plan Total Fractions Prescribed: 17
Plan Total Prescribed Dose: 39.1 Gy
Reference Point Dosage Given to Date: 20.7 Gy
Reference Point Session Dosage Given: 2.3 Gy
Session Number: 9

## 2022-12-28 NOTE — Telephone Encounter (Signed)
Transmission received.

## 2022-12-29 ENCOUNTER — Ambulatory Visit
Admission: RE | Admit: 2022-12-29 | Discharge: 2022-12-29 | Disposition: A | Discharge status: Home / Self Care | Payer: Medicare HMO | Source: Ambulatory Visit | Attending: Radiation Oncology | Admitting: Radiation Oncology

## 2022-12-29 ENCOUNTER — Other Ambulatory Visit: Payer: Self-pay

## 2022-12-29 DIAGNOSIS — C83398 Diffuse large b-cell lymphoma of other extranodal and solid organ sites: Secondary | ICD-10-CM | POA: Diagnosis not present

## 2022-12-29 DIAGNOSIS — C8599 Non-Hodgkin lymphoma, unspecified, extranodal and solid organ sites: Secondary | ICD-10-CM | POA: Diagnosis not present

## 2022-12-29 DIAGNOSIS — Z510 Encounter for antineoplastic radiation therapy: Secondary | ICD-10-CM | POA: Insufficient documentation

## 2022-12-29 LAB — RAD ONC ARIA SESSION SUMMARY
Course Elapsed Days: 15
Plan Fractions Treated to Date: 10
Plan Prescribed Dose Per Fraction: 2.3 Gy
Plan Total Fractions Prescribed: 17
Plan Total Prescribed Dose: 39.1 Gy
Reference Point Dosage Given to Date: 23 Gy
Reference Point Session Dosage Given: 2.3 Gy
Session Number: 10

## 2022-12-30 ENCOUNTER — Ambulatory Visit
Admission: RE | Admit: 2022-12-30 | Discharge: 2022-12-30 | Disposition: A | Discharge status: Home / Self Care | Payer: Medicare HMO | Source: Ambulatory Visit | Attending: Radiation Oncology | Admitting: Radiation Oncology

## 2022-12-30 ENCOUNTER — Other Ambulatory Visit: Payer: Self-pay

## 2022-12-30 DIAGNOSIS — C8599 Non-Hodgkin lymphoma, unspecified, extranodal and solid organ sites: Secondary | ICD-10-CM | POA: Diagnosis not present

## 2022-12-30 DIAGNOSIS — C8338 Diffuse large B-cell lymphoma, lymph nodes of multiple sites: Secondary | ICD-10-CM | POA: Diagnosis not present

## 2022-12-30 DIAGNOSIS — Z510 Encounter for antineoplastic radiation therapy: Secondary | ICD-10-CM | POA: Diagnosis not present

## 2022-12-30 LAB — RAD ONC ARIA SESSION SUMMARY
Course Elapsed Days: 16
Plan Fractions Treated to Date: 11
Plan Prescribed Dose Per Fraction: 2.3 Gy
Plan Total Fractions Prescribed: 17
Plan Total Prescribed Dose: 39.1 Gy
Reference Point Dosage Given to Date: 25.3 Gy
Reference Point Session Dosage Given: 2.3 Gy
Session Number: 11

## 2023-01-01 DIAGNOSIS — I493 Ventricular premature depolarization: Secondary | ICD-10-CM | POA: Diagnosis not present

## 2023-01-01 DIAGNOSIS — E1122 Type 2 diabetes mellitus with diabetic chronic kidney disease: Secondary | ICD-10-CM | POA: Diagnosis not present

## 2023-01-01 DIAGNOSIS — I130 Hypertensive heart and chronic kidney disease with heart failure and stage 1 through stage 4 chronic kidney disease, or unspecified chronic kidney disease: Secondary | ICD-10-CM | POA: Diagnosis not present

## 2023-01-01 DIAGNOSIS — C8339 Primary central nervous system lymphoma: Secondary | ICD-10-CM | POA: Diagnosis not present

## 2023-01-01 DIAGNOSIS — I5022 Chronic systolic (congestive) heart failure: Secondary | ICD-10-CM | POA: Diagnosis not present

## 2023-01-01 DIAGNOSIS — N1832 Chronic kidney disease, stage 3b: Secondary | ICD-10-CM | POA: Diagnosis not present

## 2023-01-01 DIAGNOSIS — M19031 Primary osteoarthritis, right wrist: Secondary | ICD-10-CM | POA: Diagnosis not present

## 2023-01-01 DIAGNOSIS — I428 Other cardiomyopathies: Secondary | ICD-10-CM | POA: Diagnosis not present

## 2023-01-01 DIAGNOSIS — I2510 Atherosclerotic heart disease of native coronary artery without angina pectoris: Secondary | ICD-10-CM | POA: Diagnosis not present

## 2023-01-02 ENCOUNTER — Encounter: Payer: Self-pay | Admitting: Internal Medicine

## 2023-01-02 ENCOUNTER — Ambulatory Visit: Payer: Medicare HMO | Attending: Internal Medicine | Admitting: Internal Medicine

## 2023-01-02 ENCOUNTER — Other Ambulatory Visit: Payer: Self-pay

## 2023-01-02 ENCOUNTER — Ambulatory Visit
Admission: RE | Admit: 2023-01-02 | Discharge: 2023-01-02 | Disposition: A | Discharge status: Home / Self Care | Payer: Medicare HMO | Source: Ambulatory Visit | Attending: Radiation Oncology | Admitting: Radiation Oncology

## 2023-01-02 VITALS — BP 100/62 | HR 81 | Ht 71.5 in | Wt 181.0 lb

## 2023-01-02 DIAGNOSIS — Z510 Encounter for antineoplastic radiation therapy: Secondary | ICD-10-CM | POA: Diagnosis not present

## 2023-01-02 DIAGNOSIS — Z95810 Presence of automatic (implantable) cardiac defibrillator: Secondary | ICD-10-CM

## 2023-01-02 DIAGNOSIS — C8599 Non-Hodgkin lymphoma, unspecified, extranodal and solid organ sites: Secondary | ICD-10-CM | POA: Diagnosis not present

## 2023-01-02 DIAGNOSIS — C8338 Diffuse large B-cell lymphoma, lymph nodes of multiple sites: Secondary | ICD-10-CM | POA: Diagnosis not present

## 2023-01-02 LAB — RAD ONC ARIA SESSION SUMMARY
Course Elapsed Days: 19
Plan Fractions Treated to Date: 12
Plan Prescribed Dose Per Fraction: 2.3 Gy
Plan Total Fractions Prescribed: 17
Plan Total Prescribed Dose: 39.1 Gy
Reference Point Dosage Given to Date: 27.6 Gy
Reference Point Session Dosage Given: 2.3 Gy
Session Number: 12

## 2023-01-02 NOTE — Patient Instructions (Addendum)
Medication Instructions:  Your physician recommends that you continue on your current medications as directed. Please refer to the Current Medication list given to you today.  *If you need a refill on your cardiac medications before your next appointment, please call your pharmacy*  Lab Work: None ordered.  If you have labs (blood work) drawn today and your tests are completely normal, you will receive your results only by: MyChart Message (if you have MyChart) OR A paper copy in the mail If you have any lab test that is abnormal or we need to change your treatment, we will call you to review the results.  Testing/Procedures: None ordered.  Follow-Up: At [facility], you and your health needs are our priority.  As part of our continuing mission to provide you with exceptional heart care, we have created designated Provider Care Teams.  These Care Teams include your primary Cardiologist (physician) and Advanced Practice Providers (APPs -  Physician Assistants and Nurse Practitioners) who all work together to provide you with the care you need, when you need it.  We recommend signing up for the patient portal called "MyChart".  Sign up information is provided on this After Visit Summary.  MyChart is used to connect with patients for Virtual Visits (Telemedicine).  Patients are able to view lab/test results, encounter notes, upcoming appointments, etc.  Non-urgent messages can be sent to your provider as well.   To learn more about what you can do with MyChart, go to [email or web address].    Your next appointment:   1 year(s)  The format for your next appointment:   In Person  Provider:   Lewayne Bunting, MD{or one of the following Advanced Practice Providers on your designated Care Team:   Francis Dowse, [place] Casimiro Needle "Mardelle Matte" [place], [place] Earnest Rosier, NP  Remote monitoring is used to monitor your Pacemaker/ ICD from home. This monitoring reduces the number of office visits required  to check your device to one time per year. It allows [place] to keep an eye on the functioning of your device to ensure it is working properly. You are scheduled for a device check from home on 03/02/23. You may send your transmission at any time that day. If you have a wireless device, the transmission will be sent automatically. After your physician reviews your transmission, you will receive a postcard with your next transmission date.  Important Information About Sugar

## 2023-01-02 NOTE — Progress Notes (Signed)
HPI Roy [name] returns today for followup. He is a pleasant 77 yo man with a h/o CAD, s/p CABG, a h/o vasovagal syncope, and LV dysfunction. He has a h/o testicular cancer, s/p chemo and xrt. 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 He has not had chest pain, sob or syncope.  No Known Allergies   Current Outpatient Medications  Medication Sig Dispense Refill   acetaminophen (TYLENOL) 325 MG tablet Take 2 tablets (650 mg total) by mouth every 6 (six) hours as needed for mild pain (pain score 1-3) (or Fever >/= 101).     aspirin EC 81 MG tablet Take 81 mg by mouth daily. Swallow whole.     B-Complex TABS Take 1 tablet by mouth daily.     Cholecalciferol (VITAMIN D) 50 MCG (2000 UT) CAPS Take 2,000 Units by mouth daily. Monday-Friday     dexamethasone (DECADRON) 4 MG tablet Take 1 tablet (4 mg total) by mouth 2 (two) times daily. 16 tablet 0   feeding supplement (ENSURE ENLIVE / ENSURE PLUS) LIQD Take 237 mLs by mouth 2 (two) times daily between meals. 237 mL 12   glucosamine-chondroitin 500-400 MG tablet Take 1 tablet by mouth See admin instructions. 5 times weekly     insulin aspart (NOVOLOG) 100 UNIT/ML injection Inject 5 Units into the skin 3 (three) times daily with meals. 10 mL 11   insulin glargine-yfgn (SEMGLEE) 100 UNIT/ML injection Inject 0.22 mLs (22 Units total) into the skin daily. 10 mL 11   JARDIANCE 10 MG TABS tablet TAKE 1 TABLET BY MOUTH DAILY BEFORE BREAKFAST. 30 tablet 3   metoprolol succinate (TOPROL-XL) 50 MG 24 hr tablet Take 1 tablet (50 mg total) by mouth daily. Take with or immediately following a meal. 30 tablet 2   Multiple Vitamin (MULTIVITAMIN WITH MINERALS) TABS tablet Take 1 tablet by mouth See admin instructions. 5 times weekly     omeprazole (PRILOSEC) 20 MG capsule Take 20 mg by mouth daily.     ondansetron (ZOFRAN) 4 MG tablet Take 1 tablet (4 mg total) by mouth every 6 (six) hours as needed for nausea. 20 tablet 0   polyethylene glycol (MIRALAX  / GLYCOLAX) 17 g packet Take 17 g by mouth daily. 14 each 0   spironolactone (ALDACTONE) 25 MG tablet Take 0.5 tablets (12.5 mg total) by mouth daily. 30 tablet 2   vitamin B-12 (CYANOCOBALAMIN) 1000 MCG tablet Take 1,000 mcg by mouth See admin instructions. 5 times a week     No current facility-administered medications for this visit.     Past Medical History:  Diagnosis Date   Allergic rhinitis    Arthritis    wrists   Cardiomyopathy, nonischemic (HCC)    followed by cardiology--- dr Delton See---  2016 ef 45-50% ,  2016 nuclear ef 37%,  2017 per echo ef 50-55%   CHF (congestive heart failure), NYHA class III (HCC) 03/21/2020   Coronary artery disease    GERD (gastroesophageal reflux disease)    Hiatal hernia    History of kidney stones    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;   01/ 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   History of syncope (03-06-2020 pt stated has not had sycopal episode in few years, stated it seems to happen in extreme hot conditions)   cardiologist--- dr Eloy End--- dx recurrent syncope;  nuclear study 11-03-2014 intermediate risk w/  no ischemia, apical hypokinesis, nuclear ef 37%;  event monitor-- 12-21-2015 SB/ ST  no pauses/ arrythmia's;  echo 08-03-2015 ef 50-55%   Hyperlipidemia    Hypertension    followed by pcp   Hypovitaminosis D    Mass of left testicle    Nocturia    Plantar fasciitis    Presence of surgical incision    01/ 2022  moh's w/ skin graft of nose, per pt still healing and wear bandage daily   Type 2 diabetes mellitus (HCC)    pt is adament that he is not and have been told he is a diabetic but a borderline;  followed by pcp, in pcp note states DM2 and takes 2 meds daily   Wears glasses    Wears hearing aid in both ears    Weight loss 11/16/2020    ROS:   All systems reviewed and negative except as noted in the HPI.   Past Surgical History:  Procedure Laterality Date   COLONOSCOPY  last one  01-30-2017   CORONARY ARTERY BYPASS GRAFT N/A 03/25/2020   Procedure: CORONARY ARTERY BYPASS GRAFTING (CABG), ON PUMP, TIMES FOUR, USING BILATERAL INTERNAL MAMMARY ARTERIES AND LEFT RADIAL ARTERY;  Surgeon: Linden Dolin, MD;  Location: [facility] OR;  Service: Open Heart Surgery;  Laterality: N/A;   ICD IMPLANT N/A 08/23/2021   Procedure: ICD IMPLANT;  Surgeon: Marinus Maw, MD;  Location: [facility] INVASIVE CV LAB;  Service: Cardiovascular;  Laterality: N/A;   IR IMAGING GUIDED PORT INSERTION  07/06/2020   LOW ANTERIOR RESECTION RECTUM W/ COLOPROCTOSTOMY  05/2003   MOHS SURGERY  01/2020   nose w/ graft   ORCHIECTOMY Left 03/11/2020   Procedure: Clearance Coots;  Surgeon: Rene Paci, MD;  Location: [facility];  Service: Urology;  Laterality: Left;  ONLY NEEDS 60 MIN   PORTA CATH REMOVAL N/A 08/23/2021   Procedure: PORTA CATH REMOVAL;  Surgeon: Marinus Maw, MD;  Location: [facility] INVASIVE CV LAB;  Service: Cardiovascular;  Laterality: N/A;   RADIAL ARTERY HARVEST Left 03/25/2020   Procedure: LEFT RADIAL ARTERY HARVEST;  Surgeon: Linden Dolin, MD;  Location: [facility] OR;  Service: Open Heart Surgery;  Laterality: Left;   RIGHT/LEFT HEART CATH AND CORONARY ANGIOGRAPHY N/A 03/23/2020   Procedure: RIGHT/LEFT HEART CATH AND CORONARY ANGIOGRAPHY;  Surgeon: Corky Crafts, MD;  Location: [facility] INVASIVE CV LAB;  Service: Cardiovascular;  Laterality: N/A;   SHOULDER SURGERY Right 1992; 07/ 2021   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 Left 12/24/2008   left ear and nose    TEE WITHOUT CARDIOVERSION N/A 03/25/2020   Procedure: TRANSESOPHAGEAL ECHOCARDIOGRAM (TEE);  Surgeon: Linden Dolin, MD;  Location: [facility] OR;  Service: Open Heart Surgery;  Laterality: N/A;   TONSILLECTOMY AND ADENOIDECTOMY  child   UPPER GASTROINTESTINAL ENDOSCOPY  last one 06-06-2017     Family History  Problem Relation Age of Onset   Heart attack Mother    Stroke Father    Colon cancer Neg Hx     Esophageal cancer Neg Hx    Pancreatic cancer Neg Hx    Rectal cancer Neg Hx    Stomach cancer Neg Hx    Colon polyps Neg Hx      Social History   Socioeconomic History   Marital status: Married    Spouse name: Not on file   Number of children: Not on file   Years of education: Not on file   Highest education level:  Not on file  Occupational History   Not on file  Tobacco Use   Smoking status: Former    Types: Pipe    Quit date: 11/29/1976    Years since quitting: 46.1   Smokeless tobacco: Never  Vaping Use   Vaping status: Never Used  Substance and Sexual Activity   Alcohol use: Yes    Alcohol/week: 0.0 standard drinks of alcohol    Comment: Occasional drink    Drug use: Never   Sexual activity: Not on file  Other Topics Concern   Not on file  Social History Narrative   Not on file   Social Determinants of Health   Financial Resource Strain: Not on file  Food Insecurity: No Food Insecurity (12/06/2022)   Hunger Vital Sign    Worried About Running Out of Food in the Last Year: Never true    Ran Out of Food in the Last Year: Never true  Transportation Needs: No Transportation Needs (12/06/2022)   PRAPARE - Administrator, Civil Service (Medical): No    Lack of Transportation (Non-Medical): No  Physical Activity: Not on file  Stress: Not on file  Social Connections: Not on file  Intimate Partner Violence: Not At Risk (12/06/2022)   Humiliation, Afraid, Rape, and Kick questionnaire    Fear of Current or Ex-Partner: No    Emotionally Abused: No    Physically Abused: No    Sexually Abused: No     BP 100/62   Pulse 81   Ht 5' 11.5" (1.816 m)   Wt 181 lb (82.1 kg)   SpO2 98%   BMI 24.89 kg/m   Physical Exam:  Well appearing NAD HEENT: Unremarkable Neck:  No JVD, no thyromegally Lymphatics:  No adenopathy Back:  No CVA tenderness Lungs:  Clear with no wheezes HEART:  Regular rate rhythm, no murmurs, no rubs, no clicks Abd:  soft,  positive bowel sounds, no organomegally, no rebound, no guarding Ext:  2 plus pulses, no edema, no cyanosis, no clubbing Skin:  No rashes no nodules Neuro:  CN II through XII intact, motor grossly intact  EKG - NSR with PVC's  DEVICE  Normal device function.  See PaceArt for details.   Assess/Plan:  HFREF with an EF of 20% despite GDMT - he has class 2 symptoms. He will continue his current meds. CAD - he is s/p CABG and denies anginal symptoms. No change in meds. Syncope - No more spells since he passed out in our office about 4 months ago. ICD - his Biotronik VDD ICD is working normally. PVC's - he is asymptomatic. His other medical problems and lack of symptoms preclude treating his PVC's.    Sharlot Gowda Lashara Urey,MD

## 2023-01-03 ENCOUNTER — Encounter: Payer: Medicare HMO | Admitting: Internal Medicine

## 2023-01-03 ENCOUNTER — Other Ambulatory Visit: Payer: Self-pay

## 2023-01-03 ENCOUNTER — Ambulatory Visit
Admission: RE | Admit: 2023-01-03 | Discharge: 2023-01-03 | Disposition: A | Discharge status: Home / Self Care | Payer: Medicare HMO | Source: Ambulatory Visit | Attending: Radiation Oncology | Admitting: Radiation Oncology

## 2023-01-03 DIAGNOSIS — C8338 Diffuse large B-cell lymphoma, lymph nodes of multiple sites: Secondary | ICD-10-CM | POA: Diagnosis not present

## 2023-01-03 DIAGNOSIS — Z510 Encounter for antineoplastic radiation therapy: Secondary | ICD-10-CM | POA: Diagnosis not present

## 2023-01-03 DIAGNOSIS — C8599 Non-Hodgkin lymphoma, unspecified, extranodal and solid organ sites: Secondary | ICD-10-CM | POA: Diagnosis not present

## 2023-01-03 LAB — RAD ONC ARIA SESSION SUMMARY
Course Elapsed Days: 20
Plan Fractions Treated to Date: 13
Plan Prescribed Dose Per Fraction: 2.3 Gy
Plan Total Fractions Prescribed: 17
Plan Total Prescribed Dose: 39.1 Gy
Reference Point Dosage Given to Date: 29.9 Gy
Reference Point Session Dosage Given: 2.3 Gy
Session Number: 13

## 2023-01-04 ENCOUNTER — Ambulatory Visit: Payer: Medicare HMO | Admitting: Cardiovascular Disease

## 2023-01-04 ENCOUNTER — Other Ambulatory Visit: Payer: Self-pay

## 2023-01-04 ENCOUNTER — Ambulatory Visit
Admission: RE | Admit: 2023-01-04 | Discharge: 2023-01-04 | Disposition: A | Discharge status: Home / Self Care | Payer: Medicare HMO | Source: Ambulatory Visit | Attending: Radiation Oncology | Admitting: Radiation Oncology

## 2023-01-04 DIAGNOSIS — I130 Hypertensive heart and chronic kidney disease with heart failure and stage 1 through stage 4 chronic kidney disease, or unspecified chronic kidney disease: Secondary | ICD-10-CM | POA: Diagnosis not present

## 2023-01-04 DIAGNOSIS — I428 Other cardiomyopathies: Secondary | ICD-10-CM | POA: Diagnosis not present

## 2023-01-04 DIAGNOSIS — N1832 Chronic kidney disease, stage 3b: Secondary | ICD-10-CM | POA: Diagnosis not present

## 2023-01-04 DIAGNOSIS — M19031 Primary osteoarthritis, right wrist: Secondary | ICD-10-CM | POA: Diagnosis not present

## 2023-01-04 DIAGNOSIS — C8338 Diffuse large B-cell lymphoma, lymph nodes of multiple sites: Secondary | ICD-10-CM | POA: Diagnosis not present

## 2023-01-04 DIAGNOSIS — I2510 Atherosclerotic heart disease of native coronary artery without angina pectoris: Secondary | ICD-10-CM | POA: Diagnosis not present

## 2023-01-04 DIAGNOSIS — I493 Ventricular premature depolarization: Secondary | ICD-10-CM | POA: Diagnosis not present

## 2023-01-04 DIAGNOSIS — C8339 Primary central nervous system lymphoma: Secondary | ICD-10-CM | POA: Diagnosis not present

## 2023-01-04 DIAGNOSIS — I5022 Chronic systolic (congestive) heart failure: Secondary | ICD-10-CM | POA: Diagnosis not present

## 2023-01-04 DIAGNOSIS — C8599 Non-Hodgkin lymphoma, unspecified, extranodal and solid organ sites: Secondary | ICD-10-CM | POA: Diagnosis not present

## 2023-01-04 DIAGNOSIS — Z510 Encounter for antineoplastic radiation therapy: Secondary | ICD-10-CM | POA: Diagnosis not present

## 2023-01-04 DIAGNOSIS — E1122 Type 2 diabetes mellitus with diabetic chronic kidney disease: Secondary | ICD-10-CM | POA: Diagnosis not present

## 2023-01-04 LAB — RAD ONC ARIA SESSION SUMMARY
Course Elapsed Days: 21
Plan Fractions Treated to Date: 14
Plan Prescribed Dose Per Fraction: 2.3 Gy
Plan Total Fractions Prescribed: 17
Plan Total Prescribed Dose: 39.1 Gy
Reference Point Dosage Given to Date: 32.2 Gy
Reference Point Session Dosage Given: 2.3 Gy
Session Number: 14

## 2023-01-05 ENCOUNTER — Other Ambulatory Visit: Payer: Self-pay

## 2023-01-05 ENCOUNTER — Ambulatory Visit
Admission: RE | Admit: 2023-01-05 | Discharge: 2023-01-05 | Disposition: A | Discharge status: Home / Self Care | Payer: Medicare HMO | Source: Ambulatory Visit | Attending: Radiation Oncology | Admitting: Radiation Oncology

## 2023-01-05 DIAGNOSIS — E1122 Type 2 diabetes mellitus with diabetic chronic kidney disease: Secondary | ICD-10-CM | POA: Diagnosis not present

## 2023-01-05 DIAGNOSIS — Z510 Encounter for antineoplastic radiation therapy: Secondary | ICD-10-CM | POA: Diagnosis not present

## 2023-01-05 DIAGNOSIS — I5022 Chronic systolic (congestive) heart failure: Secondary | ICD-10-CM | POA: Diagnosis not present

## 2023-01-05 DIAGNOSIS — C8338 Diffuse large B-cell lymphoma, lymph nodes of multiple sites: Secondary | ICD-10-CM | POA: Diagnosis not present

## 2023-01-05 DIAGNOSIS — I493 Ventricular premature depolarization: Secondary | ICD-10-CM | POA: Diagnosis not present

## 2023-01-05 DIAGNOSIS — M19031 Primary osteoarthritis, right wrist: Secondary | ICD-10-CM | POA: Diagnosis not present

## 2023-01-05 DIAGNOSIS — C8339 Primary central nervous system lymphoma: Secondary | ICD-10-CM | POA: Diagnosis not present

## 2023-01-05 DIAGNOSIS — C8599 Non-Hodgkin lymphoma, unspecified, extranodal and solid organ sites: Secondary | ICD-10-CM | POA: Diagnosis not present

## 2023-01-05 DIAGNOSIS — I428 Other cardiomyopathies: Secondary | ICD-10-CM | POA: Diagnosis not present

## 2023-01-05 DIAGNOSIS — N1832 Chronic kidney disease, stage 3b: Secondary | ICD-10-CM | POA: Diagnosis not present

## 2023-01-05 DIAGNOSIS — I130 Hypertensive heart and chronic kidney disease with heart failure and stage 1 through stage 4 chronic kidney disease, or unspecified chronic kidney disease: Secondary | ICD-10-CM | POA: Diagnosis not present

## 2023-01-05 DIAGNOSIS — I2510 Atherosclerotic heart disease of native coronary artery without angina pectoris: Secondary | ICD-10-CM | POA: Diagnosis not present

## 2023-01-05 LAB — RAD ONC ARIA SESSION SUMMARY
Course Elapsed Days: 22
Plan Fractions Treated to Date: 15
Plan Prescribed Dose Per Fraction: 2.3 Gy
Plan Total Fractions Prescribed: 17
Plan Total Prescribed Dose: 39.1 Gy
Reference Point Dosage Given to Date: 34.5 Gy
Reference Point Session Dosage Given: 2.3 Gy
Session Number: 15

## 2023-01-05 NOTE — Progress Notes (Signed)
Cardiology Office Note:  .   Date:  01/10/2023  ID:  Roy [name], DOB [birth year], MRN [id number] PCP: Irena Reichmann, DO  [facility] Providers Cardiologist:  Reatha Harps, MD }   History of Present Illness: .   Roy [name] is a 77 y.o. male history of coronary artery disease, status post CABG, vagovagal syncope and LV dysfunction.  We are seeing him status post hospitalization where he was admitted for acute on chronic systolic heart failure, frontal mass of his brain, hypertension.  He was started on radiation therapy in the hospital.    He has been diagnosed with testicular cancer status post chemo and radiation.  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  Was last seen by Dr. Sharrell Ku on 01/02/2023.  Most recent echo revealed an EF of 20% despite GDMT with class II symptoms.  ICD Biotronik VDD ICD was working normally when evaluated last.  He comes today with his wife and states that he is just finished his radiation treatments yesterday.  He is due to follow-up with oncology in 1 month and they are going to repeat the MRI of his brain.  He is currently undergoing physical therapy and Occupational Therapy due to generalized fatigue associated with treatments.  He is also tapering down his dexamethasone dose at the request of radiation oncology and this has been adjusted in his med rec.  He denies any palpitations chest pain dyspnea on exertion or lower extremity edema.  He does have fatigue as mentioned above and is working on Print production planner.  He has been medically compliant.  Most recent labs were drawn during recent hospitalization in early December 2024.  ROS: As above otherwise negative.  Studies Reviewed: .      Echocardiogram 09/29/2022 1. Left ventricular ejection fraction, by estimation, is 25 to 30%. Left  ventricular ejection fraction by PLAX is 32 %. The left ventricle has  severely decreased function. The left ventricle demonstrates global   hypokinesis. Left ventricular diastolic  parameters are consistent with Grade I diastolic dysfunction (impaired  relaxation).   2. Right ventricular systolic function is mildly reduced. The right  ventricular size is normal. Tricuspid regurgitation signal is inadequate  for assessing PA pressure.   3. Left atrial size was moderately dilated.   4. The mitral valve is normal in structure. No evidence of mitral valve  regurgitation. No evidence of mitral stenosis.   5. The aortic valve is tricuspid. There is mild calcification of the  aortic valve. Aortic valve regurgitation is not visualized. Aortic valve  sclerosis is present, with no evidence of aortic valve stenosis.   6. There is borderline dilatation of the aortic root, measuring 38 mm.   7. The inferior vena cava is normal in size with greater than 50%  respiratory variability, suggesting right atrial pressure of 3 mmHg.   Physical Exam:   VS:  BP 114/66   Pulse (!) 56   Ht 5' 11.5" (1.816 m)   Wt 186 lb 3.2 oz (84.5 kg)   SpO2 99%   BMI 25.61 kg/m    Wt Readings from Last 3 Encounters:  01/10/23 186 lb 3.2 oz (84.5 kg)  01/02/23 181 lb (82.1 kg)  12/13/22 187 lb 13.3 oz (85.2 kg)    GEN: Well nourished, well developed in no acute distress NECK: No JVD; No carotid bruits CARDIAC: IRRR, frequent extrasystole, no murmurs, rubs, gallops RESPIRATORY:  Clear to auscultation without rales, wheezing or rhonchi  ABDOMEN: Soft, non-tender, non-distended EXTREMITIES:  No edema; No deformity   ASSESSMENT AND PLAN: .    Chronic systolic heart failure: The patient remains on Jardiance, metoprolol, spironolactone,.  He is not currently on diuretic.  He appears euvolemic today in the office on assessment.  He denies any symptoms of decompensation currently.  Continue GDMT.  Will see him again in 6 months unless symptomatic.  2. CAD: Status post CABG.  Denies any cardiac symptoms.  Continue secondary prevention with cholesterol  management, blood pressure control,.   3.  Right right frontal lobe brain mass: The patient had an MRI which showed a 3.9 x 2.1 x 4.7 right frontal mass with additional smaller contrast-enhancing lesions and high frontal lobe and near the posterior limb of the internal capsule on the right, .  He is undergoing treatment through oncology and is just finished up radiation therapy.  He has generalized fatigue associated is down physical therapy and Occupational Therapy.  4.  ICD in situ: Followed by Dr. Sharrell Ku.  No changes in his regimen ICD has been interrogated per protocol.  5.  Insulin-dependent diabetes: Newly diagnosed with use of steroids and is now on insulin.  To follow-up with oncology and primary care as Decadron is being weaned.         Signed, Bettey Mare. Liborio Nixon, ANP, AACC

## 2023-01-06 ENCOUNTER — Ambulatory Visit
Admission: RE | Admit: 2023-01-06 | Discharge: 2023-01-06 | Disposition: A | Discharge status: Home / Self Care | Payer: Medicare HMO | Source: Ambulatory Visit | Attending: Radiation Oncology | Admitting: Radiation Oncology

## 2023-01-06 ENCOUNTER — Other Ambulatory Visit: Payer: Self-pay

## 2023-01-06 DIAGNOSIS — C8599 Non-Hodgkin lymphoma, unspecified, extranodal and solid organ sites: Secondary | ICD-10-CM | POA: Diagnosis not present

## 2023-01-06 DIAGNOSIS — Z510 Encounter for antineoplastic radiation therapy: Secondary | ICD-10-CM | POA: Diagnosis not present

## 2023-01-06 DIAGNOSIS — C8338 Diffuse large B-cell lymphoma, lymph nodes of multiple sites: Secondary | ICD-10-CM | POA: Diagnosis not present

## 2023-01-06 LAB — RAD ONC ARIA SESSION SUMMARY
Course Elapsed Days: 23
Plan Fractions Treated to Date: 16
Plan Prescribed Dose Per Fraction: 2.3 Gy
Plan Total Fractions Prescribed: 17
Plan Total Prescribed Dose: 39.1 Gy
Reference Point Dosage Given to Date: 36.8 Gy
Reference Point Session Dosage Given: 2.3 Gy
Session Number: 16

## 2023-01-07 LAB — CUP PACEART INCLINIC DEVICE CHECK
Date Time Interrogation Session: 20241209155340
Implantable Lead Connection Status: 753985
Implantable Lead Implant Date: 20230731
Implantable Lead Location: 753860
Implantable Lead Model: 436909
Implantable Lead Serial Number: 81532339
Implantable Pulse Generator Implant Date: 20230731
Pulse Gen Model: 429525

## 2023-01-09 ENCOUNTER — Ambulatory Visit
Admission: RE | Admit: 2023-01-09 | Discharge: 2023-01-09 | Disposition: A | Discharge status: Home / Self Care | Payer: Medicare HMO | Source: Ambulatory Visit | Attending: Radiation Oncology | Admitting: Radiation Oncology

## 2023-01-09 ENCOUNTER — Ambulatory Visit: Payer: Medicare HMO

## 2023-01-09 ENCOUNTER — Other Ambulatory Visit: Payer: Self-pay

## 2023-01-09 ENCOUNTER — Encounter: Payer: Self-pay | Admitting: Radiation Oncology

## 2023-01-09 DIAGNOSIS — I428 Other cardiomyopathies: Secondary | ICD-10-CM | POA: Diagnosis not present

## 2023-01-09 DIAGNOSIS — I5022 Chronic systolic (congestive) heart failure: Secondary | ICD-10-CM | POA: Diagnosis not present

## 2023-01-09 DIAGNOSIS — C8339 Primary central nervous system lymphoma: Secondary | ICD-10-CM | POA: Diagnosis not present

## 2023-01-09 DIAGNOSIS — I493 Ventricular premature depolarization: Secondary | ICD-10-CM | POA: Diagnosis not present

## 2023-01-09 DIAGNOSIS — E1122 Type 2 diabetes mellitus with diabetic chronic kidney disease: Secondary | ICD-10-CM | POA: Diagnosis not present

## 2023-01-09 DIAGNOSIS — Z510 Encounter for antineoplastic radiation therapy: Secondary | ICD-10-CM | POA: Diagnosis not present

## 2023-01-09 DIAGNOSIS — I2510 Atherosclerotic heart disease of native coronary artery without angina pectoris: Secondary | ICD-10-CM | POA: Diagnosis not present

## 2023-01-09 DIAGNOSIS — C8599 Non-Hodgkin lymphoma, unspecified, extranodal and solid organ sites: Secondary | ICD-10-CM | POA: Diagnosis not present

## 2023-01-09 DIAGNOSIS — M19031 Primary osteoarthritis, right wrist: Secondary | ICD-10-CM | POA: Diagnosis not present

## 2023-01-09 DIAGNOSIS — C8338 Diffuse large B-cell lymphoma, lymph nodes of multiple sites: Secondary | ICD-10-CM | POA: Diagnosis not present

## 2023-01-09 DIAGNOSIS — I130 Hypertensive heart and chronic kidney disease with heart failure and stage 1 through stage 4 chronic kidney disease, or unspecified chronic kidney disease: Secondary | ICD-10-CM | POA: Diagnosis not present

## 2023-01-09 DIAGNOSIS — N1832 Chronic kidney disease, stage 3b: Secondary | ICD-10-CM | POA: Diagnosis not present

## 2023-01-09 LAB — RAD ONC ARIA SESSION SUMMARY
Course Elapsed Days: 26
Plan Fractions Treated to Date: 17
Plan Prescribed Dose Per Fraction: 2.3 Gy
Plan Total Fractions Prescribed: 17
Plan Total Prescribed Dose: 39.1 Gy
Reference Point Dosage Given to Date: 39.1 Gy
Reference Point Session Dosage Given: 2.3 Gy
Session Number: 17

## 2023-01-09 NOTE — Progress Notes (Signed)
  Radiation Oncology         [phone] ________________________________  Name: Roy [name] MRN: [id number]  Date: 01/09/2023  DOB: 16-Nov-1945  Chart Note:  Patient completes brain radiation today and has been taking 4 mg Dexamethasone twice daily.  I have advised to take:  2 mg Dexamethasone twice daily for two weeks, then  2 mg Dexamethasone once daily for two weeks, then  Stop  Resume higher dose if headaches or nausea become an issue.  ________________________________  [name], M.D.

## 2023-01-10 ENCOUNTER — Ambulatory Visit: Payer: Medicare HMO | Attending: Adult Health | Admitting: Adult Health

## 2023-01-10 ENCOUNTER — Encounter: Payer: Self-pay | Admitting: Adult Health

## 2023-01-10 VITALS — BP 114/66 | HR 56 | Ht 71.5 in | Wt 186.2 lb

## 2023-01-10 DIAGNOSIS — I130 Hypertensive heart and chronic kidney disease with heart failure and stage 1 through stage 4 chronic kidney disease, or unspecified chronic kidney disease: Secondary | ICD-10-CM | POA: Diagnosis not present

## 2023-01-10 DIAGNOSIS — I5042 Chronic combined systolic (congestive) and diastolic (congestive) heart failure: Secondary | ICD-10-CM

## 2023-01-10 DIAGNOSIS — E119 Type 2 diabetes mellitus without complications: Secondary | ICD-10-CM | POA: Diagnosis not present

## 2023-01-10 DIAGNOSIS — Z95810 Presence of automatic (implantable) cardiac defibrillator: Secondary | ICD-10-CM | POA: Diagnosis not present

## 2023-01-10 DIAGNOSIS — I2510 Atherosclerotic heart disease of native coronary artery without angina pectoris: Secondary | ICD-10-CM

## 2023-01-10 DIAGNOSIS — I5022 Chronic systolic (congestive) heart failure: Secondary | ICD-10-CM | POA: Diagnosis not present

## 2023-01-10 DIAGNOSIS — E1122 Type 2 diabetes mellitus with diabetic chronic kidney disease: Secondary | ICD-10-CM | POA: Diagnosis not present

## 2023-01-10 DIAGNOSIS — C8339 Primary central nervous system lymphoma: Secondary | ICD-10-CM | POA: Diagnosis not present

## 2023-01-10 DIAGNOSIS — G9389 Other specified disorders of brain: Secondary | ICD-10-CM | POA: Diagnosis not present

## 2023-01-10 NOTE — Patient Instructions (Signed)
Medication Instructions:  No Changes *If you need a refill on your cardiac medications before your next appointment, please call your pharmacy*   Lab Work: No Labs If you have labs (blood work) drawn today and your tests are completely normal, you will receive your results only by: MyChart Message (if you have MyChart) OR A paper copy in the mail If you have any lab test that is abnormal or we need to change your treatment, we will call you to review the results.   Testing/Procedures: No Testing   Follow-Up: At [facility], you and your health needs are our priority.  As part of our continuing mission to provide you with exceptional heart care, we have created designated Provider Care Teams.  These Care Teams include your primary Cardiologist (physician) and Advanced Practice Providers (APPs -  Physician Assistants and Nurse Practitioners) who all work together to provide you with the care you need, when you need it.  We recommend signing up for the patient portal called "MyChart".  Sign up information is provided on this After Visit Summary.  MyChart is used to connect with patients for Virtual Visits (Telemedicine).  Patients are able to view lab/test results, encounter notes, upcoming appointments, etc.  Non-urgent messages can be sent to your provider as well.   To learn more about what you can do with MyChart, go to [email or web address].    Your next appointment:   6 month(s)  Provider:   Reatha Harps, MD

## 2023-01-10 NOTE — Radiation Completion Notes (Signed)
Patient Name: Roy [name], Roy [name] MRN: [id number] Date of Birth: 1945-07-22 Referring Physician: Wyvonnia Lora, M.D. Date of Service: 2023-01-10 Radiation Oncologist: Margaretmary Bayley, M.D. 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, unspecified, extranodal and solid organ sites Intent: Palliative     ==========DELIVERED PLANS==========  First Treatment Date: 2022-12-14 Last Treatment Date: 2023-01-09   Plan Name: Brain Site: Brain Technique: 3D Mode: Photon Dose Per Fraction: 2.3 Gy Prescribed Dose (Delivered / Prescribed): 39.1 Gy / 39.1 Gy Prescribed Fxs (Delivered / Prescribed): 17 / 17     ==========ON TREATMENT VISIT DATES========== 2022-12-16, 2022-12-26, 2022-12-30, 2023-01-09     ==========UPCOMING VISITS==========       ==========APPENDIX - ON TREATMENT VISIT NOTES==========   See weekly On Treatment Notes in Epic for details in the Media tab (listed as Progress notes on the On Treatment Visit Dates listed above).

## 2023-01-11 DIAGNOSIS — N1832 Chronic kidney disease, stage 3b: Secondary | ICD-10-CM | POA: Diagnosis not present

## 2023-01-11 DIAGNOSIS — C799 Secondary malignant neoplasm of unspecified site: Secondary | ICD-10-CM | POA: Diagnosis not present

## 2023-01-11 DIAGNOSIS — E1159 Type 2 diabetes mellitus with other circulatory complications: Secondary | ICD-10-CM | POA: Diagnosis not present

## 2023-01-11 DIAGNOSIS — I428 Other cardiomyopathies: Secondary | ICD-10-CM | POA: Diagnosis not present

## 2023-01-11 DIAGNOSIS — E878 Other disorders of electrolyte and fluid balance, not elsewhere classified: Secondary | ICD-10-CM | POA: Diagnosis not present

## 2023-01-11 DIAGNOSIS — Z951 Presence of aortocoronary bypass graft: Secondary | ICD-10-CM | POA: Diagnosis not present

## 2023-01-11 DIAGNOSIS — Z09 Encounter for follow-up examination after completed treatment for conditions other than malignant neoplasm: Secondary | ICD-10-CM | POA: Diagnosis not present

## 2023-01-11 DIAGNOSIS — D631 Anemia in chronic kidney disease: Secondary | ICD-10-CM | POA: Diagnosis not present

## 2023-01-12 DIAGNOSIS — I2510 Atherosclerotic heart disease of native coronary artery without angina pectoris: Secondary | ICD-10-CM | POA: Diagnosis not present

## 2023-01-12 DIAGNOSIS — M19031 Primary osteoarthritis, right wrist: Secondary | ICD-10-CM | POA: Diagnosis not present

## 2023-01-12 DIAGNOSIS — C8339 Primary central nervous system lymphoma: Secondary | ICD-10-CM | POA: Diagnosis not present

## 2023-01-12 DIAGNOSIS — I428 Other cardiomyopathies: Secondary | ICD-10-CM | POA: Diagnosis not present

## 2023-01-12 DIAGNOSIS — N1832 Chronic kidney disease, stage 3b: Secondary | ICD-10-CM | POA: Diagnosis not present

## 2023-01-12 DIAGNOSIS — E1122 Type 2 diabetes mellitus with diabetic chronic kidney disease: Secondary | ICD-10-CM | POA: Diagnosis not present

## 2023-01-12 DIAGNOSIS — I130 Hypertensive heart and chronic kidney disease with heart failure and stage 1 through stage 4 chronic kidney disease, or unspecified chronic kidney disease: Secondary | ICD-10-CM | POA: Diagnosis not present

## 2023-01-12 DIAGNOSIS — I5022 Chronic systolic (congestive) heart failure: Secondary | ICD-10-CM | POA: Diagnosis not present

## 2023-01-12 DIAGNOSIS — I493 Ventricular premature depolarization: Secondary | ICD-10-CM | POA: Diagnosis not present

## 2023-01-19 ENCOUNTER — Other Ambulatory Visit: Payer: Self-pay | Admitting: Internal Medicine

## 2023-01-19 DIAGNOSIS — I130 Hypertensive heart and chronic kidney disease with heart failure and stage 1 through stage 4 chronic kidney disease, or unspecified chronic kidney disease: Secondary | ICD-10-CM | POA: Diagnosis not present

## 2023-01-19 DIAGNOSIS — C8339 Primary central nervous system lymphoma: Secondary | ICD-10-CM | POA: Diagnosis not present

## 2023-01-19 DIAGNOSIS — I5022 Chronic systolic (congestive) heart failure: Secondary | ICD-10-CM | POA: Diagnosis not present

## 2023-01-19 DIAGNOSIS — I493 Ventricular premature depolarization: Secondary | ICD-10-CM | POA: Diagnosis not present

## 2023-01-19 DIAGNOSIS — N1832 Chronic kidney disease, stage 3b: Secondary | ICD-10-CM | POA: Diagnosis not present

## 2023-01-19 DIAGNOSIS — E1122 Type 2 diabetes mellitus with diabetic chronic kidney disease: Secondary | ICD-10-CM | POA: Diagnosis not present

## 2023-01-19 DIAGNOSIS — M19031 Primary osteoarthritis, right wrist: Secondary | ICD-10-CM | POA: Diagnosis not present

## 2023-01-19 DIAGNOSIS — I2510 Atherosclerotic heart disease of native coronary artery without angina pectoris: Secondary | ICD-10-CM | POA: Diagnosis not present

## 2023-01-19 DIAGNOSIS — I428 Other cardiomyopathies: Secondary | ICD-10-CM | POA: Diagnosis not present

## 2023-01-30 DIAGNOSIS — C8339 Primary central nervous system lymphoma: Secondary | ICD-10-CM | POA: Diagnosis not present

## 2023-01-30 DIAGNOSIS — I493 Ventricular premature depolarization: Secondary | ICD-10-CM | POA: Diagnosis not present

## 2023-01-30 DIAGNOSIS — I428 Other cardiomyopathies: Secondary | ICD-10-CM | POA: Diagnosis not present

## 2023-01-30 DIAGNOSIS — N1832 Chronic kidney disease, stage 3b: Secondary | ICD-10-CM | POA: Diagnosis not present

## 2023-01-30 DIAGNOSIS — I5022 Chronic systolic (congestive) heart failure: Secondary | ICD-10-CM | POA: Diagnosis not present

## 2023-01-30 DIAGNOSIS — I130 Hypertensive heart and chronic kidney disease with heart failure and stage 1 through stage 4 chronic kidney disease, or unspecified chronic kidney disease: Secondary | ICD-10-CM | POA: Diagnosis not present

## 2023-01-30 DIAGNOSIS — E1122 Type 2 diabetes mellitus with diabetic chronic kidney disease: Secondary | ICD-10-CM | POA: Diagnosis not present

## 2023-01-30 DIAGNOSIS — I2510 Atherosclerotic heart disease of native coronary artery without angina pectoris: Secondary | ICD-10-CM | POA: Diagnosis not present

## 2023-01-30 DIAGNOSIS — M19031 Primary osteoarthritis, right wrist: Secondary | ICD-10-CM | POA: Diagnosis not present

## 2023-02-02 DIAGNOSIS — M19031 Primary osteoarthritis, right wrist: Secondary | ICD-10-CM | POA: Diagnosis not present

## 2023-02-02 DIAGNOSIS — I5022 Chronic systolic (congestive) heart failure: Secondary | ICD-10-CM | POA: Diagnosis not present

## 2023-02-02 DIAGNOSIS — I130 Hypertensive heart and chronic kidney disease with heart failure and stage 1 through stage 4 chronic kidney disease, or unspecified chronic kidney disease: Secondary | ICD-10-CM | POA: Diagnosis not present

## 2023-02-02 DIAGNOSIS — I2510 Atherosclerotic heart disease of native coronary artery without angina pectoris: Secondary | ICD-10-CM | POA: Diagnosis not present

## 2023-02-02 DIAGNOSIS — I428 Other cardiomyopathies: Secondary | ICD-10-CM | POA: Diagnosis not present

## 2023-02-02 DIAGNOSIS — I493 Ventricular premature depolarization: Secondary | ICD-10-CM | POA: Diagnosis not present

## 2023-02-02 DIAGNOSIS — E1122 Type 2 diabetes mellitus with diabetic chronic kidney disease: Secondary | ICD-10-CM | POA: Diagnosis not present

## 2023-02-02 DIAGNOSIS — N1832 Chronic kidney disease, stage 3b: Secondary | ICD-10-CM | POA: Diagnosis not present

## 2023-02-02 DIAGNOSIS — C8339 Primary central nervous system lymphoma: Secondary | ICD-10-CM | POA: Diagnosis not present

## 2023-02-06 DIAGNOSIS — E1122 Type 2 diabetes mellitus with diabetic chronic kidney disease: Secondary | ICD-10-CM | POA: Diagnosis not present

## 2023-02-06 DIAGNOSIS — N1832 Chronic kidney disease, stage 3b: Secondary | ICD-10-CM | POA: Diagnosis not present

## 2023-02-06 DIAGNOSIS — I493 Ventricular premature depolarization: Secondary | ICD-10-CM | POA: Diagnosis not present

## 2023-02-06 DIAGNOSIS — I428 Other cardiomyopathies: Secondary | ICD-10-CM | POA: Diagnosis not present

## 2023-02-06 DIAGNOSIS — I5022 Chronic systolic (congestive) heart failure: Secondary | ICD-10-CM | POA: Diagnosis not present

## 2023-02-06 DIAGNOSIS — I2510 Atherosclerotic heart disease of native coronary artery without angina pectoris: Secondary | ICD-10-CM | POA: Diagnosis not present

## 2023-02-06 DIAGNOSIS — I130 Hypertensive heart and chronic kidney disease with heart failure and stage 1 through stage 4 chronic kidney disease, or unspecified chronic kidney disease: Secondary | ICD-10-CM | POA: Diagnosis not present

## 2023-02-06 DIAGNOSIS — M19031 Primary osteoarthritis, right wrist: Secondary | ICD-10-CM | POA: Diagnosis not present

## 2023-02-06 DIAGNOSIS — C8339 Primary central nervous system lymphoma: Secondary | ICD-10-CM | POA: Diagnosis not present

## 2023-02-07 ENCOUNTER — Ambulatory Visit
Admission: RE | Admit: 2023-02-07 | Discharge: 2023-02-07 | Disposition: A | Discharge status: Home / Self Care | Payer: Medicare HMO | Source: Ambulatory Visit | Attending: Radiation Oncology | Admitting: Radiation Oncology

## 2023-02-07 DIAGNOSIS — Z510 Encounter for antineoplastic radiation therapy: Secondary | ICD-10-CM | POA: Insufficient documentation

## 2023-02-07 DIAGNOSIS — C8338 Diffuse large B-cell lymphoma, lymph nodes of multiple sites: Secondary | ICD-10-CM | POA: Insufficient documentation

## 2023-02-07 NOTE — Progress Notes (Signed)
  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  The patient did  note fatigue during radiation. The patient did not note hair loss or skin changes in the field of radiation during therapy. The patient is not taking dexamethasone . The patient does not have symptoms of  weakness or loss of control of the extremities. The patient does not have symptoms of headache. The patient does not have symptoms of seizure or uncontrolled movement. The patient does not have symptoms of changes in vision. The patient does not have changes in speech. The patient does not have confusion.   The patient was counseled that he  will be contacted by our brain and spine navigator to schedule surveillance imaging. The patient was encouraged to call if he  have not received a call to schedule imaging, or if he  develops concerns or questions regarding radiation. The patient will also continue to follow up with Dr. Onesimo in medical oncology.   This concludes the interaction.  Rosaline Minerva, LPN

## 2023-02-13 ENCOUNTER — Other Ambulatory Visit: Payer: Self-pay

## 2023-02-13 DIAGNOSIS — C8330 Diffuse large B-cell lymphoma, unspecified site: Secondary | ICD-10-CM

## 2023-02-14 ENCOUNTER — Inpatient Hospital Stay: Payer: Medicare HMO | Admitting: Hematology

## 2023-02-14 ENCOUNTER — Inpatient Hospital Stay: Payer: Medicare HMO | Attending: Hematology

## 2023-02-14 VITALS — BP 129/84 | HR 78 | Temp 97.5°F | Resp 15 | Wt 187.1 lb

## 2023-02-14 DIAGNOSIS — I252 Old myocardial infarction: Secondary | ICD-10-CM | POA: Diagnosis not present

## 2023-02-14 DIAGNOSIS — C83398 Diffuse large b-cell lymphoma of other extranodal and solid organ sites: Secondary | ICD-10-CM | POA: Insufficient documentation

## 2023-02-14 DIAGNOSIS — Z85828 Personal history of other malignant neoplasm of skin: Secondary | ICD-10-CM | POA: Diagnosis not present

## 2023-02-14 DIAGNOSIS — E119 Type 2 diabetes mellitus without complications: Secondary | ICD-10-CM | POA: Diagnosis not present

## 2023-02-14 DIAGNOSIS — I10 Essential (primary) hypertension: Secondary | ICD-10-CM | POA: Insufficient documentation

## 2023-02-14 DIAGNOSIS — K219 Gastro-esophageal reflux disease without esophagitis: Secondary | ICD-10-CM | POA: Diagnosis not present

## 2023-02-14 DIAGNOSIS — E785 Hyperlipidemia, unspecified: Secondary | ICD-10-CM | POA: Diagnosis not present

## 2023-02-14 DIAGNOSIS — C8330 Diffuse large B-cell lymphoma, unspecified site: Secondary | ICD-10-CM

## 2023-02-14 DIAGNOSIS — I255 Ischemic cardiomyopathy: Secondary | ICD-10-CM | POA: Insufficient documentation

## 2023-02-14 LAB — CBC WITH DIFFERENTIAL (CANCER CENTER ONLY)
Abs Immature Granulocytes: 0.04 10*3/uL (ref 0.00–0.07)
Basophils Absolute: 0.1 10*3/uL (ref 0.0–0.1)
Basophils Relative: 1 %
Eosinophils Absolute: 0.1 10*3/uL (ref 0.0–0.5)
Eosinophils Relative: 1 %
HCT: 35.9 % — ABNORMAL LOW (ref 39.0–52.0)
Hemoglobin: 11.4 g/dL — ABNORMAL LOW (ref 13.0–17.0)
Immature Granulocytes: 0 %
Lymphocytes Relative: 6 %
Lymphs Abs: 0.6 10*3/uL — ABNORMAL LOW (ref 0.7–4.0)
MCH: 30.2 pg (ref 26.0–34.0)
MCHC: 31.8 g/dL (ref 30.0–36.0)
MCV: 95.2 fL (ref 80.0–100.0)
Monocytes Absolute: 0.7 10*3/uL (ref 0.1–1.0)
Monocytes Relative: 6 %
Neutro Abs: 8.8 10*3/uL — ABNORMAL HIGH (ref 1.7–7.7)
Neutrophils Relative %: 86 %
Platelet Count: 347 10*3/uL (ref 150–400)
RBC: 3.77 MIL/uL — ABNORMAL LOW (ref 4.22–5.81)
RDW: 14.7 % (ref 11.5–15.5)
WBC Count: 10.3 10*3/uL (ref 4.0–10.5)
nRBC: 0 % (ref 0.0–0.2)

## 2023-02-14 LAB — CMP (CANCER CENTER ONLY)
ALT: 9 U/L (ref 0–44)
AST: 11 U/L — ABNORMAL LOW (ref 15–41)
Albumin: 3.6 g/dL (ref 3.5–5.0)
Alkaline Phosphatase: 71 U/L (ref 38–126)
Anion gap: 8 (ref 5–15)
BUN: 14 mg/dL (ref 8–23)
CO2: 24 mmol/L (ref 22–32)
Calcium: 9.7 mg/dL (ref 8.9–10.3)
Chloride: 110 mmol/L (ref 98–111)
Creatinine: 1.51 mg/dL — ABNORMAL HIGH (ref 0.61–1.24)
GFR, Estimated: 47 mL/min — ABNORMAL LOW (ref >60)
Glucose, Bld: 240 mg/dL — ABNORMAL HIGH (ref 70–99)
Potassium: 4.4 mmol/L (ref 3.5–5.1)
Sodium: 142 mmol/L (ref 135–145)
Total Bilirubin: 0.7 mg/dL (ref 0.0–1.2)
Total Protein: 6.7 g/dL (ref 6.5–8.1)

## 2023-02-14 LAB — LACTATE DEHYDROGENASE: LDH: 156 U/L (ref 98–192)

## 2023-02-14 NOTE — Progress Notes (Signed)
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  Patient was last seen by me on 12/21/2022 as in patient and she was doing well overall.   Patient is accompanied by his wife during this visit. Patient notes he hs been doing well overall. He notes his blood sugar levels have been controlled at home with his medication; jardiance. He has discontinued Metformin.   Last radiation treatment was on 01/09/2024. He has not been to rehab after hospitalization. He has been receiving physical therapy sessions, which has been helping him.  Patient reports mild dizziness when gets up and walks, which is slowly improving. Patient notes he had 3 falls in the past 24 hours. He denies any severe injuries other than mild bruises. He does use walking stick.   He notes that his energy has significantly improved since December.   He denies any new infection issues, fever, chills, night sweats, unexpected weight loss, back pain, chest pain, abdominal pain, headaches, change in vision, SOB, or leg swelling. He does report occasional mild headache in the morning that lasts around 45 minutes. He also reports occasional mild cloudy vision in his left eye, describes it as "floaters" lasting for around 2-5 seconds.   MEDICAL HISTORY:  Past Medical History:  Diagnosis Date   Allergic rhinitis    Arthritis    wrists   Cardiomyopathy, nonischemic [facility])    followed by cardiology--- dr Delton See---  2016 ef 45-50% ,  2016 nuclear ef 37%,  2017 per  echo ef 50-55%   CHF (congestive heart failure), NYHA class III (HCC) 03/21/2020   Coronary artery disease    GERD (gastroesophageal reflux disease)    Hiatal hernia    History of kidney stones    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;   01/ 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   History of syncope (03-06-2020 pt stated has not had sycopal episode in few years, stated it seems to happen in extreme hot conditions)   cardiologist--- dr Eloy End--- dx recurrent syncope;  nuclear study 11-03-2014 intermediate risk w/ no ischemia, apical hypokinesis, nuclear ef 37%;  event monitor-- 12-21-2015 SB/ ST  no pauses/ arrythmia's;  echo 08-03-2015 ef 50-55%   Hyperlipidemia    Hypertension    followed by pcp   Hypovitaminosis D    Mass of left testicle    Nocturia    Plantar fasciitis    Presence of surgical incision    01/ 2022  moh's w/ skin graft of nose, per pt still healing and wear bandage daily   Type 2 diabetes mellitus (HCC)    pt is adament that he is not and have been told he is a diabetic but a borderline;  followed by pcp, in pcp note states DM2 and takes 2 meds daily   Wears glasses    Wears hearing aid in both ears    Weight loss 11/16/2020    SURGICAL HISTORY: Past Surgical History:  Procedure Laterality Date   COLONOSCOPY  last one  01-30-2017   CORONARY ARTERY BYPASS GRAFT N/A 03/25/2020   Procedure: CORONARY ARTERY BYPASS GRAFTING (CABG), ON PUMP, TIMES FOUR, USING BILATERAL INTERNAL MAMMARY ARTERIES AND LEFT RADIAL ARTERY;  Surgeon: Linden Dolin, MD;  Location: [facility] OR;  Service: Open Heart Surgery;  Laterality: N/A;   ICD IMPLANT N/A 08/23/2021   Procedure: ICD IMPLANT;  Surgeon: Marinus Maw, MD;  Location[facility] INVASIVE CV LAB;  Service: Cardiovascular;  Laterality: N/A;   IR IMAGING GUIDED PORT INSERTION  07/06/2020   LOW ANTERIOR RESECTION RECTUM W/ COLOPROCTOSTOMY  05/2003   MOHS SURGERY  01/2020   nose w/ graft   ORCHIECTOMY  Left 03/11/2020   Procedure: Clearance Coots;  Surgeon: Rene Paci, MD;  Location: [facility];  Service: Urology;  Laterality: Left;  ONLY NEEDS 60 MIN   PORTA CATH REMOVAL N/A 08/23/2021   Procedure: PORTA CATH REMOVAL;  Surgeon: Marinus Maw, MD;  Location: [facility] INVASIVE CV LAB;  Service: Cardiovascular;  Laterality: N/A;   RADIAL ARTERY HARVEST Left 03/25/2020   Procedure: LEFT RADIAL ARTERY HARVEST;  Surgeon: Linden Dolin, MD;  Location: [facility] OR;  Service: Open Heart Surgery;  Laterality: Left;   RIGHT/LEFT HEART CATH AND CORONARY ANGIOGRAPHY N/A 03/23/2020   Procedure: RIGHT/LEFT HEART CATH AND CORONARY ANGIOGRAPHY;  Surgeon: Corky Crafts, MD;  Location: [facility] INVASIVE CV LAB;  Service: Cardiovascular;  Laterality: N/A;   SHOULDER SURGERY Right 1992; 07/ 2021   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 Left 12/24/2008   left ear and nose    TEE WITHOUT CARDIOVERSION N/A 03/25/2020   Procedure: TRANSESOPHAGEAL ECHOCARDIOGRAM (TEE);  Surgeon: Linden Dolin, MD;  Location: [facility] OR;  Service: Open Heart Surgery;  Laterality: N/A;   TONSILLECTOMY AND ADENOIDECTOMY  child   UPPER GASTROINTESTINAL ENDOSCOPY  last one 06-06-2017    SOCIAL HISTORY: Social History   Socioeconomic History   Marital status: Married    Spouse name: Not on file   Number of children: Not on file   Years of education: Not on file   Highest education level: Not on file  Occupational History   Not on file  Tobacco Use   Smoking status: Former    Types: Pipe    Quit date: 11/29/1976    Years since quitting: 46.2   Smokeless tobacco: Never  Vaping Use   Vaping status: Never Used  Substance and Sexual Activity   Alcohol use: Yes    Alcohol/week: 0.0 standard drinks of alcohol    Comment: Occasional drink    Drug use: Never   Sexual activity: Not on file  Other Topics Concern   Not on file  Social History Narrative   Not on file   Social Drivers of Health    Financial Resource Strain: Not on file  Food Insecurity: No Food Insecurity (12/06/2022)   Hunger Vital Sign    Worried About Running Out of Food in the Last Year: Never true    Ran Out of Food in the Last Year: Never true  Transportation Needs: No Transportation Needs (12/06/2022)   PRAPARE - Administrator, Civil Service (Medical): No    Lack of Transportation (Non-Medical): No  Physical Activity: Not on file  Stress: Not on file  Social Connections: Not on file  Intimate Partner Violence: Not At Risk (12/06/2022)   Humiliation, Afraid, Rape, and Kick questionnaire    Fear of Current or Ex-Partner: No    Emotionally Abused: No  Physically Abused: No    Sexually Abused: No    FAMILY HISTORY: Family History  Problem Relation Age of Onset   Heart attack Mother    Stroke Father    Colon cancer Neg Hx    Esophageal cancer Neg Hx    Pancreatic cancer Neg Hx    Rectal cancer Neg Hx    Stomach cancer Neg Hx    Colon polyps Neg Hx     ALLERGIES:  has no known allergies.  MEDICATIONS:  Current Outpatient Medications  Medication Sig Dispense Refill   acetaminophen (TYLENOL) 325 MG tablet Take 2 tablets (650 mg total) by mouth every 6 (six) hours as needed for mild pain (pain score 1-3) (or Fever >/= 101).     aspirin EC 81 MG tablet Take 81 mg by mouth daily. Swallow whole.     B-Complex TABS Take 1 tablet by mouth daily.     Cholecalciferol (VITAMIN D) 50 MCG (2000 UT) CAPS Take 2,000 Units by mouth daily. Monday-Friday     dexamethasone (DECADRON) 4 MG tablet Take 1 tablet (4 mg total) by mouth 2 (two) times daily. (Patient taking differently: Take 4 mg by mouth 2 (two) times daily. Take 1/2 Tablet Twice Daily For 2 weeks. Then 1/2 Tablet Daily For 2 weeks) 16 tablet 0   empagliflozin (JARDIANCE) 10 MG TABS tablet TAKE 1 TABLET BY MOUTH DAILY BEFORE BREAKFAST. 90 tablet 3   feeding supplement (ENSURE ENLIVE / ENSURE PLUS) LIQD Take 237 mLs by mouth 2 (two) times  daily between meals. 237 mL 12   glucosamine-chondroitin 500-400 MG tablet Take 1 tablet by mouth See admin instructions. 5 times weekly     insulin aspart (NOVOLOG) 100 UNIT/ML injection Inject 5 Units into the skin 3 (three) times daily with meals. 10 mL 11   insulin glargine-yfgn (SEMGLEE) 100 UNIT/ML injection Inject 0.22 mLs (22 Units total) into the skin daily. 10 mL 11   metoprolol succinate (TOPROL-XL) 50 MG 24 hr tablet Take 1 tablet (50 mg total) by mouth daily. Take with or immediately following a meal. 30 tablet 2   Multiple Vitamin (MULTIVITAMIN WITH MINERALS) TABS tablet Take 1 tablet by mouth See admin instructions. 5 times weekly     omeprazole (PRILOSEC) 20 MG capsule Take 20 mg by mouth daily.     ondansetron (ZOFRAN) 4 MG tablet Take 1 tablet (4 mg total) by mouth every 6 (six) hours as needed for nausea. 20 tablet 0   polyethylene glycol (MIRALAX / GLYCOLAX) 17 g packet Take 17 g by mouth daily. 14 each 0   spironolactone (ALDACTONE) 25 MG tablet Take 0.5 tablets (12.5 mg total) by mouth daily. 30 tablet 2   vitamin B-12 (CYANOCOBALAMIN) 1000 MCG tablet Take 1,000 mcg by mouth See admin instructions. 5 times a week     No current facility-administered medications for this visit.    REVIEW OF SYSTEMS:   10 Point review of Systems was done is negative except as noted above.   PHYSICAL EXAMINATION: ECOG PERFORMANCE STATUS: 2 - Symptomatic, <50% confined to bed  .BP 129/84 (BP Location: Left Arm, Patient Position: Sitting)   Pulse 78   Temp (!) 97.5 F (36.4 C) (Temporal)   Resp 15   Wt 187 lb 1.6 oz (84.9 kg)   SpO2 100%   BMI 25.73 kg/m   GENERAL:alert, in no acute distress and comfortable SKIN: no acute rashes, no significant lesions EYES: conjunctiva are pink and non-injected, sclera anicteric OROPHARYNX: MMM, no  exudates, no oropharyngeal erythema or ulceration NECK: supple, no JVD LYMPH:  no palpable lymphadenopathy in the cervical, axillary or inguinal  regions LUNGS: clear to auscultation b/l with normal respiratory effort HEART: regular rate & rhythm ABDOMEN:  normoactive bowel sounds , non tender, not distended. Extremity: no pedal edema PSYCH: alert & oriented x 3 with fluent speech NEURO: no focal motor/sensory deficits  LABORATORY DATA:  I have reviewed the data as listed      Latest Ref Rng & Units 12/26/2022    4:34 PM 12/24/2022    6:23 AM 12/19/2022    5:08 AM  CBC  WBC 4.0 - 10.5 K/uL 15.8  15.6  18.0   Hemoglobin 13.0 - 17.0 g/dL 16.1  09.6  04.5   Hematocrit 39.0 - 52.0 % 34.8  37.3  38.5   Platelets 150 - 400 K/uL 200  242  294     .    Latest Ref Rng & Units 12/26/2022    4:34 PM 12/24/2022    6:23 AM 12/19/2022    5:08 AM  CMP  Glucose 70 - 99 mg/dL 409  811  914   BUN 8 - 23 mg/dL 44  50  45   Creatinine 0.61 - 1.24 mg/dL 7.82  9.56  2.13   Sodium 135 - 145 mmol/L 132  135  134   Potassium 3.5 - 5.1 mmol/L 4.2  4.8  4.4   Chloride 98 - 111 mmol/L 111  110  108   CO2 22 - 32 mmol/L 18  18  18    Calcium 8.9 - 10.3 mg/dL 8.4  8.9  8.7   Total Protein 6.5 - 8.1 g/dL 5.7  5.6    Total Bilirubin <1.2 mg/dL 0.9  1.2    Alkaline Phos 38 - 126 U/L 64  64    AST 15 - 41 U/L 60  80    ALT 0 - 44 U/L 355  447     . Lab Results  Component Value Date   LDH 144 11/07/2022   SURGICAL PATHOLOGY   THIS IS AN ADDENDUM REPORT   CASE: [id number]  PATIENT: Antwone Raffield  Surgical Pathology Report  Addendum    Reason for Addendum #1:  Molecular Genetic Test Results, FISH   Clinical History: Left testicular mass (crm)      FINAL MICROSCOPIC DIAGNOSIS:   A. TESTICLE, LEFT, ORCHIECTOMY:  -  Diffuse aggressive large B-cell lymphoma  -  See comment    COMMENT:   Sections of testicle show architectural effacement by sheets of large  lymphoid cells with vesicular nuclei and pale cytoplasm.  There is  admixed apoptotic debris and increased mitotic activity.  By  immunohistochemistry, the large lymphoid  cells are positive for CD20,  CD5 (dim), BCL6 (dim), MUM1, and BCL2.  They are negative for CD10, CD30  (<1%), cyclin D1, and TdT.  The Ki67 proliferation index is up to  approximately 70%.  CD3 highlights small T-cells in the background. EBV  is negative by in situ hybridization.   Together, the findings support the diagnosis of a diffuse aggressive  large B-cell lymphoma. The differential diagnosis includes a diffuse  large B-cell lymphoma, NOS, with activated B-cell subtype by the [facility]  algorithm and high-grade B-cell lymphoma with MYC and BCL2 or BCL6  rearrangements.  FISH for BCL2, BCL6, and MYC rearrangements will be  performed. Correlation with clinical and radiographic findings is  recommended for consideration of a primary testicular lymphoma.  Result reported to L. Gibson on 03/16/20 at 1720 by S. O'Neill.    ADDENDUM:   FISH RESULTS:   Results: NORMAL   Interpretation:   BCL6 rearrangement:      Not Detected  MYC rearrangement:  Not Detected  MYC amplification:  Not Detected  BCL6 rearrangement:      Not Detected   Please note this testing was performed and interpreted by an outside  facility ([facility]).  This addendum is only being added to provide a  summary of the results for report completeness.  Please see electronic  medical record for a copy of the full report.   RADIOGRAPHIC STUDIES: I have personally reviewed the radiological images as listed and agreed with the findings in the report. No results found.  ASSESSMENT & PLAN:   78 year old wonderful gentleman with history of hypertension, borderline diabetes, dyslipidemia, GERD with recent STEMI on 03/21/2020 and status post four-vessel CABG on 03/25/2020.  1) Left-sided primary testicular large B-cell lymphoma.- currently in remission S/p ipsilateral orchiectomy S/p 6 cycles of R-CEOP and 3 dose of IT MTX (for CNS prophylaxis) and R Tto contralateral testis. BCL-2, BCL 6, c-Myc negative.  2) history  of acute myocardial infarction status post CABG x4 (06/26/2020) 3) nonischemic and ischemic cardiomyopathy ejection fraction 25 to 30% on last echo. 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 CBC shows slightly low hemoglobin of 11.4 g/dL with hematocrit of 16.1%. CMP shows elevated creatinine of 1.51 and slightly low AST level of 11.  -Pt will get repeat PET scan and Brain MRI in 2-3 weeks. Pt agrees.  -Discussed with the patient not to drive right now.  -Schedule patient for PET scan and Brain MRI in 2-3 weeks. RTC with Dr. Candise Che with labs in one month.  -Answered all of patient's questions.  -Patient has no clinical or lab evidence of recurrence/progression of his lymphoma. -No indication for additional treatment of the patient's lymphoma at this time.  FOLLOW-UP: Pet/ct In 3 weeks MRI brain in 3 weeks RTC with Dr Candise Che with labs in 4 weeks  The total time spent in the appointment was 32 minutes* .  All of the patient's questions were answered with apparent satisfaction. The patient knows to call the clinic with any problems, questions or concerns.   Wyvonnia Lora MD MS AAHIVMS [facility] Hematology/Oncology Physician [facility]  .*Total Encounter Time as defined by the Centers for Medicare and Medicaid Services includes, in addition to the face-to-face time of a patient visit (documented in the note above) non-face-to-face time: obtaining and reviewing outside history, ordering and reviewing medications, tests or procedures, care coordination (communications with other health care professionals or caregivers) and documentation in the medical record.   I,Param Shah,acting as a Neurosurgeon for Wyvonnia Lora, MD.,have documented all relevant documentation on the  behalf of Wyvonnia Lora, MD,as directed by  Wyvonnia Lora, MD while in the presence of Wyvonnia Lora, MD.   .I have reviewed the above documentation for accuracy and completeness, and I agree with the above. .sacroiliac

## 2023-02-15 ENCOUNTER — Telehealth: Payer: Self-pay | Admitting: Hematology

## 2023-02-15 NOTE — Telephone Encounter (Signed)
Spoke with patient confirming upcoming appointment  

## 2023-02-17 DIAGNOSIS — E1122 Type 2 diabetes mellitus with diabetic chronic kidney disease: Secondary | ICD-10-CM | POA: Diagnosis not present

## 2023-02-17 DIAGNOSIS — I428 Other cardiomyopathies: Secondary | ICD-10-CM | POA: Diagnosis not present

## 2023-02-17 DIAGNOSIS — M19031 Primary osteoarthritis, right wrist: Secondary | ICD-10-CM | POA: Diagnosis not present

## 2023-02-17 DIAGNOSIS — I2510 Atherosclerotic heart disease of native coronary artery without angina pectoris: Secondary | ICD-10-CM | POA: Diagnosis not present

## 2023-02-17 DIAGNOSIS — N1832 Chronic kidney disease, stage 3b: Secondary | ICD-10-CM | POA: Diagnosis not present

## 2023-02-17 DIAGNOSIS — I130 Hypertensive heart and chronic kidney disease with heart failure and stage 1 through stage 4 chronic kidney disease, or unspecified chronic kidney disease: Secondary | ICD-10-CM | POA: Diagnosis not present

## 2023-02-17 DIAGNOSIS — C8339 Primary central nervous system lymphoma: Secondary | ICD-10-CM | POA: Diagnosis not present

## 2023-02-17 DIAGNOSIS — I5022 Chronic systolic (congestive) heart failure: Secondary | ICD-10-CM | POA: Diagnosis not present

## 2023-02-17 DIAGNOSIS — I493 Ventricular premature depolarization: Secondary | ICD-10-CM | POA: Diagnosis not present

## 2023-02-20 ENCOUNTER — Encounter: Payer: Self-pay | Admitting: Hematology

## 2023-02-21 DIAGNOSIS — I130 Hypertensive heart and chronic kidney disease with heart failure and stage 1 through stage 4 chronic kidney disease, or unspecified chronic kidney disease: Secondary | ICD-10-CM | POA: Diagnosis not present

## 2023-02-21 DIAGNOSIS — I428 Other cardiomyopathies: Secondary | ICD-10-CM | POA: Diagnosis not present

## 2023-02-21 DIAGNOSIS — E1122 Type 2 diabetes mellitus with diabetic chronic kidney disease: Secondary | ICD-10-CM | POA: Diagnosis not present

## 2023-02-21 DIAGNOSIS — I2510 Atherosclerotic heart disease of native coronary artery without angina pectoris: Secondary | ICD-10-CM | POA: Diagnosis not present

## 2023-02-21 DIAGNOSIS — N1832 Chronic kidney disease, stage 3b: Secondary | ICD-10-CM | POA: Diagnosis not present

## 2023-02-21 DIAGNOSIS — I493 Ventricular premature depolarization: Secondary | ICD-10-CM | POA: Diagnosis not present

## 2023-02-21 DIAGNOSIS — M19031 Primary osteoarthritis, right wrist: Secondary | ICD-10-CM | POA: Diagnosis not present

## 2023-02-21 DIAGNOSIS — C8339 Primary central nervous system lymphoma: Secondary | ICD-10-CM | POA: Diagnosis not present

## 2023-02-21 DIAGNOSIS — I5022 Chronic systolic (congestive) heart failure: Secondary | ICD-10-CM | POA: Diagnosis not present

## 2023-03-06 ENCOUNTER — Encounter (HOSPITAL_COMMUNITY)
Admission: RE | Admit: 2023-03-06 | Discharge: 2023-03-06 | Disposition: A | Discharge status: Home / Self Care | Payer: Medicare HMO | Source: Ambulatory Visit | Attending: Hematology | Admitting: Hematology

## 2023-03-06 DIAGNOSIS — C8339 Primary central nervous system lymphoma: Secondary | ICD-10-CM | POA: Diagnosis not present

## 2023-03-06 DIAGNOSIS — C8330 Diffuse large B-cell lymphoma, unspecified site: Secondary | ICD-10-CM | POA: Diagnosis not present

## 2023-03-06 LAB — GLUCOSE, CAPILLARY: Glucose-Capillary: 147 mg/dL — ABNORMAL HIGH (ref 70–99)

## 2023-03-06 MED ORDER — FLUDEOXYGLUCOSE F - 18 (FDG) INJECTION
9.5000 | Freq: Once | INTRAVENOUS | Status: AC | PRN
Start: 2023-03-06 — End: 2023-03-06
  Administered 2023-03-06: 9.22 via INTRAVENOUS

## 2023-03-07 ENCOUNTER — Ambulatory Visit (HOSPITAL_COMMUNITY)
Admission: RE | Admit: 2023-03-07 | Discharge: 2023-03-07 | Disposition: A | Discharge status: Home / Self Care | Payer: Medicare HMO | Source: Ambulatory Visit | Attending: Hematology | Admitting: Hematology

## 2023-03-07 DIAGNOSIS — C8330 Diffuse large B-cell lymphoma, unspecified site: Secondary | ICD-10-CM | POA: Diagnosis not present

## 2023-03-07 DIAGNOSIS — G939 Disorder of brain, unspecified: Secondary | ICD-10-CM | POA: Diagnosis not present

## 2023-03-07 DIAGNOSIS — G319 Degenerative disease of nervous system, unspecified: Secondary | ICD-10-CM | POA: Diagnosis not present

## 2023-03-07 DIAGNOSIS — Z8572 Personal history of non-Hodgkin lymphomas: Secondary | ICD-10-CM | POA: Diagnosis not present

## 2023-03-07 MED ORDER — GADOBUTROL 1 MMOL/ML IV SOLN
8.0000 mL | Freq: Once | INTRAVENOUS | Status: AC | PRN
  Administered 2023-03-07: 8 mL via INTRAVENOUS

## 2023-03-16 ENCOUNTER — Other Ambulatory Visit: Payer: Self-pay

## 2023-03-16 DIAGNOSIS — C8330 Diffuse large B-cell lymphoma, unspecified site: Secondary | ICD-10-CM

## 2023-03-17 ENCOUNTER — Inpatient Hospital Stay: Payer: Medicare HMO | Admitting: Hematology

## 2023-03-17 ENCOUNTER — Inpatient Hospital Stay: Payer: Medicare HMO | Attending: Hematology

## 2023-03-17 VITALS — BP 138/87 | HR 88 | Temp 97.3°F | Resp 16 | Ht 71.5 in | Wt 188.7 lb

## 2023-03-17 DIAGNOSIS — C83398 Diffuse large b-cell lymphoma of other extranodal and solid organ sites: Secondary | ICD-10-CM | POA: Insufficient documentation

## 2023-03-17 DIAGNOSIS — E785 Hyperlipidemia, unspecified: Secondary | ICD-10-CM | POA: Diagnosis not present

## 2023-03-17 DIAGNOSIS — C8330 Diffuse large B-cell lymphoma, unspecified site: Secondary | ICD-10-CM | POA: Diagnosis not present

## 2023-03-17 DIAGNOSIS — K219 Gastro-esophageal reflux disease without esophagitis: Secondary | ICD-10-CM | POA: Diagnosis not present

## 2023-03-17 DIAGNOSIS — E119 Type 2 diabetes mellitus without complications: Secondary | ICD-10-CM | POA: Diagnosis not present

## 2023-03-17 DIAGNOSIS — Z85828 Personal history of other malignant neoplasm of skin: Secondary | ICD-10-CM | POA: Diagnosis not present

## 2023-03-17 DIAGNOSIS — I252 Old myocardial infarction: Secondary | ICD-10-CM | POA: Insufficient documentation

## 2023-03-17 DIAGNOSIS — Z5111 Encounter for antineoplastic chemotherapy: Secondary | ICD-10-CM

## 2023-03-17 DIAGNOSIS — Z87891 Personal history of nicotine dependence: Secondary | ICD-10-CM | POA: Diagnosis not present

## 2023-03-17 DIAGNOSIS — I10 Essential (primary) hypertension: Secondary | ICD-10-CM | POA: Insufficient documentation

## 2023-03-17 LAB — CBC WITH DIFFERENTIAL (CANCER CENTER ONLY)
Abs Immature Granulocytes: 0.02 10*3/uL (ref 0.00–0.07)
Basophils Absolute: 0.1 10*3/uL (ref 0.0–0.1)
Basophils Relative: 1 %
Eosinophils Absolute: 0.2 10*3/uL (ref 0.0–0.5)
Eosinophils Relative: 2 %
HCT: 36.3 % — ABNORMAL LOW (ref 39.0–52.0)
Hemoglobin: 11.5 g/dL — ABNORMAL LOW (ref 13.0–17.0)
Immature Granulocytes: 0 %
Lymphocytes Relative: 13 %
Lymphs Abs: 1.1 10*3/uL (ref 0.7–4.0)
MCH: 29.2 pg (ref 26.0–34.0)
MCHC: 31.7 g/dL (ref 30.0–36.0)
MCV: 92.1 fL (ref 80.0–100.0)
Monocytes Absolute: 0.6 10*3/uL (ref 0.1–1.0)
Monocytes Relative: 7 %
Neutro Abs: 6.6 10*3/uL (ref 1.7–7.7)
Neutrophils Relative %: 77 %
Platelet Count: 284 10*3/uL (ref 150–400)
RBC: 3.94 MIL/uL — ABNORMAL LOW (ref 4.22–5.81)
RDW: 13.7 % (ref 11.5–15.5)
WBC Count: 8.6 10*3/uL (ref 4.0–10.5)
nRBC: 0 % (ref 0.0–0.2)

## 2023-03-17 LAB — CMP (CANCER CENTER ONLY)
ALT: 8 U/L (ref 0–44)
AST: 13 U/L — ABNORMAL LOW (ref 15–41)
Albumin: 4 g/dL (ref 3.5–5.0)
Alkaline Phosphatase: 61 U/L (ref 38–126)
Anion gap: 7 (ref 5–15)
BUN: 19 mg/dL (ref 8–23)
CO2: 25 mmol/L (ref 22–32)
Calcium: 9.8 mg/dL (ref 8.9–10.3)
Chloride: 107 mmol/L (ref 98–111)
Creatinine: 1.49 mg/dL — ABNORMAL HIGH (ref 0.61–1.24)
GFR, Estimated: 47 mL/min — ABNORMAL LOW (ref >60)
Glucose, Bld: 127 mg/dL — ABNORMAL HIGH (ref 70–99)
Potassium: 4.2 mmol/L (ref 3.5–5.1)
Sodium: 139 mmol/L (ref 135–145)
Total Bilirubin: 0.6 mg/dL (ref 0.0–1.2)
Total Protein: 6.6 g/dL (ref 6.5–8.1)

## 2023-03-17 LAB — LACTATE DEHYDROGENASE: LDH: 139 U/L (ref 98–192)

## 2023-03-17 NOTE — Progress Notes (Signed)
 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  Patient was last seen by me on 02/14/2023 and reported mild dizziness when standing, 3 falls in 24 hours causing mild bruising, occasional mild headache, and occasional mild cloudy vision in his left eye.  Today, he reports that he has been feeling fairly well overall. However, he does complain of weakness on some days.   He complains of memory issues starting two weeks ago, including issues with remembering names.   His wife reports that patient did endorse right leg shaking when standing this morning. He denies any new neurological symptoms such as weakness or numbness in the hands or feet otherwise.   Patient reports that he has not driven since December 2024.   He reports feeling cold frequently overall and also particularly in his hands.   He reports that it sometimes takes him longer to stand. Patient reports that he is able to bend over mildly when moving gradually. He reports that he has graduated physical therapy.   He reports having a sharp pain in the groin occasionally, which has occurred in the right and left sides previously. His pain is not debilitating, but is bothersome.   Patient is no longer on steroids or insulin. His blood sugars have been fairly steady at home and generally range 132-162 at home. He takes Jardiance regularly and is not on Metformin.   He reports two episodes in last 1-2 months in which his blood sugar levels have been over 190.   Patient reports a blood glucose level of 225 on one occasion, which had normalized later that day.  Patient denies any fever, chills, night sweats, infection issues, abdominal pain, or change in bowel habits.  He generally aims to walk 30-45 minutes with warmer outdoor temperatures.   MEDICAL HISTORY:  Past Medical History:  Diagnosis Date   Allergic rhinitis    Arthritis    wrists   Cardiomyopathy, nonischemic (HCC)    followed by cardiology--- dr Delton See---  2016 ef 45-50% ,  2016 nuclear ef 37%,  2017 per echo ef 50-55%   CHF (congestive heart failure), NYHA class III (HCC) 03/21/2020   Coronary artery disease    GERD (gastroesophageal reflux disease)    Hiatal hernia    History of kidney stones    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;   01/ 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   History of syncope (03-06-2020 pt stated has not had sycopal episode in few years, stated it seems to happen in extreme hot conditions)   cardiologist--- dr Eloy End--- dx recurrent syncope;  nuclear study 11-03-2014 intermediate risk w/ no ischemia, apical hypokinesis, nuclear ef 37%;  event monitor-- 12-21-2015 SB/ ST  no pauses/ arrythmia's;  echo 08-03-2015 ef 50-55%   Hyperlipidemia    Hypertension    followed by pcp   Hypovitaminosis D    Mass of left testicle    Nocturia    Plantar fasciitis    Presence of surgical incision  01/ 2022  moh's w/ skin graft of nose, per pt still healing and wear bandage daily   Type 2 diabetes mellitus (HCC)    pt is adament that he is not and have been told he is a diabetic but a borderline;  followed by pcp, in pcp note states DM2 and takes 2 meds daily   Wears glasses    Wears hearing aid in both ears    Weight loss 11/16/2020    SURGICAL HISTORY: Past Surgical History:  Procedure Laterality Date   COLONOSCOPY  last one 01-30-2017   CORONARY ARTERY BYPASS GRAFT N/A 03/25/2020   Procedure: CORONARY ARTERY BYPASS  GRAFTING (CABG), ON PUMP, TIMES FOUR, USING BILATERAL INTERNAL MAMMARY ARTERIES AND LEFT RADIAL ARTERY;  Surgeon: Linden Dolin, MD;  Location: [facility] OR;  Service: Open Heart Surgery;  Laterality: N/A;   ICD IMPLANT N/A 08/23/2021   Procedure: ICD IMPLANT;  Surgeon: Marinus Maw, MD;  Location: [facility] INVASIVE CV LAB;  Service: Cardiovascular;  Laterality: N/A;   IR IMAGING GUIDED PORT INSERTION  07/06/2020   LOW ANTERIOR RESECTION RECTUM W/ COLOPROCTOSTOMY  05/2003   MOHS SURGERY  01/2020   nose w/ graft   ORCHIECTOMY Left 03/11/2020   Procedure: Clearance Coots;  Surgeon: Rene Paci, MD;  Location: [facility];  Service: Urology;  Laterality: Left;  ONLY NEEDS 60 MIN   PORTA CATH REMOVAL N/A 08/23/2021   Procedure: PORTA CATH REMOVAL;  Surgeon: Marinus Maw, MD;  Location: [facility] INVASIVE CV LAB;  Service: Cardiovascular;  Laterality: N/A;   RADIAL ARTERY HARVEST Left 03/25/2020   Procedure: LEFT RADIAL ARTERY HARVEST;  Surgeon: Linden Dolin, MD;  Location: [facility] OR;  Service: Open Heart Surgery;  Laterality: Left;   RIGHT/LEFT HEART CATH AND CORONARY ANGIOGRAPHY N/A 03/23/2020   Procedure: RIGHT/LEFT HEART CATH AND CORONARY ANGIOGRAPHY;  Surgeon: Corky Crafts, MD;  Location: [facility] INVASIVE CV LAB;  Service: Cardiovascular;  Laterality: N/A;   SHOULDER SURGERY Right 1992; 07/ 2021   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 Left 12/24/2008   left ear and nose    TEE WITHOUT CARDIOVERSION N/A 03/25/2020   Procedure: TRANSESOPHAGEAL ECHOCARDIOGRAM (TEE);  Surgeon: Linden Dolin, MD;  Location: [facility] OR;  Service: Open Heart Surgery;  Laterality: N/A;   TONSILLECTOMY AND ADENOIDECTOMY  child   UPPER GASTROINTESTINAL ENDOSCOPY  last one 06-06-2017    SOCIAL HISTORY: Social History   Socioeconomic History   Marital status: Married    Spouse name: Not on file   Number of children: Not on file   Years of education: Not on file   Highest education  level: Not on file  Occupational History   Not on file  Tobacco Use   Smoking status: Former    Types: Pipe    Quit date: 11/29/1976    Years since quitting: 46.3   Smokeless tobacco: Never  Vaping Use   Vaping status: Never Used  Substance and Sexual Activity   Alcohol use: Yes    Alcohol/week: 0.0 standard drinks of alcohol    Comment: Occasional drink    Drug use: Never   Sexual activity: Not on file  Other Topics Concern   Not on file  Social History Narrative   Not on file   Social Drivers of Health   Financial Resource Strain: Not on file  Food Insecurity: No Food Insecurity (12/06/2022)   Hunger Vital Sign    Worried About Running Out of Food in  the Last Year: Never true    Ran Out of Food in the Last Year: Never true  Transportation Needs: No Transportation Needs (12/06/2022)   PRAPARE - Administrator, Civil Service (Medical): No    Lack of Transportation (Non-Medical): No  Physical Activity: Not on file  Stress: Not on file  Social Connections: Not on file  Intimate Partner Violence: Not At Risk (12/06/2022)   Humiliation, Afraid, Rape, and Kick questionnaire    Fear of Current or Ex-Partner: No    Emotionally Abused: No    Physically Abused: No    Sexually Abused: No    FAMILY HISTORY: Family History  Problem Relation Age of Onset   Heart attack Mother    Stroke Father    Colon cancer Neg Hx    Esophageal cancer Neg Hx    Pancreatic cancer Neg Hx    Rectal cancer Neg Hx    Stomach cancer Neg Hx    Colon polyps Neg Hx     ALLERGIES:  has no known allergies.  MEDICATIONS:  Current Outpatient Medications  Medication Sig Dispense Refill   acetaminophen (TYLENOL) 325 MG tablet Take 2 tablets (650 mg total) by mouth every 6 (six) hours as needed for mild pain (pain score 1-3) (or Fever >/= 101).     aspirin EC 81 MG tablet Take 81 mg by mouth daily. Swallow whole.     B-Complex TABS Take 1 tablet by mouth daily.     Cholecalciferol  (VITAMIN D) 50 MCG (2000 UT) CAPS Take 2,000 Units by mouth daily. Monday-Friday     dexamethasone (DECADRON) 4 MG tablet Take 1 tablet (4 mg total) by mouth 2 (two) times daily. (Patient taking differently: Take 4 mg by mouth 2 (two) times daily. Take 1/2 Tablet Twice Daily For 2 weeks. Then 1/2 Tablet Daily For 2 weeks) 16 tablet 0   empagliflozin (JARDIANCE) 10 MG TABS tablet TAKE 1 TABLET BY MOUTH DAILY BEFORE BREAKFAST. 90 tablet 3   feeding supplement (ENSURE ENLIVE / ENSURE PLUS) LIQD Take 237 mLs by mouth 2 (two) times daily between meals. 237 mL 12   glucosamine-chondroitin 500-400 MG tablet Take 1 tablet by mouth See admin instructions. 5 times weekly     insulin aspart (NOVOLOG) 100 UNIT/ML injection Inject 5 Units into the skin 3 (three) times daily with meals. 10 mL 11   insulin glargine-yfgn (SEMGLEE) 100 UNIT/ML injection Inject 0.22 mLs (22 Units total) into the skin daily. 10 mL 11   metoprolol succinate (TOPROL-XL) 50 MG 24 hr tablet Take 1 tablet (50 mg total) by mouth daily. Take with or immediately following a meal. 30 tablet 2   Multiple Vitamin (MULTIVITAMIN WITH MINERALS) TABS tablet Take 1 tablet by mouth See admin instructions. 5 times weekly     omeprazole (PRILOSEC) 20 MG capsule Take 20 mg by mouth daily.     ondansetron (ZOFRAN) 4 MG tablet Take 1 tablet (4 mg total) by mouth every 6 (six) hours as needed for nausea. 20 tablet 0   polyethylene glycol (MIRALAX / GLYCOLAX) 17 g packet Take 17 g by mouth daily. 14 each 0   spironolactone (ALDACTONE) 25 MG tablet Take 0.5 tablets (12.5 mg total) by mouth daily. 30 tablet 2   vitamin B-12 (CYANOCOBALAMIN) 1000 MCG tablet Take 1,000 mcg by mouth See admin instructions. 5 times a week     No current facility-administered medications for this visit.    REVIEW OF SYSTEMS:    10  Point review of Systems was done is negative except as noted above.   PHYSICAL EXAMINATION: ECOG PERFORMANCE STATUS: 2 - Symptomatic, <50% confined  to bed  .BP 138/87 (BP Location: Left Arm, Patient Position: Sitting)   Pulse 88   Temp (!) 97.3 F (36.3 C) (Temporal)   Resp 16   Ht 5' 11.5" (1.816 m)   Wt 188 lb 11.2 oz (85.6 kg)   SpO2 100%   BMI 25.95 kg/m  GENERAL:alert, in no acute distress and comfortable SKIN: no acute rashes, no significant lesions EYES: conjunctiva are pink and non-injected, sclera anicteric OROPHARYNX: MMM, no exudates, no oropharyngeal erythema or ulceration NECK: supple, no JVD LYMPH:  no palpable lymphadenopathy in the cervical, axillary or inguinal regions LUNGS: clear to auscultation b/l with normal respiratory effort HEART: regular rate & rhythm ABDOMEN:  normoactive bowel sounds , non tender, not distended. Extremity: no pedal edema PSYCH: alert & oriented x 3 with fluent speech NEURO: no focal motor/sensory deficits   LABORATORY DATA:  I have reviewed the data as listed      Latest Ref Rng & Units 03/17/2023    2:31 PM 02/14/2023    1:30 PM 12/26/2022    4:34 PM  CBC  WBC 4.0 - 10.5 K/uL 8.6  10.3  15.8   Hemoglobin 13.0 - 17.0 g/dL 95.2  84.1  32.4   Hematocrit 39.0 - 52.0 % 36.3  35.9  34.8   Platelets 150 - 400 K/uL 284  347  200     .    Latest Ref Rng & Units 03/17/2023    2:31 PM 02/14/2023    1:30 PM 12/26/2022    4:34 PM  CMP  Glucose 70 - 99 mg/dL 401  027  253   BUN 8 - 23 mg/dL 19  14  44   Creatinine 0.61 - 1.24 mg/dL 6.64  4.03  4.74   Sodium 135 - 145 mmol/L 139  142  132   Potassium 3.5 - 5.1 mmol/L 4.2  4.4  4.2   Chloride 98 - 111 mmol/L 107  110  111   CO2 22 - 32 mmol/L 25  24  18    Calcium 8.9 - 10.3 mg/dL 9.8  9.7  8.4   Total Protein 6.5 - 8.1 g/dL 6.6  6.7  5.7   Total Bilirubin 0.0 - 1.2 mg/dL 0.6  0.7  0.9   Alkaline Phos 38 - 126 U/L 61  71  64   AST 15 - 41 U/L 13  11  60   ALT 0 - 44 U/L 8  9  355    . Lab Results  Component Value Date   LDH 139 03/17/2023   SURGICAL PATHOLOGY   THIS IS AN ADDENDUM REPORT   CASE: [id number]  PATIENT:  Roy [name]  Surgical Pathology Report  Addendum    Reason for Addendum #1:  Molecular Genetic Test Results, FISH   Clinical History: Left testicular mass (crm)      FINAL MICROSCOPIC DIAGNOSIS:   A. TESTICLE, LEFT, ORCHIECTOMY:  -  Diffuse aggressive large B-cell lymphoma  -  See comment    COMMENT:   Sections of testicle show architectural effacement by sheets of large  lymphoid cells with vesicular nuclei and pale cytoplasm.  There is  admixed apoptotic debris and increased mitotic activity.  By  immunohistochemistry, the large lymphoid cells are positive for CD20,  CD5 (dim), BCL6 (dim), MUM1, and BCL2.  They are negative for CD10,  CD30  (<1%), cyclin D1, and TdT.  The Ki67 proliferation index is up to  approximately 70%.  CD3 highlights small T-cells in the background. EBV  is negative by in situ hybridization.   Together, the findings support the diagnosis of a diffuse aggressive  large B-cell lymphoma. The differential diagnosis includes a diffuse  large B-cell lymphoma, NOS, with activated B-cell subtype by the [facility]  algorithm and high-grade B-cell lymphoma with MYC and BCL2 or BCL6  rearrangements.  FISH for BCL2, BCL6, and MYC rearrangements will be  performed. Correlation with clinical and radiographic findings is  recommended for consideration of a primary testicular lymphoma.   Result reported to L. Gibson on 03/16/20 at 1720 by S. O'Neill.    ADDENDUM:   FISH RESULTS:   Results: NORMAL   Interpretation:   BCL6 rearrangement:      Not Detected  MYC rearrangement:  Not Detected  MYC amplification:  Not Detected  BCL6 rearrangement:      Not Detected   Please note this testing was performed and interpreted by an outside  facility ([facility]).  This addendum is only being added to provide a  summary of the results for report completeness.  Please see electronic  medical record for a copy of the full report.   RADIOGRAPHIC STUDIES: I have  personally reviewed the radiological images as listed and agreed with the findings in the report. MR Brain W Wo Contrast Result Date: 03/17/2023 CLINICAL DATA:  Provided history: Diffuse large B-cell lymphoma, unspecified body region. Hematologic malignancy, assess treatment response. Additional history obtained from electronic MEDICAL RECORD NUMBERHistory of whole brain radiation therapy. EXAM: MRI HEAD WITHOUT AND WITH CONTRAST TECHNIQUE: Multiplanar, multiecho pulse sequences of the brain and surrounding structures were obtained without and with intravenous contrast. CONTRAST:  8mL GADAVIST GADOBUTROL 1 MMOL/ML IV SOLN COMPARISON:  Brain MRI 12/08/2022. FINDINGS: Brain: Mild generalized cerebral atrophy. Pre-contrast T1 hyperintense cortical laminar necrosis and/or non-acute hemorrhage at the periphery of a treated lesion within the anterior right frontal lobe. Minimal, if any, residual pathologic enhancement at this site (subtle residual enhancement is questioned at the superior aspect of the lesion on series 10, image 40). Surrounding T2 FLAIR hyperintense signal abnormality within the anterior right frontal lobe persists, but has significantly decreased and this may reflect edema and/or treatment-related gliosis. Additionally, some cortical encephalomalacia has developed at this site No residual pathologic enhancement at site of the treated lesion within the right superior frontal gyrus. Mild residual T2 FLAIR hyperintense signal abnormality at this site, which may reflect edema and/or post-treatment gliosis. No residual pathologic enhancement at site of a treated lesion within the posterior limb of right internal capsule. Persistent although decreased T2 FLAIR hyperintense signal abnormality at this site, which may reflect edema and/or post-treatment gliosis. No new enhancing lesion is identified. Multifocal T2 FLAIR hyperintense signal abnormality elsewhere within the cerebral white matter and pons, nonspecific  but likely reflecting a combination of chronic small ischemic disease and treatment related changes. No extra-axial fluid collection. No midline shift. Vascular: Maintained flow voids within the proximal large arterial vessels. Skull and upper cervical spine: No focal worrisome marrow lesion. Sinuses/Orbits: No mass or acute finding within the imaged orbits. No significant paranasal sinus disease. Other: Fluid within bilateral mastoid air cells (small volume right, trace left). IMPRESSION: 1. Interval response to therapy at sites of previously treated lesions in the right superior frontal gyrus, anterior right frontal lobe and posterior limb of right internal capsule. Possible minimal residual enhancement at  the anterior right frontal lobe treatment site. Persistent although decreased FLAIR hyperintense signal abnormality associated with the treated lesions, which may reflect edema and/or post-treatment gliosis. 2. No new enhancing intracranial lesion identified. Electronically Signed   By: Jackey Loge D.O.   On: 03/17/2023 15:58   NM PET Image Restag (PS) Skull Base To Thigh Result Date: 03/17/2023 CLINICAL DATA:  Subsequent treatment strategy for lymphoma. CNS lymphoma EXAM: NUCLEAR MEDICINE PET SKULL BASE TO THIGH TECHNIQUE: 9.22 mCi F-18 FDG was injected intravenously. Full-ring PET imaging was performed from the skull base to thigh after the radiotracer. CT data was obtained and used for attenuation correction and anatomic localization. Fasting blood glucose: 147 mg/dl COMPARISON:  None Available. FINDINGS: Mediastinal blood pool activity: SUV max 2.6 Liver activity: SUV max 3.3 NECK: No hypermetabolic lymph nodes in the neck. Incidental CT findings: None. CHEST: No hypermetabolic mediastinal or hilar nodes. No suspicious pulmonary nodules on the CT scan. Incidental CT findings: Post CABG.  LEFT-sided pacer noted ABDOMEN/PELVIS: No abnormal hypermetabolic activity within the liver, pancreas, adrenal glands,  or spleen. No hypermetabolic lymph nodes in the abdomen or pelvis. Incidental CT findings: Small gallstones. Atherosclerotic calcification of the aorta. SKELETON: No focal hypermetabolic activity to suggest skeletal metastasis. Incidental CT findings: None. IMPRESSION: 1. No evidence of lymphoma recurrence or metastasis. 2. [place] 1. 3.  Aortic Atherosclerosis (ICD10-I70.0). Electronically Signed   By: Genevive Bi M.D.   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  1) Left-sided primary testicular large B-cell lymphoma.- currently in remission S/p ipsilateral orchiectomy S/p 6 cycles of R-CEOP and 3 dose of IT MTX (for CNS prophylaxis) and R Tto contralateral testis. BCL-2, BCL 6, c-Myc negative.  2) history of acute myocardial infarction status post CABG x4 (06/26/2020) 3) nonischemic and ischemic cardiomyopathy ejection fraction 25 to 30% on last echo. 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 CBC stable,showed WBC of 8.6K, hemoglobin of 11.5, and platelets of 284K. -CMP stable -LDH WNL -03/06/2023 PET scan official reading is pending at this time. There does not appear to be any obvious findings systemically on his PET scan. Addendum -- results read after the clinical visit shows no evidence of systemic lymphoma recurrence -MRI brain official reading is pending at time of clinical visit -patient was noted to have had recurrence in the brain despite intrathecal methotrexate -his previous Brain MRI 12/08/2022 showed "Dominant contrast-enhancing mass in the right frontal lobe measuring 3.9 x 2.1 x 4.7 cm  with additional smaller contrast-enhancing lesions in the high right frontal lobe and near the posterior limb of the internal capsule on the right. 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 Discussed that there is some uncertainty in regards to whether he has the same reflexes as before he had a tumor in the brain -discussed that his ability to drive would be dependent on factors including neurological processes and his response time, though gray-zone territory is possible. Discussed that there would be a role for  evaluation by neurologist -educated patient that memory can fluctuate based on multiple factors, including radiation therapy to some degree and weather -answered all of patient's questions in detail  Addendum MRI brain -- read after clinic visit -- 1. Interval response to therapy at sites of previously treated lesions in the right superior frontal gyrus, anterior right frontal lobe and posterior limb of right internal capsule. Possible minimal residual enhancement at the anterior right frontal lobe treatment site. Persistent although decreased FLAIR hyperintense signal abnormality associated with the treated lesions, which may reflect edema and/or post-treatment gliosis. 2. No new enhancing intracranial lesion identified.   FOLLOW-UP: MRI brain in 5 weeks RTC with Dr Candise Che with labs in 6 weeks  The total time spent in the appointment was 32 minutes* .  All of the patient's questions were answered with apparent satisfaction. The patient knows to call the clinic with any problems, questions or concerns.   Wyvonnia Lora MD MS AAHIVMS [facility] Hematology/Oncology Physician [facility]  .*Total Encounter Time as defined by the Centers for Medicare and Medicaid Services includes, in  addition to the face-to-face time of a patient visit (documented in the note above) non-face-to-face time: obtaining and reviewing outside history, ordering and reviewing medications, tests or procedures, care coordination (communications with other health care professionals or caregivers) and documentation in the medical record.    I,Mitra Faeizi,acting as a Neurosurgeon for Wyvonnia Lora, MD.,have documented all relevant documentation on the behalf of Wyvonnia Lora, MD,as directed by  Wyvonnia Lora, MD while in the presence of Wyvonnia Lora, MD.  .I have reviewed the above documentation for accuracy and completeness, and I agree with the above. Johney Maine MD

## 2023-03-25 ENCOUNTER — Encounter: Payer: Self-pay | Admitting: Hematology

## 2023-03-27 NOTE — Progress Notes (Signed)
 Contacted pt per Dr Candise Che to let patient know pet/ct with no evidence of lymphoma  MRI brain with 1 spot that is indeterminate but likely to be related to radiation change-- would recommend rpt MRI brain in 5 weeks and see him back in 6 weeks.  Pt acknowledged information and verbalized understanding

## 2023-04-11 ENCOUNTER — Telehealth: Payer: Self-pay

## 2023-04-11 NOTE — Telephone Encounter (Signed)
 Pt called in stating that when he touched it he felt a buzz for like 5-6 seconds and is concerned. It only happened once and would like a call back

## 2023-04-11 NOTE — Telephone Encounter (Signed)
 Spoke to [employer], [employer]. Who advised their ICD's do not vibrate, buzz or make noise. Patient called and was appreciative of call back.

## 2023-04-18 ENCOUNTER — Encounter (HOSPITAL_COMMUNITY): Payer: Self-pay

## 2023-04-27 ENCOUNTER — Ambulatory Visit (HOSPITAL_COMMUNITY)

## 2023-05-12 ENCOUNTER — Ambulatory Visit: Payer: Medicare HMO | Admitting: Hematology

## 2023-05-12 ENCOUNTER — Other Ambulatory Visit: Payer: Medicare HMO

## 2023-05-17 NOTE — CV Procedure (Signed)
  Device system confirmed to be MRI conditional, with implant date > 6 weeks ago, and no evidence of abandoned or epicardial leads in review of most recent CXR  Device last cleared by EP Provider: Valeri Gate 05/17/23  Clearance is good through for 1 year as long as parameters remain stable at time of check. If pt undergoes a cardiac device procedure during that time, they should be re-cleared.   Tachy-therapies to be programmed off if applicable with device back to pre-MRI settings after completion of exam.  [employer] was available remotely to assist in programming recommendations.   Arlys Lamer, RT  05/17/2023 2:16 PM

## 2023-05-18 DIAGNOSIS — I129 Hypertensive chronic kidney disease with stage 1 through stage 4 chronic kidney disease, or unspecified chronic kidney disease: Secondary | ICD-10-CM | POA: Diagnosis not present

## 2023-05-18 DIAGNOSIS — E1159 Type 2 diabetes mellitus with other circulatory complications: Secondary | ICD-10-CM | POA: Diagnosis not present

## 2023-05-18 DIAGNOSIS — Z951 Presence of aortocoronary bypass graft: Secondary | ICD-10-CM | POA: Diagnosis not present

## 2023-05-23 ENCOUNTER — Other Ambulatory Visit

## 2023-05-23 ENCOUNTER — Ambulatory Visit: Admitting: Hematology

## 2023-05-25 ENCOUNTER — Encounter (HOSPITAL_COMMUNITY)
Admission: RE | Admit: 2023-05-25 | Discharge: 2023-05-25 | Disposition: A | Discharge status: Home / Self Care | Source: Ambulatory Visit | Attending: Hematology | Admitting: Hematology

## 2023-05-25 DIAGNOSIS — N1832 Chronic kidney disease, stage 3b: Secondary | ICD-10-CM | POA: Diagnosis not present

## 2023-05-25 DIAGNOSIS — C799 Secondary malignant neoplasm of unspecified site: Secondary | ICD-10-CM | POA: Diagnosis not present

## 2023-05-25 DIAGNOSIS — E1159 Type 2 diabetes mellitus with other circulatory complications: Secondary | ICD-10-CM | POA: Diagnosis not present

## 2023-05-25 DIAGNOSIS — Z951 Presence of aortocoronary bypass graft: Secondary | ICD-10-CM | POA: Diagnosis not present

## 2023-05-25 DIAGNOSIS — C8330 Diffuse large B-cell lymphoma, unspecified site: Secondary | ICD-10-CM | POA: Diagnosis not present

## 2023-05-25 DIAGNOSIS — G9389 Other specified disorders of brain: Secondary | ICD-10-CM | POA: Diagnosis not present

## 2023-05-25 DIAGNOSIS — G238 Other specified degenerative diseases of basal ganglia: Secondary | ICD-10-CM | POA: Diagnosis not present

## 2023-05-25 DIAGNOSIS — Z0000 Encounter for general adult medical examination without abnormal findings: Secondary | ICD-10-CM | POA: Diagnosis not present

## 2023-05-25 DIAGNOSIS — I129 Hypertensive chronic kidney disease with stage 1 through stage 4 chronic kidney disease, or unspecified chronic kidney disease: Secondary | ICD-10-CM | POA: Diagnosis not present

## 2023-05-25 DIAGNOSIS — I428 Other cardiomyopathies: Secondary | ICD-10-CM | POA: Diagnosis not present

## 2023-05-25 DIAGNOSIS — E7800 Pure hypercholesterolemia, unspecified: Secondary | ICD-10-CM | POA: Diagnosis not present

## 2023-05-25 MED ORDER — GADOBUTROL 1 MMOL/ML IV SOLN
8.0000 mL | Freq: Once | INTRAVENOUS | Status: AC | PRN
  Administered 2023-05-25: 8 mL via INTRAVENOUS

## 2023-06-01 ENCOUNTER — Other Ambulatory Visit: Payer: Self-pay

## 2023-06-01 ENCOUNTER — Ambulatory Visit (INDEPENDENT_AMBULATORY_CARE_PROVIDER_SITE_OTHER): Payer: Medicare HMO

## 2023-06-01 DIAGNOSIS — I428 Other cardiomyopathies: Secondary | ICD-10-CM | POA: Diagnosis not present

## 2023-06-01 DIAGNOSIS — D0471 Carcinoma in situ of skin of right lower limb, including hip: Secondary | ICD-10-CM | POA: Diagnosis not present

## 2023-06-01 DIAGNOSIS — L821 Other seborrheic keratosis: Secondary | ICD-10-CM | POA: Diagnosis not present

## 2023-06-01 DIAGNOSIS — C44329 Squamous cell carcinoma of skin of other parts of face: Secondary | ICD-10-CM | POA: Diagnosis not present

## 2023-06-01 DIAGNOSIS — C44622 Squamous cell carcinoma of skin of right upper limb, including shoulder: Secondary | ICD-10-CM | POA: Diagnosis not present

## 2023-06-01 DIAGNOSIS — C8330 Diffuse large B-cell lymphoma, unspecified site: Secondary | ICD-10-CM

## 2023-06-01 DIAGNOSIS — Z85828 Personal history of other malignant neoplasm of skin: Secondary | ICD-10-CM | POA: Diagnosis not present

## 2023-06-01 LAB — CUP PACEART REMOTE DEVICE CHECK
Date Time Interrogation Session: 20250508084743
Implantable Lead Connection Status: 753985
Implantable Lead Implant Date: 20230731
Implantable Lead Location: 753860
Implantable Lead Model: 436909
Implantable Lead Serial Number: 81532339
Implantable Pulse Generator Implant Date: 20230731
Pulse Gen Model: 429525

## 2023-06-01 NOTE — Progress Notes (Signed)
 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  Patient was last seen by me on 03/17/2023 and complained of weakness sometimes, memory issues, an episode of right leg shaking when standing, feeling cold frequently overall and also particularly in his hands, increased time to stand, and sharp pain in the groin occasionally.   Today, he presents with a cane and is accompanied by his wife. Patient reports that he has been generally eating well and continues to complete daily activities independently. He denies any headache, change in vision/ speech, or new weakness in the hands/legs.   Patient reports short-term memory issues every once in a while.  Patient reports fluctuation in his sleeping habits due to urinary frequency at night. He has otherwise been generally sleeping well. Patient reports that he generally sleeps for 9.5-10 hours a night, and occasionally 11-14 hours.   Patient denies any new lumps/bumps, fever, chills, night sweats, abdominal pain, or leg swelling.  His wife reports that his cholesterol was noted to be high at 256 during lab work with PCP two weeks ago. She notes that his cholesterol has never been that high. He is on Atorvastatin . His protein in the urine was also noted to be high. Patient received labs with [facility], which is not in our system.   He notes that he has been drinking Ensure nutritional supplement.   His wife reports that patient generally stays cold and patient notes that  the wind has been bothersome.   Patient reports that he has noticed that his depth perception is not what it used to be 6 months ago. His wife notes that patient has issues with lifting his feet.   Patient denies noticing any sign of confusion. He notes that sometimes if he does not act on a thought immediately, he will forget it.   He denies any traveling plans for the next couple of months.   MEDICAL HISTORY:  Past Medical History:  Diagnosis Date   Allergic rhinitis    Arthritis    wrists   Cardiomyopathy, nonischemic (HCC)    followed by cardiology--- dr Nicholette Barley---  2016 ef 45-50% ,  2016 nuclear ef 37%,  2017 per echo ef 50-55%   CHF (congestive heart failure), NYHA class III (HCC) 03/21/2020   Coronary artery disease    GERD (gastroesophageal reflux disease)    Hiatal hernia    History of kidney stones    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;   01/ 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   History of syncope (03-06-2020 pt stated has not had sycopal episode in few years, stated it seems to happen in extreme hot conditions)   cardiologist--- dr Dawson Europe--- dx recurrent syncope;  nuclear study 11-03-2014 intermediate risk w/ no ischemia, apical hypokinesis, nuclear ef 37%;  event monitor-- 12-21-2015 SB/ ST  no pauses/ arrythmia's;  echo 08-03-2015 ef 50-55%   Hyperlipidemia    Hypertension    followed by pcp   Hypovitaminosis D    Mass of left testicle  Nocturia    Plantar fasciitis    Presence of surgical incision    01/ 2022  moh's w/ skin graft of nose, per pt still healing and wear bandage daily   Type 2 diabetes mellitus (HCC)    pt is adament that he is not and have been told he is a diabetic but a borderline;  followed by pcp, in pcp note states DM2 and takes 2 meds daily   Wears glasses    Wears hearing aid in both ears    Weight loss 11/16/2020    SURGICAL HISTORY: Past Surgical History:  Procedure Laterality Date    COLONOSCOPY  last one 01-30-2017   CORONARY ARTERY BYPASS GRAFT N/A 03/25/2020   Procedure: CORONARY ARTERY BYPASS GRAFTING (CABG), ON PUMP, TIMES FOUR, USING BILATERAL INTERNAL MAMMARY ARTERIES AND LEFT RADIAL ARTERY;  Surgeon: Rudine Cos, MD;  Location: [facility] OR;  Service: Open Heart Surgery;  Laterality: N/A;   ICD IMPLANT N/A 08/23/2021   Procedure: ICD IMPLANT;  Surgeon: Tammie Fall, MD;  Location: [facility] INVASIVE CV LAB;  Service: Cardiovascular;  Laterality: N/A;   IR IMAGING GUIDED PORT INSERTION  07/06/2020   LOW ANTERIOR RESECTION RECTUM W/ COLOPROCTOSTOMY  05/2003   MOHS SURGERY  01/2020   nose w/ graft   ORCHIECTOMY Left 03/11/2020   Procedure: Wess Hammed;  Surgeon: Adelbert Homans, MD;  Location: [facility];  Service: Urology;  Laterality: Left;  ONLY NEEDS 60 MIN   PORTA CATH REMOVAL N/A 08/23/2021   Procedure: PORTA CATH REMOVAL;  Surgeon: Tammie Fall, MD;  Location: [facility] INVASIVE CV LAB;  Service: Cardiovascular;  Laterality: N/A;   RADIAL ARTERY HARVEST Left 03/25/2020   Procedure: LEFT RADIAL ARTERY HARVEST;  Surgeon: Rudine Cos, MD;  Location: [facility] OR;  Service: Open Heart Surgery;  Laterality: Left;   RIGHT/LEFT HEART CATH AND CORONARY ANGIOGRAPHY N/A 03/23/2020   Procedure: RIGHT/LEFT HEART CATH AND CORONARY ANGIOGRAPHY;  Surgeon: Lucendia Rusk, MD;  Location: [facility] INVASIVE CV LAB;  Service: Cardiovascular;  Laterality: N/A;   SHOULDER SURGERY Right 1992; 07/ 2021   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 Left 12/24/2008   left ear and nose    TEE WITHOUT CARDIOVERSION N/A 03/25/2020   Procedure: TRANSESOPHAGEAL ECHOCARDIOGRAM (TEE);  Surgeon: Rudine Cos, MD;  Location: [facility] OR;  Service: Open Heart Surgery;  Laterality: N/A;   TONSILLECTOMY AND ADENOIDECTOMY  child   UPPER GASTROINTESTINAL ENDOSCOPY  last one 06-06-2017    SOCIAL HISTORY: Social History   Socioeconomic History   Marital status: Married     Spouse name: Not on file   Number of children: Not on file   Years of education: Not on file   Highest education level: Not on file  Occupational History   Not on file  Tobacco Use   Smoking status: Former    Types: Pipe    Quit date: 11/29/1976    Years since quitting: 46.5   Smokeless tobacco: Never  Vaping Use   Vaping status: Never Used  Substance and Sexual Activity   Alcohol use: Yes    Alcohol/week: 0.0 standard drinks of alcohol    Comment: Occasional drink    Drug use: Never   Sexual activity: Not on file  Other Topics Concern   Not on file  Social History Narrative   Not on file   Social Drivers of Health   Financial Resource Strain: Not on file  Food Insecurity: No Food Insecurity (  12/06/2022)   Hunger Vital Sign    Worried About Running Out of Food in the Last Year: Never true    Ran Out of Food in the Last Year: Never true  Transportation Needs: No Transportation Needs (12/06/2022)   PRAPARE - Administrator, Civil Service (Medical): No    Lack of Transportation (Non-Medical): No  Physical Activity: Not on file  Stress: Not on file  Social Connections: Not on file  Intimate Partner Violence: Not At Risk (12/06/2022)   Humiliation, Afraid, Rape, and Kick questionnaire    Fear of Current or Ex-Partner: No    Emotionally Abused: No    Physically Abused: No    Sexually Abused: No    FAMILY HISTORY: Family History  Problem Relation Age of Onset   Heart attack Mother    Stroke Father    Colon cancer Neg Hx    Esophageal cancer Neg Hx    Pancreatic cancer Neg Hx    Rectal cancer Neg Hx    Stomach cancer Neg Hx    Colon polyps Neg Hx     ALLERGIES:  has no known allergies.  MEDICATIONS:  Current Outpatient Medications  Medication Sig Dispense Refill   acetaminophen  (TYLENOL ) 325 MG tablet Take 2 tablets (650 mg total) by mouth every 6 (six) hours as needed for mild pain (pain score 1-3) (or Fever >/= 101).     aspirin  EC 81 MG tablet  Take 81 mg by mouth daily. Swallow whole.     B-Complex TABS Take 1 tablet by mouth daily.     Cholecalciferol  (VITAMIN D ) 50 MCG (2000 UT) CAPS Take 2,000 Units by mouth daily. Monday-Friday     dexamethasone  (DECADRON ) 4 MG tablet Take 1 tablet (4 mg total) by mouth 2 (two) times daily. (Patient taking differently: Take 4 mg by mouth 2 (two) times daily. Take 1/2 Tablet Twice Daily For 2 weeks. Then 1/2 Tablet Daily For 2 weeks) 16 tablet 0   empagliflozin  (JARDIANCE ) 10 MG TABS tablet TAKE 1 TABLET BY MOUTH DAILY BEFORE BREAKFAST. 90 tablet 3   feeding supplement (ENSURE ENLIVE / ENSURE PLUS) LIQD Take 237 mLs by mouth 2 (two) times daily between meals. 237 mL 12   glucosamine-chondroitin 500-400 MG tablet Take 1 tablet by mouth See admin instructions. 5 times weekly     insulin  aspart (NOVOLOG ) 100 UNIT/ML injection Inject 5 Units into the skin 3 (three) times daily with meals. 10 mL 11   insulin  glargine-yfgn (SEMGLEE ) 100 UNIT/ML injection Inject 0.22 mLs (22 Units total) into the skin daily. 10 mL 11   metoprolol  succinate (TOPROL -XL) 50 MG 24 hr tablet Take 1 tablet (50 mg total) by mouth daily. Take with or immediately following a meal. 30 tablet 2   Multiple Vitamin (MULTIVITAMIN WITH MINERALS) TABS tablet Take 1 tablet by mouth See admin instructions. 5 times weekly     omeprazole  (PRILOSEC) 20 MG capsule Take 20 mg by mouth daily.     ondansetron  (ZOFRAN ) 4 MG tablet Take 1 tablet (4 mg total) by mouth every 6 (six) hours as needed for nausea. 20 tablet 0   polyethylene glycol (MIRALAX  / GLYCOLAX ) 17 g packet Take 17 g by mouth daily. 14 each 0   spironolactone  (ALDACTONE ) 25 MG tablet Take 0.5 tablets (12.5 mg total) by mouth daily. 30 tablet 2   vitamin B-12 (CYANOCOBALAMIN ) 1000 MCG tablet Take 1,000 mcg by mouth See admin instructions. 5 times a week     No  current facility-administered medications for this visit.    REVIEW OF SYSTEMS:    10 Point review of Systems was done is  negative except as noted above.   PHYSICAL EXAMINATION: ECOG PERFORMANCE STATUS: 2 - Symptomatic, <50% confined to bed  .There were no vitals taken for this visit.   GENERAL:alert, in no acute distress and comfortable SKIN: no acute rashes, no significant lesions EYES: conjunctiva are pink and non-injected, sclera anicteric OROPHARYNX: MMM, no exudates, no oropharyngeal erythema or ulceration NECK: supple, no JVD LYMPH:  no palpable lymphadenopathy in the cervical, axillary or inguinal regions LUNGS: clear to auscultation b/l with normal respiratory effort HEART: regular rate & rhythm ABDOMEN:  normoactive bowel sounds , non tender, not distended. Extremity: no pedal edema PSYCH: alert & oriented x 3 with fluent speech NEURO: no focal motor/sensory deficits   LABORATORY DATA:  I have reviewed the data as listed      Latest Ref Rng & Units 03/17/2023    2:31 PM 02/14/2023    1:30 PM 12/26/2022    4:34 PM  CBC  WBC 4.0 - 10.5 K/uL 8.6  10.3  15.8   Hemoglobin 13.0 - 17.0 g/dL 40.9  81.1  91.4   Hematocrit 39.0 - 52.0 % 36.3  35.9  34.8   Platelets 150 - 400 K/uL 284  347  200     .    Latest Ref Rng & Units 03/17/2023    2:31 PM 02/14/2023    1:30 PM 12/26/2022    4:34 PM  CMP  Glucose 70 - 99 mg/dL 782  956  213   BUN 8 - 23 mg/dL 19  14  44   Creatinine 0.61 - 1.24 mg/dL 0.86  5.78  4.69   Sodium 135 - 145 mmol/L 139  142  132   Potassium 3.5 - 5.1 mmol/L 4.2  4.4  4.2   Chloride 98 - 111 mmol/L 107  110  111   CO2 22 - 32 mmol/L 25  24  18    Calcium  8.9 - 10.3 mg/dL 9.8  9.7  8.4   Total Protein 6.5 - 8.1 g/dL 6.6  6.7  5.7   Total Bilirubin 0.0 - 1.2 mg/dL 0.6  0.7  0.9   Alkaline Phos 38 - 126 U/L 61  71  64   AST 15 - 41 U/L 13  11  60   ALT 0 - 44 U/L 8  9  355    . Lab Results  Component Value Date   LDH 139 03/17/2023   SURGICAL PATHOLOGY   THIS IS AN ADDENDUM REPORT   CASE: [id number]  PATIENT: Ambrose Dubey  Surgical Pathology Report  Addendum     Reason for Addendum #1:  Molecular Genetic Test Results, FISH   Clinical History: Left testicular mass (crm)      FINAL MICROSCOPIC DIAGNOSIS:   A. TESTICLE, LEFT, ORCHIECTOMY:  -  Diffuse aggressive large B-cell lymphoma  -  See comment    COMMENT:   Sections of testicle show architectural effacement by sheets of large  lymphoid cells with vesicular nuclei and pale cytoplasm.  There is  admixed apoptotic debris and increased mitotic activity.  By  immunohistochemistry, the large lymphoid cells are positive for CD20,  CD5 (dim), BCL6 (dim), MUM1, and BCL2.  They are negative for CD10, CD30  (<1%), cyclin D1, and TdT.  The Ki67 proliferation index is up to  approximately 70%.  CD3 highlights small T-cells in the background.  EBV  is negative by in situ hybridization.   Together, the findings support the diagnosis of a diffuse aggressive  large B-cell lymphoma. The differential diagnosis includes a diffuse  large B-cell lymphoma, NOS, with activated B-cell subtype by the [facility]  algorithm and high-grade B-cell lymphoma with MYC and BCL2 or BCL6  rearrangements.  FISH for BCL2, BCL6, and MYC rearrangements will be  performed. Correlation with clinical and radiographic findings is  recommended for consideration of a primary testicular lymphoma.   Result reported to L. Gibson on 03/16/20 at 1720 by S. O'Neill.    ADDENDUM:   FISH RESULTS:   Results: NORMAL   Interpretation:   BCL6 rearrangement:      Not Detected  MYC rearrangement:  Not Detected  MYC amplification:  Not Detected  BCL6 rearrangement:      Not Detected   Please note this testing was performed and interpreted by an outside  facility ([facility]).  This addendum is only being added to provide a  summary of the results for report completeness.  Please see electronic  medical record for a copy of the full report.   RADIOGRAPHIC STUDIES: I have personally reviewed the radiological images as listed and  agreed with the findings in the report. CUP PACEART REMOTE DEVICE CHECK Result Date: 06/01/2023 ICD Scheduled remote reviewed. Normal device function.  Presenting rhythm:  Regular VS 1 NSVT, V>A, HR 175, 12 beats in duration Next remote 91 days. LA, CVRS  MR Brain W Wo Contrast Result Date: 06/01/2023 CLINICAL DATA:  CNS lymphoma, assess treatment response EXAM: MRI HEAD WITHOUT AND WITH CONTRAST TECHNIQUE: Multiplanar, multiecho pulse sequences of the brain and surrounding structures were obtained without and with intravenous contrast. CONTRAST:  8mL GADAVIST  GADOBUTROL  1 MMOL/ML IV SOLN COMPARISON:  03/06/2013 FINDINGS: Brain: Patchy areas of T2 hyperintensity in the right frontal lobe, right basal ganglia, deep left cerebellum, greatest in the right frontal lobe where there is peripheral intrinsic T1 shortening. There is a new area of wispy nodular enhancement along the posterior aspect of the right frontal cystic encephalomalacia which measures 5 mm. No infarct, acute hemorrhage, hydrocephalus, or collection. Vascular: Major flow voids and vascular enhancements are preserved Skull and upper cervical spine: Normal marrow signal Sinuses/Orbits: Negative IMPRESSION: Interval 5 mm focus of enhancement at the right frontal treatment site, early recurrence is possible, recommend close follow-up imaging. Electronically Signed   By: Ronnette Coke M.D.   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  1) Left-sided primary testicular large B-cell lymphoma.- currently in remission S/p ipsilateral orchiectomy S/p 6 cycles of R-CEOP and 3 dose of IT MTX (for CNS prophylaxis) and R Tto contralateral testis. BCL-2, BCL 6, c-Myc negative.  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 CBC showed WBC of 7.4K, hemoglobin of 11.1, and platelets of 246K. -CBC stable with no major changes -minimal anemia -WBCS and platelets remain normal -CMP shows signs of mild dehyrdation with mildly elevated kidney numbers - BUN 25 -electrolytes and liver  functions normal -LDH pending -discussed results of 05/25/2023 Brain MRI with patient and his wife in detail. Discussed finding of Interval 5 mm focus of enhancement at the right frontal treatment site. Discussed that it was difficult for radiologist to determine if findings are related to very early recurrence of disease or if there is a flare there as a result of radiation injuring the blood vessel in the area.  -recommend additional opinion by neuro-oncologist, Dr. Mark Sil, to evaluate whether there may be radiation necrosis or obvious recurrance of lymphoma based on scan -discussed that if there is concern for lymphoma recurrence, there may be a role for radiation therapy or high-dose chemotherapy -did not feel any enlarged lymph nodes during physical examination -patient has no symptoms suggestive of lymphoma recurrence at this time -officially do NOT recommend patient to drive -discussed that we do not conduct functional assessments -if patient is inclined, discussed option for PCP to refer patient to physical medicine and rehabilitation doctor for considerations of receiving functional assessment with them -educated patient that sometimes confusion or seizure in the frontal lobe can be very subtle -recommend connecting with PCP for considerations of possibly adjusting cholesterol medication and to ask any questions patient may  have about recent lab testing with PCP -discussed that generally with CKD, there is a small amount of protein typically found in the urine which can fluctuate from time to time -answered all of patient's and his wife's questions in detail  FOLLOW-UP: ***  The total time spent in the appointment was *** minutes* .  All of the patient's questions were answered with apparent satisfaction. The patient knows to call the clinic with any problems, questions or concerns.   Jacquelyn Matt MD MS AAHIVMS [facility] Hematology/Oncology Physician [facility]  .*Total Encounter Time as defined by the Centers for Medicare and Medicaid Services includes, in addition to the face-to-face time of a patient visit (documented in the note above) non-face-to-face time: obtaining and reviewing outside history, ordering and reviewing medications, tests or procedures, care coordination (communications with other health care professionals or caregivers) and documentation in the medical record.     I,Mitra Faeizi,acting as a Neurosurgeon for Jacquelyn Matt, MD.,have documented all relevant documentation on the behalf of Jacquelyn Matt, MD,as directed by  Jacquelyn Matt, MD while in the presence of Jacquelyn Matt, MD.   ***

## 2023-06-02 ENCOUNTER — Inpatient Hospital Stay: Attending: Hematology

## 2023-06-02 ENCOUNTER — Telehealth: Payer: Self-pay | Admitting: Cardiovascular Disease

## 2023-06-02 ENCOUNTER — Inpatient Hospital Stay (HOSPITAL_BASED_OUTPATIENT_CLINIC_OR_DEPARTMENT_OTHER): Admitting: Hematology

## 2023-06-02 VITALS — BP 134/82 | HR 78 | Temp 97.9°F | Resp 14 | Wt 185.4 lb

## 2023-06-02 DIAGNOSIS — E119 Type 2 diabetes mellitus without complications: Secondary | ICD-10-CM | POA: Insufficient documentation

## 2023-06-02 DIAGNOSIS — C8330 Diffuse large B-cell lymphoma, unspecified site: Secondary | ICD-10-CM

## 2023-06-02 DIAGNOSIS — Z85828 Personal history of other malignant neoplasm of skin: Secondary | ICD-10-CM | POA: Diagnosis not present

## 2023-06-02 DIAGNOSIS — R350 Frequency of micturition: Secondary | ICD-10-CM | POA: Insufficient documentation

## 2023-06-02 DIAGNOSIS — I10 Essential (primary) hypertension: Secondary | ICD-10-CM | POA: Insufficient documentation

## 2023-06-02 DIAGNOSIS — C7931 Secondary malignant neoplasm of brain: Secondary | ICD-10-CM | POA: Diagnosis not present

## 2023-06-02 DIAGNOSIS — E785 Hyperlipidemia, unspecified: Secondary | ICD-10-CM | POA: Diagnosis not present

## 2023-06-02 DIAGNOSIS — I252 Old myocardial infarction: Secondary | ICD-10-CM | POA: Insufficient documentation

## 2023-06-02 DIAGNOSIS — C83398 Diffuse large b-cell lymphoma of other extranodal and solid organ sites: Secondary | ICD-10-CM | POA: Insufficient documentation

## 2023-06-02 DIAGNOSIS — G939 Disorder of brain, unspecified: Secondary | ICD-10-CM

## 2023-06-02 DIAGNOSIS — K219 Gastro-esophageal reflux disease without esophagitis: Secondary | ICD-10-CM | POA: Diagnosis not present

## 2023-06-02 LAB — CBC WITH DIFFERENTIAL (CANCER CENTER ONLY)
Abs Immature Granulocytes: 0.02 10*3/uL (ref 0.00–0.07)
Basophils Absolute: 0.1 10*3/uL (ref 0.0–0.1)
Basophils Relative: 1 %
Eosinophils Absolute: 0.1 10*3/uL (ref 0.0–0.5)
Eosinophils Relative: 2 %
HCT: 34.7 % — ABNORMAL LOW (ref 39.0–52.0)
Hemoglobin: 11.1 g/dL — ABNORMAL LOW (ref 13.0–17.0)
Immature Granulocytes: 0 %
Lymphocytes Relative: 12 %
Lymphs Abs: 0.9 10*3/uL (ref 0.7–4.0)
MCH: 26.6 pg (ref 26.0–34.0)
MCHC: 32 g/dL (ref 30.0–36.0)
MCV: 83 fL (ref 80.0–100.0)
Monocytes Absolute: 0.5 10*3/uL (ref 0.1–1.0)
Monocytes Relative: 7 %
Neutro Abs: 5.8 10*3/uL (ref 1.7–7.7)
Neutrophils Relative %: 78 %
Platelet Count: 246 10*3/uL (ref 150–400)
RBC: 4.18 MIL/uL — ABNORMAL LOW (ref 4.22–5.81)
RDW: 13.7 % (ref 11.5–15.5)
WBC Count: 7.4 10*3/uL (ref 4.0–10.5)
nRBC: 0 % (ref 0.0–0.2)

## 2023-06-02 LAB — CMP (CANCER CENTER ONLY)
ALT: 8 U/L (ref 0–44)
AST: 11 U/L — ABNORMAL LOW (ref 15–41)
Albumin: 4.2 g/dL (ref 3.5–5.0)
Alkaline Phosphatase: 59 U/L (ref 38–126)
Anion gap: 6 (ref 5–15)
BUN: 25 mg/dL — ABNORMAL HIGH (ref 8–23)
CO2: 24 mmol/L (ref 22–32)
Calcium: 9.4 mg/dL (ref 8.9–10.3)
Chloride: 110 mmol/L (ref 98–111)
Creatinine: 1.75 mg/dL — ABNORMAL HIGH (ref 0.61–1.24)
GFR, Estimated: 39 mL/min — ABNORMAL LOW (ref >60)
Glucose, Bld: 186 mg/dL — ABNORMAL HIGH (ref 70–99)
Potassium: 4 mmol/L (ref 3.5–5.1)
Sodium: 140 mmol/L (ref 135–145)
Total Bilirubin: 0.6 mg/dL (ref 0.0–1.2)
Total Protein: 6.8 g/dL (ref 6.5–8.1)

## 2023-06-02 LAB — LACTATE DEHYDROGENASE: LDH: 122 U/L (ref 98–192)

## 2023-06-02 NOTE — Telephone Encounter (Signed)
 Paper Work Dropped Off: Chronic Condition Special Needs form   Date:06/02/2023  Location of paper:  Dr. Salem Crater Mailbox

## 2023-06-05 ENCOUNTER — Encounter: Payer: Self-pay | Admitting: Internal Medicine

## 2023-06-09 ENCOUNTER — Encounter: Payer: Self-pay | Admitting: Hematology

## 2023-06-13 ENCOUNTER — Encounter: Payer: Self-pay | Admitting: Hematology

## 2023-06-13 ENCOUNTER — Inpatient Hospital Stay (HOSPITAL_BASED_OUTPATIENT_CLINIC_OR_DEPARTMENT_OTHER): Admitting: Internal Medicine

## 2023-06-13 VITALS — BP 143/84 | HR 74 | Temp 97.8°F | Resp 14 | Wt 184.8 lb

## 2023-06-13 DIAGNOSIS — G939 Disorder of brain, unspecified: Secondary | ICD-10-CM

## 2023-06-13 DIAGNOSIS — C7931 Secondary malignant neoplasm of brain: Secondary | ICD-10-CM | POA: Diagnosis not present

## 2023-06-13 DIAGNOSIS — E785 Hyperlipidemia, unspecified: Secondary | ICD-10-CM | POA: Diagnosis not present

## 2023-06-13 DIAGNOSIS — I10 Essential (primary) hypertension: Secondary | ICD-10-CM | POA: Diagnosis not present

## 2023-06-13 DIAGNOSIS — R350 Frequency of micturition: Secondary | ICD-10-CM | POA: Diagnosis not present

## 2023-06-13 DIAGNOSIS — E119 Type 2 diabetes mellitus without complications: Secondary | ICD-10-CM | POA: Diagnosis not present

## 2023-06-13 DIAGNOSIS — I252 Old myocardial infarction: Secondary | ICD-10-CM | POA: Diagnosis not present

## 2023-06-13 DIAGNOSIS — Z85828 Personal history of other malignant neoplasm of skin: Secondary | ICD-10-CM | POA: Diagnosis not present

## 2023-06-13 DIAGNOSIS — C8330 Diffuse large B-cell lymphoma, unspecified site: Secondary | ICD-10-CM

## 2023-06-13 DIAGNOSIS — K219 Gastro-esophageal reflux disease without esophagitis: Secondary | ICD-10-CM | POA: Diagnosis not present

## 2023-06-13 DIAGNOSIS — C83398 Diffuse large b-cell lymphoma of other extranodal and solid organ sites: Secondary | ICD-10-CM | POA: Diagnosis not present

## 2023-06-13 NOTE — Progress Notes (Signed)
 [facility] [address]  [place], [place] [address] [phone]   New Patient Evaluation  Date of Service: 06/13/23 Patient Name: Roy [name] Patient MRN: [id number] Patient DOB: 1945-02-25 Provider: Mamie Searles, MD  Identifying Statement:  Roy [name] is a 78 y.o. male with Metastasis to brain [facility]) - Plan: MR BRAIN W WO CONTRAST  Diffuse large B-cell lymphoma, unspecified body region [facility]) - Plan: MR BRAIN W WO CONTRAST  Brain lesion who presents for initial consultation and evaluation regarding cancer associated neurologic deficits.    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   07/13/2020 - 11/25/2020 Chemotherapy   Patient is on Treatment Plan : NON-HODGKIN'S LYMPHOMA R-CEOP Q21D     02/09/2021 Cancer Staging   Staging form: Hodgkin and Non-Hodgkin Lymphoma, AJCC 8th Edition - Clinical: Stage IE (Diffuse large B-cell lymphoma) - Signed by Frankie Israel, MD on 02/09/2021   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  12/31/2020 Initial Diagnosis   Diffuse large b-cell lymphoma, extranodal and solid organ sites [facility])   02/09/2021 Cancer Staging   Staging form: Hodgkin and Non-Hodgkin Lymphoma, AJCC 8th Edition - Clinical: Stage IE (Diffuse large B-cell lymphoma) - Signed by Frankie Israel, MD on 02/09/2021 Stage prefix: Initial diagnosis     CNS Oncologic History 01/17/23: Right frontal secondary lymphoma, completes WBRT with Dr. Lorri Rota, 17 fractions  History of Present Illness: The patient's records from the referring physician were obtained and reviewed and the patient interviewed to confirm this HPI.  Roy [name]  presents today for evaluation following recent MRI brain.  He describes some issues with fatigue and short term memory loss, more prominent than prior to radiation.  He also has been experiencing occasional episodes of loss of function with both legs, leading to impaired balance and one notable fall.  No frank seizures or headaches.  He continues to otherwise follow with Dr. Salomon Cree, remains on observation and in remission for systemic B-cell lymphoma.  Medications: Current Outpatient Medications on File Prior to Visit  Medication Sig Dispense Refill   aspirin  EC 81 MG tablet Take 81 mg by mouth daily. Swallow whole.     B-Complex TABS Take 1 tablet by mouth daily.     Cholecalciferol  (VITAMIN D ) 50 MCG (2000 UT) CAPS Take 2,000 Units by mouth daily. Monday-Friday     empagliflozin  (JARDIANCE ) 10 MG TABS tablet TAKE 1 TABLET BY MOUTH DAILY BEFORE BREAKFAST. 90 tablet 3   glucosamine-chondroitin 500-400 MG tablet Take 1 tablet by mouth See admin instructions. 5 times weekly     metoprolol  succinate (TOPROL -XL) 50 MG 24 hr tablet Take 1 tablet (50 mg total) by mouth daily. Take with or immediately following a meal. 30 tablet 2   Multiple Vitamin (MULTIVITAMIN WITH MINERALS) TABS tablet Take 1 tablet by mouth See admin instructions. 5 times weekly     omeprazole  (PRILOSEC) 20 MG capsule Take 20 mg by mouth daily.     spironolactone  (ALDACTONE ) 25 MG tablet Take 0.5 tablets (12.5 mg total) by mouth daily. 30 tablet 2   feeding supplement (ENSURE ENLIVE / ENSURE PLUS) LIQD Take 237 mLs by mouth 2 (two) times daily between meals. (Patient not taking:  Reported on 06/13/2023) 237 mL 12   insulin  aspart (NOVOLOG ) 100 UNIT/ML injection Inject 5 Units into the skin 3 (three) times daily with meals. (Patient not taking: Reported on 06/13/2023) 10 mL 11   insulin  glargine-yfgn (SEMGLEE ) 100 UNIT/ML injection Inject 0.22 mLs (22 Units total) into the skin daily. (Patient not taking: Reported on 06/13/2023) 10 mL 11    No current facility-administered medications on file prior to visit.    Allergies: No Known Allergies Past Medical History:  Past Medical History:  Diagnosis Date   Allergic rhinitis    Arthritis    wrists   Cardiomyopathy, nonischemic (HCC)    followed by cardiology--- dr Nicholette Barley---  2016 ef 45-50% ,  2016 nuclear ef 37%,  2017 per echo ef 50-55%   CHF (congestive heart failure), NYHA class III (HCC) 03/21/2020   Coronary artery disease    GERD (gastroesophageal reflux disease)    Hiatal hernia    History of kidney stones    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;   01/ 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   History of syncope (03-06-2020 pt stated has not had sycopal episode in few years, stated it seems to happen in extreme hot conditions)   cardiologist--- dr Dawson Europe--- dx recurrent syncope;  nuclear study 11-03-2014 intermediate risk w/ no ischemia, apical hypokinesis, nuclear ef 37%;  event monitor-- 12-21-2015 SB/ ST  no pauses/ arrythmia's;  echo 08-03-2015 ef 50-55%   Hyperlipidemia    Hypertension    followed by pcp   Hypovitaminosis D    Mass of left testicle    Nocturia    Plantar fasciitis    Presence of surgical incision    01/ 2022  moh's w/ skin graft of nose, per pt still healing and wear bandage daily   Type 2 diabetes mellitus (HCC)    pt is adament that he is not and have been told he is a diabetic but a borderline;  followed by pcp, in pcp note states DM2 and takes 2 meds daily   Wears glasses    Wears hearing aid in both ears    Weight loss 11/16/2020   Past Surgical History:  Past Surgical History:  Procedure Laterality Date   COLONOSCOPY  last one 01-30-2017   CORONARY ARTERY BYPASS GRAFT N/A 03/25/2020   Procedure: CORONARY ARTERY BYPASS GRAFTING (CABG), ON PUMP, TIMES FOUR, USING BILATERAL INTERNAL MAMMARY ARTERIES AND LEFT RADIAL ARTERY;  Surgeon: Rudine Cos, MD;  Location: [facility] OR;  Service: Open Heart  Surgery;  Laterality: N/A;   ICD IMPLANT N/A 08/23/2021   Procedure: ICD IMPLANT;  Surgeon: Tammie Fall, MD;  Location: [facility] INVASIVE CV LAB;  Service: Cardiovascular;  Laterality: N/A;   IR IMAGING GUIDED PORT INSERTION  07/06/2020   LOW ANTERIOR RESECTION RECTUM W/ COLOPROCTOSTOMY  05/2003   MOHS SURGERY  01/2020   nose w/ graft   ORCHIECTOMY Left 03/11/2020   Procedure: Wess Hammed;  Surgeon: Adelbert Homans, MD;  Location: [facility];  Service: Urology;  Laterality: Left;  ONLY NEEDS 60 MIN   PORTA CATH REMOVAL N/A 08/23/2021   Procedure: PORTA CATH REMOVAL;  Surgeon: Tammie Fall, MD;  Location: [facility] INVASIVE CV LAB;  Service: Cardiovascular;  Laterality: N/A;   RADIAL ARTERY HARVEST Left 03/25/2020   Procedure: LEFT RADIAL ARTERY HARVEST;  Surgeon: Rudine Cos, MD;  Location: [facility] OR;  Service: Open Heart Surgery;  Laterality: Left;   RIGHT/LEFT HEART CATH AND CORONARY ANGIOGRAPHY N/A 03/23/2020   Procedure: RIGHT/LEFT HEART CATH AND CORONARY ANGIOGRAPHY;  Surgeon: Lucendia Rusk, MD;  Location: [facility] INVASIVE CV LAB;  Service: Cardiovascular;  Laterality: N/A;   SHOULDER SURGERY Right 1992; 07/ 2021   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 Left 12/24/2008   left ear and nose    TEE WITHOUT CARDIOVERSION N/A 03/25/2020   Procedure: TRANSESOPHAGEAL ECHOCARDIOGRAM (TEE);  Surgeon: Rudine Cos, MD;  Location: [facility] OR;  Service: Open Heart Surgery;  Laterality: N/A;   TONSILLECTOMY AND ADENOIDECTOMY  child   UPPER GASTROINTESTINAL ENDOSCOPY  last one 06-06-2017   Social History:  Social History   Socioeconomic History   Marital status: Married    Spouse name: Not on file   Number of children: Not on file   Years of education: Not on file   Highest education level: Not on file  Occupational History   Not on file  Tobacco Use   Smoking status: Former    Types: Pipe    Quit date: 11/29/1976    Years since quitting: 46.5    Smokeless tobacco: Never  Vaping Use   Vaping status: Never Used  Substance and Sexual Activity   Alcohol use: Yes    Alcohol/week: 0.0 standard drinks of alcohol    Comment: Occasional drink    Drug use: Never   Sexual activity: Not on file  Other Topics Concern   Not on file  Social History Narrative   Not on file   Social Drivers of Health   Financial Resource Strain: Not on file  Food Insecurity: No Food Insecurity (12/06/2022)   Hunger Vital Sign    Worried About Running Out of Food in the Last Year: Never true    Ran Out of Food in the Last Year: Never true  Transportation Needs: No Transportation Needs (12/06/2022)   PRAPARE - Administrator, Civil Service (Medical): No    Lack of Transportation (Non-Medical): No  Physical Activity: Not on file  Stress: Not on file  Social Connections: Not on file  Intimate Partner Violence: Not At Risk (12/06/2022)   Humiliation, Afraid, Rape, and Kick questionnaire    Fear of Current or Ex-Partner: No    Emotionally Abused: No    Physically Abused: No    Sexually Abused: No   Family History:  Family History  Problem Relation Age of Onset   Heart attack Mother    Stroke Father    Colon cancer Neg Hx    Esophageal cancer Neg Hx    Pancreatic cancer Neg Hx    Rectal cancer Neg Hx    Stomach cancer Neg Hx    Colon polyps Neg Hx     Review of Systems: Constitutional: Doesn't report fevers, chills or abnormal weight loss Eyes: Doesn't report blurriness of vision Ears, nose, mouth, throat, and face: Doesn't report sore throat Respiratory: Doesn't report cough, dyspnea or wheezes Cardiovascular: Doesn't report palpitation, chest discomfort  Gastrointestinal:  Doesn't report nausea, constipation, diarrhea GU: Doesn't report incontinence Skin: Doesn't report skin rashes Neurological: Per HPI Musculoskeletal: Doesn't report joint pain Behavioral/Psych: Doesn't report anxiety  Physical Exam: Vitals:   06/13/23  1514  BP: (!) 143/84  Pulse: 74  Resp: 14  Temp: 97.8 F (36.6 C)  SpO2: 100%   KPS: 70. General: Alert, cooperative, pleasant, in no acute distress Head: Normal EENT: No conjunctival injection or scleral icterus.  Lungs: Resp effort  normal Cardiac: Regular rate Abdomen: Non-distended abdomen Skin: No rashes cyanosis or petechiae. Extremities: No clubbing or edema  Neurologic Exam: Mental Status: Awake, alert, attentive to examiner. Oriented to self and environment. Language is fluent with intact comprehension.  Cranial Nerves: Visual acuity is grossly normal. Visual fields are full. Extra-ocular movements intact. No ptosis. Face is symmetric Motor: Tone and bulk are normal. Power is full in both arms and legs. Reflexes are symmetric, no pathologic reflexes present.  Sensory: Intact to light touch Gait: Orthopedic dystaxia   Labs: I have reviewed the data as listed    Component Value Date/Time   NA 140 06/02/2023 1033   NA 139 09/02/2022 1000   K 4.0 06/02/2023 1033   CL 110 06/02/2023 1033   CO2 24 06/02/2023 1033   GLUCOSE 186 (H) 06/02/2023 1033   BUN 25 (H) 06/02/2023 1033   BUN 29 (H) 09/02/2022 1000   CREATININE 1.75 (H) 06/02/2023 1033   CREATININE 1.17 04/23/2020 0000   CALCIUM  9.4 06/02/2023 1033   PROT 6.8 06/02/2023 1033   ALBUMIN  4.2 06/02/2023 1033   AST 11 (L) 06/02/2023 1033   ALT 8 06/02/2023 1033   ALKPHOS 59 06/02/2023 1033   BILITOT 0.6 06/02/2023 1033   GFRNONAA 39 (L) 06/02/2023 1033   GFRAA >60 09/27/2015 1348   Lab Results  Component Value Date   WBC 7.4 06/02/2023   NEUTROABS 5.8 06/02/2023   HGB 11.1 (L) 06/02/2023   HCT 34.7 (L) 06/02/2023   MCV 83.0 06/02/2023   PLT 246 06/02/2023    Imaging:  CUP PACEART REMOTE DEVICE CHECK Result Date: 06/01/2023 ICD Scheduled remote reviewed. Normal device function.  Presenting rhythm:  Regular VS 1 NSVT, V>A, HR 175, 12 beats in duration Next remote 91 days. LA, CVRS  MR Brain W Wo  Contrast Result Date: 06/01/2023 CLINICAL DATA:  CNS lymphoma, assess treatment response EXAM: MRI HEAD WITHOUT AND WITH CONTRAST TECHNIQUE: Multiplanar, multiecho pulse sequences of the brain and surrounding structures were obtained without and with intravenous contrast. CONTRAST:  8mL GADAVIST  GADOBUTROL  1 MMOL/ML IV SOLN COMPARISON:  03/06/2013 FINDINGS: Brain: Patchy areas of T2 hyperintensity in the right frontal lobe, right basal ganglia, deep left cerebellum, greatest in the right frontal lobe where there is peripheral intrinsic T1 shortening. There is a new area of wispy nodular enhancement along the posterior aspect of the right frontal cystic encephalomalacia which measures 5 mm. No infarct, acute hemorrhage, hydrocephalus, or collection. Vascular: Major flow voids and vascular enhancements are preserved Skull and upper cervical spine: Normal marrow signal Sinuses/Orbits: Negative IMPRESSION: Interval 5 mm focus of enhancement at the right frontal treatment site, early recurrence is possible, recommend close follow-up imaging. Electronically Signed   By: Ronnette Coke M.D.   On: 06/01/2023 07:15    [facility] Clinician Interpretation: I have personally reviewed the radiological images as listed.  My interpretation, in the context of the patient's clinical presentation, is treatment effect vs true progression   Assessment/Plan Metastasis to brain [facility]) - Plan: MR BRAIN W WO CONTRAST  Diffuse large B-cell lymphoma, unspecified body region [facility]) - Plan: MR BRAIN W WO CONTRAST  Brain lesion  Roy [name] is clinically stable today, now 6 months s/p whole brain radiation therapy.    MRI brain demonstrates small focus of enhancement adjacent to primary treatment site within right frontal lobe.  Etiology is unclear, either early recurrence of tumor or radiation treatment effect.  Recommended continued MRI surveillance, with repeat study in 1 month.  They are agreeable with this plan.  Episodes of  dystaxia, bilateral leg weakness, we suspect are secondary to advanced cervical spondylosis, visible on total spine MRI from late 2024.  He is not a good candidate for any intervention at this time.  We spent twenty additional minutes teaching regarding the natural history, biology, and historical experience in the treatment of neurologic complications of cancer.   We appreciate the opportunity to participate in the care of Roy [name].   We ask that Roy [name] return to clinic in 1 months following next brain MRI, or sooner as needed.  All questions were answered. The patient knows to call the clinic with any problems, questions or concerns. No barriers to learning were detected.  The total time spent in the encounter was 40 minutes and more than 50% was on counseling and review of test results   Mamie Searles, MD Medical Director of Neuro-Oncology [facility] [place] 06/13/23 4:28 PM

## 2023-06-15 ENCOUNTER — Telehealth: Payer: Self-pay

## 2023-06-15 NOTE — Telephone Encounter (Signed)
 Notified Patient of completion of chronic condition verification form. Copy of form mailed to Patient as requested. No other needs or concerns noted at this time.

## 2023-06-22 ENCOUNTER — Telehealth: Payer: Self-pay

## 2023-06-22 NOTE — Telephone Encounter (Signed)
 Patient identification verified by 2 forms. Roy Duck, RN     Called patient and wife Roy [name] answered the phone. Roy [name] verified patient full name/DOB.   Informed Roy [name] I received verification of chronic condition form from [facility]. Dr. Rolm Clos will be in office tomorrow 5/30. Will have provider sign form and send back to [facility].    Informed Roy [name] the form is missing some patient required information so [employer] may reach back out to them, however form will be faxed back before deadline.    Roy [name] agrees with plan, no questions at this time

## 2023-06-26 NOTE — Telephone Encounter (Signed)
 Patient identification verified by 2 forms. Sims Duck, RN     Paperwork signed and faxed to [facility] on 5/30.   Verified today via phone plan was updated and verified by PCP for diabetes.  Cardiac information was not needed. Patients plan has been continued.

## 2023-07-10 NOTE — Progress Notes (Signed)
 Remote ICD transmission.

## 2023-07-13 ENCOUNTER — Ambulatory Visit (HOSPITAL_COMMUNITY)
Admission: RE | Admit: 2023-07-13 | Discharge: 2023-07-13 | Disposition: A | Discharge status: Home / Self Care | Source: Ambulatory Visit | Attending: Internal Medicine | Admitting: Internal Medicine

## 2023-07-13 DIAGNOSIS — G9389 Other specified disorders of brain: Secondary | ICD-10-CM | POA: Diagnosis not present

## 2023-07-13 DIAGNOSIS — C8330 Diffuse large B-cell lymphoma, unspecified site: Secondary | ICD-10-CM | POA: Insufficient documentation

## 2023-07-13 DIAGNOSIS — C7931 Secondary malignant neoplasm of brain: Secondary | ICD-10-CM | POA: Insufficient documentation

## 2023-07-13 MED ORDER — GADOBUTROL 1 MMOL/ML IV SOLN
8.0000 mL | Freq: Once | INTRAVENOUS | Status: AC | PRN
  Administered 2023-07-13: 8 mL via INTRAVENOUS

## 2023-07-17 ENCOUNTER — Other Ambulatory Visit: Payer: Self-pay | Admitting: Cardiovascular Disease

## 2023-07-18 ENCOUNTER — Inpatient Hospital Stay: Attending: Hematology | Admitting: Internal Medicine

## 2023-07-18 VITALS — BP 131/76 | HR 82 | Temp 97.3°F | Resp 18 | Wt 185.8 lb

## 2023-07-18 DIAGNOSIS — C8339 Primary central nervous system lymphoma: Secondary | ICD-10-CM | POA: Diagnosis not present

## 2023-07-18 DIAGNOSIS — Z87891 Personal history of nicotine dependence: Secondary | ICD-10-CM | POA: Insufficient documentation

## 2023-07-18 NOTE — Progress Notes (Signed)
 [facility] at [facility] 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    Referring Provider: Gerome Brunet, DO 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   07/13/2020 - 11/25/2020 Chemotherapy   Patient is on Treatment Plan : NON-HODGKIN'S LYMPHOMA R-CEOP Q21D     02/09/2021 Cancer Staging   Staging form: Hodgkin and Non-Hodgkin Lymphoma, AJCC 8th Edition - Clinical: Stage IE (Diffuse large B-cell lymphoma) - Signed by Onesimo Emaline Brink, MD on 02/09/2021   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  12/31/2020 Initial Diagnosis   Diffuse large b-cell lymphoma, extranodal and solid organ sites [facility])   02/09/2021 Cancer Staging   Staging form: Hodgkin and Non-Hodgkin Lymphoma, AJCC 8th Edition - Clinical: Stage IE (Diffuse large B-cell lymphoma) - Signed by Onesimo Emaline Brink, MD on 02/09/2021 Stage prefix: Initial diagnosis     CNS Oncologic History 01/17/23: Right frontal secondary lymphoma, completes WBRT with Dr. Patrcia, 17 fractions  Interval History: Roy [name] presents after recent MRI brain study.  Denies new or progressive changes.  Continues to have issues with memory, overall energy.  Denies headaches, seizures.  H+P (06/13/23) Patient presents today for evaluation following recent MRI brain.  He describes some issues with fatigue and short term  memory loss, more prominent than prior to radiation.  He also has been experiencing occasional episodes of loss of function with both legs, leading to impaired balance and one notable fall.  No frank seizures or headaches.  He continues to otherwise follow with Dr. Onesimo, remains on observation and in remission for systemic B-cell lymphoma.  Medications: Current Outpatient Medications on File Prior to Visit  Medication Sig Dispense Refill   aspirin  EC 81 MG tablet Take 81 mg by mouth daily. Swallow whole.     B-Complex TABS Take 1 tablet by mouth daily.     Cholecalciferol  (VITAMIN D ) 50 MCG (2000 UT) CAPS Take 2,000 Units by mouth daily. Monday-Friday     empagliflozin  (JARDIANCE ) 10 MG TABS tablet TAKE 1 TABLET BY MOUTH DAILY BEFORE BREAKFAST. 90 tablet 3   feeding supplement (ENSURE ENLIVE / ENSURE PLUS) LIQD Take 237 mLs by mouth 2 (two) times daily between meals. (Patient not taking: Reported on 06/13/2023) 237 mL 12   glucosamine-chondroitin 500-400 MG tablet Take 1 tablet by mouth See admin instructions. 5 times weekly     insulin  aspart (NOVOLOG ) 100 UNIT/ML injection Inject 5 Units into the skin 3 (three) times daily with meals. (Patient not taking: Reported on 06/13/2023) 10 mL 11   insulin  glargine-yfgn (SEMGLEE ) 100 UNIT/ML injection Inject 0.22 mLs (22 Units total) into the skin daily. (Patient not taking: Reported on 06/13/2023) 10 mL 11   metoprolol  succinate (TOPROL -XL) 50 MG 24 hr tablet Take 1 tablet (50 mg total) by mouth daily. Take with or immediately following a meal. 30 tablet 2   Multiple Vitamin (MULTIVITAMIN WITH MINERALS) TABS tablet Take 1 tablet by mouth See admin  instructions. 5 times weekly     omeprazole  (PRILOSEC) 20 MG capsule Take 20 mg by mouth daily.     spironolactone  (ALDACTONE ) 25 MG tablet TAKE 1/2 TABLET (12.5 MG TOTAL) BY MOUTH DAILY. 30 tablet 2   No current facility-administered medications on file prior to visit.    Allergies: No Known Allergies Past  Medical History:  Past Medical History:  Diagnosis Date   Allergic rhinitis    Arthritis    wrists   Cardiomyopathy, nonischemic (HCC)    followed by cardiology--- dr maranda---  2016 ef 45-50% ,  2016 nuclear ef 37%,  2017 per echo ef 50-55%   CHF (congestive heart failure), NYHA class III (HCC) 03/21/2020   Coronary artery disease    GERD (gastroesophageal reflux disease)    Hiatal hernia    History of kidney stones    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;   01/ 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   History of syncope (03-06-2020 pt stated has not had sycopal episode in few years, stated it seems to happen in extreme hot conditions)   cardiologist--- dr lois maranda--- dx recurrent syncope;  nuclear study 11-03-2014 intermediate risk w/ no ischemia, apical hypokinesis, nuclear ef 37%;  event monitor-- 12-21-2015 SB/ ST  no pauses/ arrythmia's;  echo 08-03-2015 ef 50-55%   Hyperlipidemia    Hypertension    followed by pcp   Hypovitaminosis D    Mass of left testicle    Nocturia    Plantar fasciitis    Presence of surgical incision    01/ 2022  moh's w/ skin graft of nose, per pt still healing and wear bandage daily   Type 2 diabetes mellitus (HCC)    pt is adament that he is not and have been told he is a diabetic but a borderline;  followed by pcp, in pcp note states DM2 and takes 2 meds daily   Wears glasses    Wears hearing aid in both ears    Weight loss 11/16/2020   Past Surgical History:  Past Surgical History:  Procedure Laterality Date   COLONOSCOPY  last one 01-30-2017   CORONARY ARTERY BYPASS GRAFT N/A 03/25/2020   Procedure: CORONARY ARTERY BYPASS GRAFTING (CABG), ON PUMP, TIMES FOUR, USING BILATERAL INTERNAL MAMMARY ARTERIES AND LEFT RADIAL ARTERY;  Surgeon: German Bartlett PEDLAR, MD;  Location: [facility] OR;  Service: Open Heart Surgery;  Laterality: N/A;   ICD IMPLANT N/A 08/23/2021   Procedure: ICD IMPLANT;  Surgeon: Waddell Danelle ORN, MD;   Location: [facility] INVASIVE CV LAB;  Service: Cardiovascular;  Laterality: N/A;   IR IMAGING GUIDED PORT INSERTION  07/06/2020   LOW ANTERIOR RESECTION RECTUM W/ COLOPROCTOSTOMY  05/2003   MOHS SURGERY  01/2020   nose w/ graft   ORCHIECTOMY Left 03/11/2020   Procedure: CASSAUNDRA FONTANA;  Surgeon: Devere Lonni Righter, MD;  Location: [facility];  Service: Urology;  Laterality: Left;  ONLY NEEDS 60 MIN   PORTA CATH REMOVAL N/A 08/23/2021   Procedure: PORTA CATH REMOVAL;  Surgeon: Waddell Danelle ORN, MD;  Location: [facility] INVASIVE CV LAB;  Service: Cardiovascular;  Laterality: N/A;   RADIAL ARTERY HARVEST Left 03/25/2020   Procedure: LEFT RADIAL ARTERY HARVEST;  Surgeon: German Bartlett PEDLAR, MD;  Location: [facility] OR;  Service: Open Heart Surgery;  Laterality: Left;   RIGHT/LEFT HEART CATH AND CORONARY ANGIOGRAPHY N/A 03/23/2020   Procedure: RIGHT/LEFT HEART CATH AND CORONARY ANGIOGRAPHY;  Surgeon: Dann Candyce RAMAN, MD;  Location: [facility] INVASIVE CV LAB;  Service: Cardiovascular;  Laterality: N/A;   SHOULDER SURGERY Right 1992; 07/ 2021   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 Left 12/24/2008   left ear and nose    TEE WITHOUT CARDIOVERSION N/A 03/25/2020   Procedure: TRANSESOPHAGEAL ECHOCARDIOGRAM (TEE);  Surgeon: German Bartlett PEDLAR, MD;  Location: [facility] OR;  Service: Open Heart Surgery;  Laterality: N/A;   TONSILLECTOMY AND ADENOIDECTOMY  child   UPPER GASTROINTESTINAL ENDOSCOPY  last one 06-06-2017   Social History:  Social History   Socioeconomic History   Marital status: Married    Spouse name: Not on file   Number of children: Not on file   Years of education: Not on file   Highest education level: Not on file  Occupational History   Not on file  Tobacco Use   Smoking status: Former    Types: Pipe    Quit date: 11/29/1976    Years since quitting: 46.6   Smokeless tobacco: Never  Vaping Use   Vaping status: Never Used  Substance and Sexual Activity   Alcohol use: Yes     Alcohol/week: 0.0 standard drinks of alcohol    Comment: Occasional drink    Drug use: Never   Sexual activity: Not on file  Other Topics Concern   Not on file  Social History Narrative   Not on file   Social Drivers of Health   Financial Resource Strain: Not on file  Food Insecurity: No Food Insecurity (12/06/2022)   Hunger Vital Sign    Worried About Running Out of Food in the Last Year: Never true    Ran Out of Food in the Last Year: Never true  Transportation Needs: No Transportation Needs (12/06/2022)   PRAPARE - Administrator, Civil Service (Medical): No    Lack of Transportation (Non-Medical): No  Physical Activity: Not on file  Stress: Not on file  Social Connections: Not on file  Intimate Partner Violence: Not At Risk (12/06/2022)   Humiliation, Afraid, Rape, and Kick questionnaire    Fear of Current or Ex-Partner: No    Emotionally Abused: No    Physically Abused: No    Sexually Abused: No   Family History:  Family History  Problem Relation Age of Onset   Heart attack Mother    Stroke Father    Colon cancer Neg Hx    Esophageal cancer Neg Hx    Pancreatic cancer Neg Hx    Rectal cancer Neg Hx    Stomach cancer Neg Hx    Colon polyps Neg Hx     Review of Systems: Constitutional: Doesn't report fevers, chills or abnormal weight loss Eyes: Doesn't report blurriness of vision Ears, nose, mouth, throat, and face: Doesn't report sore throat Respiratory: Doesn't report cough, dyspnea or wheezes Cardiovascular: Doesn't report palpitation, chest discomfort  Gastrointestinal:  Doesn't report nausea, constipation, diarrhea GU: Doesn't report incontinence Skin: Doesn't report skin rashes Neurological: Per HPI Musculoskeletal: Doesn't report joint pain Behavioral/Psych: Doesn't report anxiety  Physical Exam: There were no vitals filed for this visit.  KPS: 70. General: Alert, cooperative, pleasant, in no acute distress Head: Normal EENT: No  conjunctival injection or scleral icterus.  Lungs: Resp effort normal Cardiac: Regular rate Abdomen: Non-distended abdomen Skin: No rashes cyanosis or petechiae. Extremities: No clubbing or edema  Neurologic Exam: Mental Status: Awake, alert, attentive to examiner. Oriented to self and environment. Language is fluent with intact comprehension.  Cranial Nerves: Visual acuity is grossly normal. Visual fields are full. Extra-ocular movements intact. No ptosis. Face is symmetric Motor: Tone and bulk are normal. Power is full in both arms and legs. Reflexes are symmetric, no pathologic reflexes present.  Sensory: Intact to light touch Gait: Orthopedic dystaxia   Labs: I have reviewed the data as listed    Component Value Date/Time   NA 140 06/02/2023 1033   NA 139 09/02/2022 1000   K 4.0 06/02/2023 1033   CL 110 06/02/2023 1033   CO2 24 06/02/2023 1033   GLUCOSE 186 (H) 06/02/2023 1033   BUN 25 (H) 06/02/2023 1033   BUN 29 (H) 09/02/2022 1000   CREATININE 1.75 (H) 06/02/2023 1033   CREATININE 1.17 04/23/2020 0000   CALCIUM  9.4 06/02/2023 1033   PROT 6.8 06/02/2023 1033   ALBUMIN  4.2 06/02/2023 1033   AST 11 (L) 06/02/2023 1033   ALT 8 06/02/2023 1033   ALKPHOS 59 06/02/2023 1033   BILITOT 0.6 06/02/2023 1033   GFRNONAA 39 (L) 06/02/2023 1033   GFRAA >60 09/27/2015 1348   Lab Results  Component Value Date   WBC 7.4 06/02/2023   NEUTROABS 5.8 06/02/2023   HGB 11.1 (L) 06/02/2023   HCT 34.7 (L) 06/02/2023   MCV 83.0 06/02/2023   PLT 246 06/02/2023    Imaging:  MR BRAIN W WO CONTRAST Result Date: 07/13/2023 CLINICAL DATA:  Brain/CNS neoplasm, assess treatment response EXAM: MRI HEAD WITHOUT AND WITH CONTRAST TECHNIQUE: Multiplanar, multiecho pulse sequences of the brain and surrounding structures were obtained without and with intravenous contrast. CONTRAST:  8mL GADAVIST  GADOBUTROL  1 MMOL/ML IV SOLN COMPARISON:  MRI of the brain dated May 25, 2023. FINDINGS: Brain: The  focal area of wispy, nodular enhancement described along the posterior aspect of the cystic lesion within the right frontal lobe is no longer present. The cystic lesion present anteromedially within the right frontal lobe is otherwise unchanged in appearance in the interim. There is intrinsic T1 signal hyperintensity along the margin of the cavity, which is stable and presumed to represent laminar necrosis. There are diffuse, confluent zones of T2 signal present throughout the cerebral white matter bilaterally there is no adverse interval change. Vascular: Normal vascular flow voids. Skull and upper cervical spine: Normal bone marrow signal. No osseous lesions. Sinuses/Orbits: No acute process. Other: None. IMPRESSION: 1. Interval resolution of a nodular area of enhancement along the posterolateral margin of a cystic lesion within the right anterior frontal lobe, compatible with successfully treated neoplasm. There is no apparent residual enhancing tumor present. 2. Extensive cerebral gliosis, likely treatment related. Electronically Signed   By: Evalene Coho M.D.   On: 07/13/2023 11:38    [facility] Clinician Interpretation: I have personally reviewed the radiological images as listed.  My interpretation, in the context of the patient's clinical presentation, is stable disease   Assessment/Plan CNS lymphoma  Roy [name] is clinically stable today.  MRI brain demonstrates resolution of enhancing focus adjacent to primary tumor site, c/w treatment effect rather than recurrence.     Recommended continued MRI surveillance, with repeat study in 3 months.  They are agreeable with this plan.  Will con't to follow with Dr. Onesimo.  We appreciate the opportunity to participate in the care of Roy [name].   We ask that Roy [name] return to clinic in 3 months following next brain MRI, or sooner as needed.  All questions were answered. The patient knows to call the clinic with any problems,  questions or  concerns. No barriers to learning were detected.  The total time spent in the encounter was 40 minutes and more than 50% was on counseling and review of test results   Arthea MARLA Manns, MD Medical Director of Neuro-Oncology [facility] 07/18/23 2:56 PM

## 2023-07-31 ENCOUNTER — Telehealth: Payer: Self-pay | Admitting: Internal Medicine

## 2023-07-31 NOTE — Telephone Encounter (Signed)
 Scheduled appointment per 6/24 los. Talked with the patients wife and she is aware of the made appointments for the patient.

## 2023-08-15 ENCOUNTER — Other Ambulatory Visit: Payer: Self-pay

## 2023-08-15 DIAGNOSIS — D500 Iron deficiency anemia secondary to blood loss (chronic): Secondary | ICD-10-CM

## 2023-08-15 DIAGNOSIS — D472 Monoclonal gammopathy: Secondary | ICD-10-CM

## 2023-08-15 NOTE — Progress Notes (Signed)
 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 male here for continued evaluation and management of his primary testicular large B-cell lymphoma.  Patient was last seen by me on 06/02/2023 and reported short-term memory issues every once in a while, fluctuation in his sleeping habits due to urinary frequency at night, sense of coldness, issues with visual depth perception, and issues lifting his feet.  He was most recently seen by Buckley Hussar, MD on 07/18/2023 and complained of some fatigue, short-term memory loss, occasional loss of function with both legs causing balance issues and 1 incident of fall. Per Dr. Eward note, 07/13/2023 MRI showed resolution of enhancing focus adjacent to primary tumor site, c/w treatment effect rather than recurrence. Dr. Buckley recommended MRI surveillance with repeat study in 3 months.  He is accompanied by his wife during today's visit. Patient complains of continued leg weakness, which is fairly stable.   Patient reports that he had a fall on 08/05/2023 while getting out of a pick-up truck. He reports that he fell on pine needles/pine cones/grass and denies any contact with hard ground such as concrete with his fall. He reports that his fall was due to moving too quickly while trying to get out of the truck and inability to hold onto the door in time. He notes that during his fall, he turned such that he would break the fall on his back rather than his face. Patient reports that he mildly scraped his left knee. He reports that his left shoulder is mildly sore  from his fall but denies any movement limitations in the left arm. Patient denies any significant pain from his fall, but does note some pain in the mornings lasting a few minutes at a time.   He also reports some pain occasionally on the right side of his upper nose, which he notes is on the side that his tumor was on. Patient describes sensation which he describes as something going through it, which typically lasts 5-10 seconds at a time, and sometimes presents quicker. Patient denies any concern for rash or shingles.   Patient denies any SOB, new lump/bumps, fever, chills, night sweats, or sleeping issues.   He denies dizziness necessarily, but does note that he sometimes needs to stand in place for a minute after standing to re-orient himself.   He reports that his blood sugars are stable at home. His wife notes that he no longer receiving insulin  injections.  Patient generally uses a cane, but notes that he used a walker for some time following his fall. He has since returned to using a cane. Patient denies any concern for near misses in regards to falls.   He denies any new skin concerns from a dermatology standpoint. He denies any new heart concerns  Patient reports that he has been eating fairly well. He notes mild occasional bowel issues.  Patient reports that he still endorses short-term memory issues.   He reports that he still has urinary frequency issues at night.  Patient reports that his next MRI with Dr. Buckley is planned for sometime around October/November. He notes that Dr. Buckley recommended  repeat MRIs every 3 months.    His wife notes that patient generally drives about a 3-mile distance from his home. Patient reports that Dr. Buckley is okay with patient driving. We discussed that the decision of whether or not he should drive is managed by Dr. Buckley.   He notes that he is able to complete daily activities at home including laundry and dishes. Patient continues to  enjoy cooking regularly.  He notes that he plans to travel to the [place] in early October.  MEDICAL HISTORY:  Past Medical History:  Diagnosis Date   Allergic rhinitis    Arthritis    wrists   Cardiomyopathy, nonischemic [facility])    followed by cardiology--- dr maranda---  2016 ef 45-50% ,  2016 nuclear ef 37%,  2017 per echo ef 50-55%   CHF (congestive heart failure), NYHA class III (HCC) 03/21/2020   Coronary artery disease    GERD (gastroesophageal reflux disease)    Hiatal hernia    History of kidney stones    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;   01/ 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   History of syncope (03-06-2020 pt stated has not had sycopal episode in few years, stated it seems to happen in extreme hot conditions)   cardiologist--- dr lois maranda--- dx recurrent syncope;  nuclear study 11-03-2014 intermediate risk w/ no ischemia, apical hypokinesis, nuclear ef 37%;  event monitor-- 12-21-2015 SB/ ST  no pauses/ arrythmia's;  echo 08-03-2015 ef 50-55%   Hyperlipidemia    Hypertension    followed by pcp   Hypovitaminosis D    Mass of left testicle    Nocturia    Plantar fasciitis    Presence of surgical incision    01/ 2022  moh's w/ skin graft of nose, per pt still healing and wear bandage daily   Type 2 diabetes mellitus (HCC)    pt is adament that he is not and have been told he is a diabetic but a borderline;  followed by pcp, in pcp note states DM2 and takes 2 meds daily   Wears glasses    Wears hearing aid in both ears    Weight loss 11/16/2020    SURGICAL HISTORY: Past Surgical History:  Procedure Laterality Date   COLONOSCOPY  last one 01-30-2017   CORONARY ARTERY BYPASS GRAFT N/A 03/25/2020   Procedure: CORONARY ARTERY BYPASS GRAFTING (CABG), ON PUMP, TIMES FOUR, USING BILATERAL INTERNAL MAMMARY ARTERIES AND LEFT RADIAL ARTERY;  Surgeon: German Bartlett PEDLAR, MD;  Location: [facility] OR;  Service: Open Heart  Surgery;  Laterality: N/A;   ICD IMPLANT N/A 08/23/2021   Procedure: ICD IMPLANT;  Surgeon: Waddell Danelle ORN, MD;  Location: [facility] INVASIVE CV LAB;  Service: Cardiovascular;  Laterality: N/A;   IR IMAGING GUIDED PORT INSERTION  07/06/2020   LOW ANTERIOR RESECTION RECTUM W/ COLOPROCTOSTOMY  05/2003   MOHS SURGERY  01/2020   nose w/ graft   ORCHIECTOMY Left 03/11/2020   Procedure: CASSAUNDRA FONTANA;  Surgeon: Devere Lonni Righter, MD;  Location: [facility];  Service: Urology;  Laterality: Left;  ONLY NEEDS 60 MIN   PORTA CATH REMOVAL N/A 08/23/2021   Procedure: PORTA CATH REMOVAL;  Surgeon: Waddell Danelle ORN, MD;  Location: [facility] INVASIVE CV LAB;  Service: Cardiovascular;  Laterality: N/A;   RADIAL ARTERY HARVEST Left 03/25/2020   Procedure: LEFT RADIAL ARTERY HARVEST;  Surgeon: German Bartlett PEDLAR, MD;  Location: [facility] OR;  Service: Open Heart Surgery;  Laterality: Left;   RIGHT/LEFT HEART CATH AND CORONARY ANGIOGRAPHY N/A 03/23/2020   Procedure: RIGHT/LEFT HEART CATH AND CORONARY ANGIOGRAPHY;  Surgeon: Dann Candyce RAMAN, MD;  Location[facility] INVASIVE CV LAB;  Service: Cardiovascular;  Laterality: N/A;   SHOULDER SURGERY Right 1992; 07/ 2021   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 Left 12/24/2008   left ear and nose    TEE WITHOUT CARDIOVERSION N/A 03/25/2020   Procedure: TRANSESOPHAGEAL ECHOCARDIOGRAM (TEE);  Surgeon: German Bartlett PEDLAR, MD;  Location: [facility] OR;  Service: Open Heart Surgery;  Laterality: N/A;   TONSILLECTOMY AND ADENOIDECTOMY  child   UPPER GASTROINTESTINAL ENDOSCOPY  last one 06-06-2017    SOCIAL HISTORY: Social History   Socioeconomic History   Marital status: Married    Spouse name: Not on file   Number of children: Not on file   Years of education: Not on file   Highest education level: Not on file  Occupational History   Not on file  Tobacco Use   Smoking status: Former    Types: Pipe    Quit date: 11/29/1976    Years since quitting: 46.7    Smokeless tobacco: Never  Vaping Use   Vaping status: Never Used  Substance and Sexual Activity   Alcohol use: Yes    Alcohol/week: 0.0 standard drinks of alcohol    Comment: Occasional drink    Drug use: Never   Sexual activity: Not on file  Other Topics Concern   Not on file  Social History Narrative   Not on file   Social Drivers of Health   Financial Resource Strain: Not on file  Food Insecurity: No Food Insecurity (12/06/2022)   Hunger Vital Sign    Worried About Running Out of Food in the Last Year: Never true    Ran Out of Food in the Last Year: Never true  Transportation Needs: No Transportation Needs (12/06/2022)   PRAPARE - Administrator, Civil Service (Medical): No    Lack of Transportation (Non-Medical): No  Physical Activity: Not on file  Stress: Not on file  Social Connections: Not on file  Intimate Partner Violence: Not At Risk (12/06/2022)   Humiliation, Afraid, Rape, and Kick questionnaire    Fear of Current or Ex-Partner: No    Emotionally Abused: No    Physically Abused: No    Sexually Abused: No    FAMILY HISTORY: Family History  Problem Relation Age of Onset   Heart attack Mother    Stroke Father    Colon cancer Neg Hx    Esophageal cancer Neg Hx    Pancreatic cancer Neg Hx    Rectal cancer Neg Hx    Stomach cancer Neg Hx    Colon polyps Neg Hx     ALLERGIES:  has no known allergies.  MEDICATIONS:  Current Outpatient Medications  Medication Sig Dispense Refill   aspirin  EC 81 MG tablet Take 81 mg by mouth daily. Swallow whole.     B-Complex TABS Take 1 tablet by mouth daily.     Cholecalciferol  (VITAMIN D ) 50 MCG (2000 UT) CAPS Take 2,000 Units by mouth daily. Monday-Friday     empagliflozin  (JARDIANCE ) 10 MG TABS tablet TAKE 1 TABLET BY MOUTH DAILY BEFORE BREAKFAST. 90 tablet 3   feeding supplement (ENSURE ENLIVE / ENSURE PLUS) LIQD Take 237 mLs by mouth 2 (two) times daily between meals. 237 mL 12   glucosamine-chondroitin  500-400 MG  tablet Take 1 tablet by mouth See admin instructions. 5 times weekly     metoprolol  succinate (TOPROL -XL) 50 MG 24 hr tablet Take 1 tablet (50 mg total) by mouth daily. Take with or immediately following a meal. 30 tablet 2   Multiple Vitamin (MULTIVITAMIN WITH MINERALS) TABS tablet Take 1 tablet by mouth See admin instructions. 5 times weekly     omeprazole  (PRILOSEC) 20 MG capsule Take 20 mg by mouth daily.     spironolactone  (ALDACTONE ) 25 MG tablet TAKE 1/2 TABLET (12.5 MG TOTAL) BY MOUTH DAILY. 30 tablet 2   insulin  aspart (NOVOLOG ) 100 UNIT/ML injection Inject 5 Units into the skin 3 (three) times daily with meals. (Patient not taking: Reported on 08/16/2023) 10 mL 11   insulin  glargine-yfgn (SEMGLEE ) 100 UNIT/ML injection Inject 0.22 mLs (22 Units total) into the skin daily. (Patient not taking: Reported on 08/16/2023) 10 mL 11   No current facility-administered medications for this visit.    REVIEW OF SYSTEMS:    10 Point review of Systems was done is negative except as noted above.   PHYSICAL EXAMINATION: ECOG PERFORMANCE STATUS: 2 - Symptomatic, <50% confined to bed  .BP 131/84   Pulse 90   Temp (!) 97.5 F (36.4 C)   Resp 18   Wt 184 lb 9.6 oz (83.7 kg)   SpO2 100%   BMI 25.39 kg/m   GENERAL:alert, in no acute distress and comfortable SKIN: no acute rashes, no significant lesions EYES: conjunctiva are pink and non-injected, sclera anicteric OROPHARYNX: MMM, no exudates, no oropharyngeal erythema or ulceration NECK: supple, no JVD LYMPH:  no palpable lymphadenopathy in the cervical, axillary or inguinal regions LUNGS: clear to auscultation b/l with normal respiratory effort HEART: regular rate & rhythm ABDOMEN:  normoactive bowel sounds , non tender, not distended. Extremity: no pedal edema PSYCH: alert & oriented x 3 with fluent speech NEURO: no focal motor/sensory deficits   LABORATORY DATA:  I have reviewed the data as listed      Latest Ref Rng &  Units 08/16/2023    2:08 PM 06/02/2023   10:33 AM 03/17/2023    2:31 PM  CBC  WBC 4.0 - 10.5 K/uL 8.6  7.4  8.6   Hemoglobin 13.0 - 17.0 g/dL 88.3  88.8  88.4   Hematocrit 39.0 - 52.0 % 36.9  34.7  36.3   Platelets 150 - 400 K/uL 264  246  284     .    Latest Ref Rng & Units 08/16/2023    2:08 PM 06/02/2023   10:33 AM 03/17/2023    2:31 PM  CMP  Glucose 70 - 99 mg/dL 865  813  872   BUN 8 - 23 mg/dL 27  25  19    Creatinine 0.61 - 1.24 mg/dL 8.24  8.24  8.50   Sodium 135 - 145 mmol/L 143  140  139   Potassium 3.5 - 5.1 mmol/L 4.1  4.0  4.2   Chloride 98 - 111 mmol/L 111  110  107   CO2 22 - 32 mmol/L 24  24  25    Calcium  8.9 - 10.3 mg/dL 9.6  9.4  9.8   Total Protein 6.5 - 8.1 g/dL 7.1  6.8  6.6   Total Bilirubin 0.0 - 1.2 mg/dL 0.6  0.6  0.6   Alkaline Phos 38 - 126 U/L 71  59  61   AST 15 - 41 U/L 12  11  13    ALT 0 -  44 U/L 9  8  8     . Lab Results  Component Value Date   LDH 129 08/16/2023   SURGICAL PATHOLOGY   THIS IS AN ADDENDUM REPORT   CASE: [id number]  PATIENT: Roy [name]  Surgical Pathology Report  Addendum    Reason for Addendum #1:  Molecular Genetic Test Results, FISH   Clinical History: Left testicular mass (crm)      FINAL MICROSCOPIC DIAGNOSIS:   A. TESTICLE, LEFT, ORCHIECTOMY:  -  Diffuse aggressive large B-cell lymphoma  -  See comment    COMMENT:   Sections of testicle show architectural effacement by sheets of large  lymphoid cells with vesicular nuclei and pale cytoplasm.  There is  admixed apoptotic debris and increased mitotic activity.  By  immunohistochemistry, the large lymphoid cells are positive for CD20,  CD5 (dim), BCL6 (dim), MUM1, and BCL2.  They are negative for CD10, CD30  (<1%), cyclin D1, and TdT.  The Ki67 proliferation index is up to  approximately 70%.  CD3 highlights small T-cells in the background. EBV  is negative by in situ hybridization.   Together, the findings support the diagnosis of a diffuse aggressive   large B-cell lymphoma. The differential diagnosis includes a diffuse  large B-cell lymphoma, NOS, with activated B-cell subtype by the [facility]  algorithm and high-grade B-cell lymphoma with MYC and BCL2 or BCL6  rearrangements.  FISH for BCL2, BCL6, and MYC rearrangements will be  performed. Correlation with clinical and radiographic findings is  recommended for consideration of a primary testicular lymphoma.   Result reported to L. Gibson on 03/16/20 at 1720 by S. O'Neill.    ADDENDUM:   FISH RESULTS:   Results: NORMAL   Interpretation:   BCL6 rearrangement:      Not Detected  MYC rearrangement:  Not Detected  MYC amplification:  Not Detected  BCL6 rearrangement:      Not Detected   Please note this testing was performed and interpreted by an outside  facility ([facility]).  This addendum is only being added to provide a  summary of the results for report completeness.  Please see electronic  medical record for a copy of the full report.   RADIOGRAPHIC STUDIES: I have personally reviewed the radiological images as listed and agreed with the findings in the report. No results found.   ASSESSMENT & PLAN:   78 year old wonderful gentleman with history of hypertension, borderline diabetes, dyslipidemia, GERD with recent STEMI on 03/21/2020 and status post four-vessel CABG on 03/25/2020.  1) Left-sided primary testicular large B-cell lymphoma.- currently in remission S/p ipsilateral orchiectomy S/p 6 cycles of R-CEOP and 3 dose of IT MTX (for CNS prophylaxis) and R Tto contralateral testis. BCL-2, BCL 6, c-Myc negative.  2) history of acute myocardial infarction status post CABG x4 (06/26/2020) 3) nonischemic and ischemic cardiomyopathy ejection fraction 25 to 30% on last echo. 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 CBC normal, showed WBC of 8.6K, hemoglobin of 11.6, and platelets of 264K. -CMP stable, shows CKD -recommend staying well hydrated -07/13/2023 Brain MRI shows interval resolution of a nodular area of enhancement along the posterolateral margin of a cystic lesion within the right anterior frontal lobe, compatible with successfully treated neoplasm. There is no apparent residual enhancing tumor present. There are  no new findings.  -did not feel any enlarged lymph nodes during physical exam -we discussed that his nose pain is unlikely to be related to his tumor. It is possible that there may have been some local nerve injury in the area from his previous cancer surgery. -patient has no symptoms suggestive of lymphoma recurrence at this time -recommend continued use of cane to help avoid falls -Dr. Buckley recommended continued MRI surveillance, with repeat study 3 months after his last brain MRI -We discussed that the decision of whether or not he should drive is managed by Dr. Buckley.  -patient shall return to clinic in 4 months. -If findings are stable in 4 months, we will plan to extend visits to every 6 months  FOLLOW-UP: RTC with Dr Onesimo with labs in 4 months  The total time spent in the appointment was 21 minutes* .  All of the patient's questions were answered with apparent satisfaction. The patient knows to call the clinic with any problems, questions or concerns.   Emaline Onesimo MD MS AAHIVMS [facility] Hematology/Oncology Physician [facility]  .*Total Encounter Time as defined by the Centers for Medicare and Medicaid Services includes, in addition to the face-to-face time of a patient visit (documented in the note above) non-face-to-face time: obtaining and reviewing outside history, ordering and reviewing medications, tests or procedures, care coordination (communications with other health care professionals or caregivers) and  documentation in the medical record.    I,Mitra Faeizi,acting as a Neurosurgeon for Emaline Onesimo, MD.,have documented all relevant documentation on the behalf of Emaline Onesimo, MD,as directed by  Emaline Onesimo, MD while in the presence of Emaline Onesimo, MD.  .I have reviewed the above documentation for accuracy and completeness, and I agree with the above. .Braelen Sproule Kishore Rosario Kushner MD

## 2023-08-16 ENCOUNTER — Inpatient Hospital Stay: Admitting: Hematology

## 2023-08-16 ENCOUNTER — Inpatient Hospital Stay: Attending: Hematology

## 2023-08-16 VITALS — BP 131/84 | HR 90 | Temp 97.5°F | Resp 18 | Wt 184.6 lb

## 2023-08-16 DIAGNOSIS — C851A Unspecified b-cell lymphoma, in remission: Secondary | ICD-10-CM | POA: Diagnosis not present

## 2023-08-16 DIAGNOSIS — C8330 Diffuse large B-cell lymphoma, unspecified site: Secondary | ICD-10-CM | POA: Diagnosis not present

## 2023-08-16 DIAGNOSIS — I255 Ischemic cardiomyopathy: Secondary | ICD-10-CM | POA: Diagnosis not present

## 2023-08-16 DIAGNOSIS — Z951 Presence of aortocoronary bypass graft: Secondary | ICD-10-CM | POA: Insufficient documentation

## 2023-08-16 DIAGNOSIS — Z87891 Personal history of nicotine dependence: Secondary | ICD-10-CM | POA: Insufficient documentation

## 2023-08-16 DIAGNOSIS — C8339 Primary central nervous system lymphoma: Secondary | ICD-10-CM

## 2023-08-16 DIAGNOSIS — I252 Old myocardial infarction: Secondary | ICD-10-CM | POA: Diagnosis not present

## 2023-08-16 DIAGNOSIS — D472 Monoclonal gammopathy: Secondary | ICD-10-CM

## 2023-08-16 DIAGNOSIS — E785 Hyperlipidemia, unspecified: Secondary | ICD-10-CM | POA: Insufficient documentation

## 2023-08-16 DIAGNOSIS — K219 Gastro-esophageal reflux disease without esophagitis: Secondary | ICD-10-CM | POA: Diagnosis not present

## 2023-08-16 DIAGNOSIS — N189 Chronic kidney disease, unspecified: Secondary | ICD-10-CM | POA: Insufficient documentation

## 2023-08-16 DIAGNOSIS — I1310 Hypertensive heart and chronic kidney disease without heart failure, with stage 1 through stage 4 chronic kidney disease, or unspecified chronic kidney disease: Secondary | ICD-10-CM | POA: Insufficient documentation

## 2023-08-16 DIAGNOSIS — Z85828 Personal history of other malignant neoplasm of skin: Secondary | ICD-10-CM | POA: Insufficient documentation

## 2023-08-16 DIAGNOSIS — D500 Iron deficiency anemia secondary to blood loss (chronic): Secondary | ICD-10-CM

## 2023-08-16 LAB — CMP (CANCER CENTER ONLY)
ALT: 9 U/L (ref 0–44)
AST: 12 U/L — ABNORMAL LOW (ref 15–41)
Albumin: 4.1 g/dL (ref 3.5–5.0)
Alkaline Phosphatase: 71 U/L (ref 38–126)
Anion gap: 8 (ref 5–15)
BUN: 27 mg/dL — ABNORMAL HIGH (ref 8–23)
CO2: 24 mmol/L (ref 22–32)
Calcium: 9.6 mg/dL (ref 8.9–10.3)
Chloride: 111 mmol/L (ref 98–111)
Creatinine: 1.75 mg/dL — ABNORMAL HIGH (ref 0.61–1.24)
GFR, Estimated: 39 mL/min — ABNORMAL LOW (ref >60)
Glucose, Bld: 134 mg/dL — ABNORMAL HIGH (ref 70–99)
Potassium: 4.1 mmol/L (ref 3.5–5.1)
Sodium: 143 mmol/L (ref 135–145)
Total Bilirubin: 0.6 mg/dL (ref 0.0–1.2)
Total Protein: 7.1 g/dL (ref 6.5–8.1)

## 2023-08-16 LAB — CBC WITH DIFFERENTIAL (CANCER CENTER ONLY)
Abs Immature Granulocytes: 0.02 K/uL (ref 0.00–0.07)
Basophils Absolute: 0.1 K/uL (ref 0.0–0.1)
Basophils Relative: 1 %
Eosinophils Absolute: 0.2 K/uL (ref 0.0–0.5)
Eosinophils Relative: 2 %
HCT: 36.9 % — ABNORMAL LOW (ref 39.0–52.0)
Hemoglobin: 11.6 g/dL — ABNORMAL LOW (ref 13.0–17.0)
Immature Granulocytes: 0 %
Lymphocytes Relative: 12 %
Lymphs Abs: 1 K/uL (ref 0.7–4.0)
MCH: 26.7 pg (ref 26.0–34.0)
MCHC: 31.4 g/dL (ref 30.0–36.0)
MCV: 85 fL (ref 80.0–100.0)
Monocytes Absolute: 0.6 K/uL (ref 0.1–1.0)
Monocytes Relative: 7 %
Neutro Abs: 6.7 K/uL (ref 1.7–7.7)
Neutrophils Relative %: 78 %
Platelet Count: 264 K/uL (ref 150–400)
RBC: 4.34 MIL/uL (ref 4.22–5.81)
RDW: 14.5 % (ref 11.5–15.5)
WBC Count: 8.6 K/uL (ref 4.0–10.5)
nRBC: 0 % (ref 0.0–0.2)

## 2023-08-16 LAB — LACTATE DEHYDROGENASE: LDH: 129 U/L (ref 98–192)

## 2023-08-22 ENCOUNTER — Encounter: Payer: Self-pay | Admitting: Hematology

## 2023-08-31 ENCOUNTER — Ambulatory Visit: Payer: Medicare HMO

## 2023-08-31 DIAGNOSIS — I428 Other cardiomyopathies: Secondary | ICD-10-CM

## 2023-09-01 LAB — CUP PACEART REMOTE DEVICE CHECK
Date Time Interrogation Session: 20250807120632
Implantable Lead Connection Status: 753985
Implantable Lead Implant Date: 20230731
Implantable Lead Location: 753860
Implantable Lead Model: 436909
Implantable Lead Serial Number: 81532339
Implantable Pulse Generator Implant Date: 20230731
Pulse Gen Model: 429525

## 2023-09-02 ENCOUNTER — Ambulatory Visit: Payer: Self-pay | Admitting: Internal Medicine

## 2023-10-03 ENCOUNTER — Ambulatory Visit (HOSPITAL_COMMUNITY)
Admission: RE | Admit: 2023-10-03 | Discharge: 2023-10-03 | Disposition: A | Discharge status: Home / Self Care | Source: Ambulatory Visit | Attending: Internal Medicine | Admitting: Internal Medicine

## 2023-10-03 DIAGNOSIS — R9089 Other abnormal findings on diagnostic imaging of central nervous system: Secondary | ICD-10-CM | POA: Diagnosis not present

## 2023-10-03 DIAGNOSIS — C8339 Primary central nervous system lymphoma: Secondary | ICD-10-CM | POA: Insufficient documentation

## 2023-10-03 DIAGNOSIS — G939 Disorder of brain, unspecified: Secondary | ICD-10-CM | POA: Diagnosis not present

## 2023-10-03 MED ORDER — GADOBUTROL 1 MMOL/ML IV SOLN
9.0000 mL | Freq: Once | INTRAVENOUS | Status: AC | PRN
  Administered 2023-10-03: 9 mL via INTRAVENOUS

## 2023-10-03 NOTE — Progress Notes (Signed)
 Patient was monitored by this RN during MRI scan due to presence of a pacemaker. Cardiac rhythm was continuously monitored throughout the procedure. Prior to the start of the scan, the pacemaker was placed in MRI-safe mode by the MRI technician and/or pacemaker representative. Following the completion of the scan, the device was returned to its pre-MRI settings. Neurological status and orientation post-procedure were unchanged from baseline.   Pre-procedure Heart Rate (Prior to being placed in MRI safe mode): intrinsic HR of 80 Post-procedure Heart Rate (Once pacemaker is returned to baseline mode): intrinsic HR of 73

## 2023-10-17 ENCOUNTER — Inpatient Hospital Stay: Attending: Hematology | Admitting: Internal Medicine

## 2023-10-17 VITALS — BP 126/87 | HR 96 | Temp 97.5°F | Resp 18 | Wt 181.5 lb

## 2023-10-17 DIAGNOSIS — C851A Unspecified b-cell lymphoma, in remission: Secondary | ICD-10-CM | POA: Insufficient documentation

## 2023-10-17 DIAGNOSIS — C8339 Primary central nervous system lymphoma: Secondary | ICD-10-CM

## 2023-10-17 DIAGNOSIS — Z87891 Personal history of nicotine dependence: Secondary | ICD-10-CM | POA: Insufficient documentation

## 2023-10-17 NOTE — Progress Notes (Signed)
 [facility] at [facility] 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   07/13/2020 - 11/25/2020 Chemotherapy   Patient is on Treatment Plan : NON-HODGKIN'S LYMPHOMA R-CEOP Q21D     02/09/2021 Cancer Staging   Staging form: Hodgkin and Non-Hodgkin Lymphoma, AJCC 8th Edition - Clinical: Stage IE (Diffuse large B-cell lymphoma) - Signed by Onesimo Emaline Brink, MD on 02/09/2021   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  12/31/2020 Initial Diagnosis   Diffuse large b-cell lymphoma, extranodal and solid organ sites [facility])   02/09/2021 Cancer Staging   Staging form: Hodgkin and Non-Hodgkin Lymphoma, AJCC 8th Edition - Clinical: Stage IE (Diffuse large B-cell lymphoma) - Signed by Onesimo Emaline Brink, MD on 02/09/2021 Stage prefix: Initial diagnosis     CNS Oncologic History 01/17/23: Right frontal secondary lymphoma, completes WBRT with Dr. Patrcia, 17 fractions  Interval History: LONZIE SIMMER presents after recent MRI brain study.  Doesn't report any new or progressive changes today.  Continues to have issues with memory, overall energy.  Remains active physically, planning to go to beach next month.  Denies headaches, seizures.  H+P (06/13/23) Patient presents today for evaluation  following recent MRI brain.  He describes some issues with fatigue and short term memory loss, more prominent than prior to radiation.  He also has been experiencing occasional episodes of loss of function with both legs, leading to impaired balance and one notable fall.  No frank seizures or headaches.  He continues to otherwise follow with Dr. Onesimo, remains on observation and in remission for systemic B-cell lymphoma.  Medications: Current Outpatient Medications on File Prior to Visit  Medication Sig Dispense Refill   aspirin  EC 81 MG tablet Take 81 mg by mouth daily. Swallow whole.     B-Complex TABS Take 1 tablet by mouth daily.     Cholecalciferol  (VITAMIN D ) 50 MCG (2000 UT) CAPS Take 2,000 Units by mouth daily. Monday-Friday     empagliflozin  (JARDIANCE ) 10 MG TABS tablet TAKE 1 TABLET BY MOUTH DAILY BEFORE BREAKFAST. 90 tablet 3   feeding supplement (ENSURE ENLIVE / ENSURE PLUS) LIQD Take 237 mLs by mouth 2 (two) times daily between meals. 237 mL 12   glucosamine-chondroitin 500-400 MG tablet Take 1 tablet by mouth See admin instructions. 5 times weekly     insulin  aspart (NOVOLOG ) 100 UNIT/ML injection Inject 5 Units into the skin 3 (three) times daily with meals. (Patient not taking: Reported on 08/16/2023) 10 mL 11   insulin  glargine-yfgn (SEMGLEE ) 100 UNIT/ML injection Inject 0.22 mLs (22 Units total) into the skin daily. (Patient not taking: Reported on 08/16/2023) 10 mL 11   metoprolol  succinate (TOPROL -XL) 50 MG 24 hr tablet Take 1 tablet (50 mg total) by mouth daily. Take with or immediately following a meal. 30 tablet 2   Multiple Vitamin (MULTIVITAMIN WITH MINERALS) TABS  tablet Take 1 tablet by mouth See admin instructions. 5 times weekly     omeprazole  (PRILOSEC) 20 MG capsule Take 20 mg by mouth daily.     spironolactone  (ALDACTONE ) 25 MG tablet TAKE 1/2 TABLET (12.5 MG TOTAL) BY MOUTH DAILY. 30 tablet 2   No current facility-administered medications on file prior to visit.     Allergies: No Known Allergies Past Medical History:  Past Medical History:  Diagnosis Date   Allergic rhinitis    Arthritis    wrists   Cardiomyopathy, nonischemic (HCC)    followed by cardiology--- dr maranda---  2016 ef 45-50% ,  2016 nuclear ef 37%,  2017 per echo ef 50-55%   CHF (congestive heart failure), NYHA class III (HCC) 03/21/2020   Coronary artery disease    GERD (gastroesophageal reflux disease)    Hiatal hernia    History of kidney stones    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;   01/ 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   History of syncope (03-06-2020 pt stated has not had sycopal episode in few years, stated it seems to happen in extreme hot conditions)   cardiologist--- dr lois maranda--- dx recurrent syncope;  nuclear study 11-03-2014 intermediate risk w/ no ischemia, apical hypokinesis, nuclear ef 37%;  event monitor-- 12-21-2015 SB/ ST  no pauses/ arrythmia's;  echo 08-03-2015 ef 50-55%   Hyperlipidemia    Hypertension    followed by pcp   Hypovitaminosis D    Mass of left testicle    Nocturia    Plantar fasciitis    Presence of surgical incision    01/ 2022  moh's w/ skin graft of nose, per pt still healing and wear bandage daily   Type 2 diabetes mellitus (HCC)    pt is adament that he is not and have been told he is a diabetic but a borderline;  followed by pcp, in pcp note states DM2 and takes 2 meds daily   Wears glasses    Wears hearing aid in both ears    Weight loss 11/16/2020   Past Surgical History:  Past Surgical History:  Procedure Laterality Date   COLONOSCOPY  last one 01-30-2017   CORONARY ARTERY BYPASS GRAFT N/A 03/25/2020   Procedure: CORONARY ARTERY BYPASS GRAFTING (CABG), ON PUMP, TIMES FOUR, USING BILATERAL INTERNAL MAMMARY ARTERIES AND LEFT RADIAL ARTERY;  Surgeon: German Bartlett PEDLAR, MD;  Location: [facility] OR;  Service: Open Heart Surgery;  Laterality: N/A;   ICD IMPLANT N/A 08/23/2021   Procedure: ICD  IMPLANT;  Surgeon: Waddell Danelle ORN, MD;  Location: [facility] INVASIVE CV LAB;  Service: Cardiovascular;  Laterality: N/A;   IR IMAGING GUIDED PORT INSERTION  07/06/2020   LOW ANTERIOR RESECTION RECTUM W/ COLOPROCTOSTOMY  05/2003   MOHS SURGERY  01/2020   nose w/ graft   ORCHIECTOMY Left 03/11/2020   Procedure: CASSAUNDRA FONTANA;  Surgeon: Devere Lonni Righter, MD;  Location: [facility];  Service: Urology;  Laterality: Left;  ONLY NEEDS 60 MIN   PORTA CATH REMOVAL N/A 08/23/2021   Procedure: PORTA CATH REMOVAL;  Surgeon: Waddell Danelle ORN, MD;  Location: [facility] INVASIVE CV LAB;  Service: Cardiovascular;  Laterality: N/A;   RADIAL ARTERY HARVEST Left 03/25/2020   Procedure: LEFT RADIAL ARTERY HARVEST;  Surgeon: German Bartlett PEDLAR, MD;  Location: [facility] OR;  Service: Open Heart Surgery;  Laterality: Left;   RIGHT/LEFT HEART CATH AND CORONARY ANGIOGRAPHY N/A 03/23/2020  Procedure: RIGHT/LEFT HEART CATH AND CORONARY ANGIOGRAPHY;  Surgeon: Dann Candyce RAMAN, MD;  Location: [facility] INVASIVE CV LAB;  Service: Cardiovascular;  Laterality: N/A;   SHOULDER SURGERY Right 1992; 07/ 2021   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 Left 12/24/2008   left ear and nose    TEE WITHOUT CARDIOVERSION N/A 03/25/2020   Procedure: TRANSESOPHAGEAL ECHOCARDIOGRAM (TEE);  Surgeon: German Bartlett PEDLAR, MD;  Location: [facility] OR;  Service: Open Heart Surgery;  Laterality: N/A;   TONSILLECTOMY AND ADENOIDECTOMY  child   UPPER GASTROINTESTINAL ENDOSCOPY  last one 06-06-2017   Social History:  Social History   Socioeconomic History   Marital status: Married    Spouse name: Not on file   Number of children: Not on file   Years of education: Not on file   Highest education level: Not on file  Occupational History   Not on file  Tobacco Use   Smoking status: Former    Types: Pipe    Quit date: 11/29/1976    Years since quitting: 46.9   Smokeless tobacco: Never  Vaping Use   Vaping status: Never Used  Substance  and Sexual Activity   Alcohol use: Yes    Alcohol/week: 0.0 standard drinks of alcohol    Comment: Occasional drink    Drug use: Never   Sexual activity: Not on file  Other Topics Concern   Not on file  Social History Narrative   Not on file   Social Drivers of Health   Financial Resource Strain: Not on file  Food Insecurity: No Food Insecurity (12/06/2022)   Hunger Vital Sign    Worried About Running Out of Food in the Last Year: Never true    Ran Out of Food in the Last Year: Never true  Transportation Needs: No Transportation Needs (12/06/2022)   PRAPARE - Administrator, Civil Service (Medical): No    Lack of Transportation (Non-Medical): No  Physical Activity: Not on file  Stress: Not on file  Social Connections: Not on file  Intimate Partner Violence: Not At Risk (12/06/2022)   Humiliation, Afraid, Rape, and Kick questionnaire    Fear of Current or Ex-Partner: No    Emotionally Abused: No    Physically Abused: No    Sexually Abused: No   Family History:  Family History  Problem Relation Age of Onset   Heart attack Mother    Stroke Father    Colon cancer Neg Hx    Esophageal cancer Neg Hx    Pancreatic cancer Neg Hx    Rectal cancer Neg Hx    Stomach cancer Neg Hx    Colon polyps Neg Hx     Review of Systems: Constitutional: Doesn't report fevers, chills or abnormal weight loss Eyes: Doesn't report blurriness of vision Ears, nose, mouth, throat, and face: Doesn't report sore throat Respiratory: Doesn't report cough, dyspnea or wheezes Cardiovascular: Doesn't report palpitation, chest discomfort  Gastrointestinal:  Doesn't report nausea, constipation, diarrhea GU: Doesn't report incontinence Skin: Doesn't report skin rashes Neurological: Per HPI Musculoskeletal: Doesn't report joint pain Behavioral/Psych: Doesn't report anxiety  Physical Exam: Vitals:   10/17/23 1109  BP: 126/87  Pulse: 96  Resp: 18  Temp: (!) 97.5 F (36.4 C)  SpO2:  99%    KPS: 70. General: Alert, cooperative, pleasant, in no acute distress Head: Normal EENT: No conjunctival injection or scleral icterus.  Lungs: Resp effort normal Cardiac: Regular rate Abdomen: Non-distended abdomen Skin: No rashes cyanosis or petechiae.  Extremities: No clubbing or edema  Neurologic Exam: Mental Status: Awake, alert, attentive to examiner. Oriented to self and environment. Language is fluent with intact comprehension.  Cranial Nerves: Visual acuity is grossly normal. Visual fields are full. Extra-ocular movements intact. No ptosis. Face is symmetric Motor: Tone and bulk are normal. Power is full in both arms and legs. Reflexes are symmetric, no pathologic reflexes present.  Sensory: Intact to light touch Gait: Orthopedic dystaxia   Labs: I have reviewed the data as listed    Component Value Date/Time   NA 143 08/16/2023 1408   NA 139 09/02/2022 1000   K 4.1 08/16/2023 1408   CL 111 08/16/2023 1408   CO2 24 08/16/2023 1408   GLUCOSE 134 (H) 08/16/2023 1408   BUN 27 (H) 08/16/2023 1408   BUN 29 (H) 09/02/2022 1000   CREATININE 1.75 (H) 08/16/2023 1408   CREATININE 1.17 04/23/2020 0000   CALCIUM  9.6 08/16/2023 1408   PROT 7.1 08/16/2023 1408   ALBUMIN  4.1 08/16/2023 1408   AST 12 (L) 08/16/2023 1408   ALT 9 08/16/2023 1408   ALKPHOS 71 08/16/2023 1408   BILITOT 0.6 08/16/2023 1408   GFRNONAA 39 (L) 08/16/2023 1408   GFRAA >60 09/27/2015 1348   Lab Results  Component Value Date   WBC 8.6 08/16/2023   NEUTROABS 6.7 08/16/2023   HGB 11.6 (L) 08/16/2023   HCT 36.9 (L) 08/16/2023   MCV 85.0 08/16/2023   PLT 264 08/16/2023    Imaging:  MR BRAIN W WO CONTRAST Result Date: 10/03/2023 EXAM: MRI BRAIN WITH AND WITHOUT CONTRAST 10/03/2023 02:19:00 PM TECHNIQUE: Multiplanar multisequence MRI of the head/brain was performed with and without the administration of intravenous contrast. COMPARISON: MRI head 07/13/2023 and earlier. CLINICAL HISTORY:  Brain/CNS neoplasm, assess treatment response. FINDINGS: BRAIN AND VENTRICLES: Similar appearance of treated lesion in the anteromedial right frontal lobe within the right superior frontal gyrus. The curvilinear intrinsic T1 hyperintensity along the periphery is similar to prior and may reflect laminar necrosis. No nodular or mass-like enhancement. Similar focus of FLAIR hyperintensity involving the posterior limb of the right internal capsule at the site of additional previously treated lesion. No acute infarct. Similar T2/FLAIR intensity within the anterior right frontal lobe. Additional areas of confluent T2/FLAIR hyperintensity in the periventricular and subcortical white matter which may reflect a combination of treatment related changes and chronic microvascular ischemic changes. Mild parenchymal volume loss. ORBITS: No acute abnormality. SINUSES: No acute abnormality. BONES AND SOFT TISSUES: Small volume fluid in the bilateral mastoid air cells. IMPRESSION: 1. Similar appearance of treated lesion in the anterior right frontal lobe. No nodular or mass-like enhancement. 2. No new intracranial lesion or acute findings. Electronically signed by: Donnice Mania MD 10/03/2023 07:36 PM EDT RP Workstation: [id number]    [facility] Clinician Interpretation: I have personally reviewed the radiological images as listed.  My interpretation, in the context of the patient's clinical presentation, is stable disease   Assessment/Plan CNS lymphoma  LYNELL KUSSMAN is clinically stable today from neurologic standpoint.  MRI brain demonstrates stable findings overall, now 9 months removed from WBRT.  Recommended continued MRI surveillance only.  They are agreeable with this plan.  Will con't to follow with Dr. Onesimo.  We appreciate the opportunity to participate in the care of DIAMONTE STAVELY.   We ask that KELDON LASSEN return to clinic in 3 months following next brain MRI, or sooner as needed.  All questions were  answered. The patient knows to call  the clinic with any problems, questions or concerns. No barriers to learning were detected.  The total time spent in the encounter was 30 minutes and more than 50% was on counseling and review of test results   Arthea MARLA Manns, MD Medical Director of Neuro-Oncology [facility] at [facility] 10/17/23 11:18 AM

## 2023-10-18 ENCOUNTER — Telehealth: Payer: Self-pay | Admitting: Internal Medicine

## 2023-10-18 NOTE — Telephone Encounter (Signed)
 Scheduled appointment per 9/23 los. Talked with the patient and he is aware of the made appointment.

## 2023-10-21 NOTE — Progress Notes (Signed)
 Remote ICD Transmission

## 2023-10-23 NOTE — Addendum Note (Signed)
 Encounter addended by: Thayne Consuelo DEL on: 10/23/2023 9:25 AM  Actions taken: Imaging Exam ended

## 2023-11-08 DIAGNOSIS — N400 Enlarged prostate without lower urinary tract symptoms: Secondary | ICD-10-CM | POA: Diagnosis not present

## 2023-11-08 DIAGNOSIS — C44219 Basal cell carcinoma of skin of left ear and external auricular canal: Secondary | ICD-10-CM | POA: Diagnosis not present

## 2023-11-08 DIAGNOSIS — Z823 Family history of stroke: Secondary | ICD-10-CM | POA: Diagnosis not present

## 2023-11-08 DIAGNOSIS — J309 Allergic rhinitis, unspecified: Secondary | ICD-10-CM | POA: Diagnosis not present

## 2023-11-08 DIAGNOSIS — I2510 Atherosclerotic heart disease of native coronary artery without angina pectoris: Secondary | ICD-10-CM | POA: Diagnosis not present

## 2023-11-08 DIAGNOSIS — K219 Gastro-esophageal reflux disease without esophagitis: Secondary | ICD-10-CM | POA: Diagnosis not present

## 2023-11-08 DIAGNOSIS — Z5982 Transportation insecurity: Secondary | ICD-10-CM | POA: Diagnosis not present

## 2023-11-08 DIAGNOSIS — M1990 Unspecified osteoarthritis, unspecified site: Secondary | ICD-10-CM | POA: Diagnosis not present

## 2023-11-08 DIAGNOSIS — H9193 Unspecified hearing loss, bilateral: Secondary | ICD-10-CM | POA: Diagnosis not present

## 2023-11-08 DIAGNOSIS — Z95810 Presence of automatic (implantable) cardiac defibrillator: Secondary | ICD-10-CM | POA: Diagnosis not present

## 2023-11-08 DIAGNOSIS — Z7982 Long term (current) use of aspirin: Secondary | ICD-10-CM | POA: Diagnosis not present

## 2023-11-08 DIAGNOSIS — I110 Hypertensive heart disease with heart failure: Secondary | ICD-10-CM | POA: Diagnosis not present

## 2023-11-08 DIAGNOSIS — Z9181 History of falling: Secondary | ICD-10-CM | POA: Diagnosis not present

## 2023-11-08 DIAGNOSIS — I255 Ischemic cardiomyopathy: Secondary | ICD-10-CM | POA: Diagnosis not present

## 2023-11-08 DIAGNOSIS — Z85828 Personal history of other malignant neoplasm of skin: Secondary | ICD-10-CM | POA: Diagnosis not present

## 2023-11-08 DIAGNOSIS — I252 Old myocardial infarction: Secondary | ICD-10-CM | POA: Diagnosis not present

## 2023-11-08 DIAGNOSIS — I509 Heart failure, unspecified: Secondary | ICD-10-CM | POA: Diagnosis not present

## 2023-11-08 DIAGNOSIS — E119 Type 2 diabetes mellitus without complications: Secondary | ICD-10-CM | POA: Diagnosis not present

## 2023-11-21 DIAGNOSIS — N1832 Chronic kidney disease, stage 3b: Secondary | ICD-10-CM | POA: Diagnosis not present

## 2023-11-21 DIAGNOSIS — I129 Hypertensive chronic kidney disease with stage 1 through stage 4 chronic kidney disease, or unspecified chronic kidney disease: Secondary | ICD-10-CM | POA: Diagnosis not present

## 2023-11-21 DIAGNOSIS — E7800 Pure hypercholesterolemia, unspecified: Secondary | ICD-10-CM | POA: Diagnosis not present

## 2023-11-21 DIAGNOSIS — Z951 Presence of aortocoronary bypass graft: Secondary | ICD-10-CM | POA: Diagnosis not present

## 2023-11-21 DIAGNOSIS — I5189 Other ill-defined heart diseases: Secondary | ICD-10-CM | POA: Diagnosis not present

## 2023-11-21 DIAGNOSIS — K219 Gastro-esophageal reflux disease without esophagitis: Secondary | ICD-10-CM | POA: Diagnosis not present

## 2023-11-21 DIAGNOSIS — E1159 Type 2 diabetes mellitus with other circulatory complications: Secondary | ICD-10-CM | POA: Diagnosis not present

## 2023-11-21 DIAGNOSIS — Z125 Encounter for screening for malignant neoplasm of prostate: Secondary | ICD-10-CM | POA: Diagnosis not present

## 2023-11-21 DIAGNOSIS — I428 Other cardiomyopathies: Secondary | ICD-10-CM | POA: Diagnosis not present

## 2023-11-27 ENCOUNTER — Other Ambulatory Visit: Payer: Self-pay | Admitting: Internal Medicine

## 2023-11-27 DIAGNOSIS — Z951 Presence of aortocoronary bypass graft: Secondary | ICD-10-CM | POA: Diagnosis not present

## 2023-11-27 DIAGNOSIS — C799 Secondary malignant neoplasm of unspecified site: Secondary | ICD-10-CM | POA: Diagnosis not present

## 2023-11-27 DIAGNOSIS — I428 Other cardiomyopathies: Secondary | ICD-10-CM | POA: Diagnosis not present

## 2023-11-27 DIAGNOSIS — C8330 Diffuse large B-cell lymphoma, unspecified site: Secondary | ICD-10-CM | POA: Diagnosis not present

## 2023-11-27 DIAGNOSIS — D631 Anemia in chronic kidney disease: Secondary | ICD-10-CM | POA: Diagnosis not present

## 2023-11-27 DIAGNOSIS — E1159 Type 2 diabetes mellitus with other circulatory complications: Secondary | ICD-10-CM | POA: Diagnosis not present

## 2023-11-27 DIAGNOSIS — E7800 Pure hypercholesterolemia, unspecified: Secondary | ICD-10-CM | POA: Diagnosis not present

## 2023-11-27 DIAGNOSIS — N1832 Chronic kidney disease, stage 3b: Secondary | ICD-10-CM | POA: Diagnosis not present

## 2023-11-27 DIAGNOSIS — I129 Hypertensive chronic kidney disease with stage 1 through stage 4 chronic kidney disease, or unspecified chronic kidney disease: Secondary | ICD-10-CM | POA: Diagnosis not present

## 2023-11-29 MED ORDER — EMPAGLIFLOZIN 10 MG PO TABS: 10.0000 mg | ORAL_TABLET | Freq: Every day | ORAL | 0 refills | Status: AC

## 2023-11-30 ENCOUNTER — Ambulatory Visit: Payer: Medicare HMO

## 2023-11-30 DIAGNOSIS — I428 Other cardiomyopathies: Secondary | ICD-10-CM

## 2023-11-30 LAB — CUP PACEART REMOTE DEVICE CHECK
Date Time Interrogation Session: 20251106091249
Implantable Lead Connection Status: 753985
Implantable Lead Implant Date: 20230731
Implantable Lead Location: 753860
Implantable Lead Model: 436909
Implantable Lead Serial Number: 81532339
Implantable Pulse Generator Implant Date: 20230731
Pulse Gen Model: 429525
Pulse Gen Serial Number: 84895112

## 2023-12-01 ENCOUNTER — Ambulatory Visit: Payer: Self-pay | Admitting: Internal Medicine

## 2023-12-01 NOTE — Progress Notes (Signed)
 Remote ICD Transmission

## 2023-12-05 ENCOUNTER — Other Ambulatory Visit: Payer: Self-pay

## 2023-12-05 DIAGNOSIS — C8330 Diffuse large B-cell lymphoma, unspecified site: Secondary | ICD-10-CM

## 2023-12-06 ENCOUNTER — Inpatient Hospital Stay: Admitting: Hematology

## 2023-12-06 ENCOUNTER — Inpatient Hospital Stay: Attending: Hematology

## 2023-12-06 VITALS — BP 116/96 | HR 98 | Temp 97.5°F | Resp 18 | Wt 173.3 lb

## 2023-12-06 DIAGNOSIS — C8330 Diffuse large B-cell lymphoma, unspecified site: Secondary | ICD-10-CM

## 2023-12-06 DIAGNOSIS — Z85828 Personal history of other malignant neoplasm of skin: Secondary | ICD-10-CM | POA: Insufficient documentation

## 2023-12-06 DIAGNOSIS — C8339 Primary central nervous system lymphoma: Secondary | ICD-10-CM | POA: Diagnosis not present

## 2023-12-06 DIAGNOSIS — R059 Cough, unspecified: Secondary | ICD-10-CM | POA: Insufficient documentation

## 2023-12-06 DIAGNOSIS — I10 Essential (primary) hypertension: Secondary | ICD-10-CM | POA: Diagnosis not present

## 2023-12-06 DIAGNOSIS — I252 Old myocardial infarction: Secondary | ICD-10-CM | POA: Insufficient documentation

## 2023-12-06 DIAGNOSIS — M6281 Muscle weakness (generalized): Secondary | ICD-10-CM | POA: Insufficient documentation

## 2023-12-06 DIAGNOSIS — E119 Type 2 diabetes mellitus without complications: Secondary | ICD-10-CM | POA: Insufficient documentation

## 2023-12-06 DIAGNOSIS — E785 Hyperlipidemia, unspecified: Secondary | ICD-10-CM | POA: Insufficient documentation

## 2023-12-06 DIAGNOSIS — K219 Gastro-esophageal reflux disease without esophagitis: Secondary | ICD-10-CM | POA: Insufficient documentation

## 2023-12-06 DIAGNOSIS — C851A Unspecified b-cell lymphoma, in remission: Secondary | ICD-10-CM | POA: Insufficient documentation

## 2023-12-06 DIAGNOSIS — I255 Ischemic cardiomyopathy: Secondary | ICD-10-CM | POA: Diagnosis not present

## 2023-12-06 DIAGNOSIS — Z951 Presence of aortocoronary bypass graft: Secondary | ICD-10-CM | POA: Diagnosis not present

## 2023-12-06 LAB — CBC WITH DIFFERENTIAL (CANCER CENTER ONLY)
Abs Immature Granulocytes: 0.02 K/uL (ref 0.00–0.07)
Basophils Absolute: 0.1 K/uL (ref 0.0–0.1)
Basophils Relative: 1 %
Eosinophils Absolute: 0.1 K/uL (ref 0.0–0.5)
Eosinophils Relative: 2 %
HCT: 38.5 % — ABNORMAL LOW (ref 39.0–52.0)
Hemoglobin: 12 g/dL — ABNORMAL LOW (ref 13.0–17.0)
Immature Granulocytes: 0 %
Lymphocytes Relative: 14 %
Lymphs Abs: 1.2 K/uL (ref 0.7–4.0)
MCH: 26.5 pg (ref 26.0–34.0)
MCHC: 31.2 g/dL (ref 30.0–36.0)
MCV: 85 fL (ref 80.0–100.0)
Monocytes Absolute: 0.5 K/uL (ref 0.1–1.0)
Monocytes Relative: 6 %
Neutro Abs: 6.5 K/uL (ref 1.7–7.7)
Neutrophils Relative %: 77 %
Platelet Count: 250 K/uL (ref 150–400)
RBC: 4.53 MIL/uL (ref 4.22–5.81)
RDW: 14.4 % (ref 11.5–15.5)
WBC Count: 8.4 K/uL (ref 4.0–10.5)
nRBC: 0 % (ref 0.0–0.2)

## 2023-12-06 LAB — CMP (CANCER CENTER ONLY)
ALT: 10 U/L (ref 0–44)
AST: 14 U/L — ABNORMAL LOW (ref 15–41)
Albumin: 4.3 g/dL (ref 3.5–5.0)
Alkaline Phosphatase: 63 U/L (ref 38–126)
Anion gap: 8 (ref 5–15)
BUN: 21 mg/dL (ref 8–23)
CO2: 24 mmol/L (ref 22–32)
Calcium: 9.5 mg/dL (ref 8.9–10.3)
Chloride: 107 mmol/L (ref 98–111)
Creatinine: 1.62 mg/dL — ABNORMAL HIGH (ref 0.61–1.24)
GFR, Estimated: 43 mL/min — ABNORMAL LOW (ref >60)
Glucose, Bld: 184 mg/dL — ABNORMAL HIGH (ref 70–99)
Potassium: 4.1 mmol/L (ref 3.5–5.1)
Sodium: 139 mmol/L (ref 135–145)
Total Bilirubin: 0.6 mg/dL (ref 0.0–1.2)
Total Protein: 7.3 g/dL (ref 6.5–8.1)

## 2023-12-06 LAB — LACTATE DEHYDROGENASE: LDH: 126 U/L (ref 105–235)

## 2023-12-06 NOTE — Progress Notes (Signed)
 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 male who has been referred to us  by Dr Luke for evaluation and management of primary testicular large B-cell lymphoma   Patient has a history of hypertension, borderline diabetes, previous nonischemic cardiomyopathy but with a recent MI,, CHF who presented with a left testicular swelling end of sept/ early oct 2021.  He notes that he was seen by Urology - Dr Lonni Pang and had an ultrasound--possible infection was suspected and he received 2 rounds of abx without significant improvement.  He notes he received 2 subsequent follow-up ultrasounds in December in January and finally had left transinguinal orchiectomy on 03/11/2020. He notes he had persistent testicular swelling post operatively which required drainage.  He also notes having issues with urinary retention and had a urinary catheter for 2 weeks, abx+   Pathology showed primary testicular large B-cell lymphoma.  No other additional imaging studies for staging have been done at this time.   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   Patient was admitted to the hospital on 03/21/2020 with acute onset dyspnea epigastric pain and cough with pink frothy sputum.  He was noted to have hypoxia with oxygen saturation of 82% on arrival.  He was noted to have non-ST elevation MI on 2/26.  CTA chest ruled out PE.  His symptoms from fluid with aggressive diuresis.  He was noted to have severe triple-vessel disease on cath with an ejection fraction of 25 to 35% with no significant valvular lesions.  He subsequently  had CORONARY ARTERY BYPASS GRAFTING (CABG), ON PUMP, TIMES FOUR, USING BILATERAL INTERNAL MAMMARY ARTERIES AND LEFT RADIAL ARTERY .. On March 25, 2020.   Patient notes he is gradually recovering from his cardiac surgery notes being fatigued having gone through multiple urologic issues, skin surgery for squamous of carcinoma of the nose, acute MI CABG x4.   Patient notes no fevers no chills no night sweats. He notes he might have lost some weight but difficult to quantify in the setting of fluid overload and diuresis. Notes he is starting to eat a little better. Minimal left scrotal swelling. No other acute new focal symptoms.  INTERVAL HISTORY Roy [name] is a 78 y.o. male here for continued evaluation and management of his primary testicular large B-cell lymphoma. He is accompanied by his wife today and is ambulating with a cane today.  Patient was last seen by me on 08/16/2023 and complained of continued leg weakness, noting a fall on 08/05/2023 while getting out of a pick-up truck, some pain occasionally on the right side of his upper nose, short-term memory issues, urinary frequency issues at night.  He was most recently seen by Buckley Hussar, MD on 10/17/2023 and noted some fatigue, short term memory, occasional episodes of loss of function in legs with subsequent impaired balance and a fall.  Today, he is doing well though notes that he has a non-productive cough which he has been prescribed a Zpak for. This has been ongoing for a couple of months. Denies fever/chills, SOB, headaches.  Does endorse a small change in vision of his right eye that he noticed during  Summer - has recently had an eye exam with new glasses. He will be returning to his eye doctors for this. Denies ocular pain.   He notes that he has started to experience pain on his nose where he previous had SCC removed. He will be returning to Dermatology to discuss this.   He reports some persisting weakness in his legs. He  uses a cane, walker, and wheelchair to ambulate. Notes he did almost fall recently; endorses some staggering while using cane.  He has started walking some more per advice.   His wife notes that his short term memory is worsening, but does not have any issues with long-term memory.  Says that his GP suggested that he can be cleared for driving, so they plan on making an appointment with Dr. Vazlow. He does drive short distances from home, has not done any long-distance driving.  He states that he is eating well and his weight is stable, noting that he's been weighed at different weights during recent visits - at-home scale 178.  Denies SOB, chest pain, abdominal pain, change in bowel habits, testicular pain/swelling. Endorses constipation, managed with Senna.   MEDICAL HISTORY:  Past Medical History:  Diagnosis Date   Allergic rhinitis    Arthritis    wrists   Cardiomyopathy, nonischemic [facility])    followed by cardiology--- dr maranda---  2016 ef 45-50% ,  2016 nuclear ef 37%,  2017 per echo ef 50-55%   CHF (congestive heart failure), NYHA class III (HCC) 03/21/2020   Coronary artery disease    GERD (gastroesophageal reflux disease)    Hiatal hernia    History of kidney stones    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;   01/ 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   History of syncope (03-06-2020 pt stated has not had sycopal episode in few years, stated it seems to happen in extreme hot conditions)   cardiologist--- dr lois maranda--- dx recurrent syncope;  nuclear study 11-03-2014 intermediate risk w/ no ischemia, apical hypokinesis, nuclear ef 37%;  event monitor-- 12-21-2015 SB/ ST  no pauses/ arrythmia's;  echo 08-03-2015 ef 50-55%   Hyperlipidemia    Hypertension    followed by pcp   Hypovitaminosis D    Mass of left testicle    Nocturia    Plantar fasciitis    Presence of surgical incision    01/ 2022  moh's w/ skin graft of nose, per pt still  healing and wear bandage daily   Type 2 diabetes mellitus (HCC)    pt is adament that he is not and have been told he is a diabetic but a borderline;  followed by pcp, in pcp note states DM2 and takes 2 meds daily   Wears glasses    Wears hearing aid in both ears    Weight loss 11/16/2020    SURGICAL HISTORY: Past Surgical History:  Procedure Laterality Date   COLONOSCOPY  last one 01-30-2017   CORONARY ARTERY BYPASS GRAFT N/A 03/25/2020   Procedure: CORONARY ARTERY BYPASS GRAFTING (CABG), ON PUMP, TIMES FOUR, USING BILATERAL INTERNAL MAMMARY ARTERIES AND LEFT RADIAL ARTERY;  Surgeon: German Bartlett PEDLAR, MD;  Location: [facility] OR;  Service: Open Heart Surgery;  Laterality: N/A;   ICD IMPLANT N/A 08/23/2021   Procedure: ICD IMPLANT;  Surgeon: Waddell Danelle ORN, MD;  Location: [facility] INVASIVE CV LAB;  Service: Cardiovascular;  Laterality: N/A;   IR IMAGING GUIDED PORT INSERTION  07/06/2020   LOW ANTERIOR RESECTION RECTUM W/ COLOPROCTOSTOMY  05/2003   MOHS SURGERY  01/2020   nose w/ graft   ORCHIECTOMY Left 03/11/2020   Procedure: CASSAUNDRA FONTANA;  Surgeon: Devere Lonni Righter, MD;  Location: [facility];  Service: Urology;  Laterality: Left;  ONLY NEEDS 60 MIN   PORTA CATH REMOVAL N/A 08/23/2021   Procedure: PORTA CATH REMOVAL;  Surgeon: Waddell Danelle ORN, MD;  Location: [facility] INVASIVE CV LAB;  Service: Cardiovascular;  Laterality: N/A;   RADIAL ARTERY HARVEST Left 03/25/2020   Procedure: LEFT RADIAL ARTERY HARVEST;  Surgeon: German Bartlett PEDLAR, MD;  Location: [facility] OR;  Service: Open Heart Surgery;  Laterality: Left;   RIGHT/LEFT HEART CATH AND CORONARY ANGIOGRAPHY N/A 03/23/2020   Procedure: RIGHT/LEFT HEART CATH AND CORONARY ANGIOGRAPHY;  Surgeon: Dann Candyce RAMAN, MD;  Location: [facility] INVASIVE CV LAB;  Service: Cardiovascular;  Laterality: N/A;   SHOULDER SURGERY Right 1992; 07/ 2021   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 Left 12/24/2008   left ear and nose    TEE WITHOUT  CARDIOVERSION N/A 03/25/2020   Procedure: TRANSESOPHAGEAL ECHOCARDIOGRAM (TEE);  Surgeon: German Bartlett PEDLAR, MD;  Location: [facility] OR;  Service: Open Heart Surgery;  Laterality: N/A;   TONSILLECTOMY AND ADENOIDECTOMY  child   UPPER GASTROINTESTINAL ENDOSCOPY  last one 06-06-2017    SOCIAL HISTORY: Social History   Socioeconomic History   Marital status: Married    Spouse name: Not on file   Number of children: Not on file   Years of education: Not on file   Highest education level: Not on file  Occupational History   Not on file  Tobacco Use   Smoking status: Former    Types: Pipe    Quit date: 11/29/1976    Years since quitting: 47.0   Smokeless tobacco: Never  Vaping Use   Vaping status: Never Used  Substance and Sexual Activity   Alcohol use: Yes    Alcohol/week: 0.0 standard drinks of alcohol    Comment: Occasional drink    Drug use: Never   Sexual activity: Not on file  Other Topics Concern   Not on file  Social History Narrative   Not on file   Social Drivers of Health   Financial Resource Strain: Not on file  Food Insecurity: No Food Insecurity (12/06/2022)   Hunger Vital Sign    Worried About Running Out of Food in the Last Year: Never true    Ran Out of Food in the Last Year: Never true  Transportation Needs: No Transportation Needs (12/06/2022)   PRAPARE - Administrator, Civil Service (Medical): No    Lack of Transportation (Non-Medical): No  Physical Activity: Not on file  Stress: Not on file  Social Connections: Not on file  Intimate Partner Violence: Not At Risk (12/06/2022)   Humiliation, Afraid, Rape, and Kick questionnaire    Fear of Current or Ex-Partner: No    Emotionally Abused: No    Physically Abused: No    Sexually Abused: No    FAMILY HISTORY: Family History  Problem Relation Age of Onset   Heart attack Mother    Stroke Father    Colon cancer Neg Hx    Esophageal cancer Neg Hx    Pancreatic cancer Neg Hx    Rectal cancer  Neg Hx    Stomach cancer Neg Hx    Colon polyps Neg Hx     ALLERGIES:  has no known allergies.  MEDICATIONS:  Current Outpatient Medications  Medication Sig Dispense Refill   aspirin  EC 81 MG tablet Take 81 mg by mouth daily. Swallow whole.     B-Complex TABS Take 1 tablet by mouth daily.     Cholecalciferol  (VITAMIN D ) 50 MCG (2000 UT) CAPS Take 2,000 Units by mouth daily. Monday-Friday     empagliflozin  (JARDIANCE ) 10 MG TABS tablet Take 1 tablet (10 mg total) by mouth daily before breakfast. 90 tablet 0   feeding supplement (ENSURE ENLIVE / ENSURE PLUS) LIQD Take 237 mLs by mouth 2 (two) times daily between meals. (Patient taking differently: Take 237 mLs by mouth 2 (two) times daily between meals. Prn if not eating well) 237 mL 12   glucosamine-chondroitin 500-400 MG tablet Take 1 tablet by mouth See admin instructions. 5 times weekly     metoprolol  succinate (TOPROL -XL) 50 MG 24 hr tablet Take 1 tablet (50 mg total) by mouth daily. Take with or immediately following a meal. 30 tablet 2   Multiple Vitamin (MULTIVITAMIN WITH MINERALS) TABS tablet Take 1 tablet by mouth See admin instructions. 5 times weekly     omeprazole  (PRILOSEC) 20 MG capsule Take 20 mg by mouth daily.     spironolactone  (ALDACTONE ) 25 MG tablet TAKE 1/2 TABLET (12.5 MG TOTAL) BY MOUTH DAILY. 30 tablet 2   No current facility-administered medications for this visit.    REVIEW OF SYSTEMS:    10 Point review of Systems was done is negative except as noted above.   PHYSICAL EXAMINATION: ECOG PERFORMANCE STATUS: 2 - Symptomatic, <50% confined to bed  .BP (!) 116/96   Pulse 98   Temp (!) 97.5 F (36.4 C)   Resp 18   Wt 173 lb 4.8 oz (78.6 kg)   SpO2 99%   BMI 23.83 kg/m   GENERAL:alert, in no acute distress and comfortable SKIN: no acute rashes, no significant lesions EYES: conjunctiva are pink and non-injected, sclera anicteric OROPHARYNX: MMM, no exudates, no oropharyngeal erythema or ulceration NECK:  supple, no JVD LYMPH:  no palpable lymphadenopathy in the cervical, axillary or inguinal regions LUNGS: clear to auscultation b/l with normal respiratory effort HEART: regular rate & rhythm ABDOMEN:  normoactive bowel sounds , non tender, not distended. Extremity: no pedal edema PSYCH: alert & oriented x 3 with fluent speech NEURO: no focal motor/sensory deficits   LABORATORY DATA:  I have reviewed the data as listed      Latest Ref Rng & Units 12/06/2023   10:15 AM 08/16/2023    2:08 PM 06/02/2023   10:33 AM  CBC EXTENDED  WBC 4.0 - 10.5 K/uL 8.4  8.6  7.4   RBC 4.22 - 5.81 MIL/uL 4.53  4.34  4.18   Hemoglobin 13.0 - 17.0 g/dL 87.9  88.3  88.8   HCT 39.0 - 52.0 % 38.5  36.9  34.7   Platelets 150 - 400 K/uL 250  264  246   NEUT# 1.7 - 7.7 K/uL 6.5  6.7  5.8   Lymph# 0.7 - 4.0 K/uL 1.2  1.0  0.9        Latest Ref Rng & Units 08/16/2023    2:08 PM 06/02/2023   10:33 AM 03/17/2023    2:31 PM  CMP  Glucose 70 - 99 mg/dL 865  813  872   BUN 8 - 23 mg/dL 27  25  19    Creatinine 0.61 - 1.24 mg/dL 8.24  8.24  8.50   Sodium 135 - 145 mmol/L 143  140  139   Potassium 3.5 - 5.1 mmol/L 4.1  4.0  4.2   Chloride 98 - 111 mmol/L 111  110  107   CO2 22 - 32 mmol/L 24  24  25    Calcium  8.9 - 10.3 mg/dL 9.6  9.4  9.8   Total Protein 6.5 - 8.1 g/dL 7.1  6.8  6.6   Total Bilirubin 0.0 - 1.2 mg/dL 0.6  0.6  0.6   Alkaline Phos 38 - 126 U/L 71  59  61   AST 15 - 41 U/L 12  11  13    ALT 0 - 44 U/L 9  8  8     . Lab Results  Component Value Date   LDH 129 08/16/2023   SURGICAL PATHOLOGY   THIS IS AN ADDENDUM REPORT   CASE: [id number]  PATIENT: Amelio Hickson  Surgical Pathology Report  Addendum    Reason for Addendum #1:  Molecular Genetic Test Results, FISH   Clinical History: Left testicular mass (crm)      FINAL MICROSCOPIC DIAGNOSIS:   A. TESTICLE, LEFT, ORCHIECTOMY:  -  Diffuse aggressive large B-cell lymphoma  -  See comment    COMMENT:   Sections of testicle show  architectural effacement by sheets of large  lymphoid cells with vesicular nuclei and pale cytoplasm.  There is  admixed apoptotic debris and increased mitotic activity.  By  immunohistochemistry, the large lymphoid cells are positive for CD20,  CD5 (dim), BCL6 (dim), MUM1, and BCL2.  They are negative for CD10, CD30  (<1%), cyclin D1, and TdT.  The Ki67 proliferation index is up to  approximately 70%.  CD3 highlights small T-cells in the background. EBV  is negative by in situ hybridization.   Together, the findings support the diagnosis of a diffuse aggressive  large B-cell lymphoma. The differential diagnosis includes a diffuse  large B-cell lymphoma, NOS, with activated B-cell subtype by the [facility]  algorithm and high-grade B-cell lymphoma with MYC and BCL2 or BCL6  rearrangements.  FISH for BCL2, BCL6, and MYC rearrangements will be  performed. Correlation with clinical and radiographic findings is  recommended for consideration of a primary testicular lymphoma.   Result reported to L. Gibson on 03/16/20 at 1720 by S. O'Neill.    ADDENDUM:   FISH RESULTS:   Results: NORMAL   Interpretation:   BCL6 rearrangement:      Not Detected  MYC rearrangement:  Not Detected  MYC amplification:  Not Detected  BCL6 rearrangement:      Not Detected   Please note this testing was performed and interpreted by an outside  facility ([facility]).  This addendum is only being added to provide a  summary of the results for report completeness.  Please see electronic  medical record for a copy of the full report.   RADIOGRAPHIC STUDIES: I have personally reviewed the radiological images as listed and agreed with the findings in the report. CUP PACEART REMOTE DEVICE CHECK Result Date: 11/30/2023 ICD: Scheduled remote reviewed. Normal device function.  Presenting rhythm: AS/VS 11 NSVT, V>A, (10/01) NSVT episdoe greater than 20 beats, V-rates average 200 bpm, sent to triage Next remote  transmission per protocol. ML, CVRS    ASSESSMENT & PLAN:   78 year old wonderful gentleman with history of hypertension, borderline diabetes, dyslipidemia, GERD with recent STEMI on 03/21/2020 and status post four-vessel CABG on 03/25/2020.  1) Left-sided primary testicular large B-cell lymphoma.- currently in remission S/p ipsilateral orchiectomy S/p 6 cycles of R-CEOP  and 3 dose of IT MTX (for CNS prophylaxis) and R Tto contralateral testis. BCL-2, BCL 6, c-Myc negative.  2) history of acute myocardial infarction status post CABG x4 (06/26/2020) 3) nonischemic and ischemic cardiomyopathy ejection fraction 25 to 30% on last echo. 4) hypertension 5) diabetes type 2-patient claims this has been borderline 6) dyslipidemia 7) GERD 8) squamous cell carcinoma of the nose status post Mohs surgery in January 2022   PLAN: - Discussed lab results on 12/06/2023 in detail with patient: CBC showed WBC of 8.4K, Hemoglobin of 12.0 increased from 11.6, and PLTs of 250K. CMP and LDH pending.  - Suggested using a rolling walker considering occasional imbalances.  - Reviewed 10/03/2023 Brain MRI:  1. Similar appearance of treated lesion in the anterior right frontal lobe. No nodular or mass-like enhancement. 2. No new intracranial lesion or acute findings.  No changes. - Schedule an appointment with Dr. Vazlow to discuss driving clearance.  - No enlarged lymph nodes during physical exam  - No reported symptoms suggestive of lymphoma recurrence at this time  FOLLOW-UP: RTC with Dr Onesimo with labs in 4 months  The total time spent in the appointment was 25 minutes* .  All of the patient's questions were answered with apparent satisfaction. The patient knows to call the clinic with any problems, questions or concerns.   Emaline Onesimo MD MS AAHIVMS [facility] Hematology/Oncology Physician [facility]  .*Total Encounter Time as defined by the Centers for Medicare and Medicaid Services  includes, in addition to the face-to-face time of a patient visit (documented in the note above) non-face-to-face time: obtaining and reviewing outside history, ordering and reviewing medications, tests or procedures, care coordination (communications with other health care professionals or caregivers) and documentation in the medical record.    I,  Damien Lagle,acting as a scribe for Emaline Onesimo, MD.,have documented all relevant documentation on the behalf of Emaline Onesimo, MD,as directed by  Emaline Onesimo, MD while in the presence of Emaline Onesimo, MD.  I have reviewed the above documentation for accuracy and completeness, and I agree with the above. Emaline Candida Onesimo MD.

## 2023-12-12 ENCOUNTER — Encounter: Payer: Self-pay | Admitting: Hematology

## 2023-12-14 DIAGNOSIS — M25562 Pain in left knee: Secondary | ICD-10-CM | POA: Diagnosis not present

## 2023-12-14 DIAGNOSIS — S83412A Sprain of medial collateral ligament of left knee, initial encounter: Secondary | ICD-10-CM | POA: Diagnosis not present

## 2023-12-19 DIAGNOSIS — D485 Neoplasm of uncertain behavior of skin: Secondary | ICD-10-CM | POA: Diagnosis not present

## 2023-12-19 DIAGNOSIS — Z85828 Personal history of other malignant neoplasm of skin: Secondary | ICD-10-CM | POA: Diagnosis not present

## 2023-12-19 DIAGNOSIS — L821 Other seborrheic keratosis: Secondary | ICD-10-CM | POA: Diagnosis not present

## 2023-12-19 DIAGNOSIS — C44321 Squamous cell carcinoma of skin of nose: Secondary | ICD-10-CM | POA: Diagnosis not present

## 2023-12-19 DIAGNOSIS — L814 Other melanin hyperpigmentation: Secondary | ICD-10-CM | POA: Diagnosis not present

## 2023-12-19 DIAGNOSIS — L570 Actinic keratosis: Secondary | ICD-10-CM | POA: Diagnosis not present

## 2023-12-19 DIAGNOSIS — C44329 Squamous cell carcinoma of skin of other parts of face: Secondary | ICD-10-CM | POA: Diagnosis not present

## 2024-01-03 ENCOUNTER — Telehealth: Payer: Self-pay

## 2024-01-03 NOTE — Telephone Encounter (Signed)
 Discussed with Dr. Waddell.  Have Pt follow up with Dr. Almetta in January 2026.  Encourage Pt to not miss any doses of his Toprol .  If further VT need to consider uptitrating Toprol .

## 2024-01-03 NOTE — Telephone Encounter (Signed)
 Outreach made to Pt's wife.  Advised of Dr. Adrian recommendations.  Advised of upcoming appointment.  No questions.  Will continue to monitor.

## 2024-01-03 NOTE — Telephone Encounter (Signed)
 Alert received for VF detected:  Event 01/03/2024 at 4:00 am per Pt.  (Timestamp has 6:53 am). Per Pt he was sleeping when his heart started racing which woke him up.  He states the episode lasted about 15 seconds and he received a shock.  He states this is the first shock he has ever received.  After the shock he states he felt fine so he went back to bed.  Currently Pt feels fine.  Denies any symptoms.  Pt states compliance with Toprol  as ordered.  States he will take it first thing this morning.  Advised Pt no driving x 6 months.  Per Pt he has not driven anywhere in 3 years.  Advised would review with Dr. Waddell and call Pt back with recommendations.  Pt is due for his yearly follow up.  Recent lab work on 12/06/2023 WNL.

## 2024-01-04 NOTE — CV Procedure (Signed)
°  Device system confirmed to be MRI conditional, with implant date > 6 weeks ago, and no evidence of abandoned or epicardial leads in review of most recent CXR  Device last cleared by EP Provider: Daphne Barrack 01/04/24  Clearance is good through for 1 year as long as parameters remain stable at time of check. If pt undergoes a cardiac device procedure during that time, they should be re-cleared.   Tachy-therapies to be programmed off if applicable with device back to pre-MRI settings after completion of exam.  [employer] was available remotely to assist in programming recommendations.   Rocky Catalan, RT  01/04/2024 3:41 PM

## 2024-01-08 ENCOUNTER — Other Ambulatory Visit (HOSPITAL_COMMUNITY)

## 2024-01-09 ENCOUNTER — Ambulatory Visit (HOSPITAL_COMMUNITY)
Admission: RE | Admit: 2024-01-09 | Discharge: 2024-01-09 | Disposition: A | Source: Ambulatory Visit | Attending: Internal Medicine

## 2024-01-09 DIAGNOSIS — C8339 Primary central nervous system lymphoma: Secondary | ICD-10-CM

## 2024-01-09 MED ORDER — GADOBUTROL 1 MMOL/ML IV SOLN
7.5000 mL | Freq: Once | INTRAVENOUS | Status: AC | PRN
  Administered 2024-01-09: 17:00:00 7.5 mL via INTRAVENOUS

## 2024-01-15 ENCOUNTER — Ambulatory Visit: Admitting: Internal Medicine

## 2024-01-23 ENCOUNTER — Inpatient Hospital Stay: Attending: Hematology | Admitting: Internal Medicine

## 2024-01-23 VITALS — BP 104/72 | HR 94 | Temp 97.8°F | Resp 17 | Wt 162.9 lb

## 2024-01-23 DIAGNOSIS — C8339 Primary central nervous system lymphoma: Secondary | ICD-10-CM

## 2024-01-23 DIAGNOSIS — C83398 Diffuse large b-cell lymphoma of other extranodal and solid organ sites: Secondary | ICD-10-CM | POA: Insufficient documentation

## 2024-01-23 DIAGNOSIS — Z87891 Personal history of nicotine dependence: Secondary | ICD-10-CM | POA: Diagnosis not present

## 2024-01-23 NOTE — Progress Notes (Signed)
 "  [facility] [address]  [place], [place] [address] [phone]   Interval Evaluation  Date of Service: 01/23/2024 Patient Name: Roy [name] Patient MRN: [id number] Patient DOB: 1945-12-27 Provider: Arthea MARLA Manns, MD  Identifying Statement:  Roy [name] is a 78 y.o. 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   07/13/2020 - 11/25/2020 Chemotherapy   Patient is on Treatment Plan : NON-HODGKIN'S LYMPHOMA R-CEOP Q21D     02/09/2021 Cancer Staging   Staging form: Hodgkin and Non-Hodgkin Lymphoma, AJCC 8th Edition - Clinical: Stage IE (Diffuse large B-cell lymphoma) - Signed by Onesimo Emaline Brink, MD on 02/09/2021   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 Patrcia, 39.1Gy in 17 fractions  Interval History: Roy [name] presents after recent MRI brain study.  Doesn't report any new or progressive changes today.  Short term memory impairment and fatigue are persistent.  Remains active physically otherwise, though appetite is still poor.  Denies headaches, seizures.  H+P (06/13/23)  Patient presents today for evaluation following recent MRI brain.  He describes some issues with fatigue and short term memory loss, more prominent than prior to radiation.  He also has been experiencing occasional episodes of loss of function with both legs, leading to impaired balance and one notable fall.  No frank seizures or headaches.  He continues to otherwise follow with Dr. Onesimo, remains on observation and in remission for systemic B-cell lymphoma.  Medications: Current Outpatient Medications on File Prior to Visit  Medication Sig Dispense Refill   aspirin  EC 81 MG tablet Take 81 mg by mouth daily. Swallow whole.     B-Complex TABS Take 1 tablet by mouth daily.     Cholecalciferol  (VITAMIN D ) 50 MCG (2000 UT) CAPS Take 2,000 Units by mouth daily. Monday-Friday     empagliflozin  (JARDIANCE ) 10 MG TABS tablet Take 1 tablet (10 mg total) by mouth daily before breakfast. 90 tablet 0   feeding supplement (ENSURE ENLIVE / ENSURE PLUS) LIQD Take 237 mLs by mouth 2 (two) times daily between meals. (Patient taking differently: Take 237 mLs by mouth 2 (two) times daily between meals. Prn if not eating well) 237 mL 12   glucosamine-chondroitin 500-400 MG tablet Take 1 tablet by mouth See admin instructions. 5 times weekly     metoprolol  succinate (TOPROL -XL) 50 MG 24 hr tablet Take 1 tablet (50 mg total) by mouth daily. Take with or immediately following a meal. 30 tablet 2   Multiple Vitamin (MULTIVITAMIN WITH MINERALS) TABS tablet Take 1 tablet by mouth See admin instructions. 5 times weekly     omeprazole (PRILOSEC) 20 MG capsule Take 20 mg by mouth daily.  spironolactone  (ALDACTONE ) 25 MG tablet TAKE 1/2 TABLET (12.5 MG TOTAL) BY MOUTH DAILY. 30 tablet 2   No current facility-administered medications on file prior to visit.    Allergies: No Known Allergies Past Medical History:  Past Medical History:  Diagnosis Date   Allergic rhinitis    Arthritis    wrists   Cardiomyopathy,  nonischemic (HCC)    followed by cardiology--- dr maranda---  2016 ef 45-50% ,  2016 nuclear ef 37%,  2017 per echo ef 50-55%   CHF (congestive heart failure), NYHA class III (HCC) 03/21/2020   Coronary artery disease    GERD (gastroesophageal reflux disease)    Hiatal hernia    History of kidney stones    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;   01/ 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   History of syncope (03-06-2020 pt stated has not had sycopal episode in few years, stated it seems to happen in extreme hot conditions)   cardiologist--- dr lois maranda--- dx recurrent syncope;  nuclear study 11-03-2014 intermediate risk w/ no ischemia, apical hypokinesis, nuclear ef 37%;  event monitor-- 12-21-2015 SB/ ST  no pauses/ arrythmia's;  echo 08-03-2015 ef 50-55%   Hyperlipidemia    Hypertension    followed by pcp   Hypovitaminosis D    Mass of left testicle    Nocturia    Plantar fasciitis    Presence of surgical incision    01/ 2022  moh's w/ skin graft of nose, per pt still healing and wear bandage daily   Type 2 diabetes mellitus (HCC)    pt is adament that he is not and have been told he is a diabetic but a borderline;  followed by pcp, in pcp note states DM2 and takes 2 meds daily   Wears glasses    Wears hearing aid in both ears    Weight loss 11/16/2020   Past Surgical History:  Past Surgical History:  Procedure Laterality Date   COLONOSCOPY  last one 01-30-2017   CORONARY ARTERY BYPASS GRAFT N/A 03/25/2020   Procedure: CORONARY ARTERY BYPASS GRAFTING (CABG), ON PUMP, TIMES FOUR, USING BILATERAL INTERNAL MAMMARY ARTERIES AND LEFT RADIAL ARTERY;  Surgeon: German Bartlett PEDLAR, MD;  Location: [facility] OR;  Service: Open Heart Surgery;  Laterality: N/A;   ICD IMPLANT N/A 08/23/2021   Procedure: ICD IMPLANT;  Surgeon: Waddell Danelle ORN, MD;  Location: [facility] INVASIVE CV LAB;  Service: Cardiovascular;  Laterality: N/A;   IR IMAGING GUIDED PORT INSERTION  07/06/2020    LOW ANTERIOR RESECTION RECTUM W/ COLOPROCTOSTOMY  05/2003   MOHS SURGERY  01/2020   nose w/ graft   ORCHIECTOMY Left 03/11/2020   Procedure: CASSAUNDRA FONTANA;  Surgeon: Devere Lonni Righter, MD;  Location: [facility];  Service: Urology;  Laterality: Left;  ONLY NEEDS 60 MIN   PORTA CATH REMOVAL N/A 08/23/2021   Procedure: PORTA CATH REMOVAL;  Surgeon: Waddell Danelle ORN, MD;  Location: [facility] INVASIVE CV LAB;  Service: Cardiovascular;  Laterality: N/A;   RADIAL ARTERY HARVEST Left 03/25/2020   Procedure: LEFT RADIAL ARTERY HARVEST;  Surgeon: German Bartlett PEDLAR, MD;  Location: [facility] OR;  Service: Open Heart Surgery;  Laterality: Left;   RIGHT/LEFT HEART CATH AND CORONARY ANGIOGRAPHY N/A 03/23/2020   Procedure: RIGHT/LEFT HEART CATH AND CORONARY ANGIOGRAPHY;  Surgeon: Dann Candyce RAMAN, MD;  Location: [facility] INVASIVE CV LAB;  Service: Cardiovascular;  Laterality: N/A;   SHOULDER SURGERY Right 1992;  07/ 2021   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 Left 12/24/2008   left ear and nose    TEE WITHOUT CARDIOVERSION N/A 03/25/2020   Procedure: TRANSESOPHAGEAL ECHOCARDIOGRAM (TEE);  Surgeon: German Bartlett PEDLAR, MD;  Location: [facility] OR;  Service: Open Heart Surgery;  Laterality: N/A;   TONSILLECTOMY AND ADENOIDECTOMY  child   UPPER GASTROINTESTINAL ENDOSCOPY  last one 06-06-2017   Social History:  Social History   Socioeconomic History   Marital status: Married    Spouse name: Not on file   Number of children: Not on file   Years of education: Not on file   Highest education level: Not on file  Occupational History   Not on file  Tobacco Use   Smoking status: Former    Types: Pipe    Quit date: 11/29/1976    Years since quitting: 47.1   Smokeless tobacco: Never  Vaping Use   Vaping status: Never Used  Substance and Sexual Activity   Alcohol use: Yes    Alcohol/week: 0.0 standard drinks of alcohol    Comment: Occasional drink    Drug use: Never   Sexual activity: Not on  file  Other Topics Concern   Not on file  Social History Narrative   Not on file   Social Drivers of Health   Tobacco Use: Medium Risk (01/10/2023)   Patient History    Smoking Tobacco Use: Former    Smokeless Tobacco Use: Never    Passive Exposure: Not on Actuary Strain: Not on file  Food Insecurity: No Food Insecurity (12/06/2022)   Hunger Vital Sign    Worried About Running Out of Food in the Last Year: Never true    Ran Out of Food in the Last Year: Never true  Transportation Needs: No Transportation Needs (12/06/2022)   PRAPARE - Administrator, Civil Service (Medical): No    Lack of Transportation (Non-Medical): No  Physical Activity: Not on file  Stress: Not on file  Social Connections: Not on file  Intimate Partner Violence: Not At Risk (12/06/2022)   Humiliation, Afraid, Rape, and Kick questionnaire    Fear of Current or Ex-Partner: No    Emotionally Abused: No    Physically Abused: No    Sexually Abused: No  Depression (PHQ2-9): Low Risk (10/17/2023)   Depression (PHQ2-9)    PHQ-2 Score: 0  Alcohol Screen: Not on file  Housing: Low Risk (12/06/2022)   Housing    Last Housing Risk Score: 0  Utilities: Not At Risk (12/06/2022)   AHC Utilities    Threatened with loss of utilities: No  Health Literacy: Not on file   Family History:  Family History  Problem Relation Age of Onset   Heart attack Mother    Stroke Father    Colon cancer Neg Hx    Esophageal cancer Neg Hx    Pancreatic cancer Neg Hx    Rectal cancer Neg Hx    Stomach cancer Neg Hx    Colon polyps Neg Hx     Review of Systems: Constitutional: Doesn't report fevers, chills or abnormal weight loss Eyes: Doesn't report blurriness of vision Ears, nose, mouth, throat, and face: Doesn't report sore throat Respiratory: Doesn't report cough, dyspnea or wheezes Cardiovascular: Doesn't report palpitation, chest discomfort  Gastrointestinal:  Doesn't report nausea,  constipation, diarrhea GU: Doesn't report incontinence Skin: Doesn't report skin rashes Neurological: Per HPI Musculoskeletal: Doesn't report joint pain Behavioral/Psych: Doesn't report anxiety  Physical  Exam: Vitals:   01/23/24 1218  BP: 104/72  Pulse: 94  Resp: 17  Temp: 97.8 F (36.6 C)  SpO2: 99%    KPS: 70. General: Alert, cooperative, pleasant, in no acute distress Head: Normal EENT: No conjunctival injection or scleral icterus.  Lungs: Resp effort normal Cardiac: Regular rate Abdomen: Non-distended abdomen Skin: No rashes cyanosis or petechiae. Extremities: No clubbing or edema  Neurologic Exam: Mental Status: Awake, alert, attentive to examiner. Oriented to self and environment. Language is fluent with intact comprehension.  Cranial Nerves: Visual acuity is grossly normal. Visual fields are full. Extra-ocular movements intact. No ptosis. Face is symmetric Motor: Tone and bulk are normal. Power is full in both arms and legs. Reflexes are symmetric, no pathologic reflexes present.  Sensory: Intact to light touch Gait: Orthopedic dystaxia   Labs: I have reviewed the data as listed    Component Value Date/Time   NA 139 12/06/2023 1015   NA 139 09/02/2022 1000   K 4.1 12/06/2023 1015   CL 107 12/06/2023 1015   CO2 24 12/06/2023 1015   GLUCOSE 184 (H) 12/06/2023 1015   BUN 21 12/06/2023 1015   BUN 29 (H) 09/02/2022 1000   CREATININE 1.62 (H) 12/06/2023 1015   CREATININE 1.17 04/23/2020 0000   CALCIUM  9.5 12/06/2023 1015   PROT 7.3 12/06/2023 1015   ALBUMIN  4.3 12/06/2023 1015   AST 14 (L) 12/06/2023 1015   ALT 10 12/06/2023 1015   ALKPHOS 63 12/06/2023 1015   BILITOT 0.6 12/06/2023 1015   GFRNONAA 43 (L) 12/06/2023 1015   GFRAA >60 09/27/2015 1348   Lab Results  Component Value Date   WBC 8.4 12/06/2023   NEUTROABS 6.5 12/06/2023   HGB 12.0 (L) 12/06/2023   HCT 38.5 (L) 12/06/2023   MCV 85.0 12/06/2023   PLT 250 12/06/2023    Imaging:  MR  BRAIN W WO CONTRAST Result Date: 01/09/2024 EXAM: MRI BRAIN WITH AND WITHOUT CONTRAST 01/09/2024 04:46:53 PM TECHNIQUE: Multiplanar multisequence MRI of the head/brain was performed with and without the administration of intravenous contrast. COMPARISON: 10/03/2023 CLINICAL HISTORY: Brain/CNS neoplasm, assess treatment response. FINDINGS: BRAIN AND VENTRICLES: No acute infarct. No acute intracranial hemorrhage. No mass effect or midline shift. No hydrocephalus. The sella is unremarkable. Normal flow voids. Similar appearance of treated lesion in the anteromedial right frontal lobe involving the right superior frontal gyrus. Similar areas of surrounding T2 and FLAIR hyperintensity. Subtle curvilinear T1 hyperintensity along the margin is unchanged. There are no nodular or masslike foci of enhancement. FLAIR hyperintensity in the posterior limb of the right internal capsule at the site of additional treated lesion is unchanged. Similar areas of confluent T2 and FLAIR hyperintensity involving the periventricular and subcortical white matter likely reflecting combination of chronic microvascular ischemic changes and treatment related changes. Mild volume loss. ORBITS: No acute abnormality. SINUSES: Similar left greater than right mastoid effusions. BONES AND SOFT TISSUES: Normal bone marrow signal and enhancement. No acute soft tissue abnormality. IMPRESSION: 1. Stable appearance of treated lesions in the right frontal lobe and the posterior limb of the right internal capsule. 2. No new intracranial lesions. Electronically signed by: Donnice Mania MD 01/09/2024 08:48 PM EST RP Workstation: [id number]    [facility] Clinician Interpretation: I have personally reviewed the radiological images as listed.  My interpretation, in the context of the patient's clinical presentation, is stable disease   Assessment/Plan CNS lymphoma (HCC)  Roy [name] is clinically stable today from neurologic standpoint, with ongoing short  term  memory impairment and slowed processing speed from WBRT.  MRI brain demonstrates stable findings overall, now 12 months removed from radiation therapy.  Recommended continued MRI surveillance only.  They are agreeable with this plan.  Will con't to follow with Dr. Onesimo.  We appreciate the opportunity to participate in the care of Roy [name].   We ask that Roy [name] return to clinic in 4 months following next brain MRI, or sooner as needed.  All questions were answered. The patient knows to call the clinic with any problems, questions or concerns. No barriers to learning were detected.  The total time spent in the encounter was 30 minutes and more than 50% was on counseling and review of test results   Arthea MARLA Manns, MD Medical Director of Neuro-Oncology [facility] at [facility] 01/23/2024 12:23 PM "

## 2024-01-24 ENCOUNTER — Telehealth: Payer: Self-pay | Admitting: Internal Medicine

## 2024-01-24 NOTE — Telephone Encounter (Signed)
 Scheduled patient for next appointment. Called and spoke with the patients wife, she is aware.

## 2024-01-29 ENCOUNTER — Other Ambulatory Visit: Payer: Self-pay | Admitting: Cardiovascular Disease

## 2024-02-05 ENCOUNTER — Encounter: Payer: Self-pay | Admitting: Dermatology

## 2024-02-16 ENCOUNTER — Encounter: Payer: Self-pay | Admitting: *Deleted

## 2024-02-19 ENCOUNTER — Emergency Department
Admission: EM | Admit: 2024-02-19 | Discharge: 2024-02-19 | Disposition: A | Discharge status: Home / Self Care | Attending: Emergency Medicine | Admitting: Emergency Medicine

## 2024-02-19 ENCOUNTER — Emergency Department

## 2024-02-19 ENCOUNTER — Other Ambulatory Visit: Payer: Self-pay

## 2024-02-19 DIAGNOSIS — R404 Transient alteration of awareness: Secondary | ICD-10-CM | POA: Insufficient documentation

## 2024-02-19 DIAGNOSIS — I509 Heart failure, unspecified: Secondary | ICD-10-CM | POA: Insufficient documentation

## 2024-02-19 DIAGNOSIS — I110 Hypertensive heart disease with heart failure: Secondary | ICD-10-CM | POA: Insufficient documentation

## 2024-02-19 DIAGNOSIS — E860 Dehydration: Secondary | ICD-10-CM | POA: Insufficient documentation

## 2024-02-19 DIAGNOSIS — I2510 Atherosclerotic heart disease of native coronary artery without angina pectoris: Secondary | ICD-10-CM | POA: Insufficient documentation

## 2024-02-19 DIAGNOSIS — E119 Type 2 diabetes mellitus without complications: Secondary | ICD-10-CM | POA: Insufficient documentation

## 2024-02-19 DIAGNOSIS — Z8547 Personal history of malignant neoplasm of testis: Secondary | ICD-10-CM | POA: Insufficient documentation

## 2024-02-19 LAB — CBC WITH DIFFERENTIAL/PLATELET
Abs Immature Granulocytes: 0.03 10*3/uL (ref 0.00–0.07)
Basophils Absolute: 0 10*3/uL (ref 0.0–0.1)
Basophils Relative: 0 %
Eosinophils Absolute: 0.1 10*3/uL (ref 0.0–0.5)
Eosinophils Relative: 1 %
HCT: 35 % — ABNORMAL LOW (ref 39.0–52.0)
Hemoglobin: 10.8 g/dL — ABNORMAL LOW (ref 13.0–17.0)
Immature Granulocytes: 0 %
Lymphocytes Relative: 7 %
Lymphs Abs: 0.7 10*3/uL (ref 0.7–4.0)
MCH: 25.6 pg — ABNORMAL LOW (ref 26.0–34.0)
MCHC: 30.9 g/dL (ref 30.0–36.0)
MCV: 82.9 fL (ref 80.0–100.0)
Monocytes Absolute: 0.6 10*3/uL (ref 0.1–1.0)
Monocytes Relative: 6 %
Neutro Abs: 8 10*3/uL — ABNORMAL HIGH (ref 1.7–7.7)
Neutrophils Relative %: 86 %
Platelets: 249 10*3/uL (ref 150–400)
RBC: 4.22 MIL/uL (ref 4.22–5.81)
RDW: 14.7 % (ref 11.5–15.5)
WBC: 9.5 10*3/uL (ref 4.0–10.5)
nRBC: 0 % (ref 0.0–0.2)

## 2024-02-19 LAB — URINALYSIS, W/ REFLEX TO CULTURE (INFECTION SUSPECTED)
Bacteria, UA: NONE SEEN
Bilirubin Urine: NEGATIVE
Glucose, UA: >=500 mg/dL — AB
Hgb urine dipstick: NEGATIVE
Ketones, ur: NEGATIVE mg/dL
Leukocytes,Ua: NEGATIVE
Nitrite: NEGATIVE
Protein, ur: NEGATIVE mg/dL
Specific Gravity, Urine: 1.026 (ref 1.005–1.030)
Squamous Epithelial / HPF: 0 /HPF (ref 0–5)
pH: 5 (ref 5.0–8.0)

## 2024-02-19 LAB — LACTIC ACID, PLASMA
Lactic Acid, Venous: 2.5 mmol/L (ref 0.5–1.9)
Lactic Acid, Venous: 1.6 mmol/L (ref 0.5–1.9)

## 2024-02-19 LAB — COMPREHENSIVE METABOLIC PANEL WITH GFR
ALT: 27 U/L (ref 0–44)
AST: 25 U/L (ref 15–41)
Albumin: 3.6 g/dL (ref 3.5–5.0)
Alkaline Phosphatase: 79 U/L (ref 38–126)
Anion gap: 12 (ref 5–15)
BUN: 26 mg/dL — ABNORMAL HIGH (ref 8–23)
CO2: 21 mmol/L — ABNORMAL LOW (ref 22–32)
Calcium: 9.2 mg/dL (ref 8.9–10.3)
Chloride: 112 mmol/L — ABNORMAL HIGH (ref 98–111)
Creatinine, Ser: 1.73 mg/dL — ABNORMAL HIGH (ref 0.61–1.24)
GFR, Estimated: 39 mL/min — ABNORMAL LOW
Glucose, Bld: 80 mg/dL (ref 70–99)
Potassium: 4.2 mmol/L (ref 3.5–5.1)
Sodium: 145 mmol/L (ref 135–145)
Total Bilirubin: 1 mg/dL (ref 0.0–1.2)
Total Protein: 6.1 g/dL — ABNORMAL LOW (ref 6.5–8.1)

## 2024-02-19 LAB — PROTIME-INR
INR: 1.1 (ref 0.8–1.2)
Prothrombin Time: 15.2 s (ref 11.4–15.2)

## 2024-02-19 LAB — RESP PANEL BY RT-PCR (RSV, FLU A&B, COVID)  RVPGX2
Influenza A by PCR: NEGATIVE
Influenza B by PCR: NEGATIVE
Resp Syncytial Virus by PCR: NEGATIVE
SARS Coronavirus 2 by RT PCR: NEGATIVE

## 2024-02-19 MED ORDER — LACTATED RINGERS IV BOLUS: 1000.0000 mL | Freq: Once | INTRAVENOUS | Status: DC

## 2024-02-19 MED ORDER — SODIUM CHLORIDE 0.9 % IV SOLN
2.0000 g | Freq: Once | INTRAVENOUS | Status: AC
  Administered 2024-02-19: 2 g via INTRAVENOUS
  Filled 2024-02-19: qty 20

## 2024-02-19 MED ORDER — LACTATED RINGERS IV SOLN: INTRAVENOUS | Status: DC

## 2024-02-19 MED ORDER — LACTATED RINGERS IV BOLUS (SEPSIS)
1000.0000 mL | Freq: Once | INTRAVENOUS | Status: AC
  Administered 2024-02-19: 1000 mL via INTRAVENOUS

## 2024-02-19 MED ORDER — LACTATED RINGERS IV BOLUS (SEPSIS): 250.0000 mL | Freq: Once | INTRAVENOUS | Status: DC

## 2024-02-19 NOTE — ED Provider Notes (Signed)
 "  [facility] Provider Note   Event Date/Time   First MD Initiated Contact with Patient 02/19/24 1905     (approximate) History  Altered Mental Status  HPI Roy [name] is a 79 y.o. 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  Patient's caregiver states that he was slurring his speech for approximately an hour prior to her calling EMS.  She does however admit that patient's speech is back to baseline at this time.  Patient states that he does not feel well in my whole body. ROS: Patient currently denies any vision changes, tinnitus, difficulty speaking, facial droop, sore throat, chest pain, shortness of breath, abdominal pain, nausea/vomiting/diarrhea, dysuria, or weakness/numbness/paresthesias in any extremity   Physical Exam  Triage Vital Signs: ED Triage Vitals  Encounter Vitals Group     BP 02/19/24 1900 101/72     Girls Systolic BP Percentile --      Girls Diastolic BP Percentile --      Boys Systolic BP Percentile --      Boys Diastolic BP Percentile --      Pulse Rate 02/19/24 1900 92     Resp 02/19/24 1900 (!) 24     Temp 02/19/24 1900 97.8 F (36.6 C)     Temp Source 02/19/24 1900 Oral     SpO2 02/19/24 1900 99 %     Weight 02/19/24 1858 160 lb 15 oz (73 kg)     Height 02/19/24 1858 5' 11 (1.803 m)     Head Circumference --      Peak Flow --      Pain Score 02/19/24 1858 0     Pain Loc --      Pain Education --      Exclude from Growth Chart --    Most recent vital signs: Vitals:   02/19/24 2200 02/19/24 2230  BP: 92/75 105/70  Pulse: 80 (!) 101  Resp: (!) 25 19  Temp:    SpO2: 100% 100%   General: Awake, oriented x4. CV:  Good peripheral perfusion. Resp:  Normal effort. Abd:  No distention. Other:  Elderly  well-developed, well-nourished Caucasian male resting comfortably in no acute distress ED Results / Procedures / Treatments  Labs (all labs ordered are listed, but only abnormal results are displayed) Labs Reviewed  LACTIC ACID, PLASMA - Abnormal; Notable for the following components:      Result Value   Lactic Acid, Venous 2.5 (*)    All other components within normal limits  COMPREHENSIVE METABOLIC PANEL WITH GFR - Abnormal; Notable for the following components:   Chloride 112 (*)    CO2 21 (*)    BUN 26 (*)    Creatinine, Ser 1.73 (*)    Total Protein 6.1 (*)    GFR, Estimated 39 (*)    All other components within normal limits  CBC WITH DIFFERENTIAL/PLATELET - Abnormal; Notable for the following components:   Hemoglobin 10.8 (*)    HCT 35.0 (*)    MCH 25.6 (*)    Neutro Abs 8.0 (*)    All other components within normal limits  URINALYSIS, W/ REFLEX TO CULTURE (INFECTION SUSPECTED) - Abnormal; Notable for the following components:   Color, Urine YELLOW (*)    APPearance CLEAR (*)  Glucose, UA >=500 (*)    All other components within normal limits  RESP PANEL BY RT-PCR (RSV, FLU A&B, COVID)  [id number]  CULTURE, BLOOD (ROUTINE X 2)  CULTURE, BLOOD (ROUTINE X 2)  LACTIC ACID, PLASMA  PROTIME-INR   EKG ED ECG REPORT I, Artist MARLA Kerns, the attending physician, personally viewed and interpreted this ECG. Date: 02/19/2024 EKG Time: 1900 Rate: 92 Rhythm: normal sinus rhythm QRS Axis: normal Intervals: Left bundle branch block ST/T Wave abnormalities: normal Narrative Interpretation: Normal sinus rhythm with LBBB.  No evidence of acute ischemia RADIOLOGY ED MD interpretation: Single view portable chest x-ray shows vascular congestion with mild interstitial edema and mild right pleural effusion with mild cardiomegaly - All radiology independently interpreted and agree with radiology assessment Official radiology report(s): CT Head Wo Contrast Result Date: 02/19/2024 CLINICAL  DATA:  Mental status change EXAM: CT HEAD WITHOUT CONTRAST TECHNIQUE: Contiguous axial images were obtained from the base of the skull through the vertex without intravenous contrast. RADIATION DOSE REDUCTION: This exam was performed according to the departmental dose-optimization program which includes automated exposure control, adjustment of the mA and/or kV according to patient size and/or use of iterative reconstruction technique. COMPARISON:  MRI 01/09/2024, 10/03/2023, 12/08/2022, CT brain 12/12/2022 FINDINGS: Brain: Extensive white matter hypodensity as before. Cystic appearing area with adjacent encephalomalacia in the anterior right frontal lobe as seen on the prior MRI imaging. Small focus of linear hypodensity at the posterior limb of the right internal capsule at the site of prior lesion. No new mass, mass effect or midline shift. The ventricles are stable in size. Vascular: No hyperdense vessels.  Carotid vascular calcification Skull: No fracture Sinuses/Orbits: No acute finding. Other: None IMPRESSION: 1. No CT evidence for acute intracranial abnormality. 2. Extensive white matter hypodensity as before, thought due to combination of small vessel disease and post treatment change. Cystic appearing area with adjacent encephalomalacia in the anterior right frontal lobe as seen on prior MRI imaging and corresponding to history of treated lesion. Small focus of linear hypodensity at the posterior limb of the right internal capsule at the site of prior treated lesion. No new acute abnormality compared to prior Electronically Signed   By: Luke Bun M.D.   On: 02/19/2024 20:48   DG Chest Port 1 View Result Date: 02/19/2024 EXAM: 1 VIEW(S) XRAY OF THE CHEST 02/19/2024 07:18:00 PM COMPARISON: 12/05/2022. CLINICAL HISTORY: Altered mental status and possible sepsis. FINDINGS: LINES, TUBES AND DEVICES: Left chest wall ICD. LUNGS AND PLEURA: Vascular congestion with mild interstitial edema. Small right  pleural effusion. No focal pulmonary opacity. No pneumothorax. HEART AND MEDIASTINUM: Mild cardiomegaly. Aortic atherosclerosis. Median sternotomy wires and mediastinal surgical clips. BONES AND SOFT TISSUES: No acute osseous abnormality. IMPRESSION: 1. Vascular congestion with mild interstitial edema and small right pleural effusion. 2. Mild cardiomegaly. Electronically signed by: Oneil Devonshire MD 02/19/2024 07:26 PM EST RP Workstation: [id number]   PROCEDURES: Critical Care performed: No Procedures MEDICATIONS ORDERED IN ED: Medications  lactated ringers  infusion (has no administration in time range)  lactated ringers  bolus 1,000 mL (0 mLs Intravenous Stopped 02/19/24 1951)    And  lactated ringers  bolus 1,000 mL (0 mLs Intravenous Stopped 02/19/24 2139)    And  lactated ringers  bolus 250 mL (has no administration in time range)  lactated ringers  bolus 1,000 mL (has no administration in time range)  cefTRIAXone  (ROCEPHIN ) 2 g in sodium chloride  0.9 % 100 mL IVPB (0 g Intravenous Stopped 02/19/24 1951)   IMPRESSION / MDM /  ASSESSMENT AND PLAN / ED COURSE  I reviewed the triage vital signs and the nursing notes.                             The patient is on the cardiac monitor to evaluate for evidence of arrhythmia and/or significant heart rate changes. Patient's presentation is most consistent with acute presentation with potential threat to life or bodily function. Patient is an 79 year old male with the above-stated past medical history who presents complaining of altered mental status by caregiver DDx: Sepsis, UTI, brain metastasis with vasogenic edema, seizure Plan: Sepsis order set initiated upon arrival ABX: Rocephin  IVF: 30 cc/kg LR  Patient's radiologic and laboratory evaluation did not show any evidence of acute abnormalities.  Patient's mental status and vital signs improved throughout hydration.  Upon reassessment, patient's caregiver does state that he only had 1 episode of  micturition today and admits that patient has not had been adequately hydrating.  It seems that patient had abnormal vital signs including tachycardia and low blood pressure upon arrival secondary to dehydration.  Patient encouraged to rehydrate with water /electrolyte solution and patient as well as caregiver expressed understanding.  Patient and caregiver in agreement with plan for discharge at this time with outpatient follow-up as needed.  Given strict return precautions and all questions were answered prior to discharge  Dispo: Discharge home with PCP follow-up as needed   FINAL CLINICAL IMPRESSION(S) / ED DIAGNOSES   Final diagnoses:  Dehydration  Transient alteration of awareness   Rx / DC Orders   ED Discharge Orders     None      Note:  This document was prepared using Dragon voice recognition software and may include unintentional dictation errors.   Bintou Lafata K, MD 02/19/24 2333  "

## 2024-02-19 NOTE — ED Notes (Signed)
 Dinner tray given to pt.  Pt alert.

## 2024-02-19 NOTE — Sepsis Progress Note (Signed)
 Elink monitoring for the code sepsis protocol.

## 2024-02-19 NOTE — ED Triage Notes (Signed)
 Pt brought in via ems from [facility].  Pt lives at home.  Win motel due to [employer] conditions.  Pt brought to for ams.  Pt has testicular cancer with mets to brain per ems.  Recent uti per ems.  Pt confused.  Pt denies any pain.  No chest pain or sob.  Pt had biopsy of nose for skin cancer.  Pt alert

## 2024-02-19 NOTE — ED Notes (Addendum)
 Nurse Amy C. Notified of BP reading and map of 62. VRBO. ED provider notified, no new orders obtained at this time.

## 2024-02-19 NOTE — Sepsis Progress Note (Signed)
 Notified bedside nurse of need to draw repeat lactic acid. ED RN notified via secure chat.

## 2024-02-19 NOTE — Consult Note (Signed)
 CODE SEPSIS - PHARMACY COMMUNICATION  **Broad Spectrum Antibiotics should be administered within 1 hour of Sepsis diagnosis**  Time Code Sepsis Called/Page Received: 1906  Antibiotics Ordered: ceftriaxone   Time of 1st antibiotic administration: 1921  Additional action taken by pharmacy: N/A   Kayla JULIANNA Blew ,PharmD Clinical Pharmacist  02/19/2024  7:07 PM

## 2024-02-20 ENCOUNTER — Ambulatory Visit: Admitting: Emergency Medicine

## 2024-02-20 NOTE — Progress Notes (Unsigned)
" °  Cardiology Office Note:    Date:  02/20/2024  ID:  Roy [name], DOB 1945/10/25, MRN [id number] PCP: Gerome Brunet, DO  [facility] Providers Cardiologist:  Darryle ONEIDA Decent, MD { Click to update primary MD,subspecialty MD or APP then REFRESH:1}    {Click to Open Review  :1}   Patient Profile:       Chief Complaint: *** History of Present Illness:  Roy [name] is a 79 y.o. male with visit-pertinent history of coronary artery disease s/p CABG, vasovagal syncope, systolic heart failure, hyperlipidemia, CKD  He has history of three-vessel coronary artery disease s/p CABG x 4 (LIMA-LAD, RIMA-PDA, radial artery OM1/2) on 03/2020.  Echocardiogram 02/2020 showed LVEF 25 to 30%, grade 1 DD, RV function and size normal, mild mitral valve regurgitation.  Echocardiogram 04/2020 showed LVEF 20 to 25%.  Echocardiogram 10/2020 showed LVEF 20 to 25%.  Cardiac MRI 03/2021 showed normal LV size with moderate systolic dysfunction showing LVEF 35% and normal RV size with moderate systolic dysfunction with EF of 37%.  He underwent ICD implant on 07/2021.  Most recent echocardiogram on 09/2022 showed LVEF 25 to 30%, global hypokinesis, grade 1 DD, RV function mildly reduced, RV size normal, no significant valvular abnormalities.  Last seen in clinic by Lamarr, NP on 01/10/2023.  He appeared to be euvolemic and was without anginal symptoms.  No changes were made.  He has a follow-up in 6 months.  Discussed the use of AI scribe software for clinical note transcription with the patient, who gave verbal consent to proceed.  History of Present Illness     Review of systems:  Please see the history of present illness. All other systems are reviewed and otherwise negative. ***      Studies Reviewed:        ***  Risk Assessment/Calculations:   {Does this patient have ATRIAL FIBRILLATION?:[phone]}          Physical Exam:   VS:  There were no vitals taken for this visit.   Wt Readings from Last  3 Encounters:  02/19/24 160 lb 15 oz (73 kg)  01/23/24 162 lb 14.4 oz (73.9 kg)  12/06/23 173 lb 4.8 oz (78.6 kg)    GEN: Well nourished, well developed in no acute distress NECK: No JVD; No carotid bruits CARDIAC: ***RRR, no murmurs, rubs, gallops RESPIRATORY:  Clear to auscultation without rales, wheezing or rhonchi  ABDOMEN: Soft, non-tender, non-distended EXTREMITIES:  No edema; No acute deformity ***      Assessment and Plan:    Assessment and Plan Assessment & Plan      {Are you ordering a CV Procedure (e.g. stress test, cath, DCCV, TEE, etc)?   Press F2        :[id number]}  Dispo:  No follow-ups on file.  Signed, Lum LITTIE Louis, NP  "

## 2024-02-21 ENCOUNTER — Telehealth: Payer: Self-pay | Admitting: Radiation Oncology

## 2024-02-21 NOTE — Progress Notes (Signed)
 Location/Histology of Brain Tumor:  CNS Lymphoma  Patient presented with symptoms of:   Short term memory loss and fatigue. He has also been experiencing occasional episodes of loss of function on both legs. Gait is unsteady   Past or anticipated interventions, if any, per neurosurgery:   Past or anticipated interventions, if any, per medical oncology:  01/23/2024 Buckley, MD  02/19/2024 CT Head Wo Contrast    Dose of Decadron , if applicable: None  Recent neurologic symptoms, if any:  Seizures: None Headaches: Occasional headaches Nausea: None Dizziness/ataxia: Occasional dizziness Difficulty with hand coordination: Yes Focal numbness/weakness: Having generalized weakness Visual deficits/changes: None Confusion/Memory deficits: None  Painful bone metastases at present, if any: None  SAFETY ISSUES: Prior radiation? Yes Pacemaker/ICD? Defibrillator Possible current pregnancy? None Is the patient on methotrexate ? None  Additional Complaints / other details: None

## 2024-02-21 NOTE — Telephone Encounter (Signed)
 Spoke to pt's wife regarding referral and appt. Wife advised at this time, pt is resting and is unsure if he will want to be seen. Pt was previously treated for brain tumor and, according to wife, nero-onc advised seeing damage to brain even with improvement to tumor. Pt has issues with short-term memory and is in a lot of residual pain. They are both very hesitant about moving forward. She said pt is resting but she will ask him how he would like to proceed before agreeing to appt. Best c/b number was provided, she stated she would call back before the end of the week. I advised if I don't get a call, I would be reaching out 2/2 to f/u, she was agreeable to this.

## 2024-02-21 NOTE — Telephone Encounter (Signed)
 Pt's wife returned call stating she checked in with pt who was agreeable to consultation. Due to possible inclement weather, appt scheduled for 2/4 @ 3:30pm. Pt's wife was appreciative of flexibility and patience as they navigate this possible course of treatment.

## 2024-02-23 ENCOUNTER — Ambulatory Visit
Attending: Student in an Organized Health Care Education/Training Program | Admitting: Student in an Organized Health Care Education/Training Program

## 2024-02-23 ENCOUNTER — Encounter: Payer: Self-pay | Admitting: Student in an Organized Health Care Education/Training Program

## 2024-02-23 VITALS — BP 90/64 | HR 100 | Ht 72.0 in | Wt 161.8 lb

## 2024-02-23 DIAGNOSIS — I428 Other cardiomyopathies: Secondary | ICD-10-CM

## 2024-02-23 LAB — CUP PACEART INCLINIC DEVICE CHECK
Brady Statistic RV Percent Paced: 0 %
Date Time Interrogation Session: 20260130134845
Implantable Lead Connection Status: 753985
Implantable Lead Implant Date: 20230731
Implantable Lead Location: 753860
Implantable Lead Model: 436909
Implantable Lead Serial Number: 81532339
Implantable Pulse Generator Implant Date: 20230731
Pulse Gen Model: 429525
Pulse Gen Serial Number: 84895112

## 2024-02-23 NOTE — Progress Notes (Unsigned)
" °  Cardiology Office Note   Date:  02/23/2024  ID:  Roy, [name] 1945-08-27, MRN [id number] PCP: Gerome Brunet, DO  [facility] Providers Cardiologist:  Darryle ONEIDA Decent, MD { Click to update primary MD,subspecialty MD or APP then REFRESH:1}    History of Present Illness Roy [name] is a 79 y.o. male ***  ROS: ***  Studies Reviewed      *** Risk Assessment/Calculations {Does this patient have ATRIAL FIBRILLATION?:[phone]}         Physical Exam VS:  BP 90/64   Pulse 100   Ht 6' (1.829 m)   Wt 161 lb 12.8 oz (73.4 kg)   SpO2 99%   BMI 21.94 kg/m        Wt Readings from Last 3 Encounters:  02/23/24 161 lb 12.8 oz (73.4 kg)  02/19/24 160 lb 15 oz (73 kg)  01/23/24 162 lb 14.4 oz (73.9 kg)    GEN: Well nourished, well developed in no acute distress NECK: No JVD; No carotid bruits CARDIAC: ***RRR, no murmurs, rubs, gallops RESPIRATORY:  Clear to auscultation without rales, wheezing or rhonchi  ABDOMEN: Soft, non-tender, non-distended EXTREMITIES:  No edema; No deformity   ASSESSMENT AND PLAN ***    {Are you ordering a CV Procedure (e.g. stress test, cath, DCCV, TEE, etc)?   Press F2        :[id number]}  Dispo: ***  Signed, Donnice DELENA Primus, MD  "

## 2024-02-23 NOTE — Patient Instructions (Signed)
 Medication Instructions:  Your physician recommends that you continue on your current medications as directed. Please refer to the Current Medication list given to you today.  *If you need a refill on your cardiac medications before your next appointment, please call your pharmacy*  Lab Work: None ordered.  If you have labs (blood work) drawn today and your tests are completely normal, you will receive your results only by: MyChart Message (if you have MyChart) OR A paper copy in the mail If you have any lab test that is abnormal or we need to change your treatment, we will call you to review the results.  Testing/Procedures: None ordered.   Follow-Up: At [facility], you and your health needs are our priority.  As part of our continuing mission to provide you with exceptional heart care, our providers are all part of one team.  This team includes your primary Cardiologist (physician) and Advanced Practice Providers or APPs (Physician Assistants and Nurse Practitioners) who all work together to provide you with the care you need, when you need it.  Your next appointment:   12 months with Dr Almetta or PA

## 2024-02-23 NOTE — Progress Notes (Unsigned)
 " Cardiology Office Note   Date: 02/23/24 ID:  Roy [name], Roy [name] [birth year], MRN [id number] PCP: Gerome Brunet, DO  [facility] Providers Cardiologist:  Darryle ONEIDA Decent, MD Electrophysiologist:  Donnice DELENA Primus, MD   History of Present Illness Roy [name] is a 79 y.o. 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   Discussed the use of AI scribe software for clinical note transcription with the patient, who gave verbal consent to proceed. History of Present Illness Roy [name] is an 79 year old male who presents for evaluation of his defibrillator function and memory issues.  He has a history of defibrillator placement and experienced a single shock from the device. He was previously evaluated at [facility] in the ER.  He has a history of a brain tumor, which is no longer present as confirmed by two MRIs, the latest being in December. However he has had short term memory issues following radiation tx. He can recall events from thirty years ago but struggles with recent memory.  He recently had a urinary tract infection for which he completed a course of antibiotics. He was also treated for dehydration at [facility], receiving 3000 cc of lactated Ringer 's solution. All tests, including blood and urine cultures, were negative.  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 A recent biopsy returned positive for squamous cell carcinoma, and he is scheduled for a consultation regarding localized radiation treatment.  His current medications have not changed recently. He has been through chemotherapy in the past and is familiar with the cancer treatment process.  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  ROS: none  Studies Reviewed  ECG review 02/19/24: NSR 92, PR 159, QRS 127, QT/c 421/521, isolated PVC 12/05/22: NSR 79, PR 175, QRS 120, QT/c 446/512, LAFB, PVCs  TTE Result date: 09/29/22  1. Left ventricular ejection fraction, by estimation, is 25 to 30%. Left  ventricular ejection fraction by PLAX is 32 %. The left ventricle has  severely decreased function. The left ventricle demonstrates global  hypokinesis. Left ventricular diastolic  parameters are consistent with Grade I diastolic dysfunction (impaired  relaxation).   2. Right ventricular systolic function is mildly reduced. The right  ventricular size is normal. Tricuspid regurgitation signal is inadequate  for assessing PA pressure.   3. Left atrial size was moderately dilated.   4. The mitral valve is normal in structure. No evidence of mitral valve  regurgitation. No evidence of mitral stenosis.   5. The aortic valve is tricuspid. There is mild calcification of the  aortic valve. Aortic valve regurgitation is not visualized. Aortic valve  sclerosis is present, with no evidence of aortic valve stenosis.   6. There is borderline dilatation of the aortic root, measuring 38 mm.   7. The inferior vena cava is normal in size with greater than 50%  respiratory variability, suggesting right atrial pressure of 3 mmHg.   Physical Exam VS:  BP 90/64   Pulse 100   Ht 6' (1.829 m)   Wt 161 lb 12.8 oz (73.4 kg)   SpO2 99%   BMI 21.94 kg/m   Wt Readings  from Last 3 Encounters:  02/23/24 161 lb 12.8 oz (73.4 kg)  02/19/24 160 lb 15 oz (73 kg)  01/23/24 162 lb 14.4 oz (73.9 kg)    GEN: Well nourished, well developed in no acute distress NECK: No JVD; No carotid bruits CARDIAC: RRR, no murmurs, rubs, gallops RESPIRATORY:  Clear to auscultation without rales, wheezing or rhonchi  ABDOMEN: Soft, non-tender, non-distended EXTREMITIES:  No edema; No  deformity  SKIN: LEFT infraclavicular incision well healed, bridge of nose with ulcerated lesion  ASSESSMENT AND PLAN Roy [name] is a 79 y.o. 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  Assessment & Plan Nonischemic cardiomyopathy with implantable defibrillator Defibrillator functioning well, one shock for  ventricular tachycardia, no recurrence. No additional medications unless recurrent shocks occur. I would have a low threshold to disable therapies if he has recurrent shocks vs just adding amiodarone . His functional status appears to be declining based off my evaluation today.  - Monitor defibrillator function, report recurrent shocks or symptoms. - Review device reports every three months.  History of brain tumor with encephalomalacia Brain tumor resolved, wife concerned that post radiation therapy there has been an obvious decline in his memory   Squamous cell carcinoma of the nose, planned radiation therapy Biopsy confirmed squamous cell carcinoma, scheduled for radiation therapy consultation. - Attend consultation with Dr. Izell for radiation therapy planning.   Dispo: RTC 1 yr  Signed, Donnice DELENA Primus, MD  "

## 2024-02-24 LAB — CULTURE, BLOOD (ROUTINE X 2)
Culture: NO GROWTH
Culture: NO GROWTH
Special Requests: ADEQUATE

## 2024-02-28 ENCOUNTER — Encounter: Payer: Self-pay | Admitting: Student in an Organized Health Care Education/Training Program

## 2024-02-28 ENCOUNTER — Ambulatory Visit
Admission: RE | Admit: 2024-02-28 | Discharge: 2024-02-28 | Disposition: A | Source: Ambulatory Visit | Attending: Radiation Oncology | Admitting: Radiation Oncology

## 2024-02-28 ENCOUNTER — Ambulatory Visit
Admission: RE | Admit: 2024-02-28 | Discharge: 2024-02-28 | Disposition: A | Source: Ambulatory Visit | Attending: Radiation Oncology

## 2024-02-28 VITALS — BP 95/72 | HR 94 | Temp 97.6°F | Resp 24 | Ht 72.0 in | Wt 157.0 lb

## 2024-02-28 DIAGNOSIS — C44301 Unspecified malignant neoplasm of skin of nose: Secondary | ICD-10-CM | POA: Insufficient documentation

## 2024-02-28 NOTE — Progress Notes (Signed)
 " Radiation Oncology         [phone] ________________________________  Initial outpatient Consultation  Name: Roy [name] MRN: [id number]  Date: 02/28/2024  DOB: 1945-07-22  RR:Rnoopwd, Lonell, DO  Court Pulling, MD   REFERRING PHYSICIAN: Court Pulling, MD  DIAGNOSIS:     ICD-10-CM   1. Skin cancer of nose  C44.301       Clinical T2N0M0 Squamous cell carcinoma, bridge of nose  HISTORY OF PRESENT ILLNESS::Roy [name] is a 79 y.o. male who presents today for management of Squamous cell carcinoma, bridge of nose. The patient was originally diagnosed with left-sided primary testicular large B-cell lymphoma in 2022. He underwent an ipsilateral orchiectomy and adjuvant R-CEOP and intrathecal methotrexate  along with radiation. 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 Patrcia. He was last seen in office on 12/12/22 for his reconsultation by Dr. Patrcia.   On 02/19/24, patient presented to the ED via EMS after experiencing an episode of slurred speech. A head CT was performed at that time showing no evidence for acute intracranial abnormality or changes since his previous MRI. A chest x-ray was also performed and showed vascular congestion with mild interstitial edema and small right pleural effusion. Patient's mental status and vital signs improved throughout hydration and therefore discharged with referrals placed.    Most recent MRI of the brain was performed on 01/09/24 demonstrated stable appearance of treated lesions in the right frontal lobe and the posterior limb of the right internal capsule with no new intracranial lesions. He is scheduled to undergo a brain MRI.  During a recent call with our office on 02/21/24, patient wife reported that he was previously treated for brain tumor and, according to wife, neuro-onc advised seeing damage to brain even with improvement to tumor. He  currently has issues with short term memory and residual pain.     More recently, he was seen by Dr Court specially for a lesion on his nose - squamous cell carcinoma of the bridge of his nose.    He previously had nose surgery for a lesion  and has not received radiation for skin cancer.  He has lost about 60 pounds since early 2024 per his wife, from 215-220 pounds to 159 pounds, with markedly decreased appetite and eating mainly when prompted by his wife. His wife notes he often feels cold.  He has received prior radiation for testicular and brain cancer by Dr. Patrcia. He has not had radiation to the skin of his nose.  He has chronic sinus issues with a runny nose, nasal irritation, and occasional blood-tinged mucus when blowing his nose, worse in cold weather.  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        PHOTOS FROM DERMATOLOGY:   PREVIOUS RADIATION THERAPY: Yes   Intent: palliative  First Treatment Date: 2022-12-14 Last Treatment Date: 2023-01-09   Plan Name: Brain Site: Brain Technique: 3D Mode: Photon Dose Per Fraction: 2.3 Gy Prescribed Dose (Delivered / Prescribed): 39.1 Gy / 39.1 Gy Prescribed Fxs (Delivered / Prescribed): 17 / 17   Radiation treatment dates:   03/01/21 - 03/23/21 Site/dose:   The right testicle was treated to 30.6 Gy in 17 fractions of 1.8 Gy for purpose of prophylaxis/cure.  PAST MEDICAL HISTORY:  has a past medical history of Allergic rhinitis, Arthritis, Cardiomyopathy, nonischemic (HCC), CHF (congestive heart failure), NYHA class  III (HCC) (03/21/2020), Coronary artery disease, GERD (gastroesophageal reflux disease), Hiatal hernia, History of kidney stones, History of squamous cell carcinoma excision, History of syncope ((03-06-2020 pt stated has not had sycopal episode in few years, stated it seems to happen in extreme hot conditions)), Hyperlipidemia, Hypertension, Hypovitaminosis D, Mass of left testicle,  Nocturia, Plantar fasciitis, Presence of surgical incision, Type 2 diabetes mellitus (HCC), Wears glasses, Wears hearing aid in both ears, and Weight loss (11/16/2020).    PAST SURGICAL HISTORY: Past Surgical History:  Procedure Laterality Date   COLONOSCOPY  last one 01-30-2017   CORONARY ARTERY BYPASS GRAFT N/A 03/25/2020   Procedure: CORONARY ARTERY BYPASS GRAFTING (CABG), ON PUMP, TIMES FOUR, USING BILATERAL INTERNAL MAMMARY ARTERIES AND LEFT RADIAL ARTERY;  Surgeon: German Bartlett PEDLAR, MD;  Location: [facility] OR;  Service: Open Heart Surgery;  Laterality: N/A;   ICD IMPLANT N/A 08/23/2021   Procedure: ICD IMPLANT;  Surgeon: Waddell Danelle ORN, MD;  Location: [facility] INVASIVE CV LAB;  Service: Cardiovascular;  Laterality: N/A;   IR IMAGING GUIDED PORT INSERTION  07/06/2020   LOW ANTERIOR RESECTION RECTUM W/ COLOPROCTOSTOMY  05/2003   MOHS SURGERY  01/2020   nose w/ graft   ORCHIECTOMY Left 03/11/2020   Procedure: CASSAUNDRA FONTANA;  Surgeon: Devere Lonni Righter, MD;  Location: [facility];  Service: Urology;  Laterality: Left;  ONLY NEEDS 60 MIN   PORTA CATH REMOVAL N/A 08/23/2021   Procedure: PORTA CATH REMOVAL;  Surgeon: Waddell Danelle ORN, MD;  Location: [facility] INVASIVE CV LAB;  Service: Cardiovascular;  Laterality: N/A;   RADIAL ARTERY HARVEST Left 03/25/2020   Procedure: LEFT RADIAL ARTERY HARVEST;  Surgeon: German Bartlett PEDLAR, MD;  Location: [facility] OR;  Service: Open Heart Surgery;  Laterality: Left;   RIGHT/LEFT HEART CATH AND CORONARY ANGIOGRAPHY N/A 03/23/2020   Procedure: RIGHT/LEFT HEART CATH AND CORONARY ANGIOGRAPHY;  Surgeon: Dann Candyce RAMAN, MD;  Location: [facility] INVASIVE CV LAB;  Service: Cardiovascular;  Laterality: N/A;   SHOULDER SURGERY Right 1992; 07/ 2021   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 Left 12/24/2008   left ear and nose    TEE WITHOUT CARDIOVERSION N/A 03/25/2020   Procedure: TRANSESOPHAGEAL ECHOCARDIOGRAM (TEE);  Surgeon: German Bartlett PEDLAR, MD;  Location:  [facility] OR;  Service: Open Heart Surgery;  Laterality: N/A;   TONSILLECTOMY AND ADENOIDECTOMY  child   UPPER GASTROINTESTINAL ENDOSCOPY  last one 06-06-2017    FAMILY HISTORY: family history includes Heart attack in his mother; Stroke in his father.  SOCIAL HISTORY:  reports that he quit smoking about 47 years ago. His smoking use included pipe. He has never used smokeless tobacco. He reports current alcohol use. He reports that he does not use drugs.  ALLERGIES: Patient has no known allergies.  MEDICATIONS:  Current Outpatient Medications  Medication Sig Dispense Refill   aspirin  EC 81 MG tablet Take 81 mg by mouth daily. Swallow whole.     B-Complex TABS Take 1 tablet by mouth daily.     Cholecalciferol  (VITAMIN D ) 50 MCG (2000 UT) CAPS Take 2,000 Units by mouth daily. Monday-Friday     empagliflozin  (JARDIANCE ) 10 MG TABS tablet Take 1 tablet (10 mg total) by mouth daily before breakfast. 90 tablet 0   feeding supplement (ENSURE ENLIVE / ENSURE PLUS) LIQD Take 237 mLs by mouth 2 (two) times daily between meals. 237 mL 12   glucosamine-chondroitin 500-400 MG tablet Take 1 tablet by mouth See admin instructions. 5 times weekly     metoprolol   succinate (TOPROL -XL) 50 MG 24 hr tablet Take 1 tablet (50 mg total) by mouth daily. Take with or immediately following a meal. 30 tablet 2   Multiple Vitamin (MULTIVITAMIN WITH MINERALS) TABS tablet Take 1 tablet by mouth See admin instructions. 5 times weekly     omeprazole (PRILOSEC) 20 MG capsule Take 20 mg by mouth daily.     spironolactone  (ALDACTONE ) 25 MG tablet TAKE 1/2 TABLET (12.5 MG TOTAL) BY MOUTH DAILY. 30 tablet 0   No current facility-administered medications for this encounter.    REVIEW OF SYSTEMS:  As above.   PHYSICAL EXAM:  height is 6' (1.829 m) and weight is 157 lb (71.2 kg). His temperature is 97.6 F (36.4 C). His blood pressure is 95/72 and his pulse is 94. His respiration is 24 (abnormal) and oxygen saturation is 100%.    General: Alert and oriented, in no acute distress   HEENT: Head is normocephalic. Extraocular movements are intact.  Bridge of nose - scabbed lesion with surrounding erythema and mild swelling, approx 2cm Neck: Neck is supple, no palpable cervical or supraclavicular lymphadenopathy. Lymphatics: see Neck Exam Skin: see HEENT Musculoskeletal: thin, in a WC Neurologic: Cranial nerves II through XII are grossly intact. He shows memory deficits. Hard of hearing. Psychiatric: pleasant affect, conversant.  ECOG = 3  0 - Asymptomatic (Fully active, able to carry on all predisease activities without restriction)  1 - Symptomatic but completely ambulatory (Restricted in physically strenuous activity but ambulatory and able to carry out work of a light or sedentary nature. For example, light housework, office work)  2 - Symptomatic, <50% in bed during the day (Ambulatory and capable of all self care but unable to carry out any work activities. Up and about more than 50% of waking hours)  3 - Symptomatic, >50% in bed, but not bedbound (Capable of only limited self-care, confined to bed or chair 50% or more of waking hours)  4 - Bedbound (Completely disabled. Cannot carry on any self-care. Totally confined to bed or chair)  5 - Death   Raylene MM, Creech RH, Tormey DC, et al. [phone]). Toxicity and response criteria of the [facility]. Am. DOROTHA Bridges. Oncol. 5 (6): 649-55    LABORATORY DATA:  Lab Results  Component Value Date   WBC 9.5 02/19/2024   HGB 10.8 (L) 02/19/2024   HCT 35.0 (L) 02/19/2024   MCV 82.9 02/19/2024   PLT 249 02/19/2024   CMP     Component Value Date/Time   NA 145 02/19/2024 1913   NA 139 09/02/2022 1000   K 4.2 02/19/2024 1913   CL 112 (H) 02/19/2024 1913   CO2 21 (L) 02/19/2024 1913   GLUCOSE 80 02/19/2024 1913   BUN 26 (H) 02/19/2024 1913   BUN 29 (H) 09/02/2022 1000   CREATININE 1.73 (H) 02/19/2024 1913   CREATININE 1.62 (H) 12/06/2023  1015   CREATININE 1.17 04/23/2020 0000   CALCIUM  9.2 02/19/2024 1913   PROT 6.1 (L) 02/19/2024 1913   ALBUMIN  3.6 02/19/2024 1913   AST 25 02/19/2024 1913   AST 14 (L) 12/06/2023 1015   ALT 27 02/19/2024 1913   ALT 10 12/06/2023 1015   ALKPHOS 79 02/19/2024 1913   BILITOT 1.0 02/19/2024 1913   BILITOT 0.6 12/06/2023 1015   EGFR 31 (L) 09/02/2022 1000   GFRNONAA 39 (L) 02/19/2024 1913   GFRNONAA 43 (L) 12/06/2023 1015         RADIOGRAPHY: CUP PACEART INCLINIC DEVICE  CHECK Result Date: 02/23/2024 Normal in-clinic single chamber ICD check. Presenting Rhythm: AS/VS 100. Routine testing was performed. Thresholds, sensing, and impedance demonstrate stable parameters and no programming changes needed. No clinically significant arrhythmia. Estimated longevity 100%. Pt enrolled in remote follow-up.Anette Shutter, BSN, RN  CT Head Wo Contrast Result Date: 02/19/2024 CLINICAL DATA:  Mental status change EXAM: CT HEAD WITHOUT CONTRAST TECHNIQUE: Contiguous axial images were obtained from the base of the skull through the vertex without intravenous contrast. RADIATION DOSE REDUCTION: This exam was performed according to the departmental dose-optimization program which includes automated exposure control, adjustment of the mA and/or kV according to patient size and/or use of iterative reconstruction technique. COMPARISON:  MRI 01/09/2024, 10/03/2023, 12/08/2022, CT brain 12/12/2022 FINDINGS: Brain: Extensive white matter hypodensity as before. Cystic appearing area with adjacent encephalomalacia in the anterior right frontal lobe as seen on the prior MRI imaging. Small focus of linear hypodensity at the posterior limb of the right internal capsule at the site of prior lesion. No new mass, mass effect or midline shift. The ventricles are stable in size. Vascular: No hyperdense vessels.  Carotid vascular calcification Skull: No fracture Sinuses/Orbits: No acute finding. Other: None IMPRESSION: 1. No CT  evidence for acute intracranial abnormality. 2. Extensive white matter hypodensity as before, thought due to combination of small vessel disease and post treatment change. Cystic appearing area with adjacent encephalomalacia in the anterior right frontal lobe as seen on prior MRI imaging and corresponding to history of treated lesion. Small focus of linear hypodensity at the posterior limb of the right internal capsule at the site of prior treated lesion. No new acute abnormality compared to prior Electronically Signed   By: Luke Bun M.D.   On: 02/19/2024 20:48   DG Chest Port 1 View Result Date: 02/19/2024 EXAM: 1 VIEW(S) XRAY OF THE CHEST 02/19/2024 07:18:00 PM COMPARISON: 12/05/2022. CLINICAL HISTORY: Altered mental status and possible sepsis. FINDINGS: LINES, TUBES AND DEVICES: Left chest wall ICD. LUNGS AND PLEURA: Vascular congestion with mild interstitial edema. Small right pleural effusion. No focal pulmonary opacity. No pneumothorax. HEART AND MEDIASTINUM: Mild cardiomegaly. Aortic atherosclerosis. Median sternotomy wires and mediastinal surgical clips. BONES AND SOFT TISSUES: No acute osseous abnormality. IMPRESSION: 1. Vascular congestion with mild interstitial edema and small right pleural effusion. 2. Mild cardiomegaly. Electronically signed by: Oneil Devonshire MD 02/19/2024 07:26 PM EST RP Workstation: [id number]      IMPRESSION/PLAN:  This is a delightful patient with skin cancer of the nose.   It was a pleasure meeting the patient today. I recommend electron radiotherapy.   We discussed the nature of squamous cell carcinoma and the role radiotherapy plays in treatment today. We reviewed the benefits, risks, and possible side effects from the treatment. Possible side effects include fatigue, watering of eyes, and skin irritation with peeling. Mucosal irritation to the nostril is also likely. Some permanent skin changes may occur, but cosmesis is generally favorable after radiation therapy.   Nonhealing wound to nose is rare.  Patient expressed understanding of the treatment which is of curative intent. All questions were answered to their stated satisfaction. No guarantees of treatment given. A consent form was signed and placed in their chart today.   We will schedule the patient for CT simulation. Anticipate treatment twice a week for 5 weeks - I will hypofractionate his treatment to account for his performance status and in effort to make treatment realistic and manageable for him and his wife.   On date of service, in  total, I spent 45 minutes on this encounter. Patient was seen in person.   __________________________________________   Lauraine Golden, MD  This document serves as a record of services personally performed by Lauraine Golden, MD. It was created on her behalf by Reymundo Cartwright, a trained medical scribe. The creation of this record is based on the scribe's personal observations and the provider's statements to them. This document has been checked and approved by the attending provider.  "

## 2024-02-28 NOTE — Progress Notes (Signed)
 TO BE COMPLETED BY RADIATION ONCOLOGIST OFFICE:   Patient Name: Roy [name]   Date of Birth: 11-06-1945   Radiation Oncologist: Dr. Lauraine Golden   Site to be Treated: Skin of Nose   Will x-rays >10 MV be used? None   Will the radiation be >10 cm from the device? TBD   Planned Treatment Start Date:  NOT NOTED  TO BE COMPLETED BY CARDIOLOGIST OFFICE:   Device Information:  Pacemaker []      ICD [x]    Brand: [employer]: [phone] Model #: Acticor 7 VR-T DX  Serial Number: [id number]     Date of Placement: 08/23/2021  Site of Placement: left upper chest  Remote Device Check--Frequency: q 91 days   Last Check: 02/23/2024  Is the Patient Pacer Dependent?:  Yes []   No [x]   Does cardiologist request Radiation Oncology to schedule device testing by vendor for the following:  Prior to the Initiation of Treatments?  Yes []  No [x]  During Treatments?  Yes []  No [x]  Post Radiation Treatments?  Yes []  No [x]   Is device monitoring necessary by vendor/cardiologist team during treatments?  Yes []   No [x]   Is cardiac monitoring by Radiation Oncology nursing necessary during treatments? Yes []   No [x]   Do you recommend device be relocated prior to Radiation Treatment? Yes []   No [x]   **PLEASE LIST ANY NOTES OR SPECIAL REQUESTS: [employer] pacemaker sends remotes nightly-so will be aware in real time if any alerts.      CARDIOLOGIST SIGNATURE:  Dr. Donnice Primus Per Device Clinic Standing Orders, Delon DELENA Sharps  02/28/2024 4:28 PM  **Please route completed form back to Radiation Oncology Nursing and P [facility] RAD ONC ADMIN, OR send an update if there will be a delay in having form completed by expected start date.  **Call [phone] if you have any questions or do not get an in-basket response from a Radiation Oncology staff member

## 2024-02-28 NOTE — Progress Notes (Signed)
 Oncology Nurse Navigator Documentation   Met with patient during initial consult with Dr. Izell. He was accompanied by his wife, Reena. I introduced myself as his/their Navigator, explained my role as a member of the Care Team. Assisted with post-consult appt scheduling. I provided my direct contact information and encouraged them to call me at anytime with questions or concerns.  They verbalized understanding of information provided. I encouraged them to call with questions/concerns moving forward.  Roy Jefferson, RN, BSN, OCN Head & Neck Oncology Nurse Navigator [facility] [phone]

## 2024-02-29 LAB — CUP PACEART REMOTE DEVICE CHECK
Date Time Interrogation Session: 20260205081437
Implantable Lead Connection Status: 753985
Implantable Lead Implant Date: 20230731
Implantable Lead Location: 753860
Implantable Lead Model: 436909
Implantable Lead Serial Number: 81532339
Implantable Pulse Generator Implant Date: 20230731
Pulse Gen Model: 429525
Pulse Gen Serial Number: 84895112

## 2024-03-01 ENCOUNTER — Encounter: Payer: Self-pay | Admitting: Cardiovascular Disease

## 2024-03-01 ENCOUNTER — Ambulatory Visit: Admitting: Cardiovascular Disease

## 2024-03-01 VITALS — BP 94/62 | HR 52 | Ht 72.0 in | Wt 161.0 lb

## 2024-03-01 DIAGNOSIS — E782 Mixed hyperlipidemia: Secondary | ICD-10-CM

## 2024-03-01 DIAGNOSIS — I10 Essential (primary) hypertension: Secondary | ICD-10-CM

## 2024-03-01 DIAGNOSIS — I5022 Chronic systolic (congestive) heart failure: Secondary | ICD-10-CM

## 2024-03-01 DIAGNOSIS — I25810 Atherosclerosis of coronary artery bypass graft(s) without angina pectoris: Secondary | ICD-10-CM

## 2024-03-01 MED ORDER — METOPROLOL SUCCINATE ER 25 MG PO TB24: 25.0000 mg | ORAL_TABLET | Freq: Every day | ORAL | 3 refills | Status: AC

## 2024-03-01 NOTE — Patient Instructions (Signed)
 Medication Instructions:  Your physician has recommended you make the following change in your medication:   -Stop taking Spironolactone  (aldactone ).  -Reduce Metoprolol  succinate (Toprol  XL) 25mg  once daily.  *If you need a refill on your cardiac medications before your next appointment, please call your pharmacy*   Follow-Up: At [facility], you and your health needs are our priority.  As part of our continuing mission to provide you with exceptional heart care, our providers are all part of one team.  This team includes your primary Cardiologist (physician) and Advanced Practice Providers or APPs (Physician Assistants and Nurse Practitioners) who all work together to provide you with the care you need, when you need it.  Your next appointment:   6 month(s)  Provider:   Darryle ONEIDA Decent, MD    We recommend signing up for the patient portal called MyChart.  Sign up information is provided on this After Visit Summary.  MyChart is used to connect with patients for Virtual Visits (Telemedicine).  Patients are able to view lab/test results, encounter notes, upcoming appointments, etc.  Non-urgent messages can be sent to your provider as well.   To learn more about what you can do with MyChart, go to [email or web address].   Other Instructions

## 2024-03-08 ENCOUNTER — Ambulatory Visit: Admitting: Radiation Oncology

## 2024-04-03 ENCOUNTER — Inpatient Hospital Stay: Admitting: Hematology

## 2024-04-03 ENCOUNTER — Inpatient Hospital Stay: Attending: Hematology

## 2024-05-27 ENCOUNTER — Inpatient Hospital Stay: Attending: Hematology | Admitting: Internal Medicine
